# Patient Record
Sex: Male | Born: 1960 | Race: White | Hispanic: No | Marital: Married | Smoking: Current every day smoker
Health system: Southern US, Community
[De-identification: ages and names within clinical notes are randomized; demographics above are authoritative.]

## PROBLEM LIST (undated history)

## (undated) DIAGNOSIS — I739 Peripheral vascular disease, unspecified: Secondary | ICD-10-CM

## (undated) DIAGNOSIS — I2510 Atherosclerotic heart disease of native coronary artery without angina pectoris: Secondary | ICD-10-CM

## (undated) DIAGNOSIS — J449 Chronic obstructive pulmonary disease, unspecified: Secondary | ICD-10-CM

## (undated) DIAGNOSIS — E119 Type 2 diabetes mellitus without complications: Secondary | ICD-10-CM

## (undated) DIAGNOSIS — Z951 Presence of aortocoronary bypass graft: Secondary | ICD-10-CM

## (undated) DIAGNOSIS — J189 Pneumonia, unspecified organism: Secondary | ICD-10-CM

## (undated) DIAGNOSIS — D649 Anemia, unspecified: Secondary | ICD-10-CM

## (undated) DIAGNOSIS — Z789 Other specified health status: Secondary | ICD-10-CM

## (undated) DIAGNOSIS — I219 Acute myocardial infarction, unspecified: Secondary | ICD-10-CM

## (undated) DIAGNOSIS — K259 Gastric ulcer, unspecified as acute or chronic, without hemorrhage or perforation: Secondary | ICD-10-CM

## (undated) DIAGNOSIS — E785 Hyperlipidemia, unspecified: Secondary | ICD-10-CM

## (undated) DIAGNOSIS — M1990 Unspecified osteoarthritis, unspecified site: Secondary | ICD-10-CM

## (undated) DIAGNOSIS — I10 Essential (primary) hypertension: Secondary | ICD-10-CM

## (undated) DIAGNOSIS — I499 Cardiac arrhythmia, unspecified: Secondary | ICD-10-CM

## (undated) DIAGNOSIS — R291 Meningismus: Secondary | ICD-10-CM

## (undated) DIAGNOSIS — N183 Chronic kidney disease, stage 3 unspecified: Secondary | ICD-10-CM

## (undated) DIAGNOSIS — I890 Lymphedema, not elsewhere classified: Secondary | ICD-10-CM

## (undated) DIAGNOSIS — I255 Ischemic cardiomyopathy: Secondary | ICD-10-CM

## (undated) DIAGNOSIS — G709 Myoneural disorder, unspecified: Secondary | ICD-10-CM

## (undated) DIAGNOSIS — N184 Chronic kidney disease, stage 4 (severe): Secondary | ICD-10-CM

## (undated) DIAGNOSIS — G629 Polyneuropathy, unspecified: Secondary | ICD-10-CM

## (undated) DIAGNOSIS — G43909 Migraine, unspecified, not intractable, without status migrainosus: Secondary | ICD-10-CM

## (undated) DIAGNOSIS — G4730 Sleep apnea, unspecified: Secondary | ICD-10-CM

## (undated) DIAGNOSIS — K635 Polyp of colon: Secondary | ICD-10-CM

## (undated) DIAGNOSIS — H3500 Unspecified background retinopathy: Secondary | ICD-10-CM

## (undated) DIAGNOSIS — I5020 Unspecified systolic (congestive) heart failure: Secondary | ICD-10-CM

## (undated) DIAGNOSIS — K219 Gastro-esophageal reflux disease without esophagitis: Secondary | ICD-10-CM

## (undated) DIAGNOSIS — I509 Heart failure, unspecified: Secondary | ICD-10-CM

## (undated) DIAGNOSIS — K859 Acute pancreatitis without necrosis or infection, unspecified: Secondary | ICD-10-CM

## (undated) DIAGNOSIS — N189 Chronic kidney disease, unspecified: Secondary | ICD-10-CM

## (undated) DIAGNOSIS — E109 Type 1 diabetes mellitus without complications: Secondary | ICD-10-CM

## (undated) DIAGNOSIS — E860 Dehydration: Secondary | ICD-10-CM

## (undated) HISTORY — PX: WRIST SURGERY: SHX841

## (undated) HISTORY — DX: Acute pancreatitis without necrosis or infection, unspecified: K85.90

## (undated) HISTORY — PX: BYPASS, CAROTID-SUBCLAVIAN: SHX3361

## (undated) HISTORY — DX: Dehydration: E86.0

## (undated) HISTORY — PX: EYE SURGERY: SHX253

## (undated) HISTORY — DX: Chronic kidney disease, unspecified: N18.9

## (undated) HISTORY — DX: Meningismus: R29.1

## (undated) HISTORY — DX: Migraine, unspecified, not intractable, without status migrainosus: G43.909

## (undated) HISTORY — PX: CARDIAC CATHETERIZATION: SHX172

## (undated) HISTORY — DX: Sleep apnea, unspecified: G47.30

## (undated) HISTORY — PX: BONE GRAFT: SHX377

## (undated) HISTORY — DX: Type 1 diabetes mellitus without complications: E10.9

## (undated) HISTORY — DX: Polyneuropathy, unspecified: G62.9

## (undated) HISTORY — DX: Unspecified systolic (congestive) heart failure: I50.20

## (undated) HISTORY — DX: Type 2 diabetes mellitus without complications: E11.9

## (undated) HISTORY — DX: Essential (primary) hypertension: I10

## (undated) HISTORY — DX: Atherosclerotic heart disease of native coronary artery without angina pectoris: I25.10

## (undated) HISTORY — DX: Cardiac arrhythmia, unspecified: I49.9

## (undated) HISTORY — DX: Hyperlipidemia, unspecified: E78.5

## (undated) HISTORY — DX: Unspecified osteoarthritis, unspecified site: M19.90

## (undated) HISTORY — DX: Gastric ulcer, unspecified as acute or chronic, without hemorrhage or perforation: K25.9

## (undated) HISTORY — DX: Heart failure, unspecified: I50.9

## (undated) HISTORY — DX: Lymphedema, not elsewhere classified: I89.0

## (undated) HISTORY — DX: Peripheral vascular disease, unspecified: I73.9

## (undated) HISTORY — DX: Unspecified background retinopathy: H35.00

## (undated) HISTORY — DX: Chronic kidney disease, stage 3 unspecified: N18.30

## (undated) HISTORY — PX: COLONOSCOPY: SHX174

## (undated) HISTORY — DX: Gastro-esophageal reflux disease without esophagitis: K21.9

## (undated) HISTORY — PX: CATARACT EXTRACTION: SUR2

## (undated) HISTORY — PX: CARDIAC SURGERY: SHX584

## (undated) HISTORY — DX: Ischemic cardiomyopathy: I25.5

## (undated) HISTORY — DX: Polyp of colon: K63.5

## (undated) HISTORY — PX: OTHER SURGICAL HISTORY: SHX169

---
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
## 2009-10-21 DIAGNOSIS — I2510 Atherosclerotic heart disease of native coronary artery without angina pectoris: Secondary | ICD-10-CM | POA: Insufficient documentation

## 2010-11-22 DIAGNOSIS — G4730 Sleep apnea, unspecified: Secondary | ICD-10-CM | POA: Insufficient documentation

## 2011-02-22 DIAGNOSIS — E782 Mixed hyperlipidemia: Secondary | ICD-10-CM | POA: Insufficient documentation

## 2011-02-22 DIAGNOSIS — G43909 Migraine, unspecified, not intractable, without status migrainosus: Secondary | ICD-10-CM | POA: Insufficient documentation

## 2011-02-22 DIAGNOSIS — E1142 Type 2 diabetes mellitus with diabetic polyneuropathy: Secondary | ICD-10-CM | POA: Insufficient documentation

## 2011-02-22 DIAGNOSIS — F418 Other specified anxiety disorders: Secondary | ICD-10-CM | POA: Insufficient documentation

## 2011-02-22 DIAGNOSIS — E1042 Type 1 diabetes mellitus with diabetic polyneuropathy: Secondary | ICD-10-CM | POA: Insufficient documentation

## 2011-02-22 DIAGNOSIS — E7849 Other hyperlipidemia: Secondary | ICD-10-CM | POA: Insufficient documentation

## 2011-05-28 HISTORY — PX: CARDIAC CATHETERIZATION: SHX172

## 2011-10-06 DIAGNOSIS — L0390 Cellulitis, unspecified: Secondary | ICD-10-CM | POA: Insufficient documentation

## 2011-10-14 DIAGNOSIS — M71129 Other infective bursitis, unspecified elbow: Secondary | ICD-10-CM | POA: Insufficient documentation

## 2012-12-09 DIAGNOSIS — E11621 Type 2 diabetes mellitus with foot ulcer: Secondary | ICD-10-CM | POA: Insufficient documentation

## 2012-12-22 ENCOUNTER — Other Ambulatory Visit: Payer: Self-pay

## 2012-12-22 LAB — CBC WITH DIFFERENTIAL/PLATELET
Eosinophil #: 0.8 10*3/uL — ABNORMAL HIGH (ref 0.0–0.7)
Eosinophil %: 9.1 %
HCT: 42.5 % (ref 40.0–52.0)
Lymphocyte %: 32.2 %
Monocyte %: 6.6 %
Neutrophil #: 4.2 10*3/uL (ref 1.4–6.5)
Platelet: 243 10*3/uL (ref 150–440)
RDW: 13.7 % (ref 11.5–14.5)
WBC: 8.4 10*3/uL (ref 3.8–10.6)

## 2012-12-22 LAB — CK: CK, Total: 168 U/L (ref 35–232)

## 2012-12-22 LAB — CREATININE, SERUM
Creatinine: 1.2 mg/dL (ref 0.60–1.30)
EGFR (African American): >60

## 2012-12-22 LAB — BUN: BUN: 20 mg/dL — ABNORMAL HIGH (ref 7–18)

## 2012-12-29 ENCOUNTER — Other Ambulatory Visit: Payer: Self-pay

## 2012-12-29 LAB — CBC WITH DIFFERENTIAL/PLATELET
Basophil %: 1.7 %
Eosinophil #: 0.6 10*3/uL (ref 0.0–0.7)
Eosinophil %: 5.8 %
HGB: 15.1 g/dL (ref 13.0–18.0)
Lymphocyte #: 3.6 10*3/uL (ref 1.0–3.6)
Lymphocyte %: 36.7 %
MCH: 32.1 pg (ref 26.0–34.0)
MCV: 91 fL (ref 80–100)
Monocyte %: 7.6 %
Neutrophil %: 48.2 %
Platelet: 260 10*3/uL (ref 150–440)
RBC: 4.7 10*6/uL (ref 4.40–5.90)
RDW: 13.7 % (ref 11.5–14.5)
WBC: 9.9 10*3/uL (ref 3.8–10.6)

## 2012-12-29 LAB — BUN: BUN: 26 mg/dL — ABNORMAL HIGH (ref 7–18)

## 2012-12-29 LAB — CREATININE, SERUM: Creatinine: 0.97 mg/dL (ref 0.60–1.30)

## 2012-12-29 LAB — CK: CK, Total: 206 U/L (ref 35–232)

## 2016-05-27 HISTORY — PX: CORONARY ARTERY BYPASS GRAFT: SHX141

## 2016-05-27 HISTORY — PX: CARDIAC CATHETERIZATION: SHX172

## 2016-10-28 HISTORY — PX: PERIPHERAL VASCULAR CATHETERIZATION: SHX172C

## 2016-11-15 DIAGNOSIS — I2510 Atherosclerotic heart disease of native coronary artery without angina pectoris: Secondary | ICD-10-CM | POA: Insufficient documentation

## 2017-10-22 ENCOUNTER — Ambulatory Visit (INDEPENDENT_AMBULATORY_CARE_PROVIDER_SITE_OTHER): Payer: Self-pay | Admitting: Cardiology

## 2017-10-24 ENCOUNTER — Other Ambulatory Visit: Payer: Self-pay | Admitting: Cardiology

## 2017-10-24 DIAGNOSIS — I255 Ischemic cardiomyopathy: Secondary | ICD-10-CM

## 2017-11-03 ENCOUNTER — Ambulatory Visit
Admission: RE | Admit: 2017-11-03 | Discharge: 2017-11-03 | Disposition: A | Discharge status: Home / Self Care | Payer: No Typology Code available for payment source | Source: Ambulatory Visit | Attending: Cardiology | Admitting: Cardiology

## 2017-11-03 ENCOUNTER — Other Ambulatory Visit (INDEPENDENT_AMBULATORY_CARE_PROVIDER_SITE_OTHER): Payer: Self-pay

## 2017-11-03 DIAGNOSIS — I255 Ischemic cardiomyopathy: Secondary | ICD-10-CM | POA: Insufficient documentation

## 2017-11-03 DIAGNOSIS — I5189 Other ill-defined heart diseases: Secondary | ICD-10-CM | POA: Insufficient documentation

## 2017-11-03 DIAGNOSIS — I2510 Atherosclerotic heart disease of native coronary artery without angina pectoris: Secondary | ICD-10-CM | POA: Insufficient documentation

## 2017-11-04 ENCOUNTER — Other Ambulatory Visit (INDEPENDENT_AMBULATORY_CARE_PROVIDER_SITE_OTHER): Payer: Self-pay | Admitting: Cardiology

## 2017-11-12 ENCOUNTER — Ambulatory Visit (INDEPENDENT_AMBULATORY_CARE_PROVIDER_SITE_OTHER): Payer: Self-pay | Admitting: Physician Assistant

## 2017-11-12 LAB — VAHRT HISTORIC LVEF: Ejection Fraction: 46 %

## 2017-11-14 ENCOUNTER — Encounter (INDEPENDENT_AMBULATORY_CARE_PROVIDER_SITE_OTHER): Payer: Self-pay | Admitting: Student in an Organized Health Care Education/Training Program

## 2017-11-14 ENCOUNTER — Ambulatory Visit (INDEPENDENT_AMBULATORY_CARE_PROVIDER_SITE_OTHER)
Payer: No Typology Code available for payment source | Admitting: Student in an Organized Health Care Education/Training Program

## 2017-11-14 VITALS — BP 131/84 | HR 83 | Temp 97.6°F | Resp 13 | Ht 72.5 in | Wt 191.2 lb

## 2017-11-14 DIAGNOSIS — I2583 Coronary atherosclerosis due to lipid rich plaque: Secondary | ICD-10-CM

## 2017-11-14 DIAGNOSIS — I2510 Atherosclerotic heart disease of native coronary artery without angina pectoris: Secondary | ICD-10-CM

## 2017-11-14 DIAGNOSIS — E7849 Other hyperlipidemia: Secondary | ICD-10-CM

## 2017-11-14 DIAGNOSIS — Z8719 Personal history of other diseases of the digestive system: Secondary | ICD-10-CM

## 2017-11-14 DIAGNOSIS — E1142 Type 2 diabetes mellitus with diabetic polyneuropathy: Secondary | ICD-10-CM

## 2017-11-14 DIAGNOSIS — Z794 Long term (current) use of insulin: Secondary | ICD-10-CM

## 2017-11-14 DIAGNOSIS — T466X5A Adverse effect of antihyperlipidemic and antiarteriosclerotic drugs, initial encounter: Secondary | ICD-10-CM

## 2017-11-14 DIAGNOSIS — M609 Myositis, unspecified: Secondary | ICD-10-CM

## 2017-11-14 LAB — POCT GLYCOSYLATED HEMOGLOBIN (HGB A1C): POCT Hgb A1C: 12.2 % — AB (ref 3.9–5.9)

## 2017-11-14 MED ORDER — INSULIN DETEMIR 100 UNIT/ML SC SOPN
PEN_INJECTOR | SUBCUTANEOUS | 1 refills | Status: DC
Start: 2017-11-14 — End: 2018-04-28

## 2017-11-14 MED ORDER — INSULIN PEN NEEDLE 31G X 8 MM MISC
3 refills | Status: AC
Start: 2017-11-14 — End: ?

## 2017-11-14 MED ORDER — INSULIN LISPRO 100 UNIT/ML SC SOPN
PEN_INJECTOR | SUBCUTANEOUS | 1 refills | Status: DC
Start: 2017-11-14 — End: 2018-08-03

## 2017-11-14 NOTE — Progress Notes (Signed)
Have you seen any specialists/other providers since your last visit with us?    No    Arm preference verified?   Yes    The patient is due for shingles vaccine

## 2017-11-14 NOTE — Progress Notes (Signed)
Chief Complaint   Patient presents with   . Diabetes     pt here for refill his medicine    . Establish Care     pt here for meet New PCP     57 yo Caucasian M , h/o CAD, poorly controlled DM, OSA, Lower ext neuropathy/pancreatitis who just moved from [place] and is here to establish care     Patient is here to FU on chronic medical problems, including    Diabetes Mellitus Type II, Follow-up:   Poorly controlled    Current symptoms/problems include neuropathy   Known diabetic complications: CAD ,   neuropathy , poor tol to neurontin , Lyrica     Current diabetic medications include   levemir 50 units qhs, 40 units qam, Humalog per SSI 2-20 units   He is not able to inc his levemir due to Hypoglycemic episodes he had in the past : 39 while on LEVEMIr 70 units bid, none on the current regimen   Has not tol oral agents in the past     Eye exam current (within one year): yes  Weight trend: decreasing steadily  Prior visit with dietician: yes -   Current diet: in general, a "healthy" diet to the point where he was 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   Yes   entresto         Hypertension: occasionally compliant    - The patient feels this issue is  controlled.  - The patient has been monitoring blood pressure.  - The patient has  been adhering to the recommended no-added-salt diet.  - The patient has not been compliant to the recommended exercise program.  - The patient is  tolerating the current medication regimen well.  - The patient denies orthostasis.  - The patient denies chest pain.  - The patient denies dyspnea.  - The patient c/o long standing lower ext  Edema.  Seen by is cardiologist and recent labs : CMP/lipid were done,  none available during this visit     Hx HLD  Compliant with cholesterol medication  Myalgias,  Muscle aches , bodyaches on all statins , he refuses to start statin   Now taking Zetia   Compliant with  low saturated and low carbohydrate Diet  No hx abnormal liver test related to statin therapy           Vitals:    11/14/17 1129   BP: 131/84   Pulse: 83   Resp: 13   Temp: 97.6 F (36.4 C)   SpO2: 98%   Weight: 86.7 kg (191 lb 3.2 oz)   Height: 1.842 m (6' 0.5")       Patient Active Problem List   Diagnosis   . Coronary atherosclerosis       Past Medical History:   Diagnosis Date   . Dehydration    . Dupre's syndrome    . Migraine    . Neuropathy    . Pancreatitis    . Sleep apnea        Past Surgical History:   Procedure Laterality Date   . BONE GRAFT     . BYPASS, CAROTID-SUBCLAVIAN     . CARDIAC CATHETERIZATION     . EYE SURGERY     . WRIST SURGERY         Social History     Social History   .  Marital status: Married     Spouse name: N/A   . Number of children: N/A   . Years of education: N/A     Occupational History   . Not on file.     Social History Main Topics   . Smoking status: Former Games developer   . Smokeless tobacco: Never Used      Comment: quit 7 years ago    . Alcohol use 0.6 oz/week     1 Cans of beer per week      Comment: 1 bear q1month    . Drug use: No   . Sexual activity: No     Other Topics Concern   . Not on file     Social History Narrative   . No narrative on file       No family history on file.    Allergies   Allergen Reactions   . Statins      Other reaction(s): Arthralgia (Joint Pain)   . Lyrica [Pregabalin]      POOR TOL    . Neurontin [Gabapentin]      od        Current Outpatient Prescriptions   Medication Sig Dispense Refill   . carvedilol (COREG) 3.125 MG tablet carvedilol 3.125 mg tablet     . ezetimibe (ZETIA) 10 MG tablet ezetimibe 10 mg tablet     . furosemide (LASIX) 20 MG tablet furosemide 20 mg tablet   TAKE 1 TABLET BY MOUTH EVERY DAY     . insulin detemir (LEVEMIR FLEXTOUCH) 100 UNIT/ML injection pen Levemir FlexTouch U-100 Insulin 100 unit/mL (3 mL) subcutaneous pen     . insulin lispro (HUMALOG KWIKPEN) 100 UNIT/ML injection pen Humalog KwikPen (U-100) Insulin 100 unit/mL  subcutaneous     . insulin lispro (HUMALOG) 100 UNIT/ML injection Humalog U-100 Insulin 100 unit/mL subcutaneous solution   INJECT 40 UNITS BEFORE EACH MEAL     . sacubitril-valsartan (ENTRESTO) 24-26 MG Tab per tablet Entresto 24 mg-26 mg tablet       No current facility-administered medications for this visit.        Constitutional: negative for fatigue, fever, chills   Eyes: negative for vision problems   Ears, nose, mouth, throat, and face: negative for nose bleeds, nasal congestion   Respiratory: negative for SOB, cough , wheezing   Cardiovascular: negative for CP, dizziness, palpitations, swelling , orthopnea   Gastrointestinal: negative for diarrhea , nausea, vomiting, constipation   Genitourinary:negative for dysuria, frequency and hematuria   Integument/breast: negative for nodule   Hematologic/lymphatic: negative for enlarged LAD,   Musculoskeletal +myalgia, joint pain and swelling   Neurological: negative for HA, dizziness,+ tingling ,   Behavioral/Psych: negative for anxiety, depression    Other ROS were reviewed and were neg       Vitals:    11/14/17 1129   BP: 131/84   Pulse: 83   Resp: 13   Temp: 97.6 F (36.4 C)   SpO2: 98%       CONSTITUTIONAL Patient is afebrile, Vital signs reviewed. Hypertensive.   HEAD Atraumatic, Normocephallc.   EYES Eyes are normal to inspection, PERRL, No discharge from eyes,   ENT Ears normal to inspection, Nose examination normal, Posterior pharynx normal.   NECK Normal ROM, No jugular venous distention, No meningeal signs.   RESPIRATORY CHEST Chest is nontender, Breath sounds normal.   CARDIOVASCULAR RRR, Heart sounds normal, Normal S1 S2.   ABDOMEN Abdomen is nontender, No pulsatile masses, No other masses,  Bowel sounds normal, No distension, No peritoneal signs.   BACK There is no CVA Tenderness, There is no tenderness to palpation.   UPPER EXTREMITY Inspection normal, No cyanosis.   LOWER EXTREMITY +2 + pitting edema       LYMPHATIC No adenopathy in neck.    PSYCHIATRIC Oriented X 3, Normal affect. Normal insight.      A/P    1. Type 2 diabetes mellitus with diabetic polyneuropathy, with long-term current use of insulin  POCT Glycosylated Hemoglobin (A1C)    insulin detemir (LEVEMIR FLEXTOUCH) 100 UNIT/ML injection pen    insulin lispro (HUMALOG KWIKPEN) 100 UNIT/ML injection pen    Ambulatory referral to Endocrinology    Insulin Pen Needle 31G X 8 MM Misc    CANCELED: Microalbumin, Random Urine   2. Coronary artery disease due to lipid rich plaque     3. Statin-induced myositis     4. Diabetic peripheral neuropathy     5. Other hyperlipidemia     6. History of pancreatitis         See endocrin  See ophto   See cardiology   Get labs to be reviewed     meds d/w Pt         Risk & Benefits of the new medication(s) were explained to the patient (and family) who verbalized understanding & agreed to the treatment plan. Patient (family) encouraged to contact me/clinical staff with any questions/concerns    Patient counseled to watch concentrated sweets and carbohydrates. Patient also counseled to exercise at least three times a week. Patient counseled about routine foot care  Patient counseled to eat a low fat, low cholesterol diet and to exercise at least three times a week for at least 30 minutes.  Patient counseled to eat a low salt diet(DASH diet)  Followup in three months

## 2017-11-16 DIAGNOSIS — E1142 Type 2 diabetes mellitus with diabetic polyneuropathy: Secondary | ICD-10-CM | POA: Insufficient documentation

## 2017-11-16 DIAGNOSIS — Z794 Long term (current) use of insulin: Secondary | ICD-10-CM | POA: Insufficient documentation

## 2017-11-16 DIAGNOSIS — Z8719 Personal history of other diseases of the digestive system: Secondary | ICD-10-CM | POA: Insufficient documentation

## 2017-11-16 DIAGNOSIS — M609 Myositis, unspecified: Secondary | ICD-10-CM | POA: Insufficient documentation

## 2017-11-16 DIAGNOSIS — E7849 Other hyperlipidemia: Secondary | ICD-10-CM | POA: Insufficient documentation

## 2017-11-17 ENCOUNTER — Ambulatory Visit (INDEPENDENT_AMBULATORY_CARE_PROVIDER_SITE_OTHER)
Payer: No Typology Code available for payment source | Admitting: Student in an Organized Health Care Education/Training Program

## 2017-11-17 ENCOUNTER — Encounter (INDEPENDENT_AMBULATORY_CARE_PROVIDER_SITE_OTHER): Payer: Self-pay | Admitting: Student in an Organized Health Care Education/Training Program

## 2017-11-17 ENCOUNTER — Ambulatory Visit
Admission: RE | Admit: 2017-11-17 | Discharge: 2017-11-17 | Disposition: A | Discharge status: Home / Self Care | Payer: No Typology Code available for payment source | Source: Ambulatory Visit | Attending: Student in an Organized Health Care Education/Training Program | Admitting: Student in an Organized Health Care Education/Training Program

## 2017-11-17 ENCOUNTER — Other Ambulatory Visit (INDEPENDENT_AMBULATORY_CARE_PROVIDER_SITE_OTHER): Payer: Self-pay | Admitting: Student in an Organized Health Care Education/Training Program

## 2017-11-17 ENCOUNTER — Telehealth (INDEPENDENT_AMBULATORY_CARE_PROVIDER_SITE_OTHER): Payer: Self-pay | Admitting: Student in an Organized Health Care Education/Training Program

## 2017-11-17 VITALS — BP 111/68 | HR 80 | Temp 98.3°F | Resp 12 | Wt 193.2 lb

## 2017-11-17 DIAGNOSIS — R1032 Left lower quadrant pain: Secondary | ICD-10-CM

## 2017-11-17 DIAGNOSIS — R590 Localized enlarged lymph nodes: Secondary | ICD-10-CM | POA: Insufficient documentation

## 2017-11-17 DIAGNOSIS — S76012A Strain of muscle, fascia and tendon of left hip, initial encounter: Secondary | ICD-10-CM

## 2017-11-17 NOTE — Telephone Encounter (Signed)
Patient requested a same day appointment with Dr. Salina April for symptoms of a hernia; stated he is experiencing severe pain and prefers to see pcp today. Please advise.    Patient can be reached at [phone]

## 2017-11-17 NOTE — Telephone Encounter (Signed)
CALLED him and left VM  Called his wife and explained the neg hernia U/S, may need to proceed with PT and re-assess    PT order is ready for pick up       [employer] Merrily Tegeler

## 2017-11-17 NOTE — Patient Instructions (Signed)
Hernia (Adult)    A hernia can happen when there is a weakness or defect in the wall of the abdomen or groin.Intestines or nearby tissues may move from their usual location and push through the weakness in the wall. This can cause a hernia (bulge) you may see or feel.  Causes and risk factors  A hernia may be present at birth. Or it may be caused by the wear and tear of daily living. Certain factors can make a hernia more likely. These can include:   Heavy lifting   Straining, whether from lifting, movement, or constipation   Chronic cough   Injury to the abdominal wall   Excess weight   Pregnancy   Prior surgery   Older age   Family history of hernia  Symptoms  Symptoms of a hernia may come on suddenly. Or they may appear slowly over time. Some common symptoms include:   Bulge in the groin area, around the navel, or in the scrotum (the bulge may get bigger when you stand and go away when you lie down)   Pain or pressure around the bulge   Pain during activities such as lifting, coughing, or sneezing   A feeling of weakness or pressure in the groin   Pain or swelling in the scrotum  Types of hernias  There are different types of hernia. The type you have depends on its location:   Inguinal. This type is in the groin or scrotum. It is more common in men. But, women can get this hernia, too.   Femoral. This type is in the groin, upper thigh (where the leg bends), or labia. It is more common in women.   Ventral. This type is in the abdominal wall.   Umbilical. This type occurs around the navel (belly button).   Incisional. This type occurs at the site of a previous surgery.  The condition of the hernia can help determine how urgently it needs to be treated.   Reducible. It goes back in by itself, or it can be pushed back in.   Irreducible. It can't be pushed back in.   Incarcerated/strangulated. The intestine is trapped (incarcerated). If this happens, you won't be able to push the bulge  back in. If the incarcerated hernia isn't treated, it may become strangulated. This means the area loses blood supply and the tissue may die.This requires emergency surgery. You need treatment right away.  In most cases, a hernia will not heal on its own.You may need surgery to repair the defect in the abdominal wall or groin. You'll be told more about surgery, if needed.  If your symptoms are not severe, treatment may sometimes be delayed. In such cases, you will need regular follow-up visits with the provider. You'll be asked to keep track of your symptoms and to watch for signs of more serious problems. You may also be given guidelines similar to the home care instructions below.  Home care  To help keep a hernia from getting worse, you may be advised to:   Avoid heavy lifting and straining as directed.   Take steps to prevent constipation, such as eating more fiber and drinking more water. This may help reduce straining that can occur when having a bowel movement. Reducing straining may help keep your symptoms from getting worse.   Maintain a healthy weight or lose excess weight. This can help reduce strain on abdominal muscles and tissues.   Stop smoking. This can help prevent coughing that may   also strain abdominal muscles and tissues.  Follow-up care  Follow up with your healthcare provider, or as directed.If imaging tests were done, they will be reviewed a doctor. You will be told the results and any new findings that may affect your care.  When to seek medical advice  Call your healthcare provider right away if any of these occur:   Hernia hardens, swells, or grows larger   Hernia can no longer be pushed back in   Pain moves to the lower right abdomen (just below the waistline), or spreads to the back  Call 911  Call 911if any of these occur:   Severe pain, redness, or tenderness in the area near the hernia   Pain worsens quickly and doesn't get better   Inability to have a bowel movement or  pass gas   Fever of 100.4F (38C) or higher, or as directed by your healthcare provider  Date Last Reviewed: 07/25/2016   2000-2018 [employer] [address], [place] [address]. All rights reserved. This information is not intended as a substitute for professional medical care. Always follow your healthcare professional's instructions.

## 2017-11-17 NOTE — Telephone Encounter (Signed)
Can we see him today at 11. 15 am ?

## 2017-11-17 NOTE — Progress Notes (Signed)
Chief Complaint   Patient presents with   . Hernia     aroung Groin area          [name] is a 57 y.o. male who presents for evaluation of  Above problem   57 yo , . H/o uncontrolled DM /CAD/OSA who  C/O Abdominal Pain that started few days ago, Located in the L Groin , Sharp pain, intermittent cramps, not associated with Nausea and vomiting.   bo C/O Diarrhea/Constipation,  No Blood Melena in the stool  Not Related to Food, No fever or chills  No recent trip   No urinary Sx   No trauma    Pain might get worse with walking   Pain might have been triggered by lifting after recent move     Past Medical History:   Diagnosis Date   . Dehydration    . Dupre's syndrome    . Migraine    . Neuropathy    . Pancreatitis    . Sleep apnea       Past Surgical History:   Procedure Laterality Date   . BONE GRAFT     . BYPASS, CAROTID-SUBCLAVIAN     . CARDIAC CATHETERIZATION     . EYE SURGERY     . WRIST SURGERY          (Not in a hospital admission)  Current/Home Medications    CARVEDILOL (COREG) 3.125 MG TABLET    carvedilol 3.125 mg tablet    EZETIMIBE (ZETIA) 10 MG TABLET    ezetimibe 10 mg tablet    FUROSEMIDE (LASIX) 20 MG TABLET    furosemide 20 mg tablet   TAKE 1 TABLET BY MOUTH EVERY DAY    INSULIN DETEMIR (LEVEMIR FLEXTOUCH) 100 UNIT/ML INJECTION PEN    Levemir FlexTouch U-100 Insulin 100 unit/mL (3 mL) subcutaneous pen : 50  Units SC bid    INSULIN LISPRO (HUMALOG KWIKPEN) 100 UNIT/ML INJECTION PEN    Humalog KwikPen (U-100) Insulin 100 unit/mL subcutaneous per SSI 1-20 units premeals    INSULIN LISPRO (HUMALOG) 100 UNIT/ML INJECTION    Humalog U-100 Insulin 100 unit/mL subcutaneous solution   INJECT 40 UNITS BEFORE EACH MEAL    INSULIN PEN NEEDLE 31G X 8 MM MISC    Use with insulin pen    SACUBITRIL-VALSARTAN (ENTRESTO) 24-26 MG TAB PER TABLET    Entresto 24 mg-26 mg tablet     Allergies   Allergen Reactions   . Statins      Other reaction(s): Arthralgia (Joint Pain)   . Lyrica [Pregabalin]      POOR TOL    .  Neurontin [Gabapentin]      od       Social History   Substance Use Topics   . Smoking status: Former Games developer   . Smokeless tobacco: Never Used      Comment: quit 7 years ago    . Alcohol use 0.6 oz/week     1 Cans of beer per week      Comment: 1 bear q35month       History reviewed. No pertinent family history.       Review of Systems - General ROS: negative for - chills, fatigue, fever or malaise  Hematological and Lymphatic ROS: negative for - fatigue, jaundice or weight loss  Gastrointestinal ROS: positive for - abdominal pain  Genito-Urinary ROS: no dysuria, trouble voiding, or hematuria  Other ROS were reviewed and were neg  Vitals:    11/17/17 1317   BP: 111/68   Pulse: 80   Resp: 12   Temp: 98.3 F (36.8 C)   SpO2: 98%       Physical Examination: General appearance - alert, well appearing, and in no distress and oriented to person, place, and time  Eyes - pupils equal and reactive, extraocular eye movements intact, no icterus  Mouth - mucous membranes moist, pharynx normal without lesions, tonsils normal and no thrush  Chest - clear to auscultation, no wheezes, rales or rhonchi, symmetric air entry  Heart - normal rate, regular rhythm, normal S1, S2, no murmurs, rubs, clicks or gallops  Abdomen - soft, nontender, nondistended, no masses or organomegaly  No hernia appreciated over the L inguinal ring   Extremities - peripheral pulses normal, no pedal edema, no clubbing or cyanosis      A/P   1. Left inguinal pain  US Pelvis Limited         Could be muscular     U/A : not done, Pt is not able to provide a sample   Body mass index is 25.84 kg/m.  HIgh BMI Follow Up   BMI Follow Up Care Plan Documented   Encouragement to Exercise       Risk & Benefits of the new medication(s) were explained to the patient who verbalized understanding & agreed to the treatment plan    Phylliss Bob MD

## 2017-11-17 NOTE — Progress Notes (Signed)
Have you seen any specialists/other providers since your last visit with us?    No    Arm preference verified?   Yes    The patient is due for eye exam, foot exam, shingles vaccine and [facility], HEP C

## 2017-11-17 NOTE — Telephone Encounter (Signed)
Pt informed he is ok to wait. Can you put him on schedule please around 1.30 pm     Thanks

## 2017-11-17 NOTE — Telephone Encounter (Signed)
WE CAN  Waiting time    [employer] Xane Amsden

## 2017-11-18 ENCOUNTER — Other Ambulatory Visit (INDEPENDENT_AMBULATORY_CARE_PROVIDER_SITE_OTHER): Payer: Self-pay | Admitting: Student in an Organized Health Care Education/Training Program

## 2017-11-18 ENCOUNTER — Other Ambulatory Visit (FREE_STANDING_LABORATORY_FACILITY): Payer: No Typology Code available for payment source

## 2017-11-18 DIAGNOSIS — R1032 Left lower quadrant pain: Secondary | ICD-10-CM

## 2017-11-18 LAB — URINE MICROSCOPIC

## 2017-11-18 LAB — URINALYSIS
Bilirubin, UA: NEGATIVE
Glucose, UA: >=1000 — AB
Ketones UA: NEGATIVE
Leukocyte Esterase, UA: NEGATIVE
Nitrite, UA: NEGATIVE
Protein, UR: 300 — AB
Specific Gravity UA: 1.024 (ref 1.001–1.035)
Urine pH: 6.5 (ref 5.0–8.0)
Urobilinogen, UA: 0.2

## 2017-11-25 ENCOUNTER — Inpatient Hospital Stay
Payer: No Typology Code available for payment source | Attending: Student in an Organized Health Care Education/Training Program | Admitting: Rehabilitative and Restorative Service Providers"

## 2017-11-25 DIAGNOSIS — S76012D Strain of muscle, fascia and tendon of left hip, subsequent encounter: Secondary | ICD-10-CM | POA: Insufficient documentation

## 2017-11-25 NOTE — PT/OT Therapy Note (Addendum)
Name: [name] "[name]" Age: 57 y.o.   Referring Physician: Noel Christmas*   Date of Injury: 11/04/2017  Date Care Plan Established/Reviewed: 11/25/2017  Date Treatment Started: 11/25/2017  Visit Count: 1   Diagnosis:   1. Hip strain, left, subsequent encounter                             ---      ---   Total Time   Timed Minutes  10 minutes   Untimed Minutes  25 minutes   Total Time  35 minutes      SUBJECTIVE:  Occupation: IT   Mechanism of Injury: 3 wks ago L hip pain insidious onset which might have been aggravated by lifting dirt. Pain has significantly improved since then. Only experiences pain with sitting or lying down.    Has peripheral neuropathies (no feeling from knees distally)  Tests: none    Outcome Measure:  OUTCOMES IE    FOTO 48      OBJECTIVE:  Vitals: BP 125/70, HR 85    Observation/Posture/Gait/Integumentary:  Observation of posture: flexed trunk  Gait: WFL  Integumentary: No wound, lesion or rash noted  IE Palpation: Pain to palpation: L hip flexors     Range of Motion: (degrees)    Initial Uninvolved  AROM Hip Initial Involved AROM L    102 Flexion 103     Extension      Abduction     25 Internal Rotation 23    37 External Rotation 40    (blank fields were intentionally left blank)    IE Knee AROM: WFL    Flexibility IE   Hamstrings Restricted Bilateral   Quadriceps Restricted Bilateral     Initial   Uninvolved LE Strength  MMT /5 Initial  Involved    5 Hip Flexion 5    5 Hip Extension 5    5 Hip Abduction 5    - Hip Adduction 5     Hip Internal Rot     5 Hip External Rot 5    5 Quadriceps 5    5 Hamstrings 5    (blank fields were intentionally left blank)    Balance:    Balance IE    R SLS 1 sec.    L SLS 1 sec.        Special Tests/Neurological Screen:     R L   FABER (+) (+)   Scour NT (-)   [name] Test (-) (-)   Obers Test (-) (-)     Sensation to Light touch: Intact and Absent B feet    Treatment Initial Visit:  Evaluation  Therapeutic exercise with instruction in HEP and  provided patient written and illustrated handout Yes  Manual N/A  Modalities: None  For Next Visit Add ice L hip, STM L hip flexors  Per pt, he cannot lie supine for more than 5-10 minutes due to only having 40-50% functioning heart. Recommend reclined position. Uncontrolled diabetes.        Exercise Flow Sheet    Exercise Specifics              UBE   Standing 5'              3 way hip   B x10, UE support (no hip flex)              C leg press  rockerboard   Taps x10, balance x30" each              Balance machine                 Hip flexor stretch                 HS stretch                 Hip adductor stretch                                                                    Home Exercise Program                 (Initials = supervised exercise by clinician)    [email or web address]  Access Code: [email or web address]   URL: [email or web address]   Date: 11/25/2017   Prepared by: Jayme Cloud     Exercises   Supine Hip Adductor Stretch - 3 sets - 30 seconds hold - 1x daily - 7x weekly   Prone Quadriceps Stretch with Strap - 3 sets - 30 seconds hold - 1x daily - 7x weekly   [name] Stretch on Table - 3 sets - 30 seconds hold - 1x daily - 7x weekly   Standing Hamstring Stretch with Step - 3 sets - 30 hold - 1x daily - 7x weekly   Standing Single Leg Stance with Counter Support - 3 sets - 30 seconds hold - 1x daily - 7x weekly          Goals    Goal 1:  Patient will demonstrate independence in prescribed HEP with proper form, sets and reps for safe discharge to an independent program.       Sessions:  16      Goal 2:  Patient will increase FOTO to 65   Sessions:  16      Goal 3:  Patient will increase B SLS to 10 seconds for decreased fall risk.   Sessions:  16      Goal 4:  Pt will decrease pain to 1/10 with sitting or lying down.    Sessions:  [address], PT

## 2017-11-25 NOTE — Progress Notes (Addendum)
Name: Taevin Mcferran Age: 57 y.o.   Referring Physician: Noel Christmas*   Date of Injury: 11/04/2017  Date Care Plan Established/Reviewed: 11/25/2017  Date Treatment Started: 11/25/2017  Visit Count: 1   Diagnosis:   1. Hip strain, left, subsequent encounter        Subjective     History of Present Illness   Functional Limitations (PLOF): Current: pain with sitting or lying down  PLOF: no difficulty/pain with above                      ---      ---   Total Time   Timed Minutes  10 minutes   Untimed Minutes  25 minutes   Total Time  35 minutes        Assessment   Gram is a 57 y.o. male presenting with L hip pain due to hip flexor strain who requires Physical Therapy for the following:  Impairments: decreased HS flexibility, pain, decreased balance, decreased hip adductor flexibility    Pain located: L hip    Clinical presentation: stable   Barriers to therapy: Peripheral neuropathies B, cardiac issues possibly limiting exercise tolerance, uncontrolled diabetes  Prior Level of Function: Current: pain with sitting or lying down  PLOF: no difficulty/pain with above  Prognosis: good  Plan   Visits per week: 2  Number of Sessions: 16  Direct One on One  16109: Therapeutic Exercise: To Develop Strength and Endurance, ROM and Flexibility  [id number]: Neuromuscular Reeducation  97140: Manual Therapy techniques (mobilization, manipulation, manual traction) (STM hip flexors)  97530: Therapeutic Activities: Dynamic activities to improve functional performance  Dry Needling  Supervised Modalities  97010: Thermal modalities: hot/cold packs  60454: Electrical stimulation    Goals    Goal 1:  Patient will demonstrate independence in prescribed HEP with proper form, sets and reps for safe discharge to an independent program.       Sessions:  16      Goal 2:  Patient will increase FOTO to 65   Sessions:  16      Goal 3:  Patient will increase B SLS to 10 seconds for decreased fall risk.   Sessions:  16      Goal 4:  Pt will  decrease pain to 1/10 with sitting or lying down.    Sessions:  [address] PT

## 2017-11-28 ENCOUNTER — Inpatient Hospital Stay: Payer: No Typology Code available for payment source

## 2017-12-01 ENCOUNTER — Inpatient Hospital Stay
Payer: No Typology Code available for payment source | Attending: Student in an Organized Health Care Education/Training Program | Admitting: Rehabilitative and Restorative Service Providers"

## 2017-12-01 DIAGNOSIS — S76012D Strain of muscle, fascia and tendon of left hip, subsequent encounter: Secondary | ICD-10-CM | POA: Insufficient documentation

## 2017-12-01 NOTE — PT/OT Therapy Note (Signed)
Name: [name] "[name]" Age: 57 y.o.   Referring Physician: Noel Christmas*   Date of Injury: 11/04/2017  Date Care Plan Established/Reviewed: 11/25/2017  Date Treatment Started: 11/25/2017  Visit Count: 2   Diagnosis:   1. Hip strain, left, subsequent encounter        Subjective     Pt reports he was able to do HEP a few times and has noticed a decrease in pain.  0/10 hip pain the last few days, even after moving boxes up and down flights of stairs.                 Treatment     Therapeutic Exercises   Justification: To improve: Flexibility/ROM, Stabilization, Strength  Per flowsheet    VCs for proper form with all new exercises    Neuromuscular Re-Education   Justification: For activation and/or inhibition of target muscle, balance for improved stability on level/unlevel surfaces  Balance exercises per flowsheet    Manual Therapy   Justification: To improve joint and soft tissue mobility and reduce trigger points  Reclined: STM L hip flexors    Modalities   Ice Pack 10 min. Location L hip Position reclined  Therapy Rationale: Decrease Pain       ---      ---   Total Time   Timed Minutes  39 minutes   Total Time  39 minutes        OUTCOMES IE    FOTO 48      OBJECTIVE:    Range of Motion: (degrees)    Initial Uninvolved  AROM Hip Initial Involved AROM L    102 Flexion 103     Extension      Abduction     25 Internal Rotation 23    37 External Rotation 40    (blank fields were intentionally left blank)      Initial   Uninvolved LE Strength  MMT /5 Initial  Involved    5 Hip Flexion 5    5 Hip Extension 5    5 Hip Abduction 5    - Hip Adduction 5     Hip Internal Rot     5 Hip External Rot 5    5 Quadriceps 5    5 Hamstrings 5    (blank fields were intentionally left blank)    Balance:    Balance IE    R SLS 1 sec.    L SLS 1 sec.              Exercise Flow Sheet    Exercise Specifics 12/01/17             UBE   Seated UEs only 5' abd bracing 2.5'F2.5'B EM             3 way hip   NMR BTB B x10, UE support  (no hip flex) EM             C leg press   100# x20  EM             rockerboard   NMR Taps x10, balance x30" each EM             Balance machine    -             L Hip flexor stretch   kneeling x1' EM             B HS stretch   Stand  with stool 2x30" EM             Hip adductor stretch   Standing 2x30" -                                                                Home Exercise Program                 (Initials = supervised exercise by clinician)    [email or web address]  Access Code: [email or web address]   URL: [email or web address]   Date: 11/25/2017   Prepared by: Jayme Cloud     Exercises   Supine Hip Adductor Stretch - 3 sets - 30 seconds hold - 1x daily - 7x weekly   Prone Quadriceps Stretch with Strap - 3 sets - 30 seconds hold - 1x daily - 7x weekly   [name] Stretch on Table - 3 sets - 30 seconds hold - 1x daily - 7x weekly   Standing Hamstring Stretch with Step - 3 sets - 30 hold - 1x daily - 7x weekly   Standing Single Leg Stance with Counter Support - 3 sets - 30 seconds hold - 1x daily - 7x weekly   Assessment   Patient had no increase in pain with today's exercises. But stated concern with some of balance exercises due to absent sensation in feet. However he was able to maintain balance with these exercises with UE support.    Plan   Continue per POC  Per pt, he cannot lie supine for more than 5-10 minutes due to only having 40-50% functioning heart. Recommend reclined position. Uncontrolled diabetes. Absent sensation B feet      Goals    Goal 1:  Patient will demonstrate independence in prescribed HEP with proper form, sets and reps for safe discharge to an independent program.       Sessions:  16      Goal 2:  Patient will increase FOTO to 65   Sessions:  16      Goal 3:  Patient will increase B SLS to 10 seconds for decreased fall risk.   Sessions:  16      Goal 4:  Pt will decrease pain to 1/10 with sitting or lying down.    Sessions:  [address] PT

## 2017-12-02 ENCOUNTER — Inpatient Hospital Stay: Payer: No Typology Code available for payment source | Admitting: Rehabilitative and Restorative Service Providers"

## 2017-12-04 ENCOUNTER — Inpatient Hospital Stay
Payer: No Typology Code available for payment source | Attending: Student in an Organized Health Care Education/Training Program

## 2017-12-04 DIAGNOSIS — S76012D Strain of muscle, fascia and tendon of left hip, subsequent encounter: Secondary | ICD-10-CM | POA: Insufficient documentation

## 2017-12-04 NOTE — PT/OT Therapy Note (Signed)
Name: [name] "[name]" Age: 57 y.o.   Referring Physician: Noel Christmas*   Date of Injury: 11/04/2017  Date Care Plan Established/Reviewed: 11/25/2017  Date Treatment Started: 11/25/2017  Visit Count: 3   Diagnosis:   1. Hip strain, left, subsequent encounter        Subjective      Occasional pinching in the front of the hip but overall much improved.                      Treatment     Therapeutic Exercises   Justification: To improve: Flexibility/ROM, Stabilization, Strength  Per flowsheet    VCs for proper form with all new exercises    Neuromuscular Re-Education   Justification: For activation and/or inhibition of target muscle, balance for improved stability on level/unlevel surfaces  Balance exercises per flowsheet    Manual Therapy   Justification: To improve joint and soft tissue mobility and reduce trigger points  Reclined: STM L hip flexors    Modalities   Skin check completed    Ice Pack 10 min. Location L hip Position reclined  Therapy Rationale: Decrease Pain       ---      ---   Total Time   Timed Minutes  39 minutes   Total Time  39 minutes        OUTCOMES IE    FOTO 48      OBJECTIVE:    Range of Motion: (degrees)    Initial Uninvolved  AROM Hip Initial Involved AROM L    102 Flexion 103     Extension      Abduction     25 Internal Rotation 23    37 External Rotation 40    (blank fields were intentionally left blank)      Initial   Uninvolved LE Strength  MMT /5 Initial  Involved    5 Hip Flexion 5    5 Hip Extension 5    5 Hip Abduction 5    - Hip Adduction 5     Hip Internal Rot     5 Hip External Rot 5    5 Quadriceps 5    5 Hamstrings 5    (blank fields were intentionally left blank)    Balance:    Balance IE    R SLS 1 sec.    L SLS 1 sec.              Exercise Flow Sheet    Exercise Specifics 12/01/17 12/04/17            UBE   Seated UEs only 5' abd bracing 2.5'F2.5'B EM KG            3 way hip   NMR BTB B x10, UE support (no hip flex) EM KG            C leg press   100# x20  EM  x30  KG            rockerboard   NMR Taps x10, balance x30" each EM             Balance machine    -             L Hip flexor stretch   kneeling x1' EM KG            B HS stretch   Stand with stool 2x30" EM KG  Hip adductor stretch   Standing 2x30" - KG                                                               Home Exercise Program                 (Initials = supervised exercise by clinician)    [email or web address]  Access Code: [email or web address]   URL: [email or web address]   Date: 11/25/2017   Prepared by: Jayme Cloud     Exercises   Supine Hip Adductor Stretch - 3 sets - 30 seconds hold - 1x daily - 7x weekly   Prone Quadriceps Stretch with Strap - 3 sets - 30 seconds hold - 1x daily - 7x weekly   [name] Stretch on Table - 3 sets - 30 seconds hold - 1x daily - 7x weekly   Standing Hamstring Stretch with Step - 3 sets - 30 hold - 1x daily - 7x weekly   Standing Single Leg Stance with Counter Support - 3 sets - 30 seconds hold - 1x daily - 7x weekly   Assessment   Pt was able to complete all exercises without pain.  Slight tenderness at L hip flexors with STM  Plan   Continue per POC pt maybe ready for discharge over next 2 visits if continues with no pain.  Per pt, he cannot lie supine for more than 5-10 minutes due to only having 40-50% functioning heart. Recommend reclined position. Uncontrolled diabetes. Absent sensation B feet        Goals    Goal 1:  Patient will demonstrate independence in prescribed HEP with proper form, sets and reps for safe discharge to an independent program.       Sessions:  16      Goal 2:  Patient will increase FOTO to 65   Sessions:  16      Goal 3:  Patient will increase B SLS to 10 seconds for decreased fall risk.   Sessions:  16      Goal 4:  Pt will decrease pain to 1/10 with sitting or lying down.    Sessions:  [address], [place]

## 2017-12-09 ENCOUNTER — Ambulatory Visit (INDEPENDENT_AMBULATORY_CARE_PROVIDER_SITE_OTHER): Payer: Self-pay | Admitting: Acute Care

## 2017-12-09 ENCOUNTER — Inpatient Hospital Stay
Payer: No Typology Code available for payment source | Attending: Student in an Organized Health Care Education/Training Program | Admitting: Rehabilitative and Restorative Service Providers"

## 2017-12-09 DIAGNOSIS — S76012D Strain of muscle, fascia and tendon of left hip, subsequent encounter: Secondary | ICD-10-CM | POA: Insufficient documentation

## 2017-12-09 NOTE — Progress Notes (Signed)
Name: Barrington Worley "[name]" Age: 57 y.o.   Referring Physician: Noel Christmas*   Date of Injury: 11/04/2017  Date Care Plan Established/Reviewed: 11/25/2017  Date Treatment Started: 11/25/2017  Visit Count: 4   Diagnosis:   1. Hip strain, left, subsequent encounter      Subjective   Pt reports 0/10 hip pain with ADLs (no pain with stairs, lying down, sitting).    OUTCOMES IE 12/09/17   FOTO 48 88     OBJECTIVE:    Initial Uninvolved  AROM Hip Initial Involved AROM L 12/09/17 L AROM   102 Flexion 103     Extension      Abduction     25 Internal Rotation 23 27   37 External Rotation 40 27   (blank fields were intentionally left blank)      Initial   Uninvolved LE Strength  MMT /5 Initial  Involved    5 Hip Flexion 5    5 Hip Extension 5    5 Hip Abduction 5    - Hip Adduction 5     Hip Internal Rot     5 Hip External Rot 5    5 Quadriceps 5    5 Hamstrings 5    (blank fields were intentionally left blank)    Balance:    Balance IE 12/09/17   R SLS 1 sec. 2   L SLS 1 sec. 3     Assessment   Pt has met all goals except SLS goal, however has peripheral neuropathies which limits improvement. He has no hip pain with ADLs and is appropriate to D/C from PT.     Plan   D/C from PT        Goals    Goal 1:  Patient will demonstrate independence in prescribed HEP with proper form, sets and reps for safe discharge to an independent program.    12/09/17       Sessions:  16   Progression:  met      Goal 2:  Patient will increase FOTO to 65  12/09/17   Sessions:  16   Progression:  met       Goal 3:  Patient will increase B SLS to 10 seconds for decreased fall risk.  12/09/17   Sessions:  16   Progression:  progressing      Goal 4:  Pt will decrease pain to 1/10 with sitting or lying down.   12/09/17   Sessions:  16   Progression:  met                                Ledell Noss, PT

## 2017-12-09 NOTE — PT/OT Therapy Note (Signed)
Name: [name] "[name]" Age: 57 y.o.   Referring Physician: Noel Christmas*   Date of Injury: 11/04/2017  Date Care Plan Established/Reviewed: 11/25/2017  Date Treatment Started: 11/25/2017  Visit Count: 4   Diagnosis:   1. Hip strain, left, subsequent encounter        Subjective     Pt reports 0/10 hip pain with ADLs (no pain with stairs, lying down, sitting).                      Treatment     Therapeutic Exercises   Justification: To improve: Flexibility/ROM, Stabilization, Strength  Per flowsheet    Neuromuscular Re-Education   Justification: For activation and/or inhibition of target muscle, balance for improved stability on level/unlevel surfaces  Balance exercises per flowsheet       ---      ---   Total Time   Timed Minutes  32 minutes   Total Time  32 minutes        OUTCOMES IE 12/09/17   FOTO 48 88     OBJECTIVE:    Range of Motion: (degrees)    Initial Uninvolved  AROM Hip Initial Involved AROM L 12/09/17 L   102 Flexion 103     Extension      Abduction     25 Internal Rotation 23 27   37 External Rotation 40 27   (blank fields were intentionally left blank)      Initial   Uninvolved LE Strength  MMT /5 Initial  Involved    5 Hip Flexion 5    5 Hip Extension 5    5 Hip Abduction 5    - Hip Adduction 5     Hip Internal Rot     5 Hip External Rot 5    5 Quadriceps 5    5 Hamstrings 5    (blank fields were intentionally left blank)    Balance:    Balance IE 12/09/17   R SLS 1 sec. 2   L SLS 1 sec. 3             Exercise Flow Sheet    Exercise Specifics 12/01/17 12/04/17 12/09/17           UBE   Seated UEs only 5' abd bracing 2.5'F2.5'B EM KG EM           3 way hip   NMR BTB B x10, UE support (no hip flex) EM KG EM           C leg press   100# x20  EM x30  KG EM           rockerboard   NMR Taps x10, balance x30" each EM  EM           Balance machine    -  -           L Hip flexor stretch   kneeling x1' EM KG EM           B HS stretch   Stand with stool 2x30" EM KG EM           Hip adductor stretch    Standing 2x30" - KG B EM  Home Exercise Program                 (Initials = supervised exercise by clinician)    [email or web address]  Access Code: [email or web address]   URL: [email or web address]   Date: 11/25/2017   Prepared by: Jayme Cloud     Exercises   Supine Hip Adductor Stretch - 3 sets - 30 seconds hold - 1x daily - 7x weekly   Prone Quadriceps Stretch with Strap - 3 sets - 30 seconds hold - 1x daily - 7x weekly   [name] Stretch on Table - 3 sets - 30 seconds hold - 1x daily - 7x weekly   Standing Hamstring Stretch with Step - 3 sets - 30 hold - 1x daily - 7x weekly   Standing Single Leg Stance with Counter Support - 3 sets - 30 seconds hold - 1x daily - 7x weekly   Assessment   Pt has met all goals except SLS goal, however has peripheral neuropathies which limits improvement. He has no hip pain with ADLs and is appropriate to D/C from PT.   Plan   D/C from PT        Goals    Goal 1:  Patient will demonstrate independence in prescribed HEP with proper form, sets and reps for safe discharge to an independent program.    12/09/17       Sessions:  16   Progression:  met      Goal 2:  Patient will increase FOTO to 65  12/09/17   Sessions:  16   Progression:  met       Goal 3:  Patient will increase B SLS to 10 seconds for decreased fall risk.  12/09/17   Sessions:  16   Progression:  progressing      Goal 4:  Pt will decrease pain to 1/10 with sitting or lying down.   12/09/17   Sessions:  16   Progression:  met                                Ledell Noss, PT

## 2017-12-11 ENCOUNTER — Inpatient Hospital Stay: Payer: No Typology Code available for payment source

## 2017-12-16 ENCOUNTER — Inpatient Hospital Stay: Payer: No Typology Code available for payment source

## 2017-12-18 ENCOUNTER — Inpatient Hospital Stay: Payer: No Typology Code available for payment source

## 2017-12-23 ENCOUNTER — Inpatient Hospital Stay: Payer: No Typology Code available for payment source | Admitting: Rehabilitative and Restorative Service Providers"

## 2017-12-25 ENCOUNTER — Inpatient Hospital Stay: Payer: No Typology Code available for payment source | Admitting: Rehabilitative and Restorative Service Providers"

## 2018-01-19 ENCOUNTER — Observation Stay
Admission: EM | Admit: 2018-01-19 | Discharge: 2018-01-22 | DRG: 603 | Disposition: A | Discharge status: Home / Self Care | Payer: No Typology Code available for payment source | Attending: Internal Medicine | Admitting: Internal Medicine

## 2018-01-19 ENCOUNTER — Emergency Department: Payer: No Typology Code available for payment source

## 2018-01-19 ENCOUNTER — Ambulatory Visit (INDEPENDENT_AMBULATORY_CARE_PROVIDER_SITE_OTHER): Payer: No Typology Code available for payment source | Admitting: Internal Medicine

## 2018-01-19 DIAGNOSIS — T25221A Burn of second degree of right foot, initial encounter: Secondary | ICD-10-CM | POA: Diagnosis present

## 2018-01-19 DIAGNOSIS — Z87891 Personal history of nicotine dependence: Secondary | ICD-10-CM

## 2018-01-19 DIAGNOSIS — L0390 Cellulitis, unspecified: Secondary | ICD-10-CM | POA: Diagnosis present

## 2018-01-19 DIAGNOSIS — E7849 Other hyperlipidemia: Secondary | ICD-10-CM | POA: Diagnosis present

## 2018-01-19 DIAGNOSIS — L03119 Cellulitis of unspecified part of limb: Secondary | ICD-10-CM

## 2018-01-19 DIAGNOSIS — Z794 Long term (current) use of insulin: Secondary | ICD-10-CM

## 2018-01-19 DIAGNOSIS — G4730 Sleep apnea, unspecified: Secondary | ICD-10-CM | POA: Diagnosis present

## 2018-01-19 DIAGNOSIS — E1142 Type 2 diabetes mellitus with diabetic polyneuropathy: Secondary | ICD-10-CM | POA: Diagnosis present

## 2018-01-19 DIAGNOSIS — I110 Hypertensive heart disease with heart failure: Secondary | ICD-10-CM | POA: Diagnosis present

## 2018-01-19 DIAGNOSIS — Z79899 Other long term (current) drug therapy: Secondary | ICD-10-CM

## 2018-01-19 DIAGNOSIS — I5022 Chronic systolic (congestive) heart failure: Secondary | ICD-10-CM | POA: Diagnosis present

## 2018-01-19 DIAGNOSIS — L03115 Cellulitis of right lower limb: Principal | ICD-10-CM | POA: Diagnosis present

## 2018-01-19 DIAGNOSIS — E871 Hypo-osmolality and hyponatremia: Secondary | ICD-10-CM | POA: Diagnosis present

## 2018-01-19 DIAGNOSIS — E1165 Type 2 diabetes mellitus with hyperglycemia: Secondary | ICD-10-CM | POA: Diagnosis present

## 2018-01-19 DIAGNOSIS — N179 Acute kidney failure, unspecified: Secondary | ICD-10-CM | POA: Diagnosis present

## 2018-01-19 HISTORY — DX: Essential (primary) hypertension: I10

## 2018-01-19 HISTORY — DX: Type 2 diabetes mellitus without complications: E11.9

## 2018-01-19 LAB — CBC AND DIFFERENTIAL
Absolute NRBC: 0 10*3/uL (ref 0.00–0.00)
Basophils Absolute Automated: 0.07 10*3/uL (ref 0.00–0.08)
Basophils Automated: 0.7 %
Eosinophils Absolute Automated: 0.43 10*3/uL (ref 0.00–0.44)
Eosinophils Automated: 4.5 %
Hematocrit: 41.3 % (ref 37.6–49.6)
Hgb: 14.4 g/dL (ref 12.5–17.1)
Immature Granulocytes Absolute: 0.03 10*3/uL (ref 0.00–0.07)
Immature Granulocytes: 0.3 %
Lymphocytes Absolute Automated: 2.24 10*3/uL (ref 0.42–3.22)
Lymphocytes Automated: 23.3 %
MCH: 31.3 pg (ref 25.1–33.5)
MCHC: 34.9 g/dL (ref 31.5–35.8)
MCV: 89.8 fL (ref 78.0–96.0)
MPV: 9.4 fL (ref 8.9–12.5)
Monocytes Absolute Automated: 0.76 10*3/uL (ref 0.21–0.85)
Monocytes: 7.9 %
Neutrophils Absolute: 6.09 10*3/uL (ref 1.10–6.33)
Neutrophils: 63.3 %
Nucleated RBC: 0 /100 WBC (ref 0.0–0.0)
Platelets: 276 10*3/uL (ref 142–346)
RBC: 4.6 10*6/uL (ref 4.20–5.90)
RDW: 12 % (ref 11–15)
WBC: 9.62 10*3/uL — ABNORMAL HIGH (ref 3.10–9.50)

## 2018-01-19 LAB — BASIC METABOLIC PANEL
Anion Gap: 10 (ref 5.0–15.0)
BUN: 32 mg/dL — ABNORMAL HIGH (ref 9–28)
CO2: 22 mEq/L (ref 22–29)
Calcium: 8.3 mg/dL — ABNORMAL LOW (ref 8.5–10.5)
Chloride: 98 mEq/L — ABNORMAL LOW (ref 100–111)
Creatinine: 1.6 mg/dL — ABNORMAL HIGH (ref 0.7–1.3)
Glucose: 451 mg/dL — ABNORMAL HIGH (ref 70–100)
Potassium: 4.9 mEq/L (ref 3.5–5.1)
Sodium: 130 mEq/L — ABNORMAL LOW (ref 136–145)

## 2018-01-19 LAB — GLUCOSE WHOLE BLOOD - POCT
Whole Blood Glucose POCT: 160 mg/dL — ABNORMAL HIGH (ref 70–100)
Whole Blood Glucose POCT: 121 mg/dL — ABNORMAL HIGH (ref 70–100)
Whole Blood Glucose POCT: 100 mg/dL (ref 70–100)

## 2018-01-19 LAB — GFR: EGFR: 44.5

## 2018-01-19 LAB — LACTIC ACID, PLASMA: Lactic Acid: 0.9 mmol/L (ref 0.2–2.0)

## 2018-01-19 MED ORDER — CARVEDILOL 3.125 MG PO TABS
3.1250 mg | ORAL_TABLET | Freq: Every day | ORAL | Status: DC
Start: 2018-01-20 — End: 2018-01-19

## 2018-01-19 MED ORDER — GLUCOSE 40 % PO GEL
15.00 g | ORAL | Status: DC | PRN
Start: 2018-01-19 — End: 2018-01-22

## 2018-01-19 MED ORDER — INSULIN LISPRO 100 UNIT/ML SC SOPN
1.00 [IU] | PEN_INJECTOR | Freq: Three times a day (TID) | SUBCUTANEOUS | Status: DC
Start: 2018-01-20 — End: 2018-01-22
  Administered 2018-01-20: 18:00:00 8 [IU] via SUBCUTANEOUS
  Administered 2018-01-20: 21:00:00 4 [IU] via SUBCUTANEOUS
  Administered 2018-01-21: 18:00:00 12 [IU] via SUBCUTANEOUS
  Administered 2018-01-21: 23:00:00 5 [IU] via SUBCUTANEOUS
  Administered 2018-01-21: 12:00:00 10 [IU] via SUBCUTANEOUS
  Administered 2018-01-21: 08:00:00 4 [IU] via SUBCUTANEOUS

## 2018-01-19 MED ORDER — ENOXAPARIN SODIUM 40 MG/0.4ML SC SOLN
40.00 mg | Freq: Every day | SUBCUTANEOUS | Status: DC
Start: 2018-01-19 — End: 2018-01-20
  Administered 2018-01-19 – 2018-01-20 (×2): 40 mg via SUBCUTANEOUS
  Filled 2018-01-19 (×2): qty 0.4

## 2018-01-19 MED ORDER — INSULIN REGULAR HUMAN 100 UNIT/ML IJ SOLN
5.00 [IU] | Freq: Once | INTRAMUSCULAR | Status: AC
Start: 2018-01-19 — End: 2018-01-19
  Administered 2018-01-19: 15:00:00 5 [IU] via INTRAVENOUS
  Filled 2018-01-19: qty 3

## 2018-01-19 MED ORDER — INSULIN DETEMIR 100 UNIT/ML SC SOPN
50.00 [IU] | PEN_INJECTOR | Freq: Two times a day (BID) | SUBCUTANEOUS | Status: DC
Start: 2018-01-19 — End: 2018-01-20
  Administered 2018-01-19 – 2018-01-20 (×2): 50 [IU] via SUBCUTANEOUS

## 2018-01-19 MED ORDER — NALOXONE HCL 0.4 MG/ML IJ SOLN (WRAP)
0.20 mg | INTRAMUSCULAR | Status: DC | PRN
Start: 2018-01-19 — End: 2018-01-22

## 2018-01-19 MED ORDER — ONDANSETRON 4 MG PO TBDP
4.00 mg | ORAL_TABLET | ORAL | Status: DC | PRN
Start: 2018-01-19 — End: 2018-01-19

## 2018-01-19 MED ORDER — ACETAMINOPHEN 650 MG RE SUPP
650.00 mg | RECTAL | Status: DC | PRN
Start: 2018-01-19 — End: 2018-01-22

## 2018-01-19 MED ORDER — SACUBITRIL-VALSARTAN 24-26 MG PO TABS
1.00 | ORAL_TABLET | Freq: Every day | ORAL | Status: DC
Start: 2018-01-20 — End: 2018-01-22
  Administered 2018-01-20 – 2018-01-22 (×3): 1 via ORAL
  Filled 2018-01-19 (×3): qty 1

## 2018-01-19 MED ORDER — ONDANSETRON 4 MG PO TBDP
4.00 mg | ORAL_TABLET | Freq: Four times a day (QID) | ORAL | Status: DC | PRN
Start: 2018-01-19 — End: 2018-01-22

## 2018-01-19 MED ORDER — PATIENT SUPPLIED NON FORMULARY
40.00 [IU] | Freq: Three times a day (TID) | Status: DC
Start: 2018-01-20 — End: 2018-01-19

## 2018-01-19 MED ORDER — ONDANSETRON HCL 4 MG/2ML IJ SOLN
4.00 mg | Freq: Four times a day (QID) | INTRAMUSCULAR | Status: DC | PRN
Start: 2018-01-19 — End: 2018-01-22

## 2018-01-19 MED ORDER — FUROSEMIDE 20 MG PO TABS
20.00 mg | ORAL_TABLET | Freq: Every day | ORAL | Status: DC
Start: 2018-01-20 — End: 2018-01-22
  Administered 2018-01-20 – 2018-01-22 (×3): 20 mg via ORAL
  Filled 2018-01-19 (×3): qty 1

## 2018-01-19 MED ORDER — CEFTRIAXONE SODIUM 1 G IJ SOLR
1.00 g | Freq: Once | INTRAMUSCULAR | Status: AC
Start: 2018-01-19 — End: 2018-01-19
  Administered 2018-01-19: 15:00:00 1 g via INTRAVENOUS
  Filled 2018-01-19: qty 1000

## 2018-01-19 MED ORDER — GLUCAGON 1 MG IJ SOLR (WRAP)
1.00 mg | INTRAMUSCULAR | Status: DC | PRN
Start: 2018-01-19 — End: 2018-01-22

## 2018-01-19 MED ORDER — SODIUM CHLORIDE 0.9 % IV SOLN
2.00 g | Freq: Two times a day (BID) | INTRAVENOUS | Status: DC
Start: 2018-01-19 — End: 2018-01-22
  Administered 2018-01-19 – 2018-01-22 (×6): 2 g via INTRAVENOUS
  Filled 2018-01-19 (×8): qty 2000

## 2018-01-19 MED ORDER — DEXTROSE 50 % IV SOLN
12.50 g | INTRAVENOUS | Status: DC | PRN
Start: 2018-01-19 — End: 2018-01-22

## 2018-01-19 MED ORDER — INSULIN LISPRO 100 UNIT/ML SC SOPN
1.00 [IU] | PEN_INJECTOR | Freq: Four times a day (QID) | SUBCUTANEOUS | Status: DC | PRN
Start: 2018-01-19 — End: 2018-01-22
  Administered 2018-01-19: 19:00:00 5 [IU] via SUBCUTANEOUS

## 2018-01-19 MED ORDER — EZETIMIBE 10 MG PO TABS
10.00 mg | ORAL_TABLET | Freq: Every day | ORAL | Status: DC
Start: 2018-01-19 — End: 2018-01-22
  Administered 2018-01-19 – 2018-01-22 (×4): 10 mg via ORAL
  Filled 2018-01-19 (×4): qty 1

## 2018-01-19 MED ORDER — ACETAMINOPHEN 325 MG PO TABS
650.0000 mg | ORAL_TABLET | Freq: Four times a day (QID) | ORAL | Status: DC | PRN
Start: 2018-01-19 — End: 2018-01-19

## 2018-01-19 MED ORDER — VANCOMYCIN 1000 MG IN 250 ML NS IVPB VIAL-MATE (CNR)
1000.00 mg | Freq: Once | INTRAVENOUS | Status: AC
Start: 2018-01-19 — End: 2018-01-19
  Administered 2018-01-19: 16:00:00 1000 mg via INTRAVENOUS
  Filled 2018-01-19: qty 250

## 2018-01-19 MED ORDER — CARVEDILOL 6.25 MG PO TABS
6.25 mg | ORAL_TABLET | Freq: Two times a day (BID) | ORAL | Status: DC
Start: 2018-01-19 — End: 2018-01-22
  Administered 2018-01-19 – 2018-01-22 (×6): 6.25 mg via ORAL
  Filled 2018-01-19 (×6): qty 1

## 2018-01-19 MED ORDER — ACETAMINOPHEN 325 MG PO TABS
650.00 mg | ORAL_TABLET | ORAL | Status: DC | PRN
Start: 2018-01-19 — End: 2018-01-22
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
## 2018-01-19 NOTE — Plan of Care (Signed)
Problem: Safety  Goal: Patient will be free from injury during hospitalization   01/19/18 1844   Goal/Interventions addressed this shift   Patient will be free from injury during hospitalization  Assess patient's risk for falls and implement fall prevention plan of care per policy;Provide and maintain safe environment;Use appropriate transfer methods;Ensure appropriate safety devices are available at the bedside;Include patient/ family/ care giver in decisions related to safety   Pt oriented to room and how to use call bell. Pt informed to use call bell before getting up out of bed. Pt verbalized understanding.
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
## 2018-01-20 ENCOUNTER — Inpatient Hospital Stay: Payer: No Typology Code available for payment source

## 2018-01-20 LAB — CBC AND DIFFERENTIAL
Absolute NRBC: 0 10*3/uL (ref 0.00–0.00)
Basophils Absolute Automated: 0.09 10*3/uL — ABNORMAL HIGH (ref 0.00–0.08)
Basophils Automated: 1 %
Eosinophils Absolute Automated: 0.37 10*3/uL (ref 0.00–0.44)
Eosinophils Automated: 3.9 %
Hematocrit: 42.5 % (ref 37.6–49.6)
Hgb: 14.6 g/dL (ref 12.5–17.1)
Immature Granulocytes Absolute: 0.02 10*3/uL (ref 0.00–0.07)
Immature Granulocytes: 0.2 %
Lymphocytes Absolute Automated: 1.97 10*3/uL (ref 0.42–3.22)
Lymphocytes Automated: 21 %
MCH: 30.7 pg (ref 25.1–33.5)
MCHC: 34.4 g/dL (ref 31.5–35.8)
MCV: 89.5 fL (ref 78.0–96.0)
MPV: 9.2 fL (ref 8.9–12.5)
Monocytes Absolute Automated: 0.92 10*3/uL — ABNORMAL HIGH (ref 0.21–0.85)
Monocytes: 9.8 %
Neutrophils Absolute: 6 10*3/uL (ref 1.10–6.33)
Neutrophils: 64.1 %
Nucleated RBC: 0 /100 WBC (ref 0.0–0.0)
Platelets: 263 10*3/uL (ref 142–346)
RBC: 4.75 10*6/uL (ref 4.20–5.90)
RDW: 12 % (ref 11–15)
WBC: 9.37 10*3/uL (ref 3.10–9.50)

## 2018-01-20 LAB — BASIC METABOLIC PANEL
Anion Gap: 10 (ref 5.0–15.0)
BUN: 21 mg/dL (ref 9–28)
CO2: 22 mEq/L (ref 22–29)
Calcium: 8.8 mg/dL (ref 8.5–10.5)
Chloride: 105 mEq/L (ref 100–111)
Creatinine: 1 mg/dL (ref 0.7–1.3)
Glucose: 93 mg/dL (ref 70–100)
Potassium: 4.2 mEq/L (ref 3.5–5.1)
Sodium: 137 mEq/L (ref 136–145)

## 2018-01-20 LAB — GLUCOSE WHOLE BLOOD - POCT
Whole Blood Glucose POCT: 139 mg/dL — ABNORMAL HIGH (ref 70–100)
Whole Blood Glucose POCT: 102 mg/dL — ABNORMAL HIGH (ref 70–100)
Whole Blood Glucose POCT: 92 mg/dL (ref 70–100)
Whole Blood Glucose POCT: 121 mg/dL — ABNORMAL HIGH (ref 70–100)
Whole Blood Glucose POCT: 298 mg/dL — ABNORMAL HIGH (ref 70–100)
Whole Blood Glucose POCT: 377 mg/dL — ABNORMAL HIGH (ref 70–100)

## 2018-01-20 LAB — GFR: EGFR: >60

## 2018-01-20 LAB — MRSA CULTURE
Culture MRSA Surveillance: NEGATIVE
Culture MRSA Surveillance: NEGATIVE

## 2018-01-20 MED ORDER — VANCOMYCIN 1000 MG IN 250 ML NS IVPB VIAL-MATE (CNR)
1000.00 mg | Freq: Two times a day (BID) | INTRAVENOUS | Status: DC
Start: 2018-01-20 — End: 2018-01-22
  Administered 2018-01-20 – 2018-01-21 (×3): 1000 mg via INTRAVENOUS
  Filled 2018-01-20 (×6): qty 250

## 2018-01-20 MED ORDER — NICOTINE POLACRILEX 2 MG MT GUM
2.00 mg | CHEWING_GUM | OROMUCOSAL | Status: DC | PRN
Start: 2018-01-20 — End: 2018-01-22
  Administered 2018-01-20 – 2018-01-22 (×3): 2 mg via ORAL
  Filled 2018-01-20: qty 1

## 2018-01-20 MED ORDER — NICOTINE POLACRILEX 2 MG MT GUM
2.00 mg | CHEWING_GUM | Freq: Four times a day (QID) | OROMUCOSAL | Status: DC | PRN
Start: 2018-01-20 — End: 2018-01-20

## 2018-01-20 MED ORDER — GADOBUTROL 1 MMOL/ML IV SOLN
9.00 mL | Freq: Once | INTRAVENOUS | Status: AC | PRN
Start: 2018-01-20 — End: 2018-01-20
  Administered 2018-01-20: 14:00:00 9 mmol via INTRAVENOUS

## 2018-01-20 MED ORDER — INSULIN DETEMIR 100 UNIT/ML SC SOPN
25.00 [IU] | PEN_INJECTOR | Freq: Two times a day (BID) | SUBCUTANEOUS | Status: DC
Start: 2018-01-20 — End: 2018-01-21
  Administered 2018-01-20: 18:00:00 25 [IU] via SUBCUTANEOUS

## 2018-01-20 MED ORDER — NICOTINE POLACRILEX 2 MG MT GUM
2.00 mg | CHEWING_GUM | OROMUCOSAL | Status: DC
Start: 2018-01-20 — End: 2018-01-20
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
## 2018-01-20 NOTE — Plan of Care (Signed)
Problem: Pain  Goal: Pain at adequate level as identified by patient   01/20/18 1408   Goal/Interventions addressed this shift   Pain at adequate level as identified by patient Identify patient comfort function goal;Assess pain on admission, during daily assessment and/or before any "as needed" intervention(s)   Pt denies any pain at this time.     Problem: Compromised Tissue integrity  Goal: Damaged tissue is healing and protected   01/20/18 0018   Goal/Interventions addressed this shift   Damaged tissue is healing and protected  Monitor/assess Braden scale every shift;Avoid shearing injuries;Keep intact skin clean and dry;Monitor patient's hygiene practices   Wound care RN Kim assessed the wound with this RN, dressings to be removed and wound left open to air. Wound has a large blister on the L plantar surface that secretes yellow pus. Plan is that the patient will be seen by plastics in the next day and do an I&D tomorrow. Pt will be NPO at midnight. Pt had MRI today, results pending.     Problem: Diabetes: Glucose Imbalance  Goal: Blood glucose stable at established goal   01/20/18 0023   Goal/Interventions addressed this shift   Blood glucose stable at established goal  Monitor lab values;Assess for hypoglycemia /hyperglycemia;Monitor/assess vital signs;Coordinate medication administration with meals, as indicated;Ensure appropriate diet and assess tolerance;Ensure adequate hydration;Ensure patient/family has adequate teaching materials;Include patient/family in decisions related to nutrition/dietary selections   Pt's BS have been stable, not requiring any insulin coverage. Pt understands sliding scale and the need for not always providing coverage based on pre-meal BS checks. Pt  Has his own insulin that has been approved by pharmacy that is in the med room.     Comments: Pt is alert and oriented x 4, VSS and NAD noted. Pt denied needs at this time. Pt had nicotine gum with him, request made to dr Jacques Navy to  allow he patient to use it while in the hospital. Waiting for orders to come through. All scheduled meds given. Pt was encouraged to call for assistance PRN. Pt verbalized understanding of all information given at this time.  Bed in lowest position, call light within reach.Will continue to monitor for c/o pain and/or changes in status.
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
## 2018-01-20 NOTE — Progress Notes (Signed)
CM met with [name]. Explained role and provided contact info.  Patient is independent in his care needs and ambulation. Resides with supportive wife.  Hx of home ivabx for osteo.   Reviewed POC and DCP.  MRI and Podiatry consult pending.   CM will follow for POC/DCP.     01/20/18 1142   Patient Type   Within 30 Days of Previous Admission? No   Healthcare Decisions   Interviewed: Patient   Orientation/Decision Making Abilities of Patient Alert and Oriented x3, able to make decisions   Prior to admission   Prior level of function Independent with ADLs   Type of Residence Private residence   Living Arrangements Spouse/significant other   Dressing Independent   Grooming Independent   Feeding Independent   Bathing Independent   Toileting Independent   Discharge Planning   Support Systems Spouse/significant other   Patient expects to be discharged to: Home   Anticipated [place] plan discussed with: Same as interviewed   Mode of transportation: Private car (family member)   Consults/Providers   PT Evaluation Needed 1   Correct PCP listed in Epic? Yes
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
## 2018-01-20 NOTE — Plan of Care (Signed)
Problem: Compromised Tissue integrity  Goal: Damaged tissue is healing and protected  Outcome: Progressing   01/20/18 0018   Goal/Interventions addressed this shift   Damaged tissue is healing and protected  Monitor/assess Braden scale every shift;Avoid shearing injuries;Keep intact skin clean and dry;Monitor patient's hygiene practices     Pt VSS. Pt denied pain at this time. Wound on right foot weeping now, wrapped in kerlix. Legs elevated on pillows. Wound nurse consult in AM. Pt educated to call for assistance. Bed alarm on, call bell within reach. Will continue to monitor.    Problem: Diabetes: Glucose Imbalance  Goal: Blood glucose stable at established goal  Outcome: Progressing   01/20/18 0023   Goal/Interventions addressed this shift   Blood glucose stable at established goal  Monitor lab values;Assess for hypoglycemia /hyperglycemia;Monitor/assess vital signs;Coordinate medication administration with meals, as indicated;Ensure appropriate diet and assess tolerance;Ensure adequate hydration;Ensure patient/family has adequate teaching materials;Include patient/family in decisions related to nutrition/dietary selections     Pt is a diabetic. Pt has own insulin pen and refuses to use our insulin. MD and pharmacy aware. When this RN rounded after report, pt verbalized he had used his Levemir 50 units and 5 units of Humalog pen. Pt verbalized his BG was 145. Pt educated that he needs to notify RN before administering. Rn educated on sliding scale parameters and hypoglycemia. Pharmacy rounded on pt and pt verbalized he would notify us. MD aware. BG rechecked at 2200 at it was 100, will recheck at 0300.

## 2018-01-21 LAB — CBC AND DIFFERENTIAL
Absolute NRBC: 0 10*3/uL (ref 0.00–0.00)
Basophils Absolute Automated: 0.11 10*3/uL — ABNORMAL HIGH (ref 0.00–0.08)
Basophils Automated: 1.2 %
Eosinophils Absolute Automated: 0.35 10*3/uL (ref 0.00–0.44)
Eosinophils Automated: 3.9 %
Hematocrit: 41.6 % (ref 37.6–49.6)
Hgb: 14.2 g/dL (ref 12.5–17.1)
Immature Granulocytes Absolute: 0.02 10*3/uL (ref 0.00–0.07)
Immature Granulocytes: 0.2 %
Lymphocytes Absolute Automated: 1.9 10*3/uL (ref 0.42–3.22)
Lymphocytes Automated: 21 %
MCH: 30.8 pg (ref 25.1–33.5)
MCHC: 34.1 g/dL (ref 31.5–35.8)
MCV: 90.2 fL (ref 78.0–96.0)
MPV: 9.3 fL (ref 8.9–12.5)
Monocytes Absolute Automated: 0.81 10*3/uL (ref 0.21–0.85)
Monocytes: 9 %
Neutrophils Absolute: 5.84 10*3/uL (ref 1.10–6.33)
Neutrophils: 64.7 %
Nucleated RBC: 0 /100 WBC (ref 0.0–0.0)
Platelets: 270 10*3/uL (ref 142–346)
RBC: 4.61 10*6/uL (ref 4.20–5.90)
RDW: 12 % (ref 11–15)
WBC: 9.03 10*3/uL (ref 3.10–9.50)

## 2018-01-21 LAB — BASIC METABOLIC PANEL
Anion Gap: 9 (ref 5.0–15.0)
BUN: 16 mg/dL (ref 9–28)
CO2: 24 mEq/L (ref 22–29)
Calcium: 8.6 mg/dL (ref 8.5–10.5)
Chloride: 102 mEq/L (ref 100–111)
Creatinine: 1.1 mg/dL (ref 0.7–1.3)
Glucose: 309 mg/dL — ABNORMAL HIGH (ref 70–100)
Potassium: 4.7 mEq/L (ref 3.5–5.1)
Sodium: 135 mEq/L — ABNORMAL LOW (ref 136–145)

## 2018-01-21 LAB — GLUCOSE WHOLE BLOOD - POCT
Whole Blood Glucose POCT: 280 mg/dL — ABNORMAL HIGH (ref 70–100)
Whole Blood Glucose POCT: 298 mg/dL — ABNORMAL HIGH (ref 70–100)
Whole Blood Glucose POCT: 272 mg/dL — ABNORMAL HIGH (ref 70–100)
Whole Blood Glucose POCT: 262 mg/dL — ABNORMAL HIGH (ref 70–100)
Whole Blood Glucose POCT: 288 mg/dL — ABNORMAL HIGH (ref 70–100)

## 2018-01-21 LAB — VANCOMYCIN, TROUGH: Vancomycin Trough: 10.2 ug/mL (ref 10.0–20.0)

## 2018-01-21 LAB — GFR: EGFR: >60

## 2018-01-21 MED ORDER — INSULIN DETEMIR 100 UNIT/ML SC SOPN
50.00 [IU] | PEN_INJECTOR | Freq: Two times a day (BID) | SUBCUTANEOUS | Status: DC
Start: 2018-01-21 — End: 2018-01-22
  Administered 2018-01-21 – 2018-01-22 (×2): 50 [IU] via SUBCUTANEOUS
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
## 2018-01-21 NOTE — Plan of Care (Signed)
Problem: Compromised Tissue integrity  Goal: Damaged tissue is healing and protected  Outcome: Progressing   01/21/18 1825   Goal/Interventions addressed this shift   Damaged tissue is healing and protected  Keep intact skin clean and dry;Monitor/assess Braden scale every shift;Monitor patient's hygiene practices;Avoid shearing injuries   ID followed up on patient today, discharge tomorrow on oral antibiotics. Pt showed no signs of increased compromise of skin integrity throughout shift.

## 2018-01-21 NOTE — Plan of Care (Signed)
Problem: Safety  Goal: Patient will be free from injury during hospitalization   01/21/18 0353   Goal/Interventions addressed this shift   Patient will be free from injury during hospitalization  Provide and maintain safe environment;Include patient/ family/ care giver in decisions related to safety     Pt. vitals WDL, IV SL site intact, flushed with good blood return. NPO after midnight for possible I&D today - or maybe be changed to drainage at the bedside, due to MRI results per Dr. Theodis Sato. Per aware of POC, compliant with NPO status. Denies any pain - severe neuropathy bilaterally.    Problem: Diabetes: Glucose Imbalance  Goal: Blood glucose stable at established goal  Outcome: Progressing   01/21/18 0353   Goal/Interventions addressed this shift   Blood glucose stable at established goal  Assess for hypoglycemia /hyperglycemia;Include patient/family in decisions related to nutrition/dietary selections;Coordinate medication administration with meals, as indicated;Ensure patient/family has adequate teaching materials     Pt. reported feeling "sweaty" and "jittery" at beginning of shift, requested blood sugars to be checked. Blood sugar level of 377 recorded and covered with Pt's own approved/ordered insulin. Pt. able to self administer insulin, no verification indicated in EPIC, notified CRN to witness. Pt. resting so far with no adverse reaction or any complain. Having own snacks/fruit in the room. To follow-up this morning.
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
## 2018-01-22 LAB — BASIC METABOLIC PANEL
Anion Gap: 9 (ref 5.0–15.0)
BUN: 16 mg/dL (ref 9–28)
CO2: 21 mEq/L — ABNORMAL LOW (ref 22–29)
Calcium: 8.7 mg/dL (ref 8.5–10.5)
Chloride: 104 mEq/L (ref 100–111)
Creatinine: 1.1 mg/dL (ref 0.7–1.3)
Glucose: 256 mg/dL — ABNORMAL HIGH (ref 70–100)
Potassium: 4.8 mEq/L (ref 3.5–5.1)
Sodium: 134 mEq/L — ABNORMAL LOW (ref 136–145)

## 2018-01-22 LAB — CBC AND DIFFERENTIAL
Absolute NRBC: 0 10*3/uL (ref 0.00–0.00)
Basophils Absolute Automated: 0.07 10*3/uL (ref 0.00–0.08)
Basophils Automated: 0.8 %
Eosinophils Absolute Automated: 0.35 10*3/uL (ref 0.00–0.44)
Eosinophils Automated: 3.9 %
Hematocrit: 40.7 % (ref 37.6–49.6)
Hgb: 14.2 g/dL (ref 12.5–17.1)
Immature Granulocytes Absolute: 0.02 10*3/uL (ref 0.00–0.07)
Immature Granulocytes: 0.2 %
Lymphocytes Absolute Automated: 1.77 10*3/uL (ref 0.42–3.22)
Lymphocytes Automated: 19.6 %
MCH: 31.2 pg (ref 25.1–33.5)
MCHC: 34.9 g/dL (ref 31.5–35.8)
MCV: 89.5 fL (ref 78.0–96.0)
MPV: 9.3 fL (ref 8.9–12.5)
Monocytes Absolute Automated: 0.74 10*3/uL (ref 0.21–0.85)
Monocytes: 8.2 %
Neutrophils Absolute: 6.07 10*3/uL (ref 1.10–6.33)
Neutrophils: 67.3 %
Nucleated RBC: 0 /100 WBC (ref 0.0–0.0)
Platelets: 288 10*3/uL (ref 142–346)
RBC: 4.55 10*6/uL (ref 4.20–5.90)
RDW: 12 % (ref 11–15)
WBC: 9.02 10*3/uL (ref 3.10–9.50)

## 2018-01-22 LAB — GFR: EGFR: >60

## 2018-01-22 LAB — GLUCOSE WHOLE BLOOD - POCT
Whole Blood Glucose POCT: 179 mg/dL — ABNORMAL HIGH (ref 70–100)
Whole Blood Glucose POCT: 212 mg/dL — ABNORMAL HIGH (ref 70–100)

## 2018-01-22 MED ORDER — CEFUROXIME AXETIL 500 MG PO TABS
500.00 mg | ORAL_TABLET | Freq: Two times a day (BID) | ORAL | 0 refills | Status: AC
Start: 2018-01-22 — End: 2018-01-26

## 2018-01-22 NOTE — Progress Notes (Signed)
Wound Healing Center Navigator Note         [name] is a 57 y.o. male admitted with cellulitis .    Summary/Assessment:      Wound care managed by inpatient wound care team. Pt identified that they could benefit from services at the [facility]. Called pt explained [facility] services, locations and navigator role.    Appointment made for Wed 01/28/18 at [facility]. In navigator and appointment information and new pt paperwork emailed to pt.                                                              Plan:   Management of wound in [employer]     LOS:  LOS: 3 days   POD: * No surgery found *    PMH:   Past Medical History:   Diagnosis Date   . Dehydration    . Diabetes mellitus    . Dupre's syndrome    . Hypertension    . Migraine    . Neuropathy    . Pancreatitis    . Sleep apnea      PSH:   Past Surgical History:   Procedure Laterality Date   . BONE GRAFT     . BYPASS, CAROTID-SUBCLAVIAN     . CARDIAC CATHETERIZATION     . EYE SURGERY     . WRIST SURGERY                                                                          [name] Ramming RN, BSN  [facility] Navigator   [name]  [phone]
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
## 2018-01-22 NOTE — Plan of Care (Signed)
Provided pt/family with discharge instructions.Script to be refill.Educated pt on self care,RLE dry gauze dressing done-pt aware to follow up at wound clinic,and other  follow ups to be done,and MyChart.Pt/family denied any further questions/concerns.IV D/C'd, catheter intact. Pt left unit in wheelchair with transport. Pt being discharged home.
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
## 2018-01-22 NOTE — Plan of Care (Signed)
Problem: Compromised Tissue integrity  Goal: Damaged tissue is healing and protected  Outcome: Progressing   01/22/18 0941   Goal/Interventions addressed this shift   Damaged tissue is healing and protected  Monitor/assess Braden scale every shift;Provide wound care per wound care algorithm;Reposition patient every 2 hours and as needed unless able to reposition self;Increase activity as tolerated/progressive mobility     Goal: Nutritional status is improving  Outcome: Progressing   01/22/18 0941   Goal/Interventions addressed this shift   Nutritional status is improving Allow adequate time for meals;Include patient/patient care companion in decisions related to nutrition       Problem: Diabetes: Glucose Imbalance  Goal: Blood glucose stable at established goal  Outcome: Progressing   01/22/18 0941   Goal/Interventions addressed this shift   Blood glucose stable at established goal  Monitor intake and output. Notify LIP if urine output is < 30 mL/hour.;Monitor lab values;Follow fluid restrictions/IV/PO parameters;Include patient/family in decisions related to nutrition/dietary selections;Assess for hypoglycemia /hyperglycemia;Monitor/assess vital signs;Ensure adequate hydration;Ensure appropriate diet and assess tolerance     Pt is A*Ox4,denies any pain,and tolerated diet well.Awaiting discharge today.

## 2018-01-22 NOTE — Plan of Care (Signed)
Pt  is able to meet completed goals and outcome and will be discharge.

## 2018-01-23 ENCOUNTER — Ambulatory Visit (INDEPENDENT_AMBULATORY_CARE_PROVIDER_SITE_OTHER)
Payer: No Typology Code available for payment source | Admitting: Student in an Organized Health Care Education/Training Program
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
## 2018-01-28 ENCOUNTER — Ambulatory Visit: Payer: No Typology Code available for payment source | Attending: Podiatric Surgery

## 2018-01-28 DIAGNOSIS — S91301A Unspecified open wound, right foot, initial encounter: Secondary | ICD-10-CM | POA: Insufficient documentation

## 2018-01-28 DIAGNOSIS — E1140 Type 2 diabetes mellitus with diabetic neuropathy, unspecified: Secondary | ICD-10-CM | POA: Insufficient documentation

## 2018-01-28 DIAGNOSIS — T25322A Burn of third degree of left foot, initial encounter: Secondary | ICD-10-CM | POA: Insufficient documentation

## 2018-01-28 DIAGNOSIS — E1165 Type 2 diabetes mellitus with hyperglycemia: Secondary | ICD-10-CM | POA: Insufficient documentation

## 2018-01-28 DIAGNOSIS — G9009 Other idiopathic peripheral autonomic neuropathy: Secondary | ICD-10-CM | POA: Insufficient documentation

## 2018-01-28 MED ORDER — COLLAGENASE 250 UNIT/GM EX OINT
TOPICAL_OINTMENT | Freq: Every day | CUTANEOUS | 9 refills | Status: AC
Start: 2018-01-28 — End: 2018-02-27

## 2018-02-02 ENCOUNTER — Other Ambulatory Visit: Payer: Self-pay | Admitting: Family

## 2018-02-02 MED ORDER — AMPICILLIN 500 MG PO CAPS
500.00 mg | ORAL_CAPSULE | Freq: Four times a day (QID) | ORAL | 0 refills | Status: DC
Start: 2018-02-02 — End: 2018-02-11

## 2018-02-04 ENCOUNTER — Ambulatory Visit: Payer: No Typology Code available for payment source | Attending: Podiatric Surgery

## 2018-02-04 DIAGNOSIS — E1165 Type 2 diabetes mellitus with hyperglycemia: Secondary | ICD-10-CM | POA: Insufficient documentation

## 2018-02-04 DIAGNOSIS — I10 Essential (primary) hypertension: Secondary | ICD-10-CM | POA: Insufficient documentation

## 2018-02-04 DIAGNOSIS — E1140 Type 2 diabetes mellitus with diabetic neuropathy, unspecified: Secondary | ICD-10-CM | POA: Insufficient documentation

## 2018-02-04 DIAGNOSIS — Z87891 Personal history of nicotine dependence: Secondary | ICD-10-CM | POA: Insufficient documentation

## 2018-02-04 DIAGNOSIS — G4733 Obstructive sleep apnea (adult) (pediatric): Secondary | ICD-10-CM | POA: Insufficient documentation

## 2018-02-04 DIAGNOSIS — S91301A Unspecified open wound, right foot, initial encounter: Secondary | ICD-10-CM | POA: Insufficient documentation

## 2018-02-04 DIAGNOSIS — E785 Hyperlipidemia, unspecified: Secondary | ICD-10-CM | POA: Insufficient documentation

## 2018-02-04 DIAGNOSIS — Z95820 Peripheral vascular angioplasty status with implants and grafts: Secondary | ICD-10-CM | POA: Insufficient documentation

## 2018-02-04 DIAGNOSIS — X110XXA Contact with hot water in bath or tub, initial encounter: Secondary | ICD-10-CM | POA: Insufficient documentation

## 2018-02-04 DIAGNOSIS — R291 Meningismus: Secondary | ICD-10-CM | POA: Insufficient documentation

## 2018-02-11 ENCOUNTER — Ambulatory Visit
Payer: No Typology Code available for payment source | Attending: Podiatric Surgery | Admitting: Family Nurse Practitioner

## 2018-02-11 DIAGNOSIS — S91301A Unspecified open wound, right foot, initial encounter: Secondary | ICD-10-CM | POA: Insufficient documentation

## 2018-02-11 MED ORDER — AMOXICILLIN-POT CLAVULANATE 875-125 MG PO TABS
1.00 | ORAL_TABLET | Freq: Two times a day (BID) | ORAL | 0 refills | Status: AC
Start: 2018-02-11 — End: 2018-02-18

## 2018-02-12 ENCOUNTER — Other Ambulatory Visit (INDEPENDENT_AMBULATORY_CARE_PROVIDER_SITE_OTHER): Payer: Self-pay

## 2018-02-12 ENCOUNTER — Encounter (INDEPENDENT_AMBULATORY_CARE_PROVIDER_SITE_OTHER): Payer: Self-pay | Admitting: Student in an Organized Health Care Education/Training Program

## 2018-02-12 ENCOUNTER — Ambulatory Visit (INDEPENDENT_AMBULATORY_CARE_PROVIDER_SITE_OTHER)
Payer: No Typology Code available for payment source | Admitting: Student in an Organized Health Care Education/Training Program

## 2018-02-12 VITALS — BP 155/81 | HR 74 | Temp 97.4°F | Resp 12 | Ht 74.0 in | Wt 200.0 lb

## 2018-02-12 DIAGNOSIS — G629 Polyneuropathy, unspecified: Secondary | ICD-10-CM

## 2018-02-12 DIAGNOSIS — Z794 Long term (current) use of insulin: Secondary | ICD-10-CM

## 2018-02-12 DIAGNOSIS — E1142 Type 2 diabetes mellitus with diabetic polyneuropathy: Secondary | ICD-10-CM

## 2018-02-12 DIAGNOSIS — R2689 Other abnormalities of gait and mobility: Secondary | ICD-10-CM

## 2018-02-12 LAB — POCT GLYCOSYLATED HEMOGLOBIN (HGB A1C): POCT Hgb A1C: 12 % — AB (ref 3.9–5.9)

## 2018-02-12 NOTE — Progress Notes (Signed)
Have you seen any specialists/other providers since your last visit with [place]?    No    Arm preference verified?   Yes    The patient is due for eye exam, foot exam, influenza vaccine, shingles vaccine and [facility], Urine Micro,

## 2018-02-12 NOTE — Patient Instructions (Signed)
[facility]  Address  [address]   [place], [place] [address]  Email  [email or web address]  Phone  [phone]  Alt Phone  [phone]  Fax  [phone]  Business Hours  Monday to Friday: 8:00 AM - 10:00 PM   Saturday and Sunday: 8:00 AM - 8:00 PM
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
## 2018-02-13 ENCOUNTER — Emergency Department
Admission: EM | Admit: 2018-02-13 | Discharge: 2018-02-13 | Disposition: A | Discharge status: Home / Self Care | Payer: No Typology Code available for payment source | Attending: Emergency Medicine | Admitting: Emergency Medicine

## 2018-02-13 ENCOUNTER — Ambulatory Visit (INDEPENDENT_AMBULATORY_CARE_PROVIDER_SITE_OTHER): Payer: Self-pay | Admitting: Acute Care

## 2018-02-13 DIAGNOSIS — S91301A Unspecified open wound, right foot, initial encounter: Secondary | ICD-10-CM | POA: Insufficient documentation

## 2018-02-13 DIAGNOSIS — Z79899 Other long term (current) drug therapy: Secondary | ICD-10-CM | POA: Insufficient documentation

## 2018-02-13 DIAGNOSIS — N179 Acute kidney failure, unspecified: Secondary | ICD-10-CM | POA: Insufficient documentation

## 2018-02-13 DIAGNOSIS — E1165 Type 2 diabetes mellitus with hyperglycemia: Secondary | ICD-10-CM | POA: Insufficient documentation

## 2018-02-13 DIAGNOSIS — Z794 Long term (current) use of insulin: Secondary | ICD-10-CM | POA: Insufficient documentation

## 2018-02-13 DIAGNOSIS — Z7982 Long term (current) use of aspirin: Secondary | ICD-10-CM | POA: Insufficient documentation

## 2018-02-13 DIAGNOSIS — I10 Essential (primary) hypertension: Secondary | ICD-10-CM | POA: Insufficient documentation

## 2018-02-13 DIAGNOSIS — E1140 Type 2 diabetes mellitus with diabetic neuropathy, unspecified: Secondary | ICD-10-CM | POA: Insufficient documentation

## 2018-02-13 DIAGNOSIS — R739 Hyperglycemia, unspecified: Secondary | ICD-10-CM

## 2018-02-13 DIAGNOSIS — E11319 Type 2 diabetes mellitus with unspecified diabetic retinopathy without macular edema: Secondary | ICD-10-CM | POA: Insufficient documentation

## 2018-02-13 DIAGNOSIS — R42 Dizziness and giddiness: Secondary | ICD-10-CM | POA: Insufficient documentation

## 2018-02-13 DIAGNOSIS — R110 Nausea: Secondary | ICD-10-CM

## 2018-02-13 DIAGNOSIS — Z87891 Personal history of nicotine dependence: Secondary | ICD-10-CM | POA: Insufficient documentation

## 2018-02-13 LAB — COMPREHENSIVE METABOLIC PANEL
ALT: 19 U/L (ref 0–55)
AST (SGOT): 19 U/L (ref 5–34)
Albumin/Globulin Ratio: 1 (ref 0.9–2.2)
Albumin: 3.2 g/dL — ABNORMAL LOW (ref 3.5–5.0)
Alkaline Phosphatase: 81 U/L (ref 38–106)
Anion Gap: 11 (ref 5.0–15.0)
BUN: 21 mg/dL (ref 9–28)
Bilirubin, Total: 1 mg/dL (ref 0.2–1.2)
CO2: 22 mEq/L (ref 22–29)
Calcium: 9.1 mg/dL (ref 8.5–10.5)
Chloride: 97 mEq/L — ABNORMAL LOW (ref 100–111)
Creatinine: 1.4 mg/dL — ABNORMAL HIGH (ref 0.7–1.3)
Globulin: 3.2 g/dL (ref 2.0–3.6)
Glucose: 461 mg/dL — ABNORMAL HIGH (ref 70–100)
Potassium: 5.5 mEq/L — ABNORMAL HIGH (ref 3.5–5.1)
Protein, Total: 6.4 g/dL (ref 6.0–8.3)
Sodium: 130 mEq/L — ABNORMAL LOW (ref 136–145)

## 2018-02-13 LAB — CBC AND DIFFERENTIAL
Absolute NRBC: 0 10*3/uL (ref 0.00–0.00)
Basophils Absolute Automated: 0.09 10*3/uL — ABNORMAL HIGH (ref 0.00–0.08)
Basophils Automated: 0.9 %
Eosinophils Absolute Automated: 0.22 10*3/uL (ref 0.00–0.44)
Eosinophils Automated: 2.1 %
Hematocrit: 46.6 % (ref 37.6–49.6)
Hgb: 16.1 g/dL (ref 12.5–17.1)
Immature Granulocytes Absolute: 0.04 10*3/uL (ref 0.00–0.07)
Immature Granulocytes: 0.4 %
Lymphocytes Absolute Automated: 1.71 10*3/uL (ref 0.42–3.22)
Lymphocytes Automated: 16.2 %
MCH: 30.8 pg (ref 25.1–33.5)
MCHC: 34.5 g/dL (ref 31.5–35.8)
MCV: 89.1 fL (ref 78.0–96.0)
MPV: 9.7 fL (ref 8.9–12.5)
Monocytes Absolute Automated: 0.65 10*3/uL (ref 0.21–0.85)
Monocytes: 6.2 %
Neutrophils Absolute: 7.83 10*3/uL — ABNORMAL HIGH (ref 1.10–6.33)
Neutrophils: 74.2 %
Nucleated RBC: 0 /100 WBC (ref 0.0–0.0)
Platelets: 290 10*3/uL (ref 142–346)
RBC: 5.23 10*6/uL (ref 4.20–5.90)
RDW: 13 % (ref 11–15)
WBC: 10.54 10*3/uL — ABNORMAL HIGH (ref 3.10–9.50)

## 2018-02-13 LAB — GFR: EGFR: 51.9

## 2018-02-13 LAB — LIPASE: Lipase: 71 U/L (ref 8–78)

## 2018-02-13 LAB — TROPONIN I: Troponin I: 0.03 ng/mL (ref 0.00–0.09)

## 2018-02-13 MED ORDER — ONDANSETRON 4 MG PO TBDP
4.00 mg | ORAL_TABLET | Freq: Four times a day (QID) | ORAL | 0 refills | Status: AC | PRN
Start: 2018-02-13 — End: ?

## 2018-02-13 MED ORDER — ONDANSETRON HCL 4 MG/2ML IJ SOLN
4.00 mg | Freq: Once | INTRAMUSCULAR | Status: AC
Start: 2018-02-13 — End: 2018-02-13
  Administered 2018-02-13: 14:00:00 4 mg via INTRAVENOUS
  Filled 2018-02-13: qty 2

## 2018-02-13 MED ORDER — COLLAGENASE 250 UNIT/GM EX OINT
TOPICAL_OINTMENT | Freq: Once | CUTANEOUS | Status: AC
Start: 2018-02-13 — End: 2018-02-13
  Filled 2018-02-13: qty 30

## 2018-02-13 MED ORDER — SODIUM CHLORIDE 0.9 % IV BOLUS
1000.00 mL | Freq: Once | INTRAVENOUS | Status: AC
Start: 2018-02-13 — End: 2018-02-13
  Administered 2018-02-13: 14:00:00 1000 mL via INTRAVENOUS

## 2018-02-13 NOTE — ED Notes (Signed)
Cleansed foot with NS, applied Santyl as instructed with dressing
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
## 2018-02-13 NOTE — ED Triage Notes (Signed)
Chest pressure started today at 0400. Pt switched antibiotics. Pt talking in complete sentences. Pt denies pain reports "pressure

## 2018-02-13 NOTE — Discharge Instructions (Signed)
Dear [name]:    I appreciate your choosing the [facility] for your healthcare needs, and hope your visit today was EXCELLENT.    Instructions:  Please follow-up with Dr. Salina April (your primary care physician) as soon as able and Dr. Arlyss Queen (infectious disease) as needed.     Return to the Emergency Department for any worsening symptoms or concerns.    Below is some information that our patients often find helpful.    We wish you good health and please do not hesitate to contact us if we can ever be of any assistance.    Sincerely,  Forest Gleason, MD  [name]    ________________________________________________________________    If you do not continue to improve or your condition worsens, please contact your doctor or return immediately to the Emergency Department.    Thank you for choosing [facility] for your emergency care needs.  We strive to provide EXCELLENT care to you and your family.      DOCTOR REFERRALS  Call [phone] if you need any further referrals and we can help you find a primary care doctor or specialist.  Also, available online at:  [email or web address]    YOUR CONTACT INFORMATION  Before leaving please check with registration to make sure we have an up-to-date contact number.  You can call registration at [phone] to update your information.  For questions about your hospital bill, please call [phone].  For questions about your Emergency Dept Physician bill please call [phone].      FREE HEALTH SERVICES  If you need help with health or social services, please call 2-1-1 for a free referral to resources in your area.  2-1-1 is a free service connecting people with information on health insurance, free clinics, pregnancy, mental health, dental care, food assistance, housing, and substance abuse counseling.  Also, available online at:  http://[email or web address].org    MEDICAL RECORDS  AND TESTS  Certain laboratory test results do not come back the same day, for example urine cultures.   We will contact you if other important findings are noted.  Radiology films are often reviewed again to ensure accuracy.  If there is any discrepancy, we will notify you.      Please call [phone] to pick up a complimentary CD of any radiology studies performed.  If you or your doctor would like to request a copy of your medical records, please call [phone].      ORTHOPEDIC INJURY   Please know that significant injuries can exist even when an initial x-ray is read as normal or negative.  This can occur because some fractures (broken bones) are not initially visible on x-rays.  For this reason, close outpatient follow-up with your primary care doctor or bone specialist (orthopedist) is required.    MEDICATIONS AND FOLLOWUP  Please be aware that some prescription medications can cause drowsiness.  Use caution when driving or operating machinery.    The examination and treatment you have received in our Emergency Department is provided on an emergency basis, and is not intended to be a substitute for your primary care physician.  It is important that your doctor checks you again and that you report any new or remaining problems at that time.      24 HOUR PHARMACIES  [facility] - [address] [place], [place] [address] (1.4 miles, 7 minutes)  [facility] - [address], [place],  [place] [address] (6.5 miles, 13 minutes)  Handout with directions available on request    PATIENT RELATIONS  If you have any concerns, issues, or feedback related to your care, positive or negative, please do not hesitate to contact Patient Relations at [phone]. They are open from 8:30AM-5:00PM Monday through Friday.

## 2018-02-14 LAB — ECG 12-LEAD
Atrial Rate: 71 {beats}/min
P Axis: 81 degrees
P-R Interval: 152 ms
Q-T Interval: 394 ms
QRS Duration: 102 ms
QTC Calculation (Bezet): 428 ms
R Axis: 60 degrees
T Axis: 192 degrees
Ventricular Rate: 71 {beats}/min

## 2018-02-17 ENCOUNTER — Ambulatory Visit (INDEPENDENT_AMBULATORY_CARE_PROVIDER_SITE_OTHER)
Payer: No Typology Code available for payment source | Admitting: Student in an Organized Health Care Education/Training Program

## 2018-02-17 VITALS — BP 131/77 | HR 79 | Temp 97.6°F | Resp 20 | Wt 198.6 lb

## 2018-02-17 DIAGNOSIS — I2510 Atherosclerotic heart disease of native coronary artery without angina pectoris: Secondary | ICD-10-CM

## 2018-02-17 DIAGNOSIS — T3695XA Adverse effect of unspecified systemic antibiotic, initial encounter: Secondary | ICD-10-CM

## 2018-02-17 DIAGNOSIS — E1142 Type 2 diabetes mellitus with diabetic polyneuropathy: Secondary | ICD-10-CM

## 2018-02-17 DIAGNOSIS — R291 Meningismus: Secondary | ICD-10-CM

## 2018-02-17 DIAGNOSIS — E1069 Type 1 diabetes mellitus with other specified complication: Secondary | ICD-10-CM

## 2018-02-17 DIAGNOSIS — I2583 Coronary atherosclerosis due to lipid rich plaque: Secondary | ICD-10-CM

## 2018-02-17 DIAGNOSIS — L97502 Non-pressure chronic ulcer of other part of unspecified foot with fat layer exposed: Secondary | ICD-10-CM

## 2018-02-17 DIAGNOSIS — E7849 Other hyperlipidemia: Secondary | ICD-10-CM
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
## 2018-02-17 NOTE — Progress Notes (Signed)
Have you seen any specialists/other providers since your last visit with [place]?    No    Arm preference verified?   Yes    The patient is due for eye exam, foot exam, influenza vaccine, shingles vaccine and [facility], HEP C

## 2018-02-18 ENCOUNTER — Encounter: Payer: Self-pay | Admitting: Internal Medicine

## 2018-02-18 ENCOUNTER — Ambulatory Visit (INDEPENDENT_AMBULATORY_CARE_PROVIDER_SITE_OTHER): Payer: No Typology Code available for payment source | Admitting: Internal Medicine

## 2018-02-18 ENCOUNTER — Encounter (INDEPENDENT_AMBULATORY_CARE_PROVIDER_SITE_OTHER): Payer: Self-pay | Admitting: Student in an Organized Health Care Education/Training Program

## 2018-02-18 VITALS — BP 134/83 | HR 82 | Temp 98.8°F | Wt 197.2 lb

## 2018-02-18 DIAGNOSIS — G629 Polyneuropathy, unspecified: Secondary | ICD-10-CM

## 2018-02-18 DIAGNOSIS — E1065 Type 1 diabetes mellitus with hyperglycemia: Secondary | ICD-10-CM

## 2018-02-18 DIAGNOSIS — I10 Essential (primary) hypertension: Secondary | ICD-10-CM

## 2018-02-18 MED ORDER — GLUCOSE BLOOD VI STRP
ORAL_STRIP | 5 refills | Status: AC
Start: 2018-02-18 — End: ?
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
## 2018-02-18 NOTE — Patient Instructions (Signed)
Continue Levemir and Humalog for now. I will see you after the Dexcom to adjust your insulins and to get started on pump.  Have labs done.    - Monitor your BP regularly at home. Check it after you have been sitting down for at least 15 mins. No caffeine or tobacco products for at least 1 hr before checking your BP. If it is >130/80 persistently, then please discuss with your primary care doctor.       - Monitor your diet and exercise as tolerated and permitted, regularly    - Monitor your glucose regularly. Check your sugar 4 times each day.  Record your glucose values in a log.  Please bring your glucose readings or meter to your follow up appointments. If you are checking your sugars twice daily you can vary the times: before breakfast and dinner one day, and before lunch and 2 hours after dinner the next day.    - Your ideal Ideal blood sugar levels are 80 to 130 before and 100 to 180 2hr after meals.  - If your glucose levels are not in goal most of the times, then call us so we can adjust your medications.   - Watch for symptoms of low sugar (shakiness, weakness, confusion, lightheadedness, or sweats) If it is low (<70), have a small snack such as 4 oz of orange juice, 1 cup of milk, 4-5 pieces of hard candy or 3-4 glucose tablets.  Recheck sugar in 15 minutes.  If not improved, can repeat as needed.  - If you are having multiple episodes of low sugar, call us so we can adjust your medications.   - If your glucose is >250, make sure you check ketones to ensure you are not in DKA. If urine ketones are moderate to high, go to ER.     - Examine your feet every day. See foot doctor regularly  - You need eye exams at least once a year, more frequently if recommended by your eye doctor  - You need to see a dentist at least every 6 months    - Get labs done as prescribed. I will let you know once I get the results but if you dont hear from me within a week, let me know.     - Follow up in 1 week after the  monitor.

## 2018-02-19 ENCOUNTER — Ambulatory Visit (INDEPENDENT_AMBULATORY_CARE_PROVIDER_SITE_OTHER): Payer: Self-pay | Admitting: Adult Health

## 2018-02-20 ENCOUNTER — Other Ambulatory Visit: Payer: Self-pay | Admitting: Adult Health

## 2018-02-20 ENCOUNTER — Other Ambulatory Visit (FREE_STANDING_LABORATORY_FACILITY): Payer: No Typology Code available for payment source

## 2018-02-20 ENCOUNTER — Encounter (INDEPENDENT_AMBULATORY_CARE_PROVIDER_SITE_OTHER): Payer: Self-pay

## 2018-02-20 DIAGNOSIS — E7800 Pure hypercholesterolemia, unspecified: Secondary | ICD-10-CM

## 2018-02-20 DIAGNOSIS — R0602 Shortness of breath: Secondary | ICD-10-CM

## 2018-02-20 DIAGNOSIS — I2510 Atherosclerotic heart disease of native coronary artery without angina pectoris: Secondary | ICD-10-CM

## 2018-02-20 DIAGNOSIS — E1065 Type 1 diabetes mellitus with hyperglycemia: Secondary | ICD-10-CM

## 2018-02-20 DIAGNOSIS — I255 Ischemic cardiomyopathy: Secondary | ICD-10-CM

## 2018-02-20 LAB — BASIC METABOLIC PANEL
BUN: 27 mg/dL (ref 9.0–28.0)
CO2: 29 mEq/L (ref 21–29)
Calcium: 9.2 mg/dL (ref 8.5–10.5)
Chloride: 100 mEq/L (ref 100–111)
Creatinine: 1.4 mg/dL (ref 0.5–1.5)
Glucose: 261 mg/dL — ABNORMAL HIGH (ref 70–100)
Potassium: 5 mEq/L (ref 3.5–5.1)
Sodium: 137 mEq/L (ref 136–145)

## 2018-02-20 LAB — MICROALBUMIN, RANDOM URINE
Urine Creatinine, Random: 134.1 mg/dL
Urine Microalbumin, Random: >2000 — ABNORMAL HIGH (ref 0.0–30.0)

## 2018-02-20 LAB — GLUCOSE, FASTING: Glucose Fasting: 255 mg/dL — ABNORMAL HIGH (ref 70–110)

## 2018-02-20 LAB — LIPID PANEL
Cholesterol / HDL Ratio: 6
Cholesterol: 318 mg/dL — ABNORMAL HIGH (ref 0–199)
HDL: 53 mg/dL (ref 40–9999)
LDL Calculated: 216 mg/dL — ABNORMAL HIGH (ref 0–99)
Triglycerides: 244 mg/dL — ABNORMAL HIGH (ref 34–149)
VLDL Calculated: 49 mg/dL — ABNORMAL HIGH (ref 10–40)

## 2018-02-20 LAB — GFR: EGFR: 51.9

## 2018-02-20 LAB — HEMOLYSIS INDEX: Hemolysis Index: 7 (ref 0–18)

## 2018-02-20 LAB — IHS D-DIMER: D-Dimer: 0.53 ug/mL FEU (ref 0.00–0.70)

## 2018-02-20 NOTE — Progress Notes (Signed)
Patient walked in to drop off  mobility scooter forms  to be completed and signed by provider.

## 2018-02-21 LAB — C-PEPTIDE: C-Peptide: 1.3 ng/mL (ref 1.1–4.4)

## 2018-02-23 ENCOUNTER — Encounter: Payer: Self-pay | Admitting: Internal Medicine

## 2018-02-24 ENCOUNTER — Other Ambulatory Visit: Payer: Self-pay | Admitting: Adult Health

## 2018-02-24 ENCOUNTER — Telehealth (INDEPENDENT_AMBULATORY_CARE_PROVIDER_SITE_OTHER): Payer: Self-pay | Admitting: Student in an Organized Health Care Education/Training Program

## 2018-02-24 ENCOUNTER — Encounter (INDEPENDENT_AMBULATORY_CARE_PROVIDER_SITE_OTHER): Payer: Self-pay | Admitting: Student in an Organized Health Care Education/Training Program

## 2018-02-24 DIAGNOSIS — R0602 Shortness of breath: Secondary | ICD-10-CM

## 2018-02-24 NOTE — Telephone Encounter (Signed)
I start PA for mobility scooter PA # [id number] it will be take 72 hours to review

## 2018-02-25 ENCOUNTER — Ambulatory Visit: Payer: No Typology Code available for payment source | Attending: Podiatric Surgery

## 2018-02-25 ENCOUNTER — Other Ambulatory Visit: Payer: No Typology Code available for payment source

## 2018-02-25 DIAGNOSIS — E1165 Type 2 diabetes mellitus with hyperglycemia: Secondary | ICD-10-CM | POA: Insufficient documentation

## 2018-02-25 DIAGNOSIS — Z95820 Peripheral vascular angioplasty status with implants and grafts: Secondary | ICD-10-CM | POA: Insufficient documentation

## 2018-02-25 DIAGNOSIS — E1143 Type 2 diabetes mellitus with diabetic autonomic (poly)neuropathy: Secondary | ICD-10-CM | POA: Insufficient documentation

## 2018-02-25 DIAGNOSIS — S91301A Unspecified open wound, right foot, initial encounter: Secondary | ICD-10-CM | POA: Insufficient documentation

## 2018-02-25 DIAGNOSIS — X110XXA Contact with hot water in bath or tub, initial encounter: Secondary | ICD-10-CM | POA: Insufficient documentation

## 2018-02-25 DIAGNOSIS — E1140 Type 2 diabetes mellitus with diabetic neuropathy, unspecified: Secondary | ICD-10-CM | POA: Insufficient documentation

## 2018-02-25 DIAGNOSIS — Z87891 Personal history of nicotine dependence: Secondary | ICD-10-CM | POA: Insufficient documentation

## 2018-02-25 DIAGNOSIS — Z951 Presence of aortocoronary bypass graft: Secondary | ICD-10-CM | POA: Insufficient documentation

## 2018-02-25 DIAGNOSIS — E781 Pure hyperglyceridemia: Secondary | ICD-10-CM | POA: Insufficient documentation

## 2018-02-25 DIAGNOSIS — I10 Essential (primary) hypertension: Secondary | ICD-10-CM | POA: Insufficient documentation

## 2018-02-25 DIAGNOSIS — S91301D Unspecified open wound, right foot, subsequent encounter: Secondary | ICD-10-CM

## 2018-02-25 DIAGNOSIS — Z794 Long term (current) use of insulin: Secondary | ICD-10-CM | POA: Insufficient documentation

## 2018-02-25 DIAGNOSIS — Z7982 Long term (current) use of aspirin: Secondary | ICD-10-CM | POA: Insufficient documentation

## 2018-02-25 NOTE — Telephone Encounter (Signed)
Patient called to inform pcp and nurse that mobility scooter does not have a cpt code and will not be approved by insurance; would like pcp to contact insurance to discuss other options.    Patient would like a call back to follow up:  [phone]    Thank you

## 2018-02-25 NOTE — Telephone Encounter (Signed)
I called and talked insurance company last 2 days they don't do anything without CPT code and I don't know what other option. Is he need to contact with insurance and find out

## 2018-02-25 NOTE — Telephone Encounter (Signed)
Dr. Leonard Schwartz,     What do you think? We don't do DME here, should we refer him to a specialist so they can help him process his request? I will call him but I need to make sure I have all my ducks in a row.

## 2018-02-25 NOTE — Telephone Encounter (Signed)
Sure, I don't have a CPT code , he has neuropathy and poor balance for which he is seeing a neurologist.     Thanks for assisting and informing the pt     Hans Rusher Khatib Carmela Piechowski

## 2018-02-26 ENCOUNTER — Ambulatory Visit: Payer: No Typology Code available for payment source

## 2018-02-26 VITALS — BP 146/75 | Ht 74.0 in | Wt 199.4 lb

## 2018-02-26 DIAGNOSIS — E1065 Type 1 diabetes mellitus with hyperglycemia: Secondary | ICD-10-CM
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
## 2018-02-27 ENCOUNTER — Other Ambulatory Visit (FREE_STANDING_LABORATORY_FACILITY): Payer: No Typology Code available for payment source

## 2018-02-27 DIAGNOSIS — I255 Ischemic cardiomyopathy: Secondary | ICD-10-CM

## 2018-02-27 LAB — BASIC METABOLIC PANEL
BUN: 37 mg/dL — ABNORMAL HIGH (ref 9.0–28.0)
CO2: 28 mEq/L (ref 21–29)
Calcium: 9.6 mg/dL (ref 8.5–10.5)
Chloride: 98 mEq/L — ABNORMAL LOW (ref 100–111)
Creatinine: 1.4 mg/dL (ref 0.5–1.5)
Glucose: 230 mg/dL — ABNORMAL HIGH (ref 70–100)
Potassium: 4.8 mEq/L (ref 3.5–5.1)
Sodium: 137 mEq/L (ref 136–145)

## 2018-02-27 LAB — HEMOLYSIS INDEX: Hemolysis Index: 2 (ref 0–18)

## 2018-02-27 LAB — B-TYPE NATRIURETIC PEPTIDE: B-Natriuretic Peptide: 188 pg/mL — ABNORMAL HIGH (ref 0–100)

## 2018-02-27 LAB — GFR: EGFR: 51.9

## 2018-02-27 NOTE — Telephone Encounter (Signed)
I spoke to the patient, he will be bringing some more additional information for this DME pre-auth.   Catie - he will be here on Monday so maybe you can touch base with him then?   Thank you!

## 2018-03-02 ENCOUNTER — Other Ambulatory Visit (INDEPENDENT_AMBULATORY_CARE_PROVIDER_SITE_OTHER): Payer: Self-pay

## 2018-03-02 ENCOUNTER — Encounter (INDEPENDENT_AMBULATORY_CARE_PROVIDER_SITE_OTHER): Payer: Self-pay | Admitting: Student in an Organized Health Care Education/Training Program

## 2018-03-02 ENCOUNTER — Inpatient Hospital Stay
Admission: RE | Admit: 2018-03-02 | Discharge: 2018-03-02 | Disposition: A | Discharge status: Home / Self Care | Payer: No Typology Code available for payment source | Source: Ambulatory Visit | Attending: Adult Health | Admitting: Adult Health

## 2018-03-02 ENCOUNTER — Ambulatory Visit (INDEPENDENT_AMBULATORY_CARE_PROVIDER_SITE_OTHER)
Payer: No Typology Code available for payment source | Admitting: Student in an Organized Health Care Education/Training Program

## 2018-03-02 ENCOUNTER — Ambulatory Visit (INDEPENDENT_AMBULATORY_CARE_PROVIDER_SITE_OTHER): Payer: Self-pay | Admitting: Cardiovascular Disease

## 2018-03-02 VITALS — BP 125/79 | HR 74 | Temp 98.1°F | Resp 20 | Wt 200.8 lb

## 2018-03-02 DIAGNOSIS — I2583 Coronary atherosclerosis due to lipid rich plaque: Secondary | ICD-10-CM

## 2018-03-02 DIAGNOSIS — E1069 Type 1 diabetes mellitus with other specified complication: Secondary | ICD-10-CM

## 2018-03-02 DIAGNOSIS — L97502 Non-pressure chronic ulcer of other part of unspecified foot with fat layer exposed: Secondary | ICD-10-CM

## 2018-03-02 DIAGNOSIS — R42 Dizziness and giddiness: Secondary | ICD-10-CM

## 2018-03-02 DIAGNOSIS — R0602 Shortness of breath: Secondary | ICD-10-CM | POA: Insufficient documentation

## 2018-03-02 DIAGNOSIS — I255 Ischemic cardiomyopathy: Secondary | ICD-10-CM | POA: Insufficient documentation

## 2018-03-02 DIAGNOSIS — I2510 Atherosclerotic heart disease of native coronary artery without angina pectoris: Secondary | ICD-10-CM | POA: Insufficient documentation

## 2018-03-02 DIAGNOSIS — R413 Other amnesia: Secondary | ICD-10-CM

## 2018-03-02 DIAGNOSIS — E1142 Type 2 diabetes mellitus with diabetic polyneuropathy: Secondary | ICD-10-CM

## 2018-03-02 DIAGNOSIS — E7800 Pure hypercholesterolemia, unspecified: Secondary | ICD-10-CM | POA: Insufficient documentation

## 2018-03-02 DIAGNOSIS — I219 Acute myocardial infarction, unspecified: Secondary | ICD-10-CM | POA: Insufficient documentation

## 2018-03-02 DIAGNOSIS — E782 Mixed hyperlipidemia: Secondary | ICD-10-CM

## 2018-03-02 DIAGNOSIS — M609 Myositis, unspecified: Secondary | ICD-10-CM

## 2018-03-02 DIAGNOSIS — T466X5A Adverse effect of antihyperlipidemic and antiarteriosclerotic drugs, initial encounter: Secondary | ICD-10-CM

## 2018-03-02 DIAGNOSIS — R2689 Other abnormalities of gait and mobility: Secondary | ICD-10-CM

## 2018-03-02 DIAGNOSIS — I119 Hypertensive heart disease without heart failure: Secondary | ICD-10-CM | POA: Insufficient documentation

## 2018-03-02 MED ORDER — TECHNETIUM TC 99M TETROFOSMIN IV KIT
35.00 | PACK | Freq: Once | INTRAVENOUS | Status: AC | PRN
Start: 2018-03-02 — End: 2018-03-02
  Administered 2018-03-02: 11:00:00 35 via INTRAVENOUS
  Filled 2018-03-02: qty 100

## 2018-03-02 MED ORDER — TECHNETIUM TC 99M TETROFOSMIN IV KIT
10.00 | PACK | Freq: Once | INTRAVENOUS | Status: AC | PRN
Start: 2018-03-02 — End: 2018-03-02
  Administered 2018-03-02: 10:00:00 10 via INTRAVENOUS
  Filled 2018-03-02: qty 100

## 2018-03-02 MED ORDER — REGADENOSON 0.4 MG/5ML IV SOLN
0.40 mg | Freq: Once | INTRAVENOUS | Status: AC | PRN
Start: 2018-03-02 — End: 2018-03-02
  Administered 2018-03-02: 11:00:00 0.4 mg via INTRAVENOUS
  Filled 2018-03-02: qty 5

## 2018-03-02 MED ORDER — EVOLOCUMAB 140 MG/ML SC SOAJ
1.00 mL | SUBCUTANEOUS | 3 refills | Status: AC
Start: 2018-03-02 — End: ?

## 2018-03-02 NOTE — Progress Notes (Signed)
Have you seen any specialists/other providers since your last visit with [place]?    No    Arm preference verified?   Yes    The patient is due for eye exam, foot exam, influenza vaccine, shingles vaccine and [facility]

## 2018-03-02 NOTE — Progress Notes (Signed)
Per MD referral, Nurse Navigator met with the patient to introduce role of nurse navigator and to provide information on SPN Nurse Navigator, Heron Sabins who will be following the patient for his multiple needs. Pt verbalized understanding and is looking forward to working with [employer].     Dr. Salina April made aware of NN assignment.     Catie Mirza Fessel BA, BSN, Warehouse manager Care Nurse Navigator  [address]  [place], [place] [address]  [phone]

## 2018-03-03 ENCOUNTER — Other Ambulatory Visit (INDEPENDENT_AMBULATORY_CARE_PROVIDER_SITE_OTHER): Payer: Self-pay

## 2018-03-03 ENCOUNTER — Encounter (INDEPENDENT_AMBULATORY_CARE_PROVIDER_SITE_OTHER): Payer: Self-pay | Admitting: Student in an Organized Health Care Education/Training Program
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
## 2018-03-04 ENCOUNTER — Inpatient Hospital Stay
Payer: No Typology Code available for payment source | Attending: Student in an Organized Health Care Education/Training Program

## 2018-03-04 ENCOUNTER — Ambulatory Visit: Payer: No Typology Code available for payment source | Attending: Podiatric Surgery

## 2018-03-04 DIAGNOSIS — S91301A Unspecified open wound, right foot, initial encounter: Secondary | ICD-10-CM | POA: Insufficient documentation

## 2018-03-04 DIAGNOSIS — M6281 Muscle weakness (generalized): Secondary | ICD-10-CM | POA: Insufficient documentation
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
## 2018-03-04 NOTE — Progress Notes (Signed)
03/03/18 1701   General Assessment   Assessment completed with patient   Cognitive Issues No   Patient lives with spouse   Capacity/Guardianship Issue No   Pain Yes  (neuropathy)   Severity of pain Chronic   Housing single family/private residence   Support system spouse   Current medical services Wound care services   DME Used Other  (cane for balance)   Limited Mobility Yes   Limited Mobility List Walking   Transportation issues No   Medication  No   Financial  No   Abuse or neglect No   Substance Abuse No   Housing concerns No   Visual Impairment Yes   Visual Impairment List Glasses   Hearing Impaired No   Diet - Type ADA;Low Salt   Exercise No   Exercise Barriers Poor Mobility

## 2018-03-05 ENCOUNTER — Ambulatory Visit
Admission: RE | Admit: 2018-03-05 | Discharge: 2018-03-05 | Disposition: A | Discharge status: Home / Self Care | Payer: No Typology Code available for payment source | Source: Ambulatory Visit | Attending: Adult Health | Admitting: Adult Health

## 2018-03-05 ENCOUNTER — Ambulatory Visit (INDEPENDENT_AMBULATORY_CARE_PROVIDER_SITE_OTHER): Payer: No Typology Code available for payment source

## 2018-03-05 ENCOUNTER — Other Ambulatory Visit (INDEPENDENT_AMBULATORY_CARE_PROVIDER_SITE_OTHER): Payer: Self-pay

## 2018-03-05 DIAGNOSIS — I080 Rheumatic disorders of both mitral and aortic valves: Secondary | ICD-10-CM | POA: Insufficient documentation

## 2018-03-05 DIAGNOSIS — I5189 Other ill-defined heart diseases: Secondary | ICD-10-CM | POA: Insufficient documentation

## 2018-03-05 DIAGNOSIS — E1065 Type 1 diabetes mellitus with hyperglycemia: Secondary | ICD-10-CM

## 2018-03-05 DIAGNOSIS — I255 Ischemic cardiomyopathy: Secondary | ICD-10-CM | POA: Insufficient documentation

## 2018-03-05 DIAGNOSIS — R0602 Shortness of breath: Secondary | ICD-10-CM | POA: Insufficient documentation
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
## 2018-03-06 ENCOUNTER — Ambulatory Visit (INDEPENDENT_AMBULATORY_CARE_PROVIDER_SITE_OTHER): Payer: No Typology Code available for payment source | Admitting: Internal Medicine

## 2018-03-06 ENCOUNTER — Encounter: Payer: Self-pay | Admitting: Internal Medicine

## 2018-03-06 ENCOUNTER — Encounter (INDEPENDENT_AMBULATORY_CARE_PROVIDER_SITE_OTHER): Payer: Self-pay | Admitting: Student in an Organized Health Care Education/Training Program

## 2018-03-06 VITALS — BP 114/71 | HR 57 | Temp 96.0°F | Ht 74.0 in | Wt 199.0 lb

## 2018-03-06 DIAGNOSIS — G629 Polyneuropathy, unspecified: Secondary | ICD-10-CM

## 2018-03-06 DIAGNOSIS — E1065 Type 1 diabetes mellitus with hyperglycemia: Secondary | ICD-10-CM

## 2018-03-06 MED ORDER — METFORMIN HCL ER 750 MG PO TB24
750.00 mg | ORAL_TABLET | Freq: Two times a day (BID) | ORAL | 4 refills | Status: DC
Start: 2018-03-06 — End: 2018-04-28

## 2018-03-06 NOTE — Progress Notes (Signed)
[name] is a 57 y.o. male who presents today for a f/u visit for type 1 diabetes.  PCP: Bouhouch, Jacqualyn Posey, MD  Patient is referred by: PCP    Pt is here to have his Dexcom reviewed.    ##DM:  The patient's diagnosis of diabetes mellitus was made when he was 57yrs old, was initially told he was type 1 diabetic, but later was told he was probably type 2 diabetic 6 years back, but has always been managed on insulin.  He was diagnosed at age 38yrs, when he had polyuria, polydipsia, had BG >1000, was in the hospital. Never had DKA but has been having urine ketones on and off over the last 3-4 years per pt.  He states he has never been off of insulin for more than a day since his diagnosis. Family h/o DM2 in his mother.    Patient is currently on:   Levemir 50u bid  Humalog based on portions of carbs and SS (1 unit for every 20 above 120). (1:5 carb ratio)    Medications not tolerated in past or contraindications: Metformin    Medication dosage review with [facility] suggested compliance most of the time.    Pt had h/o pancreatitis 2 yrs back.     Patient returns to discuss the results of his/her recent continuous glucose monitor (CGM) results.  The CGM was performed between 02/27/18 and 03/05/18  A target blood glucose (BG) between 80 and 180 was defined  26% of patient's BG readings were within target  74% of BG readings were above target  0% of BG readings were below target  Avg glucose 209, SD 41  Calibrations 3.1/d    No severe hypoglycemia occurred    CGM glucose pattern showed: Global hyperglycemia and postprandial hyperglycemia        Diet:  Semicompliant with diet, eats 3 meals/d  Prior formal diabetes education/ nutritional counseling has been performed, he thinks he knows very well and does not need to see one.    Exercise:  Limited from neuropathy      Diabetic complications:   Cardiovascular: CAD s/p CABG 2018, PCI 2013  Neuropathy: Yes. Last foot exam was today done here , following with wound  care for rt foot ulcer.  Retinopathy: Yes, h/o laser. Last eye exam was 12/18.   Renal: Yes, h/o proteinuria per pt.  Yes ACE-I/ARB  No Statin, did not tolerate due to jt pains, on Zetia.  BP: at goal      Past Medical History:   Diagnosis Date   . Dehydration    . Diabetes mellitus    . Dupre's syndrome    . Hypertension    . Migraine    . Neuropathy    . Pancreatitis    . Sleep apnea    . Type 1 diabetes mellitus        Past Surgical History:   Procedure Laterality Date   . BONE GRAFT     . BYPASS, CAROTID-SUBCLAVIAN     . CARDIAC CATHETERIZATION     . EYE SURGERY     . WRIST SURGERY       Family History   Problem Relation Age of Onset   . Diabetes Mother    . Heart disease Father        Allergies   Allergen Reactions   . Statins      Other reaction(s): Arthralgia (Joint Pain)   . Lyrica [Pregabalin]      Nerve  pain, out of body experiences     Social History     Socioeconomic History   . Marital status: Married     Spouse name: Not on file   . Number of children: Not on file   . Years of education: Not on file   . Highest education level: Not on file   Occupational History   . Not on file   Social Needs   . Financial resource strain: Not on file   . Food insecurity:     Worry: Not on file     Inability: Not on file   . Transportation needs:     Medical: Not on file     Non-medical: Not on file   Tobacco Use   . Smoking status: Former Smoker     Packs/day: 0.00   . Smokeless tobacco: Never Used   . Tobacco comment: quit 7 years ago    Substance and Sexual Activity   . Alcohol use: Yes     Alcohol/week: 1.0 standard drinks     Types: 1 Cans of beer per week     Comment: 1 pack in 6 months.   . Drug use: No   . Sexual activity: Never   Lifestyle   . Physical activity:     Days per week: Not on file     Minutes per session: Not on file   . Stress: Not on file   Relationships   . Social connections:     Talks on phone: Not on file     Gets together: Not on file     Attends religious service: Not on file     Active  member of club or organization: Not on file     Attends meetings of clubs or organizations: Not on file     Relationship status: Not on file   . Intimate partner violence:     Fear of current or ex partner: Not on file     Emotionally abused: Not on file     Physically abused: Not on file     Forced sexual activity: Not on file   Other Topics Concern   . Not on file   Social History Narrative   . Not on file     Current Outpatient Medications on File Prior to Visit   Medication Sig Dispense Refill   . aspirin 81 MG chewable tablet Chew 81 mg by mouth daily     . carvedilol (COREG) 6.25 MG tablet Take 6.25 mg by mouth every 12 (twelve) hours     . ezetimibe (ZETIA) 10 MG tablet ezetimibe 10 mg tablet     . furosemide (LASIX) 40 MG tablet 40 mg daily        . glucose blood (ONETOUCH VERIO) test strip Check blood sugars 4 xday 120 each 5   . insulin detemir (LEVEMIR FLEXTOUCH) 100 UNIT/ML injection pen Levemir FlexTouch U-100 Insulin 100 unit/mL (3 mL) subcutaneous pen : 50  Units SC bid 30 pen 1   . insulin lispro (HUMALOG KWIKPEN) 100 UNIT/ML injection pen Humalog KwikPen (U-100) Insulin 100 unit/mL subcutaneous per SSI 1-20 units premeals 15 pen 1   . Insulin Pen Needle 31G X 8 MM Misc Use with insulin pen 1000 each 3   . sacubitril-valsartan (ENTRESTO) 24-26 MG Tab per tablet 2 (two) times daily        . Evolocumab 140 MG/ML Solution Auto-injector Inject 1 mL into the skin every 14 (fourteen) days 12  pen 3   . ondansetron (ZOFRAN-ODT) 4 MG disintegrating tablet Take 1 tablet (4 mg total) by mouth every 6 (six) hours as needed for Nausea 12 tablet 0     No current facility-administered medications on file prior to visit.          Review of Systems  Constitutional: Negative for fever.   Respiratory: + for shortness of breath on exertion    Cardiovascular: Negative for chest pain.  Gastrointestinal: Negative for vomiting and abdominal pain.           Objective:     Vitals:    03/06/18 1411   BP: 114/71   BP Site:  Right arm   Patient Position: Sitting   Cuff Size: Medium   Pulse: (!) 57   Temp: (!) 96 F (35.6 C)   TempSrc: Oral   SpO2: 97%   Weight: 90.3 kg (199 lb)   Height: 1.88 m (6\' 2" )      General appearance - alert, well appearing, and in no distress  Mental status - alert, oriented to person, place, and time, normal mood, behavior, speech, dress, motor activity, and thought processes  HEENT- normocephalic, atraumatic, anicteric sclerae, EOMI, mucous membranes moist  Neurological - alert, oriented, normal speech, no focal findings or movement disorder noted  Musculoskeletal - no joint tenderness, deformity or swelling  Extremities - peripheral pulses decreased, no edema  Skin - normal color and turgor, no rashes, see foot exam below      Lab Review       Ref. Range 02/12/2018 15:08 02/13/2018 13:55   Glucose Latest Ref Range: 70 - 100 mg/dL  540 (H)   BUN Latest Ref Range: 9 - 28 mg/dL  21   Creatinine Latest Ref Range: 0.7 - 1.3 mg/dL  1.4 (H)   Sodium Latest Ref Range: 136 - 145 mEq/L  130 (L)   Potassium Latest Ref Range: 3.5 - 5.1 mEq/L  5.5 (H)   Chloride Latest Ref Range: 100 - 111 mEq/L  97 (L)   Carbon Dioxide, Whole Blood Latest Ref Range: 22 - 29 mEq/L  22   Calcium Latest Ref Range: 8.5 - 10.5 mg/dL  9.1   Anion Gap Latest Ref Range: 5.0 - 15.0   11.0   EGFR Unknown  51.9   AST Whole Blood Latest Ref Range: 5 - 34 U/L  19   ALT, Whole Blood Latest Ref Range: 0 - 55 U/L  19   Alkaline Phosphatase Latest Ref Range: 38 - 106 U/L  81   Albumin Latest Ref Range: 3.5 - 5.0 g/dL  3.2 (L)   Protein, Total Latest Ref Range: 6.0 - 8.3 g/dL  6.4   Globulin Latest Ref Range: 2.0 - 3.6 g/dL  3.2   Albumin/Globulin Ratio Latest Ref Range: 0.9 - 2.2   1.0   Bilirubin, Total Latest Ref Range: 0.2 - 1.2 mg/dL  1.0   Lipase Latest Ref Range: 8 - 78 U/L  71   Troponin I Latest Ref Range: 0.00 - 0.09 ng/mL  0.03   Hemoglobin A1C Latest Ref Range: 3.9 - 5.9 % 12 (A)      Lab Results   Component Value Date    WBC 10.54 (H)  02/13/2018    HGB 16.1 02/13/2018    HCT 46.6 02/13/2018    PLT 290 02/13/2018    CHOL 318 (H) 02/20/2018    TRIG 244 (H) 02/20/2018    HDL 53 02/20/2018    LDL  216 (H) 02/20/2018    ALT 19 02/13/2018    AST 19 02/13/2018    NA 137 02/27/2018    K 4.8 02/27/2018    CL 98 (L) 02/27/2018    CREAT 1.4 02/27/2018    BUN 37.0 (H) 02/27/2018    CO2 28 02/27/2018    GLU 230 (H) 02/27/2018    HGBA1C 12 (A) 02/12/2018         Assessment:   [name] is a 57 y.o. -year-old male who presents today for a new visit for type 1 diabetes, under poor control.          Plan:   ##DM:  Neuropathy  CAD s/p CABG  Retinopathy  CKD  Most recent A1c was 12 done in 9/19 which is above goal. Goal for this patient is 7.    CGMS shows global hyperglycemia.    HE is requiring ~150u/d with BGS still in 200s and 300s, pt reports compliance.  Will add Metformin.    Continue Levemir 60 units twice daily.    Take Humalog 1 unit for every 5 grams of carbohydrates (or 10 units for every meal) + additional insulin for correction of 1 unit for every 15 above 120.      Start Metformin XR 750mg  daily for 3 days, then increase to twice daily.      HTN:  BP in clinic was atgoal. Recommend that patient monitors BP at home regularly, and discuss with PCP if running high.          Renaee Munda, MD, [facility]  [address]  [place], [place] [address]  Tel: [phone]  Fax: [phone]

## 2018-03-06 NOTE — Patient Instructions (Addendum)
Continue Levemir 60 units twice daily.    Take Humalog 1 unit for every 5 grams of carbohydrates (or 10 units for every meal) + additional insulin for correction of 1 unit for every 15 above 120.      Start Metformin XR 750mg  daily for 3 days, then increase to twice daily.

## 2018-03-09 ENCOUNTER — Inpatient Hospital Stay
Payer: No Typology Code available for payment source | Attending: Student in an Organized Health Care Education/Training Program

## 2018-03-09 ENCOUNTER — Other Ambulatory Visit: Payer: No Typology Code available for payment source

## 2018-03-09 VITALS — BP 159/81 | HR 75

## 2018-03-09 DIAGNOSIS — M6281 Muscle weakness (generalized): Secondary | ICD-10-CM | POA: Insufficient documentation

## 2018-03-09 LAB — BASIC METABOLIC PANEL
BUN: 25 mg/dL (ref 9.0–28.0)
CO2: 26 mEq/L (ref 21–29)
Calcium: 9.1 mg/dL (ref 8.5–10.5)
Chloride: 100 mEq/L (ref 100–111)
Creatinine: 1.5 mg/dL (ref 0.5–1.5)
Glucose: 322 mg/dL — ABNORMAL HIGH (ref 70–100)
Potassium: 5.5 mEq/L — ABNORMAL HIGH (ref 3.5–5.1)
Sodium: 137 mEq/L (ref 136–145)

## 2018-03-09 LAB — HEMOLYSIS INDEX: Hemolysis Index: 5 (ref 0–18)

## 2018-03-09 LAB — GFR: EGFR: 47.9

## 2018-03-09 NOTE — PT/OT Therapy Note (Signed)
Name: [name] Age: 57 y.o.   Date of Service: 03/09/2018  Referring Physician: Noel Christmas*   Date of Injury: 12/28/2017  Date Care Plan Established/Reviewed: 03/04/2018  Date Treatment Started: 03/04/2018  End of Certification Date: 05/02/2018  Sessions in Plan of Care: 6  Surgery Date: No data was found    Visit Count: 2   Diagnosis:   1. Muscle weakness        Subjective     Pain   Patient reports he had an echo last week and the cardiologist told him his results were stable. However he felt pain in his chest this morning "at the heart beat." Patient reports he feels fine now and he is not dizzy while sitting but he has to take a second each time he stands because he gets dizzy. Pt has another cardiologist appointment later this week. He hasn't been able to start his exercises over this past weekend yet because his wife was not home to guard him.  Pt states he is in the process of getting a mobility chair.     Social Support/Occupation  Lives in: multiple level home  Lives with: spouse  Occupation: IT      Precautions: cardiac  diabetes  Allergies: Statins and Lyrica [pregabalin]           BP: 159/81 Heart Rate: 75    Treatment     Therapeutic Exercises   Justification: To improve ROM, Flexibility and Strength   See flow sheet.   Gait belt used.     Short sitting:  HS and gastroc stretch on R 3x30''.      Verbal and tactile cuing on proper squat mechanics to prevent unnecessary stress to LB and knees.     Neuromuscular Re-Education   Justification:  To improve proprioception and motor control.  In // bars:   Tandem walking fwd/bwd 4x   Tandem balance 3x30'' ea   Alternating step tapping on 6'' step: 1' ea for 3' with 1-2 minute break in between    Verbal and tactile cuing to maintain forward gaze and clear feet from ground by exaggerating hip flexion/ankle DF. Exercises to challenge glute med activation to facilitate hip stability during gait.     Manual Therapy   Justification: To improve soft  tissue and joint mobility.   None    Name: [name]  Referring Physician: Noel Christmas*  Diagnosis:     ICD-10-CM    1. Muscle weakness M62.81                    Exercise Flow Sheet    Exercise Specifics 03/09/18               Hip ER ROM Short sitting/ warm up  2x10 ea  AP               Mini squats In // 3x5 with 1-2' break in b/w sets  AP             Seated calf raises    20x  Pressure on L>R  AP  Home Exercise Program                 (Initials = supervised exercise by clinician)       ---      ---   Total Time   Timed Minutes  60 minutes   Total Time  60 minutes        Assessment   Patient required numerous rest breaks between sets in order to low exercise tolerance and to prevent LE fatigue that would prevent him from getting to his car and completing his ADLs for the rest of the day. Close contact guard required with use of a gait belt due to unexpected episodes of dizziness. Pt educated to clear the ground during ambulation by exaggerating hip flexion/anke DF to prevent falls. Pt required HHA during balance activities and cueing to maintain a forward gaze to prevent over-reliance on vision.     Pre vitals: 159/81, 75 bpm. Post tx vitals: 140/76, 78 bpm.     Justification: to improve LE strength and proprioception to reduce fall risk.   Plan   Assess patients response to PT and HEP.       Goals    Goal 1:  Increase TUG score from 22 sec to < 15 sec, to be able to walk > 7 minutes without fall/LOB.    Sessions:  6      Goal 2:  Increase Quad Strength > 45 psi, to be able to negotiate stairs reciprocally, with bannister assistance.   Sessions:  6      Goal 3:  Increase hip abduction strength > 42 psi to be able to independently transfer in/out of vehicle.    Sessions:  6      Goal 4:  Increase FOTO score form 49 to > 59, to be able to get out of low  chair/vehicle.    Sessions:  6          Goal 5:  Patient will demonstrate independence in prescribed HEP with proper form, sets and reps for safe discharge to an independent program.   Sessions:  6                         Gilmer Mor, PT

## 2018-03-10 LAB — VAHRT HISTORIC LVEF
Ejection Fraction: 50 %
Ejection Fraction: 50 %

## 2018-03-11 ENCOUNTER — Encounter (INDEPENDENT_AMBULATORY_CARE_PROVIDER_SITE_OTHER): Payer: Self-pay | Admitting: Student in an Organized Health Care Education/Training Program

## 2018-03-12 ENCOUNTER — Ambulatory Visit (INDEPENDENT_AMBULATORY_CARE_PROVIDER_SITE_OTHER): Payer: Self-pay | Admitting: Cardiology

## 2018-03-16 ENCOUNTER — Inpatient Hospital Stay
Payer: No Typology Code available for payment source | Attending: Student in an Organized Health Care Education/Training Program

## 2018-03-16 DIAGNOSIS — M6281 Muscle weakness (generalized): Secondary | ICD-10-CM | POA: Insufficient documentation

## 2018-03-16 NOTE — PT/OT Therapy Note (Signed)
Name: [name] Age: 57 y.o.   Date of Service: 03/16/2018  Referring Physician: Noel Christmas*   Date of Injury: 12/28/2017  Date Care Plan Established/Reviewed: 03/04/2018  Date Treatment Started: 03/04/2018  End of Certification Date: 05/02/2018  Sessions in Plan of Care: 6  Surgery Date: No data was found    Visit Count: 3   Diagnosis:   1. Muscle weakness            Subjective     Social Support/Occupation  Lives in: multiple level home  Lives with: spouse  Occupation: IT    Patient reports he went to the cardiologist last week and his heart is functioning at 50% now and he reduced his Lasix dosage to 20mg . His endocrinologist has put him on Metformin now. He arrived with no boot on his R foot and will be seeing wound care Wednesday. He was able to do his exercises over the weekend since his wife was at home.       Precautions: cardiac  diabetes  Allergies: Statins and Lyrica [pregabalin]         BP: 140/73      Treatment     Therapeutic Exercises   Justification: To improve flexibility, strength and ROM.   See flow sheet.     Bike warm up to gain subjective information to assist in developing todays tx session.     L calf stretch in sitting 3x30''     Education to perform mini squats at home to build LE strength.     Neuromuscular Re-Education   Justification: To improve motor control and proprioception.  In // with gait belt:   FT 30'' x 3  EO/EC  Tandem EO 30'' x 3 ea  Foam FT EO 3x30''   Foam Semi tandem EO 3x30'' ea   Fwd/bwd stepping on foam 4 laps  Side stepping on foam 4 laps   Alternating cone tapping 2x10  **1-5 minute breaks between ea exercise**   PT is guarding the patient with gait belt, cuing to maintain fwd visual gaze           Exercise Flow Sheet    Exercise Specifics 03/09/18 03/16/18              Hip ER ROM Short sitting/ warm up  2x10 ea  AP >              Mini squats In // 3x5 with 1-2' break in b/w sets  AP >            Seated calf raises    20x  Pressure on L>R  AP >               Bike Seat 15   4'   AP  Home Exercise Program                 (Initials = supervised exercise by clinician)       ---      ---   Total Time   Timed Minutes  45 minutes   Total Time  45 minutes        Assessment   Pt continues to require close contact guard during balance activities due to unexpected dizziness episodes. Pt demonstrates moderate sway with EO/EC balance exercises. EC added today to mimic shower environment. Pt able to progress to foam pad today, EO only. Difficulty and rapid onset of fatigue with cone tapping and side stepping due weakness in B glute med. Rest breaks required between sets to conserve energy.     Justification: to improve LE proprioception and strength to reduce fall risk.   Plan   Continue per POC. Continue working on/progressing balance exercises with EC.      Goals    Goal 1:  Increase TUG score from 22 sec to < 15 sec, to be able to walk > 7 minutes without fall/LOB.    Sessions:  6      Goal 2:  Increase Quad Strength > 45 psi, to be able to negotiate stairs reciprocally, with bannister assistance.   Sessions:  6      Goal 3:  Increase hip abduction strength > 42 psi to be able to independently transfer in/out of vehicle.    Sessions:  6      Goal 4:  Increase FOTO score form 49 to > 59, to be able to get out of low chair/vehicle.    Sessions:  6          Goal 5:  Patient will demonstrate independence in prescribed HEP with proper form, sets and reps for safe discharge to an independent program.   Sessions:  6                         Gilmer Mor, PT

## 2018-03-17 ENCOUNTER — Telehealth (HOSPITAL_BASED_OUTPATIENT_CLINIC_OR_DEPARTMENT_OTHER): Payer: Self-pay

## 2018-03-17 ENCOUNTER — Encounter (HOSPITAL_BASED_OUTPATIENT_CLINIC_OR_DEPARTMENT_OTHER): Payer: Self-pay | Admitting: Neurology

## 2018-03-17 ENCOUNTER — Encounter (HOSPITAL_BASED_OUTPATIENT_CLINIC_OR_DEPARTMENT_OTHER): Payer: Self-pay

## 2018-03-17 ENCOUNTER — Other Ambulatory Visit (FREE_STANDING_LABORATORY_FACILITY): Payer: No Typology Code available for payment source

## 2018-03-17 ENCOUNTER — Ambulatory Visit (INDEPENDENT_AMBULATORY_CARE_PROVIDER_SITE_OTHER): Payer: No Typology Code available for payment source | Admitting: Neurology

## 2018-03-17 VITALS — BP 138/95 | HR 80 | Resp 18 | Ht 74.0 in | Wt 199.2 lb

## 2018-03-17 DIAGNOSIS — G609 Hereditary and idiopathic neuropathy, unspecified: Secondary | ICD-10-CM

## 2018-03-17 DIAGNOSIS — E1142 Type 2 diabetes mellitus with diabetic polyneuropathy: Secondary | ICD-10-CM

## 2018-03-17 DIAGNOSIS — Z794 Long term (current) use of insulin: Secondary | ICD-10-CM

## 2018-03-17 DIAGNOSIS — R278 Other lack of coordination: Secondary | ICD-10-CM

## 2018-03-17 LAB — FOLATE: Folate: 9.7 ng/mL

## 2018-03-17 LAB — HEMOLYSIS INDEX: Hemolysis Index: 8 (ref 0–18)

## 2018-03-17 LAB — TSH: TSH: 1.5 u[IU]/mL (ref 0.35–4.94)

## 2018-03-17 LAB — VITAMIN B12: Vitamin B-12: 539 pg/mL (ref 211–911)
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
## 2018-03-18 ENCOUNTER — Telehealth (HOSPITAL_BASED_OUTPATIENT_CLINIC_OR_DEPARTMENT_OTHER): Payer: Self-pay

## 2018-03-18 ENCOUNTER — Ambulatory Visit: Payer: No Typology Code available for payment source | Attending: Podiatric Surgery

## 2018-03-18 DIAGNOSIS — S91301A Unspecified open wound, right foot, initial encounter: Secondary | ICD-10-CM | POA: Insufficient documentation

## 2018-03-18 DIAGNOSIS — S91301D Unspecified open wound, right foot, subsequent encounter: Secondary | ICD-10-CM

## 2018-03-19 ENCOUNTER — Telehealth (INDEPENDENT_AMBULATORY_CARE_PROVIDER_SITE_OTHER): Payer: Self-pay | Admitting: Student in an Organized Health Care Education/Training Program

## 2018-03-19 NOTE — Telephone Encounter (Signed)
Left v/m call back

## 2018-03-19 NOTE — Telephone Encounter (Signed)
Patient is returning a call regarding a doctor's note. Please advise,thank you.    Patient can be reached at [phone]

## 2018-03-19 NOTE — Telephone Encounter (Signed)
Patient called because he needs a doctor's note from Dr. Salina April stating that he can go back to work for his job. Please advise.         Patient contact info: [phone]            Thank you!

## 2018-03-20 NOTE — Telephone Encounter (Signed)
I talked with him . He said his cardiology clear him and he can go back to work now start yesterday, I told him we don't have forms to fill out he said he will send forms through my chart during weekend. I told him when we receive forms after Dr. Leonard Schwartz fill out I will fax [employer] and I will let pt know he seem understand.

## 2018-03-20 NOTE — Telephone Encounter (Signed)
x2 attempt  Patient is returning call from [facility]. Please return call at noted phone number below.      Mobile:   Telephone Information:   Mobile [phone]    X   Home: @HOMEPHONE @    Work: @WORKPHONE @

## 2018-03-21 LAB — VITAMIN B1, PLASMA: Vitamin B1 (Thiamine): 11 nmol/L (ref 8–30)

## 2018-03-23 ENCOUNTER — Encounter (INDEPENDENT_AMBULATORY_CARE_PROVIDER_SITE_OTHER): Payer: Self-pay | Admitting: Student in an Organized Health Care Education/Training Program

## 2018-03-23 ENCOUNTER — Telehealth (HOSPITAL_BASED_OUTPATIENT_CLINIC_OR_DEPARTMENT_OTHER): Payer: Self-pay

## 2018-03-23 ENCOUNTER — Telehealth (INDEPENDENT_AMBULATORY_CARE_PROVIDER_SITE_OTHER): Payer: Self-pay | Admitting: Student in an Organized Health Care Education/Training Program

## 2018-03-23 ENCOUNTER — Inpatient Hospital Stay
Payer: No Typology Code available for payment source | Attending: Student in an Organized Health Care Education/Training Program

## 2018-03-23 DIAGNOSIS — M6281 Muscle weakness (generalized): Secondary | ICD-10-CM | POA: Insufficient documentation

## 2018-03-23 NOTE — Telephone Encounter (Signed)
Please review

## 2018-03-23 NOTE — Telephone Encounter (Signed)
Message left for patient letting him know his labs are norma and to f/u with Dr Alphonzo Lemmings as needed.

## 2018-03-23 NOTE — PT/OT Therapy Note (Signed)
Name: [name] Age: 57 y.o.   Date of Service: 03/23/2018  Referring Physician: Noel Christmas*   Date of Injury: 12/28/2017  Date Care Plan Established/Reviewed: 03/04/2018  Date Treatment Started: 03/04/2018  End of Certification Date: 05/02/2018  Sessions in Plan of Care: 6  Surgery Date: No data was found    Visit Count: 4   Diagnosis:   1. Muscle weakness            Subjective     Pain   Current pain rating: 3    Social Support/Occupation  Lives in: multiple level home  Lives with: spouse  Occupation: IT    Pt reports he met with the neurologist last week and neuro said theres nothing that can be done about neuropathy. Denies that the new medication is making him less dizzy. Pt reports the burning in his legs occurs within 20-30 seconds of biking. He states he has about 3 minutes of pitting edema in his R calf that has subsided with elevation and movement over the weekend however its still about 1 mm. He further reports his final wound care visit is approaching and its healing appropriately.       Precautions: cardiac  diabetes  Allergies: Statins and Lyrica [pregabalin]               BP: 150/90 Heart Rate: 80    Treatment     Therapeutic Exercises   Justification: To improve flexibility, strength and ROM.   See flow sheet.     Bike warm up to gain subjective information to assist in developing todays tx session and FOTO.     VCing to brace abdomen during seated marching     Standing gastroc stretch 2x30'' ea at // bars with VC/TCing to maintain knee extension and heel contact with floor    1-2 min break in between sit to stands    Educated patient to add head turns to HEP with guarding from his wife and standing gastroc stretch.     Educated patient that while biking, pedal until the onset of burning and take a break. Asked to take note of any time improvement/symptom onset with this interval method.     Neuromuscular Re-Education   Justification: To improve motor control and proprioception.  In //  with gait belt:   Tandem EO 30'' x ea fwd gaze, up, down, right and left (5'' each direction)     PT is guarding the patient with gait belt, cuing to maintain fwd visual gaze           Exercise Flow Sheet    Exercise Specifics 03/09/18 03/16/18 03/23/18             Hip ER ROM Short sitting/ warm up  2x10 ea  AP > >             Mini squats In // 3x5 with 1-2' break in b/w sets  AP > >           Seated calf raises    20x  Pressure on L>R  AP > >             Bike Seat 15   4'   AP 5'  AP             LAQ Short sitting   10x ea   AP           Marching   Short sitting   20x   AP  HS curls   Standing in // bars   20x   AP              Sit to stands With airex pad on chair    8x (1-2 min break in b/w)  AP             Vectors HHA on right in //   L LE only  3x5'' for 3 sets  AP                                             Home Exercise Program                 (Initials = supervised exercise by clinician)       ---      ---   Total Time   Timed Minutes  50 minutes   Total Time  50 minutes        Assessment   PT added head movements during balance activity to challenge vestibular system. Pt demo moderate sway in frontal plane contacting both parallel bars. Low tolerance for sit to stands today due to L LE fatiguing and subjective reporting of his L LE giving out on him. Educated pt about interval training to reduce burning senstion while biking this week before next session. Pt demo moderate difficulty with vectors and required a firm grasp to maintain upright balance. Pt encouraged to work on energy conservation techniques in order to reduce LE fatigue to avoid falls.       Justification: to improve LE proprioception and strength to reduce fall risk.   Plan   Continue per POC. Continue working on/progressing balance exercises with EC/head.         Updated FOTO:  03/23/18 FOTO score 43 (IE 49)    Goals    Goal 1:  Increase TUG score from 22 sec to < 15 sec, to be able to walk > 7 minutes without fall/LOB.    Sessions:   6      Goal 2:  Increase Quad Strength > 45 psi, to be able to negotiate stairs reciprocally, with bannister assistance.   Sessions:  6      Goal 3:  Increase hip abduction strength > 42 psi to be able to independently transfer in/out of vehicle.    Sessions:  6      Goal 4:  Increase FOTO score form 49 to > 59, to be able to get out of low chair/vehicle.     03/23/18: Pt regressed from 49 to 43.    Sessions:  6   Progression:  regressing          Goal 5:  Patient will demonstrate independence in prescribed HEP with proper form, sets and reps for safe discharge to an independent program.   Sessions:  6   Progression:  met                         Gilmer Mor, PT

## 2018-03-23 NOTE — Telephone Encounter (Signed)
Patient needs a letter that says he is fit for work since last Thursday.     Since [employer] disability form ended on 03-19-2018 his work needs a Physicist, medical from his PCP to ok him      Please give pt a call once letter has been filled out: [phone]

## 2018-03-23 NOTE — Telephone Encounter (Signed)
Pt informed letter ready for pick up, he said she will come and pick up from office

## 2018-03-23 NOTE — Telephone Encounter (Signed)
Can you date please he wants to said he can go start work 10/24

## 2018-03-23 NOTE — Telephone Encounter (Signed)
Written       [name]

## 2018-03-23 NOTE — Telephone Encounter (Signed)
I did       [name] [name]

## 2018-03-23 NOTE — Telephone Encounter (Signed)
-----   Message from Delene Ruffini, MD sent at 03/23/2018  9:41 AM EDT -----  Labs are normal... guy with neuropathy, was looking for causes other than his longstanding not-well-controlled diabetes. He is following up PRN so can we call him just to let him know these were ok? Thanks!

## 2018-03-24 ENCOUNTER — Encounter (INDEPENDENT_AMBULATORY_CARE_PROVIDER_SITE_OTHER): Payer: Self-pay | Admitting: Student in an Organized Health Care Education/Training Program

## 2018-04-01 ENCOUNTER — Inpatient Hospital Stay: Payer: No Typology Code available for payment source

## 2018-04-01 ENCOUNTER — Ambulatory Visit: Payer: No Typology Code available for payment source | Attending: Podiatric Surgery

## 2018-04-01 DIAGNOSIS — Z872 Personal history of diseases of the skin and subcutaneous tissue: Secondary | ICD-10-CM | POA: Insufficient documentation

## 2018-04-01 DIAGNOSIS — Z09 Encounter for follow-up examination after completed treatment for conditions other than malignant neoplasm: Secondary | ICD-10-CM | POA: Insufficient documentation

## 2018-04-01 DIAGNOSIS — S91301D Unspecified open wound, right foot, subsequent encounter: Secondary | ICD-10-CM

## 2018-04-02 ENCOUNTER — Inpatient Hospital Stay
Payer: No Typology Code available for payment source | Attending: Student in an Organized Health Care Education/Training Program

## 2018-04-02 DIAGNOSIS — M6281 Muscle weakness (generalized): Secondary | ICD-10-CM | POA: Insufficient documentation

## 2018-04-02 NOTE — PT/OT Therapy Note (Signed)
Name: [name] Age: 57 y.o.   Date of Service: 04/02/2018  Referring Physician: Noel Christmas*   Date of Injury: 12/28/2017  Date Care Plan Established/Reviewed: 03/04/2018  Date Treatment Started: 03/04/2018  End of Certification Date: 05/02/2018  Sessions in Plan of Care: 6  Surgery Date: No data was found    Visit Count: 5   Diagnosis:   1. Muscle weakness            Subjective     Pain   Current pain rating: 3    Social Support/Occupation  Lives in: multiple level home  Lives with: spouse  Occupation: IT    Pt reports he doesn't feel he is making progress. He sees some change when walking to his car but it depends on the time of day. He will be starting a cholesterol injection soon that he will take 1x/2 weeks. Hes able to ride his bike for 5 minutes before the pain gets really severe, and then he switches to using his UE.       Precautions: cardiac  diabetes  Allergies: Statins and Lyrica [pregabalin]               BP: (!) 161/92 Heart Rate: 78    Treatment     Therapeutic Exercises   Justification: To improve flexibility, strength and ROM.   See flow sheet.     Bike warm up to gain subjective information to assist in developing todays tx session and FOTO.     Seated gastroc stretch 1x30'' each in short sitting due to cramping after exercise    Rest break between step ups     TC/VC for:  - glute med engagement during hip scaption  - to place entire foot on step during step ups   - lower slowly for eccentric quad control     Neuromuscular Re-Education   Justification: To improve motor control and proprioception.  In // with gait belt:   Tandem walking 2 laps   FT reaching cane in various directions   Standing on foam (1 foam under each LE ~ 1 min       PT is guarding the patient with gait belt, cuing to maintain fwd visual gaze           Exercise Flow Sheet    Exercise Specifics 03/09/18 03/16/18 03/23/18 04/02/18            Hip ER ROM Short sitting/ warm up  2x10 ea  AP > > Hip abd sitting   BTB above  knee  20x    BTB Hip ER/IR at ankle   15x ea  AP            Mini squats In // 3x5 with 1-2' break in b/w sets  AP > > Hip scaption standing  10x ea   AP          Seated calf raises    20x  Pressure on L>R  AP > > Seated   30x  Added DF raises  30x   AP             Bike Seat 15   4'   AP 5'  AP 5' AP            LAQ Short sitting   10x ea   AP LAQ with ball squeeze   20X   AP          Marching   Short sitting  20x   AP -          HS curls   Standing in // bars   20x   AP Step ups 8''   Step tapping only  10x ea   AP             Sit to stands With airex pad on chair    8x (1-2 min break in b/w)  AP >            Vectors HHA on right in //   L LE only  3x5'' for 3 sets  AP >                                            Home Exercise Program                 (Initials = supervised exercise by clinician)       ---      ---   Total Time   Timed Minutes  45 minutes   Total Time  45 minutes        Assessment   Due to patients high BP today, most exercises done in sitting to conserve energy. Pt tolerated tx well, with mild dizziness noted upon standing. Pt demo R hip drop with steps ups on the L. Taught pt to perform hip scaption exercises in standing for CKC and OKC glut med activation. Low tolerance for tandem walking, required total contact guard on gait belt and in // bars. Continued burning in LE and fatigue requires frequent rest periods. Requires reminders to place entire foot on step to prevent falls. Pt reports he still does a step too pattern at home for safety. Pt reported muscle contraction in his knees with step ups.       Justification: to improve LE proprioception and strength to reduce fall risk.   Plan   Continue per POC. Continue working on/progressing balance exercises with EC/head.  Continue with step ups due to patients apartment and demand for stairs on a daily basis.          Updated FOTO:  03/23/18 FOTO score 43 (IE 49)    Goals    Goal 1:  Increase TUG score from 22 sec to < 15 sec, to be able to walk >  7 minutes without fall/LOB.    Sessions:  6      Goal 2:  Increase Quad Strength > 45 psi, to be able to negotiate stairs reciprocally, with bannister assistance.   Sessions:  6      Goal 3:  Increase hip abduction strength > 42 psi to be able to independently transfer in/out of vehicle.    Sessions:  6      Goal 4:  Increase FOTO score form 49 to > 59, to be able to get out of low chair/vehicle.     03/23/18: Pt regressed from 49 to 43.    Sessions:  6   Progression:  regressing          Goal 5:  Patient will demonstrate independence in prescribed HEP with proper form, sets and reps for safe discharge to an independent program.   Sessions:  6   Progression:  met  Gilmer Mor, PT

## 2018-04-08 ENCOUNTER — Inpatient Hospital Stay
Payer: No Typology Code available for payment source | Attending: Student in an Organized Health Care Education/Training Program

## 2018-04-08 DIAGNOSIS — M6281 Muscle weakness (generalized): Secondary | ICD-10-CM | POA: Insufficient documentation

## 2018-04-08 NOTE — Progress Notes (Signed)
Name:[name] Age: 57 y.o.   Date of Service: 04/08/2018  Referring Physician: Noel Christmas*   Date of Injury: 12/28/2017  Date Care Plan Established/Reviewed: 04/08/2018  Date Treatment Started: 03/04/2018  End of Certification Date: 06/06/2018  Sessions in Plan of Care: 3  Surgery Date: No data was found      Visit Count: 6   Diagnosis:   1. Muscle weakness        Subjective     History of Present Illness   Functional Limitations (PLOF): Challenge with getting of low chair/vehicle (prior no difficulty)  Ambulate with walking shoe (prior no AD)  Heavy bannister assistance, with intermittent step to mechanics (prior reciprocal with bannister assistance)  Able to walk up to 3 minutes (prior > 10 minutes)      Outcome Measure   Tool Used/Details: FOTO  Score: 43  Predicted Functional Outcome: 49    Pain   Patient reported still challenge with balance, still requires use of SPC. Heavy UE reliance with stair negotiation, currently step to mechanics. Challenge with left leg fatigue and challenge with pushing, to ascend stairs.     Social Support/Occupation  Lives in: multiple level home  Lives with: spouse  Occupation: IT      Precautions: cardiac  diabetes  Allergies: Statins and Lyrica [pregabalin]    Past Medical History:   Diagnosis Date   . Dehydration    . Diabetes mellitus    . Dupre's syndrome    . Hypertension    . Migraine    . Neuropathy    . Pancreatitis    . Sleep apnea    . Type 1 diabetes mellitus                       Strength     04/08/18  Left IE  Left Strength  Hip  PSI IE  Right 04/08/18  Right   63.4 59.4 Hip Flexion 65.6 68.5     Hip Extension     43.2 36.3 Hip Abduction 43.1 40.3     Hip Adduction     27.6 30 Hip IR 32.1 35.6   28.8 30.7 Hip ER 30.1 30.8   38.8 46.1 Quadriceps 41.8 47.7   32.9 33.1 Hamstrings 43.7 33.9   (blank fields were intentionally left blank)          ---      ---   Total Time   Timed Minutes  40 minutes   Total Time  40 minutes        Assessment    Focus on weight acceptance to progress with SLS stabilization and strength, for stair negotiation. Increase fatigue to left noted, after sit/stand    Justification: to improve LE proprioception and strength to reduce fall risk.     Prior Level of Function: Challenge with getting of low chair/vehicle (prior no difficulty)  Ambulate with walking shoe (prior no AD)  Heavy bannister assistance, with intermittent step to mechanics (prior reciprocal with bannister assistance)  Able to walk up to 3 minutes (prior > 10 minutes)    Plan   To continue for 1x/wk for 3 additional sessions, to focus on SLS balance and stabilization, for stair negotiation.      Goals    Goal 1:  Increase TUG score from 22 sec to < 15 sec, to be able to walk > 7 minutes without fall/LOB.    Sessions:  9      Goal  2:  Increase Quad Strength > 45 psi, to be able to negotiate stairs reciprocally, with bannister assistance.    Reassessed 04/08/18   Sessions:  9   Progression:  not met       Goal 3:  Increase hip abduction strength > 42 psi to be able to independently transfer in/out of vehicle.     Reassessed 04/08/18   Sessions:  9      Goal 4:  Increase FOTO score form 49 to > 59, to be able to get out of low chair/vehicle.     03/23/18: Pt regressed from 49 to 43.    Sessions:  9   Progression:  regressing          Goal 5:  Patient will demonstrate independence in prescribed HEP with proper form, sets and reps for safe discharge to an independent program.   Sessions:  6   Progression:  met                         Harrell Gave, PT

## 2018-04-08 NOTE — PT/OT Therapy Note (Signed)
Name: [name] Age: 57 y.o.   Date of Service: 04/08/2018  Referring Physician: Noel Christmas*   Date of Injury: 12/28/2017  Date Care Plan Established/Reviewed: 04/08/2018  Date Treatment Started: 03/04/2018  End of Certification Date: 06/06/2018  Sessions in Plan of Care: 3  Surgery Date: No data was found    Visit Count: 6   Diagnosis:   1. Muscle weakness            Subjective     History of Present Illness   Functional Limitations (PLOF): Challenge with getting of low chair/vehicle (prior no difficulty)  Ambulate with walking shoe (prior no AD)  Heavy bannister assistance, with intermittent step to mechanics (prior reciprocal with bannister assistance)  Able to walk up to 3 minutes (prior > 10 minutes)      Pain   Current pain rating: 3    Social Support/Occupation  Lives in: multiple level home  Lives with: spouse  Occupation: IT    Patient reported still challenge with balance, still requires use of SPC. Heavy UE reliance with stair negotiation, currently step to mechanics. Challenge with left leg fatigue and challenge with pushing, to ascend stairs.       Precautions: cardiac  diabetes  Allergies: Statins and Lyrica [pregabalin]                         Strength     04/08/18  Left IE  Left Strength  Hip  PSI IE  Right 04/08/18  Right   63.4 59.4 Hip Flexion 65.6 68.5     Hip Extension     43.2 36.3 Hip Abduction 43.1 40.3     Hip Adduction     27.6 30 Hip IR 32.1 35.6   28.8 30.7 Hip ER 30.1 30.8   38.8 46.1 Quadriceps 41.8 47.7   32.9 33.1 Hamstrings 43.7 33.9   (blank fields were intentionally left blank)    Treatment     Therapeutic Exercises   Justification: To improve flexibility, strength and ROM.   Recumb for 4' for subjective history gathering and active warm-up.     Strength Reassessment     Seated:    LAQ: 15x Bilat  Hip ER: 2x10 Bilat        Neuromuscular Re-Education   Justification: To improve motor control and proprioception.  Supine:    Bilat weight bearing in  hooklying: 5" hold, 2'  Alt Weight shift and contralateral ankle DF: 1'x3  Bridge with focus on anterior femur push: 3x10    Seated with greater trochanter above knee, promoting approximately 80 deg hip flexion:    Staggered stance sit/stand: 15x Bilat          Exercise Flow Sheet    Exercise Specifics 03/09/18 03/16/18 03/23/18 04/02/18            Hip ER ROM Short sitting/ warm up  2x10 ea  AP > > Hip abd sitting   BTB above knee  20x    BTB Hip ER/IR at ankle   15x ea  AP            Mini squats In // 3x5 with 1-2' break in b/w sets  AP > > Hip scaption standing  10x ea   AP          Seated calf raises    20x  Pressure on L>R  AP > > Seated   30x  Added DF raises  30x  AP             Bike Seat 15   4'   AP 5'  AP 5' AP            LAQ Short sitting   10x ea   AP LAQ with ball squeeze   20X   AP          Marching   Short sitting   20x   AP -          HS curls   Standing in // bars   20x   AP Step ups 8''   Step tapping only  10x ea   AP             Sit to stands With airex pad on chair    8x (1-2 min break in b/w)  AP >            Vectors HHA on right in //   L LE only  3x5'' for 3 sets  AP >                                            Home Exercise Program                 (Initials = supervised exercise by clinician)       ---      ---   Total Time   Timed Minutes  40 minutes   Total Time  40 minutes        Assessment   Focus on weight acceptance to progress with SLS stabilization and strength, for stair negotiation. Increase fatigue to left noted, after sit/stand      Justification: to improve LE proprioception and strength to reduce fall risk.   Prior Level of Function: Challenge with getting of low chair/vehicle (prior no difficulty)  Ambulate with walking shoe (prior no AD)  Heavy bannister assistance, with intermittent step to mechanics (prior reciprocal with bannister assistance)  Able to walk up to 3 minutes (prior > 10 minutes)    Plan   To continue for 1x/wk for 3 additional sessions, to focus on SLS balance  and stabilization, for stair negotiation.          Updated FOTO:  03/23/18 FOTO score 43 (IE 49)    Goals    Goal 1:  Increase TUG score from 22 sec to < 15 sec, to be able to walk > 7 minutes without fall/LOB.    Sessions:  9      Goal 2:  Increase Quad Strength > 45 psi, to be able to negotiate stairs reciprocally, with bannister assistance.    Reassessed 04/08/18   Sessions:  9   Progression:  not met       Goal 3:  Increase hip abduction strength > 42 psi to be able to independently transfer in/out of vehicle.     Reassessed 04/08/18   Sessions:  9      Goal 4:  Increase FOTO score form 49 to > 59, to be able to get out of low chair/vehicle.     03/23/18: Pt regressed from 49 to 43.    Sessions:  9   Progression:  regressing          Goal 5:  Patient will demonstrate independence in prescribed HEP with proper form, sets and reps  for safe discharge to an independent program.   Sessions:  6   Progression:  met                         Harrell Gave, PT

## 2018-04-14 ENCOUNTER — Ambulatory Visit: Payer: No Typology Code available for payment source | Admitting: Internal Medicine

## 2018-04-15 ENCOUNTER — Inpatient Hospital Stay
Payer: No Typology Code available for payment source | Attending: Student in an Organized Health Care Education/Training Program

## 2018-04-15 DIAGNOSIS — M6281 Muscle weakness (generalized): Secondary | ICD-10-CM | POA: Insufficient documentation

## 2018-04-15 NOTE — PT/OT Therapy Note (Signed)
Name: [name] Age: 57 y.o.   Date of Service: 04/15/2018  Referring Physician: Noel Christmas*   Date of Injury: 12/28/2017  Date Care Plan Established/Reviewed: 04/08/2018  Date Treatment Started: 03/04/2018  End of Certification Date: 06/06/2018  Sessions in Plan of Care: 3  Surgery Date: No data was found    Visit Count: 7   Diagnosis:   1. Muscle weakness            Subjective     History of Present Illness   Functional Limitations (PLOF): Challenge with getting of low chair/vehicle (prior no difficulty)  Ambulate with walking shoe (prior no AD)  Heavy bannister assistance, with intermittent step to mechanics (prior reciprocal with bannister assistance)  Able to walk up to 3 minutes (prior > 10 minutes)      Pain   Current pain rating: 3    Social Support/Occupation  Lives in: multiple level home  Lives with: spouse  Occupation: IT    Patient reported increase right LE discomfort, from right posterior hip to ankle. Challenge with right weight bearing.       Precautions: cardiac  diabetes  Allergies: Statins and Lyrica [pregabalin]                         Strength     04/08/18  Left IE  Left Strength  Hip  PSI IE  Right 04/08/18  Right   63.4 59.4 Hip Flexion 65.6 68.5     Hip Extension     43.2 36.3 Hip Abduction 43.1 40.3     Hip Adduction     27.6 30 Hip IR 32.1 35.6   28.8 30.7 Hip ER 30.1 30.8   38.8 46.1 Quadriceps 41.8 47.7   32.9 33.1 Hamstrings 43.7 33.9   (blank fields were intentionally left blank)    Standing ASIS/PSIS Alignment: 04/15/18  Right innominate anterior rotation deviation.     Treatment     Therapeutic Exercises   Justification: To improve flexibility, strength and ROM.   Recumb for 4' for subjective history gathering and active warm-up.     See Flow Sheet        Neuromuscular Re-Education   Justification: To improve motor control and proprioception.  See Flow Sheet    Manual Therapy   Justification: To decrease soft tissue restrictions, to normalize alignment.    Semi-Reclined:  DTM/MFR/TPR: Right QL and L1-5 Iliocostalis  Right LLD          Exercise Flow Sheet    Exercise Specifics 03/09/18 03/16/18 03/23/18 04/02/18 04/15/18           Hip ER ROM Short sitting/ warm up  2x10 ea  AP > > Hip abd sitting   BTB above knee  20x    BTB Hip ER/IR at ankle   15x ea  AP Seated Sartorius  2x10 Bilat  QB             Mini squats In // 3x5 with 1-2' break in b/w sets  AP > > Hip scaption standing  10x ea   AP Stand at [employer] in staggered stance, wt shift to front with, extended forward knee and contral hip flex  2x10  Bilat  QB         Seated calf raises    20x  Pressure on L>R  AP > > Seated   30x  Added DF raises  30x   AP  Alt Stool Scoot  2x Hall  QB           Bike Seat 15   4'   AP 5'  AP 5' AP Side Step  2x Hall  QB           LAQ Short sitting   10x ea   AP LAQ with ball squeeze   20X   AP Semi-Reclined Wt bear and contralat hip flex  2x10  Alt  QB         Marching   Short sitting   20x   AP - Right SLR Flex in semi-reclined: 10x  QB         HS curls   Standing in // bars   20x   AP Step ups 8''   Step tapping only  10x ea   AP SLS with 1 finger at [employer]  5" hold  15x  Bilat  QB            Sit to stands With airex pad on chair    8x (1-2 min break in b/w)  AP >            Vectors HHA on right in //   L LE only  3x5'' for 3 sets  AP >                                            Home Exercise Program                 (Initials = supervised exercise by clinician)       ---      ---   Total Time   Timed Minutes  40 minutes   Total Time  40 minutes        Assessment   Right innominate rotation deviation corrected. Increase burning sensation to bilateral feet, with left LTR attempt, therefore discontinued.       Justification: to improve LE proprioception and strength to reduce fall risk.   Prior Level of Function: Challenge with getting of low chair/vehicle (prior no difficulty)  Ambulate with walking shoe (prior no AD)  Heavy bannister assistance, with intermittent step to mechanics  (prior reciprocal with bannister assistance)  Able to walk up to 3 minutes (prior > 10 minutes)    Plan   Reassess alignment and continue to progress with SLS exercises.          Updated FOTO:  03/23/18 FOTO score 43 (IE 49)    Goals    Goal 1:  Increase TUG score from 22 sec to < 15 sec, to be able to walk > 7 minutes without fall/LOB.    Sessions:  9      Goal 2:  Increase Quad Strength > 45 psi, to be able to negotiate stairs reciprocally, with bannister assistance.    Reassessed 04/08/18   Sessions:  9   Progression:  not met       Goal 3:  Increase hip abduction strength > 42 psi to be able to independently transfer in/out of vehicle.     Reassessed 04/08/18   Sessions:  9      Goal 4:  Increase FOTO score form 49 to > 59, to be able to get out of low chair/vehicle.     03/23/18: Pt regressed from 49 to 43.    Sessions:  9  Progression:  regressing          Goal 5:  Patient will demonstrate independence in prescribed HEP with proper form, sets and reps for safe discharge to an independent program.   Sessions:  6   Progression:  met                         Harrell Gave, PT

## 2018-04-20 ENCOUNTER — Inpatient Hospital Stay
Payer: No Typology Code available for payment source | Attending: Student in an Organized Health Care Education/Training Program

## 2018-04-20 ENCOUNTER — Encounter (INDEPENDENT_AMBULATORY_CARE_PROVIDER_SITE_OTHER): Payer: Self-pay | Admitting: Student in an Organized Health Care Education/Training Program

## 2018-04-20 ENCOUNTER — Ambulatory Visit (INDEPENDENT_AMBULATORY_CARE_PROVIDER_SITE_OTHER)
Payer: No Typology Code available for payment source | Admitting: Student in an Organized Health Care Education/Training Program

## 2018-04-20 VITALS — BP 117/73 | HR 75 | Temp 97.7°F | Resp 13 | Wt 202.2 lb

## 2018-04-20 DIAGNOSIS — M6281 Muscle weakness (generalized): Secondary | ICD-10-CM | POA: Insufficient documentation

## 2018-04-20 DIAGNOSIS — R278 Other lack of coordination: Secondary | ICD-10-CM

## 2018-04-20 DIAGNOSIS — E1142 Type 2 diabetes mellitus with diabetic polyneuropathy: Secondary | ICD-10-CM

## 2018-04-20 NOTE — Progress Notes (Signed)
Have you seen any specialists/other providers since your last visit with [place]?    No    Arm preference verified?   Yes    The patient is due for eye exam, shingles vaccine and HEP C, [facility]

## 2018-04-20 NOTE — PT/OT Therapy Note (Signed)
Name: [name] Age: 57 y.o.   Date of Service: 04/20/2018  Referring Physician: Noel Christmas*   Date of Injury: 12/28/2017  Date Care Plan Established/Reviewed: 04/08/2018  Date Treatment Started: 03/04/2018  End of Certification Date: 06/06/2018  Sessions in Plan of Care: 3  Surgery Date: No data was found    Visit Count: 8   Diagnosis:   1. Muscle weakness            Subjective     History of Present Illness   Functional Limitations (PLOF): Challenge with getting of low chair/vehicle (prior no difficulty)  Ambulate with walking shoe (prior no AD)  Heavy bannister assistance, with intermittent step to mechanics (prior reciprocal with bannister assistance)  Able to walk up to 3 minutes (prior > 10 minutes)      Pain   Current pain rating: 3    Social Support/Occupation  Lives in: multiple level home  Lives with: spouse  Occupation: IT    Patient reports feeling both legs same today, cramping is increased lt>Rt so he schedule this afternoon with doc.       Precautions: cardiac  diabetes  Allergies: Statins and Lyrica [pregabalin]                         Strength     04/08/18  Left IE  Left Strength  Hip  PSI IE  Right 04/08/18  Right   63.4 59.4 Hip Flexion 65.6 68.5     Hip Extension     43.2 36.3 Hip Abduction 43.1 40.3     Hip Adduction     27.6 30 Hip IR 32.1 35.6   28.8 30.7 Hip ER 30.1 30.8   38.8 46.1 Quadriceps 41.8 47.7   32.9 33.1 Hamstrings 43.7 33.9   (blank fields were intentionally left blank)    Standing ASIS/PSIS Alignment: 04/15/18  Right innominate anterior rotation deviation.     Treatment     Therapeutic Exercises   Justification: To improve flexibility, strength and ROM.     See Flow Sheet    TC and VC to correct form of ex        Neuromuscular Re-Education   Justification: To improve motor control and proprioception.  See Flow Sheet    Balance ex as per flow sheet    Manual Therapy   Justification: To decrease soft tissue restrictions, to normalize alignment.   None            Exercise Flow Sheet    Exercise Specifics 03/09/18 03/16/18 03/23/18 04/02/18 04/15/18 04/20/18          Hip ER ROM Short sitting/ warm up  2x10 ea  AP > > Hip abd sitting   BTB above knee  20x    BTB Hip ER/IR at ankle   15x ea  AP Seated Sartorius  2x10 Bilat  QB   SL vectors with UE support  3"/5/3  B  VM          Mini squats In // 3x5 with 1-2' break in b/w sets  AP > > Hip scaption standing  10x ea   AP Stand at [place] in staggered stance, wt shift to front with, extended forward knee and contral hip flex  2x10  Bilat  QB Hip ER in semi recline RTB  x20  VM        Seated calf raises    20x  Pressure on L>R  AP > >  Seated   30x  Added DF raises  30x   AP  Alt Stool Scoot  2x Hall  QB LAQ  5" hold  2x10  VM            Bike Seat 15   4'   AP 5'  AP 5' AP Side Step  2x Hall  QB SS x1 hall  VM ( stopped because of pt's weakness)          LAQ Short sitting   10x ea   AP LAQ with ball squeeze   20X   AP Semi-Reclined Wt bear and contralat hip flex  2x10  Alt  QB Semi-Reclined Wt bear and contralat hip flex  5" hold  2'  VM        Marching   Short sitting   20x   AP - Right SLR Flex in semi-reclined: 10x  QB B SLR in semi recline  x10  VM        HS curls   Standing in // bars   20x   AP Step ups 8''   Step tapping only  10x ea   AP SLS with 1 finger at [employer]  5" hold  15x  Bilat  QB SLS with 1 finger at [employer]  20"x5  Bilat  VM           Sit to stands With airex pad on chair    8x (1-2 min break in b/w)  AP >  Hip IR in sitting   BTB  x20  VM            Vectors HHA on right in //   L LE only  3x5'' for 3 sets  AP >  Sit to stand on hi-low table at   65 cms  2x10  VM                                          Home Exercise Program                 (Initials = supervised exercise by clinician)       ---      ---   Total Time   Timed Minutes  38 minutes   Total Time  38 minutes        Assessment   Pt showing good alignment today. Pt was challenged with balance and strength ex as pt tolerate. Stopped SS because of  pt's not feels leg while he was moving and discomfort with that.   Justification: to improve LE proprioception and strength to reduce fall risk.   Prior Level of Function: Challenge with getting of low chair/vehicle (prior no difficulty)  Ambulate with walking shoe (prior no AD)  Heavy bannister assistance, with intermittent step to mechanics (prior reciprocal with bannister assistance)  Able to walk up to 3 minutes (prior > 10 minutes)    Plan   progress with SLS exercises.          Updated FOTO:  03/23/18 FOTO score 43 (IE 49)    Goals    Goal 1:  Increase TUG score from 22 sec to < 15 sec, to be able to walk > 7 minutes without fall/LOB.    Sessions:  9      Goal 2:  Increase Quad Strength > 45 psi, to be able to negotiate  stairs reciprocally, with bannister assistance.    Reassessed 04/08/18   Sessions:  9   Progression:  not met       Goal 3:  Increase hip abduction strength > 42 psi to be able to independently transfer in/out of vehicle.     Reassessed 04/08/18   Sessions:  9      Goal 4:  Increase FOTO score form 49 to > 59, to be able to get out of low chair/vehicle.     03/23/18: Pt regressed from 49 to 43.    Sessions:  9   Progression:  regressing          Goal 5:  Patient will demonstrate independence in prescribed HEP with proper form, sets and reps for safe discharge to an independent program.   Sessions:  6   Progression:  met                         Lucia Gaskins, PT

## 2018-04-22 NOTE — Progress Notes (Signed)
Chief Complaint   Patient presents with   . Leg Pain     L leg pain getting worse    . Peripheral Neuropathy       57 yo with poorly controlled IDDM/HTN/HLD/CAD /neuropathy, he is essentially here to f/u on ? Neuropathy , he is seeing a neurologist and no treatment was offered , Pt states that his metarsal pain is getting worse and is unable to walk or sleep at night , he tried neurontin (OD'ed on it in the past ), lyrica, TCA, cymbalta w/o help , he is interested in getting nerve block, recent labs from the neurologist was oK , pT would like to get more therapeutic options   - The patient denies orthostasis.  - The patient denies chest pain.  - The patient denies dyspnea.  - The patient denies edema.        Vitals:    04/20/18 1513   BP: 117/73   Pulse: 75   Resp: 13   Temp: 97.7 F (36.5 C)   SpO2: 98%   Weight: 91.7 kg (202 lb 3.2 oz)       Patient Active Problem List   Diagnosis   . Coronary atherosclerosis   . Type 2 diabetes mellitus with diabetic polyneuropathy, with long-term current use of insulin   . Statin-induced myositis   . Diabetic peripheral neuropathy   . Other hyperlipidemia   . History of pancreatitis   . Cellulitis   . Open wound of right foot   . Muscle weakness       Past Medical History:   Diagnosis Date   . Dehydration    . Diabetes mellitus    . Dupre's syndrome    . Hypertension    . Migraine    . Neuropathy    . Pancreatitis    . Sleep apnea    . Type 1 diabetes mellitus        Past Surgical History:   Procedure Laterality Date   . BONE GRAFT     . BYPASS, CAROTID-SUBCLAVIAN     . CARDIAC CATHETERIZATION     . EYE SURGERY     . WRIST SURGERY         Social History     Socioeconomic History   . Marital status: Married     Spouse name: Not on file   . Number of children: Not on file   . Years of education: Not on file   . Highest education level: Not on file   Occupational History   . Not on file   Social Needs   . Financial resource strain: Not on file   . Food insecurity:     Worry: Not  on file     Inability: Not on file   . Transportation needs:     Medical: Not on file     Non-medical: Not on file   Tobacco Use   . Smoking status: Former Smoker     Packs/day: 0.00   . Smokeless tobacco: Never Used   . Tobacco comment: quit 7 years ago    Substance and Sexual Activity   . Alcohol use: Yes     Alcohol/week: 1.0 standard drinks     Types: 1 Cans of beer per week     Comment: 1 pack in 6 months.   . Drug use: No   . Sexual activity: Never   Lifestyle   . Physical activity:     Days per week:  Not on file     Minutes per session: Not on file   . Stress: Not on file   Relationships   . Social connections:     Talks on phone: Not on file     Gets together: Not on file     Attends religious service: Not on file     Active member of club or organization: Not on file     Attends meetings of clubs or organizations: Not on file     Relationship status: Not on file   . Intimate partner violence:     Fear of current or ex partner: Not on file     Emotionally abused: Not on file     Physically abused: Not on file     Forced sexual activity: Not on file   Other Topics Concern   . Not on file   Social History Narrative   . Not on file       Family History   Problem Relation Age of Onset   . Diabetes Mother    . Heart disease Father        Allergies   Allergen Reactions   . Statins      Other reaction(s): Arthralgia (Joint Pain)   . Lyrica [Pregabalin]      Nerve pain, out of body experiences       Current Outpatient Medications   Medication Sig Dispense Refill   . aspirin 81 MG chewable tablet Chew 81 mg by mouth daily     . carvedilol (COREG) 6.25 MG tablet Take 6.25 mg by mouth every 12 (twelve) hours     . Evolocumab 140 MG/ML Solution Auto-injector Inject 1 mL into the skin every 14 (fourteen) days 12 pen 3   . ezetimibe (ZETIA) 10 MG tablet Take 10 mg by mouth daily        . furosemide (LASIX) 20 MG tablet Take 20 mg by mouth        . glucose blood (ONETOUCH VERIO) test strip Check blood sugars 4 xday 120  each 5   . insulin detemir (LEVEMIR FLEXTOUCH) 100 UNIT/ML injection pen Levemir FlexTouch U-100 Insulin 100 unit/mL (3 mL) subcutaneous pen : 50  Units SC bid 30 pen 1   . insulin lispro (HUMALOG KWIKPEN) 100 UNIT/ML injection pen Humalog KwikPen (U-100) Insulin 100 unit/mL subcutaneous per SSI 1-20 units premeals 15 pen 1   . Insulin Pen Needle 31G X 8 MM Misc Use with insulin pen 1000 each 3   . ondansetron (ZOFRAN-ODT) 4 MG disintegrating tablet Take 1 tablet (4 mg total) by mouth every 6 (six) hours as needed for Nausea 12 tablet 0   . sacubitril-valsartan (ENTRESTO) 24-26 MG Tab per tablet 2 (two) times daily        . metFORMIN (GLUCOPHAGE-XR) 750 MG 24 hr tablet Take 1 tablet (750 mg total) by mouth 2 (two) times daily 60 tablet 4     No current facility-administered medications for this visit.        Constitutional: + fatigue, no fever, chills   Eyes: negative for vision problems   Ears, nose, mouth, throat, and face: negative for nose bleeds, nasal congestion   Respiratory: negative for SOB, cough , wheezing   Cardiovascular: negative for CP, dizziness, palpitations, swelling , orthopnea   Gastrointestinal: negative for diarrhea , nausea, vomiting, constipation   Genitourinary:negative for dysuria, frequency and hematuria   Integument/breast: negative for nodule   Hematologic/lymphatic: negative for enlarged LAD,  Musculoskeletal diffuse  myalgia, joint pain and swelling   Neurological: poor balance, ataxia and numbness   Behavioral/Psych: negative for anxiety, depression    Other ROS were reviewed and were neg         Vitals:    04/20/18 1513   BP: 117/73   Pulse: 75   Resp: 13   Temp: 97.7 F (36.5 C)   SpO2: 98%       CONSTITUTIONAL Patient is afebrile, Vital signs reviewed. Hypertensive.   HEAD Atraumatic, Normocephallc.   EYES Eyes are normal to inspection, PERRL, No discharge from eyes,   ENT Ears normal to inspection, Nose examination normal, Posterior pharynx normal.   NECK Normal ROM, No jugular  venous distention, No meningeal signs.   RESPIRATORY CHEST Chest is nontender, Breath sounds normal.   CARDIOVASCULAR RRR, Heart sounds normal, Normal S1 S2.   ABDOMEN Abdomen is nontender, No pulsatile masses, No other masses,   Bowel sounds normal, No distension, No peritoneal signs.   BACK There is no CVA Tenderness, There is no tenderness to palpation.   UPPER EXTREMITY Inspection normal, No cyanosis.   LOWER EXTREMITY Inspection normal, No cyanosis.   NEURO Pt doesn't feel his feet up to midthigh B/l and hands b/l   Sensory ataxia     A/P    1. Diabetic peripheral neuropathy  Ambulatory referral to Neurology   2. Sensory ataxia  Ambulatory referral to Neurology     Pt is not interested in pain management or trying TCA, SSRIS/NSRI, Lyrica     Pt was [facility] to [facility] to get their opinion about nerve blocks per expertise     Control DM, B complex     Frandy Basnett Khatib Neziah Braley

## 2018-04-28 ENCOUNTER — Inpatient Hospital Stay
Payer: No Typology Code available for payment source | Attending: Student in an Organized Health Care Education/Training Program

## 2018-04-28 ENCOUNTER — Ambulatory Visit (INDEPENDENT_AMBULATORY_CARE_PROVIDER_SITE_OTHER): Payer: No Typology Code available for payment source | Admitting: Internal Medicine

## 2018-04-28 ENCOUNTER — Encounter: Payer: Self-pay | Admitting: Internal Medicine

## 2018-04-28 VITALS — BP 149/84 | HR 84 | Wt 204.4 lb

## 2018-04-28 DIAGNOSIS — I10 Essential (primary) hypertension: Secondary | ICD-10-CM | POA: Insufficient documentation

## 2018-04-28 DIAGNOSIS — M6281 Muscle weakness (generalized): Secondary | ICD-10-CM | POA: Insufficient documentation

## 2018-04-28 DIAGNOSIS — E1065 Type 1 diabetes mellitus with hyperglycemia: Secondary | ICD-10-CM

## 2018-04-28 MED ORDER — INSULIN DEGLUDEC 200 UNIT/ML SC SOPN
100.00 [IU] | PEN_INJECTOR | Freq: Every day | SUBCUTANEOUS | 5 refills | Status: DC
Start: 2018-04-28 — End: 2018-06-01

## 2018-04-28 NOTE — PT/OT Therapy Note (Signed)
Name: [name] Age: 57 y.o.   Date of Service: 04/28/2018  Referring Physician: Noel Christmas*   Date of Injury: 12/28/2017  Date Care Plan Established/Reviewed: 04/28/2018  Date Treatment Started: 03/04/2018  End of Certification Date: 06/26/2018  Sessions in Plan of Care: 3  Surgery Date: No data was found    Visit Count: 9   Diagnosis:   1. Muscle weakness            Subjective     History of Present Illness   Functional Limitations (PLOF): Able to walk up to 3 minutes (prior > 10 minutes)      Pain   Current pain rating: 3    Social Support/Occupation  Lives in: multiple level home  Lives with: spouse  Occupation: IT    Patient reported dragging sensation to right, with decrease right ankle DF. Challenge walking > 2 minutes. Able to transfer sit/stand in standard chair. Patient approved for Lasix surgery. Tightness sensation from bilateral thigh to feet.       Precautions: cardiac  diabetes  Allergies: Statins and Lyrica [pregabalin]                         Strength     04/28/18  Left 04/08/18  Left IE  Left Strength  Hip  PSI IE  Right 04/08/18  Right 04/28/18  Right    63.4 59.4 Hip Flexion 65.6 68.5       Hip Extension       43.2 36.3 Hip Abduction 43.1 40.3       Hip Adduction       27.6 30 Hip IR 32.1 35.6     28.8 30.7 Hip ER 30.1 30.8    39 38.8 46.1 Quadriceps 41.8 47.7 31.5   48.7 32.9 33.1 Hamstrings 43.7 33.9 54.7   24   Ankle DF   22.5   (blank fields were intentionally left blank)    Standing ASIS/PSIS Alignment: 04/15/18  Right innominate anterior rotation deviation.     TUG: 04/28/18  22 sec to 12 sec    Treatment     Therapeutic Exercises   Justification: To improve flexibility, strength and ROM.   See Flow Sheet    Strength Reassessment    TC and VC to correct form of ex        Neuromuscular Re-Education   Justification: To improve motor control and proprioception.  See Flow Sheet    TUG reassessment    Balance ex as per flow sheet    Manual Therapy   Justification: To  decrease soft tissue restrictions, to normalize alignment.   None           Exercise Flow Sheet    Exercise Specifics 03/09/18 03/16/18 03/23/18 04/02/18 04/15/18 04/20/18 04/28/18         Hip ER ROM Short sitting/ warm up  2x10 ea  AP > > Hip abd sitting   BTB above knee  20x    BTB Hip ER/IR at ankle   15x ea  AP Seated Sartorius  2x10 Bilat  QB   SL vectors with UE support  3"/5/3  B  VM Staggered Stance sit to stand at hi-lo table with greater troc above knee  2x10  QB         Mini squats In // 3x5 with 1-2' break in b/w sets  AP > > Hip scaption standing  10x ea   AP Stand at  island in staggered stance, wt shift to front with, extended forward knee and contral hip flex  2x10  Bilat  QB Hip ER in semi recline RTB  x20  VM longsit Ankle DF   RTB  2x10  QB       Seated calf raises    20x  Pressure on L>R  AP > > Seated   30x  Added DF raises  30x   AP  Alt Stool Scoot  2x Hall  QB LAQ  5" hold  2x10  VM   Seated ABC/abc  1x ea  QB         Bike Seat 15   4'   AP 5'  AP 5' AP Side Step  2x Hall  QB SS x1 hall  VM ( stopped because of pt's weakness) Seated LAQ with Ankle DF  2x10  QB         LAQ Short sitting   10x ea   AP LAQ with ball squeeze   20X   AP Semi-Reclined Wt bear and contralat hip flex  2x10  Alt  QB Semi-Reclined Wt bear and contralat hip flex  5" hold  2'  VM Stand at [employer] in runner stance SL to front leg and hip flex contral  2x15  Bilat  QB       Marching   Short sitting   20x   AP - Right SLR Flex in semi-reclined: 10x  QB B SLR in semi recline  x10  VM        HS curls   Standing in // bars   20x   AP Step ups 8''   Step tapping only  10x ea   AP SLS with 1 finger at [employer]  5" hold  15x  Bilat  QB SLS with 1 finger at island  20"x5  Bilat  VM 30"  Bilat  QB          Sit to stands With airex pad on chair    8x (1-2 min break in b/w)  AP >  Hip IR in sitting   BTB  x20  VM   BTB  2x10  QB         Vectors HHA on right in //   L LE only  3x5'' for 3 sets  AP >  Sit to stand on hi-low table at   65  cms  2x10  VM -                                         Home Exercise Program                 (Initials = supervised exercise by clinician)       ---      ---   Total Time   Timed Minutes  40 minutes   Total Time  40 minutes        Assessment   Patient has met TUG score, but not function. Regression noted with right quads. Decrease ankle DF strength.     Justification: to improve LE proprioception and strength to reduce fall risk.   Prior Level of Function: Able to walk up to 3 minutes (prior > 10 minutes)    Plan   To continue for 1x/wk for 3 additional sessions, to focus on quad and ankle DF strength for ambulation.  Updated FOTO:  03/23/18 FOTO score 43 (IE 49)    Goals    Goal 1:  Increase TUG score from 22 sec to < 15 sec, to be able to walk > 7 minutes without fall/LOB.     Met TUG, but not function: 04/28/18   Sessions:  12   Progression:  progressing      Goal 2:  Increase Quad Strength > 45 psi, to be able to negotiate stairs reciprocally, with bannister assistance.    Reassessed 04/08/18   Sessions:  12   Progression:  regressing       Goal 3:  Increase hip abduction strength > 42 psi to be able to independently transfer in/out of vehicle.     Reassessed 04/08/18   Sessions:  9   Progression:  met      Goal 4:  Increase FOTO score form 49 to > 59, to be able to get out of low chair/vehicle.     Met function   Sessions:  9   Progression:  met          Goal 5:  Patient will demonstrate independence in prescribed HEP with proper form, sets and reps for safe discharge to an independent program.   Sessions:  12   Progression:  progressing                         Harrell Gave, PT

## 2018-04-28 NOTE — Progress Notes (Signed)
[name] is a 57 y.o. male who presents today for a f/u visit for type 1 diabetes.  PCP: Bouhouch, Jacqualyn Posey, MD  Patient is referred by: PCP      ##DM:  The patient's diagnosis of diabetes mellitus was made when he was 57yrs old, was initially told he was type 1 diabetic, but later was told he was probably type 2 diabetic 6 years back, but has always been managed on insulin.  He was diagnosed at age 55yrs, when he had polyuria, polydipsia, had BG >1000, was in the hospital. Never had DKA but has been having urine ketones on and off over the last 3-4 years per pt.  He states he has never been off of insulin for more than a day since his diagnosis. Family h/o DM2 in his mother.  C peptide was 1.3 in 09/19.    Patient is currently on:   Levemir 50u bid  Humalog based on portions of carbs and SS (1 unit for every 15 above 120). (1:5 carb ratio)    Medications not tolerated in past or contraindications: Metformin    Medication dosage review with Kashawn suggested compliance most of the time.    Pt had h/o pancreatitis 2 yrs back.   Did not tolerate Metformin XR due to diarrhea.    Pt did not want any sensors/ insulin pumps.    No severe hypoglycemia occurred    HE had Dexcom for a week in 10/19, showed Global hyperglycemia and postprandial hyperglycemia        Diet:  Semicompliant with diet, eats 3 meals/d  Prior formal diabetes education/ nutritional counseling has been performed, he thinks he knows very well and does not need to see one.    Exercise:  Limited from neuropathy      Diabetic complications:   Cardiovascular: CAD s/p CABG 2018, PCI 2013  Neuropathy: Yes. Last foot exam was 10/19 done here , following with wound care for rt foot ulcer and has healed well.  Retinopathy: Yes, h/o laser. Last eye exam was 12/18.   Renal: Yes, h/o proteinuria per pt.  Yes ACE-I/ARB  No Statin, did not tolerate due to jt pains, on Zetia, PCSK9i  BP: not at goal    HLP:  On Zetia, took first dose of Evolocumab last  week, prescribed by his cardiologist.    Past Medical History:   Diagnosis Date   . Dehydration    . Diabetes mellitus    . Dupre's syndrome    . Hypertension    . Migraine    . Neuropathy    . Pancreatitis    . Sleep apnea    . Type 1 diabetes mellitus        Past Surgical History:   Procedure Laterality Date   . BONE GRAFT     . BYPASS, CAROTID-SUBCLAVIAN     . CARDIAC CATHETERIZATION     . EYE SURGERY     . WRIST SURGERY       Family History   Problem Relation Age of Onset   . Diabetes Mother    . Heart disease Father        Allergies   Allergen Reactions   . Statins      Other reaction(s): Arthralgia (Joint Pain)   . Lyrica [Pregabalin]      Nerve pain, out of body experiences     Social History     Socioeconomic History   . Marital status: Married  Spouse name: Not on file   . Number of children: Not on file   . Years of education: Not on file   . Highest education level: Not on file   Occupational History   . Not on file   Social Needs   . Financial resource strain: Not on file   . Food insecurity:     Worry: Not on file     Inability: Not on file   . Transportation needs:     Medical: Not on file     Non-medical: Not on file   Tobacco Use   . Smoking status: Former Smoker     Packs/day: 0.00   . Smokeless tobacco: Never Used   . Tobacco comment: quit 7 years ago    Substance and Sexual Activity   . Alcohol use: Yes     Alcohol/week: 1.0 standard drinks     Types: 1 Cans of beer per week     Comment: 1 pack in 6 months.   . Drug use: No   . Sexual activity: Never   Lifestyle   . Physical activity:     Days per week: Not on file     Minutes per session: Not on file   . Stress: Not on file   Relationships   . Social connections:     Talks on phone: Not on file     Gets together: Not on file     Attends religious service: Not on file     Active member of club or organization: Not on file     Attends meetings of clubs or organizations: Not on file     Relationship status: Not on file   . Intimate partner  violence:     Fear of current or ex partner: Not on file     Emotionally abused: Not on file     Physically abused: Not on file     Forced sexual activity: Not on file   Other Topics Concern   . Not on file   Social History Narrative   . Not on file     Current Outpatient Medications on File Prior to Visit   Medication Sig Dispense Refill   . aspirin 81 MG chewable tablet Chew 81 mg by mouth daily     . carvedilol (COREG) 6.25 MG tablet Take 6.25 mg by mouth every 12 (twelve) hours     . Evolocumab 140 MG/ML Solution Auto-injector Inject 1 mL into the skin every 14 (fourteen) days 12 pen 3   . ezetimibe (ZETIA) 10 MG tablet Take 10 mg by mouth daily        . furosemide (LASIX) 20 MG tablet Take 20 mg by mouth        . glucose blood (ONETOUCH VERIO) test strip Check blood sugars 4 xday 120 each 5   . insulin detemir (LEVEMIR FLEXTOUCH) 100 UNIT/ML injection pen Levemir FlexTouch U-100 Insulin 100 unit/mL (3 mL) subcutaneous pen : 50  Units SC bid 30 pen 1   . insulin lispro (HUMALOG KWIKPEN) 100 UNIT/ML injection pen Humalog KwikPen (U-100) Insulin 100 unit/mL subcutaneous per SSI 1-20 units premeals 15 pen 1   . Insulin Pen Needle 31G X 8 MM Misc Use with insulin pen 1000 each 3   . metFORMIN (GLUCOPHAGE-XR) 750 MG 24 hr tablet Take 1 tablet (750 mg total) by mouth 2 (two) times daily 60 tablet 4   . ondansetron (ZOFRAN-ODT) 4 MG disintegrating tablet Take 1 tablet (  4 mg total) by mouth every 6 (six) hours as needed for Nausea 12 tablet 0   . sacubitril-valsartan (ENTRESTO) 24-26 MG Tab per tablet 2 (two) times daily          No current facility-administered medications on file prior to visit.          Review of Systems  Constitutional: Negative for fever.   Respiratory: Negative for shortness of breath  Cardiovascular: Negative for chest pain.  Gastrointestinal: Negative for vomiting and abdominal pain.        Objective:     Vitals:    04/28/18 0900   BP: 149/84   Pulse: 84   SpO2: 96%   Weight: 92.7 kg (204 lb 6.4  oz)      General appearance - alert, well appearing, and in no distress  Mental status - alert, oriented to person, place, and time, normal mood, behavior, speech, dress, motor activity, and thought processes  HEENT- normocephalic, atraumatic, anicteric sclerae, EOMI, mucous membranes moist  Heart regular rate rhythm  Lungs clear b/l  Neurological - alert, oriented, normal speech, no focal findings or movement disorder noted  Musculoskeletal - no joint tenderness, deformity or swelling  Skin - normal color and turgor, no rashes      Lab Review       Ref. Range 02/12/2018 15:08 02/13/2018 13:55   Glucose Latest Ref Range: 70 - 100 mg/dL  161 (H)   BUN Latest Ref Range: 9 - 28 mg/dL  21   Creatinine Latest Ref Range: 0.7 - 1.3 mg/dL  1.4 (H)   Sodium Latest Ref Range: 136 - 145 mEq/L  130 (L)   Potassium Latest Ref Range: 3.5 - 5.1 mEq/L  5.5 (H)   Chloride Latest Ref Range: 100 - 111 mEq/L  97 (L)   Carbon Dioxide, Whole Blood Latest Ref Range: 22 - 29 mEq/L  22   Calcium Latest Ref Range: 8.5 - 10.5 mg/dL  9.1   Anion Gap Latest Ref Range: 5.0 - 15.0   11.0   EGFR Unknown  51.9   AST Whole Blood Latest Ref Range: 5 - 34 U/L  19   ALT, Whole Blood Latest Ref Range: 0 - 55 U/L  19   Alkaline Phosphatase Latest Ref Range: 38 - 106 U/L  81   Albumin Latest Ref Range: 3.5 - 5.0 g/dL  3.2 (L)   Protein, Total Latest Ref Range: 6.0 - 8.3 g/dL  6.4   Globulin Latest Ref Range: 2.0 - 3.6 g/dL  3.2   Albumin/Globulin Ratio Latest Ref Range: 0.9 - 2.2   1.0   Bilirubin, Total Latest Ref Range: 0.2 - 1.2 mg/dL  1.0   Lipase Latest Ref Range: 8 - 78 U/L  71   Troponin I Latest Ref Range: 0.00 - 0.09 ng/mL  0.03   Hemoglobin A1C Latest Ref Range: 3.9 - 5.9 % 12 (A)      Lab Results   Component Value Date    WBC 10.54 (H) 02/13/2018    HGB 16.1 02/13/2018    HCT 46.6 02/13/2018    PLT 290 02/13/2018    CHOL 318 (H) 02/20/2018    TRIG 244 (H) 02/20/2018    HDL 53 02/20/2018    LDL 216 (H) 02/20/2018    ALT 19 02/13/2018    AST 19  02/13/2018    NA 137 03/09/2018    K 5.5 (H) 03/09/2018    CL 100 03/09/2018    CREAT 1.5  03/09/2018    BUN 25.0 03/09/2018    CO2 26 03/09/2018    TSH 1.50 03/17/2018    GLU 322 (H) 03/09/2018    HGBA1C 12 (A) 02/12/2018         Assessment:   [name] is a 57 y.o. -year-old male who presents today for a f/u visit for type 1 diabetes, under poor control.     A1c 10 today       Plan:   ##DM:  Neuropathy  CAD s/p CABG  Retinopathy  CKD   A1c was 10 today which is above goal. Goal for this patient is 7.    CGMS showed global  And post prandial hyperglycemia in 10/19.    HE is requiring ~150u/d with BGS still in 200s and 300s, pt reports compliance. Was started on Metformin XR but could not tolerate.  Not a candidate for GLP!a/ DPP4i/ SGLT2i due to h/o pancreatitis.    Change Levemir to Tresiba 100 units once daily.    Take Humalog 1 unit for every 5 grams of carbohydrates (or 10 units for every meal) + additional insulin for correction of 1 unit for every 15 above 120.      HTN:  BP in clinic was not at goal. Recommend that patient monitors BP at home regularly, and discuss with PCP if running high.          Renaee Munda, MD, [facility]  [address]  [place], [place] [address]  Tel: [phone]  Fax: [phone]

## 2018-04-28 NOTE — Progress Notes (Signed)
Name:[name] Age: 57 y.o.   Date of Service: 04/28/2018  Referring Physician: Noel Christmas*   Date of Injury: 12/28/2017  Date Care Plan Established/Reviewed: 04/28/2018  Date Treatment Started: 03/04/2018  End of Certification Date: 06/26/2018  Sessions in Plan of Care: 3  Surgery Date: No data was found      Visit Count: 9   Diagnosis:   1. Muscle weakness        Subjective     History of Present Illness   Functional Limitations (PLOF): Able to walk up to 3 minutes (prior > 10 minutes)      Pain   Patient reported dragging sensation to right, with decrease right ankle DF. Challenge walking > 2 minutes. Able to transfer sit/stand in standard chair. Patient approved for Lasix surgery. Tightness sensation from bilateral thigh to feet.     Social Support/Occupation  Lives in: multiple level home  Lives with: spouse  Occupation: IT      Precautions: cardiac  diabetes  Allergies: Statins and Lyrica [pregabalin]    Past Medical History:   Diagnosis Date   . Dehydration    . Diabetes mellitus    . Dupre's syndrome    . Hypertension    . Migraine    . Neuropathy    . Pancreatitis    . Sleep apnea    . Type 1 diabetes mellitus                         Strength     04/28/18  Left 04/08/18  Left IE  Left Strength  Hip  PSI IE  Right 04/08/18  Right 04/28/18  Right    63.4 59.4 Hip Flexion 65.6 68.5       Hip Extension       43.2 36.3 Hip Abduction 43.1 40.3       Hip Adduction       27.6 30 Hip IR 32.1 35.6     28.8 30.7 Hip ER 30.1 30.8    39 38.8 46.1 Quadriceps 41.8 47.7 31.5   48.7 32.9 33.1 Hamstrings 43.7 33.9 54.7   24   Ankle DF   22.5   (blank fields were intentionally left blank)    Standing ASIS/PSIS Alignment: 04/15/18  Right innominate anterior rotation deviation.     TUG: 04/28/18  22 sec to 12 sec              ---      ---   Total Time   Timed Minutes  40 minutes   Total Time  40 minutes        Assessment   Patient has met TUG score, but not function. Regression noted with right quads.  Decrease ankle DF strength.     Justification: to improve LE proprioception and strength to reduce fall risk.       Prior Level of Function: Able to walk up to 3 minutes (prior > 10 minutes)    Plan   To continue for 1x/wk for 3 additional sessions, to focus on quad and ankle DF strength for ambulation.       Goals    Goal 1:  Increase TUG score from 22 sec to < 15 sec, to be able to walk > 7 minutes without fall/LOB.     Met TUG, but not function: 04/28/18   Sessions:  12   Progression:  progressing      Goal 2:  Increase  Quad Strength > 45 psi, to be able to negotiate stairs reciprocally, with bannister assistance.    Reassessed 04/08/18   Sessions:  12   Progression:  regressing       Goal 3:  Increase hip abduction strength > 42 psi to be able to independently transfer in/out of vehicle.     Reassessed 04/08/18   Sessions:  9   Progression:  met      Goal 4:  Increase FOTO score form 49 to > 59, to be able to get out of low chair/vehicle.     Met function   Sessions:  9   Progression:  met          Goal 5:  Patient will demonstrate independence in prescribed HEP with proper form, sets and reps for safe discharge to an independent program.   Sessions:  12   Progression:  progressing                         Harrell Gave, PT

## 2018-04-28 NOTE — Patient Instructions (Signed)
Change Levemir to Tresiba 100 units once daily.    Take Humalog 1 unit for every 5 grams of carbohydrates (or 10 units for every meal) + additional insulin for correction of 1 unit for every 15 above 120.

## 2018-04-29 ENCOUNTER — Ambulatory Visit (INDEPENDENT_AMBULATORY_CARE_PROVIDER_SITE_OTHER): Payer: Self-pay | Admitting: Cardiology

## 2018-05-01 ENCOUNTER — Encounter (INDEPENDENT_AMBULATORY_CARE_PROVIDER_SITE_OTHER): Payer: Self-pay

## 2018-05-07 ENCOUNTER — Encounter (INDEPENDENT_AMBULATORY_CARE_PROVIDER_SITE_OTHER): Payer: Self-pay | Admitting: Student in an Organized Health Care Education/Training Program

## 2018-05-07 ENCOUNTER — Inpatient Hospital Stay
Payer: No Typology Code available for payment source | Attending: Student in an Organized Health Care Education/Training Program

## 2018-05-07 DIAGNOSIS — M6281 Muscle weakness (generalized): Secondary | ICD-10-CM | POA: Insufficient documentation

## 2018-05-07 NOTE — PT/OT Therapy Note (Signed)
Name: [name] Age: 57 y.o.   Date of Service: 05/07/2018  Referring Physician: Noel Christmas*   Date of Injury: 12/28/2017  Date Care Plan Established/Reviewed: 04/28/2018  Date Treatment Started: 03/04/2018  End of Certification Date: 06/26/2018  Sessions in Plan of Care: 3  Surgery Date: No data was found    Visit Count: 10   Diagnosis:   1. Muscle weakness            Subjective     History of Present Illness   Functional Limitations (PLOF): Able to walk up to 3 minutes (prior > 10 minutes)      Pain   No new complaints.  No pain. Still decrease balance, but no falls.  Does report limited w/ stairs towards the end of the day.    Social Support/Occupation  Lives in: multiple level home  Lives with: spouse  Occupation: IT      Precautions: cardiac  diabetes  Allergies: Statins and Lyrica [pregabalin]                         Strength     04/28/18  Left 04/08/18  Left IE  Left Strength  Hip  PSI IE  Right 04/08/18  Right 04/28/18  Right    63.4 59.4 Hip Flexion 65.6 68.5       Hip Extension       43.2 36.3 Hip Abduction 43.1 40.3       Hip Adduction       27.6 30 Hip IR 32.1 35.6     28.8 30.7 Hip ER 30.1 30.8    39 38.8 46.1 Quadriceps 41.8 47.7 31.5   48.7 32.9 33.1 Hamstrings 43.7 33.9 54.7   24   Ankle DF   22.5   (blank fields were intentionally left blank)    Standing ASIS/PSIS Alignment: 04/15/18  Right innominate anterior rotation deviation.     TUG: 04/28/18  22 sec to 12 sec    Treatment     Therapeutic Exercises   Justification: To improve flexibility, strength and ROM.   See Flow Sheet    TC and VC to correct form of ex        Neuromuscular Re-Education   Justification: To improve motor control and proprioception.  See Flow Sheet    Balance ex as per flow sheet    Manual Therapy   Justification: To decrease soft tissue restrictions, to normalize alignment.   None           Exercise Flow Sheet    Exercise Specifics 03/09/18 03/16/18 03/23/18 04/02/18 04/15/18 04/20/18 04/28/18  05/07/18        Hip ER ROM Short sitting/ warm up  2x10 ea  AP > > Hip abd sitting   BTB above knee  20x    BTB Hip ER/IR at ankle   15x ea  AP Seated Sartorius  2x10 Bilat  QB   SL vectors with UE support  3"/5/3  B  VM Staggered Stance sit to stand at hi-lo table with greater troc above knee  2x10  QB Between tables    Narrow BOS EO  30"  GB    Comfortable stance EC  30"  GB    Semi tandem stance B 30"  GB    Comfortable stance reaching for cane 4 corners B 30"  Ea  GB          Mini squats In // 3x5 with  1-2' break in b/w sets  AP > > Hip scaption standing  10x ea   AP Stand at [facility] in staggered stance, wt shift to front with, extended forward knee and contral hip flex  2x10  Bilat  QB Hip ER in semi recline RTB  x20  VM longsit Ankle DF   RTB  2x10  QB longsit Ankle DF   RTB  2x10  GB      Seated calf raises    20x  Pressure on L>R  AP > > Seated   30x  Added DF raises  30x   AP  Alt Stool Scoot  2x Hall  QB LAQ  5" hold  2x10  VM   Seated ABC/abc  1x ea  QB High knees stepping over cones   x5 cone  Twice  GB        Bike Seat 15   4'   AP 5'  AP 5' AP Side Step  2x Hall  QB SS x1 hall  VM ( stopped because of pt's weakness) Seated LAQ with Ankle DF  2x10  QB RB  x5 min  GB        LAQ Short sitting   10x ea   AP LAQ with ball squeeze   20X   AP Semi-Reclined Wt bear and contralat hip flex  2x10  Alt  QB Semi-Reclined Wt bear and contralat hip flex  5" hold  2'  VM Stand at [employer] in runner stance SL to front leg and hip flex contral  2x15  Bilat  QB LAQ ecc lowering 4# 2x10  GB      Marching   Short sitting   20x   AP - Right SLR Flex in semi-reclined: 10x  QB B SLR in semi recline  x10  VM  B SLR in semi recline  x10  GB      HS curls   Standing in // bars   20x   AP Step ups 8''   Step tapping only  10x ea   AP SLS with 1 finger at [employer]  5" hold  15x  Bilat  QB SLS with 1 finger at [employer]  20"x5  Bilat  VM 30"  Bilat  QB Prone HS curls  4# 2x10  GB         Sit to stands With airex pad on chair    8x (1-2 min  break in b/w)  AP >  Hip IR in sitting   BTB  x20  VM   BTB  2x10  QB Hip IR in sitting   BTB  x20        Vectors HHA on right in //   L LE only  3x5'' for 3 sets  AP >  Sit to stand on hi-low table at   65 cms  2x10  VM - Sit to stand on hi-low table at   65 cms  2x10                 Negotiated 1 flight of stairs w/ reciprocal gait w/ rails and cane.  Slow cadence and has to look down for foot placement.                       Home Exercise Program                 (Initials = supervised exercise by clinician)       ---      ---  Total Time   Timed Minutes  40 minutes   Total Time  40 minutes        Assessment   Pt appropriately challenged w/ ex.  Still poor balance, specially w/o visual input and required close guarding.    Justification: to improve LE proprioception and strength to reduce fall risk.   Prior Level of Function: Able to walk up to 3 minutes (prior > 10 minutes)    Plan   To continue for 1x/wk for 2 additional sessions, to focus on quad and ankle DF strength for ambulation.          Updated FOTO:  03/23/18 FOTO score 43 (IE 49)    Goals    Goal 1:  Increase TUG score from 22 sec to < 15 sec, to be able to walk > 7 minutes without fall/LOB.     Met TUG, but not function: 04/28/18   Sessions:  12   Progression:  progressing      Goal 2:  Increase Quad Strength > 45 psi, to be able to negotiate stairs reciprocally, with bannister assistance.    Reassessed 04/08/18   Sessions:  12   Progression:  regressing       Goal 3:  Increase hip abduction strength > 42 psi to be able to independently transfer in/out of vehicle.     Reassessed 04/08/18   Sessions:  9   Progression:  met      Goal 4:  Increase FOTO score form 49 to > 59, to be able to get out of low chair/vehicle.     Met function   Sessions:  9   Progression:  met          Goal 5:  Patient will demonstrate independence in prescribed HEP with proper form, sets and reps for safe discharge to an independent program.   Sessions:  12   Progression:   progressing                         Francoise Ceo, PT

## 2018-05-08 NOTE — Progress Notes (Signed)
Phone call, spoke with pt, he wants the referral for him self, as per conversation with pcp     He is ok to see dr Christophe Louis, [facility] to mail it to him on Monday, I will call him to let him know the day is mailed. Thank you

## 2018-05-08 NOTE — Progress Notes (Signed)
Left voice mail to call me to [phone] so I can speak to pt or pt's wife about pcp information provided, to see if they want to go and see dr. Georgina Snell.

## 2018-05-10 ENCOUNTER — Other Ambulatory Visit (INDEPENDENT_AMBULATORY_CARE_PROVIDER_SITE_OTHER): Payer: Self-pay | Admitting: Student in an Organized Health Care Education/Training Program

## 2018-05-10 DIAGNOSIS — E1142 Type 2 diabetes mellitus with diabetic polyneuropathy: Secondary | ICD-10-CM

## 2018-05-10 DIAGNOSIS — R291 Meningismus: Secondary | ICD-10-CM

## 2018-05-11 NOTE — Progress Notes (Signed)
I called pt [phone], left him a vm to let him know today Monday 12.26.19 as promised, I am mailing him the referral for Dr. Lucianne Muss. Any questions, needs to call me.

## 2018-05-13 ENCOUNTER — Inpatient Hospital Stay
Payer: No Typology Code available for payment source | Attending: Student in an Organized Health Care Education/Training Program

## 2018-05-13 DIAGNOSIS — M6281 Muscle weakness (generalized): Secondary | ICD-10-CM | POA: Insufficient documentation

## 2018-05-13 NOTE — PT/OT Therapy Note (Signed)
Name: [name] Age: 57 y.o.   Date of Service: 05/13/2018  Referring Physician: Noel Christmas*   Date of Injury: 12/28/2017  Date Care Plan Established/Reviewed: 04/28/2018  Date Treatment Started: 03/04/2018  End of Certification Date: 06/26/2018  Sessions in Plan of Care: 3  Surgery Date: No data was found    Visit Count: 11   Diagnosis:   1. Muscle weakness            Subjective     History of Present Illness   Functional Limitations (PLOF): Able to walk up to 3 minutes (prior > 10 minutes)      Pain   Pt reports no falls, no more dizziness secondary to new medication. Pt states involuntary movement of his LE has gotten worse, has noticed inward motion of L ankle that feels like cramping. Stairs are getting more difficult and believes its due to neuropathy traveling up his LEs. Seeing a Retail buyer soon for this.     Social Support/Occupation  Lives in: multiple level home  Lives with: spouse  Occupation: IT      Precautions: cardiac  diabetes  Allergies: Statins and Lyrica [pregabalin]                         Strength     04/28/18  Left 04/08/18  Left IE  Left Strength  Hip  PSI IE  Right 04/08/18  Right 04/28/18  Right    63.4 59.4 Hip Flexion 65.6 68.5       Hip Extension       43.2 36.3 Hip Abduction 43.1 40.3       Hip Adduction       27.6 30 Hip IR 32.1 35.6     28.8 30.7 Hip ER 30.1 30.8    39 38.8 46.1 Quadriceps 41.8 47.7 31.5   48.7 32.9 33.1 Hamstrings 43.7 33.9 54.7   24   Ankle DF   22.5   (blank fields were intentionally left blank)    Standing ASIS/PSIS Alignment: 04/15/18  Right innominate anterior rotation deviation.     TUG: 04/28/18  22 sec to 12 sec    Treatment     Therapeutic Exercises   Justification: To improve flexibility, strength and ROM.   See Flow Sheet    TC and VC to correct form of ex        Neuromuscular Re-Education   Justification: To improve motor control and proprioception.  Rhythmic stabilization to L ankle     DF hold B 10''x8    Manual Therapy    Justification: To decrease soft tissue restrictions, to normalize alignment.   None           Exercise Flow Sheet    Exercise Specifics 03/09/18 03/16/18 03/23/18 04/02/18 04/15/18 04/20/18 04/28/18 05/07/18 05/13/18       Hip ER ROM Short sitting/ warm up  2x10 ea  AP > > Hip abd sitting   BTB above knee  20x    BTB Hip ER/IR at ankle   15x ea  AP Seated Sartorius  2x10 Bilat  QB   SL vectors with UE support  3"/5/3  B  VM Staggered Stance sit to stand at hi-lo table with greater troc above knee  2x10  QB Between tables    Narrow BOS EO  30"  GB    Comfortable stance EC  30"  GB    Semi tandem stance B 30"  GB    Comfortable stance reaching for cane 4 corners B 30"  Ea  GB   //    FT EC  1'  AP    Semi tandem B   30''   AP    Step ups 10x ea on airex   AP    Tandem walking 5 laps  AP       Mini squats In // 3x5 with 1-2' break in b/w sets  AP > > Hip scaption standing  10x ea   AP Stand at [facility] in staggered stance, wt shift to front with, extended forward knee and contral hip flex  2x10  Bilat  QB Hip ER in semi recline RTB  x20  VM longsit Ankle DF   RTB  2x10  QB longsit Ankle DF   RTB  2x10  GB longsit Ankle DF   RTB  2x10  AP     Seated calf raises    20x  Pressure on L>R  AP > > Seated   30x  Added DF raises  30x   AP  Alt Stool Scoot  2x Hall  QB LAQ  5" hold  2x10  VM   Seated ABC/abc  1x ea  QB High knees stepping over cones   x5 cone  Twice  GB >       Bike Seat 15   4'   AP 5'  AP 5' AP Side Step  2x Hall  QB SS x1 hall  VM ( stopped because of pt's weakness) Seated LAQ with Ankle DF  2x10  QB RB  x5 min  GB RB  x5 min  AP       LAQ Short sitting   10x ea   AP LAQ with ball squeeze   20X   AP Semi-Reclined Wt bear and contralat hip flex  2x10  Alt  QB Semi-Reclined Wt bear and contralat hip flex  5" hold  2'  VM Stand at [employer] in runner stance SL to front leg and hip flex contral  2x15  Bilat  QB LAQ ecc lowering 4# 2x10  GB LAQ ecc lowering 4# 2x10  AP     Marching   Short sitting   20x   AP - Right  SLR Flex in semi-reclined: 10x  QB B SLR in semi recline  x10  VM  B SLR in semi recline  x10  GB B SLR in semi recline  x10  GB     HS curls   Standing in // bars   20x   AP Step ups 8''   Step tapping only  10x ea   AP SLS with 1 finger at [employer]  5" hold  15x  Bilat  QB SLS with 1 finger at [employer]  20"x5  Bilat  VM 30"  Bilat  QB Prone HS curls  4# 2x10  GB Prone HS curls  4# 2x10  AP        Sit to stands With airex pad on chair    8x (1-2 min break in b/w)  AP >  Hip IR in sitting   BTB  x20  VM   BTB  2x10  QB Hip IR in sitting   BTB  x20 >       Vectors HHA on right in //   L LE only  3x5'' for 3 sets  AP >  Sit to stand on hi-low table at   65 cms  2x10  VM - Sit to stand on hi-low table at   65 cms  2x10 Squat c table touch and go   60 cms  2x10  AP                Negotiated 1 flight of stairs w/ reciprocal gait w/ rails and cane.  Slow cadence and has to look down for foot placement.                       Home Exercise Program                 (Initials = supervised exercise by clinician)       ---      ---   Total Time   Timed Minutes  45 minutes   Total Time  45 minutes        Assessment   Pt most challenged with removing visual stimulus during balance activity and tandem walking, in which he required full support of // and contact guard. No fatigue or difficulty with LE strengthening exercises. Improvements noted in anterior tibialis endurance during isometric holds today with increased repetitions.    Justification: to improve LE proprioception and strength to reduce fall risk.   Prior Level of Function: Able to walk up to 3 minutes (prior > 10 minutes)    Plan   To continue for 1x/wk for 2 additional sessions, to focus on quad and ankle DF strength for ambulation.          Updated FOTO:  03/23/18 FOTO score 43 (IE 49)    Goals    Goal 1:  Increase TUG score from 22 sec to < 15 sec, to be able to walk > 7 minutes without fall/LOB.     Met TUG, but not function: 04/28/18   Sessions:  12   Progression:   progressing      Goal 2:  Increase Quad Strength > 45 psi, to be able to negotiate stairs reciprocally, with bannister assistance.    Reassessed 04/08/18   Sessions:  12   Progression:  regressing       Goal 3:  Increase hip abduction strength > 42 psi to be able to independently transfer in/out of vehicle.     Reassessed 04/08/18   Sessions:  9   Progression:  met      Goal 4:  Increase FOTO score form 49 to > 59, to be able to get out of low chair/vehicle.     Met function   Sessions:  9   Progression:  met          Goal 5:  Patient will demonstrate independence in prescribed HEP with proper form, sets and reps for safe discharge to an independent program.   Sessions:  12   Progression:  progressing                         Gilmer Mor, PT

## 2018-05-22 ENCOUNTER — Inpatient Hospital Stay
Payer: No Typology Code available for payment source | Attending: Student in an Organized Health Care Education/Training Program

## 2018-05-22 DIAGNOSIS — M6281 Muscle weakness (generalized): Secondary | ICD-10-CM | POA: Insufficient documentation

## 2018-05-22 NOTE — PT/OT Therapy Note (Signed)
Name: [name] Age: 57 y.o.   Date of Service: 05/22/2018  Referring Physician: Noel Christmas*   Date of Injury: 12/28/2017  Date Care Plan Established/Reviewed: 04/28/2018  Date Treatment Started: 03/04/2018  End of Certification Date: 06/26/2018  Sessions in Plan of Care: 3  Surgery Date: No data was found    Visit Count: 12   Diagnosis:   1. Muscle weakness            Subjective     History of Present Illness   Functional Limitations (PLOF):       Pain   Patient reported he is able to negotiate stairs reciprocally, in am, but changes to intermittent step to mechanics, by end of day. He stated he is able to carry groceries up stairs, with bannister assistance.     Social Support/Occupation  Lives in: multiple level home  Lives with: spouse  Occupation: IT      Precautions: cardiac  diabetes  Allergies: Statins and Lyrica [pregabalin]                         Strength     05/22/18  Left 04/28/18  Left 04/08/18  Left IE  Left Strength  Hip  PSI IE  Right 04/08/18  Right 04/28/18  Right 05/22/18  Right     63.4 59.4 Hip Flexion 65.6 68.5         Hip Extension         43.2 36.3 Hip Abduction 43.1 40.3         Hip Adduction         27.6 30 Hip IR 32.1 35.6       28.8 30.7 Hip ER 30.1 30.8     34.9 39 38.8 46.1 Quadriceps 41.8 47.7 31.5 38.2   46.7 48.7 32.9 33.1 Hamstrings 43.7 33.9 54.7 58.9    24   Ankle DF   22.5    (blank fields were intentionally left blank)    Standing ASIS/PSIS Alignment: 04/15/18  Right innominate anterior rotation deviation.     TUG: 04/28/18  22 sec to 12 sec    Tandem Balance: 05/22/18  Right in Front: 60 sec  Left in Front: 40 sec    Treatment     Therapeutic Exercises   Justification: To improve flexibility, strength and ROM.   Demo/Reviewed/Given/Performed:      Access Code: [email or web address]   URL: [email or web address]   Date: 05/22/2018   Prepared by: Tasia Catchings     Exercises   Standing Alternating Knee Flexion with Ankle Weights - 10 reps - 3 sets - 1x daily -  7x weekly   Seated Long Arc Quad - 10 reps - 3 sets - 1x daily - 7x weekly   Side Stepping with Counter Support - 10 reps - 10 sets - 1x daily - 7x weekly   Forward Step Up with Counter Support - 10 reps - 2 sets - 1x daily - 7x weekly   Seated Hip External Rotation AROM - 10 reps - 2 sets - 1x daily - 7x weekly   Seated Hip Internal Rotation AROM - 10 reps - 2 sets - 1x daily - 7x weekly      Neuromuscular Re-Education   Justification: To improve motor control and proprioception.    Manual Therapy   Justification: To decrease soft tissue restrictions, to normalize alignment.   None           Exercise Flow  Sheet    Exercise Specifics 03/09/18 03/16/18 03/23/18 04/02/18 04/15/18 04/20/18 04/28/18 05/07/18 05/13/18       Hip ER ROM Short sitting/ warm up  2x10 ea  AP > > Hip abd sitting   BTB above knee  20x    BTB Hip ER/IR at ankle   15x ea  AP Seated Sartorius  2x10 Bilat  QB   SL vectors with UE support  3"/5/3  B  VM Staggered Stance sit to stand at hi-lo table with greater troc above knee  2x10  QB Between tables    Narrow BOS EO  30"  GB    Comfortable stance EC  30"  GB    Semi tandem stance B 30"  GB    Comfortable stance reaching for cane 4 corners B 30"  Ea  GB   //    FT EC  1'  AP    Semi tandem B   30''   AP    Step ups 10x ea on airex   AP    Tandem walking 5 laps  AP       Mini squats In // 3x5 with 1-2' break in b/w sets  AP > > Hip scaption standing  10x ea   AP Stand at [employer] in staggered stance, wt shift to front with, extended forward knee and contral hip flex  2x10  Bilat  QB Hip ER in semi recline RTB  x20  VM longsit Ankle DF   RTB  2x10  QB longsit Ankle DF   RTB  2x10  GB longsit Ankle DF   RTB  2x10  AP     Seated calf raises    20x  Pressure on L>R  AP > > Seated   30x  Added DF raises  30x   AP  Alt Stool Scoot  2x Hall  QB LAQ  5" hold  2x10  VM   Seated ABC/abc  1x ea  QB High knees stepping over cones   x5 cone  Twice  GB >       Bike Seat 15   4'   AP 5'  AP 5' AP Side Step  2x Hall  QB  SS x1 hall  VM ( stopped because of pt's weakness) Seated LAQ with Ankle DF  2x10  QB RB  x5 min  GB RB  x5 min  AP       LAQ Short sitting   10x ea   AP LAQ with ball squeeze   20X   AP Semi-Reclined Wt bear and contralat hip flex  2x10  Alt  QB Semi-Reclined Wt bear and contralat hip flex  5" hold  2'  VM Stand at [employer] in runner stance SL to front leg and hip flex contral  2x15  Bilat  QB LAQ ecc lowering 4# 2x10  GB LAQ ecc lowering 4# 2x10  AP     Marching   Short sitting   20x   AP - Right SLR Flex in semi-reclined: 10x  QB B SLR in semi recline  x10  VM  B SLR in semi recline  x10  GB B SLR in semi recline  x10  GB     HS curls   Standing in // bars   20x   AP Step ups 8''   Step tapping only  10x ea   AP SLS with 1 finger at [employer]  5" hold  15x  Bilat  QB SLS with 1 finger at island  20"x5  Bilat  VM 30"  Bilat  QB Prone HS curls  4# 2x10  GB Prone HS curls  4# 2x10  AP        Sit to stands With airex pad on chair    8x (1-2 min break in b/w)  AP >  Hip IR in sitting   BTB  x20  VM   BTB  2x10  QB Hip IR in sitting   BTB  x20 >       Vectors HHA on right in //   L LE only  3x5'' for 3 sets  AP >  Sit to stand on hi-low table at   65 cms  2x10  VM - Sit to stand on hi-low table at   65 cms  2x10 Squat c table touch and go   60 cms  2x10  AP                Negotiated 1 flight of stairs w/ reciprocal gait w/ rails and cane.  Slow cadence and has to look down for foot placement.                       Home Exercise Program                 (Initials = supervised exercise by clinician)       ---      ---   Total Time   Timed Minutes  40 minutes   Total Time  40 minutes        Assessment   Patient has made progress with LE strength and balance. Improve TUG score. Patent demonstrated correct mechanics with updated HEP. He was advised to continue with HEP and follow up with MD, if symptoms worsen.   Prior Level of Function:     Plan   Discharge secondary to goals met.          Updated FOTO:  03/23/18 FOTO score 43  (IE 49)    Goals    Goal 1:  Increase TUG score from 22 sec to < 15 sec, to be able to walk > 7 minutes without fall/LOB.     Met TUG, but not function: 04/28/18   Sessions:  12   Progression:  met      Goal 2:  Increase Quad Strength > 45 psi, to be able to negotiate stairs reciprocally, with bannister assistance.    Reassessed 05/22/18 (QB)   Sessions:  12   Progression:  met       Goal 3:  Increase hip abduction strength > 42 psi to be able to independently transfer in/out of vehicle.     Reassessed 04/08/18   Sessions:  9   Progression:  met      Goal 4:  Increase FOTO score form 49 to > 59, to be able to get out of low chair/vehicle.     Met function   Sessions:  9   Progression:  met          Goal 5:  Patient will demonstrate independence in prescribed HEP with proper form, sets and reps for safe discharge to an independent program.   Sessions:  12   Progression:  progressing                         Harrell Gave, PT  Access Code: [email or web address]   URL: [email or web address]   Date: 05/22/2018   Prepared by: Tasia Catchings     Exercises   Standing Alternating Knee Flexion with Ankle Weights - 10 reps - 3 sets - 1x daily - 7x weekly   Seated Long Arc Quad - 10 reps - 3 sets - 1x daily - 7x weekly   Side Stepping with Counter Support - 10 reps - 10 sets - 1x daily - 7x weekly   Forward Step Up with Counter Support - 10 reps - 2 sets - 1x daily - 7x weekly   Seated Hip External Rotation AROM - 10 reps - 2 sets - 1x daily - 7x weekly   Seated Hip Internal Rotation AROM - 10 reps - 2 sets - 1x daily - 7x weekly

## 2018-05-22 NOTE — Progress Notes (Signed)
Name:[name] Age: 57 y.o.   Date of Service: 05/22/2018  Referring Physician: Noel Christmas*   Date of Injury: 12/28/2017  Date Care Plan Established/Reviewed: 04/28/2018  Date Treatment Started: 03/04/2018  End of Certification Date: 06/26/2018  Sessions in Plan of Care: 3  Surgery Date: No data was found      Visit Count: 12   Diagnosis:   1. Muscle weakness             Precautions: cardiac  diabetes  Allergies: Statins and Lyrica [pregabalin]    Past Medical History:   Diagnosis Date   . Dehydration    . Diabetes mellitus    . Dupre's syndrome    . Hypertension    . Migraine    . Neuropathy    . Pancreatitis    . Sleep apnea    . Type 1 diabetes mellitus                     Subjective     History of Present Illness   Functional Limitations (PLOF):       Pain   Patient reported he is able to negotiate stairs reciprocally, in am, but changes to intermittent step to mechanics, by end of day. He stated he is able to carry groceries up stairs, with bannister assistance.     Social Support/Occupation  Lives in: multiple level home  Lives with: spouse  Occupation: IT      Precautions: cardiac  diabetes  Allergies: Statins and Lyrica [pregabalin]                         Strength     05/22/18  Left 04/28/18  Left 04/08/18  Left IE  Left Strength  Hip  PSI IE  Right 04/08/18  Right 04/28/18  Right 05/22/18  Right     63.4 59.4 Hip Flexion 65.6 68.5         Hip Extension         43.2 36.3 Hip Abduction 43.1 40.3         Hip Adduction         27.6 30 Hip IR 32.1 35.6       28.8 30.7 Hip ER 30.1 30.8     34.9 39 38.8 46.1 Quadriceps 41.8 47.7 31.5 38.2   46.7 48.7 32.9 33.1 Hamstrings 43.7 33.9 54.7 58.9    24   Ankle DF   22.5    (blank fields were intentionally left blank)    Standing ASIS/PSIS Alignment: 04/15/18  Right innominate anterior rotation deviation.     TUG: 04/28/18  22 sec to 12 sec    Tandem Balance: 05/22/18  Right in Front: 60 sec  Left in Front: 40 sec            ---      ---    Total Time   Timed Minutes  40 minutes   Total Time  40 minutes        Assessment   Patient has made progress with LE strength and balance. Improve TUG score. Patent demonstrated correct mechanics with updated HEP. He was advised to continue with HEP and follow up with MD, if symptoms worsen.   Prior Level of Function:     Plan   Discharge secondary to goals met.      Goals    Goal 1:  Increase TUG score from 22 sec to < 15 sec,  to be able to walk > 7 minutes without fall/LOB.     Met TUG, but not function: 04/28/18   Sessions:  12   Progression:  met      Goal 2:  Increase Quad Strength > 45 psi, to be able to negotiate stairs reciprocally, with bannister assistance.    Reassessed 05/22/18 (QB)   Sessions:  12   Progression:  met       Goal 3:  Increase hip abduction strength > 42 psi to be able to independently transfer in/out of vehicle.     Reassessed 04/08/18   Sessions:  9   Progression:  met      Goal 4:  Increase FOTO score form 49 to > 59, to be able to get out of low chair/vehicle.     Met function   Sessions:  9   Progression:  met          Goal 5:  Patient will demonstrate independence in prescribed HEP with proper form, sets and reps for safe discharge to an independent program.   Sessions:  12   Progression:  progressing                         Harrell Gave, PT

## 2018-05-28 ENCOUNTER — Other Ambulatory Visit (FREE_STANDING_LABORATORY_FACILITY): Payer: No Typology Code available for payment source

## 2018-05-28 DIAGNOSIS — E1065 Type 1 diabetes mellitus with hyperglycemia: Secondary | ICD-10-CM

## 2018-06-01 ENCOUNTER — Encounter: Payer: Self-pay | Admitting: Internal Medicine

## 2018-06-01 ENCOUNTER — Ambulatory Visit (INDEPENDENT_AMBULATORY_CARE_PROVIDER_SITE_OTHER): Payer: No Typology Code available for payment source | Admitting: Internal Medicine

## 2018-06-01 VITALS — BP 118/74 | HR 87 | Temp 98.3°F | Ht 74.0 in | Wt 210.4 lb

## 2018-06-01 DIAGNOSIS — E1065 Type 1 diabetes mellitus with hyperglycemia: Secondary | ICD-10-CM

## 2018-06-01 LAB — POCT HEMOGLOBIN A1C: POCT Hgb A1C: 9.7 % — AB (ref 3.9–5.9)

## 2018-06-01 LAB — GLUTAMIC ACID DECARBOXYLASE-65 ANTIBODY: GAD-65 Antibody: <5 (ref <5)

## 2018-06-01 LAB — POCT GLUCOSE: Whole Blood Glucose POCT: 191 mg/dL — AB (ref 70–100)

## 2018-06-01 MED ORDER — INSULIN DEGLUDEC 200 UNIT/ML SC SOPN
120.00 [IU] | PEN_INJECTOR | Freq: Every day | SUBCUTANEOUS | 5 refills | Status: DC
Start: 2018-06-01 — End: 2018-09-07

## 2018-06-01 NOTE — Progress Notes (Signed)
[name] is a 58 y.o. male who presents today for a f/u visit for type 1 diabetes.  PCP: Bouhouch, Jacqualyn Posey, MD  Patient is referred by: PCP      ##DM:  The patient's diagnosis of diabetes mellitus was made when he was 58yrs old, was initially told he was type 1 diabetic, but later was told he was probably type 2 diabetic 6 years back, but has always been managed on insulin.  He was diagnosed at age 38yrs, when he had polyuria, polydipsia, had BG >1000, was in the hospital. Never had DKA but has been having urine ketones on and off over the last 3-4 years per pt.  He states he has never been off of insulin for more than a day since his diagnosis. Family h/o DM2 in his mother.  C peptide was 1.3 in 09/19.    Patient is currently on:   Tresiba 100u d  Humalog based on portions of carbs and SS (1 unit for every 15 above 120). (1:5 carb ratio)    Medications not tolerated in past or contraindications: Metformin    Medication dosage review with [name] suggested compliance most of the time.    Pt had h/o pancreatitis 2 yrs back.   Did not tolerate Metformin XR due to diarrhea.    Pt did not want any sensors/ insulin pumps in the past, but he would like to get on 670G now.    No severe hypoglycemia occurred    He had Dexcom for a week in 10/19, showed global hyperglycemia and postprandial hyperglycemia.    Checking BGS 4xday  Fasting BG 250-350  Before lunch 130-300  Before dinner 200-300s  HS 150-300  No hypoglycemias <80.    Diet:  Semicompliant with diet, eats 3 meals/d  Prior formal diabetes education/ nutritional counseling has been performed    Exercise:  Limited from neuropathy      Diabetic complications:   Cardiovascular: CAD s/p CABG 2018, PCI 2013  Neuropathy: Yes. Last foot exam was 10/19 done here , following with wound care for rt foot ulcer and has healed well.  Retinopathy: Yes, h/o laser. Last eye exam was 12/18.   Renal: Yes, h/o proteinuria per pt.  Yes ACE-I/ARB  No Statin, did not  tolerate due to jt pains, on Zetia, PCSK9i  BP: at goal    HLP:  On Zetia, on Evolocumab since 12/19 prescribed by his cardiologist.    Past Medical History:   Diagnosis Date    Dehydration     Diabetes mellitus     Dupre's syndrome     Hypertension     Migraine     Neuropathy     Pancreatitis     Sleep apnea     Type 1 diabetes mellitus        Past Surgical History:   Procedure Laterality Date    BONE GRAFT      BYPASS, CAROTID-SUBCLAVIAN      CARDIAC CATHETERIZATION      EYE SURGERY      WRIST SURGERY       Family History   Problem Relation Age of Onset    Diabetes Mother     Heart disease Father        Allergies   Allergen Reactions    Statins      Other reaction(s): Arthralgia (Joint Pain)    Lyrica [Pregabalin]      Nerve pain, out of body experiences     Social History  Socioeconomic History    Marital status: Married     Spouse name: Not on file    Number of children: Not on file    Years of education: Not on file    Highest education level: Not on file   Occupational History    Not on file   Social Needs    Financial resource strain: Not on file    Food insecurity:     Worry: Not on file     Inability: Not on file    Transportation needs:     Medical: Not on file     Non-medical: Not on file   Tobacco Use    Smoking status: Former Smoker     Packs/day: 0.00    Smokeless tobacco: Never Used    Tobacco comment: quit 7 years ago    Substance and Sexual Activity    Alcohol use: Yes     Alcohol/week: 1.0 standard drinks     Types: 1 Cans of beer per week     Comment: 1 pack in 6 months.    Drug use: No    Sexual activity: Never   Lifestyle    Physical activity:     Days per week: Not on file     Minutes per session: Not on file    Stress: Not on file   Relationships    Social connections:     Talks on phone: Not on file     Gets together: Not on file     Attends religious service: Not on file     Active member of club or organization: Not on file     Attends meetings of clubs  or organizations: Not on file     Relationship status: Not on file    Intimate partner violence:     Fear of current or ex partner: Not on file     Emotionally abused: Not on file     Physically abused: Not on file     Forced sexual activity: Not on file   Other Topics Concern    Not on file   Social History Narrative    Not on file     Current Outpatient Medications on File Prior to Visit   Medication Sig Dispense Refill    Evolocumab 140 MG/ML Solution Auto-injector       aspirin 81 MG chewable tablet Chew 81 mg by mouth daily      carvedilol (COREG) 6.25 MG tablet Take 6.25 mg by mouth every 12 (twelve) hours      Evolocumab 140 MG/ML Solution Auto-injector Inject 1 mL into the skin every 14 (fourteen) days 12 pen 3    ezetimibe (ZETIA) 10 MG tablet Take 10 mg by mouth daily         furosemide (LASIX) 20 MG tablet Take 20 mg by mouth         glucose blood (ONETOUCH VERIO) test strip Check blood sugars 4 xday 120 each 5    Insulin Degludec (TRESIBA FLEXTOUCH) 200 UNIT/ML Solution Pen-injector Inject 100 Units into the skin daily 10 pen 5    insulin lispro (HUMALOG KWIKPEN) 100 UNIT/ML injection pen Humalog KwikPen (U-100) Insulin 100 unit/mL subcutaneous per SSI 1-20 units premeals 15 pen 1    Insulin Pen Needle 31G X 8 MM Misc Use with insulin pen 1000 each 3    ondansetron (ZOFRAN-ODT) 4 MG disintegrating tablet Take 1 tablet (4 mg total) by mouth every 6 (six) hours as needed  for Nausea 12 tablet 0    sacubitril-valsartan (ENTRESTO) 24-26 MG Tab per tablet 2 (two) times daily          No current facility-administered medications on file prior to visit.          Review of Systems  Constitutional: Negative for fever.   Respiratory: Negative for shortness of breath  Cardiovascular: Negative for chest pain.  Gastrointestinal: Negative for vomiting and abdominal pain.        Objective:     Vitals:    06/01/18 1427   BP: 118/74   BP Site: Right arm   Patient Position: Sitting   Cuff Size: Medium    Pulse: 87   Temp: 98.3 F (36.8 C)   TempSrc: Oral   SpO2: 97%   Weight: 95.4 kg (210 lb 6.4 oz)   Height: 1.88 m (6\' 2" )      General appearance - alert, well appearing, and in no distress  Mental status - alert, oriented to person, place, and time, normal mood, behavior, speech, dress, motor activity, and thought processes  HEENT- normocephalic, atraumatic, anicteric sclerae, EOMI, mucous membranes moist  Heart regular rate rhythm  Lungs clear b/l  Neurological - alert, oriented, normal speech, no focal findings or movement disorder noted  Musculoskeletal - no joint tenderness, deformity or swelling  Skin - normal color and turgor, no rashes      Lab Review       Ref. Range 02/12/2018 15:08 02/13/2018 13:55   Glucose Latest Ref Range: 70 - 100 mg/dL  161 (H)   BUN Latest Ref Range: 9 - 28 mg/dL  21   Creatinine Latest Ref Range: 0.7 - 1.3 mg/dL  1.4 (H)   Sodium Latest Ref Range: 136 - 145 mEq/L  130 (L)   Potassium Latest Ref Range: 3.5 - 5.1 mEq/L  5.5 (H)   Chloride Latest Ref Range: 100 - 111 mEq/L  97 (L)   Carbon Dioxide, Whole Blood Latest Ref Range: 22 - 29 mEq/L  22   Calcium Latest Ref Range: 8.5 - 10.5 mg/dL  9.1   Anion Gap Latest Ref Range: 5.0 - 15.0   11.0   EGFR Unknown  51.9   AST Whole Blood Latest Ref Range: 5 - 34 U/L  19   ALT, Whole Blood Latest Ref Range: 0 - 55 U/L  19   Alkaline Phosphatase Latest Ref Range: 38 - 106 U/L  81   Albumin Latest Ref Range: 3.5 - 5.0 g/dL  3.2 (L)   Protein, Total Latest Ref Range: 6.0 - 8.3 g/dL  6.4   Globulin Latest Ref Range: 2.0 - 3.6 g/dL  3.2   Albumin/Globulin Ratio Latest Ref Range: 0.9 - 2.2   1.0   Bilirubin, Total Latest Ref Range: 0.2 - 1.2 mg/dL  1.0   Lipase Latest Ref Range: 8 - 78 U/L  71   Troponin I Latest Ref Range: 0.00 - 0.09 ng/mL  0.03   Hemoglobin A1C Latest Ref Range: 3.9 - 5.9 % 12 (A)      Lab Results   Component Value Date    WBC 10.54 (H) 02/13/2018    HGB 16.1 02/13/2018    HCT 46.6 02/13/2018    PLT 290 02/13/2018    CHOL 318  (H) 02/20/2018    TRIG 244 (H) 02/20/2018    HDL 53 02/20/2018    LDL 216 (H) 02/20/2018    ALT 19 02/13/2018    AST 19 02/13/2018  NA 137 03/09/2018    K 5.5 (H) 03/09/2018    CL 100 03/09/2018    CREAT 1.5 03/09/2018    BUN 25.0 03/09/2018    CO2 26 03/09/2018    TSH 1.50 03/17/2018    GLU 322 (H) 03/09/2018    HGBA1C 9.7 (A) 06/01/2018         Assessment:   [name] is a 58 y.o. -year-old male who presents today for a f/u visit for type 1 diabetes, under poor control.     A1c 9.7.       Plan:   ##DM:  Neuropathy  CAD s/p CABG  Retinopathy  CKD   A1c was 9.7 today which is above goal. Goal for this patient is 7.    HE is requiring ~150u/d with BGS still in 200s and 300s, pt reports compliance. Was started on Metformin XR but could not tolerate.  Not a candidate for GLP!a/ DPP4i/ SGLT2i due to h/o pancreatitis.    Increase Tresiba to 120 units once daily.    Take Humalog 1 unit for every 5 grams of carbohydrates (or 10 units for every meal) + additional insulin for correction of 1 unit for every 15 above 120.    Referred to pump program as he would like to have 670G.      HTN:  BP in clinic was at goal. Recommend that patient monitors BP at home regularly, and discuss with PCP if running high.          Renaee Munda, MD, [facility]  [address]  [place], [place] [address]  Tel: [phone]  Fax: [phone]

## 2018-06-01 NOTE — Patient Instructions (Signed)
Increase Tresiba to 120 units once daily.    Take Humalog 1 unit for every 5 grams of carbohydrates (or 10 units for every meal) + additional insulin for correction of 1 unit for every 15 above 120.

## 2018-06-04 ENCOUNTER — Encounter: Payer: Self-pay | Admitting: Internal Medicine

## 2018-06-12 ENCOUNTER — Ambulatory Visit (INDEPENDENT_AMBULATORY_CARE_PROVIDER_SITE_OTHER)
Payer: No Typology Code available for payment source | Admitting: Student in an Organized Health Care Education/Training Program

## 2018-06-12 ENCOUNTER — Encounter (INDEPENDENT_AMBULATORY_CARE_PROVIDER_SITE_OTHER): Payer: Self-pay | Admitting: Student in an Organized Health Care Education/Training Program

## 2018-06-12 VITALS — BP 132/81 | HR 76 | Temp 97.7°F | Resp 13 | Wt 212.8 lb

## 2018-06-12 DIAGNOSIS — N529 Male erectile dysfunction, unspecified: Secondary | ICD-10-CM

## 2018-06-12 DIAGNOSIS — J0100 Acute maxillary sinusitis, unspecified: Secondary | ICD-10-CM

## 2018-06-12 MED ORDER — SILDENAFIL CITRATE 50 MG PO TABS
50.00 mg | ORAL_TABLET | Freq: Every day | ORAL | 3 refills | Status: AC | PRN
Start: 2018-06-12 — End: ?

## 2018-06-12 MED ORDER — AMOXICILLIN-POT CLAVULANATE 875-125 MG PO TABS
1.00 | ORAL_TABLET | Freq: Two times a day (BID) | ORAL | 0 refills | Status: AC
Start: 2018-06-12 — End: 2018-06-22

## 2018-06-12 NOTE — Progress Notes (Signed)
Have you seen any specialists/other providers since your last visit with [place]?    No    Arm preference verified?   Yes    The patient is due for eye exam and [facility], HEP C

## 2018-06-12 NOTE — Progress Notes (Signed)
Chief Complaint   Patient presents with    Leg Pain     pt here for FU after finished PT he said he didnt see any improvement     Sinusitis     x 2 weeks        [name] is a 58 y.o. male who presents for evaluation of  Above problem   Patient Reports to   Symptoms of sore throat   initially   progressively getting worse, followed by nasal congestion and productive cough with yellow phlegm.No  associated sinus pressure , no ear ache or discharge  or tinnitus.  , no voice change.    No fevers or chills, no Travel,  No sick contact,  Took  OTC with mild- moderate relief       He also  has sensory loss throughout most of his body. This is worst in the legs where there has been a clear length-dependent progression but he feels he has a similar symptom throughout most of his body. He has trouble with things like opening packages or buttoning buttons because of sensory loss in his hands. He has some tingling in the 5th finger on the left that can be painful at times.    He also feels like his legs have "stopped working." This occurs after walking for a short distance, after which he has to stop to rest because of weakness and pain. He walks with a cane to help with balance. He tried PT w/o help \    Has tried TCA, cymbalta, lyrica, gabapentin w/o help       Past Medical History:   Diagnosis Date    Dehydration     Diabetes mellitus     Dupre's syndrome     Hypertension     Migraine     Neuropathy     Pancreatitis     Sleep apnea     Type 1 diabetes mellitus       Past Surgical History:   Procedure Laterality Date    BONE GRAFT      BYPASS, CAROTID-SUBCLAVIAN      CARDIAC CATHETERIZATION      EYE SURGERY      WRIST SURGERY        (Not in a hospital admission)    Current/Home Medications    ASPIRIN 81 MG CHEWABLE TABLET    Chew 81 mg by mouth daily    CARVEDILOL (COREG) 6.25 MG TABLET    Take 6.25 mg by mouth every 12 (twelve) hours    EVOLOCUMAB 140 MG/ML SOLUTION AUTO-INJECTOR    Inject 1 mL into  the skin every 14 (fourteen) days    EVOLOCUMAB 140 MG/ML SOLUTION AUTO-INJECTOR        EZETIMIBE (ZETIA) 10 MG TABLET    Take 10 mg by mouth daily       FUROSEMIDE (LASIX) 20 MG TABLET    Take 20 mg by mouth       GLUCOSE BLOOD (ONETOUCH VERIO) TEST STRIP    Check blood sugars 4 xday    INSULIN DEGLUDEC (TRESIBA FLEXTOUCH) 200 UNIT/ML SOLUTION PEN-INJECTOR    Inject 120 Units into the skin daily    INSULIN LISPRO (HUMALOG KWIKPEN) 100 UNIT/ML INJECTION PEN    Humalog KwikPen (U-100) Insulin 100 unit/mL subcutaneous per SSI 1-20 units premeals    INSULIN PEN NEEDLE 31G X 8 MM MISC    Use with insulin pen    ONDANSETRON (ZOFRAN-ODT) 4 MG DISINTEGRATING TABLET  Take 1 tablet (4 mg total) by mouth every 6 (six) hours as needed for Nausea    SACUBITRIL-VALSARTAN (ENTRESTO) 24-26 MG TAB PER TABLET    2 (two) times daily        Allergies   Allergen Reactions    Statins      Other reaction(s): Arthralgia (Joint Pain)    Lyrica [Pregabalin]      Nerve pain, out of body experiences      Social History     Tobacco Use    Smoking status: Former Smoker     Packs/day: 0.00    Smokeless tobacco: Never Used    Tobacco comment: quit 7 years ago    Substance Use Topics    Alcohol use: Yes     Alcohol/week: 1.0 standard drinks     Types: 1 Cans of beer per week     Comment: 1 pack in 6 months.      Family History   Problem Relation Age of Onset    Diabetes Mother     Heart disease Father         Review of Systems - General ROS: negative for - chills, +fatigue, no fever or malaise  Ophthalmic ROS: negative for - excessive tearing or itchy eyes  ENT ROS: positive for - headaches, nasal congestion, sinus pain and sore throat  negative for - tinnitus, vertigo or visual changes  Allergy and Immunology ROS: negative for - itchy/watery eyes, postnasal drip or seasonal allergies  Respiratory ROS: positive for - cough and sputum changes  negative for - shortness of breath or wheezing  Cardiovascular ROS: no chest pain or dyspnea on  exertion  +sensory loss, weakness  Other ROS were reviewed and were neg          reviewed vitals as below  BP 132/81    Pulse 76    Temp 97.7 F (36.5 C)    Resp 13    Wt 96.5 kg (212 lb 12.8 oz)    SpO2 98%    BMI 27.32 kg/m       Physical Examination: General appearance - alert, well appearing, and in no distress  Mental status - alert, oriented to person, place, and time  Eyes - pupils equal and reactive, extraocular eye movements intact  Ears - bilateral TM's and external ear canals normal  Nose - normal and patent, no erythema, discharge or polyps and normal nontender sinuses  Mouth - erythematous pharynx  and tonsils normal  Neck - bilateral symmetric anterior adenopathy  Chest - clear to auscultation, no wheezes, rales or rhonchi, symmetric air entry  Heart - normal rate, regular rhythm, normal S1, S2, no murmurs, rubs, clicks or gallops  EXt : sensory loss over b/llower ext and upper ext       A/P    1. Acute non-recurrent maxillary sinusitis  amoxicillin-clavulanate (AUGMENTIN) 875-125 MG per tablet   2. Erectile dysfunction, unspecified erectile dysfunction type  sildenafil (VIAGRA) 50 MG tablet     See neuro for peripheral neuropathy   Home Care:  Drink plenty of water, hot tea, and other liquids to stay well hydrated. This thins the mucus and promotes sinus drainage.  Apply heat to the painful areas of the face. Use a towel soaked in hot water. Or, stand in the shower and direct the hot spray onto your face. This is a good way to inhale warm water vapor and get heat on your face at the same time. (Cover your mouth  and nose with your hands so you can still breathe as you do this.)  Use a vaporizer with products such as Vicks VapoRub (contains menthol) at night. Suck on peppermint, menthol or eucalyptus hard candies during the day.

## 2018-06-13 ENCOUNTER — Encounter (INDEPENDENT_AMBULATORY_CARE_PROVIDER_SITE_OTHER): Payer: Self-pay | Admitting: Student in an Organized Health Care Education/Training Program

## 2018-06-30 ENCOUNTER — Telehealth: Payer: Self-pay | Admitting: Internal Medicine

## 2018-06-30 ENCOUNTER — Ambulatory Visit (INDEPENDENT_AMBULATORY_CARE_PROVIDER_SITE_OTHER): Payer: No Typology Code available for payment source

## 2018-06-30 DIAGNOSIS — E1065 Type 1 diabetes mellitus with hyperglycemia: Secondary | ICD-10-CM

## 2018-06-30 NOTE — Telephone Encounter (Signed)
-----   Message from Karen Chafe, RD sent at 06/30/2018 11:55 AM EST -----  Regarding: Need additional Orders for services  Dr. Karie Schwalbe, this patient needs to go through another CGM trial before I put him in a saline trial. He also is going to try out the [place] pump, so I need two Orders for him:    First: Insulin Pump Program (this includes all steps in our Pump Start Program, including adjustments during the initial few months, with your blessing)    Second: CGM trial, Dexcom    If you could please go ahead and put in Orders for this, I'll go ahead and schedule him today for the next step.    Let's review the steps in our Pump Program together and also what the Orders should say when you refer someone for this.    Thanks!    Jae Dire

## 2018-06-30 NOTE — Telephone Encounter (Signed)
Done

## 2018-06-30 NOTE — Progress Notes (Signed)
[name] attended an individual Pump Orientation today. He was referred by Renaee Munda, MD for the [facility] Insulin Pump Program. Topics covered included basic of pumping, potential risks and benefits, and the [facility] Pump Program Steps. Demonstrated pumps and infusion sets and provided company information for further consideration.    Plan: He is scheduled for a CGM trial on 07/07/2018. Saline trial on the Medtronic 630G is scheduled 07/24/2018. Request for Orders was sent to his provider.

## 2018-07-04 NOTE — Progress Notes (Signed)
CGM Start Visit    [name] was referred by Dr.Thukuntla, Lind[place] Coke, and is here today for a Continuous Glucose Monitoring (CGM) trial on the [facility] G4 Professional. Others present include patient.     CGM unit Dexcom (Transmitter [id number] and Receiver SN/# [id number]) were used today.    Education outcomes: Fully Achieved    Barriers to learning:None identified    Education and procedures:   Used Quick Start guide to introduce patient/parent to the receiver (home screen information and use of function buttons) and parts of the device worn by the patient (sensor and transmitter)   Had patient/parent use receiver to do the following:   Put in/confirm transmitter ID     Put in/confirm date and time   Set Alert levels for low and high (250 and 80)   Set profiles for Alerts and Alarms   Trainer inserted sensor pod on left side abdomen (patient's body area).    Patient/parent started the sensor session using the receiver.   Trainer explained the following:   Sensor start up period   How and when to calibrate; at least twice daily, 3-4 times preferable   When NOT to calibrate   Ending the sensor session (automatic) and removing sensor/transmitter   Returning equipment and materials   Troubleshooting   Use of directional arrows   BG display on receiver vs real time (finger stick)   Continue usual finger stick BG tests, 4-5 daily plus as needed for symptoms of lows   How to complete pump records during trial period   Avoiding medications with acetaminophen in them   CGM tips sheet (handout)    The receiver was reading the transmitter before patient left the appointment. The trainer provided contact information.      Plan:   Patient will return all equipment and materials by 07/16/2018.    Educator to notify the medical provider after data is downloaded to the patient's record.     (If provider does not have access to Epic, the original reports will be given to the patient to bring to their  provider, or sent to the provider by mail).    Comments: Patient is getting CGM trail as part of going on  insulin pump. He scheduled for download and will return with food records.      Kirt Boys, BSN,CDE  Diabetes Nurse Educator.

## 2018-07-06 ENCOUNTER — Ambulatory Visit (HOSPITAL_BASED_OUTPATIENT_CLINIC_OR_DEPARTMENT_OTHER): Payer: No Typology Code available for payment source

## 2018-07-06 ENCOUNTER — Encounter (HOSPITAL_BASED_OUTPATIENT_CLINIC_OR_DEPARTMENT_OTHER): Payer: Self-pay

## 2018-07-06 VITALS — Wt 212.0 lb

## 2018-07-06 DIAGNOSIS — E1065 Type 1 diabetes mellitus with hyperglycemia: Secondary | ICD-10-CM

## 2018-07-16 ENCOUNTER — Other Ambulatory Visit (FREE_STANDING_LABORATORY_FACILITY): Payer: No Typology Code available for payment source

## 2018-07-16 ENCOUNTER — Encounter: Payer: Self-pay | Admitting: Internal Medicine

## 2018-07-16 ENCOUNTER — Encounter (HOSPITAL_BASED_OUTPATIENT_CLINIC_OR_DEPARTMENT_OTHER): Payer: Self-pay

## 2018-07-16 ENCOUNTER — Emergency Department: Payer: No Typology Code available for payment source

## 2018-07-16 ENCOUNTER — Observation Stay
Admission: EM | Admit: 2018-07-16 | Discharge: 2018-07-16 | Disposition: A | Discharge status: Home / Self Care | Payer: No Typology Code available for payment source | Attending: Emergency Medical Services | Admitting: Emergency Medical Services

## 2018-07-16 ENCOUNTER — Ambulatory Visit (INDEPENDENT_AMBULATORY_CARE_PROVIDER_SITE_OTHER): Payer: No Typology Code available for payment source

## 2018-07-16 VITALS — Wt 212.0 lb

## 2018-07-16 DIAGNOSIS — R079 Chest pain, unspecified: Secondary | ICD-10-CM | POA: Insufficient documentation

## 2018-07-16 DIAGNOSIS — R778 Other specified abnormalities of plasma proteins: Secondary | ICD-10-CM

## 2018-07-16 DIAGNOSIS — Z794 Long term (current) use of insulin: Secondary | ICD-10-CM | POA: Insufficient documentation

## 2018-07-16 DIAGNOSIS — Z8249 Family history of ischemic heart disease and other diseases of the circulatory system: Secondary | ICD-10-CM | POA: Insufficient documentation

## 2018-07-16 DIAGNOSIS — E1051 Type 1 diabetes mellitus with diabetic peripheral angiopathy without gangrene: Secondary | ICD-10-CM | POA: Insufficient documentation

## 2018-07-16 DIAGNOSIS — E1065 Type 1 diabetes mellitus with hyperglycemia: Secondary | ICD-10-CM

## 2018-07-16 DIAGNOSIS — E7800 Pure hypercholesterolemia, unspecified: Secondary | ICD-10-CM

## 2018-07-16 DIAGNOSIS — Z951 Presence of aortocoronary bypass graft: Secondary | ICD-10-CM | POA: Insufficient documentation

## 2018-07-16 DIAGNOSIS — N289 Disorder of kidney and ureter, unspecified: Secondary | ICD-10-CM | POA: Insufficient documentation

## 2018-07-16 DIAGNOSIS — E1040 Type 1 diabetes mellitus with diabetic neuropathy, unspecified: Secondary | ICD-10-CM | POA: Insufficient documentation

## 2018-07-16 DIAGNOSIS — R7989 Other specified abnormal findings of blood chemistry: Secondary | ICD-10-CM

## 2018-07-16 DIAGNOSIS — Z833 Family history of diabetes mellitus: Secondary | ICD-10-CM | POA: Insufficient documentation

## 2018-07-16 DIAGNOSIS — Z7982 Long term (current) use of aspirin: Secondary | ICD-10-CM | POA: Insufficient documentation

## 2018-07-16 DIAGNOSIS — R291 Meningismus: Secondary | ICD-10-CM | POA: Insufficient documentation

## 2018-07-16 DIAGNOSIS — E785 Hyperlipidemia, unspecified: Secondary | ICD-10-CM | POA: Insufficient documentation

## 2018-07-16 DIAGNOSIS — Z87891 Personal history of nicotine dependence: Secondary | ICD-10-CM | POA: Insufficient documentation

## 2018-07-16 DIAGNOSIS — E875 Hyperkalemia: Secondary | ICD-10-CM | POA: Insufficient documentation

## 2018-07-16 DIAGNOSIS — G4730 Sleep apnea, unspecified: Secondary | ICD-10-CM | POA: Insufficient documentation

## 2018-07-16 DIAGNOSIS — R0789 Other chest pain: Principal | ICD-10-CM | POA: Insufficient documentation

## 2018-07-16 DIAGNOSIS — I10 Essential (primary) hypertension: Secondary | ICD-10-CM | POA: Insufficient documentation

## 2018-07-16 LAB — PT AND APTT
PT INR: 0.9 (ref 0.9–1.1)
PT: 12.5 s — ABNORMAL LOW (ref 12.6–15.0)
PTT: 24 s (ref 23–37)

## 2018-07-16 LAB — CBC AND DIFFERENTIAL
Absolute NRBC: 0 10*3/uL (ref 0.00–0.00)
Basophils Absolute Automated: 0.09 10*3/uL — ABNORMAL HIGH (ref 0.00–0.08)
Basophils Automated: 0.9 %
Eosinophils Absolute Automated: 1.57 10*3/uL — ABNORMAL HIGH (ref 0.00–0.44)
Eosinophils Automated: 15.6 %
Hematocrit: 42.6 % (ref 37.6–49.6)
Hgb: 14.8 g/dL (ref 12.5–17.1)
Immature Granulocytes Absolute: 0.02 10*3/uL (ref 0.00–0.07)
Immature Granulocytes: 0.2 %
Lymphocytes Absolute Automated: 2.37 10*3/uL (ref 0.42–3.22)
Lymphocytes Automated: 23.5 %
MCH: 31.2 pg (ref 25.1–33.5)
MCHC: 34.7 g/dL (ref 31.5–35.8)
MCV: 89.7 fL (ref 78.0–96.0)
MPV: 9.4 fL (ref 8.9–12.5)
Monocytes Absolute Automated: 1.04 10*3/uL — ABNORMAL HIGH (ref 0.21–0.85)
Monocytes: 10.3 %
Neutrophils Absolute: 4.98 10*3/uL (ref 1.10–6.33)
Neutrophils: 49.5 %
Nucleated RBC: 0 /100 WBC (ref 0.0–0.0)
Platelets: 199 10*3/uL (ref 142–346)
RBC: 4.75 10*6/uL (ref 4.20–5.90)
RDW: 13 % (ref 11–15)
WBC: 10.07 10*3/uL — ABNORMAL HIGH (ref 3.10–9.50)

## 2018-07-16 LAB — BASIC METABOLIC PANEL
Anion Gap: 12 (ref 5.0–15.0)
BUN: 39 mg/dL — ABNORMAL HIGH (ref 9–28)
CO2: 18 mEq/L — ABNORMAL LOW (ref 22–29)
Calcium: 8.8 mg/dL (ref 8.5–10.5)
Chloride: 107 mEq/L (ref 100–111)
Creatinine: 1.8 mg/dL — ABNORMAL HIGH (ref 0.7–1.3)
Glucose: 137 mg/dL — ABNORMAL HIGH (ref 70–100)
Potassium: 4.8 mEq/L (ref 3.5–5.1)
Sodium: 137 mEq/L (ref 136–145)

## 2018-07-16 LAB — LIPID PANEL
Cholesterol / HDL Ratio: 3.6
Cholesterol: 182 mg/dL (ref 0–199)
HDL: 51 mg/dL (ref 40–9999)
LDL Calculated: 64 mg/dL (ref 0–99)
Triglycerides: 335 mg/dL — ABNORMAL HIGH (ref 34–149)
VLDL Calculated: 67 mg/dL — ABNORMAL HIGH (ref 10–40)

## 2018-07-16 LAB — TROPONIN I
Troponin I: 0.06 ng/mL — ABNORMAL HIGH (ref 0.00–0.05)
Troponin I: 0.05 ng/mL (ref 0.00–0.05)

## 2018-07-16 LAB — B-TYPE NATRIURETIC PEPTIDE: B-Natriuretic Peptide: 257 pg/mL — ABNORMAL HIGH (ref 0–100)

## 2018-07-16 LAB — HEMOLYSIS INDEX: Hemolysis Index: 16 (ref 0–18)

## 2018-07-16 LAB — GFR: EGFR: 38.8

## 2018-07-16 NOTE — ED Triage Notes (Signed)
Pt arrives via EMS for sudden onset left sided chest pain while at work. Pt has history of quadruple bypass in 2018. Pt took one Nitro tablet before EMS arrival. EMS gave 162mg  Aspirin. Pt alert and oriented. Pt notes the chest pain is now at his baseline of 3/10. Denies shortness of breath. 18G IV established by EMS to left wrist. Saline locked.

## 2018-07-16 NOTE — Discharge Instructions (Signed)
Dear Mr. Calvin Jablonowski:    I appreciate your choosing the [facility] for your healthcare needs, and hope your visit today was EXCELLENT.    Instructions:  Please follow-up with Dr. Marcelo Baldy in 1-2 days.    Return to the Emergency Department for any worsening symptoms or concerns.    Below is some information that our patients often find helpful.    We wish you good health and please do not hesitate to contact us if we can ever be of any assistance.    Sincerely,  Harden Mo, MD  [facility]    ________________________________________________________________    If you do not continue to improve or your condition worsens, please contact your doctor or return immediately to the Emergency Department.    Thank you for choosing [facility] for your emergency care needs.  We strive to provide EXCELLENT care to you and your family.      DOCTOR REFERRALS  Call [phone] if you need any further referrals and we can help you find a primary care doctor or specialist.  Also, available online at:  [email or web address]    YOUR CONTACT INFORMATION  Before leaving please check with registration to make sure we have an up-to-date contact number.  You can call registration at [phone] to update your information.  For questions about your hospital bill, please call [phone].  For questions about your Emergency Dept Physician bill please call [phone].      FREE HEALTH SERVICES  If you need help with health or social services, please call 2-1-1 for a free referral to resources in your area.  2-1-1 is a free service connecting people with information on health insurance, free clinics, pregnancy, mental health, dental care, food assistance, housing, and substance abuse counseling.  Also, available online at:  http://[email or web address].org    MEDICAL RECORDS AND TESTS  Certain laboratory test results do not come back the same day, for example  urine cultures.   We will contact you if other important findings are noted.  Radiology films are often reviewed again to ensure accuracy.  If there is any discrepancy, we will notify you.      Please call [phone] to pick up a complimentary CD of any radiology studies performed.  If you or your doctor would like to request a copy of your medical records, please call [phone].      ORTHOPEDIC INJURY   Please know that significant injuries can exist even when an initial x-ray is read as normal or negative.  This can occur because some fractures (broken bones) are not initially visible on x-rays.  For this reason, close outpatient follow-up with your primary care doctor or bone specialist (orthopedist) is required.    MEDICATIONS AND FOLLOWUP  Please be aware that some prescription medications can cause drowsiness.  Use caution when driving or operating machinery.    The examination and treatment you have received in our Emergency Department is provided on an emergency basis, and is not intended to be a substitute for your primary care physician.  It is important that your doctor checks you again and that you report any new or remaining problems at that time.      24 [facility] PHARMACIES  [facility] - [address], [place], [place] [address] (1.4 miles, 7 minutes)  [facility] - [address], [place], [place] [address] (6.5 miles, 13 minutes)  Handout with directions available on request  PATIENT RELATIONS  If you have any concerns, issues, or feedback related to your care, positive or negative, please do not hesitate to contact Patient Relations at [phone]. They are open from 8:30AM-5:00PM Monday through Friday.     Marland Kitchen

## 2018-07-16 NOTE — ED Provider Notes (Signed)
Physician/Midlevel provider first contact with patient: 07/16/18 1516         EMERGENCY DEPARTMENT HISTORY AND PHYSICAL EXAM    Date: 07/16/18  Patient Name: [name],[name]  Attending Physician: Blanche East, MD  Patient DOB:  08/22/1960  MRN:  [id number]  Room:  20/B20        History of Presenting Illness     Chief Complaint:    Chief Complaint   Patient presents with    Chest Pain       Historian: Patient    57 y.o. male with h/o DM type I, CABG, and HTN, presents via EMS with persistent "sharp, burning" L sided CP beginning ~1 hr PTA, which sitting at his desk at work. Pt states his CP improved for about 30 minutes after taking nitro, and then returned. He also checked his blood sugar level and found it to be 116. Associated with diaphoresis and dizziness. Denies SOB or HA. Pt states his most recent stress test occurred within the past year, which was normal. He denies taking any anticoagulants.      PMD: Bouhouch, Jacqualyn Posey, MD   Cardiologist: [facility]      Past Medical History     Past Medical History:   Diagnosis Date    Dehydration     Diabetes mellitus     Dupre's syndrome     Hypertension     Migraine     Neuropathy     Pancreatitis     Sleep apnea     Type 1 diabetes mellitus        Past Surgical History     Past Surgical History:   Procedure Laterality Date    BONE GRAFT      BYPASS, CAROTID-SUBCLAVIAN      CARDIAC CATHETERIZATION      EYE SURGERY      WRIST SURGERY         Family History     Family History   Problem Relation Age of Onset    Diabetes Mother     Heart disease Father        Social History     Social History     Socioeconomic History    Marital status: Married     Spouse name: Not on file    Number of children: Not on file    Years of education: Not on file    Highest education level: Not on file   Occupational History    Not on file   Social Needs    Financial resource strain: Not on file    Food insecurity:     Worry: Not on file     Inability: Not on file     Transportation needs:     Medical: Not on file     Non-medical: Not on file   Tobacco Use    Smoking status: Former Smoker     Packs/day: 0.00    Smokeless tobacco: Never Used    Tobacco comment: quit 7 years ago    Substance and Sexual Activity    Alcohol use: Yes     Alcohol/week: 1.0 standard drinks     Types: 1 Cans of beer per week     Comment: 1 pack in 6 months.    Drug use: No    Sexual activity: Never   Lifestyle    Physical activity:     Days per week: Not on file     Minutes per session: Not on file  Stress: Not on file   Relationships    Social connections:     Talks on phone: Not on file     Gets together: Not on file     Attends religious service: Not on file     Active member of club or organization: Not on file     Attends meetings of clubs or organizations: Not on file     Relationship status: Not on file    Intimate partner violence:     Fear of current or ex partner: Not on file     Emotionally abused: Not on file     Physically abused: Not on file     Forced sexual activity: Not on file   Other Topics Concern    Not on file   Social History Narrative    Not on file       Allergies     Allergies   Allergen Reactions    Statins      Other reaction(s): Arthralgia (Joint Pain)    Lyrica [Pregabalin]      Nerve pain, out of body experiences       Home Medications     Home medications reviewed by ED MD     Previous Medications    ASPIRIN 81 MG CHEWABLE TABLET    Chew 81 mg by mouth daily    CARVEDILOL (COREG) 6.25 MG TABLET    Take 6.25 mg by mouth every 12 (twelve) hours    EVOLOCUMAB 140 MG/ML SOLUTION AUTO-INJECTOR    Inject 1 mL into the skin every 14 (fourteen) days    EVOLOCUMAB 140 MG/ML SOLUTION AUTO-INJECTOR        EZETIMIBE (ZETIA) 10 MG TABLET    Take 10 mg by mouth daily       FUROSEMIDE (LASIX) 20 MG TABLET    Take 20 mg by mouth       GLUCOSE BLOOD (ONETOUCH VERIO) TEST STRIP    Check blood sugars 4 xday    INSULIN DEGLUDEC (TRESIBA FLEXTOUCH) 200 UNIT/ML SOLUTION  PEN-INJECTOR    Inject 120 Units into the skin daily    INSULIN LISPRO (HUMALOG KWIKPEN) 100 UNIT/ML INJECTION PEN    Humalog KwikPen (U-100) Insulin 100 unit/mL subcutaneous per SSI 1-20 units premeals    INSULIN PEN NEEDLE 31G X 8 MM MISC    Use with insulin pen    ONDANSETRON (ZOFRAN-ODT) 4 MG DISINTEGRATING TABLET    Take 1 tablet (4 mg total) by mouth every 6 (six) hours as needed for Nausea    SACUBITRIL-VALSARTAN (ENTRESTO) 24-26 MG TAB PER TABLET    2 (two) times daily       SILDENAFIL (VIAGRA) 50 MG TABLET    Take 1 tablet (50 mg total) by mouth daily as needed for Erectile Dysfunction         Review of Systems     Constitutional: +Diaphoresis. No fever  Eyes: No discharge   ENT: No ST  CV:  +CP  Resp:  No SOB or cough  GI: No abd pain, N, V, D  GU: No dysuria  Skin: No rash  Neuro: +Dizziness. No HA  Psych:  No behavior changes  All other systems reviewed and negative      Physical Exam     BP 139/65    Pulse 70    Temp 98 F (36.7 C) (Oral)    Resp 18    Ht 6\' 1"  (1.854 m)    Wt 99.6 kg  SpO2 93%    BMI 28.97 kg/m     CONSTITUTIONAL Patient is afebrile, Vital signs reviewed.  HEAD Atraumatic, Normocephalic.  EYES No discharge from eyes, Sclera are normal.  NECK   Normal ROM, Cervical spine nontender  RESPIRATORY CHEST Chest is nontender, Breath sounds normal, No respiratory distress.  CARDIOVASCULAR RRR, Heart sounds normal.  ABDOMEN Abdomen is nontender, No peritoneal signs, No distension  BACK   There is no CVA tenderness, There is no tenderness to palpation  UPPER EXTREMITY No cyanosis, No edema  LOWER EXTREMITY No cyanosis, No edema  NEURO GCS is 15, No focal motor deficits, No focal sensory deficits.  SKIN Skin is warm, Skin is dry.  PSYCHIATRIC Normal affect, Normal insight      Monitors, EKG     EKG (interpreted by ED physician): NSR at rate of 69 bpm. Negative STE. Similar to previous EKG.      Orders Placed During This Encounter     Orders Placed This Encounter   Procedures    Chest AP  Portable    Basic Metabolic Panel    CBC and differential    Troponin I    GFR    PT/APTT    B-type Natriuretic Peptide    Cardiac monitoring (ED ONLY)    ECG 12 lead    Saline lock IV         ED Medications Administered     ED Medication Orders (From admission, onward)    None                Data Review     Nursing Records Reviewed and Agree: Yes  Laboratory results reviewed by ED provider: if applicable yes  Radiologic study results reviewed by ED provider:  If applicable yes      I, Blanche East, MD, personally performed the services documented. Skip Estimable is scribing for me on [place]. I reviewed and confirm the accuracy of the information in this medical record.    Tacy Dura, am serving as a Neurosurgeon to document services personally performed by Blanche East, MD, based on the provider's statements to me.     Credentials: Skip Estimable, scribe    Rendering Provider: Blanche East, MD      Diagnostic Study Results     Labs     Results     Procedure Component Value Units Date/Time    B-type Natriuretic Peptide [[id number]]  (Abnormal) Collected:  07/16/18 1506    Specimen:  Blood Updated:  07/16/18 1603     B-Natriuretic Peptide 257 pg/mL     PT/APTT [[id number]]  (Abnormal) Collected:  07/16/18 1506     Updated:  07/16/18 1549     PT 12.5 sec      PT INR 0.9     PTT 24 sec     Troponin I [[id number]]  (Abnormal) Collected:  07/16/18 1506    Specimen:  Blood Updated:  07/16/18 1543     Troponin I 0.06 ng/mL     Basic Metabolic Panel [[id number]]  (Abnormal) Collected:  07/16/18 1506    Specimen:  Blood Updated:  07/16/18 1537     Glucose 137 mg/dL      BUN 39 mg/dL      Creatinine 1.8 mg/dL      Calcium 8.8 mg/dL      Sodium 413 mEq/L      Potassium 4.8 mEq/L      Chloride 107 mEq/L      CO2 18  mEq/L      Anion Gap 12.0    GFR [[id number]] Collected:  07/16/18 1506     Updated:  07/16/18 1537     EGFR 38.8    CBC and differential [[id number]]  (Abnormal) Collected:  07/16/18 1506    Specimen:  Blood Updated:   07/16/18 1515     WBC 10.07 x10 3/uL      Hgb 14.8 g/dL      Hematocrit 78.2 %      Platelets 199 x10 3/uL      RBC 4.75 x10 6/uL      MCV 89.7 fL      MCH 31.2 pg      MCHC 34.7 g/dL      RDW 13 %      MPV 9.4 fL      Neutrophils 49.5 %      Lymphocytes Automated 23.5 %      Monocytes 10.3 %      Eosinophils Automated 15.6 %      Basophils Automated 0.9 %      Immature Granulocyte 0.2 %      Nucleated RBC 0.0 /100 WBC      Neutrophils Absolute 4.98 x10 3/uL      Abs Lymph Automated 2.37 x10 3/uL      Abs Mono Automated 1.04 x10 3/uL      Abs Eos Automated 1.57 x10 3/uL      Absolute Baso Automated 0.09 x10 3/uL      Absolute Immature Granulocyte 0.02 x10 3/uL      Absolute NRBC 0.00 x10 3/uL           Radiologic Studies  Radiology Results (24 Hour)     Procedure Component Value Units Date/Time    Chest AP Portable [[id number]] Collected:  07/16/18 1527    Order Status:  Completed Updated:  07/16/18 1535    Narrative:       EXAMINATION: Chest AP portable.    HISTORY: Chest pain.    COMPARISON: None.    FINDINGS:   Sternotomy wires and mediastinal surgical clips. Mild vascular  congestion. Right basilar interstitial opacity may represent  interstitial edema versus infiltrate. No pleural effusion or  pneumothorax. Normal heart size. Minimal calcification thoracic aorta.  Diffuse osteopenia.      Impression:          Mild vascular congestion. Bibasilar interstitial opacities may represent  edema versus infiltrate.    Adela Glimpse, MD   07/16/2018 3:31 PM      .        Procedures     na    Clinical Notes, Consults & Reevaluations, and MDM     Differential Diagnoses:   3:31 PM Early differential includes, but is not limited to: MI, AAA, pneumonia, pneumothorax, electrolyte abnormality, arrhythmia.      Consults/Reevaluation:   3:50 PM Spoke with Dr. Marcelo Baldy, [facility]. He will see pt. Does not want additional blood thinners at this time.    4:07 PM Spoke with Dr. Seward Speck, hospitalist, who accepts pt for observation  telemetry admission.    4:15 PM Pt seen in ED by Dr. Marcelo Baldy. He said OK to d/c even though troponin is elevated.    7:17 PM Re-eval:   Feeling much better, VS stable,  no acute distress, looks well.     Counseled re dx  Counseled re follow up  Answered all questions  Counseled red flags and signs and sxs to return for.  Comfortable with follow up and discharge plan    Return Precautions  The patient is aware that this evaluation is only a screening for emergent conditions related to his or her symptoms and presentation.   I discussed the need for prompt follow-up.  Patient demonstrates verbal understanding.  Patient advised to return to the ED for any worsening symptoms, uncontrolled pain if applicable, worsening fevers, or any changes in their condition prompting concern and need for repeat evaluation and/or additional management.      MDM:   7:18 PM Hx, PEx, labs, and imaging were done to evaluate pt. Specialty consultation was obtained. Pt was seen in the ER by his cardiologist, who feels that pt is well enough to go home. Repeat troponin is less than the initial troponin. Pt is asymptomatic upon departure and will f/u with cardiology.      Diagnosis and Disposition   Diagnosis/Clinical Impression:  1. Chest pain, unspecified type    2. Elevated troponin        Disposition  ED Disposition     ED Disposition Condition Date/Time Comment    Observation  Thu Jul 16, 2018  4:07 PM Admitting Physician: Imogene Burn [[id number]]   Diagnosis: Chest pain [6045409]   Estimated Length of Stay: < 2 midnights   Tentative Discharge Plan?: Home or Self Care [1]   Patient Class: Observation [104]   Bed request comments: dx: CP and elevated troponin. med tele obs.            Prescriptions    New Prescriptions    No medications on file         Critical Care     na    Signout If Applicable     na       Harden Mo, MD  07/16/18 2123

## 2018-07-16 NOTE — ED Notes (Signed)
Bed: B20  Expected date:   Expected time:   Means of arrival:   Comments:  Medic

## 2018-07-16 NOTE — ED Notes (Signed)
MD aware of vitals at D/C . Pt is to take his nightly BP meds when he gets home

## 2018-07-16 NOTE — Consults (Signed)
[facility] CONSULTATION REPORT      Date Time: 07/16/18 3:59 PM  Patient Name: [name]  Requesting Physician: Harden Mo, MD           History:   [name] is a 58 y.o. male admitted on 07/16/2018, for whom we are asked to provide cardiac consultation, regarding chest pain.    Today, this pt was sitting at his desk when he began to experience sharp left sided chest pain. The pain would last 1-2 seconds, disappear for 3 seconds and than return.  This went on for about an hr.  No clear provacative or palliative features and there were no associated complaints.      Of note is that his prior ischemic symptoms were an exertional "fatigue." He has not had any of that since his CABG    Past Medical History:     Past Medical History:   Diagnosis Date    Dehydration     Diabetes mellitus     Dupre's syndrome     Hypertension     Migraine     Neuropathy     Pancreatitis     Sleep apnea     Type 1 diabetes mellitus        Past Surgical History:     Past Surgical History:   Procedure Laterality Date    BONE GRAFT      BYPASS, CAROTID-SUBCLAVIAN      CARDIAC CATHETERIZATION      EYE SURGERY      WRIST SURGERY         Family History:     Family History   Problem Relation Age of Onset    Diabetes Mother     Heart disease Father        Social History:     Social History     Socioeconomic History    Marital status: Married     Spouse name: Not on file    Number of children: Not on file    Years of education: Not on file    Highest education level: Not on file   Occupational History    Not on file   Social Needs    Financial resource strain: Not on file    Food insecurity:     Worry: Not on file     Inability: Not on file    Transportation needs:     Medical: Not on file     Non-medical: Not on file   Tobacco Use    Smoking status: Former Smoker     Packs/day: 0.00    Smokeless tobacco: Never Used    Tobacco comment: quit 7 years ago    Substance and Sexual Activity     Alcohol use: Yes     Alcohol/week: 1.0 standard drinks     Types: 1 Cans of beer per week     Comment: 1 pack in 6 months.    Drug use: No    Sexual activity: Never   Lifestyle    Physical activity:     Days per week: Not on file     Minutes per session: Not on file    Stress: Not on file   Relationships    Social connections:     Talks on phone: Not on file     Gets together: Not on file     Attends religious service: Not on file     Active member of club or organization: Not on  file     Attends meetings of clubs or organizations: Not on file     Relationship status: Not on file    Intimate partner violence:     Fear of current or ex partner: Not on file     Emotionally abused: Not on file     Physically abused: Not on file     Forced sexual activity: Not on file   Other Topics Concern    Not on file   Social History Narrative    Not on file       Allergies:     Allergies   Allergen Reactions    Statins      Other reaction(s): Arthralgia (Joint Pain)    Lyrica [Pregabalin]      Nerve pain, out of body experiences       Medications:   (Not in a hospital admission)         No current facility-administered medications for this encounter.      Current Outpatient Medications   Medication Sig Dispense Refill    aspirin 81 MG chewable tablet Chew 81 mg by mouth daily      carvedilol (COREG) 6.25 MG tablet Take 6.25 mg by mouth every 12 (twelve) hours      Evolocumab 140 MG/ML Solution Auto-injector Inject 1 mL into the skin every 14 (fourteen) days 12 pen 3    Evolocumab 140 MG/ML Solution Auto-injector       ezetimibe (ZETIA) 10 MG tablet Take 10 mg by mouth daily         furosemide (LASIX) 20 MG tablet Take 20 mg by mouth         glucose blood (ONETOUCH VERIO) test strip Check blood sugars 4 xday 120 each 5    Insulin Degludec (TRESIBA FLEXTOUCH) 200 UNIT/ML Solution Pen-injector Inject 120 Units into the skin daily 6 pen 5    insulin lispro (HUMALOG KWIKPEN) 100 UNIT/ML injection pen Humalog KwikPen  (U-100) Insulin 100 unit/mL subcutaneous per SSI 1-20 units premeals 15 pen 1    Insulin Pen Needle 31G X 8 MM Misc Use with insulin pen 1000 each 3    ondansetron (ZOFRAN-ODT) 4 MG disintegrating tablet Take 1 tablet (4 mg total) by mouth every 6 (six) hours as needed for Nausea 12 tablet 0    sacubitril-valsartan (ENTRESTO) 24-26 MG Tab per tablet 2 (two) times daily         sildenafil (VIAGRA) 50 MG tablet Take 1 tablet (50 mg total) by mouth daily as needed for Erectile Dysfunction 30 tablet 3         Review of Systems:    Comprehensive review of systems including constitutional, eyes, ears, nose, mouth, throat, cardiovascular, GI, GU, musculoskeletal, integumentary, respiratory, neurologic, psychiatric, and endocrine is negative other than what is mentioned already in the history of present illness    Physical Exam:     Vitals:    07/16/18 1457   BP: 139/65   Pulse: 70   Resp: 18   Temp: 98 F (36.7 C)   SpO2: 93%     Temp (24hrs), Avg:98 F (36.7 C), Min:98 F (36.7 C), Max:98 F (36.7 C)      Intake and Output Summary (Last 24 hours) at Date Time      GENERAL: Patient is in no acute distress   HEENT: Conjunctiva pink, oral mucosa moist  NECK: No jugular venous distention or bruits   CARDIAC: Normal apical impulse, regular rate and rhythm, with normal  S1 and S2, no murmurs, rubs, or gallops   CHEST: Clear to auscultation bilaterally, normal respiratory effort  ABDOMEN: Non tender. No obvious hepatosplenomegaly  EXTREMITIES: No clubbing, cyanosis, or edema.  DP/PT pulses 3+ bilaterally  SKIN: No cyanosis  NEUROLOGIC: Alert and oriented to time, place and person, normal mood and affect MUSCULOSKELETAL: Normal muscle strength and tone.      Labs Reviewed:     Recent Labs   Lab 07/16/18  1506   Troponin I 0.06*                     Recent Labs   Lab 07/16/18  1506   PT 12.5*   PT INR 0.9   PTT 24     Recent Labs   Lab 07/16/18  1506   WBC 10.07*   Hgb 14.8   Hematocrit 42.6   Platelets 199     Recent Labs    Lab 07/16/18  1506   Sodium 137   Potassium 4.8   Chloride 107   CO2 18*   BUN 39*   Creatinine 1.8*   EGFR 38.8   Glucose 137*     .  Lab Results   Component Value Date    BNP 188 (H) 02/27/2018     Estimated Creatinine Clearance: 54.9 mL/min (A) (based on SCr of 1.8 mg/dL (H)).  Radiology   Radiological Procedure reviewed.      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    Renal Insufficiency     Diabetes - improved, but not ideally controlled.   Hyperlipidemia.   Peripheral vascular disease.   History of hyperkalemia on higher dose of Entresto.    Recommendations:    Unless his second Tn is significantly abnormal, he could be discharged with plans for outpt  Visit.   Would not change any of his meds for now.            Signed by: Joni Reining, M.D., [facility]      [facility]    NP Spectralink [phone] (8am-5pm)  MD Spectralink [phone] (8am-5pm)  After hours, non urgent consult line [phone]  After Hours, urgent consults [phone]

## 2018-07-16 NOTE — Progress Notes (Signed)
CGM Download Visit:    [name] was referred byDr.Thukuntla, [name]  for Dexcom CGM trial. They are here today to download sensor data.     Period of trial: 07/06/2018 to 07/13/2018    Findings:   Last Hb A1c 9.7%, January,2022 (month/year)   BG control: 34% in target per CGM data.   BG patterns:   Highs: 200-300; possible factors: need more insulin.    Discussion and education review:   Injection site rotation for insulin for effective action of the dose.     Plan:    A Insulin pump assessment  visit is scheduled on 07/24/2018.   The patient is scheduled for an interpretation visit with the provider on 08/03/2018.    The patient's provider has been notified that records were scanned into the patient's chart.      [name], BSN,CDE  Diabetes Nurse Educator.

## 2018-07-17 ENCOUNTER — Telehealth (INDEPENDENT_AMBULATORY_CARE_PROVIDER_SITE_OTHER): Payer: Self-pay

## 2018-07-17 NOTE — Telephone Encounter (Signed)
Hospital D/C Template    Chart Review:     Type of Encounter - Observation  Facility:  [name]  Discharge Date:  07/16/18  Primary Discharge Dx:  Chest pain  Prior ER Visits/Hospitalizations (past year):  ER x 2, hosp admit x 1  Follow Up Appt with PCP/Specialist: needs FUV with Cardiology and PCP scheduled, pt prefers to call next week to schedule  Home Care/Home Health Ordered?  no      Patient Interview:    ? I heard you went to the hospital for chest pain, what happened? "had some sudden, on and off, but repeated episodes of sharp left sided chest pain"  o How long were you in the hospital?  One day  o What type of teaching did you receive regarding your symptoms/diagnosis?  "not sure the cause, had many tests that did not show a new cardiac issue but am to followup as outpt with the cardiologist"    o What symptoms do you have now, if any?  Pt states he feels fine, no further chest pain, no sob, no lightheadedness or dizziness. Pt states he is busy back at work and normal routines  ? Tell me about  your current medications and any changes the hospital made: resumed routine meds, no changes made  o If the new medication is an injection: n/a    ? What appointments have you made for follow-up? Pt states he prefers to  call next week to schedule both a cardiology FUV and also a PCP FUV - needs to coordinate with both work schedule and multiple Endocrine FUV he has coming up in next few weeks- plans to followup with PCP due to some abnormal lab results noted this stay  o What transportation options do you have to make it to your appointments?  Drives self  o What assistance, if any, is available to you at home?  Lives with spouse, is indep in self care and mobility      Thank you for taking my call today! If there is anything that you need, please give your office a call at [place], [phone].       *Instructed patient to call back if symptoms change or worsen and/or go to the emergency room or call  [phone] for emergency symptoms*

## 2018-07-18 LAB — ECG 12-LEAD
Atrial Rate: 69 {beats}/min
P Axis: 55 degrees
P-R Interval: 150 ms
Q-T Interval: 404 ms
QRS Duration: 102 ms
QTC Calculation (Bezet): 432 ms
R Axis: 16 degrees
T Axis: 141 degrees
Ventricular Rate: 69 {beats}/min

## 2018-07-20 ENCOUNTER — Encounter (INDEPENDENT_AMBULATORY_CARE_PROVIDER_SITE_OTHER): Payer: Self-pay | Admitting: Student in an Organized Health Care Education/Training Program

## 2018-07-22 ENCOUNTER — Telehealth (INDEPENDENT_AMBULATORY_CARE_PROVIDER_SITE_OTHER): Payer: Self-pay | Admitting: Student in an Organized Health Care Education/Training Program

## 2018-07-22 NOTE — Telephone Encounter (Signed)
Faxed through Epic receive confirmation went through

## 2018-07-22 NOTE — Telephone Encounter (Signed)
[name] from the [facility] group called requesting last progress notes, labs and imaging reports to be faxed over.    [phone]  Fax# [phone]

## 2018-07-24 ENCOUNTER — Encounter (HOSPITAL_BASED_OUTPATIENT_CLINIC_OR_DEPARTMENT_OTHER): Payer: Self-pay

## 2018-07-24 ENCOUNTER — Ambulatory Visit (INDEPENDENT_AMBULATORY_CARE_PROVIDER_SITE_OTHER): Payer: No Typology Code available for payment source

## 2018-07-24 DIAGNOSIS — E1065 Type 1 diabetes mellitus with hyperglycemia: Secondary | ICD-10-CM

## 2018-07-24 NOTE — Progress Notes (Signed)
[facility]  INSULIN PUMP ASSESSMENT:    Referral: [name] is 58 y.o.male was referred by Renaee Munda, MD, for the [place] Insulin Pump Program. He is present today for a Pump Assessment Visit.    Assessment form completed and scanned into media section of chart: Yes (Y/N)  Five days appropriately completed Pump Records provided: Yes (Y/N); explain: patient was not required to put in some information due to CGM data during period of record keeping.    The patient and family report the following current diabetes care regimen:    Current Insulin Regimen: Tresiba 120 units daily (basal); Humalog (bolus)     Avg TDD: 168 units (about 70% basal)     Insulin:Carb Ratios:      Breakfast 8   Lunch 8   Dinner 8   Other n/a   Correction Factor (Sensitivity) 15     BG Targets:                   AM 15                  PM 15                  Nighttime 15     Insulin Action: 4 hours         The following diabetes education topics were reviewed and discussed:     Overview of Insulin Pump Therapy:  CGM integration; various pumps linked to different CGM's   Dietary skills:   Counting carbs   Meal composition/balance   Use of references and tools (labels, apps, internet, measuring tools, scales)   Eating out or away from home (estimating carbs and portions)   Safety Skills   Hypoglycemia symptoms, treatment   Hyperglycemia, signs/symptoms, treatment  Monitoring:  SMBG frequency  Blood glucose target ranges   Checking ketones    Comments: Patient was in the ED last week due to dizziness and pain in his left side. During that visit it was determined that he has CKD, Stage 3 (GFR 38.8). Lipids were checked and TG were 335 (very high).    Plan: patient will discuss pump options with his provider and call to schedule saline trial when ready. He may want to use the Dexcom CGM for awhile before considering adding a pump.

## 2018-07-28 ENCOUNTER — Ambulatory Visit (INDEPENDENT_AMBULATORY_CARE_PROVIDER_SITE_OTHER): Payer: Self-pay | Admitting: Adult Health

## 2018-07-29 ENCOUNTER — Other Ambulatory Visit (INDEPENDENT_AMBULATORY_CARE_PROVIDER_SITE_OTHER): Payer: Self-pay

## 2018-07-29 NOTE — Progress Notes (Signed)
Called patient's insurance, [employer] provider line at [phone] to inquire about eligibility and coverage for CPAP machine. Provided CPT code [phone] for representative. She stated that it is covered, with 80% after deductible met, with repair once every 3 years. Patient has met his $2,000 deductible for 2020 and has also met his out of pocket maximum for 2020, so it would be covered 100% because of this.    Called patient to follow up, but no answer at home phone, so I left a voice mail message for him to call back. Also called mobile number, but someone answered and stated "No speak English," then hung up.

## 2018-07-30 ENCOUNTER — Encounter (HOSPITAL_BASED_OUTPATIENT_CLINIC_OR_DEPARTMENT_OTHER): Payer: No Typology Code available for payment source

## 2018-07-31 ENCOUNTER — Encounter (INDEPENDENT_AMBULATORY_CARE_PROVIDER_SITE_OTHER): Payer: Self-pay

## 2018-07-31 ENCOUNTER — Ambulatory Visit (INDEPENDENT_AMBULATORY_CARE_PROVIDER_SITE_OTHER)
Payer: No Typology Code available for payment source | Admitting: Student in an Organized Health Care Education/Training Program

## 2018-07-31 VITALS — BP 118/74 | HR 79 | Temp 97.3°F | Resp 12 | Ht 74.0 in | Wt 217.6 lb

## 2018-07-31 DIAGNOSIS — E1142 Type 2 diabetes mellitus with diabetic polyneuropathy: Secondary | ICD-10-CM

## 2018-07-31 DIAGNOSIS — N183 Chronic kidney disease, stage 3 unspecified: Secondary | ICD-10-CM

## 2018-07-31 DIAGNOSIS — R2689 Other abnormalities of gait and mobility: Secondary | ICD-10-CM

## 2018-07-31 DIAGNOSIS — R278 Other lack of coordination: Secondary | ICD-10-CM

## 2018-07-31 NOTE — Progress Notes (Signed)
Chief Complaint   Patient presents with    Letter for School/Work     need letter she can work from work     Diabetes Follow-up    Back Pain     Upper left side he went to ER disgnosis kidney disease here for FU          [name] is a 58 y.o. male Hx CAD seeing cardiology , poorly controlled IDDM , seeing endocrin , severe polyneuropathy, thought to be diabetic, seen by neurology, tried all neuropathic meds with poor tol/response , here to discuss his recent ER visit regarding L sided chest pain that is now resolved, cleared by cardiology, his GFR has been declining , has never seen real , he is in the process of seeing nephrology , Hx HLD, poor tol to statin, now on Repatha , Zetia     He  has sensory loss throughout most of his body. This is worst in the legs where there has been a clear length-dependent progression but he feels he has a similar symptom throughout most of his body. He has trouble with things like opening packages or buttoning buttons because of sensory loss in his hands. He has some tingling in the 5th finger on the left that can be painful at times.    He also feels like his legs have "stopped working." This occurs after walking for a short distance, after which he has to stop to rest because of weakness and pain. He walks with a cane to help with balance. He tried PT w/o help     Has tried TCA, cymbalta, lyrica, gabapentin w/o help     He needs a note to get a scooter , a note to telework             Vitals:    07/31/18 1325   BP: 118/74   Pulse: 79   Resp: 12   Temp: 97.3 F (36.3 C)   SpO2: 98%   Weight: 98.7 kg (217 lb 9.6 oz)   Height: 1.88 m (6\' 2" )       Patient Active Problem List   Diagnosis    Coronary atherosclerosis    Type 2 diabetes mellitus with diabetic polyneuropathy, with long-term current use of insulin    Statin-induced myositis    Diabetic peripheral neuropathy    Other hyperlipidemia    History of pancreatitis    Cellulitis    Open wound of right foot     Essential hypertension    Chest pain       Past Medical History:   Diagnosis Date    CKD (chronic kidney disease)     Dehydration     Diabetes mellitus     Dupre's syndrome     Hypertension     Migraine     Neuropathy     Pancreatitis     Sleep apnea     Type 1 diabetes mellitus        Past Surgical History:   Procedure Laterality Date    BONE GRAFT      BYPASS, CAROTID-SUBCLAVIAN      CARDIAC CATHETERIZATION      EYE SURGERY      WRIST SURGERY         Social History     Socioeconomic History    Marital status: Married     Spouse name: Not on file    Number of children: Not on file    Years of education:  Not on file    Highest education level: Not on file   Occupational History    Not on file   Social Needs    Financial resource strain: Not on file    Food insecurity:     Worry: Not on file     Inability: Not on file    Transportation needs:     Medical: Not on file     Non-medical: Not on file   Tobacco Use    Smoking status: Former Smoker     Packs/day: 0.00    Smokeless tobacco: Never Used    Tobacco comment: quit 7 years ago    Substance and Sexual Activity    Alcohol use: Yes     Alcohol/week: 1.0 standard drinks     Types: 1 Cans of beer per week     Comment: 1 pack in 6 months.    Drug use: No    Sexual activity: Never   Lifestyle    Physical activity:     Days per week: Not on file     Minutes per session: Not on file    Stress: Not on file   Relationships    Social connections:     Talks on phone: Not on file     Gets together: Not on file     Attends religious service: Not on file     Active member of club or organization: Not on file     Attends meetings of clubs or organizations: Not on file     Relationship status: Not on file    Intimate partner violence:     Fear of current or ex partner: Not on file     Emotionally abused: Not on file     Physically abused: Not on file     Forced sexual activity: Not on file   Other Topics Concern    Not on file   Social History  Narrative    Not on file       Family History   Problem Relation Age of Onset    Diabetes Mother     Heart disease Father        Allergies   Allergen Reactions    Statins      Other reaction(s): Arthralgia (Joint Pain)    Lyrica [Pregabalin]      Nerve pain, out of body experiences       Current Outpatient Medications   Medication Sig Dispense Refill    aspirin 81 MG chewable tablet Chew 81 mg by mouth daily      carvedilol (COREG) 6.25 MG tablet Take 6.25 mg by mouth every 12 (twelve) hours      Evolocumab 140 MG/ML Solution Auto-injector Inject 1 mL into the skin every 14 (fourteen) days 12 pen 3    Evolocumab 140 MG/ML Solution Auto-injector       ezetimibe (ZETIA) 10 MG tablet Take 10 mg by mouth daily         furosemide (LASIX) 20 MG tablet Take 20 mg by mouth         Insulin Degludec (TRESIBA FLEXTOUCH) 200 UNIT/ML Solution Pen-injector Inject 120 Units into the skin daily 6 pen 5    insulin lispro (HUMALOG KWIKPEN) 100 UNIT/ML injection pen Humalog KwikPen (U-100) Insulin 100 unit/mL subcutaneous per SSI 1-20 units premeals 15 pen 1    Insulin Pen Needle 31G X 8 MM Misc Use with insulin pen 1000 each 3    sacubitril-valsartan (ENTRESTO) 24-26  MG Tab per tablet 2 (two) times daily         sildenafil (VIAGRA) 50 MG tablet Take 1 tablet (50 mg total) by mouth daily as needed for Erectile Dysfunction 30 tablet 3    glucose blood (ONETOUCH VERIO) test strip Check blood sugars 4 xday 120 each 5    ondansetron (ZOFRAN-ODT) 4 MG disintegrating tablet Take 1 tablet (4 mg total) by mouth every 6 (six) hours as needed for Nausea 12 tablet 0     No current facility-administered medications for this visit.        Constitutional: + fatigue, no fever, chills   Eyes: negative for vision problems   Ears, nose, mouth, throat, and face: negative for nose bleeds, nasal congestion   Respiratory: negative for SOB, cough , wheezing   Cardiovascular: negative for CP, dizziness, palpitations, swelling , orthopnea    Gastrointestinal: negative for diarrhea , nausea, vomiting, constipation   Genitourinary:negative for dysuria, frequency and hematuria   Integument/breast: negative for nodule   Hematologic/lymphatic: negative for enlarged LAD,   Neurological: diffuse sensory loss, poor balance   Behavioral/Psych: negative for anxiety, depression    Other ROS were reviewed and were neg         Vitals:    07/31/18 1325   BP: 118/74   Pulse: 79   Resp: 12   Temp: 97.3 F (36.3 C)   SpO2: 98%       CONSTITUTIONAL Patient is afebrile, Vital signs reviewed.   HEAD Atraumatic, Normocephallc.   EYES Eyes are normal to inspection, PERRL, No discharge from eyes,   ENT Ears normal to inspection, Nose examination normal, Posterior pharynx normal.   NECK Normal ROM, No jugular venous distention, No meningeal signs.   RESPIRATORY CHEST Chest is nontender, Breath sounds normal.   CARDIOVASCULAR RRR, Heart sounds normal, Normal S1 S2.      PSYCHIATRIC Oriented X 3, Normal affect. Normal insight.      A/P    1. CKD (chronic kidney disease) stage 3, GFR 30-59 ml/min  US Renal Kidney Bladder Complete    Scooter (DME)   2. Diabetic peripheral neuropathy  Scooter (DME)   3. Poor balance  Scooter (DME)   4. Sensory ataxia  Scooter (DME)         Medical notes x 2 written       Patient counseled to watch concentrated sweets and carbohydrates. Patient also counseled to exercise at least three times a week. Patient counseled about routine foot care  Patient counseled to eat a low fat, low cholesterol diet and to exercise at least three times a week for at least 30 minutes.  Patient counseled to eat a low salt diet(DASH diet)  Followup with renal, endocrin, neurology , cardiology

## 2018-07-31 NOTE — Progress Notes (Signed)
Have you seen any specialists/other providers since your last visit with [place]?    No    Arm preference verified?   Yes    The patient is due for eye exam, shingles vaccine and HEP C, [facility]

## 2018-08-01 ENCOUNTER — Encounter (INDEPENDENT_AMBULATORY_CARE_PROVIDER_SITE_OTHER): Payer: Self-pay | Admitting: Student in an Organized Health Care Education/Training Program

## 2018-08-02 ENCOUNTER — Encounter (INDEPENDENT_AMBULATORY_CARE_PROVIDER_SITE_OTHER): Payer: Self-pay

## 2018-08-03 ENCOUNTER — Encounter (INDEPENDENT_AMBULATORY_CARE_PROVIDER_SITE_OTHER): Payer: Self-pay

## 2018-08-03 ENCOUNTER — Ambulatory Visit (INDEPENDENT_AMBULATORY_CARE_PROVIDER_SITE_OTHER): Payer: No Typology Code available for payment source | Admitting: Internal Medicine

## 2018-08-03 ENCOUNTER — Encounter: Payer: Self-pay | Admitting: Internal Medicine

## 2018-08-03 VITALS — BP 148/73 | HR 82 | Ht 74.0 in | Wt 212.6 lb

## 2018-08-03 DIAGNOSIS — R635 Abnormal weight gain: Secondary | ICD-10-CM

## 2018-08-03 DIAGNOSIS — E1065 Type 1 diabetes mellitus with hyperglycemia: Secondary | ICD-10-CM

## 2018-08-03 LAB — POCT GLUCOSE: Whole Blood Glucose POCT: 126 mg/dL — AB (ref 70–100)

## 2018-08-03 MED ORDER — FREESTYLE LANCETS MISC
1 refills | Status: AC
Start: 2018-08-03 — End: 2019-08-03

## 2018-08-03 MED ORDER — HUMALOG KWIKPEN 100 UNIT/ML SC SOPN
15.00 [IU] | PEN_INJECTOR | Freq: Three times a day (TID) | SUBCUTANEOUS | 1 refills | Status: AC
Start: 2018-08-03 — End: 2019-08-03

## 2018-08-03 MED ORDER — DEXCOM G6 RECEIVER DEVI
1.00 | Freq: Every day | 0 refills | Status: AC
Start: 2018-08-03 — End: ?

## 2018-08-03 MED ORDER — DEXCOM G6 SENSOR MISC
1.00 | 1 refills | Status: AC
Start: 2018-08-03 — End: ?

## 2018-08-03 MED ORDER — DEXCOM G6 TRANSMITTER MISC
1.00 | Freq: Every day | 1 refills | Status: AC
Start: 2018-08-03 — End: ?

## 2018-08-03 NOTE — Patient Instructions (Signed)
Increase Tresiba to 130 units once daily.    Take Humalog 1 unit for every 5 grams of carbohydrates (or 10 units for every meal) + additional insulin for correction of 1 unit for every 15 above 120.    Have 24 hour collection of urine for cortisol.

## 2018-08-03 NOTE — Progress Notes (Signed)
[name] is a 58 y.o. male who presents today for a f/u visit for type 1 diabetes.  PCP: Bouhouch, Jacqualyn Posey, MD  Patient is referred by: PCP      ##DM:  The patient's diagnosis of diabetes mellitus was made when he was 58yrs old, was initially told he was type 1 diabetic, but later was told he was probably type 2 diabetic 6 years back, but has always been managed on insulin.  He was diagnosed at age 28yrs, when he had polyuria, polydipsia, had BG >1000, was in the hospital. Never had DKA.  He states he has never been off of insulin for more than a day since his diagnosis. Family h/o DM2 in his mother.  C peptide was 1.3 in 09/19.  Negative GAD abs.    Patient is currently on:   Tresiba 120u d  Humalog based on portions of carbs and SS (1 unit for every 15 above 120). (1:6-1:8 carb ratio and states depending on what kinds of carbs)    Medications not tolerated in past or contraindications: Metformin    Medication dosage review with [name] suggested compliance most of the time.    Pt had h/o pancreatitis 2 yrs back.   Did not tolerate Metformin XR due to diarrhea.    Pt did not want any sensors/ insulin pumps in the past, but he would like to get on closed loop now, first would like to get on Dexcom.      He had Dexcom for a week in 2/20.    Patient returns to discuss the results of his/her recent continuous glucose monitor (CGM) results.  The CGM was performed between 07/06/18 and 07/12/18  A target blood glucose (BG) between 80 and 180 was defined  34% of patient's BG readings were within target  66% of BG readings were above target  0% of BG readings were below target  AVG BG 208    No severe hypoglycemia occurred    CGM glucose pattern showed: global hyperglycemia      Checking BGS 4xday    Diet:  Semicompliant with diet, eats 3 meals/d  Prior formal diabetes education/ nutritional counseling has been performed    Exercise:  Limited from neuropathy      Diabetic complications:   Cardiovascular: CAD s/p  CABG 2018, PCI 2013  Neuropathy: Yes. Last foot exam was 10/19 done here , followed with wound care for rt foot ulcer and has healed well.  Retinopathy: Yes, h/o laser. Last eye exam was 12/18, advised f/u  Renal: Yes, + proteinuria 09/19  Yes ACE-I/ARB  No Statin, did not tolerate due to jt pains, on Zetia, PCSK9i  BP: not at goal    HLP:  On Zetia, on Evolocumab since 12/19 prescribed by his cardiologist.    Past Medical History:   Diagnosis Date    CKD (chronic kidney disease)     Dehydration     Diabetes mellitus     Dupre's syndrome     Hypertension     Migraine     Neuropathy     Pancreatitis     Sleep apnea     Type 1 diabetes mellitus        Past Surgical History:   Procedure Laterality Date    BONE GRAFT      BYPASS, CAROTID-SUBCLAVIAN      CARDIAC CATHETERIZATION      EYE SURGERY      WRIST SURGERY       Family  History   Problem Relation Age of Onset    Diabetes Mother     Heart disease Father        Allergies   Allergen Reactions    Statins      Other reaction(s): Arthralgia (Joint Pain)    Lyrica [Pregabalin]      Nerve pain, out of body experiences     Social History     Socioeconomic History    Marital status: Married     Spouse name: Not on file    Number of children: Not on file    Years of education: Not on file    Highest education level: Not on file   Occupational History    Not on file   Social Needs    Financial resource strain: Not on file    Food insecurity:     Worry: Not on file     Inability: Not on file    Transportation needs:     Medical: Not on file     Non-medical: Not on file   Tobacco Use    Smoking status: Former Smoker     Packs/day: 0.00    Smokeless tobacco: Never Used    Tobacco comment: quit 7 years ago    Substance and Sexual Activity    Alcohol use: Yes     Alcohol/week: 1.0 standard drinks     Types: 1 Cans of beer per week     Comment: 1 pack in 6 months.    Drug use: No    Sexual activity: Never   Lifestyle    Physical activity:     Days  per week: Not on file     Minutes per session: Not on file    Stress: Not on file   Relationships    Social connections:     Talks on phone: Not on file     Gets together: Not on file     Attends religious service: Not on file     Active member of club or organization: Not on file     Attends meetings of clubs or organizations: Not on file     Relationship status: Not on file    Intimate partner violence:     Fear of current or ex partner: Not on file     Emotionally abused: Not on file     Physically abused: Not on file     Forced sexual activity: Not on file   Other Topics Concern    Not on file   Social History Narrative    Not on file     Current Outpatient Medications on File Prior to Visit   Medication Sig Dispense Refill    aspirin 81 MG chewable tablet Chew 81 mg by mouth daily      carvedilol (COREG) 6.25 MG tablet Take 6.25 mg by mouth every 12 (twelve) hours      Evolocumab 140 MG/ML Solution Auto-injector Inject 1 mL into the skin every 14 (fourteen) days 12 pen 3    Evolocumab 140 MG/ML Solution Auto-injector       ezetimibe (ZETIA) 10 MG tablet Take 10 mg by mouth daily         furosemide (LASIX) 20 MG tablet Take 20 mg by mouth         glucose blood (ONETOUCH VERIO) test strip Check blood sugars 4 xday 120 each 5    Insulin Degludec (TRESIBA FLEXTOUCH) 200 UNIT/ML Solution Pen-injector Inject 120 Units into the  skin daily 6 pen 5    insulin lispro (HUMALOG KWIKPEN) 100 UNIT/ML injection pen Humalog KwikPen (U-100) Insulin 100 unit/mL subcutaneous per SSI 1-20 units premeals 15 pen 1    Insulin Pen Needle 31G X 8 MM Misc Use with insulin pen 1000 each 3    ondansetron (ZOFRAN-ODT) 4 MG disintegrating tablet Take 1 tablet (4 mg total) by mouth every 6 (six) hours as needed for Nausea 12 tablet 0    sacubitril-valsartan (ENTRESTO) 24-26 MG Tab per tablet 2 (two) times daily         sildenafil (VIAGRA) 50 MG tablet Take 1 tablet (50 mg total) by mouth daily as needed for Erectile  Dysfunction 30 tablet 3     No current facility-administered medications on file prior to visit.          Review of Systems  Constitutional: Negative for fever.   Respiratory: Negative for shortness of breath  Cardiovascular: Negative for chest pain.  Gastrointestinal: Negative for vomiting and abdominal pain.        Objective:     Vitals:    08/03/18 1358   BP: 148/73   BP Site: Right arm   Patient Position: Sitting   Cuff Size: Medium   Pulse: 82   SpO2: 96%   Weight: 96.4 kg (212 lb 9.6 oz)   Height: 1.88 m (6\' 2" )      General appearance - alert, well appearing, and in no distress  Mental status - alert, oriented to person, place, and time, normal mood, behavior, speech, dress, motor activity, and thought processes  HEENT- normocephalic, atraumatic, anicteric sclerae, EOMI, mucous membranes moist  Heart regular rate rhythm  Lungs clear b/l  Neurological - alert, oriented, normal speech, no focal findings or movement disorder noted  Musculoskeletal - no joint tenderness, deformity or swelling  Skin - normal color and turgor, no rashes      Lab Review      Lab Results   Component Value Date    WBC 10.07 (H) 07/16/2018    HGB 14.8 07/16/2018    HCT 42.6 07/16/2018    PLT 199 07/16/2018    CHOL 182 07/16/2018    TRIG 335 (H) 07/16/2018    HDL 51 07/16/2018    LDL 64 07/16/2018    ALT 19 02/13/2018    AST 19 02/13/2018    NA 137 07/16/2018    K 4.8 07/16/2018    CL 107 07/16/2018    CREAT 1.8 (H) 07/16/2018    BUN 39 (H) 07/16/2018    CO2 18 (L) 07/16/2018    TSH 1.50 03/17/2018    INR 0.9 07/16/2018    GLU 137 (H) 07/16/2018    HGBA1C 9.7 (A) 06/01/2018         Assessment:   [name] is a 58 y.o. -year-old male who presents today for a f/u visit for type 1 diabetes, under poor control.     1. Uncontrolled type 1 diabetes mellitus with hyperglycemia  POCT Glucose    Cortisol, Free, 24 Hr Urine.    Continuous Blood Gluc Receiver (DEXCOM G6 RECEIVER) Device    Continuous Blood Gluc Sensor (DEXCOM G6 SENSOR) Misc     Continuous Blood Gluc Transmit (DEXCOM G6 TRANSMITTER) Misc    HUMALOG KWIKPEN 100 UNIT/ML Solution Pen-injector injection pen    Lancets (FREESTYLE) lancets   2. Weight gain  Cortisol, Free, 24 Hr Urine.            Plan:   ##  DM:  Neuropathy  CAD s/p CABG  Retinopathy  CKD   A1c was 9.7 1/20 which is above goal. Goal for this patient is 7.    HE is requiring ~150u/d with BGS still in 200s, pt reports compliance. Was started on Metformin XR but could not tolerate.  Not a candidate for GLP!a/ DPP4i/ SGLT2i due to h/o pancreatitis.    Increase Tresiba to 130 units once daily.    Take Humalog 1 unit for every 5 grams of carbohydrates (or 10 units for every meal) + additional insulin for correction of 1 unit for every 15 above 120.    Weight gain:  Also requiring high doses of insulin.  Check urine cortisol.    HTN:  BP in clinic was not at goal. Recommend that patient monitors BP at home regularly, and discuss with PCP if running high.          Renaee Munda, MD, [facility]  [address]  [place], [place] [address]  Tel: [phone]  Fax: [phone]

## 2018-08-06 ENCOUNTER — Other Ambulatory Visit (FREE_STANDING_LABORATORY_FACILITY): Payer: No Typology Code available for payment source

## 2018-08-06 DIAGNOSIS — I255 Ischemic cardiomyopathy: Secondary | ICD-10-CM

## 2018-08-06 LAB — BASIC METABOLIC PANEL
BUN: 30 mg/dL — ABNORMAL HIGH (ref 9.0–28.0)
CO2: 23 mEq/L (ref 21–29)
Calcium: 8.6 mg/dL (ref 8.5–10.5)
Chloride: 104 mEq/L (ref 100–111)
Creatinine: 1.6 mg/dL — ABNORMAL HIGH (ref 0.5–1.5)
Glucose: 327 mg/dL — ABNORMAL HIGH (ref 70–100)
Potassium: 5 mEq/L (ref 3.5–5.1)
Sodium: 136 mEq/L (ref 136–145)

## 2018-08-06 LAB — HEMOLYSIS INDEX: Hemolysis Index: 8 (ref 0–18)

## 2018-08-06 LAB — GFR: EGFR: 44.4

## 2018-08-06 LAB — B-TYPE NATRIURETIC PEPTIDE: B-Natriuretic Peptide: 170 pg/mL — ABNORMAL HIGH (ref 0–100)

## 2018-08-10 ENCOUNTER — Ambulatory Visit: Payer: No Typology Code available for payment source

## 2018-08-18 ENCOUNTER — Encounter (INDEPENDENT_AMBULATORY_CARE_PROVIDER_SITE_OTHER): Payer: Self-pay

## 2018-08-31 ENCOUNTER — Ambulatory Visit (INDEPENDENT_AMBULATORY_CARE_PROVIDER_SITE_OTHER): Payer: Self-pay | Admitting: Adult Health

## 2018-09-01 ENCOUNTER — Encounter (INDEPENDENT_AMBULATORY_CARE_PROVIDER_SITE_OTHER): Payer: Self-pay

## 2018-09-02 ENCOUNTER — Encounter (INDEPENDENT_AMBULATORY_CARE_PROVIDER_SITE_OTHER): Payer: Self-pay

## 2018-09-03 ENCOUNTER — Encounter (INDEPENDENT_AMBULATORY_CARE_PROVIDER_SITE_OTHER): Payer: Self-pay

## 2018-09-04 ENCOUNTER — Telehealth: Payer: Self-pay | Admitting: Student in an Organized Health Care Education/Training Program

## 2018-09-04 NOTE — Telephone Encounter (Signed)
Please advise 

## 2018-09-04 NOTE — Telephone Encounter (Signed)
Pt called requesting a diagnostic code for the scooter that Dr.Bouhouch prescribed for the pt in order for insurance to approve.    Pt can best be reached at [phone].    Please advise, thank you.

## 2018-09-04 NOTE — Telephone Encounter (Signed)
Diabetic polyneuropathy E11.42  Poor balance R26.89    Thanks for informing     [employer] Shandricka Monroy

## 2018-09-04 NOTE — Telephone Encounter (Signed)
Pt informed

## 2018-09-07 ENCOUNTER — Telehealth: Payer: Self-pay | Admitting: Internal Medicine

## 2018-09-07 DIAGNOSIS — E1065 Type 1 diabetes mellitus with hyperglycemia: Secondary | ICD-10-CM

## 2018-09-07 MED ORDER — INSULIN DEGLUDEC 200 UNIT/ML SC SOPN
130.00 [IU] | PEN_INJECTOR | Freq: Every day | SUBCUTANEOUS | 2 refills | Status: DC
Start: 2018-09-07 — End: 2018-12-27

## 2018-09-07 NOTE — Telephone Encounter (Signed)
-----   Message from Randa Ngo II sent at 09/07/2018 11:11 AM EDT -----  Regarding: Rx refill  Dr. Nicky Pugh,    Patient is requesting a 90 day supply of Tresiba with refills. Please send to [facility] [address] - [place], [place] - [address] on file. Patient last seen on 08/03/18.    Thank you.

## 2018-09-07 NOTE — Telephone Encounter (Signed)
Rx sent 

## 2018-09-16 ENCOUNTER — Encounter (INDEPENDENT_AMBULATORY_CARE_PROVIDER_SITE_OTHER): Payer: Self-pay | Admitting: Student in an Organized Health Care Education/Training Program

## 2018-09-17 ENCOUNTER — Encounter (INDEPENDENT_AMBULATORY_CARE_PROVIDER_SITE_OTHER): Payer: Self-pay

## 2018-10-02 ENCOUNTER — Encounter (INDEPENDENT_AMBULATORY_CARE_PROVIDER_SITE_OTHER): Payer: Self-pay

## 2018-10-05 ENCOUNTER — Telehealth: Payer: Self-pay | Admitting: Student in an Organized Health Care Education/Training Program

## 2018-10-05 ENCOUNTER — Encounter (INDEPENDENT_AMBULATORY_CARE_PROVIDER_SITE_OTHER): Payer: Self-pay

## 2018-10-05 NOTE — Telephone Encounter (Signed)
[employer] (April) is calling to gather certain clinical information regarding the pt. April wants a call back for further details. Please advice, thank you!    April  CLINICAL COORDINATOR FOR [employer]  TEL: [phone] EXT [phone]  FAX: [phone]

## 2018-10-06 NOTE — Telephone Encounter (Signed)
I faxed all clinical requirement 09/17/2018 to other case coordinator. I receive call from different case manager today I faxed to her also her name Marcha Solders faxed April also all faxed went through

## 2018-10-07 ENCOUNTER — Ambulatory Visit (INDEPENDENT_AMBULATORY_CARE_PROVIDER_SITE_OTHER): Payer: Self-pay | Admitting: Cardiology

## 2018-10-08 ENCOUNTER — Encounter (INDEPENDENT_AMBULATORY_CARE_PROVIDER_SITE_OTHER): Payer: Self-pay

## 2018-10-23 ENCOUNTER — Encounter (INDEPENDENT_AMBULATORY_CARE_PROVIDER_SITE_OTHER): Payer: Self-pay

## 2018-10-28 ENCOUNTER — Other Ambulatory Visit (INDEPENDENT_AMBULATORY_CARE_PROVIDER_SITE_OTHER): Payer: Self-pay

## 2018-10-30 ENCOUNTER — Telehealth (INDEPENDENT_AMBULATORY_CARE_PROVIDER_SITE_OTHER): Payer: No Typology Code available for payment source | Admitting: Internal Medicine

## 2018-10-30 DIAGNOSIS — Z794 Long term (current) use of insulin: Secondary | ICD-10-CM

## 2018-10-30 DIAGNOSIS — E1165 Type 2 diabetes mellitus with hyperglycemia: Secondary | ICD-10-CM

## 2018-10-30 NOTE — Progress Notes (Signed)
TELEMEDICINE VISIT              CLINICAL SUMMARY        Verbal Consent      Verbal consent has been obtained from the patient to conduct a telephone visit encounter to minimize exposure to COVID-19: yes      Chief Complaint:      DM2 f/u    Problem List, Medications, and Allergies reviewed: yes      HPI:  HPI  DM2 f/u:  A1c was 9.7 in 1/20.  HE doe snot want to get any labs done at this time due to fear of COVID. He will get labs done when he sees his cardiologist.    BGs fasting usually >200, during the in 100-200 most of th etime.  Denies hypoglycemia s<80.    On Tresiba 130u d, Humalog 1 units for every 5 g of carbs or 10-20 units before meals on avg.    He had urine cortisol ordered LOV due to wt gain and high insulin doses, hadn't done it due to COVID.    Not much exercise.  Semicompliant with diet.      Assessment/Plan:    DM2  Increase Tresiba to 140units daily.  Continue Humalog 1:5 on IC ratio or 10-20 units before meals.  Have labs and urine cortisol.      F/u 3 months    Time spent in medical discussion: 5 to 10 minutes    Renaee Munda, MD

## 2018-11-02 ENCOUNTER — Encounter (INDEPENDENT_AMBULATORY_CARE_PROVIDER_SITE_OTHER): Payer: Self-pay

## 2018-11-03 ENCOUNTER — Encounter (INDEPENDENT_AMBULATORY_CARE_PROVIDER_SITE_OTHER): Payer: Self-pay

## 2018-11-10 ENCOUNTER — Encounter (INDEPENDENT_AMBULATORY_CARE_PROVIDER_SITE_OTHER): Payer: Self-pay

## 2018-12-02 ENCOUNTER — Encounter (INDEPENDENT_AMBULATORY_CARE_PROVIDER_SITE_OTHER): Payer: Self-pay

## 2018-12-03 ENCOUNTER — Encounter (INDEPENDENT_AMBULATORY_CARE_PROVIDER_SITE_OTHER): Payer: Self-pay

## 2018-12-24 ENCOUNTER — Other Ambulatory Visit (INDEPENDENT_AMBULATORY_CARE_PROVIDER_SITE_OTHER): Payer: Self-pay

## 2018-12-24 NOTE — Progress Notes (Signed)
Called patient to follow up since last outreach. Discussed coverage information for CPAP machine as we discussed last outreach. Patient stated he really doesn't need CPAP anymore, doesn't have any apnea during sleep or trouble currently and was told by his physician he doesn't need to use it if he doesn't have symptoms. Reviewed COVID-19 precautions and safety measures with patient. He and wife works from home and follows strict precautions. Patient states his CHF is well managed and he doesn't need any further interventions at this time. Will unenroll patient from care management for now. Encouraged him to reach out to me if he has questions or concerns. Patient verbalized understanding.

## 2018-12-26 ENCOUNTER — Other Ambulatory Visit: Payer: Self-pay | Admitting: Internal Medicine

## 2018-12-26 DIAGNOSIS — E1065 Type 1 diabetes mellitus with hyperglycemia: Secondary | ICD-10-CM

## 2019-01-02 ENCOUNTER — Encounter (INDEPENDENT_AMBULATORY_CARE_PROVIDER_SITE_OTHER): Payer: Self-pay

## 2019-01-03 ENCOUNTER — Encounter (INDEPENDENT_AMBULATORY_CARE_PROVIDER_SITE_OTHER): Payer: Self-pay

## 2019-01-27 ENCOUNTER — Ambulatory Visit
Admission: RE | Admit: 2019-01-27 | Discharge: 2019-01-27 | Disposition: A | Discharge status: Home / Self Care | Payer: No Typology Code available for payment source | Source: Ambulatory Visit | Attending: Cardiology | Admitting: Cardiology

## 2019-01-27 DIAGNOSIS — E7800 Pure hypercholesterolemia, unspecified: Secondary | ICD-10-CM | POA: Insufficient documentation

## 2019-01-27 DIAGNOSIS — I2510 Atherosclerotic heart disease of native coronary artery without angina pectoris: Secondary | ICD-10-CM | POA: Insufficient documentation

## 2019-01-27 LAB — BASIC METABOLIC PANEL
Anion Gap: 9 (ref 5.0–15.0)
BUN: 34 mg/dL — ABNORMAL HIGH (ref 9.0–28.0)
CO2: 21 mEq/L (ref 21–29)
Calcium: 8.6 mg/dL (ref 8.5–10.5)
Chloride: 104 mEq/L (ref 100–111)
Creatinine: 1.8 mg/dL — ABNORMAL HIGH (ref 0.5–1.5)
Glucose: 449 mg/dL — ABNORMAL HIGH (ref 70–100)
Potassium: 5.3 mEq/L — ABNORMAL HIGH (ref 3.5–5.1)
Sodium: 134 mEq/L — ABNORMAL LOW (ref 136–145)

## 2019-01-27 LAB — LIPID PANEL
Cholesterol / HDL Ratio: 3.8
Cholesterol: 183 mg/dL (ref 0–199)
HDL: 48 mg/dL (ref 40–9999)
Triglycerides: 463 mg/dL — ABNORMAL HIGH (ref 34–149)

## 2019-01-27 LAB — HEMOLYSIS INDEX: Hemolysis Index: 10 (ref 0–18)

## 2019-01-27 LAB — GFR: EGFR: 38.7

## 2019-02-02 ENCOUNTER — Encounter (INDEPENDENT_AMBULATORY_CARE_PROVIDER_SITE_OTHER): Payer: Self-pay

## 2019-02-03 ENCOUNTER — Encounter (INDEPENDENT_AMBULATORY_CARE_PROVIDER_SITE_OTHER): Payer: Self-pay

## 2019-02-03 ENCOUNTER — Telehealth: Payer: Self-pay | Admitting: Student in an Organized Health Care Education/Training Program

## 2019-02-03 NOTE — Telephone Encounter (Signed)
Patient needs to be seen for acid reflux and is requesting to be squeezed in for a sooner appointment than available. Please return call at noted phone number below.        Patient declined to schedule next available appointment        Patient Preferred Callback Number:    [phone]

## 2019-02-15 ENCOUNTER — Encounter (INDEPENDENT_AMBULATORY_CARE_PROVIDER_SITE_OTHER): Payer: Self-pay | Admitting: Student in an Organized Health Care Education/Training Program

## 2019-02-15 ENCOUNTER — Ambulatory Visit (INDEPENDENT_AMBULATORY_CARE_PROVIDER_SITE_OTHER)
Payer: No Typology Code available for payment source | Admitting: Student in an Organized Health Care Education/Training Program

## 2019-02-15 VITALS — BP 150/78 | HR 64 | Temp 97.2°F | Resp 12 | Ht 72.5 in | Wt 207.2 lb

## 2019-02-15 DIAGNOSIS — N183 Chronic kidney disease, stage 3 unspecified: Secondary | ICD-10-CM

## 2019-02-15 DIAGNOSIS — Z23 Encounter for immunization: Secondary | ICD-10-CM

## 2019-02-15 DIAGNOSIS — I129 Hypertensive chronic kidney disease with stage 1 through stage 4 chronic kidney disease, or unspecified chronic kidney disease: Secondary | ICD-10-CM

## 2019-02-15 DIAGNOSIS — R1319 Other dysphagia: Secondary | ICD-10-CM

## 2019-02-15 MED ORDER — PANTOPRAZOLE SODIUM 40 MG PO TBEC
40.00 mg | DELAYED_RELEASE_TABLET | Freq: Every day | ORAL | 5 refills | Status: AC
Start: 2019-02-15 — End: 2019-08-14

## 2019-02-15 NOTE — Progress Notes (Signed)
Have you seen any specialists/other providers since your last visit with [place]?    No    Arm preference verified?   Yes    The patient is due for eye exam, shingles vaccine and PCMh, Advanced directive      Patient presented to the office for FLU administration.  Received injection in the Left arm.  No reaction was noted and patient left in good condition.

## 2019-02-15 NOTE — Progress Notes (Signed)
Chief Complaint   Patient presents with    Gastroesophageal Reflux     x 1 month pr dealing acid reflex he said its getting worse and sometime he has to throw up to getting feeling better      59 yo M, Hx Cardiomyopathy ischemic/HTN/HLD/peripheral neuropathy/Uncontrolled DM who is here regarding dysphagia     Dysphagia: Patient presents with dysphagia. The patient describes few in number episode(s) with gradual onset, usually once daily , located in the mid chest, and symptoms lasting 10 -15 min . Patient reports food has become stuck, but feels a lump sensation when swallowing. Associated with the dysphagia, patient also complains of globus sensation and heartburn. Patient denies dysphonia, hemoptysis and aspiration.  Patient denies regurgitation of undigested food.   tried antiacid   Last EGD : never   He also c/o non bloody diarrhea and constipation , he is due for colonoscopy        Hypertension: occasionally compliant    - The patient feels this issue is  Controlled at home , 120s/70s   - The patient has been monitoring blood pressure.  - The patient has  been adhering to the recommended no-added-salt diet.  - The patient has not been compliant to the recommended exercise program.  - The patient is  tolerating the current medication regimen well.  - The patient denies orthostasis.  - The patient denies chest pain.  - The patient denies dyspnea.  - The patient denies edema.    Weight Monitoring 07/31/2018 08/03/2018 02/15/2019   Height 188 cm 188 cm 184.2 cm   Height Method - - -   Weight 98.703 kg 96.435 kg 93.985 kg   Weight Method - - -   BMI (calculated) 28 kg/m2 27.4 kg/m2 27.8 kg/m2   due for colonoscopy   Feels depressed       Vitals:    02/15/19 0910   BP: 150/78   Pulse: 64   Resp: 12   Temp: 97.2 F (36.2 C)   SpO2: 98%   Weight: 94 kg (207 lb 3.2 oz)   Height: 1.842 m (6' 0.5")       Patient Active Problem List   Diagnosis    Coronary atherosclerosis    Type 2 diabetes mellitus with diabetic  polyneuropathy, with long-term current use of insulin    Statin-induced myositis    Diabetic peripheral neuropathy    Other hyperlipidemia    History of pancreatitis    Cellulitis    Open wound of right foot    Essential hypertension    Chest pain       Past Medical History:   Diagnosis Date    CKD (chronic kidney disease)     Dehydration     Diabetes mellitus     Dupre's syndrome     Hypertension     Migraine     Neuropathy     Pancreatitis     Sleep apnea     Type 1 diabetes mellitus        Past Surgical History:   Procedure Laterality Date    BONE GRAFT      BYPASS, CAROTID-SUBCLAVIAN      CARDIAC CATHETERIZATION      EYE SURGERY      WRIST SURGERY         Social History     Socioeconomic History    Marital status: Married     Spouse name: Not on file    Number of children:  Not on file    Years of education: Not on file    Highest education level: Not on file   Occupational History    Not on file   Social Needs    Financial resource strain: Not on file    Food insecurity     Worry: Not on file     Inability: Not on file    Transportation needs     Medical: Not on file     Non-medical: Not on file   Tobacco Use    Smoking status: Former Smoker     Packs/day: 0.00    Smokeless tobacco: Never Used    Tobacco comment: quit 7 years ago    Substance and Sexual Activity    Alcohol use: Yes     Alcohol/week: 1.0 standard drinks     Types: 1 Cans of beer per week     Comment: 1 pack in 6 months.    Drug use: No    Sexual activity: Never   Lifestyle    Physical activity     Days per week: Not on file     Minutes per session: Not on file    Stress: Not on file   Relationships    Social connections     Talks on phone: Not on file     Gets together: Not on file     Attends religious service: Not on file     Active member of club or organization: Not on file     Attends meetings of clubs or organizations: Not on file     Relationship status: Not on file    Intimate partner violence      Fear of current or ex partner: Not on file     Emotionally abused: Not on file     Physically abused: Not on file     Forced sexual activity: Not on file   Other Topics Concern    Not on file   Social History Narrative    Not on file       Family History   Problem Relation Age of Onset    Diabetes Mother     Heart disease Father        Allergies   Allergen Reactions    Statins      Other reaction(s): Arthralgia (Joint Pain)    Lyrica [Pregabalin]      Nerve pain, out of body experiences       Current Outpatient Medications   Medication Sig Dispense Refill    aspirin 81 MG chewable tablet Chew 81 mg by mouth daily      carvedilol (COREG) 12.5 MG tablet Take 12.5 mg by mouth every 12 (twelve) hours         ezetimibe (ZETIA) 10 MG tablet Take 10 mg by mouth daily         furosemide (LASIX) 20 MG tablet Take 20 mg by mouth 2 (two) times daily         glucose blood (ONETOUCH VERIO) test strip Check blood sugars 4 xday 120 each 5    HUMALOG KWIKPEN 100 UNIT/ML Solution Pen-injector injection pen Inject 15-20 Units into the skin 3 (three) times daily before meals 60 mL 1    Insulin Pen Needle 31G X 8 MM Misc Use with insulin pen 1000 each 3    Lancets (FREESTYLE) lancets Use 5xday 450 each 1    sacubitril-valsartan (ENTRESTO) 24-26 MG Tab per tablet 2 (two) times daily  sildenafil (VIAGRA) 50 MG tablet Take 1 tablet (50 mg total) by mouth daily as needed for Erectile Dysfunction 30 tablet 3    Tresiba FlexTouch 200 UNIT/ML Solution Pen-injector INJECT 130 UNITS INTO THE SKIN DAILY (Patient taking differently: 140 Unit   ) 27 pen 1    Continuous Blood Gluc Receiver (DEXCOM G6 RECEIVER) Device 1 each by Device route daily 1 Device 0    Continuous Blood Gluc Sensor (DEXCOM G6 SENSOR) Misc 1 each by Device route every 10 (ten) days 9 each 1    Continuous Blood Gluc Transmit (DEXCOM G6 TRANSMITTER) Misc 1 each by Device route daily 1 each 1    Evolocumab 140 MG/ML Solution Auto-injector Inject 1 mL  into the skin every 14 (fourteen) days 12 pen 3    Evolocumab 140 MG/ML Solution Auto-injector       ondansetron (ZOFRAN-ODT) 4 MG disintegrating tablet Take 1 tablet (4 mg total) by mouth every 6 (six) hours as needed for Nausea 12 tablet 0     No current facility-administered medications for this visit.        Constitutional: + fatigue,no  fever, chills   Eyes: negative for vision problems   Ears, nose, mouth, throat, and face: negative for nose bleeds, nasal congestion   Respiratory: negative for SOB, cough , wheezing   Cardiovascular: negative for CP, dizziness, palpitations, swelling , orthopnea   Gastrointestinal+diarrhea ,  No nausea, vomiting, +constipation   Genitourinary:negative for dysuria, frequency and hematuria   Integument/breast: negative for nodule   Hematologic/lymphatic: negative for enlarged LAD,   Musculoskeletal:negative for myalgia, joint pain and swelling   Neurological: negative for HA, dizziness,+ tingling ,   Behavioral/Psych: negative for anxiety, depression    Other ROS were reviewed and were neg           CONSTITUTIONAL Patient is afebrile, Vital signs reviewed. Hypertensive.   HEAD Atraumatic, Normocephallc.   EYES Eyes are normal to inspection, PERRL, No discharge from eyes,   ENT Ears normal to inspection, Nose examination normal, Posterior pharynx normal.   NECK Normal ROM, No jugular venous distention, No meningeal signs.   RESPIRATORY CHEST Chest is nontender, Breath sounds normal.   CARDIOVASCULAR RRR, Heart sounds normal, Normal S1 S2.   ABDOMEN Abdomen is nontender, No pulsatile masses, No other masses,      SKIN Skin is warm, Skin is dry, Skin is normal color   LYMPHATIC No adenopathy in neck.   PSYCHIATRIC Oriented X 3, Normal affect. Normal insight.      A/P    1. Needs flu shot  Flu Vaccine Recombinant Quadrivalent 18 yrs & up PRESERVATIVE FREE (Flublok)     1. Needs flu shot  Flu Vaccine Recombinant Quadrivalent 18 yrs & up PRESERVATIVE FREE (Flublok)   2. Other  dysphagia  FL Upper GI SBFT SGL Contrast Study    Ambulatory referral to Gastroenterology    pantoprazole (PROTONIX) 40 MG tablet   3. Stage 3 chronic kidney disease  Ambulatory referral to Nephrology     Check Upper GI studies   Start protonix  See GI   DASH  BP log   Risk & Benefits of the new medication(s) were explained to the patient (and family) who verbalized understanding & agreed to the treatment plan. Patient (family) encouraged to contact me/clinical staff with any questions/concerns  Check your blood pressure at home or at the drug store on a regular basis.  Goal is under  Patient counseled to watch concentrated sweets and  carbohydrates. Patient also counseled to exercise at least three times a week. Patient counseled about routine foot care  Patient counseled to eat a low fat, low cholesterol diet and to exercise at least three times a week for at least 30 minutes.  Patient counseled to eat a low salt diet(DASH diet)  Followup in three months

## 2019-02-23 ENCOUNTER — Other Ambulatory Visit: Payer: No Typology Code available for payment source

## 2019-02-23 ENCOUNTER — Other Ambulatory Visit (FREE_STANDING_LABORATORY_FACILITY): Payer: No Typology Code available for payment source

## 2019-02-23 DIAGNOSIS — E1065 Type 1 diabetes mellitus with hyperglycemia: Secondary | ICD-10-CM

## 2019-02-23 DIAGNOSIS — R635 Abnormal weight gain: Secondary | ICD-10-CM

## 2019-03-01 ENCOUNTER — Ambulatory Visit (INDEPENDENT_AMBULATORY_CARE_PROVIDER_SITE_OTHER): Payer: Self-pay | Admitting: Nurse Practitioner

## 2019-03-01 LAB — URINE CORTISOL FREE, 24 HR
Urine Free Cortisol: 28 (ref 3.5–45)
Urine Volume for Cortisol: 2900 mL

## 2019-03-02 ENCOUNTER — Encounter: Payer: Self-pay | Admitting: Internal Medicine

## 2019-03-03 ENCOUNTER — Encounter (INDEPENDENT_AMBULATORY_CARE_PROVIDER_SITE_OTHER): Payer: Self-pay

## 2019-03-04 ENCOUNTER — Encounter (INDEPENDENT_AMBULATORY_CARE_PROVIDER_SITE_OTHER): Payer: Self-pay

## 2019-03-05 ENCOUNTER — Encounter (INDEPENDENT_AMBULATORY_CARE_PROVIDER_SITE_OTHER): Payer: Self-pay

## 2019-03-12 ENCOUNTER — Ambulatory Visit (INDEPENDENT_AMBULATORY_CARE_PROVIDER_SITE_OTHER): Payer: Self-pay

## 2019-03-15 ENCOUNTER — Ambulatory Visit (INDEPENDENT_AMBULATORY_CARE_PROVIDER_SITE_OTHER): Payer: Self-pay | Admitting: Nurse Practitioner

## 2019-03-16 ENCOUNTER — Encounter (INDEPENDENT_AMBULATORY_CARE_PROVIDER_SITE_OTHER): Payer: Self-pay

## 2019-03-17 ENCOUNTER — Encounter (INDEPENDENT_AMBULATORY_CARE_PROVIDER_SITE_OTHER): Payer: Self-pay

## 2019-03-17 ENCOUNTER — Other Ambulatory Visit (FREE_STANDING_LABORATORY_FACILITY): Payer: No Typology Code available for payment source

## 2019-03-17 DIAGNOSIS — I255 Ischemic cardiomyopathy: Secondary | ICD-10-CM

## 2019-03-17 LAB — GFR: EGFR: 41.3

## 2019-03-17 LAB — COMPREHENSIVE METABOLIC PANEL
ALT: 23 U/L (ref 0–55)
AST (SGOT): 23 U/L (ref 5–34)
Albumin/Globulin Ratio: 0.7 — ABNORMAL LOW (ref 0.9–2.2)
Albumin: 2.5 g/dL — ABNORMAL LOW (ref 3.5–5.0)
Alkaline Phosphatase: 97 U/L (ref 38–106)
Anion Gap: 11 (ref 5.0–15.0)
BUN: 29 mg/dL — ABNORMAL HIGH (ref 9.0–28.0)
Bilirubin, Total: 0.3 mg/dL (ref 0.2–1.2)
CO2: 20 mEq/L — ABNORMAL LOW (ref 21–29)
Calcium: 8.3 mg/dL — ABNORMAL LOW (ref 8.5–10.5)
Chloride: 103 mEq/L (ref 100–111)
Creatinine: 1.7 mg/dL — ABNORMAL HIGH (ref 0.5–1.5)
Globulin: 3.5 g/dL (ref 2.0–3.7)
Glucose: 412 mg/dL — ABNORMAL HIGH (ref 70–100)
Potassium: 4.8 mEq/L (ref 3.5–5.1)
Protein, Total: 6 g/dL (ref 6.0–8.3)
Sodium: 134 mEq/L — ABNORMAL LOW (ref 136–145)

## 2019-03-17 LAB — HEMOLYSIS INDEX: Hemolysis Index: 6 (ref 0–18)

## 2019-03-20 LAB — NT-PRO B-TYPE NATRIURETIC PEPTIDE: NT-proBNP: 3029 pg/mL — ABNORMAL HIGH (ref <=76)

## 2019-03-31 ENCOUNTER — Telehealth: Payer: Self-pay | Admitting: Internal Medicine

## 2019-03-31 DIAGNOSIS — E1065 Type 1 diabetes mellitus with hyperglycemia: Secondary | ICD-10-CM

## 2019-03-31 MED ORDER — TRESIBA FLEXTOUCH 200 UNIT/ML SC SOPN
140.00 [IU] | PEN_INJECTOR | Freq: Every day | SUBCUTANEOUS | 1 refills | Status: DC
Start: 2019-03-31 — End: 2020-04-30

## 2019-03-31 NOTE — Telephone Encounter (Signed)
-----   Message from Randa Ngo II sent at 03/31/2019  1:52 PM EST -----  Regarding: Rx refill  Dr. Karie Schwalbe.,    Patient is requesting a refill for Tresiba. [facility] [address] - [place], [place] - [address] on file.     Thank you.

## 2019-03-31 NOTE — Telephone Encounter (Signed)
Rx sent 

## 2019-04-01 ENCOUNTER — Other Ambulatory Visit (FREE_STANDING_LABORATORY_FACILITY): Payer: No Typology Code available for payment source

## 2019-04-01 DIAGNOSIS — E7800 Pure hypercholesterolemia, unspecified: Secondary | ICD-10-CM

## 2019-04-01 DIAGNOSIS — I255 Ischemic cardiomyopathy: Secondary | ICD-10-CM

## 2019-04-01 LAB — BASIC METABOLIC PANEL
Anion Gap: 10 (ref 5.0–15.0)
BUN: 41 mg/dL — ABNORMAL HIGH (ref 9.0–28.0)
CO2: 24 mEq/L (ref 21–29)
Calcium: 8.4 mg/dL — ABNORMAL LOW (ref 8.5–10.5)
Chloride: 95 mEq/L — ABNORMAL LOW (ref 100–111)
Creatinine: 2.3 mg/dL — ABNORMAL HIGH (ref 0.5–1.5)
Glucose: 676 mg/dL (ref 70–100)
Potassium: 5.9 mEq/L — ABNORMAL HIGH (ref 3.5–5.1)
Sodium: 129 mEq/L — ABNORMAL LOW (ref 136–145)

## 2019-04-01 LAB — CBC
Absolute NRBC: 0 10*3/uL (ref 0.00–0.00)
Hematocrit: 40.4 % (ref 37.6–49.6)
Hgb: 14 g/dL (ref 12.5–17.1)
MCH: 30.6 pg (ref 25.1–33.5)
MCHC: 34.7 g/dL (ref 31.5–35.8)
MCV: 88.4 fL (ref 78.0–96.0)
MPV: 9.7 fL (ref 8.9–12.5)
Nucleated RBC: 0 /100 WBC (ref 0.0–0.0)
Platelets: 240 10*3/uL (ref 142–346)
RBC: 4.57 10*6/uL (ref 4.20–5.90)
RDW: 13 % (ref 11–15)
WBC: 7.39 10*3/uL (ref 3.10–9.50)

## 2019-04-01 LAB — GFR: EGFR: 29.2

## 2019-04-01 LAB — HEMOLYSIS INDEX: Hemolysis Index: 5 (ref 0–18)

## 2019-04-02 ENCOUNTER — Emergency Department
Admission: EM | Admit: 2019-04-02 | Discharge: 2019-04-02 | Disposition: A | Discharge status: Home / Self Care | Payer: No Typology Code available for payment source | Attending: Emergency Medical Services | Admitting: Emergency Medical Services

## 2019-04-02 DIAGNOSIS — Z7982 Long term (current) use of aspirin: Secondary | ICD-10-CM | POA: Insufficient documentation

## 2019-04-02 DIAGNOSIS — I129 Hypertensive chronic kidney disease with stage 1 through stage 4 chronic kidney disease, or unspecified chronic kidney disease: Secondary | ICD-10-CM | POA: Insufficient documentation

## 2019-04-02 DIAGNOSIS — E1040 Type 1 diabetes mellitus with diabetic neuropathy, unspecified: Secondary | ICD-10-CM | POA: Insufficient documentation

## 2019-04-02 DIAGNOSIS — E1065 Type 1 diabetes mellitus with hyperglycemia: Secondary | ICD-10-CM | POA: Insufficient documentation

## 2019-04-02 DIAGNOSIS — E1022 Type 1 diabetes mellitus with diabetic chronic kidney disease: Secondary | ICD-10-CM | POA: Insufficient documentation

## 2019-04-02 DIAGNOSIS — Z87891 Personal history of nicotine dependence: Secondary | ICD-10-CM | POA: Insufficient documentation

## 2019-04-02 DIAGNOSIS — N189 Chronic kidney disease, unspecified: Secondary | ICD-10-CM | POA: Insufficient documentation

## 2019-04-02 DIAGNOSIS — Z794 Long term (current) use of insulin: Secondary | ICD-10-CM | POA: Insufficient documentation

## 2019-04-02 LAB — CBC AND DIFFERENTIAL
Absolute NRBC: 0 10*3/uL (ref 0.00–0.00)
Basophils Absolute Automated: 0.09 10*3/uL — ABNORMAL HIGH (ref 0.00–0.08)
Basophils Automated: 1.2 %
Eosinophils Absolute Automated: 0.25 10*3/uL (ref 0.00–0.44)
Eosinophils Automated: 3.3 %
Hematocrit: 42.4 % (ref 37.6–49.6)
Hgb: 14.9 g/dL (ref 12.5–17.1)
Immature Granulocytes Absolute: 0.02 10*3/uL (ref 0.00–0.07)
Immature Granulocytes: 0.3 %
Lymphocytes Absolute Automated: 2.01 10*3/uL (ref 0.42–3.22)
Lymphocytes Automated: 26.7 %
MCH: 31 pg (ref 25.1–33.5)
MCHC: 35.1 g/dL (ref 31.5–35.8)
MCV: 88.1 fL (ref 78.0–96.0)
MPV: 9.6 fL (ref 8.9–12.5)
Monocytes Absolute Automated: 0.52 10*3/uL (ref 0.21–0.85)
Monocytes: 6.9 %
Neutrophils Absolute: 4.63 10*3/uL (ref 1.10–6.33)
Neutrophils: 61.6 %
Nucleated RBC: 0 /100 WBC (ref 0.0–0.0)
Platelets: 253 10*3/uL (ref 142–346)
RBC: 4.81 10*6/uL (ref 4.20–5.90)
RDW: 13 % (ref 11–15)
WBC: 7.52 10*3/uL (ref 3.10–9.50)

## 2019-04-02 LAB — COMPREHENSIVE METABOLIC PANEL
ALT: 26 U/L (ref 0–55)
AST (SGOT): 23 U/L (ref 5–34)
Albumin/Globulin Ratio: 1 (ref 0.9–2.2)
Albumin: 3.1 g/dL — ABNORMAL LOW (ref 3.5–5.0)
Alkaline Phosphatase: 100 U/L (ref 38–106)
Anion Gap: 12 (ref 5.0–15.0)
BUN: 43 mg/dL — ABNORMAL HIGH (ref 9–28)
Bilirubin, Total: 0.6 mg/dL (ref 0.2–1.2)
CO2: 20 mEq/L — ABNORMAL LOW (ref 22–29)
Calcium: 9 mg/dL (ref 8.5–10.5)
Chloride: 98 mEq/L — ABNORMAL LOW (ref 100–111)
Creatinine: 2.2 mg/dL — ABNORMAL HIGH (ref 0.7–1.3)
Globulin: 3 g/dL (ref 2.0–3.6)
Glucose: 568 mg/dL (ref 70–100)
Potassium: 5.1 mEq/L (ref 3.5–5.1)
Protein, Total: 6.1 g/dL (ref 6.0–8.3)
Sodium: 130 mEq/L — ABNORMAL LOW (ref 136–145)

## 2019-04-02 LAB — GFR: EGFR: 30.7

## 2019-04-02 LAB — GLUCOSE WHOLE BLOOD - POCT: Whole Blood Glucose POCT: 289 mg/dL — ABNORMAL HIGH (ref 70–100)

## 2019-04-02 LAB — MAGNESIUM: Magnesium: 1.9 mg/dL (ref 1.6–2.6)

## 2019-04-02 MED ORDER — SODIUM CHLORIDE 0.9 % IV BOLUS
1000.00 mL | Freq: Once | INTRAVENOUS | Status: AC
Start: 2019-04-02 — End: 2019-04-02
  Administered 2019-04-02: 15:00:00 1000 mL via INTRAVENOUS

## 2019-04-02 MED ORDER — INSULIN REGULAR HUMAN 100 UNIT/ML IJ SOLN (IV ONLY)
10.00 [IU] | Freq: Once | Status: AC
Start: 2019-04-02 — End: 2019-04-02
  Administered 2019-04-02: 17:00:00 10 [IU] via INTRAVENOUS
  Filled 2019-04-02: qty 30

## 2019-04-02 NOTE — ED Triage Notes (Signed)
pt sent by [facility] heart for elevated blood sugar, low sodium, and high potassium. pt has stage 3 kidney disease.

## 2019-04-02 NOTE — Discharge Instructions (Signed)
Diabetes Mellitus, Type I    You have been seen for diabetes.    There are only two things to keep your blood sugar levels under control. This is following your diet and insulin injections as prescribed. 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 Use your insulin as your regular doctor directed. Untreated diabetes can lead to heart problems and kidney problems (including kidney failure). It can also lead to stroke and blindness. Therefore, it is VERY IMPORTANT to follow up with your regular doctor.    Check your blood sugar before every meal and before bedtime. Keep a record of your blood sugars. Show it to your doctor at your next visit.    Use the sliding scale with Regular Insulin as your doctor prescribed.   Insulin lowers the blood sugar. If your blood sugar level gets too low, you may feel lightheaded. You may also sweat and even pass out. Always keep a snack food that contains sugar with you. If you begin to have these symptoms, eat the snack food to see if the symptoms get better. NEVER drive a car when feeling this way.    YOU SHOULD SEEK MEDICAL ATTENTION IMMEDIATELY, EITHER HERE OR AT THE NEAREST EMERGENCY DEPARTMENT, IF ANY OF THE FOLLOWING OCCURS:   Abdominal (belly) pain.   More than 3 vomiting (throwing up) episodes.   Blood sugar over 400 more than 3 times.   Fever (temperature higher than 100.53F / 38C) or shaking chills.   Unable to keep medicines down.   Any trouble swallowing or breathing.   Infection or rash on your skin.   Low blood sugar (less than 70) 3 or more times that does not get better immediately after eating.       Thank you for choosing [facility] for your emergency care needs.  We strive to provide EXCELLENT care to you and your family.      If you do not continue to improve or your condition worsens, please contact your doctor or return immediately to the Emergency Department.    ONSITE PHARMACY  Our full service  onsite pharmacy is a 2 minute walk from the ER.  Open Mon to Fri from 8 am to 8 pm, Sat and Sun 9 am to 5 pm. Ask an ED staff member for directions.  We accept all major insurances and prices are competitive with major retailers.  Ask your provider to print your prescriptions down to the pharmacy to speed you on your way home.      DOCTOR REFERRALS  Call [phone] (available 24 hours a day, 7 days a week) if you need any further referrals and we can help you find a primary care doctor or specialist.  Also, available online at:  [email or web address]    YOUR CONTACT INFORMATION  Before leaving please check with registration to make sure we have an up-to-date contact number.  You can call registration at [phone] to update your information.  For questions about your hospital bill, please call [phone].  For questions about your Emergency Dept Physician bill please call [phone].      FREE HEALTH SERVICES  If you need help with health or social services, please call 2-1-1 for a free referral to resources in your area.  2-1-1 is a free service connecting people with information on health insurance, free clinics, pregnancy, mental health, dental care, food assistance, housing, and substance abuse counseling.  Also, available  online at:  http://www.[facility].org    MEDICAL RECORDS AND TESTS  Certain laboratory test results do not come back the same day, for example urine cultures.   We will contact you if other important findings are noted.  Radiology films are often reviewed again to ensure accuracy.  If there is any discrepancy, we will notify you.      Please call [phone] to pick up a complimentary CD of any radiology studies performed.  If you or your doctor would like to request a copy of your medical records, please call [phone].          ORTHOPEDIC INJURY   Please know that significant injuries can exist even when an initial x-ray is read as normal or negative.   This can occur because some fractures (broken bones) are not initially visible on x-rays.  For this reason, close outpatient follow-up with your primary care doctor or bone specialist (orthopedist) is required.    MEDICATIONS AND FOLLOWUP  Please be aware that some prescription medications can cause drowsiness.  Use caution when driving or operating machinery.    The examination and treatment you have received in our Emergency Department is provided on an emergency basis, and is not intended to be a substitute for your primary care physician.  It is important that your doctor checks you again and that you report any new or remaining problems at that time.      24 HOUR PHARMACIES  [facility] [address] [place], [place] [address] (1.4 miles, 7 minutes)  Handout with directions available on request      ASSISTANCE WITH INSURANCE    Affordable Care Act  [facility])  Call to start or finish an application, compare plans, enroll or ask a question.  [phone]  TTY: [phone]  Web:  Healthcare.gov    Help Enrolling in [facility]  [employer] [place]  830-522-3950 (TOLL-FREE)  828-813-8959 (TTY)  Web:  Http://www.coverva.org    Local Help Enrolling in the [facility]  [place] [facility]  380-757-3129 (MAIN)  Email:  health-help@nvfs .org  Web:  [employer]  Address:  [address], [address] [place], [place] [address]

## 2019-04-03 LAB — ECG 12-LEAD
Atrial Rate: 68 {beats}/min
P Axis: 72 degrees
P-R Interval: 162 ms
Q-T Interval: 402 ms
QRS Duration: 98 ms
QTC Calculation (Bezet): 427 ms
R Axis: 26 degrees
T Axis: 171 degrees
Ventricular Rate: 68 {beats}/min

## 2019-04-04 ENCOUNTER — Encounter (INDEPENDENT_AMBULATORY_CARE_PROVIDER_SITE_OTHER): Payer: Self-pay

## 2019-04-04 NOTE — ED Provider Notes (Signed)
[facility] EMERGENCY DEPARTMENT HISTORY AND PHYSICAL EXAM    Patient Name: [name],[name]  Encounter Date:  04/02/2019  Rendering Provider: Pete Glatter, MD  Patient DOB:  [birth year]  MRN:  [id number]    History of Presenting Illness     Historian: Patient    58 y.o. male with h/o CKD, type I diabetes, Dupree's syndrome, hypertension, neuropathy presents to the ED due to concerns of multiple abnormal labs.  Patient notes that he had significant fluid retention of his extremities, 25 pound weight gain.  His cardiologist had increased his Lasix dose for several days, and he has been diuresing and feeling better.  As a follow-up to this, he had labs drawn yesterday, and there is concerns, patient sent for elevated glucose, low sodium and elevated potassium.  Patient denies chest pain, shortness of breath, vomiting diarrhea.    PMD:  Bouhouch, Jacqualyn Posey, MD    Past Medical History     Past Medical History:   Diagnosis Date    CKD (chronic kidney disease)     Dehydration     Diabetes mellitus     Dupre's syndrome     Hypertension     Migraine     Neuropathy     Pancreatitis     Sleep apnea     Type 1 diabetes mellitus        Past Surgical History     Past Surgical History:   Procedure Laterality Date    BONE GRAFT      BYPASS, CAROTID-SUBCLAVIAN      CARDIAC CATHETERIZATION      EYE SURGERY      WRIST SURGERY         Family History     Family History   Problem Relation Age of Onset    Diabetes Mother     Heart disease Father        Social History     Social History     Socioeconomic History    Marital status: Married     Spouse name: Not on file    Number of children: Not on file    Years of education: Not on file    Highest education level: Not on file   Occupational History    Not on file   Social Needs    Financial resource strain: Not on file    Food insecurity     Worry: Not on file     Inability: Not on file    Transportation needs     Medical: Not on file     Non-medical: Not  on file   Tobacco Use    Smoking status: Former Smoker     Packs/day: 0.00    Smokeless tobacco: Never Used    Tobacco comment: quit 7 years ago    Substance and Sexual Activity    Alcohol use: Yes     Alcohol/week: 1.0 standard drinks     Types: 1 Cans of beer per week     Comment: 1 pack in 6 months.    Drug use: No    Sexual activity: Never   Lifestyle    Physical activity     Days per week: Not on file     Minutes per session: Not on file    Stress: Not on file   Relationships    Social connections     Talks on phone: Not on file     Gets together: Not on file  Attends religious service: Not on file     Active member of club or organization: Not on file     Attends meetings of clubs or organizations: Not on file     Relationship status: Not on file    Intimate partner violence     Fear of current or ex partner: Not on file     Emotionally abused: Not on file     Physically abused: Not on file     Forced sexual activity: Not on file   Other Topics Concern    Not on file   Social History Narrative    Not on file       Home Medications     Home medications reviewed by ED MD     Discharge Medication List as of 04/02/2019  5:58 PM      CONTINUE these medications which have NOT CHANGED    Details   aspirin 81 MG chewable tablet Chew 81 mg by mouth daily, Historical Med      carvedilol (COREG) 12.5 MG tablet Take 12.5 mg by mouth every 12 (twelve) hours   , Historical Med      Continuous Blood Gluc Receiver (DEXCOM G6 RECEIVER) Device 1 each by Device route daily, Starting Mon 08/03/2018, Normal      Continuous Blood Gluc Sensor (DEXCOM G6 SENSOR) Misc 1 each by Device route every 10 (ten) days, Starting Mon 08/03/2018, Normal      Continuous Blood Gluc Transmit (DEXCOM G6 TRANSMITTER) Misc 1 each by Device route daily, Starting Mon 08/03/2018, Normal      Evolocumab 140 MG/ML Solution Auto-injector Inject 1 mL into the skin every 14 (fourteen) days, Starting Mon 03/02/2018, Print      ezetimibe (ZETIA) 10 MG  tablet Take 10 mg by mouth daily   , Historical Med      furosemide (LASIX) 20 MG tablet Take 20 mg by mouth 2 (two) times daily   , Historical Med      glucose blood (ONETOUCH VERIO) test strip Check blood sugars 4 xday, Normal      HUMALOG KWIKPEN 100 UNIT/ML Solution Pen-injector injection pen Inject 15-20 Units into the skin 3 (three) times daily before meals, Starting Mon 08/03/2018, Until Tue 08/03/2019, Normal      Insulin Degludec Evaristo Bury FlexTouch) 200 UNIT/ML Solution Pen-injector Inject 140 Unit into the skin daily, Starting Wed 03/31/2019, Normal      Insulin Pen Needle 31G X 8 MM Misc Use with insulin pen, Print      Lancets (FREESTYLE) lancets Use 5xday, Normal      ondansetron (ZOFRAN-ODT) 4 MG disintegrating tablet Take 1 tablet (4 mg total) by mouth every 6 (six) hours as needed for Nausea, Starting Fri 02/13/2018, Print      pantoprazole (PROTONIX) 40 MG tablet Take 1 tablet (40 mg total) by mouth daily, Starting Mon 02/15/2019, Until Sat 08/14/2019, E-Rx      sacubitril-valsartan (ENTRESTO) 24-26 MG Tab per tablet 2 (two) times daily   , Historical Med      sildenafil (VIAGRA) 50 MG tablet Take 1 tablet (50 mg total) by mouth daily as needed for Erectile Dysfunction, Starting Fri 06/12/2018, Normal             Review of Systems       Constitutional:  No fever  Eyes: No discharge   ENT: No ST  CV:  No CP   Resp:  No SOB or cough  GI: No abd pain, N, V,  D  GU: No dysuria  MS:    Skin: No rash  Neuro:  No HA  Psych:  No behavior changes  All other systems reviewed and negative    Physical Exam     BP 150/73    Pulse 72    Temp 98.1 F (36.7 C) (Oral)    Resp 20    Wt 95.6 kg    SpO2 96%    BMI 28.19 kg/m         CONSTITUTIONAL   Patient is afebrile, Vital Signs Reviewed, chronically ill-appearing, older than stated age.  Patient appears comfortable, Alert.  HEAD   Atraumatic, Normocephalic.  EYES   Eyes are normal to inspection, No discharge from eyes, Sclera are normal.  ENT   Mouth normal to inspection.  Moist oral mucosa.  NECK   Normal ROM, No jugular venous distention, Normal inspection.  RESPIRATORY CHEST    Breath sounds normal, No respiratory distress.   CARDIOVASCULAR   RRR, Heart sounds normal, Normal S1 S2.  ABDOMEN   Abdomen is nontender. No distension, No peritoneal signs.  UPPER EXTREMITY   Inspection normal, No cyanosis, No clubbing, No edema.  LOWER EXTREMITY  Inspection normal, No cyanosis, No clubbing, 1-2+ bilateral symmetric lower extremity edema  NEURO   GCS is 15, No focal motor deficits. Speech is normal, appropriate.  SKIN   Skin is warm, Skin is dry, Skin is normal color.  PSYCHIATRIC   Oriented X 3, Normal affect.        ED Medications Administered     ED Medication Orders (From admission, onward)    Start Ordered     Status Ordering Provider    04/02/19 1615 04/02/19 1621  insulin regular (HumuLIN R) 10 Units intravenous injection  Once     Route: Intravenous  Ordered Dose: 10 Units     Last MAR action: Given Amelita Risinger S    04/02/19 1454 04/02/19 1453  sodium chloride 0.9 % bolus 1,000 mL  Once     Route: Intravenous  Ordered Dose: 1,000 mL     Last MAR action: Stopped Keisy Strickler S          Orders Placed During This Encounter     Orders Placed This Encounter   Procedures    CBC and differential    Comprehensive metabolic panel    Magnesium    GFR    Cardiac monitoring (ED Only)    Glucose Whole Blood - POCT    ECG 12 Lead       Diagnostic Study Results     The results of the diagnostic studies below were reviewed by the ED provider:    Labs  Results     Procedure Component Value Units Date/Time    Glucose Whole Blood - POCT [[id number]]  (Abnormal) Collected: 04/02/19 1743     Updated: 04/02/19 1748     Whole Blood Glucose POCT 289 mg/dL     Magnesium [[id number]] Collected: 04/02/19 1517    Specimen: Blood Updated: 04/02/19 1547     Magnesium 1.9 mg/dL     Narrative:      Replace urinary catheter prior to obtaining the urine culture  if it has been in place for greater  than or equal to 14  days:->N/A No Foley  Indications for U/A Reflex to Micro - Reflex to  Culture:->Suprapubic Pain/Tenderness or Dysuria    GFR [[id number]] Collected: 04/02/19 1517     Updated: 04/02/19 1547  EGFR 30.7    Narrative:      Replace urinary catheter prior to obtaining the urine culture  if it has been in place for greater than or equal to 14  days:->N/A No Foley  Indications for U/A Reflex to Micro - Reflex to  Culture:->Suprapubic Pain/Tenderness or Dysuria    Comprehensive metabolic panel [[id number]]  (Abnormal) Collected: 04/02/19 1517    Specimen: Blood Updated: 04/02/19 1547     Glucose 568 mg/dL      BUN 43 mg/dL      Creatinine 2.2 mg/dL      Sodium 696 mEq/L      Potassium 5.1 mEq/L      Chloride 98 mEq/L      CO2 20 mEq/L      Calcium 9.0 mg/dL      Protein, Total 6.1 g/dL      Albumin 3.1 g/dL      AST (SGOT) 23 U/L      ALT 26 U/L      Alkaline Phosphatase 100 U/L      Bilirubin, Total 0.6 mg/dL      Globulin 3.0 g/dL      Albumin/Globulin Ratio 1.0     Anion Gap 12.0    Narrative:      Replace urinary catheter prior to obtaining the urine culture  if it has been in place for greater than or equal to 14  days:->N/A No Foley  Indications for U/A Reflex to Micro - Reflex to  Culture:->Suprapubic Pain/Tenderness or Dysuria    CBC and differential [[id number]]  (Abnormal) Collected: 04/02/19 1517    Specimen: Blood Updated: 04/02/19 1525     WBC 7.52 x10 3/uL      Hgb 14.9 g/dL      Hematocrit 44.0 %      Platelets 253 x10 3/uL      RBC 4.81 x10 6/uL      MCV 88.1 fL      MCH 31.0 pg      MCHC 35.1 g/dL      RDW 13 %      MPV 9.6 fL      Neutrophils 61.6 %      Lymphocytes Automated 26.7 %      Monocytes 6.9 %      Eosinophils Automated 3.3 %      Basophils Automated 1.2 %      Immature Granulocytes 0.3 %      Nucleated RBC 0.0 /100 WBC      Neutrophils Absolute 4.63 x10 3/uL      Lymphocytes Absolute Automated 2.01 x10 3/uL      Monocytes Absolute Automated 0.52 x10 3/uL      Eosinophils  Absolute Automated 0.25 x10 3/uL      Basophils Absolute Automated 0.09 x10 3/uL      Immature Granulocytes Absolute 0.02 x10 3/uL      Absolute NRBC 0.00 x10 3/uL     Narrative:      Replace urinary catheter prior to obtaining the urine culture  if it has been in place for greater than or equal to 14  days:->N/A No Foley  Indications for U/A Reflex to Micro - Reflex to  Culture:->Suprapubic Pain/Tenderness or Dysuria          Radiologic Studies  Radiology Results (24 Hour)     ** No results found for the last 24 hours. **          MD Attestations     I,  Pete Glatter, MD, personally performed the services documented. I reviewed and confirm the accuracy of the information in this medical record.    Rendering Provider: Pete Glatter, MD    Monitors, EKG, Critical Care, and Splints     EKG (interpreted by ED physician): Normal sinus rhythm, rate of 68.  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  No appreciable acute ischemic abnormality, no peaked T waves.      MDM and Clinical Notes       Patient referred to ED due to abnormal labs drawn yesterday.  His labs show markedly elevated glucose levels, 500s.  Sodium 130 and potassium 5.1 are not concerning.  His creatinine is at his baseline.  Patient is comfortable, normal vital signs.  Patient notes that unfortunately episodes of high blood sugars are not uncommon, and he is comfortable addressing that at home.  He did receive IV insulin here which brought his blood sugar down nicely.  Patient is comfortable going home, and will follow up beginning of next week with cardiology.      Diagnosis and Disposition     Clinical Impression  1. Hyperglycemia due to type 1 diabetes mellitus        Disposition  ED Disposition     ED Disposition Condition Date/Time Comment    Discharge  Fri Apr 02, 2019  5:58 PM [name] discharge to home/self care.    Condition at disposition: Stable          Prescriptions       Discharge Medication List as of 04/02/2019  5:58  PM             Pamala Hurry, MD  04/04/19 [phone]

## 2019-04-05 ENCOUNTER — Ambulatory Visit (INDEPENDENT_AMBULATORY_CARE_PROVIDER_SITE_OTHER): Payer: Self-pay | Admitting: Cardiology

## 2019-04-05 ENCOUNTER — Encounter (INDEPENDENT_AMBULATORY_CARE_PROVIDER_SITE_OTHER): Payer: Self-pay

## 2019-04-05 LAB — NT-PRO B-TYPE NATRIURETIC PEPTIDE: NT-proBNP: 872 pg/mL — ABNORMAL HIGH (ref <=76)

## 2019-04-08 ENCOUNTER — Encounter (INDEPENDENT_AMBULATORY_CARE_PROVIDER_SITE_OTHER): Payer: Self-pay

## 2019-04-20 ENCOUNTER — Encounter (INDEPENDENT_AMBULATORY_CARE_PROVIDER_SITE_OTHER): Payer: Self-pay

## 2019-05-04 ENCOUNTER — Encounter (INDEPENDENT_AMBULATORY_CARE_PROVIDER_SITE_OTHER): Payer: Self-pay

## 2019-05-05 ENCOUNTER — Encounter (INDEPENDENT_AMBULATORY_CARE_PROVIDER_SITE_OTHER): Payer: Self-pay

## 2019-05-17 ENCOUNTER — Telehealth: Payer: Self-pay

## 2019-05-17 NOTE — Telephone Encounter (Signed)
CONFIRMED AND SCREENED FOR 05-19-19 OV.

## 2019-05-17 NOTE — Telephone Encounter (Signed)
Called lmom informing patient of appointment. klh 

## 2019-05-19 ENCOUNTER — Ambulatory Visit: Payer: 59 | Admitting: Adult Health

## 2019-05-19 ENCOUNTER — Encounter: Payer: Self-pay | Admitting: Adult Health

## 2019-05-19 ENCOUNTER — Other Ambulatory Visit: Payer: Self-pay

## 2019-05-19 ENCOUNTER — Encounter (INDEPENDENT_AMBULATORY_CARE_PROVIDER_SITE_OTHER): Payer: Self-pay

## 2019-05-19 VITALS — BP 140/90 | HR 67 | Temp 97.2°F | Resp 16 | Ht 73.0 in | Wt 214.0 lb

## 2019-05-19 DIAGNOSIS — E1165 Type 2 diabetes mellitus with hyperglycemia: Secondary | ICD-10-CM | POA: Diagnosis not present

## 2019-05-19 DIAGNOSIS — E1140 Type 2 diabetes mellitus with diabetic neuropathy, unspecified: Secondary | ICD-10-CM

## 2019-05-19 DIAGNOSIS — R6881 Early satiety: Secondary | ICD-10-CM

## 2019-05-19 DIAGNOSIS — I10 Essential (primary) hypertension: Secondary | ICD-10-CM | POA: Diagnosis not present

## 2019-05-19 DIAGNOSIS — Z794 Long term (current) use of insulin: Secondary | ICD-10-CM

## 2019-05-19 DIAGNOSIS — I509 Heart failure, unspecified: Secondary | ICD-10-CM

## 2019-05-19 LAB — POCT GLYCOSYLATED HEMOGLOBIN (HGB A1C): Hemoglobin A1C: 11.1 % — AB (ref 4.0–5.6)
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
## 2019-05-31 ENCOUNTER — Encounter: Payer: Self-pay | Admitting: *Deleted

## 2019-06-01 ENCOUNTER — Other Ambulatory Visit: Payer: Self-pay

## 2019-06-01 ENCOUNTER — Encounter: Payer: Self-pay | Admitting: Cardiovascular Disease

## 2019-06-01 ENCOUNTER — Ambulatory Visit (INDEPENDENT_AMBULATORY_CARE_PROVIDER_SITE_OTHER): Payer: 59 | Admitting: Cardiovascular Disease

## 2019-06-01 VITALS — BP 140/78 | HR 56 | Ht 73.0 in | Wt 217.8 lb

## 2019-06-01 DIAGNOSIS — I255 Ischemic cardiomyopathy: Secondary | ICD-10-CM | POA: Diagnosis not present

## 2019-06-01 DIAGNOSIS — N1832 Chronic kidney disease, stage 3b: Secondary | ICD-10-CM

## 2019-06-01 DIAGNOSIS — I25118 Atherosclerotic heart disease of native coronary artery with other forms of angina pectoris: Secondary | ICD-10-CM

## 2019-06-01 DIAGNOSIS — Z794 Long term (current) use of insulin: Secondary | ICD-10-CM

## 2019-06-01 DIAGNOSIS — E118 Type 2 diabetes mellitus with unspecified complications: Secondary | ICD-10-CM | POA: Diagnosis not present
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
## 2019-06-01 NOTE — Patient Instructions (Signed)

## 2019-06-04 ENCOUNTER — Encounter (INDEPENDENT_AMBULATORY_CARE_PROVIDER_SITE_OTHER): Payer: Self-pay

## 2019-06-05 ENCOUNTER — Encounter (INDEPENDENT_AMBULATORY_CARE_PROVIDER_SITE_OTHER): Payer: Self-pay

## 2019-06-30 ENCOUNTER — Telehealth: Payer: Self-pay

## 2019-06-30 NOTE — Telephone Encounter (Signed)
Confirmed telephone visit on 07/02/2019. klh

## 2019-07-02 ENCOUNTER — Encounter: Payer: Self-pay | Admitting: Adult Health

## 2019-07-02 ENCOUNTER — Ambulatory Visit: Payer: 59 | Admitting: Adult Health

## 2019-07-02 VITALS — BP 180/89 | HR 67 | Resp 16 | Ht 73.0 in | Wt 215.0 lb

## 2019-07-02 DIAGNOSIS — E1140 Type 2 diabetes mellitus with diabetic neuropathy, unspecified: Secondary | ICD-10-CM

## 2019-07-02 DIAGNOSIS — Z794 Long term (current) use of insulin: Secondary | ICD-10-CM

## 2019-07-02 DIAGNOSIS — I10 Essential (primary) hypertension: Secondary | ICD-10-CM

## 2019-07-02 DIAGNOSIS — K219 Gastro-esophageal reflux disease without esophagitis: Secondary | ICD-10-CM | POA: Diagnosis not present

## 2019-07-02 NOTE — Progress Notes (Signed)
[facility] [place], [place] [address]  Internal MEDICINE  Telephone Visit  Patient Name: [name] [name]  [id number]  [id number]  Date of Service: 07/02/2019  I connected with the patient at 1208 by telephone and verified the patients identity using two identifiers.   I discussed the limitations, risks, security and privacy concerns of performing an evaluation and management service by telephone and the availability of in person appointments. I also discussed with the patient that there may be a patient responsible charge related to the service.  The patient expressed understanding and agrees to proceed.    Chief Complaint  Patient presents with  . Telephone Assessment  . Telephone Screen    needs appt for moblity scooter, wants to know when he can get a covid vacc   . Diabetes  . Gastroesophageal Reflux  . Sleep Apnea  . Hypertension  . Hyperlipidemia    HPI  Pt is seen via telephone. He reports today he is in need of a mobility scooter.  His neuropathy is so severe that he is unable to walk even short distance without resting.   His blood pressure is generally controlled, he had a conversation with his boss about quitting his job and so his pressure was elevated. He generally is well controlled. His gerd is controlled at this time.  He is seeing endocrinology for DM at this time.      Current Medication: Outpatient Encounter Medications as of 07/02/2019  Medication Sig  . carvedilol (COREG) 12.5 MG tablet Take 12.5 mg by mouth 2 (two) times daily with a meal.  . Evolocumab (REPATHA SURECLICK) XX123456 MG/ML SOAJ Inject 140 mg into the skin every 14 (fourteen) days.  Marland Kitchen ezetimibe (ZETIA) 10 MG tablet Take 10 mg by mouth daily.  . furosemide (LASIX) 20 MG tablet Take 20 mg by mouth. 2 - 3 times daily for edema  . hydrALAZINE (APRESOLINE) 25 MG tablet Take 25 mg by mouth 3 (three) times daily.  . insulin degludec (TRESIBA FLEXTOUCH) 100 UNIT/ML SOPN FlexTouch  Pen Inject 140 Units into the skin daily.  . insulin lispro (HUMALOG KWIKPEN) 100 UNIT/ML KwikPen Inject into the skin. 0-20 units per sliding scale  . pantoprazole (PROTONIX) 40 MG tablet Take 40 mg by mouth daily.  . sacubitril-valsartan (ENTRESTO) 24-26 MG Take 1 tablet by mouth 2 (two) times daily.   No facility-administered encounter medications on file as of 07/02/2019.    Surgical History: Past Surgical History:  Procedure Laterality Date  . bone graft surgery    . CARDIAC CATHETERIZATION  2013   S/p PCI   . CARDIAC CATHETERIZATION  2018   S/p CABG  . CARDIAC SURGERY    . CORONARY ARTERY BYPASS GRAFT  2018   (LIMA-LAD,VG-RCA,VG-OM1,VG-D1)  . EYE SURGERY    . PERIPHERAL VASCULAR CATHETERIZATION Right 10/28/2016   PTA/DEB Right SFA  . WRIST SURGERY      Medical History: Past Medical History:  Diagnosis Date  . Arrhythmia   . Arthritis   . CAD (coronary artery disease)   . Cardiomyopathy, ischemic   . CHF (congestive heart failure) (HCC)    NYHA II-III  . Colon polyps   . Diabetes ([place])   . Dupre's syndrome   . GERD (gastroesophageal reflux disease)   . Hyperlipidemia   . Hypertension   . Migraine   . Neuropathy   . Peripheral vascular disease ([place])   . Retinopathy   . Sleep apnea   . Stage 3 chronic  kidney disease   . Stomach ulcer     Family History: Family History  Problem Relation Age of Onset  . Diabetes Mother   . Heart disease Father     Social History   Socioeconomic History  . Marital status: Married    Spouse name: Not on file  . Number of children: Not on file  . Years of education: Not on file  . Highest education level: Not on file  Occupational History  . Not on file  Tobacco Use  . Smoking status: Former Smoker    Types: Cigarettes    Quit date: 05/18/2012    Years since quitting: 7.1  . Smokeless tobacco: Never Used  Substance and Sexual Activity  . Alcohol use: Yes    Comment: occasional   . Drug use: Never  . Sexual  activity: Not on file  Other Topics Concern  . Not on file  Social History Narrative  . Not on file   Social Determinants of Health   Financial Resource Strain:   . Difficulty of Paying Living Expenses: Not on file  Food Insecurity:   . Worried About Charity fundraiser in the Last Year: Not on file  . Ran Out of Food in the Last Year: Not on file  Transportation Needs:   . Lack of Transportation (Medical): Not on file  . Lack of Transportation (Non-Medical): Not on file  Physical Activity:   . Days of Exercise per Week: Not on file  . Minutes of Exercise per Session: Not on file  Stress:   . Feeling of Stress : Not on file  Social Connections:   . Frequency of Communication with Friends and Family: Not on file  . Frequency of Social Gatherings with Friends and Family: Not on file  . Attends Religious Services: Not on file  . Active Member of Clubs or Organizations: Not on file  . Attends Archivist Meetings: Not on file  . Marital Status: Not on file  Intimate Partner Violence:   . Fear of Current or Ex-Partner: Not on file  . Emotionally Abused: Not on file  . Physically Abused: Not on file  . Sexually Abused: Not on file      Review of Systems  Constitutional: Negative.  Negative for chills, fatigue and unexpected weight change.  HENT: Negative.  Negative for congestion, rhinorrhea, sneezing and sore throat.   Eyes: Negative for redness.  Respiratory: Negative.  Negative for cough, chest tightness and shortness of breath.   Cardiovascular: Negative.  Negative for chest pain and palpitations.  Gastrointestinal: Negative.  Negative for abdominal pain, constipation, diarrhea, nausea and vomiting.  Endocrine: Negative.   Genitourinary: Negative.  Negative for dysuria and frequency.  Musculoskeletal: Negative.  Negative for arthralgias, back pain, joint swelling and neck pain.  Skin: Negative.  Negative for rash.  Allergic/Immunologic: Negative.    Neurological: Positive for weakness. Negative for tremors and numbness.       Neuropathy of bilateral legs and feet.   Hematological: Negative for adenopathy. Does not bruise/bleed easily.  Psychiatric/Behavioral: Negative.  Negative for behavioral problems, sleep disturbance and suicidal ideas. The patient is not nervous/anxious.     Vital Signs: BP (!) 211/101   Pulse 67   Resp 16   Ht 6\' 1"  (1.854 m)   Wt 215 lb (97.5 kg)   SpO2 98%   BMI 28.37 kg/m    Observation/Objective:  Well sounding, NAD noted.   Assessment/Plan: 1. Type 2 diabetes mellitus  with diabetic neuropathy, with long-term current use of insulin [facility]) Seeing endocrinology at this time.  Continue current medications at this time.   2. Essential hypertension Continue to monitor. Continue current medications.   3. Gastroesophageal reflux disease without esophagitis Stable, continue current plan.  General Counseling: damarcus harriger understanding of the findings of today's phone visit and agrees with plan of treatment. I have discussed any further diagnostic evaluation that may be needed or ordered today. We also reviewed his medications today. he has been encouraged to call the office with any questions or concerns that should arise related to todays visit.    No orders of the defined types were placed in this encounter.   No orders of the defined types were placed in this encounter.   Time spent: [facility] Internal medicine

## 2019-07-05 ENCOUNTER — Encounter (INDEPENDENT_AMBULATORY_CARE_PROVIDER_SITE_OTHER): Payer: Self-pay

## 2019-07-06 ENCOUNTER — Encounter (INDEPENDENT_AMBULATORY_CARE_PROVIDER_SITE_OTHER): Payer: Self-pay

## 2019-07-06 ENCOUNTER — Telehealth: Payer: Self-pay

## 2019-07-06 NOTE — Telephone Encounter (Signed)
DISABILITY PARKING PLACARD SIGNED AND READY AT FRONT DESK FOR PICK UP.

## 2019-07-19 ENCOUNTER — Other Ambulatory Visit: Payer: Self-pay

## 2019-07-19 ENCOUNTER — Encounter: Payer: Self-pay | Admitting: Adult Health

## 2019-07-19 ENCOUNTER — Ambulatory Visit: Payer: 59 | Admitting: Adult Health

## 2019-07-19 VITALS — BP 154/72 | HR 71 | Temp 97.1°F | Resp 16 | Ht 73.0 in | Wt 218.0 lb

## 2019-07-19 DIAGNOSIS — Z794 Long term (current) use of insulin: Secondary | ICD-10-CM

## 2019-07-19 DIAGNOSIS — E1140 Type 2 diabetes mellitus with diabetic neuropathy, unspecified: Secondary | ICD-10-CM | POA: Diagnosis not present

## 2019-07-19 DIAGNOSIS — I10 Essential (primary) hypertension: Secondary | ICD-10-CM

## 2019-07-19 DIAGNOSIS — L8992 Pressure ulcer of unspecified site, stage 2: Secondary | ICD-10-CM | POA: Diagnosis not present
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
## 2019-07-21 ENCOUNTER — Telehealth: Payer: Self-pay | Admitting: Cardiovascular Disease

## 2019-07-21 NOTE — Telephone Encounter (Signed)
Pt c/o Shortness Of Breath: STAT if SOB developed within the last 24 hours or pt is noticeably SOB on the phone  1. Are you currently SOB (can you hear that pt is SOB on the phone)? Yes (no)  2. How long have you been experiencing SOB? Last 2 days it has been getting worse   3. Are you SOB when sitting or when up moving around? Sitting and leaning forward is the only time he can get air - walking and laying down makes him SOB  4. Are you currently experiencing any other symptoms? No, states no discomfort in heart

## 2019-07-21 NOTE — Telephone Encounter (Signed)
Spoke with patient and he reports shortness of breath and lower extremity swelling. He did not have his daily weight and was not aware to monitor that. Reviewed that we should have him come in to be seen so that we can review medications and determine if changes need to be made. He was agreeable to see a APP so confirmed appointment time for Monday and advised that he needs to arrive early due to entrance at the [facility]. He has taken increased doses to help with SOB but given his kidney issues he needs to come in. He verbalized understanding, was agreeable with plan, and had no further questions at this time.

## 2019-07-22 ENCOUNTER — Encounter: Payer: 59 | Attending: Physician Assistant | Admitting: Physician Assistant

## 2019-07-22 ENCOUNTER — Other Ambulatory Visit: Payer: Self-pay

## 2019-07-22 DIAGNOSIS — Z87891 Personal history of nicotine dependence: Secondary | ICD-10-CM | POA: Insufficient documentation

## 2019-07-22 DIAGNOSIS — I2510 Atherosclerotic heart disease of native coronary artery without angina pectoris: Secondary | ICD-10-CM | POA: Insufficient documentation

## 2019-07-22 DIAGNOSIS — I130 Hypertensive heart and chronic kidney disease with heart failure and stage 1 through stage 4 chronic kidney disease, or unspecified chronic kidney disease: Secondary | ICD-10-CM | POA: Insufficient documentation

## 2019-07-22 DIAGNOSIS — L97512 Non-pressure chronic ulcer of other part of right foot with fat layer exposed: Secondary | ICD-10-CM | POA: Insufficient documentation

## 2019-07-22 DIAGNOSIS — N183 Chronic kidney disease, stage 3 unspecified: Secondary | ICD-10-CM | POA: Diagnosis not present

## 2019-07-22 DIAGNOSIS — M1990 Unspecified osteoarthritis, unspecified site: Secondary | ICD-10-CM | POA: Diagnosis not present

## 2019-07-22 DIAGNOSIS — E1122 Type 2 diabetes mellitus with diabetic chronic kidney disease: Secondary | ICD-10-CM | POA: Insufficient documentation

## 2019-07-22 DIAGNOSIS — E11621 Type 2 diabetes mellitus with foot ulcer: Secondary | ICD-10-CM | POA: Diagnosis not present

## 2019-07-22 DIAGNOSIS — G4730 Sleep apnea, unspecified: Secondary | ICD-10-CM | POA: Insufficient documentation

## 2019-07-22 DIAGNOSIS — E1140 Type 2 diabetes mellitus with diabetic neuropathy, unspecified: Secondary | ICD-10-CM | POA: Insufficient documentation

## 2019-07-22 DIAGNOSIS — L84 Corns and callosities: Secondary | ICD-10-CM | POA: Diagnosis not present

## 2019-07-22 DIAGNOSIS — I5042 Chronic combined systolic (congestive) and diastolic (congestive) heart failure: Secondary | ICD-10-CM | POA: Insufficient documentation

## 2019-07-23 NOTE — Progress Notes (Signed)
KARAPET, MARTELLI ([id number]) Visit Report for 07/22/2019 Allergy List Details Patient Name: [name] [name], [name] [name] Date of Service: 07/22/2019 2:15 PM Medical Record Number: [id number] Patient Account Number: [id number] Date of Birth/Sex: [birth year] (59 y.o. M) Treating RN: [name] Primary Care Drewey Begue: [name] Other Clinician: Referring Gagandeep Pettet: Versie Starks, ADAM Treating Lily Kernen/Extender: [name], [name]: 0 Allergies Active Allergies Statins-Hmg-Coa Reductase Inhibitors Allergy Notes Electronic Signature(s) Signed: 07/22/2019 5:02:05 PM By: [name] Entered By: [name] on 07/22/2019 14:29:36 [name] [name] ([id number]) -------------------------------------------------------------------------------- Arrival Information Details Patient Name: [name] [name] Date of Service: 07/22/2019 2:15 PM Medical Record Number: [id number] Patient Account Number: [id number] Date of Birth/Sex: 1960/11/03 (58 y.o. M) Treating RN: [name] Primary Care Maye Parkinson: [name] Other Clinician: Referring Deckard Stuber: Versie Starks, ADAM Treating Vaanya Shambaugh/Extender: [name], [name]: 0 Visit Information Patient Arrived: Cane Arrival Time: 14:01 Accompanied By: wife Transfer Assistance: None Patient Identification Verified: Yes Secondary Verification Process Completed: Yes Electronic Signature(s) Signed: 07/22/2019 4:39:13 PM By: Lorine Bears RCP, RRT, CHT Entered By: Lorine Bears on 07/22/2019 14:01:42 [name] [name] ([id number]) -------------------------------------------------------------------------------- Clinic Level of Care Assessment Details Patient Name: [name] [name] Date of Service: 07/22/2019 2:15 PM Medical Record Number: [id number] Patient Account Number: [id number] Date of Birth/Sex: Apr 17, 1961 (58 y.o. M) Treating RN: [name] Primary Care Junaid Wurzer: [name] Other Clinician: Referring  Gelena Klosinski: Versie Starks, ADAM Treating Fayette Gasner/Extender: [name], [name]: 0 Clinic Level of Care Assessment Items TOOL 1 Quantity Score []  - Use when EandM and Procedure is performed on INITIAL visit 0 ASSESSMENTS - Nursing Assessment / Reassessment X - General Physical Exam (combine w/ comprehensive assessment (listed just below) when performed on new pt. 1 20 evals) X- 1 25 Comprehensive Assessment (HX, ROS, Risk Assessments, Wounds Hx, etc.) ASSESSMENTS - [name] and Skin Assessment / Reassessment []  - Dermatologic / Skin Assessment (not related to [name] area) 0 ASSESSMENTS - Ostomy and/or Continence Assessment and Care []  - Incontinence Assessment and Management 0 []  - 0 Ostomy Care Assessment and Management (repouching, etc.) PROCESS - Coordination of Care X - Simple Patient / Family Education for ongoing care 1 15 []  - 0 Complex (extensive) Patient / Family Education for ongoing care X- 1 10 Staff obtains Programmer, systems, Records, Test Results / Process Orders []  - 0 Staff telephones HHA, Nursing Homes / Clarify orders / etc []  - 0 Routine Transfer to another Facility (non-emergent condition) []  - 0 Routine Hospital Admission (non-emergent condition) X- 1 15 New Admissions / Biomedical engineer / Ordering NPWT, Apligraf, etc. []  - 0 Emergency Hospital Admission (emergent condition) PROCESS - Special Needs []  - Pediatric / Minor Patient Management 0 []  - 0 Isolation Patient Management []  - 0 Hearing / Language / Visual special needs []  - 0 Assessment of Community assistance (transportation, D/C planning, etc.) []  - 0 Additional assistance / Altered mentation []  - 0 Support Surface(s) Assessment (bed, cushion, seat, etc.) INTERVENTIONS - Miscellaneous []  - External ear exam 0 []  - 0 Patient Transfer (multiple staff / Civil Service fast streamer / Similar devices) []  - 0 Simple Staple / Suture removal (25 or less) []  - 0 Complex Staple / Suture removal (26 or  more) []  - 0 Hypo/Hyperglycemic Management (do not check if billed separately) [name], [name] ([id number]) []  - 0 Ankle / Brachial Index (ABI) - do not check if billed separately Has the patient been seen at the hospital within the last three years: Yes Total Score: 85 Level Of Care: New/Established - Level 3 Electronic Signature(s) Signed:  07/23/2019 9:24:26 AM By: [name] Entered By: [name] on 07/22/2019 15:32:35 [name] [name] ([id number]) -------------------------------------------------------------------------------- Encounter Discharge Information Details Patient Name: [name] [name] Date of Service: 07/22/2019 2:15 PM Medical Record Number: [id number] Patient Account Number: [id number] Date of Birth/Sex: 1961-03-01 (58 y.o. M) Treating RN: [name] Primary Care Karlina Suares: [name] Other Clinician: Referring Fabiana Dromgoole: Versie Starks, ADAM Treating Jamilet Ambroise/Extender: [name], [name]: 0 Encounter Discharge Information Items Post Procedure Vitals Discharge Condition: Stable Temperature (F): 98.0 Ambulatory Status: Ambulatory Pulse (bpm): 56 Discharge Destination: Home Respiratory Rate (breaths/min): 16 Transportation: Private Auto Blood Pressure (mmHg): 169/68 Accompanied By: spouse Schedule Follow-up Appointment: Yes Clinical Summary of Care: Electronic Signature(s) Signed: 07/23/2019 9:24:26 AM By: [name] Entered By: [name] on 07/22/2019 15:36:32 [name] [name] ([id number]) -------------------------------------------------------------------------------- Lower Extremity Assessment Details Patient Name: [name] [name] Date of Service: 07/22/2019 2:15 PM Medical Record Number: [id number] Patient Account Number: [id number] Date of Birth/Sex: [birth year] (58 y.o. M) Treating RN: [name] Primary Care Wilho Sharpley: [name] Other Clinician: Referring Samah Lapiana: Versie Starks, ADAM Treating Vrinda Heckstall/Extender: [name],  [name]: 0 Edema Assessment Assessed: [Left: No] [Right: No] Edema: [Left: No] [Right: No] Vascular Assessment Pulses: Dorsalis Pedis Palpable: [Left:Yes] [Right:Yes] Doppler Audible: [Left:Yes] [Right:Yes] Posterior Tibial Palpable: [Left:Yes] [Right:Yes] Doppler Audible: [Left:Yes] [Right:Yes] Popliteal Palpable: [Left:Yes] [Right:Yes] Doppler Audible: [Left:Yes] [Right:Yes] Blood Pressure: Brachial: [Left:160] [Right:160] Dorsalis Pedis: 152 [Left:Dorsalis Pedis: [id number] Ankle: Posterior Tibial: 140 [Left:Posterior Tibial: 138 0.95] [Right:0.90] Electronic Signature(s) Signed: 07/22/2019 5:02:05 PM By: [name] Entered By: [name] on 07/22/2019 14:56:37 [name] [name] ([id number]) -------------------------------------------------------------------------------- Multi [name] Chart Details Patient Name: [name] [name] Date of Service: 07/22/2019 2:15 PM Medical Record Number: [id number] Patient Account Number: [id number] Date of Birth/Sex: 09-20-1960 (58 y.o. 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 Extremity Extremity Comorbid History: Sleep Apnea, Congestive Heart Sleep Apnea, Congestive Heart Sleep Apnea, Congestive Heart Failure, Coronary Artery Disease, Failure, Coronary Artery Disease, Failure, Coronary Artery Disease, Hypertension, Peripheral Venous Hypertension,  Peripheral Venous Hypertension, Peripheral Venous Disease, Type II Diabetes, History of Disease, Type II Diabetes, History of Disease, Type II Diabetes, History of Burn, Osteoarthritis, Neuropathy Burn, Osteoarthritis, Neuropathy Burn, Osteoarthritis, Neuropathy Date Acquired: 07/07/2019 07/18/2019 06/30/2019 Weeks of Treatment: 0 0 0 [name] Status: Open Open Open Pending Amputation on Yes Yes Yes Presentation: Measurements L x W x D (cm) 0.2x0.2x0.3 6.5x2x0.1 2x0.2x0.2 Area (cm) : 0.031 10.21 0.314 Volume (cm) : 0.009 1.021 0.063 Starting Position 1 (o'clock): 1 Ending Position 1 (o'clock): 1 Maximum Distance 1 (cm): 0.3 Undermining: Yes No No Classification: Grade 1 Grade 1 Grade 1 Exudate Amount: Medium Medium Medium Exudate Type: Serous Serous Serous Exudate Color: amber amber amber [name] Margin: Flat and Intact Flat and Intact Flat and Intact Granulation Amount: Medium (34-66%) None Present (0%) Small (1-33%) Granulation Quality: Pink N/A Pink Necrotic Amount: Medium (34-66%) Large (67-100%) Large (67-100%) Necrotic Tissue: Adherent Ama Exposed Structures: Fat Layer (Subcutaneous Tissue) Fat Layer (Subcutaneous Tissue) Fat Layer (Subcutaneous Tissue) Exposed: Yes Exposed: Yes Exposed: Yes Fascia: No Fascia: No Fascia: No Tendon: No Tendon: No  Tendon: No Muscle: No Muscle: No Muscle: No Joint: No Joint: No Joint: No Bone: No Bone: No Bone: No Epithelialization: None Medium (34-66%) None [name], [name] ([id number]) Treatment Notes Electronic Signature(s) Signed: 07/23/2019 9:24:26 AM By: [name] Entered By: [name] on 07/22/2019 15:12:03 [name] [name] ([id number]) -------------------------------------------------------------------------------- [place] Details Patient Name: [name] [name] Date of Service: 07/22/2019 2:15 PM Medical Record Number: [id number] Patient Account Number: [id number] Date of  Birth/Sex: 30-Jun-1960 (58 y.o. M) Treating RN: [name] Primary Care Ritchard Paragas: [name] Other Clinician: Referring Charmayne Odell: Versie Starks, ADAM Treating Vanya Carberry/Extender: [name], [name]: 0 Active Inactive Orientation to the [name] Nursing Diagnoses: Knowledge deficit related to the [name] program Goals: Patient/caregiver will verbalize understanding of the [facility] Date Initiated: 07/22/2019 Target Resolution Date: 08/06/2019 Goal Status: Active Interventions: Provide education on orientation to the [name] center Notes: [name]/Skin Impairment Nursing Diagnoses: Impaired tissue integrity Goals: Ulcer/skin breakdown will have a volume reduction of 30% by week 4 Date Initiated: 07/22/2019 Target Resolution Date: 08/17/2019 Goal Status: Active Interventions: Assess ulceration(s) every visit Notes: Electronic Signature(s) Signed: 07/23/2019 9:24:26 AM By: [name] Entered By: [name] on 07/22/2019 15:11:43 [name] [name] ([id number]) -------------------------------------------------------------------------------- Pain Assessment Details Patient Name: [name] [name] Date of Service: 07/22/2019 2:15 PM Medical Record Number: [id number] Patient Account Number: [id number] Date of Birth/Sex: [birth year] (58 y.o. M) Treating RN: [name] Primary Care Nicollette Wilhelmi: [name] Other Clinician: Referring Naidelyn Parrella: Versie Starks, ADAM Treating Damonie Furney/Extender: [name], [name]: 0 Active Problems Location of Pain Severity and Description of Pain Patient Has Paino Yes Site Locations Rate the pain. Current Pain Level: Insensate Pain Management and Medication Current Pain Management: Electronic Signature(s) Signed: 07/22/2019 4:39:13 PM By: Lorine Bears RCP, RRT, CHT Signed: 07/23/2019 9:24:26 AM By: [name] Entered By: Lorine Bears on 07/22/2019  14:02:39 [name] [name] ([id number]) -------------------------------------------------------------------------------- Patient/Caregiver Education Details Patient Name: [name] [name] Date of Service: 07/22/2019 2:15 PM Medical Record Number: [id number] Patient Account Number: [id number] Date of Birth/Gender: [birth year] (58 y.o. M) Treating RN: [name] Primary Care Physician: [name] Other Clinician: Referring Physician: Versie Starks, ADAM Treating Physician/Extender: Sharalyn Ink in Treatment: 0 Education Assessment Education Provided To: Patient Education Topics Provided [name]/Skin Impairment: Handouts: Caring for Your Ulcer Methods: Demonstration Responses: State content correctly Electronic Signature(s) Signed: 07/23/2019 9:24:26 AM By: [name] Entered By: [name] on 07/22/2019 15:33:04 [name] [name] ([id number]) -------------------------------------------------------------------------------- [name] Assessment Details Patient Name: [name] [name] Date of Service: 07/22/2019 2:15 PM Medical Record Number: [id number] Patient Account Number: [id number] Date of Birth/Sex: 08/11/1960 (58 y.o. M) Treating RN: [name] Primary Care Takeesha Isley: [name] Other Clinician: Referring Shayonna Ocampo: Versie Starks, ADAM Treating Lorilee Cafarella/Extender: [name], [name]: 0 [name] Status [name] Number: 1 Primary Diabetic [name]/Ulcer of the Lower Extremity Etiology: [name] Location: Right Toe Great [name] Open Wounding Event: Gradually Appeared Status: Date Acquired: 07/07/2019 Comorbid Sleep Apnea, Congestive Heart Failure, Coronary Artery Weeks Of Treatment: 0 History: Disease, Hypertension, Peripheral Venous Disease, Type II Clustered [name]: No Diabetes, History of Burn, Osteoarthritis, Neuropathy Pending Amputation On Presentation Photos [name] Measurements Length: (cm) 0.2 % Reduct Width: (cm) 0.2 % Reduct Depth: (cm) 0.3 Epitheli Area: (cm)  0.031 Tunneli Volume: (cm) 0.009 Undermi Start Endin Maxim ion in Area: ion in Volume: alization: None ng: No ning: Yes ing Position (o'clock): 1 g Position (o'clock): 1 um Distance: (cm) 0.3 [name] Description Classification: Grade 1 Foul [name] [name] Margin: Flat and Intact Slough/F Exudate Amount:  Medium Exudate Type: Serous Exudate Color: amber r After Cleansing: No ibrino Yes [name] Bed Granulation Amount: Medium (34-66%) Exposed Structure Granulation Quality: Pink Fascia Exposed: No Necrotic Amount: Medium (34-66%) Fat Layer (Subcutaneous Tissue) Exposed: Yes Necrotic Quality: Adherent Slough Tendon Exposed: No Muscle Exposed: No Joint Exposed: No Bone Exposed: No Treatment Notes [name] #1 (Right [address], MCBRATNEY ([id number]) Notes Silver AG,ABD to foot, foam to toe, comform Electronic Signature(s) Signed: 07/22/2019 5:02:05 PM By: [name] Entered By: [name] on 07/22/2019 14:44:51 [name] [name] ([id number]) -------------------------------------------------------------------------------- [name] Assessment Details Patient Name: [name] [name] Date of Service: 07/22/2019 2:15 PM Medical Record Number: [id number] Patient Account Number: [id number] Date of Birth/Sex: Oct 16, 1960 (58 y.o. M) Treating RN: [name] Primary Care Damichael Hofman: [name] Other Clinician: Referring Vipul Cafarelli: Versie Starks, ADAM Treating Tavonna Worthington/Extender: [name], [name]: 0 [name] Status [name] Number: 2 Primary Diabetic [name]/Ulcer of the Lower Extremity Etiology: [name] Location: Right Foot - Plantar [name] Open Wounding Event: Blister Status: Date Acquired: 07/18/2019 Comorbid Sleep Apnea, Congestive Heart Failure, Coronary Artery Weeks Of Treatment: 0 History: Disease, Hypertension, Peripheral Venous Disease, Type II Clustered [name]: No Diabetes, History of Burn, Osteoarthritis, Neuropathy Pending Amputation On Presentation Photos [name]  Measurements Length: (cm) 6.5 % Red Width: (cm) 2 % Red Depth: (cm) 0.1 Epith Area: (cm) 10.21 Tunn Volume: (cm) 1.021 Unde uction in Area: uction in Volume: elialization: Medium (34-66%) eling: No rmining: No [name] Description Classification: Grade 1 Foul [name] Margin: Flat and Intact Sloug Exudate Amount: Medium Exudate Type: Serous Exudate Color: amber Odor After Cleansing: No h/Fibrino No [name] Bed Granulation Amount: None Present (0%) Exposed Structure Necrotic Amount: Large (67-100%) Fascia Exposed: No Necrotic Quality: Eschar Fat Layer (Subcutaneous Tissue) Exposed: Yes Tendon Exposed: No Muscle Exposed: No Joint Exposed: No Bone Exposed: No Treatment Notes [name] #2 (Right, Plantar Foot) Notes Silver AG,ABD to foot, foam to toe, comform Electronic Signature(s) TRAVEION, ROTUNDO ([id number]) Signed: 07/22/2019 5:02:05 PM By: [name] Entered By: [name] on 07/22/2019 14:46:39 [name] [name] ([id number]) -------------------------------------------------------------------------------- Vitals Details Patient Name: [name] [name] Date of Service: 07/22/2019 2:15 PM Medical Record Number: [id number] Patient Account Number: [id number] Date of Birth/Sex: 03/13/1961 (59 y.o. M) Treating RN: [name] Primary Care Scotlyn Mccranie: [name] Other Clinician: Referring Cassidey Barrales: Versie Starks, ADAM Treating Megham Dwyer/Extender: [name], [name]: 0 Vital Signs Time Taken: 14:00 Temperature (F): 98.0 Height (in): 74 Pulse (bpm): 56 Source: Stated Respiratory Rate (breaths/min): 16 Weight (lbs): 221 Blood Pressure (mmHg): 165/68 Source: Measured Reference Range: 80 - 120 mg / dl Body Mass Index (BMI): 28.4 Electronic Signature(s) Signed: 07/22/2019 4:39:13 PM By: Lorine Bears RCP, RRT, CHT Entered By: Lorine Bears on 07/22/2019 14:03:20

## 2019-07-23 NOTE — Progress Notes (Signed)
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 with Pressure. The procedure was tolerated well. Post Debridement Measurements: 6.5cm length x 2cm width x 0.1cm depth; 1.021cm^3 volume. Character of Wound/Ulcer Post Debridement is stable. Severity of Tissue Post Debridement is: Fat layer exposed. Post procedure Diagnosis Wound #2: Same as Pre-Procedure A Callus Pairing procedure was performed. by [name], Eladia Frame E., PA-C. Post procedure Diagnosis Wound #: Same as Pre-Procedure [name] [name], [name] [name] ([id number]) Notes: Patient had callus. From the heel where there was a questionable crack and open wound there was nothing open post callus paring. Plan Wound Cleansing: Wound #1 Right Toe Great: Clean wound with Normal Saline. - In office Dial antibacterial soap, wash wounds, rinse and pat dry prior to dressing wounds Wound #2 Right,Plantar Foot: Clean wound with Normal Saline. - In office Dial antibacterial soap, wash wounds, rinse and pat dry prior to dressing wounds Primary Wound  Dressing: Wound #1 Right Toe Great: Silver Alginate Wound #2 Right,Plantar Foot: Silver Alginate Secondary Dressing: Wound #1 Right Toe Great: ABD pad - Plantar foot Foam - Great  toe Conform/Kerlix - secured with tape Wound #2 Right,Plantar Foot: ABD pad - Plantar foot Foam - Great toe Conform/Kerlix - secured with tape Dressing Change Frequency: Change dressing every other day. Follow-up Appointments: Wound #1 Right Toe Great: Return Appointment in 1 week. Wound #2 Right,Plantar Foot: Return Appointment in 1 week. 1. I would recommend at this time that we go ahead and initiate treatment with a silver alginate dressing patient is in agreement with the plan. 2. I am going to recommend as well that we use foam over the great toe and subsequently an ABD pad over the central portion of the foot that should help the pad things quite nicely. 3. I am to recommend that he continue to use his PEG assist offloading shoe it actually appears to be quite cushioning I think that will do well for him as well. 4. I am also going to suggest that he try to limit his walking is much as possible apparently he does not do much in the way of walking which is okay in this regard as well. We will see patient back for reevaluation in 1 week here in the clinic. If anything worsens or changes patient will contact our office for additional recommendations. Electronic Signature(s) Signed: 07/22/2019 3:41:19 PM By: Worthy Keeler PA-C Entered By: Worthy Keeler on 07/22/2019 15:41:19 [name] [name] ([id number]) -------------------------------------------------------------------------------- ROS/PFSH Details Patient Name: [name] [name] Date of Service: 07/22/2019 2:15 PM Medical Record Number: [id number] Patient Account Number: [id number] Date of Birth/Sex: 04/24/1961 (58 y.o. M) Treating RN: [name] Primary Care Provider: [name] Other Clinician: Referring Provider: Versie Starks, ADAM Treating Provider/Extender: [name], Tomorrow Dehaas Weeks in Treatment: 0 Information Obtained From Patient Constitutional Symptoms (General Health) Complaints and Symptoms: Negative for: Fatigue;  Fever; Chills; Marked Weight Change Eyes Complaints and Symptoms: Negative for: Dry Eyes; Vision Changes; Glasses / Contacts Ear/Nose/Mouth/Throat Complaints and Symptoms: Negative for: Difficult clearing ears; Sinusitis Hematologic/Lymphatic Complaints and Symptoms: Negative for: Bleeding / Clotting Disorders; Human Immunodeficiency Virus Respiratory Complaints and Symptoms: Negative for: Chronic or frequent coughs; Shortness of Breath Medical History: Positive for: Sleep Apnea Negative for: Aspiration; Asthma; Chronic Obstructive Pulmonary Disease (COPD); Pneumothorax; Tuberculosis Cardiovascular Complaints and Symptoms: Positive for: LE edema Negative for: Chest pain Medical History: Positive for: Congestive Heart Failure; Coronary Artery Disease; Hypertension; Peripheral Venous Disease Negative for: Angina; Arrhythmia; Deep Vein Thrombosis; Hypotension; Myocardial Infarction; Peripheral Arterial Disease; Phlebitis; Vasculitis Past Medical History Notes: CABG 2018 Gastrointestinal Complaints and Symptoms: Negative for: Frequent diarrhea; Nausea; Vomiting Endocrine Complaints and Symptoms: Negative for: Hepatitis; Thyroid disease; Polydypsia (Excessive Thirst) Medical History: Positive for: Type II Diabetes Negative for: Type I Diabetes Treated with: Insulin Blood sugar tested every day: Yes Tested [name] [name], [name] [name] ([id number]) Genitourinary Complaints and Symptoms: Positive for: Kidney failure/ Dialysis - CKD stage 3 Negative for: Incontinence/dribbling Medical History: Past Medical History Notes: CKD stage 3 Immunological Complaints and Symptoms: Negative for: Hives; Itching Integumentary (Skin) Complaints and Symptoms: Positive for: Wounds Negative for: Bleeding or bruising tendency; Breakdown; Swelling Medical History: Positive for: History of Burn Negative for: History of pressure wounds Musculoskeletal Complaints and Symptoms: Negative for: Muscle  Pain; Muscle Weakness Medical History: Positive for: Osteoarthritis Negative for: Gout; Rheumatoid Arthritis; Osteomyelitis Neurologic Complaints and Symptoms: Negative for: Numbness/parasthesias; Focal/Weakness Medical History: Positive for: Neuropathy Negative for: Dementia; Quadriplegia; Paraplegia; Seizure Disorder  Past Medical History Notes: Dupre's contractures Psychiatric Complaints and Symptoms: Negative for: Anxiety; Claustrophobia Oncologic Immunizations Pneumococcal Vaccine: Received Pneumococcal Vaccination: Yes Immunization Notes: up to date Implantable Devices None Family and Social History Cancer: No; Diabetes: Yes - Mother; Heart Disease: Yes - Father; Hereditary Spherocytosis: No; Hypertension: Yes - Father; Kidney Disease: No; Lung Disease: No; Seizures: No; Stroke: No; Thyroid Problems: No; Tuberculosis: No; Former smoker - 7 years; Marital Status - Married; Alcohol Use: Never; Drug Use: No History; Caffeine Use: Daily; Financial Concerns: No; Food, Clothing or Shelter Needs: No; Support System Lacking: No; Transportation Concerns: No [name] [name], [name] [name] ([id number]) Electronic Signature(s) Signed: 07/22/2019 5:02:05 PM By: [name] Signed: 07/22/2019 5:55:32 PM By: Worthy Keeler PA-C Entered By: [name] on 07/22/2019 14:34:41 [name] [name] ([id number]) -------------------------------------------------------------------------------- SuperBill Details Patient Name: [name] [name] Date of Service: 07/22/2019 Medical Record Number: [id number] Patient Account Number: [id number] Date of Birth/Sex: 01-05-1961 (59 y.o. 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 I10 Essential (primary) hypertension I25.10 Atherosclerotic heart disease of native coronary artery without angina pectoris N18.30 Chronic kidney disease, stage 3 unspecified L84 Corns and callosities Facility Procedures CPT4 Code: [id number] Description: 99213 - WOUND CARE VISIT-LEV 3 EST PT Modifier: Quantity: 1 CPT4 Code: [id number] Description: 11042 - DEB SUBQ TISSUE 20 SQ CM/< Modifier: Quantity: 1 CPT4 Code: Description: ICD-10 Diagnosis Description L97.512 Non-pressure chronic ulcer of other part of right foot with fat layer exposed Modifier: Quantity: CPT4 Code: [id number] Description: 11055 - PARE BENIGN LES; SGL Modifier: Quantity: 1 CPT4 Code: Description: ICD-10 Diagnosis Description L97.512 Non-pressure chronic ulcer of other part of right foot with fat layer exposed Modifier: Quantity: Physician Procedures CPT4 [id number] Description: WC PHYS LEVEL 3 o NEW PT Modifier: 25 Quantity: 1 CPT4 Code: Description: ICD-10 Diagnosis Description E11.621 Type 2 diabetes mellitus with foot ulcer L97.512 Non-pressure chronic ulcer of other part of right foot with fat layer exposed I50.42 Chronic combined systolic (congestive) and diastolic (congestive)  heart failure I10 Essential (primary) hypertension Modifier: Quantity: CPT4 [id number] Description: 11042 - WC PHYS SUBQ TISS 20 SQ CM Modifier: Quantity: 1 CPT4 Code: Description: ICD-10 Diagnosis Description L97.512 Non-pressure chronic ulcer of other part of right foot with fat layer exposed Modifier: Quantity: CPT4 [id number] Description: [id number] - WC PHYS PARE BENIGN LES; SGL Modifier: Quantity: 1 CPT4 Code: Description: ICD-10 Diagnosis Description L97.512 Non-pressure chronic ulcer of other part of right foot with fat layer exposed Modifier: Quantity: Electronic Signature(s) Signed:  07/22/2019 3:41:42 PM By: Worthy Keeler PA-C Entered By: Worthy Keeler on 07/22/2019 15:41:42

## 2019-07-23 NOTE — Progress Notes (Signed)
DREY, GOODMAN ([id number]) Visit Report for 07/22/2019 Abuse/Suicide Risk Screen Details Patient Name: [name] [name], [name] [name] Date of Service: 07/22/2019 2:15 PM Medical Record Number: [id number] Patient Account Number: [id number] Date of Birth/Sex: 01-03-1961 (59 y.o. M) Treating RN: [name] Primary Care Omarri Eich: [name] Other Clinician: Referring Krystalyn Kubota: Versie Starks, ADAM Treating Keyari Kleeman/Extender: [name], [name]: 0 Abuse/Suicide Risk Screen Items Answer ABUSE RISK SCREEN: Has anyone close to you tried to hurt or harm you recentlyo No Do you feel uncomfortable with anyone in your familyo No Has anyone forced you do things that you didnot want to doo No Electronic Signature(s) Signed: 07/22/2019 5:02:05 PM By: [name] Entered By: [name] on 07/22/2019 14:34:51 [name] [name] ([id number]) -------------------------------------------------------------------------------- Activities of Daily Living Details Patient Name: [name] [name] Date of Service: 07/22/2019 2:15 PM Medical Record Number: [id number] Patient Account Number: [id number] Date of Birth/Sex: Aug 08, 1960 (58 y.o. M) Treating RN: [name] Primary Care Ahtziri Jeffries: [name] Other Clinician: Referring Korben Carcione: Versie Starks, ADAM Treating Kaius Daino/Extender: [name], [name]: 0 Activities of Daily Living Items Answer Activities of Daily Living (Please select one for each item) Drive Automobile Completely Able Take Medications Completely Able Use Telephone Completely Able Care for Appearance Completely Able Use Toilet Completely Able Bath / Shower Completely Able Dress Self Completely Able Feed Self Completely Able Walk Completely Able Get In / Out Bed Completely Able Housework Completely Able Prepare Meals Completely [place] for Self Completely Able Electronic Signature(s) Signed: 07/22/2019 5:02:05 PM By: [name] Entered By: [name] on 07/22/2019 14:35:10 [name] [name] ([id number]) -------------------------------------------------------------------------------- Education Screening Details Patient Name: [name] [name] Date of Service: 07/22/2019 2:15 PM Medical Record Number: [id number] Patient Account Number: [id number] Date of Birth/Sex: 11/15/1960 (58 y.o. M) Treating RN: [name] Primary Care Tamer Baughman: [name] Other Clinician: Referring Cherrie Franca: Versie Starks, ADAM Treating Dayson Aboud/Extender: [name], [name]: 0 Primary Learner Assessed: Patient Learning Preferences/Education Level/Primary Language Learning Preference: Explanation, Demonstration Highest Education Level: College or Above Preferred Language: English Cognitive Barrier Language Barrier: No Translator Needed: No Memory Deficit: No Emotional Barrier: No Cultural/Religious Beliefs Affecting Medical Care: No Physical Barrier Impaired Vision: No Impaired Hearing: No Decreased Hand dexterity: No Knowledge/Comprehension Knowledge Level: Medium Comprehension Level: Medium Ability to understand written instructions: Medium Ability to understand verbal instructions: Medium Motivation Anxiety Level: Calm Cooperation: Cooperative Education Importance: Acknowledges Need Interest in Health Problems: Asks Questions Perception: Coherent Willingness to Engage in Self-Management Medium Activities: Readiness to Engage in Self-Management Medium Activities: Electronic Signature(s) Signed: 07/22/2019 5:02:05 PM By: [name] Entered By: [name] on 07/22/2019 14:35:32 [name] [name] ([id number]) -------------------------------------------------------------------------------- Fall Risk Assessment Details Patient Name: [name] [name] Date of Service: 07/22/2019 2:15 PM Medical Record Number: [id number] Patient Account Number: [id number] Date of Birth/Sex: Sep 29, 1960 (58 y.o.  M) Treating RN: [name] Primary Care Julicia Krieger: [name] Other Clinician: Referring Irena Gaydos: Versie Starks, ADAM Treating Jameria Bradway/Extender: [name], [name]: 0 Fall Risk Assessment Items Have you had 2 or more falls in the last 12 monthso 0 No Have you had any fall that resulted in injury in the last 12 monthso 0 No FALLS RISK SCREEN History of falling - immediate or within 3 months 0 No Secondary diagnosis (Do you have 2 or more medical diagnoseso) 0 No Ambulatory aid None/bed rest/wheelchair/nurse 0 No Crutches/cane/walker 15 Yes Furniture 0 No Intravenous therapy Access/Saline/Heparin Lock 0 No Gait/Transferring Normal/ bed rest/ wheelchair 0 Yes Weak (short steps with or without shuffle, stooped but able  to lift head while walking, may seek 0 No support from furniture) Impaired (short steps with shuffle, may have difficulty arising from chair, head down, impaired 0 No balance) Mental Status Oriented to own ability 0 Yes Electronic Signature(s) Signed: 07/22/2019 5:02:05 PM By: [name] Entered By: [name] on 07/22/2019 14:35:57 [name] [name] ([id number]) -------------------------------------------------------------------------------- Foot Assessment Details Patient Name: [name] [name] Date of Service: 07/22/2019 2:15 PM Medical Record Number: [id number] Patient Account Number: [id number] Date of Birth/Sex: [birth year] (58 y.o. M) Treating RN: [name] Primary Care Belma Dyches: [name] Other Clinician: Referring Jozeph Persing: Versie Starks, ADAM Treating Primrose Oler/Extender: [name], [name]: 0 Foot Assessment Items Site Locations + = Sensation present, - = Sensation absent, C = Callus, U = Ulcer R = Redness, W = Warmth, M = Maceration, PU = Pre-ulcerative lesion F = Fissure, S = Swelling, D = Dryness Assessment Right: Left: Other Deformity: No No Prior Foot Ulcer: No No Prior Amputation: No No Charcot Joint:  No No Ambulatory Status: Ambulatory With Help Assistance Device: Cane Gait: Steady Electronic Signature(s) Signed: 07/22/2019 5:02:05 PM By: [name] Entered By: [name] on 07/22/2019 14:39:12 [name] [name] ([id number]) -------------------------------------------------------------------------------- Nutrition Risk Screening Details Patient Name: [name] [name] Date of Service: 07/22/2019 2:15 PM Medical Record Number: [id number] Patient Account Number: [id number] Date of Birth/Sex: January 12, 1961 (59 y.o. M) Treating RN: [name] Primary Care Shemika Robbs: [name] Other Clinician: Referring Taiki Buckwalter: Versie Starks, ADAM Treating Ezeriah Luty/Extender: [name], [name]: 0 Height (in): 74 Weight (lbs): 221 Body Mass Index (BMI): 28.4 Nutrition Risk Screening Items Score Screening NUTRITION RISK SCREEN: I have an illness or condition that made me change the kind and/or amount of food I eat 0 No I eat fewer than two meals per day 0 No I eat few fruits and vegetables, or milk products 0 No I have three or more drinks of beer, liquor or wine almost every day 0 No I have tooth or mouth problems that make it hard for me to eat 0 No I don't always have enough money to buy the food I need 0 No I eat alone most of the time 0 No I take three or more different prescribed or over-the-counter drugs a day 1 Yes Without wanting to, I have lost or gained 10 pounds in the last six months 0 No I am not always physically able to shop, cook and/or feed myself 0 No Nutrition Protocols Good Risk Protocol 0 No interventions needed Moderate Risk Protocol High Risk Proctocol Risk Level: Good Risk Score: 1 Electronic Signature(s) Signed: 07/22/2019 5:02:05 PM By: [name] Entered By: [name] on 07/22/2019 14:36:04

## 2019-07-25 NOTE — Progress Notes (Signed)
Office Visit    Patient Name: [name] [name] Date of Encounter: 07/26/2019  Primary Care Provider:  [name], MD Primary Cardiologist:  Ida Rogue, MD Electrophysiologist:  None   Chief Complaint    [name] [name] is a 59 y.o. male with a hx of CAD s/p CABG x4 in 2018, CKDIII, GERD, HTN, DM2, OSA not on CPAP, HLD with statin intolerance presents today for lower extremity edema and shortness of breath.    Past Medical History    Past Medical History:  Diagnosis Date  . Arrhythmia   . Arthritis   . CAD (coronary artery disease)   . Cardiomyopathy, ischemic   . CHF (congestive heart failure) (HCC)    NYHA II-III  . Colon polyps   . Diabetes ([place])   . Dupre's syndrome   . GERD (gastroesophageal reflux disease)   . Hyperlipidemia   . Hypertension   . Migraine   . Neuropathy   . Peripheral vascular disease ([place])   . Retinopathy   . Sleep apnea   . Stage 3 chronic kidney disease   . Stomach ulcer    Past Surgical History:  Procedure Laterality Date  . bone graft surgery    . CARDIAC CATHETERIZATION  2013   S/p PCI   . CARDIAC CATHETERIZATION  2018   S/p CABG  . CARDIAC SURGERY    . CORONARY ARTERY BYPASS GRAFT  2018   (LIMA-LAD,VG-RCA,VG-OM1,VG-D1)  . EYE SURGERY    . PERIPHERAL VASCULAR CATHETERIZATION Right 10/28/2016   PTA/DEB Right SFA  . WRIST SURGERY      Allergies  Allergies  Allergen Reactions  . Statins     Pain  With joint stiffness   . Atorvastatin     Muscle aches  . Crestor [Rosuvastatin]     Muscle Aches  . Lyrica [Pregabalin]     Generalized aches and pains  . Pravastatin     Muscle aches    History of Present Illness    [name] [name] is a 59 y.o. male with a hx of CAD s/p CABG x4 in 2018, CKDIII, GERD, HTN, DM2, OSA not on CPAP, HLD with statin intolerance. He was last seen 06/01/19 by Dr. Rockey Situ.  CABG from [place] in 2018 with LIMA to LAD, SVG to RCA, OM1, D1. Previous EF 35% presumably in 2018 from records. October  2019 EF 50%. Lexiscan Myoview 2019 with small to moderate inferolateral infarction without ischemia. Prior notes indicate EF 40-45% in October 2020.   He was seen recently by wound clinic for stage 2 pressure injury.   Called the office 07/21/19 noting shortness of breath and lower extremity edema.   Has been doing 4 pills (20mg ) per day of lasix for about 2 weeks. Reports his shortness of breath happens several times per day. Some relief with leaning forward. Happens both at rest and with activity. No cough nor wheeze. He is taking Lasix 20mg  every 6 hours. Tells me he has not noted a real change in his swelling with increasing his dose. No noted orthopnea nor PND.   Reports blood pressure have been going up at home. Previously SBP 105-140. Now 140-160s. Reports recent stress with becoming unemployed. We discussed that this could contribute to his elevated blood pressures.   Drinks propel throughout the day. Tells me he previously had issues becoming dehydrated and having to be hospitalized. Drinks 6 bottles of propel per day. We reviewed his October echocardiogram with EF 40-45% and discussed fluid restriction.   EKGs/Labs/Other  Studies Reviewed:   The following studies were reviewed today:  Echo 03/12/19 1. LVEF 40-45% 2. Mid to basal aspects of inferior wall and inferolateral walls are severely hypokinetic 3. Gr1DD 4. No major valve abnormalities 5. compared to last echo from 2019, EF has gone down slightly from 50% to now 40-45%  Aortic valve with moderate aortic valve sclerosis with no stenosis nor regurgitation  EKG:  EKG is ordered today.  The ekg ordered today demonstrates SR 72 bpm with nonspecific ST/T wave change in the inferior leads. No acute ST/T wave changes.   Recent Labs: No results found for requested labs within last 8760 hours.  Recent Lipid Panel No results found for: CHOL, TRIG, HDL, CHOLHDL, VLDL, LDLCALC, LDLDIRECT  Home Medications   Current Meds    Medication Sig  . carvedilol (COREG) 12.5 MG tablet Take 12.5 mg by mouth 2 (two) times daily with a meal.  . Evolocumab (REPATHA SURECLICK) XX123456 MG/ML SOAJ Inject 140 mg into the skin every 14 (fourteen) days.  Marland Kitchen ezetimibe (ZETIA) 10 MG tablet Take 10 mg by mouth daily.  . furosemide (LASIX) 20 MG tablet Take 20 mg by mouth. 2 - 3 times daily for edema  . hydrALAZINE (APRESOLINE) 50 MG tablet Take 1 tablet (50 mg total) by mouth 3 (three) times daily.  . insulin degludec (TRESIBA FLEXTOUCH) 100 UNIT/ML SOPN FlexTouch Pen Inject 140 Units into the skin daily.  . insulin lispro (HUMALOG KWIKPEN) 100 UNIT/ML KwikPen Inject into the skin. 0-20 units per sliding scale  . pantoprazole (PROTONIX) 40 MG tablet Take 40 mg by mouth daily.  . sacubitril-valsartan (ENTRESTO) 24-26 MG Take 1 tablet by mouth 2 (two) times daily.  . [DISCONTINUED] hydrALAZINE (APRESOLINE) 25 MG tablet Take 25 mg by mouth 3 (three) times daily.    Review of Systems      Review of Systems  Constitution: Negative for chills, fever and malaise/fatigue.  Cardiovascular: Positive for dyspnea on exertion and leg swelling. Negative for chest pain, near-syncope, orthopnea, palpitations and syncope.  Respiratory: Positive for shortness of breath. Negative for cough and wheezing.   Gastrointestinal: Negative for nausea and vomiting.  Neurological: Negative for dizziness, light-headedness and weakness.   All other systems reviewed and are otherwise negative except as noted above.  Physical Exam    VS:  BP (!) 170/80 (BP Location: Left Arm, Patient Position: Sitting, Cuff Size: Normal)   Pulse 67   Ht 6\' 2"  (1.88 m)   Wt 224 lb 4 oz (101.7 kg)   SpO2 98%   BMI 28.79 kg/m  , BMI Body mass index is 28.79 kg/m. GEN: Well nourished, well developed, in no acute distress. HEENT: normal. Neck: Supple, no JVD, carotid bruits, or masses. Cardiac: RRR, no murmurs, rubs, or gallops. No clubbing, cyanosis. Bilateral LE with 2+ pitting  edema. Radials/PT 2+ and equal bilaterally.  Respiratory:  Respirations regular and unlabored, clear to auscultation bilaterally. GI: Soft, nontender, nondistended, BS + x 4. MS: No deformity or atrophy. Skin: Warm and dry, no rash. Gauze wrap noted to right foot. Bilateral lower extremity with venous stasis changes. Neuro:  Strength and sensation are intact. Psych: Normal affect.  Assessment & Plan    1. Chronic systolic heart failure - Increased LE edema and SOB over the last 2 weeks. He self increased Lasix from 20mg  2-3x per day to 4x per day. NYHA II. Weight up 7lb from office visit in January. Echo 02/2019 with LVEF 40-45%.   Reports previous intolerance of  increased dose of Entresto secondary to hyperkalemia. Ideally, would begin Spironolactone. BMET today.   BNP today.  Defer changes in diuretic dosing until labs obtained. Consider increased dose to 40mg  BID or transition to Torsemide.   Recommend restrict to 2L fluid (presently drinking more than this), low sodium diet, elevating lower extremities.   Discussed pursed lip breathing for instances of shortness of breath which occur both at rest and with activity.   Daily weight, report weight gain of 2lb overnight or 5lb in 1 week.   2. Lower extremity edema - Likely multifactorial heart failure, RLE wound, venous insufficiency. Encouraged <2L fluid per day (presently drinking 6 large bottles propel daily), low salt diet, elevating lower extremities.   3. HTN - BP not at goal. Increase Hydralazine to 50mg  TID. He will monitor and keep log at home. Goal BP <130/80.   4. CAD s/p CABG - Reports no chest pain. Shortness of breath likely due to HF exacerbation. EKG without acute changes. Continue GDMT  beta blocker, Repatha, Zetia. No statin secondary to intolerance.  5. DM2 - 05/19/19 A1c 11.1. Follows with his PCP. Low carbohydrate diet recommended.   6. CKD III - Careful titration of diuretics and antihypertensives. BMET today.  May require Lasix dose of 40mg  rather than 20mg  multiple times throughout the day. As above, await lab results prior to titration of diuretics for protection of renal function.   7. HLD, LDL goal <70 - Statin intolerant. Continue Repatha and Zetia.   Disposition: Follow up in 2 week(s) with Dr. Rockey Situ or APP via virtual visit.   Loel Dubonnet, NP 07/26/2019, 9:55 AM

## 2019-07-26 ENCOUNTER — Ambulatory Visit (INDEPENDENT_AMBULATORY_CARE_PROVIDER_SITE_OTHER): Payer: 59 | Admitting: Family

## 2019-07-26 ENCOUNTER — Telehealth: Payer: Self-pay | Admitting: Cardiovascular Disease

## 2019-07-26 ENCOUNTER — Telehealth: Payer: Self-pay

## 2019-07-26 ENCOUNTER — Encounter: Payer: Self-pay | Admitting: Family

## 2019-07-26 ENCOUNTER — Other Ambulatory Visit: Payer: Self-pay

## 2019-07-26 VITALS — BP 170/80 | HR 67 | Ht 74.0 in | Wt 224.2 lb

## 2019-07-26 DIAGNOSIS — R0600 Dyspnea, unspecified: Secondary | ICD-10-CM | POA: Diagnosis not present

## 2019-07-26 DIAGNOSIS — E118 Type 2 diabetes mellitus with unspecified complications: Secondary | ICD-10-CM | POA: Diagnosis not present

## 2019-07-26 DIAGNOSIS — I25118 Atherosclerotic heart disease of native coronary artery with other forms of angina pectoris: Secondary | ICD-10-CM

## 2019-07-26 DIAGNOSIS — R600 Localized edema: Secondary | ICD-10-CM

## 2019-07-26 DIAGNOSIS — Z794 Long term (current) use of insulin: Secondary | ICD-10-CM

## 2019-07-26 DIAGNOSIS — R0609 Other forms of dyspnea: Secondary | ICD-10-CM

## 2019-07-26 MED ORDER — HYDRALAZINE HCL 50 MG PO TABS: 50.0000 mg | ORAL_TABLET | Freq: Three times a day (TID) | ORAL | 6 refills | Status: DC

## 2019-07-26 NOTE — Telephone Encounter (Signed)
Confirmed appointment for 07/28/2019 and screened for covid. klh

## 2019-07-26 NOTE — Telephone Encounter (Signed)

## 2019-07-26 NOTE — Patient Instructions (Signed)
Medication Instructions:  1- INCREASE Hydralazine Take 1 tablet (50 mg total) by mouth 3 (three) times daily *If you need a refill on your cardiac medications before your next appointment, please call your pharmacy*   Lab Work: Your physician recommends that you have lab work today(BMET, BNP)  If you have labs (blood work) drawn today and your tests are completely normal, you will receive your results only by: Marland Kitchen MyChart Message (if you have MyChart) OR . A paper copy in the mail If you have any lab test that is abnormal or we need to change your treatment, we will call you to review the results.   Testing/Procedures: None ordered    Follow-Up: At [facility], you and your health needs are our priority.  As part of our continuing mission to provide you with exceptional heart care, we have created designated Provider Care Teams.  These Care Teams include your primary Cardiologist (physician) and Advanced Practice Providers (APPs -  Physician Assistants and Nurse Practitioners) who all work together to provide you with the care you need, when you need it.  We recommend signing up for the patient portal called "MyChart".  Sign up information is provided on this After Visit Summary.  MyChart is used to connect with patients for Virtual Visits (Telemedicine).  Patients are able to view lab/test results, encounter notes, upcoming appointments, etc.  Non-urgent messages can be sent to your provider as well.   To learn more about what you can do with MyChart, go to [email or web address].    Your next appointment:   2 week(s)  The format for your next appointment:   Virtual Visit   Provider:    You may see Dr. Rockey Situ or one of the following Advanced Practice Providers on your designated Care Team:    Murray Hodgkins, NP  Christell Faith, PA-C  Marrianne Mood, PA-C

## 2019-07-27 ENCOUNTER — Telehealth: Payer: Self-pay

## 2019-07-27 DIAGNOSIS — I25118 Atherosclerotic heart disease of native coronary artery with other forms of angina pectoris: Secondary | ICD-10-CM

## 2019-07-27 LAB — BASIC METABOLIC PANEL
BUN/Creatinine Ratio: 20 (ref 10–24)
BUN: 44 mg/dL — ABNORMAL HIGH (ref 8–27)
CO2: 19 mmol/L — ABNORMAL LOW (ref 20–29)
Calcium: 8.7 mg/dL (ref 8.6–10.2)
Chloride: 104 mmol/L (ref 96–106)
Creatinine, Ser: 2.18 mg/dL — ABNORMAL HIGH (ref 0.76–1.27)
GFR calc Af Amer: 37 mL/min/{1.73_m2} — ABNORMAL LOW (ref >59)
GFR calc non Af Amer: 32 mL/min/{1.73_m2} — ABNORMAL LOW (ref >59)
Glucose: 320 mg/dL — ABNORMAL HIGH (ref 65–99)
Potassium: 5.4 mmol/L — ABNORMAL HIGH (ref 3.5–5.2)
Sodium: 135 mmol/L (ref 134–144)

## 2019-07-27 LAB — BRAIN NATRIURETIC PEPTIDE: BNP: 347.5 pg/mL — ABNORMAL HIGH (ref 0.0–100.0)

## 2019-07-27 MED ORDER — FUROSEMIDE 20 MG PO TABS: 40.0000 mg | ORAL_TABLET | Freq: Every day | ORAL | 3 refills | Status: DC

## 2019-07-27 NOTE — Telephone Encounter (Signed)
Thank you for calling! I appreciate it!

## 2019-07-27 NOTE — Telephone Encounter (Signed)
Call to patient to discuss lab results and POC.   Pt verbalized understanding and Rx updated per request.   Lab order placed for repeat [facility] on Friday.   Will reach out to Dr. Trish Mage office for records.   Advised pt to call for any further questions or concerns.    Update: made call to Dr. Trish Mage office. They do not have a BMET on file for patient.

## 2019-07-27 NOTE — Telephone Encounter (Signed)
-----   Message from [name] Dubonnet, NP sent at 07/27/2019  9:30 AM EST ----- Kidney function is poor. Known CKD3 but also likely worse due to his recently self-increased Lasix. No recent labs available for comparison. Potassium mildly elevated. Please request recent labs from PCP for comparison.  Please have him take Lasix 40mg  daily (2 of his 20mg  tablets) in the morning. Limit to 2L fluid per day.   If unable to compare renal function to any recent labs, will plan to recheck BMP Friday. If able to get records, may be able to recheck next week.   Await result of BNP.

## 2019-07-28 ENCOUNTER — Other Ambulatory Visit: Payer: Self-pay

## 2019-07-28 ENCOUNTER — Telehealth: Payer: Self-pay

## 2019-07-28 ENCOUNTER — Encounter: Payer: Self-pay | Admitting: Adult Health

## 2019-07-28 ENCOUNTER — Ambulatory Visit: Payer: 59 | Admitting: Adult Health

## 2019-07-28 VITALS — BP 120/56 | HR 63 | Temp 97.1°F | Resp 16 | Ht 73.0 in | Wt 219.0 lb

## 2019-07-28 DIAGNOSIS — I10 Essential (primary) hypertension: Secondary | ICD-10-CM | POA: Diagnosis not present

## 2019-07-28 DIAGNOSIS — I509 Heart failure, unspecified: Secondary | ICD-10-CM

## 2019-07-28 DIAGNOSIS — K219 Gastro-esophageal reflux disease without esophagitis: Secondary | ICD-10-CM

## 2019-07-28 DIAGNOSIS — R600 Localized edema: Secondary | ICD-10-CM

## 2019-07-28 DIAGNOSIS — R0609 Other forms of dyspnea: Secondary | ICD-10-CM

## 2019-07-28 DIAGNOSIS — E1140 Type 2 diabetes mellitus with diabetic neuropathy, unspecified: Secondary | ICD-10-CM

## 2019-07-28 DIAGNOSIS — G4733 Obstructive sleep apnea (adult) (pediatric): Secondary | ICD-10-CM | POA: Diagnosis not present

## 2019-07-28 DIAGNOSIS — I25118 Atherosclerotic heart disease of native coronary artery with other forms of angina pectoris: Secondary | ICD-10-CM

## 2019-07-28 DIAGNOSIS — L8992 Pressure ulcer of unspecified site, stage 2: Secondary | ICD-10-CM

## 2019-07-28 DIAGNOSIS — Z794 Long term (current) use of insulin: Secondary | ICD-10-CM

## 2019-07-28 NOTE — Telephone Encounter (Signed)
-----   Message from Loel Dubonnet, NP sent at 07/28/2019  1:02 PM EST ----- BNP shows elevated fluid volume. We have adjusted his Lasix to 40mg  in the AM (see previous result note/telephone encounter). Can we add a BNP onto his BMP for Friday, please?

## 2019-07-28 NOTE — Telephone Encounter (Signed)
BNP added on to lab request that was already requested for Friday 3/5.

## 2019-07-28 NOTE — Addendum Note (Signed)
Addended by: Resa Miner I on: 07/28/2019 01:35 PM   Modules accepted: Orders

## 2019-07-28 NOTE — Progress Notes (Signed)
[facility] [place], [place] [address]  Internal MEDICINE  Office Visit Note  Patient Name: [name] [name]  [id number]  [id number]  Date of Service: 07/28/2019  Chief Complaint  Patient presents with  . Follow-up    short of breath walking short distances   . Diabetes  . Gastroesophageal Reflux  . Hyperlipidemia  . Hypertension  . Sleep Apnea    HPI Pt is seen today for follow up on DM, GERD, HLD, HTN and sleep apnea. He saw cardiology recently.  Echo shows EF 45%.  His BNP was 347.  He lasix was doubled by cardiology. He is complaining of increased sob, and we discussed that his echo, and state of his heart at present would cause this.   He reports a history of sleep apnea, he does not wear a cpap machine.  He does not feel like his machine gives him enough air.  He has gained 30 pounds since getting his machine 7-8 years ago.  He would need a new titration study to evaluate for optimal pressure. He will talk to his wife about doing this.  He is seen endocrinology for his DM.  His blood sugars remain elevated.  His diastolic BP is low today in office.  He Denies Chest pain, Shortness of breath, palpitations, headache, or blurred vision. His GERD is well controlled currently.     Current Medication: Outpatient Encounter Medications as of 07/28/2019  Medication Sig  . carvedilol (COREG) 12.5 MG tablet Take 12.5 mg by mouth 2 (two) times daily with a meal.  . Evolocumab (REPATHA SURECLICK) [id number] MG/ML SOAJ Inject 140 mg into the skin every 14 (fourteen) days.  Marland Kitchen ezetimibe (ZETIA) 10 MG tablet Take 10 mg by mouth daily.  . furosemide (LASIX) 20 MG tablet Take 2 tablets (40 mg total) by mouth daily.  . hydrALAZINE (APRESOLINE) 50 MG tablet Take 1 tablet (50 mg total) by mouth 3 (three) times daily.  . insulin degludec (TRESIBA FLEXTOUCH) 100 UNIT/ML SOPN FlexTouch Pen Inject 140 Units into the skin daily.  . insulin lispro (HUMALOG KWIKPEN) 100 UNIT/ML KwikPen  Inject into the skin. 0-20 units per sliding scale  . pantoprazole (PROTONIX) 40 MG tablet Take 40 mg by mouth daily.  . sacubitril-valsartan (ENTRESTO) 24-26 MG Take 1 tablet by mouth 2 (two) times daily.   No facility-administered encounter medications on file as of 07/28/2019.    Surgical History: Past Surgical History:  Procedure Laterality Date  . bone graft surgery    . CARDIAC CATHETERIZATION  2013   S/p PCI   . CARDIAC CATHETERIZATION  2018   S/p CABG  . CARDIAC SURGERY    . CORONARY ARTERY BYPASS GRAFT  2018   (LIMA-LAD,VG-RCA,VG-OM1,VG-D1)  . EYE SURGERY    . PERIPHERAL VASCULAR CATHETERIZATION Right 10/28/2016   PTA/DEB Right SFA  . WRIST SURGERY      Medical History: Past Medical History:  Diagnosis Date  . Arrhythmia   . Arthritis   . CAD (coronary artery disease)   . Cardiomyopathy, ischemic   . CHF (congestive heart failure) (HCC)    NYHA II-III  . Colon polyps   . Diabetes ([place])   . Dupre's syndrome   . GERD (gastroesophageal reflux disease)   . Hyperlipidemia   . Hypertension   . Migraine   . Neuropathy   . Peripheral vascular disease ([place])   . Retinopathy   . Sleep apnea   . Stage 3 chronic kidney disease   . Stomach  ulcer     Family History: Family History  Problem Relation Age of Onset  . Diabetes Mother   . Heart disease Father     Social History   Socioeconomic History  . Marital status: Married    Spouse name: Not on file  . Number of children: Not on file  . Years of education: Not on file  . Highest education level: Not on file  Occupational History  . Not on file  Tobacco Use  . Smoking status: Former Smoker    Types: Cigarettes    Quit date: 05/18/2012    Years since quitting: 7.1  . Smokeless tobacco: Never Used  Substance and Sexual Activity  . Alcohol use: Yes    Comment: occasional   . Drug use: Never  . Sexual activity: Not on file  Other Topics Concern  . Not on file  Social History Narrative  . Not on file    Social Determinants of Health   Financial Resource Strain:   . Difficulty of Paying Living Expenses: Not on file  Food Insecurity:   . Worried About Charity fundraiser in the Last Year: Not on file  . Ran Out of Food in the Last Year: Not on file  Transportation Needs:   . Lack of Transportation (Medical): Not on file  . Lack of Transportation (Non-Medical): Not on file  Physical Activity:   . Days of Exercise per Week: Not on file  . Minutes of Exercise per Session: Not on file  Stress:   . Feeling of Stress : Not on file  Social Connections:   . Frequency of Communication with Friends and Family: Not on file  . Frequency of Social Gatherings with Friends and Family: Not on file  . Attends Religious Services: Not on file  . Active Member of Clubs or Organizations: Not on file  . Attends Archivist Meetings: Not on file  . Marital Status: Not on file  Intimate Partner Violence:   . Fear of Current or Ex-Partner: Not on file  . Emotionally Abused: Not on file  . Physically Abused: Not on file  . Sexually Abused: Not on file      Review of Systems  Constitutional: Negative.  Negative for chills, fatigue and unexpected weight change.  HENT: Negative.  Negative for congestion, rhinorrhea, sneezing and sore throat.   Eyes: Negative for redness.  Respiratory: Negative.  Negative for cough, chest tightness and shortness of breath.   Cardiovascular: Negative.  Negative for chest pain and palpitations.  Gastrointestinal: Negative.  Negative for abdominal pain, constipation, diarrhea, nausea and vomiting.  Endocrine: Negative.   Genitourinary: Negative.  Negative for dysuria and frequency.  Musculoskeletal: Negative.  Negative for arthralgias, back pain, joint swelling and neck pain.  Skin: Negative.  Negative for rash.  Allergic/Immunologic: Negative.   Neurological: Negative.  Negative for tremors and numbness.  Hematological: Negative for adenopathy. Does not  bruise/bleed easily.  Psychiatric/Behavioral: Negative.  Negative for behavioral problems, sleep disturbance and suicidal ideas. The patient is not nervous/anxious.     Vital Signs: BP (!) 120/56   Pulse 63   Temp (!) 97.1 F (36.2 C)   Resp 16   Ht 6\' 1"  (1.854 m)   Wt 219 lb (99.3 kg)   SpO2 96%   BMI 28.89 kg/m    Physical Exam Vitals and nursing note reviewed.  Constitutional:      General: He is not in acute distress.    Appearance: He  is well-developed. He is not diaphoretic.  HENT:     Head: Normocephalic and atraumatic.     Mouth/Throat:     Pharynx: No oropharyngeal exudate.  Eyes:     Pupils: Pupils are equal, round, and reactive to light.  Neck:     Thyroid: No thyromegaly.     Vascular: No JVD.     Trachea: No tracheal deviation.  Cardiovascular:     Rate and Rhythm: Normal rate and regular rhythm.     Heart sounds: Normal heart sounds. No murmur. No friction rub. No gallop.   Pulmonary:     Effort: Pulmonary effort is normal. No respiratory distress.     Breath sounds: Normal breath sounds. No wheezing or rales.  Chest:     Chest wall: No tenderness.  Abdominal:     Palpations: Abdomen is soft.     Tenderness: There is no abdominal tenderness. There is no guarding.  Musculoskeletal:        General: Normal range of motion.     Cervical back: Normal range of motion and neck supple.  Lymphadenopathy:     Cervical: No cervical adenopathy.  Skin:    General: Skin is warm and dry.  Neurological:     Mental Status: He is alert and oriented to person, place, and time.     Cranial Nerves: No cranial nerve deficit.  Psychiatric:        Behavior: Behavior normal.        Thought Content: Thought content normal.        Judgment: Judgment normal.    Assessment/Plan: 1. Essential hypertension Blood pressure today 120/56.  Denies any current issues we will continue to follow.  2. OSA (obstructive sleep apnea) Discussed new titration study as he does not  feel like he receives adequate oxygen from his current machine settings.  He will discuss with wife and let us know.  3. Other congestive heart failure ([place]) Continue present management.  Lasix was doubled.  Continue to monitor  4. Type 2 diabetes mellitus with diabetic neuropathy, with long-term current use of insulin ([place]) Continue to follow endocrinology's recommendations.  5. Gastroesophageal reflux disease without esophagitis Stable, no new or worsening symptoms.  6. Pressure injury, stage 2, with suspected deep tissue injury ([place]) Continue to follow wound care orders.  General Counseling: motty spano understanding of the findings of todays visit and agrees with plan of treatment. I have discussed any further diagnostic evaluation that may be needed or ordered today. We also reviewed his medications today. he has been encouraged to call the office with any questions or concerns that should arise related to todays visit.    No orders of the defined types were placed in this encounter.   No orders of the defined types were placed in this encounter.   Time spent: 25 Minutes   This patient was seen by Orson Gear AGNP-C in Collaboration with Dr [name] as a part of collaborative care agreement     Kendell Bane AGNP-C Internal medicine

## 2019-07-29 ENCOUNTER — Encounter: Payer: 59 | Attending: Physician Assistant | Admitting: Physician Assistant

## 2019-07-29 DIAGNOSIS — E11621 Type 2 diabetes mellitus with foot ulcer: Secondary | ICD-10-CM | POA: Diagnosis not present

## 2019-07-29 DIAGNOSIS — N183 Chronic kidney disease, stage 3 unspecified: Secondary | ICD-10-CM | POA: Insufficient documentation

## 2019-07-29 DIAGNOSIS — I2510 Atherosclerotic heart disease of native coronary artery without angina pectoris: Secondary | ICD-10-CM | POA: Diagnosis not present

## 2019-07-29 DIAGNOSIS — L97512 Non-pressure chronic ulcer of other part of right foot with fat layer exposed: Secondary | ICD-10-CM | POA: Insufficient documentation

## 2019-07-29 DIAGNOSIS — E1122 Type 2 diabetes mellitus with diabetic chronic kidney disease: Secondary | ICD-10-CM | POA: Insufficient documentation

## 2019-07-29 DIAGNOSIS — L84 Corns and callosities: Secondary | ICD-10-CM | POA: Diagnosis not present

## 2019-07-29 DIAGNOSIS — I130 Hypertensive heart and chronic kidney disease with heart failure and stage 1 through stage 4 chronic kidney disease, or unspecified chronic kidney disease: Secondary | ICD-10-CM | POA: Insufficient documentation

## 2019-07-29 DIAGNOSIS — I5042 Chronic combined systolic (congestive) and diastolic (congestive) heart failure: Secondary | ICD-10-CM | POA: Insufficient documentation

## 2019-07-29 DIAGNOSIS — E1140 Type 2 diabetes mellitus with diabetic neuropathy, unspecified: Secondary | ICD-10-CM | POA: Insufficient documentation
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
## 2019-07-30 ENCOUNTER — Other Ambulatory Visit
Admission: RE | Admit: 2019-07-30 | Discharge: 2019-07-30 | Disposition: A | Discharge status: Home / Self Care | Payer: 59 | Source: Ambulatory Visit | Attending: Family | Admitting: Family

## 2019-07-30 ENCOUNTER — Telehealth: Payer: Self-pay | Admitting: Family

## 2019-07-30 DIAGNOSIS — N1832 Chronic kidney disease, stage 3b: Secondary | ICD-10-CM

## 2019-07-30 DIAGNOSIS — R0609 Other forms of dyspnea: Secondary | ICD-10-CM

## 2019-07-30 DIAGNOSIS — R0600 Dyspnea, unspecified: Secondary | ICD-10-CM | POA: Insufficient documentation

## 2019-07-30 DIAGNOSIS — I25118 Atherosclerotic heart disease of native coronary artery with other forms of angina pectoris: Secondary | ICD-10-CM | POA: Diagnosis present

## 2019-07-30 DIAGNOSIS — R600 Localized edema: Secondary | ICD-10-CM | POA: Insufficient documentation

## 2019-07-30 DIAGNOSIS — I255 Ischemic cardiomyopathy: Secondary | ICD-10-CM

## 2019-07-30 LAB — BRAIN NATRIURETIC PEPTIDE: B Natriuretic Peptide: 367 pg/mL — ABNORMAL HIGH (ref 0.0–100.0)

## 2019-07-30 LAB — BASIC METABOLIC PANEL
Anion gap: 7 (ref 5–15)
BUN: 42 mg/dL — ABNORMAL HIGH (ref 6–20)
CO2: 20 mmol/L — ABNORMAL LOW (ref 22–32)
Calcium: 8.3 mg/dL — ABNORMAL LOW (ref 8.9–10.3)
Chloride: 107 mmol/L (ref 98–111)
Creatinine, Ser: 2.41 mg/dL — ABNORMAL HIGH (ref 0.61–1.24)
GFR calc Af Amer: 33 mL/min — ABNORMAL LOW (ref >60)
GFR calc non Af Amer: 28 mL/min — ABNORMAL LOW (ref >60)
Glucose, Bld: 224 mg/dL — ABNORMAL HIGH (ref 70–99)
Potassium: 5.1 mmol/L (ref 3.5–5.1)
Sodium: 134 mmol/L — ABNORMAL LOW (ref 135–145)

## 2019-07-30 MED ORDER — FUROSEMIDE 20 MG PO TABS: 40.0000 mg | ORAL_TABLET | Freq: Two times a day (BID) | ORAL | 3 refills | Status: DC

## 2019-07-30 NOTE — Telephone Encounter (Signed)
Called to review BMP and BNP with patient.   Renal function declined. 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 Potassium improved. Na mildly low. BNP shows continued fluid retention.   Recommended he proceed to ED for IV diuresis and careful monitoring of renal function. He declines. Educated to report to ED if new or worsening shortness of breath, chest pain, or if he has worsening LE edema. He verbalized understanding. Reassurance provided that new safety and cleaning protocols were in place.   As he declines ED evaluation: Stop Entresto, change Lasix to 40mg  BID, and repeat BMP/BNP Monday at [facility]. He verbalized understanding of these instructions. Educated to restrict to less than 2L fluid per day. We discussed the risks of his renal function worsening over the weekend.   He has follow up with Dr. Rockey Situ 08/09/19. May require referral to nephrology in the future if volume management is difficult. Anticipate worsening labs presently are secondary to cardiorenal syndrome.   Loel Dubonnet, NP

## 2019-08-02 ENCOUNTER — Other Ambulatory Visit
Admission: RE | Admit: 2019-08-02 | Discharge: 2019-08-02 | Disposition: A | Discharge status: Home / Self Care | Payer: 59 | Source: Ambulatory Visit | Attending: Family | Admitting: Family

## 2019-08-02 ENCOUNTER — Telehealth: Payer: Self-pay | Admitting: Cardiovascular Disease

## 2019-08-02 DIAGNOSIS — I25118 Atherosclerotic heart disease of native coronary artery with other forms of angina pectoris: Secondary | ICD-10-CM | POA: Insufficient documentation

## 2019-08-02 DIAGNOSIS — R0609 Other forms of dyspnea: Secondary | ICD-10-CM

## 2019-08-02 DIAGNOSIS — R0600 Dyspnea, unspecified: Secondary | ICD-10-CM | POA: Insufficient documentation

## 2019-08-02 LAB — BASIC METABOLIC PANEL
Anion gap: 10 (ref 5–15)
BUN: 48 mg/dL — ABNORMAL HIGH (ref 6–20)
CO2: 21 mmol/L — ABNORMAL LOW (ref 22–32)
Calcium: 8.6 mg/dL — ABNORMAL LOW (ref 8.9–10.3)
Chloride: 108 mmol/L (ref 98–111)
Creatinine, Ser: 2.42 mg/dL — ABNORMAL HIGH (ref 0.61–1.24)
GFR calc Af Amer: 32 mL/min — ABNORMAL LOW (ref >60)
GFR calc non Af Amer: 28 mL/min — ABNORMAL LOW (ref >60)
Glucose, Bld: 128 mg/dL — ABNORMAL HIGH (ref 70–99)
Potassium: 5 mmol/L (ref 3.5–5.1)
Sodium: 139 mmol/L (ref 135–145)

## 2019-08-02 LAB — BRAIN NATRIURETIC PEPTIDE: B Natriuretic Peptide: 373 pg/mL — ABNORMAL HIGH (ref 0.0–100.0)

## 2019-08-02 NOTE — Telephone Encounter (Signed)
Patient waiting at [facility].  Orders not entered
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
## 2019-08-02 NOTE — Telephone Encounter (Signed)
Reviewed labs with patient. Overall stable compared to Friday, but still with decreased renal function.   Scheduled for office visit 08/03/19 with Dr. Rockey Situ at [facility]. Continue Lasix 40mg  daily, continue to hold Entresto.   Reports shortness of breath is better. Reports still has swelling that is worse in right leg and in left foot.   Loel Dubonnet, NP

## 2019-08-02 NOTE — Telephone Encounter (Signed)
Orders entered and he will have them done at the [facility].

## 2019-08-02 NOTE — Telephone Encounter (Signed)
Labs entered for patient to have drawn.

## 2019-08-03 ENCOUNTER — Encounter (INDEPENDENT_AMBULATORY_CARE_PROVIDER_SITE_OTHER): Payer: Self-pay

## 2019-08-03 ENCOUNTER — Ambulatory Visit (INDEPENDENT_AMBULATORY_CARE_PROVIDER_SITE_OTHER): Payer: 59 | Admitting: Cardiovascular Disease

## 2019-08-03 ENCOUNTER — Other Ambulatory Visit: Payer: Self-pay

## 2019-08-03 ENCOUNTER — Encounter: Payer: Self-pay | Admitting: Cardiovascular Disease

## 2019-08-03 VITALS — BP 150/72 | HR 59 | Ht 74.0 in | Wt 222.0 lb

## 2019-08-03 DIAGNOSIS — E118 Type 2 diabetes mellitus with unspecified complications: Secondary | ICD-10-CM

## 2019-08-03 DIAGNOSIS — Z794 Long term (current) use of insulin: Secondary | ICD-10-CM

## 2019-08-03 DIAGNOSIS — I255 Ischemic cardiomyopathy: Secondary | ICD-10-CM | POA: Diagnosis not present

## 2019-08-03 DIAGNOSIS — N1832 Chronic kidney disease, stage 3b: Secondary | ICD-10-CM | POA: Diagnosis not present

## 2019-08-03 DIAGNOSIS — R0600 Dyspnea, unspecified: Secondary | ICD-10-CM

## 2019-08-03 DIAGNOSIS — R600 Localized edema: Secondary | ICD-10-CM

## 2019-08-03 DIAGNOSIS — R0609 Other forms of dyspnea: Secondary | ICD-10-CM

## 2019-08-03 DIAGNOSIS — I25118 Atherosclerotic heart disease of native coronary artery with other forms of angina pectoris: Secondary | ICD-10-CM

## 2019-08-03 MED ORDER — TORSEMIDE 20 MG PO TABS: 20.0000 mg | ORAL_TABLET | Freq: Two times a day (BID) | ORAL | 3 refills | Status: DC

## 2019-08-03 NOTE — Patient Instructions (Addendum)
Medication Instructions:  Your physician has recommended you make the following change in your medication:  1. STOP Furosemide (Lasix) 2. START Torsemide 20 mg twice a day   If you need a refill on your cardiac medications before your next appointment, please call your pharmacy.    Lab work: BMP in 2-3 weeks here in our office   If you have labs (blood work) drawn today and your tests are completely normal, you will receive your results only by: Marland Kitchen MyChart Message (if you have MyChart) OR . A paper copy in the mail If you have any lab test that is abnormal or we need to change your treatment, we will call you to review the results.   Testing/Procedures: We will order an echo for shortness of breath, leg edema Your physician has requested that you have an echocardiogram. Echocardiography is a painless test that uses sound waves to create images of your heart. It provides your doctor with information about the size and shape of your heart and how well your heart's chambers and valves are working. This procedure takes approximately one hour. There are no restrictions for this procedure.  Right LE venous doppler, rule out DVT Your physician has requested that you have a lower or upper extremity venous duplex. This test is an ultrasound of the veins in the legs or arms. It looks at venous blood flow that carries blood from the heart to the legs or arms. Allow one hour for a Lower Venous exam. Allow thirty minutes for an Upper Venous exam. There are no restrictions or special instructions.    Follow-Up: At [facility], you and your health needs are our priority.  As part of our continuing mission to provide you with exceptional heart care, we have created designated Provider Care Teams.  These Care Teams include your primary Cardiologist (physician) and Advanced Practice Providers (APPs -  Physician Assistants and Nurse Practitioners) who all work together to provide you with the care you  need, when you need it.  . You will need a follow up appointment in 1 month   . Providers on your designated Care Team:   . Murray Hodgkins, NP . Christell Faith, PA-C . Marrianne Mood, PA-C  Any Other Special Instructions Will Be Listed Below (If Applicable).  For educational health videos Log in to : [email or web address] Or : [email or web address], password : [email or web address]

## 2019-08-05 ENCOUNTER — Other Ambulatory Visit: Payer: Self-pay

## 2019-08-05 ENCOUNTER — Encounter: Payer: 59 | Admitting: Physician Assistant

## 2019-08-05 DIAGNOSIS — E11621 Type 2 diabetes mellitus with foot ulcer: Secondary | ICD-10-CM | POA: Diagnosis not present

## 2019-08-05 NOTE — Progress Notes (Signed)
[name] [name], [name] [name] ([id number]) Visit Report for 08/05/2019 Arrival Information Details Patient Name: [name] [name], [name] [name] Date of Service: 08/05/2019 9:30 AM Medical Record Number: [id number] Patient Account Number: [id number] Date of Birth/Sex: 02/28/1961 (59 y.o. M) Treating RN: [name] Primary Care Infinity Jeffords: [name] Other Clinician: Referring Byrd Terrero: [name] Treating Mitchael Luckey/Extender: [name], [name]: 2 Visit Information History Since Last Visit Added or deleted any medications: No Patient Arrived: Cane Any new allergies or adverse reactions: No Arrival Time: 09:40 Had a fall or experienced change in No Accompanied By: self activities of daily living that may affect Transfer Assistance: None risk of falls: Patient Identification Verified: Yes Signs or symptoms of abuse/neglect since last visito No Secondary Verification Process Completed: Yes Hospitalized since last visit: No Implantable device outside of the clinic excluding No cellular tissue based products placed in the center since last visit: Has Dressing in Place as Prescribed: Yes Pain Present Now: No Electronic Signature(s) Signed: 08/05/2019 3:53:04 PM By: Lorine Bears RCP, RRT, CHT Entered By: Lorine Bears on 08/05/2019 09:43:53 [name] [name] ([id number]) -------------------------------------------------------------------------------- Clinic Level of Care Assessment Details Patient Name: [name] [name] Date of Service: 08/05/2019 9:30 AM Medical Record Number: [id number] Patient Account Number: [id number] Date of Birth/Sex: 1960-07-31 (58 y.o. M) Treating RN: [name] Primary Care Haadi Santellan: [name] Other Clinician: Referring Dominick Morella: [name] Treating Yasir Kitner/Extender: [name], [name]: 2 Clinic Level of Care Assessment Items TOOL 4 Quantity Score []  - Use when only an EandM is performed on FOLLOW-UP visit  0 ASSESSMENTS - Nursing Assessment / Reassessment X - Reassessment of Co-morbidities (includes updates in patient status) 1 10 X- 1 5 Reassessment of Adherence to Treatment Plan ASSESSMENTS - Wound and Skin Assessment / Reassessment []  - Simple Wound Assessment / Reassessment - one wound 0 X- 2 5 Complex Wound Assessment / Reassessment - multiple wounds []  - 0 Dermatologic / Skin Assessment (not related to wound area) ASSESSMENTS - Focused Assessment []  - Circumferential Edema Measurements - multi extremities 0 []  - 0 Nutritional Assessment / Counseling / Intervention []  - 0 Lower Extremity Assessment (monofilament, tuning fork, pulses) []  - 0 Peripheral Arterial Disease Assessment (using hand held doppler) ASSESSMENTS - Ostomy and/or Continence Assessment and Care []  - Incontinence Assessment and Management 0 []  - 0 Ostomy Care Assessment and Management (repouching, etc.) PROCESS - Coordination of Care X - Simple Patient / Family Education for ongoing care 1 15 []  - 0 Complex (extensive) Patient / Family Education for ongoing care []  - 0 Staff obtains Programmer, systems, Records, Test Results / Process Orders []  - 0 Staff telephones HHA, Nursing Homes / Clarify orders / etc []  - 0 Routine Transfer to another Facility (non-emergent condition) []  - 0 Routine Hospital Admission (non-emergent condition) []  - 0 New Admissions / Biomedical engineer / Ordering NPWT, Apligraf, etc. []  - 0 Emergency Hospital Admission (emergent condition) X- 1 10 Simple Discharge Coordination []  - 0 Complex (extensive) Discharge Coordination PROCESS - Special Needs []  - Pediatric / Minor Patient Management 0 []  - 0 Isolation Patient Management []  - 0 Hearing / Language / Visual special needs []  - 0 Assessment of Community assistance (transportation, D/C planning, etc.) [name] [name], [name] [name] ([id number]) []  - 0 Additional assistance / Altered mentation []  - 0 Support Surface(s) Assessment (bed,  cushion, seat, etc.) INTERVENTIONS - Wound Cleansing / Measurement []  - Simple Wound Cleansing - one wound 0 X- 2 5 Complex Wound Cleansing - multiple wounds X- 1 5 Wound Imaging (photographs -  any number of wounds) []  - 0 Wound Tracing (instead of photographs) []  - 0 Simple Wound Measurement - one wound X- 2 5 Complex Wound Measurement - multiple wounds INTERVENTIONS - Wound Dressings []  - Small Wound Dressing one or multiple wounds 0 X- 2 15 Medium Wound Dressing one or multiple wounds []  - 0 Large Wound Dressing one or multiple wounds []  - 0 Application of Medications - topical []  - 0 Application of Medications - injection INTERVENTIONS - Miscellaneous []  - External ear exam 0 []  - 0 Specimen Collection (cultures, biopsies, blood, body fluids, etc.) []  - 0 Specimen(s) / Culture(s) sent or taken to Lab for analysis []  - 0 Patient Transfer (multiple staff / Civil Service fast streamer / Similar devices) []  - 0 Simple Staple / Suture removal (25 or less) []  - 0 Complex Staple / Suture removal (26 or more) []  - 0 Hypo / Hyperglycemic Management (close monitor of Blood Glucose) []  - 0 Ankle / Brachial Index (ABI) - do not check if billed separately X- 1 5 Vital Signs Has the patient been seen at the hospital within the last three years: Yes Total Score: 110 Level Of Care: New/Established - Level 3 Electronic Signature(s) Signed: 08/05/2019 3:43:50 PM By: [name] Entered By: [name] on 08/05/2019 10:21:14 [name] [name] ([id number]) -------------------------------------------------------------------------------- Encounter Discharge Information Details Patient Name: [name] [name] Date of Service: 08/05/2019 9:30 AM Medical Record Number: [id number] Patient Account Number: [id number] Date of Birth/Sex: [birth year] (59 y.o. M) Treating RN: [name] Primary Care Jakhi Dishman: [name] Other Clinician: Referring Tinzlee Craker: [name] Treating Chapin Arduini/Extender: [name],  [name]: 2 Encounter Discharge Information Items Discharge Condition: Stable Ambulatory Status: Ambulatory Discharge Destination: Home Transportation: Private Auto Accompanied By: self Schedule Follow-up Appointment: Yes Clinical Summary of Care: Electronic Signature(s) Signed: 08/05/2019 3:43:50 PM By: [name] Entered By: [name] on 08/05/2019 10:22:18 [name] [name] ([id number]) -------------------------------------------------------------------------------- Lower Extremity Assessment Details Patient Name: [name] [name] Date of Service: 08/05/2019 9:30 AM Medical Record Number: [id number] Patient Account Number: [id number] Date of Birth/Sex: December 11, 1960 (58 y.o. M) Treating RN: [name] Primary Care Ahrianna Siglin: [name] Other Clinician: Referring Minahil Quinlivan: [name] Treating Toluwani Yadav/Extender: [name], [name]: 2 Edema Assessment Assessed: [Left: No] [Right: No] Edema: [Left: Ye] [Right: s] Vascular Assessment Pulses: Dorsalis Pedis Palpable: [Right:Yes] Electronic Signature(s) Signed: 08/05/2019 4:05:00 PM By: [name] Entered By: [name] on 08/05/2019 09:58:15 [name] [name] ([id number]) -------------------------------------------------------------------------------- Multi Wound Chart Details Patient Name: [name] [name] Date of Service: 08/05/2019 9:30 AM Medical Record Number: [id number] Patient Account Number: [id number] Date of Birth/Sex: March 04, 1961 (59 y.o. M) Treating RN: [name] Primary Care Rondall Radigan: [name] Other Clinician: Referring Kailah Pennel: [name] Treating Raia Amico/Extender: [name], [name]: 2 Vital Signs Height(in): 74 Pulse(bpm): 39 Weight(lbs): 221 Blood Pressure(mmHg): 128/58 Body Mass Index(BMI): 28 Temperature(F): 98.3 Respiratory Rate(breaths/min): 16 Photos: [N/A:N/A] Wound Location: Right Toe Great Right Foot - Plantar N/A Wounding  Event: Gradually Appeared Blister N/A Primary Etiology: Diabetic Wound/Ulcer of the Lower Diabetic Wound/Ulcer of the Lower N/A Extremity Extremity Comorbid History: Sleep Apnea, Congestive Heart Sleep Apnea, Congestive Heart N/A Failure, Coronary Artery Disease, Failure, Coronary Artery Disease, Hypertension, Peripheral Venous Hypertension, Peripheral Venous Disease, Type II Diabetes, History of Disease, Type II Diabetes, History of Burn, Osteoarthritis, Neuropathy Burn, Osteoarthritis, Neuropathy Date Acquired: 07/07/2019 07/18/2019 N/A Weeks of Treatment: 2 2 N/A Wound Status: Open Open N/A Pending Amputation on Yes Yes N/A Presentation: Measurements L x W x D (cm) 0.3x0.2x0.1 1.5x0.7x0.1 N/A  Area (cm) : 0.047 0.825 N/A Volume (cm) : 0.005 0.082 N/A % Reduction in Area: -51.60% 91.90% N/A % Reduction in Volume: 44.40% 92.00% N/A Classification: Grade 1 Grade 1 N/A Exudate Amount: Medium Medium N/A Exudate Type: Serous Serous N/A Exudate Color: amber amber N/A Wound Margin: Flat and Intact Flat and Intact N/A Granulation Amount: Large (67-100%) Large (67-100%) N/A Granulation Quality: Pink Pink N/A Necrotic Amount: Small (1-33%) None Present (0%) N/A Exposed Structures: Fat Layer (Subcutaneous Tissue) Fat Layer (Subcutaneous Tissue) N/A Exposed: Yes Exposed: Yes Fascia: No Fascia: No Tendon: No Tendon: No Muscle: No Muscle: No Joint: No Joint: No Bone: No Bone: No Epithelialization: None Medium (34-66%) N/A Treatment Notes [name] [name], [name] [name] ([id number]) Electronic Signature(s) Signed: 08/05/2019 3:43:50 PM By: [name] Entered By: [name] on 08/05/2019 10:18:36 [name] [name] ([id number]) -------------------------------------------------------------------------------- Multi-Disciplinary Care Plan Details Patient Name: [name] [name] Date of Service: 08/05/2019 9:30 AM Medical Record Number: [id number] Patient Account Number: [id number] Date of Birth/Sex:  1960/06/02 (58 y.o. M) Treating RN: [name] Primary Care Kaeli Nichelson: [name] Other Clinician: Referring Carley Strickling: [name] Treating Missie Gehrig/Extender: [name], [name]: 2 Active Inactive Orientation to the Wound Care Program Nursing Diagnoses: Knowledge deficit related to the wound healing center program Goals: Patient/caregiver will verbalize understanding of the [facility] Program Date Initiated: 07/22/2019 Target Resolution Date: 08/06/2019 Goal Status: Active Interventions: Provide education on orientation to the wound center Notes: Wound/Skin Impairment Nursing Diagnoses: Impaired tissue integrity Goals: Ulcer/skin breakdown will have a volume reduction of 30% by week 4 Date Initiated: 07/22/2019 Target Resolution Date: 08/17/2019 Goal Status: Active Interventions: Assess ulceration(s) every visit Notes: Electronic Signature(s) Signed: 08/05/2019 3:43:50 PM By: [name] Entered By: [name] on 08/05/2019 10:17:46 [name] [name] ([id number]) -------------------------------------------------------------------------------- Pain Assessment Details Patient Name: [name] [name] Date of Service: 08/05/2019 9:30 AM Medical Record Number: [id number] Patient Account Number: [id number] Date of Birth/Sex: 22-Jan-1961 (59 y.o. M) Treating RN: [name] Primary Care Henri Baumler: [name] Other Clinician: Referring Ibn Stief: [name] Treating Kaileena Obi/Extender: [name], [name]: 2 Active Problems Location of Pain Severity and Description of Pain Patient Has Paino No Site Locations Pain Management and Medication Current Pain Management: Electronic Signature(s) Signed: 08/05/2019 4:05:00 PM By: [name] Entered By: [name] on 08/05/2019 09:54:44 [name] [name] ([id number]) -------------------------------------------------------------------------------- Patient/Caregiver Education  Details Patient Name: [name] [name] Date of Service: 08/05/2019 9:30 AM Medical Record Number: [id number] Patient Account Number: [id number] Date of Birth/Gender: 20-Feb-1961 (58 y.o. M) Treating RN: [name] Primary Care Physician: [name] Other Clinician: Referring Physician: [name] Treating Physician/Extender: Sharalyn Ink in Treatment: 2 Education Assessment Education Provided To: Patient Education Topics Provided Wound/Skin Impairment: Handouts: Caring for Your Ulcer Methods: Demonstration, Explain/Verbal Responses: State content correctly Electronic Signature(s) Signed: 08/05/2019 3:43:50 PM By: [name] Entered By: [name] on 08/05/2019 10:21:26 [name] [name] ([id number]) -------------------------------------------------------------------------------- Wound Assessment Details Patient Name: [name] [name] Date of Service: 08/05/2019 9:30 AM Medical Record Number: [id number] Patient Account Number: [id number] Date of Birth/Sex: Sep 27, 1960 (58 y.o. M) Treating RN: [name] Primary Care Kaycen Whitworth: [name] Other Clinician: Referring Caysen Whang: [name] Treating [place] Ament/Extender: [name], [name]: 2 Wound Status Wound Number: 1 Primary Diabetic Wound/Ulcer of the Lower Extremity Etiology: Wound Location: Right Toe Great Wound Open Wounding Event: Gradually Appeared Status: Date Acquired: 07/07/2019 Comorbid Sleep Apnea, Congestive Heart Failure, Coronary Artery Weeks Of Treatment: 2 History: Disease, Hypertension, Peripheral Venous Disease, Type II Clustered Wound: No Diabetes, History of Burn, Osteoarthritis,  Neuropathy Pending Amputation On Presentation Photos Wound Measurements Length: (cm) 0.1 Width: (cm) 0.1 Depth: (cm) 0.1 Area: (cm) 0.008 Volume: (cm) 0.001 % Reduction in Area: 74.2% % Reduction in Volume: 88.9% Epithelialization: None Tunneling: No Undermining: No Wound  Description Classification: Grade 1 Wound Margin: Flat and Intact Exudate Amount: Medium Exudate Type: Serous Exudate Color: amber Foul Odor After Cleansing: No Slough/Fibrino Yes Wound Bed Granulation Amount: Large (67-100%) Exposed Structure Granulation Quality: Pink Fascia Exposed: No Necrotic Amount: Small (1-33%) Fat Layer (Subcutaneous Tissue) Exposed: Yes Necrotic Quality: Adherent Slough Tendon Exposed: No Muscle Exposed: No Joint Exposed: No Bone Exposed: No Treatment Notes Wound #1 (Right Toe Great) Notes Foam, coverlet to great toe, Scell, ABD conform, to plantar foot Electronic Signature(s) [name] [name], [name] [name] ([id number]) Signed: 08/05/2019 3:43:50 PM By: [name] Entered By: [name] on 08/05/2019 10:18:52 [name] [name] ([id number]) -------------------------------------------------------------------------------- Wound Assessment Details Patient Name: [name] [name] Date of Service: 08/05/2019 9:30 AM Medical Record Number: [id number] Patient Account Number: [id number] Date of Birth/Sex: 10/21/1960 (59 y.o. M) Treating RN: [name] Primary Care Omunique Pederson: [name] Other Clinician: Referring Kemet Nijjar: [name] Treating Maitri Schnoebelen/Extender: [name], [name]: 2 Wound Status Wound Number: 2 Primary Diabetic Wound/Ulcer of the Lower Extremity Etiology: Wound Location: Right, Plantar Foot Wound Open Wounding Event: Blister Status: Date Acquired: 07/18/2019 Comorbid Sleep Apnea, Congestive Heart Failure, Coronary Artery Weeks Of Treatment: 2 History: Disease, Hypertension, Peripheral Venous Disease, Type II Clustered Wound: No Diabetes, History of Burn, Osteoarthritis, Neuropathy Pending Amputation On Presentation Photos Wound Measurements Length: (cm) 1.5 Width: (cm) 0.7 Depth: (cm) 0.1 Area: (cm) 0.825 Volume: (cm) 0.082 % Reduction in Area: 91.9% % Reduction in Volume: 92% Epithelialization: Medium  (34-66%) Tunneling: No Undermining: No Wound Description Classification: Grade 1 Wound Margin: Flat and Intact Exudate Amount: Medium Exudate Type: Serous Exudate Color: amber Foul Odor After Cleansing: No Slough/Fibrino No Wound Bed Granulation Amount: Large (67-100%) Exposed Structure Granulation Quality: Pink Fascia Exposed: No Necrotic Amount: None Present (0%) Fat Layer (Subcutaneous Tissue) Exposed: Yes Tendon Exposed: No Muscle Exposed: No Joint Exposed: No Bone Exposed: No Treatment Notes Wound #2 (Right, Plantar Foot) Notes Foam, coverlet to great toe, Scell, ABD conform, to plantar foot Electronic Signature(s) [name] [name], [name] [name] ([id number]) Signed: 08/05/2019 3:43:50 PM By: [name] Entered By: [name] on 08/05/2019 10:18:56 [name] [name] ([id number]) -------------------------------------------------------------------------------- Vitals Details Patient Name: [name] [name] Date of Service: 08/05/2019 9:30 AM Medical Record Number: [id number] Patient Account Number: [id number] Date of Birth/Sex: Nov 10, 1960 (58 y.o. M) Treating RN: [name] Primary Care Taura Lamarre: [name] Other Clinician: Referring Siani Utke: [name] Treating Kennedy Brines/Extender: [name], [name]: 2 Vital Signs Time Taken: 09:44 Temperature (F): 98.3 Height (in): 74 Pulse (bpm): 52 Weight (lbs): 221 Respiratory Rate (breaths/min): 16 Body Mass Index (BMI): 28.4 Blood Pressure (mmHg): 128/58 Reference Range: 80 - 120 mg / dl Electronic Signature(s) Signed: 08/05/2019 3:53:04 PM By: Lorine Bears RCP, RRT, CHT Entered By: Lorine Bears on 08/05/2019 09:47:19
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
## 2019-08-09 ENCOUNTER — Telehealth: Payer: 59 | Admitting: Cardiovascular Disease

## 2019-08-12 ENCOUNTER — Encounter: Payer: 59 | Admitting: Physician Assistant

## 2019-08-12 ENCOUNTER — Other Ambulatory Visit: Payer: Self-pay

## 2019-08-12 DIAGNOSIS — E11621 Type 2 diabetes mellitus with foot ulcer: Secondary | ICD-10-CM | POA: Diagnosis not present
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
## 2019-08-13 NOTE — Progress Notes (Signed)
CAN, LUCCI ([id number]) Visit Report for 08/12/2019 Arrival Information Details Patient Name: [name] [name], [name] [name] Date of Service: 08/12/2019 9:30 AM Medical Record Number: [id number] Patient Account Number: [id number] Date of Birth/Sex: 1961-03-06 (59 y.o. M) Treating RN: Cornell Barman Primary Care Alicia Ackert: [name] Other Clinician: Referring Zakariah Urwin: [name] Treating Finnean Cerami/Extender: [name], [name]: 3 Visit Information History Since Last Visit Added or deleted any medications: No Patient Arrived: Cane Any new allergies or adverse reactions: No Arrival Time: 09:14 Had a fall or experienced change in No Accompanied By: self activities of daily living that may affect Transfer Assistance: None risk of falls: Patient Identification Verified: Yes Signs or symptoms of abuse/neglect since last visito No Secondary Verification Process Completed: Yes Hospitalized since last visit: No Implantable device outside of the clinic excluding No cellular tissue based products placed in the center since last visit: Has Dressing in Place as Prescribed: Yes Pain Present Now: No Electronic Signature(s) Signed: 08/12/2019 3:52:07 PM By: Lorine Bears RCP, RRT, CHT Entered By: Lorine Bears on 08/12/2019 09:20:18 [name] [name] ([id number]) -------------------------------------------------------------------------------- Encounter Discharge Information Details Patient Name: [name] [name] Date of Service: 08/12/2019 9:30 AM Medical Record Number: [id number] Patient Account Number: [id number] Date of Birth/Sex: 01-29-1961 (58 y.o. M) Treating RN: Cornell Barman Primary Care Stanislawa Gaffin: [name] Other Clinician: Referring Najmah Carradine: [name] Treating Raeqwon Lux/Extender: [name], [name]: 3 Encounter Discharge Information Items Post Procedure Vitals Discharge Condition: Stable Temperature (F): 98.2 Ambulatory Status:  Ambulatory Pulse (bpm): 60 Discharge Destination: Home Respiratory Rate (breaths/min): 16 Transportation: Private Auto Blood Pressure (mmHg): 178/68 Accompanied By: self Schedule Follow-up Appointment: Yes Clinical Summary of Care: Electronic Signature(s) Signed: 08/13/2019 5:21:44 PM By: Gretta Cool, BSN, RN, CWS, Kim RN, BSN Entered By: Gretta Cool, BSN, RN, CWS, Kim on 08/12/2019 09:39:59 [name] [name] ([id number]) -------------------------------------------------------------------------------- Lower Extremity Assessment Details Patient Name: [name] [name] Date of Service: 08/12/2019 9:30 AM Medical Record Number: [id number] Patient Account Number: [id number] Date of Birth/Sex: 04-14-1961 (58 y.o. M) Treating RN: [name] Primary Care Antrice Pal: [name] Other Clinician: Referring Damir Leung: [name] Treating Kamari Bilek/Extender: [name], [name]: 3 Edema Assessment Assessed: [Left: No] [Right: No] Edema: [Left: N] [Right: o] Vascular Assessment Pulses: Dorsalis Pedis Palpable: [Right:Yes] Electronic Signature(s) Signed: 08/12/2019 4:33:07 PM By: [name] Entered By: [name] on 08/12/2019 09:25:39 [name] [name] ([id number]) -------------------------------------------------------------------------------- Multi Wound Chart Details Patient Name: [name] [name] Date of Service: 08/12/2019 9:30 AM Medical Record Number: [id number] Patient Account Number: [id number] Date of Birth/Sex: 1960/08/04 (59 y.o. M) Treating RN: Cornell Barman Primary Care Mays Paino: [name] Other Clinician: Referring Kameron Blethen: [name] Treating Eldrige Pitkin/Extender: [name], [name]: 3 Vital Signs Height(in): 74 Pulse(bpm): 60 Weight(lbs): 221 Blood Pressure(mmHg): 178/68 Body Mass Index(BMI): 28 Temperature(F): 98.2 Respiratory Rate(breaths/min): 16 Photos: [N/A:N/A] Wound Location: Right Toe Great Right, Plantar Foot N/A Wounding Event:  Gradually Appeared Blister N/A Primary Etiology: Diabetic Wound/Ulcer of the Lower Diabetic Wound/Ulcer of the Lower N/A Extremity Extremity Comorbid History: Sleep Apnea, Congestive Heart Sleep Apnea, Congestive Heart N/A Failure, Coronary Artery Disease, Failure, Coronary Artery Disease, Hypertension, Peripheral Venous Hypertension, Peripheral Venous Disease, Type II Diabetes, History of Disease, Type II Diabetes, History of Burn, Osteoarthritis, Neuropathy Burn, Osteoarthritis, Neuropathy Date Acquired: 07/07/2019 07/18/2019 N/A Weeks of Treatment: 3 3 N/A Wound Status: Open Healed - Epithelialized N/A Pending Amputation on Yes Yes N/A Presentation: Measurements L x W x D (cm) 0.2x0.2x0.1 0x0x0 N/A Area (cm) : 0.031 0 N/A Volume (cm) : 0.003 0 N/A %  Reduction in Area: 0.00% 100.00% N/A % Reduction in Volume: 66.70% 100.00% N/A Classification: Grade 1 Grade 1 N/A Exudate Amount: Medium None Present N/A Exudate Type: Serous N/A N/A Exudate Color: amber N/A N/A Wound Margin: Flat and Intact Flat and Intact N/A Granulation Amount: Large (67-100%) None Present (0%) N/A Granulation Quality: Pink N/A N/A Necrotic Amount: None Present (0%) None Present (0%) N/A Exposed Structures: Fat Layer (Subcutaneous Tissue) Fascia: No N/A Exposed: Yes Fat Layer (Subcutaneous Tissue) Fascia: No Exposed: No Tendon: No Tendon: No Muscle: No Muscle: No Joint: No Joint: No Bone: No Bone: No Limited to Skin Breakdown Epithelialization: None Large (67-100%) N/A Treatment Notes [name] [name], [name] [name] ([id number]) Electronic Signature(s) Signed: 08/13/2019 5:21:44 PM By: Gretta Cool, BSN, RN, CWS, Kim RN, BSN Entered By: Gretta Cool, BSN, RN, CWS, Kim on 08/12/2019 09:34:04 [name] [name] ([id number]) -------------------------------------------------------------------------------- Multi-Disciplinary Care Plan Details Patient Name: [name] [name] Date of Service: 08/12/2019 9:30 AM Medical Record Number:  [id number] Patient Account Number: [id number] Date of Birth/Sex: 03-13-1961 (58 y.o. M) Treating RN: Cornell Barman Primary Care Ulrich Soules: [name] Other Clinician: Referring Isidor Bromell: [name] Treating Joycelynn Fritsche/Extender: [name], [name]: 3 Active Inactive Orientation to the Wound Care Program Nursing Diagnoses: Knowledge deficit related to the wound healing center program Goals: Patient/caregiver will verbalize understanding of the [facility] Program Date Initiated: 07/22/2019 Target Resolution Date: 08/06/2019 Goal Status: Active Interventions: Provide education on orientation to the wound center Notes: Wound/Skin Impairment Nursing Diagnoses: Impaired tissue integrity Goals: Ulcer/skin breakdown will have a volume reduction of 30% by week 4 Date Initiated: 07/22/2019 Target Resolution Date: 08/17/2019 Goal Status: Active Interventions: Assess ulceration(s) every visit Notes: Electronic Signature(s) Signed: 08/13/2019 5:21:44 PM By: Gretta Cool, BSN, RN, CWS, Kim RN, BSN Entered By: Gretta Cool, BSN, RN, CWS, Kim on 08/12/2019 09:33:55 [name] [name] ([id number]) -------------------------------------------------------------------------------- Pain Assessment Details Patient Name: [name] [name] Date of Service: 08/12/2019 9:30 AM Medical Record Number: [id number] Patient Account Number: [id number] Date of Birth/Sex: [birth year] (58 y.o. M) Treating RN: [name] Primary Care Tyah Acord: [name] Other Clinician: Referring Tejay Hubert: [name] Treating Zena Vitelli/Extender: [name], [name]: 3 Active Problems Location of Pain Severity and Description of Pain Patient Has Paino No Site Locations Pain Management and Medication Current Pain Management: Electronic Signature(s) Signed: 08/12/2019 4:33:07 PM By: [name] Entered By: [name] on 08/12/2019 09:25:32 [name] [name]  ([id number]) -------------------------------------------------------------------------------- Patient/Caregiver Education Details Patient Name: [name] [name] Date of Service: 08/12/2019 9:30 AM Medical Record Number: [id number] Patient Account Number: [id number] Date of Birth/Gender: November 27, 1960 (58 y.o. M) Treating RN: Cornell Barman Primary Care Physician: [name] Other Clinician: Referring Physician: [name] Treating Physician/Extender: Sharalyn Ink in Treatment: 3 Education Assessment Education Provided To: Patient Education Topics Provided Wound Debridement: Handouts: Wound Debridement Methods: Demonstration, Explain/Verbal Responses: State content correctly Wound/Skin Impairment: Handouts: Caring for Your Ulcer Methods: Demonstration, Explain/Verbal Responses: State content correctly Electronic Signature(s) Signed: 08/13/2019 5:21:44 PM By: Gretta Cool, BSN, RN, CWS, Kim RN, BSN Entered By: Gretta Cool, BSN, RN, CWS, Kim on 08/12/2019 09:38:54 [name] [name] ([id number]) -------------------------------------------------------------------------------- Wound Assessment Details Patient Name: [name] [name] Date of Service: 08/12/2019 9:30 AM Medical Record Number: [id number] Patient Account Number: [id number] Date of Birth/Sex: May 29, 1960 (58 y.o. M) Treating RN: Cornell Barman Primary Care Jeren Dufrane: [name] Other Clinician: Referring Ajahni Nay: [name] Treating Asjah Rauda/Extender: [name], [name]: 3 Wound Status Wound Number: 1 Primary Diabetic Wound/Ulcer of the Lower Extremity Etiology: Wound Location: Right Toe Great Wound Open Wounding Event: Gradually Appeared Status: Date  Acquired: 07/07/2019 Comorbid Sleep Apnea, Congestive Heart Failure, Coronary Artery Weeks Of Treatment: 3 History: Disease, Hypertension, Peripheral Venous Disease, Type II Clustered Wound: No Diabetes, History of Burn, Osteoarthritis, Neuropathy Pending Amputation  On Presentation Photos Wound Measurements Length: (cm) 0.2 % Reduct Width: (cm) 0.2 % Reduct Depth: (cm) 0.1 Epitheli Area: (cm) 0.031 Tunneli Volume: (cm) 0.003 Undermi ion in Area: 0% ion in Volume: 66.7% alization: None ng: No ning: No Wound Description Classification: Grade 1 Foul Odo Wound Margin: Flat and Intact Slough/F Exudate Amount: Medium Exudate Type: Serous Exudate Color: amber r After Cleansing: No ibrino Yes Wound Bed Granulation Amount: Large (67-100%) Exposed Structure Granulation Quality: Pink Fascia Exposed: No Necrotic Amount: None Present (0%) Fat Layer (Subcutaneous Tissue) Exposed: Yes Tendon Exposed: No Muscle Exposed: No Joint Exposed: No Bone Exposed: No Treatment Notes Wound #1 (Right Toe Great) Notes Promogram, coverlet to great toe Electronic Signature(s) [name] [name], [name] [name] ([id number]) Signed: 08/13/2019 5:21:44 PM By: Gretta Cool, BSN, RN, CWS, Kim RN, BSN Entered By: Gretta Cool, BSN, RN, CWS, Kim on 08/12/2019 09:33:39 [name] [name] ([id number]) -------------------------------------------------------------------------------- Wound Assessment Details Patient Name: [name] [name] Date of Service: 08/12/2019 9:30 AM Medical Record Number: [id number] Patient Account Number: [id number] Date of Birth/Sex: [birth year] (58 y.o. M) Treating RN: Cornell Barman Primary Care Adylee Leonardo: [name] Other Clinician: Referring Lamara Brecht: [name] Treating Cross Jorge/Extender: [name], [name]: 3 Wound Status Wound Number: 2 Primary Diabetic Wound/Ulcer of the Lower Extremity Etiology: Wound Location: Right, Plantar Foot Wound Healed - Epithelialized Wounding Event: Blister Status: Date Acquired: 07/18/2019 Comorbid Sleep Apnea, Congestive Heart Failure, Coronary Artery Weeks Of Treatment: 3 History: Disease, Hypertension, Peripheral Venous Disease, Type II Clustered Wound: No Diabetes, History of Burn, Osteoarthritis, Neuropathy Pending  Amputation On Presentation Photos Wound Measurements Length: (cm) 0 Width: (cm) 0 Depth: (cm) 0 Area: (cm) 0 Volume: (cm) 0 % Reduction in Area: 100% % Reduction in Volume: 100% Epithelialization: Large (67-100%) Tunneling: No Undermining: No Wound Description Classification: Grade 1 Wound Margin: Flat and Intact Exudate Amount: None Present Foul Odor After Cleansing: No Slough/Fibrino No Wound Bed Granulation Amount: None Present (0%) Exposed Structure Necrotic Amount: None Present (0%) Fascia Exposed: No Fat Layer (Subcutaneous Tissue) Exposed: No Tendon Exposed: No Muscle Exposed: No Joint Exposed: No Bone Exposed: No Limited to Skin Breakdown Electronic Signature(s) Signed: 08/13/2019 5:21:44 PM By: Gretta Cool, BSN, RN, CWS, Kim RN, BSN Entered By: Gretta Cool, BSN, RN, CWS, Kim on 08/12/2019 09:33:40 [name] [name] ([id number]) -------------------------------------------------------------------------------- Vitals Details Patient Name: [name] [name] Date of Service: 08/12/2019 9:30 AM Medical Record Number: [id number] Patient Account Number: [id number] Date of Birth/Sex: 1960/06/02 (58 y.o. M) Treating RN: Cornell Barman Primary Care Caydance Kuehnle: [name] Other Clinician: Referring Darya Bigler: [name] Treating Orean Giarratano/Extender: [name], [name]: 3 Vital Signs Time Taken: 09:20 Temperature (F): 98.2 Height (in): 74 Pulse (bpm): 60 Weight (lbs): 221 Respiratory Rate (breaths/min): 16 Body Mass Index (BMI): 28.4 Blood Pressure (mmHg): 178/68 Reference Range: 80 - 120 mg / dl Electronic Signature(s) Signed: 08/12/2019 3:52:07 PM By: Lorine Bears RCP, RRT, CHT Entered By: Becky Sax, Amado Nash on 08/12/2019 09:23:21

## 2019-08-17 ENCOUNTER — Other Ambulatory Visit
Admission: RE | Admit: 2019-08-17 | Discharge: 2019-08-17 | Disposition: A | Discharge status: Home / Self Care | Payer: 59 | Source: Ambulatory Visit | Attending: Cardiovascular Disease | Admitting: Cardiovascular Disease

## 2019-08-17 ENCOUNTER — Other Ambulatory Visit: Payer: Self-pay

## 2019-08-17 ENCOUNTER — Other Ambulatory Visit: Payer: Managed Care, Other (non HMO)

## 2019-08-17 ENCOUNTER — Ambulatory Visit (INDEPENDENT_AMBULATORY_CARE_PROVIDER_SITE_OTHER): Payer: 59

## 2019-08-17 DIAGNOSIS — I25118 Atherosclerotic heart disease of native coronary artery with other forms of angina pectoris: Secondary | ICD-10-CM | POA: Diagnosis not present

## 2019-08-17 DIAGNOSIS — R0600 Dyspnea, unspecified: Secondary | ICD-10-CM | POA: Insufficient documentation

## 2019-08-17 DIAGNOSIS — I255 Ischemic cardiomyopathy: Secondary | ICD-10-CM

## 2019-08-17 DIAGNOSIS — R600 Localized edema: Secondary | ICD-10-CM

## 2019-08-17 DIAGNOSIS — N1832 Chronic kidney disease, stage 3b: Secondary | ICD-10-CM | POA: Insufficient documentation

## 2019-08-17 DIAGNOSIS — Z794 Long term (current) use of insulin: Secondary | ICD-10-CM | POA: Diagnosis present

## 2019-08-17 DIAGNOSIS — E118 Type 2 diabetes mellitus with unspecified complications: Secondary | ICD-10-CM | POA: Diagnosis present

## 2019-08-17 DIAGNOSIS — N2889 Other specified disorders of kidney and ureter: Secondary | ICD-10-CM

## 2019-08-17 DIAGNOSIS — R0609 Other forms of dyspnea: Secondary | ICD-10-CM

## 2019-08-17 LAB — BASIC METABOLIC PANEL
Anion gap: 6 (ref 5–15)
BUN: 43 mg/dL — ABNORMAL HIGH (ref 6–20)
CO2: 22 mmol/L (ref 22–32)
Calcium: 8.3 mg/dL — ABNORMAL LOW (ref 8.9–10.3)
Chloride: 107 mmol/L (ref 98–111)
Creatinine, Ser: 2.13 mg/dL — ABNORMAL HIGH (ref 0.61–1.24)
GFR calc Af Amer: 38 mL/min — ABNORMAL LOW (ref >60)
GFR calc non Af Amer: 33 mL/min — ABNORMAL LOW (ref >60)
Glucose, Bld: 366 mg/dL — ABNORMAL HIGH (ref 70–99)
Potassium: 4.6 mmol/L (ref 3.5–5.1)
Sodium: 135 mmol/L (ref 135–145)

## 2019-08-19 ENCOUNTER — Encounter: Payer: 59 | Admitting: Physician Assistant

## 2019-08-19 ENCOUNTER — Other Ambulatory Visit: Payer: Self-pay

## 2019-08-19 DIAGNOSIS — E11621 Type 2 diabetes mellitus with foot ulcer: Secondary | ICD-10-CM | POA: Diagnosis not present
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
## 2019-08-19 NOTE — Progress Notes (Signed)
PRIDE, GONZALES ([id number]) Visit Report for 08/19/2019 Arrival Information Details Patient Name: [name] [name], [name] [name] Date of Service: 08/19/2019 9:30 AM Medical Record Number: [id number] Patient Account Number: [id number] Date of Birth/Sex: [birth year] (59 y.o. M) Treating RN: [name] Primary Care Alesandro Stueve: [name] Other Clinician: Referring Gavynn Duvall: [name] Treating Yechiel Erny/Extender: [name], [name]: 4 Visit Information History Since Last Visit Added or deleted any medications: No Patient Arrived: Cane Any new allergies or adverse reactions: No Arrival Time: 09:26 Had a fall or experienced change in No Accompanied By: self activities of daily living that may affect Transfer Assistance: None risk of falls: Patient Identification Verified: Yes Signs or symptoms of abuse/neglect since last visito No Secondary Verification Process Completed: Yes Hospitalized since last visit: No Implantable device outside of the clinic excluding No cellular tissue based products placed in the center since last visit: Has Dressing in Place as Prescribed: Yes Pain Present Now: No Electronic Signature(s) Signed: 08/19/2019 11:26:30 AM By: Lorine Bears RCP, RRT, CHT Entered By: Lorine Bears on 08/19/2019 09:26:46 [name] [name] ([id number]) -------------------------------------------------------------------------------- Encounter Discharge Information Details Patient Name: [name] [name] Date of Service: 08/19/2019 9:30 AM Medical Record Number: [id number] Patient Account Number: [id number] Date of Birth/Sex: 1960-11-12 (58 y.o. M) Treating RN: [name] Primary Care Chelbi Herber: [name] Other Clinician: Referring Zelpha Messing: [name] Treating Shakai Dolley/Extender: [name], [name]: 4 Encounter Discharge Information Items Post Procedure Vitals Discharge Condition: Stable Temperature (F): 98.5 Ambulatory Status:  Ambulatory Pulse (bpm): 67 Discharge Destination: Home Respiratory Rate (breaths/min): 16 Transportation: Private Auto Blood Pressure (mmHg): 170/67 Accompanied By: self Schedule Follow-up Appointment: Yes Clinical Summary of Care: Electronic Signature(s) Signed: 08/19/2019 3:40:54 PM By: [name] Entered By: [name] on 08/19/2019 09:57:01 [name] [name] ([id number]) -------------------------------------------------------------------------------- Lower Extremity Assessment Details Patient Name: [name] [name] Date of Service: 08/19/2019 9:30 AM Medical Record Number: [id number] Patient Account Number: [id number] Date of Birth/Sex: [birth year] (58 y.o. M) Treating RN: [name] Primary Care Mendy Lapinsky: [name] Other Clinician: Referring Zabdiel Dripps: [name] Treating Muneeb Veras/Extender: [name], [name]: 4 Edema Assessment Assessed: [Left: No] [Right: No] Edema: [Left: N] [Right: o] Vascular Assessment Pulses: Dorsalis Pedis Palpable: [Right:Yes] Electronic Signature(s) Signed: 08/19/2019 4:59:02 PM By: [name] Entered By: [name] on 08/19/2019 09:40:36 [name] [name] ([id number]) -------------------------------------------------------------------------------- Multi Wound Chart Details Patient Name: [name] [name] Date of Service: 08/19/2019 9:30 AM Medical Record Number: [id number] Patient Account Number: [id number] Date of Birth/Sex: Sep 17, 1960 (58 y.o. M) Treating RN: [name] Primary Care Isabela Nardelli: [name] Other Clinician: Referring Zayven Powe: [name] Treating Jenaveve Fenstermaker/Extender: [name], [name]: 4 Vital Signs Height(in): 74 Pulse(bpm): 20 Weight(lbs): 221 Blood Pressure(mmHg): 170/67 Body Mass Index(BMI): 28 Temperature(F): 98.5 Respiratory Rate(breaths/min): 18 Photos: [N/A:N/A] Wound Location: Right Toe Great N/A N/A Wounding Event: Gradually Appeared N/A N/A Primary Etiology:  Diabetic Wound/Ulcer of the Lower N/A N/A Extremity Comorbid History: Sleep Apnea, Congestive Heart N/A N/A Failure, Coronary Artery Disease, Hypertension, Peripheral Venous Disease, Type II Diabetes, History of Burn, Osteoarthritis, Neuropathy Date Acquired: 07/07/2019 N/A N/A Weeks of Treatment: 4 N/A N/A Wound Status: Open N/A N/A Pending Amputation on Yes N/A N/A Presentation: Measurements L x W x D (cm) 0.2x0.1x0.1 N/A N/A Area (cm) : 0.016 N/A N/A Volume (cm) : 0.002 N/A N/A % Reduction in Area: 48.40% N/A N/A % Reduction in Volume: 77.80% N/A N/A Classification: Grade 1 N/A N/A Exudate Amount: Medium N/A N/A Exudate Type: Serous N/A N/A Exudate Color: amber N/A N/A Wound Margin:  Flat and Intact N/A N/A Granulation Amount: Large (67-100%) N/A N/A Granulation Quality: Pink N/A N/A Necrotic Amount: None Present (0%) N/A N/A Exposed Structures: Fat Layer (Subcutaneous Tissue) N/A N/A Exposed: Yes Fascia: No Tendon: No Muscle: No Joint: No Bone: No Epithelialization: None N/A N/A Treatment Notes PAX, REASONER ([id number]) Electronic Signature(s) Signed: 08/19/2019 3:40:54 PM By: [name] Entered By: [name] on 08/19/2019 09:54:24 [name] [name] ([id number]) -------------------------------------------------------------------------------- [place] Details Patient Name: [name] [name] Date of Service: 08/19/2019 9:30 AM Medical Record Number: [id number] Patient Account Number: [id number] Date of Birth/Sex: [birth year] (58 y.o. M) Treating RN: [name] Primary Care Gavrielle Streck: [name] Other Clinician: Referring Cleaster Shiffer: [name] Treating Paulette Lynch/Extender: [name], [name]: 4 Active Inactive Orientation to the Wound Care Program Nursing Diagnoses: Knowledge deficit related to the wound healing center program Goals: Patient/caregiver will verbalize understanding of the [facility] Program Date  Initiated: 07/22/2019 Target Resolution Date: 08/06/2019 Goal Status: Active Interventions: Provide education on orientation to the wound center Notes: Wound/Skin Impairment Nursing Diagnoses: Impaired tissue integrity Goals: Ulcer/skin breakdown will have a volume reduction of 30% by week 4 Date Initiated: 07/22/2019 Target Resolution Date: 08/17/2019 Goal Status: Active Interventions: Assess ulceration(s) every visit Notes: Electronic Signature(s) Signed: 08/19/2019 3:40:54 PM By: [name] Entered By: [name] on 08/19/2019 09:54:17 [name] [name] ([id number]) -------------------------------------------------------------------------------- Pain Assessment Details Patient Name: [name] [name] Date of Service: 08/19/2019 9:30 AM Medical Record Number: [id number] Patient Account Number: [id number] Date of Birth/Sex: Apr 16, 1961 (59 y.o. M) Treating RN: [name] Primary Care Kenneth Lax: [name] Other Clinician: Referring Annastacia Duba: [name] Treating Lynea Rollison/Extender: [name], [name]: 4 Active Problems Location of Pain Severity and Description of Pain Patient Has Paino No Site Locations Pain Management and Medication Current Pain Management: Electronic Signature(s) Signed: 08/19/2019 4:59:02 PM By: [name] Entered By: [name] on 08/19/2019 09:36:48 [name] [name] ([id number]) -------------------------------------------------------------------------------- Patient/Caregiver Education Details Patient Name: [name] [name] Date of Service: 08/19/2019 9:30 AM Medical Record Number: [id number] Patient Account Number: [id number] Date of Birth/Gender: Apr 12, 1961 (58 y.o. M) Treating RN: [name] Primary Care Physician: [name] Other Clinician: Referring Physician: [name] Treating Physician/Extender: Sharalyn Ink in Treatment: 4 Education Assessment Education Provided To: Patient Education Topics  Provided Wound/Skin Impairment: Handouts: Caring for Your Ulcer Methods: Demonstration, Explain/Verbal Responses: State content correctly Electronic Signature(s) Signed: 08/19/2019 3:40:54 PM By: [name] Entered By: [name] on 08/19/2019 09:56:26 [name] [name] ([id number]) -------------------------------------------------------------------------------- Wound Assessment Details Patient Name: [name] [name] Date of Service: 08/19/2019 9:30 AM Medical Record Number: [id number] Patient Account Number: [id number] Date of Birth/Sex: 1960/05/30 (58 y.o. M) Treating RN: [name] Primary Care Sathvika Ojo: [name] Other Clinician: Referring Micha Erck: [name] Treating Joseguadalupe Stan/Extender: [name], [name]: 4 Wound Status Wound Number: 1 Primary Diabetic Wound/Ulcer of the Lower Extremity Etiology: Wound Location: Right Toe Great Wound Open Wounding Event: Gradually Appeared Status: Date Acquired: 07/07/2019 Comorbid Sleep Apnea, Congestive Heart Failure, Coronary Artery Weeks Of Treatment: 4 History: Disease, Hypertension, Peripheral Venous Disease, Type II Clustered Wound: No Diabetes, History of Burn, Osteoarthritis, Neuropathy Pending Amputation On Presentation Photos Wound Measurements Length: (cm) 0.2 % Reduct Width: (cm) 0.1 % Reduct Depth: (cm) 0.1 Epitheli Area: (cm) 0.016 Tunneli Volume: (cm) 0.002 Undermi ion in Area: 48.4% ion in Volume: 77.8% alization: None ng: No ning: No Wound Description Classification: Grade 1 Foul Odo Wound Margin: Flat and Intact Slough/F Exudate Amount: Medium Exudate Type: Serous Exudate Color: amber r After Cleansing:  No ibrino Yes Wound Bed Granulation Amount: Large (67-100%) Exposed Structure Granulation Quality: Pink Fascia Exposed: No Necrotic Amount: None Present (0%) Fat Layer (Subcutaneous Tissue) Exposed: Yes Tendon Exposed: No Muscle Exposed: No Joint Exposed: No Bone Exposed:  No Treatment Notes Wound #1 (Right Toe Great) Notes Promogram, coverlet to great toe Electronic Signature(s) KYRIE, FLUDD ([id number]) Signed: 08/19/2019 4:59:02 PM By: [name] Entered By: [name] on 08/19/2019 09:40:10 [name] [name] ([id number]) -------------------------------------------------------------------------------- Vitals Details Patient Name: [name] [name] Date of Service: 08/19/2019 9:30 AM Medical Record Number: [id number] Patient Account Number: [id number] Date of Birth/Sex: Dec 16, 1960 (58 y.o. M) Treating RN: [name] Primary Care Avonda Toso: [name] Other Clinician: Referring Millena Callins: [name] Treating Halah Whiteside/Extender: [name], [name]: 4 Vital Signs Time Taken: 09:25 Temperature (F): 98.5 Height (in): 74 Pulse (bpm): 63 Weight (lbs): 221 Respiratory Rate (breaths/min): 18 Body Mass Index (BMI): 28.4 Blood Pressure (mmHg): 170/67 Reference Range: 80 - 120 mg / dl Electronic Signature(s) Signed: 08/19/2019 11:26:30 AM By: Lorine Bears RCP, RRT, CHT Entered By: Lorine Bears on 08/19/2019 30:09:23

## 2019-08-23 ENCOUNTER — Telehealth: Payer: Self-pay | Admitting: *Deleted

## 2019-08-23 ENCOUNTER — Telehealth: Payer: Self-pay

## 2019-08-23 DIAGNOSIS — N1832 Chronic kidney disease, stage 3b: Secondary | ICD-10-CM

## 2019-08-23 NOTE — Telephone Encounter (Signed)
Results called to pt. Pt verbalized understanding of results and plan of care. He is concerned about his swelling. Right lower leg is more swollen that its ever been. He is wearing compression and elevating legs with no improvement. He does not feel the torsemide or furosemide have worked.  His second concern is the need for oxygen. Say he can't "get air into his lungs." Pulse ox while on the phone was 96-97%/ Advised he would need to reach out to PCP or pulmonologist for this.  In the meantime, routing to Dr Rockey Situ for further advice.

## 2019-08-23 NOTE — Telephone Encounter (Signed)
From history of diabetes he is having renal failure which is making it difficult to get the fluid off Ideally we need to have him see nephrology in case the kidneys get worse suddenly, we need to be prepared We can put in referral if needed He can increase the torsemide up to 40 twice daily BMP check in 2 -3 weeks time

## 2019-08-23 NOTE — Telephone Encounter (Signed)
Confirmed appointment on 08/24/2019 and screened for covid.. [facility] 

## 2019-08-23 NOTE — Telephone Encounter (Signed)
-----   Message from Minna Merritts, MD sent at 08/22/2019 10:51 AM EDT ----- Lab work reviewed  There is a slight improvement in creatinine after transition from Lasix to torsemide 20 twice daily.  Does a torsemide seem to be working for her? Electrolytes are stable Creatinine still well above her prior baseline Is she using compression hose for leg swelling? Recent ultrasound showing no DVT for unilateral swelling would try to use the compressions every day as we may be using excess diuretics for dependent edema, with subsequent dehydration/prerenal state  Also need a big push to get the sugars down, was running 366 She can discuss with Dr. Humphrey Rolls

## 2019-08-24 ENCOUNTER — Encounter: Payer: Self-pay | Admitting: Nurse Practitioner

## 2019-08-24 ENCOUNTER — Ambulatory Visit
Admission: RE | Admit: 2019-08-24 | Discharge: 2019-08-24 | Disposition: A | Discharge status: Home / Self Care | Payer: 59 | Attending: Nurse Practitioner | Admitting: Nurse Practitioner

## 2019-08-24 ENCOUNTER — Ambulatory Visit
Admission: RE | Admit: 2019-08-24 | Discharge: 2019-08-24 | Disposition: A | Discharge status: Home / Self Care | Payer: 59 | Source: Ambulatory Visit | Attending: Nurse Practitioner | Admitting: Nurse Practitioner

## 2019-08-24 ENCOUNTER — Other Ambulatory Visit: Payer: Self-pay

## 2019-08-24 ENCOUNTER — Ambulatory Visit: Payer: 59 | Admitting: Nurse Practitioner

## 2019-08-24 VITALS — BP 149/66 | HR 67 | Temp 97.3°F | Resp 16 | Ht 74.0 in | Wt 227.8 lb

## 2019-08-24 DIAGNOSIS — I10 Essential (primary) hypertension: Secondary | ICD-10-CM

## 2019-08-24 DIAGNOSIS — J449 Chronic obstructive pulmonary disease, unspecified: Secondary | ICD-10-CM

## 2019-08-24 DIAGNOSIS — R0602 Shortness of breath: Secondary | ICD-10-CM

## 2019-08-24 MED ORDER — BREO ELLIPTA 100-25 MCG/INH IN AEPB: 1.0000 | INHALATION_SPRAY | Freq: Every day | RESPIRATORY_TRACT | 3 refills | Status: DC

## 2019-08-24 MED ORDER — TORSEMIDE 20 MG PO TABS: 40.0000 mg | ORAL_TABLET | Freq: Two times a day (BID) | ORAL | 3 refills | Status: DC

## 2019-08-24 MED ORDER — ALBUTEROL SULFATE HFA 108 (90 BASE) MCG/ACT IN AERS: 2.0000 | INHALATION_SPRAY | Freq: Four times a day (QID) | RESPIRATORY_TRACT | 3 refills | Status: DC | PRN

## 2019-08-24 NOTE — Progress Notes (Signed)
Patient will be new patient to pulmonary on 08/31/2019. He sees Dr. Rockey Situ, cardiology. Started him on Breo daily and gave him rescue inhaler 08/24/2019.

## 2019-08-24 NOTE — Progress Notes (Signed)
Please let the patient know that chest x-ray shows nothing acute going on. There are some changes associated with chronic bronchitis. He should use inhalers as prescribed and will see pulmonary on 08/31/2019. Thanks.

## 2019-08-24 NOTE — Progress Notes (Signed)
[facility] [place], [place] [address]  Internal MEDICINE  Office Visit Note  Patient Name: [name] [name]  [id number]  [id number]  Date of Service: 09/05/2019   Pt is here for a sick visit.  Chief Complaint  Patient presents with  . Shortness of Breath    sitting and moving around, during the day comes and go and at nighttime it is bad, starts around 11/11:30, feels like fluids in lungs, past 4 weeks this has gotten worse  . Edema    both legs, left foot      The patient is here for sick visit. He states that he does have diagnosed history of COPD. He is not taking any medications or inhalers at this time. He states that he has never seen a pulmonologist for this. He states that his shortness of breath has persistently gotten worse. Getting short of breath with even small amounts of exertion. He does have history of coronary artery disease. He does have cardiologist who he sees routinely. He does have considerable chest tightness but does not have chest pain or pressure. Denies headaches        Current Medication:  Outpatient Encounter Medications as of 08/24/2019  Medication Sig  . carvedilol (COREG) 12.5 MG tablet Take 12.5 mg by mouth 2 (two) times daily with a meal.  . Evolocumab (REPATHA SURECLICK) 631 MG/ML SOAJ Inject 140 mg into the skin every 14 (fourteen) days.  Marland Kitchen ezetimibe (ZETIA) 10 MG tablet Take 10 mg by mouth daily.  . hydrALAZINE (APRESOLINE) 50 MG tablet Take 1 tablet (50 mg total) by mouth 3 (three) times daily.  . insulin degludec (TRESIBA FLEXTOUCH) 100 UNIT/ML SOPN FlexTouch Pen Inject 140 Units into the skin daily.  . insulin lispro (HUMALOG KWIKPEN) 100 UNIT/ML KwikPen Inject into the skin. 0-20 units per sliding scale  . pantoprazole (PROTONIX) 40 MG tablet Take 40 mg by mouth daily.  Marland Kitchen torsemide (DEMADEX) 20 MG tablet Take 2 tablets (40 mg total) by mouth 2 (two) times daily.  Marland Kitchen albuterol (VENTOLIN HFA) 108 (90 Base) MCG/ACT  inhaler Inhale 2 puffs into the lungs every 6 (six) hours as needed for wheezing or shortness of breath.  . fluticasone furoate-vilanterol (BREO ELLIPTA) 100-25 MCG/INH AEPB Inhale 1 puff into the lungs daily.   No facility-administered encounter medications on file as of 08/24/2019.      Medical History: Past Medical History:  Diagnosis Date  . Arrhythmia   . Arthritis   . CAD (coronary artery disease)   . Cardiomyopathy, ischemic   . CHF (congestive heart failure) (HCC)    NYHA II-III  . Colon polyps   . Diabetes ([place])   . Dupre's syndrome   . GERD (gastroesophageal reflux disease)   . Hyperlipidemia   . Hypertension   . Migraine   . Neuropathy   . Peripheral vascular disease ([place])   . Retinopathy   . Sleep apnea   . Stage 3 chronic kidney disease   . Stomach ulcer     Today's Vitals   08/24/19 1040  BP: (!) 149/66  Pulse: 67  Resp: 16  Temp: (!) 97.3 F (36.3 C)  SpO2: 95%  Weight: 227 lb 12.8 oz (103.3 kg)  Height: 6\' 2"  (1.88 m)   Body mass index is 29.25 kg/m.  Review of Systems  Constitutional: Positive for fatigue. Negative for chills and unexpected weight change.  HENT: Negative for congestion, postnasal drip, rhinorrhea, sneezing and sore throat.   Respiratory: Positive  for cough, chest tightness, shortness of breath and wheezing.        Gradually getting worse   Cardiovascular: Negative for chest pain and palpitations.  Gastrointestinal: Negative for abdominal pain, constipation, diarrhea, nausea and vomiting.  Musculoskeletal: Negative for arthralgias, back pain, joint swelling and neck pain.  Skin: Negative for rash.  Allergic/Immunologic: Positive for environmental allergies.  Neurological: Negative for dizziness, tremors, numbness and headaches.  Hematological: Negative for adenopathy. Does not bruise/bleed easily.  Psychiatric/Behavioral: Negative for behavioral problems, sleep disturbance and suicidal ideas. The patient is nervous/anxious.      Physical Exam Vitals and nursing note reviewed.  Constitutional:      General: He is in acute distress.     Appearance: Normal appearance. He is well-developed. He is not diaphoretic.  HENT:     Head: Normocephalic and atraumatic.     Nose: Nose normal.     Mouth/Throat:     Pharynx: No oropharyngeal exudate.  Eyes:     Pupils: Pupils are equal, round, and reactive to light.  Neck:     Thyroid: No thyromegaly.     Vascular: No JVD.     Trachea: No tracheal deviation.  Cardiovascular:     Rate and Rhythm: Normal rate and regular rhythm.     Heart sounds: Normal heart sounds. No murmur. No friction rub. No gallop.   Pulmonary:     Effort: Pulmonary effort is normal. No respiratory distress.     Breath sounds: Normal breath sounds. No wheezing or rales.     Comments: Patient has tachypnea and visible shortness of breath.  Chest:     Chest wall: No tenderness.  Abdominal:     Palpations: Abdomen is soft.  Musculoskeletal:        General: Normal range of motion.     Cervical back: Normal range of motion and neck supple.  Lymphadenopathy:     Cervical: No cervical adenopathy.  Skin:    General: Skin is warm and dry.  Neurological:     Mental Status: He is alert and oriented to person, place, and time.     Cranial Nerves: No cranial nerve deficit.  Psychiatric:        Behavior: Behavior normal.        Thought Content: Thought content normal.        Judgment: Judgment normal.    Assessment/Plan:  1. Chronic obstructive pulmonary disease, unspecified COPD type ([place]) Start Breo Ellipta - use one inhalation daily. Add rescue inhaler. Use two inhalations up to four to six times daily as needed for wheezing and shortness of breath. Refer to pulmonology for further evaluation.  - Ambulatory referral to Pulmonology - albuterol (VENTOLIN HFA) 108 (90 Base) MCG/ACT inhaler; Inhale 2 puffs into the lungs every 6 (six) hours as needed for wheezing or shortness of breath.  Dispense:  18 g; Refill: 3 - fluticasone furoate-vilanterol (BREO ELLIPTA) 100-25 MCG/INH AEPB; Inhale 1 puff into the lungs daily.  Dispense: 1 each; Refill: 3  2. Shortness of breath Start Breo Ellipta - use one inhalation daily. Add rescue inhaler. Use two inhalations up to four to six times daily as needed for wheezing and shortness of breath. Refer to pulmonology for further evaluation. Will get chest x ray for further evluation.  - DG Chest 2 View; Future - Ambulatory referral to Pulmonology - albuterol (VENTOLIN HFA) 108 (90 Base) MCG/ACT inhaler; Inhale 2 puffs into the lungs every 6 (six) hours as needed for wheezing or shortness of  breath.  Dispense: 18 g; Refill: 3 - fluticasone furoate-vilanterol (BREO ELLIPTA) 100-25 MCG/INH AEPB; Inhale 1 puff into the lungs daily.  Dispense: 1 each; Refill: 3  3. Essential hypertension Stable. Continue bp medication as prescribed   General Counseling: andruw battie understanding of the findings of todays visit and agrees with plan of treatment. I have discussed any further diagnostic evaluation that may be needed or ordered today. We also reviewed his medications today. he has been encouraged to call the office with any questions or concerns that should arise related to todays visit.    Counseling:  This patient was seen by Leretha Pol FNP Collaboration with Dr [name] as a part of collaborative care agreement  Orders Placed This Encounter  Procedures  . DG Chest 2 View  . Ambulatory referral to Pulmonology    Meds ordered this encounter  Medications  . albuterol (VENTOLIN HFA) 108 (90 Base) MCG/ACT inhaler    Sig: Inhale 2 puffs into the lungs every 6 (six) hours as needed for wheezing or shortness of breath.    Dispense:  18 g    Refill:  3    Order Specific Question:   Supervising Provider    Answer:   [name] [[id number]]  . fluticasone furoate-vilanterol (BREO ELLIPTA) 100-25 MCG/INH AEPB    Sig: Inhale 1 puff into the lungs  daily.    Dispense:  1 each    Refill:  3    Order Specific Question:   Supervising Provider    Answer:   [name] [[id number]]    Time spent: 30 Minutes

## 2019-08-24 NOTE — Telephone Encounter (Signed)
Reviewed provider recommendations to start torsemide 20 mg take 2 tablets (40 mg) twice a day and have labs checked in 2 weeks over at the [facility]. He wants to have that done prior to his appointment on 4/12 so that Dr. Rockey Situ will have that information and they can discuss. He was agreeable with referral over to nephrology and he requested that I check to make sure they are in network with his insurance [employer]. Updated prescription with pharmacy and called over to [facility] and they did confirm they are in network with [employer] and provided patient with their number. He verbalized understanding of our conversation, agreement with plan, and had no further questions at this time.

## 2019-08-25 ENCOUNTER — Telehealth: Payer: Self-pay

## 2019-08-25 NOTE — Telephone Encounter (Signed)
Pt notified for chest xray result

## 2019-08-25 NOTE — Telephone Encounter (Signed)
-----   Message from Ronnell Freshwater, NP sent at 08/24/2019  5:14 PM EDT ----- Please let the patient know that chest x-ray shows nothing acute going on. There are some changes associated with chronic bronchitis. He should use inhalers as prescribed and will see pulmonary on 08/31/2019. Thanks.

## 2019-08-26 ENCOUNTER — Encounter: Payer: 59 | Attending: Physician Assistant | Admitting: Physician Assistant

## 2019-08-26 ENCOUNTER — Telehealth: Payer: Self-pay

## 2019-08-26 ENCOUNTER — Ambulatory Visit (INDEPENDENT_AMBULATORY_CARE_PROVIDER_SITE_OTHER): Payer: 59

## 2019-08-26 ENCOUNTER — Other Ambulatory Visit: Payer: Self-pay

## 2019-08-26 DIAGNOSIS — I2510 Atherosclerotic heart disease of native coronary artery without angina pectoris: Secondary | ICD-10-CM | POA: Diagnosis not present

## 2019-08-26 DIAGNOSIS — E1140 Type 2 diabetes mellitus with diabetic neuropathy, unspecified: Secondary | ICD-10-CM | POA: Insufficient documentation

## 2019-08-26 DIAGNOSIS — L97512 Non-pressure chronic ulcer of other part of right foot with fat layer exposed: Secondary | ICD-10-CM | POA: Insufficient documentation

## 2019-08-26 DIAGNOSIS — I130 Hypertensive heart and chronic kidney disease with heart failure and stage 1 through stage 4 chronic kidney disease, or unspecified chronic kidney disease: Secondary | ICD-10-CM | POA: Diagnosis not present

## 2019-08-26 DIAGNOSIS — I5042 Chronic combined systolic (congestive) and diastolic (congestive) heart failure: Secondary | ICD-10-CM | POA: Insufficient documentation

## 2019-08-26 DIAGNOSIS — R600 Localized edema: Secondary | ICD-10-CM | POA: Diagnosis not present

## 2019-08-26 DIAGNOSIS — N183 Chronic kidney disease, stage 3 unspecified: Secondary | ICD-10-CM | POA: Diagnosis not present

## 2019-08-26 DIAGNOSIS — L84 Corns and callosities: Secondary | ICD-10-CM | POA: Diagnosis not present

## 2019-08-26 DIAGNOSIS — E1122 Type 2 diabetes mellitus with diabetic chronic kidney disease: Secondary | ICD-10-CM | POA: Diagnosis not present

## 2019-08-26 DIAGNOSIS — L97811 Non-pressure chronic ulcer of other part of right lower leg limited to breakdown of skin: Secondary | ICD-10-CM | POA: Diagnosis not present

## 2019-08-26 DIAGNOSIS — R0600 Dyspnea, unspecified: Secondary | ICD-10-CM

## 2019-08-26 DIAGNOSIS — E11621 Type 2 diabetes mellitus with foot ulcer: Secondary | ICD-10-CM | POA: Insufficient documentation

## 2019-08-26 DIAGNOSIS — R0609 Other forms of dyspnea: Secondary | ICD-10-CM

## 2019-08-26 NOTE — Telephone Encounter (Signed)
Confirmed appointment on 08/31/2019 and screened for covid. [facility] 
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
## 2019-08-31 ENCOUNTER — Other Ambulatory Visit: Payer: Self-pay

## 2019-08-31 ENCOUNTER — Ambulatory Visit: Payer: 59 | Admitting: Internal Medicine

## 2019-08-31 ENCOUNTER — Encounter: Payer: Self-pay | Admitting: Internal Medicine

## 2019-08-31 VITALS — BP 170/64 | HR 79 | Temp 97.8°F | Resp 16 | Ht 74.0 in | Wt 221.0 lb

## 2019-08-31 DIAGNOSIS — I10 Essential (primary) hypertension: Secondary | ICD-10-CM

## 2019-08-31 DIAGNOSIS — R0609 Other forms of dyspnea: Secondary | ICD-10-CM

## 2019-08-31 DIAGNOSIS — R0600 Dyspnea, unspecified: Secondary | ICD-10-CM

## 2019-08-31 DIAGNOSIS — J449 Chronic obstructive pulmonary disease, unspecified: Secondary | ICD-10-CM

## 2019-08-31 DIAGNOSIS — I509 Heart failure, unspecified: Secondary | ICD-10-CM

## 2019-08-31 DIAGNOSIS — G4733 Obstructive sleep apnea (adult) (pediatric): Secondary | ICD-10-CM | POA: Diagnosis not present

## 2019-08-31 NOTE — Progress Notes (Signed)
[facility] [place], [place] [address]  Pulmonary Sleep Medicine   Office Visit Note  Patient Name: [name] [name] DOB: [birth year] MRN [id number]  Date of Service: 08/31/2019  Complaints/HPI: Pt is here to establish care with pulmonary.  He reports being diagnosed with copd.  He was not on inhalers until recently he was started on Breo and albuterol for rescue.  He reports the breo has changed his life.  He is able to sleep laying nearly flat, which he has not been able to do in over 3 years.  He has not needed the albuterol inhaler more than once. He reports being diagnosed with OSA also.  He has a cpap machine but has not worn it.  He had difficulty tolerating the mask, and after trying multiple masks and eventually gave up.  He reports feeling like he was not getting enough air while wearing it.  Carbon monoxide poisoning at 59 yo,  ROS  General: (-) fever, (-) chills, (-) night sweats, (-) weakness Skin: (-) rashes, (-) itching,. Eyes: (-) visual changes, (-) redness, (-) itching. Nose and Sinuses: (-) nasal stuffiness or itchiness, (-) postnasal drip, (-) nosebleeds, (-) sinus trouble. Mouth and Throat: (-) sore throat, (-) hoarseness. Neck: (-) swollen glands, (-) enlarged thyroid, (-) neck pain. Respiratory: - cough, (-) bloody sputum, - shortness of breath, - wheezing. Cardiovascular: - ankle swelling, (-) chest pain. Lymphatic: (-) lymph node enlargement. Neurologic: (-) numbness, (-) tingling. Psychiatric: (-) anxiety, (-) depression   Current Medication: Outpatient Encounter Medications as of 08/31/2019  Medication Sig  . albuterol (VENTOLIN HFA) 108 (90 Base) MCG/ACT inhaler Inhale 2 puffs into the lungs every 6 (six) hours as needed for wheezing or shortness of breath.  . carvedilol (COREG) 12.5 MG tablet Take 12.5 mg by mouth 2 (two) times daily with a meal.  . Evolocumab (REPATHA SURECLICK) 371 MG/ML SOAJ Inject 140 mg into the skin every 14  (fourteen) days.  Marland Kitchen ezetimibe (ZETIA) 10 MG tablet Take 10 mg by mouth daily.  . fluticasone furoate-vilanterol (BREO ELLIPTA) 100-25 MCG/INH AEPB Inhale 1 puff into the lungs daily.  . hydrALAZINE (APRESOLINE) 50 MG tablet Take 1 tablet (50 mg total) by mouth 3 (three) times daily.  . insulin degludec (TRESIBA FLEXTOUCH) 100 UNIT/ML SOPN FlexTouch Pen Inject 140 Units into the skin daily.  . insulin lispro (HUMALOG KWIKPEN) 100 UNIT/ML KwikPen Inject into the skin. 0-20 units per sliding scale  . pantoprazole (PROTONIX) 40 MG tablet Take 40 mg by mouth daily.  Marland Kitchen torsemide (DEMADEX) 20 MG tablet Take 2 tablets (40 mg total) by mouth 2 (two) times daily.   No facility-administered encounter medications on file as of 08/31/2019.    Surgical History: Past Surgical History:  Procedure Laterality Date  . bone graft surgery    . CARDIAC CATHETERIZATION  2013   S/p PCI   . CARDIAC CATHETERIZATION  2018   S/p CABG  . CARDIAC SURGERY    . CORONARY ARTERY BYPASS GRAFT  2018   (LIMA-LAD,VG-RCA,VG-OM1,VG-D1)  . EYE SURGERY    . PERIPHERAL VASCULAR CATHETERIZATION Right 10/28/2016   PTA/DEB Right SFA  . WRIST SURGERY      Medical History: Past Medical History:  Diagnosis Date  . Arrhythmia   . Arthritis   . CAD (coronary artery disease)   . Cardiomyopathy, ischemic   . CHF (congestive heart failure) (HCC)    NYHA II-III  . Colon polyps   . Diabetes ([place])   . Dupre's  syndrome   . GERD (gastroesophageal reflux disease)   . Hyperlipidemia   . Hypertension   . Migraine   . Neuropathy   . Peripheral vascular disease ([place])   . Retinopathy   . Sleep apnea   . Stage 3 chronic kidney disease   . Stomach ulcer     Family History: Family History  Problem Relation Age of Onset  . Diabetes Mother   . Heart disease Father     Social History: Social History   Socioeconomic History  . Marital status: Married    Spouse name: Not on file  . Number of children: Not on file  . Years  of education: Not on file  . Highest education level: Not on file  Occupational History  . Not on file  Tobacco Use  . Smoking status: Former Smoker    Types: Cigarettes    Quit date: 05/18/2012    Years since quitting: 7.2  . Smokeless tobacco: Never Used  Substance and Sexual Activity  . Alcohol use: Not Currently    Comment: occasional   . Drug use: Never  . Sexual activity: Not on file  Other Topics Concern  . Not on file  Social History Narrative  . Not on file   Social Determinants of Health   Financial Resource Strain:   . Difficulty of Paying Living Expenses:   Food Insecurity:   . Worried About Charity fundraiser in the Last Year:   . Arboriculturist in the Last Year:   Transportation Needs:   . Film/video editor (Medical):   Marland Kitchen Lack of Transportation (Non-Medical):   Physical Activity:   . Days of Exercise per Week:   . Minutes of Exercise per Session:   Stress:   . Feeling of Stress :   Social Connections:   . Frequency of Communication with Friends and Family:   . Frequency of Social Gatherings with Friends and Family:   . Attends Religious Services:   . Active Member of Clubs or Organizations:   . Attends Archivist Meetings:   Marland Kitchen Marital Status:   Intimate Partner Violence:   . Fear of Current or Ex-Partner:   . Emotionally Abused:   Marland Kitchen Physically Abused:   . Sexually Abused:     Vital Signs: Blood pressure (!) 170/64, pulse 79, temperature 97.8 F (36.6 C), resp. rate 16, height 6\' 2"  (1.88 m), weight 221 lb (100.2 kg), SpO2 95 %.  Examination: General Appearance: The patient is well-developed, well-nourished, and in no distress. Skin: Gross inspection of skin unremarkable. Head: normocephalic, no gross deformities. Eyes: no gross deformities noted. ENT: ears appear grossly normal no exudates. Neck: Supple. No thyromegaly. No LAD. Respiratory: clear bilaterally. Cardiovascular: Normal S1 and S2 without murmur or  rub. Extremities: No cyanosis. pulses are equal. Neurologic: Alert and oriented. No involuntary movements.  LABS: Recent Results (from the past 2160 hour(s))  Basic metabolic panel     Status: Abnormal   Collection Time: 07/26/19  9:52 AM  Result Value Ref Range   Glucose 320 (H) 65 - 99 mg/dL   BUN 44 (H) 8 - 27 mg/dL   Creatinine, Ser 2.18 (H) 0.76 - 1.27 mg/dL   GFR calc non Af Amer 32 (L) >59 mL/min/1.73   GFR calc Af Amer 37 (L) >59 mL/min/1.73   BUN/Creatinine Ratio 20 10 - 24   Sodium 135 134 - 144 mmol/L   Potassium 5.4 (H) 3.5 - 5.2 mmol/L  Chloride 104 96 - 106 mmol/L   CO2 19 (L) 20 - 29 mmol/L   Calcium 8.7 8.6 - 10.2 mg/dL  Brain natriuretic peptide     Status: Abnormal   Collection Time: 07/26/19  9:52 AM  Result Value Ref Range   BNP 347.5 (H) 0.0 - 100.0 pg/mL  B Nat Peptide     Status: Abnormal   Collection Time: 07/30/19 10:53 AM  Result Value Ref Range   B Natriuretic Peptide 367.0 (H) 0.0 - 100.0 pg/mL    Comment: Performed at [facility], [address] [place], [place] [address]  Basic metabolic panel     Status: Abnormal   Collection Time: 07/30/19 10:53 AM  Result Value Ref Range   Sodium 134 (L) 135 - 145 mmol/L   Potassium 5.1 3.5 - 5.1 mmol/L   Chloride 107 98 - 111 mmol/L   CO2 20 (L) 22 - 32 mmol/L   Glucose, Bld 224 (H) 70 - 99 mg/dL    Comment: Glucose reference range applies only to samples taken after fasting for at least 8 hours.   BUN 42 (H) 6 - 20 mg/dL   Creatinine, Ser 2.41 (H) 0.61 - 1.24 mg/dL   Calcium 8.3 (L) 8.9 - 10.3 mg/dL   GFR calc non Af Amer 28 (L) >60 mL/min   GFR calc Af Amer 33 (L) >60 mL/min   Anion gap 7 5 - 15    Comment: Performed at [facility], [address] [place], [place] [address]  B Nat Peptide     Status: Abnormal   Collection Time: 08/02/19 11:49 AM  Result Value Ref Range   B Natriuretic Peptide 373.0 (H) 0.0 - 100.0 pg/mL    Comment: Performed at [facility], [address] [place], [place] [address]  Basic metabolic panel     Status: Abnormal   Collection Time: 08/02/19 11:49 AM  Result Value Ref Range   Sodium 139 135 - 145 mmol/L   Potassium 5.0 3.5 - 5.1 mmol/L   Chloride 108 98 - 111 mmol/L   CO2 21 (L) 22 - 32 mmol/L   Glucose, Bld 128 (H) 70 - 99 mg/dL    Comment: Glucose reference range applies only to samples taken after fasting for at least 8 hours.   BUN 48 (H) 6 - 20 mg/dL   Creatinine, Ser 2.42 (H) 0.61 - 1.24 mg/dL   Calcium 8.6 (L) 8.9 - 10.3 mg/dL   GFR calc non Af Amer 28 (L) >60 mL/min   GFR calc Af Amer 32 (L) >60 mL/min   Anion gap 10 5 - 15    Comment: Performed at [facility], [address] [place],  [address]  Basic metabolic panel     Status: Abnormal   Collection Time: 08/17/19 10:26 AM  Result Value Ref Range   Sodium 135 135 - 145 mmol/L   Potassium 4.6 3.5 - 5.1 mmol/L   Chloride 107 98 - 111 mmol/L   CO2 22 22 - 32 mmol/L   Glucose, Bld 366 (H) 70 - 99 mg/dL    Comment: Glucose reference range applies only to samples taken after fasting for at least 8 hours.   BUN 43 (H) 6 - 20 mg/dL   Creatinine, Ser 2.13 (H) 0.61 - 1.24 mg/dL   Calcium 8.3 (L) 8.9 - 10.3 mg/dL   GFR calc non Af Amer 33 (L) >60 mL/min   GFR calc Af Amer 38 (L) >60  mL/min   Anion gap 6 5 - 15    Comment: Performed at [facility], [address] [place], [place] [address]    Radiology: DG Chest 2 View  Result Date: 08/24/2019 CLINICAL DATA:  Shortness of breath EXAM: CHEST - 2 VIEW COMPARISON:  None. FINDINGS: Post CABG changes. The heart size and mediastinal contours are within normal limits. Mild atherosclerotic calcification of the aortic knob. Bilateral interstitial prominence without focal airspace consolidation. No pleural effusion or pneumothorax. The visualized skeletal structures are unremarkable. IMPRESSION: Bilateral interstitial prominence could reflect bronchitic type lung changes. No focal  airspace consolidation. Electronically Signed   By: Davina Poke D.O.   On: 08/24/2019 17:02    No results found.  DG Chest 2 View  Result Date: 08/24/2019 CLINICAL DATA:  Shortness of breath EXAM: CHEST - 2 VIEW COMPARISON:  None. FINDINGS: Post CABG changes. The heart size and mediastinal contours are within normal limits. Mild atherosclerotic calcification of the aortic knob. Bilateral interstitial prominence without focal airspace consolidation. No pleural effusion or pneumothorax. The visualized skeletal structures are unremarkable. IMPRESSION: Bilateral interstitial prominence could reflect bronchitic type lung changes. No focal airspace consolidation. Electronically Signed   By: Davina Poke D.O.   On: 08/24/2019 17:02   ECHOCARDIOGRAM COMPLETE  Result Date: 08/26/2019    ECHOCARDIOGRAM REPORT   Patient Name:   MATTHIAS BOGUS Date of Exam: 08/26/2019 Medical Rec #:  [id number]       Height:       74.0 in Accession #:    [id number]      Weight:       227.8 lb Date of Birth:  1961-05-12      BSA:          2.297 m Patient Age:    45 years        BP:           149/66 mmHg Patient Gender: M               HR:           63 bpm. Exam Location:  [place] Procedure: 2D Echo, Cardiac Doppler and Color Doppler Indications:    R06.02 SOB  History:        Patient has no prior history of Echocardiogram examinations.                 CAD, Prior CABG, COPD, Signs/Symptoms:Shortness of Breath and                 Dyspnea; Risk Factors:Hypertension, Diabetes, Former Smoker and                 Sleep Apnea. Patient has noticed in the last month increase SOB                 while erect. He also has noticed bilateral leg swelling. Patient                 has not been supine in over 2 years, he sleeps semi upright                 position.  Sonographer:    Salvadore Dom RVT, RDCS (AE), RDMS Referring Phys: [id number] [place] Comments: Patient is morbidly obese. Image acquisition challenging due to  COPD, Image acquisition challenging due to patient body habitus and patient unable to lay flat on left side. Test performed in semi upright position. IMPRESSIONS  1. Left ventricular ejection fraction,  by estimation, is 50 to 55%. The left ventricle has low normal function. The left ventricle has no regional wall motion abnormalities. Left ventricular diastolic parameters are consistent with Grade II diastolic dysfunction (pseudonormalization).  2. Right ventricular systolic function is normal. The right ventricular size is normal. There is normal pulmonary artery systolic pressure.  3. The mitral valve is degenerative. Trivial mitral valve regurgitation.  4. The aortic valve is tricuspid. Aortic valve regurgitation is not visualized. Mild aortic valve sclerosis is present, with no evidence of aortic valve stenosis.  5. The inferior vena cava is dilated in size with >50% respiratory variability, suggesting right atrial pressure of 8 mmHg. Comparison(s): Outside echocardiogram EF 40% on 03/12/2019. FINDINGS  Left Ventricle: Left ventricular ejection fraction, by estimation, is 50 to 55%. The left ventricle has low normal function. The left ventricle has no regional wall motion abnormalities. The left ventricular internal cavity size was normal in size. There is no left ventricular hypertrophy. Left ventricular diastolic parameters are consistent with Grade II diastolic dysfunction (pseudonormalization). Right Ventricle: The right ventricular size is normal. Right vetricular wall thickness was not assessed. Right ventricular systolic function is normal. There is normal pulmonary artery systolic pressure. The tricuspid regurgitant velocity is 1.54 m/s, and with an assumed right atrial pressure of 8 mmHg, the estimated right ventricular systolic pressure is 00.9 mmHg. Left Atrium: Left atrial size was normal in size. Right Atrium: Right atrial size was normal in size. Pericardium: There is no evidence of pericardial  effusion. Mitral Valve: The mitral valve is degenerative in appearance. Mild mitral annular calcification. Trivial mitral valve regurgitation. Tricuspid Valve: The tricuspid valve is grossly normal. Tricuspid valve regurgitation is trivial. Aortic Valve: The aortic valve is tricuspid. Aortic valve regurgitation is not visualized. Mild aortic valve sclerosis is present, with no evidence of aortic valve stenosis. Aortic valve mean gradient measures 6.0 mmHg. Aortic valve peak gradient measures 13.1 mmHg. Aortic valve area, by VTI measures 1.86 cm. Pulmonic Valve: The pulmonic valve was normal in structure. Pulmonic valve regurgitation is not visualized. Aorta: The aortic root and ascending aorta are structurally normal, with no evidence of dilitation. Venous: The inferior vena cava is dilated in size with greater than 50% respiratory variability, suggesting right atrial pressure of 8 mmHg. IAS/Shunts: No atrial level shunt detected by color flow Doppler.  LEFT VENTRICLE PLAX 2D LVIDd:         5.10 cm      Diastology LVIDs:         3.05 cm      LV e' lateral:   6.00 cm/s LV PW:         1.50 cm      LV E/e' lateral: 21.2 LV IVS:        0.90 cm      LV e' medial:    3.68 cm/s LVOT diam:     2.20 cm      LV E/e' medial:  34.5 LV SV:         71 LV SV Index:   31 LVOT Area:     3.80 cm  LV Volumes (MOD) LV vol d, MOD A2C: 115.0 ml LV vol d, MOD A4C: 118.0 ml LV vol s, MOD A2C: 82.3 ml LV vol s, MOD A4C: 73.2 ml LV SV MOD A2C:     32.7 ml LV SV MOD A4C:     118.0 ml LV SV MOD BP:      37.4 ml RIGHT VENTRICLE RV S prime:  10.10 cm/s TAPSE (M-mode): 1.7 cm LEFT ATRIUM             Index       RIGHT ATRIUM           Index LA diam:        4.20 cm 1.83 cm/m  RA Area:     16.00 cm LA Vol (A2C):   79.8 ml 34.74 ml/m RA Volume:   42.90 ml  18.68 ml/m LA Vol (A4C):   66.3 ml 28.86 ml/m LA Biplane Vol: 76.7 ml 33.39 ml/m  AORTIC VALVE                    PULMONIC VALVE AV Area (Vmax):    1.64 cm     PV Vmax:       0.69 m/s  AV Area (Vmean):   1.75 cm     PV Peak grad:  1.9 mmHg AV Area (VTI):     1.86 cm AV Vmax:           181.00 cm/s AV Vmean:          113.000 cm/s AV VTI:            0.381 m AV Peak Grad:      13.1 mmHg AV Mean Grad:      6.0 mmHg LVOT Vmax:         78.30 cm/s LVOT Vmean:        52.100 cm/s LVOT VTI:          0.186 m LVOT/AV VTI ratio: 0.49  AORTA Ao Root diam: 3.45 cm Ao Asc diam:  3.40 cm MITRAL VALVE                TRICUSPID VALVE MV Area (PHT): 3.20 cm     TR Peak grad:   9.5 mmHg MV Decel Time: 237 msec     TR Vmax:        154.00 cm/s MR Peak grad: 89.7 mmHg MR Vmax:      473.50 cm/s   SHUNTS MV E velocity: 127.00 cm/s  Systemic VTI:  0.19 m MV A velocity: 63.40 cm/s   Systemic Diam: 2.20 cm MV E/A ratio:  2.00 Kate Sable MD Electronically signed by Kate Sable MD Signature Date/Time: 08/26/2019/4:06:16 PM    Final    VAS [place] LOWER EXTREMITY VENOUS (DVT)  Result Date: 08/17/2019  Lower Venous DVTStudy Other Indications: Swelling left foot > right, swelling right leg > left.                    Symptoms present for several weeks without any change in                    severity. Blister bottom of right foot for several weeks.                    Increased dyspnea for past 2 months. Risk Factors: Patient is somewhat limited in his mobility. Comparison Study: None Performing Technologist: Pilar Jarvis RDMS, RVT, RDCS  Examination Guidelines: A complete evaluation includes B-mode imaging, spectral Doppler, color Doppler, and power Doppler as needed of all accessible portions of each vessel. Bilateral testing is considered an integral part of a complete examination. Limited examinations for reoccurring indications may be performed as noted. The reflux portion of the exam is performed with the patient in reverse Trendelenburg.  +---------+---------------+---------+-----------+----------+--------------+ RIGHT    CompressibilityPhasicitySpontaneityPropertiesThrombus Aging  +---------+---------------+---------+-----------+----------+--------------+ CFV  Full           Yes      Yes                                 +---------+---------------+---------+-----------+----------+--------------+ SFJ      Full           Yes      Yes                                 +---------+---------------+---------+-----------+----------+--------------+ FV Prox  Full           Yes      Yes                                 +---------+---------------+---------+-----------+----------+--------------+ FV Mid   Full           Yes      Yes                                 +---------+---------------+---------+-----------+----------+--------------+ FV DistalFull           Yes      Yes                                 +---------+---------------+---------+-----------+----------+--------------+ PFV      Full           Yes      Yes                                 +---------+---------------+---------+-----------+----------+--------------+ POP      Full           Yes      Yes                                 +---------+---------------+---------+-----------+----------+--------------+ PTV      Full           Yes      Yes                                 +---------+---------------+---------+-----------+----------+--------------+ PERO     Full           Yes      Yes                                 +---------+---------------+---------+-----------+----------+--------------+ Soleal   Full                                                        +---------+---------------+---------+-----------+----------+--------------+ Gastroc  Full                                                        +---------+---------------+---------+-----------+----------+--------------+  GSV      Full           Yes      Yes                                 +---------+---------------+---------+-----------+----------+--------------+ SSV      Full                                                         +---------+---------------+---------+-----------+----------+--------------+   +---------+---------------+---------+-----------+----------+--------------+ LEFT     CompressibilityPhasicitySpontaneityPropertiesThrombus Aging +---------+---------------+---------+-----------+----------+--------------+ CFV      Full           Yes      Yes                                 +---------+---------------+---------+-----------+----------+--------------+ SFJ      Full           Yes      Yes                                 +---------+---------------+---------+-----------+----------+--------------+ FV Prox  Full           Yes      Yes                                 +---------+---------------+---------+-----------+----------+--------------+ FV Mid   Full           Yes      Yes                                 +---------+---------------+---------+-----------+----------+--------------+ FV DistalFull           Yes      Yes                                 +---------+---------------+---------+-----------+----------+--------------+ PFV      Full           Yes      Yes                                 +---------+---------------+---------+-----------+----------+--------------+ POP      Full           Yes      Yes                                 +---------+---------------+---------+-----------+----------+--------------+ PTV      Full           Yes      Yes                                 +---------+---------------+---------+-----------+----------+--------------+ PERO     Full           Yes  Yes                                 +---------+---------------+---------+-----------+----------+--------------+ Soleal   Full                                                        +---------+---------------+---------+-----------+----------+--------------+ Gastroc  Full                                                         +---------+---------------+---------+-----------+----------+--------------+ GSV      Full           Yes      Yes                                 +---------+---------------+---------+-----------+----------+--------------+ SSV      Full                                                        +---------+---------------+---------+-----------+----------+--------------+     Summary: RIGHT: - No evidence of deep vein thrombosis in the lower extremity. No indirect evidence of obstruction proximal to the inguinal ligament.  LEFT: - No evidence of deep vein thrombosis in the lower extremity. No indirect evidence of obstruction proximal to the inguinal ligament.  *See table(s) above for measurements and observations. Electronically signed by Larae Grooms MD on 08/17/2019 at 12:50:50 PM.    Final       Assessment and Plan: There are no problems to display for this patient. 1. Chronic obstructive pulmonary disease, unspecified COPD type ([place]) PFT for baseline. Continue current inhaler regimen.  - Pulmonary Function Test; Future  2. OSA (obstructive sleep apnea) Non compliant with cpap.  He can not tolerate his mask.   3. DOE (dyspnea on exertion) Manageable at this time.  Echo 5 days ago was reviewed.  Normal EF.   4. Essential hypertension Elevated today 170/64.  Encouraged patient to be compliant with medications.   5. Other congestive heart failure ([place]) History off, per patient.  Echo 5 days ago was normal.     General Counseling: I have discussed the findings of the evaluation and examination with Nehemiah Massed.  I have also discussed any further diagnostic evaluation thatmay be needed or ordered today. Eunice verbalizes understanding of the findings of todays visit. We also reviewed his medications today and discussed drug interactions and side effects including but not limited excessive drowsiness and altered mental states. We also discussed that there is always a risk not just to  him but also people around him. he has been encouraged to call the office with any questions or concerns that should arise related to todays visit.  No orders of the defined types were placed in this encounter.    Time spent: 30 This patient was seen by Orson Gear AGNP-C in Collaboration with Dr. Devona Konig as a part of  collaborative care agreement.   I have personally obtained a history, examined the patient, evaluated laboratory and imaging results, formulated the assessment and plan and placed orders.    Allyne Gee, MD [facility] Pulmonary and Critical Care Sleep medicine

## 2019-09-02 ENCOUNTER — Other Ambulatory Visit: Payer: Self-pay

## 2019-09-02 ENCOUNTER — Telehealth: Payer: Self-pay | Admitting: *Deleted

## 2019-09-02 ENCOUNTER — Encounter: Payer: 59 | Admitting: Physician Assistant

## 2019-09-02 DIAGNOSIS — E11621 Type 2 diabetes mellitus with foot ulcer: Secondary | ICD-10-CM | POA: Diagnosis not present

## 2019-09-02 NOTE — Telephone Encounter (Signed)
Needs to get plugged in with nephrology Needs to make sure he does not have nephrotic syndrome/losing protein Renal dysfunction may be the issue

## 2019-09-02 NOTE — Telephone Encounter (Signed)
Valora Corporal, RN  08/31/2019 5:48 PM EDT    Reviewed results and recommendations with patient and he reports that he has been wearing them and has had legs elevated but no difference in his swelling. Do you have any other recommendations?

## 2019-09-02 NOTE — Progress Notes (Addendum)
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 to dressing wounds Wound #4 Right,Proximal,Lateral Lower Leg: Clean wound with Normal Saline. - In office Dial antibacterial soap, wash wounds, rinse and pat dry prior to dressing wounds Primary Wound Dressing: Wound #1 Right Toe Great: Collagen Wound #3 Right,Distal,Lateral Lower Leg: Collagen Wound #4 Right,Proximal,Lateral Lower Leg: Silver Alginate Secondary Dressing: Wound #1 Right Toe Great: [place] - coverlet on toe Wound #3 Right,Distal,Lateral Lower Leg: [place] on toe Wound #4 Right,Proximal,Lateral Lower Leg: [place] on toe Dressing Change Frequency: Wound #1 Right Toe Great: Change dressing every other day. Wound #3 Right,Distal,Lateral Lower Leg: Change dressing every other day. Wound #4 Right,Proximal,Lateral Lower Leg: Change dressing every other day. Follow-up Appointments: Wound #1 Right Toe Great: Return Appointment in 1 week. Wound #3 Right,Distal,Lateral Lower Leg: Return Appointment in 1 week. Wound #4 Right,Proximal,Lateral Lower Leg: Return Appointment in 1  week. Off-Loading: Wound #1 Right Toe Great: Open toe surgical shoe with peg assist. Wound #3 Right,Distal,Lateral Lower Leg: Open toe surgical shoe with peg assist. Wound #4 Right,Proximal,Lateral Lower Leg: Open toe surgical shoe with peg assist. Laboratory ordered were: Biopsy specimen culture - right leg 1. My suggestion at this time is can be that we use collagen to all wound locations. We are going to put a piece alginate over the biopsy site where he was bleeding more at this point in order to help with hemostasis that seems to be doing well. 2. We will continue with the PEG assist offloading shoe for the toe that seems to be doing better as well. 3. Depending on the results of the pathology report we will see where things stand. My hope in doing this is try to figure out exactly why he keeps developing blisters that seem to randomly occur such as on the bottom of his foot that have already healed and these new areas on his leg. We will see patient back for reevaluation in 1 week here in the clinic. If anything worsens or changes patient will contact our office for additional recommendations. [name] [name], [name] [name] ([id number]) Electronic Signature(s) Signed: 09/02/2019 6:09:30 PM By: Worthy Keeler PA-C Previous Signature: 09/02/2019 10:56:20 AM Version By: Worthy Keeler PA-C Previous Signature: 09/02/2019 10:55:22 AM Version By: Worthy Keeler PA-C Entered By: Worthy Keeler on 09/02/2019 18:09:30 [name] [name] ([id number]) -------------------------------------------------------------------------------- SuperBill Details Patient Name: [name] [name] Date of Service: 09/02/2019 Medical Record Number: [id number] Patient Account Number: [id number] Date of Birth/Sex: 06/25/1960 (58 y.o. 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 I10 Essential (primary) hypertension I25.10 Atherosclerotic heart disease of native coronary artery without angina pectoris  N18.30 Chronic kidney disease, stage 3 unspecified L84 Corns and callosities L97.811 Non-pressure chronic ulcer of other part of right lower leg limited to breakdown of skin Facility Procedures CPT4 Code: 75102585 Description: 99214 - WOUND CARE VISIT-LEV 4 EST PT Modifier: Quantity: 1 CPT4 Code: 27782423 Description: 53614 - DEBRIDE WOUND 1ST 20 SQ CM OR < Modifier: Quantity: 1 CPT4 Code: Description: ICD-10 Diagnosis Description L97.512 Non-pressure chronic ulcer of other part of right foot with fat layer exposed Modifier: Quantity: CPT4 Code: 43154008 Description: 11104-Punch biopsy of skin (including simple closure, when performed) single lesion Modifier: Quantity: 1 CPT4 Code: Description: ICD-10 Diagnosis Description L97.811 Non-pressure chronic ulcer of other part of right lower leg limited to breakdown Modifier: of skin Quantity: Physician Procedures CPT4 Code: 6761950 Description: 93267 - WC PHYS DEBR WO ANESTH 20 SQ CM Modifier: Quantity: 1 CPT4 Code: Description: ICD-10 Diagnosis Description L97.512 Non-pressure chronic ulcer of other part of right foot with fat layer exposed Modifier: Quantity: CPT4 Code: 11104 Description: Punch biopsy of skin (including simple closure, when performed) single lesion Modifier: Quantity: 1 CPT4 Code: Description: ICD-10 Diagnosis Description L97.811 Non-pressure chronic ulcer of other part of right lower leg limited to breakdown Modifier: of skin Quantity: Electronic Signature(s) Signed: 09/02/2019 6:09:55 PM By: Worthy Keeler PA-C Entered By:  Worthy Keeler on 09/02/2019 18:09:54

## 2019-09-02 NOTE — Telephone Encounter (Signed)
-----   Message from Minna Merritts, MD sent at 08/29/2019 10:07 PM EDT ----- Echo Normal EF, no valve issues No clear reason to suggest leg swelling Needs compression hose, leg elevation

## 2019-09-03 ENCOUNTER — Encounter (INDEPENDENT_AMBULATORY_CARE_PROVIDER_SITE_OTHER): Payer: Self-pay

## 2019-09-03 ENCOUNTER — Other Ambulatory Visit: Payer: Self-pay | Admitting: *Deleted

## 2019-09-03 ENCOUNTER — Other Ambulatory Visit
Admission: RE | Admit: 2019-09-03 | Discharge: 2019-09-03 | Disposition: A | Discharge status: Home / Self Care | Payer: 59 | Source: Ambulatory Visit | Attending: Cardiovascular Disease | Admitting: Cardiovascular Disease

## 2019-09-03 DIAGNOSIS — I25118 Atherosclerotic heart disease of native coronary artery with other forms of angina pectoris: Secondary | ICD-10-CM | POA: Insufficient documentation

## 2019-09-03 DIAGNOSIS — N1832 Chronic kidney disease, stage 3b: Secondary | ICD-10-CM

## 2019-09-03 LAB — BASIC METABOLIC PANEL
Anion gap: 9 (ref 5–15)
BUN: 50 mg/dL — ABNORMAL HIGH (ref 6–20)
CO2: 22 mmol/L (ref 22–32)
Calcium: 8.8 mg/dL — ABNORMAL LOW (ref 8.9–10.3)
Chloride: 101 mmol/L (ref 98–111)
Creatinine, Ser: 2.42 mg/dL — ABNORMAL HIGH (ref 0.61–1.24)
GFR calc Af Amer: 32 mL/min — ABNORMAL LOW (ref >60)
GFR calc non Af Amer: 28 mL/min — ABNORMAL LOW (ref >60)
Glucose, Bld: 355 mg/dL — ABNORMAL HIGH (ref 70–99)
Potassium: 4.2 mmol/L (ref 3.5–5.1)
Sodium: 132 mmol/L — ABNORMAL LOW (ref 135–145)

## 2019-09-03 NOTE — Telephone Encounter (Signed)
Patient has appointment on 09/06/19 with Dr Rockey Situ. This can be discussed at that time.

## 2019-09-03 NOTE — Progress Notes (Signed)
ESCHOL, AUXIER ([id number]) Visit Report for 09/02/2019 Arrival Information Details Patient Name: [name] [name], [name] [name] Date of Service: 09/02/2019 10:15 AM Medical Record Number: [id number] Patient Account Number: [id number] Date of Birth/Sex: 1960-09-07 (59 y.o. M) Treating RN: Cornell Barman Primary Care Ramond Darnell: [name] Other Clinician: Referring Torion Hulgan: [name] Treating Milea Klink/Extender: [name], [name]: 6 Visit Information History Since Last Visit Has Dressing in Place as Prescribed: Yes Patient Arrived: Cane Pain Present Now: No Arrival Time: 10:13 Accompanied By: self Transfer Assistance: None Patient Identification Verified: Yes Secondary Verification Process Completed: Yes Electronic Signature(s) Signed: 09/03/2019 10:29:18 AM By: Gretta Cool, BSN, RN, CWS, Kim RN, BSN Entered By: Gretta Cool, BSN, RN, CWS, Kim on 09/02/2019 10:13:37 [name] [name] ([id number]) -------------------------------------------------------------------------------- Clinic Level of Care Assessment Details Patient Name: [name] [name] Date of Service: 09/02/2019 10:15 AM Medical Record Number: [id number] Patient Account Number: [id number] Date of Birth/Sex: 1960-08-19 (58 y.o. M) Treating RN: [name] Primary Care Phyllis Abelson: [name] Other Clinician: Referring Ken Bonn: [name] Treating Nain Rudd/Extender: [name], [name]: 6 Clinic Level of Care Assessment Items TOOL 4 Quantity Score []  - Use when only an EandM is performed on FOLLOW-UP visit 0 ASSESSMENTS - Nursing Assessment / Reassessment X - Reassessment of Co-morbidities (includes updates in patient status) 1 10 X- 1 5 Reassessment of Adherence to Treatment Plan ASSESSMENTS - Wound and Skin Assessment / Reassessment []  - Simple Wound Assessment / Reassessment - one wound 0 X- 3 5 Complex Wound Assessment / Reassessment - multiple wounds []  - 0 Dermatologic / Skin Assessment (not related to wound  area) ASSESSMENTS - Focused Assessment []  - Circumferential Edema Measurements - multi extremities 0 []  - 0 Nutritional Assessment / Counseling / Intervention []  - 0 Lower Extremity Assessment (monofilament, tuning fork, pulses) []  - 0 Peripheral Arterial Disease Assessment (using hand held doppler) ASSESSMENTS - Ostomy and/or Continence Assessment and Care []  - Incontinence Assessment and Management 0 []  - 0 Ostomy Care Assessment and Management (repouching, etc.) PROCESS - Coordination of Care X - Simple Patient / Family Education for ongoing care 1 15 []  - 0 Complex (extensive) Patient / Family Education for ongoing care []  - 0 Staff obtains Programmer, systems, Records, Test Results / Process Orders []  - 0 Staff telephones HHA, Nursing Homes / Clarify orders / etc []  - 0 Routine Transfer to another Facility (non-emergent condition) []  - 0 Routine Hospital Admission (non-emergent condition) []  - 0 New Admissions / Biomedical engineer / Ordering NPWT, Apligraf, etc. []  - 0 Emergency Hospital Admission (emergent condition) X- 1 10 Simple Discharge Coordination []  - 0 Complex (extensive) Discharge Coordination PROCESS - Special Needs []  - Pediatric / Minor Patient Management 0 []  - 0 Isolation Patient Management []  - 0 Hearing / Language / Visual special needs []  - 0 Assessment of Community assistance (transportation, D/C planning, etc.) Wohlfarth, Shady ([id number]) []  - 0 Additional assistance / Altered mentation []  - 0 Support Surface(s) Assessment (bed, cushion, seat, etc.) INTERVENTIONS - Wound Cleansing / Measurement []  - Simple Wound Cleansing - one wound 0 X- 3 5 Complex Wound Cleansing - multiple wounds []  - 0 Wound Imaging (photographs - any number of wounds) []  - 0 Wound Tracing (instead of photographs) []  - 0 Simple Wound Measurement - one wound X- 3 5 Complex Wound Measurement - multiple wounds INTERVENTIONS - Wound Dressings []  - Small Wound Dressing  one or multiple wounds 0 X- 3 15 Medium Wound Dressing one or multiple wounds []  - 0 Large Wound Dressing one  or multiple wounds []  - 0 Application of Medications - topical []  - 0 Application of Medications - injection INTERVENTIONS - Miscellaneous []  - External ear exam 0 []  - 0 Specimen Collection (cultures, biopsies, blood, body fluids, etc.) []  - 0 Specimen(s) / Culture(s) sent or taken to Lab for analysis []  - 0 Patient Transfer (multiple staff / Civil Service fast streamer / Similar devices) []  - 0 Simple Staple / Suture removal (25 or less) []  - 0 Complex Staple / Suture removal (26 or more) []  - 0 Hypo / Hyperglycemic Management (close monitor of Blood Glucose) []  - 0 Ankle / Brachial Index (ABI) - do not check if billed separately X- 1 5 Vital Signs Has the patient been seen at the hospital within the last three years: Yes Total Score: 135 Level Of Care: New/Established - Level 4 Electronic Signature(s) Signed: 09/02/2019 1:30:06 PM By: [name] Entered By: [name] on 09/02/2019 10:52:38 [name] [name] ([id number]) -------------------------------------------------------------------------------- Encounter Discharge Information Details Patient Name: [name] [name] Date of Service: 09/02/2019 10:15 AM Medical Record Number: [id number] Patient Account Number: [id number] Date of Birth/Sex: 1961/02/22 (58 y.o. M) Treating RN: [name] Primary Care Graysyn Bache: [name] Other Clinician: Referring Lillyann Ahart: [name] Treating Leeana Creer/Extender: [name], [name]: 6 Encounter Discharge Information Items Post Procedure Vitals Discharge Condition: Stable Temperature (F): 98.7 Ambulatory Status: Ambulatory Pulse (bpm): 60 Discharge Destination: Home Respiratory Rate (breaths/min): 16 Transportation: Private Auto Blood Pressure (mmHg): 112/57 Accompanied By: self Schedule Follow-up Appointment: Yes Clinical Summary of Care: Electronic  Signature(s) Signed: 09/02/2019 1:30:06 PM By: [name] Entered By: [name] on 09/02/2019 10:54:41 [name] [name] ([id number]) -------------------------------------------------------------------------------- Lower Extremity Assessment Details Patient Name: [name] [name] Date of Service: 09/02/2019 10:15 AM Medical Record Number: [id number] Patient Account Number: [id number] Date of Birth/Sex: 07-05-1960 (58 y.o. M) Treating RN: Cornell Barman Primary Care Dariann Huckaba: [name] Other Clinician: Referring Amelita Risinger: [name] Treating Tiauna Whisnant/Extender: [name], [name]: 6 Vascular Assessment Pulses: Dorsalis Pedis Palpable: [Right:Yes] Electronic Signature(s) Signed: 09/03/2019 10:29:18 AM By: Gretta Cool, BSN, RN, CWS, Kim RN, BSN Entered By: Gretta Cool, BSN, RN, CWS, Kim on 09/02/2019 10:28:14 [name] [name] ([id number]) -------------------------------------------------------------------------------- Multi Wound Chart Details Patient Name: [name] [name] Date of Service: 09/02/2019 10:15 AM Medical Record Number: [id number] Patient Account Number: [id number] Date of Birth/Sex: [birth year] (58 y.o. 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 Comorbid History: Sleep Apnea, Congestive Heart Sleep Apnea, Congestive Heart Sleep Apnea, Congestive Heart Failure, Coronary Artery Disease, Failure,  Coronary Artery Disease, Failure, Coronary Artery Disease, Hypertension, Peripheral Venous Hypertension, Peripheral Venous Hypertension, Peripheral Venous Disease, Type II Diabetes, History of Disease, Type II Diabetes, History of Disease, Type II Diabetes, History of Burn, Osteoarthritis, Neuropathy Burn, Osteoarthritis, Neuropathy Burn, Osteoarthritis, Neuropathy Date Acquired: 07/07/2019 08/31/2019 09/01/2019 Weeks of Treatment: 6 0 0 Wound Status: Open Open Open Pending Amputation on Yes No No Presentation: Measurements L x W x D (cm) 0.2x0.2x0.4 1.5x2.8x0.1 1.1x0.8x0.1 Area (cm) : 0.031 3.299 0.691 Volume (cm) : 0.013 0.33 0.069 % Reduction in Area: 0.00% N/A 0.00% % Reduction in Volume: -44.40%  N/A 0.00% Starting Position 1 (o'clock): 1 Ending Position 1 (o'clock): 1 Maximum Distance 1 (cm): 0.2 Undermining: Yes No No Classification: Grade 1 Full Thickness Without Exposed Full Thickness Without Exposed Support Structures Support Structures Exudate Amount: Medium Medium None Present Exudate Type: Sanguinous Serous N/A Exudate Color: red amber N/A Wound Margin: Flat and Intact Flat and Intact Flat and Intact Granulation Amount: None Present (0%) Small (1-33%) None Present (0%) Granulation Quality: N/A Red N/A Necrotic Amount: None Present (0%) Large (67-100%) Large (67-100%) Necrotic Tissue: N/A Adherent Slough Eschar Exposed Structures: Fat Layer (Subcutaneous Tissue) Fat Layer (Subcutaneous Tissue) Fat Layer (Subcutaneous Tissue) Exposed: Yes Exposed: Yes Exposed: Yes Fascia: No Fascia: No Fascia: No Tendon: No Tendon: No Tendon: No Muscle: No Muscle: No Muscle: No Else, Gloria ([id number]) Joint: No Joint: No Joint: No Bone: No Bone: No Bone: No Epithelialization: None None Small (1-33%) Assessment Notes: Cannot visualize wound bed N/A N/A Treatment Notes Electronic Signature(s) Signed: 09/02/2019 1:30:06 PM By: [name] Entered By: [name] on  09/02/2019 10:36:08 [name] [name] ([id number]) -------------------------------------------------------------------------------- [place] Details Patient Name: [name] [name] Date of Service: 09/02/2019 10:15 AM Medical Record Number: [id number] Patient Account Number: [id number] Date of Birth/Sex: 12-Feb-1961 (58 y.o. M) Treating RN: Cornell Barman Primary Care Regnia Mathwig: [name] Other Clinician: Referring Shiasia Porro: [name] Treating Tawania Daponte/Extender: [name], [name]: 6 Active Inactive Orientation to the Wound Care Program Nursing Diagnoses: Knowledge deficit related to the wound healing center program Goals: Patient/caregiver will verbalize understanding of the [place] Program Date Initiated: 07/22/2019 Target Resolution Date: 08/06/2019 Goal Status: Active Interventions: Provide education on orientation to the wound center Notes: Wound/Skin Impairment Nursing Diagnoses: Impaired tissue integrity Goals: Ulcer/skin breakdown will have a volume reduction of 30% by week 4 Date Initiated: 07/22/2019 Target Resolution Date: 08/17/2019 Goal Status: Active Interventions: Assess ulceration(s) every visit Notes: Electronic Signature(s) Signed: 09/02/2019 1:30:06 PM By: [name] Signed: 09/03/2019 10:29:18 AM By: Gretta Cool, BSN, RN, CWS, Kim RN, BSN Entered By: [name] on 09/02/2019 10:35:56 [name] [name] ([id number]) -------------------------------------------------------------------------------- Pain Assessment Details Patient Name: [name] [name] Date of Service: 09/02/2019 10:15 AM Medical Record Number: [id number] Patient Account Number: [id number] Date of Birth/Sex: Oct 02, 1960 (59 y.o. M) Treating RN: Cornell Barman Primary Care Sireen Halk: [name] Other Clinician: Referring Maheen Cwikla: [name] Treating Gracious Renken/Extender: [name], [name]: 6 Active Problems Location of Pain Severity and  Description of Pain Patient Has Paino No Site Locations Pain Management and Medication Current Pain Management: Notes patient states nothing out of the ordinary Electronic Signature(s) Signed: 09/03/2019 10:29:18 AM By: Gretta Cool, BSN, RN, CWS, Kim RN, BSN Entered By: Gretta Cool, BSN, RN, CWS, Kim on 09/02/2019 10:15:02 [name] [name] ([id number]) -------------------------------------------------------------------------------- Patient/Caregiver Education Details Patient Name: [name] [name] Date of Service: 09/02/2019 10:15 AM Medical Record Number: [id number] Patient Account Number: [id number] Date of Birth/Gender: Apr 27, 1961 (58 y.o. M) Treating RN: [name] Primary Care Physician: [name] Other Clinician: Referring Physician: [name] Treating Physician/Extender: Sharalyn Ink in Treatment: 6 Education Assessment Education Provided To: Patient Education Topics Provided Wound/Skin Impairment: Handouts: Caring for Your Ulcer Methods: Demonstration, Explain/Verbal Responses: State content correctly Electronic Signature(s) Signed: 09/02/2019 1:30:06 PM By: [name] Entered By: [name] on 09/02/2019 10:53:00 [name] [name] ([id number]) -------------------------------------------------------------------------------- Wound Assessment Details Patient Name: [name] [name] Date of Service: 09/02/2019 10:15 AM Medical Record Number: [id number] Patient Account Number: [id number] Date of Birth/Sex: [birth year] (58 y.o. M) Treating RN: [name] Primary Care Briget Shaheed: [name] Other Clinician: Referring  Damarius Karnes: [name] Treating Ugo Thoma/Extender: [name], [name]: 6 Wound Status Wound Number: 1 Primary Diabetic Wound/Ulcer of the Lower Extremity Etiology: Wound Location: Right Toe Great Wound Open Wounding Event: Gradually Appeared Status: Date Acquired: 07/07/2019 Comorbid Sleep Apnea, Congestive Heart Failure, Coronary  Artery Weeks Of Treatment: 6 History: Disease, Hypertension, Peripheral Venous Disease, Type II Clustered Wound: No Diabetes, History of Burn, Osteoarthritis, Neuropathy Pending Amputation On Presentation Photos Wound Measurements Length: (cm) 0.1 % Reduct Width: (cm) 0.1 % Reduct Depth: (cm) 0.1 Epitheli Area: (cm) 0.008 Undermi Volume: (cm) 0.001 Star Endin Maxim ion in Area: 74.2% ion in Volume: 88.9% alization: None ning: Yes ting Position (o'clock): 1 g Position (o'clock): 1 um Distance: (cm) 0.2 Wound Description Classification: Grade 1 Foul Odo Wound Margin: Flat and Intact Slough/F Exudate Amount: Medium Exudate Type: Sanguinous Exudate Color: red r After Cleansing: No ibrino Yes Wound Bed Granulation Amount: None Present (0%) Exposed Structure Necrotic Amount: None Present (0%) Fascia Exposed: No Fat Layer (Subcutaneous Tissue) Exposed: Yes Tendon Exposed: No Muscle Exposed: No Joint Exposed: No Bone Exposed: No Assessment Notes Cannot visualize wound bed Treatment Notes SHAQUILE, LUTZE ([id number]) Wound #1 (Right Toe Great) Notes promagram [place], silver ag just for today on biopsy site Electronic Signature(s) Signed: 09/02/2019 1:30:06 PM By: [name] Signed: 09/03/2019 10:31:16 AM By: Worthy Keeler PA-C Entered By: Worthy Keeler on 09/02/2019 10:53:16 [name] [name] ([id number]) -------------------------------------------------------------------------------- Wound Assessment Details Patient Name: [name] [name] Date of Service: 09/02/2019 10:15 AM Medical Record Number: [id number] Patient Account Number: [id number] Date of Birth/Sex: May 31, 1960 (58 y.o. M) Treating RN: Cornell Barman Primary Care Timmothy Baranowski: [name] Other Clinician: Referring Aslan Himes: [name] Treating Alys Dulak/Extender: [name], [name]: 6 Wound Status Wound Number: 3 Primary Venous Leg Ulcer Etiology: Wound Location: Right, Distal, Lateral  Lower Leg Wound Open Wounding Event: Gradually Appeared Status: Date Acquired: 08/31/2019 Comorbid Sleep Apnea, Congestive Heart Failure, Coronary Artery Weeks Of Treatment: 0 History: Disease, Hypertension, Peripheral Venous Disease, Type II Clustered Wound: No Diabetes, History of Burn, Osteoarthritis, Neuropathy Photos Wound Measurements Length: (cm) 1.5 Width: (cm) 2.8 Depth: (cm) 0.1 Area: (cm) 3.299 Volume: (cm) 0.33 % Reduction in Area: % Reduction in Volume: Epithelialization: None Tunneling: No Undermining: No Wound Description Classification: Full Thickness Without Exposed Support Struct Wound Margin: Flat and Intact Exudate Amount: Medium Exudate Type: Serous Exudate Color: amber ures Foul Odor After Cleansing: No Slough/Fibrino Yes Wound Bed Granulation Amount: Small (1-33%) Exposed Structure Granulation Quality: Red Fascia Exposed: No Necrotic Amount: Large (67-100%) Fat Layer (Subcutaneous Tissue) Exposed: Yes Necrotic Quality: Adherent Slough Tendon Exposed: No Muscle Exposed: No Joint Exposed: No Bone Exposed: No Treatment Notes Wound #3 (Right, Distal, Lateral Lower Leg) Notes promagram [place], silver ag just for today on biopsy site Electronic Signature(s) AUGIE, VANE ([id number]) Signed: 09/03/2019 10:29:18 AM By: Gretta Cool, BSN, RN, CWS, Kim RN, BSN Entered By: Gretta Cool, BSN, RN, CWS, Kim on 09/02/2019 10:24:16 [name] [name] ([id number]) -------------------------------------------------------------------------------- Wound Assessment Details Patient Name: [name] [name] Date of Service: 09/02/2019 10:15 AM Medical Record Number: [id number] Patient Account Number: [id number] Date of Birth/Sex: Mar 03, 1961 (58 y.o. M) Treating RN: Cornell Barman Primary Care Mabell Esguerra: [name] Other Clinician: Referring Quantarius Genrich: [name] Treating Justis Closser/Extender: [name], [name]: 6 Wound Status Wound Number: 4 Primary Venous Leg  Ulcer Etiology: Wound Location: Right, Proximal, Lateral Lower Leg Wound Open Wounding Event: Gradually Appeared Status: Date Acquired: 09/01/2019 Comorbid Sleep Apnea, Congestive Heart Failure, Coronary Artery Weeks Of  Treatment: 0 History: Disease, Hypertension, Peripheral Venous Disease, Type II Clustered Wound: No Diabetes, History of Burn, Osteoarthritis, Neuropathy Photos Wound Measurements Length: (cm) 1.1 Width: (cm) 0.8 Depth: (cm) 0.1 Area: (cm) 0.691 Volume: (cm) 0.069 % Reduction in Area: 0% % Reduction in Volume: 0% Epithelialization: Small (1-33%) Tunneling: No Undermining: No Wound Description Classification: Full Thickness Without Exposed Support Structures Wound Margin: Flat and Intact Exudate Amount: None Present Foul Odor After Cleansing: No Slough/Fibrino No Wound Bed Granulation Amount: None Present (0%) Exposed Structure Necrotic Amount: Large (67-100%) Fascia Exposed: No Necrotic Quality: Eschar Fat Layer (Subcutaneous Tissue) Exposed: Yes Tendon Exposed: No Muscle Exposed: No Joint Exposed: No Bone Exposed: No Treatment Notes Wound #4 (Right, Proximal, Lateral Lower Leg) Notes promagram [place], silver ag just for today on biopsy site Electronic Signature(s) Signed: 09/03/2019 10:29:18 AM By: Gretta Cool, BSN, RN, CWS, Kim RN, BSN Entered By: Gretta Cool, BSN, RN, CWS, Kim on 09/02/2019 10:27:43 [name] [name] ([id number]) [name] [name] ([id number]) -------------------------------------------------------------------------------- Vitals Details Patient Name: [name] [name] Date of Service: 09/02/2019 10:15 AM Medical Record Number: [id number] Patient Account Number: [id number] Date of Birth/Sex: 1960-12-26 (58 y.o. M) Treating RN: Cornell Barman Primary Care Adine Heimann: [name] Other Clinician: Referring Ramaya Guile: [name] Treating Zhoey Blackstock/Extender: [name], [name]: 6 Vital Signs Time Taken: 10:13 Temperature (F):  98.7 Height (in): 74 Pulse (bpm): 60 Weight (lbs): 221 Respiratory Rate (breaths/min): 16 Body Mass Index (BMI): 28.4 Blood Pressure (mmHg): 112/57 Reference Range: 80 - 120 mg / dl Electronic Signature(s) Signed: 09/03/2019 10:29:18 AM By: Gretta Cool, BSN, RN, CWS, Kim RN, BSN Entered By: Gretta Cool, BSN, RN, CWS, Kim on 09/02/2019 10:14:09
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
## 2019-09-05 DIAGNOSIS — R0602 Shortness of breath: Secondary | ICD-10-CM | POA: Insufficient documentation

## 2019-09-05 DIAGNOSIS — I10 Essential (primary) hypertension: Secondary | ICD-10-CM | POA: Insufficient documentation

## 2019-09-05 DIAGNOSIS — J449 Chronic obstructive pulmonary disease, unspecified: Secondary | ICD-10-CM | POA: Insufficient documentation

## 2019-09-06 ENCOUNTER — Encounter: Payer: Self-pay | Admitting: Cardiovascular Disease

## 2019-09-06 ENCOUNTER — Ambulatory Visit (INDEPENDENT_AMBULATORY_CARE_PROVIDER_SITE_OTHER): Payer: 59 | Admitting: Cardiovascular Disease

## 2019-09-06 ENCOUNTER — Other Ambulatory Visit: Payer: Self-pay

## 2019-09-06 VITALS — BP 190/100 | HR 67 | Ht 74.0 in | Wt 219.0 lb

## 2019-09-06 DIAGNOSIS — E118 Type 2 diabetes mellitus with unspecified complications: Secondary | ICD-10-CM

## 2019-09-06 DIAGNOSIS — I255 Ischemic cardiomyopathy: Secondary | ICD-10-CM

## 2019-09-06 DIAGNOSIS — I890 Lymphedema, not elsewhere classified: Secondary | ICD-10-CM

## 2019-09-06 DIAGNOSIS — R0600 Dyspnea, unspecified: Secondary | ICD-10-CM

## 2019-09-06 DIAGNOSIS — N1832 Chronic kidney disease, stage 3b: Secondary | ICD-10-CM | POA: Diagnosis not present

## 2019-09-06 DIAGNOSIS — R0609 Other forms of dyspnea: Secondary | ICD-10-CM

## 2019-09-06 DIAGNOSIS — Z794 Long term (current) use of insulin: Secondary | ICD-10-CM

## 2019-09-06 DIAGNOSIS — I25118 Atherosclerotic heart disease of native coronary artery with other forms of angina pectoris: Secondary | ICD-10-CM

## 2019-09-06 LAB — SURGICAL PATHOLOGY

## 2019-09-06 MED ORDER — TORSEMIDE 20 MG PO TABS: 40.0000 mg | ORAL_TABLET | Freq: Two times a day (BID) | ORAL | 3 refills | Status: DC

## 2019-09-06 NOTE — Addendum Note (Signed)
Addended by: Valora Corporal on: 09/06/2019 03:32 PM   Modules accepted: Orders

## 2019-09-06 NOTE — Patient Instructions (Addendum)
Referral to [facility] for possible lymphedema  [facility] [place] [address] [phone]  Referral to nephrology [facility] [place] [address] [phone]  Call with blood pressures  Medication Instructions:  Please take torsemide 40 mg daily With torsemide 40 PRN in the afternoon  If you need a refill on your cardiac medications before your next appointment, please call your pharmacy.    Lab work: CMP 3 weeks in the office (draw) 10/01/2019   If you have labs (blood work) drawn today and your tests are completely normal, you will receive your results only by: Marland Kitchen MyChart Message (if you have MyChart) OR . A paper copy in the mail If you have any lab test that is abnormal or we need to change your treatment, we will call you to review the results.   Testing/Procedures: No new testing needed   Follow-Up: At [facility], you and your health needs are our priority.  As part of our continuing mission to provide you with exceptional heart care, we have created designated Provider Care Teams.  These Care Teams include your primary Cardiologist (physician) and Advanced Practice Providers (APPs -  Physician Assistants and Nurse Practitioners) who all work together to provide you with the care you need, when you need it.  . You will need a follow up appointment in 6 months   . Providers on your designated Care Team:   . Murray Hodgkins, NP . Christell Faith, PA-C . Marrianne Mood, PA-C  Any Other Special Instructions Will Be Listed Below (If Applicable).  For educational health videos Log in to : [email or web address] Or : [email or web address], password : [email or web address]

## 2019-09-06 NOTE — Telephone Encounter (Signed)
Patient seen today in office and plan of care reviewed at that time.

## 2019-09-09 ENCOUNTER — Ambulatory Visit: Payer: 59 | Admitting: Physician Assistant

## 2019-09-09 ENCOUNTER — Encounter: Payer: 59 | Admitting: Physician Assistant

## 2019-09-09 ENCOUNTER — Other Ambulatory Visit: Payer: Self-pay

## 2019-09-09 DIAGNOSIS — E11621 Type 2 diabetes mellitus with foot ulcer: Secondary | ICD-10-CM | POA: Diagnosis not present

## 2019-09-09 NOTE — Progress Notes (Signed)
BRAIN, HONEYCUTT ([id number]) Visit Report for 09/09/2019 Arrival Information Details Patient Name: [name] [name], [name] [name] Date of Service: 09/09/2019 10:45 AM Medical Record Number: [id number] Patient Account Number: [id number] Date of Birth/Sex: [birth year] (59 y.o. M) Treating RN: [name] Primary Care Lashun Ramseyer: [name] Other Clinician: Referring Eesha Schmaltz: [name] Treating Latessa Tillis/Extender: [name], [name]: 7 Visit Information History Since Last Visit Added or deleted any medications: No Patient Arrived: Cane Any new allergies or adverse reactions: No Arrival Time: 10:39 Had a fall or experienced change in No Accompanied By: self activities of daily living that may affect Transfer Assistance: None risk of falls: Patient Identification Verified: Yes Signs or symptoms of abuse/neglect since last visito No Secondary Verification Process Completed: Yes Hospitalized since last visit: No Implantable device outside of the clinic excluding No cellular tissue based products placed in the center since last visit: Has Dressing in Place as Prescribed: Yes Pain Present Now: No Electronic Signature(s) Signed: 09/09/2019 3:40:49 PM By: [name] Entered By: [name] on 09/09/2019 10:40:50 [name] [name] ([id number]) -------------------------------------------------------------------------------- Encounter Discharge Information Details Patient Name: [name] [name] Date of Service: 09/09/2019 10:45 AM Medical Record Number: [id number] Patient Account Number: [id number] Date of Birth/Sex: [birth year] (58 y.o. M) Treating RN: [name] Primary Care Ozie Dimaria: [name] Other Clinician: Referring Keavon Sensing: [name] Treating Deitrich Steve/Extender: [name], [name]: 7 Encounter Discharge Information Items Post Procedure Vitals Discharge Condition: Stable Temperature (F): 98.7 Ambulatory Status: Ambulatory Pulse (bpm): 62 Discharge  Destination: Home Respiratory Rate (breaths/min): 16 Transportation: Private Auto Blood Pressure (mmHg): 161/63 Accompanied By: self Schedule Follow-up Appointment: Yes Clinical Summary of Care: Electronic Signature(s) Signed: 09/09/2019 3:28:23 PM By: [name] Entered By: [name] on 09/09/2019 11:04:51 [name] [name] ([id number]) -------------------------------------------------------------------------------- Lower Extremity Assessment Details Patient Name: [name] [name] Date of Service: 09/09/2019 10:45 AM Medical Record Number: [id number] Patient Account Number: [id number] Date of Birth/Sex: 1961-03-25 (58 y.o. M) Treating RN: [name] Primary Care Jamilett Ferrante: [name] Other Clinician: Referring Layson Bertsch: [name] Treating Syndi Pua/Extender: [name], [name]: 7 Edema Assessment Assessed: [Left: No] [Right: No] Edema: [Left: Ye] [Right: s] Vascular Assessment Pulses: Dorsalis Pedis Palpable: [Right:Yes] Electronic Signature(s) Signed: 09/09/2019 3:40:49 PM By: [name] Entered By: [name] on 09/09/2019 10:50:32 [name] [name] ([id number]) -------------------------------------------------------------------------------- Multi Wound Chart Details Patient Name: [name] [name] Date of Service: 09/09/2019 10:45 AM Medical Record Number: [id number] Patient Account Number: [id number] Date of Birth/Sex: [birth year] (59 y.o. 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 Comorbid History: Sleep Apnea, Congestive Heart Sleep Apnea, Congestive Heart Sleep Apnea, Congestive Heart Failure, Coronary Artery Disease, Failure, Coronary Artery Disease, Failure, Coronary Artery Disease, Hypertension, Peripheral Venous Hypertension, Peripheral Venous Hypertension, Peripheral Venous Disease, Type II Diabetes, History of Disease, Type II Diabetes, History of Disease, Type II Diabetes, History of Burn, Osteoarthritis, Neuropathy Burn, Osteoarthritis, Neuropathy Burn, Osteoarthritis, Neuropathy Date Acquired: 07/07/2019 08/31/2019 09/01/2019 Weeks of Treatment: 7 1 1  Wound Status: Open Open Open Pending Amputation on Yes No No Presentation: Measurements L x W x D (cm) 0.2x0.2x0.2 1.2x2.4x0.1 0.8x0.7x0.1 Area (cm) :  0.031 2.262 0.44 Volume (cm) : 0.006 0.226 0.044 % Reduction in Area: 0.00% 31.40% 36.30% % Reduction in Volume: 33.30% 31.50% 36.20% Classification: Grade 1 Full Thickness Without Exposed Full Thickness Without Exposed Support Structures Support Structures Exudate Amount: Medium Medium None Present Exudate Type: Sanguinous Serous N/A Exudate Color: red amber N/A Wound Margin: Flat and Intact Flat and Intact Flat and Intact Granulation Amount: Medium (34-66%) Small (1-33%) Large (67-100%) Granulation Quality: Pink Red Pink Necrotic Amount: Medium (34-66%) Large (67-100%) Small (1-33%) Exposed Structures: Fat Layer (Subcutaneous Tissue) Fat Layer (Subcutaneous Tissue) Fat Layer (Subcutaneous Tissue) Exposed: Yes Exposed: Yes Exposed: Yes Fascia: No Fascia: No Fascia: No Tendon: No Tendon: No Tendon: No Muscle: No Muscle: No Muscle: No Joint: No Joint: No Joint: No Bone: No Bone: No Bone: No Epithelialization: None None Small (1-33%) Treatment Notes TOD, ABRAHAMSEN ([id number]) Electronic Signature(s) Signed: 09/09/2019  3:28:23 PM By: [name] Entered By: [name] on 09/09/2019 10:58:57 [name] [name] ([id number]) -------------------------------------------------------------------------------- Spencer Details Patient Name: [name] [name] Date of Service: 09/09/2019 10:45 AM Medical Record Number: [id number] Patient Account Number: [id number] Date of Birth/Sex: 1960-08-14 (58 y.o. M) Treating RN: [name] Primary Care Tiffanyann Deroo: [name] Other Clinician: Referring Jamiesha Victoria: [name] Treating Zahrah Sutherlin/Extender: [name], [name]: 7 Active Inactive Orientation to the Wound Care Program Nursing Diagnoses: Knowledge deficit related to the wound healing center program Goals: Patient/caregiver will verbalize understanding of the [facility] Date Initiated: 07/22/2019 Target Resolution Date: 08/06/2019 Goal Status: Active Interventions: Provide education on orientation to the wound center Notes: Wound/Skin Impairment Nursing Diagnoses: Impaired tissue integrity Goals: Ulcer/skin breakdown will have a volume reduction of 30% by week 4 Date Initiated: 07/22/2019 Target Resolution Date: 08/17/2019 Goal Status: Active Interventions: Assess ulceration(s) every visit Notes: Electronic Signature(s) Signed: 09/09/2019 3:28:23 PM By: [name] Entered By: [name] on 09/09/2019 10:58:49 [name] [name] ([id number]) -------------------------------------------------------------------------------- Pain Assessment Details Patient Name: [name] [name] Date of Service: 09/09/2019 10:45 AM Medical Record Number: [id number] Patient Account Number: [id number] Date of Birth/Sex: 06-01-1960 (59 y.o. M) Treating RN: [name] Primary Care Miette Molenda: [name] Other Clinician: Referring Van Ehlert: [name] Treating Quinlee Sciarra/Extender: [name], [name]: 7 Active Problems Location of Pain Severity and Description  of Pain Patient Has Paino No Site Locations Pain Management and Medication Current Pain Management: Electronic Signature(s) Signed: 09/09/2019 3:40:49 PM By: [name] Entered By: [name] on 09/09/2019 10:41:00 [name] [name] ([id number]) -------------------------------------------------------------------------------- Patient/Caregiver Education Details Patient Name: [name] [name] Date of Service: 09/09/2019 10:45 AM Medical Record Number: [id number] Patient Account Number: [id number] Date of Birth/Gender: April 02, 1961 (58 y.o. M) Treating RN: [name] Primary Care Physician: [name] Other Clinician: Referring Physician: [name] Treating Physician/Extender: Sharalyn Ink in Treatment: 7 Education Assessment Education Provided To: Patient Education Topics Provided Wound/Skin Impairment: Handouts: Caring for Your Ulcer Methods: Demonstration, Explain/Verbal Responses: State content correctly Electronic Signature(s) Signed: 09/09/2019 3:28:23 PM By: [name] Entered By: [name] on 09/09/2019 11:04:09 [name] [name] ([id number]) -------------------------------------------------------------------------------- Wound Assessment Details Patient Name: [name] [name] Date of Service: 09/09/2019 10:45 AM Medical Record Number: [id number] Patient Account Number: [id number] Date of Birth/Sex: 1960-08-19 (58 y.o. M) Treating RN: [name] Primary Care Rorie Delmore: [name] Other Clinician: Referring Dailynn Nancarrow: [name] Treating Laverda Stribling/Extender: [name], [name]: 7 Wound Status Wound Number: 1 Primary Diabetic Wound/Ulcer of the Lower Extremity Etiology: Wound Location: Right Toe Great Wound Open Wounding Event: Gradually Appeared Status: Date Acquired: 07/07/2019 Comorbid Sleep Apnea,  Congestive Heart Failure, Coronary Artery Weeks Of Treatment: 7 History: Disease, Hypertension, Peripheral Venous Disease, Type  II Clustered Wound: No Diabetes, History of Burn, Osteoarthritis, Neuropathy Pending Amputation On Presentation Photos Wound Measurements Length: (cm) 0.2 % Reduct Width: (cm) 0.2 % Reduct Depth: (cm) 0.2 Epitheli Area: (cm) 0.031 Tunneli Volume: (cm) 0.006 Undermi ion in Area: 0% ion in Volume: 33.3% alization: None ng: No ning: No Wound Description Classification: Grade 1 Foul Odo Wound Margin: Flat and Intact Slough/F Exudate Amount: Medium Exudate Type: Sanguinous Exudate Color: red r After Cleansing: No ibrino Yes Wound Bed Granulation Amount: Medium (34-66%) Exposed Structure Granulation Quality: Pink Fascia Exposed: No Necrotic Amount: Medium (34-66%) Fat Layer (Subcutaneous Tissue) Exposed: Yes Necrotic Quality: Adherent Slough Tendon Exposed: No Muscle Exposed: No Joint Exposed: No Bone Exposed: No Treatment Notes Wound #1 (Right Toe Great) Notes collagen, BFD Electronic Signature(s) CLEMMIE, MARXEN ([id number]) Signed: 09/09/2019 3:40:49 PM By: [name] Entered By: [name] on 09/09/2019 10:49:17 [name] [name] ([id number]) -------------------------------------------------------------------------------- Wound Assessment Details Patient Name: [name] [name] Date of Service: 09/09/2019 10:45 AM Medical Record Number: [id number] Patient Account Number: [id number] Date of Birth/Sex: Jan 24, 1961 (59 y.o. M) Treating RN: [name] Primary Care Cedric Mcclaine: [name] Other Clinician: Referring Brooke Steinhilber: [name] Treating Desa Rech/Extender: [name], [name]: 7 Wound Status Wound Number: 3 Primary Venous Leg Ulcer Etiology: Wound Location: Right, Distal, Lateral Lower Leg Wound Open Wounding Event: Gradually Appeared Status: Date Acquired: 08/31/2019 Comorbid Sleep Apnea, Congestive Heart Failure, Coronary Artery Weeks Of Treatment: 1 History: Disease, Hypertension, Peripheral Venous Disease, Type II Clustered  Wound: No Diabetes, History of Burn, Osteoarthritis, Neuropathy Photos Wound Measurements Length: (cm) 1.2 Width: (cm) 2.4 Depth: (cm) 0.1 Area: (cm) 2.262 Volume: (cm) 0.226 % Reduction in Area: 31.4% % Reduction in Volume: 31.5% Epithelialization: None Tunneling: No Undermining: No Wound Description Classification: Full Thickness Without Exposed Support Structu Wound Margin: Flat and Intact Exudate Amount: Medium Exudate Type: Serous Exudate Color: amber res Foul Odor After Cleansing: No Slough/Fibrino Yes Wound Bed Granulation Amount: Small (1-33%) Exposed Structure Granulation Quality: Red Fascia Exposed: No Necrotic Amount: Large (67-100%) Fat Layer (Subcutaneous Tissue) Exposed: Yes Necrotic Quality: Adherent Slough Tendon Exposed: No Muscle Exposed: No Joint Exposed: No Bone Exposed: No Treatment Notes Wound #3 (Right, Distal, Lateral Lower Leg) Notes collagen, BFD Electronic Signature(s) BEVAN, DISNEY ([id number]) Signed: 09/09/2019 3:40:49 PM By: [name] Entered By: [name] on 09/09/2019 10:49:53 [name] [name] ([id number]) -------------------------------------------------------------------------------- Wound Assessment Details Patient Name: [name] [name] Date of Service: 09/09/2019 10:45 AM Medical Record Number: [id number] Patient Account Number: [id number] Date of Birth/Sex: 07/31/1960 (59 y.o. M) Treating RN: [name] Primary Care Jerrick Farve: [name] Other Clinician: Referring Modesta Sammons: [name] Treating Martavius Lusty/Extender: [name], [name]: 7 Wound Status Wound Number: 4 Primary Venous Leg Ulcer Etiology: Wound Location: Right, Proximal, Lateral Lower Leg Wound Open Wounding Event: Gradually Appeared Status: Date Acquired: 09/01/2019 Comorbid Sleep Apnea, Congestive Heart Failure, Coronary Artery Weeks Of Treatment: 1 History: Disease, Hypertension, Peripheral Venous Disease, Type II Clustered  Wound: No Diabetes, History of Burn, Osteoarthritis, Neuropathy Photos Wound Measurements Length: (cm) 0.8 Width: (cm) 0.7 Depth: (cm) 0.1 Area: (cm) 0.44 Volume: (cm) 0.044 % Reduction in Area: 36.3% % Reduction in Volume: 36.2% Epithelialization: Small (1-33%) Tunneling: No Undermining: No Wound Description Classification: Full Thickness Without Exposed Support Structures Wound Margin: Flat and Intact Exudate Amount: None Present Foul Odor After Cleansing: No Slough/Fibrino No Wound Bed Granulation Amount: Large (67-100%) Exposed Structure Granulation Quality: Pink  Fascia Exposed: No Necrotic Amount: Small (1-33%) Fat Layer (Subcutaneous Tissue) Exposed: Yes Necrotic Quality: Adherent Slough Tendon Exposed: No Muscle Exposed: No Joint Exposed: No Bone Exposed: No Treatment Notes Wound #4 (Right, Proximal, Lateral Lower Leg) Notes collagen, BFD Electronic Signature(s) Signed: 09/09/2019 3:40:49 PM By: [name] Entered By: [name] on 09/09/2019 10:50:18 [name] [name] ([id number]) [name] [name] ([id number]) -------------------------------------------------------------------------------- Vitals Details Patient Name: [name] [name] Date of Service: 09/09/2019 10:45 AM Medical Record Number: [id number] Patient Account Number: [id number] Date of Birth/Sex: 13-Sep-1960 (58 y.o. M) Treating RN: [name] Primary Care Aaren Atallah: [name] Other Clinician: Referring Hula Tasso: [name] Treating Hyun Marsalis/Extender: [name], [name]: 7 Vital Signs Time Taken: 10:42 Temperature (F): 97.8 Height (in): 74 Pulse (bpm): 62 Weight (lbs): 221 Respiratory Rate (breaths/min): 16 Body Mass Index (BMI): 28.4 Blood Pressure (mmHg): 161/63 Reference Range: 80 - 120 mg / dl Electronic Signature(s) Signed: 09/09/2019 3:40:49 PM By: [name] Entered By: [name] on 09/09/2019 10:42:57

## 2019-09-09 NOTE — Progress Notes (Addendum)
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 There was a Excisional Skin/Subcutaneous Tissue Debridement with a total area of 0.04 sq cm performed by [name], Nicolai Labonte E., PA-C. With the following instrument(s): Curette to remove Viable and Non-Viable tissue/material. Material removed includes Mckinnon, Jrake ([id number]) Callus, Subcutaneous Tissue, and Slough after achieving pain control using Lidocaine. A time out was conducted at 10:58, prior to the start of the procedure. A Minimum amount of bleeding was controlled with Pressure. The procedure was tolerated well. Post Debridement Measurements:  0.2cm length x 0.2cm width x 0.2cm depth; 0.006cm^3 volume. Character of Wound/Ulcer Post Debridement is stable. Severity of Tissue Post Debridement is: Fat layer exposed. Post procedure Diagnosis Wound #1: Same as Pre-Procedure Wound #3 Pre-procedure diagnosis of Wound #3 is a Venous Leg Ulcer located on the Right,Distal,Lateral Lower Leg .Severity of Tissue Pre Debridement is: Fat layer exposed. There was a Excisional Skin/Subcutaneous Tissue Debridement with a total area of 2.88 sq cm performed by [name], Ausha Sieh E., PA-C. With the following instrument(s): Curette to remove Viable and Non-Viable tissue/material. Material removed includes Subcutaneous Tissue and Slough and after achieving pain control using Lidocaine. A time out was conducted at 10:58, prior to the start of the procedure. A Minimum amount of bleeding was controlled with Pressure. The procedure was tolerated well. Post Debridement Measurements: 1.2cm length x 2.4cm width x 0.1cm depth; 0.226cm^3 volume. Character of Wound/Ulcer Post Debridement is stable. Severity of Tissue Post Debridement is: Fat layer exposed. Post procedure Diagnosis Wound #3: Same as Pre-Procedure Plan Wound Cleansing: Wound #1 Right Toe Great: Clean wound with Normal Saline. - In office Dial antibacterial soap, wash wounds, rinse and pat dry prior to dressing wounds Wound #3 Right,Distal,Lateral Lower Leg: Clean wound with Normal Saline. - In office Dial antibacterial soap, wash wounds, rinse and pat dry prior to dressing wounds Wound #4 Right,Proximal,Lateral Lower Leg: Clean wound with Normal Saline. - In office Dial antibacterial soap, wash wounds, rinse and pat dry prior to dressing wounds Primary Wound Dressing: Wound #1 Right Toe Great: Collagen Wound #3 Right,Distal,Lateral Lower Leg: Collagen Wound #4 Right,Proximal,Lateral Lower Leg: Collagen Secondary Dressing: Wound #1 Right Toe Great: [place] on toe Wound #3  Right,Distal,Lateral Lower Leg: Boardered Foam Dressing Wound #4 Right,Proximal,Lateral Lower Leg: Boardered Foam Dressing Dressing Change Frequency: Wound #1 Right Toe Great: Change dressing every other day. Wound #3 Right,Distal,Lateral Lower Leg:  Change dressing every other day. Wound #4 Right,Proximal,Lateral Lower Leg: Change dressing every other day. Follow-up Appointments: Wound #1 Right Toe Great: Return Appointment in 1 week. Wound #3 Right,Distal,Lateral Lower Leg: Return Appointment in 1 week. Wound #4 Right,Proximal,Lateral Lower Leg: Return Appointment in 1 week. Off-Loading: Wound #1 Right Toe Great: Open toe surgical shoe with peg assist. Wound #3 Right,Distal,Lateral Lower Leg: Open toe surgical shoe with peg assist. Wound #4 Right,Proximal,Lateral Lower Leg: Open toe surgical shoe with peg assist. [place], Isidoro ([id number]) 1. My suggestion at this time is can be that we going continue with the wound care measures as before using the collagen at all wound locations and the patient is in agreement with plan. 2. I am also can recommend that we going continue with the postop shoe for offloading I think that is appropriate for the patient in regard to his right great toe. 3. Again with regard to dermatology referral the patient does not want to proceed as such with dermatology evaluation at this time. We will see patient back for reevaluation in 1 week here in the clinic. If anything worsens or changes patient will contact our office for additional recommendations. Electronic Signature(s) Signed: 09/09/2019 5:25:52 PM By: Worthy Keeler PA-C Entered By: Worthy Keeler on 09/09/2019 17:25:52 [name] ([id number]) -------------------------------------------------------------------------------- SuperBill Details Patient Name: [name] Date of Service: 09/09/2019 Medical Record Number: [id number] Patient Account Number: [id number] Date of Birth/Sex:  31-May-1960 (59 y.o. M) Treating RN: [name] Primary Care Provider: [name] Other Clinician: Referring Provider: [name] Treating Provider/Extender: [name], Katherin Ramey Weeks in Treatment: 7 Diagnosis Coding ICD-10 Codes Code Description E11.621 Type 2 diabetes mellitus with foot ulcer L97.512 Non-pressure chronic ulcer of other part of right foot with fat layer exposed I50.42 Chronic combined systolic (congestive) and diastolic (congestive) heart failure I10 Essential (primary) hypertension I25.10 Atherosclerotic heart disease of native coronary artery without angina pectoris N18.30 Chronic kidney disease, stage 3 unspecified L84 Corns and callosities L97.811 Non-pressure chronic ulcer of other part of right lower leg limited to breakdown of skin Facility Procedures CPT4 Code: 16384665 Description: 99357 - DEB SUBQ TISSUE 20 SQ CM/< Modifier: Quantity: 1 CPT4 Code: Description: ICD-10 Diagnosis Description L97.512 Non-pressure chronic ulcer of other part of right foot with fat layer expose L97.811 Non-pressure chronic ulcer of other part of right lower leg limited to break Modifier: d down of skin Quantity: Physician Procedures CPT4 Code: 0177939 Description: 99214 - WC PHYS LEVEL 4 - EST PT Modifier: 25 Quantity: 1 CPT4 Code: Description: ICD-10 Diagnosis Description E11.621 Type 2 diabetes mellitus with foot ulcer L97.512 Non-pressure chronic ulcer of other part of right foot with fat layer exposed I50.42 Chronic combined systolic (congestive) and diastolic (congestive)  heart failu I10 Essential (primary) hypertension Modifier: re Quantity: CPT4 Code: [id number] Description: 11042 - WC PHYS SUBQ TISS 20 SQ CM Modifier: Quantity: 1 CPT4 Code: Description: ICD-10 Diagnosis Description L97.512 Non-pressure chronic ulcer of other part of right foot with fat layer exposed L97.811 Non-pressure chronic ulcer of other part of right lower leg limited to breakd Modifier: own  of skin Quantity: Electronic Signature(s) Signed: 09/09/2019 5:26:13 PM By: Worthy Keeler PA-C Entered By: Worthy Keeler on 09/09/2019 17:26:13

## 2019-09-14 ENCOUNTER — Other Ambulatory Visit: Payer: Self-pay

## 2019-09-14 ENCOUNTER — Encounter (INDEPENDENT_AMBULATORY_CARE_PROVIDER_SITE_OTHER): Payer: Self-pay | Admitting: Vascular Surgery

## 2019-09-14 ENCOUNTER — Ambulatory Visit (INDEPENDENT_AMBULATORY_CARE_PROVIDER_SITE_OTHER): Payer: 59 | Admitting: Vascular Surgery

## 2019-09-14 VITALS — BP 139/74 | HR 62 | Resp 16 | Ht 74.0 in | Wt 215.8 lb

## 2019-09-14 DIAGNOSIS — I10 Essential (primary) hypertension: Secondary | ICD-10-CM

## 2019-09-14 DIAGNOSIS — Z794 Long term (current) use of insulin: Secondary | ICD-10-CM

## 2019-09-14 DIAGNOSIS — E1140 Type 2 diabetes mellitus with diabetic neuropathy, unspecified: Secondary | ICD-10-CM | POA: Diagnosis not present

## 2019-09-14 DIAGNOSIS — E119 Type 2 diabetes mellitus without complications: Secondary | ICD-10-CM | POA: Insufficient documentation

## 2019-09-14 DIAGNOSIS — I890 Lymphedema, not elsewhere classified: Secondary | ICD-10-CM

## 2019-09-14 DIAGNOSIS — M7989 Other specified soft tissue disorders: Secondary | ICD-10-CM | POA: Diagnosis not present

## 2019-09-14 DIAGNOSIS — N183 Chronic kidney disease, stage 3 unspecified: Secondary | ICD-10-CM

## 2019-09-14 NOTE — Assessment & Plan Note (Signed)
Patient presents today with what appears to be stage 2 lymphedema.  We have discussed the pathophysiology and natural history of lymphedema. I have recommended 20-30 mmHg compression stockings, and we have prescribed these today. I have recommended increasing activity and excersize to try to improve the swelling. I have discussed the importance of leg elevation and asked the patient elevate her legs as much as tolerated. I have also ordered a venous duplex to assess for whether or not significant venous reflux may be a contributing factor. The patient will have this done at their convenience. I will see them back following the noninvasive study to discuss the results as well as to assess their response to conservative therapies.

## 2019-09-14 NOTE — Assessment & Plan Note (Signed)
Chronic kidney disease can certainly be contributing to lower extremity swelling.  He is on diuretic therapy.

## 2019-09-14 NOTE — Progress Notes (Signed)
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 only to samples taken after fasting for at least 8 hours.   BUN 50 (H) 6 - 20 mg/dL   Creatinine, Ser 2.42 (H) 0.61 - 1.24 mg/dL   Calcium 8.8 (L) 8.9 - 10.3 mg/dL   GFR calc non Af Amer 28 (L) >60 mL/min   GFR calc Af Amer 32 (L) >60 mL/min   Anion gap 9 5 - 15    Comment: Performed at [facility], [address] [place], [place] [address]    Assessment/Plan:  Diabetes ([place]) blood glucose control important in reducing the progression of atherosclerotic disease. Also, involved in wound healing. On appropriate medications.   Essential hypertension blood pressure control important in reducing the progression of atherosclerotic disease. On appropriate oral medications.   CKD (chronic kidney disease) stage 3, GFR 30-59  ml/min Chronic kidney disease can certainly be contributing to lower extremity swelling.  He is on diuretic therapy.  Swelling of limb I have had a long discussion with the patient regarding swelling and why it  causes symptoms.  Patient will begin wearing graduated compression stockings class 1 (20-30 mmHg) on a daily basis a prescription was given. The patient will  beginning wearing the stockings first thing in the morning and removing them in the evening. The patient is instructed specifically not to sleep in the stockings.   In addition, behavioral modification will be initiated.  This will include frequent elevation, use of over the counter pain medications and exercise such as walking.  I have reviewed systemic causes for chronic edema such as liver, kidney and cardiac etiologies.  The patient denies problems with these organ systems.    Consideration for a lymph pump will also be made based upon the effectiveness of conservative therapy.  This would help to improve the edema control and prevent sequela such as ulcers and infections   Patient should undergo duplex ultrasound of the venous system to ensure that DVT or reflux is not present.  The patient will follow-up with me after the ultrasound.    Lymphedema Patient presents today with what appears to be stage 2 lymphedema.  We have discussed the pathophysiology and natural history of lymphedema. I have recommended 20-30 mmHg compression stockings, and we have prescribed these today. I have recommended increasing activity and excersize to try to improve the swelling. I have discussed the importance of leg elevation and asked the patient elevate her legs as much as tolerated. I have also ordered a venous duplex to assess for  whether or not significant venous reflux may be a contributing factor. The patient will have this done at their convenience. I will see them back following the noninvasive study to discuss the results as well as to  assess their response to conservative therapies.      Leotis Pain 09/14/2019, 11:37 AM   This note was created with Dragon medical transcription system.  Any errors from dictation are unintentional.

## 2019-09-14 NOTE — Assessment & Plan Note (Signed)

## 2019-09-14 NOTE — Assessment & Plan Note (Signed)
blood glucose control important in reducing the progression of atherosclerotic disease. Also, involved in wound healing. On appropriate medications.  

## 2019-09-14 NOTE — Assessment & Plan Note (Signed)
blood pressure control important in reducing the progression of atherosclerotic disease. On appropriate oral medications.  

## 2019-09-14 NOTE — Patient Instructions (Signed)

## 2019-09-16 ENCOUNTER — Encounter: Payer: 59 | Admitting: Internal Medicine

## 2019-09-16 ENCOUNTER — Other Ambulatory Visit: Payer: Self-pay

## 2019-09-16 DIAGNOSIS — E11621 Type 2 diabetes mellitus with foot ulcer: Secondary | ICD-10-CM | POA: Diagnosis not present

## 2019-09-16 NOTE — Progress Notes (Signed)
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 volume. Character of Wound/Ulcer Post Debridement is stable. Severity of Tissue Post Debridement is: Fat layer exposed. Post procedure Diagnosis Wound #3: Same as Pre-Procedure Michon, Hilary ([id number]) Wound #4 Pre-procedure diagnosis of Wound #4 is a Venous Leg Ulcer located on the Right,Proximal,Lateral Lower Leg .Severity of Tissue Pre Debridement is: Fat layer exposed. There was a Excisional Skin/Subcutaneous Tissue Debridement with a total area of 0.2 sq cm performed by Ricard Dillon, MD. With the following instrument(s): Curette to remove Viable and Non-Viable tissue/material. Material removed includes Subcutaneous Tissue after achieving pain control using Lidocaine. A time out was conducted at 14:20, prior to the start of the procedure. A Minimum amount of bleeding was controlled with Pressure. The procedure was  tolerated well. Post Debridement Measurements: 0.4cm length x 0.5cm width x 0.1cm depth; 0.016cm^3 volume. Character of Wound/Ulcer Post Debridement is stable. Severity of Tissue Post Debridement is: Fat layer exposed. Post procedure Diagnosis Wound #4: Same as Pre-Procedure Plan Wound Cleansing: Wound #1 Right Toe Great: Clean wound with Normal Saline. - In office Dial antibacterial soap, wash wounds, rinse and pat dry prior to dressing wounds Wound #3 Right,Distal,Lateral Lower Leg: Clean wound with Normal Saline. - In office Dial antibacterial soap, wash wounds, rinse and pat dry prior to dressing wounds Wound #4 Right,Proximal,Lateral Lower Leg: Clean wound with Normal Saline. - In office Dial antibacterial soap, wash wounds, rinse and pat dry prior to dressing wounds Primary Wound Dressing: Wound #1 Right Toe Great: Collagen - moisten with saline Wound #3 Right,Distal,Lateral Lower Leg: Collagen - moisten with saline Wound #4 Right,Proximal,Lateral Lower Leg: Collagen - moisten with saline Secondary Dressing: Wound #1 Right Toe Great: Conform/Kerl[place] - on toe Foam - on toe [place] - on leg wounds Wound #3 Right,Distal,Lateral Lower Leg: [place] on toe Wound #4 Right,Proximal,Lateral Lower Leg: [place] on toe Dressing Change Frequency: Wound #1 Right Toe Great: Change dressing every other day. Wound #3 Right,Distal,Lateral Lower Leg: Change dressing every other day. Wound #4 Right,Proximal,Lateral Lower Leg: Change dressing every other day. Follow-up Appointments: Wound #1 Right Toe Great: Return Appointment in 1 week. Wound #3 Right,Distal,Lateral Lower Leg: Return Appointment in 1 week. Wound #4 Right,Proximal,Lateral Lower Leg: Return Appointment in 1 week. Off-Loading: Wound #1 Right Toe Great: Open toe surgical shoe with peg assist. Wound #3 Right,Distal,Lateral Lower  Leg: Open toe surgical shoe with peg assist. Wound #4 Right,Proximal,Lateral Lower Leg: Open toe surgical shoe with peg assist. [name] [name], [name] [name] ([id number]) 1. Continue with collagen to all areas 2. The area on the plantar toe actually might be healed by next week 3. The wounds on his legs really did not have anything close to a viable surface I aggressively debrided this today down to something that looks viable and reapplied the collagen. If this is recurrent will need to change the [employer]) Signed: 09/16/2019 4:21:14 PM By: Linton Ham MD Entered By: Linton Ham on 09/16/2019 15:19:14 [name] [name] ([id number]) -------------------------------------------------------------------------------- SuperBill Details Patient Name: [name] [name] Date of Service: 09/16/2019 Medical Record Number: [id number] Patient Account Number: [id number] Date of Birth/Sex: [birth year] (58 y.o. 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 I10 Essential (primary) hypertension I25.10 Atherosclerotic heart disease of native coronary artery without angina pectoris N18.30 Chronic kidney disease, stage 3 unspecified L84 Corns and callosities L97.811 Non-pressure chronic ulcer of other part of right lower leg limited to breakdown of skin Facility Procedures CPT4 Code: 75916384 Description: 66599 - DEB SUBQ TISSUE 20 SQ CM/< Modifier: Quantity: 1 CPT4 Code: Description: ICD-10 Diagnosis Description L97.811 Non-pressure chronic ulcer of other part of right lower leg  limited to break Modifier: down of skin Quantity: Physician Procedures CPT4 Code: 3570177 Description: 11042 - WC PHYS SUBQ TISS 20 SQ CM Modifier: Quantity: 1 CPT4 Code: Description: ICD-10 Diagnosis Description L97.811 Non-pressure chronic ulcer of other part of right lower leg limited to breakd Modifier: own of skin Quantity: Electronic Signature(s) Signed: 09/16/2019 4:21:14 PM By: Linton Ham MD Entered By: Linton Ham on 09/16/2019 15:20:40

## 2019-09-16 NOTE — Progress Notes (Signed)
DRESHON, PROFFIT ([id number]) Visit Report for 09/16/2019 Arrival Information Details Patient Name: TRISTRAM, MILIAN Date of Service: 09/16/2019 1:45 PM Medical Record Number: [id number] Patient Account Number: [id number] Date of Birth/Sex: [birth year] (59 y.o. M) Treating RN: [name] Primary Care Shemika Robbs: [name] Other Clinician: Referring Suha Schoenbeck: [name] Treating Kamilah Correia/Extender: Tito Dine in Treatment: 8 Visit Information History Since Last Visit Added or deleted any medications: No Patient Arrived: Cane Any new allergies or adverse reactions: No Arrival Time: 13:37 Had a fall or experienced change in No Accompanied By: self activities of daily living that may affect Transfer Assistance: None risk of falls: Patient Identification Verified: Yes Signs or symptoms of abuse/neglect since last visito No Secondary Verification Process Completed: Yes Hospitalized since last visit: No Implantable device outside of the clinic excluding No cellular tissue based products placed in the center since last visit: Has Dressing in Place as Prescribed: Yes Pain Present Now: No Electronic Signature(s) Signed: 09/16/2019 4:06:52 PM By: Lorine Bears RCP, RRT, CHT Entered By: Lorine Bears on 09/16/2019 13:41:00 [name] ([id number]) -------------------------------------------------------------------------------- Encounter Discharge Information Details Patient Name: [name] Date of Service: 09/16/2019 1:45 PM Medical Record Number: [id number] Patient Account Number: [id number] Date of Birth/Sex: [birth year] (58 y.o. M) Treating RN: [name] Primary Care Aviel Davalos: [name] Other Clinician: Referring Elexa Kivi: [name] Treating Tyler Cubit/Extender: Tito Dine in Treatment: 8 Encounter Discharge Information Items Post Procedure Vitals Discharge Condition: Stable Temperature (F): 98.4 Ambulatory  Status: Ambulatory Pulse (bpm): 69 Discharge Destination: Home Respiratory Rate (breaths/min): 16 Transportation: Private Auto Blood Pressure (mmHg): 190/80 Accompanied By: self Schedule Follow-up Appointment: Yes Clinical Summary of Care: Electronic Signature(s) Signed: 09/16/2019 3:53:36 PM By: [name] Entered By: [name] on 09/16/2019 14:25:38 [name] ([id number]) -------------------------------------------------------------------------------- Lower Extremity Assessment Details Patient Name: [name] Date of Service: 09/16/2019 1:45 PM Medical Record Number: [id number] Patient Account Number: [id number] Date of Birth/Sex: 05-14-1961 (58 y.o. M) Treating RN: [name] Primary Care Dacota Ruben: [name] Other Clinician: Referring Cleland Simkins: [name] Treating Shuayb Schepers/Extender: Ricard Dillon Weeks in Treatment: 8 Edema Assessment Assessed: [Left: No] [Right: No] Edema: [Left: Ye] [Right: s] Vascular Assessment Pulses: Dorsalis Pedis Palpable: [Right:Yes] Electronic Signature(s) Signed: 09/16/2019 4:06:41 PM By: [name] Entered By: [name] on 09/16/2019 13:49:04 [name] ([id number]) -------------------------------------------------------------------------------- Multi Wound Chart Details Patient Name: [name] Date of Service: 09/16/2019 1:45 PM Medical Record Number: [id number] Patient Account Number: [id number] Date of Birth/Sex: Mar 06, 1961 (58 y.o. M) Treating RN: [name] Primary Care Doyce Stonehouse: [name] Other Clinician: Referring Alford Gamero: [name] Treating Cahterine Heinzel/Extender: Tito Dine in Treatment: 8 Vital Signs Height(in): 74 Pulse(bpm): 77 Weight(lbs): 221 Blood Pressure(mmHg): 190/80 Body Mass Index(BMI): 28 Temperature(F): 98.4 Respiratory Rate(breaths/min): 16 Photos: Wound Location: Right Toe Great Right, Distal, Lateral Lower Leg Right, Proximal, Lateral Lower  Leg Wounding Event: Gradually Appeared Gradually Appeared Gradually Appeared Primary Etiology: Diabetic Wound/Ulcer of the Lower Venous Leg Ulcer Venous Leg Ulcer Extremity Comorbid History: Sleep Apnea, Congestive Heart Sleep Apnea, Congestive Heart Sleep Apnea, Congestive Heart Failure, Coronary Artery Disease, Failure, Coronary Artery Disease, Failure, Coronary Artery Disease, Hypertension, Peripheral Venous Hypertension, Peripheral Venous Hypertension, Peripheral Venous Disease, Type II Diabetes, History of Disease, Type II Diabetes, History of Disease, Type II Diabetes, History of Burn, Osteoarthritis, Neuropathy Burn, Osteoarthritis, Neuropathy Burn, Osteoarthritis, Neuropathy Date Acquired: 07/07/2019 08/31/2019 09/01/2019 Weeks of Treatment: 8 2 2  Wound Status: Open Open Open Pending Amputation on Yes No No Presentation: Measurements L x W x D (cm)  0.2x0.1x0.1 1.1x2.2x0.1 0.4x0.5x0.1 Area (cm) : 0.016 1.901 0.157 Volume (cm) : 0.002 0.19 0.016 % Reduction in Area: 48.40% 42.40% 77.30% % Reduction in Volume: 77.80% 42.40% 76.80% Classification: Grade 1 Full Thickness Without Exposed Full Thickness Without Exposed Support Structures Support Structures Exudate Amount: Medium Medium Medium Exudate Type: Sanguinous Serous Serous Exudate Color: red amber amber Wound Margin: Flat and Intact Flat and Intact Flat and Intact Granulation Amount: Large (67-100%) Small (1-33%) Medium (34-66%) Granulation Quality: Pink Pink Pink Necrotic Amount: None Present (0%) Large (67-100%) Medium (34-66%) Exposed Structures: Fat Layer (Subcutaneous Tissue) Fat Layer (Subcutaneous Tissue) Fat Layer (Subcutaneous Tissue) Exposed: Yes Exposed: Yes Exposed: Yes Fascia: No Fascia: No Fascia: No Tendon: No Tendon: No Tendon: No Muscle: No Muscle: No Muscle: No Joint: No Joint: No Joint: No Bone: No Bone: No Bone: No Epithelialization: None None Small (1-33%) Debridement: N/A Debridement -  Excisional Debridement - Excisional Pre-procedure Verification/Time N/A 14:20 14:20 Out Taken: DAILEN, MCCLISH ([id number]) Pain Control: N/A Lidocaine Lidocaine Tissue Debrided: N/A Subcutaneous Subcutaneous Level: N/A Skin/Subcutaneous Tissue Skin/Subcutaneous Tissue Debridement Area (sq cm): N/A 2.42 0.2 Instrument: N/A Curette Curette Bleeding: N/A Minimum Minimum Hemostasis Achieved: N/A Pressure Pressure Debridement Treatment N/A Procedure was tolerated well Procedure was tolerated well Response: Post Debridement Measurements N/A 1.1x2.2x0.1 0.4x0.5x0.1 L x W x D (cm) Post Debridement Volume: (cm) N/A 0.19 0.016 Procedures Performed: N/A Debridement Debridement Treatment Notes Wound #1 (Right Toe Great) Notes collagen, conform on toe, Collagen BFD on leg wounds Wound #3 (Right, Distal, Lateral Lower Leg) Notes collagen, conform on toe, Collagen BFD on leg wounds Wound #4 (Right, Proximal, Lateral Lower Leg) Notes collagen, conform on toe, Collagen BFD on leg wounds Electronic Signature(s) Signed: 09/16/2019 4:21:14 PM By: Linton Ham MD Entered By: Linton Ham on 09/16/2019 14:51:52 [name] ([id number]) -------------------------------------------------------------------------------- Multi-Disciplinary Care Plan Details Patient Name: [name] Date of Service: 09/16/2019 1:45 PM Medical Record Number: [id number] Patient Account Number: [id number] Date of Birth/Sex: April 17, 1961 (58 y.o. M) Treating RN: [name] Primary Care Maia Handa: [name] Other Clinician: Referring Quan Cybulski: [name] Treating Bain Whichard/Extender: Tito Dine in Treatment: 8 Active Inactive Orientation to the Wound Care Program Nursing Diagnoses: Knowledge deficit related to the wound healing center program Goals: Patient/caregiver will verbalize understanding of the [place] Program Date Initiated: 07/22/2019 Target Resolution Date:  08/06/2019 Goal Status: Active Interventions: Provide education on orientation to the wound center Notes: Wound/Skin Impairment Nursing Diagnoses: Impaired tissue integrity Goals: Ulcer/skin breakdown will have a volume reduction of 30% by week 4 Date Initiated: 07/22/2019 Target Resolution Date: 08/17/2019 Goal Status: Active Interventions: Assess ulceration(s) every visit Notes: Electronic Signature(s) Signed: 09/16/2019 3:53:36 PM By: [name] Entered By: [name] on 09/16/2019 14:17:55 [name] ([id number]) -------------------------------------------------------------------------------- Pain Assessment Details Patient Name: [name] Date of Service: 09/16/2019 1:45 PM Medical Record Number: [id number] Patient Account Number: [id number] Date of Birth/Sex: 18-Nov-1960 (59 y.o. M) Treating RN: [name] Primary Care Artrell Lawless: [name] Other Clinician: Referring Lucienne Sawyers: [name] Treating Reon Hunley/Extender: Tito Dine in Treatment: 8 Active Problems Location of Pain Severity and Description of Pain Patient Has Paino No Site Locations Pain Management and Medication Current Pain Management: Electronic Signature(s) Signed: 09/16/2019 4:06:41 PM By: [name] Entered By: [name] on 09/16/2019 13:46:24 [name] ([id number]) -------------------------------------------------------------------------------- Patient/Caregiver Education Details Patient Name: [name] Date of Service: 09/16/2019 1:45 PM Medical Record Number: [id number] Patient Account Number: [id number] Date of Birth/Gender: 07/13/1960 (58 y.o. M) Treating RN: [name] Primary Care Physician:  [name] Other Clinician: Referring Physician: [name] Treating Physician/Extender: Tito Dine in Treatment: 8 Education Assessment Education Provided To: Patient Education Topics Provided Wound/Skin Impairment: Handouts: Caring  for Your Ulcer Methods: Demonstration, Explain/Verbal Responses: State content correctly Electronic Signature(s) Signed: 09/16/2019 3:53:36 PM By: [name] Entered By: [name] on 09/16/2019 14:24:19 [name] ([id number]) -------------------------------------------------------------------------------- Wound Assessment Details Patient Name: [name] Date of Service: 09/16/2019 1:45 PM Medical Record Number: [id number] Patient Account Number: [id number] Date of Birth/Sex: 09/23/1960 (59 y.o. M) Treating RN: [name] Primary Care Letizia Hook: [name] Other Clinician: Referring Tianah Lonardo: [name] Treating Geza Beranek/Extender: Tito Dine in Treatment: 8 Wound Status Wound Number: 1 Primary Diabetic Wound/Ulcer of the Lower Extremity Etiology: Wound Location: Right Toe Great Wound Open Wounding Event: Gradually Appeared Status: Date Acquired: 07/07/2019 Comorbid Sleep Apnea, Congestive Heart Failure, Coronary Artery Weeks Of Treatment: 8 History: Disease, Hypertension, Peripheral Venous Disease, Type II Clustered Wound: No Diabetes, History of Burn, Osteoarthritis, Neuropathy Pending Amputation On Presentation Photos Wound Measurements Length: (cm) 0.2 % Re Width: (cm) 0.1 % Re Depth: (cm) 0.1 Epit Area: (cm) 0.016 Tun Volume: (cm) 0.002 Und duction in Area: 48.4% duction in Volume: 77.8% helialization: None neling: No ermining: No Wound Description Classification: Grade 1 Foul Wound Margin: Flat and Intact Slou Exudate Amount: Medium Exudate Type: Sanguinous Exudate Color: red Odor After Cleansing: No gh/Fibrino Yes Wound Bed Granulation Amount: Large (67-100%) Exposed Structure Granulation Quality: Pink Fascia Exposed: No Necrotic Amount: None Present (0%) Fat Layer (Subcutaneous Tissue) Exposed: Yes Tendon Exposed: No Muscle Exposed: No Joint Exposed: No Bone Exposed: No Treatment Notes Wound #1 (Right Toe  Great) Notes collagen, conform on toe, Collagen BFD on leg wounds Electronic Signature(s) CHANCELLOR, VANDERLOOP ([id number]) Signed: 09/16/2019 1:51:26 PM By: [name] Entered By: [name] on 09/16/2019 13:51:25 [name] ([id number]) -------------------------------------------------------------------------------- Wound Assessment Details Patient Name: [name] Date of Service: 09/16/2019 1:45 PM Medical Record Number: [id number] Patient Account Number: [id number] Date of Birth/Sex: 31-Oct-1960 (59 y.o. M) Treating RN: [name] Primary Care Trixie Maclaren: [name] Other Clinician: Referring Ashle Stief: [name] Treating Tiera Mensinger/Extender: Tito Dine in Treatment: 8 Wound Status Wound Number: 3 Primary Venous Leg Ulcer Etiology: Wound Location: Right, Distal, Lateral Lower Leg Wound Open Wounding Event: Gradually Appeared Status: Date Acquired: 08/31/2019 Comorbid Sleep Apnea, Congestive Heart Failure, Coronary Artery Weeks Of Treatment: 2 History: Disease, Hypertension, Peripheral Venous Disease, Type II Clustered Wound: No Diabetes, History of Burn, Osteoarthritis, Neuropathy Photos Wound Measurements Length: (cm) 1.1 Width: (cm) 2.2 Depth: (cm) 0.1 Area: (cm) 1.901 Volume: (cm) 0.19 % Reduction in Area: 42.4% % Reduction in Volume: 42.4% Epithelialization: None Tunneling: No Undermining: No Wound Description Classification: Full Thickness Without Exposed Support Structu Wound Margin: Flat and Intact Exudate Amount: Medium Exudate Type: Serous Exudate Color: amber res Foul Odor After Cleansing: No Slough/Fibrino Yes Wound Bed Granulation Amount: Small (1-33%) Exposed Structure Granulation Quality: Pink Fascia Exposed: No Necrotic Amount: Large (67-100%) Fat Layer (Subcutaneous Tissue) Exposed: Yes Necrotic Quality: Adherent Slough Tendon Exposed: No Muscle Exposed: No Joint Exposed: No Bone Exposed: No Treatment  Notes Wound #3 (Right, Distal, Lateral Lower Leg) Notes collagen, conform on toe, Collagen BFD on leg wounds Electronic Signature(s) ABRON, NEDDO ([id number]) Signed: 09/16/2019 1:51:53 PM By: [name] Entered By: [name] on 09/16/2019 13:51:52 [name] ([id number]) -------------------------------------------------------------------------------- Wound Assessment Details Patient Name: [name] Date of Service: 09/16/2019 1:45 PM Medical Record Number: [id number] Patient Account Number: [id number] Date of Birth/Sex: 06-24-1960 (58  y.o. M) Treating RN: [name] Primary Care Vaunda Gutterman: [name] Other Clinician: Referring Daud Cayer: [name] Treating Nechemia Chiappetta/Extender: Tito Dine in Treatment: 8 Wound Status Wound Number: 4 Primary Venous Leg Ulcer Etiology: Wound Location: Right, Proximal, Lateral Lower Leg Wound Open Wounding Event: Gradually Appeared Status: Date Acquired: 09/01/2019 Comorbid Sleep Apnea, Congestive Heart Failure, Coronary Artery Weeks Of Treatment: 2 History: Disease, Hypertension, Peripheral Venous Disease, Type II Clustered Wound: No Diabetes, History of Burn, Osteoarthritis, Neuropathy Photos Wound Measurements Length: (cm) 0.4 Width: (cm) 0.5 Depth: (cm) 0.1 Area: (cm) 0.157 Volume: (cm) 0.016 % Reduction in Area: 77.3% % Reduction in Volume: 76.8% Epithelialization: Small (1-33%) Tunneling: No Undermining: No Wound Description Classification: Full Thickness Without Exposed Support Structu Wound Margin: Flat and Intact Exudate Amount: Medium Exudate Type: Serous Exudate Color: amber res Foul Odor After Cleansing: No Slough/Fibrino Yes Wound Bed Granulation Amount: Medium (34-66%) Exposed Structure Granulation Quality: Pink Fascia Exposed: No Necrotic Amount: Medium (34-66%) Fat Layer (Subcutaneous Tissue) Exposed: Yes Necrotic Quality: Adherent Slough Tendon Exposed: No Muscle Exposed:  No Joint Exposed: No Bone Exposed: No Treatment Notes Wound #4 (Right, Proximal, Lateral Lower Leg) Notes collagen, conform on toe, Collagen BFD on leg wounds Electronic Signature(s) STEVEN, BASSO ([id number]) Signed: 09/16/2019 1:53:06 PM By: [name] Entered By: [name] on 09/16/2019 13:53:06 [name] ([id number]) -------------------------------------------------------------------------------- Vitals Details Patient Name: [name] Date of Service: 09/16/2019 1:45 PM Medical Record Number: [id number] Patient Account Number: [id number] Date of Birth/Sex: 04/24/1961 (59 y.o. M) Treating RN: [name] Primary Care Aaira Oestreicher: [name] Other Clinician: Referring Camyra Vaeth: [name] Treating Johara Lodwick/Extender: Tito Dine in Treatment: 8 Vital Signs Time Taken: 13:40 Temperature (F): 98.4 Height (in): 74 Pulse (bpm): 69 Weight (lbs): 221 Respiratory Rate (breaths/min): 16 Body Mass Index (BMI): 28.4 Blood Pressure (mmHg): 190/80 Reference Range: 80 - 120 mg / dl Electronic Signature(s) Signed: 09/16/2019 4:06:52 PM By: Lorine Bears RCP, RRT, CHT Entered By: Lorine Bears on 09/16/2019 13:45:02

## 2019-09-20 ENCOUNTER — Ambulatory Visit: Payer: Managed Care, Other (non HMO) | Admitting: Internal Medicine

## 2019-09-20 ENCOUNTER — Telehealth: Payer: Self-pay

## 2019-09-20 NOTE — Telephone Encounter (Signed)
CONFIRMED PATIENT PFT APPT

## 2019-09-22 ENCOUNTER — Other Ambulatory Visit (INDEPENDENT_AMBULATORY_CARE_PROVIDER_SITE_OTHER): Payer: Self-pay | Admitting: Student in an Organized Health Care Education/Training Program

## 2019-09-22 ENCOUNTER — Other Ambulatory Visit: Payer: Self-pay

## 2019-09-22 ENCOUNTER — Ambulatory Visit: Payer: 59 | Admitting: Internal Medicine

## 2019-09-22 DIAGNOSIS — R0602 Shortness of breath: Secondary | ICD-10-CM

## 2019-09-22 DIAGNOSIS — J449 Chronic obstructive pulmonary disease, unspecified: Secondary | ICD-10-CM

## 2019-09-22 LAB — PULMONARY FUNCTION TEST

## 2019-09-23 ENCOUNTER — Encounter: Payer: 59 | Admitting: Physician Assistant

## 2019-09-23 ENCOUNTER — Other Ambulatory Visit
Admission: RE | Admit: 2019-09-23 | Discharge: 2019-09-23 | Disposition: A | Discharge status: Home / Self Care | Payer: 59 | Source: Ambulatory Visit | Attending: Physician Assistant | Admitting: Physician Assistant

## 2019-09-23 DIAGNOSIS — L089 Local infection of the skin and subcutaneous tissue, unspecified: Secondary | ICD-10-CM | POA: Diagnosis not present

## 2019-09-23 DIAGNOSIS — E11621 Type 2 diabetes mellitus with foot ulcer: Secondary | ICD-10-CM | POA: Diagnosis not present

## 2019-09-23 NOTE — Progress Notes (Addendum)
QUAYSHAWN, NIN ([id number]) Visit Report for 09/23/2019 Arrival Information Details Patient Name: [name] [name], [name] [name] Date of Service: 09/23/2019 10:15 AM Medical Record Number: [id number] Patient Account Number: [id number] Date of Birth/Sex: January 31, 1961 (59 y.o. M) Treating RN: [name] Primary Care Kaulana Brindle: [name] Other Clinician: Referring Jerrico Covello: [name] Treating Declin Rajan/Extender: [name], [name]: 9 Visit Information History Since Last Visit Added or deleted any medications: No Patient Arrived: Cane Any new allergies or adverse reactions: No Arrival Time: 10:48 Had a fall or experienced change in No Accompanied By: self activities of daily living that may affect Transfer Assistance: None risk of falls: Patient Identification Verified: Yes Signs or symptoms of abuse/neglect since last visito No Secondary Verification Process Completed: Yes Hospitalized since last visit: No Implantable device outside of the clinic excluding No cellular tissue based products placed in the center since last visit: Has Dressing in Place as Prescribed: Yes Pain Present Now: No Electronic Signature(s) Signed: 09/23/2019 4:42:54 PM By: [name] Entered By: [name] on 09/23/2019 10:48:35 [name] [name] ([id number]) -------------------------------------------------------------------------------- Encounter Discharge Information Details Patient Name: [name] [name] Date of Service: 09/23/2019 10:15 AM Medical Record Number: [id number] Patient Account Number: [id number] Date of Birth/Sex: 02/21/1961 (59 y.o. M) Treating RN: [name] Primary Care Tempestt Silba: [name] Other Clinician: Referring Lilliauna Van: [name] Treating Adrien Dietzman/Extender: [name], [name]: 9 Encounter Discharge Information Items Post Procedure Vitals Discharge Condition: Stable Temperature (F): 98.4 Ambulatory Status: Ambulatory Pulse (bpm): 66 Discharge  Destination: Home Respiratory Rate (breaths/min): 16 Transportation: Private Auto Blood Pressure (mmHg): 140/65 Accompanied By: self Schedule Follow-up Appointment: Yes Clinical Summary of Care: Electronic Signature(s) Signed: 09/23/2019 12:47:05 PM By: [name] Entered By: [name] on 09/23/2019 11:18:53 [name] [name] ([id number]) -------------------------------------------------------------------------------- Lower Extremity Assessment Details Patient Name: [name] [name] Date of Service: 09/23/2019 10:15 AM Medical Record Number: [id number] Patient Account Number: [id number] Date of Birth/Sex: Jul 14, 1960 (59 y.o. M) Treating RN: [name] Primary Care English Tomer: [name] Other Clinician: Referring Kensley Valladares: [name] Treating Linet Brash/Extender: [name], [name]: 9 Edema Assessment Assessed: [Left: No] [Right: No] Edema: [Left: N] [Right: o] Vascular Assessment Pulses: Dorsalis Pedis Palpable: [Right:Yes] Electronic Signature(s) Signed: 09/23/2019 4:42:54 PM By: [name] Entered By: [name] on 09/23/2019 10:57:13 [name] [name] ([id number]) -------------------------------------------------------------------------------- Multi Wound Chart Details Patient Name: [name] [name] Date of Service: 09/23/2019 10:15 AM Medical Record Number: [id number] Patient Account Number: [id number] Date of Birth/Sex: Feb 28, 1961 (59 y.o. 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 Comorbid History: Sleep Apnea, Congestive Heart Sleep Apnea, Congestive Heart Sleep Apnea, Congestive Heart Failure, Coronary Artery Disease, Failure, Coronary Artery Disease, Failure, Coronary Artery Disease, Hypertension, Peripheral Venous Hypertension, Peripheral Venous Hypertension, Peripheral Venous Disease, Type II Diabetes, History Disease, Type II Diabetes, History Disease, Type II Diabetes, History of Burn, Osteoarthritis, Neuropathy of Burn, Osteoarthritis, Neuropathy of Burn, Osteoarthritis, Neuropathy Date Acquired: 07/07/2019 08/31/2019 09/01/2019 Weeks of Treatment: 9 3 3  Wound Status: Open Open Open Pending Amputation on Yes No No Presentation: Measurements L x W x D (cm) 0.1x0.1x0.1 1.4x2.1x0.2 0.4x0.4x0.2 Area (cm) :  0.008 2.309 0.126 Volume (cm) : 0.001 0.462 0.025 % Reduction in Area: 74.20% 30.00% 81.80% % Reduction in Volume: 88.90% -40.00% 63.80% Classification: Grade 1 Full Thickness Without Exposed Full Thickness Without Exposed Support Structures Support Structures Exudate Amount: None Present Medium Medium Exudate Type: N/A Serous Serous Exudate Color: N/A amber amber Wound Margin: Flat and Intact Flat and Intact Flat and Intact Granulation Amount: None Present (0%) Small (1-33%) Small (1-33%) Granulation Quality: N/A Pink Pink Necrotic Amount: Large (67-100%) Large (67-100%) Large (67-100%) Necrotic Tissue: Melvern Exposed Structures: Fat Layer (Subcutaneous Tissue) Fat Layer (Subcutaneous Tissue) Fat Layer (Subcutaneous Tissue) Exposed: Yes Exposed: Yes Exposed: Yes Fascia: No Fascia: No Fascia: No Tendon: No Tendon: No Tendon: No Muscle: No Muscle: No Muscle: No Joint: No Joint: No Joint: No Bone: No Bone: No Bone: No Epithelialization: None None Small (1-33%) Treatment Notes HAKEEM, FRAZZINI ([id number]) Electronic Signature(s) Signed: 09/23/2019 12:47:05 PM By: [name] Entered By: [name] on 09/23/2019 11:09:29 [name] [name] ([id number]) -------------------------------------------------------------------------------- [place] Details Patient Name: [name] [name] Date of Service: 09/23/2019 10:15 AM Medical Record Number: [id number] Patient Account Number: [id number] Date of Birth/Sex: 04-09-1961 (59 y.o. M) Treating RN: [name] Primary Care Wardell Pokorski: [name] Other Clinician: Referring Erinn Mendosa: [name] Treating Siearra Amberg/Extender: [name], [name]: 9 Active Inactive Orientation to the Wound Care Program Nursing Diagnoses: Knowledge deficit related to the wound healing center program Goals: Patient/caregiver will verbalize understanding of the [place] Program Date Initiated: 07/22/2019 Target Resolution Date: 08/06/2019 Goal Status: Active Interventions: Provide education on orientation to the wound center Notes: Wound/Skin Impairment Nursing Diagnoses: Impaired tissue integrity Goals: Ulcer/skin breakdown will have a volume reduction of 30% by week 4 Date Initiated: 07/22/2019 Target Resolution Date: 08/17/2019 Goal Status: Active Interventions: Assess ulceration(s) every visit Notes: Electronic Signature(s) Signed: 09/23/2019 12:47:05 PM By: [name] Entered By: [name] on 09/23/2019 11:09:22 [name] [name] ([id number]) -------------------------------------------------------------------------------- Pain Assessment Details Patient Name: [name] [name] Date of Service: 09/23/2019 10:15 AM Medical Record Number: [id number] Patient Account Number: [id number] Date of Birth/Sex: 05/08/1961 (59 y.o. M) Treating RN: [name] Primary Care Donis Kotowski: [name] Other Clinician: Referring Afiya Ferrebee: [name] Treating Chinonso Linker/Extender: [name], [name] Weeks in  Treatment: 9 Active Problems Location of Pain Severity and Description of Pain Patient Has Paino No Site Locations Pain Management and Medication Current Pain Management: Electronic Signature(s) Signed: 09/23/2019 4:42:54 PM By: [name] Entered By: [name] on 09/23/2019 10:49:08 [name] [name] ([id number]) -------------------------------------------------------------------------------- Patient/Caregiver Education Details Patient Name: [name] [name] Date of Service: 09/23/2019 10:15 AM Medical Record Number: [id number] Patient Account Number: [id number] Date of Birth/Gender: [birth year] (58 y.o. M) Treating RN: [name] Primary Care Physician: [name] Other Clinician: Referring Physician: [name] Treating Physician/Extender: Sharalyn Ink in Treatment: 9 Education Assessment Education Provided To: Patient Education Topics Provided Wound/Skin Impairment: Handouts: Caring for Your Ulcer Methods: Demonstration, Explain/Verbal Responses: State content correctly Electronic Signature(s) Signed: 09/23/2019 12:47:05 PM By: [name] Entered By: [name] on 09/23/2019 11:17:54 [name] [name] ([id number]) -------------------------------------------------------------------------------- Wound Assessment Details Patient Name: [name] [name] Date of Service: 09/23/2019 10:15 AM Medical Record Number: [id number] Patient Account Number: [id number] Date of Birth/Sex: 07/16/1960 (58 y.o. M) Treating RN: [name] Primary Care Cherrelle Plante: [name] Other Clinician: Referring Philip Eckersley: [name] Treating Shyquan Stallbaumer/Extender: [name], [name]: 9 Wound Status Wound Number: 1 Primary Diabetic Wound/Ulcer of the Lower Extremity Etiology: Wound Location: Right Toe Great Wound Open Wounding Event: Gradually  Appeared Status: Date Acquired: 07/07/2019 Comorbid Sleep Apnea, Congestive Heart Failure, Coronary Artery Weeks Of  Treatment: 9 History: Disease, Hypertension, Peripheral Venous Disease, Type II Clustered Wound: No Diabetes, History of Burn, Osteoarthritis, Neuropathy Pending Amputation On Presentation Photos Wound Measurements Length: (cm) 0.1 Width: (cm) 0.1 Depth: (cm) 0.1 Area: (cm) 0.008 Volume: (cm) 0.001 % Reduction in Area: 74.2% % Reduction in Volume: 88.9% Epithelialization: None Tunneling: No Undermining: No Wound Description Classification: Grade 1 Wound Margin: Flat and Intact Exudate Amount: None Present Foul Odor After Cleansing: No Slough/Fibrino No Wound Bed Granulation Amount: None Present (0%) Exposed Structure Necrotic Amount: Large (67-100%) Fascia Exposed: No Necrotic Quality: Eschar Fat Layer (Subcutaneous Tissue) Exposed: Yes Tendon Exposed: No Muscle Exposed: No Joint Exposed: No Bone Exposed: No Treatment Notes Wound #1 (Right Toe Great) Notes xerform on toe with coverlet, Scell with telfa on leg Electronic Signature(s) Signed: 09/23/2019 4:42:54 PM By: Roxy Manns, Nehemiah Massed ([id number]) Entered By: [name] on 09/23/2019 10:55:02 [name] [name] ([id number]) -------------------------------------------------------------------------------- Wound Assessment Details Patient Name: [name] [name] Date of Service: 09/23/2019 10:15 AM Medical Record Number: [id number] Patient Account Number: [id number] Date of Birth/Sex: 09/10/1960 (59 y.o. M) Treating RN: [name] Primary Care Cormac Wint: [name] Other Clinician: Referring Hilda Rynders: [name] Treating Karrin Eisenmenger/Extender: [name], [name]: 9 Wound Status Wound Number: 3 Primary Venous Leg Ulcer Etiology: Wound Location: Right, Distal, Lateral Lower Leg Wound Open Wounding Event: Gradually Appeared Status: Date Acquired: 08/31/2019 Comorbid Sleep Apnea, Congestive Heart Failure, Coronary Artery Weeks Of Treatment: 3 History: Disease, Hypertension, Peripheral  Venous Disease, Type II Clustered Wound: No Diabetes, History of Burn, Osteoarthritis, Neuropathy Photos Wound Measurements Length: (cm) 1.4 Width: (cm) 2.1 Depth: (cm) 0.2 Area: (cm) 2.309 Volume: (cm) 0.462 % Reduction in Area: 30% % Reduction in Volume: -40% Epithelialization: None Tunneling: No Undermining: No Wound Description Classification: Full Thickness Without Exposed Support Struct Wound Margin: Flat and Intact Exudate Amount: Medium Exudate Type: Serous Exudate Color: amber ures Foul Odor After Cleansing: No Slough/Fibrino Yes Wound Bed Granulation Amount: Small (1-33%) Exposed Structure Granulation Quality: Pink Fascia Exposed: No Necrotic Amount: Large (67-100%) Fat Layer (Subcutaneous Tissue) Exposed: Yes Necrotic Quality: Adherent Slough Tendon Exposed: No Muscle Exposed: No Joint Exposed: No Bone Exposed: No Treatment Notes Wound #3 (Right, Distal, Lateral Lower Leg) Notes xerform on toe with coverlet, Scell with telfa on leg Electronic Signature(s) KAMEN, HANKEN ([id number]) Signed: 09/23/2019 4:42:54 PM By: [name] Entered By: [name] on 09/23/2019 10:55:35 [name] [name] ([id number]) -------------------------------------------------------------------------------- Wound Assessment Details Patient Name: [name] [name] Date of Service: 09/23/2019 10:15 AM Medical Record Number: [id number] Patient Account Number: [id number] Date of Birth/Sex: 08-May-1961 (59 y.o. M) Treating RN: [name] Primary Care Gratia Disla: [name] Other Clinician: Referring Beautifull Cisar: [name] Treating Reneta Niehaus/Extender: [name], [name]: 9 Wound Status Wound Number: 4 Primary Venous Leg Ulcer Etiology: Wound Location: Right, Proximal, Lateral Lower Leg Wound Open Wounding Event: Gradually Appeared Status: Date Acquired: 09/01/2019 Comorbid Sleep Apnea, Congestive Heart Failure, Coronary Artery Weeks Of Treatment:  3 History: Disease, Hypertension, Peripheral Venous Disease, Type II Clustered Wound: No Diabetes, History of Burn, Osteoarthritis, Neuropathy Photos Wound Measurements Length: (cm) 0.4 Width: (cm) 0.4 Depth: (cm) 0.2 Area: (cm) 0.126 Volume: (cm) 0.025 % Reduction in Area: 81.8% % Reduction in Volume: 63.8% Epithelialization: Small (1-33%) Tunneling: No Undermining: No Wound Description Classification: Full Thickness Without Exposed Support Struct Wound Margin: Flat and Intact Exudate Amount: Medium Exudate Type: Serous Exudate Color: amber ures Foul Odor After  Cleansing: No Slough/Fibrino Yes Wound Bed Granulation Amount: Small (1-33%) Exposed Structure Granulation Quality: Pink Fascia Exposed: No Necrotic Amount: Large (67-100%) Fat Layer (Subcutaneous Tissue) Exposed: Yes Necrotic Quality: Adherent Slough Tendon Exposed: No Muscle Exposed: No Joint Exposed: No Bone Exposed: No Treatment Notes Wound #4 (Right, Proximal, Lateral Lower Leg) Notes xerform on toe with coverlet, Scell with telfa on leg Electronic Signature(s) CHINEDUM, VANHOUTEN ([id number]) Signed: 09/23/2019 4:42:54 PM By: [name] Entered By: [name] on 09/23/2019 10:56:01 [name] [name] ([id number]) -------------------------------------------------------------------------------- Vitals Details Patient Name: [name] [name] Date of Service: 09/23/2019 10:15 AM Medical Record Number: [id number] Patient Account Number: [id number] Date of Birth/Sex: Oct 19, 1960 (58 y.o. M) Treating RN: [name] Primary Care Elynn Patteson: [name] Other Clinician: Referring Janayia Burggraf: [name] Treating Twania Bujak/Extender: [name], [name]: 9 Vital Signs Time Taken: 10:48 Temperature (F): 98.4 Height (in): 74 Pulse (bpm): 66 Weight (lbs): 221 Respiratory Rate (breaths/min): 16 Body Mass Index (BMI): 28.4 Blood Pressure (mmHg): 140/65 Reference Range: 80 - 120 mg /  dl Electronic Signature(s) Signed: 09/23/2019 4:42:54 PM By: [name] Entered By: [name] on 09/23/2019 10:49:01

## 2019-09-23 NOTE — Progress Notes (Addendum)
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 mention that there could be an autoimmune bullous disease that could also be of consideration here but again the biopsy was limited in making this determination. Further dermatology follow-up may be necessary if the patient was interested. The patient however does not want to proceed any further with this at this point. 4/22; this is a patient who has a wound on his right plantar toe this is just about closed. Apparently since he has been here he arrived with blisters on the right lateral lower leg these of opened into the wounds. There was some discussion about whether he needed to be seen by dermatology for evaluation of an autoimmune blistering disease. The patient was not interested in any still not interested. We have been using collagen to all areas. 09/23/2019 upon evaluation today patient appears to be doing well with regard to his toe ulcer this is very close to complete resolution. Unfortunately in regard to his legs appears to show signs of cellulitis at this point. There is no signs of systemic infection which is good news but locally this is definitely infected. He states he noted this 1  Day after he came in last week for his wound care appointment. DANILE, TRIER ([id number]) Objective Constitutional Well-nourished and well-hydrated in no acute distress. Vitals Time Taken: 10:48 AM, Height: 74 in, Weight: 221 lbs, BMI: 28.4, Temperature: 98.4 F, Pulse: 66 bpm, Respiratory Rate: 16 breaths/min, Blood Pressure: 140/65 mmHg. Respiratory normal breathing without difficulty. Psychiatric this patient is able to make decisions and demonstrates good insight into disease process. Alert and Oriented x 3. pleasant and cooperative. General Notes: Upon inspection patient's toe ulcer actually shows just a very small pinpoint opening remaining there is literally almost nothing left open here and that appears to be doing great. I did perform some debridement of the callus around the edges of this wound but there was no sharp debridement otherwise necessary today. With regard to the leg ulcers however I did have to perform debridement of not only some of the eschar but also slough from the surface of the wound down to good subcutaneous tissue and the patient tolerated that today without complication. Post debridement the wound beds appear to be doing better at all locations. There is definite cellulitis noted on the distal lower extremity ulcer which I am good have to address as well. Integumentary (Hair, Skin) Wound #1 status is Open. Original cause of wound was Gradually Appeared. The wound is located on the Right Toe Great. The wound measures 0.1cm length x 0.1cm width x 0.1cm depth; 0.008cm^2 area and 0.001cm^3 volume. There is Fat Layer (Subcutaneous Tissue) Exposed exposed. There is no tunneling or undermining noted. There is a none present amount of drainage noted. The wound margin is flat and intact. There is no granulation within the wound bed. There is a large (67-100%) amount of necrotic tissue within the wound bed including Eschar. Wound #3 status is Open. Original cause of  wound was Gradually Appeared. The wound is located on the Right,Distal,Lateral Lower Leg. The wound measures 1.4cm length x 2.1cm  width x 0.2cm depth; 2.309cm^2 area and 0.462cm^3 volume. There is Fat Layer (Subcutaneous Tissue) Exposed exposed. There is no tunneling or undermining noted. There is a medium amount of serous drainage noted. The wound margin is flat and intact. There is small (1-33%) pink granulation within the wound bed. There is a large (67-100%) amount of necrotic tissue within the wound bed including Adherent Slough. Wound #4 status is Open. Original cause of wound was Gradually Appeared. The wound is located on the Right,Proximal,Lateral Lower Leg. The wound measures 0.4cm length x 0.4cm width x 0.2cm depth; 0.126cm^2 area and 0.025cm^3 volume. There is Fat Layer (Subcutaneous Tissue) Exposed exposed. There is no tunneling or undermining noted. There is a medium amount of serous drainage noted. The wound margin is flat and intact. There is small (1-33%) pink granulation within the wound bed. There is a large (67-100%) amount of necrotic tissue within the wound bed including Adherent Slough. 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 There was a Selective/Open Wound Non-Viable Tissue Debridement with a total area of 0.01 sq cm  performed by [name], Brode Sculley E., PA-C. With the following instrument(s): Curette to remove Viable and Non-Viable tissue/material. Material removed includes Callus after achieving pain control using Lidocaine. A time out was conducted at 11:09, prior to the start of the procedure. A Minimum amount of bleeding was controlled with Pressure. The procedure was tolerated well. Post Debridement Measurements: 0.1cm length x 0.1cm width x 0.1cm depth; 0.001cm^3 volume. KARIS, EMIG ([id number]) Character of Wound/Ulcer Post Debridement is stable. Severity of Tissue Post Debridement is: Fat layer exposed. Post procedure Diagnosis Wound #1: Same as Pre-Procedure Wound #3 Pre-procedure diagnosis of Wound #3 is a Venous Leg Ulcer located on the Right,Distal,Lateral Lower Leg .Severity of Tissue Pre Debridement is: Fat layer exposed. There was a Excisional Skin/Subcutaneous Tissue Debridement with a total area of 2.94 sq cm performed by [name], Rajohn Henery E., PA-C. With the following instrument(s): Curette to remove Viable and Non-Viable tissue/material. Material removed includes Subcutaneous Tissue and Slough and after achieving pain control using Lidocaine. A time out was conducted at 11:09, prior to the start of the procedure. A Minimum amount of bleeding was controlled with Pressure. The procedure was tolerated well. Post Debridement Measurements: 1.4cm length x 2.1cm width x 0.2cm depth; 0.462cm^3 volume. Character of Wound/Ulcer Post Debridement is stable. Severity of Tissue Post Debridement is: Fat layer exposed. Post procedure Diagnosis Wound #3: Same as Pre-Procedure Wound #4 Pre-procedure diagnosis of Wound #4 is a Venous Leg Ulcer located on the Right,Proximal,Lateral Lower Leg .Severity of Tissue Pre Debridement is: Fat layer exposed. There was a Excisional Skin/Subcutaneous Tissue Debridement with a total area of 0.16 sq cm performed by [name], Charlise Giovanetti E., PA-C. With the following instrument(s):  Curette to remove Viable and Non-Viable tissue/material. Material removed includes Subcutaneous Tissue and Slough and after achieving pain control using Lidocaine. A time out was conducted at 11:09, prior to the start of  the procedure. A Minimum amount of bleeding was controlled with Pressure. The procedure was tolerated well. Post Debridement Measurements: 0.4cm length x 0.4cm width x 0.2cm depth; 0.025cm^3 volume. Character of Wound/Ulcer Post Debridement is stable. Severity of Tissue Post Debridement is: Fat layer exposed. Post procedure Diagnosis Wound #4: Same as Pre-Procedure Plan Wound Cleansing: Wound #1 Right Toe Great: Clean wound with Normal Saline. - In office Dial antibacterial soap, wash wounds, rinse and pat dry prior to dressing wounds Wound #3 Right,Distal,Lateral Lower Leg: Clean wound with Normal Saline. - In office Dial antibacterial soap, wash wounds, rinse and pat dry prior to dressing wounds Wound #4 Right,Proximal,Lateral Lower Leg: Clean wound with Normal Saline. - In office Dial antibacterial soap, wash wounds, rinse and pat dry prior to dressing wounds Primary Wound Dressing: Wound #1 Right Toe Great: Xeroform Wound #3 Right,Distal,Lateral Lower Leg: Silver Alginate Wound #4 Right,Proximal,Lateral Lower Leg: Silver Alginate Secondary Dressing: Wound #1 Right Toe Great: Other - coverlet Wound #3 Right,Distal,Lateral Lower Leg: [place] Wound #4 Right,Proximal,Lateral Lower Leg: [place] Dressing Change Frequency: Wound #1 Right Toe Great: Change dressing every other day. Wound #3 Right,Distal,Lateral Lower Leg: Change dressing every other day. Wound #4 Right,Proximal,Lateral Lower Leg: Change dressing every other day. Follow-up Appointments: Wound #1 Right Toe Great: Return Appointment in 1 week. Wound #3 Right,Distal,Lateral Lower Leg: Return Appointment in 1 week. Wound #4 Right,Proximal,Lateral Lower Leg: Return Appointment in 1  week. Off-Loading: Wound #1 Right Toe Great: Open toe surgical shoe with peg assist. Wound #3 Right,Distal,Lateral Lower Leg: Open toe surgical shoe with peg assist. Wound #4 Right,Proximal,Lateral Lower Leg: Open toe surgical shoe with peg assist. Laboratory ordered were: MARTELL, MCFADYEN ([id number]) Wound culture routine - Right leg The following medication(s) was prescribed: Levaquin oral 500 mg tablet 1 1 tablet oral taken 1 time per day for 14 days starting 09/23/2019 gentamicin topical 0.1 % cream cream topical applied every other day with dressing changes as directed in the clinic starting 09/23/2019 1. At this time did recommend that we initiate a change in the dressing orders. I am going to recommend Xeroform gauze dressing for the toe ulcer as we just need to keep this somewhat moist and allow this to hopefully completely epithelialized. The patient is in agreement with that plan. 2. With regard to the leg ulcers we can use gentamicin cream followed by a silver alginate dressing I think that is good to do well for him to be honest. 3. I do recommend that he continue to change the dressings every other day in order to keep things clean he can wash the leg with Dial antibacterial soap. 4. With regard to the leg ulcer I did perform a culture today that we will send for evaluation. In the meantime I am going to place him on gentamicin cream for the wounds and then subsequently oral Levaquin as well to see if we can get this under control. It appeared to have a green drainage tent to it which has me concerned about the possibility of Pseudomonas. We will see patient back for reevaluation in 1 week here in the clinic. If anything worsens or changes patient will contact our office for additional recommendations. Electronic Signature(s) Signed: 09/23/2019 11:21:11 AM By: Worthy Keeler PA-C Previous Signature: 09/23/2019 11:18:03 AM Version By: Worthy Keeler PA-C Entered By: Worthy Keeler on 09/23/2019 11:21:11 [name] [name] ([id number]) -------------------------------------------------------------------------------- SuperBill Details Patient Name: [name] [name] Date of Service: 09/23/2019 Medical Record Number: [id number] Patient Account Number:  [id number] Date of Birth/Sex: May 19, 1961 (58 y.o. M) Treating RN: [name] Primary Care Provider: [name] Other Clinician: Referring Provider: [name] Treating Provider/Extender: [name], Mikeyla Music Weeks in Treatment: 9 Diagnosis Coding ICD-10 Codes Code Description E11.621 Type 2 diabetes mellitus with foot ulcer L97.512 Non-pressure chronic ulcer of other part of right foot with fat layer exposed I50.42 Chronic combined systolic (congestive) and diastolic (congestive) heart failure I10 Essential (primary) hypertension I25.10 Atherosclerotic heart disease of native coronary artery without angina pectoris N18.30 Chronic kidney disease, stage 3 unspecified L84 Corns and callosities L97.811 Non-pressure chronic ulcer of other part of right lower leg limited to breakdown of skin Facility Procedures CPT4 Code: 58309407 Description: 11042 - DEB SUBQ TISSUE 20 SQ CM/< Modifier: Quantity: 1 CPT4 Code: Description: ICD-10 Diagnosis Description L97.811 Non-pressure chronic ulcer of other part of right lower leg limited to brea Modifier: kdown of skin Quantity: CPT4 Code: 68088110 Description: 31594 - DEBRIDE WOUND 1ST 20 SQ CM OR < Modifier: Quantity: 1 CPT4 Code: Description: ICD-10 Diagnosis Description L97.512 Non-pressure chronic ulcer of other part of right foot with fat layer expos Modifier: ed Quantity: Physician Procedures CPT4 Code: 5859292 Description: 99214 - WC PHYS LEVEL 4 - EST PT Modifier: 25 Quantity: 1 CPT4 Code: Description: ICD-10 Diagnosis Description E11.621 Type 2 diabetes mellitus with foot ulcer L97.512 Non-pressure chronic ulcer of other part of right foot with fat layer expos  I50.42 Chronic combined systolic (congestive) and diastolic (congestive) heart  fai I10 Essential (primary) hypertension Modifier: ed lure Quantity: CPT4 Code: 4462863 Description: 11042 - WC PHYS SUBQ TISS 20 SQ CM Modifier: Quantity: 1 CPT4 Code: Description: ICD-10 Diagnosis Description L97.811 Non-pressure chronic ulcer of other part of right lower leg limited to brea Modifier: kdown of skin Quantity: CPT4 Code: 8177116 Description: 57903 - WC PHYS DEBR WO ANESTH 20 SQ CM Modifier: Quantity: 1 CPT4 Code: Description: ICD-10 Diagnosis Description L97.512 Non-pressure chronic ulcer of other part of right foot with fat layer expos Modifier: ed Quantity: Electronic Signature(s) Signed: 09/23/2019 11:21:49 AM By: Worthy Keeler PA-C Entered By: Worthy Keeler on 09/23/2019 11:21:48

## 2019-09-24 ENCOUNTER — Ambulatory Visit (INDEPENDENT_AMBULATORY_CARE_PROVIDER_SITE_OTHER): Payer: Managed Care, Other (non HMO) | Admitting: Nurse Practitioner

## 2019-09-24 ENCOUNTER — Encounter (INDEPENDENT_AMBULATORY_CARE_PROVIDER_SITE_OTHER): Payer: Managed Care, Other (non HMO)

## 2019-09-25 LAB — AEROBIC CULTURE W GRAM STAIN (SUPERFICIAL SPECIMEN): Gram Stain: NONE SEEN

## 2019-09-27 ENCOUNTER — Ambulatory Visit (INDEPENDENT_AMBULATORY_CARE_PROVIDER_SITE_OTHER): Payer: Managed Care, Other (non HMO) | Admitting: Nurse Practitioner

## 2019-09-27 ENCOUNTER — Ambulatory Visit (INDEPENDENT_AMBULATORY_CARE_PROVIDER_SITE_OTHER): Payer: 59

## 2019-09-27 ENCOUNTER — Other Ambulatory Visit: Payer: Self-pay

## 2019-09-27 ENCOUNTER — Encounter (INDEPENDENT_AMBULATORY_CARE_PROVIDER_SITE_OTHER): Payer: Self-pay | Admitting: Nurse Practitioner

## 2019-09-27 VITALS — BP 168/75 | HR 68 | Resp 16 | Wt 218.0 lb

## 2019-09-27 DIAGNOSIS — I890 Lymphedema, not elsewhere classified: Secondary | ICD-10-CM | POA: Diagnosis not present

## 2019-09-27 DIAGNOSIS — I739 Peripheral vascular disease, unspecified: Secondary | ICD-10-CM

## 2019-09-27 DIAGNOSIS — I10 Essential (primary) hypertension: Secondary | ICD-10-CM

## 2019-09-27 DIAGNOSIS — M7989 Other specified soft tissue disorders: Secondary | ICD-10-CM | POA: Diagnosis not present

## 2019-09-27 NOTE — Progress Notes (Signed)
Subjective:    Patient ID: [name] [name], male    DOB: 1960/06/03, 59 y.o.   MRN: [id number] Chief Complaint  Patient presents with  . Follow-up    ultrasound follow up    The patient returns to the office for followup evaluation regarding leg swelling.  The swelling has persisted and the pain associated with swelling continues.  The patient has developed a small blood blister near the ankle of his right lower extremity.  This is being currently treated by the wound center.  Since the previous visit the patient has been wearing graduated compression stockings and has noted little if any improvement in the lymphedema. The patient has been using compression routinely morning until night.  The patient also states elevation during the day and exercise is being done too.  Despite these conservative therapy efforts the patient continues to have notable lymphedema.  Recent noninvasive studies with his cardiologist revealed a 55% ejection fraction.  However, the patient does complain of lower extremity weakness.  He states that after undergoing physical activity he has the sudden need to just stop as if he cannot go on.  He denies any rest pain like symptoms.  Today the patient underwent noninvasive studies to evaluate for his lower extremity swelling.  Today no evidence of DVT was detected bilaterally.  No evidence of superficial venous reflux was seen in the bilateral saphenous veins, great or short.  No evidence of deep venous insufficiency was seen bilaterally.   Review of Systems  Cardiovascular: Positive for leg swelling.  Skin: Positive for wound.  Neurological: Positive for weakness.  All other systems reviewed and are negative.      Objective:   Physical Exam Vitals reviewed.  Constitutional:      Appearance: Normal appearance.  Cardiovascular:     Rate and Rhythm: Normal rate.     Pulses:          Dorsalis pedis pulses are 1+ on the left side.       Posterior tibial pulses  are 1+ on the left side.  Musculoskeletal:     Right lower leg: 2+ Pitting Edema present.     Left lower leg: 1+ Edema present.  Skin:    Findings: Wound present.     Comments: Dermal thickening noted bilaterally  Neurological:     Mental Status: He is alert and oriented to person, place, and time.  Psychiatric:        Mood and Affect: Mood normal.        Behavior: Behavior normal.        Thought Content: Thought content normal.        Judgment: Judgment normal.     BP (!) 168/75 (BP Location: Left Arm)   Pulse 68   Resp 16   Wt 218 lb (98.9 kg)   BMI 27.99 kg/m   Past Medical History:  Diagnosis Date  . Arrhythmia   . Arthritis   . CAD (coronary artery disease)   . Cardiomyopathy, ischemic   . CHF (congestive heart failure) (HCC)    NYHA II-III  . Colon polyps   . Diabetes ([place])   . Dupre's syndrome   . GERD (gastroesophageal reflux disease)   . Hyperlipidemia   . Hypertension   . Migraine   . Neuropathy   . Peripheral vascular disease ([place])   . Retinopathy   . Sleep apnea   . Stage 3 chronic kidney disease   . Stomach ulcer     Social  History   Socioeconomic History  . Marital status: Married    Spouse name: Not on file  . Number of children: Not on file  . Years of education: Not on file  . Highest education level: Not on file  Occupational History  . Not on file  Tobacco Use  . Smoking status: Former Smoker    Types: Cigarettes    Quit date: 05/18/2012    Years since quitting: 7.3  . Smokeless tobacco: Never Used  Substance and Sexual Activity  . Alcohol use: Not Currently    Comment: occasional   . Drug use: Never  . Sexual activity: Not on file  Other Topics Concern  . Not on file  Social History Narrative  . Not on file   Social Determinants of Health   Financial Resource Strain:   . Difficulty of Paying Living Expenses:   Food Insecurity:   . Worried About Charity fundraiser in the Last Year:   . Arboriculturist in the Last Year:    Transportation Needs:   . Film/video editor (Medical):   Marland Kitchen Lack of Transportation (Non-Medical):   Physical Activity:   . Days of Exercise per Week:   . Minutes of Exercise per Session:   Stress:   . Feeling of Stress :   Social Connections:   . Frequency of Communication with Friends and Family:   . Frequency of Social Gatherings with Friends and Family:   . Attends Religious Services:   . Active Member of Clubs or Organizations:   . Attends Archivist Meetings:   Marland Kitchen Marital Status:   Intimate Partner Violence:   . Fear of Current or Ex-Partner:   . Emotionally Abused:   Marland Kitchen Physically Abused:   . Sexually Abused:     Past Surgical History:  Procedure Laterality Date  . bone graft surgery    . CARDIAC CATHETERIZATION  2013   S/p PCI   . CARDIAC CATHETERIZATION  2018   S/p CABG  . CARDIAC SURGERY    . CORONARY ARTERY BYPASS GRAFT  2018   (LIMA-LAD,VG-RCA,VG-OM1,VG-D1)  . EYE SURGERY    . PERIPHERAL VASCULAR CATHETERIZATION Right 10/28/2016   PTA/DEB Right SFA  . WRIST SURGERY      Family History  Problem Relation Age of Onset  . Diabetes Mother   . Heart disease Father     Allergies  Allergen Reactions  . Other Other (See Comments)    Pain  With joint stiffness    . Statins     Pain  With joint stiffness   . Atorvastatin Other (See Comments)    Muscle aches Muscle aches  . Pravastatin Other (See Comments)    Muscle aches Muscle aches  . Pregabalin Other (See Comments)    Generalized aches and pains Generalized aches and pains  . Rosuvastatin Other (See Comments)    Muscle Aches Other reaction(s): JOINT PAIN Other reaction(s): JOINT PAIN Muscle Aches        Assessment & Plan:   1. Lymphedema Recommend:  No surgery or intervention at this point in time.    I have reviewed my previous discussion with the patient regarding swelling and why it causes symptoms.  Patient will continue wearing graduated compression stockings class 1  (20-30 mmHg) on a daily basis. The patient will  beginning wearing the stockings first thing in the morning and removing them in the evening. The patient is instructed specifically not to sleep in the stockings.  In addition, behavioral modification including several periods of elevation of the lower extremities during the day will be continued.  This was reviewed with the patient during the initial visit.  The patient will also continue routine exercise, especially walking on a daily basis as was discussed during the initial visit.    Despite conservative treatments of more than 4 weeks, including graduated compression therapy class 1 and behavioral modification including exercise and elevation the patient  has not obtained adequate control of the lymphedema.  The patient still has stage 3 lymphedema and therefore, I believe that a lymph pump should be added to improve the control of the patient's lymphedema.  Additionally, a lymph pump is warranted because it will reduce the risk of cellulitis and ulceration in the future.  Patient should follow-up in six months    2. PAD (peripheral artery disease) ([place]) Recent visit with the patient's cardiologist notes that he had previous studies done at an outside cardiologist office that indicated that he had some right iliac artery disease.  The patient does complain of weakness in his bilateral lower extremities in addition to some slow healing wounds.  The patient also has several atherosclerotic risk factors.  We will have the patient return to the office at his convenience for noninvasive studies. - VAS US AORTA/IVC/ILIACS; Future - VAS [place] ABI WITH/WO TBI; Future  3. Essential hypertension Continue antihypertensive medications as already ordered, these medications have been reviewed and there are no changes at this time.    Current Outpatient Medications on File Prior to Visit  Medication Sig Dispense Refill  . albuterol (VENTOLIN HFA) 108 (90  Base) MCG/ACT inhaler Inhale 2 puffs into the lungs every 6 (six) hours as needed for wheezing or shortness of breath. 18 g 3  . carvedilol (COREG) 12.5 MG tablet Take 12.5 mg by mouth 2 (two) times daily with a meal.    . Evolocumab (REPATHA SURECLICK) 371 MG/ML SOAJ Inject 140 mg into the skin every 14 (fourteen) days.    Marland Kitchen ezetimibe (ZETIA) 10 MG tablet Take 10 mg by mouth daily.    . fluticasone furoate-vilanterol (BREO ELLIPTA) 100-25 MCG/INH AEPB Inhale 1 puff into the lungs daily. 1 each 3  . hydrALAZINE (APRESOLINE) 50 MG tablet Take 1 tablet (50 mg total) by mouth 3 (three) times daily. 90 tablet 6  . insulin degludec (TRESIBA FLEXTOUCH) 100 UNIT/ML SOPN FlexTouch Pen Inject 140 Units into the skin daily.    . insulin lispro (HUMALOG KWIKPEN) 100 UNIT/ML KwikPen Inject into the skin. 0-20 units per sliding scale    . pantoprazole (PROTONIX) 40 MG tablet Take 40 mg by mouth daily.    Marland Kitchen torsemide (DEMADEX) 20 MG tablet Take 2 tablets (40 mg total) by mouth 2 (two) times daily. Take 2 tablets (40 mg) daily with 2 tablets (40 mg) in the afternoon as needed 360 tablet 3   No current facility-administered medications on file prior to visit.    There are no Patient Instructions on file for this visit. No follow-ups on file.   Kris Hartmann, NP

## 2019-09-28 ENCOUNTER — Telehealth: Payer: Self-pay

## 2019-09-28 NOTE — Telephone Encounter (Signed)
error 

## 2019-09-28 NOTE — Telephone Encounter (Signed)
Called lmom informing patient of appointment on

## 2019-09-30 ENCOUNTER — Ambulatory Visit: Payer: 59 | Admitting: Internal Medicine

## 2019-09-30 ENCOUNTER — Other Ambulatory Visit: Payer: Self-pay

## 2019-09-30 ENCOUNTER — Encounter: Payer: 59 | Admitting: Physician Assistant

## 2019-09-30 ENCOUNTER — Encounter: Payer: Self-pay | Admitting: Internal Medicine

## 2019-09-30 VITALS — BP 198/92 | HR 73 | Temp 97.2°F | Resp 16 | Ht 74.0 in | Wt 215.0 lb

## 2019-09-30 DIAGNOSIS — E1140 Type 2 diabetes mellitus with diabetic neuropathy, unspecified: Secondary | ICD-10-CM

## 2019-09-30 DIAGNOSIS — I509 Heart failure, unspecified: Secondary | ICD-10-CM | POA: Diagnosis not present

## 2019-09-30 DIAGNOSIS — R0602 Shortness of breath: Secondary | ICD-10-CM | POA: Diagnosis not present

## 2019-09-30 DIAGNOSIS — J986 Disorders of diaphragm: Secondary | ICD-10-CM

## 2019-09-30 DIAGNOSIS — G4733 Obstructive sleep apnea (adult) (pediatric): Secondary | ICD-10-CM | POA: Diagnosis not present

## 2019-09-30 DIAGNOSIS — Z794 Long term (current) use of insulin: Secondary | ICD-10-CM

## 2019-09-30 NOTE — Progress Notes (Signed)
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 monitor 3. Elevated Hemidiaphragm sniff test was ordered to evaluate the right hemidiaphragm it looks somewhat elevated and could be actually even paralyzed which could explain why he gets more short of breath when he lays down. 4. 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 OSA follow-up sleep study has been ordered he has not been using his CPAP as prescribed and I think if he gets back on his CPAP this too will help his breathing especially at nighttime 6. CKD III monitoring labs we will continue with supportive care  General Counseling: I have discussed the findings of the evaluation and examination with Nehemiah Massed.  I have also discussed any further diagnostic evaluation thatmay be needed or ordered today. Samy verbalizes understanding of the findings of todays visit. We also reviewed his medications today and discussed drug interactions and side effects including but not limited excessive drowsiness and altered mental states. We also discussed that there is always a risk not just to him but also people around him. he has been encouraged to call the office with any questions or concerns that should arise  related to todays visit.  Orders Placed This Encounter  Procedures  . PSG Sleep Study    Standing Status:   Future    Standing Expiration Date:   09/29/2020    Order Specific Question:   Where should this test be performed:    Answer:   [facility]     Time spent: 21  I have personally obtained a history, examined the patient, evaluated laboratory and imaging results, formulated the assessment and plan and placed orders.    Allyne Gee, MD [facility] Pulmonary and Critical Care Sleep medicine

## 2019-09-30 NOTE — Patient Instructions (Signed)
Sleep Study    The sleep study consists of a recording of your brain waves (EEG). Breathing, heart rate and rhythm (ECG), oxygen level, eye movement, and leg movement.  The technician will glue or or paste several electrodes to your scalp, face, chest and legs.  You will have belts around your chest and abdomen to record breathing and a finger clasp to check blood oxygen levels.  A tube at your mouth and nose will detect airflow.  There are no needle sticks or painful procedures of any sort.  You will have your own room, and we will make every effort to attend to your comfort and privacy.  Please prepare for your study by the following steps:   Please avoid coffee, tea, soda, chocolate and other caffeine foods or beverages after 12:00 noon on the day of your sleep study.   You must arrive with clean (no oils), conditioners or make up, and please make sure that you wash your hair to ensure that your hair and scalp are clean, dry and free of any hair extensions on the day of your study.  This will help to get a good reading of study.  Please try not to nap on the day of your study.  Please bring a list of all your medications.  Bring any medications that you might need during the time you are within the laboratory, including insulin, sleeping pills, pain medication and anxiety medications.  Bring snacks, water or juice  Please bring clothes to sleep in and your normal overnight bag.  Please leave valuable at home, as we will not be responsible for any lost items.  If you have any further questions, please feel free to call our office. Thank you  **Please call our office 48 in advance to cancel or reschedule to avoid a $100.00 early cancel or no show fee**

## 2019-10-01 ENCOUNTER — Other Ambulatory Visit (INDEPENDENT_AMBULATORY_CARE_PROVIDER_SITE_OTHER): Payer: Managed Care, Other (non HMO)

## 2019-10-01 ENCOUNTER — Encounter: Payer: 59 | Attending: Physician Assistant | Admitting: Physician Assistant

## 2019-10-01 DIAGNOSIS — L97512 Non-pressure chronic ulcer of other part of right foot with fat layer exposed: Secondary | ICD-10-CM | POA: Diagnosis not present

## 2019-10-01 DIAGNOSIS — N1832 Chronic kidney disease, stage 3b: Secondary | ICD-10-CM

## 2019-10-01 DIAGNOSIS — I5042 Chronic combined systolic (congestive) and diastolic (congestive) heart failure: Secondary | ICD-10-CM | POA: Insufficient documentation

## 2019-10-01 DIAGNOSIS — M1990 Unspecified osteoarthritis, unspecified site: Secondary | ICD-10-CM | POA: Diagnosis not present

## 2019-10-01 DIAGNOSIS — N183 Chronic kidney disease, stage 3 unspecified: Secondary | ICD-10-CM | POA: Insufficient documentation

## 2019-10-01 DIAGNOSIS — G4730 Sleep apnea, unspecified: Secondary | ICD-10-CM | POA: Diagnosis not present

## 2019-10-01 DIAGNOSIS — R0609 Other forms of dyspnea: Secondary | ICD-10-CM

## 2019-10-01 DIAGNOSIS — I25118 Atherosclerotic heart disease of native coronary artery with other forms of angina pectoris: Secondary | ICD-10-CM | POA: Diagnosis not present

## 2019-10-01 DIAGNOSIS — E11621 Type 2 diabetes mellitus with foot ulcer: Secondary | ICD-10-CM | POA: Insufficient documentation

## 2019-10-01 DIAGNOSIS — I130 Hypertensive heart and chronic kidney disease with heart failure and stage 1 through stage 4 chronic kidney disease, or unspecified chronic kidney disease: Secondary | ICD-10-CM | POA: Insufficient documentation

## 2019-10-01 DIAGNOSIS — E1140 Type 2 diabetes mellitus with diabetic neuropathy, unspecified: Secondary | ICD-10-CM | POA: Diagnosis not present

## 2019-10-01 DIAGNOSIS — I2510 Atherosclerotic heart disease of native coronary artery without angina pectoris: Secondary | ICD-10-CM | POA: Insufficient documentation

## 2019-10-01 DIAGNOSIS — L97811 Non-pressure chronic ulcer of other part of right lower leg limited to breakdown of skin: Secondary | ICD-10-CM | POA: Diagnosis not present

## 2019-10-01 DIAGNOSIS — I255 Ischemic cardiomyopathy: Secondary | ICD-10-CM

## 2019-10-01 DIAGNOSIS — L84 Corns and callosities: Secondary | ICD-10-CM | POA: Insufficient documentation

## 2019-10-01 DIAGNOSIS — E1122 Type 2 diabetes mellitus with diabetic chronic kidney disease: Secondary | ICD-10-CM | POA: Diagnosis not present

## 2019-10-01 DIAGNOSIS — Z794 Long term (current) use of insulin: Secondary | ICD-10-CM

## 2019-10-01 NOTE — Progress Notes (Addendum)
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 Levaquin does not help with. With that being said the gentamicin seems to be doing excellent for him at this point I do not believe any other oral medication is necessary. Integumentary (Hair, Skin) Wound #1 status is Healed - Epithelialized. Original cause of wound was Gradually Appeared. The wound is located on the Right Toe Great. The wound measures 0cm length x 0cm width x 0cm depth; 0cm^2 area and 0cm^3 volume. There is Fat Layer (Subcutaneous Tissue) Exposed exposed. There is no tunneling or undermining noted. There is a none present amount of drainage noted. The wound margin is flat and intact. There is no granulation within the wound bed. There is a large (67-100%) amount of necrotic tissue within the wound bed including Eschar. Wound #3 status is Open. Original cause of wound was Gradually Appeared. The wound is located on the Right,Distal,Lateral Lower Leg. The wound measures 1.4cm length x 1.8cm width x 0.2cm depth; 1.979cm^2 area and 0.396cm^3 volume. There is Fat Layer (Subcutaneous Tissue) Exposed exposed. There is a medium amount of serous drainage noted. The wound margin is flat and intact. There is small (1-33%) pink granulation within the wound bed. There is a large  (67-100%) amount of necrotic tissue within the wound bed including Adherent Slough. Wound #4 status is Open. Original cause of wound was Gradually Appeared. The wound is located on the Right,Proximal,Lateral Lower Leg. The wound measures 0.2cm length x 0.2cm width x 0.1cm depth; 0.031cm^2 area and 0.003cm^3 volume. There is Fat Layer (Subcutaneous Tissue) Exposed exposed. There is a medium amount of serous drainage noted. The wound margin is flat and intact. There is small (1-33%) pink granulation within the wound bed. There is a large (67-100%) amount of necrotic tissue within the wound bed including Adherent Slough. 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 There was a Excisional Skin/Subcutaneous Tissue Debridement with a total area of 2.52 sq cm performed by [name], Anavi Branscum E., PA-C. With the following instrument(s): Curette to remove Viable and Non-Viable tissue/material. Material removed includes Subcutaneous Tissue and Slough and after achieving pain control using Lidocaine 4% Topical Solution. No specimens were taken. A time out was conducted at 08:46, prior to the start of the procedure. A Minimum amount of bleeding was controlled with Pressure. The procedure was tolerated  well with a pain level of 0 throughout and a pain level of 0 following the  procedure. Post Debridement Measurements: 1.4cm length x 1.8cm width x 0.3cm depth; 0.594cm^3 volume. [name] [name], [name] [name] ([id number]) Character of Wound/Ulcer Post Debridement is improved. Severity of Tissue Post Debridement is: Fat layer exposed. Post procedure Diagnosis Wound #3: Same as Pre-Procedure Wound #4 Pre-procedure diagnosis of Wound #4 is a Venous Leg Ulcer located on the Right,Proximal,Lateral Lower Leg .Severity of Tissue Pre Debridement is: Fat layer exposed. There was a Excisional Skin/Subcutaneous Tissue Debridement with a total area of 0.04 sq cm performed by [name], Mearl Harewood E., PA-C. With the following instrument(s): Curette to remove Viable and Non-Viable tissue/material. Material removed includes Subcutaneous Tissue and Slough and after achieving pain control using Lidocaine 4% Topical Solution. No specimens were taken. A time out was conducted at 08:44, prior to the start of the procedure. A Minimum amount of bleeding was controlled with Pressure. The procedure was tolerated well with a pain level of 0 throughout and a pain level of 0 following the procedure. Post Debridement Measurements: 0.2cm length x 0.2cm width x 0.2cm depth; 0.006cm^3 volume. Character of Wound/Ulcer Post Debridement is improved. Severity of Tissue Post Debridement is: Fat layer exposed. Post procedure Diagnosis Wound #4: Same as Pre-Procedure Plan Wound Cleansing: Wound #3 Right,Distal,Lateral Lower Leg: Clean wound with Normal Saline. - In office Dial antibacterial soap, wash wounds, rinse and pat dry prior to dressing wounds Wound #4 Right,Proximal,Lateral Lower Leg: Clean wound with Normal Saline. - In office Dial antibacterial soap, wash wounds, rinse and pat dry prior to dressing wounds Primary Wound Dressing: Wound #3 Right,Distal,Lateral Lower Leg: Gentamicin Sulfate Cream Silver Alginate Wound #4 Right,Proximal,Lateral Lower Leg: Gentamicin Sulfate Cream Silver Alginate Secondary  Dressing: Wound #3 Right,Distal,Lateral Lower Leg: [place] Wound #4 Right,Proximal,Lateral Lower Leg: [place] Dressing Change Frequency: Wound #3 Right,Distal,Lateral Lower Leg: Change dressing every day. Wound #4 Right,Proximal,Lateral Lower Leg: Change dressing every day. Follow-up Appointments: Wound #3 Right,Distal,Lateral Lower Leg: Return Appointment in 1 week. Wound #4 Right,Proximal,Lateral Lower Leg: Return Appointment in 1 week. Off-Loading: Wound #3 Right,Distal,Lateral Lower Leg: Open toe surgical shoe with peg assist. Wound #4 Right,Proximal,Lateral Lower Leg: Open toe surgical shoe with peg assist. 1. I would recommend currently that we continue with gentamicin followed by the silver alginate dressing to the patient's wound that seems doing very well for him. 2. Months ago I recommend that he continue to change the dressings every other day which I think is appropriate at this point. 3. I would recommend he continue with appropriate offloading in regard to his toe even though this is healed. We will see patient back for reevaluation in 1 week here in the clinic. If anything worsens or changes patient will contact our office for additional recommendations. Electronic Signature(s) Signed: 10/01/2019 8:54:44 AM By: Worthy Keeler PA-C Entered By: Worthy Keeler on 10/01/2019 08:54:44 [name] [name] ([id number]) -------------------------------------------------------------------------------- SuperBill Details Patient Name: [name] [name] Date of Service: 10/01/2019 Medical Record Number: [id number] Patient Account Number: [id number] Date of Birth/Sex: 1961-05-04 (58 y.o. M) Treating RN: [name] Primary Care Provider: [name] Other Clinician: Referring Provider: [name] Treating Provider/Extender: [name], Serapio Edelson Weeks in Treatment: 10 Diagnosis Coding ICD-10 Codes Code Description E11.621 Type 2 diabetes mellitus with foot ulcer L97.512  Non-pressure chronic ulcer of other part of right foot with fat layer exposed I50.42 Chronic combined systolic (congestive) and diastolic (congestive) heart failure I10 Essential (primary) hypertension I25.10 Atherosclerotic heart disease of native  coronary artery without angina pectoris N18.30 Chronic kidney disease, stage 3 unspecified L84 Corns and callosities L97.811 Non-pressure chronic ulcer of other part of right lower leg limited to breakdown of skin Facility Procedures CPT4 Code: [id number] Description: 47841 - DEB SUBQ TISSUE 20 SQ CM/< Modifier: Quantity: 1 CPT4 Code: Description: ICD-10 Diagnosis Description L97.811 Non-pressure chronic ulcer of other part of right lower leg limited to bre Modifier: akdown of skin Quantity: Physician Procedures CPT4 Code: [id number] Description: 11042 - WC PHYS SUBQ TISS 20 SQ CM Modifier: Quantity: 1 CPT4 Code: Description: ICD-10 Diagnosis Description L97.811 Non-pressure chronic ulcer of other part of right lower leg limited to bre Modifier: akdown of skin Quantity: Electronic Signature(s) Signed: 10/01/2019 8:55:00 AM By: Worthy Keeler PA-C Entered By: Worthy Keeler on 10/01/2019 08:54:59

## 2019-10-01 NOTE — Progress Notes (Signed)
[name] [name], [name] [name] ([id number]) Visit Report for 10/01/2019 Arrival Information Details Patient Name: [name] [name], [name] [name] Date of Service: 10/01/2019 8:30 AM Medical Record Number: [id number] Patient Account Number: [id number] Date of Birth/Sex: 1960/11/25 (59 y.o. M) Treating RN: [name] [name] Primary Care [name] [name]: [name] [name] Other Clinician: Referring [name] [name]: [name] [name] Treating [name] [name]/Extender: [name] [name], [name] [name]: 10 Visit Information History Since Last Visit Added or deleted any medications: No Patient Arrived: Ambulatory Any new allergies or adverse reactions: No Arrival Time: 08:29 Had a fall or experienced change in No Accompanied By: self activities of daily living that may affect Transfer Assistance: None risk of falls: Patient Identification Verified: Yes Signs or symptoms of abuse/neglect since last visito No Hospitalized since last visit: No Has Dressing in Place as Prescribed: Yes Pain Present Now: No Electronic Signature(s) Signed: 10/01/2019 10:52:34 AM By: [name] [name] Entered By: [name] [name] on 10/01/2019 08:29:22 [name] [name] ([id number]) -------------------------------------------------------------------------------- Encounter Discharge Information Details Patient Name: [name] [name] Date of Service: 10/01/2019 8:30 AM Medical Record Number: [id number] Patient Account Number: [id number] Date of Birth/Sex: 1961/05/06 (58 y.o. M) Treating RN: [name] [name] Primary Care [name] [name]: [name] [name] Other Clinician: Referring [name] [name]: [name] [name] Treating [name] [name]/Extender: [name] [name], [name] [name]: 10 Encounter Discharge Information Items Post Procedure Vitals Discharge Condition: Stable Temperature (F): 98.2 Ambulatory Status: Ambulatory Pulse (bpm): 72 Discharge Destination: Home Respiratory Rate (breaths/min): 16 Transportation: Private Auto Blood Pressure (mmHg): 171/84 Accompanied By: self Schedule Follow-up Appointment:  Yes Clinical Summary of Care: Electronic Signature(s) Signed: 10/01/2019 3:24:15 PM By: [name] [name] Entered By: [name] [name] on 10/01/2019 08:51:27 [name] [name] ([id number]) -------------------------------------------------------------------------------- Lower Extremity Assessment Details Patient Name: [name] [name] Date of Service: 10/01/2019 8:30 AM Medical Record Number: [id number] Patient Account Number: [id number] Date of Birth/Sex: November 06, 1960 (58 y.o. M) Treating RN: [name] [name] Primary Care [name] [name]: [name] [name] Other Clinician: Referring Kili Gracy: [name] [name] Treating [name] [name]/Extender: [name] [name], [name] [name]: 10 Edema Assessment Assessed: [Left: No] [Right: No] Edema: [Left: N] [Right: o] Vascular Assessment Pulses: Dorsalis Pedis Palpable: [Right:Yes] Electronic Signature(s) Signed: 10/01/2019 10:52:34 AM By: [name] [name] Entered By: [name] [name] on 10/01/2019 08:35:49 [name] [name] ([id number]) -------------------------------------------------------------------------------- Multi Wound Chart Details Patient Name: [name] [name] Date of Service: 10/01/2019 8:30 AM Medical Record Number: [id number] Patient Account Number: [id number] Date of Birth/Sex: 03-25-1961 (59 y.o. 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 Comorbid History: Sleep Apnea, Congestive Heart  Sleep Apnea, Congestive Heart Sleep Apnea, Congestive Heart Failure, Coronary Artery Disease, Failure, Coronary Artery Disease, Failure, Coronary Artery Disease, Hypertension, Peripheral Venous Hypertension, Peripheral Venous Hypertension, Peripheral Venous Disease, Type II Diabetes, History Disease, Type II Diabetes, History Disease, Type II Diabetes, History of Burn, Osteoarthritis, Neuropathy of Burn, Osteoarthritis, Neuropathy of Burn, Osteoarthritis, Neuropathy Date Acquired: 07/07/2019 08/31/2019 09/01/2019 Weeks of [name]: 10 4 4  Wound Status: Healed - Epithelialized Open Open Pending Amputation on Yes No No Presentation: Measurements L x W x D (cm) 0x0x0 1.4x1.8x0.2 0.2x0.2x0.1 Area (cm) : 0 1.979 0.031 Volume (cm) : 0 0.396 0.003 % Reduction in Area: 100.00% 40.00% 95.50% % Reduction in Volume: 100.00% -  20.00% 95.70% Classification: Grade 1 Full Thickness Without Exposed Full Thickness Without Exposed Support Structures Support Structures Exudate Amount: None Present Medium Medium Exudate Type: N/A Serous Serous Exudate Color: N/A amber amber Wound Margin: Flat and Intact Flat and Intact Flat and Intact Granulation Amount: None Present (0%) Small (1-33%) Small (1-33%) Granulation Quality: N/A Pink Pink Necrotic Amount: Large (67-100%) Large (67-100%) Large (67-100%) Necrotic Tissue: Eschar Adherent [place] Exposed Structures: Fat Layer (Subcutaneous Tissue) Fat Layer (Subcutaneous Tissue) Fat Layer (Subcutaneous Tissue) Exposed: Yes Exposed: Yes Exposed: Yes Fascia: No Fascia: No Fascia: No Tendon: No Tendon: No Tendon: No Muscle: No Muscle: No Muscle: No Joint: No Joint: No Joint: No Bone: No Bone: No Bone: No Epithelialization: None None Small (1-33%) Debridement: N/A Debridement - Excisional Debridement - Excisional Pre-procedure Verification/Time N/A 08:46 08:44 Out Taken: Pain Control: N/A Lidocaine 4% Topical Solution Lidocaine 4% Topical  Solution Tissue Debrided: N/A Subcutaneous, Slough Subcutaneous, Slough Level: N/A Skin/Subcutaneous Tissue Skin/Subcutaneous Tissue [name], [name] ([id number]) Debridement Area (sq cm): N/A 2.52 0.04 Instrument: N/A Curette Curette Bleeding: N/A Minimum Minimum Hemostasis Achieved: N/A Pressure Pressure Procedural Pain: N/A 0 0 Post Procedural Pain: N/A 0 0 Debridement [name] N/A Procedure was tolerated well Procedure was tolerated well Response: Post Debridement N/A 1.4x1.8x0.3 0.2x0.2x0.2 Measurements L x W x D (cm) Post Debridement Volume: N/A 0.594 0.006 (cm) Procedures Performed: N/A Debridement Debridement [name] Notes Electronic Signature(s) Signed: 10/01/2019 3:24:15 PM By: [name] [name] Entered By: [name] [name] on 10/01/2019 08:49:32 [name] [name] ([id number]) -------------------------------------------------------------------------------- Prospect Details Patient Name: [name] [name] Date of Service: 10/01/2019 8:30 AM Medical Record Number: [id number] Patient Account Number: [id number] Date of Birth/Sex: 12-12-1960 (58 y.o. M) Treating RN: [name] [name] Primary Care Marykatherine Sherwood: [name] [name] Other Clinician: Referring Zainab Crumrine: [name] [name] Treating Johnjoseph Rolfe/Extender: [name] [name], [name] [name]: 10 Active Inactive Orientation to the Wound Care Program Nursing Diagnoses: Knowledge deficit related to the wound healing center program Goals: Patient/caregiver will verbalize understanding of the [place] Program Date Initiated: 07/22/2019 Target Resolution Date: 08/06/2019 Goal Status: Active Interventions: Provide education on orientation to the wound center Notes: Wound/Skin Impairment Nursing Diagnoses: Impaired tissue integrity Goals: Ulcer/skin breakdown will have a volume reduction of 30% by week 4 Date Initiated: 07/22/2019 Target Resolution Date: 08/17/2019 Goal Status: Active Interventions: Assess  ulceration(s) every visit Notes: Electronic Signature(s) Signed: 10/01/2019 3:24:15 PM By: [name] [name] Entered By: [name] [name] on 10/01/2019 08:44:59 [name] [name] ([id number]) -------------------------------------------------------------------------------- Pain Assessment Details Patient Name: [name] [name] Date of Service: 10/01/2019 8:30 AM Medical Record Number: [id number] Patient Account Number: [id number] Date of Birth/Sex: 1961/02/12 (59 y.o. M) Treating RN: [name] [name] Primary Care Briseida Gittings: [name] [name] Other Clinician: Referring Sajan Cheatwood: [name] [name] Treating Chiara Coltrin/Extender: [name] [name], [name] [name]: 10 Active Problems Location of Pain Severity and Description of Pain Patient Has Paino No Site Locations Pain Management and Medication Current Pain Management: Electronic Signature(s) Signed: 10/01/2019 10:52:34 AM By: [name] [name] Entered By: [name] [name] on 10/01/2019 08:30:19 [name] [name] ([id number]) -------------------------------------------------------------------------------- Patient/Caregiver Education Details Patient Name: [name] [name] Date of Service: 10/01/2019 8:30 AM Medical Record Number: [id number] Patient Account Number: [id number] Date of Birth/Gender: Sep 11, 1960 (58 y.o. M) Treating RN: [name] [name] Primary Care Physician: [name] [name] Other Clinician: Referring Physician: Clayborn [name] Treating Physician/Extender: Sharalyn Ink in [name]: 10 Education Assessment Education Provided To: Patient Education Topics Provided Wound/Skin Impairment: Handouts: Other: wound care as ordered Methods: Explain/Verbal Responses: State content correctly Electronic Signature(s) Signed: 10/01/2019 3:24:15 PM By: [name] [name] Entered  By: [name] [name] on 10/01/2019 08:49:05 [name] [name] ([id number]) -------------------------------------------------------------------------------- Wound Assessment Details Patient Name:  [name] [name], [name] [name] Date of Service: 10/01/2019 8:30 AM Medical Record Number: [id number] Patient Account Number: [id number] Date of Birth/Sex: Sep 19, 1960 (58 y.o. M) Treating RN: [name] [name] Primary Care Mickey Esguerra: [name] [name] Other Clinician: Referring Azusena Erlandson: [name] [name] Treating Ruthie Berch/Extender: [name] [name], [name] [name]: 10 Wound Status Wound Number: 1 Primary Diabetic Wound/Ulcer of the Lower Extremity Etiology: Wound Location: Right Toe Great Wound Healed - Epithelialized Wounding Event: Gradually Appeared Status: Date Acquired: 07/07/2019 Comorbid Sleep Apnea, Congestive Heart Failure, Coronary Artery Weeks Of [name]: 10 History: Disease, Hypertension, Peripheral Venous Disease, Type II Clustered Wound: No Diabetes, History of Burn, Osteoarthritis, Neuropathy Pending Amputation On Presentation Photos Wound Measurements Length: (cm) 0 Width: (cm) 0 Depth: (cm) 0 Area: (cm) Volume: (cm) % Reduction in Area: 100% % Reduction in Volume: 100% Epithelialization: None 0 Tunneling: No 0 Undermining: No Wound Description Classification: Grade 1 Wound Margin: Flat and Intact Exudate Amount: None Present Foul Odor After Cleansing: No Slough/Fibrino No Wound Bed Granulation Amount: None Present (0%) Exposed Structure Necrotic Amount: Large (67-100%) Fascia Exposed: No Necrotic Quality: Eschar Fat Layer (Subcutaneous Tissue) Exposed: Yes Tendon Exposed: No Muscle Exposed: No Joint Exposed: No Bone Exposed: No Electronic Signature(s) Signed: 10/01/2019 3:24:15 PM By: [name] [name] Entered By: [name] [name] on 10/01/2019 08:49:22 [name] [name] ([id number]) -------------------------------------------------------------------------------- Wound Assessment Details Patient Name: [name] [name] Date of Service: 10/01/2019 8:30 AM Medical Record Number: [id number] Patient Account Number: [id number] Date of Birth/Sex: 29-Jun-1960 (58 y.o. M) Treating RN:  [name] [name] Primary Care Tamkia Temples: [name] [name] Other Clinician: Referring Samyra Limb: [name] [name] Treating Kyree Adriano/Extender: [name] [name], [name] [name]: 10 Wound Status Wound Number: 3 Primary Venous Leg Ulcer Etiology: Wound Location: Right, Distal, Lateral Lower Leg Wound Open Wounding Event: Gradually Appeared Status: Date Acquired: 08/31/2019 Comorbid Sleep Apnea, Congestive Heart Failure, Coronary Artery Weeks Of [name]: 4 History: Disease, Hypertension, Peripheral Venous Disease, Type II Clustered Wound: No Diabetes, History of Burn, Osteoarthritis, Neuropathy Photos Wound Measurements Length: (cm) 1.4 Width: (cm) 1.8 Depth: (cm) 0.2 Area: (cm) 1.979 Volume: (cm) 0.396 % Reduction in Area: 40% % Reduction in Volume: -20% Epithelialization: None Wound Description Classification: Full Thickness Without Exposed Support Structu Wound Margin: Flat and Intact Exudate Amount: Medium Exudate Type: Serous Exudate Color: amber res Foul Odor After Cleansing: No Slough/Fibrino Yes Wound Bed Granulation Amount: Small (1-33%) Exposed Structure Granulation Quality: Pink Fascia Exposed: No Necrotic Amount: Large (67-100%) Fat Layer (Subcutaneous Tissue) Exposed: Yes Necrotic Quality: Adherent Slough Tendon Exposed: No Muscle Exposed: No Joint Exposed: No Bone Exposed: No [name] Notes Wound #3 (Right, Distal, Lateral Lower Leg) Notes gentamycin and silvercel with BFD Electronic Signature(s) KOHAN, AZIZI ([id number]) Signed: 10/01/2019 10:52:34 AM By: [name] [name] Entered By: [name] [name] on 10/01/2019 08:34:33 [name] [name] ([id number]) -------------------------------------------------------------------------------- Wound Assessment Details Patient Name: [name] [name] Date of Service: 10/01/2019 8:30 AM Medical Record Number: [id number] Patient Account Number: [id number] Date of Birth/Sex: March 19, 1961 (59 y.o. M) Treating RN: [name] [name] Primary  Care Anfernee Peschke: [name] [name] Other Clinician: Referring Elbert Spickler: [name] [name] Treating Rigo Letts/Extender: [name] [name], [name] [name]: 10 Wound Status Wound Number: 4 Primary Venous Leg Ulcer Etiology: Wound Location: Right, Proximal, Lateral Lower Leg Wound Open Wounding Event: Gradually Appeared Status: Date Acquired: 09/01/2019 Comorbid Sleep Apnea, Congestive Heart Failure, Coronary Artery Weeks Of [name]: 4 History: Disease, Hypertension, Peripheral Venous Disease, Type II Clustered Wound: No Diabetes, History of Burn, Osteoarthritis, Neuropathy Photos Wound Measurements Length: (cm)  0.2 Width: (cm) 0.2 Depth: (cm) 0.1 Area: (cm) 0.031 Volume: (cm) 0.003 % Reduction in Area: 95.5% % Reduction in Volume: 95.7% Epithelialization: Small (1-33%) Wound Description Classification: Full Thickness Without Exposed Support Structu Wound Margin: Flat and Intact Exudate Amount: Medium Exudate Type: Serous Exudate Color: amber res Foul Odor After Cleansing: No Slough/Fibrino Yes Wound Bed Granulation Amount: Small (1-33%) Exposed Structure Granulation Quality: Pink Fascia Exposed: No Necrotic Amount: Large (67-100%) Fat Layer (Subcutaneous Tissue) Exposed: Yes Necrotic Quality: Adherent Slough Tendon Exposed: No Muscle Exposed: No Joint Exposed: No Bone Exposed: No [name] Notes Wound #4 (Right, Proximal, Lateral Lower Leg) Notes gentamycin and silvercel with BFD Electronic Signature(s) ANDREUS, CURE ([id number]) Signed: 10/01/2019 10:52:34 AM By: [name] [name] Entered By: [name] [name] on 10/01/2019 08:35:12 [name] [name] ([id number]) -------------------------------------------------------------------------------- Vitals Details Patient Name: [name] [name] Date of Service: 10/01/2019 8:30 AM Medical Record Number: [id number] Patient Account Number: [id number] Date of Birth/Sex: Dec 28, 1960 (59 y.o. M) Treating RN: [name] [name] Primary Care Kahliya Fraleigh:  [name] [name] Other Clinician: Referring Harshita Bernales: [name] [name] Treating Shonique Pelphrey/Extender: [name] [name], [name] [name]: 10 Vital Signs Time Taken: 08:29 Temperature (F): 98.7 Height (in): 74 Pulse (bpm): 72 Weight (lbs): 221 Respiratory Rate (breaths/min): 16 Body Mass Index (BMI): 28.4 Blood Pressure (mmHg): 171/84 Reference Range: 80 - 120 mg / dl Electronic Signature(s) Signed: 10/01/2019 10:52:34 AM By: [name] [name] Entered By: [name] [name] on 10/01/2019 08:29:51

## 2019-10-02 ENCOUNTER — Telehealth: Payer: Self-pay | Admitting: Internal Medicine

## 2019-10-02 LAB — COMPREHENSIVE METABOLIC PANEL
ALT: 25 IU/L (ref 0–44)
AST: 24 IU/L (ref 0–40)
Albumin/Globulin Ratio: 1.4 (ref 1.2–2.2)
Albumin: 3.8 g/dL (ref 3.8–4.9)
Alkaline Phosphatase: 145 IU/L — ABNORMAL HIGH (ref 39–117)
BUN/Creatinine Ratio: 20 (ref 10–24)
BUN: 44 mg/dL — ABNORMAL HIGH (ref 8–27)
Bilirubin Total: 0.3 mg/dL (ref 0.0–1.2)
CO2: 18 mmol/L — ABNORMAL LOW (ref 20–29)
Calcium: 9.4 mg/dL (ref 8.6–10.2)
Chloride: 95 mmol/L — ABNORMAL LOW (ref 96–106)
Creatinine, Ser: 2.17 mg/dL — ABNORMAL HIGH (ref 0.76–1.27)
GFR calc Af Amer: 37 mL/min/{1.73_m2} — ABNORMAL LOW (ref >59)
GFR calc non Af Amer: 32 mL/min/{1.73_m2} — ABNORMAL LOW (ref >59)
Globulin, Total: 2.7 g/dL (ref 1.5–4.5)
Glucose: 590 mg/dL (ref 65–99)
Potassium: 4.7 mmol/L (ref 3.5–5.2)
Sodium: 129 mmol/L — ABNORMAL LOW (ref 134–144)
Total Protein: 6.5 g/dL (ref 6.0–8.5)

## 2019-10-02 NOTE — Telephone Encounter (Signed)
Cardiology [place] Note  Received call from [place]. Blood glucose 590. Patient contacted by telephone and results were discussed. I informed him that his blood tests are very concerning, particularly his acidemia (bicarb 18) with severe hyperglycemia that suggests he may be trending toward DKA.   The patient states he did not take his insulin yesterday and frequently does not take his insulin. He states "very high blood sugars are typical for me." I explained to him again how concerning his current blood testing results are and explained that this could lead to a potentially fatal condition. I recommended that the patient come to the ED at this time, however he states he does not want to do this. He will take his insulin at home as scheduled and recheck his blood sugar in a few hours. If it is still severely elevated he will consider coming to the ED.   Patient's cardiologist cc'd on this message  Marcie Mowers, MD Cardiology Fellow, PGY-7

## 2019-10-02 NOTE — Telephone Encounter (Signed)
Long history of poorly controlled diabetes Can we call him beginning of the week and see if he would be open to referral to endocrinology High sugars can cause worsening cardiac disease, blockages in his legs Can lead to dehydration, kidney disease and put him in the hospital High sugar comes out in the urine making him dehydrated forcing him to urinate excessively

## 2019-10-03 NOTE — Procedures (Signed)
[place], [id number]  DATE OF SERVICE: September 22, 2019  Complete Pulmonary Function Testing Interpretation:  FINDINGS:  The forced vital capacity is reduced FEV1 is normal.  FEV1 FVC ratio is mildly decreased.  Postbronchodilator there is no significant change in FEV1 clinical improvement may occur in the absence of spirometric improvement.  Total lung capacity is mildly decreased.  Residual volume is decreased.  Residual volume total lung capacity ratio is decreased.  FRC is decreased.  DLCO is moderately decreased.  IMPRESSION:  This pulmonary function study is consistent with mild restrictive lung disease.  DLCO is moderately decreased.  Clinical correlation recommended  Allyne Gee, MD [facility] Pulmonary Critical Care Medicine Sleep Medicine

## 2019-10-04 NOTE — Telephone Encounter (Signed)
Spoke with patient and reviewed provider recommendations. He is well aware of his diabetic history and knows what to do for these elevated numbers. He has endocrinologist and has seen them recently. Patient states that he just moved to this area and all providers are new. So he is aware of his elevated numbers and they are working on them. Let him know that I would make provider aware and he verbalized understanding with no further questions at this time.

## 2019-10-04 NOTE — Telephone Encounter (Signed)
I spoke with the pt and he is willing to come to our office to discuss further management ( insulin pump)

## 2019-10-04 NOTE — Telephone Encounter (Signed)
Left voicemail message to call back  

## 2019-10-04 NOTE — Telephone Encounter (Signed)
Left voicemail message to call back regarding labs and recommendations.

## 2019-10-04 NOTE — Telephone Encounter (Signed)
Please check.

## 2019-10-06 ENCOUNTER — Encounter (INDEPENDENT_AMBULATORY_CARE_PROVIDER_SITE_OTHER): Payer: Self-pay | Admitting: Nurse Practitioner

## 2019-10-06 ENCOUNTER — Ambulatory Visit (INDEPENDENT_AMBULATORY_CARE_PROVIDER_SITE_OTHER): Payer: Managed Care, Other (non HMO) | Admitting: Nurse Practitioner

## 2019-10-06 ENCOUNTER — Ambulatory Visit (INDEPENDENT_AMBULATORY_CARE_PROVIDER_SITE_OTHER): Payer: 59

## 2019-10-06 ENCOUNTER — Other Ambulatory Visit: Payer: Self-pay

## 2019-10-06 VITALS — BP 148/73 | HR 66 | Resp 16 | Wt 218.0 lb

## 2019-10-06 DIAGNOSIS — I10 Essential (primary) hypertension: Secondary | ICD-10-CM

## 2019-10-06 DIAGNOSIS — I739 Peripheral vascular disease, unspecified: Secondary | ICD-10-CM | POA: Diagnosis not present

## 2019-10-06 DIAGNOSIS — I890 Lymphedema, not elsewhere classified: Secondary | ICD-10-CM

## 2019-10-06 DIAGNOSIS — E782 Mixed hyperlipidemia: Secondary | ICD-10-CM

## 2019-10-06 NOTE — Progress Notes (Signed)
Subjective:    Patient ID: [name] [name], male    DOB: 1961-01-18, 59 y.o.   MRN: [id number] Chief Complaint  Patient presents with  . Follow-up    ultrasound follow up    The patient returns today to follow-up with lower extremity weakness and some claudication-like symptoms.  He denies any rest pain like symptoms.  The patient does have history of coronary artery disease following a recent CABG.  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  The other velocities were mild in the right external iliac vein.  The left iliac also had mild stenosis.  Patient continues with the wound center for treatment.  The patient is also continue to wear his graduated compression stockings routinely for morning until night.   The patient returns to the office for followup evaluation regarding leg swelling.  The swelling has persisted and the pain associated with swelling continues.  The patient has developed a small blood blister near the ankle of his right lower extremity.  This is being currently treated by the wound center.  The patient also states elevation during the day and exercise is being done too.  Despite these conservative therapy efforts the patient continues to have notable lymphedema.  Today the patient's noninvasive studies show elevated velocities at the proximal common iliac arteries greater than 400.  However visualization of these arteries was limited due to air bowel gas and patient discomfort.  The largest aortic measurement was 2.6 cm.  The patient underwent bilateral ABIs which shows an ABI 0.96 on the right and 0.93 on the left.  The patient has biphasic tibial artery waveforms with good toe waveforms bilaterally.   Review of Systems  Cardiovascular: Positive for leg swelling.  Skin: Positive for wound.  Neurological: Positive for weakness.  All  other systems reviewed and are negative.      Objective:   Physical Exam  BP (!) 148/73 (BP Location: Left Arm)   Pulse 66   Resp 16   Wt 218 lb (98.9 kg)   BMI 27.99 kg/m   Past Medical History:  Diagnosis Date  . Arrhythmia   . Arthritis   . CAD (coronary artery disease)   . Cardiomyopathy, ischemic   . CHF (congestive heart failure) (HCC)    NYHA II-III  . Colon polyps   . Diabetes ([place])   . Dupre's syndrome   . GERD (gastroesophageal reflux disease)   . Hyperlipidemia   . Hypertension   . Migraine   . Neuropathy   . Peripheral vascular disease ([place])   . Retinopathy   . Sleep apnea   . Stage 3 chronic kidney disease   . Stomach ulcer     Social History   Socioeconomic History  . Marital status: Married    Spouse name: Not on file  . Number of children: Not on file  . Years of education: Not on file  . Highest education level: Not on file  Occupational History  . Not on file  Tobacco Use  . Smoking status: Former Smoker    Types: Cigarettes    Quit date: 05/18/2012    Years since quitting: 7.3  . Smokeless tobacco: Never Used  Substance and Sexual Activity  . Alcohol use: Yes    Comment: very rarely  . Drug use: Never  . Sexual activity: Not on file  Other Topics  Concern  . Not on file  Social History Narrative  . Not on file   Social Determinants of Health   Financial Resource Strain:   . Difficulty of Paying Living Expenses:   Food Insecurity:   . Worried About Charity fundraiser in the Last Year:   . Arboriculturist in the Last Year:   Transportation Needs:   . Film/video editor (Medical):   Marland Kitchen Lack of Transportation (Non-Medical):   Physical Activity:   . Days of Exercise per Week:   . Minutes of Exercise per Session:   Stress:   . Feeling of Stress :   Social Connections:   . Frequency of Communication with Friends and Family:   . Frequency of Social Gatherings with Friends and Family:   . Attends Religious Services:   .  Active Member of Clubs or Organizations:   . Attends Archivist Meetings:   Marland Kitchen Marital Status:   Intimate Partner Violence:   . Fear of Current or Ex-Partner:   . Emotionally Abused:   Marland Kitchen Physically Abused:   . Sexually Abused:     Past Surgical History:  Procedure Laterality Date  . bone graft surgery    . CARDIAC CATHETERIZATION  2013   S/p PCI   . CARDIAC CATHETERIZATION  2018   S/p CABG  . CARDIAC SURGERY    . CORONARY ARTERY BYPASS GRAFT  2018   (LIMA-LAD,VG-RCA,VG-OM1,VG-D1)  . EYE SURGERY    . PERIPHERAL VASCULAR CATHETERIZATION Right 10/28/2016   PTA/DEB Right SFA  . WRIST SURGERY      Family History  Problem Relation Age of Onset  . Diabetes Mother   . Heart disease Father     Allergies  Allergen Reactions  . Other Other (See Comments)    Pain  With joint stiffness    . Statins     Pain  With joint stiffness   . Atorvastatin Other (See Comments)    Muscle aches Muscle aches  . Pravastatin Other (See Comments)    Muscle aches Muscle aches  . Pregabalin Other (See Comments)    Generalized aches and pains Generalized aches and pains  . Rosuvastatin Other (See Comments)    Muscle Aches Other reaction(s): JOINT PAIN Other reaction(s): JOINT PAIN Muscle Aches        Assessment & Plan:   1. Lymphedema Recommend:  No surgery or intervention at this point in time.    I have reviewed my previous discussion with the patient regarding swelling and why it causes symptoms.  Patient will continue wearing graduated compression stockings class 1 (20-30 mmHg) on a daily basis. The patient will  beginning wearing the stockings first thing in the morning and removing them in the evening. The patient is instructed specifically not to sleep in the stockings.    In addition, behavioral modification including several periods of elevation of the lower extremities during the day will be continued.  This was reviewed with the patient during the initial  visit.  The patient will also continue routine exercise, especially walking on a daily basis as was discussed during the initial visit.    Despite conservative treatments of at least 4 weeks including graduated compression therapy class 1 and behavioral modification including exercise and elevation the patient  has not obtained adequate control of the lymphedema.  The patient still has stage 3 lymphedema and therefore, I believe that a lymph pump should be added to improve the control of the patient's  lymphedema.  Additionally, a lymph pump is warranted because it will reduce the risk of cellulitis and ulceration in the future.  Patient should follow-up in six months    2. PAD (peripheral artery disease) ([place]) Based upon the patient's noninvasive studies today as well as those in the past, it is likely that some of the patient's lower extremity pain and discomfort is related to some iliac level disease.  After discussion as to the options between conservative monitoring, CT scan or angiogram the patient opts to go with conservative monitoring at this time.  Patient is advised that if something should change he is welcome to contact her office for sooner follow-up visit otherwise we will have the patient in 6 months for noninvasive studies.  3. Hyperlipidemia, mixed Continue statin as ordered and reviewed, no changes at this time   4. Essential hypertension Continue antihypertensive medications as already ordered, these medications have been reviewed and there are no changes at this time.    Current Outpatient Medications on File Prior to Visit  Medication Sig Dispense Refill  . albuterol (VENTOLIN HFA) 108 (90 Base) MCG/ACT inhaler Inhale 2 puffs into the lungs every 6 (six) hours as needed for wheezing or shortness of breath. 18 g 3  . carvedilol (COREG) 12.5 MG tablet Take 12.5 mg by mouth 2 (two) times daily with a meal.    . Evolocumab (REPATHA SURECLICK) 979 MG/ML SOAJ Inject 140 mg  into the skin every 14 (fourteen) days.    Marland Kitchen ezetimibe (ZETIA) 10 MG tablet Take 10 mg by mouth daily.    . fluticasone furoate-vilanterol (BREO ELLIPTA) 100-25 MCG/INH AEPB Inhale 1 puff into the lungs daily. 1 each 3  . hydrALAZINE (APRESOLINE) 50 MG tablet Take 1 tablet (50 mg total) by mouth 3 (three) times daily. 90 tablet 6  . insulin degludec (TRESIBA FLEXTOUCH) 100 UNIT/ML SOPN FlexTouch Pen Inject 140 Units into the skin daily.    . insulin lispro (HUMALOG KWIKPEN) 100 UNIT/ML KwikPen Inject into the skin. 0-20 units per sliding scale    . pantoprazole (PROTONIX) 40 MG tablet Take 40 mg by mouth daily.    Marland Kitchen torsemide (DEMADEX) 20 MG tablet Take 2 tablets (40 mg total) by mouth 2 (two) times daily. Take 2 tablets (40 mg) daily with 2 tablets (40 mg) in the afternoon as needed 360 tablet 3   No current facility-administered medications on file prior to visit.    There are no Patient Instructions on file for this visit. No follow-ups on file.   Kris Hartmann, NP

## 2019-10-07 ENCOUNTER — Other Ambulatory Visit: Payer: Self-pay | Admitting: Cardiovascular Disease

## 2019-10-07 ENCOUNTER — Encounter: Payer: 59 | Admitting: Internal Medicine

## 2019-10-07 DIAGNOSIS — E11621 Type 2 diabetes mellitus with foot ulcer: Secondary | ICD-10-CM | POA: Diagnosis not present

## 2019-10-07 MED ORDER — REPATHA SURECLICK 140 MG/ML ~~LOC~~ SOAJ: 140.0000 mg | SUBCUTANEOUS | 1 refills | Status: DC

## 2019-10-07 NOTE — Telephone Encounter (Signed)
Please advise if ok to refill Historical Provider. 

## 2019-10-07 NOTE — Addendum Note (Signed)
Addended by: Anselm Pancoast on: 10/07/2019 04:22 PM   Modules accepted: Orders

## 2019-10-07 NOTE — Telephone Encounter (Signed)
Yes we can refill this.

## 2019-10-07 NOTE — Telephone Encounter (Signed)
Patient calling in to state this Rx for repatha needs to be sent to [facility] on [address] in [place] due to insurance issues

## 2019-10-07 NOTE — Telephone Encounter (Signed)
*  STAT* If patient is at the pharmacy, call can be transferred to refill team.   1. Which medications need to be refilled? (please list name of each medication and dose if known) Repatha 140 mg   2. Which pharmacy/location (including street and city if local pharmacy) is medication to be sent to? [facility] in [place] on [address]  3. Do they need a 30 day or 90 day supply? 1 month

## 2019-10-08 ENCOUNTER — Telehealth: Payer: Self-pay

## 2019-10-08 DIAGNOSIS — I25118 Atherosclerotic heart disease of native coronary artery with other forms of angina pectoris: Secondary | ICD-10-CM

## 2019-10-08 NOTE — Progress Notes (Signed)
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 tissue within the wound bed including Adherent Slough. 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 There was a Excisional Skin/Subcutaneous Tissue Debridement with a total area of 2.28 sq cm performed by Ricard Dillon, MD. With the following instrument(s): Curette to remove Viable and Non-Viable tissue/material. Material removed includes Subcutaneous  Tissue and Slough and after achieving pain control using Lidocaine. A time out was conducted at 10:20, prior to the start of the procedure. A Minimum amount of bleeding was controlled with Pressure. The procedure was tolerated well. Post Debridement Measurements: 1.2cm length x 1.9cm width x 0.2cm depth; 0.358cm^3 volume. Character of Wound/Ulcer Post Debridement is stable. Severity of Tissue Post Debridement is: Fat layer exposed. Post procedure Diagnosis Wound #3: Same as Pre-Procedure Wound #4 Pre-procedure diagnosis of Wound #4 is a Venous Leg Ulcer located on the Right,Proximal,Lateral Lower Leg .Severity of Tissue Pre Debridement is: Fat layer exposed. There was a Excisional Skin/Subcutaneous Tissue Debridement with a total area of 0.04 sq cm performed by Ricard Dillon, MD. With the following instrument(s): Curette to remove Viable and Non-Viable tissue/material. Material removed includes Subcutaneous Tissue and Slough and after achieving pain control using Lidocaine. A time out was conducted at 10:20, prior to the start of the procedure. A Minimum amount of bleeding was controlled with Pressure. The procedure was tolerated well. Post Debridement Measurements: 0.2cm length x 0.2cm width x 0.1cm depth; 0.003cm^3 volume. Character of Wound/Ulcer Post Debridement is stable. Severity of Tissue Post Debridement is: Fat layer exposed. [name] [name], [name] [name] ([id number]) Post procedure Diagnosis Wound #4: Same as Pre-Procedure Plan Wound Cleansing: Wound #3 Right,Distal,Lateral Lower Leg: Clean wound with Normal Saline. - In office Dial antibacterial soap, wash wounds, rinse and pat dry prior to dressing wounds Wound #4 Right,Proximal,Lateral Lower Leg: Clean wound with Normal Saline. -  In office Dial antibacterial soap, wash wounds, rinse and pat dry prior to dressing wounds Primary Wound Dressing: Wound #3 Right,Distal,Lateral Lower Leg: Iodoflex Wound #4 Right,Proximal,Lateral Lower  Leg: Iodoflex Secondary Dressing: Wound #3 Right,Distal,Lateral Lower Leg: [place] #4 Right,Proximal,Lateral Lower Leg: [place] Dressing Change Frequency: Wound #3 Right,Distal,Lateral Lower Leg: Change dressing every other day. Wound #4 Right,Proximal,Lateral Lower Leg: Change dressing every other day. Follow-up Appointments: Wound #3 Right,Distal,Lateral Lower Leg: Return Appointment in 1 week. Wound #4 Right,Proximal,Lateral Lower Leg: Return Appointment in 1 week. Off-Loading: Wound #3 Right,Distal,Lateral Lower Leg: Open toe surgical shoe with peg assist. Wound #4 Right,Proximal,Lateral Lower Leg: Open toe surgical shoe with peg assist. 1. Change the primary dressing to Iodoflex to see if we can get some surface debridement 2. He is going to change the dressing every second day 3. 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 Electronic Signature(s) Signed: 10/07/2019 5:28:47 PM By: Linton Ham MD Entered By: Linton Ham on 10/07/2019 10:36:39 [name] [name] ([id number]) -------------------------------------------------------------------------------- SuperBill Details Patient Name: [name] [name] Date of Service: 10/07/2019 Medical Record Number: [id number] Patient Account Number: [id number] Date of Birth/Sex: [birth year] (58 y.o. M) Treating RN: [name] Primary Care Provider: [name] Other Clinician: Referring Provider: [name] Treating Provider/Extender: Tito Dine in Treatment: 11 Diagnosis Coding ICD-10 Codes Code Description E11.621 Type 2 diabetes mellitus with foot ulcer L97.512 Non-pressure chronic ulcer of other part of right foot with fat layer exposed I50.42 Chronic combined systolic (congestive) and diastolic (congestive) heart failure I10 Essential (primary) hypertension I25.10 Atherosclerotic heart disease of native coronary artery without angina pectoris N18.30 Chronic kidney disease,  stage 3 unspecified L84 Corns and callosities L97.811 Non-pressure chronic ulcer of other part of right lower leg limited to breakdown of skin Facility Procedures CPT4 Code: 22633354 Description: 56256 - DEB SUBQ TISSUE 20 SQ CM/< Modifier: Quantity: 1 CPT4 Code: Description: ICD-10 Diagnosis Description L97.811 Non-pressure chronic ulcer of other part of right lower leg limited to bre Modifier: akdown of skin Quantity: Physician Procedures CPT4 Code: 3893734 Description: 11042 - WC PHYS SUBQ TISS 20 SQ CM Modifier: Quantity: 1 CPT4 Code: Description: ICD-10 Diagnosis Description L97.811 Non-pressure chronic ulcer of other part of right lower leg limited to bre Modifier: akdown of skin Quantity: Electronic Signature(s) Signed: 10/07/2019 5:28:47 PM By: Linton Ham MD Entered By: Linton Ham on 10/07/2019 10:37:13

## 2019-10-08 NOTE — Telephone Encounter (Signed)
Wait for Determination Please wait for [employer] NCPDP 2017 to return a determination.  However, I do not think this is going to be approved due to the PA stating that the preferred drug is Praluent.

## 2019-10-08 NOTE — Progress Notes (Signed)
[name], [name] ([id number]) Visit Report for 10/07/2019 Arrival Information Details Patient Name: [name] [name] Date of Service: 10/07/2019 9:30 AM Medical Record Number: [id number] Patient Account Number: [id number] Date of Birth/Sex: 11-30-1960 (59 y.o. M) Treating RN: [name] Primary Care Kirstina Leinweber: [name] Other Clinician: Referring Amberlynn Tempesta: [name] Treating Jaydan Meidinger/Extender: Tito Dine in Treatment: 11 Visit Information History Since Last Visit Added or deleted any medications: No Patient Arrived: Cane Any new allergies or adverse reactions: No Arrival Time: 09:46 Had a fall or experienced change in No Accompanied By: self activities of daily living that may affect Transfer Assistance: None risk of falls: Patient Identification Verified: Yes Signs or symptoms of abuse/neglect since last visito No Secondary Verification Process Completed: Yes Hospitalized since last visit: No Implantable device outside of the clinic excluding No cellular tissue based products placed in the center since last visit: Has Dressing in Place as Prescribed: Yes Pain Present Now: No Electronic Signature(s) Signed: 10/07/2019 5:03:46 PM By: [name] Entered By: [name] on 10/07/2019 09:46:51 [name] [name] ([id number]) -------------------------------------------------------------------------------- Encounter Discharge Information Details Patient Name: [name] [name] Date of Service: 10/07/2019 9:30 AM Medical Record Number: [id number] Patient Account Number: [id number] Date of Birth/Sex: Nov 20, 1960 (58 y.o. M) Treating RN: [name] Primary Care Niquita Digioia: [name] Other Clinician: Referring Thora Scherman: [name] Treating [place] Vi/Extender: Tito Dine in Treatment: 11 Encounter Discharge Information Items Post Procedure Vitals Discharge Condition: Stable Temperature (F): 98.7 Ambulatory Status: Ambulatory Pulse (bpm):  62 Discharge Destination: Home Respiratory Rate (breaths/min): 16 Transportation: Private Auto Blood Pressure (mmHg): 135/67 Accompanied By: self Schedule Follow-up Appointment: Yes Clinical Summary of Care: Electronic Signature(s) Signed: 10/07/2019 11:19:24 AM By: [name] Entered By: [name] on 10/07/2019 10:27:08 [name] [name] ([id number]) -------------------------------------------------------------------------------- Lower Extremity Assessment Details Patient Name: [name] [name] Date of Service: 10/07/2019 9:30 AM Medical Record Number: [id number] Patient Account Number: [id number] Date of Birth/Sex: 20-Aug-1960 (58 y.o. M) Treating RN: [name] Primary Care Kimm Sider: [name] Other Clinician: Referring Laquinn Shippy: [name] Treating Leticia Mcdiarmid/Extender: Ricard Dillon Weeks in Treatment: 11 Edema Assessment Assessed: [Left: No] [Right: No] Edema: [Left: Ye] [Right: s] Vascular Assessment Pulses: Dorsalis Pedis Palpable: [Right:Yes] Electronic Signature(s) Signed: 10/07/2019 5:03:46 PM By: [name] Entered By: [name] on 10/07/2019 09:52:52 [name] [name] ([id number]) -------------------------------------------------------------------------------- Multi Wound Chart Details Patient Name: [name] [name] Date of Service: 10/07/2019 9:30 AM Medical Record Number: [id number] Patient Account Number: [id number] Date of Birth/Sex: 1960-12-02 (58 y.o. M) Treating RN: [name] Primary Care Dericka Ostenson: [name] Other Clinician: Referring Kambry Takacs: [name] Treating Krystena Reitter/Extender: Tito Dine in Treatment: 11 Vital Signs Height(in): 74 Pulse(bpm): 77 Weight(lbs): 221 Blood Pressure(mmHg): 135/67 Body Mass Index(BMI): 28 Temperature(F): 98.7 Respiratory Rate(breaths/min): 16 Photos: [N/A:N/A] Wound Location: Right, Distal, Lateral Lower Leg Right, Proximal, Lateral Lower Leg N/A Wounding Event: Gradually  Appeared Gradually Appeared N/A Primary Etiology: Venous Leg Ulcer Venous Leg Ulcer N/A Comorbid History: Sleep Apnea, Congestive Heart Sleep Apnea, Congestive Heart N/A Failure, Coronary Artery Disease, Failure, Coronary Artery Disease, Hypertension, Peripheral Venous Hypertension, Peripheral Venous Disease, Type II Diabetes, History Disease, Type II Diabetes, History of Burn, Osteoarthritis, Neuropathy of Burn, Osteoarthritis, Neuropathy Date Acquired: 08/31/2019 09/01/2019 N/A Weeks of Treatment: 5 5 N/A Wound Status: Open Open N/A Measurements L x W x D (cm) 1.2x1.9x0.2 0.2x0.2x0.1 N/A Area (cm) : 1.791 0.031 N/A Volume (cm) : 0.358 0.003 N/A % Reduction in Area: 45.70% 95.50% N/A % Reduction in Volume: -8.50% 95.70% N/A Classification: Full Thickness Without Exposed Full Thickness Without Exposed  N/A Support Structures Support Structures Exudate Amount: Medium Medium N/A Exudate Type: Serous Serous N/A Exudate Color: amber amber N/A Wound Margin: Flat and Intact Flat and Intact N/A Granulation Amount: Medium (34-66%) Small (1-33%) N/A Granulation Quality: Pink Pink N/A Necrotic Amount: Medium (34-66%) Large (67-100%) N/A Exposed Structures: Fat Layer (Subcutaneous Tissue) Fat Layer (Subcutaneous Tissue) N/A Exposed: Yes Exposed: Yes Fascia: No Fascia: No Tendon: No Tendon: No Muscle: No Muscle: No Joint: No Joint: No Bone: No Bone: No Epithelialization: None Small (1-33%) N/A Debridement: Debridement - Excisional Debridement - Excisional N/A Pre-procedure Verification/Time 10:20 10:20 N/A Out Taken: Pain Control: Lidocaine Lidocaine N/A Tissue Debrided: Subcutaneous, Slough Subcutaneous, Slough N/A Level: Skin/Subcutaneous Tissue Skin/Subcutaneous Tissue N/A Debridement Area (sq cm): 2.28 0.04 N/A Instrument: Curette Curette N/A Bleeding: Minimum Minimum N/A Hemostasis Achieved: Pressure Pressure N/A Jurewicz, Geovany ([id number]) Debridement Treatment Procedure was  tolerated well Procedure was tolerated well N/A Response: Post Debridement 1.2x1.9x0.2 0.2x0.2x0.1 N/A Measurements L x W x D (cm) Post Debridement Volume: 0.358 0.003 N/A (cm) Procedures Performed: Debridement Debridement N/A Treatment Notes Wound #3 (Right, Distal, Lateral Lower Leg) Notes iodoflex, BFD Wound #4 (Right, Proximal, Lateral Lower Leg) Notes iodoflex, BFD Electronic Signature(s) Signed: 10/07/2019 5:28:47 PM By: Linton Ham MD Entered By: Linton Ham on 10/07/2019 10:28:47 [name] [name] ([id number]) -------------------------------------------------------------------------------- Multi-Disciplinary Care Plan Details Patient Name: [name] [name] Date of Service: 10/07/2019 9:30 AM Medical Record Number: [id number] Patient Account Number: [id number] Date of Birth/Sex: [birth year] (59 y.o. M) Treating RN: [name] Primary Care Timica Marcom: [name] Other Clinician: Referring Sima Lindenberger: [name] Treating Rishik Tubby/Extender: Tito Dine in Treatment: 11 Active Inactive Orientation to the Wound Care Program Nursing Diagnoses: Knowledge deficit related to the wound healing center program Goals: Patient/caregiver will verbalize understanding of the [place] Program Date Initiated: 07/22/2019 Target Resolution Date: 08/06/2019 Goal Status: Active Interventions: Provide education on orientation to the wound center Notes: Wound/Skin Impairment Nursing Diagnoses: Impaired tissue integrity Goals: Ulcer/skin breakdown will have a volume reduction of 30% by week 4 Date Initiated: 07/22/2019 Target Resolution Date: 08/17/2019 Goal Status: Active Interventions: Assess ulceration(s) every visit Notes: Electronic Signature(s) Signed: 10/07/2019 11:19:24 AM By: [name] Entered By: [name] on 10/07/2019 10:23:25 [name] [name] ([id number]) -------------------------------------------------------------------------------- Pain  Assessment Details Patient Name: [name] [name] Date of Service: 10/07/2019 9:30 AM Medical Record Number: [id number] Patient Account Number: [id number] Date of Birth/Sex: December 20, 1960 (59 y.o. M) Treating RN: [name] Primary Care Aujanae Mccullum: [name] Other Clinician: Referring Cristina Ceniceros: [name] Treating Eban Weick/Extender: Tito Dine in Treatment: 11 Active Problems Location of Pain Severity and Description of Pain Patient Has Paino No Site Locations Pain Management and Medication Current Pain Management: Electronic Signature(s) Signed: 10/07/2019 5:03:46 PM By: [name] Entered By: [name] on 10/07/2019 09:46:59 [name] [name] ([id number]) -------------------------------------------------------------------------------- Patient/Caregiver Education Details Patient Name: [name] [name] Date of Service: 10/07/2019 9:30 AM Medical Record Number: [id number] Patient Account Number: [id number] Date of Birth/Gender: 1960/06/14 (58 y.o. M) Treating RN: [name] Primary Care Physician: [name] Other Clinician: Referring Physician: [name] Treating Physician/Extender: Tito Dine in Treatment: 11 Education Assessment Education Provided To: Patient Education Topics Provided Wound/Skin Impairment: Handouts: Caring for Your Ulcer Methods: Demonstration, Explain/Verbal Responses: State content correctly Electronic Signature(s) Signed: 10/07/2019 11:19:24 AM By: [name] Entered By: [name] on 10/07/2019 10:26:30 [name] [name] ([id number]) -------------------------------------------------------------------------------- Wound Assessment Details Patient Name: [name] [name] Date of Service: 10/07/2019 9:30 AM Medical Record Number: [id number] Patient Account Number: [id number] Date of Birth/Sex: 05/10/1961 (58  y.o. M) Treating RN: [name] Primary Care Niki Cosman: [name] Other Clinician: Referring  Tenia Goh: [name] Treating Etheleen Valtierra/Extender: Tito Dine in Treatment: 11 Wound Status Wound Number: 3 Primary Venous Leg Ulcer Etiology: Wound Location: Right, Distal, Lateral Lower Leg Wound Open Wounding Event: Gradually Appeared Status: Date Acquired: 08/31/2019 Comorbid Sleep Apnea, Congestive Heart Failure, Coronary Artery Weeks Of Treatment: 5 History: Disease, Hypertension, Peripheral Venous Disease, Type II Clustered Wound: No Diabetes, History of Burn, Osteoarthritis, Neuropathy Photos Wound Measurements Length: (cm) 1.2 Width: (cm) 1.9 Depth: (cm) 0.2 Area: (cm) 1.791 Volume: (cm) 0.358 % Reduction in Area: 45.7% % Reduction in Volume: -8.5% Epithelialization: None Tunneling: No Undermining: No Wound Description Classification: Full Thickness Without Exposed Support Structu Wound Margin: Flat and Intact Exudate Amount: Medium Exudate Type: Serous Exudate Color: amber res Foul Odor After Cleansing: No Slough/Fibrino Yes Wound Bed Granulation Amount: Medium (34-66%) Exposed Structure Granulation Quality: Pink Fascia Exposed: No Necrotic Amount: Medium (34-66%) Fat Layer (Subcutaneous Tissue) Exposed: Yes Necrotic Quality: Adherent Slough Tendon Exposed: No Muscle Exposed: No Joint Exposed: No Bone Exposed: No Treatment Notes Wound #3 (Right, Distal, Lateral Lower Leg) Notes iodoflex, BFD Electronic Signature(s) PHINEHAS, GROUNDS ([id number]) Signed: 10/07/2019 5:03:46 PM By: [name] Entered By: [name] on 10/07/2019 09:52:05 [name] [name] ([id number]) -------------------------------------------------------------------------------- Wound Assessment Details Patient Name: [name] [name] Date of Service: 10/07/2019 9:30 AM Medical Record Number: [id number] Patient Account Number: [id number] Date of Birth/Sex: 1960/11/06 (59 y.o. M) Treating RN: [name] Primary Care Jamesyn [place]: [name] Other  Clinician: Referring Monae Topping: [name] Treating Nohelia Valenza/Extender: Tito Dine in Treatment: 11 Wound Status Wound Number: 4 Primary Venous Leg Ulcer Etiology: Wound Location: Right, Proximal, Lateral Lower Leg Wound Open Wounding Event: Gradually Appeared Status: Date Acquired: 09/01/2019 Comorbid Sleep Apnea, Congestive Heart Failure, Coronary Artery Weeks Of Treatment: 5 History: Disease, Hypertension, Peripheral Venous Disease, Type II Clustered Wound: No Diabetes, History of Burn, Osteoarthritis, Neuropathy Photos Wound Measurements Length: (cm) 0.2 Width: (cm) 0.2 Depth: (cm) 0.1 Area: (cm) 0.031 Volume: (cm) 0.003 % Reduction in Area: 95.5% % Reduction in Volume: 95.7% Epithelialization: Small (1-33%) Tunneling: No Undermining: No Wound Description Classification: Full Thickness Without Exposed Support Structu Wound Margin: Flat and Intact Exudate Amount: Medium Exudate Type: Serous Exudate Color: amber res Foul Odor After Cleansing: No Slough/Fibrino Yes Wound Bed Granulation Amount: Small (1-33%) Exposed Structure Granulation Quality: Pink Fascia Exposed: No Necrotic Amount: Large (67-100%) Fat Layer (Subcutaneous Tissue) Exposed: Yes Necrotic Quality: Adherent Slough Tendon Exposed: No Muscle Exposed: No Joint Exposed: No Bone Exposed: No Treatment Notes Wound #4 (Right, Proximal, Lateral Lower Leg) Notes iodoflex, BFD Electronic Signature(s) ELHADJI, PECORE ([id number]) Signed: 10/07/2019 5:03:46 PM By: [name] Entered By: [name] on 10/07/2019 09:52:41 [name] [name] ([id number]) -------------------------------------------------------------------------------- Vitals Details Patient Name: [name] [name] Date of Service: 10/07/2019 9:30 AM Medical Record Number: [id number] Patient Account Number: [id number] Date of Birth/Sex: July 27, 1960 (59 y.o. M) Treating RN: [name] Primary Care Janis Cuffe: [name]  Other Clinician: Referring [name] [name]: [name] Treating Jaeshawn Silvio/Extender: Tito Dine in Treatment: 11 Vital Signs Time Taken: 09:48 Temperature (F): 98.7 Height (in): 74 Pulse (bpm): 62 Weight (lbs): 221 Respiratory Rate (breaths/min): 16 Body Mass Index (BMI): 28.4 Blood Pressure (mmHg): 135/67 Reference Range: 80 - 120 mg / dl Electronic Signature(s) Signed: 10/07/2019 5:03:46 PM By: [name] Entered By: [name] on 10/07/2019 09:48:27

## 2019-10-08 NOTE — Telephone Encounter (Signed)
[name] ([facility]) Rx #: [id number] Repatha SureClick 140MG /ML auto-injectors  Wait for Questions [employer] NCPDP 2017 typically responds with questions in less than 15 minutes, but may take up to 24 hours.

## 2019-10-11 NOTE — Telephone Encounter (Signed)
Incoming fax from [facility].   Repatha was denied. Praluent is preferred.   Denial Given to [facility], RN.

## 2019-10-12 ENCOUNTER — Encounter (INDEPENDENT_AMBULATORY_CARE_PROVIDER_SITE_OTHER): Payer: Self-pay

## 2019-10-12 NOTE — Telephone Encounter (Signed)
Current plan approved criteria does not allow coverage of Repatha unless the patient has had a bad side effect with Praluent. Patient has not tried/failed this drug therefore patient's insurance plan is requiring Repatha to be changed to Praluent which is the plan's approved PCSK9 inhibitor.

## 2019-10-13 ENCOUNTER — Other Ambulatory Visit: Payer: Self-pay | Admitting: Internal Medicine

## 2019-10-13 DIAGNOSIS — J986 Disorders of diaphragm: Secondary | ICD-10-CM

## 2019-10-13 MED ORDER — PRALUENT 75 MG/ML ~~LOC~~ SOAJ: 75.0000 mg | SUBCUTANEOUS | 11 refills | Status: DC

## 2019-10-13 NOTE — Telephone Encounter (Signed)
Would start on Praluent 75 first Every 2 weeks with lipid panel in 3 months time

## 2019-10-13 NOTE — Telephone Encounter (Signed)
Spoke with patient and reviewed that Repatha was not approved and that Praluent 75 mg injection every 2 weeks with repeat labs in 3 months. He verbalized understanding and was agreeable with this plan. Advised that I would have scheduling call to set up appointment for 3 months Lipid panel after he starts the [place].

## 2019-10-13 NOTE — Addendum Note (Signed)
Addended by: Valora Corporal on: 10/13/2019 04:52 PM   Modules accepted: Orders

## 2019-10-14 ENCOUNTER — Encounter: Payer: 59 | Admitting: Internal Medicine

## 2019-10-14 ENCOUNTER — Encounter (INDEPENDENT_AMBULATORY_CARE_PROVIDER_SITE_OTHER): Payer: Self-pay

## 2019-10-14 ENCOUNTER — Other Ambulatory Visit: Payer: Self-pay

## 2019-10-14 DIAGNOSIS — E11621 Type 2 diabetes mellitus with foot ulcer: Secondary | ICD-10-CM | POA: Diagnosis not present

## 2019-10-15 NOTE — Progress Notes (Signed)
[name] [name], [name] [name] ([id number]) Visit Report for 10/14/2019 Arrival Information Details Patient Name: [name] [name], [name] [name] Date of Service: 10/14/2019 9:30 AM Medical Record Number: [id number] Patient Account Number: [id number] Date of Birth/Sex: May 09, 1961 (59 y.o. M) Treating RN: [name] Primary Care Argil Mahl: [name] Other Clinician: Referring Jacari Kirsten: [name] Treating Zienna Ahlin/Extender: Tito Dine in Treatment: 12 Visit Information History Since Last Visit Added or deleted any medications: No Patient Arrived: Cane Any new allergies or adverse reactions: No Arrival Time: 09:59 Had a fall or experienced change in No Accompanied By: self activities of daily living that may affect Transfer Assistance: None risk of falls: Patient Identification Verified: Yes Signs or symptoms of abuse/neglect since last visito No Secondary Verification Process Completed: Yes Hospitalized since last visit: No Implantable device outside of the clinic excluding No cellular tissue based products placed in the center since last visit: Has Dressing in Place as Prescribed: Yes Pain Present Now: No Electronic Signature(s) Signed: 10/14/2019 5:37:23 PM By: [name] Entered By: [name] on 10/14/2019 10:00:10 [name] [name] ([id number]) -------------------------------------------------------------------------------- Encounter Discharge Information Details Patient Name: [name] [name] Date of Service: 10/14/2019 9:30 AM Medical Record Number: [id number] Patient Account Number: [id number] Date of Birth/Sex: 1960-10-12 (58 y.o. M) Treating RN: [name] Primary Care Deara Bober: [name] Other Clinician: Referring Trystin Hargrove: [name] Treating Shrika Milos/Extender: Tito Dine in Treatment: 12 Encounter Discharge Information Items Post Procedure Vitals Discharge Condition: Stable Temperature (F): 98.1 Ambulatory Status: Ambulatory Pulse (bpm):  63 Discharge Destination: Home Respiratory Rate (breaths/min): 16 Transportation: Private Auto Blood Pressure (mmHg): 144/64 Accompanied By: self Schedule Follow-up Appointment: Yes Clinical Summary of Care: Electronic Signature(s) Signed: 10/14/2019 5:18:09 PM By: [name] Entered By: [name] on 10/14/2019 10:48:07 [name] [name] ([id number]) -------------------------------------------------------------------------------- Lower Extremity Assessment Details Patient Name: [name] [name] Date of Service: 10/14/2019 9:30 AM Medical Record Number: [id number] Patient Account Number: [id number] Date of Birth/Sex: [birth year] (58 y.o. M) Treating RN: [name] Primary Care Kymora Sciara: [name] Other Clinician: Referring Jaqlyn Gruenhagen: [name] Treating Anah Billard/Extender: Ricard Dillon Weeks in Treatment: 12 Edema Assessment Assessed: [Left: No] [Right: No] Edema: [Left: Ye] [Right: s] Vascular Assessment Pulses: Dorsalis Pedis Palpable: [Right:Yes] Electronic Signature(s) Signed: 10/14/2019 5:37:23 PM By: [name] Entered By: [name] on 10/14/2019 10:10:02 [name] [name] ([id number]) -------------------------------------------------------------------------------- Multi Wound Chart Details Patient Name: [name] [name] Date of Service: 10/14/2019 9:30 AM Medical Record Number: [id number] Patient Account Number: [id number] Date of Birth/Sex: 28-Feb-1961 (58 y.o. M) Treating RN: [name] Primary Care Evanee Lubrano: [name] Other Clinician: Referring Sakshi Sermons: [name] Treating Laurey Salser/Extender: Tito Dine in Treatment: 12 Vital Signs Height(in): 74 Pulse(bpm): 42 Weight(lbs): 221 Blood Pressure(mmHg): 144/64 Body Mass Index(BMI): 28 Temperature(F): 98.1 Respiratory Rate(breaths/min): 16 Photos: [N/A:N/A] Wound Location: Right, Distal, Lateral Lower Leg Right, Proximal, Lateral Lower Leg N/A Wounding Event: Gradually  Appeared Gradually Appeared N/A Primary Etiology: Venous Leg Ulcer Venous Leg Ulcer N/A Comorbid History: Sleep Apnea, Congestive Heart Sleep Apnea, Congestive Heart N/A Failure, Coronary Artery Disease, Failure, Coronary Artery Disease, Hypertension, Peripheral Venous Hypertension, Peripheral Venous Disease, Type II Diabetes, History Disease, Type II Diabetes, History of Burn, Osteoarthritis, Neuropathy of Burn, Osteoarthritis, Neuropathy Date Acquired: 08/31/2019 09/01/2019 N/A Weeks of Treatment: 6 6 N/A Wound Status: Open Open N/A Measurements L x W x D (cm) 1.2x1.7x0.4 0.2x0.2x0.1 N/A Area (cm) : 1.602 0.031 N/A Volume (cm) : 0.641 0.003 N/A % Reduction in Area: 51.40% 95.50% N/A % Reduction in Volume: -94.20% 95.70% N/A Classification: Full Thickness Without Exposed Full Thickness Without Exposed  N/A Support Structures Support Structures Exudate Amount: Medium Medium N/A Exudate Type: Serous Serous N/A Exudate Color: amber amber N/A Wound Margin: Flat and Intact Flat and Intact N/A Granulation Amount: Medium (34-66%) Large (67-100%) N/A Granulation Quality: Pink Pink N/A Necrotic Amount: Medium (34-66%) None Present (0%) N/A Exposed Structures: Fat Layer (Subcutaneous Tissue) Fat Layer (Subcutaneous Tissue) N/A Exposed: Yes Exposed: Yes Fascia: No Fascia: No Tendon: No Tendon: No Muscle: No Muscle: No Joint: No Joint: No Bone: No Bone: No Epithelialization: None Medium (34-66%) N/A Debridement: Debridement - Excisional N/A N/A Pre-procedure Verification/Time 10:43 N/A N/A Out Taken: Tissue Debrided: Subcutaneous, Slough N/A N/A Level: Skin/Subcutaneous Tissue N/A N/A Debridement Area (sq cm): 2.04 N/A N/A Instrument: Curette N/A N/A Bleeding: Minimum N/A N/A Hemostasis Achieved: Pressure N/A N/A Procedure was tolerated well N/A N/A ADIS, STURGILL ([id number]) Debridement Treatment Response: Post Debridement 1.2x1.7x0.4 N/A N/A Measurements L x W x D (cm) Post  Debridement Volume: 0.641 N/A N/A (cm) Procedures Performed: Debridement N/A N/A Treatment Notes Wound #3 (Right, Distal, Lateral Lower Leg) Notes iodoflex, BFD Wound #4 (Right, Proximal, Lateral Lower Leg) Notes iodoflex, BFD Electronic Signature(s) Signed: 10/14/2019 5:46:06 PM By: Linton Ham MD Entered By: Linton Ham on 10/14/2019 10:47:50 [name] [name] ([id number]) -------------------------------------------------------------------------------- Multi-Disciplinary Care Plan Details Patient Name: [name] [name] Date of Service: 10/14/2019 9:30 AM Medical Record Number: [id number] Patient Account Number: [id number] Date of Birth/Sex: April 07, 1961 (58 y.o. M) Treating RN: [name] Primary Care Mykal Kirchman: [name] Other Clinician: Referring Jamall Strohmeier: [name] Treating Mico Spark/Extender: Tito Dine in Treatment: 12 Active Inactive Orientation to the Wound Care Program Nursing Diagnoses: Knowledge deficit related to the wound healing center program Goals: Patient/caregiver will verbalize understanding of the [place] Program Date Initiated: 07/22/2019 Target Resolution Date: 08/06/2019 Goal Status: Active Interventions: Provide education on orientation to the wound center Notes: Wound/Skin Impairment Nursing Diagnoses: Impaired tissue integrity Goals: Ulcer/skin breakdown will have a volume reduction of 30% by week 4 Date Initiated: 07/22/2019 Target Resolution Date: 08/17/2019 Goal Status: Active Interventions: Assess ulceration(s) every visit Notes: Electronic Signature(s) Signed: 10/14/2019 5:18:09 PM By: [name] Entered By: [name] on 10/14/2019 10:43:02 [name] [name] ([id number]) -------------------------------------------------------------------------------- Pain Assessment Details Patient Name: [name] [name] Date of Service: 10/14/2019 9:30 AM Medical Record Number: [id number] Patient Account Number:  [id number] Date of Birth/Sex: March 05, 1961 (59 y.o. M) Treating RN: [name] Primary Care Brysten Reister: [name] Other Clinician: Referring Dashae Wilcher: [name] Treating Jaylea Plourde/Extender: Tito Dine in Treatment: 12 Active Problems Location of Pain Severity and Description of Pain Patient Has Paino No Site Locations Pain Management and Medication Current Pain Management: Electronic Signature(s) Signed: 10/14/2019 5:37:23 PM By: [name] Entered By: [name] on 10/14/2019 10:03:49 [name] [name] ([id number]) -------------------------------------------------------------------------------- Patient/Caregiver Education Details Patient Name: [name] [name] Date of Service: 10/14/2019 9:30 AM Medical Record Number: [id number] Patient Account Number: [id number] Date of Birth/Gender: [birth year] (58 y.o. M) Treating RN: [name] Primary Care Physician: [name] Other Clinician: Referring Physician: [name] Treating Physician/Extender: Tito Dine in Treatment: 12 Education Assessment Education Provided To: Patient Education Topics Provided Wound/Skin Impairment: Handouts: Caring for Your Ulcer Methods: Demonstration, Explain/Verbal Responses: State content correctly Electronic Signature(s) Signed: 10/14/2019 5:18:09 PM By: [name] Entered By: [name] on 10/14/2019 10:45:14 [name] [name] ([id number]) -------------------------------------------------------------------------------- Wound Assessment Details Patient Name: [name] [name] Date of Service: 10/14/2019 9:30 AM Medical Record Number: [id number] Patient Account Number: [id number] Date of Birth/Sex: 11-20-1960 (58 y.o. M) Treating RN: [name] Primary Care Annajulia Lewing: [name]  Other Clinician: Referring Olisa Quesnel: [name] Treating Mort Smelser/Extender: Tito Dine in Treatment: 12 Wound Status Wound Number: 3 Primary Venous Leg  Ulcer Etiology: Wound Location: Right, Distal, Lateral Lower Leg Wound Open Wounding Event: Gradually Appeared Status: Date Acquired: 08/31/2019 Comorbid Sleep Apnea, Congestive Heart Failure, Coronary Artery Weeks Of Treatment: 6 History: Disease, Hypertension, Peripheral Venous Disease, Type II Clustered Wound: No Diabetes, History of Burn, Osteoarthritis, Neuropathy Photos Wound Measurements Length: (cm) 1.2 Width: (cm) 1.7 Depth: (cm) 0.4 Area: (cm) 1.602 Volume: (cm) 0.641 % Reduction in Area: 51.4% % Reduction in Volume: -94.2% Epithelialization: None Tunneling: No Undermining: No Wound Description Classification: Full Thickness Without Exposed Support Structu Wound Margin: Flat and Intact Exudate Amount: Medium Exudate Type: Serous Exudate Color: amber res Foul Odor After Cleansing: No Slough/Fibrino Yes Wound Bed Granulation Amount: Medium (34-66%) Exposed Structure Granulation Quality: Pink Fascia Exposed: No Necrotic Amount: Medium (34-66%) Fat Layer (Subcutaneous Tissue) Exposed: Yes Necrotic Quality: Adherent Slough Tendon Exposed: No Muscle Exposed: No Joint Exposed: No Bone Exposed: No Treatment Notes Wound #3 (Right, Distal, Lateral Lower Leg) Notes iodoflex, BFD Electronic Signature(s) CHRISTOHPER, DUBE ([id number]) Signed: 10/14/2019 5:37:23 PM By: [name] Entered By: [name] on 10/14/2019 10:08:27 [name] [name] ([id number]) -------------------------------------------------------------------------------- Wound Assessment Details Patient Name: [name] [name] Date of Service: 10/14/2019 9:30 AM Medical Record Number: [id number] Patient Account Number: [id number] Date of Birth/Sex: 02-20-1961 (59 y.o. M) Treating RN: [name] Primary Care Rendon Howell: [name] Other Clinician: Referring Sharrod Achille: [name] Treating Almus Woodham/Extender: Tito Dine in Treatment: 12 Wound Status Wound Number: 4 Primary Venous  Leg Ulcer Etiology: Wound Location: Right, Proximal, Lateral Lower Leg Wound Open Wounding Event: Gradually Appeared Status: Date Acquired: 09/01/2019 Comorbid Sleep Apnea, Congestive Heart Failure, Coronary Artery Weeks Of Treatment: 6 History: Disease, Hypertension, Peripheral Venous Disease, Type II Clustered Wound: No Diabetes, History of Burn, Osteoarthritis, Neuropathy Photos Wound Measurements Length: (cm) 0.2 Width: (cm) 0.2 Depth: (cm) 0.1 Area: (cm) 0.031 Volume: (cm) 0.003 % Reduction in Area: 95.5% % Reduction in Volume: 95.7% Epithelialization: Medium (34-66%) Tunneling: No Undermining: No Wound Description Classification: Full Thickness Without Exposed Support Structu Wound Margin: Flat and Intact Exudate Amount: Medium Exudate Type: Serous Exudate Color: amber res Foul Odor After Cleansing: No Slough/Fibrino Yes Wound Bed Granulation Amount: Large (67-100%) Exposed Structure Granulation Quality: Pink Fascia Exposed: No Necrotic Amount: None Present (0%) Fat Layer (Subcutaneous Tissue) Exposed: Yes Tendon Exposed: No Muscle Exposed: No Joint Exposed: No Bone Exposed: No Treatment Notes Wound #4 (Right, Proximal, Lateral Lower Leg) Notes iodoflex, BFD Electronic Signature(s) ALBINO, BUFFORD ([id number]) Signed: 10/14/2019 5:37:23 PM By: [name] Entered By: [name] on 10/14/2019 10:09:34 [name] [name] ([id number]) -------------------------------------------------------------------------------- Vitals Details Patient Name: [name] [name] Date of Service: 10/14/2019 9:30 AM Medical Record Number: [id number] Patient Account Number: [id number] Date of Birth/Sex: [birth year] (59 y.o. M) Treating RN: [name] Primary Care Terelle Dobler: [name] Other Clinician: Referring Shantea Poulton: [name] Treating Kaelum Kissick/Extender: Tito Dine in Treatment: 12 Vital Signs Time Taken: 10:01 Temperature (F): 98.1 Height (in):  74 Pulse (bpm): 63 Weight (lbs): 221 Respiratory Rate (breaths/min): 16 Body Mass Index (BMI): 28.4 Blood Pressure (mmHg): 144/64 Reference Range: 80 - 120 mg / dl Electronic Signature(s) Signed: 10/14/2019 5:37:23 PM By: [name] Entered By: [name] on 10/14/2019 10:03:42

## 2019-10-15 NOTE — Progress Notes (Signed)
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 Right,Proximal,Lateral Lower Leg: Clean wound with Normal Saline. - In office Dial antibacterial soap, wash wounds, rinse and pat dry prior to dressing wounds Primary Wound Dressing: Wound #3 Right,Distal,Lateral Lower Leg: Iodoflex Wound #4 Right,Proximal,Lateral Lower Leg: Iodoflex Secondary Dressing: Wound #3 Right,Distal,Lateral Lower Leg: [place] Wound #4 Right,Proximal,Lateral Lower Leg: [place] Dressing Change Frequency: Wound #3 Right,Distal,Lateral Lower Leg: Change dressing every other day. Wound #4 Right,Proximal,Lateral Lower Leg: Change dressing every other day. Follow-up Appointments: Wound #3 Right,Distal,Lateral Lower Leg: Return Appointment in 1 week. Wound #4 Right,Proximal,Lateral Lower Leg: Return Appointment in 1 week. Off-Loading: Wound #3 Right,Distal,Lateral Lower Leg: Open toe surgical shoe with peg assist. Wound #4 Right,Proximal,Lateral Lower Leg: Open toe surgical shoe with peg assist. 1. I am going to continue with the Iodoflex 2. [place] through his Probation officer) Signed: 10/14/2019 5:46:06 PM By: Linton Ham MD Entered By: Linton Ham on 10/14/2019 10:55:30 [name] [name] ([id number]) -------------------------------------------------------------------------------- SuperBill Details Patient Name: [name] [name] Date of Service: 10/14/2019 Medical Record Number: [id number] Patient Account Number: [id number] Date of Birth/Sex: 1960-06-23 (58 y.o. 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.30 Chronic kidney disease, stage 3 unspecified L84 Corns and callosities L97.811 Non-pressure chronic ulcer of other part of right lower leg limited to breakdown of skin Facility Procedures CPT4 Code: [id number] Description: 11042 - DEB SUBQ TISSUE 20 SQ CM/< Modifier: Quantity: 1 CPT4 Code: Description: ICD-10 Diagnosis Description L97.512 Non-pressure chronic ulcer of other part of right foot with fat layer ex Modifier: posed Quantity: Physician Procedures CPT4 Code: [id number] Description: 11042 - WC PHYS SUBQ TISS 20 SQ CM Modifier: Quantity: 1 CPT4 Code: Description: ICD-10 Diagnosis Description  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

## 2019-10-18 ENCOUNTER — Ambulatory Visit
Admission: RE | Admit: 2019-10-18 | Discharge: 2019-10-18 | Disposition: A | Discharge status: Home / Self Care | Payer: 59 | Source: Ambulatory Visit | Attending: Internal Medicine | Admitting: Internal Medicine

## 2019-10-18 ENCOUNTER — Other Ambulatory Visit: Payer: Self-pay

## 2019-10-18 DIAGNOSIS — J986 Disorders of diaphragm: Secondary | ICD-10-CM | POA: Diagnosis present

## 2019-10-21 ENCOUNTER — Encounter: Payer: 59 | Admitting: Physician Assistant

## 2019-10-21 ENCOUNTER — Other Ambulatory Visit: Payer: Self-pay

## 2019-10-21 DIAGNOSIS — E11621 Type 2 diabetes mellitus with foot ulcer: Secondary | ICD-10-CM | POA: Diagnosis not present

## 2019-10-21 NOTE — Progress Notes (Addendum)
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 patient was not interested in any still not interested. We have been using collagen to all areas. 09/23/2019 upon evaluation today patient appears to be doing well with regard to his toe ulcer this is very close to complete resolution. Unfortunately in regard to his legs appears to show signs of cellulitis at this point. There is no signs of systemic infection which is good news but locally this is definitely infected. He states he noted this 1 Day after he came in last week for his wound care appointment. 10/01/2019 upon evaluation today patient appears to be doing excellent in regard to his wounds. In fact the toe ulcer is completely healed and the leg ulcers appear to both be doing better measuring smaller this is excellent news. Fortunately there is no signs of active infection at this time. 5/13; the patient has 2 small wounds on the right lateral lower leg. The more proximal one is very small but still has some depth. The distal is about the same in terms of size from last time. Still visible debris under illumination 5/20; 2 wounds on the right lateral lower leg. The more proximal one is a very tiny. The distal 1 still has depth and debris on the surface. [name] [name], [name] [name] ([id number]) debrided this last week and that the same today. We have been using Iodoflex to see if  we can get some debridement. 10/21/2019 upon evaluation today patient appears to be doing fairly well with regard to the overall appearance of his wound at this time. The main issue that I see is he does seem to have some issues right now with significant edema of the right lower extremity which I think is limiting his ability to be able to heal effectively. I think a compression wrap may be beneficial. We did get him lymphedema pumps unfortunately it seems that they may be causing some shortness of breath issues for him. That is something that may need to be addressed by his cardiologist but right now I would like to actually try a wrap first to see how he does with that and then depending on the results we may have him try the lymphedema pumps again. Nonetheless if he continues to have shortness of breath with utilization of lymphedema pumps he will not be able to continue this. Objective Constitutional Well-nourished and well-hydrated in no acute distress. Vitals Time Taken: 10:56 AM, Height: 74 in, Weight: 221 lbs, BMI: 28.4, Temperature: 98.3 F, Pulse: 64 bpm, Respiratory Rate: 16 breaths/min, Blood Pressure: 158/63 mmHg. Respiratory normal breathing without difficulty. Psychiatric this patient is able to make decisions and demonstrates good insight into disease process. Alert and Oriented x 3. pleasant and cooperative. General Notes: Upon inspection patient's wounds appear to be nice and clean there is  no needing my opinion for additional Iodoflex at this point I think that the biggest issue currently is probably can be keeping the edema down in order to allow this to heal. I think collagen may be a good option for the patient at this point. Integumentary (Hair, Skin) Wound #3 status is Open. Original cause of wound was Gradually Appeared. The wound is located on the Right,Distal,Lateral Lower Leg. The wound measures 1.1cm length x 1.5cm width x 0.3cm depth; 1.296cm^2 area and 0.389cm^3  volume. There is Fat Layer (Subcutaneous Tissue) Exposed exposed. There is no tunneling or undermining noted. There is a medium amount of serous drainage noted. The wound margin is flat and intact. There is medium (34-66%) pink granulation within the wound bed. There is a medium (34-66%) amount of necrotic tissue within the wound bed including Adherent Slough. General Notes: maceration around edges Wound #4 status is Open. Original cause of wound was Gradually Appeared. The wound is located on the Right,Proximal,Lateral Lower Leg. The wound measures 0.2cm length x 0.2cm width x 0.2cm depth; 0.031cm^2 area and 0.006cm^3 volume. There is Fat Layer (Subcutaneous Tissue) Exposed exposed. There is a medium amount of serous drainage noted. The wound margin is flat and intact. There is no granulation within the wound bed. There is a large (67-100%) amount of necrotic tissue within the wound bed including Eschar. 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 There was a Excisional Skin/Subcutaneous Tissue Debridement with a total area of 1.65 sq cm performed by [name], [name] E., PA-C. With the following instrument(s): Curette to remove Viable and Non-Viable tissue/material. Material removed includes Subcutaneous Tissue and Slough and after  achieving pain control using Lidocaine. No specimens were taken. A time out was conducted at 11:13, prior to the start of the procedure. A Minimum amount of bleeding was controlled with Pressure. The procedure was tolerated well. Post Debridement Measurements: 1.1cm length x 1.5cm width x 0.3cm depth; 0.389cm^3 volume. Character of Wound/Ulcer Post Debridement is stable. Severity of Tissue Post Debridement is: Fat layer exposed. Post procedure Diagnosis Wound #3: Same as Pre-Procedure Pre-procedure diagnosis of Wound #3 is a Venous Leg Ulcer located on the Right,Distal,Lateral Lower Leg . There was a Three Layer Compression Therapy Procedure with a pre-treatment ABI of 0.9 by Cornell Barman, RN. Post procedure Diagnosis Wound #3: Same as Pre-Procedure Wound #4 Pre-procedure diagnosis of Wound #4 is a Venous Leg Ulcer located on the Right,Proximal,Lateral Lower Leg .Severity of Tissue Pre Debridement is: Fat layer exposed. There was a Excisional Skin/Subcutaneous Tissue Debridement with a total area of 0.04 sq cm performed by [name], [name] E., PA-C. With the following instrument(s): Curette to remove Viable and Non-Viable tissue/material. Material removed includes Subcutaneous Tissue and Slough and after achieving pain control using Lidocaine. No specimens were taken. A time out was conducted at 11:13, prior to the start of the procedure. A Minimum amount of bleeding was controlled with Pressure. The procedure was tolerated well.  Post Debridement Measurements: 0.2cm length x 0.2cm width x 0.2cm depth; 0.006cm^3 volume. Character of Wound/Ulcer Post Debridement is stable. Severity of Tissue Post Debridement is: Fat layer exposed. Post procedure Diagnosis Wound #4: Same as Pre-Procedure Plan Wound Cleansing: Wound #3 Right,Distal,Lateral Lower Leg: Clean wound with Normal Saline. May shower with protection. Wound #4 Right,Proximal,Lateral Lower Leg: Clean wound with Normal Saline. May shower with  protection. Anesthetic (add to Medication List): Wound #3 Right,Distal,Lateral Lower Leg: Topical Lidocaine 4% cream applied to wound bed prior to debridement (In Clinic Only). Wound #4 Right,Proximal,Lateral Lower Leg: Topical Lidocaine 4% cream applied to wound bed prior to debridement (In Clinic Only). Skin Barriers/Peri-Wound Care: Wound #3 Right,Distal,Lateral Lower Leg: Triamcinolone Acetonide Ointment (TCA) Wound #4 Right,Proximal,Lateral Lower Leg: Triamcinolone Acetonide Ointment (TCA) Primary Wound Dressing: Wound #3 Right,Distal,Lateral Lower Leg: Silver Collagen Wound #4 Right,Proximal,Lateral Lower Leg: Silver Collagen Secondary Dressing: Wound #3 Right,Distal,Lateral Lower Leg: ABD pad Wound #4 Right,Proximal,Lateral Lower Leg: ABD pad Dressing Change Frequency: Wound #3 Right,Distal,Lateral Lower Leg: Change dressing every week Wound #4 Right,Proximal,Lateral Lower Leg: Change dressing every week Follow-up Appointments: Wound #3 Right,Distal,Lateral Lower Leg: Return Appointment in 1 week. Nurse Visit as needed Wound #4 Right,Proximal,Lateral Lower Leg: Return Appointment in 1 week. Nurse Visit as needed Edema Control: Wound #3 Right,Distal,Lateral Lower Leg: 3 Layer Compression System - Right Lower Extremity Wound #4 Right,Proximal,Lateral Lower Leg: 3 Layer Compression System - Right Lower Extremity [name] [name], [name] [name] ([id number]) 1. I would recommend currently that we going to switch to silver collagen at this point to help with the patient's healing as far as new epithelial and granulation tissue growth is concerned. 2. Also can recommend that we initiate a 3 layer compression wrap to see how he does. Obviously if he has any trouble with shortness of breath or otherwise he is to let us know as soon as possible take the wrap off as needed. 3. We will use of TCA to the leg due to his skin condition he is obviously prone to irritation. We will see patient back  for reevaluation in 1 week here in the clinic. If anything worsens or changes patient will contact our office for additional recommendations. Electronic Signature(s) Signed: 10/21/2019 12:38:00 PM By: Worthy Keeler PA-C Entered By: Worthy Keeler on 10/21/2019 12:38:00 [name] [name] ([id number]) -------------------------------------------------------------------------------- SuperBill Details Patient Name: [name] [name] Date of Service: 10/21/2019 Medical Record Number: [id number] Patient Account Number: [id number] Date of Birth/Sex: 12-09-1960 (58 y.o. M) Treating RN: [name] Primary Care Melvia Matousek: [name] Other Clinician: Referring Zailyn Thoennes: [name] Treating Sharisse Rantz/Extender: [name], [name]: 13 Diagnosis Coding ICD-10 Codes Code Description E11.621 Type 2 diabetes mellitus with foot ulcer L97.512 Non-pressure chronic ulcer of other part of right foot with fat layer exposed I50.42 Chronic combined systolic (congestive) and diastolic (congestive) heart failure I10 Essential (primary) hypertension I25.10 Atherosclerotic heart disease of native coronary artery without angina pectoris N18.30 Chronic kidney disease, stage 3 unspecified L84 Corns and callosities L97.811 Non-pressure chronic ulcer of other part of right lower leg limited to breakdown of skin Facility Procedures CPT4 Code: [id number] Description: 11042 - DEB SUBQ TISSUE 20 SQ CM/< Modifier: Quantity: 1 CPT4 Code: Description: ICD-10 Diagnosis Description L97.811 Non-pressure chronic ulcer of other part of right lower leg limited to bre Modifier: akdown of skin Quantity: Physician Procedures CPT4 Code: [id number] Description: 11042 - WC PHYS SUBQ TISS 20 SQ CM Modifier: Quantity: 1 CPT4 Code: Description: ICD-10 Diagnosis Description L97.811 Non-pressure chronic ulcer of other part of  right lower leg limited to bre Modifier: akdown of skin Quantity: Electronic  Signature(s) Signed: 10/21/2019 12:38:26 PM By: Worthy Keeler PA-C Entered By: Worthy Keeler on 10/21/2019 12:38:26

## 2019-10-21 NOTE — Progress Notes (Addendum)
CAYDENCE, ENCK ([id number]) Visit Report for 10/21/2019 Arrival Information Details Patient Name: [name] [name], [name] [name] Date of Service: 10/21/2019 11:00 AM Medical Record Number: [id number] Patient Account Number: [id number] Date of Birth/Sex: [birth year] (59 y.o. M) Treating RN: Cornell Barman Primary Care Aalyssa Elderkin: [name] Other Clinician: Referring Kourtni Stineman: [name] Treating Avy Barlett/Extender: [name], [name]: 42 Visit Information History Since Last Visit Added or deleted any medications: No Patient Arrived: Ambulatory Any new allergies or adverse reactions: No Arrival Time: 10:56 Had a fall or experienced change in No Accompanied By: self activities of daily living that may affect Transfer Assistance: None risk of falls: Patient Identification Verified: Yes Signs or symptoms of abuse/neglect since last visito No Secondary Verification Process Completed: Yes Hospitalized since last visit: No Implantable device outside of the clinic excluding No cellular tissue based products placed in the center since last visit: Has Dressing in Place as Prescribed: Yes Pain Present Now: No Electronic Signature(s) Signed: 10/22/2019 2:01:12 PM By: Gretta Cool, BSN, RN, CWS, Kim RN, BSN Entered By: Gretta Cool, BSN, RN, CWS, Kim on 10/21/2019 10:56:55 [name] [name] ([id number]) -------------------------------------------------------------------------------- Compression Therapy Details Patient Name: [name] [name] Date of Service: 10/21/2019 11:00 AM Medical Record Number: [id number] Patient Account Number: [id number] Date of Birth/Sex: 1960-09-13 (59 y.o. M) Treating RN: Cornell Barman Primary Care Kajuan Guyton: [name] Other Clinician: Referring Jamekia Gannett: [name] Treating Liahna Brickner/Extender: [name], [name]: 13 Compression Therapy Performed for Wound Assessment: Wound #3 Right,Distal,Lateral Lower Leg Performed By: Clinician Cornell Barman, RN Compression Type:  Three Layer Pre Treatment ABI: 0.9 Post Procedure Diagnosis Same as Pre-procedure Electronic Signature(s) Signed: 10/22/2019 2:01:12 PM By: Gretta Cool, BSN, RN, CWS, Kim RN, BSN Entered By: Gretta Cool, BSN, RN, CWS, Kim on 10/21/2019 11:15:15 [name] [name] ([id number]) -------------------------------------------------------------------------------- Encounter Discharge Information Details Patient Name: [name] [name] Date of Service: 10/21/2019 11:00 AM Medical Record Number: [id number] Patient Account Number: [id number] Date of Birth/Sex: Nov 12, 1960 (59 y.o. M) Treating RN: Cornell Barman Primary Care Aizza Santiago: [name] Other Clinician: Referring Brigham Cobbins: [name] Treating Antowan Samford/Extender: [name], [name]: 80 Encounter Discharge Information Items Post Procedure Vitals Discharge Condition: Stable Temperature (F): 98.3 Ambulatory Status: Ambulatory Pulse (bpm): 78 Discharge Destination: Home Respiratory Rate (breaths/min): 16 Transportation: Private Auto Unable to obtain vitals Reason: . Accompanied By: self Schedule Follow-up Appointment: Yes Clinical Summary of Care: Electronic Signature(s) Signed: 10/22/2019 2:01:12 PM By: Gretta Cool, BSN, RN, CWS, Kim RN, BSN Entered By: Gretta Cool, BSN, RN, CWS, Kim on 10/21/2019 11:30:59 [name] [name] ([id number]) -------------------------------------------------------------------------------- Lower Extremity Assessment Details Patient Name: [name] [name] Date of Service: 10/21/2019 11:00 AM Medical Record Number: [id number] Patient Account Number: [id number] Date of Birth/Sex: [birth year] (59 y.o. M) Treating RN: Cornell Barman Primary Care Yashas Camilli: [name] Other Clinician: Referring Roosvelt Churchwell: [name] Treating Zayyan Mullen/Extender: [name], [name]: 13 Edema Assessment Assessed: [Left: No] [Right: Yes] Edema: [Left: N] [Right: o] Vascular Assessment Pulses: Dorsalis Pedis Palpable:  [Right:Yes] Posterior Tibial Palpable: [Right:Yes] Electronic Signature(s) Signed: 10/22/2019 2:01:12 PM By: Gretta Cool, BSN, RN, CWS, Kim RN, BSN Entered By: Gretta Cool, BSN, RN, CWS, Kim on 10/21/2019 11:04:50 [name] [name] ([id number]) -------------------------------------------------------------------------------- Multi Wound Chart Details Patient Name: [name] [name] Date of Service: 10/21/2019 11:00 AM Medical Record Number: [id number] Patient Account Number: [id number] Date of Birth/Sex: 15-Jan-1961 (59 y.o. M) Treating RN: Cornell Barman Primary Care Lennard Capek: [name] Other Clinician: Referring Gaudencio Chesnut: [name] Treating Lagina Reader/Extender: [name], [name]: 13 Vital Signs Height(in): 74 Pulse(bpm): 64 Weight(lbs): 221 Blood Pressure(mmHg): 158/63 Body Mass Index(BMI):  28 Temperature(F): 98.3 Respiratory Rate(breaths/min): 16 Photos: [4:No Photos] [N/A:N/A] Wound Location: Right, Distal, Lateral Lower Leg Right, Proximal, Lateral Lower Leg N/A Wounding Event: Gradually Appeared Gradually Appeared N/A Primary Etiology: Venous Leg Ulcer Venous Leg Ulcer N/A Comorbid History: Sleep Apnea, Congestive Heart N/A N/A Failure, Coronary Artery Disease, Hypertension, Peripheral Venous Disease, Type II Diabetes, History of Burn, Osteoarthritis, Neuropathy Date Acquired: 08/31/2019 09/01/2019 N/A Weeks of Treatment: 7 7 N/A Wound Status: Open Healed - Epithelialized N/A Measurements L x W x D (cm) 1.1x1.5x0.3 0x0x0 N/A Area (cm) : 1.296 0 N/A Volume (cm) : 0.389 0 N/A % Reduction in Area: 60.70% 100.00% N/A % Reduction in Volume: -17.90% 100.00% N/A Classification: Full Thickness Without Exposed Full Thickness Without Exposed N/A Support Structures Support Structures Exudate Amount: Medium N/A N/A Exudate Type: Serous N/A N/A Exudate Color: amber N/A N/A Wound Margin: Flat and Intact N/A N/A Granulation Amount: Medium (34-66%) N/A N/A Granulation Quality: Pink  N/A N/A Necrotic Amount: Medium (34-66%) N/A N/A Exposed Structures: Fat Layer (Subcutaneous Tissue) N/A N/A Exposed: Yes Fascia: No Tendon: No Muscle: No Joint: No Bone: No Epithelialization: None N/A N/A Assessment Notes: maceration around edges N/A N/A Treatment Notes Electronic Signature(s) Signed: 10/22/2019 2:01:12 PM By: Gretta Cool, BSN, RN, CWS, Kim RN, BSN Entered By: Gretta Cool, BSN, RN, CWS, Kim on 10/21/2019 11:08:38 [name] [name] ([id number]) [name] [name], [name] [name] ([id number]) -------------------------------------------------------------------------------- Multi-Disciplinary Care Plan Details Patient Name: [name] [name] Date of Service: 10/21/2019 11:00 AM Medical Record Number: [id number] Patient Account Number: [id number] Date of Birth/Sex: 1960/08/24 (58 y.o. M) Treating RN: Cornell Barman Primary Care Geneal Huebert: [name] Other Clinician: Referring Antionetta Ator: [name] Treating Therman Hughlett/Extender: [name], [name]: 81 Active Inactive Orientation to the Wound Care Program Nursing Diagnoses: Knowledge deficit related to the wound healing center program Goals: Patient/caregiver will verbalize understanding of the [place] Program Date Initiated: 07/22/2019 Target Resolution Date: 08/06/2019 Goal Status: Active Interventions: Provide education on orientation to the wound center Notes: Wound/Skin Impairment Nursing Diagnoses: Impaired tissue integrity Goals: Ulcer/skin breakdown will have a volume reduction of 30% by week 4 Date Initiated: 07/22/2019 Target Resolution Date: 08/17/2019 Goal Status: Active Interventions: Assess ulceration(s) every visit Notes: Electronic Signature(s) Signed: 10/22/2019 2:01:12 PM By: Gretta Cool, BSN, RN, CWS, Kim RN, BSN Entered By: Gretta Cool, BSN, RN, CWS, Kim on 10/21/2019 11:08:09 [name] [name] ([id number]) -------------------------------------------------------------------------------- Pain Assessment  Details Patient Name: [name] [name] Date of Service: 10/21/2019 11:00 AM Medical Record Number: [id number] Patient Account Number: [id number] Date of Birth/Sex: June 03, 1960 (58 y.o. M) Treating RN: Cornell Barman Primary Care Jahmal Dunavant: [name] Other Clinician: Referring Kalkidan Caudell: [name] Treating Carena Stream/Extender: [name], [name]: 13 Active Problems Location of Pain Severity and Description of Pain Patient Has Paino No Site Locations Pain Management and Medication Current Pain Management: Notes Patient denies pain at this time. Electronic Signature(s) Signed: 10/22/2019 2:01:12 PM By: Gretta Cool, BSN, RN, CWS, Kim RN, BSN Entered By: Gretta Cool, BSN, RN, CWS, Kim on 10/21/2019 10:57:37 [name] [name] ([id number]) -------------------------------------------------------------------------------- Patient/Caregiver Education Details Patient Name: [name] [name] Date of Service: 10/21/2019 11:00 AM Medical Record Number: [id number] Patient Account Number: [id number] Date of Birth/Gender: [birth year] (58 y.o. M) Treating RN: Cornell Barman Primary Care Physician: [name] Other Clinician: Referring Physician: [name] Treating Physician/Extender: Sharalyn Ink in Treatment: 13 Education Assessment Education Provided To: Patient Education Topics Provided Venous: Handouts: Controlling Swelling with Multilayered Compression Wraps Methods: Demonstration, Explain/Verbal Responses: State content correctly Electronic Signature(s) Signed: 10/22/2019 2:01:12 PM By: Gretta Cool, BSN, RN, CWS,  Maudie Mercury RN, BSN Entered By: Gretta Cool, BSN, RN, CWS, Kim on 10/21/2019 11:28:55 [name] [name] ([id number]) -------------------------------------------------------------------------------- Wound Assessment Details Patient Name: [name] [name] Date of Service: 10/21/2019 11:00 AM Medical Record Number: [id number] Patient Account Number: [id number] Date of Birth/Sex: 1960-07-16 (58 y.o.  M) Treating RN: Cornell Barman Primary Care Jerre Diguglielmo: [name] Other Clinician: Referring Skye Plamondon: [name] Treating Toy Eisemann/Extender: [name], [name]: 13 Wound Status Wound Number: 3 Primary Venous Leg Ulcer Etiology: Wound Location: Right, Distal, Lateral Lower Leg Wound Open Wounding Event: Gradually Appeared Status: Date Acquired: 08/31/2019 Comorbid Sleep Apnea, Congestive Heart Failure, Coronary Artery Weeks Of Treatment: 7 History: Disease, Hypertension, Peripheral Venous Disease, Type II Clustered Wound: No Diabetes, History of Burn, Osteoarthritis, Neuropathy Photos Wound Measurements Length: (cm) 1.1 Width: (cm) 1.5 Depth: (cm) 0.3 Area: (cm) 1.296 Volume: (cm) 0.389 % Reduction in Area: 60.7% % Reduction in Volume: -17.9% Epithelialization: None Tunneling: No Undermining: No Wound Description Classification: Full Thickness Without Exposed Support Structu Wound Margin: Flat and Intact Exudate Amount: Medium Exudate Type: Serous Exudate Color: amber res Foul Odor After Cleansing: No Slough/Fibrino Yes Wound Bed Granulation Amount: Medium (34-66%) Exposed Structure Granulation Quality: Pink Fascia Exposed: No Necrotic Amount: Medium (34-66%) Fat Layer (Subcutaneous Tissue) Exposed: Yes Necrotic Quality: Adherent Slough Tendon Exposed: No Muscle Exposed: No Joint Exposed: No Bone Exposed: No Assessment Notes maceration around edges Treatment Notes Wound #3 (Right, Distal, Lateral Lower Leg) Notes [name] [name], [name] [name] ([id number]) Prisma Ag, TCA, ABD, 3layer right, unna to Engineer, production) Signed: 10/22/2019 2:01:12 PM By: Gretta Cool, BSN, RN, CWS, Kim RN, BSN Entered By: Gretta Cool, BSN, RN, CWS, Kim on 10/21/2019 11:04:33 [name] [name] ([id number]) -------------------------------------------------------------------------------- Wound Assessment Details Patient Name: [name] [name] Date of Service: 10/21/2019 11:00  AM Medical Record Number: [id number] Patient Account Number: [id number] Date of Birth/Sex: [birth year] (58 y.o. M) Treating RN: Cornell Barman Primary Care Ellise Kovack: [name] Other Clinician: Referring Lynzie Cliburn: [name] Treating Lilyannah Zuelke/Extender: [name], [name]: 13 Wound Status Wound Number: 4 Primary Venous Leg Ulcer Etiology: Wound Location: Right, Proximal, Lateral Lower Leg Wound Open Wounding Event: Gradually Appeared Status: Date Acquired: 09/01/2019 Comorbid Sleep Apnea, Congestive Heart Failure, Coronary Artery Weeks Of Treatment: 7 History: Disease, Hypertension, Peripheral Venous Disease, Type II Clustered Wound: No Diabetes, History of Burn, Osteoarthritis, Neuropathy Wound Measurements Length: (cm) 0.2 Width: (cm) 0.2 Depth: (cm) 0.2 Area: (cm) 0.031 Volume: (cm) 0.006 % Reduction in Area: 95.5% % Reduction in Volume: 91.3% Epithelialization: Medium (34-66%) Wound Description Classification: Full Thickness Without Exposed Support Structu Wound Margin: Flat and Intact Exudate Amount: Medium Exudate Type: Serous Exudate Color: amber res Foul Odor After Cleansing: No Slough/Fibrino Yes Wound Bed Granulation Amount: None Present (0%) Exposed Structure Necrotic Amount: Large (67-100%) Fascia Exposed: No Necrotic Quality: Eschar Fat Layer (Subcutaneous Tissue) Exposed: Yes Tendon Exposed: No Muscle Exposed: No Joint Exposed: No Bone Exposed: No Treatment Notes Wound #4 (Right, Proximal, Lateral Lower Leg) Notes Prisma Ag, TCA, ABD, 3layer right, unna to Engineer, production) Signed: 10/22/2019 2:01:12 PM By: Gretta Cool, BSN, RN, CWS, Kim RN, BSN Entered By: Gretta Cool, BSN, RN, CWS, Kim on 10/21/2019 11:09:58 [name] [name] ([id number]) -------------------------------------------------------------------------------- Vitals Details Patient Name: [name] [name] Date of Service: 10/21/2019 11:00 AM Medical Record Number:  [id number] Patient Account Number: [id number] Date of Birth/Sex: 1961/03/31 (58 y.o. M) Treating RN: Cornell Barman Primary Care Jeevan Kalla: [name] Other Clinician: Referring Kerstin Crusoe: [name] Treating Emerlyn Mehlhoff/Extender: [name], [name]: 13 Vital Signs Time Taken: 10:56 Temperature (F): 98.3  Height (in): 74 Pulse (bpm): 64 Weight (lbs): 221 Respiratory Rate (breaths/min): 16 Body Mass Index (BMI): 28.4 Blood Pressure (mmHg): 158/63 Reference Range: 80 - 120 mg / dl Electronic Signature(s) Signed: 10/22/2019 2:01:12 PM By: Gretta Cool, BSN, RN, CWS, Kim RN, BSN Entered By: Gretta Cool, BSN, RN, CWS, Kim on 10/21/2019 10:57:12

## 2019-10-27 LAB — SPECIMEN STATUS REPORT

## 2019-10-27 LAB — BASIC METABOLIC PANEL
African American eGFR: 83 mL/min/{1.73_m2} (ref >59)
BUN / Creatinine Ratio: 19 (ref 9–20)
BUN: 21 mg/dL (ref 6–24)
CO2: 21 mmol/L (ref 20–29)
Calcium: 9.5 mg/dL (ref 8.7–10.2)
Chloride: 98 mmol/L (ref 96–106)
Creatinine: 1.12 mg/dL (ref 0.76–1.27)
Glucose: 240 mg/dL — ABNORMAL HIGH (ref 65–99)
Potassium: 4.9 mmol/L (ref 3.5–5.2)
Sodium: 134 mmol/L (ref 134–144)
non-African American eGFR: 72 mL/min/{1.73_m2} (ref >59)

## 2019-10-28 ENCOUNTER — Ambulatory Visit: Payer: Managed Care, Other (non HMO) | Admitting: Adult Health

## 2019-10-28 ENCOUNTER — Encounter: Payer: Self-pay | Admitting: Adult Health

## 2019-10-28 ENCOUNTER — Other Ambulatory Visit: Payer: Self-pay

## 2019-10-28 VITALS — BP 148/63 | HR 61 | Temp 97.2°F | Resp 16 | Ht 74.0 in | Wt 237.2 lb

## 2019-10-28 DIAGNOSIS — L8992 Pressure ulcer of unspecified site, stage 2: Secondary | ICD-10-CM

## 2019-10-28 DIAGNOSIS — R0602 Shortness of breath: Secondary | ICD-10-CM | POA: Diagnosis not present

## 2019-10-28 DIAGNOSIS — I10 Essential (primary) hypertension: Secondary | ICD-10-CM | POA: Diagnosis not present

## 2019-10-28 DIAGNOSIS — Z794 Long term (current) use of insulin: Secondary | ICD-10-CM

## 2019-10-28 DIAGNOSIS — E1140 Type 2 diabetes mellitus with diabetic neuropathy, unspecified: Secondary | ICD-10-CM | POA: Diagnosis not present

## 2019-10-28 NOTE — Progress Notes (Signed)
[facility] [place], [place] [address]  Internal MEDICINE  Office Visit Note  Patient Name: [name] [name]  [id number]  [id number]  Date of Service: 11/01/2019  Chief Complaint  Patient presents with  . Acute Visit    leg swelling on both legs, gasping for breath, inhalers not working, weight gain, bp goes up and down,O2 levels drops     HPI Pt is here for a sick visit. He is seen today for multiple medical problems.  He does not seem to be getting better.  He has multiple complaints today.  His biggest complaint is that his legs are continuing to swell. He also mentions that he periodically gasps for air.  He does not feel like his inhalers are working.  He has gained weight.  He is reporting that his oxygen saturation drops at home, and his blood pressure is up and down.  Denies Chest pain, Shortness of breath, palpitations, headache, or blurred vision.   His blood sugars remain elevated, he sees endocrinology.     Current Medication:  Outpatient Encounter Medications as of 10/28/2019  Medication Sig  . albuterol (VENTOLIN HFA) 108 (90 Base) MCG/ACT inhaler Inhale 2 puffs into the lungs every 6 (six) hours as needed for wheezing or shortness of breath.  . Alirocumab (PRALUENT) 75 MG/ML SOAJ Inject 75 mg into the skin every 14 (fourteen) days.  . carvedilol (COREG) 12.5 MG tablet Take 12.5 mg by mouth 2 (two) times daily with a meal.  . ezetimibe (ZETIA) 10 MG tablet Take 10 mg by mouth daily.  . fluticasone furoate-vilanterol (BREO ELLIPTA) 100-25 MCG/INH AEPB Inhale 1 puff into the lungs daily.  . hydrALAZINE (APRESOLINE) 50 MG tablet Take 1 tablet (50 mg total) by mouth 3 (three) times daily.  . insulin degludec (TRESIBA FLEXTOUCH) 100 UNIT/ML SOPN FlexTouch Pen Inject 140 Units into the skin daily.  . insulin lispro (HUMALOG KWIKPEN) 100 UNIT/ML KwikPen Inject into the skin. 0-20 units per sliding scale  . pantoprazole (PROTONIX) 40 MG tablet Take 40  mg by mouth daily.  Marland Kitchen torsemide (DEMADEX) 20 MG tablet Take 2 tablets (40 mg total) by mouth 2 (two) times daily. Take 2 tablets (40 mg) daily with 2 tablets (40 mg) in the afternoon as needed   No facility-administered encounter medications on file as of 10/28/2019.      Medical History: Past Medical History:  Diagnosis Date  . Arrhythmia   . Arthritis   . CAD (coronary artery disease)   . Cardiomyopathy, ischemic   . CHF (congestive heart failure) (HCC)    NYHA II-III  . Colon polyps   . Diabetes ([place])   . Dupre's syndrome   . GERD (gastroesophageal reflux disease)   . Hyperlipidemia   . Hypertension   . Migraine   . Neuropathy   . Peripheral vascular disease ([place])   . Retinopathy   . Sleep apnea   . Stage 3 chronic kidney disease   . Stomach ulcer      Vital Signs: BP (!) 148/63   Pulse 61   Temp (!) 97.2 F (36.2 C)   Resp 16   Ht 6\' 2"  (1.88 m)   Wt 237 lb 3.2 oz (107.6 kg)   SpO2 95%   BMI 30.45 kg/m    Review of Systems  Constitutional: Negative.  Negative for chills, fatigue and unexpected weight change.  HENT: Negative.  Negative for congestion, rhinorrhea, sneezing and sore throat.   Eyes: Negative for redness.  Respiratory: Negative.  Negative for cough, chest tightness and shortness of breath.   Cardiovascular: Positive for leg swelling. Negative for chest pain and palpitations.  Gastrointestinal: Negative.  Negative for abdominal pain, constipation, diarrhea, nausea and vomiting.  Endocrine: Negative.   Genitourinary: Negative.  Negative for dysuria and frequency.  Musculoskeletal: Negative.  Negative for arthralgias, back pain, joint swelling and neck pain.  Skin: Negative.  Negative for rash.  Allergic/Immunologic: Negative.   Neurological: Negative.  Negative for tremors and numbness.  Hematological: Negative for adenopathy. Does not bruise/bleed easily.  Psychiatric/Behavioral: Negative.  Negative for behavioral problems, sleep disturbance  and suicidal ideas. The patient is not nervous/anxious.     Physical Exam Vitals and nursing note reviewed.  Constitutional:      General: He is not in acute distress.    Appearance: He is well-developed. He is not diaphoretic.  HENT:     Head: Normocephalic and atraumatic.     Mouth/Throat:     Pharynx: No oropharyngeal exudate.  Eyes:     Pupils: Pupils are equal, round, and reactive to light.  Neck:     Thyroid: No thyromegaly.     Vascular: No JVD.     Trachea: No tracheal deviation.  Cardiovascular:     Rate and Rhythm: Normal rate and regular rhythm.     Heart sounds: Normal heart sounds. No murmur. No friction rub. No gallop.   Pulmonary:     Effort: Pulmonary effort is normal. No respiratory distress.     Breath sounds: Normal breath sounds. No wheezing or rales.  Chest:     Chest wall: No tenderness.  Abdominal:     Palpations: Abdomen is soft.     Tenderness: There is no abdominal tenderness. There is no guarding.  Musculoskeletal:        General: Normal range of motion.     Cervical back: Normal range of motion and neck supple.  Lymphadenopathy:     Cervical: No cervical adenopathy.  Skin:    General: Skin is warm and dry.  Neurological:     Mental Status: He is alert and oriented to person, place, and time.     Cranial Nerves: No cranial nerve deficit.  Psychiatric:        Behavior: Behavior normal.        Thought Content: Thought content normal.        Judgment: Judgment normal.    Assessment/Plan: 1. Essential hypertension Improving, but remains slightly elevated.   2. Shortness of breath Will get Chest Ct for eval.  - CT CHEST WO CONTRAST; Future  3. Pressure injury, stage 2, with suspected deep tissue injury ([place]) Continue to follow wound center guidance.   4. Type 2 diabetes mellitus with diabetic neuropathy, with long-term current use of insulin ([place]) Remains uncontrolled, continue to follow up with Endocrinology.   General Counseling:  [name] [name] understanding of the findings of todays visit and agrees with plan of treatment. I have discussed any further diagnostic evaluation that may be needed or ordered today. We also reviewed his medications today. he has been encouraged to call the office with any questions or concerns that should arise related to todays visit.   Orders Placed This Encounter  Procedures  . CT CHEST WO CONTRAST    No orders of the defined types were placed in this encounter.   Time spent: 30 Minutes  This patient was seen by Orson Gear AGNP-C in Collaboration with Dr [name] as a part  of collaborative care agreement.  Kendell Bane AGNP-C Internal Medicine

## 2019-10-29 ENCOUNTER — Encounter: Payer: Managed Care, Other (non HMO) | Attending: Physician Assistant | Admitting: Physician Assistant

## 2019-10-29 ENCOUNTER — Telehealth: Payer: Self-pay

## 2019-10-29 DIAGNOSIS — N183 Chronic kidney disease, stage 3 unspecified: Secondary | ICD-10-CM | POA: Insufficient documentation

## 2019-10-29 DIAGNOSIS — E1140 Type 2 diabetes mellitus with diabetic neuropathy, unspecified: Secondary | ICD-10-CM | POA: Insufficient documentation

## 2019-10-29 DIAGNOSIS — G4730 Sleep apnea, unspecified: Secondary | ICD-10-CM | POA: Insufficient documentation

## 2019-10-29 DIAGNOSIS — L97811 Non-pressure chronic ulcer of other part of right lower leg limited to breakdown of skin: Secondary | ICD-10-CM | POA: Diagnosis not present

## 2019-10-29 DIAGNOSIS — L84 Corns and callosities: Secondary | ICD-10-CM | POA: Insufficient documentation

## 2019-10-29 DIAGNOSIS — M1990 Unspecified osteoarthritis, unspecified site: Secondary | ICD-10-CM | POA: Insufficient documentation

## 2019-10-29 DIAGNOSIS — L97512 Non-pressure chronic ulcer of other part of right foot with fat layer exposed: Secondary | ICD-10-CM | POA: Diagnosis not present

## 2019-10-29 DIAGNOSIS — E1122 Type 2 diabetes mellitus with diabetic chronic kidney disease: Secondary | ICD-10-CM | POA: Diagnosis not present

## 2019-10-29 DIAGNOSIS — I5042 Chronic combined systolic (congestive) and diastolic (congestive) heart failure: Secondary | ICD-10-CM | POA: Insufficient documentation

## 2019-10-29 DIAGNOSIS — I130 Hypertensive heart and chronic kidney disease with heart failure and stage 1 through stage 4 chronic kidney disease, or unspecified chronic kidney disease: Secondary | ICD-10-CM | POA: Diagnosis not present

## 2019-10-29 DIAGNOSIS — I2510 Atherosclerotic heart disease of native coronary artery without angina pectoris: Secondary | ICD-10-CM | POA: Insufficient documentation

## 2019-10-29 DIAGNOSIS — E11622 Type 2 diabetes mellitus with other skin ulcer: Secondary | ICD-10-CM | POA: Insufficient documentation

## 2019-10-29 DIAGNOSIS — E11621 Type 2 diabetes mellitus with foot ulcer: Secondary | ICD-10-CM | POA: Insufficient documentation

## 2019-10-29 DIAGNOSIS — L97519 Non-pressure chronic ulcer of other part of right foot with unspecified severity: Secondary | ICD-10-CM | POA: Diagnosis present

## 2019-10-29 NOTE — Telephone Encounter (Signed)
Confirmed and screened for 11-01-19 ov. 

## 2019-10-29 NOTE — Progress Notes (Addendum)
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 out what exactly is going on with his physicians to get something under control. He sees his kidney doctor on Monday and his primary care provider next week as well. I think he may also need to see his cardiologist. Objective Constitutional Well-nourished and well-hydrated in no acute distress. Vitals Time Taken: 12:50 PM, Height: 74 in, Weight: 221 lbs, BMI: 28.4, Temperature: 97.9 F, Pulse: 62 bpm, Respiratory Rate: 16 breaths/min, Blood Pressure: 173/70 mmHg. Respiratory normal breathing without difficulty. Psychiatric this patient is able to make decisions and demonstrates good insight into disease process. Alert and Oriented x 3. pleasant and cooperative. General Notes: Patient's wounds do not appear to be infected and overall very pleased in that regard. With that being said he unfortunately is not showing signs of significant improvement but nonetheless I think is also not showing any signs of significant worsening. I would recommend currently that we go ahead and just continue with the [facility] which I think has done fairly well for him at this point. Integumentary (Hair, Skin) Wound #3 status is Open. Original cause of wound was Gradually Appeared. The wound is located on the Right,Distal,Lateral Lower Leg.  The wound measures 1cm length x 1.5cm width x 0.6cm depth; 1.178cm^2 area and 0.707cm^3 volume. There is Fat Layer (Subcutaneous Tissue) Exposed exposed. There is no tunneling or undermining noted. There is a medium amount of serous drainage noted. The wound margin is flat and intact. There is medium (34-66%) pink granulation within the wound bed. There is a medium (34-66%) amount of necrotic tissue within the wound bed including Adherent Slough. Wound #4  status is Open. Original cause of wound was Gradually Appeared. The wound is located on the Right,Proximal,Lateral Lower Leg. The wound measures 0.2cm length x 0.3cm width x 0.4cm depth; 0.047cm^2 area and 0.019cm^3 volume. There is Fat Layer (Subcutaneous Tissue) Exposed exposed. There is a medium amount of serous drainage noted. The wound margin is flat and intact. There is no granulation within the wound bed. There is no necrotic tissue within the wound bed. 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 May shower with protection. Wound #4 Right,Proximal,Lateral Lower Leg: Clean wound with Normal Saline. May shower with protection. Anesthetic (add to Medication List): Wound #3 Right,Distal,Lateral Lower Leg: Topical Lidocaine 4% cream applied to wound bed prior to debridement (In Clinic Only). Wound #4 Right,Proximal,Lateral Lower Leg: Topical Lidocaine 4% cream  applied to wound bed prior to debridement (In Clinic Only). Primary Wound Dressing: Wound #3 Right,Distal,Lateral Lower Leg: Silver Collagen Wound #4 Right,Proximal,Lateral Lower Leg: Silver Collagen Secondary Dressing: Wound #3 Right,Distal,Lateral Lower Leg: Boardered Foam Dressing Wound #4 Right,Proximal,Lateral Lower Leg: Boardered Foam Dressing Dressing Change Frequency: Wound #3 Right,Distal,Lateral Lower Leg: Change dressing every other day. Wound #4 Right,Proximal,Lateral Lower Leg: Change dressing every other day. Follow-up Appointments: Wound #3 Right,Distal,Lateral Lower Leg: Return Appointment in 1 week. Wound #4 Right,Proximal,Lateral Lower Leg: Return Appointment in 1 week. Edema Control: Wound #3 Right,Distal,Lateral Lower Leg: Elevate legs to the level of the heart and pump ankles as often as possible Wound #4 Right,Proximal,Lateral Lower Leg: Elevate legs to the level of the heart and pump ankles as often as possible 1. We will continue with the [facility] dressing and the patient is in agreement with that plan. 2. We will also continue with the elevation as much as possible to try to keep his edema under control. Again compression wraps were not can to help him at this time due to the fact that he has issues with breathing when he has the wrap on and also lymphedema pumps or not to be appropriate either for the same reason. 3. I did encourage him to follow-up with his kidney doctor next week as well as his primary care and cardiologist I think that they need to get to the bottom of exactly what is going on for him so that he can hopefully see signs of improvement and move forward both in regard to his wound healing and as well as his overall health and wellbeing. We will see patient back for reevaluation in 1 week here in the clinic. If anything worsens or changes patient will contact our office for additional recommendations. Electronic Signature(s) Signed:  10/29/2019 4:46:36 PM By: Worthy Keeler PA-C Entered By: Worthy Keeler on 10/29/2019 16:46:36 [name] [name] ([id number]) -------------------------------------------------------------------------------- SuperBill Details Patient Name: [name] [name] Date of Service: 10/29/2019 Medical Record Number: [id number] Patient Account Number: [id number] Date of Birth/Sex: 15-Nov-1960 (58 y.o. M) Treating RN: [name] Primary Care Provider: [name] Other Clinician: Referring Provider: [name] Treating Provider/Extender: [name], Lindwood Mogel Weeks in Treatment: 14 Diagnosis Coding ICD-10 Codes Code Description [phone]  Type 2 diabetes mellitus with foot ulcer L97.512 Non-pressure chronic ulcer of other part of right foot with fat layer exposed I50.42 Chronic combined systolic (congestive) and diastolic (congestive) heart failure I10 Essential (primary) hypertension I25.10 Atherosclerotic heart disease of native coronary artery without angina pectoris N18.30 Chronic kidney disease, stage 3 unspecified L84 Corns and callosities L97.811 Non-pressure chronic ulcer of other part of right lower leg limited to breakdown of skin Facility Procedures CPT4 Code: [id number] Description: 99213 - WOUND CARE VISIT-LEV 3 EST PT Modifier: Quantity: 1 Physician Procedures CPT4 Code: [id number] Description: 99213 - WC PHYS LEVEL 3 - EST PT Modifier: Quantity: 1 CPT4 Code: Description: ICD-10 Diagnosis Description E11.621 Type 2 diabetes mellitus with foot ulcer L97.512 Non-pressure chronic ulcer of other part of right foot with fat layer ex I50.42 Chronic combined systolic (congestive) and diastolic (congestive) heart I10  Essential (primary) hypertension Modifier: posed failure Quantity: Electronic Signature(s) Signed: 10/29/2019 4:46:54 PM By: Worthy Keeler PA-C Entered By: Worthy Keeler on 10/29/2019 16:46:53

## 2019-11-01 ENCOUNTER — Other Ambulatory Visit: Payer: Self-pay | Admitting: Internal Medicine

## 2019-11-01 ENCOUNTER — Encounter: Payer: Self-pay | Admitting: Internal Medicine

## 2019-11-01 ENCOUNTER — Other Ambulatory Visit: Payer: Self-pay | Admitting: Nephrology

## 2019-11-01 ENCOUNTER — Ambulatory Visit: Payer: Managed Care, Other (non HMO) | Admitting: Internal Medicine

## 2019-11-01 ENCOUNTER — Other Ambulatory Visit: Payer: Self-pay

## 2019-11-01 DIAGNOSIS — I5032 Chronic diastolic (congestive) heart failure: Secondary | ICD-10-CM | POA: Diagnosis not present

## 2019-11-01 DIAGNOSIS — N1832 Chronic kidney disease, stage 3b: Secondary | ICD-10-CM | POA: Diagnosis not present

## 2019-11-01 DIAGNOSIS — E1140 Type 2 diabetes mellitus with diabetic neuropathy, unspecified: Secondary | ICD-10-CM

## 2019-11-01 DIAGNOSIS — Z794 Long term (current) use of insulin: Secondary | ICD-10-CM | POA: Diagnosis not present

## 2019-11-01 DIAGNOSIS — G4733 Obstructive sleep apnea (adult) (pediatric): Secondary | ICD-10-CM

## 2019-11-01 DIAGNOSIS — Z789 Other specified health status: Secondary | ICD-10-CM

## 2019-11-01 LAB — POCT GLYCOSYLATED HEMOGLOBIN (HGB A1C): Hemoglobin A1C: 12.2 % — AB (ref 4.0–5.6)

## 2019-11-01 LAB — POCT CBG (FASTING - GLUCOSE)-MANUAL ENTRY: Glucose Fasting, POC: 225 mg/dL — AB (ref 70–99)

## 2019-11-01 NOTE — Progress Notes (Signed)
[facility] [place], [place] [address]  Internal MEDICINE  Office Visit Note  Patient Name: [name] [name]  [id number]  [id number]  Date of Service: 11/06/2019  Chief Complaint  Patient presents with  . Follow-up    Leg swelling   . Hyperlipidemia  . Hypertension  . Diabetes  . Congestive Heart Failure  . Gastroesophageal Reflux  . Quality Metric Gaps    foot exam and annual exam    HPI  Pt is here with multiple medical problems. C/O elevated blood sugars, thinks he has a rare diabetic problem and is unable to control it, he has seen multiple endocrinologist. Pt also has CKD as well. Blilateral lower ex edema as well. Was given by cardiology and nephrology but is not taking the prescribed dose. Pt has Cad/ s/p CABG and then angioplasty. Also has OSA and is scheduled to have CPAP titration study  Current Medication: Outpatient Encounter Medications as of 11/01/2019  Medication Sig  . albuterol (VENTOLIN HFA) 108 (90 Base) MCG/ACT inhaler Inhale 2 puffs into the lungs every 6 (six) hours as needed for wheezing or shortness of breath.  . Alirocumab (PRALUENT) 75 MG/ML SOAJ Inject 75 mg into the skin every 14 (fourteen) days.  . carvedilol (COREG) 12.5 MG tablet Take 12.5 mg by mouth 2 (two) times daily with a meal.  . ezetimibe (ZETIA) 10 MG tablet Take 10 mg by mouth daily.  . fluticasone furoate-vilanterol (BREO ELLIPTA) 100-25 MCG/INH AEPB Inhale 1 puff into the lungs daily.  . hydrALAZINE (APRESOLINE) 50 MG tablet Take 1 tablet (50 mg total) by mouth 3 (three) times daily.  . insulin degludec (TRESIBA FLEXTOUCH) 100 UNIT/ML SOPN FlexTouch Pen Inject 140 Units into the skin daily.  . insulin lispro (HUMALOG KWIKPEN) 100 UNIT/ML KwikPen Inject into the skin. 0-20 units per sliding scale  . pantoprazole (PROTONIX) 40 MG tablet Take 40 mg by mouth daily.  Marland Kitchen torsemide (DEMADEX) 20 MG tablet Take 2 tablets (40 mg total) by mouth 2 (two) times daily. Take 2  tablets (40 mg) daily with 2 tablets (40 mg) in the afternoon as needed   No facility-administered encounter medications on file as of 11/01/2019.    Surgical History: Past Surgical History:  Procedure Laterality Date  . bone graft surgery    . CARDIAC CATHETERIZATION  2013   S/p PCI   . CARDIAC CATHETERIZATION  2018   S/p CABG  . CARDIAC SURGERY    . CORONARY ARTERY BYPASS GRAFT  2018   (LIMA-LAD,VG-RCA,VG-OM1,VG-D1)  . EYE SURGERY    . PERIPHERAL VASCULAR CATHETERIZATION Right 10/28/2016   PTA/DEB Right SFA  . WRIST SURGERY      Medical History: Past Medical History:  Diagnosis Date  . Arrhythmia   . Arthritis   . CAD (coronary artery disease)   . Cardiomyopathy, ischemic   . CHF (congestive heart failure) (HCC)    NYHA II-III  . Colon polyps   . Diabetes ([place])   . Dupre's syndrome   . GERD (gastroesophageal reflux disease)   . Hyperlipidemia   . Hypertension   . Migraine   . Neuropathy   . Peripheral vascular disease ([place])   . Retinopathy   . Sleep apnea   . Stage 3 chronic kidney disease   . Stomach ulcer     Family History: Family History  Problem Relation Age of Onset  . Diabetes Mother   . Heart disease Father     Social History   Socioeconomic History  .  Marital status: Married    Spouse name: Not on file  . Number of children: Not on file  . Years of education: Not on file  . Highest education level: Not on file  Occupational History  . Not on file  Tobacco Use  . Smoking status: Former Smoker    Types: Cigarettes    Quit date: 05/18/2012    Years since quitting: 7.4  . Smokeless tobacco: Never Used  Vaping Use  . Vaping Use: Never used  Substance and Sexual Activity  . Alcohol use: Yes    Comment: very rarely  . Drug use: Never  . Sexual activity: Not on file  Other Topics Concern  . Not on file  Social History Narrative  . Not on file   Social Determinants of Health   Financial Resource Strain:   . Difficulty of Paying  Living Expenses:   Food Insecurity:   . Worried About Charity fundraiser in the Last Year:   . Arboriculturist in the Last Year:   Transportation Needs:   . Film/video editor (Medical):   Marland Kitchen Lack of Transportation (Non-Medical):   Physical Activity:   . Days of Exercise per Week:   . Minutes of Exercise per Session:   Stress:   . Feeling of Stress :   Social Connections:   . Frequency of Communication with Friends and Family:   . Frequency of Social Gatherings with Friends and Family:   . Attends Religious Services:   . Active Member of Clubs or Organizations:   . Attends Archivist Meetings:   Marland Kitchen Marital Status:   Intimate Partner Violence:   . Fear of Current or Ex-Partner:   . Emotionally Abused:   Marland Kitchen Physically Abused:   . Sexually Abused:       Review of Systems  Constitutional: Negative for chills, fatigue and unexpected weight change.  HENT: Negative for congestion, rhinorrhea, sneezing and sore throat.   Eyes: Negative for redness.  Respiratory: Negative for cough, chest tightness and shortness of breath.   Cardiovascular: Positive for leg swelling. Negative for chest pain and palpitations.  Gastrointestinal: Negative for abdominal pain, constipation, diarrhea, nausea and vomiting.  Endocrine:       Fluctuating blood sugars   Genitourinary: Negative for dysuria and frequency.  Musculoskeletal: Negative for arthralgias, back pain, joint swelling and neck pain.  Skin: Negative for rash.  Neurological: Negative.  Negative for tremors and numbness.  Hematological: Negative for adenopathy. Does not bruise/bleed easily.  Psychiatric/Behavioral: Negative for behavioral problems (Depression), sleep disturbance and suicidal ideas. The patient is not nervous/anxious.     Vital Signs: BP (!) 131/55   Pulse 62   Temp (!) 97.4 F (36.3 C)   Resp 16   Ht 6\' 2"  (1.88 m)   Wt 229 lb (103.9 kg)   SpO2 99%   BMI 29.40 kg/m    Physical Exam Constitutional:       General: He is not in acute distress.    Appearance: He is well-developed. He is not diaphoretic.  HENT:     Head: Normocephalic and atraumatic.     Mouth/Throat:     Pharynx: No oropharyngeal exudate.  Eyes:     Pupils: Pupils are equal, round, and reactive to light.  Neck:     Thyroid: No thyromegaly.     Vascular: No JVD.     Trachea: No tracheal deviation.  Cardiovascular:     Rate and Rhythm: Normal rate  and regular rhythm.     Heart sounds: Normal heart sounds. No murmur heard.  No friction rub. No gallop.   Pulmonary:     Effort: Pulmonary effort is normal. No respiratory distress.     Breath sounds: No wheezing or rales.  Chest:     Chest wall: No tenderness.  Abdominal:     General: Bowel sounds are normal.     Palpations: Abdomen is soft.  Musculoskeletal:        General: Swelling present. Normal range of motion.     Cervical back: Normal range of motion and neck supple.     Right lower leg: Edema present.     Left lower leg: Edema present.  Lymphadenopathy:     Cervical: No cervical adenopathy.  Skin:    General: Skin is warm and dry.  Neurological:     Mental Status: He is alert and oriented to person, place, and time.     Cranial Nerves: No cranial nerve deficit.  Psychiatric:        Behavior: Behavior normal.        Thought Content: Thought content normal.        Judgment: Judgment normal.    Assessment/Plan: 1. Type 2 diabetes mellitus with diabetic neuropathy, with long-term current use of insulin (HCC) - Today hg a1c is 12.2. pt continues to use and change dose by himself. Sees endocrinology.I am not sure if he was investigated for insulin ab or other causes, might get benefit from insulin pump - POCT CBG (Fasting - Glucose) - POCT HgB A1C  2. Stage 3b chronic kidney disease - Per Nephrology, pt is not on ACE inh or ARB at this time due to side effects  3. OSA (obstructive sleep apnea) - He scheduled to have CPAP titration study  4. Chronic  diastolic heart failure (HCC) - Per cardio, pt is suppse to be on Demadex 40 mg bid but takes only once a day, spoke with Dr Candiss Norse, he did add metolazone. Recent echo did show garde 2 diastolic dysfunction    5. Statin intolerance - pt is unable to take Statin but is on Praluent   General Counseling: doctor sheahan understanding of the findings of todays visit and agrees with plan of treatment. I have discussed any further diagnostic evaluation that may be needed or ordered today. We also reviewed his medications today. he has been encouraged to call the office with any questions or concerns that should arise related to todays visit. Cardiac risk factor modification:  1. Control blood pressure. 2. Exercise as prescribed. 3. Follow low sodium, low fat diet. and low fat and low cholestrol diet. 4. Take ASA 81mg  once a day. 5. Restricted calories diet to lose weight. Orders Placed This Encounter  Procedures  . POCT CBG (Fasting - Glucose)  . POCT HgB A1C    Total time spent: 35 Minutes Time spent includes review of chart, medications, test results, and follow up plan with the patient.    Dr [name] Internal medicine

## 2019-11-03 ENCOUNTER — Ambulatory Visit (INDEPENDENT_AMBULATORY_CARE_PROVIDER_SITE_OTHER): Payer: Managed Care, Other (non HMO) | Admitting: Internal Medicine

## 2019-11-03 ENCOUNTER — Encounter: Payer: Managed Care, Other (non HMO) | Admitting: Internal Medicine

## 2019-11-03 DIAGNOSIS — G4733 Obstructive sleep apnea (adult) (pediatric): Secondary | ICD-10-CM | POA: Diagnosis not present

## 2019-11-03 NOTE — Progress Notes (Signed)
[name], [name] ([id number]) Visit Report for 10/29/2019 Arrival Information Details Patient Name: [name] [name] Date of Service: 10/29/2019 1:00 PM Medical Record Number: [id number] Patient Account Number: [id number] Date of Birth/Sex: [birth year] (59 y.o. M) Treating RN: [name] Primary Care Lysa Livengood: [name] Other Clinician: Referring Charese Abundis: [name] Treating Demorris Choyce/Extender: [name], [name]: 14 Visit Information History Since Last Visit Added or deleted any medications: No Patient Arrived: Cane Any new allergies or adverse reactions: No Arrival Time: 12:51 Had a fall or experienced change in No Accompanied By: self activities of daily living that may affect Transfer Assistance: None risk of falls: Patient Identification Verified: Yes Signs or symptoms of abuse/neglect since last visito No Secondary Verification Process Completed: Yes Hospitalized since last visit: No Implantable device outside of the clinic excluding No cellular tissue based products placed in the center since last visit: Has Dressing in Place as Prescribed: Yes Pain Present Now: No Electronic Signature(s) Signed: 10/29/2019 4:07:07 PM By: Lorine Bears RCP, RRT, CHT Entered By: Lorine Bears on 10/29/2019 12:53:43 [name] [name] ([id number]) -------------------------------------------------------------------------------- Clinic Level of Care Assessment Details Patient Name: [name] [name] Date of Service: 10/29/2019 1:00 PM Medical Record Number: [id number] Patient Account Number: [id number] Date of Birth/Sex: [birth year] (58 y.o. M) Treating RN: [name] Primary Care Anice Wilshire: [name] Other Clinician: Referring Audrionna Lampton: [name] Treating Janzen Sacks/Extender: [name], [name]: 14 Clinic Level of Care Assessment Items TOOL 4 Quantity Score []  - Use when only an EandM is performed on FOLLOW-UP visit  0 ASSESSMENTS - Nursing Assessment / Reassessment X - Reassessment of Co-morbidities (includes updates in patient status) 1 10 X- 1 5 Reassessment of Adherence to Treatment Plan ASSESSMENTS - Wound and Skin Assessment / Reassessment []  - Simple Wound Assessment / Reassessment - one wound 0 X- 2 5 Complex Wound Assessment / Reassessment - multiple wounds []  - 0 Dermatologic / Skin Assessment (not related to wound area) ASSESSMENTS - Focused Assessment []  - Circumferential Edema Measurements - multi extremities 0 []  - 0 Nutritional Assessment / Counseling / Intervention X- 1 5 Lower Extremity Assessment (monofilament, tuning fork, pulses) []  - 0 Peripheral Arterial Disease Assessment (using hand held doppler) ASSESSMENTS - Ostomy and/or Continence Assessment and Care []  - Incontinence Assessment and Management 0 []  - 0 Ostomy Care Assessment and Management (repouching, etc.) PROCESS - Coordination of Care X - Simple Patient / Family Education for ongoing care 1 15 []  - 0 Complex (extensive) Patient / Family Education for ongoing care X- 1 10 Staff obtains Programmer, systems, Records, Test Results / Process Orders []  - 0 Staff telephones HHA, Nursing Homes / Clarify orders / etc []  - 0 Routine Transfer to another Facility (non-emergent condition) []  - 0 Routine Hospital Admission (non-emergent condition) []  - 0 New Admissions / Biomedical engineer / Ordering NPWT, Apligraf, etc. []  - 0 Emergency Hospital Admission (emergent condition) X- 1 10 Simple Discharge Coordination []  - 0 Complex (extensive) Discharge Coordination PROCESS - Special Needs []  - Pediatric / Minor Patient Management 0 []  - 0 Isolation Patient Management []  - 0 Hearing / Language / Visual special needs []  - 0 Assessment of Community assistance (transportation, D/C planning, etc.) []  - 0 Additional assistance / Altered mentation []  - 0 Support Surface(s) Assessment (bed, cushion, seat,  etc.) INTERVENTIONS - Wound Cleansing / Measurement [name] [name], [name] [name] ([id number]) []  - 0 Simple Wound Cleansing - one wound X- 2 5 Complex Wound Cleansing - multiple wounds X- 1 5 Wound Imaging (photographs -  any number of wounds) []  - 0 Wound Tracing (instead of photographs) []  - 0 Simple Wound Measurement - one wound X- 2 5 Complex Wound Measurement - multiple wounds INTERVENTIONS - Wound Dressings X - Small Wound Dressing one or multiple wounds 1 10 []  - 0 Medium Wound Dressing one or multiple wounds []  - 0 Large Wound Dressing one or multiple wounds []  - 0 Application of Medications - topical []  - 0 Application of Medications - injection INTERVENTIONS - Miscellaneous []  - External ear exam 0 []  - 0 Specimen Collection (cultures, biopsies, blood, body fluids, etc.) []  - 0 Specimen(s) / Culture(s) sent or taken to Lab for analysis []  - 0 Patient Transfer (multiple staff / Civil Service fast streamer / Similar devices) []  - 0 Simple Staple / Suture removal (25 or less) []  - 0 Complex Staple / Suture removal (26 or more) []  - 0 Hypo / Hyperglycemic Management (close monitor of Blood Glucose) []  - 0 Ankle / Brachial Index (ABI) - do not check if billed separately X- 1 5 Vital Signs Has the patient been seen at the hospital within the last three years: Yes Total Score: 105 Level Of Care: New/Established - Level 3 Electronic Signature(s) Signed: 10/29/2019 4:32:38 PM By: [name] Entered By: [name] on 10/29/2019 13:28:05 [name] [name] ([id number]) -------------------------------------------------------------------------------- Encounter Discharge Information Details Patient Name: [name] [name] Date of Service: 10/29/2019 1:00 PM Medical Record Number: [id number] Patient Account Number: [id number] Date of Birth/Sex: 15-Mar-1961 (58 y.o. M) Treating RN: [name] Primary Care Thailan Sava: [name] Other Clinician: Referring Kendra Woolford: [name] Treating  Teresa Nicodemus/Extender: [name], [name]: 14 Encounter Discharge Information Items Discharge Condition: Stable Ambulatory Status: Cane Discharge Destination: Home Transportation: Private Auto Accompanied By: self Schedule Follow-up Appointment: Yes Clinical Summary of Care: Electronic Signature(s) Signed: 10/29/2019 4:32:38 PM By: [name] Entered By: [name] on 10/29/2019 13:29:02 [name] [name] ([id number]) -------------------------------------------------------------------------------- Lower Extremity Assessment Details Patient Name: [name] [name] Date of Service: 10/29/2019 1:00 PM Medical Record Number: [id number] Patient Account Number: [id number] Date of Birth/Sex: 03/10/1961 (58 y.o. M) Treating RN: Cornell Barman Primary Care Rashauna Tep: [name] Other Clinician: Referring Kristy Catoe: [name] Treating Anayely Constantine/Extender: [name], [name]: 14 Edema Assessment Assessed: [Left: No] [Right: No] Edema: [Left: Ye] [Right: s] Calf Left: Right: Point of Measurement: 34 cm From Medial Instep cm 40 cm Ankle Left: Right: Point of Measurement: 11 cm From Medial Instep cm 26 cm Vascular Assessment Pulses: Dorsalis Pedis Palpable: [Right:Yes] Electronic Signature(s) Signed: 11/03/2019 7:21:37 AM By: Gretta Cool, BSN, RN, CWS, Kim RN, BSN Entered By: Gretta Cool, BSN, RN, CWS, Kim on 10/29/2019 13:05:10 [name] [name] ([id number]) -------------------------------------------------------------------------------- Multi Wound Chart Details Patient Name: [name] [name] Date of Service: 10/29/2019 1:00 PM Medical Record Number: [id number] Patient Account Number: [id number] Date of Birth/Sex: 07/23/1960 (58 y.o. M) Treating RN: [name] Primary Care Isla Sabree: [name] Other Clinician: Referring Chasin Findling: [name] Treating Royden Bulman/Extender: [name], [name]: 14 Vital Signs Height(in): 74 Pulse(bpm): 63 Weight(lbs):  221 Blood Pressure(mmHg): 173/70 Body Mass Index(BMI): 28 Temperature(F): 97.9 Respiratory Rate(breaths/min): 16 Photos: [N/A:N/A] Wound Location: Right, Distal, Lateral Lower Leg Right, Proximal, Lateral Lower Leg N/A Wounding Event: Gradually Appeared Gradually Appeared N/A Primary Etiology: Venous Leg Ulcer Venous Leg Ulcer N/A Comorbid History: Sleep Apnea, Congestive Heart Sleep Apnea, Congestive Heart N/A Failure, Coronary Artery Disease, Failure, Coronary Artery Disease, Hypertension, Peripheral Venous Hypertension, Peripheral Venous Disease, Type II Diabetes, History Disease, Type II Diabetes, History of Burn, Osteoarthritis, Neuropathy of Burn,  Osteoarthritis, Neuropathy Date Acquired: 08/31/2019 09/01/2019 N/A Weeks of Treatment: 8 8 N/A Wound Status: Open Open N/A Measurements L x W x D (cm) 1x1.5x0.6 0.2x0.3x0.4 N/A Area (cm) : 1.178 0.047 N/A Volume (cm) : 0.707 0.019 N/A % Reduction in Area: 64.30% 93.20% N/A % Reduction in Volume: -114.20% 72.50% N/A Classification: Full Thickness Without Exposed Full Thickness Without Exposed N/A Support Structures Support Structures Exudate Amount: Medium Medium N/A Exudate Type: Serous Serous N/A Exudate Color: amber amber N/A Wound Margin: Flat and Intact Flat and Intact N/A Granulation Amount: Medium (34-66%) None Present (0%) N/A Granulation Quality: Pink N/A N/A Necrotic Amount: Medium (34-66%) None Present (0%) N/A Exposed Structures: Fat Layer (Subcutaneous Tissue) Fat Layer (Subcutaneous Tissue) N/A Exposed: Yes Exposed: Yes Fascia: No Fascia: No Tendon: No Tendon: No Muscle: No Muscle: No Joint: No Joint: No Bone: No Bone: No Epithelialization: None Medium (34-66%) N/A Treatment Notes Electronic Signature(s) Signed: 10/29/2019 4:32:38 PM By: [name] Entered By: [name] on 10/29/2019 13:24:37 [name] [name] ([id number] [name]  ([id number]) -------------------------------------------------------------------------------- Multi-Disciplinary Care Plan Details Patient Name: [name] [name] Date of Service: 10/29/2019 1:00 PM Medical Record Number: [id number] Patient Account Number: [id number] Date of Birth/Sex: 12-23-1960 (59 y.o. M) Treating RN: [name] Primary Care Jazae Gandolfi: [name] Other Clinician: Referring Vonette Grosso: [name] Treating Ulisses Vondrak/Extender: [name], [name]: 60 Active Inactive Orientation to the Wound Care Program Nursing Diagnoses: Knowledge deficit related to the wound healing center program Goals: Patient/caregiver will verbalize understanding of the [place] Program Date Initiated: 07/22/2019 Target Resolution Date: 08/06/2019 Goal Status: Active Interventions: Provide education on orientation to the wound center Notes: Wound/Skin Impairment Nursing Diagnoses: Impaired tissue integrity Goals: Ulcer/skin breakdown will have a volume reduction of 30% by week 4 Date Initiated: 07/22/2019 Target Resolution Date: 08/17/2019 Goal Status: Active Interventions: Assess ulceration(s) every visit Notes: Electronic Signature(s) Signed: 10/29/2019 4:32:38 PM By: [name] Entered By: [name] on 10/29/2019 13:24:28 [name] [name] ([id number]) -------------------------------------------------------------------------------- Pain Assessment Details Patient Name: [name] [name] Date of Service: 10/29/2019 1:00 PM Medical Record Number: [id number] Patient Account Number: [id number] Date of Birth/Sex: 1960/06/21 (59 y.o. M) Treating RN: Cornell Barman Primary Care Rishi Vicario: [name] Other Clinician: Referring Sayan Aldava: [name] Treating Tivon Lemoine/Extender: [name], [name]: 14 Active Problems Location of Pain Severity and Description of Pain Patient Has Paino No Site Locations Pain Management and Medication Current Pain  Management: Notes Patient denies pain at this time. Electronic Signature(s) Signed: 11/03/2019 7:21:37 AM By: Gretta Cool, BSN, RN, CWS, Kim RN, BSN Entered By: Gretta Cool, BSN, RN, CWS, Kim on 10/29/2019 12:56:10 [name] [name] ([id number]) -------------------------------------------------------------------------------- Patient/Caregiver Education Details Patient Name: [name] [name] Date of Service: 10/29/2019 1:00 PM Medical Record Number: [id number] Patient Account Number: [id number] Date of Birth/Gender: 1960/08/25 (58 y.o. M) Treating RN: [name] Primary Care Physician: [name] Other Clinician: Referring Physician: [name] Treating Physician/Extender: Sharalyn Ink in Treatment: 14 Education Assessment Education Provided To: Patient Education Topics Provided Wound/Skin Impairment: Handouts: Other: wound care as ordered Methods: Demonstration, Explain/Verbal Responses: State content correctly Electronic Signature(s) Signed: 10/29/2019 4:32:38 PM By: [name] Entered By: [name] on 10/29/2019 13:28:23 [name] [name] ([id number]) -------------------------------------------------------------------------------- Wound Assessment Details Patient Name: [name] [name] Date of Service: 10/29/2019 1:00 PM Medical Record Number: [id number] Patient Account Number: [id number] Date of Birth/Sex: 26-Apr-1961 (58 y.o. M) Treating RN: Cornell Barman Primary Care Flay Ghosh: [name] Other Clinician: Referring Jailynn Lavalais: [name] Treating Kaili Castille/Extender: [name], [name]: 14 Wound Status Wound  Number: 3 Primary Venous Leg Ulcer Etiology: Wound Location: Right, Distal, Lateral Lower Leg Wound Open Wounding Event: Gradually Appeared Status: Date Acquired: 08/31/2019 Comorbid Sleep Apnea, Congestive Heart Failure, Coronary Artery Weeks Of Treatment: 8 History: Disease, Hypertension, Peripheral Venous Disease, Type II Clustered Wound:  No Diabetes, History of Burn, Osteoarthritis, Neuropathy Photos Wound Measurements Length: (cm) 1 Width: (cm) 1.5 Depth: (cm) 0.6 Area: (cm) 1.178 Volume: (cm) 0.707 % Reduction in Area: 64.3% % Reduction in Volume: -114.2% Epithelialization: None Tunneling: No Undermining: No Wound Description Classification: Full Thickness Without Exposed Support Structu Wound Margin: Flat and Intact Exudate Amount: Medium Exudate Type: Serous Exudate Color: amber res Foul Odor After Cleansing: No Slough/Fibrino Yes Wound Bed Granulation Amount: Medium (34-66%) Exposed Structure Granulation Quality: Pink Fascia Exposed: No Necrotic Amount: Medium (34-66%) Fat Layer (Subcutaneous Tissue) Exposed: Yes Necrotic Quality: Adherent Slough Tendon Exposed: No Muscle Exposed: No Joint Exposed: No Bone Exposed: No Treatment Notes Wound #3 (Right, Distal, Lateral Lower Leg) Notes prisma, BFD Electronic Signature(s) [name] [name], [name] [name] ([id number]) Signed: 11/03/2019 7:21:37 AM By: Gretta Cool, BSN, RN, CWS, Kim RN, BSN Entered By: Gretta Cool, BSN, RN, CWS, Kim on 10/29/2019 13:02:21 [name] [name] ([id number]) -------------------------------------------------------------------------------- Wound Assessment Details Patient Name: [name] [name] Date of Service: 10/29/2019 1:00 PM Medical Record Number: [id number] Patient Account Number: [id number] Date of Birth/Sex: 08-05-1960 (58 y.o. M) Treating RN: Cornell Barman Primary Care Ariauna Farabee: [name] Other Clinician: Referring Harlo Jaso: [name] Treating Xavius Spadafore/Extender: [name], [name]: 14 Wound Status Wound Number: 4 Primary Venous Leg Ulcer Etiology: Wound Location: Right, Proximal, Lateral Lower Leg Wound Open Wounding Event: Gradually Appeared Status: Date Acquired: 09/01/2019 Comorbid Sleep Apnea, Congestive Heart Failure, Coronary Artery Weeks Of Treatment: 8 History: Disease, Hypertension, Peripheral Venous Disease, Type  II Clustered Wound: No Diabetes, History of Burn, Osteoarthritis, Neuropathy Photos Wound Measurements Length: (cm) 0.2 Width: (cm) 0.3 Depth: (cm) 0.4 Area: (cm) 0.047 Volume: (cm) 0.019 % Reduction in Area: 93.2% % Reduction in Volume: 72.5% Epithelialization: Medium (34-66%) Wound Description Classification: Full Thickness Without Exposed Support Structu Wound Margin: Flat and Intact Exudate Amount: Medium Exudate Type: Serous Exudate Color: amber res Foul Odor After Cleansing: No Slough/Fibrino Yes Wound Bed Granulation Amount: None Present (0%) Exposed Structure Necrotic Amount: None Present (0%) Fascia Exposed: No Fat Layer (Subcutaneous Tissue) Exposed: Yes Tendon Exposed: No Muscle Exposed: No Joint Exposed: No Bone Exposed: No Treatment Notes Wound #4 (Right, Proximal, Lateral Lower Leg) Notes prisma, BFD Electronic Signature(s) [name] [name], [name] [name] ([id number]) Signed: 11/03/2019 7:21:37 AM By: Gretta Cool, BSN, RN, CWS, Kim RN, BSN Entered By: Gretta Cool, BSN, RN, CWS, Kim on 10/29/2019 13:02:49 [name] [name] ([id number]) -------------------------------------------------------------------------------- Vitals Details Patient Name: [name] [name] Date of Service: 10/29/2019 1:00 PM Medical Record Number: [id number] Patient Account Number: [id number] Date of Birth/Sex: 04-02-1961 (59 y.o. M) Treating RN: [name] Primary Care Garan Frappier: [name] Other Clinician: Referring Mercedez Boule: [name] Treating Arieana Somoza/Extender: [name], [name]: 14 Vital Signs Time Taken: 12:50 Temperature (F): 97.9 Height (in): 74 Pulse (bpm): 62 Weight (lbs): 221 Respiratory Rate (breaths/min): 16 Body Mass Index (BMI): 28.4 Blood Pressure (mmHg): 173/70 Reference Range: 80 - 120 mg / dl Electronic Signature(s) Signed: 10/29/2019 4:07:07 PM By: Lorine Bears RCP, RRT, CHT Entered By: Lorine Bears on 10/29/2019 12:54:56

## 2019-11-04 ENCOUNTER — Encounter: Payer: Managed Care, Other (non HMO) | Admitting: Physician Assistant

## 2019-11-04 ENCOUNTER — Telehealth: Payer: Self-pay | Admitting: Cardiovascular Disease

## 2019-11-04 ENCOUNTER — Other Ambulatory Visit: Payer: Self-pay

## 2019-11-04 DIAGNOSIS — E11621 Type 2 diabetes mellitus with foot ulcer: Secondary | ICD-10-CM | POA: Diagnosis not present

## 2019-11-04 NOTE — Telephone Encounter (Signed)
Attempted to complete PA through [email or web address] however the form could not be completed because eligibility could not be determined. Spoke with Sammie Bench at [employer] who states she can not complete the PA over the phone with me however she is able to fax it to our office but this may take up to 24 hours before we receive it. BIN [id number] PCN [id number] Rx [id number] ID [id number] Called patient to verify insurance and was given a different ID for Christella Scheuermann which is [id number]. If we do not receive the form, the rep recommends calling [phone].

## 2019-11-04 NOTE — Telephone Encounter (Signed)
PA for Praluent inititated KEY: [id number] Waiting for determination.

## 2019-11-04 NOTE — Progress Notes (Addendum)
KADIEN, LINEMAN ([id number]) Visit Report for 11/04/2019 Arrival Information Details Patient Name: [name] [name], [name] [name] Date of Service: 11/04/2019 9:15 AM Medical Record Number: [id number] Patient Account Number: [id number] Date of Birth/Sex: 10/30/1960 (59 y.o. M) Treating RN: Cornell Barman Primary Care Daijha Leggio: [name] Other Clinician: Referring Laquilla Dault: [name] Treating Jarquis Walker/Extender: [name], [name]: 15 Visit Information History Since Last Visit Added or deleted any medications: No Patient Arrived: Cane Has Dressing in Place as Prescribed: Yes Arrival Time: 09:26 Pain Present Now: No Accompanied By: self Transfer Assistance: None Patient Identification Verified: Yes Secondary Verification Process Completed: Yes Electronic Signature(s) Signed: 11/05/2019 12:58:54 PM By: Gretta Cool, BSN, RN, CWS, Kim RN, BSN Entered By: Gretta Cool, BSN, RN, CWS, Kim on 11/04/2019 09:27:25 [name] [name] ([id number]) -------------------------------------------------------------------------------- Clinic Level of Care Assessment Details Patient Name: [name] [name] Date of Service: 11/04/2019 9:15 AM Medical Record Number: [id number] Patient Account Number: [id number] Date of Birth/Sex: 10-12-1960 (58 y.o. M) Treating RN: [name] Primary Care Elizabeht Suto: [name] Other Clinician: Referring Keelyn Fjelstad: [name] Treating Aneta Hendershott/Extender: [name], [name]: 15 Clinic Level of Care Assessment Items TOOL 4 Quantity Score []  - Use when only an EandM is performed on FOLLOW-UP visit 0 ASSESSMENTS - Nursing Assessment / Reassessment X - Reassessment of Co-morbidities (includes updates in patient status) 1 10 X- 1 5 Reassessment of Adherence to Treatment Plan ASSESSMENTS - Wound and Skin Assessment / Reassessment []  - Simple Wound Assessment / Reassessment - one wound 0 X- 2 5 Complex Wound Assessment / Reassessment - multiple wounds []  - 0 Dermatologic /  Skin Assessment (not related to wound area) ASSESSMENTS - Focused Assessment []  - Circumferential Edema Measurements - multi extremities 0 []  - 0 Nutritional Assessment / Counseling / Intervention []  - 0 Lower Extremity Assessment (monofilament, tuning fork, pulses) []  - 0 Peripheral Arterial Disease Assessment (using hand held doppler) ASSESSMENTS - Ostomy and/or Continence Assessment and Care []  - Incontinence Assessment and Management 0 []  - 0 Ostomy Care Assessment and Management (repouching, etc.) PROCESS - Coordination of Care X - Simple Patient / Family Education for ongoing care 1 15 []  - 0 Complex (extensive) Patient / Family Education for ongoing care []  - 0 Staff obtains Programmer, systems, Records, Test Results / Process Orders []  - 0 Staff telephones HHA, Nursing Homes / Clarify orders / etc []  - 0 Routine Transfer to another Facility (non-emergent condition) []  - 0 Routine Hospital Admission (non-emergent condition) []  - 0 New Admissions / Biomedical engineer / Ordering NPWT, Apligraf, etc. []  - 0 Emergency Hospital Admission (emergent condition) X- 1 10 Simple Discharge Coordination []  - 0 Complex (extensive) Discharge Coordination PROCESS - Special Needs []  - Pediatric / Minor Patient Management 0 []  - 0 Isolation Patient Management []  - 0 Hearing / Language / Visual special needs []  - 0 Assessment of Community assistance (transportation, D/C planning, etc.) []  - 0 Additional assistance / Altered mentation []  - 0 Support Surface(s) Assessment (bed, cushion, seat, etc.) INTERVENTIONS - Wound Cleansing / Measurement Kozar, Selwyn ([id number]) []  - 0 Simple Wound Cleansing - one wound X- 2 5 Complex Wound Cleansing - multiple wounds X- 1 5 Wound Imaging (photographs - any number of wounds) []  - 0 Wound Tracing (instead of photographs) []  - 0 Simple Wound Measurement - one wound X- 2 5 Complex Wound Measurement - multiple wounds INTERVENTIONS -  Wound Dressings []  - Small Wound Dressing one or multiple wounds 0 X- 2 15 Medium Wound Dressing one or multiple wounds []  -  0 Large Wound Dressing one or multiple wounds []  - 0 Application of Medications - topical []  - 0 Application of Medications - injection INTERVENTIONS - Miscellaneous []  - External ear exam 0 []  - 0 Specimen Collection (cultures, biopsies, blood, body fluids, etc.) []  - 0 Specimen(s) / Culture(s) sent or taken to Lab for analysis []  - 0 Patient Transfer (multiple staff / Civil Service fast streamer / Similar devices) []  - 0 Simple Staple / Suture removal (25 or less) []  - 0 Complex Staple / Suture removal (26 or more) []  - 0 Hypo / Hyperglycemic Management (close monitor of Blood Glucose) []  - 0 Ankle / Brachial Index (ABI) - do not check if billed separately X- 1 5 Vital Signs Has the patient been seen at the hospital within the last three years: Yes Total Score: 110 Level Of Care: New/Established - Level 3 Electronic Signature(s) Signed: 11/04/2019 11:13:05 AM By: [name] Entered By: [name] on 11/04/2019 09:43:31 [name] [name] ([id number]) -------------------------------------------------------------------------------- Encounter Discharge Information Details Patient Name: [name] [name] Date of Service: 11/04/2019 9:15 AM Medical Record Number: [id number] Patient Account Number: [id number] Date of Birth/Sex: [birth year] (58 y.o. M) Treating RN: [name] Primary Care Gertrude Tarbet: [name] Other Clinician: Referring Jaryn Hocutt: [name] Treating Deana Krock/Extender: [name], [name]: 15 Encounter Discharge Information Items Discharge Condition: Stable Ambulatory Status: Ambulatory Discharge Destination: Home Transportation: Private Auto Accompanied By: self Schedule Follow-up Appointment: Yes Clinical Summary of Care: Electronic Signature(s) Signed: 11/04/2019 11:13:05 AM By: [name] Entered By: [name] on  11/04/2019 09:44:40 [name] [name] ([id number]) -------------------------------------------------------------------------------- Lower Extremity Assessment Details Patient Name: [name] [name] Date of Service: 11/04/2019 9:15 AM Medical Record Number: [id number] Patient Account Number: [id number] Date of Birth/Sex: [birth year] (58 y.o. M) Treating RN: Cornell Barman Primary Care Shelie Lansing: [name] Other Clinician: Referring Jesse Hirst: [name] Treating Kianni Lheureux/Extender: [name], [name]: 15 Edema Assessment Assessed: [Left: No] [Right: No] [Left: Edema] [Right: :] Calf Left: Right: Point of Measurement: 34 cm From Medial Instep cm 38.5 cm Ankle Left: Right: Point of Measurement: 11 cm From Medial Instep cm 26 cm Vascular Assessment Pulses: Dorsalis Pedis Palpable: [Right:Yes] Electronic Signature(s) Signed: 11/05/2019 12:58:54 PM By: Gretta Cool, BSN, RN, CWS, Kim RN, BSN Entered By: Gretta Cool, BSN, RN, CWS, Kim on 11/04/2019 09:35:56 [name] [name] ([id number]) -------------------------------------------------------------------------------- Multi Wound Chart Details Patient Name: [name] [name] Date of Service: 11/04/2019 9:15 AM Medical Record Number: [id number] Patient Account Number: [id number] Date of Birth/Sex: [birth year] (58 y.o. M) Treating RN: [name] Primary Care Deitra Craine: [name] Other Clinician: Referring Aryelle Figg: [name] Treating Nickayla Mcinnis/Extender: [name], [name]: 15 Vital Signs Height(in): 74 Pulse(bpm): 10 Weight(lbs): 221 Blood Pressure(mmHg): 142/62 Body Mass Index(BMI): 28 Temperature(F): 98.2 Respiratory Rate(breaths/min): 16 Photos: [N/A:N/A] Wound Location: Right, Distal, Lateral Lower Leg Right, Proximal, Lateral Lower Leg N/A Wounding Event: Gradually Appeared Gradually Appeared N/A Primary Etiology: Venous Leg Ulcer Venous Leg Ulcer N/A Comorbid History: Sleep Apnea, Congestive Heart Sleep  Apnea, Congestive Heart N/A Failure, Coronary Artery Disease, Failure, Coronary Artery Disease, Hypertension, Peripheral Venous Hypertension, Peripheral Venous Disease, Type II Diabetes, History Disease, Type II Diabetes, History of Burn, Osteoarthritis, Neuropathy of Burn, Osteoarthritis, Neuropathy Date Acquired: 08/31/2019 09/01/2019 N/A Weeks of Treatment: 9 9 N/A Wound Status: Open Open N/A Measurements L x W x D (cm) 1.2x1.5x0.4 0.2x0.2x0.1 N/A Area (cm) : 1.414 0.031 N/A Volume (cm) : 0.565 0.003 N/A % Reduction in Area: 57.10% 95.50% N/A % Reduction in Volume: -71.20% 95.70% N/A Classification: Full Thickness Without  Exposed Full Thickness Without Exposed N/A Support Structures Support Structures Exudate Amount: Medium None Present N/A Exudate Type: Serous N/A N/A Exudate Color: amber N/A N/A Wound Margin: Flat and Intact Flat and Intact N/A Granulation Amount: Small (1-33%) None Present (0%) N/A Granulation Quality: Pink N/A N/A Necrotic Amount: Large (67-100%) Large (67-100%) N/A Exposed Structures: Fat Layer (Subcutaneous Tissue) Fat Layer (Subcutaneous Tissue) N/A Exposed: Yes Exposed: Yes Fascia: No Fascia: No Tendon: No Tendon: No Muscle: No Muscle: No Joint: No Joint: No Bone: No Bone: No Epithelialization: None None N/A Treatment Notes Electronic Signature(s) Signed: 11/04/2019 11:13:05 AM By: [name] Entered By: [name] on 11/04/2019 09:41:35 [name] [name] ([id number]) [name] [name] ([id number]) -------------------------------------------------------------------------------- [place] Details Patient Name: [name] [name] Date of Service: 11/04/2019 9:15 AM Medical Record Number: [id number] Patient Account Number: [id number] Date of Birth/Sex: Feb 14, 1961 (59 y.o. M) Treating RN: [name] Primary Care Fleda Pagel: [name] Other Clinician: Referring Lizanne Erker: [name] Treating Juan Kissoon/Extender: [name],  [name]: 15 Active Inactive Orientation to the Wound Care Program Nursing Diagnoses: Knowledge deficit related to the wound healing center program Goals: Patient/caregiver will verbalize understanding of the [place] Program Date Initiated: 07/22/2019 Target Resolution Date: 08/06/2019 Goal Status: Active Interventions: Provide education on orientation to the wound center Notes: Wound/Skin Impairment Nursing Diagnoses: Impaired tissue integrity Goals: Ulcer/skin breakdown will have a volume reduction of 30% by week 4 Date Initiated: 07/22/2019 Target Resolution Date: 08/17/2019 Goal Status: Active Interventions: Assess ulceration(s) every visit Notes: Electronic Signature(s) Signed: 11/04/2019 11:13:05 AM By: [name] Entered By: [name] on 11/04/2019 09:41:27 [name] [name] ([id number]) -------------------------------------------------------------------------------- Pain Assessment Details Patient Name: [name] [name] Date of Service: 11/04/2019 9:15 AM Medical Record Number: [id number] Patient Account Number: [id number] Date of Birth/Sex: [birth year] (59 y.o. M) Treating RN: Cornell Barman Primary Care Mariana Wiederholt: [name] Other Clinician: Referring Nasiyah Laverdiere: [name] Treating Wildon Cuevas/Extender: [name], [name]: 15 Active Problems Location of Pain Severity and Description of Pain Patient Has Paino No Site Locations Pain Management and Medication Current Pain Management: Electronic Signature(s) Signed: 11/05/2019 12:58:54 PM By: Gretta Cool, BSN, RN, CWS, Kim RN, BSN Entered By: Gretta Cool, BSN, RN, CWS, Kim on 11/04/2019 09:28:11 [name] [name] ([id number]) -------------------------------------------------------------------------------- Patient/Caregiver Education Details Patient Name: [name] [name] Date of Service: 11/04/2019 9:15 AM Medical Record Number: [id number] Patient Account Number: [id number] Date of  Birth/Gender: 05-Apr-1961 (58 y.o. M) Treating RN: [name] Primary Care Physician: [name] Other Clinician: Referring Physician: [name] Treating Physician/Extender: Sharalyn Ink in Treatment: 15 Education Assessment Education Provided To: Patient Education Topics Provided Wound/Skin Impairment: Handouts: Caring for Your Ulcer Methods: Demonstration, Explain/Verbal Responses: State content correctly Electronic Signature(s) Signed: 11/04/2019 11:13:05 AM By: [name] Entered By: [name] on 11/04/2019 09:44:07 [name] [name] ([id number]) -------------------------------------------------------------------------------- Wound Assessment Details Patient Name: [name] [name] Date of Service: 11/04/2019 9:15 AM Medical Record Number: [id number] Patient Account Number: [id number] Date of Birth/Sex: [birth year] (58 y.o. M) Treating RN: Cornell Barman Primary Care Marlean Mortell: [name] Other Clinician: Referring Maysa Lynn: [name] Treating Benay Pomeroy/Extender: [name], [name]: 15 Wound Status Wound Number: 3 Primary Venous Leg Ulcer Etiology: Wound Location: Right, Distal, Lateral Lower Leg Wound Open Wounding Event: Gradually Appeared Status: Date Acquired: 08/31/2019 Comorbid Sleep Apnea, Congestive Heart Failure, Coronary Artery Weeks Of Treatment: 9 History: Disease, Hypertension, Peripheral Venous Disease, Type II Clustered Wound: No Diabetes, History of Burn, Osteoarthritis, Neuropathy Photos Wound Measurements Length: (cm) 1.2 Width: (cm) 1.5 Depth: (cm) 0.4 Area: (cm)  1.414 Volume: (cm) 0.565 % Reduction in Area: 57.1% % Reduction in Volume: -71.2% Epithelialization: None Tunneling: No Undermining: No Wound Description Classification: Full Thickness Without Exposed Support Structu Wound Margin: Flat and Intact Exudate Amount: Medium Exudate Type: Serous Exudate Color: amber res Foul Odor After Cleansing:  No Slough/Fibrino Yes Wound Bed Granulation Amount: Small (1-33%) Exposed Structure Granulation Quality: Pink Fascia Exposed: No Necrotic Amount: Large (67-100%) Fat Layer (Subcutaneous Tissue) Exposed: Yes Necrotic Quality: Adherent Slough Tendon Exposed: No Muscle Exposed: No Joint Exposed: No Bone Exposed: No Treatment Notes Wound #3 (Right, Distal, Lateral Lower Leg) Notes prisma, BFD Electronic Signature(s) DAQUANE, AGUILAR ([id number]) Signed: 11/05/2019 12:58:54 PM By: Gretta Cool, BSN, RN, CWS, Kim RN, BSN Entered By: Gretta Cool, BSN, RN, CWS, Kim on 11/04/2019 09:32:43 [name] [name] ([id number]) -------------------------------------------------------------------------------- Wound Assessment Details Patient Name: [name] [name] Date of Service: 11/04/2019 9:15 AM Medical Record Number: [id number] Patient Account Number: [id number] Date of Birth/Sex: 01-09-1961 (58 y.o. M) Treating RN: Cornell Barman Primary Care Stefania Goulart: [name] Other Clinician: Referring Marvelle Span: [name] Treating Tammye Kahler/Extender: [name], [name]: 15 Wound Status Wound Number: 4 Primary Venous Leg Ulcer Etiology: Wound Location: Right, Proximal, Lateral Lower Leg Wound Open Wounding Event: Gradually Appeared Status: Date Acquired: 09/01/2019 Comorbid Sleep Apnea, Congestive Heart Failure, Coronary Artery Weeks Of Treatment: 9 History: Disease, Hypertension, Peripheral Venous Disease, Type II Clustered Wound: No Diabetes, History of Burn, Osteoarthritis, Neuropathy Photos Wound Measurements Length: (cm) 0.2 Width: (cm) 0.2 Depth: (cm) 0.1 Area: (cm) 0.031 Volume: (cm) 0.003 % Reduction in Area: 95.5% % Reduction in Volume: 95.7% Epithelialization: None Tunneling: No Undermining: No Wound Description Classification: Full Thickness Without Exposed Support Structure Wound Margin: Flat and Intact Exudate Amount: None Present s Foul Odor After Cleansing:  No Slough/Fibrino Yes Wound Bed Granulation Amount: None Present (0%) Exposed Structure Necrotic Amount: Large (67-100%) Fascia Exposed: No Necrotic Quality: Adherent Slough Fat Layer (Subcutaneous Tissue) Exposed: Yes Tendon Exposed: No Muscle Exposed: No Joint Exposed: No Bone Exposed: No Treatment Notes Wound #4 (Right, Proximal, Lateral Lower Leg) Notes prisma, BFD Electronic Signature(s) Signed: 11/05/2019 12:58:54 PM By: Gretta Cool, BSN, RN, CWS, Kim RN, BSN Entered By: Gretta Cool, BSN, RN, CWS, Kim on 11/04/2019 09:33:22 [name] [name] ([id number]) [name] [name] ([id number]) -------------------------------------------------------------------------------- Vitals Details Patient Name: [name] [name] Date of Service: 11/04/2019 9:15 AM Medical Record Number: [id number] Patient Account Number: [id number] Date of Birth/Sex: [birth year] (58 y.o. M) Treating RN: Cornell Barman Primary Care Aimi Essner: [name] Other Clinician: Referring Aster Screws: [name] Treating Jaylan Hinojosa/Extender: [name], [name]: 15 Vital Signs Time Taken: 09:27 Temperature (F): 98.2 Height (in): 74 Pulse (bpm): 67 Weight (lbs): 221 Respiratory Rate (breaths/min): 16 Body Mass Index (BMI): 28.4 Blood Pressure (mmHg): 142/62 Reference Range: 80 - 120 mg / dl Electronic Signature(s) Signed: 11/05/2019 12:58:54 PM By: Gretta Cool, BSN, RN, CWS, Kim RN, BSN Entered By: Gretta Cool, BSN, RN, CWS, Kim on 11/04/2019 09:27:52

## 2019-11-04 NOTE — Progress Notes (Addendum)
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 He seems to have some kind of volume or fluid overload and potentially this could be affecting his lungs and breathing as well especially when we squeeze extra fluid out of the leg into his system. Nonetheless I think that he does not need to use a wrap nor the lymphedema pumps anymore at least until he can figure out what exactly is going on with his physicians to get something under control. He sees his kidney doctor on Monday and his primary care provider next week as well. I think he may also need to see his cardiologist. 11/04/2019 upon evaluation today patient appears to be doing slightly better in regard to his wounds. Fortunately there is no signs of active infection at this time. Due to his breathing issues he did have a sleep study last night he is very tired this morning. Otherwise he tells me his breathing has been slightly better since we discontinued using the pumps and the compression wraps. Objective Constitutional Well-nourished and well-hydrated in no acute distress. Vitals Time Taken: 9:27 AM, Height: 74 in, Weight: 221 lbs, BMI: 28.4, Temperature: 98.2 F, Pulse: 67 bpm, Respiratory Rate: 16 breaths/min, Blood Pressure: 142/62 mmHg. Respiratory normal breathing without  difficulty. Psychiatric this patient is able to make decisions and demonstrates good insight into disease process. Alert and Oriented x 3. pleasant and cooperative. General Notes: Upon inspection patient's wound bed actually showed signs of good granulation at this time there was also some good epithelial tissue he still has a lot of swelling although is not quite as bad as last week. Nonetheless I think this is still a significant issue in his lower  extremities preventing appropriate rapid healing. Integumentary (Hair, Skin) Wound #3 status is Open. Original cause of wound was Gradually Appeared. The wound is located on the Right,Distal,Lateral Lower Leg. The wound measures 1.2cm length x 1.5cm width x 0.4cm depth; 1.414cm^2 area and 0.565cm^3 volume. There is Fat Layer (Subcutaneous Tissue) Exposed exposed. There is no tunneling or undermining noted. There is a medium amount of serous drainage noted. The wound margin is flat and intact. There is small (1-33%) pink granulation within the wound bed. There is a large (67-100%) amount of necrotic tissue within the wound bed including Adherent Slough. Wound #4 status is Open. Original cause of wound was Gradually Appeared. The wound is located on the Right,Proximal,Lateral Lower Leg. The wound measures 0.2cm length x 0.2cm width x 0.1cm depth; 0.031cm^2 area and 0.003cm^3 volume. There is Fat Layer (Subcutaneous Tissue) Exposed exposed. There is no tunneling or undermining noted. There is a none present amount of drainage noted. The wound margin is flat and intact. There is no granulation within the wound bed. There is a large (67-100%) amount of necrotic tissue within the wound bed including Adherent Slough. 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 May shower with protection. Wound #4 Right,Proximal,Lateral Lower Leg: Clean wound with Normal Saline. May shower with protection. Anesthetic (add to Medication List): Wound #3 Right,Distal,Lateral Lower Leg: Topical Lidocaine 4% cream applied to wound bed prior to debridement (In Clinic Only). Wound #4 Right,Proximal,Lateral Lower Leg: Topical Lidocaine 4% cream applied to wound bed prior to debridement (In Clinic Only). Primary Wound Dressing: Wound #3 Right,Distal,Lateral Lower Leg: Silver Collagen Wound #4 Right,Proximal,Lateral Lower Leg: Silver Collagen Secondary Dressing: Wound #3 Right,Distal,Lateral Lower Leg: Boardered Foam Dressing Wound #4 Right,Proximal,Lateral Lower Leg: Boardered Foam Dressing Dressing Change Frequency: Wound #3 Right,Distal,Lateral Lower Leg: Change dressing every other day. Wound #4 Right,Proximal,Lateral Lower Leg: Change dressing every other day. Follow-up Appointments: Wound #3 Right,Distal,Lateral Lower Leg: Return Appointment in 1 week. Wound #4 Right,Proximal,Lateral Lower Leg: Return Appointment in 1 week. Edema Control: Wound #3 Right,Distal,Lateral Lower Leg: Elevate legs to the level of the heart and pump ankles as often as possible Wound #4 Right,Proximal,Lateral Lower Leg: Elevate legs to the level of the heart and pump ankles as often as possible 1. I would recommend currently that we go ahead and continue with the wound care measures as before. My suggestion at this time is good to be that we continue with the silver collagen. 2.  I would also recommend that we continue to monitor for any signs of infection I do not see anything right now. We will keep a close eye on this however. 3. I am going to suggest the patient continue to try to elevate his legs is much as possible this is really the only thing he can do to help with edema control right now. We will see patient back for reevaluation in 1 week here in the clinic. If anything worsens or changes patient will contact our office for additional recommendations. Electronic Signature(s) Signed: 11/04/2019 9:57:10 AM By: Joaquim Lai  III, Nija Koopman PA-C Entered By: Worthy Keeler on 11/04/2019 09:57:10 [name] [name] ([id number]) -------------------------------------------------------------------------------- SuperBill Details Patient Name: [name] [name] Date of Service: 11/04/2019 Medical Record Number: [id number] Patient Account Number: [id number] Date of Birth/Sex: 12-Dec-1960 (58 y.o. M) Treating RN: [name] Primary Care Provider: [name] Other Clinician: Referring Provider: [name] Treating Provider/Extender: [name], Luverta Korte Weeks in Treatment: 15 Diagnosis Coding ICD-10 Codes Code Description E11.621 Type 2 diabetes mellitus with foot ulcer L97.512 Non-pressure chronic ulcer of other part of right foot with fat layer exposed I50.42 Chronic combined systolic (congestive) and diastolic (congestive) heart failure I10 Essential (primary) hypertension I25.10 Atherosclerotic heart disease of native coronary artery without angina pectoris N18.30 Chronic kidney disease, stage 3 unspecified L84 Corns and callosities L97.811 Non-pressure chronic ulcer of other part of right lower leg limited to breakdown of skin Facility Procedures CPT4 Code: [id number] Description: 99213 - WOUND CARE VISIT-LEV 3 EST PT Modifier: Quantity: 1 Physician Procedures CPT4 Code: [id number] Description: 99213 - WC PHYS LEVEL 3 - EST PT Modifier: Quantity: 1 CPT4 Code: Description:  ICD-10 Diagnosis Description E11.621 Type 2 diabetes mellitus with foot ulcer L97.512 Non-pressure chronic ulcer of other part of right foot with fat layer ex I50.42 Chronic combined systolic (congestive) and diastolic (congestive) heart I10  Essential (primary) hypertension Modifier: posed failure Quantity: Electronic Signature(s) Signed: 11/04/2019 9:57:24 AM By: Worthy Keeler PA-C Entered By: Worthy Keeler on 11/04/2019 09:57:24

## 2019-11-04 NOTE — Telephone Encounter (Signed)
*  STAT* If patient is at the pharmacy, call can be transferred to refill team.   1. Which medications need to be refilled? (please list name of each medication and dose if known) Praulent 75 mg every 2 weeks  2. Which pharmacy/location (including street and city if local pharmacy) is medication to be sent to? [facility] in [place]  3. Do they need a 30 day or 90 day supply? 30 monthly  Pharmacy made pt aware this needs a prior [place]

## 2019-11-05 ENCOUNTER — Ambulatory Visit
Admission: RE | Admit: 2019-11-05 | Discharge: 2019-11-05 | Disposition: A | Discharge status: Home / Self Care | Payer: Managed Care, Other (non HMO) | Source: Ambulatory Visit | Attending: Adult Health | Admitting: Adult Health

## 2019-11-05 DIAGNOSIS — R0602 Shortness of breath: Secondary | ICD-10-CM | POA: Insufficient documentation

## 2019-11-08 ENCOUNTER — Ambulatory Visit: Payer: Managed Care, Other (non HMO) | Admitting: Internal Medicine

## 2019-11-08 ENCOUNTER — Encounter: Payer: Self-pay | Admitting: Internal Medicine

## 2019-11-08 ENCOUNTER — Ambulatory Visit
Admission: RE | Admit: 2019-11-08 | Discharge: 2019-11-08 | Disposition: A | Discharge status: Home / Self Care | Payer: Managed Care, Other (non HMO) | Source: Ambulatory Visit | Attending: Nephrology | Admitting: Nephrology

## 2019-11-08 ENCOUNTER — Other Ambulatory Visit: Payer: Self-pay

## 2019-11-08 VITALS — BP 147/68 | HR 70 | Temp 97.8°F | Resp 16 | Ht 74.0 in | Wt 224.0 lb

## 2019-11-08 DIAGNOSIS — I5032 Chronic diastolic (congestive) heart failure: Secondary | ICD-10-CM

## 2019-11-08 DIAGNOSIS — N1832 Chronic kidney disease, stage 3b: Secondary | ICD-10-CM | POA: Insufficient documentation

## 2019-11-08 DIAGNOSIS — G4733 Obstructive sleep apnea (adult) (pediatric): Secondary | ICD-10-CM

## 2019-11-08 DIAGNOSIS — I10 Essential (primary) hypertension: Secondary | ICD-10-CM

## 2019-11-08 NOTE — Progress Notes (Signed)
[facility] [place], [place] [address]  Pulmonary Sleep Medicine   Office Visit Note  Patient Name: [name] [name] DOB: Jun 13, 1960 MRN [id number]  Date of Service: 11/08/2019  Complaints/HPI: Pt is here for follow up on CPAP titration study. His titration study showed an overall decreased AHI with cpap, however the study did not go beyond a pressure of 10.  He continues to have a significant ahi at 33 with this.  He will need an auto-titrating machine at this time with close follow up to eval effectiveness.     ROS  General: (-) fever, (-) chills, (-) night sweats, (-) weakness Skin: (-) rashes, (-) itching,. Eyes: (-) visual changes, (-) redness, (-) itching. Nose and Sinuses: (-) nasal stuffiness or itchiness, (-) postnasal drip, (-) nosebleeds, (-) sinus trouble. Mouth and Throat: (-) sore throat, (-) hoarseness. Neck: (-) swollen glands, (-) enlarged thyroid, (-) neck pain. Respiratory: - cough, (-) bloody sputum, - shortness of breath, - wheezing. Cardiovascular: - ankle swelling, (-) chest pain. Lymphatic: (-) lymph node enlargement. Neurologic: (-) numbness, (-) tingling. Psychiatric: (-) anxiety, (-) depression   Current Medication: Outpatient Encounter Medications as of 11/08/2019  Medication Sig  . albuterol (VENTOLIN HFA) 108 (90 Base) MCG/ACT inhaler Inhale 2 puffs into the lungs every 6 (six) hours as needed for wheezing or shortness of breath.  . Alirocumab (PRALUENT) 75 MG/ML SOAJ Inject 75 mg into the skin every 14 (fourteen) days.  . carvedilol (COREG) 12.5 MG tablet Take 12.5 mg by mouth 2 (two) times daily with a meal.  . ezetimibe (ZETIA) 10 MG tablet Take 10 mg by mouth daily.  . fluticasone furoate-vilanterol (BREO ELLIPTA) 100-25 MCG/INH AEPB Inhale 1 puff into the lungs daily.  . hydrALAZINE (APRESOLINE) 50 MG tablet Take 1 tablet (50 mg total) by mouth 3 (three) times daily.  . insulin degludec (TRESIBA FLEXTOUCH) 100 UNIT/ML  SOPN FlexTouch Pen Inject 140 Units into the skin daily.  . insulin lispro (HUMALOG KWIKPEN) 100 UNIT/ML KwikPen Inject into the skin. 0-20 units per sliding scale  . metolazone (ZAROXOLYN) 5 MG tablet Take 5 mg by mouth 2 (two) times a week.  . pantoprazole (PROTONIX) 40 MG tablet Take 40 mg by mouth daily.  Marland Kitchen torsemide (DEMADEX) 20 MG tablet Take 2 tablets (40 mg total) by mouth 2 (two) times daily. Take 2 tablets (40 mg) daily with 2 tablets (40 mg) in the afternoon as needed   No facility-administered encounter medications on file as of 11/08/2019.    Surgical History: Past Surgical History:  Procedure Laterality Date  . bone graft surgery    . CARDIAC CATHETERIZATION  2013   S/p PCI   . CARDIAC CATHETERIZATION  2018   S/p CABG  . CARDIAC SURGERY    . CORONARY ARTERY BYPASS GRAFT  2018   (LIMA-LAD,VG-RCA,VG-OM1,VG-D1)  . EYE SURGERY    . PERIPHERAL VASCULAR CATHETERIZATION Right 10/28/2016   PTA/DEB Right SFA  . WRIST SURGERY      Medical History: Past Medical History:  Diagnosis Date  . Arrhythmia   . Arthritis   . CAD (coronary artery disease)   . Cardiomyopathy, ischemic   . CHF (congestive heart failure) (HCC)    NYHA II-III  . Colon polyps   . Diabetes ([place])   . Dupre's syndrome   . GERD (gastroesophageal reflux disease)   . Hyperlipidemia   . Hypertension   . Migraine   . Neuropathy   . Peripheral vascular disease ([place])   .  Retinopathy   . Sleep apnea   . Stage 3 chronic kidney disease   . Stomach ulcer     Family History: Family History  Problem Relation Age of Onset  . Diabetes Mother   . Heart disease Father     Social History: Social History   Socioeconomic History  . Marital status: Married    Spouse name: Not on file  . Number of children: Not on file  . Years of education: Not on file  . Highest education level: Not on file  Occupational History  . Not on file  Tobacco Use  . Smoking status: Former Smoker    Types: Cigarettes     Quit date: 05/18/2012    Years since quitting: 7.4  . Smokeless tobacco: Never Used  Vaping Use  . Vaping Use: Never used  Substance and Sexual Activity  . Alcohol use: Yes    Comment: very rarely  . Drug use: Never  . Sexual activity: Not on file  Other Topics Concern  . Not on file  Social History Narrative  . Not on file   Social Determinants of Health   Financial Resource Strain:   . Difficulty of Paying Living Expenses:   Food Insecurity:   . Worried About Charity fundraiser in the Last Year:   . Arboriculturist in the Last Year:   Transportation Needs:   . Film/video editor (Medical):   Marland Kitchen Lack of Transportation (Non-Medical):   Physical Activity:   . Days of Exercise per Week:   . Minutes of Exercise per Session:   Stress:   . Feeling of Stress :   Social Connections:   . Frequency of Communication with Friends and Family:   . Frequency of Social Gatherings with Friends and Family:   . Attends Religious Services:   . Active Member of Clubs or Organizations:   . Attends Archivist Meetings:   Marland Kitchen Marital Status:   Intimate Partner Violence:   . Fear of Current or Ex-Partner:   . Emotionally Abused:   Marland Kitchen Physically Abused:   . Sexually Abused:     Vital Signs: Blood pressure (!) 147/68, pulse 70, temperature 97.8 F (36.6 C), resp. rate 16, height 6' 2" (1.88 m), weight 224 lb (101.6 kg), SpO2 95 %.  Examination: General Appearance: The patient is well-developed, well-nourished, and in no distress. Skin: Gross inspection of skin unremarkable. Head: normocephalic, no gross deformities. Eyes: no gross deformities noted. ENT: ears appear grossly normal no exudates. Neck: Supple. No thyromegaly. No LAD. Respiratory: clear bilaterally. Cardiovascular: Normal S1 and S2 without murmur or rub. Extremities: No cyanosis. pulses are equal. Neurologic: Alert and oriented. No involuntary movements.  LABS: Recent Results (from the past 2160 hour(s))   Basic metabolic panel     Status: Abnormal   Collection Time: 08/17/19 10:26 AM  Result Value Ref Range   Sodium 135 135 - 145 mmol/L   Potassium 4.6 3.5 - 5.1 mmol/L   Chloride 107 98 - 111 mmol/L   CO2 22 22 - 32 mmol/L   Glucose, Bld 366 (H) 70 - 99 mg/dL    Comment: Glucose reference range applies only to samples taken after fasting for at least 8 hours.   BUN 43 (H) 6 - 20 mg/dL   Creatinine, Ser 2.13 (H) 0.61 - 1.24 mg/dL   Calcium 8.3 (L) 8.9 - 10.3 mg/dL   GFR calc non Af Amer 33 (L) >60 mL/min   GFR  calc Af Amer 38 (L) >60 mL/min   Anion gap 6 5 - 15    Comment: Performed at [facility], [address] [place], [place] [address]  Surgical pathology     Status: None   Collection Time: 09/02/19 12:00 AM  Result Value Ref Range   SURGICAL PATHOLOGY      Surgical Pathology CASE: [id number] PATIENT: [name] Surgical Pathology Report     Specimen Submitted: A. Skin, right leg; biopsy  Clinical History: Unknown blister.  Patient has blisters on foot and leg that randomly form and take long periods to heal.     DIAGNOSIS: A. SKIN, RIGHT LEG; PUNCH BIOPSY: - SUBEPIDERMAL CLEFT OVERLYING DERMAL SCAR-LIKE FIBROSIS, WITH MINIMAL INFLAMMATION, SEE COMMENT.  Comment: The epidermis is intact, without acanthosis, necrosis, or acantholysis. There is dense dermal fibrosis with a few foci of sparse perivascular lymphocytes. I do not identify any neutrophilic infiltrate, eosinophilic infiltrate, or vasculitis.  Correlation with the history and clinical appearance is needed. The histology suggests a frictional blister involving a scar. Dermatologic consultation may be helpful to determine if there is the possibility of an autoimmune vesicobullous disease, for which direct immunofluorescence could be considered .  GROSS DESCRIPTION: A. Labeled: Right leg Received: Formalin Tissue fragment(s): 1 Size: 0.5 cm in diameter excised to a depth of 0.3  cm Description: Received is a slightly disrupted skin punch biopsy.  The outermost layer of the skin is received disrupted and loosely attached. The surgical resection margin is inked blue.  Submitted intact. Entirely submitted in cassette 1.     Final Diagnosis performed by Bryan Lemma, MD.   Electronically signed 09/06/2019 5:36:32PM The electronic signature indicates that the named Attending Pathologist has evaluated the specimen Technical component performed at [facility], [address], [place], [place] [address] Lab: [phone] Dir: Rush Farmer, MD, MMM  Professional component performed at [facility], [place], [place], [place] [address] Lab: [phone] Dir: Dellia Nims. Rubinas, MD   Basic metabolic panel     Status: Abnormal   Collection Time: 09/03/19 10:57 AM  Result Value Ref Range   Sodium 132 (L) 135 - 145 mmol/L   Potassium 4.2 3.5 - 5.1 mmol/L   Chloride 101 98 - 111 mmol/L   CO2 22 22 - 32 mmol/L   Glucose, Bld 355 (H) 70 - 99 mg/dL    Comment: Glucose reference range applies only to samples taken after fasting for at least 8 hours.   BUN 50 (H) 6 - 20 mg/dL   Creatinine, Ser 2.42 (H) 0.61 - 1.24 mg/dL   Calcium 8.8 (L) 8.9 - 10.3 mg/dL   GFR calc non Af Amer 28 (L) >60 mL/min   GFR calc Af Amer 32 (L) >60 mL/min   Anion gap 9 5 - 15    Comment: Performed at [facility], [address], [place], [place] [address]  Pulmonary Function Test     Status: None   Collection Time: 09/22/19 11:30 AM  Result Value Ref Range   FEV1     FVC     FEV1/FVC     TLC     DLCO    Aerobic Culture (superficial specimen)     Status: None   Collection Time: 09/23/19  5:21 PM   Specimen: Leg  Result Value Ref Range   Specimen Description      LEG Performed at [facility], [address], [place], [place] [address]    Special Requests  NONE Performed at [facility], [place], [place]  [address]    Gram Stain      NO WBC SEEN MODERATE GRAM POSITIVE COCCI Performed at [facility], [place] [address], [place], [place] [address]    Culture FEW ENTEROCOCCUS FAECALIS    Report Status 09/25/2019 FINAL    Organism ID, Bacteria ENTEROCOCCUS FAECALIS       Susceptibility   Enterococcus faecalis - MIC*    AMPICILLIN <=2 SENSITIVE Sensitive     VANCOMYCIN 2 SENSITIVE Sensitive     GENTAMICIN SYNERGY SENSITIVE Sensitive     * FEW ENTEROCOCCUS FAECALIS  Comp Met (CMET)     Status: Abnormal   Collection Time: 10/01/19  9:30 AM  Result Value Ref Range   Glucose 590 (HH) 65 - 99 mg/dL    Comment: **Verified by repeat analysis**   BUN 44 (H) 8 - 27 mg/dL   Creatinine, Ser 2.17 (H) 0.76 - 1.27 mg/dL   GFR calc non Af Amer 32 (L) >59 mL/min/1.73   GFR calc Af Amer 37 (L) >59 mL/min/1.73    Comment: **[facility] currently reports eGFR in compliance with the current**   recommendations of the [employer]. [facility] will   update reporting as new guidelines are published from the NKF-ASN   Task force.    BUN/Creatinine Ratio 20 10 - 24   Sodium 129 (L) 134 - 144 mmol/L   Potassium 4.7 3.5 - 5.2 mmol/L   Chloride 95 (L) 96 - 106 mmol/L   CO2 18 (L) 20 - 29 mmol/L   Calcium 9.4 8.6 - 10.2 mg/dL   Total Protein 6.5 6.0 - 8.5 g/dL   Albumin 3.8 3.8 - 4.9 g/dL   Globulin, Total 2.7 1.5 - 4.5 g/dL   Albumin/Globulin Ratio 1.4 1.2 - 2.2   Bilirubin Total 0.3 0.0 - 1.2 mg/dL   Alkaline Phosphatase 145 (H) 39 - 117 IU/L   AST 24 0 - 40 IU/L   ALT 25 0 - 44 IU/L  POCT CBG (Fasting - Glucose)     Status: Abnormal   Collection Time: 11/01/19  8:56 AM  Result Value Ref Range   Glucose Fasting, POC 225 (A) 70 - 99 mg/dL    Comment: not fasting   POCT HgB A1C     Status: Abnormal   Collection Time: 11/01/19  9:25 AM  Result Value Ref Range   Hemoglobin A1C 12.2 (A) 4.0 - 5.6 %   HbA1c POC (<> result, manual entry)     HbA1c, POC (prediabetic range)     HbA1c, POC  (controlled diabetic range)      Radiology: CT CHEST WO CONTRAST  Result Date: 11/06/2019 CLINICAL DATA:  Shortness of breath. Shortness of breath for 2 years, worse over the past 6 months. EXAM: CT CHEST WITHOUT CONTRAST TECHNIQUE: Multidetector CT imaging of the chest was performed following the standard protocol without IV contrast. COMPARISON:  Radiograph 08/24/2019 FINDINGS: Cardiovascular: Post CABG with atherosclerosis of the thoracic aorta. No aortic aneurysm. Mild cardiomegaly with calcification of native coronary arteries. No pericardial effusion. Mediastinum/Nodes: Shotty mediastinal adenopathy including a lower paratracheal node measuring 11 mm, series 2 image 61. There multiple prominent mildly enlarged upper mediastinal and prevascular nodes. Limited assessment for hilar adenopathy in the absence of IV contrast. Thyroid gland is heterogeneous with multiple small nodules. Largest nodule in the left lobe measures approximately 16 mm. One of the nodules on the right is calcified. Esophagus slightly patulous  without wall thickening. Lungs/Pleura: There are small bilateral pleural effusions. Mild apical predominant emphysema. Linear bandlike opacity at the right lung apex suggestive of scarring. There is mild diffuse smooth septal thickening suggesting mild pulmonary edema. No pulmonary mass or suspicious nodule. Trachea and mainstem bronchi are patent. Upper Abdomen: No acute or unexpected findings. Musculoskeletal: Median sternotomy. There are no acute or suspicious osseous abnormalities. IMPRESSION: 1. Small bilateral pleural effusions and mild smooth septal thickening suggesting mild pulmonary edema. Findings suggest mild fluid overload. 2. Shotty mediastinal adenopathy is likely reactive. 3. Mild emphysema. Linear bandlike opacity at the right lung apex suggestive of scarring. 4. Post CABG with mild cardiomegaly. 5. Multinodular thyroid gland, largest nodule in the left lobe measures 17 mm.  Recommend thyroid US (ref: J Am Coll Radiol. 2015 Feb;12(2): 143-50). Aortic Atherosclerosis (ICD10-I70.0) and Emphysema (ICD10-J43.9). Electronically Signed   By: Keith Rake M.D.   On: 11/06/2019 22:14    CT CHEST WO CONTRAST  Result Date: 11/06/2019 CLINICAL DATA:  Shortness of breath. Shortness of breath for 2 years, worse over the past 6 months. EXAM: CT CHEST WITHOUT CONTRAST TECHNIQUE: Multidetector CT imaging of the chest was performed following the standard protocol without IV contrast. COMPARISON:  Radiograph 08/24/2019 FINDINGS: Cardiovascular: Post CABG with atherosclerosis of the thoracic aorta. No aortic aneurysm. Mild cardiomegaly with calcification of native coronary arteries. No pericardial effusion. Mediastinum/Nodes: Shotty mediastinal adenopathy including a lower paratracheal node measuring 11 mm, series 2 image 61. There multiple prominent mildly enlarged upper mediastinal and prevascular nodes. Limited assessment for hilar adenopathy in the absence of IV contrast. Thyroid gland is heterogeneous with multiple small nodules. Largest nodule in the left lobe measures approximately 16 mm. One of the nodules on the right is calcified. Esophagus slightly patulous without wall thickening. Lungs/Pleura: There are small bilateral pleural effusions. Mild apical predominant emphysema. Linear bandlike opacity at the right lung apex suggestive of scarring. There is mild diffuse smooth septal thickening suggesting mild pulmonary edema. No pulmonary mass or suspicious nodule. Trachea and mainstem bronchi are patent. Upper Abdomen: No acute or unexpected findings. Musculoskeletal: Median sternotomy. There are no acute or suspicious osseous abnormalities. IMPRESSION: 1. Small bilateral pleural effusions and mild smooth septal thickening suggesting mild pulmonary edema. Findings suggest mild fluid overload. 2. Shotty mediastinal adenopathy is likely reactive. 3. Mild emphysema. Linear bandlike opacity  at the right lung apex suggestive of scarring. 4. Post CABG with mild cardiomegaly. 5. Multinodular thyroid gland, largest nodule in the left lobe measures 17 mm. Recommend thyroid US (ref: J Am Coll Radiol. 2015 Feb;12(2): 143-50). Aortic Atherosclerosis (ICD10-I70.0) and Emphysema (ICD10-J43.9). Electronically Signed   By: Keith Rake M.D.   On: 11/06/2019 22:14    CT CHEST WO CONTRAST  Result Date: 11/06/2019 CLINICAL DATA:  Shortness of breath. Shortness of breath for 2 years, worse over the past 6 months. EXAM: CT CHEST WITHOUT CONTRAST TECHNIQUE: Multidetector CT imaging of the chest was performed following the standard protocol without IV contrast. COMPARISON:  Radiograph 08/24/2019 FINDINGS: Cardiovascular: Post CABG with atherosclerosis of the thoracic aorta. No aortic aneurysm. Mild cardiomegaly with calcification of native coronary arteries. No pericardial effusion. Mediastinum/Nodes: Shotty mediastinal adenopathy including a lower paratracheal node measuring 11 mm, series 2 image 61. There multiple prominent mildly enlarged upper mediastinal and prevascular nodes. Limited assessment for hilar adenopathy in the absence of IV contrast. Thyroid gland is heterogeneous with multiple small nodules. Largest nodule in the left lobe measures approximately 16 mm. One of the nodules on the  right is calcified. Esophagus slightly patulous without wall thickening. Lungs/Pleura: There are small bilateral pleural effusions. Mild apical predominant emphysema. Linear bandlike opacity at the right lung apex suggestive of scarring. There is mild diffuse smooth septal thickening suggesting mild pulmonary edema. No pulmonary mass or suspicious nodule. Trachea and mainstem bronchi are patent. Upper Abdomen: No acute or unexpected findings. Musculoskeletal: Median sternotomy. There are no acute or suspicious osseous abnormalities. IMPRESSION: 1. Small bilateral pleural effusions and mild smooth septal thickening  suggesting mild pulmonary edema. Findings suggest mild fluid overload. 2. Shotty mediastinal adenopathy is likely reactive. 3. Mild emphysema. Linear bandlike opacity at the right lung apex suggestive of scarring. 4. Post CABG with mild cardiomegaly. 5. Multinodular thyroid gland, largest nodule in the left lobe measures 17 mm. Recommend thyroid US (ref: J Am Coll Radiol. 2015 Feb;12(2): 143-50). Aortic Atherosclerosis (ICD10-I70.0) and Emphysema (ICD10-J43.9). Electronically Signed   By: Keith Rake M.D.   On: 11/06/2019 22:14   DG Sniff Test  Result Date: 10/18/2019 CLINICAL DATA:  Elevated right hemidiaphragm with increasing shortness of breath and peripheral swelling EXAM: CHEST FLUOROSCOPY TECHNIQUE: Real-time fluoroscopic evaluation of the chest was performed. FLUOROSCOPY TIME:  Fluoroscopy Time:  24 seconds Radiation Exposure Index (if provided by the fluoroscopic device): 4.5 mGy Number of Acquired Spot Images: Multiple cine fluoroscopic runs COMPARISON:  08/24/2019 FINDINGS: Initial evaluation again demonstrates elevation of the right hemidiaphragm compared to the left. Full inspiration demonstrates normal diaphragmatic motion bilaterally. Sniff test shows no paradoxical motion. IMPRESSION: Elevation of the right hemidiaphragm without evidence of paradoxical motion. No other focal abnormality is noted. Electronically Signed   By: Inez Catalina M.D.   On: 10/18/2019 09:16      Assessment and Plan: Patient Active Problem List   Diagnosis Date Noted  . PAD (peripheral artery disease) ([place]) 09/27/2019  . Diabetes ([place]) 09/14/2019  . CKD (chronic kidney disease) stage 3, GFR 30-59 ml/min 09/14/2019  . Swelling of limb 09/14/2019  . Lymphedema 09/14/2019  . Chronic obstructive pulmonary disease ([place]) 09/05/2019  . Shortness of breath 09/05/2019  . Essential hypertension 09/05/2019  . Coronary atherosclerosis 11/15/2016  . Heel ulcer due to DM ([place]) 12/09/2012  . Septic olecranon bursitis  10/14/2011  . Cellulitis 10/06/2011  . Depression with anxiety 02/22/2011  . Diabetic peripheral neuropathy associated with type 1 diabetes mellitus ([place]) 02/22/2011  . Migraines 02/22/2011  . Hyperlipidemia, mixed 02/22/2011  . Sleep apnea 11/22/2010  . 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 OSA (obstructive sleep apnea) Auto-titrating machine ordered for patient.  - For home use only DME continuous positive airway pressure (CPAP)  2. Chronic diastolic heart failure [facility]) Continue cardiology recommendations. Follow up as discussed.   3. Essential hypertension Continue present management.   General Counseling: I have discussed the findings of the evaluation and examination with Nehemiah Massed.  I have also discussed any further diagnostic evaluation thatmay be needed or ordered today. Arvis verbalizes understanding of the findings of todays visit. We also reviewed his medications today and discussed drug interactions and side effects including but not limited excessive drowsiness and altered mental states. We also discussed that there is always a risk not just to him but also people around him. he has been encouraged to call the office with any questions or concerns that should arise related to todays visit.  No orders of the defined types were placed in this encounter.    Time spent: 25  I  have personally obtained a history, examined the patient, evaluated laboratory and imaging results, formulated the assessment and plan and placed orders.    Saadat A Khan, MD FCCP Pulmonary and Critical Care Sleep medicine 

## 2019-11-08 NOTE — Telephone Encounter (Signed)
[employer] Prior Authorization has been approved per fax from [facility] from 11/06/2019 through 11/05/2020. Pharmacy informed.

## 2019-11-11 ENCOUNTER — Encounter: Payer: Managed Care, Other (non HMO) | Admitting: Physician Assistant

## 2019-11-11 ENCOUNTER — Telehealth: Payer: Self-pay

## 2019-11-11 ENCOUNTER — Other Ambulatory Visit: Payer: Self-pay

## 2019-11-11 DIAGNOSIS — E11621 Type 2 diabetes mellitus with foot ulcer: Secondary | ICD-10-CM | POA: Diagnosis not present

## 2019-11-11 NOTE — Progress Notes (Addendum)
[name] [name], [name] [name] ([id number]) Visit Report for 11/11/2019 Arrival Information Details Patient Name: [name] [name], [name] [name] Date of Service: 11/11/2019 11:00 AM Medical Record Number: [id number] Patient Account Number: [id number] Date of Birth/Sex: [birth year] (59 y.o. M) Treating RN: [name] Primary Care Ewing Fandino: [name] Other Clinician: Referring Habiba Treloar: [name] Treating Tabor Denham/Extender: [name], [name]: 16 Visit Information History Since Last Visit Added or deleted any medications: No Patient Arrived: Cane Any new allergies or adverse reactions: No Arrival Time: 11:03 Had a fall or experienced change in No Accompanied By: self activities of daily living that may affect Transfer Assistance: None risk of falls: Patient Identification Verified: Yes Signs or symptoms of abuse/neglect since last visito No Secondary Verification Process Completed: Yes Hospitalized since last visit: No Implantable device outside of the clinic excluding No cellular tissue based products placed in the center since last visit: Has Dressing in Place as Prescribed: Yes Pain Present Now: No Electronic Signature(s) Signed: 11/11/2019 3:30:19 PM By: Lorine Bears RCP, RRT, CHT Entered By: Lorine Bears on 11/11/2019 11:04:18 [name] [name] ([id number]) -------------------------------------------------------------------------------- Clinic Level of Care Assessment Details Patient Name: [name] [name] Date of Service: 11/11/2019 11:00 AM Medical Record Number: [id number] Patient Account Number: [id number] Date of Birth/Sex: November 12, 1960 (58 y.o. M) Treating RN: [name] Primary Care Tykee Heideman: [name] Other Clinician: Referring Cecily Lawhorne: [name] Treating Tanairy Payeur/Extender: [name], [name]: 16 Clinic Level of Care Assessment Items TOOL 4 Quantity Score []  - Use when only an EandM is performed on FOLLOW-UP visit  0 ASSESSMENTS - Nursing Assessment / Reassessment X - Reassessment of Co-morbidities (includes updates in patient status) 1 10 X- 1 5 Reassessment of Adherence to Treatment Plan ASSESSMENTS - Wound and Skin Assessment / Reassessment []  - Simple Wound Assessment / Reassessment - one wound 0 X- 2 5 Complex Wound Assessment / Reassessment - multiple wounds []  - 0 Dermatologic / Skin Assessment (not related to wound area) ASSESSMENTS - Focused Assessment []  - Circumferential Edema Measurements - multi extremities 0 []  - 0 Nutritional Assessment / Counseling / Intervention []  - 0 Lower Extremity Assessment (monofilament, tuning fork, pulses) []  - 0 Peripheral Arterial Disease Assessment (using hand held doppler) ASSESSMENTS - Ostomy and/or Continence Assessment and Care []  - Incontinence Assessment and Management 0 []  - 0 Ostomy Care Assessment and Management (repouching, etc.) PROCESS - Coordination of Care X - Simple Patient / Family Education for ongoing care 1 15 []  - 0 Complex (extensive) Patient / Family Education for ongoing care []  - 0 Staff obtains Programmer, systems, Records, Test Results / Process Orders []  - 0 Staff telephones HHA, Nursing Homes / Clarify orders / etc []  - 0 Routine Transfer to another Facility (non-emergent condition) []  - 0 Routine Hospital Admission (non-emergent condition) []  - 0 New Admissions / Biomedical engineer / Ordering NPWT, Apligraf, etc. []  - 0 Emergency Hospital Admission (emergent condition) X- 1 10 Simple Discharge Coordination []  - 0 Complex (extensive) Discharge Coordination PROCESS - Special Needs []  - Pediatric / Minor Patient Management 0 []  - 0 Isolation Patient Management []  - 0 Hearing / Language / Visual special needs []  - 0 Assessment of Community assistance (transportation, D/C planning, etc.) []  - 0 Additional assistance / Altered mentation []  - 0 Support Surface(s) Assessment (bed, cushion, seat,  etc.) INTERVENTIONS - Wound Cleansing / Measurement [name] [name], [name] [name] ([id number]) []  - 0 Simple Wound Cleansing - one wound X- 2 5 Complex Wound Cleansing - multiple wounds X- 1 5 Wound Imaging (photographs -  any number of wounds) []  - 0 Wound Tracing (instead of photographs) []  - 0 Simple Wound Measurement - one wound X- 2 5 Complex Wound Measurement - multiple wounds INTERVENTIONS - Wound Dressings []  - Small Wound Dressing one or multiple wounds 0 X- 1 15 Medium Wound Dressing one or multiple wounds []  - 0 Large Wound Dressing one or multiple wounds []  - 0 Application of Medications - topical []  - 0 Application of Medications - injection INTERVENTIONS - Miscellaneous []  - External ear exam 0 []  - 0 Specimen Collection (cultures, biopsies, blood, body fluids, etc.) []  - 0 Specimen(s) / Culture(s) sent or taken to Lab for analysis []  - 0 Patient Transfer (multiple staff / Civil Service fast streamer / Similar devices) []  - 0 Simple Staple / Suture removal (25 or less) []  - 0 Complex Staple / Suture removal (26 or more) []  - 0 Hypo / Hyperglycemic Management (close monitor of Blood Glucose) []  - 0 Ankle / Brachial Index (ABI) - do not check if billed separately X- 1 5 Vital Signs Has the patient been seen at the hospital within the last three years: Yes Total Score: 95 Level Of Care: New/Established - Level 3 Electronic Signature(s) Signed: 11/11/2019 1:58:43 PM By: [name] Entered By: [name] on 11/11/2019 11:48:48 [name] [name] ([id number]) -------------------------------------------------------------------------------- Encounter Discharge Information Details Patient Name: [name] [name] Date of Service: 11/11/2019 11:00 AM Medical Record Number: [id number] Patient Account Number: [id number] Date of Birth/Sex: 12-24-1960 (59 y.o. M) Treating RN: [name] Primary Care Jeannine Pennisi: [name] Other Clinician: Referring Timoth Schara: [name] Treating  Shourya Macpherson/Extender: [name], [name]: 16 Encounter Discharge Information Items Discharge Condition: Stable Ambulatory Status: Ambulatory Discharge Destination: Home Transportation: Private Auto Accompanied By: self Schedule Follow-up Appointment: Yes Clinical Summary of Care: Electronic Signature(s) Signed: 11/11/2019 1:58:43 PM By: [name] Entered By: [name] on 11/11/2019 11:49:33 [name] [name] ([id number]) -------------------------------------------------------------------------------- Lower Extremity Assessment Details Patient Name: [name] [name] Date of Service: 11/11/2019 11:00 AM Medical Record Number: [id number] Patient Account Number: [id number] Date of Birth/Sex: 24-Mar-1961 (58 y.o. M) Treating RN: Cornell Barman Primary Care Rexanne Inocencio: [name] Other Clinician: Referring Kimla Furth: [name] Treating Somya Jauregui/Extender: [name], [name]: 16 Edema Assessment Assessed: [Left: No] [Right: No] [Left: Edema] [Right: :] Calf Left: Right: Point of Measurement: 34 cm From Medial Instep cm 37.6 cm Ankle Left: Right: Point of Measurement: 11 cm From Medial Instep cm 26 cm Vascular Assessment Pulses: Dorsalis Pedis Palpable: [Right:Yes] Electronic Signature(s) Signed: 11/12/2019 11:02:18 AM By: Gretta Cool, BSN, RN, CWS, Kim RN, BSN Entered By: Gretta Cool, BSN, RN, CWS, Kim on 11/11/2019 11:31:18 [name] [name] ([id number]) -------------------------------------------------------------------------------- Multi Wound Chart Details Patient Name: [name] [name] Date of Service: 11/11/2019 11:00 AM Medical Record Number: [id number] Patient Account Number: [id number] Date of Birth/Sex: [birth year] (58 y.o. M) Treating RN: [name] Primary Care Ivionna Verley: [name] Other Clinician: Referring Jacara Benito: [name] Treating Mechelle Pates/Extender: [name], [name]: 16 Vital Signs Height(in): 74 Pulse(bpm):  35 Weight(lbs): 221 Blood Pressure(mmHg): 138/63 Body Mass Index(BMI): 28 Temperature(F): 98.1 Respiratory Rate(breaths/min): 18 Photos: [N/A:N/A] Wound Location: Right, Distal, Lateral Lower Leg Right, Proximal, Lateral Lower Leg N/A Wounding Event: Gradually Appeared Gradually Appeared N/A Primary Etiology: Venous Leg Ulcer Venous Leg Ulcer N/A Comorbid History: Sleep Apnea, Congestive Heart Sleep Apnea, Congestive Heart N/A Failure, Coronary Artery Disease, Failure, Coronary Artery Disease, Hypertension, Peripheral Venous Hypertension, Peripheral Venous Disease, Type II Diabetes, History Disease, Type II Diabetes, History of Burn, Osteoarthritis, Neuropathy of Burn, Osteoarthritis, Neuropathy  Date Acquired: 08/31/2019 09/01/2019 N/A Weeks of Treatment: 10 10 N/A Wound Status: Open Healed - Epithelialized N/A Measurements L x W x D (cm) 0.8x0.9x0.6 0x0x0 N/A Area (cm) : 0.565 0 N/A Volume (cm) : 0.339 0 N/A % Reduction in Area: 82.90% 100.00% N/A % Reduction in Volume: -2.70% 100.00% N/A Classification: Full Thickness Without Exposed Full Thickness Without Exposed N/A Support Structures Support Structures Exudate Amount: Medium None Present N/A Exudate Type: Serous N/A N/A Exudate Color: amber N/A N/A Wound Margin: Flat and Intact Flat and Intact N/A Granulation Amount: Small (1-33%) None Present (0%) N/A Granulation Quality: Pink N/A N/A Necrotic Amount: Large (67-100%) None Present (0%) N/A Exposed Structures: Fat Layer (Subcutaneous Tissue) Fat Layer (Subcutaneous Tissue) N/A Exposed: Yes Exposed: Yes Fascia: No Fascia: No Tendon: No Tendon: No Muscle: No Muscle: No Joint: No Joint: No Bone: No Bone: No Epithelialization: Small (1-33%) Large (67-100%) N/A Treatment Notes Electronic Signature(s) Signed: 11/11/2019 1:58:43 PM By: [name] Entered By: [name] on 11/11/2019 11:47:04 [name] [name] ([id number]) [name] [name]  ([id number]) -------------------------------------------------------------------------------- Multi-Disciplinary Care Plan Details Patient Name: [name] [name] Date of Service: 11/11/2019 11:00 AM Medical Record Number: [id number] Patient Account Number: [id number] Date of Birth/Sex: August 09, 1960 (59 y.o. M) Treating RN: [name] Primary Care Tyeson Tanimoto: [name] Other Clinician: Referring Linzi Ohlinger: [name] Treating Nyelli Samara/Extender: [name], [name]: 27 Active Inactive Orientation to the Wound Care Program Nursing Diagnoses: Knowledge deficit related to the wound healing center program Goals: Patient/caregiver will verbalize understanding of the [place] Program Date Initiated: 07/22/2019 Target Resolution Date: 08/06/2019 Goal Status: Active Interventions: Provide education on orientation to the wound center Notes: Wound/Skin Impairment Nursing Diagnoses: Impaired tissue integrity Goals: Ulcer/skin breakdown will have a volume reduction of 30% by week 4 Date Initiated: 07/22/2019 Target Resolution Date: 08/17/2019 Goal Status: Active Interventions: Assess ulceration(s) every visit Notes: Electronic Signature(s) Signed: 11/11/2019 1:58:43 PM By: [name] Entered By: [name] on 11/11/2019 11:46:56 [name] [name] ([id number]) -------------------------------------------------------------------------------- Pain Assessment Details Patient Name: [name] [name] Date of Service: 11/11/2019 11:00 AM Medical Record Number: [id number] Patient Account Number: [id number] Date of Birth/Sex: 1961-05-02 (59 y.o. M) Treating RN: Cornell Barman Primary Care Aissatou Fronczak: [name] Other Clinician: Referring Tryson Lumley: [name] Treating Cherrell Maybee/Extender: [name], [name]: 16 Active Problems Location of Pain Severity and Description of Pain Patient Has Paino No Site Locations Pain Management and Medication Current Pain  Management: Notes patient states he has no feeling in leg. Electronic Signature(s) Signed: 11/12/2019 11:02:18 AM By: Gretta Cool, BSN, RN, CWS, Kim RN, BSN Entered By: Gretta Cool, BSN, RN, CWS, Kim on 11/11/2019 11:26:48 [name] [name] ([id number]) -------------------------------------------------------------------------------- Patient/Caregiver Education Details Patient Name: [name] [name] Date of Service: 11/11/2019 11:00 AM Medical Record Number: [id number] Patient Account Number: [id number] Date of Birth/Gender: 08-May-1961 (58 y.o. M) Treating RN: [name] Primary Care Physician: [name] Other Clinician: Referring Physician: [name] Treating Physician/Extender: Sharalyn Ink in Treatment: 16 Education Assessment Education Provided To: Patient Education Topics Provided Wound/Skin Impairment: Handouts: Caring for Your Ulcer Methods: Demonstration, Explain/Verbal Responses: State content correctly Electronic Signature(s) Signed: 11/11/2019 1:58:43 PM By: [name] Entered By: [name] on 11/11/2019 11:48:58 [name] [name] ([id number]) -------------------------------------------------------------------------------- Wound Assessment Details Patient Name: [name] [name] Date of Service: 11/11/2019 11:00 AM Medical Record Number: [id number] Patient Account Number: [id number] Date of Birth/Sex: Oct 16, 1960 (58 y.o. M) Treating RN: Cornell Barman Primary Care Jeven Topper: [name] Other Clinician: Referring Shemaiah Round: [name] Treating Ailah Barna/Extender: [name], [name]: 28  Wound Status Wound Number: 3 Primary Venous Leg Ulcer Etiology: Wound Location: Right, Distal, Lateral Lower Leg Wound Open Wounding Event: Gradually Appeared Status: Date Acquired: 08/31/2019 Comorbid Sleep Apnea, Congestive Heart Failure, Coronary Artery Weeks Of Treatment: 10 History: Disease, Hypertension, Peripheral Venous Disease, Type II Clustered Wound:  No Diabetes, History of Burn, Osteoarthritis, Neuropathy Photos Wound Measurements Length: (cm) 0.8 Width: (cm) 0.9 Depth: (cm) 0.6 Area: (cm) 0.565 Volume: (cm) 0.339 % Reduction in Area: 82.9% % Reduction in Volume: -2.7% Epithelialization: Small (1-33%) Tunneling: No Undermining: No Wound Description Classification: Full Thickness Without Exposed Support Structu Wound Margin: Flat and Intact Exudate Amount: Medium Exudate Type: Serous Exudate Color: amber res Foul Odor After Cleansing: No Slough/Fibrino Yes Wound Bed Granulation Amount: Small (1-33%) Exposed Structure Granulation Quality: Pink Fascia Exposed: No Necrotic Amount: Large (67-100%) Fat Layer (Subcutaneous Tissue) Exposed: Yes Necrotic Quality: Adherent Slough Tendon Exposed: No Muscle Exposed: No Joint Exposed: No Bone Exposed: No Treatment Notes Wound #3 (Right, Distal, Lateral Lower Leg) Notes prisma, BFD Electronic Signature(s) SABURO, [name] [name] ([id number]) Signed: 11/12/2019 11:02:18 AM By: Gretta Cool, BSN, RN, CWS, Kim RN, BSN Entered By: Gretta Cool, BSN, RN, CWS, Kim on 11/11/2019 11:29:33 [name] [name] ([id number]) -------------------------------------------------------------------------------- Wound Assessment Details Patient Name: [name] [name] Date of Service: 11/11/2019 11:00 AM Medical Record Number: [id number] Patient Account Number: [id number] Date of Birth/Sex: [birth year] (58 y.o. M) Treating RN: Cornell Barman Primary Care Euphemia Lingerfelt: [name] Other Clinician: Referring Filicia Scogin: [name] Treating Dave Mannes/Extender: [name], [name]: 16 Wound Status Wound Number: 4 Primary Venous Leg Ulcer Etiology: Wound Location: Right, Proximal, Lateral Lower Leg Wound Healed - Epithelialized Wounding Event: Gradually Appeared Status: Date Acquired: 09/01/2019 Comorbid Sleep Apnea, Congestive Heart Failure, Coronary Artery Weeks Of Treatment: 10 History: Disease, Hypertension,  Peripheral Venous Disease, Type II Clustered Wound: No Diabetes, History of Burn, Osteoarthritis, Neuropathy Photos Wound Measurements Length: (cm) 0 Width: (cm) 0 Depth: (cm) 0 Area: (cm) 0 Volume: (cm) 0 % Reduction in Area: 100% % Reduction in Volume: 100% Epithelialization: Large (67-100%) Wound Description Classification: Full Thickness Without Exposed Support Structure Wound Margin: Flat and Intact Exudate Amount: None Present s Foul Odor After Cleansing: No Slough/Fibrino Yes Wound Bed Granulation Amount: None Present (0%) Exposed Structure Necrotic Amount: None Present (0%) Fascia Exposed: No Fat Layer (Subcutaneous Tissue) Exposed: Yes Tendon Exposed: No Muscle Exposed: No Joint Exposed: No Bone Exposed: No Electronic Signature(s) Signed: 11/12/2019 11:02:18 AM By: Gretta Cool, BSN, RN, CWS, Kim RN, BSN Entered By: Gretta Cool, BSN, RN, CWS, Kim on 11/11/2019 11:30:06 [name] [name] ([id number]) -------------------------------------------------------------------------------- Vitals Details Patient Name: [name] [name] Date of Service: 11/11/2019 11:00 AM Medical Record Number: [id number] Patient Account Number: [id number] Date of Birth/Sex: June 05, 1960 (59 y.o. M) Treating RN: [name] Primary Care Tylena Prisk: [name] Other Clinician: Referring Jamaree Hosier: [name] Treating Lauralye Kinn/Extender: [name], [name]: 16 Vital Signs Time Taken: 11:05 Temperature (F): 98.1 Height (in): 74 Pulse (bpm): 65 Weight (lbs): 221 Respiratory Rate (breaths/min): 18 Body Mass Index (BMI): 28.4 Blood Pressure (mmHg): 138/63 Reference Range: 80 - 120 mg / dl Electronic Signature(s) Signed: 11/11/2019 3:30:19 PM By: Lorine Bears RCP, RRT, CHT Entered By: Becky Sax, Amado Nash on 11/11/2019 11:08:02

## 2019-11-11 NOTE — Progress Notes (Addendum)
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 something under control. He sees his kidney doctor on Monday and his primary care provider next week as well. I think he may also need to see his cardiologist. 11/04/2019 upon evaluation today patient appears to be doing slightly better in regard to his wounds. Fortunately there is no signs of active infection at this time. Due to his breathing issues he did have a sleep study last night he is very tired this morning. Otherwise he tells me his breathing has been slightly better since we discontinued using the pumps and the compression wraps. 11/11/2019 upon evaluation today patient appears to be doing well in fact one of his wounds has healed the other is in the process of healing at this time. There does not appear to be any evidence of active infection which is great news and overall I am extremely pleased with where things stand. No fevers, chills, nausea, vomiting, or diarrhea. Objective Constitutional Well-nourished and well-hydrated in no acute distress. Vitals Time Taken: 11:05 AM, Height: 74 in, Weight: 221 lbs, BMI: 28.4, Temperature: 98.1 F, Pulse: 65 bpm, Respiratory Rate: 18 breaths/min, Blood Pressure: 138/63 mmHg. Respiratory normal breathing without  difficulty. Psychiatric this patient is able to make decisions and demonstrates good insight into disease process. Alert and Oriented x 3. pleasant and cooperative. General Notes: Upon inspection patient's wound bed actually showed signs of good granulation at this time there does not appear to be any evidence of  infection and overall I am extremely pleased with the fact that when the wound is healed and the other is measuring significantly smaller I do believe the collagen has been of benefit for him. Integumentary (Hair, Skin) Wound #3 status is Open. Original cause of wound was Gradually Appeared. The wound is located on the Right,Distal,Lateral Lower Leg. The wound measures 0.8cm length x 0.9cm width x 0.6cm depth; 0.565cm^2 area and 0.339cm^3 volume. There is Fat Layer (Subcutaneous Tissue) Exposed exposed. There is no tunneling or undermining noted. There is a medium amount of serous drainage noted. The wound margin is flat and intact. There is small (1-33%) pink granulation within the wound bed. There is a large (67-100%) amount of necrotic tissue within the wound bed including Adherent Slough. Wound #4 status is Healed - Epithelialized. Original cause of wound was Gradually Appeared. The wound is located on the Right,Proximal,Lateral Lower Leg. The wound measures 0cm length x 0cm width x 0cm depth; 0cm^2 area and 0cm^3 volume. There is Fat Layer (Subcutaneous Tissue) Exposed exposed. There is a none present amount of drainage noted. The wound margin is flat and intact. There is no granulation within the wound bed. There is no necrotic tissue within the wound bed. 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 May shower with protection. Anesthetic (add to Medication List): Wound #3 Right,Distal,Lateral Lower Leg: Topical Lidocaine 4% cream applied to wound bed prior to debridement (In Clinic Only). Primary Wound Dressing: Wound #3 Right,Distal,Lateral Lower Leg: Silver Collagen Secondary Dressing: Wound #3 Right,Distal,Lateral Lower Leg: Boardered Foam Dressing Dressing Change Frequency: Wound #3 Right,Distal,Lateral Lower Leg: Change dressing every other day. Follow-up Appointments: Wound #3 Right,Distal,Lateral Lower Leg: Return Appointment in 2 weeks. Edema Control: Wound #3 Right,Distal,Lateral Lower Leg: Elevate legs to the level of the heart and pump ankles as often as possible 1. I would recommend currently that we going to continue with the wound care measures as before specifically regard to the silver collagen he seems to be doing well with this. 2. I would recommend as well that the patient continue to try to elevate his legs much as possible especially in light of the fact that were not really able to compress him due to his breathing difficulty when we attempt to do this. 3. I am also going to suggest at this point that we continue to monitor for any signs of infection right now I do not see anything and I think things are doing quite nicely. We will see patient back for reevaluation in 1 week here in the clinic. If anything worsens or changes patient will contact our office for additional recommendations. Electronic Signature(s) Signed: 11/11/2019 2:57:36 PM By: Worthy Keeler PA-C Entered By: Worthy Keeler on 11/11/2019 14:57:36 [name]  ([id number]) -------------------------------------------------------------------------------- SuperBill Details Patient Name: [name] Date of Service: 11/11/2019 Medical Record Number: [id number] Patient Account Number: [id number] Date of Birth/Sex: 25-Dec-1960 (58 y.o. M) Treating RN: [name] Primary Care Provider: [name] Other Clinician: Referring Provider: [name] Treating Provider/Extender: [name], Laconya Clere Weeks  in Treatment: 16 Diagnosis Coding ICD-10 Codes Code Description E11.621 Type 2 diabetes mellitus with foot ulcer L97.512 Non-pressure chronic ulcer of other part of right foot with fat layer exposed I50.42 Chronic combined systolic (congestive) and diastolic (congestive) heart failure I10 Essential (primary) hypertension I25.10 Atherosclerotic heart disease of native coronary artery without angina pectoris N18.30 Chronic kidney disease, stage 3 unspecified L84 Corns and callosities L97.811 Non-pressure chronic ulcer of other part of right lower leg limited to breakdown of skin Facility Procedures CPT4 Code: [id number] Description: 99213 - WOUND CARE VISIT-LEV 3 EST PT Modifier: Quantity: 1 Physician Procedures CPT4 Code: [id number] Description: 99213 - WC PHYS LEVEL 3 - EST PT Modifier: Quantity: 1 CPT4 Code: Description: ICD-10 Diagnosis Description E11.621 Type 2 diabetes mellitus with foot ulcer L97.512 Non-pressure chronic ulcer of other part of right foot with fat layer ex I50.42 Chronic combined systolic (congestive) and diastolic (congestive) heart I10  Essential (primary) hypertension Modifier: posed failure Quantity: Electronic Signature(s) Signed: 11/11/2019 2:58:08 PM By: Worthy Keeler PA-C Entered By: Worthy Keeler on 11/11/2019 14:58:07

## 2019-11-11 NOTE — Telephone Encounter (Signed)
Left message and asked pt to call back regarding older sleep studies, I need a copy of original PSG that was done at another facility before we can get his cpap machine set up approved. Beth

## 2019-11-16 NOTE — Telephone Encounter (Signed)
Reviewing patient calls and saw this. We are unable to schedule lab work out so far due to not having the schedule available. [facility]?

## 2019-11-22 ENCOUNTER — Other Ambulatory Visit: Payer: Self-pay

## 2019-11-22 DIAGNOSIS — J449 Chronic obstructive pulmonary disease, unspecified: Secondary | ICD-10-CM

## 2019-11-22 DIAGNOSIS — R0602 Shortness of breath: Secondary | ICD-10-CM

## 2019-11-22 MED ORDER — BREO ELLIPTA 100-25 MCG/INH IN AEPB: 1.0000 | INHALATION_SPRAY | Freq: Every day | RESPIRATORY_TRACT | 3 refills | Status: DC

## 2019-11-25 ENCOUNTER — Other Ambulatory Visit: Payer: Self-pay

## 2019-11-25 ENCOUNTER — Telehealth: Payer: Self-pay

## 2019-11-25 ENCOUNTER — Encounter: Payer: Managed Care, Other (non HMO) | Attending: Physician Assistant | Admitting: Physician Assistant

## 2019-11-25 DIAGNOSIS — I890 Lymphedema, not elsewhere classified: Secondary | ICD-10-CM | POA: Diagnosis not present

## 2019-11-25 DIAGNOSIS — I2510 Atherosclerotic heart disease of native coronary artery without angina pectoris: Secondary | ICD-10-CM | POA: Diagnosis not present

## 2019-11-25 DIAGNOSIS — L97512 Non-pressure chronic ulcer of other part of right foot with fat layer exposed: Secondary | ICD-10-CM | POA: Diagnosis not present

## 2019-11-25 DIAGNOSIS — E1151 Type 2 diabetes mellitus with diabetic peripheral angiopathy without gangrene: Secondary | ICD-10-CM | POA: Diagnosis not present

## 2019-11-25 DIAGNOSIS — E1122 Type 2 diabetes mellitus with diabetic chronic kidney disease: Secondary | ICD-10-CM | POA: Insufficient documentation

## 2019-11-25 DIAGNOSIS — E1140 Type 2 diabetes mellitus with diabetic neuropathy, unspecified: Secondary | ICD-10-CM | POA: Insufficient documentation

## 2019-11-25 DIAGNOSIS — L97811 Non-pressure chronic ulcer of other part of right lower leg limited to breakdown of skin: Secondary | ICD-10-CM | POA: Diagnosis not present

## 2019-11-25 DIAGNOSIS — N183 Chronic kidney disease, stage 3 unspecified: Secondary | ICD-10-CM | POA: Insufficient documentation

## 2019-11-25 DIAGNOSIS — I5042 Chronic combined systolic (congestive) and diastolic (congestive) heart failure: Secondary | ICD-10-CM | POA: Insufficient documentation

## 2019-11-25 DIAGNOSIS — L84 Corns and callosities: Secondary | ICD-10-CM | POA: Diagnosis not present

## 2019-11-25 DIAGNOSIS — M1990 Unspecified osteoarthritis, unspecified site: Secondary | ICD-10-CM | POA: Diagnosis not present

## 2019-11-25 DIAGNOSIS — I130 Hypertensive heart and chronic kidney disease with heart failure and stage 1 through stage 4 chronic kidney disease, or unspecified chronic kidney disease: Secondary | ICD-10-CM | POA: Diagnosis not present

## 2019-11-25 DIAGNOSIS — E11621 Type 2 diabetes mellitus with foot ulcer: Secondary | ICD-10-CM | POA: Diagnosis present

## 2019-11-25 NOTE — Telephone Encounter (Signed)
Confirmed appointment on 11/30/2019 and screened for covid. [facility]

## 2019-11-25 NOTE — Progress Notes (Addendum)
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 potentially this could be affecting his lungs and breathing as well especially when we squeeze extra fluid out of the leg into his system. Nonetheless I think that he does not need to use a wrap nor the lymphedema pumps anymore at least until he can figure out what exactly is going on with his physicians to get something under control. He sees his kidney doctor on Monday and his primary care provider next week as well. I think he may also need to see his cardiologist. 11/04/2019 upon evaluation today patient appears to be doing slightly better in regard to his wounds. Fortunately there is no signs of active infection at this time. Due to his breathing issues he did have a sleep study last night he is very tired this morning. Otherwise he tells me his breathing has been slightly better since we discontinued using the pumps and the compression wraps. 11/11/2019 upon evaluation today patient appears to be doing well in fact one of his wounds has healed the other is in the process of healing at this time. There does not appear to be any evidence of active infection which is great news and overall I am extremely pleased with where things stand. No fevers, chills, nausea, vomiting, or diarrhea. 11/25/2019 upon evaluation today patient appears to be doing much better in regard to his leg ulcer. He has been tolerating the  dressing changes without complication. Fortunately there is no signs of active infection no fevers, chills, nausea, vomiting, or diarrhea. Objective Constitutional Well-nourished and well-hydrated in no acute distress. Vitals Time Taken: 10:19 AM, Height: 74 in, Weight: 221 lbs, BMI: 28.4, Temperature: 99.0  F, Pulse: 77 bpm, Respiratory Rate: 18 breaths/min, Blood Pressure: 171/69 mmHg. Respiratory normal breathing without difficulty. Psychiatric this patient is able to make decisions and demonstrates good insight into disease process. Alert and Oriented x 3. pleasant and cooperative. General Notes: Upon inspection patient's wound bed actually showed signs of good granulation and overall very pleased with where things stand. There is no evidence of active infection at this time. Integumentary (Hair, Skin) Wound #3 status is Open. Original cause of wound was Gradually Appeared. The wound is located on the Right,Distal,Lateral Lower Leg. The wound measures 0.3cm length x 0.3cm width x 0.2cm depth; 0.071cm^2 area and 0.014cm^3 volume. There is Fat Layer (Subcutaneous Tissue) Exposed exposed. There is no tunneling or undermining noted. There is a medium amount of serous drainage noted. The wound margin is flat and intact. There is large (67-100%) pink granulation within the wound bed. There is a small (1-33%) amount of necrotic tissue within the wound bed including Adherent Slough. 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 May shower with protection. Anesthetic (add to Medication List): Wound #3 Right,Distal,Lateral Lower Leg: Topical Lidocaine 4% cream applied to wound bed prior to debridement (In Clinic Only). Primary Wound Dressing: Wound #3 Right,Distal,Lateral Lower Leg: Silver Collagen Secondary Dressing: Wound #3 Right,Distal,Lateral Lower Leg: Boardered Foam Dressing Dressing Change Frequency: Wound #3 Right,Distal,Lateral Lower Leg: Change dressing every other day. Follow-up Appointments: Wound #3 Right,Distal,Lateral Lower Leg: Return Appointment in 3 weeks. Edema Control: Wound #3 Right,Distal,Lateral Lower Leg: Elevate legs to the level of the heart and pump ankles as often as possible 1. I would recommend currently that we go ahead and continue with the wound care measures as before. This is good include utilization of the silver collagen dressing which does seem to be beneficial for him. 2. We will continue as well with the bordered foam dressing to cover which seems to be doing well. 3. Patient seems to be doing excellent in regard to swelling this is dramatically improved as well which is great news. We will see patient back for reevaluation in 3 weeks here in the clinic. If anything worsens or changes patient will contact our office for additional recommendations. Electronic Signature(s) Signed: 11/25/2019 3:57:33 PM By: [name] Keeler PA-C Entered By: [name] [name] on 11/25/2019 15:57:33 [name] [name] ([id number]) -------------------------------------------------------------------------------- SuperBill Details Patient Name: [name] [name] Date of Service: 11/25/2019 Medical Record Number: [id number] Patient Account Number: [id number] Date of Birth/Sex: [birth year] (58 y.o. M) Treating RN: [name] [name] Primary Care Provider: Clayborn [name] Other Clinician: Referring  Provider: Clayborn [name] Treating Provider/Extender: [name] [name], Rosita Guzzetta Weeks in Treatment: 18 Diagnosis Coding ICD-10 Codes Code Description E11.621 Type 2 diabetes mellitus with foot ulcer L97.512 Non-pressure chronic ulcer of other part of right foot with fat layer exposed I50.42 Chronic combined systolic (congestive) and diastolic (congestive) heart failure I10 Essential (primary) hypertension I25.10 Atherosclerotic heart disease of native coronary artery without angina pectoris N18.30 Chronic kidney disease, stage 3 unspecified L84 Corns and callosities L97.811 Non-pressure chronic ulcer of other part of right lower  leg limited to breakdown of skin Facility Procedures CPT4 Code: 42595638 Description: [place] VISIT-LEV 3 EST PT Modifier: Quantity: 1 Physician Procedures CPT4 Code: 7564332 Description: 95188 - WC PHYS LEVEL 3 - EST PT Modifier: Quantity: 1 CPT4 Code: Description: ICD-10 Diagnosis Description E11.621 Type 2 diabetes mellitus with foot ulcer L97.512 Non-pressure chronic ulcer of other part of right foot with fat layer ex I50.42 Chronic combined systolic (congestive) and diastolic (congestive) heart I10  Essential (primary) hypertension Modifier: posed failure Quantity: Electronic Signature(s) Signed: 11/25/2019 3:57:47 PM By: [name] Keeler PA-C Entered By: [name] [name] on 11/25/2019 15:57:46

## 2019-11-25 NOTE — Progress Notes (Signed)
[name] [name], [name] [name] ([id number]) Visit Report for 11/25/2019 Arrival Information Details Patient Name: [name] [name], [name] [name] Date of Service: 11/25/2019 10:45 AM Medical Record Number: [id number] Patient Account Number: [id number] Date of Birth/Sex: August 24, 1960 (59 y.o. M) Treating RN: [name] Primary Care Vayda Dungee: [name] Other Clinician: Referring Elvan Ebron: [name] Treating Ethelyn Cerniglia/Extender: [name], [name]: 18 Visit Information History Since Last Visit Added or deleted any medications: No Patient Arrived: Cane Any new allergies or adverse reactions: No Arrival Time: 10:16 Had a fall or experienced change in No Accompanied By: self activities of daily living that may affect Transfer Assistance: None risk of falls: Patient Identification Verified: Yes Signs or symptoms of abuse/neglect since last visito No Secondary Verification Process Completed: Yes Hospitalized since last visit: No Implantable device outside of the clinic excluding No cellular tissue based products placed in the center since last visit: Has Dressing in Place as Prescribed: Yes Pain Present Now: Yes Electronic Signature(s) Signed: 11/25/2019 12:42:51 PM By: Sandre Kitty Entered By: Sandre Kitty on 11/25/2019 10:16:43 [name] [name] ([id number]) -------------------------------------------------------------------------------- Clinic Level of Care Assessment Details Patient Name: [name] [name] Date of Service: 11/25/2019 10:45 AM Medical Record Number: [id number] Patient Account Number: [id number] Date of Birth/Sex: July 21, 1960 (59 y.o. M) Treating RN: [name] Primary Care Gleb Mcguire: [name] Other Clinician: Referring Akeel Reffner: [name] Treating Pellegrino Kennard/Extender: [name], [name]: 18 Clinic Level of Care Assessment Items TOOL 4 Quantity Score []  - Use when only an EandM is performed on FOLLOW-UP visit 0 ASSESSMENTS - Nursing Assessment /  Reassessment X - Reassessment of Co-morbidities (includes updates in patient status) 1 10 X- 1 5 Reassessment of Adherence to Treatment Plan ASSESSMENTS - Wound and Skin Assessment / Reassessment X - Simple Wound Assessment / Reassessment - one wound 1 5 []  - 0 Complex Wound Assessment / Reassessment - multiple wounds []  - 0 Dermatologic / Skin Assessment (not related to wound area) ASSESSMENTS - Focused Assessment []  - Circumferential Edema Measurements - multi extremities 0 []  - 0 Nutritional Assessment / Counseling / Intervention X- 1 5 Lower Extremity Assessment (monofilament, tuning fork, pulses) []  - 0 Peripheral Arterial Disease Assessment (using hand held doppler) ASSESSMENTS - Ostomy and/or Continence Assessment and Care []  - Incontinence Assessment and Management 0 []  - 0 Ostomy Care Assessment and Management (repouching, etc.) PROCESS - Coordination of Care X - Simple Patient / Family Education for ongoing care 1 15 []  - 0 Complex (extensive) Patient / Family Education for ongoing care []  - 0 Staff obtains Programmer, systems, Records, Test Results / Process Orders []  - 0 Staff telephones HHA, Nursing Homes / Clarify orders / etc []  - 0 Routine Transfer to another Facility (non-emergent condition) []  - 0 Routine Hospital Admission (non-emergent condition) []  - 0 New Admissions / Biomedical engineer / Ordering NPWT, Apligraf, etc. []  - 0 Emergency Hospital Admission (emergent condition) X- 1 10 Simple Discharge Coordination []  - 0 Complex (extensive) Discharge Coordination PROCESS - Special Needs []  - Pediatric / Minor Patient Management 0 []  - 0 Isolation Patient Management []  - 0 Hearing / Language / Visual special needs []  - 0 Assessment of Community assistance (transportation, D/C planning, etc.) []  - 0 Additional assistance / Altered mentation []  - 0 Support Surface(s) Assessment (bed, cushion, seat, etc.) INTERVENTIONS - Wound Cleansing /  Measurement [name] [name], [name] [name] ([id number]) X- 1 5 Simple Wound Cleansing - one wound []  - 0 Complex Wound Cleansing - multiple wounds X- 1 5 Wound Imaging (photographs - any number of wounds) []  -  0 Wound Tracing (instead of photographs) X- 1 5 Simple Wound Measurement - one wound []  - 0 Complex Wound Measurement - multiple wounds INTERVENTIONS - Wound Dressings []  - Small Wound Dressing one or multiple wounds 0 X- 1 15 Medium Wound Dressing one or multiple wounds []  - 0 Large Wound Dressing one or multiple wounds []  - 0 Application of Medications - topical []  - 0 Application of Medications - injection INTERVENTIONS - Miscellaneous []  - External ear exam 0 []  - 0 Specimen Collection (cultures, biopsies, blood, body fluids, etc.) []  - 0 Specimen(s) / Culture(s) sent or taken to Lab for analysis []  - 0 Patient Transfer (multiple staff / Civil Service fast streamer / Similar devices) []  - 0 Simple Staple / Suture removal (25 or less) []  - 0 Complex Staple / Suture removal (26 or more) []  - 0 Hypo / Hyperglycemic Management (close monitor of Blood Glucose) []  - 0 Ankle / Brachial Index (ABI) - do not check if billed separately X- 1 5 Vital Signs Has the patient been seen at the hospital within the last three years: Yes Total Score: 85 Level Of Care: New/Established - Level 3 Electronic Signature(s) Signed: 11/25/2019 3:55:13 PM By: [name] Entered By: [name] on 11/25/2019 10:34:41 [name] [name] ([id number]) -------------------------------------------------------------------------------- Encounter Discharge Information Details Patient Name: [name] [name] Date of Service: 11/25/2019 10:45 AM Medical Record Number: [id number] Patient Account Number: [id number] Date of Birth/Sex: 04-21-1961 (59 y.o. M) Treating RN: [name] Primary Care Zerek Litsey: [name] Other Clinician: Referring Virgal Warmuth: [name] Treating Yuliet Needs/Extender: [name], [name]:  67 Encounter Discharge Information Items Discharge Condition: Stable Ambulatory Status: Ambulatory Discharge Destination: Home Transportation: Private Auto Accompanied By: self Schedule Follow-up Appointment: Yes Clinical Summary of Care: Electronic Signature(s) Signed: 11/25/2019 3:55:13 PM By: [name] Entered By: [name] on 11/25/2019 10:35:23 [name] [name] ([id number]) -------------------------------------------------------------------------------- Lower Extremity Assessment Details Patient Name: [name] [name] Date of Service: 11/25/2019 10:45 AM Medical Record Number: [id number] Patient Account Number: [id number] Date of Birth/Sex: Oct 15, 1960 (58 y.o. M) Treating RN: [name] Primary Care Syble Picco: [name] Other Clinician: Referring Lum Stillinger: [name] Treating Rmani Kellogg/Extender: [name], [name]: 18 Edema Assessment Assessed: [Left: No] [Right: No] Edema: [Left: N] [Right: o] Vascular Assessment Pulses: Dorsalis Pedis Palpable: [Right:Yes] Electronic Signature(s) Signed: 11/25/2019 3:55:13 PM By: [name] Entered By: [name] on 11/25/2019 10:33:36 [name] [name] ([id number]) -------------------------------------------------------------------------------- Multi Wound Chart Details Patient Name: [name] [name] Date of Service: 11/25/2019 10:45 AM Medical Record Number: [id number] Patient Account Number: [id number] Date of Birth/Sex: 06/17/1960 (59 y.o. M) Treating RN: [name] Primary Care Ambra Haverstick: [name] Other Clinician: Referring Ashly Yepez: [name] Treating Abrahan Fulmore/Extender: [name], [name]: 18 Vital Signs Height(in): 74 Pulse(bpm): 62 Weight(lbs): 221 Blood Pressure(mmHg): 171/69 Body Mass Index(BMI): 28 Temperature(F): 99.0 Respiratory Rate(breaths/min): 18 Photos: [N/A:N/A] Wound Location: Right, Distal, Lateral Lower Leg N/A N/A Wounding Event: Gradually Appeared N/A  N/A Primary Etiology: Venous Leg Ulcer N/A N/A Comorbid History: Sleep Apnea, Congestive Heart N/A N/A Failure, Coronary Artery Disease, Hypertension, Peripheral Venous Disease, Type II Diabetes, History of Burn, Osteoarthritis, Neuropathy Date Acquired: 08/31/2019 N/A N/A Weeks of Treatment: 12 N/A N/A Wound Status: Open N/A N/A Measurements L x W x D (cm) 0.3x0.3x0.2 N/A N/A Area (cm) : 0.071 N/A N/A Volume (cm) : 0.014 N/A N/A % Reduction in Area: 97.80% N/A N/A % Reduction in Volume: 95.80% N/A N/A Classification: Full Thickness Without Exposed N/A N/A Support Structures Exudate Amount: Medium N/A N/A Exudate  Type: Serous N/A N/A Exudate Color: amber N/A N/A Wound Margin: Flat and Intact N/A N/A Granulation Amount: Large (67-100%) N/A N/A Granulation Quality: Pink N/A N/A Necrotic Amount: Small (1-33%) N/A N/A Exposed Structures: Fat Layer (Subcutaneous Tissue) N/A N/A Exposed: Yes Fascia: No Tendon: No Muscle: No Joint: No Bone: No Epithelialization: Medium (34-66%) N/A N/A Treatment Notes Electronic Signature(s) Signed: 11/25/2019 3:55:13 PM By: [name] Entered By: [name] on 11/25/2019 10:33:47 [name] [name] ([id number]) [name] [name] ([id number]) -------------------------------------------------------------------------------- Multi-Disciplinary Care Plan Details Patient Name: [name] [name] Date of Service: 11/25/2019 10:45 AM Medical Record Number: [id number] Patient Account Number: [id number] Date of Birth/Sex: 08/10/1960 (59 y.o. M) Treating RN: [name] Primary Care Giannamarie Paulus: [name] Other Clinician: Referring Tytan Sandate: [name] Treating Baylei Siebels/Extender: [name], [name]: 58 Active Inactive Orientation to the Wound Care Program Nursing Diagnoses: Knowledge deficit related to the wound healing center program Goals: Patient/caregiver will verbalize understanding of the [facility] Program Date  Initiated: 07/22/2019 Target Resolution Date: 08/06/2019 Goal Status: Active Interventions: Provide education on orientation to the wound center Notes: Wound/Skin Impairment Nursing Diagnoses: Impaired tissue integrity Goals: Ulcer/skin breakdown will have a volume reduction of 30% by week 4 Date Initiated: 07/22/2019 Target Resolution Date: 08/17/2019 Goal Status: Active Interventions: Assess ulceration(s) every visit Notes: Electronic Signature(s) Signed: 11/25/2019 3:55:13 PM By: [name] Entered By: [name] on 11/25/2019 10:33:41 [name] [name] ([id number]) -------------------------------------------------------------------------------- Pain Assessment Details Patient Name: [name] [name] Date of Service: 11/25/2019 10:45 AM Medical Record Number: [id number] Patient Account Number: [id number] Date of Birth/Sex: June 01, 1960 (59 y.o. M) Treating RN: [name] Primary Care Miyeko Mahlum: [name] Other Clinician: Referring Jaylynn Siefert: [name] Treating Agostino Gorin/Extender: [name], [name]: 46 Active Problems Location of Pain Severity and Description of Pain Patient Has Paino Yes Site Locations Rate the pain. Current Pain Level: 6 Pain Management and Medication Current Pain Management: Electronic Signature(s) Signed: 11/25/2019 12:42:51 PM By: Sandre Kitty Signed: 11/25/2019 3:55:13 PM By: [name] Entered By: Sandre Kitty on 11/25/2019 10:18:55 [name] [name] ([id number]) -------------------------------------------------------------------------------- Patient/Caregiver Education Details Patient Name: [name] [name] Date of Service: 11/25/2019 10:45 AM Medical Record Number: [id number] Patient Account Number: [id number] Date of Birth/Gender: 1960/08/13 (58 y.o. M) Treating RN: [name] Primary Care Physician: [name] Other Clinician: Referring Physician: [name] Treating Physician/Extender: Sharalyn Ink in  Treatment: 45 Education Assessment Education Provided To: Patient Education Topics Provided Wound/Skin Impairment: Handouts: Caring for Your Ulcer Methods: Demonstration, Explain/Verbal Responses: State content correctly Electronic Signature(s) Signed: 11/25/2019 3:55:13 PM By: [name] Entered By: [name] on 11/25/2019 10:34:53 [name] [name] ([id number]) -------------------------------------------------------------------------------- Wound Assessment Details Patient Name: [name] [name] Date of Service: 11/25/2019 10:45 AM Medical Record Number: [id number] Patient Account Number: [id number] Date of Birth/Sex: June 04, 1960 (58 y.o. M) Treating RN: [name] Primary Care Moni Rothrock: [name] Other Clinician: Referring Sadeen Wiegel: [name] Treating Hanaa Payes/Extender: [name], [name]: 18 Wound Status Wound Number: 3 Primary Venous Leg Ulcer Etiology: Wound Location: Right, Distal, Lateral Lower Leg Wound Open Wounding Event: Gradually Appeared Status: Date Acquired: 08/31/2019 Comorbid Sleep Apnea, Congestive Heart Failure, Coronary Artery Weeks Of Treatment: 12 History: Disease, Hypertension, Peripheral Venous Disease, Type II Clustered Wound: No Diabetes, History of Burn, Osteoarthritis, Neuropathy Photos Wound Measurements Length: (cm) 0.3 Width: (cm) 0.3 Depth: (cm) 0.2 Area: (cm) 0.071 Volume: (cm) 0.014 % Reduction in Area: 97.8% % Reduction in Volume: 95.8% Epithelialization: Medium (34-66%) Tunneling: No Undermining: No Wound Description Classification: Full Thickness Without Exposed Support Structu  Wound Margin: Flat and Intact Exudate Amount: Medium Exudate Type: Serous Exudate Color: amber res Foul Odor After Cleansing: No Slough/Fibrino Yes Wound Bed Granulation Amount: Large (67-100%) Exposed Structure Granulation Quality: Pink Fascia Exposed: No Necrotic Amount: Small (1-33%) Fat Layer (Subcutaneous Tissue)  Exposed: Yes Necrotic Quality: Adherent Slough Tendon Exposed: No Muscle Exposed: No Joint Exposed: No Bone Exposed: No Treatment Notes Wound #3 (Right, Distal, Lateral Lower Leg) Notes prisma, BFD Electronic Signature(s) [name] [name], [name] [name] ([id number]) Signed: 11/25/2019 3:55:13 PM By: [name] Entered By: [name] on 11/25/2019 10:33:27 [name] [name] ([id number]) -------------------------------------------------------------------------------- Vitals Details Patient Name: [name] [name] Date of Service: 11/25/2019 10:45 AM Medical Record Number: [id number] Patient Account Number: [id number] Date of Birth/Sex: Oct 25, 1960 (59 y.o. M) Treating RN: [name] Primary Care Lorielle Boehning: [name] Other Clinician: Referring Siedah Sedor: [name] Treating Polette Nofsinger/Extender: [name], [name]: 18 Vital Signs Time Taken: 10:19 Temperature (F): 99.0 Height (in): 74 Pulse (bpm): 77 Weight (lbs): 221 Respiratory Rate (breaths/min): 18 Body Mass Index (BMI): 28.4 Blood Pressure (mmHg): 171/69 Reference Range: 80 - 120 mg / dl Electronic Signature(s) Signed: 11/25/2019 12:42:51 PM By: Sandre Kitty Entered By: Sandre Kitty on 11/25/2019 10:19:26

## 2019-11-30 ENCOUNTER — Ambulatory Visit (INDEPENDENT_AMBULATORY_CARE_PROVIDER_SITE_OTHER): Payer: Managed Care, Other (non HMO) | Admitting: Adult Health

## 2019-11-30 ENCOUNTER — Ambulatory Visit: Payer: Managed Care, Other (non HMO) | Admitting: Adult Health

## 2019-11-30 ENCOUNTER — Other Ambulatory Visit: Payer: Self-pay

## 2019-11-30 ENCOUNTER — Encounter: Payer: Self-pay | Admitting: Adult Health

## 2019-11-30 VITALS — BP 98/56 | HR 61 | Temp 97.5°F | Resp 16 | Ht 74.0 in | Wt 213.0 lb

## 2019-11-30 DIAGNOSIS — I700 Atherosclerosis of aorta: Secondary | ICD-10-CM

## 2019-11-30 DIAGNOSIS — Z0001 Encounter for general adult medical examination with abnormal findings: Secondary | ICD-10-CM

## 2019-11-30 DIAGNOSIS — E042 Nontoxic multinodular goiter: Secondary | ICD-10-CM | POA: Diagnosis not present

## 2019-11-30 DIAGNOSIS — Z79899 Other long term (current) drug therapy: Secondary | ICD-10-CM

## 2019-11-30 DIAGNOSIS — Z125 Encounter for screening for malignant neoplasm of prostate: Secondary | ICD-10-CM | POA: Diagnosis not present

## 2019-11-30 DIAGNOSIS — I10 Essential (primary) hypertension: Secondary | ICD-10-CM

## 2019-11-30 NOTE — Progress Notes (Signed)
[facility] [place], [place] [address]  Internal MEDICINE  Office Visit Note  Patient Name: [name] [name]  [id number]  [id number]  Date of Service: 12/16/2019  Chief Complaint  Patient presents with  . Annual Exam    talk about disability   . Diabetes  . Gastroesophageal Reflux  . Hyperlipidemia  . Hypertension    HPI Pt is here for follow up on DM, GERD, HLD and HTN.  He reports he is at his baseline at this time.  He continues to have multiple medical issues.  His blood sugar remains elevated and averages around 320 mg/dl. He denies any current wounds on his feet, but does have a few places on his ankles/egs that are followed by wound clinic. His blood pressure is not high at this time.  His current bp is 98/56.  He sees Endocrinology for his DM.      Current Medication: Outpatient Encounter Medications as of 11/30/2019  Medication Sig  . Alirocumab (PRALUENT) 75 MG/ML SOAJ Inject 75 mg into the skin every 14 (fourteen) days.  . carvedilol (COREG) 12.5 MG tablet Take 12.5 mg by mouth 2 (two) times daily with a meal.  . ezetimibe (ZETIA) 10 MG tablet Take 10 mg by mouth daily.  . hydrALAZINE (APRESOLINE) 50 MG tablet Take 1 tablet (50 mg total) by mouth 3 (three) times daily.  . insulin degludec (TRESIBA FLEXTOUCH) 100 UNIT/ML SOPN FlexTouch Pen Inject 140 Units into the skin daily.  . insulin lispro (HUMALOG KWIKPEN) 100 UNIT/ML KwikPen Inject into the skin. 0-20 units per sliding scale  . metolazone (ZAROXOLYN) 5 MG tablet Take 5 mg by mouth 2 (two) times a week.  . pantoprazole (PROTONIX) 40 MG tablet Take 40 mg by mouth daily.  . [DISCONTINUED] albuterol (VENTOLIN HFA) 108 (90 Base) MCG/ACT inhaler Inhale 2 puffs into the lungs every 6 (six) hours as needed for wheezing or shortness of breath. (Patient not taking: Reported on 12/02/2019)  . [DISCONTINUED] fluticasone furoate-vilanterol (BREO ELLIPTA) 100-25 MCG/INH AEPB Inhale 1 puff into the lungs  daily. (Patient not taking: Reported on 12/02/2019)  . [DISCONTINUED] torsemide (DEMADEX) 20 MG tablet Take 2 tablets (40 mg total) by mouth 2 (two) times daily. Take 2 tablets (40 mg) daily with 2 tablets (40 mg) in the afternoon as needed (Patient not taking: Reported on 12/02/2019)   No facility-administered encounter medications on file as of 11/30/2019.    Surgical History: Past Surgical History:  Procedure Laterality Date  . bone graft surgery    . CARDIAC CATHETERIZATION  2013   S/p PCI   . CARDIAC CATHETERIZATION  2018   S/p CABG  . CARDIAC SURGERY    . CORONARY ARTERY BYPASS GRAFT  2018   (LIMA-LAD,VG-RCA,VG-OM1,VG-D1)  . EYE SURGERY    . PERIPHERAL VASCULAR CATHETERIZATION Right 10/28/2016   PTA/DEB Right SFA  . WRIST SURGERY      Medical History: Past Medical History:  Diagnosis Date  . Arrhythmia   . Arthritis   . CAD (coronary artery disease)    a. 12/2016 s/p CABG x 4 (LIMA->LAD, VG->RCA, VG->OM1, VG->D1); b. 02/2018 MV: EF 35%, small-med inferolaterlal infarct. No ischemia.  . Colon polyps   . Diabetes ([place])   . Dupre's syndrome   . GERD (gastroesophageal reflux disease)   . HFimpEF (heart failure with improved ejection fraction) ([place])    a. 2018 EF 35%; b. 02/2018 EF 50%; c. 02/2019 EF 40-45%; d. 08/2019 Echo: EF 50-55%, no rwma, Gr2 DD.  Nl RV size/fxn. Triv MR. Mild Ao sclerosis.  . Hyperlipidemia   . Hypertension   . Ischemic cardiomyopathy    a. 02/2018 MV: EF 35%; b. 08/2019 Echo: EF 50-55%.  . Lymphedema   . Migraine   . Neuropathy   . PAD (peripheral artery disease) ([place])    a. 04/2017 Aortoiliac duplex: R iliac dzs.  . Peripheral vascular disease ([place])   . Retinopathy   . Sleep apnea   . Stage 3 chronic kidney disease   . Stomach ulcer     Family History: Family History  Problem Relation Age of Onset  . Diabetes Mother   . Heart disease Father     Social History   Socioeconomic History  . Marital status: Married    Spouse name: Not on file   . Number of children: Not on file  . Years of education: Not on file  . Highest education level: Not on file  Occupational History  . Not on file  Tobacco Use  . Smoking status: Former Smoker    Types: Cigarettes    Quit date: 05/18/2012    Years since quitting: 7.5  . Smokeless tobacco: Never Used  Vaping Use  . Vaping Use: Never used  Substance and Sexual Activity  . Alcohol use: Yes    Comment: very rarely  . Drug use: Never  . Sexual activity: Not on file  Other Topics Concern  . Not on file  Social History Narrative  . Not on file   Social Determinants of Health   Financial Resource Strain:   . Difficulty of Paying Living Expenses:   Food Insecurity:   . Worried About Charity fundraiser in the Last Year:   . Arboriculturist in the Last Year:   Transportation Needs:   . Film/video editor (Medical):   Marland Kitchen Lack of Transportation (Non-Medical):   Physical Activity:   . Days of Exercise per Week:   . Minutes of Exercise per Session:   Stress:   . Feeling of Stress :   Social Connections:   . Frequency of Communication with Friends and Family:   . Frequency of Social Gatherings with Friends and Family:   . Attends Religious Services:   . Active Member of Clubs or Organizations:   . Attends Archivist Meetings:   Marland Kitchen Marital Status:   Intimate Partner Violence:   . Fear of Current or Ex-Partner:   . Emotionally Abused:   Marland Kitchen Physically Abused:   . Sexually Abused:       Review of Systems  Constitutional: Negative.  Negative for chills, fatigue and unexpected weight change.  HENT: Negative.  Negative for congestion, rhinorrhea, sneezing and sore throat.   Eyes: Negative for redness.  Respiratory: Negative.  Negative for cough, chest tightness and shortness of breath.   Cardiovascular: Negative.  Negative for chest pain and palpitations.  Gastrointestinal: Negative.  Negative for abdominal pain, constipation, diarrhea, nausea and vomiting.   Endocrine: Negative.   Genitourinary: Negative.  Negative for dysuria and frequency.  Musculoskeletal: Negative.  Negative for arthralgias, back pain, joint swelling and neck pain.  Skin: Negative.  Negative for rash.  Allergic/Immunologic: Negative.   Neurological: Negative.  Negative for tremors and numbness.  Hematological: Negative for adenopathy. Does not bruise/bleed easily.  Psychiatric/Behavioral: Negative.  Negative for behavioral problems, sleep disturbance and suicidal ideas. The patient is not nervous/anxious.     Vital Signs: BP (!) 98/56   Pulse 61  Temp (!) 97.5 F (36.4 C)   Resp 16   Ht 6\' 2"  (1.88 m)   Wt 213 lb (96.6 kg)   SpO2 97%   BMI 27.35 kg/m    Physical Exam Vitals and nursing note reviewed.  Constitutional:      General: He is not in acute distress.    Appearance: He is well-developed. He is not diaphoretic.  HENT:     Head: Normocephalic and atraumatic.     Mouth/Throat:     Pharynx: No oropharyngeal exudate.  Eyes:     Pupils: Pupils are equal, round, and reactive to light.  Neck:     Thyroid: No thyromegaly.     Vascular: No JVD.     Trachea: No tracheal deviation.  Cardiovascular:     Rate and Rhythm: Normal rate and regular rhythm.     Heart sounds: Normal heart sounds. No murmur heard.  No friction rub. No gallop.   Pulmonary:     Effort: Pulmonary effort is normal. No respiratory distress.     Breath sounds: Normal breath sounds. No wheezing or rales.  Chest:     Chest wall: No tenderness.  Abdominal:     Palpations: Abdomen is soft.     Tenderness: There is no abdominal tenderness. There is no guarding.  Musculoskeletal:        General: Normal range of motion.     Cervical back: Normal range of motion and neck supple.  Lymphadenopathy:     Cervical: No cervical adenopathy.  Skin:    General: Skin is warm and dry.  Neurological:     Mental Status: He is alert and oriented to person, place, and time.     Cranial Nerves: No  cranial nerve deficit.  Psychiatric:        Behavior: Behavior normal.        Thought Content: Thought content normal.        Judgment: Judgment normal.    Assessment/Plan: 1. Encounter for general adult medical examination with abnormal findings Up to date on phm  - CBC with Differential/Platelet - Lipid Panel With LDL/HDL Ratio - TSH - T4, free - Comprehensive metabolic panel  2. Aortic atherosclerosis ([place]) On CT 11/05/2019, continue zetia.   3. Multiple thyroid nodules - US THYROID; Future  4. Screening for prostate cancer - PSA  5. Long-term use of high-risk medication - CBC with Differential/Platelet - Lipid Panel With LDL/HDL Ratio - TSH - T4, free - Comprehensive metabolic panel  6. Essential hypertension No current issues, continue to monitor.   General Counseling: duey liller understanding of the findings of todays visit and agrees with plan of treatment. I have discussed any further diagnostic evaluation that may be needed or ordered today. We also reviewed his medications today. he has been encouraged to call the office with any questions or concerns that should arise related to todays visit.    Orders Placed This Encounter  Procedures  . US THYROID  . CBC with Differential/Platelet  . Lipid Panel With LDL/HDL Ratio  . TSH  . T4, free  . Comprehensive metabolic panel  . PSA    No orders of the defined types were placed in this encounter.   Time spent: 30 Minutes   This patient was seen by Orson Gear AGNP-C in Collaboration with Dr [name] as a part of collaborative care agreement     Kendell Bane AGNP-C Internal medicine

## 2019-12-02 ENCOUNTER — Encounter: Payer: Self-pay | Admitting: Nurse Practitioner

## 2019-12-02 ENCOUNTER — Ambulatory Visit: Payer: Managed Care, Other (non HMO) | Admitting: Nurse Practitioner

## 2019-12-02 ENCOUNTER — Other Ambulatory Visit: Payer: Self-pay

## 2019-12-02 VITALS — BP 106/64 | HR 74 | Ht 74.0 in | Wt 210.0 lb

## 2019-12-02 DIAGNOSIS — G609 Hereditary and idiopathic neuropathy, unspecified: Secondary | ICD-10-CM

## 2019-12-02 DIAGNOSIS — I5020 Unspecified systolic (congestive) heart failure: Secondary | ICD-10-CM

## 2019-12-02 DIAGNOSIS — I255 Ischemic cardiomyopathy: Secondary | ICD-10-CM

## 2019-12-02 DIAGNOSIS — N1832 Chronic kidney disease, stage 3b: Secondary | ICD-10-CM

## 2019-12-02 DIAGNOSIS — E785 Hyperlipidemia, unspecified: Secondary | ICD-10-CM | POA: Diagnosis not present

## 2019-12-02 DIAGNOSIS — I10 Essential (primary) hypertension: Secondary | ICD-10-CM

## 2019-12-02 DIAGNOSIS — I2510 Atherosclerotic heart disease of native coronary artery without angina pectoris: Secondary | ICD-10-CM

## 2019-12-02 NOTE — Patient Instructions (Addendum)
Medication Instructions:  Your physician recommends that you continue on your current medications as directed. Please refer to the Current Medication list given to you today.  *If you need a refill on your cardiac medications before your next appointment, please call your pharmacy*   Lab Work: NONE If you have labs (blood work) drawn today and your tests are completely normal, you will receive your results only by: Marland Kitchen MyChart Message (if you have MyChart) OR . A paper copy in the mail If you have any lab test that is abnormal or we need to change your treatment, we will call you to review the results.   Testing/Procedures: NONE   Follow-Up: At [facility], you and your health needs are our priority.  As part of our continuing mission to provide you with exceptional heart care, we have created designated Provider Care Teams.  These Care Teams include your primary Cardiologist (physician) and Advanced Practice Providers (APPs -  Physician Assistants and Nurse Practitioners) who all work together to provide you with the care you need, when you need it.  We recommend signing up for the patient portal called "MyChart".  Sign up information is provided on this After Visit Summary.  MyChart is used to connect with patients for Virtual Visits (Telemedicine).  Patients are able to view lab/test results, encounter notes, upcoming appointments, etc.  Non-urgent messages can be sent to your provider as well.   To learn more about what you can do with MyChart, go to [email or web address].    Your next appointment:   4-6 month(s)  The format for your next appointment:   In Person  Provider:   Ida Rogue, MD

## 2019-12-02 NOTE — Progress Notes (Signed)
Office Visit    Patient Name: [name] [name] Date of Encounter: 12/02/2019  Primary Care Provider:  [name], MD Primary Cardiologist:  Ida Rogue, MD  Chief Complaint    59 year old male with a prior history of CAD status post coronary bypass grafting in 2018, heart failure with improved EF (EF 50-55% April 2021), ischemic cardiomyopathy, hypertension, hyperlipidemia (statin intolerant), peripheral vascular disease, diabetes, GERD, and stage III chronic kidney disease, who presents for follow-up related to lower ext edema.  Past Medical History    Past Medical History:  Diagnosis Date  . Arrhythmia   . Arthritis   . CAD (coronary artery disease)    a. 12/2016 s/p CABG x 4 (LIMA->LAD, VG->RCA, VG->OM1, VG->D1); b. 02/2018 MV: EF 35%, small-med inferolaterlal infarct. No ischemia.  . Colon polyps   . Diabetes ([place])   . Dupre's syndrome   . GERD (gastroesophageal reflux disease)   . HFimpEF (heart failure with improved ejection fraction) ([place])    a. 2018 EF 35%; b. 02/2018 EF 50%; c. 02/2019 EF 40-45%; d. 08/2019 Echo: EF 50-55%, no rwma, Gr2 DD. Nl RV size/fxn. Triv MR. Mild Ao sclerosis.  . Hyperlipidemia   . Hypertension   . Ischemic cardiomyopathy    a. 02/2018 MV: EF 35%; b. 08/2019 Echo: EF 50-55%.  . Lymphedema   . Migraine   . Neuropathy   . PAD (peripheral artery disease) ([place])    a. 04/2017 Aortoiliac duplex: R iliac dzs.  . Peripheral vascular disease ([place])   . Retinopathy   . Sleep apnea   . Stage 3 chronic kidney disease   . Stomach ulcer    Past Surgical History:  Procedure Laterality Date  . bone graft surgery    . CARDIAC CATHETERIZATION  2013   S/p PCI   . CARDIAC CATHETERIZATION  2018   S/p CABG  . CARDIAC SURGERY    . CORONARY ARTERY BYPASS GRAFT  2018   (LIMA-LAD,VG-RCA,VG-OM1,VG-D1)  . EYE SURGERY    . PERIPHERAL VASCULAR CATHETERIZATION Right 10/28/2016   PTA/DEB Right SFA  . WRIST SURGERY      Allergies  Allergies  Allergen  Reactions  . Other Other (See Comments)    Pain  With joint stiffness    . Statins     Pain  With joint stiffness   . Atorvastatin Other (See Comments)    Muscle aches Muscle aches  . Entresto [Sacubitril-Valsartan]     Hyperkalemia   . Pravastatin Other (See Comments)    Muscle aches Muscle aches  . Pregabalin Other (See Comments)    Generalized aches and pains Generalized aches and pains  . Rosuvastatin Other (See Comments)    Muscle Aches Other reaction(s): JOINT PAIN Other reaction(s): JOINT PAIN Muscle Aches     History of Present Illness    59 y/o ? w/ the above complex PMH including coronary artery disease status post coronary bypass grafting x 4 in 2018, heart failure with improved EF, ischemic cardiomyopathy, hypertension, hyperlipidemia with statin intolerance, peripheral vascular disease, diabetes, GERD, and stage III chronic kidney disease.  As noted, he underwent CABG x4 in August 2018.  Most recent stress test was in October 2019 which showed a small to medium inferolateral infarct without ischemia.  Though his EF was as low as 35% previously, echo in April of this year showed an EF of 50-55% without regional wall motion abnormalities.  He was last seen in cardiology clinic in April of this year with noted significant  lower extremity swelling.  He was subsequently referred to nephrology and has been placed on metolazone therapy twice a week.  This has made a dramatic difference in his lower extremity edema, which has essentially resolved.  He did see vascular surgery with recommendation for compression stockings however, he has not been using these.  His weight is down to 210 pounds which is the lowest has been in some time.  He has follow-up labs scheduled for this afternoon.  He does not experience chest pain, dyspnea, or claudication symptoms but has significant lower extremity peripheral neuropathy with constant pain which limits his activity significantly.  He has  previously tried medications for neuropathic pain like gabapentin and Lyrica but simply did not tolerate.  He denies palpitations, PND, orthopnea, dizziness, syncope, or early satiety.  Home Medications    Prior to Admission medications   Medication Sig Start Date End Date Taking? Authorizing Provider  albuterol (VENTOLIN HFA) 108 (90 Base) MCG/ACT inhaler Inhale 2 puffs into the lungs every 6 (six) hours as needed for wheezing or shortness of breath. 08/24/19   Ronnell Freshwater, NP  Alirocumab (PRALUENT) 75 MG/ML SOAJ Inject 75 mg into the skin every 14 (fourteen) days. 10/13/19   Minna Merritts, MD  carvedilol (COREG) 12.5 MG tablet Take 12.5 mg by mouth 2 (two) times daily with a meal.    [provider]  ezetimibe (ZETIA) 10 MG tablet Take 10 mg by mouth daily.    [provider]  fluticasone furoate-vilanterol (BREO ELLIPTA) 100-25 MCG/INH AEPB Inhale 1 puff into the lungs daily. 11/22/19   Ronnell Freshwater, NP  hydrALAZINE (APRESOLINE) 50 MG tablet Take 1 tablet (50 mg total) by mouth 3 (three) times daily. 07/26/19   Loel Dubonnet, NP  insulin degludec (TRESIBA FLEXTOUCH) 100 UNIT/ML SOPN FlexTouch Pen Inject 140 Units into the skin daily.    [provider]  insulin lispro (HUMALOG KWIKPEN) 100 UNIT/ML KwikPen Inject into the skin. 0-20 units per sliding scale    [provider]  metolazone (ZAROXOLYN) 5 MG tablet Take 5 mg by mouth 2 (two) times a week.    [provider]  pantoprazole (PROTONIX) 40 MG tablet Take 40 mg by mouth daily.    [provider]  torsemide (DEMADEX) 20 MG tablet Take 2 tablets (40 mg total) by mouth 2 (two) times daily. Take 2 tablets (40 mg) daily with 2 tablets (40 mg) in the afternoon as needed 09/06/19 12/05/19  Minna Merritts, MD  Aspirin 81 mg daily  Review of Systems    Significant improvement in lower extremity swelling.  He denies chest pain, palpitations, dyspnea, PND, orthopnea, dizziness,  syncope, or early satiety.  His chronic bilateral lower extremity neuropathic pain and numbness.  All other systems reviewed and are otherwise negative except as noted above.  Physical Exam    VS:  BP 106/64   Pulse 74   Ht 6\' 2"  (1.88 m)   Wt 210 lb (95.3 kg)   SpO2 98%   BMI 26.96 kg/m  , BMI Body mass index is 26.96 kg/m. GEN: Well nourished, well developed, in no acute distress. HEENT: normal. Neck: Supple, no JVD, carotid bruits, or masses. Cardiac: RRR, 2 of 6 stock ejection murmur at the right upper sternal border, no rubs, or gallops. No clubbing, cyanosis, trace bilateral ankle edema.  Radials/PT 1+ and equal bilaterally.  Respiratory:  Respirations regular and unlabored, clear to auscultation bilaterally. GI: Soft, nontender, nondistended, BS + x  4. MS: no deformity or atrophy. Skin: warm and dry, no rash. Neuro:  Strength and sensation are intact. Psych: Normal affect.  Accessory Clinical Findings    ECG personally reviewed by me today -regular sinus rhythm, 66, leftward axis, left atrial enlargement, inferior infarct, LVH with lateral repolarization abnormalities- no acute changes.  Lab Results  Component Value Date   WBC 9.7 12/02/2019   HGB 14.4 12/02/2019   HCT 44.1 12/02/2019   MCV 91 12/02/2019   PLT 298 12/02/2019   Lab Results  Component Value Date   CREATININE WILL FOLLOW 12/02/2019   BUN WILL FOLLOW 12/02/2019   NA WILL FOLLOW 12/02/2019   K WILL FOLLOW 12/02/2019   CL WILL FOLLOW 12/02/2019   CO2 WILL FOLLOW 12/02/2019   Lab Results  Component Value Date   ALT WILL FOLLOW 12/02/2019   AST WILL FOLLOW 12/02/2019   ALKPHOS WILL FOLLOW 12/02/2019   BILITOT WILL FOLLOW 12/02/2019   Lab Results  Component Value Date   CHOL WILL FOLLOW 12/02/2019   HDL WILL FOLLOW 12/02/2019   LDLCALC WILL FOLLOW 12/02/2019   TRIG WILL FOLLOW 12/02/2019    Lab Results  Component Value Date   HGBA1C 12.2 (A) 11/01/2019    Assessment & Plan    1.   Coronary artery disease: Status post CABG x4 in 2018.  He has been doing well without chest pain.  He remains on aspirin, beta-blocker, and Praluent therapy.  No statin in the setting of prior intolerances.  2.  Heart failure with improved ejection fraction/ischemic cardiomyopathy: Most recent echo with an EF of 50-55%.  At his last visit, he had significant lower extremity swelling and since being placed on metolazone therapy, he has had significant improvement and near complete resolution of lower extremity edema.  He is euvolemic on examination today.  He remains on beta-blocker, hydralazine, and torsemide therapy as well.  No ACE/ARB/Entresto/spironolactone in the setting of improved LV function and also prior history of hyperkalemia on Entresto/CKD 3.  3.  Essential hypertension: Stable on beta-blocker, hydralazine, and diuretic therapy.  4.  Hyperlipidemia: He is statin intolerant but on Praluent.  He is due to have lipids checked today.  5.  Peripheral neuropathy: Patient with significant bilateral lower extremity neuropathic pain and numbness.  He has been managed by primary care and unfortunately has not tolerated gabapentin/Lyrica previously.  His neuropathy significantly limits his activity level and has made it very difficult for him to work.  He plans to seek disability.  6.  Peripheral arterial disease/lymphedema: Mild right iliac disease noted on previous evaluation in 2018.  He denies claudication.  He has been evaluated by vascular surgery with recommendation for compression stockings however, he does not tolerate these and edema has resolved with addition of metolazone.  He remains on aspirin and Praluent.  7.  Type 2 diabetes mellitus: A1c was 12.2 in June.  He says he has follow-up with endocrine plan.  8.  CKD III:  Now followed by nephrology.  For labs today.  9.  Disposition: Follow-up in 4 to 6 months or sooner if necessary. Murray Hodgkins, NP 12/02/2019, 6:11 PM

## 2019-12-02 NOTE — Telephone Encounter (Signed)
Patient coming in for appointment in November and we can recheck his labs at that time.

## 2019-12-03 ENCOUNTER — Telehealth: Payer: Self-pay

## 2019-12-03 LAB — CBC WITH DIFFERENTIAL/PLATELET
Basophils Absolute: 0.1 10*3/uL (ref 0.0–0.2)
Basos: 1 %
EOS (ABSOLUTE): 0.2 10*3/uL (ref 0.0–0.4)
Eos: 3 %
Hematocrit: 44.1 % (ref 37.5–51.0)
Hemoglobin: 14.4 g/dL (ref 13.0–17.7)
Immature Grans (Abs): 0.1 10*3/uL (ref 0.0–0.1)
Immature Granulocytes: 1 %
Lymphocytes Absolute: 2 10*3/uL (ref 0.7–3.1)
Lymphs: 21 %
MCH: 29.6 pg (ref 26.6–33.0)
MCHC: 32.7 g/dL (ref 31.5–35.7)
MCV: 91 fL (ref 79–97)
Monocytes Absolute: 0.7 10*3/uL (ref 0.1–0.9)
Monocytes: 7 %
Neutrophils Absolute: 6.6 10*3/uL (ref 1.4–7.0)
Neutrophils: 67 %
Platelets: 298 10*3/uL (ref 150–450)
RBC: 4.86 x10E6/uL (ref 4.14–5.80)
RDW: 13.3 % (ref 11.6–15.4)
WBC: 9.7 10*3/uL (ref 3.4–10.8)

## 2019-12-03 LAB — COMPREHENSIVE METABOLIC PANEL
ALT: 28 IU/L (ref 0–44)
AST: 22 IU/L (ref 0–40)
Albumin/Globulin Ratio: 1.2 (ref 1.2–2.2)
Albumin: 3.7 g/dL — ABNORMAL LOW (ref 3.8–4.9)
Alkaline Phosphatase: 131 IU/L — ABNORMAL HIGH (ref 48–121)
BUN/Creatinine Ratio: 21 (ref 10–24)
BUN: 57 mg/dL — ABNORMAL HIGH (ref 8–27)
Bilirubin Total: 0.3 mg/dL (ref 0.0–1.2)
CO2: 25 mmol/L (ref 20–29)
Calcium: 9.4 mg/dL (ref 8.6–10.2)
Chloride: 88 mmol/L — ABNORMAL LOW (ref 96–106)
Creatinine, Ser: 2.77 mg/dL — ABNORMAL HIGH (ref 0.76–1.27)
GFR calc Af Amer: 27 mL/min/{1.73_m2} — ABNORMAL LOW (ref >59)
GFR calc non Af Amer: 24 mL/min/{1.73_m2} — ABNORMAL LOW (ref >59)
Globulin, Total: 3.1 g/dL (ref 1.5–4.5)
Glucose: 552 mg/dL (ref 65–99)
Potassium: 4 mmol/L (ref 3.5–5.2)
Sodium: 131 mmol/L — ABNORMAL LOW (ref 134–144)
Total Protein: 6.8 g/dL (ref 6.0–8.5)

## 2019-12-03 LAB — LIPID PANEL WITH LDL/HDL RATIO
Cholesterol, Total: 210 mg/dL — ABNORMAL HIGH (ref 100–199)
HDL: 39 mg/dL — ABNORMAL LOW (ref >39)
LDL Chol Calc (NIH): 78 mg/dL (ref 0–99)
LDL/HDL Ratio: 2 ratio (ref 0.0–3.6)
Triglycerides: 582 mg/dL (ref 0–149)
VLDL Cholesterol Cal: 93 mg/dL — ABNORMAL HIGH (ref 5–40)

## 2019-12-03 LAB — PSA: Prostate Specific Ag, Serum: 0.9 ng/mL (ref 0.0–4.0)

## 2019-12-03 LAB — TSH: TSH: 1.73 u[IU]/mL (ref 0.450–4.500)

## 2019-12-03 LAB — T4, FREE: Free T4: 1.3 ng/dL (ref 0.82–1.77)

## 2019-12-03 NOTE — Telephone Encounter (Signed)
Spoke with pt he was fasting when he did labs as per adam advised that he need call cardiology for triglycerides is 582 and also called endocrinology for his glucose  552 advised need to call them ASAP

## 2019-12-03 NOTE — Telephone Encounter (Signed)
-----   Message from Kendell Bane, NP sent at 12/03/2019  2:17 PM EDT ----- Please ask him if he was fasting for his labs.  His Triglycerides are high, and his sugar.  He needs to call his endocrinologist if his blood sugars are high.

## 2019-12-05 NOTE — Procedures (Signed)
[facility] [address] [place], [place] [address]  Patient Name: [name] [name] DOB: 1960/07/27   SLEEP STUDY INTERPRETATION  DATE OF SERVICE: November 03, 2019   SLEEP STUDY HISTORY: This patient is referred to the sleep lab for a baseline Polysomnography. Pertinent history includes a history of diagnosis of excessive daytime somnolence and snoring.  PROCEDURE: This overnight polysomnogram was performed using the Alice 5 acquisition system using the standard diagnostic protocol as outlined by the AASM. This includes 6 channels of EEG, 2 channelscannels of EOG, chin EMG, bilateral anterior tibialis EMG, nasal/oral thermister, PTAF, chest and abdominal wall movements, ECG and pulse oximetry. Apneas and Hypopneas were scored per AASM definition.  SLEEP ARCHITECHTURE: This is a baseline polysomnograph  study. The total recording time was 301.0 minutes and the patients total sleep time is noted to be 156.0 minutes. Sleep onset latency was 39.7 minutes and is prolonged.  Stage R sleep onset latency was 116.5 minutes. Sleep maintenance efficiency was 82.1% and is decreased.  Sleep staging expressed as a percentage of total sleep time demonstrated 37.8% N1, 36.9% N2 and 13.1% N3  sleep. Stage R represents 12.2% of total sleep time. This is decreased.  There were a total of 95 arousals  for an overall arousal index of 36.5 per hour of sleep. PLMS arousal were noted. Arousals without respiratory events are  noted. This can contribute to sleep architechture disruption.  RESPIRATORY MONITORING:   Patient exhibits significant evidence of sleep disorderd breathing characterized by 111 central apneas, 0 obstructive apneas and 15 mixed apneas. There were 7 obstructive hypopneas and 0 RERAs. Most of the apneas/hypopneas were of central variety. The total apnea hypopnea index (apneas and hypopneas per hour of sleep) is 51.2 respiratory events per hour and is severe.  Respiratory monitoring  demonstrated very mild snoring through the night. There are a total of 41 snoring episodes representing 2.5% of sleep.   Baseline oxygen saturation during wakefulness was 96% and during NREM sleep averaged 94% through the night. Arterial saturation during REM sleep was 88% through the night. There was significant  oxygen desaturation with the respiratory events. Arterial oxygen desaturation occurred of at least 4% was noted with a low saturation of 79%. The study was performed off oxygen.  CARDIAC MONITORING:   Average heart rate is 58 during sleep with a high of 69 beats per minute. Malignant arrhythmias were not noted.  CPAP titration: Patient was started on CPAP as per sleep lab protocol.  Patient was started on a pressure of 5 CWP and increased up to a pressure of 9 CWP.  Patient's respiratory events were monitored and it appears that the best AHI which was achieved was on 9CWP of CPAP.  There was no titration done beyond this.  It should be noted that the patient had increase in central apneas as the pressures were increased which could represent treatment emergent central sleep apnea.  Also desaturation did occur down to 79%.  The titration is basically incomplete and should have been continued beyond the pressure of 9 cm water pressure  IMPRESSIONS:  --This overnight polysomnogram demonstrates severe obstructive and central sleep apnea with an overall AHI 51.2 per hour. --The overall AHI was no worse  during Stage R. --There were associated was arterial oxygen desaturations noted with a low saturation of 79% --There was no significant PLMS noted in this study. --There is very mild snoring noted throughout the study.    RECOMMENDATIONS:  --CPAP titration study is adequate to  control this patient's sleep apnea based on the pressures given.  The patient should have been titrated beyond the pressure of 9 CWP.  It should be noted also that the patient did have emergent central sleep apnea so  therefore would recommend an auto titrating device or a follow-up CPAP or BiPAP titration on the clinical situation.. --Nasal decongestants and antihistamines may be of help for increased upper airways resistance when present. --Weight loss through dietary and lifestyle modification is recommended in the presence of obesity. --A search for and treatment of any underlying cardiopulmonary disease is      recommended in the presence of oxygen desaturations. --Alternative treatment options if the patient is not willing to use CPAP include oral   appliances as well as surgical intervention which may help in the appropriate patient. --Clinical correlation is recommended. Please feel free to call the office for any further  questions or assistance in the care of this patient.     Allyne Gee, MD [facility] Pulmonary Critical Care Medicine Sleep medicine

## 2019-12-16 ENCOUNTER — Other Ambulatory Visit: Payer: Self-pay

## 2019-12-16 ENCOUNTER — Encounter: Payer: Managed Care, Other (non HMO) | Admitting: Physician Assistant

## 2019-12-16 DIAGNOSIS — E11621 Type 2 diabetes mellitus with foot ulcer: Secondary | ICD-10-CM | POA: Diagnosis not present

## 2019-12-16 NOTE — Progress Notes (Addendum)
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 anymore at least until he can figure out what exactly is going on with his physicians to get something under control. He sees his kidney doctor on Monday and his primary care provider next week as well. I think he may also need to see his cardiologist. 11/04/2019 upon evaluation today patient appears to be doing slightly better in regard to his wounds. Fortunately there is no signs of active infection at this time. Due to his breathing issues he did have a sleep study last night he is very tired this morning. Otherwise he tells me his breathing has been slightly better since we discontinued using the pumps and the compression wraps. 11/11/2019 upon evaluation today patient appears to be doing well in fact one of his wounds has healed the other is in the process of healing at this time. There does not appear to be any evidence of active infection which is great news and overall I am extremely pleased with where things stand. No fevers, chills, nausea, vomiting, or diarrhea. 11/25/2019 upon evaluation today patient appears to be doing much better in regard to his leg ulcer. He has been tolerating the dressing changes without complication. Fortunately there is no signs of active infection no fevers, chills, nausea,  vomiting, or diarrhea. 12/16/2019 upon evaluation today patient appears to actually be doing quite well with regard to his wounds.  In fact the 2 wounds that we were dealing with on the leg that were remaining have completely healed. As far as his toe ulcer that is also healed. He does have a couple areas that is bumped on the anterior shin that he tells me are actually doing okay and do not need to be established as chronic wounds I agree the seem to be more superficial at this point. He does however have chronic skin changes over the area of his shin right greater than left that are can be making him prone to issues ongoing. Objective Constitutional Well-nourished and well-hydrated in no acute distress. Vitals Time Taken: 10:46 AM, Height: 74 in, Weight: 221 lbs, BMI: 28.4, Temperature: 97.7 F, Pulse: 70 bpm, Respiratory Rate: 18 breaths/min, Blood Pressure: 142/73 mmHg. Respiratory normal breathing without difficulty. Psychiatric this patient is able to make decisions and demonstrates good insight into disease process. Alert and Oriented x 3. pleasant and cooperative. General Notes: Upon inspection patient's wound bed actually showed signs of good epithelization at this time. Fortunately there is no evidence of active infection which is great news. Overall very pleased with how things have progressed. Integumentary (Hair, Skin) Wound #3 status is Healed - Epithelialized. Original cause of wound was Gradually Appeared. The wound is located on the Right,Distal,Lateral Lower Leg. The wound measures 0cm length x 0cm width x 0cm depth; 0cm^2 area and 0cm^3 volume. There is Fat Layer (Subcutaneous Tissue) Exposed exposed. There is a none present amount of drainage noted. The wound margin is flat and intact. There is no granulation within the wound bed. There is a large (67-100%) amount of necrotic tissue within the wound bed including Eschar. Assessment Active Problems ICD-10 Type 2 diabetes  mellitus with foot ulcer Non-pressure chronic ulcer of other part of right foot with fat layer exposed Morejon, [name] ([id number]) Chronic combined systolic (congestive) and diastolic (congestive) heart failure Essential (primary) hypertension Atherosclerotic heart disease of native coronary artery without angina pectoris Chronic kidney disease, stage 3 unspecified Corns and callosities Non-pressure chronic ulcer of other part of right lower leg limited to breakdown of skin Plan Discharge From [facility] Services: Discharge from [place] complete 1. I would recommend that we discontinue wound care services at this point as the patient appears to be completely healed in regard to the wounds we were following and is doing quite well. 2. I am also can recommend at this time that we go ahead and have the patient use some lotion over his legs in order to try to keep the dry skin down and hopefully allow this to not continue to give him significant complications and troubles. We will see him back for follow-up visit as needed. Electronic Signature(s) Signed: 12/16/2019 11:37:10 AM By: Worthy Keeler PA-C Entered By: Worthy Keeler on 12/16/2019 11:37:09 [name] [name] ([id number]) -------------------------------------------------------------------------------- SuperBill Details Patient Name: [name] [name] Date of Service: 12/16/2019 Medical Record Number: [id number] Patient Account Number: [id number] Date of Birth/Sex: [birth year] (58 y.o. M) Treating RN: [name] Primary Care Provider: [name] Other Clinician: Referring Provider: [name] Treating Provider/Extender: [name], Veria Stradley Weeks in Treatment: 21 Diagnosis Coding ICD-10 Codes Code Description E11.621 Type 2 diabetes mellitus with foot ulcer L97.512 Non-pressure chronic ulcer of other part of right foot with fat layer exposed I50.42 Chronic combined systolic (congestive) and diastolic (congestive)  heart failure I10 Essential (primary) hypertension I25.10 Atherosclerotic heart disease of native coronary artery without angina pectoris N18.30 Chronic kidney disease, stage 3 unspecified L84 Corns  and callosities L97.811 Non-pressure chronic ulcer of other part of right lower leg limited to breakdown of skin Facility Procedures CPT4 Code: 24825003 Description: 70488 - WOUND CARE VISIT-LEV 3 EST PT Modifier: Quantity: 1 Physician Procedures CPT4 Code: 8916945 Description: 03888 - WC PHYS LEVEL 3 - EST PT Modifier: Quantity: 1 CPT4 Code: Description: ICD-10 Diagnosis Description E11.621 Type 2 diabetes mellitus with foot ulcer L97.512 Non-pressure chronic ulcer of other part of right foot with fat layer ex I50.42 Chronic combined systolic (congestive) and diastolic (congestive) heart I10  Essential (primary) hypertension Modifier: posed failure Quantity: Electronic Signature(s) Signed: 12/16/2019 11:37:23 AM By: Worthy Keeler PA-C Entered By: Worthy Keeler on 12/16/2019 11:37:23

## 2019-12-17 NOTE — Progress Notes (Signed)
RISHARD, DELANGE ([id number]) Visit Report for 12/16/2019 Arrival Information Details Patient Name: [name] [name], [name] [name] Date of Service: 12/16/2019 10:30 AM Medical Record Number: [id number] Patient Account Number: [id number] Date of Birth/Sex: 09-04-1960 (59 y.o. M) Treating RN: Cornell Barman Primary Care Messiah Rovira: [name] Other Clinician: Referring Trayven Lumadue: [name] Treating Rasheena Talmadge/Extender: [name], [name]: 21 Visit Information History Since Last Visit Added or deleted any medications: No Patient Arrived: Ambulatory Has Dressing in Place as Prescribed: Yes Arrival Time: 10:45 Pain Present Now: Yes Accompanied By: self Transfer Assistance: None Patient Identification Verified: Yes Secondary Verification Process Completed: Yes Electronic Signature(s) Signed: 12/16/2019 6:25:29 PM By: Gretta Cool, BSN, RN, CWS, Kim RN, BSN Entered By: Gretta Cool, BSN, RN, CWS, Kim on 12/16/2019 10:46:16 [name] [name] ([id number]) -------------------------------------------------------------------------------- Clinic Level of Care Assessment Details Patient Name: [name] [name] Date of Service: 12/16/2019 10:30 AM Medical Record Number: [id number] Patient Account Number: [id number] Date of Birth/Sex: February 04, 1961 (58 y.o. M) Treating RN: [name] Primary Care Imonie Tuch: [name] Other Clinician: Referring Shakir Petrosino: [name] Treating Trinten Boudoin/Extender: [name], [name]: 21 Clinic Level of Care Assessment Items TOOL 4 Quantity Score []  - Use when only an EandM is performed on FOLLOW-UP visit 0 ASSESSMENTS - Nursing Assessment / Reassessment X - Reassessment of Co-morbidities (includes updates in patient status) 1 10 X- 1 5 Reassessment of Adherence to Treatment Plan ASSESSMENTS - Wound and Skin Assessment / Reassessment X - Simple Wound Assessment / Reassessment - one wound 1 5 []  - 0 Complex Wound Assessment / Reassessment - multiple wounds []  -  0 Dermatologic / Skin Assessment (not related to wound area) ASSESSMENTS - Focused Assessment X - Circumferential Edema Measurements - multi extremities 1 5 []  - 0 Nutritional Assessment / Counseling / Intervention X- 1 5 Lower Extremity Assessment (monofilament, tuning fork, pulses) []  - 0 Peripheral Arterial Disease Assessment (using hand held doppler) ASSESSMENTS - Ostomy and/or Continence Assessment and Care []  - Incontinence Assessment and Management 0 []  - 0 Ostomy Care Assessment and Management (repouching, etc.) PROCESS - Coordination of Care X - Simple Patient / Family Education for ongoing care 1 15 []  - 0 Complex (extensive) Patient / Family Education for ongoing care X- 1 10 Staff obtains Programmer, systems, Records, Test Results / Process Orders []  - 0 Staff telephones HHA, Nursing Homes / Clarify orders / etc []  - 0 Routine Transfer to another Facility (non-emergent condition) []  - 0 Routine Hospital Admission (non-emergent condition) []  - 0 New Admissions / Biomedical engineer / Ordering NPWT, Apligraf, etc. []  - 0 Emergency Hospital Admission (emergent condition) X- 1 10 Simple Discharge Coordination []  - 0 Complex (extensive) Discharge Coordination PROCESS - Special Needs []  - Pediatric / Minor Patient Management 0 []  - 0 Isolation Patient Management []  - 0 Hearing / Language / Visual special needs []  - 0 Assessment of Community assistance (transportation, D/C planning, etc.) []  - 0 Additional assistance / Altered mentation []  - 0 Support Surface(s) Assessment (bed, cushion, seat, etc.) INTERVENTIONS - Wound Cleansing / Measurement [name] [name], [name] [name] ([id number]) X- 1 5 Simple Wound Cleansing - one wound []  - 0 Complex Wound Cleansing - multiple wounds X- 1 5 Wound Imaging (photographs - any number of wounds) []  - 0 Wound Tracing (instead of photographs) X- 1 5 Simple Wound Measurement - one wound []  - 0 Complex Wound Measurement - multiple  wounds INTERVENTIONS - Wound Dressings []  - Small Wound Dressing one or multiple wounds 0 []  - 0 Medium Wound Dressing one or multiple  wounds []  - 0 Large Wound Dressing one or multiple wounds []  - 0 Application of Medications - topical []  - 0 Application of Medications - injection INTERVENTIONS - Miscellaneous []  - External ear exam 0 []  - 0 Specimen Collection (cultures, biopsies, blood, body fluids, etc.) []  - 0 Specimen(s) / Culture(s) sent or taken to Lab for analysis []  - 0 Patient Transfer (multiple staff / Civil Service fast streamer / Similar devices) []  - 0 Simple Staple / Suture removal (25 or less) []  - 0 Complex Staple / Suture removal (26 or more) []  - 0 Hypo / Hyperglycemic Management (close monitor of Blood Glucose) []  - 0 Ankle / Brachial Index (ABI) - do not check if billed separately X- 1 5 Vital Signs Has the patient been seen at the hospital within the last three years: Yes Total Score: 85 Level Of Care: New/Established - Level 3 Electronic Signature(s) Signed: 12/16/2019 4:00:14 PM By: [name] Entered By: [name] on 12/16/2019 10:58:29 [name] [name] ([id number]) -------------------------------------------------------------------------------- Encounter Discharge Information Details Patient Name: [name] [name] Date of Service: 12/16/2019 10:30 AM Medical Record Number: [id number] Patient Account Number: [id number] Date of Birth/Sex: 14-Feb-1961 (58 y.o. M) Treating RN: [name] Primary Care Ahmira Boisselle: [name] Other Clinician: Referring Tifanie Gardiner: [name] Treating Alison Breeding/Extender: [name], [name]: 21 Encounter Discharge Information Items Discharge Condition: Stable Ambulatory Status: Ambulatory Discharge Destination: Home Transportation: Private Auto Accompanied By: self Schedule Follow-up Appointment: Yes Clinical Summary of Care: Electronic Signature(s) Signed: 12/16/2019 4:00:14 PM By: [name] Entered By:  [name] on 12/16/2019 10:59:06 [name] [name] ([id number]) -------------------------------------------------------------------------------- Lower Extremity Assessment Details Patient Name: [name] [name] Date of Service: 12/16/2019 10:30 AM Medical Record Number: [id number] Patient Account Number: [id number] Date of Birth/Sex: Sep 02, 1960 (58 y.o. M) Treating RN: Cornell Barman Primary Care Shemica Meath: [name] Other Clinician: Referring Deyra Perdomo: [name] Treating Dequon Schnebly/Extender: [name], [name]: 21 Edema Assessment Assessed: [Left: No] [Right: Yes] [Left: Edema] [Right: :] Vascular Assessment Pulses: Dorsalis Pedis Palpable: [Right:Yes] Electronic Signature(s) Signed: 12/16/2019 6:25:29 PM By: Gretta Cool, BSN, RN, CWS, Kim RN, BSN Entered By: Gretta Cool, BSN, RN, CWS, Kim on 12/16/2019 10:51:22 [name] [name] ([id number]) -------------------------------------------------------------------------------- Multi Wound Chart Details Patient Name: [name] [name] Date of Service: 12/16/2019 10:30 AM Medical Record Number: [id number] Patient Account Number: [id number] Date of Birth/Sex: 02-04-1961 (58 y.o. M) Treating RN: [name] Primary Care Marcine Gadway: [name] Other Clinician: Referring Solstice Lastinger: [name] Treating Quindarius Cabello/Extender: [name], [name]: 21 Vital Signs Height(in): 74 Pulse(bpm): 70 Weight(lbs): 221 Blood Pressure(mmHg): 142/73 Body Mass Index(BMI): 28 Temperature(F): 97.7 Respiratory Rate(breaths/min): 18 Photos: [N/A:N/A] Wound Location: Right, Distal, Lateral Lower Leg N/A N/A Wounding Event: Gradually Appeared N/A N/A Primary Etiology: Venous Leg Ulcer N/A N/A Comorbid History: Sleep Apnea, Congestive Heart N/A N/A Failure, Coronary Artery Disease, Hypertension, Peripheral Venous Disease, Type II Diabetes, History of Burn, Osteoarthritis, Neuropathy Date Acquired: 08/31/2019 N/A N/A Weeks of Treatment: 15 N/A  N/A Wound Status: Healed - Epithelialized N/A N/A Measurements L x W x D (cm) 0x0x0 N/A N/A Area (cm) : 0 N/A N/A Volume (cm) : 0 N/A N/A % Reduction in Area: 100.00% N/A N/A % Reduction in Volume: 100.00% N/A N/A Classification: Full Thickness Without Exposed N/A N/A Support Structures Exudate Amount: None Present N/A N/A Wound Margin: Flat and Intact N/A N/A Granulation Amount: None Present (0%) N/A N/A Necrotic Amount: Large (67-100%) N/A N/A Necrotic Tissue: Eschar N/A N/A Exposed Structures: Fat Layer (Subcutaneous Tissue) N/A N/A Exposed: Yes Fascia: No Tendon:  No Muscle: No Joint: No Bone: No Epithelialization: Large (67-100%) N/A N/A Treatment Notes Electronic Signature(s) Signed: 12/16/2019 4:00:14 PM By: [name] Entered By: [name] on 12/16/2019 10:57:06 [name] [name] ([id number]) -------------------------------------------------------------------------------- [place] Details Patient Name: [name] [name] Date of Service: 12/16/2019 10:30 AM Medical Record Number: [id number] Patient Account Number: [id number] Date of Birth/Sex: 04-05-1961 (58 y.o. M) Treating RN: [name] Primary Care Bulmaro Feagans: [name] Other Clinician: Referring Ace Bergfeld: [name] Treating Gay Moncivais/Extender: [name], [name]: 21 Active Inactive Electronic Signature(s) Signed: 12/16/2019 4:00:14 PM By: [name] Entered By: [name] on 12/16/2019 10:56:56 [name] [name] ([id number]) -------------------------------------------------------------------------------- Pain Assessment Details Patient Name: [name] [name] Date of Service: 12/16/2019 10:30 AM Medical Record Number: [id number] Patient Account Number: [id number] Date of Birth/Sex: 1961/03/14 (58 y.o. M) Treating RN: Cornell Barman Primary Care Kathren Scearce: [name] Other Clinician: Referring Blondie Riggsbee: [name] Treating Navina Wohlers/Extender: [name], [name] Weeks in  Treatment: 21 Active Problems Location of Pain Severity and Description of Pain Patient Has Paino No Site Locations Pain Management and Medication Current Pain Management: Electronic Signature(s) Signed: 12/16/2019 6:25:29 PM By: Gretta Cool, BSN, RN, CWS, Kim RN, BSN Entered By: Gretta Cool, BSN, RN, CWS, Kim on 12/16/2019 10:47:31 [name] [name] ([id number]) -------------------------------------------------------------------------------- Patient/Caregiver Education Details Patient Name: [name] [name] Date of Service: 12/16/2019 10:30 AM Medical Record Number: [id number] Patient Account Number: [id number] Date of Birth/Gender: [birth year] (58 y.o. M) Treating RN: [name] Primary Care Physician: [name] Other Clinician: Referring Physician: [name] Treating Physician/Extender: Sharalyn Ink in Treatment: 21 Education Assessment Education Provided To: Patient Education Topics Provided Wound/Skin Impairment: Handouts: Caring for Your Ulcer Methods: Demonstration, Explain/Verbal Responses: State content correctly Electronic Signature(s) Signed: 12/16/2019 4:00:14 PM By: [name] Entered By: [name] on 12/16/2019 10:58:44 [name] [name] ([id number]) -------------------------------------------------------------------------------- Wound Assessment Details Patient Name: [name] [name] Date of Service: 12/16/2019 10:30 AM Medical Record Number: [id number] Patient Account Number: [id number] Date of Birth/Sex: 04-17-1961 (58 y.o. M) Treating RN: [name] Primary Care Ellawyn Wogan: [name] Other Clinician: Referring Djibril Glogowski: [name] Treating Znya Albino/Extender: [name], [name]: 21 Wound Status Wound Number: 3 Primary Venous Leg Ulcer Etiology: Wound Location: Right, Distal, Lateral Lower Leg Wound Healed - Epithelialized Wounding Event: Gradually Appeared Status: Date Acquired: 08/31/2019 Comorbid Sleep Apnea, Congestive Heart  Failure, Coronary Artery Weeks Of Treatment: 15 History: Disease, Hypertension, Peripheral Venous Disease, Type II Clustered Wound: No Diabetes, History of Burn, Osteoarthritis, Neuropathy Photos Wound Measurements Length: (cm) 0 Width: (cm) 0 Depth: (cm) 0 Area: (cm) Volume: (cm) % Reduction in Area: 100% % Reduction in Volume: 100% Epithelialization: Large (67-100%) 0 0 Wound Description Classification: Full Thickness Without Exposed Support Structures Wound Margin: Flat and Intact Exudate Amount: None Present Foul Odor After Cleansing: No Slough/Fibrino No Wound Bed Granulation Amount: None Present (0%) Exposed Structure Necrotic Amount: Large (67-100%) Fascia Exposed: No Necrotic Quality: Eschar Fat Layer (Subcutaneous Tissue) Exposed: Yes Tendon Exposed: No Muscle Exposed: No Joint Exposed: No Bone Exposed: No Electronic Signature(s) Signed: 12/16/2019 4:00:14 PM By: [name] Entered By: [name] on 12/16/2019 10:56:43 [name] [name] ([id number]) -------------------------------------------------------------------------------- Vitals Details Patient Name: [name] [name] Date of Service: 12/16/2019 10:30 AM Medical Record Number: [id number] Patient Account Number: [id number] Date of Birth/Sex: 1960/08/21 (60 y.o. M) Treating RN: Cornell Barman Primary Care Yamil Oelke: [name] Other Clinician: Referring Kinser Fellman: [name] Treating Joycelynn Fritsche/Extender: [name], [name]: 21 Vital Signs Time Taken: 10:46 Temperature (F): 97.7 Height (in): 74 Pulse (bpm): 70 Weight (lbs): 221  Respiratory Rate (breaths/min): 18 Body Mass Index (BMI): 28.4 Blood Pressure (mmHg): 142/73 Reference Range: 80 - 120 mg / dl Electronic Signature(s) Signed: 12/16/2019 6:25:29 PM By: Gretta Cool, BSN, RN, CWS, Kim RN, BSN Entered By: Gretta Cool, BSN, RN, CWS, Kim on 12/16/2019 10:46:48

## 2019-12-24 ENCOUNTER — Telehealth: Payer: Self-pay

## 2019-12-24 ENCOUNTER — Other Ambulatory Visit: Payer: Self-pay

## 2019-12-24 ENCOUNTER — Ambulatory Visit: Payer: Managed Care, Other (non HMO)

## 2019-12-24 DIAGNOSIS — E042 Nontoxic multinodular goiter: Secondary | ICD-10-CM

## 2019-12-24 NOTE — Telephone Encounter (Signed)
Confirmed appointment on 12/28/2019. klh

## 2019-12-28 ENCOUNTER — Other Ambulatory Visit (INDEPENDENT_AMBULATORY_CARE_PROVIDER_SITE_OTHER): Payer: Self-pay | Admitting: Student in an Organized Health Care Education/Training Program

## 2019-12-28 ENCOUNTER — Ambulatory Visit: Payer: Managed Care, Other (non HMO) | Admitting: Adult Health

## 2019-12-28 ENCOUNTER — Other Ambulatory Visit: Payer: Self-pay

## 2019-12-28 ENCOUNTER — Encounter: Payer: Self-pay | Admitting: Adult Health

## 2019-12-28 VITALS — BP 141/80 | HR 63 | Temp 97.6°F | Resp 16 | Ht 74.0 in | Wt 208.0 lb

## 2019-12-28 DIAGNOSIS — Z789 Other specified health status: Secondary | ICD-10-CM | POA: Diagnosis not present

## 2019-12-28 DIAGNOSIS — R6881 Early satiety: Secondary | ICD-10-CM | POA: Diagnosis not present

## 2019-12-28 DIAGNOSIS — K219 Gastro-esophageal reflux disease without esophagitis: Secondary | ICD-10-CM | POA: Diagnosis not present

## 2019-12-28 DIAGNOSIS — E1140 Type 2 diabetes mellitus with diabetic neuropathy, unspecified: Secondary | ICD-10-CM

## 2019-12-28 DIAGNOSIS — Z794 Long term (current) use of insulin: Secondary | ICD-10-CM

## 2019-12-28 MED ORDER — PANTOPRAZOLE SODIUM 40 MG PO TBEC: 40.0000 mg | DELAYED_RELEASE_TABLET | Freq: Every day | ORAL | 0 refills | Status: DC

## 2019-12-28 NOTE — Progress Notes (Signed)
[facility] [place], [place] [address]  Internal MEDICINE  Office Visit Note  Patient Name: [name] [name]  [id number]  [id number]  Date of Service: 12/28/2019  Chief Complaint  Patient presents with  . Follow-up    review labs ,difficulty swallowing food, heart burn  . Shortness of Breath  . Diabetes  . Gastroesophageal Reflux  . Hyperlipidemia  . Hypertension  . Hand Pain  . Dizziness    when standing    HPI  Severe acid reflux, difficulty swallowing, feeling full quicly. Also has upper gastric pain.  dupent contractures.  Hand pain, (fire), contracted at rest, affecting fine motor. 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     Current Medication: Outpatient Encounter Medications as of 12/28/2019  Medication Sig  . Alirocumab (PRALUENT) 75 MG/ML SOAJ Inject 75 mg into the skin every 14 (fourteen) days.  . carvedilol (COREG) 12.5 MG tablet Take 12.5 mg by mouth 2 (two) times daily with a meal.  . ezetimibe (ZETIA) 10 MG tablet Take 10 mg by mouth daily.  . hydrALAZINE (APRESOLINE) 50 MG tablet Take 1 tablet (50 mg total) by mouth 3 (three) times daily.  . insulin degludec (TRESIBA FLEXTOUCH) 100 UNIT/ML SOPN FlexTouch Pen Inject 140 Units into the skin daily.  . insulin lispro (HUMALOG KWIKPEN) 100 UNIT/ML KwikPen Inject into the skin. 0-20 units per sliding scale  . metolazone (ZAROXOLYN) 5 MG tablet Take 5 mg by mouth 2 (two) times a week.  . pantoprazole (PROTONIX) 40 MG tablet Take 40 mg by mouth daily.  Marland Kitchen torsemide (DEMADEX) 20 MG tablet Take 2 tablets (40 mg) by mouth daily in the morning   No facility-administered encounter medications on file as of 12/28/2019.    Surgical History: Past Surgical History:  Procedure Laterality Date  . bone graft surgery    . CARDIAC CATHETERIZATION  2013   S/p PCI   . CARDIAC CATHETERIZATION  2018   S/p CABG  . CARDIAC SURGERY    . CORONARY ARTERY BYPASS GRAFT  2018   (LIMA-LAD,VG-RCA,VG-OM1,VG-D1)  .  EYE SURGERY    . PERIPHERAL VASCULAR CATHETERIZATION Right 10/28/2016   PTA/DEB Right SFA  . WRIST SURGERY      Medical History: Past Medical History:  Diagnosis Date  . Arrhythmia   . Arthritis   . CAD (coronary artery disease)    a. 12/2016 s/p CABG x 4 (LIMA->LAD, VG->RCA, VG->OM1, VG->D1); b. 02/2018 MV: EF 35%, small-med inferolaterlal infarct. No ischemia.  . Colon polyps   . Diabetes ([place])   . Dupre's syndrome   . GERD (gastroesophageal reflux disease)   . HFimpEF (heart failure with improved ejection fraction) ([place])    a. 2018 EF 35%; b. 02/2018 EF 50%; c. 02/2019 EF 40-45%; d. 08/2019 Echo: EF 50-55%, no rwma, Gr2 DD. Nl RV size/fxn. Triv MR. Mild Ao sclerosis.  . Hyperlipidemia   . Hypertension   . Ischemic cardiomyopathy    a. 02/2018 MV: EF 35%; b. 08/2019 Echo: EF 50-55%.  . Lymphedema   . Migraine   . Neuropathy   . PAD (peripheral artery disease) ([place])    a. 04/2017 Aortoiliac duplex: R iliac dzs.  . Peripheral vascular disease ([place])   . Retinopathy   . Sleep apnea   . Stage 3 chronic kidney disease   . Stomach ulcer     Family History: Family History  Problem Relation Age of Onset  . Diabetes Mother   . Heart disease Father  Social History   Socioeconomic History  . Marital status: Married    Spouse name: Not on file  . Number of children: Not on file  . Years of education: Not on file  . Highest education level: Not on file  Occupational History  . Not on file  Tobacco Use  . Smoking status: Former Smoker    Types: Cigarettes    Quit date: 05/18/2012    Years since quitting: 7.6  . Smokeless tobacco: Never Used  Vaping Use  . Vaping Use: Never used  Substance and Sexual Activity  . Alcohol use: Yes    Comment: very rarely  . Drug use: Never  . Sexual activity: Not on file  Other Topics Concern  . Not on file  Social History Narrative  . Not on file   Social Determinants of Health   Financial Resource Strain:   . Difficulty of  Paying Living Expenses:   Food Insecurity:   . Worried About Charity fundraiser in the Last Year:   . Arboriculturist in the Last Year:   Transportation Needs:   . Film/video editor (Medical):   Marland Kitchen Lack of Transportation (Non-Medical):   Physical Activity:   . Days of Exercise per Week:   . Minutes of Exercise per Session:   Stress:   . Feeling of Stress :   Social Connections:   . Frequency of Communication with Friends and Family:   . Frequency of Social Gatherings with Friends and Family:   . Attends Religious Services:   . Active Member of Clubs or Organizations:   . Attends Archivist Meetings:   Marland Kitchen Marital Status:   Intimate Partner Violence:   . Fear of Current or Ex-Partner:   . Emotionally Abused:   Marland Kitchen Physically Abused:   . Sexually Abused:       Review of Systems  Constitutional: Negative.  Negative for chills, fatigue and unexpected weight change.  HENT: Negative.  Negative for congestion, rhinorrhea, sneezing and sore throat.   Eyes: Negative for redness.  Respiratory: Negative.  Negative for cough, chest tightness and shortness of breath.   Cardiovascular: Negative.  Negative for chest pain and palpitations.  Gastrointestinal: Negative.  Negative for abdominal pain, constipation, diarrhea, nausea and vomiting.  Endocrine: Negative.   Genitourinary: Negative.  Negative for dysuria and frequency.  Musculoskeletal: Negative.  Negative for arthralgias, back pain, joint swelling and neck pain.  Skin: Negative.  Negative for rash.  Allergic/Immunologic: Negative.   Neurological: Negative.  Negative for tremors and numbness.  Hematological: Negative for adenopathy. Does not bruise/bleed easily.  Psychiatric/Behavioral: Negative.  Negative for behavioral problems, sleep disturbance and suicidal ideas. The patient is not nervous/anxious.     Vital Signs: BP (!) 141/80   Pulse 63   Temp 97.6 F (36.4 C)   Resp 16   Ht 6\' 2"  (1.88 m)   Wt 208 lb  (94.3 kg)   SpO2 98%   BMI 26.71 kg/m    Physical Exam Vitals and nursing note reviewed.  Constitutional:      General: He is not in acute distress.    Appearance: He is well-developed. He is not diaphoretic.  HENT:     Head: Normocephalic and atraumatic.     Mouth/Throat:     Pharynx: No oropharyngeal exudate.  Eyes:     Pupils: Pupils are equal, round, and reactive to light.  Neck:     Thyroid: No thyromegaly.  Vascular: No JVD.     Trachea: No tracheal deviation.  Cardiovascular:     Rate and Rhythm: Normal rate and regular rhythm.     Heart sounds: Normal heart sounds. No murmur heard.  No friction rub. No gallop.   Pulmonary:     Effort: Pulmonary effort is normal. No respiratory distress.     Breath sounds: Normal breath sounds. No wheezing or rales.  Chest:     Chest wall: No tenderness.  Abdominal:     Palpations: Abdomen is soft.     Tenderness: There is no abdominal tenderness. There is no guarding.  Musculoskeletal:        General: Normal range of motion.     Cervical back: Normal range of motion and neck supple.  Lymphadenopathy:     Cervical: No cervical adenopathy.  Skin:    General: Skin is warm and dry.  Neurological:     Mental Status: He is alert and oriented to person, place, and time.     Cranial Nerves: No cranial nerve deficit.  Psychiatric:        Behavior: Behavior normal.        Thought Content: Thought content normal.        Judgment: Judgment normal.    Assessment/Plan: 1. Early satiety Get upper GI and follow up accordingly - DG UGI W SINGLE CM (SOL OR THIN BA); Future  2. Type 2 diabetes mellitus with diabetic neuropathy, with long-term current use of insulin (HCC) Continue to follow with endocrine as before.   3. Statin intolerance Not currently taking statins.   4. Gastroesophageal reflux disease without esophagitis Refilled protonix. - pantoprazole (PROTONIX) 40 MG tablet; Take 1 tablet (40 mg total) by mouth daily.   Dispense: 30 tablet; Refill: 0  General Counseling: fox salminen understanding of the findings of todays visit and agrees with plan of treatment. I have discussed any further diagnostic evaluation that may be needed or ordered today. We also reviewed his medications today. he has been encouraged to call the office with any questions or concerns that should arise related to todays visit.    Orders Placed This Encounter  Procedures  . DG UGI W SINGLE CM (SOL OR THIN BA)    No orders of the defined types were placed in this encounter.   Time spent: 30 Minutes   This patient was seen by Orson Gear AGNP-C in Collaboration with Dr [name] as a part of collaborative care agreement     Kendell Bane AGNP-C Internal medicine

## 2020-01-02 ENCOUNTER — Other Ambulatory Visit (INDEPENDENT_AMBULATORY_CARE_PROVIDER_SITE_OTHER): Payer: Self-pay | Admitting: Cardiology

## 2020-01-02 ENCOUNTER — Other Ambulatory Visit: Payer: Self-pay | Admitting: Adult Health

## 2020-01-02 DIAGNOSIS — E7849 Other hyperlipidemia: Secondary | ICD-10-CM

## 2020-01-04 ENCOUNTER — Ambulatory Visit
Admission: RE | Admit: 2020-01-04 | Discharge: 2020-01-04 | Disposition: A | Discharge status: Home / Self Care | Payer: Managed Care, Other (non HMO) | Source: Ambulatory Visit | Attending: Adult Health | Admitting: Adult Health

## 2020-01-04 ENCOUNTER — Other Ambulatory Visit: Payer: Self-pay

## 2020-01-04 DIAGNOSIS — R6881 Early satiety: Secondary | ICD-10-CM | POA: Insufficient documentation

## 2020-01-18 ENCOUNTER — Encounter: Payer: Self-pay | Admitting: Hospice and Palliative Medicine

## 2020-01-18 ENCOUNTER — Ambulatory Visit (INDEPENDENT_AMBULATORY_CARE_PROVIDER_SITE_OTHER): Payer: Managed Care, Other (non HMO) | Admitting: Hospice and Palliative Medicine

## 2020-01-18 ENCOUNTER — Other Ambulatory Visit: Payer: Self-pay

## 2020-01-18 DIAGNOSIS — K219 Gastro-esophageal reflux disease without esophagitis: Secondary | ICD-10-CM | POA: Diagnosis not present

## 2020-01-18 DIAGNOSIS — L989 Disorder of the skin and subcutaneous tissue, unspecified: Secondary | ICD-10-CM

## 2020-01-18 DIAGNOSIS — R29898 Other symptoms and signs involving the musculoskeletal system: Secondary | ICD-10-CM

## 2020-01-18 NOTE — Progress Notes (Signed)
[facility] [place], [place] [address]  Internal MEDICINE  Office Visit Note  Patient Name: [name] [name]  [id number]  [id number]  Date of Service: 01/18/2020  Chief Complaint  Patient presents with  . Follow-up    Upper GI results  . spot on right lower arm    noticed a few months ago    HPI Patient is here today for routine follow-up. Has multiple complaints today to discuss, has history with non-adherence to medical therapies. Recent UGI study for swallowing issues as well as gastric reflux symptoms. UGI revealed normal swallowing of barium, no aspiration, no evidence of mass or stricture in esophagus, no dysmotility and mild reflux. Stomach and duodenum unremarkable. No delay in gastric emptying. He continues to have issues with food getting stuck causing him to vomit. He is unable to lie down completely without burning sensation causing cough and shortness of breath. Started on Protonix, has not noticed any improvement in his symptoms. Further symptoms today consist of being unable to ambulate long distances due to "his legs stop working." He is unable to stand for long periods of time without becoming unsteady. Has abnormal lesion on right forearm that he says is new and over the last month has grown and changed color. No pain or itching associated with lesion. BP and HR stable today  Current Medication: Outpatient Encounter Medications as of 01/18/2020  Medication Sig  . Alirocumab (PRALUENT) 75 MG/ML SOAJ Inject 75 mg into the skin every 14 (fourteen) days.  . carvedilol (COREG) 12.5 MG tablet Take 12.5 mg by mouth 2 (two) times daily with a meal.  . ezetimibe (ZETIA) 10 MG tablet TAKE 1 TABLET BY MOUTH EVERY DAY  . hydrALAZINE (APRESOLINE) 50 MG tablet Take 1 tablet (50 mg total) by mouth 3 (three) times daily.  . insulin degludec (TRESIBA FLEXTOUCH) 100 UNIT/ML SOPN FlexTouch Pen Inject 140 Units into the skin daily.  . insulin lispro (HUMALOG  KWIKPEN) 100 UNIT/ML KwikPen Inject into the skin. 0-20 units per sliding scale  . metolazone (ZAROXOLYN) 5 MG tablet Take 5 mg by mouth 2 (two) times a week. As needed for leg swelling  . pantoprazole (PROTONIX) 40 MG tablet Take 1 tablet (40 mg total) by mouth daily.  Marland Kitchen torsemide (DEMADEX) 20 MG tablet Take 2 tablets (40 mg) by mouth daily in the morning   No facility-administered encounter medications on file as of 01/18/2020.    Surgical History: Past Surgical History:  Procedure Laterality Date  . bone graft surgery    . CARDIAC CATHETERIZATION  2013   S/p PCI   . CARDIAC CATHETERIZATION  2018   S/p CABG  . CARDIAC SURGERY    . CORONARY ARTERY BYPASS GRAFT  2018   (LIMA-LAD,VG-RCA,VG-OM1,VG-D1)  . EYE SURGERY    . PERIPHERAL VASCULAR CATHETERIZATION Right 10/28/2016   PTA/DEB Right SFA  . WRIST SURGERY      Medical History: Past Medical History:  Diagnosis Date  . Arrhythmia   . Arthritis   . CAD (coronary artery disease)    a. 12/2016 s/p CABG x 4 (LIMA->LAD, VG->RCA, VG->OM1, VG->D1); b. 02/2018 MV: EF 35%, small-med inferolaterlal infarct. No ischemia.  . Colon polyps   . Diabetes ([place])   . Dupre's syndrome   . GERD (gastroesophageal reflux disease)   . HFimpEF (heart failure with improved ejection fraction) ([place])    a. 2018 EF 35%; b. 02/2018 EF 50%; c. 02/2019 EF 40-45%; d. 08/2019 Echo: EF 50-55%, no rwma, Gr2  DD. Nl RV size/fxn. Triv MR. Mild Ao sclerosis.  . Hyperlipidemia   . Hypertension   . Ischemic cardiomyopathy    a. 02/2018 MV: EF 35%; b. 08/2019 Echo: EF 50-55%.  . Lymphedema   . Migraine   . Neuropathy   . PAD (peripheral artery disease) ([place])    a. 04/2017 Aortoiliac duplex: R iliac dzs.  . Peripheral vascular disease ([place])   . Retinopathy   . Sleep apnea   . Stage 3 chronic kidney disease   . Stomach ulcer     Family History: Family History  Problem Relation Age of Onset  . Diabetes Mother   . Heart disease Father     Social History    Socioeconomic History  . Marital status: Married    Spouse name: Not on file  . Number of children: Not on file  . Years of education: Not on file  . Highest education level: Not on file  Occupational History  . Not on file  Tobacco Use  . Smoking status: Former Smoker    Types: Cigarettes    Quit date: 05/18/2012    Years since quitting: 7.6  . Smokeless tobacco: Never Used  Vaping Use  . Vaping Use: Never used  Substance and Sexual Activity  . Alcohol use: Yes    Comment: very rarely  . Drug use: Never  . Sexual activity: Not on file  Other Topics Concern  . Not on file  Social History Narrative  . Not on file   Social Determinants of Health   Financial Resource Strain:   . Difficulty of Paying Living Expenses: Not on file  Food Insecurity:   . Worried About Charity fundraiser in the Last Year: Not on file  . Ran Out of Food in the Last Year: Not on file  Transportation Needs:   . Lack of Transportation (Medical): Not on file  . Lack of Transportation (Non-Medical): Not on file  Physical Activity:   . Days of Exercise per Week: Not on file  . Minutes of Exercise per Session: Not on file  Stress:   . Feeling of Stress : Not on file  Social Connections:   . Frequency of Communication with Friends and Family: Not on file  . Frequency of Social Gatherings with Friends and Family: Not on file  . Attends Religious Services: Not on file  . Active Member of Clubs or Organizations: Not on file  . Attends Archivist Meetings: Not on file  . Marital Status: Not on file  Intimate Partner Violence:   . Fear of Current or Ex-Partner: Not on file  . Emotionally Abused: Not on file  . Physically Abused: Not on file  . Sexually Abused: Not on file   Review of Systems  Constitutional: Positive for fatigue. Negative for activity change, chills, diaphoresis and fever.  HENT: Positive for trouble swallowing and voice change. Negative for congestion, sinus pressure,  sinus pain and sore throat.   Eyes: Negative for photophobia and visual disturbance.  Respiratory: Positive for cough and choking. Negative for chest tightness, shortness of breath and wheezing.   Cardiovascular: Negative for chest pain, palpitations and leg swelling.  Gastrointestinal: Positive for vomiting. Negative for abdominal distention, constipation, diarrhea, nausea and rectal pain.  Genitourinary: Negative for dysuria and flank pain.  Musculoskeletal: Positive for arthralgias and gait problem. Negative for joint swelling, myalgias and neck pain.       Bilateral legs "stop working"  Skin: Negative for color  change and rash.       New lesion, grown in size and color change  Allergic/Immunologic: Negative for environmental allergies and food allergies.  Neurological: Positive for weakness. Negative for dizziness, light-headedness, numbness and headaches.  Hematological: Does not bruise/bleed easily.  Psychiatric/Behavioral: Negative for agitation, confusion, dysphoric mood and sleep disturbance. The patient is not nervous/anxious and is not hyperactive.    Vital Signs: BP 132/78   Pulse 77   Temp (!) 97 F (36.1 C)   Resp 16   Ht 6' 2.02" (1.88 m)   Wt 209 lb 3.2 oz (94.9 kg)   SpO2 96%   BMI 26.85 kg/m   Physical Exam Constitutional:      Appearance: He is normal weight. He is ill-appearing.  HENT:     Mouth/Throat:     Mouth: Mucous membranes are moist.     Pharynx: Oropharynx is clear.  Cardiovascular:     Rate and Rhythm: Normal rate and regular rhythm.     Pulses: Normal pulses.     Heart sounds: Normal heart sounds.  Pulmonary:     Effort: Pulmonary effort is normal.     Breath sounds: Normal breath sounds.  Abdominal:     General: Abdomen is flat.     Palpations: Abdomen is soft.  Musculoskeletal:        General: Normal range of motion.     Cervical back: Normal range of motion.     Comments: Requires cane for ambulation assistance  Skin:    Findings:  Lesion present.     Comments: Left forearm nevus, flat with normal boarders and normal size and coloring  Neurological:     General: No focal deficit present.     Mental Status: He is alert and oriented to person, place, and time. Mental status is at baseline.  Psychiatric:        Mood and Affect: Mood normal.        Behavior: Behavior normal.        Thought Content: Thought content normal.    Assessment/Plan: 1. Gastroesophageal reflux disease without esophagitis Recent UGI normal, GERD not being well managed with pantoprazole. Continues to have episodes of food being stuck causing him to choke and vomit. Intense burning sensation after eating, prevents him from being able to lie down. - Ambulatory referral to Gastroenterology  2. Weakness of both lower extremities Episodes of legs unable to ambulate due to weakness. Refused ABI today to assess for potential DVT. Will require further follow-up for evaluation and management. May need therapy for neuropathy in the presence of uncontrolled Type 2 diabetes.  3. Skin lesion of right arm Requests to be seen by Dermatology for lesion on left forearm. Advised to wear SPF daily due to his fair skin tone increasing his risk of melanoma. - Ambulatory referral to Dermatology  General Counseling: aasim restivo understanding of the findings of todays visit and agrees with plan of treatment. I have discussed any further diagnostic evaluation that may be needed or ordered today. We also reviewed his medications today. he has been encouraged to call the office with any questions or concerns that should arise related to todays visit.    Orders Placed This Encounter  Procedures  . Ambulatory referral to Gastroenterology  . Ambulatory referral to Dermatology   Time spent: 35 Minutes Time spent includes review of chart, medications, test results and follow-up plan with the patient.  This patient was seen by Theodoro Grist AGNP-C in Collaboration  with Dr  [name] as a part of collaborative care agreement     [employer]. Tameika Heckmann AGNP-C Internal medicine

## 2020-01-19 ENCOUNTER — Other Ambulatory Visit: Payer: Self-pay | Admitting: Internal Medicine

## 2020-01-19 MED ORDER — METOCLOPRAMIDE HCL 5 MG PO TABS
ORAL_TABLET | ORAL | 2 refills | Status: DC
Start: 2020-01-19 — End: 2022-01-09

## 2020-02-02 ENCOUNTER — Telehealth (INDEPENDENT_AMBULATORY_CARE_PROVIDER_SITE_OTHER): Payer: Self-pay

## 2020-02-02 NOTE — Telephone Encounter (Signed)
Following up on pending prior authorizations. Called pt. Pt states his insurance changed and has been taking Praluent. Pt added that he has moved to [facility] and no longer a [facility] patient. [id number] prior authorization no longer needed form [employer]. No further action taken.

## 2020-02-11 ENCOUNTER — Other Ambulatory Visit (INDEPENDENT_AMBULATORY_CARE_PROVIDER_SITE_OTHER): Payer: Self-pay | Admitting: Adult Health

## 2020-02-17 ENCOUNTER — Telehealth: Payer: Self-pay

## 2020-02-17 NOTE — Telephone Encounter (Signed)
Patient has been advised to locate his original PSG very 1st study he had done before getting established here, I was unable to locate it and he will not be able to get the cpap machine until we have copies of his PSG.  Patient also inquiring about dme scooter, he will need a 30 day appointment face to face note ordering the scooter. beth

## 2020-02-18 ENCOUNTER — Encounter: Payer: Self-pay | Admitting: Adult Health

## 2020-02-18 ENCOUNTER — Ambulatory Visit: Payer: Managed Care, Other (non HMO) | Admitting: Adult Health

## 2020-02-18 ENCOUNTER — Other Ambulatory Visit: Payer: Self-pay

## 2020-02-18 VITALS — BP 110/55 | HR 76 | Temp 98.0°F | Resp 16 | Ht 74.0 in | Wt 208.6 lb

## 2020-02-18 DIAGNOSIS — Z794 Long term (current) use of insulin: Secondary | ICD-10-CM | POA: Diagnosis not present

## 2020-02-18 DIAGNOSIS — E1140 Type 2 diabetes mellitus with diabetic neuropathy, unspecified: Secondary | ICD-10-CM

## 2020-02-18 DIAGNOSIS — M720 Palmar fascial fibromatosis [Dupuytren]: Secondary | ICD-10-CM | POA: Diagnosis not present

## 2020-02-18 DIAGNOSIS — G629 Polyneuropathy, unspecified: Secondary | ICD-10-CM | POA: Diagnosis not present

## 2020-02-18 DIAGNOSIS — K219 Gastro-esophageal reflux disease without esophagitis: Secondary | ICD-10-CM

## 2020-02-18 LAB — POCT GLYCOSYLATED HEMOGLOBIN (HGB A1C): Hemoglobin A1C: 14 % — AB (ref 4.0–5.6)

## 2020-02-18 NOTE — Progress Notes (Signed)
[facility] [place], [place] [address]  Internal MEDICINE  Office Visit Note  Patient Name: [name] [name]  [id number]  [id number]  Date of Service: 02/18/2020  Chief Complaint  Patient presents with  . Follow-up    pain in hands and legs increasing, difficult to ignore   . Diabetes  . Hyperlipidemia  . Hypertension  . Quality Metric Gaps    HepC, TDAP    HPI  Pt is here for follow up on his decreased ability to ambulate as well as his hand pain. He reports a history of Dupuytrens Contracture, at this time he continues to not want intervention.   His HTN is well managed, and his DM is managed by endocrine.  His blood pressure appears stable at this time.     Current Medication: Outpatient Encounter Medications as of 02/18/2020  Medication Sig  . Alirocumab (PRALUENT) 75 MG/ML SOAJ Inject 75 mg into the skin every 14 (fourteen) days.  . carvedilol (COREG) 12.5 MG tablet Take 12.5 mg by mouth 2 (two) times daily with a meal.  . ezetimibe (ZETIA) 10 MG tablet TAKE 1 TABLET BY MOUTH EVERY DAY  . fluticasone furoate-vilanterol (BREO ELLIPTA) 100-25 MCG/INH AEPB Inhale 1 puff into the lungs daily.  . hydrALAZINE (APRESOLINE) 50 MG tablet Take 1 tablet (50 mg total) by mouth 3 (three) times daily.  . insulin degludec (TRESIBA FLEXTOUCH) 100 UNIT/ML SOPN FlexTouch Pen Inject 140 Units into the skin daily.  . insulin lispro (HUMALOG KWIKPEN) 100 UNIT/ML KwikPen Inject into the skin. 0-20 units per sliding scale  . metoCLOPramide (REGLAN) 5 MG tablet One tab before each meal and one at bed time for dyspepsia prn  . metolazone (ZAROXOLYN) 5 MG tablet Take 5 mg by mouth 2 (two) times a week. As needed for leg swelling  . pantoprazole (PROTONIX) 40 MG tablet Take 1 tablet (40 mg total) by mouth daily.  Marland Kitchen torsemide (DEMADEX) 20 MG tablet Take 2 tablets (40 mg) by mouth daily in the morning   No facility-administered encounter medications on file as of 02/18/2020.     Surgical History: Past Surgical History:  Procedure Laterality Date  . bone graft surgery    . CARDIAC CATHETERIZATION  2013   S/p PCI   . CARDIAC CATHETERIZATION  2018   S/p CABG  . CARDIAC SURGERY    . CORONARY ARTERY BYPASS GRAFT  2018   (LIMA-LAD,VG-RCA,VG-OM1,VG-D1)  . EYE SURGERY    . PERIPHERAL VASCULAR CATHETERIZATION Right 10/28/2016   PTA/DEB Right SFA  . WRIST SURGERY      Medical History: Past Medical History:  Diagnosis Date  . Arrhythmia   . Arthritis   . CAD (coronary artery disease)    a. 12/2016 s/p CABG x 4 (LIMA->LAD, VG->RCA, VG->OM1, VG->D1); b. 02/2018 MV: EF 35%, small-med inferolaterlal infarct. No ischemia.  . Colon polyps   . Diabetes ([place])   . Dupre's syndrome   . GERD (gastroesophageal reflux disease)   . HFimpEF (heart failure with improved ejection fraction) ([place])    a. 2018 EF 35%; b. 02/2018 EF 50%; c. 02/2019 EF 40-45%; d. 08/2019 Echo: EF 50-55%, no rwma, Gr2 DD. Nl RV size/fxn. Triv MR. Mild Ao sclerosis.  . Hyperlipidemia   . Hypertension   . Ischemic cardiomyopathy    a. 02/2018 MV: EF 35%; b. 08/2019 Echo: EF 50-55%.  . Lymphedema   . Migraine   . Neuropathy   . PAD (peripheral artery disease) ([place])    a.  04/2017 Aortoiliac duplex: R iliac dzs.  . Peripheral vascular disease ([place])   . Retinopathy   . Sleep apnea   . Stage 3 chronic kidney disease   . Stomach ulcer     Family History: Family History  Problem Relation Age of Onset  . Diabetes Mother   . Heart disease Father     Social History   Socioeconomic History  . Marital status: Married    Spouse name: Not on file  . Number of children: Not on file  . Years of education: Not on file  . Highest education level: Not on file  Occupational History  . Not on file  Tobacco Use  . Smoking status: Former Smoker    Types: Cigarettes    Quit date: 05/18/2012    Years since quitting: 7.7  . Smokeless tobacco: Never Used  Vaping Use  . Vaping Use: Never used   Substance and Sexual Activity  . Alcohol use: Yes    Comment: very rarely  . Drug use: Never  . Sexual activity: Not on file  Other Topics Concern  . Not on file  Social History Narrative  . Not on file   Social Determinants of Health   Financial Resource Strain:   . Difficulty of Paying Living Expenses: Not on file  Food Insecurity:   . Worried About Charity fundraiser in the Last Year: Not on file  . Ran Out of Food in the Last Year: Not on file  Transportation Needs:   . Lack of Transportation (Medical): Not on file  . Lack of Transportation (Non-Medical): Not on file  Physical Activity:   . Days of Exercise per Week: Not on file  . Minutes of Exercise per Session: Not on file  Stress:   . Feeling of Stress : Not on file  Social Connections:   . Frequency of Communication with Friends and Family: Not on file  . Frequency of Social Gatherings with Friends and Family: Not on file  . Attends Religious Services: Not on file  . Active Member of Clubs or Organizations: Not on file  . Attends Archivist Meetings: Not on file  . Marital Status: Not on file  Intimate Partner Violence:   . Fear of Current or Ex-Partner: Not on file  . Emotionally Abused: Not on file  . Physically Abused: Not on file  . Sexually Abused: Not on file      Review of Systems  Constitutional: Negative.  Negative for chills, fatigue and unexpected weight change.  HENT: Negative.  Negative for congestion, rhinorrhea, sneezing and sore throat.   Eyes: Negative for redness.  Respiratory: Negative.  Negative for cough, chest tightness and shortness of breath.   Cardiovascular: Negative.  Negative for chest pain and palpitations.  Gastrointestinal: Negative.  Negative for abdominal pain, constipation, diarrhea, nausea and vomiting.  Endocrine: Negative.   Genitourinary: Negative.  Negative for dysuria and frequency.  Musculoskeletal: Negative.  Negative for arthralgias, back pain, joint  swelling and neck pain.  Skin: Negative.  Negative for rash.  Allergic/Immunologic: Negative.   Neurological: Negative.  Negative for tremors and numbness.  Hematological: Negative for adenopathy. Does not bruise/bleed easily.  Psychiatric/Behavioral: Negative.  Negative for behavioral problems, sleep disturbance and suicidal ideas. The patient is not nervous/anxious.     Vital Signs: BP (!) 110/55   Pulse 76   Temp 98 F (36.7 C)   Resp 16   Ht 6\' 2"  (1.88 m)   Wt 208  lb 9.6 oz (94.6 kg)   SpO2 93%   BMI 26.78 kg/m    Physical Exam Vitals and nursing note reviewed.  Constitutional:      General: He is not in acute distress.    Appearance: He is well-developed. He is not diaphoretic.  HENT:     Head: Normocephalic and atraumatic.     Mouth/Throat:     Pharynx: No oropharyngeal exudate.  Eyes:     Pupils: Pupils are equal, round, and reactive to light.  Neck:     Thyroid: No thyromegaly.     Vascular: No JVD.     Trachea: No tracheal deviation.  Cardiovascular:     Rate and Rhythm: Normal rate and regular rhythm.     Heart sounds: Normal heart sounds. No murmur heard.  No friction rub. No gallop.   Pulmonary:     Effort: Pulmonary effort is normal. No respiratory distress.     Breath sounds: Normal breath sounds. No wheezing or rales.  Chest:     Chest wall: No tenderness.  Abdominal:     Palpations: Abdomen is soft.     Tenderness: There is no abdominal tenderness. There is no guarding.  Musculoskeletal:        General: Normal range of motion.     Cervical back: Normal range of motion and neck supple.  Lymphadenopathy:     Cervical: No cervical adenopathy.  Skin:    General: Skin is warm and dry.  Neurological:     Mental Status: He is alert and oriented to person, place, and time.     Cranial Nerves: No cranial nerve deficit.  Psychiatric:        Behavior: Behavior normal.        Thought Content: Thought content normal.        Judgment: Judgment normal.     Assessment/Plan: 1. Type 2 diabetes mellitus with diabetic neuropathy, with long-term current use of insulin (HCC) A1C is 14.  Once again discussed importance of controlling blood glucose.  Encouraged him to follow up with Endocrinology immediatly  - POCT HgB A1C  2. Gastroesophageal reflux disease without esophagitis Well controlled, continue current RX.   3. Neuropathy Increased symptoms, discussed that controlling blood sugar would likely help with these symptoms.   4. Dupuytren's contracture of both hands Patient does not wish to intervene or see specialist at this time.   General Counseling: dyson sevey understanding of the findings of todays visit and agrees with plan of treatment. I have discussed any further diagnostic evaluation that may be needed or ordered today. We also reviewed his medications today. he has been encouraged to call the office with any questions or concerns that should arise related to todays visit.    Orders Placed This Encounter  Procedures  . POCT HgB A1C    No orders of the defined types were placed in this encounter.   Time spent: 30 Minutes   This patient was seen by Orson Gear AGNP-C in Collaboration with Dr [name] as a part of collaborative care agreement     Kendell Bane AGNP-C Internal medicine

## 2020-02-23 ENCOUNTER — Other Ambulatory Visit (INDEPENDENT_AMBULATORY_CARE_PROVIDER_SITE_OTHER): Payer: Self-pay | Admitting: Adult Health

## 2020-02-29 ENCOUNTER — Encounter: Payer: Self-pay | Admitting: Internal Medicine

## 2020-03-07 ENCOUNTER — Ambulatory Visit: Payer: Managed Care, Other (non HMO) | Admitting: Internal Medicine

## 2020-03-07 ENCOUNTER — Other Ambulatory Visit: Payer: Self-pay

## 2020-03-07 ENCOUNTER — Ambulatory Visit: Payer: 59 | Admitting: Cardiovascular Disease

## 2020-03-07 ENCOUNTER — Encounter: Payer: Self-pay | Admitting: Internal Medicine

## 2020-03-07 VITALS — BP 94/62 | HR 67 | Temp 97.7°F | Resp 16 | Ht 74.0 in | Wt 195.2 lb

## 2020-03-07 DIAGNOSIS — M542 Cervicalgia: Secondary | ICD-10-CM | POA: Diagnosis not present

## 2020-03-07 DIAGNOSIS — Z23 Encounter for immunization: Secondary | ICD-10-CM | POA: Diagnosis not present

## 2020-03-07 MED ORDER — CYCLOBENZAPRINE HCL 10 MG PO TABS: ORAL_TABLET | ORAL | 0 refills | Status: DC

## 2020-03-07 MED ORDER — DICLOFENAC SODIUM 1 % EX GEL: 2.0000 g | Freq: Two times a day (BID) | CUTANEOUS | 1 refills | Status: DC

## 2020-03-07 NOTE — Progress Notes (Signed)
[facility] [place], [place] [address]  Internal MEDICINE  Office Visit Note  Patient Name: [name] [name]  [id number]  [id number]  Date of Service: 03/08/2020  Chief Complaint  Patient presents with  . Acute Visit    flu shot, neck pain for 2 weeks, bones in nect are making odd noise/cracking, pt wants precription for scooter  . Diabetes  . Hyperlipidemia  . Hypertension  . policy update form    received    HPI  Pt is here with c/o neck pain and crackling sound from hs neck, he denies any trauma, twists or turns  Denies any numbness, has never suffered from same condition before   Current Medication: Outpatient Encounter Medications as of 03/07/2020  Medication Sig  . Alirocumab (PRALUENT) 75 MG/ML SOAJ Inject 75 mg into the skin every 14 (fourteen) days.  . carvedilol (COREG) 12.5 MG tablet Take 12.5 mg by mouth 2 (two) times daily with a meal.  . ezetimibe (ZETIA) 10 MG tablet TAKE 1 TABLET BY MOUTH EVERY DAY  . fluticasone furoate-vilanterol (BREO ELLIPTA) 100-25 MCG/INH AEPB Inhale 1 puff into the lungs daily.  . insulin degludec (TRESIBA FLEXTOUCH) 100 UNIT/ML SOPN FlexTouch Pen Inject 140 Units into the skin daily.  . insulin lispro (HUMALOG KWIKPEN) 100 UNIT/ML KwikPen Inject into the skin. 0-20 units per sliding scale  . metoCLOPramide (REGLAN) 5 MG tablet One tab before each meal and one at bed time for dyspepsia prn  . metolazone (ZAROXOLYN) 5 MG tablet Take 5 mg by mouth 2 (two) times a week. As needed for leg swelling  . pantoprazole (PROTONIX) 40 MG tablet Take 1 tablet (40 mg total) by mouth daily.  Marland Kitchen torsemide (DEMADEX) 20 MG tablet Take 2 tablets (40 mg) by mouth daily in the morning  . [DISCONTINUED] hydrALAZINE (APRESOLINE) 50 MG tablet Take 1 tablet (50 mg total) by mouth 3 (three) times daily.  . cyclobenzaprine (FLEXERIL) 10 MG tablet Take one tab at night for neck spasm prn  . diclofenac Sodium (VOLTAREN) 1 % GEL Apply 2 g  topically in the morning and at bedtime. To the neck muscle   No facility-administered encounter medications on file as of 03/07/2020.    Surgical History: Past Surgical History:  Procedure Laterality Date  . bone graft surgery    . CARDIAC CATHETERIZATION  2013   S/p PCI   . CARDIAC CATHETERIZATION  2018   S/p CABG  . CARDIAC SURGERY    . CORONARY ARTERY BYPASS GRAFT  2018   (LIMA-LAD,VG-RCA,VG-OM1,VG-D1)  . EYE SURGERY    . PERIPHERAL VASCULAR CATHETERIZATION Right 10/28/2016   PTA/DEB Right SFA  . WRIST SURGERY      Medical History: Past Medical History:  Diagnosis Date  . Arrhythmia   . Arthritis   . CAD (coronary artery disease)    a. 12/2016 s/p CABG x 4 (LIMA->LAD, VG->RCA, VG->OM1, VG->D1); b. 02/2018 MV: EF 35%, small-med inferolaterlal infarct. No ischemia.  . Colon polyps   . Diabetes ([place])   . Dupre's syndrome   . GERD (gastroesophageal reflux disease)   . HFimpEF (heart failure with improved ejection fraction) ([place])    a. 2018 EF 35%; b. 02/2018 EF 50%; c. 02/2019 EF 40-45%; d. 08/2019 Echo: EF 50-55%, no rwma, Gr2 DD. Nl RV size/fxn. Triv MR. Mild Ao sclerosis.  . Hyperlipidemia   . Hypertension   . Ischemic cardiomyopathy    a. 02/2018 MV: EF 35%; b. 08/2019 Echo: EF 50-55%.  . Lymphedema   .  Migraine   . Neuropathy   . PAD (peripheral artery disease) ([place])    a. 04/2017 Aortoiliac duplex: R iliac dzs.  . Peripheral vascular disease ([place])   . Retinopathy   . Sleep apnea   . Stage 3 chronic kidney disease ([place])   . Stomach ulcer     Family History: Family History  Problem Relation Age of Onset  . Diabetes Mother   . Heart disease Father     Social History   Socioeconomic History  . Marital status: Married    Spouse name: Not on file  . Number of children: Not on file  . Years of education: Not on file  . Highest education level: Not on file  Occupational History  . Not on file  Tobacco Use  . Smoking status: Former Smoker    Types:  Cigarettes    Quit date: 05/18/2012    Years since quitting: 7.8  . Smokeless tobacco: Never Used  Vaping Use  . Vaping Use: Never used  Substance and Sexual Activity  . Alcohol use: Yes    Comment: very rarely  . Drug use: Never  . Sexual activity: Not on file  Other Topics Concern  . Not on file  Social History Narrative  . Not on file   Social Determinants of Health   Financial Resource Strain:   . Difficulty of Paying Living Expenses: Not on file  Food Insecurity:   . Worried About Charity fundraiser in the Last Year: Not on file  . Ran Out of Food in the Last Year: Not on file  Transportation Needs:   . Lack of Transportation (Medical): Not on file  . Lack of Transportation (Non-Medical): Not on file  Physical Activity:   . Days of Exercise per Week: Not on file  . Minutes of Exercise per Session: Not on file  Stress:   . Feeling of Stress : Not on file  Social Connections:   . Frequency of Communication with Friends and Family: Not on file  . Frequency of Social Gatherings with Friends and Family: Not on file  . Attends Religious Services: Not on file  . Active Member of Clubs or Organizations: Not on file  . Attends Archivist Meetings: Not on file  . Marital Status: Not on file  Intimate Partner Violence:   . Fear of Current or Ex-Partner: Not on file  . Emotionally Abused: Not on file  . Physically Abused: Not on file  . Sexually Abused: Not on file      Review of Systems  Constitutional: Negative.   Respiratory: Negative.   Cardiovascular: Negative.   Musculoskeletal: Positive for neck pain and neck stiffness.  Hematological: Negative.     Vital Signs: BP 94/62   Pulse 67   Temp 97.7 F (36.5 C)   Resp 16   Ht 6\' 2"  (1.88 m)   Wt 195 lb 3.2 oz (88.5 kg)   SpO2 97%   BMI 25.06 kg/m    Physical Exam Constitutional:      Appearance: Normal appearance.  HENT:     Head: Normocephalic and atraumatic.  Cardiovascular:     Rate  and Rhythm: Normal rate.     Heart sounds: Normal heart sounds.  Musculoskeletal:     Cervical back: Rigidity and tenderness present.  Lymphadenopathy:     Cervical: No cervical adenopathy.  Neurological:     Mental Status: He is alert.    Assessment/Plan: 1. Cervicalgia Unable to prescribe  oral NSAIDS due to renal disease, pt is also on diuretics, apply topical cream  - diclofenac Sodium (VOLTAREN) 1 % GEL; Apply 2 g topically in the morning and at bedtime. To the neck muscle  Dispense: 150 g; Refill: 1 - cyclobenzaprine (FLEXERIL) 10 MG tablet; Take one tab at night for neck spasm prn  Dispense: 15 tablet; Refill: 0  2. Flu vaccine need - Flu Vaccine MDCK QUAD PF  General Counseling: dejuan elman understanding of the findings of todays visit and agrees with plan of treatment. I have discussed any further diagnostic evaluation that may be needed or ordered today. We also reviewed his medications today. he has been encouraged to call the office with any questions or concerns that should arise related to todays visit.  Orders Placed This Encounter  Procedures  . Flu Vaccine MDCK QUAD PF    Meds ordered this encounter  Medications  . diclofenac Sodium (VOLTAREN) 1 % GEL    Sig: Apply 2 g topically in the morning and at bedtime. To the neck muscle    Dispense:  150 g    Refill:  1  . cyclobenzaprine (FLEXERIL) 10 MG tablet    Sig: Take one tab at night for neck spasm prn    Dispense:  15 tablet    Refill:  0    Total time spent: 25 Minutes Time spent includes review of chart, medications, test results, and follow up plan with the patient.      Dr [name] Internal medicine

## 2020-03-13 ENCOUNTER — Encounter: Payer: Self-pay | Admitting: Emergency Medicine

## 2020-03-13 ENCOUNTER — Other Ambulatory Visit: Payer: Self-pay

## 2020-03-13 ENCOUNTER — Telehealth: Payer: Self-pay

## 2020-03-13 ENCOUNTER — Emergency Department
Admission: EM | Admit: 2020-03-13 | Discharge: 2020-03-13 | Disposition: A | Discharge status: Home / Self Care | Payer: Managed Care, Other (non HMO) | Attending: Emergency Medicine | Admitting: Emergency Medicine

## 2020-03-13 DIAGNOSIS — E1022 Type 1 diabetes mellitus with diabetic chronic kidney disease: Secondary | ICD-10-CM | POA: Diagnosis not present

## 2020-03-13 DIAGNOSIS — E860 Dehydration: Secondary | ICD-10-CM | POA: Diagnosis not present

## 2020-03-13 DIAGNOSIS — R55 Syncope and collapse: Secondary | ICD-10-CM | POA: Diagnosis present

## 2020-03-13 DIAGNOSIS — Z87891 Personal history of nicotine dependence: Secondary | ICD-10-CM | POA: Insufficient documentation

## 2020-03-13 DIAGNOSIS — Z794 Long term (current) use of insulin: Secondary | ICD-10-CM | POA: Diagnosis not present

## 2020-03-13 DIAGNOSIS — N183 Chronic kidney disease, stage 3 unspecified: Secondary | ICD-10-CM | POA: Insufficient documentation

## 2020-03-13 DIAGNOSIS — R739 Hyperglycemia, unspecified: Secondary | ICD-10-CM

## 2020-03-13 DIAGNOSIS — I2510 Atherosclerotic heart disease of native coronary artery without angina pectoris: Secondary | ICD-10-CM | POA: Diagnosis not present

## 2020-03-13 DIAGNOSIS — Z79899 Other long term (current) drug therapy: Secondary | ICD-10-CM | POA: Insufficient documentation

## 2020-03-13 DIAGNOSIS — E876 Hypokalemia: Secondary | ICD-10-CM

## 2020-03-13 DIAGNOSIS — E1040 Type 1 diabetes mellitus with diabetic neuropathy, unspecified: Secondary | ICD-10-CM | POA: Insufficient documentation

## 2020-03-13 DIAGNOSIS — E1065 Type 1 diabetes mellitus with hyperglycemia: Secondary | ICD-10-CM | POA: Insufficient documentation

## 2020-03-13 DIAGNOSIS — I129 Hypertensive chronic kidney disease with stage 1 through stage 4 chronic kidney disease, or unspecified chronic kidney disease: Secondary | ICD-10-CM | POA: Diagnosis not present

## 2020-03-13 DIAGNOSIS — Z951 Presence of aortocoronary bypass graft: Secondary | ICD-10-CM | POA: Diagnosis not present

## 2020-03-13 LAB — BASIC METABOLIC PANEL
Anion gap: 16 — ABNORMAL HIGH (ref 5–15)
Anion gap: 12 (ref 5–15)
BUN: 69 mg/dL — ABNORMAL HIGH (ref 6–20)
BUN: 64 mg/dL — ABNORMAL HIGH (ref 6–20)
CO2: 28 mmol/L (ref 22–32)
CO2: 30 mmol/L (ref 22–32)
Calcium: 9 mg/dL (ref 8.9–10.3)
Calcium: 8.3 mg/dL — ABNORMAL LOW (ref 8.9–10.3)
Chloride: 83 mmol/L — ABNORMAL LOW (ref 98–111)
Chloride: 90 mmol/L — ABNORMAL LOW (ref 98–111)
Creatinine, Ser: 3.13 mg/dL — ABNORMAL HIGH (ref 0.61–1.24)
Creatinine, Ser: 2.75 mg/dL — ABNORMAL HIGH (ref 0.61–1.24)
GFR, Estimated: 20 mL/min — ABNORMAL LOW (ref >60)
GFR, Estimated: 24 mL/min — ABNORMAL LOW (ref >60)
Glucose, Bld: 561 mg/dL (ref 70–99)
Glucose, Bld: 374 mg/dL — ABNORMAL HIGH (ref 70–99)
Potassium: 3.1 mmol/L — ABNORMAL LOW (ref 3.5–5.1)
Potassium: 2.6 mmol/L — CL (ref 3.5–5.1)
Sodium: 127 mmol/L — ABNORMAL LOW (ref 135–145)
Sodium: 132 mmol/L — ABNORMAL LOW (ref 135–145)

## 2020-03-13 LAB — GLUCOSE, CAPILLARY: Glucose-Capillary: 331 mg/dL — ABNORMAL HIGH (ref 70–99)

## 2020-03-13 LAB — BLOOD GAS, VENOUS
Acid-Base Excess: 9.9 mmol/L — ABNORMAL HIGH (ref 0.0–2.0)
Bicarbonate: 35 mmol/L — ABNORMAL HIGH (ref 20.0–28.0)
O2 Saturation: 80.4 %
Patient temperature: 37
pCO2, Ven: 47 mmHg (ref 44.0–60.0)
pH, Ven: 7.48 — ABNORMAL HIGH (ref 7.250–7.430)
pO2, Ven: 41 mmHg (ref 32.0–45.0)

## 2020-03-13 LAB — CBC
HCT: 41.4 % (ref 39.0–52.0)
Hemoglobin: 14.9 g/dL (ref 13.0–17.0)
MCH: 30.8 pg (ref 26.0–34.0)
MCHC: 36 g/dL (ref 30.0–36.0)
MCV: 85.5 fL (ref 80.0–100.0)
Platelets: 334 10*3/uL (ref 150–400)
RBC: 4.84 MIL/uL (ref 4.22–5.81)
RDW: 12.8 % (ref 11.5–15.5)
WBC: 10 10*3/uL (ref 4.0–10.5)
nRBC: 0 % (ref 0.0–0.2)

## 2020-03-13 MED ORDER — INSULIN ASPART 100 UNIT/ML ~~LOC~~ SOLN
10.0000 [IU] | Freq: Once | SUBCUTANEOUS | Status: AC
  Administered 2020-03-13: 10 [IU] via INTRAVENOUS
  Filled 2020-03-13: qty 1

## 2020-03-13 MED ORDER — SODIUM CHLORIDE 0.9 % IV BOLUS
1000.0000 mL | Freq: Once | INTRAVENOUS | Status: AC
  Administered 2020-03-13: 1000 mL via INTRAVENOUS

## 2020-03-13 MED ORDER — POTASSIUM CHLORIDE CRYS ER 20 MEQ PO TBCR
40.0000 meq | EXTENDED_RELEASE_TABLET | Freq: Once | ORAL | Status: AC
  Administered 2020-03-13: 40 meq via ORAL
  Filled 2020-03-13: qty 2

## 2020-03-13 MED ORDER — POTASSIUM CHLORIDE 10 MEQ/100ML IV SOLN
10.0000 meq | Freq: Once | INTRAVENOUS | Status: AC
  Administered 2020-03-13: 10 meq via INTRAVENOUS
  Filled 2020-03-13: qty 100

## 2020-03-13 NOTE — ED Triage Notes (Signed)
States passed out twice yesterday.  C/O dizziness for a while.  Also c/o ringing in ears  Has history of tinnitus, but it had resolved 'for a while' and returned yesterday.

## 2020-03-13 NOTE — Discharge Instructions (Signed)
Please continue your normal medications at home and check your blood sugar levels.  Follow-up with your PCP within the next week to be seen in their clinic.  Return to the ED with any worsening symptoms.

## 2020-03-13 NOTE — ED Provider Notes (Signed)
[facility] Emergency Department Provider Note  Time seen: 2:14 PM  I have reviewed the triage vital signs and the nursing notes.   HISTORY  Chief Complaint Loss of Consciousness   HPI [name] [name] is a 59 y.o. male with a past medical history of arthritis, CAD, diabetes, hypertension, hyperlipidemia, CKD presents to the emergency department after 2 syncopal episodes yesterday.  According to the patient yesterday he passed out twice.  States he had not been feeling well all day, states his blood sugars have been running very high at home.  States he has uncontrolled diabetes at baseline has been taking his insulin as prescribed.  Patient states he has been feeling dehydrated despite drinking plenty of fluids or attempting to.   Patient denies any chest pain or trouble breathing.  No abdominal pain.  States diarrhea but that is typical for him per patient.  Past Medical History:  Diagnosis Date  . Arrhythmia   . Arthritis   . CAD (coronary artery disease)    a. 12/2016 s/p CABG x 4 (LIMA->LAD, VG->RCA, VG->OM1, VG->D1); b. 02/2018 MV: EF 35%, small-med inferolaterlal infarct. No ischemia.  . Colon polyps   . Diabetes ([place])   . Dupre's syndrome   . GERD (gastroesophageal reflux disease)   . HFimpEF (heart failure with improved ejection fraction) ([place])    a. 2018 EF 35%; b. 02/2018 EF 50%; c. 02/2019 EF 40-45%; d. 08/2019 Echo: EF 50-55%, no rwma, Gr2 DD. Nl RV size/fxn. Triv MR. Mild Ao sclerosis.  . Hyperlipidemia   . Hypertension   . Ischemic cardiomyopathy    a. 02/2018 MV: EF 35%; b. 08/2019 Echo: EF 50-55%.  . Lymphedema   . Migraine   . Neuropathy   . PAD (peripheral artery disease) ([place])    a. 04/2017 Aortoiliac duplex: R iliac dzs.  . Peripheral vascular disease ([place])   . Retinopathy   . Sleep apnea   . Stage 3 chronic kidney disease ([place])   . Stomach ulcer     Patient Active Problem List   Diagnosis Date Noted  . PAD (peripheral artery  disease) ([place]) 09/27/2019  . Diabetes ([place]) 09/14/2019  . CKD (chronic kidney disease) stage 3, GFR 30-59 ml/min (HCC) 09/14/2019  . Swelling of limb 09/14/2019  . Lymphedema 09/14/2019  . Chronic obstructive pulmonary disease ([place]) 09/05/2019  . Shortness of breath 09/05/2019  . Essential hypertension 09/05/2019  . Coronary atherosclerosis 11/15/2016  . Heel ulcer due to DM ([place]) 12/09/2012  . Septic olecranon bursitis 10/14/2011  . Cellulitis 10/06/2011  . Depression with anxiety 02/22/2011  . Diabetic peripheral neuropathy associated with type 1 diabetes mellitus ([place]) 02/22/2011  . Migraines 02/22/2011  . Hyperlipidemia, mixed 02/22/2011  . Sleep apnea 11/22/2010  . Coronary artery disease with history of myocardial infarction without history of CABG 10/21/2009    Past Surgical History:  Procedure Laterality Date  . bone graft surgery    . CARDIAC CATHETERIZATION  2013   S/p PCI   . CARDIAC CATHETERIZATION  2018   S/p CABG  . CARDIAC SURGERY    . CORONARY ARTERY BYPASS GRAFT  2018   (LIMA-LAD,VG-RCA,VG-OM1,VG-D1)  . EYE SURGERY    . PERIPHERAL VASCULAR CATHETERIZATION Right 10/28/2016   PTA/DEB Right SFA  . WRIST SURGERY      Prior to Admission medications   Medication Sig Start Date End Date Taking? Authorizing Provider  Alirocumab (PRALUENT) 75 MG/ML SOAJ Inject 75 mg into the skin every 14 (fourteen) days. 10/13/19  Minna Merritts, MD  carvedilol (COREG) 12.5 MG tablet Take 12.5 mg by mouth 2 (two) times daily with a meal.    [provider]  cyclobenzaprine (FLEXERIL) 10 MG tablet Take one tab at night for neck spasm prn 03/07/20   [name], MD  diclofenac Sodium (VOLTAREN) 1 % GEL Apply 2 g topically in the morning and at bedtime. To the neck muscle 03/07/20   [name], MD  ezetimibe (ZETIA) 10 MG tablet TAKE 1 TABLET BY MOUTH EVERY DAY 01/03/20   [name], MD  fluticasone furoate-vilanterol (BREO ELLIPTA) 100-25 MCG/INH AEPB Inhale 1 puff  into the lungs daily.    [provider]  insulin degludec (TRESIBA FLEXTOUCH) 100 UNIT/ML SOPN FlexTouch Pen Inject 140 Units into the skin daily.    [provider]  insulin lispro (HUMALOG KWIKPEN) 100 UNIT/ML KwikPen Inject into the skin. 0-20 units per sliding scale    [provider]  metoCLOPramide (REGLAN) 5 MG tablet One tab before each meal and one at bed time for dyspepsia prn 01/19/20   [name], MD  metolazone (ZAROXOLYN) 5 MG tablet Take 5 mg by mouth 2 (two) times a week. As needed for leg swelling    [provider]  pantoprazole (PROTONIX) 40 MG tablet Take 1 tablet (40 mg total) by mouth daily. 12/28/19   Kendell Bane, NP  torsemide (DEMADEX) 20 MG tablet Take 2 tablets (40 mg) by mouth daily in the morning    [provider]    Allergies  Allergen Reactions  . Other Other (See Comments)    Pain  With joint stiffness    . Statins     Pain  With joint stiffness   . Atorvastatin Other (See Comments)    Muscle aches Muscle aches  . Entresto [Sacubitril-Valsartan]     Hyperkalemia   . Pravastatin Other (See Comments)    Muscle aches Muscle aches  . Pregabalin Other (See Comments)    Generalized aches and pains Generalized aches and pains  . Rosuvastatin Other (See Comments)    Muscle Aches Other reaction(s): JOINT PAIN Other reaction(s): JOINT PAIN Muscle Aches     Family History  Problem Relation Age of Onset  . Diabetes Mother   . Heart disease Father     Social History Social History   Tobacco Use  . Smoking status: Former Smoker    Types: Cigarettes    Quit date: 05/18/2012    Years since quitting: 7.8  . Smokeless tobacco: Never Used  Vaping Use  . Vaping Use: Never used  Substance Use Topics  . Alcohol use: Yes    Comment: very rarely  . Drug use: Never    Review of Systems Constitutional: Negative for fever.  Syncope x2 yesterday. Cardiovascular: Negative for chest pain. Respiratory:  Negative for shortness of breath. Gastrointestinal: Negative for abdominal pain.  Positive for intermittent diarrhea which is chronic. Genitourinary: Negative for urinary compaints Musculoskeletal: Negative for musculoskeletal complaints Neurological: Negative for headache All other ROS negative  ____________________________________________   PHYSICAL EXAM:  VITAL SIGNS: ED Triage Vitals  Enc Vitals Group     BP 03/13/20 1237 137/76     Pulse Rate 03/13/20 1237 76     Resp 03/13/20 1237 16     Temp 03/13/20 1237 98.5 F (36.9 C)     Temp Source 03/13/20 1237 Oral     SpO2 03/13/20 1237 100 %     Weight 03/13/20  1235 195 lb 1.7 oz (88.5 kg)     Height 03/13/20 1235 6\' 2"  (8.10 m)     Head Circumference --      Peak Flow --      Pain Score 03/13/20 1235 0     Pain Loc --      Pain Edu? --      Excl. in [place]? --    Constitutional: Alert and oriented. Well appearing and in no distress. Eyes: Normal exam ENT      Head: Normocephalic and atraumatic.      Mouth/Throat: Dry appearing mucous membranes. Cardiovascular: Normal rate, regular rhythm.  Respiratory: Normal respiratory effort without tachypnea nor retractions. Breath sounds are clear Gastrointestinal: Soft and nontender. No distention.  Musculoskeletal: Nontender with normal range of motion in all extremities. Neurologic:  Normal speech and language. No gross focal neurologic deficits  Skin:  Skin is warm, dry and intact.  Psychiatric: Mood and affect are normal.  ____________________________________________    EKG  EKG viewed and interpreted by myself shows a normal sinus rhythm at 80 bpm with a narrow QRS, normal axis, largely normal intervals with nonspecific ST changes.  ____________________________________________   INITIAL IMPRESSION / ASSESSMENT AND PLAN / ED COURSE  Pertinent labs & imaging results that were available during my care of the patient were reviewed by me and considered in my medical decision  making (see chart for details).   Patient presents to the emergency department for 2 syncopal episodes yesterday and a feeling of generalized fatigue/weakness.  Here the patient appears dry/dehydrated.  Patient's lab work shows significant dehydration as well with a anion gap of 16 and a blood glucose of 561.  Creatinine is slightly elevated compared to baseline as well.  Patient denies any nausea or vomiting.  Denies abdominal pain, benign abdominal exam.  We will check a VBG to ensure the patient is not in DKA.  I suspect hyperglycemia leading to dehydration/hypovolemia as a cause to his syncopal episodes.  We will check a troponin as a precaution.  We will IV hydrate and treat with IV insulin and reassess.  Patient agreeable to plan of care.  We will replete potassium as well.  We will recheck labs after fluids insulin and potassium.  Patient care signed out to oncoming provider.  Leray Garverick was evaluated in Emergency Department on 03/13/2020 for the symptoms described in the history of present illness. He was evaluated in the context of the global COVID-19 pandemic, which necessitated consideration that the patient might be at risk for infection with the SARS-CoV-2 virus that causes COVID-19. Institutional protocols and algorithms that pertain to the evaluation of patients at risk for COVID-19 are in a state of rapid change based on information released by regulatory bodies including the CDC and federal and state organizations. These policies and algorithms were followed during the patient's care in the ED.  ____________________________________________   FINAL CLINICAL IMPRESSION(S) / ED DIAGNOSES  Hyperglycemia Dehydration   Harvest Dark, MD 03/13/20 1526

## 2020-03-13 NOTE — Telephone Encounter (Signed)
Pt called that he passed out twice yesterday and he is so dizzy and cannot walk and his dizziness getting worse and he has ear ringing and when he passed out he hit his head  On the floor but does not have concussion as per dr Humphrey Rolls pt advised by shanell that need to go to ED

## 2020-03-13 NOTE — ED Notes (Signed)
Date and time results received: 03/13/20 5:12 PM  (use smartphrase ".now" to insert current time)  Test: K+ Critical Value: 2.6  Name of Provider Notified: Dr. Tamala Julian   Orders Received? Or Actions Taken?: No new orders at this time

## 2020-03-14 ENCOUNTER — Other Ambulatory Visit: Payer: Self-pay

## 2020-03-15 ENCOUNTER — Other Ambulatory Visit: Payer: Self-pay

## 2020-03-15 ENCOUNTER — Ambulatory Visit: Payer: Managed Care, Other (non HMO) | Admitting: Gastroenterology

## 2020-03-15 ENCOUNTER — Encounter: Payer: Self-pay | Admitting: Gastroenterology

## 2020-03-15 VITALS — BP 154/73 | HR 74 | Ht 74.0 in | Wt 208.0 lb

## 2020-03-15 DIAGNOSIS — R1319 Other dysphagia: Secondary | ICD-10-CM

## 2020-03-15 DIAGNOSIS — K219 Gastro-esophageal reflux disease without esophagitis: Secondary | ICD-10-CM
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
## 2020-03-20 ENCOUNTER — Ambulatory Visit: Payer: Managed Care, Other (non HMO) | Admitting: Nurse Practitioner

## 2020-03-20 ENCOUNTER — Encounter: Payer: Self-pay | Admitting: Nurse Practitioner

## 2020-03-20 ENCOUNTER — Other Ambulatory Visit: Payer: Self-pay

## 2020-03-20 VITALS — BP 138/86 | HR 79 | Temp 97.4°F | Resp 16 | Ht 74.0 in | Wt 208.0 lb

## 2020-03-20 DIAGNOSIS — Z794 Long term (current) use of insulin: Secondary | ICD-10-CM

## 2020-03-20 DIAGNOSIS — M542 Cervicalgia: Secondary | ICD-10-CM

## 2020-03-20 DIAGNOSIS — E1140 Type 2 diabetes mellitus with diabetic neuropathy, unspecified: Secondary | ICD-10-CM

## 2020-03-20 DIAGNOSIS — R55 Syncope and collapse: Secondary | ICD-10-CM | POA: Diagnosis not present

## 2020-03-20 NOTE — Progress Notes (Signed)
[facility] [place], [place] [address]  Internal MEDICINE  Office Visit Note  Patient Name: [name] [name]  [id number]  [id number]  Date of Service: 04/09/2020  Chief Complaint  Patient presents with  . Follow-up  . Diabetes  . Gastroesophageal Reflux  . Hyperlipidemia  . Hypertension  . Quality Metric Gaps    tetnaus,hep C  . controlled substance form    reviewed with PT    The patient is here for follow up. He was recently seen in ER. Was having dizziness which ultimately led to syncopal episode. Labs work in the ER indicated significant hyperglycemia and hypokalemia. He does have Stage IV chronic kidney disease. He was diagnosed with dehydration and given two liters of IV fluids and discharged home. When arriving in ER, his blood sugar was 561 and potassium was 3.1. with fluids, blood sugar came down into 300s and potassium continued to decrease to 2.6. he is not willing to take potassium supplement due to severe kidney disease. He has history of blood sugars being very high and very low. He currently does not see an endocrinologist. He is concerned about balance problems. He states that they have not resolved despite resolution of dehydration and improved blood sugars.       Current Medication: Outpatient Encounter Medications as of 03/20/2020  Medication Sig  . albuterol (ACCUNEB) 0.63 MG/3ML nebulizer solution Inhale into the lungs.  . Alirocumab (PRALUENT) 75 MG/ML SOAJ Inject 75 mg into the skin every 14 (fourteen) days.  . carvedilol (COREG) 12.5 MG tablet Take 12.5 mg by mouth 2 (two) times daily with a meal.  . CLINPRO 5000 1.1 % PSTE Place onto teeth at bedtime.  . cyclobenzaprine (FLEXERIL) 10 MG tablet Take one tab at night for neck spasm prn  . diclofenac Sodium (VOLTAREN) 1 % GEL Apply 2 g topically in the morning and at bedtime. To the neck muscle  . ezetimibe (ZETIA) 10 MG tablet TAKE 1 TABLET BY MOUTH EVERY DAY  . fluticasone  furoate-vilanterol (BREO ELLIPTA) 100-25 MCG/INH AEPB Inhale 1 puff into the lungs daily.  . insulin lispro (HUMALOG KWIKPEN) 100 UNIT/ML KwikPen Inject into the skin. 0-20 units per sliding scale  . metoCLOPramide (REGLAN) 5 MG tablet One tab before each meal and one at bed time for dyspepsia prn  . metolazone (ZAROXOLYN) 5 MG tablet Take 5 mg by mouth 2 (two) times a week. As needed for leg swelling  . torsemide (DEMADEX) 20 MG tablet Take 2 tablets (40 mg) by mouth daily in the morning  . [DISCONTINUED] insulin degludec (TRESIBA FLEXTOUCH) 100 UNIT/ML SOPN FlexTouch Pen Inject 140 Units into the skin daily.  . [DISCONTINUED] pantoprazole (PROTONIX) 40 MG tablet Take 1 tablet (40 mg total) by mouth daily.   No facility-administered encounter medications on file as of 03/20/2020.    Surgical History: Past Surgical History:  Procedure Laterality Date  . bone graft surgery    . CARDIAC CATHETERIZATION  2013   S/p PCI   . CARDIAC CATHETERIZATION  2018   S/p CABG  . CARDIAC SURGERY    . CORONARY ARTERY BYPASS GRAFT  2018   (LIMA-LAD,VG-RCA,VG-OM1,VG-D1)  . ESOPHAGOGASTRODUODENOSCOPY (EGD) WITH PROPOFOL N/A 04/04/2020   Procedure: ESOPHAGOGASTRODUODENOSCOPY (EGD) WITH PROPOFOL;  Surgeon: Lucilla Lame, MD;  Location[facility] ENDOSCOPY;  Service: Endoscopy;  Laterality: N/A;  . EYE SURGERY    . PERIPHERAL VASCULAR CATHETERIZATION Right 10/28/2016   PTA/DEB Right SFA  . [place]  History: Past Medical History:  Diagnosis Date  . Arrhythmia   . Arthritis   . CAD (coronary artery disease)    a. 12/2016 s/p CABG x 4 (LIMA->LAD, VG->RCA, VG->OM1, VG->D1); b. 02/2018 MV: EF 35%, small-med inferolaterlal infarct. No ischemia.  . Colon polyps   . Diabetes ([place])   . Dupre's syndrome   . GERD (gastroesophageal reflux disease)   . HFimpEF (heart failure with improved ejection fraction) ([place])    a. 2018 EF 35%; b. 02/2018 EF 50%; c. 02/2019 EF 40-45%; d. 08/2019 Echo: EF 50-55%, no  rwma, Gr2 DD. Nl RV size/fxn. Triv MR. Mild Ao sclerosis.  . Hyperlipidemia   . Hypertension   . Ischemic cardiomyopathy    a. 02/2018 MV: EF 35%; b. 08/2019 Echo: EF 50-55%.  . Lymphedema   . Migraine   . Neuropathy   . PAD (peripheral artery disease) ([place])    a. 04/2017 Aortoiliac duplex: R iliac dzs.  . Peripheral vascular disease ([place])   . Retinopathy   . Sleep apnea   . Stage 3 chronic kidney disease ([place])   . Stomach ulcer     Family History: Family History  Problem Relation Age of Onset  . Diabetes Mother   . Heart disease Father     Social History   Socioeconomic History  . Marital status: Married    Spouse name: Not on file  . Number of children: Not on file  . Years of education: Not on file  . Highest education level: Not on file  Occupational History  . Not on file  Tobacco Use  . Smoking status: Former Smoker    Types: Cigarettes    Quit date: 05/18/2012    Years since quitting: 7.8  . Smokeless tobacco: Never Used  Vaping Use  . Vaping Use: Never used  Substance and Sexual Activity  . Alcohol use: Yes    Comment: very rarely  . Drug use: Never  . Sexual activity: Not on file  Other Topics Concern  . Not on file  Social History Narrative  . Not on file   Social Determinants of Health   Financial Resource Strain:   . Difficulty of Paying Living Expenses: Not on file  Food Insecurity:   . Worried About Charity fundraiser in the Last Year: Not on file  . Ran Out of Food in the Last Year: Not on file  Transportation Needs:   . Lack of Transportation (Medical): Not on file  . Lack of Transportation (Non-Medical): Not on file  Physical Activity:   . Days of Exercise per Week: Not on file  . Minutes of Exercise per Session: Not on file  Stress:   . Feeling of Stress : Not on file  Social Connections:   . Frequency of Communication with Friends and Family: Not on file  . Frequency of Social Gatherings with Friends and Family: Not on file  .  Attends Religious Services: Not on file  . Active Member of Clubs or Organizations: Not on file  . Attends Archivist Meetings: Not on file  . Marital Status: Not on file  Intimate Partner Violence:   . Fear of Current or Ex-Partner: Not on file  . Emotionally Abused: Not on file  . Physically Abused: Not on file  . Sexually Abused: Not on file      Review of Systems  Constitutional: Positive for activity change and fatigue. Negative for chills and unexpected weight change.  HENT: Negative for  congestion, postnasal drip, rhinorrhea, sneezing and sore throat.   Respiratory: Negative for cough, chest tightness, shortness of breath and wheezing.   Cardiovascular: Negative for chest pain and palpitations.  Gastrointestinal: Positive for nausea. Negative for abdominal pain, constipation, diarrhea and vomiting.  Endocrine: Negative for cold intolerance, heat intolerance, polydipsia and polyuria.       Big fluctuations in blood sugars. States they have never been well controlled.   Musculoskeletal: Positive for arthralgias, back pain, gait problem, myalgias and neck pain. Negative for joint swelling.  Skin: Negative for rash.  Allergic/Immunologic: Negative for environmental allergies.  Neurological: Positive for dizziness, weakness and headaches. Negative for tremors and numbness.  Hematological: Negative for adenopathy. Does not bruise/bleed easily.  Psychiatric/Behavioral: Negative for behavioral problems (Depression), sleep disturbance and suicidal ideas. The patient is not nervous/anxious.     Today's Vitals   03/20/20 1352  BP: 138/86  Pulse: 79  Resp: 16  Temp: (!) 97.4 F (36.3 C)  SpO2: 99%  Weight: 208 lb (94.3 kg)  Height: 6\' 2"  (1.88 m)   Body mass index is 26.71 kg/m.  Physical Exam Vitals and nursing note reviewed.  Constitutional:      General: He is not in acute distress.    Appearance: Normal appearance. He is well-developed. He is not diaphoretic.   HENT:     Head: Normocephalic and atraumatic.     Nose: Nose normal.     Mouth/Throat:     Pharynx: No oropharyngeal exudate.  Eyes:     Pupils: Pupils are equal, round, and reactive to light.  Neck:     Thyroid: No thyromegaly.     Vascular: No carotid bruit or JVD.     Trachea: No tracheal deviation.  Cardiovascular:     Rate and Rhythm: Normal rate and regular rhythm.     Heart sounds: Normal heart sounds. No murmur heard.  No friction rub. No gallop.   Pulmonary:     Effort: Pulmonary effort is normal. No respiratory distress.     Breath sounds: Normal breath sounds. No wheezing or rales.  Chest:     Chest wall: No tenderness.  Abdominal:     Palpations: Abdomen is soft.  Musculoskeletal:        General: Normal range of motion.     Cervical back: Normal range of motion and neck supple. Pain with movement and spinous process tenderness present.  Lymphadenopathy:     Cervical: No cervical adenopathy.  Skin:    General: Skin is warm and dry.     Capillary Refill: Capillary refill takes less than 2 seconds.  Neurological:     General: No focal deficit present.     Mental Status: He is alert and oriented to person, place, and time.     Cranial Nerves: No cranial nerve deficit.     Gait: Gait abnormal.     Comments: Abnormal gait. Using a cane to help with ambulation.   Psychiatric:        Mood and Affect: Mood normal.        Behavior: Behavior normal.        Thought Content: Thought content normal.        Judgment: Judgment normal.    Assessment/Plan: 1. Type 2 diabetes mellitus with diabetic neuropathy, with long-term current use of insulin ([place]) Most recent HgbA1c done 01/2020 and was 14.0. patient still having large fluctuations in blood sugars. Refer to endocrinology for further evaluation and treatment.  - Ambulatory referral to Endocrinology  2. Cervicalgia Will get x-ray of cervical spine.  - DG Cervical Spine Complete; Future  3. Syncope and  collapse Unclear etiology. Will get CT of the head fur further evaluation.  - CT Head Wo Contrast; Future  General Counseling: ridgely anastacio understanding of the findings of todays visit and agrees with plan of treatment. I have discussed any further diagnostic evaluation that may be needed or ordered today. We also reviewed his medications today. he has been encouraged to call the office with any questions or concerns that should arise related to todays visit.  This patient was seen by Leretha Pol FNP Collaboration with Dr [name] as a part of collaborative care agreement  Orders Placed This Encounter  Procedures  . CT Head Wo Contrast  . DG Cervical Spine Complete  . Ambulatory referral to Endocrinology   Total time spent: 35 Minutes   Time spent includes review of chart, medications, test results, and follow up plan with the patient.      Dr [name] Internal medicine

## 2020-03-22 ENCOUNTER — Ambulatory Visit: Payer: Managed Care, Other (non HMO)

## 2020-03-27 ENCOUNTER — Ambulatory Visit
Admission: RE | Admit: 2020-03-27 | Discharge: 2020-03-27 | Disposition: A | Discharge status: Home / Self Care | Payer: Managed Care, Other (non HMO) | Source: Ambulatory Visit | Attending: Nurse Practitioner | Admitting: Nurse Practitioner

## 2020-03-27 ENCOUNTER — Ambulatory Visit
Admission: RE | Admit: 2020-03-27 | Discharge: 2020-03-27 | Disposition: A | Discharge status: Home / Self Care | Payer: Managed Care, Other (non HMO) | Attending: Nurse Practitioner | Admitting: Nurse Practitioner

## 2020-03-27 ENCOUNTER — Other Ambulatory Visit: Payer: Self-pay

## 2020-03-27 DIAGNOSIS — M542 Cervicalgia: Secondary | ICD-10-CM

## 2020-03-29 ENCOUNTER — Other Ambulatory Visit: Payer: Self-pay

## 2020-03-29 ENCOUNTER — Ambulatory Visit: Payer: Managed Care, Other (non HMO)

## 2020-03-29 DIAGNOSIS — G4733 Obstructive sleep apnea (adult) (pediatric): Secondary | ICD-10-CM | POA: Diagnosis not present

## 2020-03-30 NOTE — Progress Notes (Signed)
New CPAP  Setup  CPAP 5-15 with a Simplus full face med

## 2020-03-31 ENCOUNTER — Other Ambulatory Visit: Payer: Self-pay

## 2020-03-31 ENCOUNTER — Other Ambulatory Visit
Admission: RE | Admit: 2020-03-31 | Discharge: 2020-03-31 | Disposition: A | Discharge status: Home / Self Care | Payer: Managed Care, Other (non HMO) | Source: Ambulatory Visit | Attending: Gastroenterology | Admitting: Gastroenterology

## 2020-03-31 ENCOUNTER — Other Ambulatory Visit: Payer: Self-pay | Admitting: Internal Medicine

## 2020-03-31 DIAGNOSIS — K219 Gastro-esophageal reflux disease without esophagitis: Secondary | ICD-10-CM

## 2020-03-31 DIAGNOSIS — Z01812 Encounter for preprocedural laboratory examination: Secondary | ICD-10-CM | POA: Diagnosis present

## 2020-03-31 DIAGNOSIS — Z20822 Contact with and (suspected) exposure to covid-19: Secondary | ICD-10-CM | POA: Insufficient documentation

## 2020-03-31 LAB — SARS CORONAVIRUS 2 (TAT 6-24 HRS): SARS Coronavirus 2: NEGATIVE

## 2020-04-03 ENCOUNTER — Encounter: Payer: Self-pay | Admitting: Gastroenterology

## 2020-04-03 ENCOUNTER — Telehealth: Payer: Self-pay

## 2020-04-03 ENCOUNTER — Other Ambulatory Visit: Payer: Self-pay

## 2020-04-03 MED ORDER — TRESIBA FLEXTOUCH 100 UNIT/ML ~~LOC~~ SOPN: 140.0000 [IU] | PEN_INJECTOR | Freq: Every day | SUBCUTANEOUS | 2 refills | Status: DC

## 2020-04-04 ENCOUNTER — Ambulatory Visit: Payer: Managed Care, Other (non HMO) | Admitting: Anesthesiology

## 2020-04-04 ENCOUNTER — Ambulatory Visit
Admission: RE | Admit: 2020-04-04 | Discharge: 2020-04-04 | Disposition: A | Discharge status: Home / Self Care | Payer: Managed Care, Other (non HMO) | Attending: Gastroenterology | Admitting: Gastroenterology

## 2020-04-04 ENCOUNTER — Encounter: Payer: Self-pay | Admitting: Gastroenterology

## 2020-04-04 ENCOUNTER — Encounter
Admission: RE | Disposition: A | Discharge status: Home / Self Care | Payer: Self-pay | Source: Home / Self Care | Attending: Gastroenterology

## 2020-04-04 DIAGNOSIS — E785 Hyperlipidemia, unspecified: Secondary | ICD-10-CM | POA: Diagnosis not present

## 2020-04-04 DIAGNOSIS — E1142 Type 2 diabetes mellitus with diabetic polyneuropathy: Secondary | ICD-10-CM | POA: Insufficient documentation

## 2020-04-04 DIAGNOSIS — Z794 Long term (current) use of insulin: Secondary | ICD-10-CM | POA: Diagnosis not present

## 2020-04-04 DIAGNOSIS — N183 Chronic kidney disease, stage 3 unspecified: Secondary | ICD-10-CM | POA: Diagnosis not present

## 2020-04-04 DIAGNOSIS — Z8249 Family history of ischemic heart disease and other diseases of the circulatory system: Secondary | ICD-10-CM | POA: Diagnosis not present

## 2020-04-04 DIAGNOSIS — Z86010 Personal history of colonic polyps: Secondary | ICD-10-CM | POA: Diagnosis not present

## 2020-04-04 DIAGNOSIS — Z79899 Other long term (current) drug therapy: Secondary | ICD-10-CM | POA: Insufficient documentation

## 2020-04-04 DIAGNOSIS — I130 Hypertensive heart and chronic kidney disease with heart failure and stage 1 through stage 4 chronic kidney disease, or unspecified chronic kidney disease: Secondary | ICD-10-CM | POA: Insufficient documentation

## 2020-04-04 DIAGNOSIS — Z951 Presence of aortocoronary bypass graft: Secondary | ICD-10-CM | POA: Insufficient documentation

## 2020-04-04 DIAGNOSIS — E1151 Type 2 diabetes mellitus with diabetic peripheral angiopathy without gangrene: Secondary | ICD-10-CM | POA: Insufficient documentation

## 2020-04-04 DIAGNOSIS — Z888 Allergy status to other drugs, medicaments and biological substances status: Secondary | ICD-10-CM | POA: Insufficient documentation

## 2020-04-04 DIAGNOSIS — E1122 Type 2 diabetes mellitus with diabetic chronic kidney disease: Secondary | ICD-10-CM | POA: Diagnosis not present

## 2020-04-04 DIAGNOSIS — R12 Heartburn: Secondary | ICD-10-CM | POA: Diagnosis present

## 2020-04-04 DIAGNOSIS — E11319 Type 2 diabetes mellitus with unspecified diabetic retinopathy without macular edema: Secondary | ICD-10-CM | POA: Diagnosis not present

## 2020-04-04 DIAGNOSIS — Z833 Family history of diabetes mellitus: Secondary | ICD-10-CM | POA: Diagnosis not present

## 2020-04-04 DIAGNOSIS — G4730 Sleep apnea, unspecified: Secondary | ICD-10-CM | POA: Insufficient documentation

## 2020-04-04 DIAGNOSIS — Z7951 Long term (current) use of inhaled steroids: Secondary | ICD-10-CM | POA: Insufficient documentation

## 2020-04-04 DIAGNOSIS — I509 Heart failure, unspecified: Secondary | ICD-10-CM | POA: Diagnosis not present

## 2020-04-04 DIAGNOSIS — Z87891 Personal history of nicotine dependence: Secondary | ICD-10-CM | POA: Diagnosis not present

## 2020-04-04 DIAGNOSIS — I255 Ischemic cardiomyopathy: Secondary | ICD-10-CM | POA: Insufficient documentation

## 2020-04-04 DIAGNOSIS — I2510 Atherosclerotic heart disease of native coronary artery without angina pectoris: Secondary | ICD-10-CM | POA: Diagnosis not present

## 2020-04-04 DIAGNOSIS — Z791 Long term (current) use of non-steroidal anti-inflammatories (NSAID): Secondary | ICD-10-CM | POA: Diagnosis not present

## 2020-04-04 DIAGNOSIS — R1319 Other dysphagia: Secondary | ICD-10-CM

## 2020-04-04 DIAGNOSIS — K2100 Gastro-esophageal reflux disease with esophagitis, without bleeding: Secondary | ICD-10-CM | POA: Diagnosis not present

## 2020-04-04 DIAGNOSIS — K22710 Barrett's esophagus with low grade dysplasia: Secondary | ICD-10-CM | POA: Insufficient documentation

## 2020-04-04 HISTORY — PX: ESOPHAGOGASTRODUODENOSCOPY (EGD) WITH PROPOFOL: SHX5813

## 2020-04-04 LAB — GLUCOSE, CAPILLARY
Glucose-Capillary: 416 mg/dL — ABNORMAL HIGH (ref 70–99)
Glucose-Capillary: 392 mg/dL — ABNORMAL HIGH (ref 70–99)

## 2020-04-04 SURGERY — ESOPHAGOGASTRODUODENOSCOPY (EGD) WITH PROPOFOL
Anesthesia: General

## 2020-04-04 MED ORDER — LIDOCAINE HCL (CARDIAC) PF 100 MG/5ML IV SOSY
PREFILLED_SYRINGE | INTRAVENOUS | Status: DC | PRN
  Administered 2020-04-04: 30 mg via INTRAVENOUS

## 2020-04-04 MED ORDER — PROPOFOL 500 MG/50ML IV EMUL
INTRAVENOUS | Status: DC | PRN
  Administered 2020-04-04: 85 ug/kg/min via INTRAVENOUS

## 2020-04-04 MED ORDER — SODIUM CHLORIDE 0.9 % IV SOLN: INTRAVENOUS | Status: DC

## 2020-04-04 MED ORDER — PROPOFOL 500 MG/50ML IV EMUL
INTRAVENOUS | Status: AC
  Filled 2020-04-04: qty 50

## 2020-04-04 MED ORDER — LIDOCAINE HCL (PF) 2 % IJ SOLN
INTRAMUSCULAR | Status: AC
  Filled 2020-04-04: qty 5

## 2020-04-04 MED ORDER — INSULIN ASPART 100 UNIT/ML ~~LOC~~ SOLN
SUBCUTANEOUS | Status: AC
  Administered 2020-04-04: 10 [IU]
  Filled 2020-04-04: qty 1

## 2020-04-04 MED ORDER — PROPOFOL 10 MG/ML IV BOLUS
INTRAVENOUS | Status: DC | PRN
  Administered 2020-04-04 (×3): 25 mg via INTRAVENOUS

## 2020-04-04 NOTE — Anesthesia Preprocedure Evaluation (Signed)
Anesthesia Evaluation  Patient identified by MRN, date of birth, ID band Patient awake    Reviewed: Allergy & Precautions, NPO status , Patient's Chart, lab work & pertinent test results  Airway Mallampati: II       Dental  (+) Partial Upper,    Pulmonary sleep apnea and Continuous Positive Airway Pressure Ventilation , COPD, former smoker,    Pulmonary exam normal breath sounds clear to auscultation       Cardiovascular hypertension, + CAD and + Peripheral Vascular Disease  Normal cardiovascular exam Rhythm:Regular Rate:Normal     Neuro/Psych  Headaches, PSYCHIATRIC DISORDERS Anxiety Depression  Neuromuscular disease    GI/Hepatic Neg liver ROS, PUD, GERD  ,  Endo/Other  negative endocrine ROSdiabetes  Renal/GU Renal disease  negative genitourinary   Musculoskeletal  (+) Arthritis ,   Abdominal   Peds negative pediatric ROS (+)  Hematology negative hematology ROS (+)   Anesthesia Other Findings   Reproductive/Obstetrics negative OB ROS                             Anesthesia Physical Anesthesia Plan  ASA: III  Anesthesia Plan: General   Post-op Pain Management:    Induction: Intravenous  PONV Risk Score and Plan: 2 and Propofol infusion  Airway Management Planned: Nasal Cannula  Additional Equipment: None  Intra-op Plan:   Post-operative Plan:   Informed Consent: I have reviewed the patients History and Physical, chart, labs and discussed the procedure including the risks, benefits and alternatives for the proposed anesthesia with the patient or authorized representative who has indicated his/her understanding and acceptance.       Plan Discussed with: CRNA, Anesthesiologist and Surgeon  Anesthesia Plan Comments:         Anesthesia Quick Evaluation

## 2020-04-04 NOTE — Anesthesia Postprocedure Evaluation (Signed)
Anesthesia Post Note  Patient: [name] [name]  Procedure(s) Performed: ESOPHAGOGASTRODUODENOSCOPY (EGD) WITH PROPOFOL (N/A )  Patient location during evaluation: Endoscopy Anesthesia Type: General Level of consciousness: awake and awake and alert Pain management: pain level controlled Vital Signs Assessment: post-procedure vital signs reviewed and stable Respiratory status: spontaneous breathing and nonlabored ventilation Cardiovascular status: blood pressure returned to baseline Postop Assessment: no headache and no apparent nausea or vomiting Anesthetic complications: no   No complications documented.   Last Vitals:  Vitals:   04/04/20 1011  BP: (!) 171/92  Pulse: 81  Resp: 18  Temp: (!) 36.3 C  SpO2: 99%    Last Pain:  Vitals:   04/04/20 1011  TempSrc: Tympanic  PainSc: 4                  Neva Seat

## 2020-04-04 NOTE — Op Note (Signed)
[facility] Patient Name: [name] [name] Procedure Date: 04/04/2020 10:30 AM MRN: [id number] Account #: [id number] Date of Birth: [birth year] Admit Type: Outpatient Age: 59 Room: [facility] ENDO ROOM 4 Gender: Male Note Status: Finalized Procedure:             Upper GI endoscopy Indications:           Heartburn Providers:             Lucilla Lame MD, MD Referring MD:          [name], MD (Referring MD) Medicines:             Propofol per Anesthesia Complications:         No immediate complications. Procedure:             Pre-Anesthesia Assessment:                        - Prior to the procedure, a History and Physical was                         performed, and patient medications and allergies were                         reviewed. The patient's tolerance of previous                         anesthesia was also reviewed. The risks and benefits                         of the procedure and the sedation options and risks                         were discussed with the patient. All questions were                         answered, and informed consent was obtained. Prior                         Anticoagulants: The patient has taken no previous                         anticoagulant or antiplatelet agents. ASA Grade                         Assessment: II - A patient with mild systemic disease.                         After reviewing the risks and benefits, the patient                         was deemed in satisfactory condition to undergo the                         procedure.                        After obtaining informed consent, the endoscope was  passed under direct vision. Throughout the procedure,                         the patient's blood pressure, pulse, and oxygen                         saturations were monitored continuously. The Endoscope                         was introduced through the mouth, and advanced to the                          second part of duodenum. The upper GI endoscopy was                         accomplished without difficulty. The patient tolerated                         the procedure well. Findings:      LA Grade A (one or more mucosal breaks less than 5 mm, not extending       between tops of 2 mucosal folds) esophagitis with no bleeding was found       at the gastroesophageal junction. Biopsies were taken with a cold       forceps for histology.      The stomach was normal.      The examined duodenum was normal.      A TTS dilator was passed through the scope. Dilation with a 15-16.5-18       mm balloon dilator was performed to 18 mm in the lower third of the       esophagus. Impression:            - LA Grade A reflux esophagitis with no bleeding.                         Biopsied.                        - Normal stomach.                        - Normal examined duodenum.                        - Dilation performed in the lower third of the                         esophagus. Recommendation:        - Discharge patient to home.                        - Resume previous diet.                        - Continue present medications.                        - Await pathology results. Procedure Code(s):     --- Professional ---  [phone], Esophagogastroduodenoscopy, flexible,                         transoral; with transendoscopic balloon dilation of                         esophagus (less than 30 mm diameter)                        43239, 59, Esophagogastroduodenoscopy, flexible,                         transoral; with biopsy, single or multiple Diagnosis Code(s):     --- Professional ---                        R12, Heartburn                        K21.00, Gastro-esophageal reflux disease with                         esophagitis, without bleeding CPT copyright 2019 American Medical Association. All rights reserved. The codes documented in this report are  preliminary and upon coder review may  be revised to meet current compliance requirements. Lucilla Lame MD, MD 04/04/2020 10:47:03 AM This report has been signed electronically. Number of Addenda: 0 Note Initiated On: 04/04/2020 10:30 AM Estimated Blood Loss:  Estimated blood loss: none.      [facility]

## 2020-04-04 NOTE — Transfer of Care (Signed)
Immediate Anesthesia Transfer of Care Note  Patient: [name] [name]  Procedure(s) Performed: ESOPHAGOGASTRODUODENOSCOPY (EGD) WITH PROPOFOL (N/A )  Patient Location: PACU and Endoscopy Unit  Anesthesia Type:General  Level of Consciousness: drowsy  Airway & Oxygen Therapy: Patient Spontanous Breathing  Post-op Assessment: Report given to RN  Post vital signs: stable  Last Vitals:  Vitals Value Taken Time  BP 159/79 04/04/20 1050  Temp    Pulse 69 04/04/20 1051  Resp 16 04/04/20 1051  SpO2 96 % 04/04/20 1051  Vitals shown include unvalidated device data.  Last Pain:  Vitals:   04/04/20 1011  TempSrc: Tympanic  PainSc: 4          Complications: No complications documented.

## 2020-04-04 NOTE — Progress Notes (Signed)
Patient has frequent vertigo and neuropathy

## 2020-04-04 NOTE — Interval H&P Note (Signed)
Lucilla Lame, MD [address] [place], [place] [address] Phone:[phone] Fax : [phone]  Primary Care Physician:  [name], MD Primary Gastroenterologist:  Dr. Allen Norris  Pre-Procedure History & Physical: HPI:  [name] [name] is a 59 y.o. male is here for an endoscopy.   Past Medical History:  Diagnosis Date   Arrhythmia    Arthritis    CAD (coronary artery disease)    a. 12/2016 s/p CABG x 4 (LIMA->LAD, VG->RCA, VG->OM1, VG->D1); b. 02/2018 MV: EF 35%, small-med inferolaterlal infarct. No ischemia.   Colon polyps    Diabetes ([place])    Dupre's syndrome    GERD (gastroesophageal reflux disease)    HFimpEF (heart failure with improved ejection fraction) ([place])    a. 2018 EF 35%; b. 02/2018 EF 50%; c. 02/2019 EF 40-45%; d. 08/2019 Echo: EF 50-55%, no rwma, Gr2 DD. Nl RV size/fxn. Triv MR. Mild Ao sclerosis.   Hyperlipidemia    Hypertension    Ischemic cardiomyopathy    a. 02/2018 MV: EF 35%; b. 08/2019 Echo: EF 50-55%.   Lymphedema    Migraine    Neuropathy    PAD (peripheral artery disease) ([place])    a. 04/2017 Aortoiliac duplex: R iliac dzs.   Peripheral vascular disease ([place])    Retinopathy    Sleep apnea    Stage 3 chronic kidney disease ([place])    Stomach ulcer     Past Surgical History:  Procedure Laterality Date   bone graft surgery     CARDIAC CATHETERIZATION  2013   S/p PCI    CARDIAC CATHETERIZATION  2018   S/p CABG   CARDIAC SURGERY     CORONARY ARTERY BYPASS GRAFT  2018   (LIMA-LAD,VG-RCA,VG-OM1,VG-D1)   EYE SURGERY     PERIPHERAL VASCULAR CATHETERIZATION Right 10/28/2016   PTA/DEB Right SFA   WRIST SURGERY      Prior to Admission medications   Medication Sig Start Date End Date Taking? Authorizing Provider  carvedilol (COREG) 12.5 MG tablet Take 12.5 mg by mouth 2 (two) times daily with a meal.   Yes [provider]  ezetimibe (ZETIA) 10 MG tablet TAKE 1 TABLET BY MOUTH EVERY DAY 01/03/20  Yes [name], MD  insulin  degludec (TRESIBA FLEXTOUCH) 100 UNIT/ML FlexTouch Pen Inject 140 Units into the skin daily. 04/03/20  Yes Boscia, Heather E, NP  insulin lispro (HUMALOG KWIKPEN) 100 UNIT/ML KwikPen Inject into the skin. 0-20 units per sliding scale   Yes [provider]  metolazone (ZAROXOLYN) 5 MG tablet Take 5 mg by mouth 2 (two) times a week. As needed for leg swelling   Yes [provider]  torsemide (DEMADEX) 20 MG tablet Take 2 tablets (40 mg) by mouth daily in the morning   Yes [provider]  albuterol (ACCUNEB) 0.63 MG/3ML nebulizer solution Inhale into the lungs.    [provider]  Alirocumab (PRALUENT) 75 MG/ML SOAJ Inject 75 mg into the skin every 14 (fourteen) days. 10/13/19   Minna Merritts, MD  CLINPRO 5000 1.1 % PSTE Place onto teeth at bedtime. 01/11/20   [provider]  cyclobenzaprine (FLEXERIL) 10 MG tablet Take one tab at night for neck spasm prn 03/07/20   [name], MD  diclofenac Sodium (VOLTAREN) 1 % GEL Apply 2 g topically in the morning and at bedtime. To the neck muscle 03/07/20   [name], MD  fluticasone furoate-vilanterol (BREO ELLIPTA) 100-25 MCG/INH AEPB Inhale 1 puff into  the lungs daily.    [provider]  metoCLOPramide (REGLAN) 5 MG tablet One tab before each meal and one at bed time for dyspepsia prn 01/19/20   [name], MD  pantoprazole (PROTONIX) 40 MG tablet TAKE 1 TABLET BY MOUTH EVERY DAY 03/31/20   [name], MD    Allergies as of 03/15/2020 - Review Complete 03/15/2020  Allergen Reaction Noted   Other Other (See Comments) 05/19/2019   Statins  05/19/2019   Atorvastatin Other (See Comments) 05/31/2019   Entresto [sacubitril-valsartan]  12/02/2019   Pravastatin Other (See Comments) 05/31/2019   Pregabalin Other (See Comments) 05/31/2019   Rosuvastatin Other (See Comments) 12/09/2012    Family History  Problem Relation Age of Onset   Diabetes Mother    Heart disease Father     Social  History   Socioeconomic History   Marital status: Married    Spouse name: Not on file   Number of children: Not on file   Years of education: Not on file   Highest education level: Not on file  Occupational History   Not on file  Tobacco Use   Smoking status: Former Smoker    Types: Cigarettes    Quit date: 05/18/2012    Years since quitting: 7.8   Smokeless tobacco: Never Used  Vaping Use   Vaping Use: Never used  Substance and Sexual Activity   Alcohol use: Yes    Comment: very rarely   Drug use: Never   Sexual activity: Not on file  Other Topics Concern   Not on file  Social History Narrative   Not on file   Social Determinants of Health   Financial Resource Strain:    Difficulty of Paying Living Expenses: Not on file  Food Insecurity:    Worried About Charity fundraiser in the Last Year: Not on file   [employer] of Food in the Last Year: Not on file  Transportation Needs:    Lack of Transportation (Medical): Not on file   Lack of Transportation (Non-Medical): Not on file  Physical Activity:    Days of Exercise per Week: Not on file   Minutes of Exercise per Session: Not on file  Stress:    Feeling of Stress : Not on file  Social Connections:    Frequency of Communication with Friends and Family: Not on file   Frequency of Social Gatherings with Friends and Family: Not on file   Attends Religious Services: Not on file   Active Member of Clubs or Organizations: Not on file   Attends Archivist Meetings: Not on file   Marital Status: Not on file  Intimate Partner Violence:    Fear of Current or Ex-Partner: Not on file   Emotionally Abused: Not on file   Physically Abused: Not on file   Sexually Abused: Not on file    Review of Systems: See HPI, otherwise negative ROS  Physical Exam: There were no vitals taken for this visit. General:   Alert,  pleasant and cooperative in NAD Head:  Normocephalic and atraumatic. Neck:  Supple; no masses or  thyromegaly. Lungs:  Clear throughout to auscultation.    Heart:  Regular rate and rhythm. Abdomen:  Soft, nontender and nondistended. Normal bowel sounds, without guarding, and without rebound.   Neurologic:  Alert and  oriented x4;  grossly normal neurologically.  Impression/Plan: [name] [name] is here for an endoscopy to be performed for dysphagia  Risks, benefits, limitations, and  alternatives regarding  endoscopy have been reviewed with the patient.  Questions have been answered.  All parties agreeable.   Lucilla Lame, MD  04/04/2020, 10:23 AM

## 2020-04-05 ENCOUNTER — Encounter: Payer: Self-pay | Admitting: Gastroenterology

## 2020-04-05 LAB — SURGICAL PATHOLOGY

## 2020-04-06 ENCOUNTER — Telehealth: Payer: Self-pay

## 2020-04-07 ENCOUNTER — Encounter (INDEPENDENT_AMBULATORY_CARE_PROVIDER_SITE_OTHER): Payer: Self-pay | Admitting: Vascular Surgery

## 2020-04-07 ENCOUNTER — Telehealth: Payer: Self-pay

## 2020-04-07 ENCOUNTER — Ambulatory Visit (INDEPENDENT_AMBULATORY_CARE_PROVIDER_SITE_OTHER): Payer: Managed Care, Other (non HMO) | Admitting: Vascular Surgery

## 2020-04-07 ENCOUNTER — Ambulatory Visit (INDEPENDENT_AMBULATORY_CARE_PROVIDER_SITE_OTHER): Payer: Managed Care, Other (non HMO)

## 2020-04-07 ENCOUNTER — Other Ambulatory Visit: Payer: Self-pay

## 2020-04-07 VITALS — BP 135/75 | HR 63 | Resp 16 | Wt 207.4 lb

## 2020-04-07 DIAGNOSIS — I890 Lymphedema, not elsewhere classified: Secondary | ICD-10-CM | POA: Diagnosis not present

## 2020-04-07 DIAGNOSIS — E1140 Type 2 diabetes mellitus with diabetic neuropathy, unspecified: Secondary | ICD-10-CM

## 2020-04-07 DIAGNOSIS — N1832 Chronic kidney disease, stage 3b: Secondary | ICD-10-CM

## 2020-04-07 DIAGNOSIS — M7989 Other specified soft tissue disorders: Secondary | ICD-10-CM

## 2020-04-07 DIAGNOSIS — Z794 Long term (current) use of insulin: Secondary | ICD-10-CM

## 2020-04-07 DIAGNOSIS — I739 Peripheral vascular disease, unspecified: Secondary | ICD-10-CM

## 2020-04-07 DIAGNOSIS — I10 Essential (primary) hypertension: Secondary | ICD-10-CM

## 2020-04-07 NOTE — Progress Notes (Signed)
MRN : [id number]  [name] [name] is a 59 y.o. ([birth year]) male who presents with chief complaint of  Chief Complaint  Patient presents with  . Follow-up    18month ultrasound follow up  .  History of Present Illness: Patient returns today in follow up of his leg swelling and peripheral arterial disease.  He has had some medication changes and his leg swelling has almost completely resolved.  He is trying to elevate his legs more.  His legs are currently doing okay.  He has a lot of small superficial scabs but he has no feeling on his legs and he hits them frequently.  Noninvasive studies were performed today showing diffuse atherosclerosis in the lower extremities without focal stenosis at this time.  Current Outpatient Medications  Medication Sig Dispense Refill  . albuterol (ACCUNEB) 0.63 MG/3ML nebulizer solution Inhale into the lungs.    . Alirocumab (PRALUENT) 75 MG/ML SOAJ Inject 75 mg into the skin every 14 (fourteen) days. 2 pen 11  . carvedilol (COREG) 12.5 MG tablet Take 12.5 mg by mouth 2 (two) times daily with a meal.    . CLINPRO 5000 1.1 % PSTE Place onto teeth at bedtime.    . cyclobenzaprine (FLEXERIL) 10 MG tablet Take one tab at night for neck spasm prn 15 tablet 0  . diclofenac Sodium (VOLTAREN) 1 % GEL Apply 2 g topically in the morning and at bedtime. To the neck muscle 150 g 1  . ezetimibe (ZETIA) 10 MG tablet TAKE 1 TABLET BY MOUTH EVERY DAY 90 tablet 1  . fluticasone furoate-vilanterol (BREO ELLIPTA) 100-25 MCG/INH AEPB Inhale 1 puff into the lungs daily.    . insulin degludec (TRESIBA FLEXTOUCH) 100 UNIT/ML FlexTouch Pen Inject 140 Units into the skin daily. 100 mL 2  . insulin lispro (HUMALOG KWIKPEN) 100 UNIT/ML KwikPen Inject into the skin. 0-20 units per sliding scale    . metoCLOPramide (REGLAN) 5 MG tablet One tab before each meal and one at bed time for dyspepsia prn 120 tablet 2  . metolazone (ZAROXOLYN) 5 MG tablet Take 5 mg by mouth 2 (two) times a  week. As needed for leg swelling    . pantoprazole (PROTONIX) 40 MG tablet TAKE 1 TABLET BY MOUTH EVERY DAY 30 tablet 0  . torsemide (DEMADEX) 20 MG tablet Take 2 tablets (40 mg) by mouth daily in the morning     No current facility-administered medications for this visit.    Past Medical History:  Diagnosis Date  . Arrhythmia   . Arthritis   . CAD (coronary artery disease)    a. 12/2016 s/p CABG x 4 (LIMA->LAD, VG->RCA, VG->OM1, VG->D1); b. 02/2018 MV: EF 35%, small-med inferolaterlal infarct. No ischemia.  . Colon polyps   . Diabetes ([place])   . Dupre's syndrome   . GERD (gastroesophageal reflux disease)   . HFimpEF (heart failure with improved ejection fraction) ([place])    a. 2018 EF 35%; b. 02/2018 EF 50%; c. 02/2019 EF 40-45%; d. 08/2019 Echo: EF 50-55%, no rwma, Gr2 DD. Nl RV size/fxn. Triv MR. Mild Ao sclerosis.  . Hyperlipidemia   . Hypertension   . Ischemic cardiomyopathy    a. 02/2018 MV: EF 35%; b. 08/2019 Echo: EF 50-55%.  . Lymphedema   . Migraine   . Neuropathy   . PAD (peripheral artery disease) ([place])    a. 04/2017 Aortoiliac duplex: R iliac dzs.  . Peripheral vascular disease ([place])   . Retinopathy   . Sleep apnea   .  Stage 3 chronic kidney disease ([place])   . Stomach ulcer     Past Surgical History:  Procedure Laterality Date  . bone graft surgery    . CARDIAC CATHETERIZATION  2013   S/p PCI   . CARDIAC CATHETERIZATION  2018   S/p CABG  . CARDIAC SURGERY    . CORONARY ARTERY BYPASS GRAFT  2018   (LIMA-LAD,VG-RCA,VG-OM1,VG-D1)  . ESOPHAGOGASTRODUODENOSCOPY (EGD) WITH PROPOFOL N/A 04/04/2020   Procedure: ESOPHAGOGASTRODUODENOSCOPY (EGD) WITH PROPOFOL;  Surgeon: Lucilla Lame, MD;  Location: [facility] ENDOSCOPY;  Service: Endoscopy;  Laterality: N/A;  . EYE SURGERY    . PERIPHERAL VASCULAR CATHETERIZATION Right 10/28/2016   PTA/DEB Right SFA  . WRIST SURGERY       Social History   Tobacco Use  . Smoking status: Former Smoker    Types: Cigarettes    Quit date:  05/18/2012    Years since quitting: 7.8  . Smokeless tobacco: Never Used  Vaping Use  . Vaping Use: Never used  Substance Use Topics  . Alcohol use: Yes    Comment: very rarely  . Drug use: Never      Family History  Problem Relation Age of Onset  . Diabetes Mother   . Heart disease Father   no bleeding or clotting disorders  Allergies  Allergen Reactions  . Other Other (See Comments)    Pain  With joint stiffness    . Statins     Pain  With joint stiffness   . Atorvastatin Other (See Comments)    Muscle aches Muscle aches  . Entresto [Sacubitril-Valsartan]     Hyperkalemia   . Pravastatin Other (See Comments)    Muscle aches Muscle aches  . Pregabalin Other (See Comments)    Generalized aches and pains Generalized aches and pains  . Rosuvastatin Other (See Comments)    Muscle Aches Other reaction(s): JOINT PAIN Other reaction(s): JOINT PAIN Muscle Aches    REVIEW OF SYSTEMS (Negative unless checked)  Constitutional: [] ?Weight loss  [] ?Fever  [] ?Chills Cardiac: [] ?Chest pain   [] ?Chest pressure   [x] ?Palpitations   [] ?Shortness of breath when laying flat   [] ?Shortness of breath at rest   [x] ?Shortness of breath with exertion. Vascular:  [] ?Pain in legs with walking   [] ?Pain in legs at rest   [] ?Pain in legs when laying flat   [] ?Claudication   [] ?Pain in feet when walking  [] ?Pain in feet at rest  [] ?Pain in feet when laying flat   [] ?History of DVT   [] ?Phlebitis   [x] ?Swelling in legs   [] ?Varicose veins   [] ?Non-healing ulcers Pulmonary:   [] ?Uses home oxygen   [] ?Productive cough   [] ?Hemoptysis   [] ?Wheeze  [] ?COPD   [] ?Asthma Neurologic:  [] ?Dizziness  [] ?Blackouts   [] ?Seizures   [] ?History of stroke   [] ?History of TIA  [] ?Aphasia   [] ?Temporary blindness   [] ?Dysphagia   [] ?Weakness or numbness in arms   [x] ?Weakness or numbness in legs Musculoskeletal:  [] ?Arthritis   [] ?Joint swelling   [] ?Joint pain   [] ?Low back pain Hematologic:  [] ?Easy bruising   [] ?Easy bleeding   [] ?Hypercoagulable state   [] ?Anemic  [] ?Hepatitis Gastrointestinal:  [] ?Blood in stool   [] ?Vomiting blood  [] ?Gastroesophageal reflux/heartburn   [] ?Abdominal pain Genitourinary:  [x] ?Chronic kidney disease   [] ?Difficult urination  [] ?Frequent urination  [] ?Burning with urination   [] ?Hematuria Skin:  [] ?Rashes   [] ?Ulcers   [] ?Wounds Psychological:  [] ?History of anxiety   [] ? History of major depression.   Physical Examination  BP 135/75 (BP Location: Right Arm)   Pulse 63   Resp 16   Wt 207 lb 6.4 oz (94.1 kg)   BMI 26.63 kg/m  Gen:  WD/WN, NAD Head: [place]/AT, No temporalis wasting. Ear/Nose/Throat: Hearing grossly intact, nares w/o erythema or drainage Eyes: Conjunctiva clear. Sclera non-icteric Neck: Supple.  Trachea midline Pulmonary:  Good air movement, no use of accessory muscles.  Cardiac: RRR, no JVD Vascular:  Vessel Right Left  Radial Palpable Palpable                          PT 1+ Palpable 1+ Palpable  DP 1+ Palpable 1+ Palpable    Musculoskeletal: M/S 5/5 throughout.  No deformity or atrophy. No edema. Neurologic: Sensation grossly intact in extremities.  Symmetrical.  Speech is fluent.  Psychiatric: Judgment intact, Mood & affect appropriate for pt's clinical situation. Dermatologic: No rashes or ulcers noted.  No cellulitis or open wounds.       Labs Recent Results (from the past 2160 hour(s))  POCT HgB A1C     Status: Abnormal   Collection Time: 02/18/20 10:51 AM  Result Value Ref Range   Hemoglobin A1C 14.0 (A) 4.0 - 5.6 %   HbA1c POC (<> result, manual entry)     HbA1c, POC (prediabetic range)     HbA1c, POC (controlled diabetic range)    Basic metabolic panel     Status: Abnormal   Collection Time: 03/13/20 12:42 PM  Result Value Ref Range   Sodium 127 (L) 135 - 145 mmol/L   Potassium 3.1 (L) 3.5 - 5.1 mmol/L   Chloride 83 (L) 98 - 111 mmol/L   CO2 28 22 - 32 mmol/L   Glucose, Bld 561 (HH) 70 - 99 mg/dL    Comment:  CRITICAL RESULT CALLED TO, READ BACK BY AND VERIFIED WITH  TIFFANY JOHNSON AT 1326 03/13/20 SDR Glucose reference range applies only to samples taken after fasting for at least 8 hours.    BUN 69 (H) 6 - 20 mg/dL   Creatinine, Ser 3.13 (H) 0.61 - 1.24 mg/dL   Calcium 9.0 8.9 - 10.3 mg/dL   GFR, Estimated 20 (L) >60 mL/min   Anion gap 16 (H) 5 - 15    Comment: Performed at [facility], [address] [place], [place] [address]  CBC     Status: None   Collection Time: 03/13/20 12:42 PM  Result Value Ref Range   WBC 10.0 4.0 - 10.5 K/uL   RBC 4.84 4.22 - 5.81 MIL/uL   Hemoglobin 14.9 13.0 - 17.0 g/dL   HCT 41.4 39 - 52 %   MCV 85.5 80.0 - 100.0 fL   MCH 30.8 26.0 - 34.0 pg   MCHC 36.0 30.0 - 36.0 g/dL   RDW 12.8 11.5 - 15.5 %   Platelets 334 150 - 400 K/uL   nRBC 0.0 0.0 - 0.2 %    Comment: Performed at [facility], [address] [place], [place] [address]  Blood gas, venous     Status: Abnormal   Collection Time: 03/13/20  2:15 PM  Result Value Ref Range   pH, Ven 7.48 (H) 7.25 - 7.43   pCO2, Ven 47 44 - 60 mmHg   pO2, Ven 41.0 32 - 45 mmHg   Bicarbonate 35.0 (H) 20.0 - 28.0 mmol/L   Acid-Base Excess 9.9 (H) 0.0 - 2.0 mmol/L   O2 Saturation 80.4 %   Patient temperature  37.0    Collection site VEIN    Sample type VEIN     Comment: Performed at [facility], [address] [place], [place] [address]  Basic metabolic panel     Status: Abnormal   Collection Time: 03/13/20  3:30 PM  Result Value Ref Range   Sodium 132 (L) 135 - 145 mmol/L   Potassium 2.6 (LL) 3.5 - 5.1 mmol/L    Comment: CRITICAL RESULT CALLED TO, READ BACK BY AND VERIFIED WITH KELLY WILLIAMS @1713  ON 03/13/20 SKL    Chloride 90 (L) 98 - 111 mmol/L   CO2 30 22 - 32 mmol/L   Glucose, Bld 374 (H) 70 - 99 mg/dL    Comment: Glucose reference range applies only to samples taken after fasting for at least 8 hours.   BUN 64 (H) 6 - 20 mg/dL   Creatinine, Ser 2.75 (H) 0.61 - 1.24  mg/dL   Calcium 8.3 (L) 8.9 - 10.3 mg/dL   GFR, Estimated 24 (L) >60 mL/min   Anion gap 12 5 - 15    Comment: Performed at [facility], [address] [place], [place] [address]  Glucose, capillary     Status: Abnormal   Collection Time: 03/13/20  4:40 PM  Result Value Ref Range   Glucose-Capillary 331 (H) 70 - 99 mg/dL    Comment: Glucose reference range applies only to samples taken after fasting for at least 8 hours.  SARS CORONAVIRUS 2 (TAT 6-24 HRS) Nasopharyngeal Nasopharyngeal Swab     Status: None   Collection Time: 03/31/20 12:42 PM   Specimen: Nasopharyngeal Swab  Result Value Ref Range   SARS Coronavirus 2 NEGATIVE NEGATIVE    Comment: (NOTE) SARS-CoV-2 target nucleic acids are NOT DETECTED.  The SARS-CoV-2 RNA is generally detectable in upper and lower respiratory specimens during the acute phase of infection. Negative results do not preclude SARS-CoV-2 infection, do not rule out co-infections with other pathogens, and should not be used as the sole basis for treatment or other patient management decisions. Negative results must be combined with clinical observations, patient history, and epidemiological information. The expected result is Negative.  Fact Sheet for Patients: SugarRoll.be  Fact Sheet for Healthcare Providers: https://www.woods-mathews.com/  This test is not yet approved or cleared by the [place] FDA and  has been authorized for detection and/or diagnosis of SARS-CoV-2 by FDA under an Emergency Use Authorization (EUA). This EUA will remain  in effect (meaning this test can be used) for the duration of the COVID-19 declaration under Se ction 564(b)(1) of the Act, 21 U.S.C. section 360bbb-3(b)(1), unless the authorization is terminated or revoked sooner.  Performed at [facility], [place] [address], [place], [place] [address]   Glucose, capillary     Status: Abnormal   Collection Time:  04/04/20 10:22 AM  Result Value Ref Range   Glucose-Capillary 416 (H) 70 - 99 mg/dL    Comment: Glucose reference range applies only to samples taken after fasting for at least 8 hours.  Surgical pathology     Status: None   Collection Time: 04/04/20 10:44 AM  Result Value Ref Range   SURGICAL PATHOLOGY      SURGICAL PATHOLOGY CASE: [id number] PATIENT: [name] Surgical Pathology Report     Specimen Submitted: A. GEJ; cbx  Clinical History: GERD K21.9      DIAGNOSIS: A. GEJ; COLD BIOPSY: - BARRETT'S ESOPHAGUS WITH LOW-GRADE DYSPLASIA. - NEGATIVE FOR MALIGNANCY.   GROSS DESCRIPTION: A. Labeled: cbx  GEJ Received: Formalin Tissue fragment(s): 2 Size: Range from 0.3-0.4 cm Description: Tan soft tissue fragments Entirely submitted in 1 cassette.     Final Diagnosis performed by Betsy Pries, MD.   Electronically signed 04/05/2020 11:41:26AM The electronic signature indicates that the named Attending Pathologist has evaluated the specimen Technical component performed at [facility], [address] [place], [place] [address] Lab: [phone] Dir: Rush Farmer, MD, MMM  Professional component performed at [facility], [facility], [address], [place], [place] [address] Lab: [phone] Dir: Dellia Nims. Rubinas, MD   Glucose, capillary     Status: Abnormal   Collection Time: 04/04/20 11:18 AM  Result Value Ref Range   Glucose-Capillary 392 (H) 70 - 99 mg/dL    Comment: Glucose reference range applies only to samples taken after fasting for at least 8 hours.    Radiology DG Cervical Spine Complete  Result Date: 03/27/2020 CLINICAL DATA:  Cervical spine crepitus EXAM: CERVICAL SPINE - COMPLETE 4+ VIEW COMPARISON:  None FINDINGS: There is straightening of the cervical spine. No acute fracture. Minimal retrolisthesis of C4 upon C5 and C5 upon C6 is likely degenerative in nature. Vertebral body height has been preserved. There is severe  intervertebral disc space narrowing and endplate remodeling at V3-7 and C5-6 in keeping with changes of severe degenerative disc disease. The spinal canal is widely patent. The prevertebral soft tissues are unremarkable. Oblique views fail to profile the right neural foramina. The left neural foramen are not optimally profiled, however, there is at least mild left neural foraminal narrowing at C4-5 and C5-6 secondary to uncovertebral arthrosis. Lateral masses of C1 are well aligned with the body of C2. IMPRESSION: Advanced degenerative disc disease C4-5 and C5-6 with at least mild left neural foraminal narrowing at this level. Electronically Signed   By: Fidela Salisbury MD   On: 03/27/2020 23:56    Assessment/Plan Diabetes ([place]) blood glucose control important in reducing the progression of atherosclerotic disease. Also, involved in wound healing. On appropriate medications.   Essential hypertension blood pressure control important in reducing the progression of atherosclerotic disease. On appropriate oral medications.   CKD (chronic kidney disease) stage 3, GFR 30-59 ml/min Chronic kidney disease can certainly be contributing to lower extremity swelling.  He is on diuretic therapy.  Swelling of limb Markedly improved.  PAD (peripheral artery disease) (HCC) Diffuse atherosclerosis in the lower extremities without focal severe stenosis. No role for intervention. Recheck in one year.    Leotis Pain, MD  04/07/2020 12:06 PM    This note was created with Dragon medical transcription system.  Any errors from dictation are purely unintentional

## 2020-04-07 NOTE — Telephone Encounter (Signed)
Pt called [name] [name] yesterday and was extremely rude with her because she expressed to him that she needed to speak with [place] about getting him in to see [place]. Pt called this morning and was rude and yelling at me about how he couldn't get his machine to work right and if we couldn't tell him how to fix it today he was sending it back because he has had the machine for a week and cant get the mask to work. I put him on to [facility] Monday morning. He agreed and hung up on me.

## 2020-04-07 NOTE — Assessment & Plan Note (Signed)
Diffuse atherosclerosis in the lower extremities without focal severe stenosis. No role for intervention. Recheck in one year.

## 2020-04-07 NOTE — Assessment & Plan Note (Signed)
Markedly improved 

## 2020-04-09 DIAGNOSIS — M542 Cervicalgia: Secondary | ICD-10-CM | POA: Insufficient documentation

## 2020-04-09 DIAGNOSIS — R55 Syncope and collapse: Secondary | ICD-10-CM | POA: Insufficient documentation

## 2020-04-10 ENCOUNTER — Encounter: Payer: Self-pay | Admitting: Internal Medicine

## 2020-04-10 ENCOUNTER — Other Ambulatory Visit: Payer: Self-pay

## 2020-04-10 ENCOUNTER — Ambulatory Visit: Payer: Managed Care, Other (non HMO) | Admitting: Internal Medicine

## 2020-04-10 VITALS — BP 138/84 | HR 90 | Temp 97.8°F | Resp 16 | Ht 74.0 in | Wt 210.0 lb

## 2020-04-10 DIAGNOSIS — G4733 Obstructive sleep apnea (adult) (pediatric): Secondary | ICD-10-CM

## 2020-04-10 DIAGNOSIS — N1832 Chronic kidney disease, stage 3b: Secondary | ICD-10-CM

## 2020-04-10 DIAGNOSIS — K219 Gastro-esophageal reflux disease without esophagitis: Secondary | ICD-10-CM

## 2020-04-10 DIAGNOSIS — I509 Heart failure, unspecified: Secondary | ICD-10-CM | POA: Diagnosis not present

## 2020-04-10 NOTE — Patient Instructions (Signed)

## 2020-04-10 NOTE — Telephone Encounter (Signed)
Patient has appointment today

## 2020-04-10 NOTE — Progress Notes (Signed)
[facility] [place], [place] [address]  Pulmonary Sleep Medicine   Office Visit Note  Patient Name: [name] [name] DOB: December 05, 1960 MRN [id number]  Date of Service: 04/10/2020  Complaints/HPI: OSA feels as though there is not enough air. Patient also had some issues with cardiac problems. He cannot sleep flat. He usually will sleep in a recliner. He needs this assessed and he may need to have the pressures adjusted.  EKG has been having a lot of chronic trouble with the CPAP device.  I reviewed his home sleep study he has severe obstructive sleep apnea and his recommended pressure was 9.  The patient however feels that it is not fair.  I would like for him to possibly have a retitration done if we cannot get his pressure adjusted empirically.  If he is not able to tolerate the APAP pressures then I would consider treating with BiPAP.  However prior to doing any of this I have ordered an echocardiogram to be done because he does have a history of congestive heart failure and I would like to know what his EF is prior to making any further adjustments  ROS  General: (-) fever, (-) chills, (-) night sweats, (-) weakness Skin: (-) rashes, (-) itching,. Eyes: (-) visual changes, (-) redness, (-) itching. Nose and Sinuses: (-) nasal stuffiness or itchiness, (-) postnasal drip, (-) nosebleeds, (-) sinus trouble. Mouth and Throat: (-) sore throat, (-) hoarseness. Neck: (-) swollen glands, (-) enlarged thyroid, (-) neck pain. Respiratory: - cough, (-) bloody sputum, + shortness of breath, - wheezing. Cardiovascular: - ankle swelling, (-) chest pain. Lymphatic: (-) lymph node enlargement. Neurologic: (-) numbness, (-) tingling. Psychiatric: (-) anxiety, (-) depression   Current Medication: Outpatient Encounter Medications as of 04/10/2020  Medication Sig  . albuterol (ACCUNEB) 0.63 MG/3ML nebulizer solution Inhale into the lungs.  . Alirocumab (PRALUENT) 75 MG/ML  SOAJ Inject 75 mg into the skin every 14 (fourteen) days.  . carvedilol (COREG) 12.5 MG tablet Take 12.5 mg by mouth 2 (two) times daily with a meal.  . CLINPRO 5000 1.1 % PSTE Place onto teeth at bedtime.  . cyclobenzaprine (FLEXERIL) 10 MG tablet Take one tab at night for neck spasm prn  . diclofenac Sodium (VOLTAREN) 1 % GEL Apply 2 g topically in the morning and at bedtime. To the neck muscle  . ezetimibe (ZETIA) 10 MG tablet TAKE 1 TABLET BY MOUTH EVERY DAY  . fluticasone furoate-vilanterol (BREO ELLIPTA) 100-25 MCG/INH AEPB Inhale 1 puff into the lungs daily.  . insulin degludec (TRESIBA FLEXTOUCH) 100 UNIT/ML FlexTouch Pen Inject 140 Units into the skin daily.  . insulin lispro (HUMALOG KWIKPEN) 100 UNIT/ML KwikPen Inject into the skin. 0-20 units per sliding scale  . metoCLOPramide (REGLAN) 5 MG tablet One tab before each meal and one at bed time for dyspepsia prn  . metolazone (ZAROXOLYN) 5 MG tablet Take 5 mg by mouth 2 (two) times a week. As needed for leg swelling  . pantoprazole (PROTONIX) 40 MG tablet TAKE 1 TABLET BY MOUTH EVERY DAY  . torsemide (DEMADEX) 20 MG tablet Take 2 tablets (40 mg) by mouth daily in the morning   No facility-administered encounter medications on file as of 04/10/2020.    Surgical History: Past Surgical History:  Procedure Laterality Date  . bone graft surgery    . CARDIAC CATHETERIZATION  2013   S/p PCI   . CARDIAC CATHETERIZATION  2018   S/p CABG  . CARDIAC SURGERY    .  CORONARY ARTERY BYPASS GRAFT  2018   (LIMA-LAD,VG-RCA,VG-OM1,VG-D1)  . ESOPHAGOGASTRODUODENOSCOPY (EGD) WITH PROPOFOL N/A 04/04/2020   Procedure: ESOPHAGOGASTRODUODENOSCOPY (EGD) WITH PROPOFOL;  Surgeon: Lucilla Lame, MD;  Location: [facility] ENDOSCOPY;  Service: Endoscopy;  Laterality: N/A;  . EYE SURGERY    . PERIPHERAL VASCULAR CATHETERIZATION Right 10/28/2016   PTA/DEB Right SFA  . WRIST SURGERY      Medical History: Past Medical History:  Diagnosis Date  . Arrhythmia   .  Arthritis   . CAD (coronary artery disease)    a. 12/2016 s/p CABG x 4 (LIMA->LAD, VG->RCA, VG->OM1, VG->D1); b. 02/2018 MV: EF 35%, small-med inferolaterlal infarct. No ischemia.  . Colon polyps   . Diabetes ([place])   . Dupre's syndrome   . GERD (gastroesophageal reflux disease)   . HFimpEF (heart failure with improved ejection fraction) ([place])    a. 2018 EF 35%; b. 02/2018 EF 50%; c. 02/2019 EF 40-45%; d. 08/2019 Echo: EF 50-55%, no rwma, Gr2 DD. Nl RV size/fxn. Triv MR. Mild Ao sclerosis.  . Hyperlipidemia   . Hypertension   . Ischemic cardiomyopathy    a. 02/2018 MV: EF 35%; b. 08/2019 Echo: EF 50-55%.  . Lymphedema   . Migraine   . Neuropathy   . PAD (peripheral artery disease) ([place])    a. 04/2017 Aortoiliac duplex: R iliac dzs.  . Peripheral vascular disease ([place])   . Retinopathy   . Sleep apnea   . Stage 3 chronic kidney disease ([place])   . Stomach ulcer     Family History: Family History  Problem Relation Age of Onset  . Diabetes Mother   . Heart disease Father     Social History: Social History   Socioeconomic History  . Marital status: Married    Spouse name: Not on file  . Number of children: Not on file  . Years of education: Not on file  . Highest education level: Not on file  Occupational History  . Not on file  Tobacco Use  . Smoking status: Former Smoker    Types: Cigarettes    Quit date: 05/18/2012    Years since quitting: 7.9  . Smokeless tobacco: Never Used  Vaping Use  . Vaping Use: Never used  Substance and Sexual Activity  . Alcohol use: Yes    Comment: very rarely  . Drug use: Never  . Sexual activity: Not on file  Other Topics Concern  . Not on file  Social History Narrative  . Not on file   Social Determinants of Health   Financial Resource Strain:   . Difficulty of Paying Living Expenses: Not on file  Food Insecurity:   . Worried About Charity fundraiser in the Last Year: Not on file  . Ran Out of Food in the Last Year: Not on file   Transportation Needs:   . Lack of Transportation (Medical): Not on file  . Lack of Transportation (Non-Medical): Not on file  Physical Activity:   . Days of Exercise per Week: Not on file  . Minutes of Exercise per Session: Not on file  Stress:   . Feeling of Stress : Not on file  Social Connections:   . Frequency of Communication with Friends and Family: Not on file  . Frequency of Social Gatherings with Friends and Family: Not on file  . Attends Religious Services: Not on file  . Active Member of Clubs or Organizations: Not on file  . Attends Archivist Meetings: Not on file  .  Marital Status: Not on file  Intimate Partner Violence:   . Fear of Current or Ex-Partner: Not on file  . Emotionally Abused: Not on file  . Physically Abused: Not on file  . Sexually Abused: Not on file    Vital Signs: Blood pressure 138/84, pulse 90, temperature 97.8 F (36.6 C), resp. rate 16, height 6\' 2"  (1.88 m), weight 210 lb (95.3 kg), SpO2 98 %.  Examination: General Appearance: The patient is well-developed, well-nourished, and in no distress. Skin: Gross inspection of skin unremarkable. Head: normocephalic, no gross deformities. Eyes: no gross deformities noted. ENT: ears appear grossly normal no exudates. Neck: Supple. No thyromegaly. No LAD. Respiratory: no rhonchi noted. Cardiovascular: Normal S1 and S2 without murmur or rub. Extremities: No cyanosis. pulses are equal. Neurologic: Alert and oriented. No involuntary movements.  LABS: Recent Results (from the past 2160 hour(s))  POCT HgB A1C     Status: Abnormal   Collection Time: 02/18/20 10:51 AM  Result Value Ref Range   Hemoglobin A1C 14.0 (A) 4.0 - 5.6 %   HbA1c POC (<> result, manual entry)     HbA1c, POC (prediabetic range)     HbA1c, POC (controlled diabetic range)    Basic metabolic panel     Status: Abnormal   Collection Time: 03/13/20 12:42 PM  Result Value Ref Range   Sodium 127 (L) 135 - 145 mmol/L    Potassium 3.1 (L) 3.5 - 5.1 mmol/L   Chloride 83 (L) 98 - 111 mmol/L   CO2 28 22 - 32 mmol/L   Glucose, Bld 561 (HH) 70 - 99 mg/dL    Comment: CRITICAL RESULT CALLED TO, READ BACK BY AND VERIFIED WITH  TIFFANY JOHNSON AT 1326 03/13/20 SDR Glucose reference range applies only to samples taken after fasting for at least 8 hours.    BUN 69 (H) 6 - 20 mg/dL   Creatinine, Ser 3.13 (H) 0.61 - 1.24 mg/dL   Calcium 9.0 8.9 - 10.3 mg/dL   GFR, Estimated 20 (L) >60 mL/min   Anion gap 16 (H) 5 - 15    Comment: Performed at [facility], [address] [place], [place] [address]  CBC     Status: None   Collection Time: 03/13/20 12:42 PM  Result Value Ref Range   WBC 10.0 4.0 - 10.5 K/uL   RBC 4.84 4.22 - 5.81 MIL/uL   Hemoglobin 14.9 13.0 - 17.0 g/dL   HCT 41.4 39 - 52 %   MCV 85.5 80.0 - 100.0 fL   MCH 30.8 26.0 - 34.0 pg   MCHC 36.0 30.0 - 36.0 g/dL   RDW 12.8 11.5 - 15.5 %   Platelets 334 150 - 400 K/uL   nRBC 0.0 0.0 - 0.2 %    Comment: Performed at [facility], [address] [place], [place] [address]  Blood gas, venous     Status: Abnormal   Collection Time: 03/13/20  2:15 PM  Result Value Ref Range   pH, Ven 7.48 (H) 7.25 - 7.43   pCO2, Ven 47 44 - 60 mmHg   pO2, Ven 41.0 32 - 45 mmHg   Bicarbonate 35.0 (H) 20.0 - 28.0 mmol/L   Acid-Base Excess 9.9 (H) 0.0 - 2.0 mmol/L   O2 Saturation 80.4 %   Patient temperature 37.0    Collection site VEIN    Sample type VEIN     Comment: Performed at [facility], [address], [place], [place] [address]  Basic  metabolic panel     Status: Abnormal   Collection Time: 03/13/20  3:30 PM  Result Value Ref Range   Sodium 132 (L) 135 - 145 mmol/L   Potassium 2.6 (LL) 3.5 - 5.1 mmol/L    Comment: CRITICAL RESULT CALLED TO, READ BACK BY AND VERIFIED WITH KELLY WILLIAMS @1713  ON 03/13/20 SKL    Chloride 90 (L) 98 - 111 mmol/L   CO2 30 22 - 32 mmol/L   Glucose, Bld 374 (H) 70 - 99 mg/dL    Comment: Glucose  reference range applies only to samples taken after fasting for at least 8 hours.   BUN 64 (H) 6 - 20 mg/dL   Creatinine, Ser 2.75 (H) 0.61 - 1.24 mg/dL   Calcium 8.3 (L) 8.9 - 10.3 mg/dL   GFR, Estimated 24 (L) >60 mL/min   Anion gap 12 5 - 15    Comment: Performed at [facility], [address] [place], [place] [address]  Glucose, capillary     Status: Abnormal   Collection Time: 03/13/20  4:40 PM  Result Value Ref Range   Glucose-Capillary 331 (H) 70 - 99 mg/dL    Comment: Glucose reference range applies only to samples taken after fasting for at least 8 hours.  SARS CORONAVIRUS 2 (TAT 6-24 HRS) Nasopharyngeal Nasopharyngeal Swab     Status: None   Collection Time: 03/31/20 12:42 PM   Specimen: Nasopharyngeal Swab  Result Value Ref Range   SARS Coronavirus 2 NEGATIVE NEGATIVE    Comment: (NOTE) SARS-CoV-2 target nucleic acids are NOT DETECTED.  The SARS-CoV-2 RNA is generally detectable in upper and lower respiratory specimens during the acute phase of infection. Negative results do not preclude SARS-CoV-2 infection, do not rule out co-infections with other pathogens, and should not be used as the sole basis for treatment or other patient management decisions. Negative results must be combined with clinical observations, patient history, and epidemiological information. The expected result is Negative.  Fact Sheet for Patients: SugarRoll.be  Fact Sheet for Healthcare Providers: https://www.woods-mathews.com/  This test is not yet approved or cleared by the [place] FDA and  has been authorized for detection and/or diagnosis of SARS-CoV-2 by FDA under an Emergency Use Authorization (EUA). This EUA will remain  in effect (meaning this test can be used) for the duration of the COVID-19 declaration under Se ction 564(b)(1) of the Act, 21 U.S.C. section 360bbb-3(b)(1), unless the authorization is terminated or revoked  sooner.  Performed at [facility], [place] [address], [place], [place] [address]   Glucose, capillary     Status: Abnormal   Collection Time: 04/04/20 10:22 AM  Result Value Ref Range   Glucose-Capillary 416 (H) 70 - 99 mg/dL    Comment: Glucose reference range applies only to samples taken after fasting for at least 8 hours.  Surgical pathology     Status: None   Collection Time: 04/04/20 10:44 AM  Result Value Ref Range   SURGICAL PATHOLOGY      SURGICAL PATHOLOGY CASE: [id number] PATIENT: [name] Surgical Pathology Report     Specimen Submitted: A. GEJ; cbx  Clinical History: GERD K21.9      DIAGNOSIS: A. GEJ; COLD BIOPSY: - BARRETT'S ESOPHAGUS WITH LOW-GRADE DYSPLASIA. - NEGATIVE FOR MALIGNANCY.   GROSS DESCRIPTION: A. Labeled: cbx GEJ Received: Formalin Tissue fragment(s): 2 Size: Range from 0.3-0.4 cm Description: Tan soft tissue fragments Entirely submitted in 1 cassette.     Final Diagnosis performed by Betsy Pries, MD.  Electronically signed 04/05/2020 11:41:26AM The electronic signature indicates that the named Attending Pathologist has evaluated the specimen Technical component performed at [facility], [address], [place], [place] [address] Lab: [phone] Dir: Rush Farmer, MD, MMM  Professional component performed at [facility], [facility], [place], [place], [place] [address] Lab: [phone] Dir: Dellia Nims. Rubinas, MD   Glucose, capillary     Status: Abnormal   Collection Time: 04/04/20 11:18 AM  Result Value Ref Range   Glucose-Capillary 392 (H) 70 - 99 mg/dL    Comment: Glucose reference range applies only to samples taken after fasting for at least 8 hours.    Radiology: No results found.  No results found.  DG Cervical Spine Complete  Result Date: 03/27/2020 CLINICAL DATA:  Cervical spine crepitus EXAM: CERVICAL SPINE - COMPLETE 4+ VIEW COMPARISON:  None FINDINGS: There is  straightening of the cervical spine. No acute fracture. Minimal retrolisthesis of C4 upon C5 and C5 upon C6 is likely degenerative in nature. Vertebral body height has been preserved. There is severe intervertebral disc space narrowing and endplate remodeling at U3-1 and C5-6 in keeping with changes of severe degenerative disc disease. The spinal canal is widely patent. The prevertebral soft tissues are unremarkable. Oblique views fail to profile the right neural foramina. The left neural foramen are not optimally profiled, however, there is at least mild left neural foraminal narrowing at C4-5 and C5-6 secondary to uncovertebral arthrosis. Lateral masses of C1 are well aligned with the body of C2. IMPRESSION: Advanced degenerative disc disease C4-5 and C5-6 with at least mild left neural foraminal narrowing at this level. Electronically Signed   By: Fidela Salisbury MD   On: 03/27/2020 23:56      Assessment and Plan: Patient Active Problem List   Diagnosis Date Noted  . Cervicalgia 04/09/2020  . Syncope and collapse 04/09/2020  . Esophageal dysphagia   . Gastroesophageal reflux disease with esophagitis without hemorrhage   . PAD (peripheral artery disease) ([place]) 09/27/2019  . Diabetes ([place]) 09/14/2019  . CKD (chronic kidney disease) stage 3, GFR 30-59 ml/min (H[facility]) 09/14/2019  . Swelling of limb 09/14/2019  . Lymphedema 09/14/2019  . Chronic obstructive pulmonary disease ([place]) 09/05/2019  . Shortness of breath 09/05/2019  . Essential hypertension 09/05/2019  . Coronary atherosclerosis 11/15/2016  . Heel ulcer due to DM ([place]) 12/09/2012  . Septic olecranon bursitis 10/14/2011  . Cellulitis 10/06/2011  . Depression with anxiety 02/22/2011  . Diabetic peripheral neuropathy associated with type 1 diabetes mellitus ([place]) 02/22/2011  . Migraines 02/22/2011  . Hyperlipidemia, mixed 02/22/2011  . Sleep apnea 11/22/2010  . Coronary artery disease with history of myocardial infarction without  history of CABG 10/21/2009    1. OSA he has been having a lot of difficulty tolerating the CPAP at a pressure of 9.  He may need to go on to BiPAP or AutoPap however prior to doing that I recommended that we get an echocardiogram because of his history of heart failure.  Once this is done then will make a decision regarding which direction to go 2. CHF patient has history of heart failure with an echo in the past showing preserved ejection fraction I will order a follow-up echocardiogram to reassess the ejection fraction. 3. CKD stage 3Patient sees Dr Candiss Norse for his renal issues 4. GERD controlled will monitor  General Counseling: I have discussed the findings of the evaluation and examination with Nehemiah Massed.  I have also discussed any further diagnostic evaluation thatmay be  needed or ordered today. Elpidio verbalizes understanding of the findings of todays visit. We also reviewed his medications today and discussed drug interactions and side effects including but not limited excessive drowsiness and altered mental states. We also discussed that there is always a risk not just to him but also people around him. he has been encouraged to call the office with any questions or concerns that should arise related to todays visit.  Orders Placed This Encounter  Procedures  . ECHOCARDIOGRAM COMPLETE    Standing Status:   Future    Standing Expiration Date:   04/10/2021    Order Specific Question:   Where should this test be performed    Answer:   External    Order Specific Question:   Perflutren DEFINITY (image enhancing agent) should be administered unless hypersensitivity or allergy exist    Answer:   Administer Perflutren    Order Specific Question:   Reason for exam-Echo    Answer:   Dyspnea  786.09 / R06.00     Time spent: 54min  I have personally obtained a history, examined the patient, evaluated laboratory and imaging results, formulated the assessment and plan and placed orders.     Allyne Gee, MD [facility] Pulmonary and Critical Care Sleep medicine

## 2020-04-11 ENCOUNTER — Telehealth: Payer: Self-pay

## 2020-04-11 NOTE — Telephone Encounter (Signed)
Pt called today and advised he will be bringing CPAP back to [facility] tmrw, and is refusing to use it.

## 2020-04-13 ENCOUNTER — Ambulatory Visit: Payer: Managed Care, Other (non HMO) | Admitting: Gastroenterology

## 2020-04-17 ENCOUNTER — Other Ambulatory Visit: Payer: Self-pay

## 2020-04-17 ENCOUNTER — Telehealth: Payer: Self-pay

## 2020-04-17 ENCOUNTER — Ambulatory Visit: Payer: Managed Care, Other (non HMO) | Admitting: Nurse Practitioner

## 2020-04-17 ENCOUNTER — Encounter: Payer: Self-pay | Admitting: Nurse Practitioner

## 2020-04-17 VITALS — BP 148/86 | HR 81 | Temp 97.8°F | Resp 16 | Ht 74.0 in | Wt 203.0 lb

## 2020-04-17 DIAGNOSIS — J449 Chronic obstructive pulmonary disease, unspecified: Secondary | ICD-10-CM | POA: Diagnosis not present

## 2020-04-17 DIAGNOSIS — M5000 Cervical disc disorder with myelopathy, unspecified cervical region: Secondary | ICD-10-CM | POA: Diagnosis not present

## 2020-04-17 DIAGNOSIS — Z794 Long term (current) use of insulin: Secondary | ICD-10-CM

## 2020-04-17 DIAGNOSIS — E1140 Type 2 diabetes mellitus with diabetic neuropathy, unspecified: Secondary | ICD-10-CM | POA: Diagnosis not present

## 2020-04-17 DIAGNOSIS — M542 Cervicalgia: Secondary | ICD-10-CM | POA: Diagnosis not present

## 2020-04-17 MED ORDER — BD PEN NEEDLE SHORT U/F 31G X 8 MM MISC: 1 refills | Status: DC

## 2020-04-17 NOTE — Telephone Encounter (Signed)
-----   Message from Lucilla Lame, MD sent at 04/06/2020  1:09 PM EST ----- Please have the patient come in for a follow up.

## 2020-04-17 NOTE — Progress Notes (Signed)
[facility] [place], [place] [address]  Internal MEDICINE  Office Visit Note  Patient Name: [name] [name]  [id number]  [id number]  Date of Service: 04/26/2020  Chief Complaint  Patient presents with  . Follow-up    pain management  . Diabetes  . Gastroesophageal Reflux  . Hyperlipidemia  . Hypertension  . Quality Metric Gaps    hep C  . controlled substance form    recieved    The patient is here for follow up visit. He has been having balance issus which resulted in ER visit. A CT scan was ordered for further evaluation. This was denies. He did have an x-ray of cervical spine. X-ray of cervical spine showing Advanced degenerative disc disease C4-5 and C5-6 with at least mild left neural foraminal narrowing at this level. Having radiculopathy in hands and feet. He does have burning and squeezing sensation in the fingers, hands, wrists and arms. He has similar type pain In the legs and feet.       Current Medication: Outpatient Encounter Medications as of 04/17/2020  Medication Sig  . albuterol (ACCUNEB) 0.63 MG/3ML nebulizer solution Inhale into the lungs.  . Alirocumab (PRALUENT) 75 MG/ML SOAJ Inject 75 mg into the skin every 14 (fourteen) days.  . carvedilol (COREG) 12.5 MG tablet Take 12.5 mg by mouth 2 (two) times daily with a meal.  . CLINPRO 5000 1.1 % PSTE Place onto teeth at bedtime.  . cyclobenzaprine (FLEXERIL) 10 MG tablet Take one tab at night for neck spasm prn  . diclofenac Sodium (VOLTAREN) 1 % GEL Apply 2 g topically in the morning and at bedtime. To the neck muscle  . ezetimibe (ZETIA) 10 MG tablet TAKE 1 TABLET BY MOUTH EVERY DAY  . fluticasone furoate-vilanterol (BREO ELLIPTA) 100-25 MCG/INH AEPB Inhale 1 puff into the lungs daily.  . insulin degludec (TRESIBA FLEXTOUCH) 100 UNIT/ML FlexTouch Pen Inject 140 Units into the skin daily.  . insulin lispro (HUMALOG KWIKPEN) 100 UNIT/ML KwikPen Inject into the skin. 0-20 units per  sliding scale  . metoCLOPramide (REGLAN) 5 MG tablet One tab before each meal and one at bed time for dyspepsia prn  . metolazone (ZAROXOLYN) 5 MG tablet Take 5 mg by mouth 2 (two) times a week. As needed for leg swelling  . torsemide (DEMADEX) 20 MG tablet Take 2 tablets (40 mg) by mouth daily in the morning  . [DISCONTINUED] pantoprazole (PROTONIX) 40 MG tablet TAKE 1 TABLET BY MOUTH EVERY DAY  . Insulin Pen Needle (B-D ULTRAFINE III SHORT PEN) 31G X 8 MM MISC To use with pen basal and mealtime coverage insulin pens 5 times daily. DX: E11.65   No facility-administered encounter medications on file as of 04/17/2020.    Surgical History: Past Surgical History:  Procedure Laterality Date  . bone graft surgery    . CARDIAC CATHETERIZATION  2013   S/p PCI   . CARDIAC CATHETERIZATION  2018   S/p CABG  . CARDIAC SURGERY    . CORONARY ARTERY BYPASS GRAFT  2018   (LIMA-LAD,VG-RCA,VG-OM1,VG-D1)  . ESOPHAGOGASTRODUODENOSCOPY (EGD) WITH PROPOFOL N/A 04/04/2020   Procedure: ESOPHAGOGASTRODUODENOSCOPY (EGD) WITH PROPOFOL;  Surgeon: Lucilla Lame, MD;  Location: [facility] ENDOSCOPY;  Service: Endoscopy;  Laterality: N/A;  . EYE SURGERY    . PERIPHERAL VASCULAR CATHETERIZATION Right 10/28/2016   PTA/DEB Right SFA  . WRIST SURGERY      Medical History: Past Medical History:  Diagnosis Date  . Arrhythmia   . Arthritis   .  CAD (coronary artery disease)    a. 12/2016 s/p CABG x 4 (LIMA->LAD, VG->RCA, VG->OM1, VG->D1); b. 02/2018 MV: EF 35%, small-med inferolaterlal infarct. No ischemia.  . Colon polyps   . Diabetes ([place])   . Dupre's syndrome   . GERD (gastroesophageal reflux disease)   . HFimpEF (heart failure with improved ejection fraction) ([place])    a. 2018 EF 35%; b. 02/2018 EF 50%; c. 02/2019 EF 40-45%; d. 08/2019 Echo: EF 50-55%, no rwma, Gr2 DD. Nl RV size/fxn. Triv MR. Mild Ao sclerosis.  . Hyperlipidemia   . Hypertension   . Ischemic cardiomyopathy    a. 02/2018 MV: EF 35%; b. 08/2019 Echo:  EF 50-55%.  . Lymphedema   . Migraine   . Neuropathy   . PAD (peripheral artery disease) ([place])    a. 04/2017 Aortoiliac duplex: R iliac dzs.  . Peripheral vascular disease ([place])   . Retinopathy   . Sleep apnea   . Stage 3 chronic kidney disease ([place])   . Stomach ulcer     Family History: Family History  Problem Relation Age of Onset  . Diabetes Mother   . Heart disease Father     Social History   Socioeconomic History  . Marital status: Married    Spouse name: Not on file  . Number of children: Not on file  . Years of education: Not on file  . Highest education level: Not on file  Occupational History  . Not on file  Tobacco Use  . Smoking status: Former Smoker    Types: Cigarettes    Quit date: 05/18/2012    Years since quitting: 7.9  . Smokeless tobacco: Never Used  Vaping Use  . Vaping Use: Never used  Substance and Sexual Activity  . Alcohol use: Yes    Comment: very rarely  . Drug use: Never  . Sexual activity: Not on file  Other Topics Concern  . Not on file  Social History Narrative  . Not on file   Social Determinants of Health   Financial Resource Strain:   . Difficulty of Paying Living Expenses: Not on file  Food Insecurity:   . Worried About Charity fundraiser in the Last Year: Not on file  . Ran Out of Food in the Last Year: Not on file  Transportation Needs:   . Lack of Transportation (Medical): Not on file  . Lack of Transportation (Non-Medical): Not on file  Physical Activity:   . Days of Exercise per Week: Not on file  . Minutes of Exercise per Session: Not on file  Stress:   . Feeling of Stress : Not on file  Social Connections:   . Frequency of Communication with Friends and Family: Not on file  . Frequency of Social Gatherings with Friends and Family: Not on file  . Attends Religious Services: Not on file  . Active Member of Clubs or Organizations: Not on file  . Attends Archivist Meetings: Not on file  . Marital  Status: Not on file  Intimate Partner Violence:   . Fear of Current or Ex-Partner: Not on file  . Emotionally Abused: Not on file  . Physically Abused: Not on file  . Sexually Abused: Not on file      Review of Systems  Constitutional: Positive for activity change and fatigue. Negative for chills and unexpected weight change.  HENT: Negative for congestion, postnasal drip, rhinorrhea, sneezing and sore throat.   Respiratory: Positive for shortness of breath. Negative  for cough, chest tightness and wheezing.   Cardiovascular: Negative for chest pain and palpitations.  Gastrointestinal: Positive for nausea. Negative for abdominal pain, constipation, diarrhea and vomiting.  Endocrine: Negative for cold intolerance, heat intolerance, polydipsia and polyuria.       Big fluctuations in blood sugars. States they have never been well controlled.   Musculoskeletal: Positive for arthralgias, back pain, gait problem, myalgias and neck pain. Negative for joint swelling.  Skin: Negative for rash.  Allergic/Immunologic: Negative for environmental allergies.  Neurological: Positive for dizziness, weakness and headaches. Negative for tremors and numbness.  Hematological: Negative for adenopathy. Does not bruise/bleed easily.  Psychiatric/Behavioral: Negative for behavioral problems (Depression), sleep disturbance and suicidal ideas. The patient is not nervous/anxious.     Today's Vitals   04/17/20 1543  BP: (!) 148/86  Pulse: 81  Resp: 16  Temp: 97.8 F (36.6 C)  SpO2: 97%  Weight: 203 lb (92.1 kg)  Height: 6\' 2"  (1.88 m)   Body mass index is 26.06 kg/m.  Physical Exam Vitals and nursing note reviewed.  Constitutional:      General: He is not in acute distress.    Appearance: Normal appearance. He is well-developed. He is not diaphoretic.  HENT:     Head: Normocephalic and atraumatic.     Nose: Nose normal.     Mouth/Throat:     Pharynx: No oropharyngeal exudate.  Eyes:     Pupils:  Pupils are equal, round, and reactive to light.  Neck:     Thyroid: No thyromegaly.     Vascular: No carotid bruit or JVD.     Trachea: No tracheal deviation.  Cardiovascular:     Rate and Rhythm: Normal rate and regular rhythm.     Heart sounds: Normal heart sounds. No murmur heard.  No friction rub. No gallop.   Pulmonary:     Effort: Pulmonary effort is normal. No respiratory distress.     Breath sounds: Normal breath sounds. No wheezing or rales.  Chest:     Chest wall: No tenderness.  Abdominal:     Palpations: Abdomen is soft.  Musculoskeletal:        General: Normal range of motion.     Cervical back: Normal range of motion and neck supple. Pain with movement and spinous process tenderness present.  Lymphadenopathy:     Cervical: No cervical adenopathy.  Skin:    General: Skin is warm and dry.     Capillary Refill: Capillary refill takes less than 2 seconds.  Neurological:     General: No focal deficit present.     Mental Status: He is alert and oriented to person, place, and time.     Cranial Nerves: No cranial nerve deficit.     Gait: Gait abnormal.     Comments: Abnormal gait. Using a cane to help with ambulation.   Psychiatric:        Mood and Affect: Mood normal.        Behavior: Behavior normal.        Thought Content: Thought content normal.        Judgment: Judgment normal.    Assessment/Plan:  1. Cervicalgia Reviewed x-ray of cervical spine with the patient. He has advanced degenerative disc disease C4-5 and C5-6 with at least mild left neural foraminal narrowing at this level. Refer to both neurosurgery and pain management for further evaluation and treatment . - Ambulatory referral to Neurosurgery - Ambulatory referral to Pain Clinic  2. Cervical disc disease  with myelopathy Reviewed x-ray of cervical spine with the patient. He has advanced degenerative disc disease C4-5 and C5-6 with at least mild left neural foraminal narrowing at this level. Refer to  both neurosurgery and pain management for further evaluation and treatment . - Ambulatory referral to Neurosurgery - Ambulatory referral to Pain Clinic  3. Type 2 diabetes mellitus with diabetic neuropathy, with long-term current use of insulin (HCC) Continue insulin and diabetic medications as prescribed - Insulin Pen Needle (B-D ULTRAFINE III SHORT PEN) 31G X 8 MM MISC; To use with pen basal and mealtime coverage insulin pens 5 times daily. DX: E11.65  Dispense: 500 each; Refill: 1  4. Chronic obstructive pulmonary disease, unspecified COPD type ([place]) continue inhalers and respiratory medications as prescribed.   General Counseling: mickle campton understanding of the findings of todays visit and agrees with plan of treatment. I have discussed any further diagnostic evaluation that may be needed or ordered today. We also reviewed his medications today. he has been encouraged to call the office with any questions or concerns that should arise related to todays visit.  This patient was seen by Leretha Pol FNP Collaboration with Dr [name] as a part of collaborative care agreement  Orders Placed This Encounter  Procedures  . Ambulatory referral to Neurosurgery  . Ambulatory referral to Pain Clinic    Meds ordered this encounter  Medications  . Insulin Pen Needle (B-D ULTRAFINE III SHORT PEN) 31G X 8 MM MISC    Sig: To use with pen basal and mealtime coverage insulin pens 5 times daily. DX: E11.65    Dispense:  500 each    Refill:  1    Order Specific Question:   Supervising Provider    Answer:   [name] [[id number]]    Total time spent: 30 Minutes   Time spent includes review of chart, medications, test results, and follow up plan with the patient.      Dr [name] Internal medicine

## 2020-04-17 NOTE — Telephone Encounter (Signed)
Pt has OV 04/17/20

## 2020-04-17 NOTE — Telephone Encounter (Signed)
Left vm for pt to return my call to schedule a follow up appt to review EGD results.

## 2020-04-18 NOTE — Telephone Encounter (Signed)
Pt scheduled for a follow up appt on Dec 7th.

## 2020-04-23 ENCOUNTER — Other Ambulatory Visit: Payer: Self-pay | Admitting: Internal Medicine

## 2020-04-23 DIAGNOSIS — K219 Gastro-esophageal reflux disease without esophagitis: Secondary | ICD-10-CM
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
## 2020-04-25 ENCOUNTER — Other Ambulatory Visit: Payer: Self-pay | Admitting: Internal Medicine

## 2020-04-25 ENCOUNTER — Other Ambulatory Visit: Payer: Self-pay

## 2020-04-25 ENCOUNTER — Encounter: Payer: Self-pay | Admitting: Cardiovascular Disease

## 2020-04-25 ENCOUNTER — Ambulatory Visit: Payer: Managed Care, Other (non HMO) | Admitting: Cardiovascular Disease

## 2020-04-25 VITALS — BP 150/80 | HR 89 | Ht 73.0 in | Wt 203.0 lb

## 2020-04-25 DIAGNOSIS — I10 Essential (primary) hypertension: Secondary | ICD-10-CM

## 2020-04-25 DIAGNOSIS — E118 Type 2 diabetes mellitus with unspecified complications: Secondary | ICD-10-CM

## 2020-04-25 DIAGNOSIS — T466X5A Adverse effect of antihyperlipidemic and antiarteriosclerotic drugs, initial encounter: Secondary | ICD-10-CM

## 2020-04-25 DIAGNOSIS — I5020 Unspecified systolic (congestive) heart failure: Secondary | ICD-10-CM | POA: Diagnosis not present

## 2020-04-25 DIAGNOSIS — I255 Ischemic cardiomyopathy: Secondary | ICD-10-CM

## 2020-04-25 DIAGNOSIS — I25118 Atherosclerotic heart disease of native coronary artery with other forms of angina pectoris: Secondary | ICD-10-CM | POA: Diagnosis not present

## 2020-04-25 DIAGNOSIS — E785 Hyperlipidemia, unspecified: Secondary | ICD-10-CM

## 2020-04-25 DIAGNOSIS — K219 Gastro-esophageal reflux disease without esophagitis: Secondary | ICD-10-CM

## 2020-04-25 DIAGNOSIS — N1832 Chronic kidney disease, stage 3b: Secondary | ICD-10-CM

## 2020-04-25 DIAGNOSIS — Z794 Long term (current) use of insulin: Secondary | ICD-10-CM

## 2020-04-25 DIAGNOSIS — M791 Myalgia, unspecified site: Secondary | ICD-10-CM

## 2020-04-25 NOTE — Patient Instructions (Addendum)
Medication Instructions:  Take metolazone sparingly as needed for swelling.   If you need a refill on your cardiac medications before your next appointment, please call your pharmacy.    Lab work: No new labs needed   If you have labs (blood work) drawn today and your tests are completely normal, you will receive your results only by: Marland Kitchen MyChart Message (if you have MyChart) OR . A paper copy in the mail If you have any lab test that is abnormal or we need to change your treatment, we will call you to review the results.   Testing/Procedures: No new testing needed   Follow-Up: At [facility], you and your health needs are our priority.  As part of our continuing mission to provide you with exceptional heart care, we have created designated Provider Care Teams.  These Care Teams include your primary Cardiologist (physician) and Advanced Practice Providers (APPs -  Physician Assistants and Nurse Practitioners) who all work together to provide you with the care you need, when you need it.  . You will need a follow up appointment in 6 months, app ok  . Providers on your designated Care Team:   . Murray Hodgkins, NP . Christell Faith, PA-C . Marrianne Mood, PA-C  Any Other Special Instructions Will Be Listed Below (If Applicable).  COVID-19 Vaccine Information can be found at: ShippingScam.co.uk For questions related to vaccine distribution or appointments, please email [email or web address] or call [phone].

## 2020-04-26 ENCOUNTER — Ambulatory Visit: Payer: Managed Care, Other (non HMO)

## 2020-04-26 DIAGNOSIS — M5000 Cervical disc disorder with myelopathy, unspecified cervical region: Secondary | ICD-10-CM | POA: Insufficient documentation

## 2020-04-29 ENCOUNTER — Other Ambulatory Visit: Payer: Self-pay | Admitting: Internal Medicine

## 2020-04-29 DIAGNOSIS — E1065 Type 1 diabetes mellitus with hyperglycemia: Secondary | ICD-10-CM

## 2020-05-01 ENCOUNTER — Other Ambulatory Visit: Payer: Self-pay | Admitting: Nurse Practitioner

## 2020-05-01 ENCOUNTER — Telehealth: Payer: Self-pay

## 2020-05-01 DIAGNOSIS — E1140 Type 2 diabetes mellitus with diabetic neuropathy, unspecified: Secondary | ICD-10-CM

## 2020-05-01 MED ORDER — TRESIBA FLEXTOUCH 200 UNIT/ML ~~LOC~~ SOPN: 140.0000 [IU] | PEN_INJECTOR | Freq: Every day | SUBCUTANEOUS | 5 refills | Status: DC

## 2020-05-01 NOTE — Telephone Encounter (Signed)
I changed the concentration to the 200units/ml concentration. I have in his med list that he is taking 140units daily. The dose does not change if he has the 100units/ml. It will just go faster. New prescription was sent to his pharmacy. He needs to be seeing endocrinology. Also, if he got from endocrinology, why is he upset that we prescribed the wrong concentration?

## 2020-05-01 NOTE — Telephone Encounter (Signed)
Can you please call him?

## 2020-05-02 ENCOUNTER — Telehealth: Payer: Self-pay

## 2020-05-02 ENCOUNTER — Encounter: Payer: Self-pay | Admitting: Gastroenterology

## 2020-05-02 ENCOUNTER — Ambulatory Visit (INDEPENDENT_AMBULATORY_CARE_PROVIDER_SITE_OTHER): Payer: Managed Care, Other (non HMO) | Admitting: Gastroenterology

## 2020-05-02 ENCOUNTER — Other Ambulatory Visit: Payer: Self-pay

## 2020-05-02 VITALS — BP 136/77 | HR 88 | Ht 73.0 in | Wt 207.6 lb

## 2020-05-02 DIAGNOSIS — K2100 Gastro-esophageal reflux disease with esophagitis, without bleeding: Secondary | ICD-10-CM | POA: Diagnosis not present

## 2020-05-02 DIAGNOSIS — K219 Gastro-esophageal reflux disease without esophagitis: Secondary | ICD-10-CM

## 2020-05-02 MED ORDER — PANTOPRAZOLE SODIUM 40 MG PO TBEC: 40.0000 mg | DELAYED_RELEASE_TABLET | Freq: Two times a day (BID) | ORAL | 1 refills | Status: DC

## 2020-05-02 MED ORDER — FAMOTIDINE 20 MG PO TABS: 20.0000 mg | ORAL_TABLET | Freq: Two times a day (BID) | ORAL | 0 refills | Status: DC

## 2020-05-02 NOTE — Telephone Encounter (Signed)
Called and spoke to pt and informed him that he can still use the insulin but he decided to only use the 200 units/ml.  I also informed him that he needs to be seeing endo and he needs to make an appointment with them to follow him with his diabetes.

## 2020-05-02 NOTE — Telephone Encounter (Signed)
Dr. Allen Norris states pantoprazole 40mg  twice a day was supposed to be called in for the patient not Pepcid. Sent Pantoprazole to the pharmacy and called the pharmacy and cancel the pepcid

## 2020-05-02 NOTE — Progress Notes (Signed)
Primary Care Physician: [name], MD  Primary Gastroenterologist:  Dr. Lucilla Lame  Chief Complaint  Patient presents with  . Follow up EGD results    HPI: [name] [name] is a 59 y.o. male here for follow-up after having an upper endoscopy.  The patient had a nodule at the distal esophagus right at the GE junction that showed:  DIAGNOSIS:  A. GEJ; COLD BIOPSY:  - BARRETT'S ESOPHAGUS WITH LOW-GRADE DYSPLASIA.  - NEGATIVE FOR MALIGNANCY.   The patient reports that he has been doing well without any complaints at the present time.  The patient is on Protonix 1 mg a day.  He denies any abdominal pain nausea vomiting fevers chills black stools or bloody stools.  He also denies any dysphagia  Past Medical History:  Diagnosis Date  . Arrhythmia   . Arthritis   . CAD (coronary artery disease)    a. 12/2016 s/p CABG x 4 (LIMA->LAD, VG->RCA, VG->OM1, VG->D1); b. 02/2018 MV: EF 35%, small-med inferolaterlal infarct. No ischemia.  . Colon polyps   . Diabetes ([place])   . Dupre's syndrome   . GERD (gastroesophageal reflux disease)   . HFimpEF (heart failure with improved ejection fraction) ([place])    a. 2018 EF 35%; b. 02/2018 EF 50%; c. 02/2019 EF 40-45%; d. 08/2019 Echo: EF 50-55%, no rwma, Gr2 DD. Nl RV size/fxn. Triv MR. Mild Ao sclerosis.  . Hyperlipidemia   . Hypertension   . Ischemic cardiomyopathy    a. 02/2018 MV: EF 35%; b. 08/2019 Echo: EF 50-55%.  . Lymphedema   . Migraine   . Neuropathy   . PAD (peripheral artery disease) ([place])    a. 04/2017 Aortoiliac duplex: R iliac dzs.  . Peripheral vascular disease ([place])   . Retinopathy   . Sleep apnea   . Stage 3 chronic kidney disease ([place])   . Stomach ulcer     Current Outpatient Medications  Medication Sig Dispense Refill  . albuterol (ACCUNEB) 0.63 MG/3ML nebulizer solution Inhale into the lungs.    . Alirocumab (PRALUENT) 75 MG/ML SOAJ Inject 75 mg into the skin every 14 (fourteen) days. 2 pen 11  . carvedilol (COREG)  12.5 MG tablet Take 12.5 mg by mouth 2 (two) times daily with a meal.    . CLINPRO 5000 1.1 % PSTE Place onto teeth at bedtime.    . cyclobenzaprine (FLEXERIL) 10 MG tablet Take one tab at night for neck spasm prn 15 tablet 0  . diclofenac Sodium (VOLTAREN) 1 % GEL Apply 2 g topically in the morning and at bedtime. To the neck muscle 150 g 1  . ezetimibe (ZETIA) 10 MG tablet TAKE 1 TABLET BY MOUTH EVERY DAY 90 tablet 1  . fluticasone furoate-vilanterol (BREO ELLIPTA) 100-25 MCG/INH AEPB Inhale 1 puff into the lungs daily.    . insulin degludec (TRESIBA FLEXTOUCH) 200 UNIT/ML FlexTouch Pen Inject 140 Units into the skin daily. 21 mL 5  . insulin lispro (HUMALOG KWIKPEN) 100 UNIT/ML KwikPen Inject into the skin. 0-20 units per sliding scale    . Insulin Pen Needle (B-D ULTRAFINE III SHORT PEN) 31G X 8 MM MISC To use with pen basal and mealtime coverage insulin pens 5 times daily. DX: E11.65 500 each 1  . metoCLOPramide (REGLAN) 5 MG tablet One tab before each meal and one at bed time for dyspepsia prn 120 tablet 2  . metolazone (ZAROXOLYN) 5 MG tablet Take 5 mg by mouth 2 (two) times a week. As needed  for leg swelling    . pantoprazole (PROTONIX) 40 MG tablet TAKE 1 TABLET BY MOUTH EVERY DAY 90 tablet 1  . torsemide (DEMADEX) 20 MG tablet Take 2 tablets (40 mg) by mouth daily in the morning    . famotidine (PEPCID) 20 MG tablet Take 1 tablet (20 mg total) by mouth 2 (two) times daily. 60 tablet 0   No current facility-administered medications for this visit.    Allergies as of 05/02/2020 - Review Complete 05/02/2020  Allergen Reaction Noted  . Other Other (See Comments) 05/19/2019  . Statins  05/19/2019  . Atorvastatin Other (See Comments) 05/31/2019  . Entresto [sacubitril-valsartan]  12/02/2019  . Pravastatin Other (See Comments) 05/31/2019  . Pregabalin Other (See Comments) 05/31/2019  . Rosuvastatin Other (See Comments) 12/09/2012    ROS:  General: Negative for anorexia, weight loss,  fever, chills, fatigue, weakness. ENT: Negative for hoarseness, difficulty swallowing , nasal congestion. CV: Negative for chest pain, angina, palpitations, dyspnea on exertion, peripheral edema.  Respiratory: Negative for dyspnea at rest, dyspnea on exertion, cough, sputum, wheezing.  GI: See history of present illness. GU:  Negative for dysuria, hematuria, urinary incontinence, urinary frequency, nocturnal urination.  Endo: Negative for unusual weight change.    Physical Examination:   BP 136/77   Pulse 88   Ht 6\' 1"  (1.854 m)   Wt 207 lb 9.6 oz (94.2 kg)   BMI 27.39 kg/m   General: Well-nourished, well-developed in no acute distress.  Eyes: No icterus. Conjunctivae pink. Neuro: Alert and oriented x 3.  Grossly intact. Skin: Warm and dry, no jaundice.   Psych: Alert and cooperative, normal mood and affect.  Labs:    Imaging Studies: VAS [place] LOWER EXTREMITY ARTERIAL DUPLEX  Result Date: 04/11/2020 LOWER EXTREMITY ARTERIAL DUPLEX STUDY  Current ABI: 10/06/2019 r=.96; l = .93 Performing Technologist: Concha [place] RVT  Examination Guidelines: A complete evaluation includes B-mode imaging, spectral Doppler, color Doppler, and power Doppler as needed of all accessible portions of each vessel. Bilateral testing is considered an integral part of a complete examination. Limited examinations for reoccurring indications may be performed as noted.  +----------+--------+-----+--------+--------+--------+ RIGHT     PSV cm/sRatioStenosisWaveformComments +----------+--------+-----+--------+--------+--------+ CFA Mid   71                   biphasic         +----------+--------+-----+--------+--------+--------+ DFA       47                   biphasic         +----------+--------+-----+--------+--------+--------+ SFA Prox  64                   biphasic         +----------+--------+-----+--------+--------+--------+ SFA Mid   66                   biphasic          +----------+--------+-----+--------+--------+--------+ SFA Distal102                                   +----------+--------+-----+--------+--------+--------+ POP Distal75                   biphasic         +----------+--------+-----+--------+--------+--------+ ATA Distal39                   biphasic         +----------+--------+-----+--------+--------+--------+  PTA Distal62                   biphasic         +----------+--------+-----+--------+--------+--------+  +----------+--------+-----+--------+----------+--------+ LEFT      PSV cm/sRatioStenosisWaveform  Comments +----------+--------+-----+--------+----------+--------+ CFA Mid   61                   biphasic           +----------+--------+-----+--------+----------+--------+ DFA       62                   monophasic         +----------+--------+-----+--------+----------+--------+ SFA Prox  65                   biphasic           +----------+--------+-----+--------+----------+--------+ SFA Mid   75                   biphasic           +----------+--------+-----+--------+----------+--------+ SFA Distal48                   biphasic           +----------+--------+-----+--------+----------+--------+ POP Distal72                   biphasic           +----------+--------+-----+--------+----------+--------+ ATA Distal43                   biphasic           +----------+--------+-----+--------+----------+--------+ PTA Distal46                   biphasic           +----------+--------+-----+--------+----------+--------+  Summary: Right: Mild atherosclerosis throughout. Patent vessels throughout. Left: Mild atherosclerosis throughout. Patent vessels throughout.  See table(s) above for measurements and observations. Electronically signed by Leotis Pain MD on 04/11/2020 at 8:26:41 AM.    Final     Assessment and Plan:   [name] [name] is a 59 y.o. y/o male who comes in with a  finding at the GE junction consistent with low-grade dysplasia.  This is quite commonly over diagnosed at the GE junction and the patient will be treated with Protonix 40 mg twice daily with a repeat EGD in 3 to 4 weeks.  At that time biopsies of the esophagus will be repeated at the site of the nodule to see if the dysplasia was still there.  If it is then he will need resection of this nodule.  The patient has been explained the plan and agrees with it.     Lucilla Lame, MD. [facility]    Note: This dictation was prepared with Dragon dictation along with smaller phrase technology. Any transcriptional errors that result from this process are unintentional.

## 2020-05-05 ENCOUNTER — Other Ambulatory Visit: Payer: Managed Care, Other (non HMO)

## 2020-05-16 ENCOUNTER — Encounter: Payer: Self-pay | Admitting: Internal Medicine

## 2020-05-16 ENCOUNTER — Ambulatory Visit: Payer: Managed Care, Other (non HMO) | Admitting: Internal Medicine

## 2020-05-16 ENCOUNTER — Other Ambulatory Visit: Payer: Self-pay

## 2020-05-16 VITALS — BP 142/88 | HR 96 | Temp 97.1°F | Resp 16 | Ht 73.0 in | Wt 197.0 lb

## 2020-05-16 DIAGNOSIS — N1832 Chronic kidney disease, stage 3b: Secondary | ICD-10-CM

## 2020-05-16 DIAGNOSIS — I509 Heart failure, unspecified: Secondary | ICD-10-CM | POA: Diagnosis not present

## 2020-05-16 DIAGNOSIS — G4733 Obstructive sleep apnea (adult) (pediatric): Secondary | ICD-10-CM | POA: Diagnosis not present

## 2020-05-16 NOTE — Progress Notes (Signed)
[facility] [place], [place] [address]  Pulmonary Sleep Medicine   Office Visit Note  Patient Name: [name] [name] DOB: 1961-05-10 MRN [id number]  Date of Service: 05/26/2020  Complaints/HPI: Patient is here for routine pulmonary follow-up He is still not wearing his CPAP due to discomfort, felt as though he was not getting enough air and described it as suffocating Plan is to update echocardiogram, scheduled for this tomorrow, will follow-up with results after testing Breathing remains stable   ROS  General: (-) fever, (-) chills, (-) night sweats, (-) weakness Skin: (-) rashes, (-) itching,. Eyes: (-) visual changes, (-) redness, (-) itching. Nose and Sinuses: (-) nasal stuffiness or itchiness, (-) postnasal drip, (-) nosebleeds, (-) sinus trouble. Mouth and Throat: (-) sore throat, (-) hoarseness. Neck: (-) swollen glands, (-) enlarged thyroid, (-) neck pain. Respiratory: - cough, (-) bloody sputum, - shortness of breath, - wheezing. Cardiovascular: - ankle swelling, (-) chest pain. Lymphatic: (-) lymph node enlargement. Neurologic: (-) numbness, (-) tingling. Psychiatric: (-) anxiety, (-) depression   Current Medication: Outpatient Encounter Medications as of 05/16/2020  Medication Sig  . albuterol (ACCUNEB) 0.63 MG/3ML nebulizer solution Inhale into the lungs.  . Alirocumab (PRALUENT) 75 MG/ML SOAJ Inject 75 mg into the skin every 14 (fourteen) days.  . carvedilol (COREG) 12.5 MG tablet Take 12.5 mg by mouth 2 (two) times daily with a meal.  . diclofenac Sodium (VOLTAREN) 1 % GEL Apply 2 g topically in the morning and at bedtime. To the neck muscle  . ezetimibe (ZETIA) 10 MG tablet TAKE 1 TABLET BY MOUTH EVERY DAY  . fluticasone furoate-vilanterol (BREO ELLIPTA) 100-25 MCG/INH AEPB Inhale 1 puff into the lungs daily.  . insulin degludec (TRESIBA FLEXTOUCH) 200 UNIT/ML FlexTouch Pen Inject 140 Units into the skin daily.  . insulin lispro  (HUMALOG) 100 UNIT/ML KwikPen Inject into the skin. 0-20 units per sliding scale  . Insulin Pen Needle (B-D ULTRAFINE III SHORT PEN) 31G X 8 MM MISC To use with pen basal and mealtime coverage insulin pens 5 times daily. DX: E11.65  . metoCLOPramide (REGLAN) 5 MG tablet One tab before each meal and one at bed time for dyspepsia prn  . metolazone (ZAROXOLYN) 5 MG tablet Take 5 mg by mouth 2 (two) times a week. As needed for leg swelling  . pantoprazole (PROTONIX) 40 MG tablet Take 1 tablet (40 mg total) by mouth 2 (two) times daily before a meal.  . torsemide (DEMADEX) 20 MG tablet Take 2 tablets (40 mg) by mouth daily in the morning  . [DISCONTINUED] CLINPRO 5000 1.1 % PSTE Place onto teeth at bedtime. (Patient not taking: Reported on 05/24/2020)  . [DISCONTINUED] cyclobenzaprine (FLEXERIL) 10 MG tablet Take one tab at night for neck spasm prn (Patient not taking: Reported on 05/24/2020)   No facility-administered encounter medications on file as of 05/16/2020.    Surgical History: Past Surgical History:  Procedure Laterality Date  . bone graft surgery    . CARDIAC CATHETERIZATION  2013   S/p PCI   . CARDIAC CATHETERIZATION  2018   S/p CABG  . CARDIAC SURGERY    . CORONARY ARTERY BYPASS GRAFT  2018   (LIMA-LAD,VG-RCA,VG-OM1,VG-D1)  . ESOPHAGOGASTRODUODENOSCOPY (EGD) WITH PROPOFOL N/A 04/04/2020   Procedure: ESOPHAGOGASTRODUODENOSCOPY (EGD) WITH PROPOFOL;  Surgeon: Lucilla Lame, MD;  Location: [facility] ENDOSCOPY;  Service: Endoscopy;  Laterality: N/A;  . EYE SURGERY    . PERIPHERAL VASCULAR CATHETERIZATION Right 10/28/2016   PTA/DEB Right SFA  . WRIST  SURGERY      Medical History: Past Medical History:  Diagnosis Date  . Arrhythmia   . Arthritis   . CAD (coronary artery disease)    a. 12/2016 s/p CABG x 4 (LIMA->LAD, VG->RCA, VG->OM1, VG->D1); b. 02/2018 MV: EF 35%, small-med inferolaterlal infarct. No ischemia.  . Colon polyps   . Diabetes ([place])   . Dupre's syndrome   . GERD  (gastroesophageal reflux disease)   . HFimpEF (heart failure with improved ejection fraction) ([place])    a. 2018 EF 35%; b. 02/2018 EF 50%; c. 02/2019 EF 40-45%; d. 08/2019 Echo: EF 50-55%, no rwma, Gr2 DD. Nl RV size/fxn. Triv MR. Mild Ao sclerosis.  . Hyperlipidemia   . Hypertension   . Ischemic cardiomyopathy    a. 02/2018 MV: EF 35%; b. 08/2019 Echo: EF 50-55%.  . Lymphedema   . Migraine   . Neuropathy   . PAD (peripheral artery disease) ([place])    a. 04/2017 Aortoiliac duplex: R iliac dzs.  . Peripheral vascular disease ([place])   . Retinopathy   . Sleep apnea   . Stage 3 chronic kidney disease ([place])   . Stomach ulcer     Family History: Family History  Problem Relation Age of Onset  . Diabetes Mother   . Heart disease Father     Social History: Social History   Socioeconomic History  . Marital status: Married    Spouse name: Not on file  . Number of children: Not on file  . Years of education: Not on file  . Highest education level: Not on file  Occupational History  . Not on file  Tobacco Use  . Smoking status: Former Smoker    Types: Cigarettes    Quit date: 05/18/2012    Years since quitting: 8.0  . Smokeless tobacco: Never Used  Vaping Use  . Vaping Use: Never used  Substance and Sexual Activity  . Alcohol use: Yes    Comment: very rarely  . Drug use: Never  . Sexual activity: Not on file  Other Topics Concern  . Not on file  Social History Narrative  . Not on file   Social Determinants of Health   Financial Resource Strain: Not on file  Food Insecurity: Not on file  Transportation Needs: Not on file  Physical Activity: Not on file  Stress: Not on file  Social Connections: Not on file  Intimate Partner Violence: Not on file    Vital Signs: Blood pressure (!) 142/88, pulse 96, temperature (!) 97.1 F (36.2 C), resp. rate 16, height _0  (1.854 m), weight 197 lb (89.4 kg), SpO2 97 %.  Examination: General Appearance: The patient is  well-developed, well-nourished, and in no distress. Skin: Gross inspection of skin unremarkable. Head: normocephalic, no gross deformities. Eyes: no gross deformities noted. ENT: ears appear grossly normal no exudates. Neck: Supple. No thyromegaly. No LAD. Respiratory: Clear throughout, no wheezing, rales or rhonchi noted. Cardiovascular: Normal S1 and S2 without murmur or rub. Extremities: No cyanosis. pulses are equal. Neurologic: Alert and oriented. No involuntary movements.  LABS: Recent Results (from the past 2160 hour(s))  Basic metabolic panel     Status: Abnormal   Collection Time: 03/13/20 12:42 PM  Result Value Ref Range   Sodium 127 (L) 135 - 145 mmol/L   Potassium 3.1 (L) 3.5 - 5.1 mmol/L   Chloride 83 (L) 98 - 111 mmol/L   CO2 28 22 - 32 mmol/L   Glucose, Bld 561 (HH) 70 - 99  mg/dL    Comment: CRITICAL RESULT CALLED TO, READ BACK BY AND VERIFIED WITH  TIFFANY JOHNSON AT 1326 03/13/20 SDR Glucose reference range applies only to samples taken after fasting for at least 8 hours.    BUN 69 (H) 6 - 20 mg/dL   Creatinine, Ser 3.13 (H) 0.61 - 1.24 mg/dL   Calcium 9.0 8.9 - 10.3 mg/dL   GFR, Estimated 20 (L) >60 mL/min   Anion gap 16 (H) 5 - 15    Comment: Performed at [facility], [address] [place], [place] [address]  CBC     Status: None   Collection Time: 03/13/20 12:42 PM  Result Value Ref Range   WBC 10.0 4.0 - 10.5 K/uL   RBC 4.84 4.22 - 5.81 MIL/uL   Hemoglobin 14.9 13.0 - 17.0 g/dL   HCT 41.4 39.0 - 52.0 %   MCV 85.5 80.0 - 100.0 fL   MCH 30.8 26.0 - 34.0 pg   MCHC 36.0 30.0 - 36.0 g/dL   RDW 12.8 11.5 - 15.5 %   Platelets 334 150 - 400 K/uL   nRBC 0.0 0.0 - 0.2 %    Comment: Performed at [facility], [address] [place], [place] [address]  Blood gas, venous     Status: Abnormal   Collection Time: 03/13/20  2:15 PM  Result Value Ref Range   pH, Ven 7.48 (H) 7.250 - 7.430   pCO2, Ven 47 44.0 - 60.0 mmHg   pO2, Ven 41.0 32.0  - 45.0 mmHg   Bicarbonate 35.0 (H) 20.0 - 28.0 mmol/L   Acid-Base Excess 9.9 (H) 0.0 - 2.0 mmol/L   O2 Saturation 80.4 %   Patient temperature 37.0    Collection site VEIN    Sample type VEIN     Comment: Performed at [facility], [address] [place], [place] [address]  Basic metabolic panel     Status: Abnormal   Collection Time: 03/13/20  3:30 PM  Result Value Ref Range   Sodium 132 (L) 135 - 145 mmol/L   Potassium 2.6 (LL) 3.5 - 5.1 mmol/L    Comment: CRITICAL RESULT CALLED TO, READ BACK BY AND VERIFIED WITH KELLY WILLIAMS _0  ON 03/13/20 SKL    Chloride 90 (L) 98 - 111 mmol/L   CO2 30 22 - 32 mmol/L   Glucose, Bld 374 (H) 70 - 99 mg/dL    Comment: Glucose reference range applies only to samples taken after fasting for at least 8 hours.   BUN 64 (H) 6 - 20 mg/dL   Creatinine, Ser 2.75 (H) 0.61 - 1.24 mg/dL   Calcium 8.3 (L) 8.9 - 10.3 mg/dL   GFR, Estimated 24 (L) >60 mL/min   Anion gap 12 5 - 15    Comment: Performed at [facility], [address] [place], [place] [address]  Glucose, capillary     Status: Abnormal   Collection Time: 03/13/20  4:40 PM  Result Value Ref Range   Glucose-Capillary 331 (H) 70 - 99 mg/dL    Comment: Glucose reference range applies only to samples taken after fasting for at least 8 hours.  SARS CORONAVIRUS 2 (TAT 6-24 HRS) Nasopharyngeal Nasopharyngeal Swab     Status: None   Collection Time: 03/31/20 12:42 PM   Specimen: Nasopharyngeal Swab  Result Value Ref Range   SARS Coronavirus 2 NEGATIVE NEGATIVE    Comment: (NOTE) SARS-CoV-2 target nucleic acids are NOT DETECTED.  The SARS-CoV-2 RNA is generally  detectable in upper and lower respiratory specimens during the acute phase of infection. Negative results do not preclude SARS-CoV-2 infection, do not rule out co-infections with other pathogens, and should not be used as the sole basis for treatment or other patient management decisions. Negative results must be  combined with clinical observations, patient history, and epidemiological information. The expected result is Negative.  Fact Sheet for Patients: SugarRoll.be  Fact Sheet for Healthcare Providers: https://www.woods-mathews.com/  This test is not yet approved or cleared by the [place] FDA and  has been authorized for detection and/or diagnosis of SARS-CoV-2 by FDA under an Emergency Use Authorization (EUA). This EUA will remain  in effect (meaning this test can be used) for the duration of the COVID-19 declaration under Se ction 564(b)(1) of the Act, 21 U.S.C. section 360bbb-3(b)(1), unless the authorization is terminated or revoked sooner.  Performed at [facility], [place] [address], [place], [place] [address]   Glucose, capillary     Status: Abnormal   Collection Time: 04/04/20 10:22 AM  Result Value Ref Range   Glucose-Capillary 416 (H) 70 - 99 mg/dL    Comment: Glucose reference range applies only to samples taken after fasting for at least 8 hours.  Surgical pathology     Status: None   Collection Time: 04/04/20 10:44 AM  Result Value Ref Range   SURGICAL PATHOLOGY      SURGICAL PATHOLOGY CASE: [id number] PATIENT: [name] Surgical Pathology Report     Specimen Submitted: A. GEJ; cbx  Clinical History: GERD K21.9      DIAGNOSIS: A. GEJ; COLD BIOPSY: - BARRETT'S ESOPHAGUS WITH LOW-GRADE DYSPLASIA. - NEGATIVE FOR MALIGNANCY.   GROSS DESCRIPTION: A. Labeled: cbx GEJ Received: Formalin Tissue fragment(s): 2 Size: Range from 0.3-0.4 cm Description: Tan soft tissue fragments Entirely submitted in 1 cassette.     Final Diagnosis performed by Betsy Pries, MD.   Electronically signed 04/05/2020 11:41:26AM The electronic signature indicates that the named Attending Pathologist has evaluated the specimen Technical component performed at [facility], [address], [place], [place] [address] Lab:  [phone] Dir: Rush Farmer, MD, MMM  Professional component performed at [facility], [facility], [place], [place], [place] [address] Lab: [phone] Dir: Dellia Nims. Rubinas, MD   Glucose, capillary     Status: Abnormal   Collection Time: 04/04/20 11:18 AM  Result Value Ref Range   Glucose-Capillary 392 (H) 70 - 99 mg/dL    Comment: Glucose reference range applies only to samples taken after fasting for at least 8 hours.  ECHOCARDIOGRAM COMPLETE     Status: None   Collection Time: 05/17/20 10:00 AM  Result Value Ref Range   AR max vel 1.98 cm2   AV Peak grad 11.8 mmHg   Ao pk vel 1.72 m/s   S' Lateral 4.40 cm   Area-P 1/2 4.31 cm2   AV Area VTI 1.86 cm2   AV Mean grad 7.0 mmHg   Single Plane A4C EF 30.3 %   Single Plane A2C EF 32.6 %   Calc EF 32.5 %   AV Area mean vel 1.87 cm2  Comp. Metabolic Panel (12)     Status: Abnormal   Collection Time: 05/24/20  4:05 PM  Result Value Ref Range   Glucose 171 (H) 65 - 99 mg/dL   BUN 41 (H) 8 - 27 mg/dL   Creatinine, Ser 2.53 (H) 0.76 - 1.27 mg/dL   GFR calc non Af Amer 26 (L) >59 mL/min/1.73  GFR calc Af Amer 30 (L) >59 mL/min/1.73    Comment: **In accordance with recommendations from the NKF-ASN Task force,**   [facility] is in the process of updating its eGFR calculation to the   2021 CKD-EPI creatinine equation that estimates kidney function   without a race variable.    BUN/Creatinine Ratio 16 10 - 24   Sodium 139 134 - 144 mmol/L   Potassium 4.5 3.5 - 5.2 mmol/L   Chloride 100 96 - 106 mmol/L   Calcium 9.3 8.6 - 10.2 mg/dL   Total Protein 6.9 6.0 - 8.5 g/dL   Albumin 3.7 (L) 3.8 - 4.8 g/dL   Globulin, Total 3.2 1.5 - 4.5 g/dL   Albumin/Globulin Ratio 1.2 1.2 - 2.2   Bilirubin Total 0.3 0.0 - 1.2 mg/dL   Alkaline Phosphatase 105 44 - 121 IU/L    Comment:               **Please note reference interval change**   AST 19 0 - 40 IU/L  Magnesium     Status: None   Collection Time: 05/24/20  4:05 PM   Result Value Ref Range   Magnesium 2.1 1.6 - 2.3 mg/dL  Vitamin B12     Status: None   Collection Time: 05/24/20  4:05 PM  Result Value Ref Range   Vitamin B-12 749 232 - 1,245 pg/mL  Sedimentation rate     Status: Abnormal   Collection Time: 05/24/20  4:05 PM  Result Value Ref Range   Sed Rate 72 (H) 0 - 30 mm/hr  25-Hydroxy vitamin D Lcms D2+D3     Status: None (Preliminary result)   Collection Time: 05/24/20  4:05 PM  Result Value Ref Range   25-Hydroxy, Vitamin D WILL FOLLOW    25-Hydroxy, Vitamin D-2 WILL FOLLOW    25-Hydroxy, Vitamin D-3 WILL FOLLOW   C-reactive protein     Status: None   Collection Time: 05/24/20  4:05 PM  Result Value Ref Range   CRP 7 0 - 10 mg/L    Radiology: No results found.  No results found.  ECHOCARDIOGRAM COMPLETE  Result Date: 05/17/2020    ECHOCARDIOGRAM REPORT   Patient Name:   BANJAMIN STOVALL Date of Exam: 05/17/2020 Medical Rec #:  [id number]       Height:       73.0 in Accession #:    [id number]      Weight:       197.0 lb Date of Birth:  [birth year]      BSA:          2.138 m Patient Age:    59 years        BP:           136/77 mmHg Patient Gender: M               HR:           76 bpm. Exam Location:  [place] Procedure: 2D Echo, 3D Echo, Cardiac Doppler, Color Doppler and Strain Analysis Indications:    R06.02 SOB; R55 Syncope; R60.0 Lower extremity edema  History:        Patient has prior history of Echocardiogram examinations, most                 recent 08/26/2019. CHF, CAD, Prior CABG, PAD and COPD,                 Signs/Symptoms:Syncope, Shortness of Breath and Edema; Risk  Factors:Diabetes, Sleep Apnea, Former Smoker and Hypertension.  Sonographer:    Pilar Jarvis RDMS, RVT, RDCS Referring Phys: [id number] SAADAT A KHAN IMPRESSIONS  1. Left ventricular ejection fraction, by estimation, is 30 to 35%. The left ventricle has moderate to severely decreased function. The left ventricle demonstrates global hypokinesis. There is mild  left ventricular hypertrophy. Left ventricular diastolic parameters are consistent with Grade II diastolic dysfunction (pseudonormalization). The average left ventricular global longitudinal strain is -9.2 %. The global longitudinal strain is abnormal.  2. Right ventricular systolic function is low normal. The right ventricular size is normal.  3. Left atrial size was mildly dilated.  4. The mitral valve is degenerative. Mild mitral valve regurgitation.  5. The aortic valve is tricuspid. Aortic valve regurgitation is not visualized. Mild to moderate aortic valve sclerosis/calcification is present, without any evidence of aortic stenosis.  6. The inferior vena cava is dilated in size with >50% respiratory variability, suggesting right atrial pressure of 8 mmHg. FINDINGS  Left Ventricle: 3D LVEF performed but not reported due to suboptimal tracking. Left ventricular ejection fraction, by estimation, is 30 to 35%. The left ventricle has moderate to severely decreased function. The left ventricle demonstrates global hypokinesis. The average left ventricular global longitudinal strain is -9.2 %. The global longitudinal strain is abnormal. The left ventricular internal cavity size was normal in size. There is mild left ventricular hypertrophy. Left ventricular diastolic parameters are consistent with Grade II diastolic dysfunction (pseudonormalization). Right Ventricle: The right ventricular size is normal. No increase in right ventricular wall thickness. Right ventricular systolic function is low normal. Left Atrium: Left atrial size was mildly dilated. Right Atrium: Right atrial size was normal in size. Pericardium: There is no evidence of pericardial effusion. Mitral Valve: The mitral valve is degenerative in appearance. There is mild thickening of the mitral valve leaflet(s). Mild mitral annular calcification. Mild mitral valve regurgitation. Tricuspid Valve: The tricuspid valve is normal in structure. Tricuspid valve  regurgitation is not demonstrated. Aortic Valve: The aortic valve is tricuspid. Aortic valve regurgitation is not visualized. Mild to moderate aortic valve sclerosis/calcification is present, without any evidence of aortic stenosis. Aortic valve mean gradient measures 7.0 mmHg. Aortic valve peak gradient measures 11.8 mmHg. Aortic valve area, by VTI measures 1.86 cm. Pulmonic Valve: The pulmonic valve was normal in structure. Pulmonic valve regurgitation is not visualized. Aorta: The aortic root is normal in size and structure. Venous: The inferior vena cava is dilated in size with greater than 50% respiratory variability, suggesting right atrial pressure of 8 mmHg. IAS/Shunts: No atrial level shunt detected by color flow Doppler.  LEFT VENTRICLE PLAX 2D LVIDd:         5.50 cm      Diastology LVIDs:         4.40 cm      LV e' medial:    3.26 cm/s LV PW:         1.40 cm      LV E/e' medial:  32.5 LV IVS:        1.30 cm      LV e' lateral:   8.59 cm/s LVOT diam:     2.30 cm      LV E/e' lateral: 12.3 LV SV:         63 LV SV Index:   30           2D Longitudinal Strain LVOT Area:     4.15 cm     2D Strain GLS Avg:     -  9.2 %  LV Volumes (MOD) LV vol d, MOD A2C: 135.0 ml LV vol d, MOD A4C: 143.0 ml LV vol s, MOD A2C: 91.0 ml LV vol s, MOD A4C: 99.6 ml LV SV MOD A2C:     44.0 ml LV SV MOD A4C:     143.0 ml LV SV MOD BP:      45.9 ml RIGHT VENTRICLE            IVC RV Basal diam:  3.80 cm    IVC diam: 2.20 cm RV S prime:     7.94 cm/s TAPSE (M-mode): 1.7 cm LEFT ATRIUM             Index       RIGHT ATRIUM           Index LA diam:        4.40 cm 2.06 cm/m  RA Area:     14.50 cm LA Vol (A2C):   61.7 ml 28.86 ml/m RA Volume:   34.80 ml  16.28 ml/m LA Vol (A4C):   69.4 ml 32.46 ml/m LA Biplane Vol: 65.9 ml 30.83 ml/m  AORTIC VALVE                    PULMONIC VALVE AV Area (Vmax):    1.98 cm     PV Vmax:       0.89 m/s AV Area (Vmean):   1.87 cm     PV Peak grad:  3.2 mmHg AV Area (VTI):     1.86 cm AV Vmax:            171.50 cm/s AV Vmean:          123.500 cm/s AV VTI:            0.339 m AV Peak Grad:      11.8 mmHg AV Mean Grad:      7.0 mmHg LVOT Vmax:         81.60 cm/s LVOT Vmean:        55.500 cm/s LVOT VTI:          0.152 m LVOT/AV VTI ratio: 0.45  AORTA Ao Root diam: 3.50 cm Ao Asc diam:  3.40 cm Ao Arch diam: 3.5 cm MITRAL VALVE MV Area (PHT): 4.31 cm     SHUNTS MV Decel Time: 176 msec     Systemic VTI:  0.15 m MV E velocity: 106.00 cm/s  Systemic Diam: 2.30 cm MV A velocity: 94.30 cm/s MV E/A ratio:  1.12 Kate Sable MD Electronically signed by Kate Sable MD Signature Date/Time: 05/17/2020/3:58:47 PM    Final       Assessment and Plan: Patient Active Problem List   Diagnosis Date Noted  . Chronic pain syndrome 05/24/2020  . Pharmacologic therapy 05/24/2020  . Disorder of skeletal system 05/24/2020  . Problems influencing health status 05/24/2020  . DDD (degenerative disc disease), cervical 05/24/2020  . 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 Cervical Grade 1 Retrolisthesis of C4/C5 and C5/C6 05/24/2020  . Chronic hand pain (Bilateral) 05/24/2020  . Chronic leg and foot pain (Bilateral) 05/24/2020  . Chronic lower extremity numbness from knee down (Bilateral) 05/24/2020  . Chronic greater occipital neuralgia (Bilateral) 05/24/2020  . Cervical facet syndrome (Left) 05/24/2020  . Cervical disc disease with myelopathy 04/26/2020  . Cervicalgia 04/09/2020  . Syncope and collapse 04/09/2020  . Esophageal dysphagia   . Gastroesophageal reflux disease with esophagitis without hemorrhage   . PAD (  peripheral artery disease) ([place]) 09/27/2019  . Diabetes ([place]) 09/14/2019  . CKD (chronic kidney disease) stage 3, GFR 30-59 ml/min (HCC) 09/14/2019  . Swelling of limb 09/14/2019  . Lymphedema 09/14/2019  . Chronic obstructive pulmonary disease ([place]) 09/05/2019  . Shortness of breath 09/05/2019  . Essential hypertension 09/05/2019  . Coronary atherosclerosis  11/15/2016  . Heel ulcer due to DM ([place]) 12/09/2012  . Septic olecranon bursitis 10/14/2011  . Cellulitis 10/06/2011  . Depression with anxiety 02/22/2011  . Diabetic peripheral neuropathy associated with type 1 diabetes mellitus ([place]) 02/22/2011  . Migraines 02/22/2011  . Hyperlipidemia, mixed 02/22/2011  . Sleep apnea 11/22/2010  . 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 OSA: He has been having a lot of difficulty tolerating the CPAP at a pressure of 9.  He may need to go on to BiPAP or AutoPap however prior to doing that I recommended that we get an echocardiogram because of his history of heart failure.  Once this is done then will make a decision regarding which direction to go 2. CHF patient has history of heart failure with an echo in the past showing preserved ejection fraction I will order a follow-up echocardiogram to reassess the ejection fraction. 3. CKD stage 3-Patient sees Dr Candiss Norse for his renal issues   General Counseling: I have discussed the findings of the evaluation and examination with Nehemiah Massed.  I have also discussed any further diagnostic evaluation thatmay be needed or ordered today. Orren verbalizes understanding of the findings of todays visit. We also reviewed his medications today and discussed drug interactions and side effects including but not limited excessive drowsiness and altered mental states. We also discussed that there is always a risk not just to him but also people around him. he has been encouraged to call the office with any questions or concerns that should arise related to todays visit.   Time spent: 20  I have personally obtained a history, examined the patient, evaluated laboratory and imaging results, formulated the assessment and plan and placed orders. This patient was seen by Casey Burkitt AGNP-C in Collaboration with Dr. Devona Konig as a part of collaborative care agreement.     Allyne Gee, MD [facility] Pulmonary and Critical Care Sleep medicine

## 2020-05-17 ENCOUNTER — Ambulatory Visit (INDEPENDENT_AMBULATORY_CARE_PROVIDER_SITE_OTHER): Payer: Managed Care, Other (non HMO)

## 2020-05-17 ENCOUNTER — Telehealth: Payer: Self-pay

## 2020-05-17 ENCOUNTER — Other Ambulatory Visit: Payer: Self-pay | Admitting: Nurse Practitioner

## 2020-05-17 DIAGNOSIS — I509 Heart failure, unspecified: Secondary | ICD-10-CM

## 2020-05-17 DIAGNOSIS — M5412 Radiculopathy, cervical region: Secondary | ICD-10-CM

## 2020-05-17 DIAGNOSIS — M542 Cervicalgia: Secondary | ICD-10-CM

## 2020-05-17 LAB — ECHOCARDIOGRAM COMPLETE
AR max vel: 1.98 cm2
AV Area VTI: 1.86 cm2
AV Area mean vel: 1.87 cm2
AV Mean grad: 7 mmHg
AV Peak grad: 11.8 mmHg
Ao pk vel: 1.72 m/s
Area-P 1/2: 4.31 cm2
Calc EF: 32.5 %
S' Lateral: 4.4 cm
Single Plane A2C EF: 32.6 %
Single Plane A4C EF: 30.3 %

## 2020-05-17 NOTE — Telephone Encounter (Signed)
Completed medical record request and mailed requesting records to [id number] [place] [address].

## 2020-05-23 NOTE — Progress Notes (Signed)
Patient: [name] [name]  Service Category: E/M  Provider: Gaspar Cola, MD  DOB: December 18, 1960  DOS: 05/24/2020  Referring Provider: Ronnell Freshwater, NP  MRN: [id number]  Setting: Ambulatory outpatient  PCP: [name], MD  Type: New Patient  Specialty: Interventional Pain Management    Location: Office  Delivery: Face-to-face     Primary Reason(s) for Visit: Encounter for initial evaluation of one or more chronic problems (new to examiner) potentially causing chronic pain, and posing a threat to normal musculoskeletal function. (Level of risk: High) CC: Neck Pain and Peripheral Neuropathy  HPI  [name] [name] is a 59 y.o. year old, male patient, who comes for the first time to our practice referred by Ronnell Freshwater, NP for our initial evaluation of his chronic pain. He has Chronic obstructive pulmonary disease ([place]); Shortness of breath; Essential hypertension; Diabetes ([place]); CKD (chronic kidney disease) stage 3, GFR 30-59 ml/min ([place]); Swelling of limb; Lymphedema; Cellulitis; Coronary artery disease with history of myocardial infarction without history of CABG; Coronary atherosclerosis; Depression with anxiety; Diabetic peripheral neuropathy associated with type 1 diabetes mellitus ([place]); Heel ulcer due to DM ([place]); Migraines; Hyperlipidemia, mixed; Septic olecranon bursitis; Sleep apnea; PAD (peripheral artery disease) ([place]); Esophageal dysphagia; Gastroesophageal reflux disease with esophagitis without hemorrhage; Cervicalgia; Syncope and collapse; Cervical disc disease with myelopathy; Chronic pain syndrome; Pharmacologic therapy; Disorder of skeletal system; Problems influencing health status; DDD (degenerative disc disease), cervical; 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 Today he comes in for evaluation of his Neck Pain and Peripheral Neuropathy  Pain Assessment: Location:   Neck Radiating: left shoulder Onset: 1 to 4 weeks ago Duration: Chronic pain Quality: Crushing,Sharp,Shooting,Stabbing Severity: 0-No pain (if not moving, can go to10 quickly upon moving)/10 (subjective, self-reported pain score)  Effect on ADL:   Timing: Intermittent Modifying factors: heat, ice, medication, topicals, positioning BP: (!) 170/100  HR: 77  Onset and Duration: Gradual and Present longer than 3 months Cause of pain: Unknown Severity: NAS-11 at its worse: 10/10, NAS-11 at its best: 4/10, NAS-11 now: 4/10 and NAS-11 on the average: 5/10 Timing: Not influenced by the time of the day Aggravating Factors: Motion Alleviating Factors: Cold packs, Hot packs, Medications and Resting Associated Problems: Pain that wakes patient up and Pain that does not allow patient to sleep Quality of Pain: Intermittent, Sharp, Shooting and Stabbing Previous Examinations or Tests: X-rays Previous Treatments: Narcotic medications  According to the patient the primary area of pain is that of his feet and hands with the feet being worse than the hands.  He describes the pain to be bilateral and he also describes having no feeling from his knees down.  He indicates that this is secondary to a diabetic peripheral neuropathy.  The patient's next area of pain is that of the neck on the left side.  He describes the pain to have been there for the past 4 weeks.  She describes this pain to also be saw then.  He denies any shoulder pain or upper extremity pain except for what he calls the peripheral neuropathy.  He also indicates not having any specific fingers were he has more pain or numbness than others.  He describes all of them to be equally affected.  In addition to this  the patient does admit to having headaches that  usually involve the very top of his head and the typical distribution of the greater occipital nerve, bilaterally.  However, he denies any pain in the back of the head.  The patient has a history of having passed out and fallen but he describes that this pain that he has in the neck he had a before that fall.  He was unable to explain to me why he passed out and he refers that he has not been told as to why that happened.  Interestingly, the patient describes that he has had this pains for more than 10 years except for the one in the neck.  He describes taking Flexeril and hydrocodone as needed.  All other medical conditions include insulin-dependent diabetes mellitus, coronary artery disease, peripheral vascular disease, chronic kidney disease, and COPD.  Physical exam demonstrated an individual that was brought into the room on a wheelchair with evidence of bilateral hand muscle atrophy.  Range of motion of the cervical spine was relatively within normal limits except that rotating the head towards the right side reproduce the pain on the left side of the neck.  Rotating towards the left also reproduces the pain in the left side of the neck.  Hyperextension, rotation, and lateral bending maneuver did reproduce the patient's pain on the left side but not on the right.  Cervical spine compression test was negative.  Prior lab work on 03/13/2020 demonstrated the patient to be having electrolyte problems with a hypokalemia of 2.6, which could have contributed to the patient "passing out".  The patient is scheduled to undergo a cervical MRI tomorrow.  I believe this would be very helpful in getting some light as to what may be causing this problem.  Today I took the time to provide the patient with information regarding my pain practice. The patient was informed that my practice is divided into two sections: an interventional pain management section, as well as a completely separate and distinct medication  management section. I explained that I have procedure days for my interventional therapies, and evaluation days for follow-ups and medication management. Because of the amount of documentation required during both, they are kept separated. This means that there is the possibility that he may be scheduled for a procedure on one day, and medication management the next. I have also informed him that because of staffing and facility limitations, I no longer take patients for medication management only. To illustrate the reasons for this, I gave the patient the example of surgeons, and how inappropriate it would be to refer a patient to his/her care, just to write for the post-surgical antibiotics on a surgery done by a different surgeon.   Because interventional pain management is my board-certified specialty, the patient was informed that joining my practice means that they are open to any and all interventional therapies. I made it clear that this does not mean that they will be forced to have any procedures done. What this means is that I believe interventional therapies to be essential part of the diagnosis and proper management of chronic pain conditions. Therefore, patients not interested in these interventional alternatives will be better served under the care of a different practitioner.  The patient was also made aware of my Comprehensive Pain Management Safety Guidelines where by joining my practice, they limit all of their nerve blocks and joint injections to those done by our practice, for as long as we are retained to  manage their care.   Historic Controlled Substance Pharmacotherapy Review  PMP and historical list of controlled substances: "No results available" according to PMP. Current opioid analgesics:  None MME/day: 0 mg/day  Historical Monitoring: The patient  reports no history of drug use. List of all UDS Test(s): No results found for: MDMA, COCAINSCRNUR, [place], [place],  CANNABQUANT, THCU, [place] List of other Serum/Urine Drug Screening Test(s):  No results found for: AMPHSCRSER, BARBSCRSER, BENZOSCRSER, COCAINSCRSER, COCAINSCRNUR, PCPSCRSER, PCPQUANT, THCSCRSER, THCU, CANNABQUANT, OPIATESCRSER, OXYSCRSER, PROPOXSCRSER, ETH Historical Background Evaluation: [place] PMP: PDMP reviewed during this encounter. Online review of the past 50-monthperiod conducted.             PMP NARX Score Report:  Narcotic: 000 Sedative: 000 Stimulant: 000 [place] Department of public safety, offender search: (Editor, commissioningInformation) Non-contributory Risk Assessment Profile: Aberrant behavior: None observed or detected today Risk factors for fatal opioid overdose: None identified today PMP NARX Overdose Risk Score: 000 Fatal overdose hazard ratio (HR): Calculation deferred Non-fatal overdose hazard ratio (HR): Calculation deferred Risk of opioid abuse or dependence: 0.7-3.0% with doses ? 36 MME/day and 6.1-26% with doses ? 120 MME/day. Substance use disorder (SUD) risk level: See below Personal History of Substance Abuse (SUD-Substance use disorder):  Alcohol: Negative  Illegal Drugs: Negative  Rx Drugs: Negative  ORT Risk Level calculation: Low Risk  Opioid Risk Tool - 05/24/20 1115      Family History of Substance Abuse   Alcohol Negative    Illegal Drugs Negative    Rx Drugs Negative      Personal History of Substance Abuse   Alcohol Negative    Illegal Drugs Negative    Rx Drugs Negative      Age   Age between 16-45years  No      History of Preadolescent Sexual Abuse   History of Preadolescent Sexual Abuse Negative or Male      Psychological Disease   Psychological Disease Negative      Total Score   Opioid Risk Tool Scoring 0    Opioid Risk Interpretation Low Risk          ORT Scoring interpretation table:  Score <3 = Low Risk for SUD  Score between 4-7 = Moderate Risk for SUD  Score >8 = High Risk for Opioid Abuse   PHQ-2 Depression Scale:  Total score:  0  PHQ-2 Scoring interpretation table: (Score and probability of major depressive disorder)  Score 0 = No depression  Score 1 = 15.4% Probability  Score 2 = 21.1% Probability  Score 3 = 38.4% Probability  Score 4 = 45.5% Probability  Score 5 = 56.4% Probability  Score 6 = 78.6% Probability   PHQ-9 Depression Scale:  Total score: 0  PHQ-9 Scoring interpretation table:  Score 0-4 = No depression  Score 5-9 = Mild depression  Score 10-14 = Moderate depression  Score 15-19 = Moderately severe depression  Score 20-27 = Severe depression (2.4 times higher risk of SUD and 2.89 times higher risk of overuse)   Pharmacologic Plan: As per protocol, I have not taken over any controlled substance management, pending the results of ordered tests and/or consults.            Initial impression: Pending review of available data and ordered tests.  Meds   Current Outpatient Medications:  .  albuterol (ACCUNEB) 0.63 MG/3ML nebulizer solution, Inhale into the lungs., Disp: , Rfl:  .  Alirocumab (PRALUENT) 75 MG/ML SOAJ, Inject 75 mg into the skin  every 14 (fourteen) days., Disp: 2 pen, Rfl: 11 .  aspirin EC 81 MG tablet, Take 81 mg by mouth daily. Swallow whole., Disp: , Rfl:  .  carvedilol (COREG) 12.5 MG tablet, Take 12.5 mg by mouth 2 (two) times daily with a meal., Disp: , Rfl:  .  cholecalciferol (VITAMIN D3) 25 MCG (1000 UNIT) tablet, Take 2,000 Units by mouth daily., Disp: , Rfl:  .  diclofenac Sodium (VOLTAREN) 1 % GEL, Apply 2 g topically in the morning and at bedtime. To the neck muscle, Disp: 150 g, Rfl: 1 .  ezetimibe (ZETIA) 10 MG tablet, TAKE 1 TABLET BY MOUTH EVERY DAY, Disp: 90 tablet, Rfl: 1 .  fluticasone furoate-vilanterol (BREO ELLIPTA) 100-25 MCG/INH AEPB, Inhale 1 puff into the lungs daily., Disp: , Rfl:  .  HYDROcodone-acetaminophen (NORCO) 7.5-325 MG tablet, Take 1 tablet by mouth as needed for moderate pain., Disp: , Rfl:  .  insulin degludec (TRESIBA FLEXTOUCH) 200 UNIT/ML  FlexTouch Pen, Inject 140 Units into the skin daily., Disp: 21 mL, Rfl: 5 .  insulin lispro (HUMALOG) 100 UNIT/ML KwikPen, Inject into the skin. 0-20 units per sliding scale, Disp: , Rfl:  .  Insulin Pen Needle (B-D ULTRAFINE III SHORT PEN) 31G X 8 MM MISC, To use with pen basal and mealtime coverage insulin pens 5 times daily. DX: E11.65, Disp: 500 each, Rfl: 1 .  metoCLOPramide (REGLAN) 5 MG tablet, One tab before each meal and one at bed time for dyspepsia prn, Disp: 120 tablet, Rfl: 2 .  metolazone (ZAROXOLYN) 5 MG tablet, Take 5 mg by mouth 2 (two) times a week. As needed for leg swelling, Disp: , Rfl:  .  pantoprazole (PROTONIX) 40 MG tablet, Take 1 tablet (40 mg total) by mouth 2 (two) times daily before a meal., Disp: 180 tablet, Rfl: 1 .  torsemide (DEMADEX) 20 MG tablet, Take 2 tablets (40 mg) by mouth daily in the morning, Disp: , Rfl:   Imaging Review  Cervical Imaging: Cervical DG complete: Results for orders placed during the hospital encounter of 03/27/20 DG Cervical Spine Complete  Narrative CLINICAL DATA:  Cervical spine crepitus  EXAM: CERVICAL SPINE - COMPLETE 4+ VIEW  COMPARISON:  None  FINDINGS: There is straightening of the cervical spine. No acute fracture. Minimal retrolisthesis of C4 upon C5 and C5 upon C6 is likely degenerative in nature. Vertebral body height has been preserved. There is severe intervertebral disc space narrowing and endplate remodeling at B7-6 and C5-6 in keeping with changes of severe degenerative disc disease. The spinal canal is widely patent. The prevertebral soft tissues are unremarkable. Oblique views fail to profile the right neural foramina. The left neural foramen are not optimally profiled, however, there is at least mild left neural foraminal narrowing at C4-5 and C5-6 secondary to uncovertebral arthrosis. Lateral masses of C1 are well aligned with the body of C2.  IMPRESSION: Advanced degenerative disc disease C4-5 and C5-6  with at least mild left neural foraminal narrowing at this level.   Electronically Signed By: Fidela Salisbury MD On: 03/27/2020 23:56  Complexity Note: Imaging results reviewed. Results shared with [name] [name], using Layman's terms.                        ROS  Cardiovascular: Heart trouble, High blood pressure and Heart surgery Pulmonary or Respiratory: Lung problems and Temporary stoppage of breathing during sleep Neurological: Abnormal skin sensations (Peripheral Neuropathy) Psychological-Psychiatric: Depressed Gastrointestinal: Vomiting blood (Ulcers)  and Reflux or heatburn Genitourinary: Kidney disease Hematological: No reported hematological signs or symptoms such as prolonged bleeding, low or poor functioning platelets, bruising or bleeding easily, hereditary bleeding problems, low energy levels due to low hemoglobin or being anemic Endocrine: High blood sugar requiring insulin (IDDM) Rheumatologic: Joint aches and or swelling due to excess weight (Osteoarthritis) Musculoskeletal: Negative for myasthenia gravis, muscular dystrophy, multiple sclerosis or malignant hyperthermia Work History: Retired  Allergies  [name] [name] is allergic to other, statins, atorvastatin, entresto [sacubitril-valsartan], pravastatin, pregabalin, and rosuvastatin.  Laboratory Chemistry Profile   Renal Lab Results  Component Value Date   BUN 64 (H) 03/13/2020   CREATININE 2.75 (H) 03/13/2020   BCR 21 12/02/2019   GFRAA 27 (L) 12/02/2019   GFRNONAA 24 (L) 03/13/2020     Electrolytes Lab Results  Component Value Date   NA 132 (L) 03/13/2020   K 2.6 (LL) 03/13/2020   CL 90 (L) 03/13/2020   CALCIUM 8.3 (L) 03/13/2020     Hepatic Lab Results  Component Value Date   AST 22 12/02/2019   ALT 28 12/02/2019   ALBUMIN 3.7 (L) 12/02/2019   ALKPHOS 131 (H) 12/02/2019     ID Lab Results  Component Value Date   SARSCOV2NAA NEGATIVE 03/31/2020     Bone No results found for: VD25OH, [id number],  WL7989QJ1, HE1740CX4, 25OHVITD1, 25OHVITD2, 25OHVITD3, TESTOFREE, TESTOSTERONE   Endocrine Lab Results  Component Value Date   GLUCOSE 374 (H) 03/13/2020   HGBA1C 14.0 (A) 02/18/2020   TSH 1.730 12/02/2019   FREET4 1.30 12/02/2019     Neuropathy Lab Results  Component Value Date   HGBA1C 14.0 (A) 02/18/2020     CNS No results found for: COLORCSF, APPEARCSF, RBCCOUNTCSF, WBCCSF, POLYSCSF, LYMPHSCSF, EOSCSF, PROTEINCSF, GLUCCSF, JCVIRUS, CSFOLI, IGGCSF, LABACHR, ACETBL, LABACHR, ACETBL   Inflammation (CRP: Acute  ESR: Chronic) No results found for: CRP, ESRSEDRATE, LATICACIDVEN   Rheumatology No results found for: RF, ANA, LABURIC, URICUR, LYMEIGGIGMAB, LYMEABIGMQN, HLAB27   Coagulation Lab Results  Component Value Date   PLT 334 03/13/2020     Cardiovascular Lab Results  Component Value Date   BNP 373.0 (H) 08/02/2019   CKTOTAL 206 12/29/2012   HGB 14.9 03/13/2020   HCT 41.4 03/13/2020     Screening Lab Results  Component Value Date   SARSCOV2NAA NEGATIVE 03/31/2020     Cancer No results found for: CEA, CA125, LABCA2   Allergens No results found for: ALMOND, APPLE, ASPARAGUS, AVOCADO, BANANA, BARLEY, BASIL, BAYLEAF, GREENBEAN, LIMABEAN, WHITEBEAN, BEEFIGE, REDBEET, BLUEBERRY, BROCCOLI, CABBAGE, MELON, CARROT, CASEIN, CASHEWNUT, CAULIFLOWER, CELERY     Note: Lab results reviewed.  PFSH  Drug: [name] [name]  reports no history of drug use. Alcohol:  reports current alcohol use. Tobacco:  reports that he quit smoking about 8 years ago. His smoking use included cigarettes. He has never used smokeless tobacco. Medical:  has a past medical history of Arrhythmia, Arthritis, CAD (coronary artery disease), Colon polyps, Diabetes ([place]), Dupre's syndrome, GERD (gastroesophageal reflux disease), HFimpEF (heart failure with improved ejection fraction) ([place]), Hyperlipidemia, Hypertension, Ischemic cardiomyopathy, Lymphedema, Migraine, Neuropathy, PAD (peripheral artery disease)  ([place]), Peripheral vascular disease ([place]), Retinopathy, Sleep apnea, Stage 3 chronic kidney disease ([place]), and Stomach ulcer. Family: family history includes Diabetes in his mother; Heart disease in his father.  Past Surgical History:  Procedure Laterality Date  . bone graft surgery    . CARDIAC CATHETERIZATION  2013   S/p PCI   . CARDIAC CATHETERIZATION  2018   S/p CABG  .  CARDIAC SURGERY    . CORONARY ARTERY BYPASS GRAFT  2018   (LIMA-LAD,VG-RCA,VG-OM1,VG-D1)  . ESOPHAGOGASTRODUODENOSCOPY (EGD) WITH PROPOFOL N/A 04/04/2020   Procedure: ESOPHAGOGASTRODUODENOSCOPY (EGD) WITH PROPOFOL;  Surgeon: Lucilla Lame, MD;  [facility] ENDOSCOPY;  Service: Endoscopy;  Laterality: N/A;  . EYE SURGERY    . PERIPHERAL VASCULAR CATHETERIZATION Right 10/28/2016   PTA/DEB Right SFA  . WRIST SURGERY     Active Ambulatory Problems    Diagnosis Date Noted  . Chronic obstructive pulmonary disease ([place]) 09/05/2019  . Shortness of breath 09/05/2019  . Essential hypertension 09/05/2019  . Diabetes ([place]) 09/14/2019  . CKD (chronic kidney disease) stage 3, GFR 30-59 ml/min ([facility]) 09/14/2019  . Swelling of limb 09/14/2019  . Lymphedema 09/14/2019  . Cellulitis 10/06/2011  . Coronary artery disease with history of myocardial infarction without history of CABG 10/21/2009  . Coronary atherosclerosis 11/15/2016  . Depression with anxiety 02/22/2011  . Diabetic peripheral neuropathy associated with type 1 diabetes mellitus ([place]) 02/22/2011  . Heel ulcer due to DM ([place]) 12/09/2012  . Migraines 02/22/2011  . Hyperlipidemia, mixed 02/22/2011  . Septic olecranon bursitis 10/14/2011  . Sleep apnea 11/22/2010  . PAD (peripheral artery disease) ([place]) 09/27/2019  . Esophageal dysphagia   . Gastroesophageal reflux disease with esophagitis without hemorrhage   . Cervicalgia 04/09/2020  . Syncope and collapse 04/09/2020  . Cervical disc disease with myelopathy 04/26/2020  . Chronic pain syndrome 05/24/2020  .  Pharmacologic therapy 05/24/2020  . Disorder of skeletal system 05/24/2020  . Problems influencing health status 05/24/2020  . DDD (degenerative disc disease), cervical 05/24/2020  . 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 Cervical Grade 1 Retrolisthesis of C4/C5 and C5/C6 05/24/2020  . Chronic hand pain (Bilateral) 05/24/2020  . Chronic leg and foot pain (Bilateral) 05/24/2020  . Chronic lower extremity numbness from knee down (Bilateral) 05/24/2020  . Chronic greater occipital neuralgia (Bilateral) 05/24/2020  . Cervical facet syndrome (Left) 05/24/2020   Resolved Ambulatory Problems    Diagnosis Date Noted  . No Resolved Ambulatory Problems   Past Medical History:  Diagnosis Date  . Arrhythmia   . Arthritis   . CAD (coronary artery disease)   . Colon polyps   . Dupre's syndrome   . GERD (gastroesophageal reflux disease)   . HFimpEF (heart failure with improved ejection fraction) ([place])   . Hyperlipidemia   . Hypertension   . Ischemic cardiomyopathy   . Migraine   . Neuropathy   . Peripheral vascular disease ([place])   . Retinopathy   . Stage 3 chronic kidney disease ([place])   . Stomach ulcer    Constitutional Exam  General appearance: Well nourished, well developed, and well hydrated. In no apparent acute distress Vitals:   05/24/20 1103  BP: (!) 170/100  Pulse: 77  Resp: 18  Temp: (!) 97 F (36.1 C)  TempSrc: Temporal  SpO2: 98%  Weight: 205 lb (93 kg)  Height: 6' 2"  (1.88 m)   BMI Assessment: Estimated body mass index is 26.32 kg/m as calculated from the following:   Height as of this encounter: 6' 2"  (1.88 m).   Weight as of this encounter: 205 lb (93 kg).  BMI interpretation table: BMI level Category Range association with higher incidence of chronic pain  <18 kg/m2 Underweight   18.5-24.9 kg/m2 Ideal body weight   25-29.9 kg/m2 Overweight Increased incidence by 20%  30-34.9 kg/m2 Obese (Class I) Increased incidence by 68%  35-39.9  kg/m2 Severe obesity (Class II)  Increased incidence by 136%  >40 kg/m2 Extreme obesity (Class III) Increased incidence by 254%   Patient's current BMI Ideal Body weight  Body mass index is 26.32 kg/m. Ideal body weight: 82.2 kg (181 lb 3.5 oz) Adjusted ideal body weight: 86.5 kg (190 lb 11.7 oz)   BMI Readings from Last 4 Encounters:  05/24/20 26.32 kg/m  05/16/20 25.99 kg/m  05/02/20 27.39 kg/m  04/25/20 26.78 kg/m   Wt Readings from Last 4 Encounters:  05/24/20 205 lb (93 kg)  05/16/20 197 lb (89.4 kg)  05/02/20 207 lb 9.6 oz (94.2 kg)  04/25/20 203 lb (92.1 kg)    Psych/Mental status: Alert, oriented x 3 (person, place, & time)       Eyes: PERLA Respiratory: No evidence of acute respiratory distress  Assessment  Primary Diagnosis & Pertinent Problem List: The primary encounter diagnosis was Chronic pain syndrome. 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  Visit Diagnosis (New problems to examiner): 1. Chronic pain syndrome   2. Cervicalgia   3. DDD (degenerative disc disease), cervical   4. Cervical facet syndrome (Left)   5. 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 Cervical Grade 1 Retrolisthesis of C4/C5 and C5/C6   7. Chronic hand pain (Bilateral)   8. Chronic greater occipital neuralgia (Bilateral)   9. Chronic lower extremity numbness from knee down (Bilateral)   10. Chronic leg and foot pain (Bilateral)   11. Diabetic  peripheral neuropathy associated with type 1 diabetes mellitus ([place])   12. Pharmacologic therapy   13. Disorder of skeletal system   14. Problems influencing health status    Plan of Care (Initial workup plan)  Note: [name] [name] was reminded that as per protocol, today's visit has been an evaluation only. We have not taken over the patient's controlled substance management.  Problem-specific plan: No problem-specific Assessment & Plan notes found for this encounter.   Lab Orders     Compliance Drug Analysis, Ur     Comp. Metabolic Panel (12)     Magnesium     Vitamin B12     Sedimentation rate     25-Hydroxy vitamin D Lcms D2+D3     C-reactive protein Imaging Orders  No imaging studies ordered today   Referral Orders  No referral(s) requested today    Procedure Orders     Cervical Epidural Injection Pharmacotherapy (current): Medications ordered:  No orders of the defined types were placed in this encounter.  Medications administered during this visit: [name] Bellow "John" had no medications administered during this visit.   Pharmacological management options:  Opioid Analgesics: The patient was informed that there is no guarantee that he would be a candidate for opioid analgesics. The decision will be made following CDC guidelines. This decision will be based on the results of diagnostic studies, as well as [name] [name]'s risk profile.   Membrane stabilizer: To be determined at a later time  Muscle relaxant: To be determined at a later time  NSAID: To be determined at a later time  Other analgesic(s): To be determined at a later time  Interventional management options: [name] [name] was informed that there is no guarantee that he would be a candidate for interventional therapies. The decision will be based on the results of diagnostic studies, as well as [name] [name]'s risk profile.  Procedure(s) under consideration:  Diagnostic left C7-T1 cervical ESI #1  Diagnostic left  cervical facet block #1    Provider-requested follow-up: Return for Procedure (w/ sedation): (L) CESI #1, (s/p Tests).  Future Appointments  Date Time Provider Finger  05/25/2020 11:00 AM [facility]  05/31/2020 10:30 AM Ralene Bathe, MD [facility] None  07/18/2020  9:15 AM Luiz Ochoa, NP [facility] None  10/24/2020 11:20 AM Minna Merritts, MD [facility]  12/07/2020  9:30 AM Luiz Ochoa, NP [facility] None  04/10/2021 10:30 AM [facility] 1 [facility] None  04/10/2021 11:45 AM Dew, Erskine Squibb, MD [facility] None    Note by: Gaspar Cola, MD Date: 05/24/2020; Time: 12:28 PM

## 2020-05-24 ENCOUNTER — Encounter: Payer: Self-pay | Admitting: Pain Medicine

## 2020-05-24 ENCOUNTER — Other Ambulatory Visit: Payer: Self-pay

## 2020-05-24 ENCOUNTER — Ambulatory Visit: Payer: Managed Care, Other (non HMO) | Attending: Pain Medicine | Admitting: Pain Medicine

## 2020-05-24 VITALS — BP 170/100 | HR 77 | Temp 97.0°F | Resp 18 | Ht 74.0 in | Wt 205.0 lb

## 2020-05-24 DIAGNOSIS — M79671 Pain in right foot: Secondary | ICD-10-CM | POA: Diagnosis present

## 2020-05-24 DIAGNOSIS — Z789 Other specified health status: Secondary | ICD-10-CM | POA: Diagnosis present

## 2020-05-24 DIAGNOSIS — Z79899 Other long term (current) drug therapy: Secondary | ICD-10-CM | POA: Diagnosis present

## 2020-05-24 DIAGNOSIS — G894 Chronic pain syndrome: Secondary | ICD-10-CM | POA: Diagnosis present

## 2020-05-24 DIAGNOSIS — M79642 Pain in left hand: Secondary | ICD-10-CM | POA: Insufficient documentation

## 2020-05-24 DIAGNOSIS — R200 Anesthesia of skin: Secondary | ICD-10-CM | POA: Diagnosis present

## 2020-05-24 DIAGNOSIS — M79672 Pain in left foot: Secondary | ICD-10-CM | POA: Insufficient documentation

## 2020-05-24 DIAGNOSIS — M79605 Pain in left leg: Secondary | ICD-10-CM | POA: Insufficient documentation

## 2020-05-24 DIAGNOSIS — M5030 Other cervical disc degeneration, unspecified cervical region: Secondary | ICD-10-CM | POA: Insufficient documentation

## 2020-05-24 DIAGNOSIS — M542 Cervicalgia: Secondary | ICD-10-CM | POA: Insufficient documentation

## 2020-05-24 DIAGNOSIS — M899 Disorder of bone, unspecified: Secondary | ICD-10-CM | POA: Insufficient documentation

## 2020-05-24 DIAGNOSIS — E1042 Type 1 diabetes mellitus with diabetic polyneuropathy: Secondary | ICD-10-CM | POA: Diagnosis present

## 2020-05-24 DIAGNOSIS — M4310 Spondylolisthesis, site unspecified: Secondary | ICD-10-CM | POA: Insufficient documentation

## 2020-05-24 DIAGNOSIS — M79604 Pain in right leg: Secondary | ICD-10-CM | POA: Diagnosis present

## 2020-05-24 DIAGNOSIS — M4802 Spinal stenosis, cervical region: Secondary | ICD-10-CM | POA: Diagnosis present

## 2020-05-24 DIAGNOSIS — M5481 Occipital neuralgia: Secondary | ICD-10-CM | POA: Diagnosis present

## 2020-05-24 DIAGNOSIS — M79641 Pain in right hand: Secondary | ICD-10-CM | POA: Diagnosis present

## 2020-05-24 DIAGNOSIS — M47812 Spondylosis without myelopathy or radiculopathy, cervical region: Secondary | ICD-10-CM | POA: Insufficient documentation

## 2020-05-24 NOTE — Progress Notes (Signed)
Safety precautions to be maintained throughout the outpatient stay will include: orient to surroundings, keep bed in low position, maintain call bell within reach at all times, provide assistance with transfer out of bed and ambulation.  

## 2020-05-24 NOTE — Patient Instructions (Addendum)
____________________________________________________________________________________________  Preparing for Procedure with Sedation  Procedure appointments are limited to planned procedures:  No Prescription Refills.  No disability issues will be discussed.  No medication changes will be discussed.  Instructions:  Oral Intake: Do not eat or drink anything for at least 8 hours prior to your procedure. (Exception: Blood Pressure Medication. See below.)  Transportation: Unless otherwise stated by your physician, you may drive yourself after the procedure.  Blood Pressure Medicine: Do not forget to take your blood pressure medicine with a sip of water the morning of the procedure. If your Diastolic (lower reading)is above 100 mmHg, elective cases will be cancelled/rescheduled.  Blood thinners: These will need to be stopped for procedures. Notify our staff if you are taking any blood thinners. Depending on which one you take, there will be specific instructions on how and when to stop it.  Diabetics on insulin: Notify the staff so that you can be scheduled 1st case in the morning. If your diabetes requires high dose insulin, take only  of your normal insulin dose the morning of the procedure and notify the staff that you have done so.  Preventing infections: Shower with an antibacterial soap the morning of your procedure.  Build-up your immune system: Take 1000 mg of Vitamin C with every meal (3 times a day) the day prior to your procedure.  Antibiotics: Inform the staff if you have a condition or reason that requires you to take antibiotics before dental procedures.  Pregnancy: If you are pregnant, call and cancel the procedure.  Sickness: If you have a cold, fever, or any active infections, call and cancel the procedure.  Arrival: You must be in the facility at least 30 minutes prior to your scheduled procedure.  Children: Do not bring children with you.  Dress appropriately:  Bring dark clothing that you would not mind if they get stained.  Valuables: Do not bring any jewelry or valuables.  Reasons to call and reschedule or cancel your procedure: (Following these recommendations will minimize the risk of a serious complication.)  Surgeries: Avoid having procedures within 2 weeks of any surgery. (Avoid for 2 weeks before or after any surgery).  Flu Shots: Avoid having procedures within 2 weeks of a flu shots or . (Avoid for 2 weeks before or after immunizations).  Barium: Avoid having a procedure within 7-10 days after having had a radiological study involving the use of radiological contrast. (Myelograms, Barium swallow or enema study).  Heart attacks: Avoid any elective procedures or surgeries for the initial 6 months after a "Myocardial Infarction" (Heart Attack).  Blood thinners: It is imperative that you stop these medications before procedures. Let us know if you if you take any blood thinner.   Infection: Avoid procedures during or within two weeks of an infection (including chest colds or gastrointestinal problems). Symptoms associated with infections include: Localized redness, fever, chills, night sweats or profuse sweating, burning sensation when voiding, cough, congestion, stuffiness, runny nose, sore throat, diarrhea, nausea, vomiting, cold or Flu symptoms, recent or current infections. It is specially important if the infection is over the area that we intend to treat.  Heart and lung problems: Symptoms that may suggest an active cardiopulmonary problem include: cough, chest pain, breathing difficulties or shortness of breath, dizziness, ankle swelling, uncontrolled high or unusually low blood pressure, and/or palpitations. If you are experiencing any of these symptoms, cancel your procedure and contact your primary care physician for an evaluation.  Remember:  Regular Business hours are:  Monday to Thursday 8:00 AM to 4:00 PM  Provider's  Schedule: Milinda Pointer, MD:  Procedure days: Tuesday and Thursday 7:30 AM to 4:00 PM  Gillis Santa, MD:  Procedure days: Monday and Wednesday 7:30 AM to 4:00 PM ____________________________________________________________________________________________   ____________________________________________________________________________________________  General Risks and Possible Complications  Patient Responsibilities: It is important that you read this as it is part of your informed consent. It is our duty to inform you of the risks and possible complications associated with treatments offered to you. It is your responsibility as a patient to read this and to ask questions about anything that is not clear or that you believe was not covered in this document.  Patients Rights: You have the right to refuse treatment. You also have the right to change your mind, even after initially having agreed to have the treatment done. However, under this last option, if you wait until the last second to change your mind, you may be charged for the materials used up to that point.  Introduction: Medicine is not an Chief Strategy Officer. Everything in Medicine, including the lack of treatment(s), carries the potential for danger, harm, or loss (which is by definition: Risk). In Medicine, a complication is a secondary problem, condition, or disease that can aggravate an already existing one. All treatments carry the risk of possible complications. The fact that a side effects or complications occurs, does not imply that the treatment was conducted incorrectly. It must be clearly understood that these can happen even when everything is done following the highest safety standards.  No treatment: You can choose not to proceed with the proposed treatment alternative. The PRO(s) would include: avoiding the risk of complications associated with the therapy. The CON(s) would include: not getting any of the treatment  benefits. These benefits fall under one of three categories: diagnostic; therapeutic; and/or palliative. Diagnostic benefits include: getting information which can ultimately lead to improvement of the disease or symptom(s). Therapeutic benefits are those associated with the successful treatment of the disease. Finally, palliative benefits are those related to the decrease of the primary symptoms, without necessarily curing the condition (example: decreasing the pain from a flare-up of a chronic condition, such as incurable terminal cancer).  General Risks and Complications: These are associated to most interventional treatments. They can occur alone, or in combination. They fall under one of the following six (6) categories: no benefit or worsening of symptoms; bleeding; infection; nerve damage; allergic reactions; and/or death. 1. No benefits or worsening of symptoms: In Medicine there are no guarantees, only probabilities. No healthcare provider can ever guarantee that a medical treatment will work, they can only state the probability that it may. Furthermore, there is always the possibility that the condition may worsen, either directly, or indirectly, as a consequence of the treatment. 2. Bleeding: This is more common if the patient is taking a blood thinner, either prescription or over the counter (example: Goody Powders, Fish oil, Aspirin, Garlic, etc.), or if suffering a condition associated with impaired coagulation (example: Hemophilia, cirrhosis of the liver, low platelet counts, etc.). However, even if you do not have one on these, it can still happen. If you have any of these conditions, or take one of these drugs, make sure to notify your treating physician. 3. Infection: This is more common in patients with a compromised immune system, either due to disease (example: diabetes, cancer, human immunodeficiency virus [HIV], etc.), or due to medications or treatments (example: therapies used to treat  cancer and  rheumatological diseases). However, even if you do not have one on these, it can still happen. If you have any of these conditions, or take one of these drugs, make sure to notify your treating physician. 4. Nerve Damage: This is more common when the treatment is an invasive one, but it can also happen with the use of medications, such as those used in the treatment of cancer. The damage can occur to small secondary nerves, or to large primary ones, such as those in the spinal cord and brain. This damage may be temporary or permanent and it may lead to impairments that can range from temporary numbness to permanent paralysis and/or brain death. 5. Allergic Reactions: Any time a substance or material comes in contact with our body, there is the possibility of an allergic reaction. These can range from a mild skin rash (contact dermatitis) to a severe systemic reaction (anaphylactic reaction), which can result in death. 6. Death: In general, any medical intervention can result in death, most of the time due to an unforeseen complication. ____________________________________________________________________________________________   Epidural Steroid Injection  An epidural steroid injection is a shot of steroid medicine and numbing medicine that is given into the space between the spinal cord and the bones of the back (epidural space). The shot helps relieve pain caused by an irritated or swollen nerve root. The amount of pain relief you get from the injection depends on what is causing the nerve to be swollen and irritated, and how long your pain lasts. You are more likely to benefit from this injection if your pain is strong and comes on suddenly rather than if you have had long-term (chronic) pain. Tell a health care provider about:  Any allergies you have.  All medicines you are taking, including vitamins, herbs, eye drops, creams, and over-the-counter medicines.  Any problems you or  family members have had with anesthetic medicines.  Any blood disorders you have.  Any surgeries you have had.  Any medical conditions you have.  Whether you are pregnant or may be pregnant. What are the risks? Generally, this is a safe procedure. However, problems may occur, including:  Headache.  Bleeding.  Infection.  Allergic reaction to medicines.  Nerve damage. What happens before the procedure? Staying hydrated Follow instructions from your health care provider about hydration, which may include:  Up to 2 hours before the procedure - you may continue to drink clear liquids, such as water, clear fruit juice, black coffee, and plain tea. Eating and drinking restrictions Follow instructions from your health care provider about eating and drinking, which may include:  8 hours before the procedure - stop eating heavy meals or foods, such as meat, fried foods, or fatty foods.  6 hours before the procedure - stop eating light meals or foods, such as toast or cereal.  6 hours before the procedure - stop drinking milk or drinks that contain milk.  2 hours before the procedure - stop drinking clear liquids. Medicines  You may be given medicines to lower anxiety.  Ask your health care provider about: ? Changing or stopping your regular medicines. This is especially important if you are taking diabetes medicines or blood thinners. ? Taking medicines such as aspirin and ibuprofen. These medicines can thin your blood. Do not take these medicines unless your health care provider tells you to take them. ? Taking over-the-counter medicines, vitamins, herbs, and supplements.  Ask your health care provider what steps will be taken to prevent infection. General  instructions  Plan to have someone take you home from the hospital or clinic.  If you will be going home right after the procedure, plan to have someone with you for 24 hours. What happens during the procedure?  An IV  will be inserted into one of your veins.  You will be given one or more of the following: ? A medicine to help you relax (sedative). ? A medicine to numb the area (local anesthetic).  You will be asked to lie on your abdomen or sit.  The injection site will be cleaned.  A needle will be inserted through your skin into the epidural space. This may cause you some discomfort. An X-ray machine will be used to guide the needle as close as possible to the affected nerve.  A steroid medicine and a local anesthetic will be injected into the epidural space.  The needle and IV will be removed.  A bandage (dressing) will be put over the injection site. The procedure may vary among health care providers and hospitals. What can I expect after the procedure? Follow these instructions at home: Injection site care  You may remove the bandage (dressing) after 24 hours.  Check your injection site every day for signs of infection. Check for: ? Redness, swelling, or pain. ? Fluid or blood. ? Warmth. ? Pus or a bad smell. Managing pain, stiffness, and swelling  For 24 hours after the procedure: ? Avoid using heat on the injection site. ? Do not take baths, swim, or use a hot tub until your health care provider approves. Ask your health care provider if you may take a shower. You may only be allowed to take sponge baths.  If directed, put ice on the injection site. To do this: ? Put ice in a plastic bag. ? Place a towel between your skin and the bag. ? Leave the ice on for 20 minutes, 2-3 times a day.  Activity  Do not drive for 24 hours if you were given a sedative during your procedure.  Return to your normal activities as told by your health care provider. Ask your health care provider what activities are safe for you. General instructions  Your blood pressure, heart rate, breathing rate, and blood oxygen level will be monitored until you leave the hospital or clinic.  Your arm or leg  may feel weak or numb for a few hours.  The injection site may feel sore.  Take over-the-counter and prescription medicines only as told by your health care provider.  Drink enough fluid to keep your urine pale yellow.  Keep all follow-up visits as told by your health care provider. This is important. Contact a health care provider if:  You have any of these signs of infection: ? Redness, swelling, or pain around your injection site. ? Fluid or blood coming from your injection site. ? Warmth coming from your injection site. ? Pus or a bad smell coming from your injection site. ? A fever.  You continue to have pain and soreness around the injection site, even after taking over-the-counter pain medicine.  You have severe, sudden, or lasting nausea or vomiting. Get help right away if:  You have severe pain at the injection site that is not relieved by medicines.  You develop a severe headache or a stiff neck.  You become sensitive to light.  You have any new numbness or weakness in your legs or arms.  You lose control of your bladder or bowel movements.  You have trouble breathing. Summary  An epidural steroid injection is a shot of steroid medicine and numbing medicine that is given into the epidural space.  The shot helps relieve pain caused by an irritated or swollen nerve root.  You are more likely to benefit from this injection if your pain is strong and comes on suddenly rather than if you have had chronic pain. This information is not intended to replace advice given to you by your health care provider. Make sure you discuss any questions you have with your health care provider. Document Revised: 11/23/2018 Document Reviewed: 11/23/2018 Elsevier Patient Education  [place].

## 2020-05-25 ENCOUNTER — Ambulatory Visit
Admission: RE | Admit: 2020-05-25 | Discharge: 2020-05-25 | Disposition: A | Discharge status: Home / Self Care | Payer: Managed Care, Other (non HMO) | Source: Ambulatory Visit | Attending: Nurse Practitioner | Admitting: Nurse Practitioner

## 2020-05-25 DIAGNOSIS — M5412 Radiculopathy, cervical region: Secondary | ICD-10-CM

## 2020-05-25 DIAGNOSIS — M542 Cervicalgia: Secondary | ICD-10-CM

## 2020-05-26 ENCOUNTER — Encounter: Payer: Self-pay | Admitting: Internal Medicine

## 2020-05-31 ENCOUNTER — Encounter: Payer: Self-pay | Admitting: Dermatology

## 2020-05-31 ENCOUNTER — Ambulatory Visit: Payer: Managed Care, Other (non HMO) | Admitting: Dermatology

## 2020-05-31 ENCOUNTER — Other Ambulatory Visit: Payer: Self-pay

## 2020-05-31 DIAGNOSIS — L820 Inflamed seborrheic keratosis: Secondary | ICD-10-CM | POA: Diagnosis not present

## 2020-05-31 DIAGNOSIS — L853 Xerosis cutis: Secondary | ICD-10-CM

## 2020-05-31 DIAGNOSIS — L821 Other seborrheic keratosis: Secondary | ICD-10-CM

## 2020-05-31 DIAGNOSIS — L578 Other skin changes due to chronic exposure to nonionizing radiation: Secondary | ICD-10-CM | POA: Diagnosis not present

## 2020-05-31 LAB — MAGNESIUM: Magnesium: 2.1 mg/dL (ref 1.6–2.3)

## 2020-05-31 LAB — COMP. METABOLIC PANEL (12)
AST: 19 IU/L (ref 0–40)
Albumin/Globulin Ratio: 1.2 (ref 1.2–2.2)
Albumin: 3.7 g/dL — ABNORMAL LOW (ref 3.8–4.8)
Alkaline Phosphatase: 105 IU/L (ref 44–121)
BUN/Creatinine Ratio: 16 (ref 10–24)
BUN: 41 mg/dL — ABNORMAL HIGH (ref 8–27)
Bilirubin Total: 0.3 mg/dL (ref 0.0–1.2)
Calcium: 9.3 mg/dL (ref 8.6–10.2)
Chloride: 100 mmol/L (ref 96–106)
Creatinine, Ser: 2.53 mg/dL — ABNORMAL HIGH (ref 0.76–1.27)
GFR calc Af Amer: 30 mL/min/{1.73_m2} — ABNORMAL LOW (ref >59)
GFR calc non Af Amer: 26 mL/min/{1.73_m2} — ABNORMAL LOW (ref >59)
Globulin, Total: 3.2 g/dL (ref 1.5–4.5)
Glucose: 171 mg/dL — ABNORMAL HIGH (ref 65–99)
Potassium: 4.5 mmol/L (ref 3.5–5.2)
Sodium: 139 mmol/L (ref 134–144)
Total Protein: 6.9 g/dL (ref 6.0–8.5)

## 2020-05-31 LAB — 25-HYDROXY VITAMIN D LCMS D2+D3
25-Hydroxy, Vitamin D-2: <1 ng/mL
25-Hydroxy, Vitamin D-3: 12 ng/mL
25-Hydroxy, Vitamin D: 12 ng/mL — ABNORMAL LOW

## 2020-05-31 LAB — C-REACTIVE PROTEIN: CRP: 7 mg/L (ref 0–10)

## 2020-05-31 LAB — VITAMIN B12: Vitamin B-12: 749 pg/mL (ref 232–1245)

## 2020-05-31 LAB — SEDIMENTATION RATE: Sed Rate: 72 mm/hr — ABNORMAL HIGH (ref 0–30)

## 2020-05-31 NOTE — Progress Notes (Signed)
   New Patient Visit  Subjective  [name] [name] is a 60 y.o. male who presents for the following: Other (Spot of right forearm that itches sometimes).  He has other areas to be evaluated today.  Referred by Dr. [name]  The following portions of the chart were reviewed this encounter and updated as appropriate:   Tobacco  Allergies  Meds  Problems  Med Hx  Surg Hx  Fam Hx     Review of Systems:  No other skin or systemic complaints except as noted in HPI or Assessment and Plan.  Objective  Well appearing patient in no apparent distress; mood and affect are within normal limits.  A focused examination was performed including arms. Relevant physical exam findings are noted in the Assessment and Plan.  Objective  R forearm x 1: Erythematous keratotic or waxy stuck-on papule or plaque.   Objective  Extremities: Dry skin.   Assessment & Plan    Actinic Damage - chronic, secondary to cumulative UV radiation exposure/sun exposure over time - diffuse scaly erythematous macules with underlying dyspigmentation - Recommend daily broad spectrum sunscreen SPF 30+ to sun-exposed areas, reapply every 2 hours as needed.  - Call for new or changing lesions.  Seborrheic Keratoses - Stuck-on, waxy, tan-brown papules and plaques  - Discussed benign etiology and prognosis. - Observe - Call for any changes  Inflamed seborrheic keratosis R forearm x 1  Destruction of lesion - R forearm x 1 Complexity: simple   Destruction method: cryotherapy   Informed consent: discussed and consent obtained   Timeout:  patient name, date of birth, surgical site, and procedure verified Lesion destroyed using liquid nitrogen: Yes   Region frozen until ice ball extended beyond lesion: Yes   Outcome: patient tolerated procedure well with no complications   Post-procedure details: wound care instructions given    Xerosis cutis Extremities  Recommend CeraVe cream and/or Amlactin Rapid Relief  daily.   Return if symptoms worsen or fail to improve.  I, Ashok Cordia, CMA, am acting as scribe for Sarina Ser, MD .  Documentation: I have reviewed the above documentation for accuracy and completeness, and I agree with the above.  Sarina Ser, MD

## 2020-06-01 ENCOUNTER — Encounter: Payer: Self-pay | Admitting: Pain Medicine

## 2020-06-01 ENCOUNTER — Other Ambulatory Visit: Payer: Self-pay

## 2020-06-01 ENCOUNTER — Ambulatory Visit
Admission: RE | Admit: 2020-06-01 | Discharge: 2020-06-01 | Disposition: A | Discharge status: Home / Self Care | Payer: Managed Care, Other (non HMO) | Source: Ambulatory Visit | Attending: Pain Medicine | Admitting: Pain Medicine

## 2020-06-01 ENCOUNTER — Ambulatory Visit (HOSPITAL_BASED_OUTPATIENT_CLINIC_OR_DEPARTMENT_OTHER): Payer: Managed Care, Other (non HMO) | Admitting: Pain Medicine

## 2020-06-01 VITALS — BP 162/93 | HR 76 | Temp 97.3°F | Resp 16 | Ht 73.0 in | Wt 202.0 lb

## 2020-06-01 DIAGNOSIS — M47812 Spondylosis without myelopathy or radiculopathy, cervical region: Secondary | ICD-10-CM | POA: Diagnosis present

## 2020-06-01 DIAGNOSIS — N184 Chronic kidney disease, stage 4 (severe): Secondary | ICD-10-CM

## 2020-06-01 DIAGNOSIS — R7989 Other specified abnormal findings of blood chemistry: Secondary | ICD-10-CM

## 2020-06-01 DIAGNOSIS — E559 Vitamin D deficiency, unspecified: Secondary | ICD-10-CM | POA: Insufficient documentation

## 2020-06-01 DIAGNOSIS — E118 Type 2 diabetes mellitus with unspecified complications: Secondary | ICD-10-CM | POA: Insufficient documentation

## 2020-06-01 DIAGNOSIS — R700 Elevated erythrocyte sedimentation rate: Secondary | ICD-10-CM | POA: Diagnosis present

## 2020-06-01 DIAGNOSIS — M542 Cervicalgia: Secondary | ICD-10-CM

## 2020-06-01 DIAGNOSIS — M4802 Spinal stenosis, cervical region: Secondary | ICD-10-CM

## 2020-06-01 DIAGNOSIS — E1165 Type 2 diabetes mellitus with hyperglycemia: Secondary | ICD-10-CM | POA: Insufficient documentation

## 2020-06-01 DIAGNOSIS — R7309 Other abnormal glucose: Secondary | ICD-10-CM

## 2020-06-01 DIAGNOSIS — E8809 Other disorders of plasma-protein metabolism, not elsewhere classified: Secondary | ICD-10-CM | POA: Insufficient documentation

## 2020-06-01 DIAGNOSIS — IMO0002 Reserved for concepts with insufficient information to code with codable children: Secondary | ICD-10-CM

## 2020-06-01 DIAGNOSIS — M5030 Other cervical disc degeneration, unspecified cervical region: Secondary | ICD-10-CM | POA: Diagnosis present

## 2020-06-01 DIAGNOSIS — M4310 Spondylolisthesis, site unspecified: Secondary | ICD-10-CM | POA: Insufficient documentation

## 2020-06-01 DIAGNOSIS — Z794 Long term (current) use of insulin: Secondary | ICD-10-CM

## 2020-06-01 DIAGNOSIS — E1122 Type 2 diabetes mellitus with diabetic chronic kidney disease: Secondary | ICD-10-CM | POA: Insufficient documentation

## 2020-06-01 MED ORDER — ERGOCALCIFEROL 1.25 MG (50000 UT) PO CAPS: 50000.0000 [IU] | ORAL_CAPSULE | ORAL | 0 refills | Status: AC

## 2020-06-01 MED ORDER — VITAMIN D3 125 MCG (5000 UT) PO CAPS: 1.0000 | ORAL_CAPSULE | Freq: Every day | ORAL | 2 refills | Status: AC

## 2020-06-01 MED ORDER — FENTANYL CITRATE (PF) 100 MCG/2ML IJ SOLN: 25.0000 ug | INTRAMUSCULAR | Status: DC | PRN

## 2020-06-01 MED ORDER — IOHEXOL 180 MG/ML  SOLN: 10.0000 mL | Freq: Once | INTRAMUSCULAR | Status: DC

## 2020-06-01 MED ORDER — ROPIVACAINE HCL 2 MG/ML IJ SOLN
1.0000 mL | Freq: Once | INTRAMUSCULAR | Status: AC
  Administered 2020-06-01: 1 mL via EPIDURAL
  Filled 2020-06-01: qty 10

## 2020-06-01 MED ORDER — SODIUM CHLORIDE 0.9% FLUSH
1.0000 mL | Freq: Once | INTRAVENOUS | Status: AC
  Administered 2020-06-01: 1 mL

## 2020-06-01 MED ORDER — DEXAMETHASONE SODIUM PHOSPHATE 10 MG/ML IJ SOLN
10.0000 mg | Freq: Once | INTRAMUSCULAR | Status: AC
  Administered 2020-06-01: 10 mg
  Filled 2020-06-01: qty 1

## 2020-06-01 MED ORDER — LIDOCAINE HCL 2 % IJ SOLN
20.0000 mL | Freq: Once | INTRAMUSCULAR | Status: AC
  Administered 2020-06-01: 200 mg

## 2020-06-01 MED ORDER — MIDAZOLAM HCL 5 MG/5ML IJ SOLN: 1.0000 mg | INTRAMUSCULAR | Status: DC | PRN

## 2020-06-01 NOTE — Patient Instructions (Addendum)
____________________________________________________________________________________________  Post-Procedure Discharge Instructions  Instructions:  Apply ice:   Purpose: This will minimize any swelling and discomfort after procedure.   When: Day of procedure, as soon as you get home.  How: Fill a plastic sandwich bag with crushed ice. Cover it with a small towel and apply to injection site.  How long: (15 min on, 15 min off) Apply for 15 minutes then remove x 15 minutes.  Repeat sequence on day of procedure, until you go to bed.  Apply heat:   Purpose: To treat any soreness and discomfort from the procedure.  When: Starting the next day after the procedure.  How: Apply heat to procedure site starting the day following the procedure.  How long: May continue to repeat daily, until discomfort goes away.  Food intake: Start with clear liquids (like water) and advance to regular food, as tolerated.   Physical activities: Keep activities to a minimum for the first 8 hours after the procedure. After that, then as tolerated.  Driving: If you have received any sedation, be responsible and do not drive. You are not allowed to drive for 24 hours after having sedation.  Blood thinner: (Applies only to those taking blood thinners) You may restart your blood thinner 6 hours after your procedure.  Insulin: (Applies only to Diabetic patients taking insulin) As soon as you can eat, you may resume your normal dosing schedule.  Infection prevention: Keep procedure site clean and dry. Shower daily and clean area with soap and water.  Post-procedure Pain Diary: Extremely important that this be done correctly and accurately. Recorded information will be used to determine the next step in treatment. For the purpose of accuracy, follow these rules:  Evaluate only the area treated. Do not report or include pain from an untreated area. For the purpose of this evaluation, ignore all other areas of pain,  except for the treated area.  After your procedure, avoid taking a long nap and attempting to complete the pain diary after you wake up. Instead, set your alarm clock to go off every hour, on the hour, for the initial 8 hours after the procedure. Document the duration of the numbing medicine, and the relief you are getting from it.  Do not go to sleep and attempt to complete it later. It will not be accurate. If you received sedation, it is likely that you were given a medication that may cause amnesia. Because of this, completing the diary at a later time may cause the information to be inaccurate. This information is needed to plan your care.  Follow-up appointment: Keep your post-procedure follow-up evaluation appointment after the procedure (usually 2 weeks for most procedures, 6 weeks for radiofrequencies). DO NOT FORGET to bring you pain diary with you.   Expect: (What should I expect to see with my procedure?)  From numbing medicine (AKA: Local Anesthetics): Numbness or decrease in pain. You may also experience some weakness, which if present, could last for the duration of the local anesthetic.  Onset: Full effect within 15 minutes of injected.  Duration: It will depend on the type of local anesthetic used. On the average, 1 to 8 hours.   From steroids (Applies only if steroids were used): Decrease in swelling or inflammation. Once inflammation is improved, relief of the pain will follow.  Onset of benefits: Depends on the amount of swelling present. The more swelling, the longer it will take for the benefits to be seen. In some cases, up to 10 days.    Duration: Steroids will stay in the system x 2 weeks. Duration of benefits will depend on multiple posibilities including persistent irritating factors.  Side-effects: If present, they may typically last 2 weeks (the duration of the steroids).  Frequent: Cramps (if they occur, drink Gatorade and take over-the-counter Magnesium 450-500 mg  once to twice a day); water retention with temporary weight gain; increases in blood sugar; decreased immune system response; increased appetite.  Occasional: Facial flushing (red, warm cheeks); mood swings; menstrual changes.  Uncommon: Long-term decrease or suppression of natural hormones; bone thinning. (These are more common with higher doses or more frequent use. This is why we prefer that our patients avoid having any injection therapies in other practices.)   Very Rare: Severe mood changes; psychosis; aseptic necrosis.  From procedure: Some discomfort is to be expected once the numbing medicine wears off. This should be minimal if ice and heat are applied as instructed.  Call if: (When should I call?)  You experience numbness and weakness that gets worse with time, as opposed to wearing off.  New onset bowel or bladder incontinence. (Applies only to procedures done in the spine)  Emergency Numbers:  Durning business hours (Monday - Thursday, 8:00 AM - 4:00 PM) (Friday, 9:00 AM - 12:00 Noon): [phone]  After hours: [phone]  NOTE: If you are having a problem and are unable connect with, or to talk to a provider, then go to your nearest urgent care or emergency department. If the problem is serious and urgent, please call 911. ____________________________________________________________________________________________   Pain Management Discharge Instructions  General Discharge Instructions :  If you need to reach your doctor call: Monday-Friday 8:00 am - 4:00 pm at [phone] or toll free [phone].  After clinic hours [phone] to have operator reach doctor.  Bring all of your medication bottles to all your appointments in the pain clinic.  To cancel or reschedule your appointment with Pain Management please remember to call 24 hours in advance to avoid a fee.  Refer to the educational materials which you have been given on: General Risks, I had my  Procedure. Discharge Instructions, Post Sedation.  Post Procedure Instructions:  The drugs you were given will stay in your system until tomorrow, so for the next 24 hours you should not drive, make any legal decisions or drink any alcoholic beverages.  You may eat anything you prefer, but it is better to start with liquids then soups and crackers, and gradually work up to solid foods.  Please notify your doctor immediately if you have any unusual bleeding, trouble breathing or pain that is not related to your normal pain.  Depending on the type of procedure that was done, some parts of your body may feel week and/or numb.  This usually clears up by tonight or the next day.  Walk with the use of an assistive device or accompanied by an adult for the 24 hours.  You may use ice on the affected area for the first 24 hours.  Put ice in a Ziploc bag and cover with a towel and place against area 15 minutes on 15 minutes off.  You may switch to heat after 24 hours.Epidural Steroid Injection Patient Information  Description: The epidural space surrounds the nerves as they exit the spinal cord.  In some patients, the nerves can be compressed and inflamed by a bulging disc or a tight spinal canal (spinal stenosis).  By injecting steroids into the epidural space, we can bring irritated nerves   into direct contact with a potentially helpful medication.  These steroids act directly on the irritated nerves and can reduce swelling and inflammation which often leads to decreased pain.  Epidural steroids may be injected anywhere along the spine and from the neck to the low back depending upon the location of your pain.   After numbing the skin with local anesthetic (like Novocaine), a small needle is passed into the epidural space slowly.  You may experience a sensation of pressure while this is being done.  The entire block usually last less than 10 minutes.  Conditions which may be treated by epidural  steroids:   Low back and leg pain  Neck and arm pain  Spinal stenosis  Post-laminectomy syndrome  Herpes zoster (shingles) pain  Pain from compression fractures  Preparation for the injection:  1. Do not eat any solid food or dairy products within 8 hours of your appointment.  2. You may drink clear liquids up to 3 hours before appointment.  Clear liquids include water, black coffee, juice or soda.  No milk or cream please. 3. You may take your regular medication, including pain medications, with a sip of water before your appointment  Diabetics should hold regular insulin (if taken separately) and take 1/2 normal NPH dos the morning of the procedure.  Carry some sugar containing items with you to your appointment. 4. A driver must accompany you and be prepared to drive you home after your procedure.  5. Bring all your current medications with your. 6. An IV may be inserted and sedation may be given at the discretion of the physician.   7. A blood pressure cuff, EKG and other monitors will often be applied during the procedure.  Some patients may need to have extra oxygen administered for a short period. 8. You will be asked to provide medical information, including your allergies, prior to the procedure.  We must know immediately if you are taking blood thinners (like Coumadin/Warfarin)  Or if you are allergic to IV iodine contrast (dye). We must know if you could possible be pregnant.  Possible side-effects:  Bleeding from needle site  Infection (rare, may require surgery)  Nerve injury (rare)  Numbness & tingling (temporary)  Difficulty urinating (rare, temporary)  Spinal headache ( a headache worse with upright posture)  Light -headedness (temporary)  Pain at injection site (several days)  Decreased blood pressure (temporary)  Weakness in arm/leg (temporary)  Pressure sensation in back/neck (temporary)  Call if you experience:  Fever/chills associated with  headache or increased back/neck pain.  Headache worsened by an upright position.  New onset weakness or numbness of an extremity below the injection site  Hives or difficulty breathing (go to the emergency room)  Inflammation or drainage at the infection site  Severe back/neck pain  Any new symptoms which are concerning to you  Please note:  Although the local anesthetic injected can often make your back or neck feel good for several hours after the injection, the pain will likely return.  It takes 3-7 days for steroids to work in the epidural space.  You may not notice any pain relief for at least that one week.  If effective, we will often do a series of three injections spaced 3-6 weeks apart to maximally decrease your pain.  After the initial series, we generally will wait several months before considering a repeat injection of the same type.  If you have any questions, please call [phone] [facility] 

## 2020-06-01 NOTE — Progress Notes (Signed)
PROVIDER NOTE: Information contained herein reflects review and annotations entered in association with encounter. Interpretation of such information and data should be left to medically-trained personnel. Information provided to patient can be located elsewhere in the medical record under "Patient Instructions". Document created using STT-dictation technology, any transcriptional errors that may result from process are unintentional.    Patient: [name] [name]  Service Category: Procedure  Provider: Gaspar Cola, MD  DOB: 02/08/1961  DOS: 06/01/2020  Location: [facility]  MRN: [id number]  Setting: Ambulatory - outpatient  Referring Provider: Milinda Pointer, MD  Type: Established Patient  Specialty: Interventional Pain Management  PCP: [name], MD   Primary Reason for Visit: Interventional Pain Management Treatment. CC: Neck Pain (Worse on the left )  Procedure:          Anesthesia, Analgesia, Anxiolysis:  Type: Diagnostic, Inter-Laminar, Cervical Epidural Steroid Injection  #1  Region: Posterior Cervico-thoracic Region Level: C7-T1 Laterality: Left-Sided Paramedial  Type: Local Anesthesia Indication(s): Analgesia         Route: Infiltration ([place]/IM) IV Access: Declined Sedation: None since a driver was not available  Local Anesthetic: Lidocaine 1-2%  Position: Prone with head of the table was raised to facilitate breathing.   Indications: 1. Cervicalgia   2. DDD (degenerative disc disease), cervical   3. 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 Cervical Grade 1 Retrolisthesis of C4/C5 and C5/C6   5. Cervical facet syndrome (Left)    Pain Score: Pre-procedure: 4 /10 Post-procedure: 4 /10   On the patient's initial evaluation the primary area of pain was that of his feet and hands with the feet being worse than the hands.  He describes the pain to be bilateral and he also describes having no feeling from his knees down.  He indicates that  this is secondary to a diabetic peripheral neuropathy.  The patient's next area of pain is that of the neck on the left side.  He describes the pain to have been there for the past 4 weeks prior to that evaluation.  He described this pain as being out sudden onset.  He denied shoulder pain or upper extremity pain except for what he calls the peripheral neuropathy.  He also indicated not having any specific finger pain or numbness.  He describes all of them to be equally affected.  In addition to this the patient did admit to having headaches that usually involved the very top of his head and the typical distribution of the greater occipital nerve, bilaterally.  However, he denied any pain in the back of the head.  The patient has a history of having passed out and fallen but he describes that this pain that he has in the neck he had a before that fall.  He was unable to explain to me why he passed out and he refers that he has not been told as to why that happened.  Interestingly, the patient describes that he has had this pains for more than 10 years except for the one in the neck.  He described taking Flexeril and hydrocodone as needed. Other medical conditions included IDDM, CAD, PVD, CKD, and COPD.  Physical exam demonstrated an individual that was brought into the room on a wheelchair with evidence of bilateral hand muscle atrophy.  Range of motion of the cervical spine was relatively within normal limits except that rotating the head towards the right side reproduce the pain on the left side of the neck.  Rotating towards the left also reproduces the pain in the left side of the neck.  Hyperextension, rotation, and lateral bending maneuver did reproduce the patient's pain on the left side but not on the right.  Cervical spine compression test was negative.  Prior labwork done on 03/13/2020 demonstrated the patient to be having electrolyte problems with a hypokalemia of 2.6, which could have  contributed to the patient "passing out".  The patient is scheduled to undergo a cervical MRI tomorrow.  I believe this would be very helpful in getting some light as to what may be causing this problem.  Controlled Substance Pharmacotherapy Assessment REMS (Risk Evaluation and Mitigation Strategy)  Analgesic: None MME/day: 0 mg/day  Risk Assessment Profile: Aberrant behavior: See initial evaluations. None observed or detected today Comorbid factors increasing risk of overdose: See initial evaluation. No additional risks detected today Opioid risk tool (ORT):  Opioid Risk  05/24/2020  Alcohol 0  Illegal Drugs 0  Rx Drugs 0  Alcohol 0  Illegal Drugs 0  Rx Drugs 0  Age between 16-45 years  0  History of Preadolescent Sexual Abuse 0  Psychological Disease 0  Opioid Risk Tool Scoring 0  Opioid Risk Interpretation Low Risk    ORT Scoring interpretation table:  Score <3 = Low Risk for SUD  Score between 4-7 = Moderate Risk for SUD  Score >8 = High Risk for Opioid Abuse   Risk of substance use disorder (SUD): Low  Laboratory Chemistry Profile   Renal Lab Results  Component Value Date   BUN 41 (H) 05/24/2020   CREATININE 2.53 (H) 05/24/2020   BCR 16 05/24/2020   GFRAA 30 (L) 05/24/2020   GFRNONAA 26 (L) 05/24/2020     Electrolytes Lab Results  Component Value Date   NA 139 05/24/2020   K 4.5 05/24/2020   CL 100 05/24/2020   CALCIUM 9.3 05/24/2020   MG 2.1 05/24/2020     Hepatic Lab Results  Component Value Date   AST 19 05/24/2020   ALT 28 12/02/2019   ALBUMIN 3.7 (L) 05/24/2020   ALKPHOS 105 05/24/2020     ID Lab Results  Component Value Date   SARSCOV2NAA NEGATIVE 03/31/2020     Bone Lab Results  Component Value Date   25OHVITD1 12 (L) 05/24/2020   25OHVITD2 <1.0 05/24/2020   25OHVITD3 12 05/24/2020     Endocrine Lab Results  Component Value Date   GLUCOSE 171 (H) 05/24/2020   HGBA1C 14.0 (A) 02/18/2020   TSH 1.730 12/02/2019   FREET4 1.30  12/02/2019     Neuropathy Lab Results  Component Value Date   VITAMINB12 749 05/24/2020   HGBA1C 14.0 (A) 02/18/2020     CNS No results found.   Inflammation (CRP: Acute  ESR: Chronic) Lab Results  Component Value Date   CRP 7 05/24/2020   ESRSEDRATE 72 (H) 05/24/2020     Rheumatology No results found.   Coagulation Lab Results  Component Value Date   PLT 334 03/13/2020     Cardiovascular Lab Results  Component Value Date   BNP 373.0 (H) 08/02/2019   CKTOTAL 206 12/29/2012   HGB 14.9 03/13/2020   HCT 41.4 03/13/2020     Screening Lab Results  Component Value Date   SARSCOV2NAA NEGATIVE 03/31/2020     Cancer No results found.   Allergens No results found.     Note: Lab results reviewed.  Recent Diagnostic Imaging Review  Cervical Imaging: Cervical DG complete: Results for orders placed  during the hospital encounter of 03/27/20 DG Cervical Spine Complete  Narrative CLINICAL DATA:  Cervical spine crepitus  EXAM: CERVICAL SPINE - COMPLETE 4+ VIEW  COMPARISON:  None  FINDINGS: There is straightening of the cervical spine. No acute fracture. Minimal retrolisthesis of C4 upon C5 and C5 upon C6 is likely degenerative in nature. Vertebral body height has been preserved. There is severe intervertebral disc space narrowing and endplate remodeling at F0-2 and C5-6 in keeping with changes of severe degenerative disc disease. The spinal canal is widely patent. The prevertebral soft tissues are unremarkable. Oblique views fail to profile the right neural foramina. The left neural foramen are not optimally profiled, however, there is at least mild left neural foraminal narrowing at C4-5 and C5-6 secondary to uncovertebral arthrosis. Lateral masses of C1 are well aligned with the body of C2.  IMPRESSION: Advanced degenerative disc disease C4-5 and C5-6 with at least mild left neural foraminal narrowing at this level.   Electronically Signed By: Fidela Salisbury MD On: 03/27/2020 23:56  Complexity Note: Imaging results reviewed. Results shared with Mr. Yanes, using Layman's terms.  Meds   Current Outpatient Medications:  .  albuterol (ACCUNEB) 0.63 MG/3ML nebulizer solution, Inhale into the lungs., Disp: , Rfl:  .  Alirocumab (PRALUENT) 75 MG/ML SOAJ, Inject 75 mg into the skin every 14 (fourteen) days., Disp: 2 pen, Rfl: 11 .  aspirin EC 81 MG tablet, Take 81 mg by mouth daily. Swallow whole., Disp: , Rfl:  .  carvedilol (COREG) 12.5 MG tablet, Take 12.5 mg by mouth 2 (two) times daily with a meal., Disp: , Rfl:  .  cholecalciferol (VITAMIN D3) 25 MCG (1000 UNIT) tablet, Take 2,000 Units by mouth daily., Disp: , Rfl:  .  diclofenac Sodium (VOLTAREN) 1 % GEL, Apply 2 g topically in the morning and at bedtime. To the neck muscle, Disp: 150 g, Rfl: 1 .  ezetimibe (ZETIA) 10 MG tablet, TAKE 1 TABLET BY MOUTH EVERY DAY, Disp: 90 tablet, Rfl: 1 .  fluticasone furoate-vilanterol (BREO ELLIPTA) 100-25 MCG/INH AEPB, Inhale 1 puff into the lungs daily., Disp: , Rfl:  .  HYDROcodone-acetaminophen (NORCO) 7.5-325 MG tablet, Take 1 tablet by mouth as needed for moderate pain., Disp: , Rfl:  .  insulin degludec (TRESIBA FLEXTOUCH) 200 UNIT/ML FlexTouch Pen, Inject 140 Units into the skin daily., Disp: 21 mL, Rfl: 5 .  insulin lispro (HUMALOG) 100 UNIT/ML KwikPen, Inject into the skin. 0-20 units per sliding scale, Disp: , Rfl:  .  Insulin Pen Needle (B-D ULTRAFINE III SHORT PEN) 31G X 8 MM MISC, To use with pen basal and mealtime coverage insulin pens 5 times daily. DX: E11.65, Disp: 500 each, Rfl: 1 .  metoCLOPramide (REGLAN) 5 MG tablet, One tab before each meal and one at bed time for dyspepsia prn, Disp: 120 tablet, Rfl: 2 .  metolazone (ZAROXOLYN) 5 MG tablet, Take 5 mg by mouth 2 (two) times a week. As needed for leg swelling, Disp: , Rfl:  .  pantoprazole (PROTONIX) 40 MG tablet, Take 1 tablet (40 mg total) by mouth 2 (two) times daily before a  meal., Disp: 180 tablet, Rfl: 1 .  torsemide (DEMADEX) 20 MG tablet, Take 2 tablets (40 mg) by mouth daily in the morning, Disp: , Rfl:   ROS  Constitutional: Denies any fever or chills Gastrointestinal: No reported hemesis, hematochezia, vomiting, or acute GI distress Musculoskeletal: Denies any acute onset joint swelling, redness, loss of ROM, or weakness Neurological: No  reported episodes of acute onset apraxia, aphasia, dysarthria, agnosia, amnesia, paralysis, loss of coordination, or loss of consciousness  Allergies  Mr. Bartek is allergic to other, statins, atorvastatin, entresto [sacubitril-valsartan], pravastatin, pregabalin, and rosuvastatin.  PFSH  Drug: Mr. Newsham  reports no history of drug use. Alcohol:  reports current alcohol use. Tobacco:  reports that he quit smoking about 8 years ago. His smoking use included cigarettes. He has never used smokeless tobacco. Medical:  has a past medical history of Arrhythmia, Arthritis, CAD (coronary artery disease), Colon polyps, Diabetes ([place]), Dupre's syndrome, GERD (gastroesophageal reflux disease), HFimpEF (heart failure with improved ejection fraction) ([place]), Hyperlipidemia, Hypertension, Ischemic cardiomyopathy, Lymphedema, Migraine, Neuropathy, PAD (peripheral artery disease) ([place]), Peripheral vascular disease ([place]), Retinopathy, Sleep apnea, Stage 3 chronic kidney disease ([place]), and Stomach ulcer. Surgical: Mr. Schellenberg  has a past surgical history that includes Cardiac surgery; bone graft surgery; Eye surgery; Wrist surgery; Coronary artery bypass graft (2018); Cardiac catheterization (Right, 10/28/2016); Cardiac catheterization (2013); Cardiac catheterization (2018); and Esophagogastroduodenoscopy (egd) with propofol (N/A, 04/04/2020). Family: family history includes Diabetes in his mother; Heart disease in his father.  Constitutional Exam  General appearance: Well nourished, well developed, and well hydrated. In no apparent acute  distress There were no vitals filed for this visit. BMI Assessment: Estimated body mass index is 26.32 kg/m as calculated from the following:   Height as of 05/24/20: 6' 2" (1.88 m).   Weight as of 05/24/20: 205 lb (93 kg).  BMI interpretation table: BMI level Category Range association with higher incidence of chronic pain  <18 kg/m2 Underweight   18.5-24.9 kg/m2 Ideal body weight   25-29.9 kg/m2 Overweight Increased incidence by 20%  30-34.9 kg/m2 Obese (Class I) Increased incidence by 68%  35-39.9 kg/m2 Severe obesity (Class II) Increased incidence by 136%  >40 kg/m2 Extreme obesity (Class III) Increased incidence by 254%   Patient's current BMI Ideal Body weight  There is no height or weight on file to calculate BMI. Ideal body weight: 82.2 kg (181 lb 3.5 oz) Adjusted ideal body weight: 86.5 kg (190 lb 11.7 oz)   BMI Readings from Last 4 Encounters:  05/24/20 26.32 kg/m  05/16/20 25.99 kg/m  05/02/20 27.39 kg/m  04/25/20 26.78 kg/m   Wt Readings from Last 4 Encounters:  05/24/20 205 lb (93 kg)  05/16/20 197 lb (89.4 kg)  05/02/20 207 lb 9.6 oz (94.2 kg)  04/25/20 203 lb (92.1 kg)    Psych/Mental status: Alert, oriented x 3 (person, place, & time)       Eyes: PERLA Respiratory: No evidence of acute respiratory distress    Pre-op H&P Assessment:  Mr. Faro is a 60 y.o. (year old), male patient, seen today for interventional treatment. He  has a past surgical history that includes Cardiac surgery; bone graft surgery; Eye surgery; Wrist surgery; Coronary artery bypass graft (2018); Cardiac catheterization (Right, 10/28/2016); Cardiac catheterization (2013); Cardiac catheterization (2018); and Esophagogastroduodenoscopy (egd) with propofol (N/A, 04/04/2020). Mr. Gaunt has a current medication list which includes the following prescription(s): albuterol, praluent, aspirin ec, carvedilol, vitamin d3, cholecalciferol, diclofenac sodium, ergocalciferol, ezetimibe, breo  ellipta, hydrocodone-acetaminophen, tresiba flextouch, insulin lispro, b-d ultrafine iii short pen, metoclopramide, metolazone, pantoprazole, and torsemide. His primarily concern today is the Neck Pain (Worse on the left )  Initial Vital Signs:  Pulse/HCG Rate: 76ECG Heart Rate: 87 Temp: (!) 97.3 F (36.3 C) Resp: 16 BP: (!) 154/82 SpO2: 100 %  BMI: Estimated body mass index is 26.65 kg/m as calculated from the following:  Height as of this encounter: 6' 1" (1.854 m).   Weight as of this encounter: 202 lb (91.6 kg).  Risk Assessment: Allergies: Reviewed. He is allergic to contrast media [iodinated diagnostic agents], other, statins, atorvastatin, entresto [sacubitril-valsartan], pravastatin, pregabalin, and rosuvastatin.  Allergy Precautions: None required Coagulopathies: Reviewed. None identified.  Blood-thinner therapy: None at this time Active Infection(s): Reviewed. None identified. Mr. Laduca is afebrile  Site Confirmation: Mr. Hommes was asked to confirm the procedure and laterality before marking the site Procedure checklist: Completed Consent: Before the procedure and under the influence of no sedative(s), amnesic(s), or anxiolytics, the patient was informed of the treatment options, risks and possible complications. To fulfill our ethical and legal obligations, as recommended by the American Medical Association's Code of Ethics, I have informed the patient of my clinical impression; the nature and purpose of the treatment or procedure; the risks, benefits, and possible complications of the intervention; the alternatives, including doing nothing; the risk(s) and benefit(s) of the alternative treatment(s) or procedure(s); and the risk(s) and benefit(s) of doing nothing. The patient was provided information about the general risks and possible complications associated with the procedure. These may include, but are not limited to: failure to achieve desired goals, infection, bleeding,  organ or nerve damage, allergic reactions, paralysis, and death. In addition, the patient was informed of those risks and complications associated to Spine-related procedures, such as failure to decrease pain; infection (i.e.: Meningitis, epidural or intraspinal abscess); bleeding (i.e.: epidural hematoma, subarachnoid hemorrhage, or any other type of intraspinal or peri-dural bleeding); organ or nerve damage (i.e.: Any type of peripheral nerve, nerve root, or spinal cord injury) with subsequent damage to sensory, motor, and/or autonomic systems, resulting in permanent pain, numbness, and/or weakness of one or several areas of the body; allergic reactions; (i.e.: anaphylactic reaction); and/or death. Furthermore, the patient was informed of those risks and complications associated with the medications. These include, but are not limited to: allergic reactions (i.e.: anaphylactic or anaphylactoid reaction(s)); adrenal axis suppression; blood sugar elevation that in diabetics may result in ketoacidosis or comma; water retention that in patients with history of congestive heart failure may result in shortness of breath, pulmonary edema, and decompensation with resultant heart failure; weight gain; swelling or edema; medication-induced neural toxicity; particulate matter embolism and blood vessel occlusion with resultant organ, and/or nervous system infarction; and/or aseptic necrosis of one or more joints. Finally, the patient was informed that Medicine is not an exact science; therefore, there is also the possibility of unforeseen or unpredictable risks and/or possible complications that may result in a catastrophic outcome. The patient indicated having understood very clearly. We have given the patient no guarantees and we have made no promises. Enough time was given to the patient to ask questions, all of which were answered to the patient's satisfaction. Mr. Kakar has indicated that he wanted to continue with  the procedure. Attestation: I, the ordering provider, attest that I have discussed with the patient the benefits, risks, side-effects, alternatives, likelihood of achieving goals, and potential problems during recovery for the procedure that I have provided informed consent. Date  Time: 06/01/2020  9:47 AM  Pre-Procedure Preparation:  Monitoring: As per clinic protocol. Respiration, ETCO2, SpO2, BP, heart rate and rhythm monitor placed and checked for adequate function Safety Precautions: Patient was assessed for positional comfort and pressure points before starting the procedure. Time-out: I initiated and conducted the "Time-out" before starting the procedure, as per protocol. The patient was asked to participate by confirming the  accuracy of the "Time Out" information. Verification of the correct person, site, and procedure were performed and confirmed by me, the nursing staff, and the patient. "Time-out" conducted as per Joint Commission's Universal Protocol (UP.01.01.01). Time: 1022  Description of Procedure:          Target Area: For Epidural Steroid injections the target is the interlaminar space, initially targeting the lower border of the superior vertebral body lamina. Approach: Paramedial approach. Area Prepped: Entire PosteriorCervical Region DuraPrep (Iodine Povacrylex [0.7% available iodine] and Isopropyl Alcohol, 74% w/w) Safety Precautions: Aspiration looking for blood return was conducted prior to all injections. At no point did we inject any substances, as a needle was being advanced. No attempts were made at seeking any paresthesias. Safe injection practices and needle disposal techniques used. Medications properly checked for expiration dates. SDV (single dose vial) medications used. Description of the Procedure: Protocol guidelines were followed. The procedure needle was introduced through the skin, ipsilateral to the reported pain, and advanced to the target area. Bone was  contacted and the needle walked caudad, until the lamina was cleared. The epidural space was identified using "loss-of-resistance technique" with 2-3 ml of PF-NaCl (0.9% NSS), in a 5cc LOR glass syringe. Vitals:   06/01/20 0943 06/01/20 1022 06/01/20 1027 06/01/20 1028  BP: (!) 154/82 (!) 149/83 (!) 160/80 (!) 162/93  Pulse: 76     Resp: _0 Temp: (!) 97.3 F (36.3 C)     TempSrc: Temporal     SpO2: 100% 97% 96% 96%  Weight: 202 lb (91.6 kg)     Height: 6' 1" (1.854 m)       Start Time: 1022 hrs. End Time: 1028 hrs. Materials:  Needle(s) Type: Epidural needle Gauge: 17G Length: 3.5-in Medication(s): Please see orders for medications and dosing details.  Imaging Guidance (Spinal):          Type of Imaging Technique: Fluoroscopy Guidance (Spinal) Indication(s): Assistance in needle guidance and placement for procedures requiring needle placement in or near specific anatomical locations not easily accessible without such assistance. Exposure Time: Please see nurses notes. Contrast: None used. Fluoroscopic Guidance: I was personally present during the use of fluoroscopy. "Tunnel Vision Technique" used to obtain the best possible view of the target area. Parallax error corrected before commencing the procedure. "Direction-depth-direction" technique used to introduce the needle under continuous pulsed fluoroscopy. Once target was reached, antero-posterior, oblique, and lateral fluoroscopic projection used confirm needle placement in all planes. Images permanently stored in EMR. Interpretation: No contrast injected. I personally interpreted the imaging intraoperatively. Adequate needle placement confirmed in multiple planes. Permanent images saved into the patient's record.  Antibiotic Prophylaxis:   Anti-infectives (From admission, onward)   None     Indication(s): None identified  Post-operative Assessment:  Post-procedure Vital Signs:  Pulse/HCG Rate: 7688 Temp: (!) 97.3  F (36.3 C) Resp: 16 BP: (!) 162/93 SpO2: 96 %  EBL: None  Complications: No immediate post-treatment complications observed by team, or reported by patient.  Note: The patient tolerated the entire procedure well. A repeat set of vitals were taken after the procedure and the patient was kept under observation following institutional policy, for this type of procedure. Post-procedural neurological assessment was performed, showing return to baseline, prior to discharge. The patient was provided with post-procedure discharge instructions, including a section on how to identify potential problems. Should any problems arise concerning this procedure, the patient was given instructions to immediately contact us, at any time, without hesitation. In  any case, we plan to contact the patient by telephone for a follow-up status report regarding this interventional procedure.  Comments:  No additional relevant information.  Assessment & Plan  Primary Diagnosis & Pertinent Problem List: The primary encounter diagnosis was Cervicalgia. 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  Visit Diagnosis: 1. Cervicalgia   2. DDD (degenerative disc disease), cervical   3. 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 Cervical Grade 1 Retrolisthesis of C4/C5 and C5/C6   5. Cervical facet syndrome (Left)   6. 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 Serum albumin decreased   8. Vitamin D deficiency   9. Elevated hemoglobin A1c   10. Elevated sed rate   11. Elevated brain natriuretic  peptide (BNP) level    Problems updated and reviewed during this visit: Problem  Diabetes Mellitus With Stage 4 Chronic Kidney Disease Gfr 15-29 ([facility])  Serum Albumin Decreased  Vitamin D Deficiency  Elevated Hemoglobin A1c  Elevated Sed Rate  Elevated Brain Natriuretic Peptide (Bnp) Level  Pharmacologic Therapy   Plan of Care  Orders:  Orders Placed This Encounter  Procedures  . Cervical Epidural Injection    Procedure: Cervical Epidural Steroid Injection/Block Purpose: Diagnostic Indication(s): Radiculitis and cervicalgia associater with cervical degenerative disc disease.    Scheduling Instructions:     Level(s): C7-T1     Laterality: Left-sided     Sedation: Patient's choice.     Timeframe: Today    Order Specific Question:   Where will this procedure be performed?    Answer:   [facility]    Comments:   by Dr. Dossie Arbour  . [facility] C-ARM 1-60 MIN NO REPORT    Intraoperative interpretation by procedural physician at [place] [place].    Standing Status:   Standing    Number of Occurrences:   1    Order Specific Question:   Reason for exam:    Answer:   Assistance in needle guidance and placement for procedures requiring needle placement in or near specific anatomical locations not easily accessible without such assistance.  . Informed Consent Details: Physician/Practitioner Attestation; Transcribe to consent form and obtain patient signature    Nursing Order: Transcribe to consent form and obtain patient signature. Note: Always confirm laterality of pain with Mr. Mierzejewski, before procedure.    Order Specific Question:   Physician/Practitioner attestation of informed consent for procedure/surgical case    Answer:   I, the physician/practitioner, attest that I have discussed with the patient the benefits, risks, side effects, alternatives, likelihood of achieving goals and potential problems during recovery for the procedure that I have provided informed  consent.    Order Specific Question:   Procedure    Answer:   Cervical Epidural Steroid Injection (CESI) under fluoroscopic guidance    Order Specific Question:   Physician/Practitioner performing the procedure    Answer:   Francisco A. 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 Provide equipment / supplies at bedside    "Epidural Tray" (Disposable  single use) Catheter: NOT required    Standing Status:   Standing    Number of Occurrences:   1    Order Specific Question:   Specify    Answer:   Epidural Tray   Chronic Opioid Analgesic:  None MME/day: 0 mg/day   Medications ordered for procedure: Meds ordered this encounter  Medications  . ergocalciferol (VITAMIN D2) 1.25 MG (50000 UT) capsule    Sig: Take 1 capsule (50,000 Units total) by mouth 2 (two) times a week. X 6 weeks.    Dispense:  12 capsule    Refill:  0    Fill one day early if pharmacy is closed on scheduled refill date. May substitute for generic, or similar, if available.  . Cholecalciferol (VITAMIN D3) 125 MCG (5000 UT) CAPS    Sig: Take 1 capsule (5,000 Units total) by mouth daily with breakfast. Take along with calcium and magnesium.    Dispense:  30 capsule    Refill:  2    Fill 1 day early if pharmacy is closed on scheduled refill date. Generic permitted. Do not send renewal requests.  . DISCONTD: iohexol (OMNIPAQUE) 180 MG/ML injection 10 mL    Must be Myelogram-compatible. If not available, you may substitute with a water-soluble, non-ionic, hypoallergenic, myelogram-compatible radiological contrast medium.  Marland Kitchen lidocaine (XYLOCAINE) 2 % (with pres) injection 400 mg  . DISCONTD: midazolam (VERSED) 5 MG/5ML injection 1-2 mg    Make  sure Flumazenil is available in the pyxis when using this medication. If oversedation occurs, administer 0.2 mg IV over 15 sec. If after 45 sec no response, administer 0.2 mg again over 1 min; may repeat at 1 min intervals; not to exceed 4 doses (1 mg)  . DISCONTD: fentaNYL (SUBLIMAZE) injection 25-50 mcg    Make sure Narcan is available in the pyxis when using this medication. In the event of respiratory depression (RR< 8/min): Titrate NARCAN (naloxone) in increments of 0.1 to 0.2 mg IV at 2-3 minute intervals, until desired degree of reversal.  . sodium chloride flush (NS) 0.9 % injection 1 mL  . ropivacaine (PF) 2 mg/mL (0.2%) (NAROPIN) injection 1 mL  . dexamethasone (DECADRON) injection 10 mg   Medications administered: We administered lidocaine, sodium chloride flush, ropivacaine (PF) 2 mg/mL (0.2%), and dexamethasone.  See the medical record for exact dosing, route, and time of administration.  Follow-up plan:   Return in about 2 weeks (around 06/15/2020) for (F2F), (PP) Follow-up.      Interventional Therapies  Risk  Complexity Considerations:   WNL   Planned  Pending:   Pending further evaluation   Under consideration:   Diagnostic left C7-T1 cervical ESI #2  Diagnostic left cervical facet block #1    Completed:   None at this time   Therapeutic  Palliative (PRN) options:   None established    Recent Visits Date Type Provider Dept  05/24/20 Office Visit Milinda Pointer, MD [facility]  Showing recent visits within past 90 days and meeting all other requirements Today's Visits Date Type Provider Dept  06/01/20 Procedure visit Milinda Pointer, MD [facility]  Showing today's visits and meeting all other requirements Future Appointments Date Type Provider Dept  06/19/20 Appointment Milinda Pointer, MD [facility]  Showing future appointments within next 90 days and meeting all other requirements  Disposition: Discharge home  Discharge (Date  Time): 06/01/2020; 1040 hrs.   Primary Care Physician: [name], MD Location: [facility] Note by: Gaspar Cola, MD Date: 06/01/2020; Time: 12:55 PM  Disclaimer:  Medicine is not an Chief Strategy Officer. The only guarantee in medicine is that nothing is guaranteed. It is important to note that the decision to proceed with this intervention was based on the information collected from the patient. The Data and conclusions were drawn from the patient's questionnaire, the interview, and the physical examination. Because the information was provided in large part by the patient, it cannot be guaranteed that it has not been purposely or unconsciously manipulated. Every effort has been made to obtain as much relevant data as possible for this evaluation. It is important to note that the conclusions that lead to this procedure are derived in large part from the available data. Always take into account that the treatment will also be dependent on availability of resources and existing treatment guidelines, considered by other Pain Management Practitioners as being common knowledge and practice, at the time of the intervention. For Medico-Legal purposes, it is also important to point out that variation in procedural techniques and pharmacological choices are the acceptable norm. The indications, contraindications, technique, and results of the above procedure should only be interpreted and judged by a Board-Certified Interventional Pain Specialist with extensive familiarity and expertise in the same exact procedure and technique.

## 2020-06-02 ENCOUNTER — Other Ambulatory Visit
Admission: RE | Admit: 2020-06-02 | Discharge: 2020-06-02 | Disposition: A | Discharge status: Home / Self Care | Payer: Managed Care, Other (non HMO) | Source: Ambulatory Visit | Attending: Gastroenterology | Admitting: Gastroenterology

## 2020-06-02 ENCOUNTER — Telehealth: Payer: Self-pay | Admitting: *Deleted

## 2020-06-02 DIAGNOSIS — Z01812 Encounter for preprocedural laboratory examination: Secondary | ICD-10-CM | POA: Diagnosis not present

## 2020-06-02 DIAGNOSIS — Z20822 Contact with and (suspected) exposure to covid-19: Secondary | ICD-10-CM | POA: Diagnosis not present

## 2020-06-02 LAB — SARS CORONAVIRUS 2 (TAT 6-24 HRS): SARS Coronavirus 2: NEGATIVE

## 2020-06-02 NOTE — Telephone Encounter (Signed)
No problems post procedure. 

## 2020-06-06 ENCOUNTER — Ambulatory Visit: Payer: Managed Care, Other (non HMO) | Admitting: Anesthesiology

## 2020-06-06 ENCOUNTER — Encounter
Admission: RE | Disposition: A | Discharge status: Home / Self Care | Payer: Self-pay | Source: Home / Self Care | Attending: Gastroenterology

## 2020-06-06 ENCOUNTER — Ambulatory Visit
Admission: RE | Admit: 2020-06-06 | Discharge: 2020-06-06 | Disposition: A | Discharge status: Home / Self Care | Payer: Managed Care, Other (non HMO) | Attending: Gastroenterology | Admitting: Gastroenterology

## 2020-06-06 ENCOUNTER — Encounter: Payer: Self-pay | Admitting: Gastroenterology

## 2020-06-06 DIAGNOSIS — K2270 Barrett's esophagus without dysplasia: Secondary | ICD-10-CM | POA: Diagnosis not present

## 2020-06-06 DIAGNOSIS — K317 Polyp of stomach and duodenum: Secondary | ICD-10-CM | POA: Diagnosis not present

## 2020-06-06 DIAGNOSIS — Z79899 Other long term (current) drug therapy: Secondary | ICD-10-CM | POA: Insufficient documentation

## 2020-06-06 DIAGNOSIS — Z794 Long term (current) use of insulin: Secondary | ICD-10-CM | POA: Insufficient documentation

## 2020-06-06 DIAGNOSIS — Z888 Allergy status to other drugs, medicaments and biological substances status: Secondary | ICD-10-CM | POA: Diagnosis not present

## 2020-06-06 DIAGNOSIS — Z87891 Personal history of nicotine dependence: Secondary | ICD-10-CM | POA: Insufficient documentation

## 2020-06-06 DIAGNOSIS — K22710 Barrett's esophagus with low grade dysplasia: Secondary | ICD-10-CM | POA: Diagnosis not present

## 2020-06-06 DIAGNOSIS — Z7982 Long term (current) use of aspirin: Secondary | ICD-10-CM | POA: Diagnosis not present

## 2020-06-06 DIAGNOSIS — Z7951 Long term (current) use of inhaled steroids: Secondary | ICD-10-CM | POA: Insufficient documentation

## 2020-06-06 DIAGNOSIS — K219 Gastro-esophageal reflux disease without esophagitis: Secondary | ICD-10-CM

## 2020-06-06 HISTORY — PX: ESOPHAGOGASTRODUODENOSCOPY (EGD) WITH PROPOFOL: SHX5813

## 2020-06-06 LAB — BASIC METABOLIC PANEL
Anion gap: 11 (ref 5–15)
BUN: 48 mg/dL — ABNORMAL HIGH (ref 8–23)
CO2: 25 mmol/L (ref 22–32)
Calcium: 8.8 mg/dL — ABNORMAL LOW (ref 8.9–10.3)
Chloride: 94 mmol/L — ABNORMAL LOW (ref 98–111)
Creatinine, Ser: 2.4 mg/dL — ABNORMAL HIGH (ref 0.61–1.24)
GFR, Estimated: 30 mL/min — ABNORMAL LOW (ref >60)
Glucose, Bld: 586 mg/dL (ref 70–99)
Potassium: 4.1 mmol/L (ref 3.5–5.1)
Sodium: 130 mmol/L — ABNORMAL LOW (ref 135–145)

## 2020-06-06 LAB — GLUCOSE, CAPILLARY
Glucose-Capillary: 549 mg/dL (ref 70–99)
Glucose-Capillary: 476 mg/dL — ABNORMAL HIGH (ref 70–99)

## 2020-06-06 SURGERY — ESOPHAGOGASTRODUODENOSCOPY (EGD) WITH PROPOFOL
Anesthesia: General

## 2020-06-06 MED ORDER — LIDOCAINE HCL (PF) 2 % IJ SOLN
INTRAMUSCULAR | Status: AC
  Filled 2020-06-06: qty 5

## 2020-06-06 MED ORDER — PROPOFOL 500 MG/50ML IV EMUL
INTRAVENOUS | Status: DC | PRN
  Administered 2020-06-06: 150 ug/kg/min via INTRAVENOUS

## 2020-06-06 MED ORDER — INSULIN ASPART 100 UNIT/ML ~~LOC~~ SOLN
12.0000 [IU] | SUBCUTANEOUS | Status: AC
  Administered 2020-06-06: 12 [IU] via SUBCUTANEOUS

## 2020-06-06 MED ORDER — PROPOFOL 10 MG/ML IV BOLUS
INTRAVENOUS | Status: AC
  Filled 2020-06-06: qty 20

## 2020-06-06 MED ORDER — ONDANSETRON HCL 4 MG/2ML IJ SOLN
INTRAMUSCULAR | Status: AC
  Filled 2020-06-06: qty 2

## 2020-06-06 MED ORDER — SODIUM CHLORIDE 0.9 % IV SOLN: INTRAVENOUS | Status: DC

## 2020-06-06 MED ORDER — ONDANSETRON HCL 4 MG/2ML IJ SOLN
INTRAMUSCULAR | Status: DC | PRN
  Administered 2020-06-06: 4 mg via INTRAVENOUS

## 2020-06-06 MED ORDER — INSULIN ASPART 100 UNIT/ML ~~LOC~~ SOLN
SUBCUTANEOUS | Status: AC
  Filled 2020-06-06: qty 1

## 2020-06-06 MED ORDER — LIDOCAINE HCL (CARDIAC) PF 100 MG/5ML IV SOSY
PREFILLED_SYRINGE | INTRAVENOUS | Status: DC | PRN
  Administered 2020-06-06: 60 mg via INTRAVENOUS

## 2020-06-06 NOTE — OR Nursing (Signed)
2nd page out to DR Ketchum. CALLED FLOW MANAGER OFFICE WHO STATED SHE PAGED MD 91 MINUTES AGO. CRITICAL BLOOD SUGAR [phone] . FLO OFFICE INSTRUCTED TO RE-PAGE PRIME DOC AGAIN TO ENDOSCOPY [facility] NOW. DR Ronelle Nigh INFORMED

## 2020-06-06 NOTE — Transfer of Care (Signed)
Immediate Anesthesia Transfer of Care Note  Patient: [name] [name]  Procedure(s) Performed: ESOPHAGOGASTRODUODENOSCOPY (EGD) WITH PROPOFOL (N/A )  Patient Location: PACU  Anesthesia Type:MAC and General  Level of Consciousness: awake, alert  and oriented  Airway & Oxygen Therapy: Patient Spontanous Breathing  Post-op Assessment: Report given to RN and Post -op Vital signs reviewed and stable  Post vital signs: stable  Last Vitals:  Vitals Value Taken Time  BP    Temp    Pulse    Resp    SpO2      Last Pain:  Vitals:   06/06/20 0848  TempSrc: Temporal  PainSc: 5       Patients Stated Pain Goal: 1 (96/22/29 7989)  Complications: No complications documented.

## 2020-06-06 NOTE — Op Note (Signed)
[facility] Patient Name: [name] [name] Procedure Date: 06/05/2020 11:38 AM MRN: [id number] Account #: [id number] Date of Birth: Jul 30, 1960 Admit Type: Outpatient Age: 60 Room: [facility] ENDO ROOM 4 Gender: Male Note Status: Finalized Procedure:             Upper GI endoscopy Indications:           Barrett's low grade dysplasia Providers:             Lucilla Lame MD, MD Medicines:             Propofol per Anesthesia Complications:         No immediate complications. Procedure:             Pre-Anesthesia Assessment:                        - Prior to the procedure, a History and Physical was                         performed, and patient medications and allergies were                         reviewed. The patient's tolerance of previous                         anesthesia was also reviewed. The risks and benefits                         of the procedure and the sedation options and risks                         were discussed with the patient. All questions were                         answered, and informed consent was obtained. Prior                         Anticoagulants: The patient has taken no previous                         anticoagulant or antiplatelet agents. ASA Grade                         Assessment: II - A patient with mild systemic disease.                         After reviewing the risks and benefits, the patient                         was deemed in satisfactory condition to undergo the                         procedure.                        After obtaining informed consent, the endoscope was                         passed under direct vision. Throughout the procedure,  the patient's blood pressure, pulse, and oxygen                         saturations were monitored continuously. The Endoscope                         was introduced through the mouth, and advanced to the                         second part of  duodenum. The upper GI endoscopy was                         accomplished without difficulty. The patient tolerated                         the procedure well. Findings:      A single polyp with no bleeding was found at the gastroesophageal       junction. The polyp was removed with a hot snare. Resection and       retrieval were complete.      The stomach was normal.      The examined duodenum was normal. Impression:            - Gastroesophageal junction polyp(s) were found.                         Resected and retrieved.                        - Normal stomach.                        - Normal examined duodenum. Recommendation:        - Discharge patient to home.                        - Resume previous diet.                        - Continue present medications.                        - Await pathology results. Procedure Code(s):     --- Professional ---                        [phone], Esophagogastroduodenoscopy, flexible,                         transoral; with removal of tumor(s), polyp(s), or                         other lesion(s) by snare technique Diagnosis Code(s):     --- Professional ---                        K22.710, Barrett's esophagus with low grade dysplasia                        K31.7, Polyp of stomach and duodenum CPT copyright 2019 American Medical Association. All rights reserved. The codes documented in this report are preliminary and upon coder review may  be revised to meet current compliance requirements. Lucilla Lame MD, MD 06/06/2020 12:04:01 PM This report has been signed electronically. Number of Addenda: 0 Note Initiated On: 06/05/2020 11:38 AM Estimated Blood Loss:  Estimated blood loss: none.      [facility]

## 2020-06-06 NOTE — H&P (Signed)
Lucilla Lame, MD [address] [place], [place] [address] Phone:[phone] Fax : [phone]  Primary Care Physician:  [name], MD Primary Gastroenterologist:  Dr. Allen Norris  Pre-Procedure History & Physical: HPI:  [name] [name] is a 60 y.o. male is here for an endoscopy.   Past Medical History:  Diagnosis Date  . Arrhythmia   . Arthritis   . CAD (coronary artery disease)    a. 12/2016 s/p CABG x 4 (LIMA->LAD, VG->RCA, VG->OM1, VG->D1); b. 02/2018 MV: EF 35%, small-med inferolaterlal infarct. No ischemia.  . Colon polyps   . Diabetes ([place])   . Dupre's syndrome   . GERD (gastroesophageal reflux disease)   . HFimpEF (heart failure with improved ejection fraction) ([place])    a. 2018 EF 35%; b. 02/2018 EF 50%; c. 02/2019 EF 40-45%; d. 08/2019 Echo: EF 50-55%, no rwma, Gr2 DD. Nl RV size/fxn. Triv MR. Mild Ao sclerosis.  . Hyperlipidemia   . Hypertension   . Ischemic cardiomyopathy    a. 02/2018 MV: EF 35%; b. 08/2019 Echo: EF 50-55%.  . Lymphedema   . Migraine   . Neuropathy   . PAD (peripheral artery disease) ([place])    a. 04/2017 Aortoiliac duplex: R iliac dzs.  . Peripheral vascular disease ([place])   . Retinopathy   . Sleep apnea   . Stage 3 chronic kidney disease ([place])   . Stomach ulcer     Past Surgical History:  Procedure Laterality Date  . bone graft surgery    . CARDIAC CATHETERIZATION  2013   S/p PCI   . CARDIAC CATHETERIZATION  2018   S/p CABG  . CARDIAC SURGERY    . CORONARY ARTERY BYPASS GRAFT  2018   (LIMA-LAD,VG-RCA,VG-OM1,VG-D1)  . ESOPHAGOGASTRODUODENOSCOPY (EGD) WITH PROPOFOL N/A 04/04/2020   Procedure: ESOPHAGOGASTRODUODENOSCOPY (EGD) WITH PROPOFOL;  Surgeon: Lucilla Lame, MD;  Location: [facility] ENDOSCOPY;  Service: Endoscopy;  Laterality: N/A;  . EYE SURGERY    . PERIPHERAL VASCULAR CATHETERIZATION Right 10/28/2016   PTA/DEB Right SFA  . WRIST SURGERY      Prior to Admission medications   Medication Sig Start Date End Date Taking?  Authorizing Provider  Alirocumab (PRALUENT) 75 MG/ML SOAJ Inject 75 mg into the skin every 14 (fourteen) days. 10/13/19  Yes Minna Merritts, MD  aspirin EC 81 MG tablet Take 81 mg by mouth daily. Swallow whole.   Yes [provider]  carvedilol (COREG) 12.5 MG tablet Take 12.5 mg by mouth 2 (two) times daily with a meal.   Yes [provider]  Cholecalciferol (VITAMIN D3) 125 MCG (5000 UT) CAPS Take 1 capsule (5,000 Units total) by mouth daily with breakfast. Take along with calcium and magnesium. 06/01/20 08/30/20 Yes Milinda Pointer, MD  cholecalciferol (VITAMIN D3) 25 MCG (1000 UNIT) tablet Take 2,000 Units by mouth daily.   Yes [provider]  diclofenac Sodium (VOLTAREN) 1 % GEL Apply 2 g topically in the morning and at bedtime. To the neck muscle 03/07/20  Yes [name], MD  ergocalciferol (VITAMIN D2) 1.25 MG (50000 UT) capsule Take 1 capsule (50,000 Units total) by mouth 2 (two) times a week. X 6 weeks. 06/01/20 07/13/20 Yes Milinda Pointer, MD  ezetimibe (ZETIA) 10 MG tablet TAKE 1 TABLET BY MOUTH EVERY DAY 01/03/20  Yes [name], MD  fluticasone furoate-vilanterol (BREO ELLIPTA) 100-25 MCG/INH AEPB Inhale 1 puff into the lungs daily.   Yes [provider]  HYDROcodone-acetaminophen (NORCO) 7.5-325 MG tablet Take 1 tablet  by mouth as needed for moderate pain.   Yes [provider]  insulin degludec (TRESIBA FLEXTOUCH) 200 UNIT/ML FlexTouch Pen Inject 140 Units into the skin daily. 05/01/20  Yes Boscia, Heather E, NP  insulin lispro (HUMALOG) 100 UNIT/ML KwikPen Inject into the skin. 0-20 units per sliding scale   Yes [provider]  Insulin Pen Needle (B-D ULTRAFINE III SHORT PEN) 31G X 8 MM MISC To use with pen basal and mealtime coverage insulin pens 5 times daily. DX: E11.65 04/17/20  Yes Ronnell Freshwater, NP  metoCLOPramide (REGLAN) 5 MG tablet One tab before each meal and one at bed time for dyspepsia prn 01/19/20  Yes [name], MD  metolazone (ZAROXOLYN) 5 MG tablet Take 5 mg by mouth 2 (two) times a week. As needed for leg swelling   Yes [provider]  pantoprazole (PROTONIX) 40 MG tablet Take 1 tablet (40 mg total) by mouth 2 (two) times daily before a meal. 05/02/20 07/31/20 Yes Lazar Tierce, MD  torsemide (DEMADEX) 20 MG tablet Take 2 tablets (40 mg) by mouth daily in the morning   Yes [provider]  albuterol (ACCUNEB) 0.63 MG/3ML nebulizer solution Inhale into the lungs.    [provider]    Allergies as of 05/02/2020 - Review Complete 05/02/2020  Allergen Reaction Noted  . Other Other (See Comments) 05/19/2019  . Statins  05/19/2019  . Atorvastatin Other (See Comments) 05/31/2019  . Entresto [sacubitril-valsartan]  12/02/2019  . Pravastatin Other (See Comments) 05/31/2019  . Pregabalin Other (See Comments) 05/31/2019  . Rosuvastatin Other (See Comments) 12/09/2012    Family History  Problem Relation Age of Onset  . Diabetes Mother   . Heart disease Father     Social History   Socioeconomic History  . Marital status: Married    Spouse name: Not on file  . Number of children: Not on file  . Years of education: Not on file  . Highest education level: Not on file  Occupational History  . Not on file  Tobacco Use  . Smoking status: Former Smoker    Types: Cigarettes    Quit date: 05/18/2012    Years since quitting: 8.0  . Smokeless tobacco: Never Used  Vaping Use  . Vaping Use: Never used  Substance and Sexual Activity  . Alcohol use: Yes    Comment: very rarely  . Drug use: Never  . Sexual activity: Not on file  Other Topics Concern  . Not on file  Social History Narrative  . Not on file   Social Determinants of Health   Financial Resource Strain: Not on file  Food Insecurity: Not on file  Transportation Needs: Not on file  Physical Activity: Not on file  Stress: Not on file  Social Connections: Not on file  Intimate Partner Violence: Not  on file    Review of Systems: See HPI, otherwise negative ROS  Physical Exam: BP 124/79   Pulse 66   Temp (!) 96.6 F (35.9 C) (Temporal)   Resp 18   Ht 6\' 2"  (1.88 m)   Wt 90.9 kg   SpO2 100%   BMI 25.73 kg/m  General:   Alert,  pleasant and cooperative in NAD Head:  Normocephalic and atraumatic. Neck:  Supple; no masses or thyromegaly. Lungs:  Clear throughout to auscultation.    Heart:  Regular rate and rhythm. Abdomen:  Soft, nontender and nondistended. Normal bowel sounds, without guarding, and without rebound.   Neurologic:  Alert and  oriented x4;  grossly normal neurologically.  Impression/Plan: [name] [name] is here for an endoscopy to be performed for possible barrett's with low grade dysplasia  Risks, benefits, limitations, and alternatives regarding  endoscopy have been reviewed with the patient.  Questions have been answered.  All parties agreeable.   Lucilla Lame, MD  06/06/2020, 8:54 AM

## 2020-06-06 NOTE — OR Nursing (Signed)
1125 FSBS  476 .Marland Kitchen DR [facility] NOTIFIED . PTM REPORTS FEELING BETTER

## 2020-06-06 NOTE — Anesthesia Postprocedure Evaluation (Signed)
Anesthesia Post Note  Patient: [name] [name]  Procedure(s) Performed: ESOPHAGOGASTRODUODENOSCOPY (EGD) WITH PROPOFOL (N/A )  Anesthesia Type: General Level of consciousness: awake Pain management: pain level controlled Vital Signs Assessment: post-procedure vital signs reviewed and stable Respiratory status: spontaneous breathing Cardiovascular status: blood pressure returned to baseline Postop Assessment: no headache Anesthetic complications: no   No complications documented.   Last Vitals:  Vitals:   06/06/20 0848 06/06/20 1206  BP: 124/79 125/70  Pulse: 66 (!) 58  Resp: 18 14  Temp: (!) 35.9 C   SpO2: 100% 98%    Last Pain:  Vitals:   06/06/20 1206  TempSrc:   PainSc: 0-No pain                 Dierdre Forth Teige Rountree

## 2020-06-06 NOTE — Anesthesia Preprocedure Evaluation (Signed)
Anesthesia Evaluation  Patient identified by MRN, date of birth, ID band Patient awake    Reviewed: Allergy & Precautions, NPO status , Patient's Chart, lab work & pertinent test results  History of Anesthesia Complications Negative for: history of anesthetic complications  Airway Mallampati: III       Dental  (+) Missing, Chipped   Pulmonary sleep apnea (not using CPAP) , COPD,  COPD inhaler, Not current smoker, former smoker,           Cardiovascular hypertension, Pt. on medications and Pt. on home beta blockers + Past MI and + CABG  (-) CHF (-) dysrhythmias (-) Valvular Problems/Murmurs     Neuro/Psych neg Seizures Anxiety Depression    GI/Hepatic Neg liver ROS, PUD, GERD  Medicated,  Endo/Other  diabetes, Type 2, Insulin Dependent  Renal/GU Renal InsufficiencyRenal disease     Musculoskeletal   Abdominal   Peds  Hematology   Anesthesia Other Findings   Reproductive/Obstetrics                             Anesthesia Physical Anesthesia Plan  ASA: III  Anesthesia Plan: General   Post-op Pain Management:    Induction: Intravenous  PONV Risk Score and Plan: 2 and Propofol infusion and TIVA  Airway Management Planned: Nasal Cannula  Additional Equipment:   Intra-op Plan:   Post-operative Plan:   Informed Consent: I have reviewed the patients History and Physical, chart, labs and discussed the procedure including the risks, benefits and alternatives for the proposed anesthesia with the patient or authorized representative who has indicated his/her understanding and acceptance.       Plan Discussed with:   Anesthesia Plan Comments:         Anesthesia Quick Evaluation

## 2020-06-07 ENCOUNTER — Encounter: Payer: Self-pay | Admitting: Gastroenterology

## 2020-06-07 LAB — SURGICAL PATHOLOGY

## 2020-06-09 ENCOUNTER — Telehealth: Payer: Self-pay

## 2020-06-09 NOTE — Telephone Encounter (Signed)
Pt notified of EGD results.  

## 2020-06-09 NOTE — Telephone Encounter (Signed)
-----   Message from Lucilla Lame, MD sent at 06/08/2020 11:38 AM EST ----- Let the patient know that the lesion removed from his esophagus did not show any cancer and he should have this repeated in 1 years time

## 2020-06-14 ENCOUNTER — Telehealth (INDEPENDENT_AMBULATORY_CARE_PROVIDER_SITE_OTHER): Payer: No Typology Code available for payment source | Admitting: Internal Medicine

## 2020-06-18 NOTE — Progress Notes (Signed)
PROVIDER NOTE: Information contained herein reflects review and annotations entered in association with encounter. Interpretation of such information and data should be left to medically-trained personnel. Information provided to patient can be located elsewhere in the medical record under "Patient Instructions". Document created using STT-dictation technology, any transcriptional errors that may result from process are unintentional.    Patient: [name] [name]  Service Category: E/M  Provider: Gaspar Cola, MD  DOB: Jan 23, 1961  DOS: 06/19/2020  Specialty: Interventional Pain Management  MRN: [id number]  Setting: Ambulatory outpatient  PCP: [name], MD  Type: Established Patient    Referring Provider: [name], MD  Location: Office  Delivery: Face-to-face     HPI  [name] [name], a 60 y.o. year old male, is here today because of his Chronic pain syndrome [G89.4]. [name] [name] primary complain today is Neck Pain Last encounter: My last encounter with him was on 06/01/2020. 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 Pain Assessment: Severity of Chronic pain is reported as a 0-No pain/10. Location: Neck Lower/Denies. Onset: More than a month ago. Quality:  .  Timing: Intermittent. Modifying factor(s): procedures. Vitals:  height is 6' 2"  (1.88 m) and weight is 203 lb (92.1 kg). His temperature is 97.4 F (36.3 C) (abnormal). His blood pressure is 149/71 (abnormal) and his pulse is 77. His oxygen saturation is 99%.   Reason for encounter: post-procedure assessment.  The patient returns today after his very first interventional therapy done on 06/01/2020 which consisted of a left cervical epidural steroid injection.  Post-Procedure Evaluation  Procedure (06/01/2020): Diagnostic/therapeutic left C7-T1 cervical ESI #1 under fluoroscopic guidance, no sedation. Pre-procedure pain level: 4/10 Post-procedure: 4/10 No initial benefit, possibly due to rapid discharge after no sedation procedure, without enough time to allow full onset of block.  Sedation: None.  Effectiveness during initial hour after procedure(Ultra-Short Term Relief): 100 %.  Local anesthetic used: Long-acting (4-6 hours) Effectiveness: Defined as any analgesic benefit obtained secondary to the administration of local anesthetics. This carries significant diagnostic value as to the etiological location, or anatomical origin, of the pain. Duration of benefit is expected to coincide with the duration of the local anesthetic used.  Effectiveness during initial 4-6 hours after procedure(Short-Term Relief): 100 %.  Long-term benefit: Defined as any relief past the pharmacologic duration of the local anesthetics.  Effectiveness past the initial 6 hours after procedure(Long-Term Relief): 100 %.  Current benefits: Defined as benefit that persist at this time.   Analgesia:  100% better.  The patient indicates currently having absolutely no pain. Function: [name] [name] reports improvement in function ROM: [name] [name] reports improvement in ROM  Pharmacotherapy Assessment   Analgesic: None MME/day: 0 mg/day   Monitoring: [place] PMP: PDMP reviewed during this encounter.       Pharmacotherapy: No  side-effects or adverse reactions reported. Compliance: No problems identified. Effectiveness: Clinically acceptable.  Chauncey Fischer, RN  06/19/2020 10:26 AM  Sign when Signing Visit Safety precautions to be maintained throughout the outpatient stay will include: orient to surroundings, keep bed in low position, maintain call bell within reach at all times, provide assistance with transfer out of bed and ambulation.     UDS: No results found for: SUMMARY   ROS  Constitutional: Denies any fever or chills Gastrointestinal: No reported hemesis, hematochezia, vomiting, or acute GI distress Musculoskeletal: Denies any acute onset joint swelling, redness, loss of ROM, or weakness Neurological: No reported episodes of acute onset apraxia, aphasia, dysarthria, agnosia, amnesia, paralysis, loss of coordination, or loss of consciousness  Medication Review  Alirocumab, HYDROcodone-acetaminophen, Insulin Pen Needle, Vitamin D3, albuterol, aspirin EC, carvedilol, cholecalciferol, diclofenac Sodium, ergocalciferol, ezetimibe, fluticasone furoate-vilanterol, insulin degludec, insulin lispro, metoCLOPramide, metolazone, pantoprazole, and torsemide  History Review  Allergy: [name] [name] is allergic to contrast media [iodinated diagnostic agents], other, statins, atorvastatin, entresto [sacubitril-valsartan], pravastatin, pregabalin, and rosuvastatin. Drug: [name] [name]  reports no history of drug use. Alcohol:  reports current alcohol use. Tobacco:  reports that he quit smoking about 8 years ago. His smoking use included cigarettes. He has never used smokeless tobacco. Social: [name] [name]  reports that he quit smoking about 8 years ago. His smoking use included cigarettes. He has never used smokeless tobacco. He reports current alcohol use. He reports that he does not use drugs. Medical:  has a past medical history of Arrhythmia, Arthritis, CAD (coronary artery disease), Colon polyps, Diabetes ([place]), Dupre's  syndrome, GERD (gastroesophageal reflux disease), HFimpEF (heart failure with improved ejection fraction) ([place]), Hyperlipidemia, Hypertension, Ischemic cardiomyopathy, Lymphedema, Migraine, Neuropathy, PAD (peripheral artery disease) ([place]), Peripheral vascular disease ([place]), Retinopathy, Sleep apnea, Stage 3 chronic kidney disease ([name]), and Stomach ulcer. Surgical: [name] [name]  has a past surgical history that includes Cardiac surgery; bone graft surgery; Eye surgery; Wrist surgery; Coronary artery bypass graft (2018); Cardiac catheterization (Right, 10/28/2016); Cardiac catheterization (2013); Cardiac catheterization (2018); Esophagogastroduodenoscopy (egd) with propofol (N/A, 04/04/2020); and Esophagogastroduodenoscopy (egd) with propofol (N/A, 06/06/2020). Family: family history includes Diabetes in his mother; Heart disease in his father.  Laboratory Chemistry Profile   Renal Lab Results  Component Value Date   BUN 48 (H) 06/06/2020   CREATININE 2.40 (H) 06/06/2020   BCR 16 05/24/2020   GFRAA 30 (L) 05/24/2020   GFRNONAA 30 (L) 06/06/2020     Hepatic Lab Results  Component Value Date   AST 19 05/24/2020   ALT 28 12/02/2019   ALBUMIN 3.7 (L) 05/24/2020   ALKPHOS 105 05/24/2020     Electrolytes Lab Results  Component Value Date   NA 130 (L) 06/06/2020   K 4.1 06/06/2020   CL 94 (L) 06/06/2020   CALCIUM 8.8 (L) 06/06/2020   MG 2.1 05/24/2020     Bone Lab Results  Component Value Date   25OHVITD1 12 (L) 05/24/2020   25OHVITD2 <1.0 05/24/2020   25OHVITD3 12 05/24/2020     Inflammation (CRP: Acute Phase) (ESR: Chronic Phase) Lab Results  Component Value Date   CRP 7 05/24/2020   ESRSEDRATE 72 (H) 05/24/2020       Note: Above Lab results reviewed.  Recent Imaging Review  DG PAIN CLINIC C-ARM 1-60 MIN NO REPORT Fluoro was used, but no Radiologist interpretation will be provided.  Please refer to "NOTES" tab for provider progress note. Note: Reviewed        Physical  Exam  General appearance: Well nourished, well developed, and well hydrated. In no apparent acute distress Mental status: Alert, oriented x 3 (  person, place, & time)       Respiratory: No evidence of acute respiratory distress Eyes: PERLA Vitals: BP (!) 149/71   Pulse 77   Temp (!) 97.4 F (36.3 C)   Ht 6' 2"  (1.88 m)   Wt 203 lb (92.1 kg)   SpO2 99%   BMI 26.06 kg/m  BMI: Estimated body mass index is 26.06 kg/m as calculated from the following:   Height as of this encounter: 6' 2"  (1.88 m).   Weight as of this encounter: 203 lb (92.1 kg). Ideal: Ideal body weight: 82.2 kg (181 lb 3.5 oz) Adjusted ideal body weight: 86.2 kg (189 lb 14.9 oz)  Assessment   Status Diagnosis  Controlled Resolved Stable 1. Chronic pain syndrome   2. Cervicalgia   3. DDD (degenerative disc disease), cervical   4. 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 Cervical Grade 1 Retrolisthesis of C4/C5 and C5/C6   6. Cervical facet syndrome (Left)   7. Elevated sed rate   8. Vitamin D deficiency      Updated Problems: Problem  Open Wound of Right Foot  Statin-Induced Myositis  Type 2 Diabetes Mellitus With Diabetic Polyneuropathy, With Long-Term Current Use of Insulin (Hcc)  Barrett's Esophagus With Low Grade Dysplasia  History of Pancreatitis  Chest Pain    Plan of Care  Problem-specific:  No problem-specific Assessment & Plan notes found for this encounter.  [name] [name] has a current medication list which includes the following long-term medication(s): carvedilol, ezetimibe, insulin lispro, metoclopramide, metolazone, and pantoprazole.  Pharmacotherapy (Medications Ordered): No orders of the defined types were placed in this encounter.  Orders:  Orders Placed This Encounter  Procedures  . Cervical Epidural Injection    Level(s): C7-T1 Laterality: Left-sided Purpose: Diagnostic/Therapeutic Indication(s): Radiculitis and cervicalgia associater with cervical  degenerative disc disease.    Standing Status:   Standing    Number of Occurrences:   1    Standing Expiration Date:   12/17/2020    Scheduling Instructions:     Procedure: Cervical Epidural Steroid Injection/Block     Sedation: Patient's choice.     Timeframe: PRN    Order Specific Question:   Where will this procedure be performed?    Answer:   [facility]    Comments:   by Dr. Dossie Arbour   Follow-up plan:   Return if symptoms worsen or fail to improve, for PRN Procedure: (L) CESI #2.      Interventional Therapies  Risk  Complexity Considerations:   WNL   Planned  Pending:   Pending further evaluation   Under consideration:   Diagnostic left C7-T1 cervical ESI #2  Diagnostic left cervical facet block #1    Completed:   None at this time   Therapeutic  Palliative (PRN) options:   None established    Recent Visits Date Type Provider Dept  06/01/20 Procedure visit Milinda Pointer, MD [facility]  05/24/20 Office Visit Milinda Pointer, MD [facility]  Showing recent visits within past 90 days and meeting all other requirements Today's Visits Date Type Provider Dept  06/19/20 Office Visit Milinda Pointer, MD [facility]  Showing today's visits and meeting all other requirements Future Appointments No visits were found meeting these conditions. Showing future appointments within next 90 days and meeting all other requirements  I discussed the assessment and treatment plan with the patient. The patient was provided an opportunity to ask questions and all were answered. The patient  agreed with the plan and demonstrated an understanding of the instructions.  Patient advised to call back or seek an in-person evaluation if the symptoms or condition worsens.  Duration of encounter: 20 minutes.  Note by: Gaspar Cola, MD Date: 06/19/2020; Time: 10:46 AM

## 2020-06-19 ENCOUNTER — Encounter: Payer: Self-pay | Admitting: Pain Medicine

## 2020-06-19 ENCOUNTER — Ambulatory Visit: Payer: Managed Care, Other (non HMO) | Attending: Pain Medicine | Admitting: Pain Medicine

## 2020-06-19 ENCOUNTER — Other Ambulatory Visit: Payer: Self-pay

## 2020-06-19 VITALS — BP 149/71 | HR 77 | Temp 97.4°F | Ht 74.0 in | Wt 203.0 lb

## 2020-06-19 DIAGNOSIS — M542 Cervicalgia: Secondary | ICD-10-CM

## 2020-06-19 DIAGNOSIS — E559 Vitamin D deficiency, unspecified: Secondary | ICD-10-CM | POA: Diagnosis present

## 2020-06-19 DIAGNOSIS — M5030 Other cervical disc degeneration, unspecified cervical region: Secondary | ICD-10-CM | POA: Diagnosis present

## 2020-06-19 DIAGNOSIS — M4310 Spondylolisthesis, site unspecified: Secondary | ICD-10-CM | POA: Diagnosis present

## 2020-06-19 DIAGNOSIS — G894 Chronic pain syndrome: Secondary | ICD-10-CM | POA: Diagnosis present

## 2020-06-19 DIAGNOSIS — M4802 Spinal stenosis, cervical region: Secondary | ICD-10-CM | POA: Diagnosis not present

## 2020-06-19 DIAGNOSIS — R700 Elevated erythrocyte sedimentation rate: Secondary | ICD-10-CM | POA: Diagnosis present

## 2020-06-19 DIAGNOSIS — M47812 Spondylosis without myelopathy or radiculopathy, cervical region: Secondary | ICD-10-CM | POA: Diagnosis present

## 2020-06-19 NOTE — Patient Instructions (Signed)
____________________________________________________________________________________________  Preparing for Procedure with Sedation  Procedure appointments are limited to planned procedures: . No Prescription Refills. . No disability issues will be discussed. . No medication changes will be discussed.  Instructions: . Oral Intake: Do not eat or drink anything for at least 8 hours prior to your procedure. (Exception: Blood Pressure Medication. See below.) . Transportation: Unless otherwise stated by your physician, you may drive yourself after the procedure. . Blood Pressure Medicine: Do not forget to take your blood pressure medicine with a sip of water the morning of the procedure. If your Diastolic (lower reading)is above 100 mmHg, elective cases will be cancelled/rescheduled. . Blood thinners: These will need to be stopped for procedures. Notify our staff if you are taking any blood thinners. Depending on which one you take, there will be specific instructions on how and when to stop it. . Diabetics on insulin: Notify the staff so that you can be scheduled 1st case in the morning. If your diabetes requires high dose insulin, take only  of your normal insulin dose the morning of the procedure and notify the staff that you have done so. . Preventing infections: Shower with an antibacterial soap the morning of your procedure. . Build-up your immune system: Take 1000 mg of Vitamin C with every meal (3 times a day) the day prior to your procedure. . Antibiotics: Inform the staff if you have a condition or reason that requires you to take antibiotics before dental procedures. . Pregnancy: If you are pregnant, call and cancel the procedure. . Sickness: If you have a cold, fever, or any active infections, call and cancel the procedure. . Arrival: You must be in the facility at least 30 minutes prior to your scheduled procedure. . Children: Do not bring children with you. . Dress appropriately:  Bring dark clothing that you would not mind if they get stained. . Valuables: Do not bring any jewelry or valuables.  Reasons to call and reschedule or cancel your procedure: (Following these recommendations will minimize the risk of a serious complication.) . Surgeries: Avoid having procedures within 2 weeks of any surgery. (Avoid for 2 weeks before or after any surgery). . Flu Shots: Avoid having procedures within 2 weeks of a flu shots or . (Avoid for 2 weeks before or after immunizations). . Barium: Avoid having a procedure within 7-10 days after having had a radiological study involving the use of radiological contrast. (Myelograms, Barium swallow or enema study). . Heart attacks: Avoid any elective procedures or surgeries for the initial 6 months after a "Myocardial Infarction" (Heart Attack). . Blood thinners: It is imperative that you stop these medications before procedures. Let us know if you if you take any blood thinner.  . Infection: Avoid procedures during or within two weeks of an infection (including chest colds or gastrointestinal problems). Symptoms associated with infections include: Localized redness, fever, chills, night sweats or profuse sweating, burning sensation when voiding, cough, congestion, stuffiness, runny nose, sore throat, diarrhea, nausea, vomiting, cold or Flu symptoms, recent or current infections. It is specially important if the infection is over the area that we intend to treat. . Heart and lung problems: Symptoms that may suggest an active cardiopulmonary problem include: cough, chest pain, breathing difficulties or shortness of breath, dizziness, ankle swelling, uncontrolled high or unusually low blood pressure, and/or palpitations. If you are experiencing any of these symptoms, cancel your procedure and contact your primary care physician for an evaluation.  Remember:  Regular Business hours are:    Monday to Thursday 8:00 AM to 4:00 PM  Provider's  Schedule: Kelcee Bjorn, MD:  Procedure days: Tuesday and Thursday 7:30 AM to 4:00 PM  Bilal Lateef, MD:  Procedure days: Monday and Wednesday 7:30 AM to 4:00 PM ____________________________________________________________________________________________   ____________________________________________________________________________________________  General Risks and Possible Complications  Patient Responsibilities: It is important that you read this as it is part of your informed consent. It is our duty to inform you of the risks and possible complications associated with treatments offered to you. It is your responsibility as a patient to read this and to ask questions about anything that is not clear or that you believe was not covered in this document.  Patient's Rights: You have the right to refuse treatment. You also have the right to change your mind, even after initially having agreed to have the treatment done. However, under this last option, if you wait until the last second to change your mind, you may be charged for the materials used up to that point.  Introduction: Medicine is not an exact science. Everything in Medicine, including the lack of treatment(s), carries the potential for danger, harm, or loss (which is by definition: Risk). In Medicine, a complication is a secondary problem, condition, or disease that can aggravate an already existing one. All treatments carry the risk of possible complications. The fact that a side effects or complications occurs, does not imply that the treatment was conducted incorrectly. It must be clearly understood that these can happen even when everything is done following the highest safety standards.  No treatment: You can choose not to proceed with the proposed treatment alternative. The "PRO(s)" would include: avoiding the risk of complications associated with the therapy. The "CON(s)" would include: not getting any of the treatment  benefits. These benefits fall under one of three categories: diagnostic; therapeutic; and/or palliative. Diagnostic benefits include: getting information which can ultimately lead to improvement of the disease or symptom(s). Therapeutic benefits are those associated with the successful treatment of the disease. Finally, palliative benefits are those related to the decrease of the primary symptoms, without necessarily curing the condition (example: decreasing the pain from a flare-up of a chronic condition, such as incurable terminal cancer).  General Risks and Complications: These are associated to most interventional treatments. They can occur alone, or in combination. They fall under one of the following six (6) categories: no benefit or worsening of symptoms; bleeding; infection; nerve damage; allergic reactions; and/or death. 1. No benefits or worsening of symptoms: In Medicine there are no guarantees, only probabilities. No healthcare provider can ever guarantee that a medical treatment will work, they can only state the probability that it may. Furthermore, there is always the possibility that the condition may worsen, either directly, or indirectly, as a consequence of the treatment. 2. Bleeding: This is more common if the patient is taking a blood thinner, either prescription or over the counter (example: Goody Powders, Fish oil, Aspirin, Garlic, etc.), or if suffering a condition associated with impaired coagulation (example: Hemophilia, cirrhosis of the liver, low platelet counts, etc.). However, even if you do not have one on these, it can still happen. If you have any of these conditions, or take one of these drugs, make sure to notify your treating physician. 3. Infection: This is more common in patients with a compromised immune system, either due to disease (example: diabetes, cancer, human immunodeficiency virus [HIV], etc.), or due to medications or treatments (example: therapies used to treat  cancer and   rheumatological diseases). However, even if you do not have one on these, it can still happen. If you have any of these conditions, or take one of these drugs, make sure to notify your treating physician. 4. Nerve Damage: This is more common when the treatment is an invasive one, but it can also happen with the use of medications, such as those used in the treatment of cancer. The damage can occur to small secondary nerves, or to large primary ones, such as those in the spinal cord and brain. This damage may be temporary or permanent and it may lead to impairments that can range from temporary numbness to permanent paralysis and/or brain death. 5. Allergic Reactions: Any time a substance or material comes in contact with our body, there is the possibility of an allergic reaction. These can range from a mild skin rash (contact dermatitis) to a severe systemic reaction (anaphylactic reaction), which can result in death. 6. Death: In general, any medical intervention can result in death, most of the time due to an unforeseen complication. ____________________________________________________________________________________________   

## 2020-06-19 NOTE — Progress Notes (Signed)
Safety precautions to be maintained throughout the outpatient stay will include: orient to surroundings, keep bed in low position, maintain call bell within reach at all times, provide assistance with transfer out of bed and ambulation.  

## 2020-07-18 ENCOUNTER — Ambulatory Visit: Payer: Managed Care, Other (non HMO) | Admitting: Internal Medicine

## 2020-07-20 ENCOUNTER — Other Ambulatory Visit: Payer: Self-pay

## 2020-07-20 ENCOUNTER — Ambulatory Visit: Payer: Managed Care, Other (non HMO) | Admitting: Physician Assistant

## 2020-07-20 ENCOUNTER — Encounter: Payer: Self-pay | Admitting: Physician Assistant

## 2020-07-20 DIAGNOSIS — E1122 Type 2 diabetes mellitus with diabetic chronic kidney disease: Secondary | ICD-10-CM

## 2020-07-20 DIAGNOSIS — I10 Essential (primary) hypertension: Secondary | ICD-10-CM | POA: Diagnosis not present

## 2020-07-20 DIAGNOSIS — Z794 Long term (current) use of insulin: Secondary | ICD-10-CM | POA: Diagnosis not present

## 2020-07-20 DIAGNOSIS — J449 Chronic obstructive pulmonary disease, unspecified: Secondary | ICD-10-CM

## 2020-07-20 DIAGNOSIS — E1140 Type 2 diabetes mellitus with diabetic neuropathy, unspecified: Secondary | ICD-10-CM | POA: Diagnosis not present

## 2020-07-20 DIAGNOSIS — I509 Heart failure, unspecified: Secondary | ICD-10-CM

## 2020-07-20 DIAGNOSIS — G894 Chronic pain syndrome: Secondary | ICD-10-CM

## 2020-07-20 DIAGNOSIS — N1832 Chronic kidney disease, stage 3b: Secondary | ICD-10-CM

## 2020-07-20 DIAGNOSIS — G4733 Obstructive sleep apnea (adult) (pediatric): Secondary | ICD-10-CM

## 2020-07-20 LAB — GLUCOSE, POCT (MANUAL RESULT ENTRY): POC Glucose: 196 mg/dl — AB (ref 70–99)

## 2020-07-20 MED ORDER — ENALAPRIL MALEATE 5 MG PO TABS: 5.0000 mg | ORAL_TABLET | Freq: Every day | ORAL | 2 refills | Status: DC

## 2020-07-20 NOTE — Progress Notes (Signed)
[facility] [place], [place] [address]  Internal MEDICINE  Office Visit Note  Patient Name: [name] [name]  [id number]  LC:[id number]  Date of Service: 07/21/2020  Chief Complaint  Patient presents with  . Diabetes    3 month f-up    HPI Pt is here for f/u for his DM. -He is currently taking tresiba 140 once per day and humalog 0-20 sliding scale. His BG at home varying greatly and are consistently elevated between 300-500. He has seen endocrinology in the past but states they never called for f/u, then he was supposed to go to [facility] and was cancelled for snow without any call for f/u. He is interested in going to alt endocrinology office. -BP at home: 105-145/68, been higher lately and was 201/84 at another office yesterdat. Currently on coreg and torsemide and metolazone for leg swelling as needed. -He is not using CPAP currently bc it doesn't work for him, he cant tolerate it and seemed to make things worse. He states they are looking into bipap as an alternative.   Current Medication: Outpatient Encounter Medications as of 07/20/2020  Medication Sig  . albuterol (ACCUNEB) 0.63 MG/3ML nebulizer solution Inhale into the lungs.  . Alirocumab (PRALUENT) 75 MG/ML SOAJ Inject 75 mg into the skin every 14 (fourteen) days.  Marland Kitchen aspirin EC 81 MG tablet Take 81 mg by mouth daily. Swallow whole.  . carvedilol (COREG) 12.5 MG tablet Take 12.5 mg by mouth 2 (two) times daily with a meal.  . Cholecalciferol (VITAMIN D3) 125 MCG (5000 UT) CAPS Take 1 capsule (5,000 Units total) by mouth daily with breakfast. Take along with calcium and magnesium.  . cholecalciferol (VITAMIN D3) 25 MCG (1000 UNIT) tablet Take 2,000 Units by mouth daily.  . diclofenac Sodium (VOLTAREN) 1 % GEL Apply 2 g topically in the morning and at bedtime. To the neck muscle  . enalapril (VASOTEC) 5 MG tablet Take 1 tablet (5 mg total) by mouth daily.  Marland Kitchen ezetimibe (ZETIA) 10 MG tablet TAKE 1 TABLET BY  MOUTH EVERY DAY  . fluticasone furoate-vilanterol (BREO ELLIPTA) 100-25 MCG/INH AEPB Inhale 1 puff into the lungs daily.  Marland Kitchen HYDROcodone-acetaminophen (NORCO) 7.5-325 MG tablet Take 1 tablet by mouth as needed for moderate pain.  Marland Kitchen insulin degludec (TRESIBA FLEXTOUCH) 200 UNIT/ML FlexTouch Pen Inject 140 Units into the skin daily.  . insulin lispro (HUMALOG) 100 UNIT/ML KwikPen Inject into the skin. 0-20 units per sliding scale  . Insulin Pen Needle (B-D ULTRAFINE III SHORT PEN) 31G X 8 MM MISC To use with pen basal and mealtime coverage insulin pens 5 times daily. DX: E11.65  . metoCLOPramide (REGLAN) 5 MG tablet One tab before each meal and one at bed time for dyspepsia prn  . metolazone (ZAROXOLYN) 5 MG tablet Take 5 mg by mouth 2 (two) times a week. As needed for leg swelling  . pantoprazole (PROTONIX) 40 MG tablet Take 1 tablet (40 mg total) by mouth 2 (two) times daily before a meal.  . torsemide (DEMADEX) 20 MG tablet Take 2 tablets (40 mg) by mouth daily in the morning   No facility-administered encounter medications on file as of 07/20/2020.    Surgical History: Past Surgical History:  Procedure Laterality Date  . bone graft surgery    . CARDIAC CATHETERIZATION  2013   S/p PCI   . CARDIAC CATHETERIZATION  2018   S/p CABG  . CARDIAC SURGERY    . CORONARY ARTERY BYPASS GRAFT  2018   (  LIMA-LAD,VG-RCA,VG-OM1,VG-D1)  . ESOPHAGOGASTRODUODENOSCOPY (EGD) WITH PROPOFOL N/A 04/04/2020   Procedure: ESOPHAGOGASTRODUODENOSCOPY (EGD) WITH PROPOFOL;  Surgeon: Lucilla Lame, MD;  Location: [facility] ENDOSCOPY;  Service: Endoscopy;  Laterality: N/A;  . ESOPHAGOGASTRODUODENOSCOPY (EGD) WITH PROPOFOL N/A 06/06/2020   Procedure: ESOPHAGOGASTRODUODENOSCOPY (EGD) WITH PROPOFOL;  Surgeon: Lucilla Lame, MD;  Location: [facility] ENDOSCOPY;  Service: Endoscopy;  Laterality: N/A;  . EYE SURGERY    . PERIPHERAL VASCULAR CATHETERIZATION Right 10/28/2016   PTA/DEB Right SFA  . WRIST SURGERY      Medical  History: Past Medical History:  Diagnosis Date  . Arrhythmia   . Arthritis   . CAD (coronary artery disease)    a. 12/2016 s/p CABG x 4 (LIMA->LAD, VG->RCA, VG->OM1, VG->D1); b. 02/2018 MV: EF 35%, small-med inferolaterlal infarct. No ischemia.  . Colon polyps   . Diabetes ([place])   . Dupre's syndrome   . GERD (gastroesophageal reflux disease)   . HFimpEF (heart failure with improved ejection fraction) ([place])    a. 2018 EF 35%; b. 02/2018 EF 50%; c. 02/2019 EF 40-45%; d. 08/2019 Echo: EF 50-55%, no rwma, Gr2 DD. Nl RV size/fxn. Triv MR. Mild Ao sclerosis.  . Hyperlipidemia   . Hypertension   . Ischemic cardiomyopathy    a. 02/2018 MV: EF 35%; b. 08/2019 Echo: EF 50-55%.  . Lymphedema   . Migraine   . Neuropathy   . PAD (peripheral artery disease) ([place])    a. 04/2017 Aortoiliac duplex: R iliac dzs.  . Peripheral vascular disease ([place])   . Retinopathy   . Sleep apnea   . Stage 3 chronic kidney disease ([place])   . Stomach ulcer     Family History: Family History  Problem Relation Age of Onset  . Diabetes Mother   . Heart disease Father     Social History   Socioeconomic History  . Marital status: Married    Spouse name: Not on file  . Number of children: Not on file  . Years of education: Not on file  . Highest education level: Not on file  Occupational History  . Not on file  Tobacco Use  . Smoking status: Former Smoker    Types: Cigarettes    Quit date: 05/18/2012    Years since quitting: 8.1  . Smokeless tobacco: Never Used  Vaping Use  . Vaping Use: Never used  Substance and Sexual Activity  . Alcohol use: Yes    Comment: very rarely  . Drug use: Never  . Sexual activity: Not on file  Other Topics Concern  . Not on file  Social History Narrative  . Not on file   Social Determinants of Health   Financial Resource Strain: Not on file  Food Insecurity: Not on file  Transportation Needs: Not on file  Physical Activity: Not on file  Stress: Not on file   Social Connections: Not on file  Intimate Partner Violence: Not on file      Review of Systems  Constitutional: Positive for fatigue. Negative for chills and unexpected weight change.  HENT: Negative for congestion, postnasal drip, rhinorrhea, sneezing and sore throat.   Eyes: Negative for redness.  Respiratory: Positive for apnea. Negative for cough, chest tightness and shortness of breath.   Cardiovascular: Negative for chest pain and palpitations.  Gastrointestinal: Negative for abdominal pain, constipation, diarrhea, nausea and vomiting.  Genitourinary: Negative for dysuria and frequency.  Musculoskeletal: Positive for arthralgias, back pain, gait problem, myalgias and neck pain. Negative for joint swelling.  Skin: Negative for rash.  Neurological: Positive for weakness, numbness and headaches. Negative for tremors.  Hematological: Negative for adenopathy. Does not bruise/bleed easily.  Psychiatric/Behavioral: Negative for behavioral problems (Depression) and suicidal ideas. The patient is not nervous/anxious.    Depression screen [facility] 2/9 07/20/2020 05/24/2020 03/20/2020 03/07/2020 02/18/2020  Decreased Interest 0 0 3 0 1  Down, Depressed, Hopeless 0 0 1 0 1  PHQ - 2 Score 0 0 4 0 2  Altered sleeping - - 1 - 3  Tired, decreased energy - - 1 - 3  Change in appetite - - 0 - 0  Feeling bad or failure about yourself  - - 0 - 0  Trouble concentrating - - 0 - 1  Moving slowly or fidgety/restless - - 0 - 0  Suicidal thoughts - - 0 - 0  PHQ-9 Score - - 6 - 9    Vital Signs: BP (!) 160/84   Pulse 80   Temp 97.9 F (36.6 C)   Resp 16   Ht '6\' 2"'$  (1.88 m)   Wt 214 lb 6.4 oz (97.3 kg)   SpO2 99%   BMI 27.53 kg/m    Physical Exam Constitutional:      General: He is not in acute distress.    Appearance: He is well-developed. He is not diaphoretic.  HENT:     Head: Normocephalic and atraumatic.     Mouth/Throat:     Pharynx: No oropharyngeal exudate.  Eyes:     Pupils:  Pupils are equal, round, and reactive to light.  Neck:     Thyroid: No thyromegaly.     Vascular: No JVD.     Trachea: No tracheal deviation.  Cardiovascular:     Rate and Rhythm: Normal rate and regular rhythm.     Heart sounds: Normal heart sounds. No murmur heard. No friction rub. No gallop.   Pulmonary:     Effort: Pulmonary effort is normal. No respiratory distress.     Breath sounds: No wheezing or rales.  Chest:     Chest wall: No tenderness.  Abdominal:     General: Bowel sounds are normal.     Palpations: Abdomen is soft.  Musculoskeletal:        General: Normal range of motion.     Cervical back: Normal range of motion and neck supple.     Comments: Chronic pain with movement  Lymphadenopathy:     Cervical: No cervical adenopathy.  Skin:    General: Skin is warm and dry.  Neurological:     Mental Status: He is alert and oriented to person, place, and time.     Cranial Nerves: No cranial nerve deficit.     Motor: Weakness present.     Gait: Gait abnormal.     Comments: Walks with a cane  Psychiatric:        Behavior: Behavior normal.        Thought Content: Thought content normal.        Judgment: Judgment normal.        Assessment/Plan: 1. Type 2 diabetes mellitus with diabetic neuropathy, with long-term current use of insulin (HCC) - POCT Glucose (CBG) is 196, A1c could not be done due to reading being too high. BG has never been well controlled despite extensive therapy. He will be referred to endocrinology for insulin pump and further management. - Ambulatory referral to Endocrinology 2. Essential hypertension BP elevated in office today, and per pt was very elevated at another office yesterday. Will add on  enalapril to give better BP coverage and due to DM. He will continue Coreg, torsemide, and metolazone as needed. - enalapril (VASOTEC) 5 MG tablet; Take 1 tablet (5 mg total) by mouth daily.  Dispense: 30 tablet; Refill: 2  3. Stage 3b chronic kidney  disease ([place]) Followed by nephrology.  4. Chronic congestive heart failure, unspecified heart failure type ([place]) Followed by cardiology. Recent echo from 05/17/20 showed EF of 30-35% with LVH and left atrial enlargement. Mild MR. IVC dilated.  5. Chronic obstructive pulmonary disease, unspecified COPD type ([place]) Continue inhalers as prescribed.  6. OSA (obstructive sleep apnea) Not on CPAP, looking into Bipap.  7. Chronic pain syndrome Followed by pain management.   General Counseling: nameer hagle understanding of the findings of todays visit and agrees with plan of treatment. I have discussed any further diagnostic evaluation that may be needed or ordered today. We also reviewed his medications today. he has been encouraged to call the office with any questions or concerns that should arise related to todays visit.    Orders Placed This Encounter  Procedures  . Ambulatory referral to Endocrinology  . POCT Glucose (CBG)    Meds ordered this encounter  Medications  . enalapril (VASOTEC) 5 MG tablet    Sig: Take 1 tablet (5 mg total) by mouth daily.    Dispense:  30 tablet    Refill:  2    This patient was seen by Drema Dallas, PA-C in collaboration with Dr. [name] as a part of collaborative care agreement.   Total time spent:30 Minutes Time spent includes review of chart, medications, test results, and follow up plan with the patient.      Dr [name] Internal medicine

## 2020-07-31 ENCOUNTER — Telehealth: Payer: Self-pay

## 2020-07-31 NOTE — Telephone Encounter (Signed)
Faxed office note to dr Ronnald Collum

## 2020-08-14 ENCOUNTER — Ambulatory Visit: Payer: Managed Care, Other (non HMO) | Admitting: Physician Assistant

## 2020-08-24 ENCOUNTER — Other Ambulatory Visit: Payer: Self-pay | Admitting: Cardiovascular Disease

## 2020-08-29 ENCOUNTER — Emergency Department
Admission: EM | Admit: 2020-08-29 | Discharge: 2020-08-29 | Disposition: A | Discharge status: Home / Self Care | Payer: Managed Care, Other (non HMO) | Attending: Emergency Medicine | Admitting: Emergency Medicine

## 2020-08-29 ENCOUNTER — Other Ambulatory Visit: Payer: Self-pay

## 2020-08-29 ENCOUNTER — Ambulatory Visit: Payer: Managed Care, Other (non HMO) | Admitting: *Deleted

## 2020-08-29 DIAGNOSIS — J449 Chronic obstructive pulmonary disease, unspecified: Secondary | ICD-10-CM | POA: Insufficient documentation

## 2020-08-29 DIAGNOSIS — E1122 Type 2 diabetes mellitus with diabetic chronic kidney disease: Secondary | ICD-10-CM | POA: Diagnosis not present

## 2020-08-29 DIAGNOSIS — N184 Chronic kidney disease, stage 4 (severe): Secondary | ICD-10-CM | POA: Diagnosis not present

## 2020-08-29 DIAGNOSIS — Z7951 Long term (current) use of inhaled steroids: Secondary | ICD-10-CM | POA: Insufficient documentation

## 2020-08-29 DIAGNOSIS — Z794 Long term (current) use of insulin: Secondary | ICD-10-CM | POA: Diagnosis not present

## 2020-08-29 DIAGNOSIS — N390 Urinary tract infection, site not specified: Secondary | ICD-10-CM | POA: Insufficient documentation

## 2020-08-29 DIAGNOSIS — I130 Hypertensive heart and chronic kidney disease with heart failure and stage 1 through stage 4 chronic kidney disease, or unspecified chronic kidney disease: Secondary | ICD-10-CM | POA: Insufficient documentation

## 2020-08-29 DIAGNOSIS — I2510 Atherosclerotic heart disease of native coronary artery without angina pectoris: Secondary | ICD-10-CM | POA: Diagnosis not present

## 2020-08-29 DIAGNOSIS — R531 Weakness: Secondary | ICD-10-CM | POA: Insufficient documentation

## 2020-08-29 DIAGNOSIS — Z87891 Personal history of nicotine dependence: Secondary | ICD-10-CM | POA: Insufficient documentation

## 2020-08-29 DIAGNOSIS — Z7982 Long term (current) use of aspirin: Secondary | ICD-10-CM | POA: Insufficient documentation

## 2020-08-29 DIAGNOSIS — Z951 Presence of aortocoronary bypass graft: Secondary | ICD-10-CM | POA: Insufficient documentation

## 2020-08-29 DIAGNOSIS — E1143 Type 2 diabetes mellitus with diabetic autonomic (poly)neuropathy: Secondary | ICD-10-CM | POA: Insufficient documentation

## 2020-08-29 DIAGNOSIS — R5382 Chronic fatigue, unspecified: Secondary | ICD-10-CM | POA: Diagnosis not present

## 2020-08-29 DIAGNOSIS — Z79899 Other long term (current) drug therapy: Secondary | ICD-10-CM | POA: Diagnosis not present

## 2020-08-29 DIAGNOSIS — E11319 Type 2 diabetes mellitus with unspecified diabetic retinopathy without macular edema: Secondary | ICD-10-CM | POA: Diagnosis not present

## 2020-08-29 DIAGNOSIS — E860 Dehydration: Secondary | ICD-10-CM | POA: Diagnosis present

## 2020-08-29 DIAGNOSIS — I509 Heart failure, unspecified: Secondary | ICD-10-CM | POA: Insufficient documentation

## 2020-08-29 LAB — CBC
HCT: 37.1 % — ABNORMAL LOW (ref 39.0–52.0)
Hemoglobin: 13.1 g/dL (ref 13.0–17.0)
MCH: 31.7 pg (ref 26.0–34.0)
MCHC: 35.3 g/dL (ref 30.0–36.0)
MCV: 89.8 fL (ref 80.0–100.0)
Platelets: 245 10*3/uL (ref 150–400)
RBC: 4.13 MIL/uL — ABNORMAL LOW (ref 4.22–5.81)
RDW: 14.5 % (ref 11.5–15.5)
WBC: 18.3 10*3/uL — ABNORMAL HIGH (ref 4.0–10.5)
nRBC: 0 % (ref 0.0–0.2)

## 2020-08-29 LAB — URINALYSIS, COMPLETE (UACMP) WITH MICROSCOPIC
Bilirubin Urine: NEGATIVE
Glucose, UA: >=500 mg/dL — AB
Ketones, ur: NEGATIVE mg/dL
Nitrite: NEGATIVE
Protein, ur: >=300 mg/dL — AB
Specific Gravity, Urine: 1.018 (ref 1.005–1.030)
WBC, UA: >50 WBC/hpf — ABNORMAL HIGH (ref 0–5)
pH: 6 (ref 5.0–8.0)

## 2020-08-29 LAB — BASIC METABOLIC PANEL
Anion gap: 11 (ref 5–15)
BUN: 42 mg/dL — ABNORMAL HIGH (ref 8–23)
CO2: 23 mmol/L (ref 22–32)
Calcium: 8.6 mg/dL — ABNORMAL LOW (ref 8.9–10.3)
Chloride: 97 mmol/L — ABNORMAL LOW (ref 98–111)
Creatinine, Ser: 2.5 mg/dL — ABNORMAL HIGH (ref 0.61–1.24)
GFR, Estimated: 29 mL/min — ABNORMAL LOW (ref >60)
Glucose, Bld: 287 mg/dL — ABNORMAL HIGH (ref 70–99)
Potassium: 4.4 mmol/L (ref 3.5–5.1)
Sodium: 131 mmol/L — ABNORMAL LOW (ref 135–145)

## 2020-08-29 MED ORDER — HYDROMORPHONE HCL 1 MG/ML IJ SOLN
0.5000 mg | Freq: Once | INTRAMUSCULAR | Status: AC
  Administered 2020-08-29: 0.5 mg via INTRAVENOUS
  Filled 2020-08-29: qty 1

## 2020-08-29 MED ORDER — OXYCODONE-ACETAMINOPHEN 5-325 MG PO TABS: 1.0000 | ORAL_TABLET | Freq: Three times a day (TID) | ORAL | 0 refills | Status: DC | PRN

## 2020-08-29 MED ORDER — SODIUM CHLORIDE 0.9 % IV SOLN
1.0000 g | Freq: Once | INTRAVENOUS | Status: AC
  Administered 2020-08-29: 1 g via INTRAVENOUS
  Filled 2020-08-29: qty 10

## 2020-08-29 MED ORDER — CEPHALEXIN 500 MG PO CAPS: 500.0000 mg | ORAL_CAPSULE | Freq: Two times a day (BID) | ORAL | 0 refills | Status: AC

## 2020-08-29 MED ORDER — SODIUM CHLORIDE 0.9 % IV SOLN: 1000.0000 mL | Freq: Once | INTRAVENOUS | Status: DC

## 2020-08-29 MED ORDER — ONDANSETRON HCL 4 MG/2ML IJ SOLN
4.0000 mg | Freq: Once | INTRAMUSCULAR | Status: AC
  Administered 2020-08-29: 4 mg via INTRAVENOUS
  Filled 2020-08-29: qty 2

## 2020-08-29 MED ORDER — SODIUM CHLORIDE 0.9 % IV SOLN
1000.0000 mL | Freq: Once | INTRAVENOUS | Status: AC
  Administered 2020-08-29: 1000 mL via INTRAVENOUS

## 2020-08-29 MED ORDER — MORPHINE SULFATE (PF) 2 MG/ML IV SOLN
2.0000 mg | Freq: Once | INTRAVENOUS | Status: AC
  Administered 2020-08-29: 2 mg via INTRAVENOUS
  Filled 2020-08-29: qty 1

## 2020-08-29 NOTE — ED Provider Notes (Signed)
[facility] Emergency Department Provider Note   ____________________________________________    I have reviewed the triage vital signs and the nursing notes.   HISTORY  Chief Complaint Dehydration     HPI [name] [name] is a 60 y.o. male who presents with complaints of dehydration.  Patient reports he has developed nausea and fatigue and weakness.  He reports this is fairly common for him and he often has this which requires IV fluids.  He does have poorly controlled diabetes as well.  He also reports he is having dysuria and that his neuropathy appears to be flaring up.  He has tried Lyrica and gabapentin unsuccessfully.  Denies fevers or chills.  No abdominal pain.  No chest pain.  Past Medical History:  Diagnosis Date  . Arrhythmia   . Arthritis   . CAD (coronary artery disease)    a. 12/2016 s/p CABG x 4 (LIMA->LAD, VG->RCA, VG->OM1, VG->D1); b. 02/2018 MV: EF 35%, small-med inferolaterlal infarct. No ischemia.  . Colon polyps   . Diabetes ([place])   . Dupre's syndrome   . GERD (gastroesophageal reflux disease)   . HFimpEF (heart failure with improved ejection fraction) ([place])    a. 2018 EF 35%; b. 02/2018 EF 50%; c. 02/2019 EF 40-45%; d. 08/2019 Echo: EF 50-55%, no rwma, Gr2 DD. Nl RV size/fxn. Triv MR. Mild Ao sclerosis.  . Hyperlipidemia   . Hypertension   . Ischemic cardiomyopathy    a. 02/2018 MV: EF 35%; b. 08/2019 Echo: EF 50-55%.  . Lymphedema   . Migraine   . Neuropathy   . PAD (peripheral artery disease) ([place])    a. 04/2017 Aortoiliac duplex: R iliac dzs.  . Peripheral vascular disease ([place])   . Retinopathy   . Sleep apnea   . Stage 3 chronic kidney disease ([place])   . Stomach ulcer     Patient Active Problem List   Diagnosis Date Noted  . Barrett's esophagus with low grade dysplasia   . Diabetes mellitus with stage 4 chronic kidney disease GFR 15-29 ([place]) 06/01/2020  . Serum albumin decreased 06/01/2020  . Vitamin D deficiency  06/01/2020  . Elevated hemoglobin A1c 06/01/2020  . Elevated sed rate 06/01/2020  . Elevated brain natriuretic peptide (BNP) level 06/01/2020  . Insulin dependent type 2 diabetes mellitus, uncontrolled ([place]) 06/01/2020  . Diabetes mellitus with complication, with long-term current use of insulin ([place]) 06/01/2020  . Chronic pain syndrome 05/24/2020  . Pharmacologic therapy 05/24/2020  . Disorder of skeletal system 05/24/2020  . Problems influencing health status 05/24/2020  . DDD (degenerative disc disease), cervical 05/24/2020  . 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 Cervical Grade 1 Retrolisthesis of C4/C5 and C5/C6 05/24/2020  . Chronic hand pain (Bilateral) 05/24/2020  . Chronic leg and foot pain (Bilateral) 05/24/2020  . Chronic lower extremity numbness from knee down (Bilateral) 05/24/2020  . Chronic greater occipital neuralgia (Bilateral) 05/24/2020  . Cervical facet syndrome (Left) 05/24/2020  . Cervical disc disease with myelopathy 04/26/2020  . Cervicalgia 04/09/2020  . Syncope and collapse 04/09/2020  . Esophageal dysphagia   . Gastroesophageal reflux disease with esophagitis without hemorrhage   . PAD (peripheral artery disease) ([place]) 09/27/2019  . Diabetes ([place]) 09/14/2019  . Swelling of limb 09/14/2019  . Lymphedema 09/14/2019  . Chronic obstructive pulmonary disease ([place]) 09/05/2019  . Shortness of breath 09/05/2019  . Chest pain 07/16/2018  . Essential hypertension 04/28/2018  . Open wound of right foot 01/28/2018  . History  of pancreatitis 11/16/2017  . Statin-induced myositis 11/16/2017  . Type 2 diabetes mellitus with diabetic polyneuropathy, with long-term current use of insulin ([place]) 11/16/2017  . Coronary atherosclerosis 11/15/2016  . Heel ulcer due to DM ([place]) 12/09/2012  . Septic olecranon bursitis 10/14/2011  . Cellulitis 10/06/2011  . Depression with anxiety 02/22/2011  . Diabetic peripheral neuropathy ([place]) 02/22/2011  .  Migraines 02/22/2011  . Other hyperlipidemia 02/22/2011  . Sleep apnea 11/22/2010  . Coronary artery disease with history of myocardial infarction without history of CABG 10/21/2009    Past Surgical History:  Procedure Laterality Date  . bone graft surgery    . CARDIAC CATHETERIZATION  2013   S/p PCI   . CARDIAC CATHETERIZATION  2018   S/p CABG  . CARDIAC SURGERY    . CORONARY ARTERY BYPASS GRAFT  2018   (LIMA-LAD,VG-RCA,VG-OM1,VG-D1)  . ESOPHAGOGASTRODUODENOSCOPY (EGD) WITH PROPOFOL N/A 04/04/2020   Procedure: ESOPHAGOGASTRODUODENOSCOPY (EGD) WITH PROPOFOL;  Surgeon: Lucilla Lame, MD;  [facility] ENDOSCOPY;  Service: Endoscopy;  Laterality: N/A;  . ESOPHAGOGASTRODUODENOSCOPY (EGD) WITH PROPOFOL N/A 06/06/2020   Procedure: ESOPHAGOGASTRODUODENOSCOPY (EGD) WITH PROPOFOL;  Surgeon: Lucilla Lame, MD;  Location: [facility] ENDOSCOPY;  Service: Endoscopy;  Laterality: N/A;  . EYE SURGERY    . PERIPHERAL VASCULAR CATHETERIZATION Right 10/28/2016   PTA/DEB Right SFA  . WRIST SURGERY      Prior to Admission medications   Medication Sig Start Date End Date Taking? Authorizing Provider  cephALEXin (KEFLEX) 500 MG capsule Take 1 capsule (500 mg total) by mouth 2 (two) times daily for 10 days. 08/29/20 09/08/20 Yes Lavonia Drafts, MD  oxyCODONE-acetaminophen (PERCOCET) 5-325 MG tablet Take 1-2 tablets by mouth every 8 (eight) hours as needed for severe pain. 08/29/20 08/29/21 Yes Lavonia Drafts, MD  albuterol (ACCUNEB) 0.63 MG/3ML nebulizer solution Inhale into the lungs.    [provider]  Alirocumab (PRALUENT) 75 MG/ML SOAJ Inject 75 mg into the skin every 14 (fourteen) days. 10/13/19   Minna Merritts, MD  aspirin EC 81 MG tablet Take 81 mg by mouth daily. Swallow whole.    [provider]  carvedilol (COREG) 12.5 MG tablet Take 12.5 mg by mouth 2 (two) times daily with a meal.    [provider]  Cholecalciferol (VITAMIN D3) 125 MCG (5000 UT) CAPS Take 1 capsule (5,000  Units total) by mouth daily with breakfast. Take along with calcium and magnesium. 06/01/20 08/30/20  Milinda Pointer, MD  cholecalciferol (VITAMIN D3) 25 MCG (1000 UNIT) tablet Take 2,000 Units by mouth daily.    [provider]  diclofenac Sodium (VOLTAREN) 1 % GEL Apply 2 g topically in the morning and at bedtime. To the neck muscle 03/07/20   [name], MD  enalapril (VASOTEC) 5 MG tablet Take 1 tablet (5 mg total) by mouth daily. 07/20/20   McDonough, Si Gaul, PA-C  ezetimibe (ZETIA) 10 MG tablet TAKE 1 TABLET BY MOUTH EVERY DAY 01/03/20   [name], MD  fluticasone furoate-vilanterol (BREO ELLIPTA) 100-25 MCG/INH AEPB Inhale 1 puff into the lungs daily.    [provider]  HYDROcodone-acetaminophen (NORCO) 7.5-325 MG tablet Take 1 tablet by mouth as needed for moderate pain.    [provider]  insulin degludec (TRESIBA FLEXTOUCH) 200 UNIT/ML FlexTouch Pen Inject 140 Units into the skin daily. 05/01/20   Ronnell Freshwater, NP  insulin lispro (HUMALOG) 100 UNIT/ML KwikPen Inject into the skin. 0-20 units per sliding scale    [provider]  Insulin  Pen Needle (B-D ULTRAFINE III SHORT PEN) 31G X 8 MM MISC To use with pen basal and mealtime coverage insulin pens 5 times daily. DX: E11.65 04/17/20   Ronnell Freshwater, NP  metoCLOPramide (REGLAN) 5 MG tablet One tab before each meal and one at bed time for dyspepsia prn 01/19/20   [name], MD  metolazone (ZAROXOLYN) 5 MG tablet Take 5 mg by mouth 2 (two) times a week. As needed for leg swelling    [provider]  pantoprazole (PROTONIX) 40 MG tablet Take 1 tablet (40 mg total) by mouth 2 (two) times daily before a meal. 05/02/20 07/31/20  Lucilla Lame, MD  torsemide (DEMADEX) 20 MG tablet TAKE 1 TABLET BY MOUTH TWICE DAILY 08/24/20   Minna Merritts, MD     Allergies Contrast media [iodinated diagnostic agents], Other, Statins, Atorvastatin, Entresto [sacubitril-valsartan], Pravastatin,  Pregabalin, and Rosuvastatin  Family History  Problem Relation Age of Onset  . Diabetes Mother   . Heart disease Father     Social History Social History   Tobacco Use  . Smoking status: Former Smoker    Types: Cigarettes    Quit date: 05/18/2012    Years since quitting: 8.2  . Smokeless tobacco: Never Used  Vaping Use  . Vaping Use: Never used  Substance Use Topics  . Alcohol use: Yes    Comment: very rarely  . Drug use: Never    Review of Systems  Constitutional: No fever/chills Eyes: No visual changes.  ENT: Feels dehydrated Cardiovascular: Denies chest pain. Respiratory: Denies shortness of breath. Gastrointestinal: As above.   Genitourinary: As above Musculoskeletal: Negative for back pain. Skin: Negative for rash. Neurological: Neuropathy, no weakness   ____________________________________________   PHYSICAL EXAM:  VITAL SIGNS: ED Triage Vitals  Enc Vitals Group     BP 08/29/20 1105 (!) 163/77     Pulse Rate 08/29/20 1105 95     Resp 08/29/20 1105 18     Temp 08/29/20 1105 99.2 F (37.3 C)     Temp Source 08/29/20 1105 Oral     SpO2 08/29/20 1105 98 %     Weight 08/29/20 1106 93 kg (205 lb)     Height 08/29/20 1106 1.88 m ('6\' 2"'$ )     Head Circumference --      Peak Flow --      Pain Score 08/29/20 1105 9     Pain Loc --      Pain Edu? --      Excl. in [place]? --     Constitutional: Alert and oriented.   Nose: No congestion/rhinnorhea. Mouth/Throat: Mucous membranes are moist.    Cardiovascular: Normal rate, regular rhythm. Grossly normal heart sounds.  Good peripheral circulation. Respiratory: Normal respiratory effort.  No retractions. Lungs CTAB. Gastrointestinal: Soft and nontender. No distention.  No CVA tenderness. Musculoskeletal: No lower extremity tenderness nor edema.  Warm and well perfused Neurologic:  Normal speech and language. No gross focal neurologic deficits are appreciated.  Skin:  Skin is warm, dry and intact. No rash  noted. Psychiatric: Mood and affect are normal. Speech and behavior are normal.  ____________________________________________   LABS (all labs ordered are listed, but only abnormal results are displayed)  Labs Reviewed  BASIC METABOLIC PANEL - Abnormal; Notable for the following components:      Result Value   Sodium 131 (*)    Chloride 97 (*)    Glucose, Bld 287 (*)    BUN 42 (*)  Creatinine, Ser 2.50 (*)    Calcium 8.6 (*)    GFR, Estimated 29 (*)    All other components within normal limits  CBC - Abnormal; Notable for the following components:   WBC 18.3 (*)    RBC 4.13 (*)    HCT 37.1 (*)    All other components within normal limits  URINALYSIS, COMPLETE (UACMP) WITH MICROSCOPIC - Abnormal; Notable for the following components:   Color, Urine YELLOW (*)    APPearance CLOUDY (*)    Glucose, UA >=500 (*)    Hgb urine dipstick MODERATE (*)    Protein, ur >=300 (*)    Leukocytes,Ua TRACE (*)    WBC, UA >50 (*)    Bacteria, UA MANY (*)    All other components within normal limits  URINE CULTURE   ____________________________________________  EKG  None ____________________________________________  RADIOLOGY  None ____________________________________________   PROCEDURES  Procedure(s) performed: No  Procedures   Critical Care performed: No ____________________________________________   INITIAL IMPRESSION / ASSESSMENT AND PLAN / ED COURSE  Pertinent labs & imaging results that were available during my care of the patient were reviewed by me and considered in my medical decision making (see chart for details).  Patient here with complaints of dehydration.  He does report nausea and vomiting.  BUN and creatinine are in line with prior levels, he does have a history of CHF and does take Lasix, will give 1 L of IV fluid given nausea vomiting and dehydration.  IV Zofran given as well.  Patient requesting something for pain,  Trial IV morphine with little  improvement, half milligram of IV Dilaudid did help his pain.  Patient felt much better after fluids and medication, appropriate for discharge with outpatient follow-up.    ____________________________________________   FINAL CLINICAL IMPRESSION(S) / ED DIAGNOSES  Final diagnoses:  Dehydration  Lower urinary tract infection        Note:  This document was prepared using Dragon voice recognition software and may include unintentional dictation errors.   Lavonia Drafts, MD 08/29/20 [phone]

## 2020-08-29 NOTE — ED Notes (Signed)
ED Provider at bedside. 

## 2020-08-29 NOTE — ED Notes (Signed)
Patient discharged to home per MD order. Patient in stable condition, and deemed medically cleared by ED provider for discharge. Discharge instructions reviewed with patient/family using "Teach Back"; verbalized understanding of medication education and administration, and information about follow-up care. Denies further concerns. ° °

## 2020-08-29 NOTE — ED Notes (Signed)
Wife at bedside.

## 2020-08-29 NOTE — ED Notes (Signed)
Report to [facility], [place]

## 2020-08-29 NOTE — ED Triage Notes (Signed)
BIB [facility] from home due to possible dehydration and neuropathy. 240CBG. Alert and oriented X4. VSS. Reports ongoing neuropathy pain in all extremities. Weakness and dizziness on standing.
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
## 2020-08-31 ENCOUNTER — Emergency Department: Payer: Managed Care, Other (non HMO)

## 2020-08-31 ENCOUNTER — Other Ambulatory Visit: Payer: Self-pay

## 2020-08-31 ENCOUNTER — Observation Stay
Admission: EM | Admit: 2020-08-31 | Discharge: 2020-09-01 | Disposition: A | Discharge status: Home / Self Care | Payer: Managed Care, Other (non HMO) | Attending: Internal Medicine | Admitting: Internal Medicine

## 2020-08-31 DIAGNOSIS — Z79899 Other long term (current) drug therapy: Secondary | ICD-10-CM | POA: Insufficient documentation

## 2020-08-31 DIAGNOSIS — K22710 Barrett's esophagus with low grade dysplasia: Secondary | ICD-10-CM | POA: Diagnosis present

## 2020-08-31 DIAGNOSIS — Z794 Long term (current) use of insulin: Secondary | ICD-10-CM

## 2020-08-31 DIAGNOSIS — N390 Urinary tract infection, site not specified: Principal | ICD-10-CM | POA: Diagnosis present

## 2020-08-31 DIAGNOSIS — G894 Chronic pain syndrome: Secondary | ICD-10-CM | POA: Diagnosis present

## 2020-08-31 DIAGNOSIS — E876 Hypokalemia: Secondary | ICD-10-CM | POA: Insufficient documentation

## 2020-08-31 DIAGNOSIS — J449 Chronic obstructive pulmonary disease, unspecified: Secondary | ICD-10-CM | POA: Diagnosis not present

## 2020-08-31 DIAGNOSIS — M79672 Pain in left foot: Secondary | ICD-10-CM | POA: Diagnosis present

## 2020-08-31 DIAGNOSIS — E1142 Type 2 diabetes mellitus with diabetic polyneuropathy: Secondary | ICD-10-CM | POA: Diagnosis present

## 2020-08-31 DIAGNOSIS — I739 Peripheral vascular disease, unspecified: Secondary | ICD-10-CM | POA: Diagnosis present

## 2020-08-31 DIAGNOSIS — R509 Fever, unspecified: Secondary | ICD-10-CM

## 2020-08-31 DIAGNOSIS — Z951 Presence of aortocoronary bypass graft: Secondary | ICD-10-CM | POA: Insufficient documentation

## 2020-08-31 DIAGNOSIS — I5030 Unspecified diastolic (congestive) heart failure: Secondary | ICD-10-CM | POA: Diagnosis not present

## 2020-08-31 DIAGNOSIS — M79604 Pain in right leg: Secondary | ICD-10-CM | POA: Diagnosis present

## 2020-08-31 DIAGNOSIS — E1122 Type 2 diabetes mellitus with diabetic chronic kidney disease: Secondary | ICD-10-CM | POA: Diagnosis present

## 2020-08-31 DIAGNOSIS — I2510 Atherosclerotic heart disease of native coronary artery without angina pectoris: Secondary | ICD-10-CM | POA: Diagnosis not present

## 2020-08-31 DIAGNOSIS — R5381 Other malaise: Secondary | ICD-10-CM | POA: Diagnosis present

## 2020-08-31 DIAGNOSIS — E119 Type 2 diabetes mellitus without complications: Secondary | ICD-10-CM

## 2020-08-31 DIAGNOSIS — E7849 Other hyperlipidemia: Secondary | ICD-10-CM | POA: Diagnosis present

## 2020-08-31 DIAGNOSIS — E860 Dehydration: Secondary | ICD-10-CM | POA: Insufficient documentation

## 2020-08-31 DIAGNOSIS — F418 Other specified anxiety disorders: Secondary | ICD-10-CM | POA: Diagnosis present

## 2020-08-31 DIAGNOSIS — Z20822 Contact with and (suspected) exposure to covid-19: Secondary | ICD-10-CM | POA: Diagnosis not present

## 2020-08-31 DIAGNOSIS — Z87891 Personal history of nicotine dependence: Secondary | ICD-10-CM | POA: Diagnosis not present

## 2020-08-31 DIAGNOSIS — R112 Nausea with vomiting, unspecified: Secondary | ICD-10-CM

## 2020-08-31 DIAGNOSIS — I10 Essential (primary) hypertension: Secondary | ICD-10-CM | POA: Diagnosis present

## 2020-08-31 DIAGNOSIS — Z7982 Long term (current) use of aspirin: Secondary | ICD-10-CM | POA: Insufficient documentation

## 2020-08-31 DIAGNOSIS — M5000 Cervical disc disorder with myelopathy, unspecified cervical region: Secondary | ICD-10-CM | POA: Diagnosis present

## 2020-08-31 DIAGNOSIS — N184 Chronic kidney disease, stage 4 (severe): Secondary | ICD-10-CM | POA: Diagnosis not present

## 2020-08-31 DIAGNOSIS — E1140 Type 2 diabetes mellitus with diabetic neuropathy, unspecified: Secondary | ICD-10-CM

## 2020-08-31 DIAGNOSIS — E118 Type 2 diabetes mellitus with unspecified complications: Secondary | ICD-10-CM

## 2020-08-31 DIAGNOSIS — I252 Old myocardial infarction: Secondary | ICD-10-CM

## 2020-08-31 DIAGNOSIS — G4730 Sleep apnea, unspecified: Secondary | ICD-10-CM | POA: Diagnosis present

## 2020-08-31 LAB — URINALYSIS, COMPLETE (UACMP) WITH MICROSCOPIC
Bilirubin Urine: NEGATIVE
Glucose, UA: >=500 mg/dL — AB
Ketones, ur: 5 mg/dL — AB
Leukocytes,Ua: NEGATIVE
Nitrite: NEGATIVE
Protein, ur: >=300 mg/dL — AB
Specific Gravity, Urine: 1.012 (ref 1.005–1.030)
pH: 7 (ref 5.0–8.0)

## 2020-08-31 LAB — CBC WITH DIFFERENTIAL/PLATELET
Abs Immature Granulocytes: 0.05 10*3/uL (ref 0.00–0.07)
Basophils Absolute: 0.1 10*3/uL (ref 0.0–0.1)
Basophils Relative: 1 %
Eosinophils Absolute: 0.1 10*3/uL (ref 0.0–0.5)
Eosinophils Relative: 1 %
HCT: 36.7 % — ABNORMAL LOW (ref 39.0–52.0)
Hemoglobin: 12.5 g/dL — ABNORMAL LOW (ref 13.0–17.0)
Immature Granulocytes: 1 %
Lymphocytes Relative: 15 %
Lymphs Abs: 1.1 10*3/uL (ref 0.7–4.0)
MCH: 30.9 pg (ref 26.0–34.0)
MCHC: 34.1 g/dL (ref 30.0–36.0)
MCV: 90.8 fL (ref 80.0–100.0)
Monocytes Absolute: 0.6 10*3/uL (ref 0.1–1.0)
Monocytes Relative: 9 %
Neutro Abs: 5.2 10*3/uL (ref 1.7–7.7)
Neutrophils Relative %: 73 %
Platelets: 225 10*3/uL (ref 150–400)
RBC: 4.04 MIL/uL — ABNORMAL LOW (ref 4.22–5.81)
RDW: 14.4 % (ref 11.5–15.5)
WBC: 7.1 10*3/uL (ref 4.0–10.5)
nRBC: 0 % (ref 0.0–0.2)

## 2020-08-31 LAB — RESP PANEL BY RT-PCR (FLU A&B, COVID) ARPGX2
Influenza A by PCR: NEGATIVE
Influenza B by PCR: NEGATIVE
SARS Coronavirus 2 by RT PCR: NEGATIVE

## 2020-08-31 LAB — COMPREHENSIVE METABOLIC PANEL
ALT: 34 U/L (ref 0–44)
AST: 39 U/L (ref 15–41)
Albumin: 3.1 g/dL — ABNORMAL LOW (ref 3.5–5.0)
Alkaline Phosphatase: 87 U/L (ref 38–126)
Anion gap: 12 (ref 5–15)
BUN: 39 mg/dL — ABNORMAL HIGH (ref 8–23)
CO2: 25 mmol/L (ref 22–32)
Calcium: 8.7 mg/dL — ABNORMAL LOW (ref 8.9–10.3)
Chloride: 97 mmol/L — ABNORMAL LOW (ref 98–111)
Creatinine, Ser: 2.82 mg/dL — ABNORMAL HIGH (ref 0.61–1.24)
GFR, Estimated: 25 mL/min — ABNORMAL LOW (ref >60)
Glucose, Bld: 241 mg/dL — ABNORMAL HIGH (ref 70–99)
Potassium: 3.6 mmol/L (ref 3.5–5.1)
Sodium: 134 mmol/L — ABNORMAL LOW (ref 135–145)
Total Bilirubin: 0.5 mg/dL (ref 0.3–1.2)
Total Protein: 7.2 g/dL (ref 6.5–8.1)

## 2020-08-31 LAB — LACTIC ACID, PLASMA
Lactic Acid, Venous: 0.9 mmol/L (ref 0.5–1.9)
Lactic Acid, Venous: 1.4 mmol/L (ref 0.5–1.9)

## 2020-08-31 LAB — GLUCOSE, CAPILLARY: Glucose-Capillary: 267 mg/dL — ABNORMAL HIGH (ref 70–99)

## 2020-08-31 MED ORDER — ACETAMINOPHEN 325 MG PO TABS
650.0000 mg | ORAL_TABLET | Freq: Four times a day (QID) | ORAL | Status: DC | PRN
  Administered 2020-09-01: 650 mg via ORAL
  Filled 2020-08-31: qty 2

## 2020-08-31 MED ORDER — SODIUM CHLORIDE 0.9% FLUSH
3.0000 mL | Freq: Two times a day (BID) | INTRAVENOUS | Status: DC
  Administered 2020-09-01: 3 mL via INTRAVENOUS

## 2020-08-31 MED ORDER — PANTOPRAZOLE SODIUM 40 MG PO TBEC
40.0000 mg | DELAYED_RELEASE_TABLET | Freq: Two times a day (BID) | ORAL | Status: DC
  Administered 2020-09-01: 40 mg via ORAL
  Filled 2020-08-31: qty 1

## 2020-08-31 MED ORDER — ASPIRIN EC 81 MG PO TBEC
81.0000 mg | DELAYED_RELEASE_TABLET | Freq: Every day | ORAL | Status: DC
  Administered 2020-09-01: 81 mg via ORAL
  Filled 2020-08-31: qty 1

## 2020-08-31 MED ORDER — ENOXAPARIN SODIUM 40 MG/0.4ML ~~LOC~~ SOLN
40.0000 mg | SUBCUTANEOUS | Status: DC
  Administered 2020-08-31: 40 mg via SUBCUTANEOUS
  Filled 2020-08-31: qty 0.4

## 2020-08-31 MED ORDER — INSULIN GLARGINE 100 UNIT/ML ~~LOC~~ SOLN
50.0000 [IU] | Freq: Two times a day (BID) | SUBCUTANEOUS | Status: DC
  Administered 2020-08-31 – 2020-09-01 (×2): 50 [IU] via SUBCUTANEOUS
  Filled 2020-08-31 (×4): qty 0.5

## 2020-08-31 MED ORDER — LACTATED RINGERS IV BOLUS
1000.0000 mL | Freq: Once | INTRAVENOUS | Status: AC
  Administered 2020-08-31: 1000 mL via INTRAVENOUS

## 2020-08-31 MED ORDER — ALBUTEROL SULFATE (2.5 MG/3ML) 0.083% IN NEBU: 0.6300 mg | INHALATION_SOLUTION | Freq: Three times a day (TID) | RESPIRATORY_TRACT | Status: DC | PRN

## 2020-08-31 MED ORDER — SODIUM CHLORIDE 0.9 % IV SOLN
2.0000 g | Freq: Two times a day (BID) | INTRAVENOUS | Status: DC
  Administered 2020-09-01: 2 g via INTRAVENOUS
  Filled 2020-08-31 (×2): qty 2

## 2020-08-31 MED ORDER — ACETAMINOPHEN 650 MG RE SUPP: 650.0000 mg | Freq: Four times a day (QID) | RECTAL | Status: DC | PRN

## 2020-08-31 MED ORDER — FLUTICASONE FUROATE-VILANTEROL 100-25 MCG/INH IN AEPB
1.0000 | INHALATION_SPRAY | Freq: Every day | RESPIRATORY_TRACT | Status: DC
  Administered 2020-09-01: 1 via RESPIRATORY_TRACT
  Filled 2020-08-31: qty 28

## 2020-08-31 MED ORDER — INSULIN ASPART 100 UNIT/ML ~~LOC~~ SOLN
0.0000 [IU] | Freq: Three times a day (TID) | SUBCUTANEOUS | Status: DC
  Administered 2020-09-01: 3 [IU] via SUBCUTANEOUS
  Administered 2020-09-01: 8 [IU] via SUBCUTANEOUS
  Filled 2020-08-31 (×3): qty 1

## 2020-08-31 MED ORDER — EZETIMIBE 10 MG PO TABS
10.0000 mg | ORAL_TABLET | Freq: Every day | ORAL | Status: DC
  Administered 2020-09-01: 10 mg via ORAL
  Filled 2020-08-31: qty 1

## 2020-08-31 MED ORDER — CARVEDILOL 6.25 MG PO TABS
12.5000 mg | ORAL_TABLET | Freq: Two times a day (BID) | ORAL | Status: DC
  Administered 2020-09-01: 12.5 mg via ORAL
  Filled 2020-08-31: qty 2

## 2020-08-31 MED ORDER — HYDROCODONE-ACETAMINOPHEN 7.5-325 MG PO TABS: 1.0000 | ORAL_TABLET | Freq: Two times a day (BID) | ORAL | Status: DC | PRN

## 2020-08-31 MED ORDER — SODIUM CHLORIDE 0.9 % IV SOLN
2.0000 g | Freq: Once | INTRAVENOUS | Status: AC
  Administered 2020-08-31: 2 g via INTRAVENOUS
  Filled 2020-08-31: qty 2

## 2020-08-31 MED ORDER — POLYETHYLENE GLYCOL 3350 17 G PO PACK: 17.0000 g | PACK | Freq: Every day | ORAL | Status: DC | PRN

## 2020-08-31 NOTE — H&P (Addendum)
History and Physical   [name] [name] OVZ:[id number] DOB: 10-27-1960 DOA: 08/31/2020  PCP: [name], MD   Patient coming from: Home  Chief Complaint: Fatigue, nausea, vomiting, malaise, UTI  HPI: [name] [name] is a 60 y.o. male with medical history significant of GERD/Barrett's esophagus, cervical disc and spine disease, chronic pain, neuropathy, hyperlipidemia, CAD, PAD, depression, anxiety, diabetes, hypertension, migraines, CKD 4, sleep apnea, lymphedema who presents with continued fatigue and nausea with recent UTI diagnosis.  Patient was seen 2 days ago for similar symptoms of weakness and malaise that have been ongoing for about a week at that time.  This was something that reportedly occurs commonly for him.  He had reported fevers and chills at home prior to this subjectively but did not have any elevated temperatures.  He was evaluated in the ED given IV fluids for dehydration and also noted to have UTI.  He was given a dose of ceftriaxone and discharged on Keflex.  He was also given IV fluids.  His urine culture from that visit shows E. coli but sensitivities have not yet resulted.  Patient has continued to feel poorly for the past couple days and is concerned about dehydration once again and had presented for additional IV fluids.  Also reports headache and some dizziness in addition to his nausea vomiting fatigue.  He was noted to have fever in the ED with concern for failure of initial antibiotic therapy.  He does report some improvement in his urinary symptoms since beginning treatment.  He denies chest pain, shortness of breath, abdominal pain, constipation, diarrhea.  He states that he has recurrent episodes of fatigue malaise due to dehydration secondary to his difficult to control diabetes leading to glucose induced osmotic diuresis.  He states he frequently needs to get IV fluids for this.  He also states that he ends up getting frequent UTIs due to having high concentrations  for glucose in his urine.  ED Course: Vital signs in the ED significant for temperature to 101.5 and blood pressure in the 412I 786V systolic.  Lab work-up showed CMP with sodium 134 which corrects considering glucose of 241, chloride 97, BUN 39 and creatinine of 2.8 which is near his baseline considering his CKD 4.  Calcium 8.7 which corrects for albumin 3.1.  CBC no longer showing any signs of leukocytosis but does show hemoglobin of 12.5 near his baseline around 13.  Lactic acid normal on first check with repeat pending.  Respiratory panel for flu and COVID pending.  Urinalysis urine culture and blood culture also pending.  Patient was given a dose of cefepime as well as 3 L in the ED after initial concern was for sepsis.  Review of Systems: As per HPI otherwise all other systems reviewed and are negative.  Past Medical History:  Diagnosis Date  . Arrhythmia   . Arthritis   . CAD (coronary artery disease)    a. 12/2016 s/p CABG x 4 (LIMA->LAD, VG->RCA, VG->OM1, VG->D1); b. 02/2018 MV: EF 35%, small-med inferolaterlal infarct. No ischemia.  . Colon polyps   . Diabetes ([place])   . Dupre's syndrome   . GERD (gastroesophageal reflux disease)   . HFimpEF (heart failure with improved ejection fraction) ([place])    a. 2018 EF 35%; b. 02/2018 EF 50%; c. 02/2019 EF 40-45%; d. 08/2019 Echo: EF 50-55%, no rwma, Gr2 DD. Nl RV size/fxn. Triv MR. Mild Ao sclerosis.  . Hyperlipidemia   . Hypertension   . Ischemic cardiomyopathy    a.  02/2018 MV: EF 35%; b. 08/2019 Echo: EF 50-55%.  . Lymphedema   . Migraine   . Neuropathy   . PAD (peripheral artery disease) ([place])    a. 04/2017 Aortoiliac duplex: R iliac dzs.  . Peripheral vascular disease ([place])   . Retinopathy   . Sleep apnea   . Stage 3 chronic kidney disease ([place])   . Stomach ulcer     Past Surgical History:  Procedure Laterality Date  . bone graft surgery    . CARDIAC CATHETERIZATION  2013   S/p PCI   . CARDIAC CATHETERIZATION  2018   S/p CABG   . CARDIAC SURGERY    . CORONARY ARTERY BYPASS GRAFT  2018   (LIMA-LAD,VG-RCA,VG-OM1,VG-D1)  . ESOPHAGOGASTRODUODENOSCOPY (EGD) WITH PROPOFOL N/A 04/04/2020   Procedure: ESOPHAGOGASTRODUODENOSCOPY (EGD) WITH PROPOFOL;  Surgeon: Lucilla Lame, MD;  Location: [facility] ENDOSCOPY;  Service: Endoscopy;  Laterality: N/A;  . ESOPHAGOGASTRODUODENOSCOPY (EGD) WITH PROPOFOL N/A 06/06/2020   Procedure: ESOPHAGOGASTRODUODENOSCOPY (EGD) WITH PROPOFOL;  Surgeon: Lucilla Lame, MD;  Location: [facility] ENDOSCOPY;  Service: Endoscopy;  Laterality: N/A;  . EYE SURGERY    . PERIPHERAL VASCULAR CATHETERIZATION Right 10/28/2016   PTA/DEB Right SFA  . WRIST SURGERY      Social History  reports that he quit smoking about 8 years ago. His smoking use included cigarettes. He has never used smokeless tobacco. He reports current alcohol use. He reports that he does not use drugs.  Allergies  Allergen Reactions  . Contrast Media [Iodinated Diagnostic Agents]     Kidney disease   . Other Other (See Comments)    Pain  With joint stiffness    . Statins     Pain  With joint stiffness   . Atorvastatin Other (See Comments)    Muscle aches Muscle aches  . Entresto [Sacubitril-Valsartan]     Hyperkalemia   . Pravastatin Other (See Comments)    Muscle aches Muscle aches  . Pregabalin Other (See Comments)    Generalized aches and pains Generalized aches and pains  . Rosuvastatin Other (See Comments)    Muscle Aches Other reaction(s): JOINT PAIN Other reaction(s): JOINT PAIN Muscle Aches     Family History  Problem Relation Age of Onset  . Diabetes Mother   . Heart disease Father   Reviewed on admission  Prior to Admission medications   Medication Sig Start Date End Date Taking? Authorizing Provider  albuterol (ACCUNEB) 0.63 MG/3ML nebulizer solution Inhale into the lungs.    [provider]  Alirocumab (PRALUENT) 75 MG/ML SOAJ Inject 75 mg into the skin every 14 (fourteen) days. 10/13/19   Minna Merritts, MD  aspirin EC 81 MG tablet Take 81 mg by mouth daily. Swallow whole.    [provider]  carvedilol (COREG) 12.5 MG tablet Take 12.5 mg by mouth 2 (two) times daily with a meal.    [provider]  cephALEXin (KEFLEX) 500 MG capsule Take 1 capsule (500 mg total) by mouth 2 (two) times daily for 10 days. 08/29/20 09/08/20  Lavonia Drafts, MD  cholecalciferol (VITAMIN D3) 25 MCG (1000 UNIT) tablet Take 2,000 Units by mouth daily.    [provider]  diclofenac Sodium (VOLTAREN) 1 % GEL Apply 2 g topically in the morning and at bedtime. To the neck muscle 03/07/20   [name], MD  enalapril (VASOTEC) 5 MG tablet Take 1 tablet (5 mg total) by mouth daily. 07/20/20   McDonough, Si Gaul, PA-C  ezetimibe (ZETIA) 10 MG  tablet TAKE 1 TABLET BY MOUTH EVERY DAY 01/03/20   [name], MD  fluticasone furoate-vilanterol (BREO ELLIPTA) 100-25 MCG/INH AEPB Inhale 1 puff into the lungs daily.    [provider]  HYDROcodone-acetaminophen (NORCO) 7.5-325 MG tablet Take 1 tablet by mouth as needed for moderate pain.    [provider]  insulin degludec (TRESIBA FLEXTOUCH) 200 UNIT/ML FlexTouch Pen Inject 140 Units into the skin daily. 05/01/20   Ronnell Freshwater, NP  insulin lispro (HUMALOG) 100 UNIT/ML KwikPen Inject into the skin. 0-20 units per sliding scale    [provider]  Insulin Pen Needle (B-D ULTRAFINE III SHORT PEN) 31G X 8 MM MISC To use with pen basal and mealtime coverage insulin pens 5 times daily. DX: E11.65 04/17/20   Ronnell Freshwater, NP  metoCLOPramide (REGLAN) 5 MG tablet One tab before each meal and one at bed time for dyspepsia prn 01/19/20   [name], MD  metolazone (ZAROXOLYN) 5 MG tablet Take 5 mg by mouth 2 (two) times a week. As needed for leg swelling    [provider]  oxyCODONE-acetaminophen (PERCOCET) 5-325 MG tablet Take 1-2 tablets by mouth every 8 (eight) hours as needed for severe pain. 08/29/20 08/29/21   Lavonia Drafts, MD  pantoprazole (PROTONIX) 40 MG tablet Take 1 tablet (40 mg total) by mouth 2 (two) times daily before a meal. 05/02/20 07/31/20  Lucilla Lame, MD  torsemide (DEMADEX) 20 MG tablet TAKE 1 TABLET BY MOUTH TWICE DAILY 08/24/20   Minna Merritts, MD    Physical Exam: Vitals:   08/31/20 1813 08/31/20 1814  BP:  (!) 181/81  Pulse:  91  Resp:  18  Temp:  (!) 101.5 F (38.6 C)  TempSrc:  Oral  SpO2:  98%  Weight: 95.3 kg   Height: _0  (1.88 m)    Physical Exam Constitutional:      General: He is not in acute distress.    Appearance: Normal appearance.  HENT:     Head: Normocephalic and atraumatic.     Mouth/Throat:     Mouth: Mucous membranes are moist.     Pharynx: Oropharynx is clear.  Eyes:     Extraocular Movements: Extraocular movements intact.     Pupils: Pupils are equal, round, and reactive to light.  Cardiovascular:     Rate and Rhythm: Normal rate and regular rhythm.     Pulses: Normal pulses.     Heart sounds: Normal heart sounds.  Pulmonary:     Effort: Pulmonary effort is normal. No respiratory distress.     Breath sounds: Normal breath sounds.  Abdominal:     General: Bowel sounds are normal. There is no distension.     Palpations: Abdomen is soft.     Tenderness: There is no abdominal tenderness.  Musculoskeletal:        General: No swelling or deformity.  Skin:    General: Skin is warm and dry.     Comments: Skin changes consistent with peripheral arterial disease of bilateral lower extremities.  Neurological:     General: No focal deficit present.     Mental Status: Mental status is at baseline.     Comments: Bilateral lower lower extremity numbness, chronic.    Labs on Admission: I have personally reviewed following labs and imaging studies  CBC: Recent Labs  Lab 08/29/20 1110 08/31/20 1830  WBC 18.3* 7.1  NEUTROABS  --  5.2  HGB 13.1 12.5*  HCT 37.1*  36.7*  MCV 89.8 90.8  PLT 245 267    Basic Metabolic Panel: Recent  Labs  Lab 08/29/20 1110 08/31/20 1830  NA 131* 134*  K 4.4 3.6  CL 97* 97*  CO2 23 25  GLUCOSE 287* 241*  BUN 42* 39*  CREATININE 2.50* 2.82*  CALCIUM 8.6* 8.7*    GFR: Estimated Creatinine Clearance: 32 mL/min (A) (by C-G formula based on SCr of 2.82 mg/dL (H)).  Liver Function Tests: Recent Labs  Lab 08/31/20 1830  AST 39  ALT 34  ALKPHOS 87  BILITOT 0.5  PROT 7.2  ALBUMIN 3.1*    Urine analysis:    Component Value Date/Time   COLORURINE YELLOW (A) 08/29/2020 1230   APPEARANCEUR CLOUDY (A) 08/29/2020 1230   LABSPEC 1.018 08/29/2020 1230   PHURINE 6.0 08/29/2020 1230   GLUCOSEU >=500 (A) 08/29/2020 1230   HGBUR MODERATE (A) 08/29/2020 1230   BILIRUBINUR NEGATIVE 08/29/2020 1230   [place] 08/29/2020 1230   PROTEINUR >=300 (A) 08/29/2020 1230   NITRITE NEGATIVE 08/29/2020 1230   LEUKOCYTESUR TRACE (A) 08/29/2020 1230    Radiological Exams on Admission: DG Chest 2 View  Result Date: 08/31/2020 CLINICAL DATA:  Weakness EXAM: CHEST - 2 VIEW COMPARISON:  08/24/2019 FINDINGS: Prior median sternotomy. Heart and mediastinal contours are within normal limits. No focal opacities or effusions. No acute bony abnormality. IMPRESSION: No active cardiopulmonary disease. Electronically Signed   By: Rolm Baptise M.D.   On: 08/31/2020 18:53   EKG: Independently reviewed.  Normal sinus rhythm at 89 bpm.  Evidence of LVH.  Similar to previous.  Assessment/Plan Principal Problem:   Complicated UTI (urinary tract infection) Active Problems:   Chronic obstructive pulmonary disease (HCC)   Essential hypertension   Diabetes ([place])   Coronary artery disease with history of myocardial infarction without history of CABG   Depression with anxiety   Diabetic peripheral neuropathy ([place])   Other hyperlipidemia   Sleep apnea   PAD (peripheral artery disease) (HCC)   Cervical disc disease with myelopathy   Chronic pain syndrome   Chronic leg and foot pain (Bilateral)    Diabetes mellitus with stage 4 chronic kidney disease GFR 15-29 ([place])   Barrett's esophagus with low grade dysplasia  Complicated UTI > Male patient with UTI previously seen 2 days ago given to ceftriaxone and prescribed Keflex.  Continued to feel poorly at home and spiked a fever here. > Concern for failure of initial outpatient.  Does not meet criteria for sepsis as currently has only fever and source.  Did receive 3 L of IV fluids in the ED. > We will monitor culture results and response to therapy.  If his cultures are sensitive there is a possibility of concurrent viral illness given that he has had some improvement in his leukocytosis and urinary symptoms. - Continue cefepime - Follow-up sensitivities to cultures from 2 days ago, and today - Trend fever curve and white count - Follow-up repeat lactic acid  Diabetes > Poorly controlled per patient's own report he has had it for a long time and has had multiple medication/insulin adjustments with inability to get good control of his diabetes. > On Tresiba 140 units daily at home and sliding scale - We will give Lantus 50 units twice daily - SSI   CKD 4 ?  Dehydration > Recurrent episodes of dehydration secondary to poor controlled diabetes with glucose induced osmotic diuresis in addition to being on a daily diuretic. > Creatinine of 2.8 with EGFR  of 25 in the ED which is near his baseline. > Received 3 L IV fluids in the ED - Avoid nephrotoxic agents - Trend renal function electrolytes - Hold torsemide  HFimpEF > History of decreased EF around the time of his heart attack in 2018 however this is improved to 50-55% > Is on daily torsemide due to his edema but he states he has been told it is unclear as to the etiology of this given his CKD, heart failure and history of lymphedema - Hold torsemide for now  GERD/Barrett's esophagus - Continue home Protonix  Cervical disc and spine disease Chronic pain Neuropathy - Continue  home PRN hydrocodone  Hyperlipidemia CAD MI and CABG x4 in 2018 PAD HTN - Continue home enalapril and Coreg - Continue home Zetia.  Also takes Praluent (PSK9).  COPD - Continue home Breo and as needed albuterol  Sleep apnea - Not currently on any CPAP for this.   DVT prophylaxis: Lovenox  Code Status:   Full Family Communication:  None on admission Disposition Plan:   Patient is from:  Home  Anticipated DC to:  Home  Anticipated DC date:  1 to 2 days  Anticipated DC barriers: None  Consults called:  None  Admission status:  Observation, telemetry   Severity of Illness: The appropriate patient status for this patient is OBSERVATION. Observation status is judged to be reasonable and necessary in order to provide the required intensity of service to ensure the patient's safety. The patient's presenting symptoms, physical exam findings, and initial radiographic and laboratory data in the context of their medical condition is felt to place them at decreased risk for further clinical deterioration. Furthermore, it is anticipated that the patient will be medically stable for discharge from the hospital within 2 midnights of admission. The following factors support the patient status of observation.   " The patient's presenting symptoms include fatigue, nausea, vomiting, headache, dizziness. " The physical exam findings include lower extremity skin changes consistent with PAD, bilateral lower extremity numbness. " The initial radiographic and laboratory data are creatinine 2.8, hemoglobin 12.5, glucose 241.  Marcelyn Bruins MD [facility]  How to contact the [facility] Attending or Consulting provider [place] or covering provider during after hours [place], for this patient?   1. Check the care team in [facility] and look for a) attending/consulting [facility] provider listed and b) the [facility] team listed 2. Log into [email or web address] and use [place]'s universal password to access. If you do not have  the password, please contact the hospital operator. 3. Locate the [facility] provider you are looking for under [facility] and page to a number that you can be directly reached. 4. If you still have difficulty reaching the provider, please page the [facility] (Director on Call) for the Hospitalists listed on amion for assistance.  08/31/2020, 8:20 PM

## 2020-08-31 NOTE — Consult Note (Signed)
CODE SEPSIS - PHARMACY COMMUNICATION  **Broad Spectrum Antibiotics should be administered within 1 hour of Sepsis diagnosis**  Time Code Sepsis Called/Page Received: 1846  Antibiotics Ordered: cefepime  Time of 1st antibiotic administration: 1960  Additional action taken by pharmacy: Called nurse  If necessary, Name of Provider/Nurse Contacted: Mickel Baas [phone] waiting for IV team   Benn Moulder, PharmD Pharmacy Resident  08/31/2020 6:47 PM

## 2020-08-31 NOTE — ED Triage Notes (Signed)
Pt states he is here for dehydration- pt was seen here for same 2 days ago and was given 1L fluid- pt states he is having pain in his extremities, intense HA, dizziness- pt pt states he has had n/v/d

## 2020-08-31 NOTE — ED Notes (Signed)
Sandwich tray and diet shasta provided

## 2020-08-31 NOTE — Progress Notes (Signed)
PHARMACIST - PHYSICIAN COMMUNICATION  CONCERNING:  Enoxaparin (Lovenox) for DVT Prophylaxis    RECOMMENDATION: Patient was prescribed enoxaprin '30mg'$  q24 hours for VTE prophylaxis.   Filed Weights   08/31/20 1813  Weight: 95.3 kg (210 lb)    Body mass index is 26.96 kg/m.  Estimated Creatinine Clearance: 32 mL/min (A) (by C-G formula based on SCr of 2.82 mg/dL (H)).  Patient is candidate for enoxaparin '40mg'$  every 24 hours based on CrCl >49m/min  DESCRIPTION: [facility]  Patient is now receiving enoxaparin 40 mg every 24 hours   MBenn Moulder PharmD Pharmacy Resident  08/31/2020 8:12 PM

## 2020-08-31 NOTE — ED Notes (Signed)
1 set cultures obtained per Charna Archer, MD

## 2020-08-31 NOTE — ED Provider Notes (Signed)
[facility] Emergency Department Provider Note   ____________________________________________   Event Date/Time   First MD Initiated Contact with Patient 08/31/20 1833     (approximate)  I have reviewed the triage vital signs and the nursing notes.   HISTORY  Chief Complaint Dehydration    HPI Quartez Bradeen is a 60 y.o. male with past medical history of hypertension, hyperlipidemia, diabetes, CKD, CAD, COPD, CHF, and peripheral arterial disease who presents to the ED complaining of malaise.  Patient reports that he has been feeling increasingly weak and malaised for about the past 7 days.  He reports subjective fevers and chills at home, but has not checked his temperature.  He has felt nauseous with a couple episodes of vomiting, but denies abdominal pain or diarrhea.  He denies any cough, chest pain, shortness of breath, dysuria, hematuria, or flank pain.  He was seen in the ED 2 days ago and diagnosed with UTI, given Rocephin and started on Keflex.  He states he has been taking the antibiotic but continues to feel bad and is requesting more IV fluids.        Past Medical History:  Diagnosis Date  . Arrhythmia   . Arthritis   . CAD (coronary artery disease)    a. 12/2016 s/p CABG x 4 (LIMA->LAD, VG->RCA, VG->OM1, VG->D1); b. 02/2018 MV: EF 35%, small-med inferolaterlal infarct. No ischemia.  . Colon polyps   . Diabetes ([place])   . Dupre's syndrome   . GERD (gastroesophageal reflux disease)   . HFimpEF (heart failure with improved ejection fraction) ([place])    a. 2018 EF 35%; b. 02/2018 EF 50%; c. 02/2019 EF 40-45%; d. 08/2019 Echo: EF 50-55%, no rwma, Gr2 DD. Nl RV size/fxn. Triv MR. Mild Ao sclerosis.  . Hyperlipidemia   . Hypertension   . Ischemic cardiomyopathy    a. 02/2018 MV: EF 35%; b. 08/2019 Echo: EF 50-55%.  . Lymphedema   . Migraine   . Neuropathy   . PAD (peripheral artery disease) ([place])    a. 04/2017 Aortoiliac duplex: R iliac dzs.  .  Peripheral vascular disease ([place])   . Retinopathy   . Sleep apnea   . Stage 3 chronic kidney disease ([place])   . Stomach ulcer     Patient Active Problem List   Diagnosis Date Noted  . Barrett's esophagus with low grade dysplasia   . Diabetes mellitus with stage 4 chronic kidney disease GFR 15-29 ([place]) 06/01/2020  . Serum albumin decreased 06/01/2020  . Vitamin D deficiency 06/01/2020  . Elevated hemoglobin A1c 06/01/2020  . Elevated sed rate 06/01/2020  . Elevated brain natriuretic peptide (BNP) level 06/01/2020  . Insulin dependent type 2 diabetes mellitus, uncontrolled ([place]) 06/01/2020  . Diabetes mellitus with complication, with long-term current use of insulin ([place]) 06/01/2020  . Chronic pain syndrome 05/24/2020  . Pharmacologic therapy 05/24/2020  . Disorder of skeletal system 05/24/2020  . Problems influencing health status 05/24/2020  . DDD (degenerative disc disease), cervical 05/24/2020  . 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 Cervical Grade 1 Retrolisthesis of C4/C5 and C5/C6 05/24/2020  . Chronic hand pain (Bilateral) 05/24/2020  . Chronic leg and foot pain (Bilateral) 05/24/2020  . Chronic lower extremity numbness from knee down (Bilateral) 05/24/2020  . Chronic greater occipital neuralgia (Bilateral) 05/24/2020  . Cervical facet syndrome (Left) 05/24/2020  . Cervical disc disease with myelopathy 04/26/2020  . Cervicalgia 04/09/2020  . Syncope and collapse 04/09/2020  . Esophageal dysphagia   .  Gastroesophageal reflux disease with esophagitis without hemorrhage   . PAD (peripheral artery disease) ([place]) 09/27/2019  . Diabetes ([place]) 09/14/2019  . Swelling of limb 09/14/2019  . Lymphedema 09/14/2019  . Chronic obstructive pulmonary disease ([place]) 09/05/2019  . Shortness of breath 09/05/2019  . Chest pain 07/16/2018  . Essential hypertension 04/28/2018  . Open wound of right foot 01/28/2018  . History of pancreatitis 11/16/2017  .  Statin-induced myositis 11/16/2017  . Type 2 diabetes mellitus with diabetic polyneuropathy, with long-term current use of insulin ([place]) 11/16/2017  . Coronary atherosclerosis 11/15/2016  . Heel ulcer due to DM ([place]) 12/09/2012  . Septic olecranon bursitis 10/14/2011  . Cellulitis 10/06/2011  . Depression with anxiety 02/22/2011  . Diabetic peripheral neuropathy ([place]) 02/22/2011  . Migraines 02/22/2011  . Other hyperlipidemia 02/22/2011  . Sleep apnea 11/22/2010  . Coronary artery disease with history of myocardial infarction without history of CABG 10/21/2009    Past Surgical History:  Procedure Laterality Date  . bone graft surgery    . CARDIAC CATHETERIZATION  2013   S/p PCI   . CARDIAC CATHETERIZATION  2018   S/p CABG  . CARDIAC SURGERY    . CORONARY ARTERY BYPASS GRAFT  2018   (LIMA-LAD,VG-RCA,VG-OM1,VG-D1)  . ESOPHAGOGASTRODUODENOSCOPY (EGD) WITH PROPOFOL N/A 04/04/2020   Procedure: ESOPHAGOGASTRODUODENOSCOPY (EGD) WITH PROPOFOL;  Surgeon: Lucilla Lame, MD;  Location: [facility] ENDOSCOPY;  Service: Endoscopy;  Laterality: N/A;  . ESOPHAGOGASTRODUODENOSCOPY (EGD) WITH PROPOFOL N/A 06/06/2020   Procedure: ESOPHAGOGASTRODUODENOSCOPY (EGD) WITH PROPOFOL;  Surgeon: Lucilla Lame, MD;  Location: [facility] ENDOSCOPY;  Service: Endoscopy;  Laterality: N/A;  . EYE SURGERY    . PERIPHERAL VASCULAR CATHETERIZATION Right 10/28/2016   PTA/DEB Right SFA  . WRIST SURGERY      Prior to Admission medications   Medication Sig Start Date End Date Taking? Authorizing Provider  albuterol (ACCUNEB) 0.63 MG/3ML nebulizer solution Inhale into the lungs.    [provider]  Alirocumab (PRALUENT) 75 MG/ML SOAJ Inject 75 mg into the skin every 14 (fourteen) days. 10/13/19   Minna Merritts, MD  aspirin EC 81 MG tablet Take 81 mg by mouth daily. Swallow whole.    [provider]  carvedilol (COREG) 12.5 MG tablet Take 12.5 mg by mouth 2 (two) times daily with a meal.    [provider]  cephALEXin (KEFLEX) 500 MG capsule Take 1 capsule (500 mg total) by mouth 2 (two) times daily for 10 days. 08/29/20 09/08/20  Lavonia Drafts, MD  cholecalciferol (VITAMIN D3) 25 MCG (1000 UNIT) tablet Take 2,000 Units by mouth daily.    [provider]  diclofenac Sodium (VOLTAREN) 1 % GEL Apply 2 g topically in the morning and at bedtime. To the neck muscle 03/07/20   [name], MD  enalapril (VASOTEC) 5 MG tablet Take 1 tablet (5 mg total) by mouth daily. 07/20/20   McDonough, Si Gaul, PA-C  ezetimibe (ZETIA) 10 MG tablet TAKE 1 TABLET BY MOUTH EVERY DAY 01/03/20   [name], MD  fluticasone furoate-vilanterol (BREO ELLIPTA) 100-25 MCG/INH AEPB Inhale 1 puff into the lungs daily.    [provider]  HYDROcodone-acetaminophen (NORCO) 7.5-325 MG tablet Take 1 tablet by mouth as needed for moderate pain.    [provider]  insulin degludec (TRESIBA FLEXTOUCH) 200 UNIT/ML FlexTouch Pen Inject 140 Units into the skin daily. 05/01/20   Ronnell Freshwater, NP  insulin lispro (HUMALOG) 100 UNIT/ML KwikPen Inject into the skin. 0-20 units per sliding scale  [provider]  Insulin Pen Needle (B-D ULTRAFINE III SHORT PEN) 31G X 8 MM MISC To use with pen basal and mealtime coverage insulin pens 5 times daily. DX: E11.65 04/17/20   Ronnell Freshwater, NP  metoCLOPramide (REGLAN) 5 MG tablet One tab before each meal and one at bed time for dyspepsia prn 01/19/20   [name], MD  metolazone (ZAROXOLYN) 5 MG tablet Take 5 mg by mouth 2 (two) times a week. As needed for leg swelling    [provider]  oxyCODONE-acetaminophen (PERCOCET) 5-325 MG tablet Take 1-2 tablets by mouth every 8 (eight) hours as needed for severe pain. 08/29/20 08/29/21  Lavonia Drafts, MD  pantoprazole (PROTONIX) 40 MG tablet Take 1 tablet (40 mg total) by mouth 2 (two) times daily before a meal. 05/02/20 07/31/20  Lucilla Lame, MD  torsemide (DEMADEX) 20 MG tablet TAKE 1 TABLET BY MOUTH  TWICE DAILY 08/24/20   Minna Merritts, MD    Allergies Contrast media [iodinated diagnostic agents], Other, Statins, Atorvastatin, Entresto [sacubitril-valsartan], Pravastatin, Pregabalin, and Rosuvastatin  Family History  Problem Relation Age of Onset  . Diabetes Mother   . Heart disease Father     Social History Social History   Tobacco Use  . Smoking status: Former Smoker    Types: Cigarettes    Quit date: 05/18/2012    Years since quitting: 8.2  . Smokeless tobacco: Never Used  Vaping Use  . Vaping Use: Never used  Substance Use Topics  . Alcohol use: Yes    Comment: very rarely  . Drug use: Never    Review of Systems  Constitutional: Positive for subjective fever/chills.  Positive for generalized weakness and malaise. Eyes: No visual changes. ENT: No sore throat. Cardiovascular: Denies chest pain. Respiratory: Denies shortness of breath. Gastrointestinal: No abdominal pain.  Positive for nausea and vomiting.  No diarrhea.  No constipation. Genitourinary: Negative for dysuria. Musculoskeletal: Negative for back pain. Skin: Negative for rash. Neurological: Negative for headaches, focal weakness or numbness.  ____________________________________________   PHYSICAL EXAM:  VITAL SIGNS: ED Triage Vitals  Enc Vitals Group     BP 08/31/20 1814 (!) 181/81     Pulse Rate 08/31/20 1814 91     Resp 08/31/20 1814 18     Temp 08/31/20 1814 (!) 101.5 F (38.6 C)     Temp Source 08/31/20 1814 Oral     SpO2 08/31/20 1814 98 %     Weight 08/31/20 1813 210 lb (95.3 kg)     Height 08/31/20 1813 '6\' 2"'$  (1.88 m)     Head Circumference --      Peak Flow --      Pain Score 08/31/20 1826 8     Pain Loc --      Pain Edu? --      Excl. in [place]? --     Constitutional: Alert and oriented. Eyes: Conjunctivae are normal. Head: Atraumatic. Nose: No congestion/rhinnorhea. Mouth/Throat: Mucous membranes are moist. Neck: Normal ROM Cardiovascular: Normal rate, regular  rhythm. Grossly normal heart sounds. Respiratory: Normal respiratory effort.  No retractions. Lungs CTAB. Gastrointestinal: Soft and nontender.  No CVA tenderness bilaterally.  No distention. Genitourinary: deferred Musculoskeletal: No lower extremity tenderness nor edema. Neurologic:  Normal speech and language. No gross focal neurologic deficits are appreciated. Skin:  Skin is warm, dry and intact. No rash noted. Psychiatric: Mood and affect are normal. Speech and behavior are normal.  ____________________________________________   LABS (all labs ordered are listed,  but only abnormal results are displayed)  Labs Reviewed  COMPREHENSIVE METABOLIC PANEL - Abnormal; Notable for the following components:      Result Value   Sodium 134 (*)    Chloride 97 (*)    Glucose, Bld 241 (*)    BUN 39 (*)    Creatinine, Ser 2.82 (*)    Calcium 8.7 (*)    Albumin 3.1 (*)    GFR, Estimated 25 (*)    All other components within normal limits  CBC WITH DIFFERENTIAL/PLATELET - Abnormal; Notable for the following components:   RBC 4.04 (*)    Hemoglobin 12.5 (*)    HCT 36.7 (*)    All other components within normal limits  CULTURE, BLOOD (ROUTINE X 2)  CULTURE, BLOOD (ROUTINE X 2)  RESP PANEL BY RT-PCR (FLU A&B, COVID) [id number]  URINE CULTURE  LACTIC ACID, PLASMA  LACTIC ACID, PLASMA  URINALYSIS, COMPLETE (UACMP) WITH MICROSCOPIC   ____________________________________________  EKG  ED ECG REPORT I, Blake Divine, the attending physician, personally viewed and interpreted this ECG.   Date: 08/31/2020  EKG Time: 18:20  Rate: 89  Rhythm: normal sinus rhythm  Axis: Normal  Intervals:none  ST&T Change: None   PROCEDURES  Procedure(s) performed (including Critical Care):  Procedures   ____________________________________________   INITIAL IMPRESSION / ASSESSMENT AND PLAN / ED COURSE       60 year old male with past medical history of hypertension, hyperlipidemia, diabetes,  CKD, CAD, PAD, CHF, and COPD who presents to the ED with ongoing subjective fevers and chills, generalized weakness, nausea, and vomiting after being diagnosed with UTI and dehydration 2 days ago.  Presentation is concerning for sepsis given patient noted to have a fever in triage.  He states he has been compliant with antibiotics and culture growing E. coli thus far with susceptibilities not yet available.  We will broaden antibiotics to cefepime and hydrate with 30 cc/kg of IV fluids.  Anticipate admission for further treatment of sepsis secondary to UTI.  Lab work is reassuring, shows mild AKI from 2 days prior.  Given patient continues to feel poorly despite antibiotics, case discussed with hospitalist for admission.      ____________________________________________   FINAL CLINICAL IMPRESSION(S) / ED DIAGNOSES  Final diagnoses:  Fever, unspecified fever cause  Urinary tract infection without hematuria, site unspecified  Non-intractable vomiting with nausea, unspecified vomiting type     ED Discharge Orders    None       Note:  This document was prepared using Dragon voice recognition software and may include unintentional dictation errors.   Blake Divine, MD 08/31/20 2002

## 2020-08-31 NOTE — Consult Note (Signed)
PHARMACY -  BRIEF ANTIBIOTIC NOTE   Pharmacy has received consult(s) for cefepime from an ED provider.  The patient's profile has been reviewed for ht/wt/allergies/indication/available labs.    One time order(s) placed for cefepime 2g IV x 1 dose per MD   Further antibiotics/pharmacy consults should be ordered by admitting physician if indicated.                       Thank you,  Benn Moulder, PharmD Pharmacy Resident  08/31/2020 6:46 PM

## 2020-09-01 DIAGNOSIS — N390 Urinary tract infection, site not specified: Secondary | ICD-10-CM | POA: Diagnosis not present

## 2020-09-01 LAB — COMPREHENSIVE METABOLIC PANEL
ALT: 26 U/L (ref 0–44)
AST: 30 U/L (ref 15–41)
Albumin: 2.5 g/dL — ABNORMAL LOW (ref 3.5–5.0)
Alkaline Phosphatase: 61 U/L (ref 38–126)
Anion gap: 10 (ref 5–15)
BUN: 35 mg/dL — ABNORMAL HIGH (ref 8–23)
CO2: 24 mmol/L (ref 22–32)
Calcium: 8.2 mg/dL — ABNORMAL LOW (ref 8.9–10.3)
Chloride: 101 mmol/L (ref 98–111)
Creatinine, Ser: 2.63 mg/dL — ABNORMAL HIGH (ref 0.61–1.24)
GFR, Estimated: 27 mL/min — ABNORMAL LOW (ref >60)
Glucose, Bld: 204 mg/dL — ABNORMAL HIGH (ref 70–99)
Potassium: 3.3 mmol/L — ABNORMAL LOW (ref 3.5–5.1)
Sodium: 135 mmol/L (ref 135–145)
Total Bilirubin: 0.7 mg/dL (ref 0.3–1.2)
Total Protein: 6 g/dL — ABNORMAL LOW (ref 6.5–8.1)

## 2020-09-01 LAB — CBC
HCT: 30.7 % — ABNORMAL LOW (ref 39.0–52.0)
Hemoglobin: 10.7 g/dL — ABNORMAL LOW (ref 13.0–17.0)
MCH: 31.6 pg (ref 26.0–34.0)
MCHC: 34.9 g/dL (ref 30.0–36.0)
MCV: 90.6 fL (ref 80.0–100.0)
Platelets: 198 10*3/uL (ref 150–400)
RBC: 3.39 MIL/uL — ABNORMAL LOW (ref 4.22–5.81)
RDW: 14.4 % (ref 11.5–15.5)
WBC: 8.5 10*3/uL (ref 4.0–10.5)
nRBC: 0 % (ref 0.0–0.2)

## 2020-09-01 LAB — PROTIME-INR
INR: 1.2 (ref 0.8–1.2)
Prothrombin Time: 14.3 seconds (ref 11.4–15.2)

## 2020-09-01 LAB — URINE CULTURE: Culture: >=100000 — AB

## 2020-09-01 LAB — GLUCOSE, CAPILLARY
Glucose-Capillary: 265 mg/dL — ABNORMAL HIGH (ref 70–99)
Glucose-Capillary: 161 mg/dL — ABNORMAL HIGH (ref 70–99)
Glucose-Capillary: 262 mg/dL — ABNORMAL HIGH (ref 70–99)

## 2020-09-01 LAB — HIV ANTIBODY (ROUTINE TESTING W REFLEX): HIV Screen 4th Generation wRfx: NONREACTIVE

## 2020-09-01 MED ORDER — SODIUM CHLORIDE 0.9 % IV SOLN
2.0000 g | INTRAVENOUS | Status: DC
  Filled 2020-09-01: qty 20

## 2020-09-01 MED ORDER — TORSEMIDE 20 MG PO TABS: 20.0000 mg | ORAL_TABLET | Freq: Every day | ORAL | 0 refills | Status: DC | PRN

## 2020-09-01 MED ORDER — POTASSIUM CHLORIDE CRYS ER 20 MEQ PO TBCR
40.0000 meq | EXTENDED_RELEASE_TABLET | Freq: Once | ORAL | Status: AC
  Administered 2020-09-01: 40 meq via ORAL
  Filled 2020-09-01: qty 2

## 2020-09-01 NOTE — Discharge Summary (Signed)
Physician Discharge Summary  [name] [name] [id number] DOB: Sep 09, 1960 DOA: 08/31/2020  PCP: [name] Guise, MD  Admit date: 08/31/2020 Discharge date: 09/01/2020  Admitted From: home Discharge disposition: home   Recommendations for Outpatient Follow-Up:   1. Outpatient PT 2. Needs better control of his DM 3. Changed demadex to PRN- while blood sugars uncontrolled 4. Patient to finish abx course 5. If has another temp will need renal u/s and or CT scan of kidneys 6. Also: per pharmacy: praluent had ~5% incidence of UTI in studies     Discharge Diagnosis:   Principal Problem:   Complicated UTI (urinary tract infection) Active Problems:   Chronic obstructive pulmonary disease (HCC)   Essential hypertension   Diabetes ([place])   Coronary artery disease with history of myocardial infarction without history of CABG   Depression with anxiety   Diabetic peripheral neuropathy ([place])   Other hyperlipidemia   Sleep apnea   PAD (peripheral artery disease) (HCC)   Cervical disc disease with myelopathy   Chronic pain syndrome   Chronic leg and foot pain ([place])   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  Diet recommendation: Low sodium, heart healthy.  Carbohydrate-modified  Wound care: None.  Code status: Full.   History of Present Illness:   [name] [name] is a 60 y.o. male with medical history significant of GERD/Barrett's esophagus, cervical disc and spine disease, chronic pain, neuropathy, hyperlipidemia, CAD, PAD, depression, anxiety, diabetes, hypertension, migraines, CKD 4, sleep apnea, lymphedema who presents with continued fatigue and nausea with recent UTI diagnosis.             Patient was seen 2 days ago for similar symptoms of weakness and malaise that have been ongoing for about a week at that time.  This was something that reportedly occurs  commonly for him.  He had reported fevers and chills at home prior to this subjectively but did not have any elevated temperatures.  He was evaluated in the ED given IV fluids for dehydration and also noted to have UTI.  He was given a dose of ceftriaxone and discharged on Keflex.  He was also given IV fluids.  His urine culture from that visit shows E. coli but sensitivities have not yet resulted.             Patient has continued to feel poorly for the past couple days and is concerned about dehydration once again and had presented for additional IV fluids.  Also reports headache and some dizziness in addition to his nausea vomiting fatigue.  He was noted to have fever in the ED with concern for failure of initial antibiotic therapy.  He does report some improvement in his urinary symptoms since beginning treatment.             He denies chest pain, shortness of breath, abdominal pain, constipation, diarrhea.  He states that he has recurrent episodes of fatigue malaise due to dehydration secondary to his difficult to control diabetes leading to glucose induced osmotic diuresis.  He states he frequently needs to get IV fluids for this.  He also states that he ends up getting frequent UTIs due to having high concentrations for glucose in his urine.    Hospital Course by Problem:   UTI -pan sensitive -continue keflex now that he is not vomiting -checked PVR: pending -if continues to have issues  with fever would need imaging of kidneys  N/V- multifactorial -continue reglan and better control of DM  Uncontrolled DM- patient to bring log of blood sugars as well as diary of food intake to next visit  Dehydration -changed demadex to PRN  Hypokalemia -repleted   Medical Consultants:      Discharge Exam:   Vitals:   09/01/20 0803 09/01/20 1142  BP: 139/70 (!) 144/71  Pulse: 76 73  Resp: 18 18  Temp: 98.6 F (37 C) 99.1 F (37.3 C)  SpO2:  97%   Vitals:   09/01/20 0622 09/01/20  0700 09/01/20 0803 09/01/20 1142  BP: (!) 150/81  139/70 (!) 144/71  Pulse: 81  76 73  Resp: '20  18 18  '$ Temp: 98.4 F (36.9 C)  98.6 F (37 C) 99.1 F (37.3 C)  TempSrc: Oral  Oral Oral  SpO2: 97%   97%  Weight:  98.4 kg    Height:        General exam: Appears calm and comfortable.    The results of significant diagnostics from this hospitalization (including imaging, microbiology, ancillary and laboratory) are listed below for reference.     Procedures and Diagnostic Studies:   DG Chest 2 View  Result Date: 08/31/2020 CLINICAL DATA:  Weakness EXAM: CHEST - 2 VIEW COMPARISON:  08/24/2019 FINDINGS: Prior median sternotomy. Heart and mediastinal contours are within normal limits. No focal opacities or effusions. No acute bony abnormality. IMPRESSION: No active cardiopulmonary disease. Electronically Signed   By: Rolm Baptise M.D.   On: 08/31/2020 18:53     Labs:   Basic Metabolic Panel: Recent Labs  Lab 08/29/20 1110 08/31/20 1830 09/01/20 0517  NA 131* 134* 135  K 4.4 3.6 3.3*  CL 97* 97* 101  CO2 '23 25 24  '$ GLUCOSE 287* 241* 204*  BUN 42* 39* 35*  CREATININE 2.50* 2.82* 2.63*  CALCIUM 8.6* 8.7* 8.2*   GFR Estimated Creatinine Clearance: 34.3 mL/min (A) (by C-G formula based on SCr of 2.63 mg/dL (H)). Liver Function Tests: Recent Labs  Lab 08/31/20 1830 09/01/20 0517  AST 39 30  ALT 34 26  ALKPHOS 87 61  BILITOT 0.5 0.7  PROT 7.2 6.0*  ALBUMIN 3.1* 2.5*   No results for input(s): LIPASE, AMYLASE in the last 168 hours. No results for input(s): AMMONIA in the last 168 hours. Coagulation profile Recent Labs  Lab 09/01/20 0517  INR 1.2    CBC: Recent Labs  Lab 08/29/20 1110 08/31/20 1830 09/01/20 0517  WBC 18.3* 7.1 8.5  NEUTROABS  --  5.2  --   HGB 13.1 12.5* 10.7*  HCT 37.1* 36.7* 30.7*  MCV 89.8 90.8 90.6  PLT 245 225 198   Cardiac Enzymes: No results for input(s): CKTOTAL, CKMB, CKMBINDEX, TROPONINI in the last 168 hours. BNP: Invalid  input(s): POCBNP CBG: Recent Labs  Lab 08/31/20 2204 09/01/20 0130 09/01/20 0737 09/01/20 1142  GLUCAP 267* 265* 161* 262*   D-Dimer No results for input(s): DDIMER in the last 72 hours. Hgb A1c No results for input(s): HGBA1C in the last 72 hours. Lipid Profile No results for input(s): CHOL, HDL, LDLCALC, TRIG, CHOLHDL, LDLDIRECT in the last 72 hours. Thyroid function studies No results for input(s): TSH, T4TOTAL, T3FREE, THYROIDAB in the last 72 hours.  Invalid input(s): FREET3 Anemia work up No results for input(s): VITAMINB12, FOLATE, FERRITIN, TIBC, IRON, RETICCTPCT in the last 72 hours. Microbiology Recent Results (from the past 240 hour(s))  Urine culture  Status: Abnormal   Collection Time: 08/29/20 12:30 PM   Specimen: Urine, Random  Result Value Ref Range Status   Specimen Description   Final    URINE, RANDOM Performed at [facility], [address] [place], [place] [address]    Special Requests   Final    NONE Performed at [facility], [address] [place], [place] [address]    Culture >=100,000 COLONIES/mL ESCHERICHIA COLI (A)  Final   Report Status 09/01/2020 FINAL  Final   Organism ID, Bacteria ESCHERICHIA COLI (A)  Final      Susceptibility   Escherichia coli - MIC*    AMPICILLIN 4 SENSITIVE Sensitive     CEFAZOLIN <=4 SENSITIVE Sensitive     CEFEPIME <=0.12 SENSITIVE Sensitive     CEFTRIAXONE <=0.25 SENSITIVE Sensitive     CIPROFLOXACIN <=0.25 SENSITIVE Sensitive     GENTAMICIN <=1 SENSITIVE Sensitive     IMIPENEM <=0.25 SENSITIVE Sensitive     NITROFURANTOIN <=16 SENSITIVE Sensitive     TRIMETH/SULFA <=20 SENSITIVE Sensitive     AMPICILLIN/SULBACTAM <=2 SENSITIVE Sensitive     PIP/TAZO <=4 SENSITIVE Sensitive     * >=100,000 COLONIES/mL ESCHERICHIA COLI  Culture, blood (routine x 2)     Status: None (Preliminary result)   Collection Time: 08/31/20  6:30 PM   Specimen: BLOOD  Result Value Ref Range Status   Specimen  Description BLOOD RIGHT ANTECUBITAL  Final   Special Requests   Final    BOTTLES DRAWN AEROBIC AND ANAEROBIC Blood Culture results may not be optimal due to an excessive volume of blood received in culture bottles   Culture   Final    NO GROWTH < 12 HOURS Performed at [facility], [address] [place], [place] [address]    Report Status PENDING  Incomplete  Culture, blood (routine x 2)     Status: None (Preliminary result)   Collection Time: 08/31/20  7:42 PM   Specimen: BLOOD  Result Value Ref Range Status   Specimen Description BLOOD BLOOD LEFT FOREARM  Final   Special Requests   Final    BOTTLES DRAWN AEROBIC AND ANAEROBIC Blood Culture adequate volume   Culture   Final    NO GROWTH < 12 HOURS Performed at [facility], [address] [place], [place] [address]    Report Status PENDING  Incomplete  Resp Panel by RT-PCR (Flu A&B, Covid) Nasopharyngeal Swab     Status: None   Collection Time: 08/31/20  7:42 PM   Specimen: Nasopharyngeal Swab; Nasopharyngeal(NP) swabs in vial transport medium  Result Value Ref Range Status   SARS Coronavirus 2 by RT PCR NEGATIVE NEGATIVE Final    Comment: (NOTE) SARS-CoV-2 target nucleic acids are NOT DETECTED.  The SARS-CoV-2 RNA is generally detectable in upper respiratory specimens during the acute phase of infection. The lowest concentration of SARS-CoV-2 viral copies this assay can detect is 138 copies/mL. A negative result does not preclude SARS-Cov-2 infection and should not be used as the sole basis for treatment or other patient management decisions. A negative result may occur with  improper specimen collection/handling, submission of specimen other than nasopharyngeal swab, presence of viral mutation(s) within the areas targeted by this assay, and inadequate number of viral copies(<138 copies/mL). A negative result must be combined with clinical observations, patient history, and epidemiological information.  The expected result is Negative.  Fact Sheet for Patients:  EntrepreneurPulse.com.au  Fact Sheet for Healthcare Providers:  IncredibleEmployment.be  This  test is no t yet approved or cleared by the [place] and  has been authorized for detection and/or diagnosis of SARS-CoV-2 by FDA under an Emergency Use Authorization (EUA). This EUA will remain  in effect (meaning this test can be used) for the duration of the COVID-19 declaration under Section 564(b)(1) of the Act, 21 U.S.C.section 360bbb-3(b)(1), unless the authorization is terminated  or revoked sooner.       Influenza A by PCR NEGATIVE NEGATIVE Final   Influenza B by PCR NEGATIVE NEGATIVE Final    Comment: (NOTE) The Xpert Xpress SARS-CoV-2/FLU/RSV plus assay is intended as an aid in the diagnosis of influenza from Nasopharyngeal swab specimens and should not be used as a sole basis for treatment. Nasal washings and aspirates are unacceptable for Xpert Xpress SARS-CoV-2/FLU/RSV testing.  Fact Sheet for Patients: EntrepreneurPulse.com.au  Fact Sheet for Healthcare Providers: IncredibleEmployment.be  This test is not yet approved or cleared by the [place] FDA and has been authorized for detection and/or diagnosis of SARS-CoV-2 by FDA under an Emergency Use Authorization (EUA). This EUA will remain in effect (meaning this test can be used) for the duration of the COVID-19 declaration under Section 564(b)(1) of the Act, 21 U.S.C. section 360bbb-3(b)(1), unless the authorization is terminated or revoked.  Performed at [facility], [address], [place], [place] [address]      Discharge Instructions:   Discharge Instructions    Diet - low sodium heart healthy   Complete by: As directed    Diet Carb Modified   Complete by: As directed    Discharge instructions   Complete by: As directed    Your urine culture was  sensitive to keflex so continue that until finished I am concerned that your uncontrolled diabetes if causing a lot of your issues with N/V and dehydration.  I have changed your diuretics to PRN for now until your blood sugars are better controlled. -bring log of blood sugars along with food diary to your PCP So medications can be adjusted   Increase activity slowly   Complete by: As directed      Allergies as of 09/01/2020      Reactions   Contrast Media [iodinated Diagnostic Agents]    Kidney disease    Other Other (See Comments)   Pain  With joint stiffness    Statins    Pain  With joint stiffness    Atorvastatin Other (See Comments)   Muscle aches Muscle aches   Entresto [sacubitril-valsartan]    Hyperkalemia   Pravastatin Other (See Comments)   Muscle aches Muscle aches   Pregabalin Other (See Comments)   Generalized aches and pains Generalized aches and pains   Rosuvastatin Other (See Comments)   Muscle Aches Other reaction(s): JOINT PAIN Other reaction(s): JOINT PAIN Muscle Aches      Medication List    STOP taking these medications   HYDROcodone-acetaminophen 7.5-325 MG tablet Commonly known as: NORCO   metolazone 5 MG tablet Commonly known as: ZAROXOLYN     TAKE these medications   albuterol 0.63 MG/3ML nebulizer solution Commonly known as: ACCUNEB Inhale into the lungs.   aspirin EC 81 MG tablet Take 81 mg by mouth daily. Swallow whole.   B-D ULTRAFINE III SHORT PEN 31G X 8 MM Misc Generic drug: Insulin Pen Needle To use with pen basal and mealtime coverage insulin pens 5 times daily. DX: E11.65   Breo Ellipta 100-25 MCG/INH Aepb Generic drug: fluticasone furoate-vilanterol Inhale 1 puff  into the lungs daily.   carvedilol 12.5 MG tablet Commonly known as: COREG Take 12.5 mg by mouth 2 (two) times daily with a meal.   cephALEXin 500 MG capsule Commonly known as: KEFLEX Take 1 capsule (500 mg total) by mouth 2 (two) times daily for 10 days.    cholecalciferol 25 MCG (1000 UNIT) tablet Commonly known as: VITAMIN D3 Take 2,000 Units by mouth daily.   diclofenac Sodium 1 % Gel Commonly known as: Voltaren Apply 2 g topically in the morning and at bedtime. To the neck muscle   enalapril 5 MG tablet Commonly known as: Vasotec Take 1 tablet (5 mg total) by mouth daily.   ezetimibe 10 MG tablet Commonly known as: ZETIA TAKE 1 TABLET BY MOUTH EVERY DAY   insulin lispro 100 UNIT/ML KwikPen Commonly known as: HUMALOG Inject into the skin. 0-20 units per sliding scale   metoCLOPramide 5 MG tablet Commonly known as: Reglan One tab before each meal and one at bed time for dyspepsia prn   oxyCODONE-acetaminophen 5-325 MG tablet Commonly known as: Percocet Take 1-2 tablets by mouth every 8 (eight) hours as needed for severe pain.   pantoprazole 40 MG tablet Commonly known as: PROTONIX Take 1 tablet (40 mg total) by mouth 2 (two) times daily before a meal.   Praluent 75 MG/ML Soaj Generic drug: Alirocumab Inject 75 mg into the skin every 14 (fourteen) days.   torsemide 20 MG tablet Commonly known as: DEMADEX Take 1 tablet (20 mg total) by mouth daily as needed (leg swelling). What changed:   when to take this  reasons to take this   Tresiba FlexTouch 200 UNIT/ML FlexTouch Pen Generic drug: insulin degludec Inject 140 Units into the skin daily.         Time coordinating discharge: 25 min  Signed:  Geradine Girt DO  [facility] 09/01/2020, 1:47 PM

## 2020-09-01 NOTE — Progress Notes (Addendum)
Inpatient Diabetes Program Recommendations  AACE/ADA: New Consensus Statement on Inpatient Glycemic Control (2015)  Target Ranges:  Prepandial:   less than 140 mg/dL      Peak postprandial:   less than 180 mg/dL (1-2 hours)      Critically ill patients:  140 - 180 mg/dL   Results for HAIRL, HAINSWORTH (MRN [id number]) as of 09/01/2020 13:37  Ref. Range 08/31/2020 22:04 09/01/2020 01:30 09/01/2020 07:37 09/01/2020 11:42  Glucose-Capillary Latest Ref Range: 70 - 99 mg/dL 267 (H)     50 units LANTUS '@11'$ :04pm 265 (H) 161 (H)  3 units NOVOLOG  50 units LANTUS '@8'$ :45am  262 (H)  8 units NOVOLOG     Home DM Meds: Tresiba 140 units Daily       Humalog 0-20 units TID per SSI  Current Orders: Lantus 50 units BID      Novolog Moderate Correction Scale/ SSI (0-15 units) TID AC   MD- If for some reason pt not discharged home later today, please start Novolog Meal Coverage--  Novolog 4 units TID with meals Hold if pt eats <50% of meal, Hold if pt NPO     Most recent A1c was 14% back in Sept 2021--per PCP notes, was referred to ENDO in Feb of this year--Has appt at [facility] for diabetes counseling on 4/12   Note last visit with PCP was 07/20/2020  Spoke with pt by phone this afternoon--Verified home Insulin regimen with pt and inquired about ENDO referral.  Pt told me he had an appt with the ENDO today, however, he could not go since he is in the hospital.  Plans to call ENDO to re-schedule.  Pt told me he has Dexcom sensor (CGM) at home and plans to get refills for the sensor thru his PCP office.  Needs to start checking CBGs at home frequently (I think pt stated about 2 weeks) before he can meet with the new ENDO team (per pt, the ENDO team won't see him until he has CBG data).  Pt stated he will not start checking CBGs frequently until he can get his Sensor refills from his PCP.  I did review with him that we are giving him Lantus (as sub for [place]) and Novolog (as sub for  Humalog)--also reviewed current doses.  Did not have any questions for me at this time.    --Will follow patient during hospitalization--  Wyn Quaker RN, MSN, CDE Diabetes Coordinator Inpatient Glycemic Control Team Team Pager: [phone] (8a-5p)

## 2020-09-01 NOTE — Progress Notes (Signed)
Reviewed discharge papers with patient.  IV sites remain benign after pulling

## 2020-09-01 NOTE — TOC Transition Note (Signed)
Transition of Care [facility]) - CM/SW Discharge Note   Patient Details  Name: [name] [name] MRN: [id number] Date of Birth: Jan 21, 1961  [facility] CM/SW Contact:  Candie Chroman, LCSW Phone Number: 09/01/2020, 2:09 PM   Clinical Narrative: CSW met with patient. No supports at bedside. CSW introduced role and explained that PT recommendations would be discussed. Patient willing to consider outpatient PT and gave CSW permission to send referral. First preference is [facility]. Attending is currently at [facility] and unable to sign paper order form so she said she placed an order online. PT also recommended a rolling walker but patient declined. He has a rollator he prefers to use because he can sit when he gets tired. No further concerns. Patient has orders to discharge home today and says he will have a ride home. CSW signing off.    Final next level of care: Home w Home Health Services Barriers to Discharge: No Barriers Identified   Patient Goals and CMS Choice        Discharge Placement                    Patient and family notified of of transfer: 09/01/20  Discharge Plan and Services                                     Social Determinants of Health (SDOH) Interventions     Readmission Risk Interventions No flowsheet data found.

## 2020-09-01 NOTE — Progress Notes (Signed)
Await culture sensitivities, PT eval and hope for d/c later today as PT is doing well. Eulogio Bear DO

## 2020-09-01 NOTE — Progress Notes (Signed)
During bed side shift change report patient was resting in the bed. His bed alarm was not on so I told him since he was a high fall risk I was going to put the alarm on. It had been on during the night. He got upset and said he did not want the alarm that it felt like he was in prison with the alarm on. He wanted to get up and get in the chair. Patient was assisted to get up in the chair. He stated he did not want any alarms on. Patient is very unsteady on his feet with his neuropathy and has had multiple falls in the last 6 months. I educated the patient on the necessity of the alarm to keep him safe and allow [place] to help him get up. Patient still refused any alarms and remained in the chair. I told him I would document his refusal of the alarms and his acknowledgment of the need and he understood.

## 2020-09-01 NOTE — Plan of Care (Signed)
  Problem: Education: Goal: Knowledge of General Education information will improve Description: Including pain rating scale, medication(s)/side effects and non-pharmacologic comfort measures 09/01/2020 1424 by Vivien Rota, RN Outcome: Adequate for Discharge 09/01/2020 0709 by Vivien Rota, RN Outcome: Progressing   Problem: Health Behavior/Discharge Planning: Goal: Ability to manage health-related needs will improve 09/01/2020 1424 by Vivien Rota, RN Outcome: Adequate for Discharge 09/01/2020 0709 by Vivien Rota, RN Outcome: Progressing   Problem: Clinical Measurements: Goal: Ability to maintain clinical measurements within normal limits will improve 09/01/2020 1424 by Vivien Rota, RN Outcome: Adequate for Discharge 09/01/2020 0709 by Vivien Rota, RN Outcome: Progressing Goal: Will remain free from infection 09/01/2020 1424 by Vivien Rota, RN Outcome: Adequate for Discharge 09/01/2020 0709 by Vivien Rota, RN Outcome: Progressing Goal: Diagnostic test results will improve 09/01/2020 1424 by Vivien Rota, RN Outcome: Adequate for Discharge 09/01/2020 0709 by Vivien Rota, RN Outcome: Progressing Goal: Respiratory complications will improve 09/01/2020 1424 by Vivien Rota, RN Outcome: Adequate for Discharge 09/01/2020 0709 by Vivien Rota, RN Outcome: Progressing Goal: Cardiovascular complication will be avoided 09/01/2020 1424 by Vivien Rota, RN Outcome: Adequate for Discharge 09/01/2020 0709 by Vivien Rota, RN Outcome: Progressing   Problem: Activity: Goal: Risk for activity intolerance will decrease 09/01/2020 1424 by Vivien Rota, RN Outcome: Adequate for Discharge 09/01/2020 0709 by Vivien Rota, RN Outcome: Progressing   Problem: Nutrition: Goal: Adequate nutrition will be maintained 09/01/2020 1424 by Vivien Rota, RN Outcome: Adequate for  Discharge 09/01/2020 0709 by Vivien Rota, RN Outcome: Progressing   Problem: Coping: Goal: Level of anxiety will decrease 09/01/2020 1424 by Vivien Rota, RN Outcome: Adequate for Discharge 09/01/2020 0709 by Vivien Rota, RN Outcome: Progressing   Problem: Elimination: Goal: Will not experience complications related to bowel motility 09/01/2020 1424 by Vivien Rota, RN Outcome: Adequate for Discharge 09/01/2020 0709 by Vivien Rota, RN Outcome: Progressing Goal: Will not experience complications related to urinary retention 09/01/2020 1424 by Vivien Rota, RN Outcome: Adequate for Discharge 09/01/2020 0709 by Vivien Rota, RN Outcome: Progressing   Problem: Pain Managment: Goal: General experience of comfort will improve 09/01/2020 1424 by Vivien Rota, RN Outcome: Adequate for Discharge 09/01/2020 0709 by Vivien Rota, RN Outcome: Progressing   Problem: Safety: Goal: Ability to remain free from injury will improve 09/01/2020 1424 by Vivien Rota, RN Outcome: Adequate for Discharge 09/01/2020 0709 by Vivien Rota, RN Outcome: Progressing   Problem: Skin Integrity: Goal: Risk for impaired skin integrity will decrease 09/01/2020 1424 by Vivien Rota, RN Outcome: Adequate for Discharge 09/01/2020 0709 by Vivien Rota, RN Outcome: Progressing

## 2020-09-01 NOTE — Evaluation (Signed)
Physical Therapy Evaluation Patient Details Name: [name] [name] MRN: [id number] DOB: [birth year] Today's Date: 09/01/2020   History of Present Illness  Pt is a 60 y.o. male with medical history significant of GERD/Barrett's esophagus, cervical disc and spine disease, chronic pain, neuropathy, hyperlipidemia, CAD, PAD, depression, anxiety, diabetes, hypertension, migraines, CKD 4, sleep apnea, lymphedema who presents with continued fatigue and nausea with recent UTI diagnosis.  Pt diagnosed with complicated UTI.    Clinical Impression  Pt was pleasant and motivated to participate during the session.  Pt reported feeling close to his baseline physically but does endorse that his baseline includes progressive weakening and history of falls.  Pt presented with fair control and stability with transfers with cues for hand placement and was able to amb a max of 25' with a RW before fatiguing and requesting to return to sitting.  Pt presented with min instability during amb but was able to self-correct.  Pt was steady ascending and descending stairs with one rail with min verbal and visual cues for general sequencing.  Pt is at risk for further functional decline and falls and will benefit from OPPT services upon discharge to safely address deficits listed in patient problem list for decreased caregiver assistance and eventual return to PLOF.      Follow Up Recommendations Outpatient PT;Supervision for mobility/OOB    Equipment Recommendations  None recommended by PT (Pt declined RW)    Recommendations for Other Services       Precautions / Restrictions Precautions Precautions: Fall Restrictions Weight Bearing Restrictions: No      Mobility  Bed Mobility               General bed mobility comments: NT, pt in recliner    Transfers Overall transfer level: Needs assistance Equipment used: Rolling walker (2 wheeled) Transfers: Sit to/from Stand Sit to Stand: Supervision          General transfer comment: Min verbal cues for hand placement  Ambulation/Gait Ambulation/Gait assistance: Min guard Gait Distance (Feet): 25 Feet Assistive device: Rolling walker (2 wheeled) Gait Pattern/deviations: Step-through pattern;Trunk flexed Gait velocity: decreased   General Gait Details: Min verbal cues for amb closer to the RW with upright posture  Stairs Stairs: Yes Stairs assistance: Min guard Stair Management: Step to pattern;Forwards;One rail Left Number of Stairs: 4 General stair comments: Min verbal cues for sequencing with pt steady with fair eccentric and concentric control  Wheelchair Mobility    Modified Rankin (Stroke Patients Only)       Balance Overall balance assessment: Needs assistance;History of Falls   Sitting balance-Leahy Scale: Normal     Standing balance support: Bilateral upper extremity supported;During functional activity Standing balance-Leahy Scale: Fair Standing balance comment: min to mod lean on the RW for support                             Pertinent Vitals/Pain Pain Assessment: 0-10 Pain Score: 6  Pain Location: general body pain, chronic from neuropathy Pain Descriptors / Indicators: Sore Pain Intervention(s): Monitored during session;Premedicated before session    Home Living Family/patient expects to be discharged to:: Private residence Living Arrangements: Spouse/significant other Available Help at Discharge: Family;Available 24 hours/day Type of Home: House Home Access: Stairs to enter Entrance Stairs-Rails: Left Entrance Stairs-Number of Steps: 3 Home Layout: One level Home Equipment: Walker - 4 wheels;Cane - single point;Electric scooter;Shower seat      Prior Function Level of Independence: Independent with  assistive device(s)         Comments: Ind amb household distances with furniture cruising, [facility] for limited distances outside of the house, rollator for moderate distances, and electric  scooter for longer distances; 3 falls in the last 6 months from tripping     Hand Dominance        Extremity/Trunk Assessment   Upper Extremity Assessment Upper Extremity Assessment: Generalized weakness    Lower Extremity Assessment Lower Extremity Assessment: Generalized weakness       Communication   Communication: No difficulties  Cognition Arousal/Alertness: Awake/alert Behavior During Therapy: WFL for tasks assessed/performed Overall Cognitive Status: Within Functional Limits for tasks assessed                                        General Comments      Exercises     Assessment/Plan    PT Assessment Patient needs continued PT services  PT Problem List Decreased strength;Decreased activity tolerance;Decreased balance;Decreased mobility;Decreased knowledge of use of DME       PT Treatment Interventions DME instruction;Gait training;Stair training;Functional mobility training;Therapeutic activities;Therapeutic exercise;Balance training;Patient/family education    PT Goals (Current goals can be found in the Care Plan section)  Acute Rehab PT Goals Patient Stated Goal: To return home PT Goal Formulation: With patient Time For Goal Achievement: 09/14/20 Potential to Achieve Goals: Good    Frequency Min 2X/week   Barriers to discharge        Co-evaluation               AM-PAC PT "6 Clicks" Mobility  Outcome Measure Help needed turning from your back to your side while in a flat bed without using bedrails?: None Help needed moving from lying on your back to sitting on the side of a flat bed without using bedrails?: None Help needed moving to and from a bed to a chair (including a wheelchair)?: A Little Help needed standing up from a chair using your arms (e.g., wheelchair or bedside chair)?: A Little Help needed to walk in hospital room?: A Little Help needed climbing 3-5 steps with a railing? : A Little 6 Click Score: 20    End  of Session Equipment Utilized During Treatment: Gait belt Activity Tolerance: Patient tolerated treatment well Patient left: in chair;with call bell/phone within reach;with family/visitor present;Other (comment) (No chair alarm per nursing) Nurse Communication: Mobility status PT Visit Diagnosis: History of falling (Z91.81);Unsteadiness on feet (R26.81);Muscle weakness (generalized) (M62.81);Difficulty in walking, not elsewhere classified (R26.2)    Time: [id number] PT Time Calculation (min) (ACUTE ONLY): 35 min   Charges:   PT Evaluation $PT Eval Moderate Complexity: 1 Mod          D. Scott Alonda Weaber PT, DPT 09/01/20, 10:43 AM

## 2020-09-01 NOTE — Plan of Care (Signed)

## 2020-09-02 ENCOUNTER — Other Ambulatory Visit: Payer: Self-pay | Admitting: Pain Medicine

## 2020-09-02 DIAGNOSIS — E559 Vitamin D deficiency, unspecified: Secondary | ICD-10-CM

## 2020-09-03 LAB — URINE CULTURE: Culture: NO GROWTH

## 2020-09-05 ENCOUNTER — Encounter: Payer: Managed Care, Other (non HMO) | Attending: Endocrinology | Admitting: *Deleted

## 2020-09-05 ENCOUNTER — Other Ambulatory Visit: Payer: Self-pay

## 2020-09-05 ENCOUNTER — Encounter: Payer: Self-pay | Admitting: *Deleted

## 2020-09-05 VITALS — BP 152/76 | Ht 74.0 in | Wt 217.5 lb

## 2020-09-05 DIAGNOSIS — E119 Type 2 diabetes mellitus without complications: Secondary | ICD-10-CM | POA: Insufficient documentation

## 2020-09-05 DIAGNOSIS — E1165 Type 2 diabetes mellitus with hyperglycemia: Secondary | ICD-10-CM

## 2020-09-05 DIAGNOSIS — Z794 Long term (current) use of insulin: Secondary | ICD-10-CM

## 2020-09-05 LAB — CULTURE, BLOOD (ROUTINE X 2)
Culture: NO GROWTH
Culture: NO GROWTH
Special Requests: ADEQUATE

## 2020-09-05 NOTE — Progress Notes (Signed)
Diabetes Self-Management Education  Visit Type: First/Initial  Appt. Start Time: 1030 Appt. End Time: 1150  09/05/2020  [name], identified by name and date of birth, is a 60 y.o. male with a diagnosis of Diabetes: Type 2.   ASSESSMENT  Blood pressure (!) 152/76, height '6\' 2"'$  (1.88 m), weight 217 lb 8 oz (98.7 kg). Body mass index is 27.93 kg/m.   Diabetes Self-Management Education - 09/05/20 1355      Visit Information   Visit Type First/Initial      Initial Visit   Diabetes Type Type 2    Are you currently following a meal plan? Yes    What type of meal plan do you follow? "low salt, low potassium, diabetic"    Are you taking your medications as prescribed? Yes    Date Diagnosed 30+ years ago      Health Coping   How would you rate your overall health? Very Poor      Psychosocial Assessment   Patient Belief/Attitude about Diabetes Other (comment)   "it is what it is"   Self-care barriers Unsteady gait/risk for falls    Self-management support Doctor's office;Family    Patient Concerns Nutrition/Meal planning;Glycemic Control;Medication    Special Needs None    Preferred Learning Style Auditory;Visual;Hands on    [place] in progress    How often do you need to have someone help you when you read instructions, pamphlets, or other written materials from your doctor or pharmacy? 1 - Never    What is the last grade level you completed in school? college      Pre-Education Assessment   Patient understands the diabetes disease and treatment process. Demonstrates understanding / competency    Patient understands incorporating nutritional management into lifestyle. Needs Review    Patient undertands incorporating physical activity into lifestyle. Needs Review    Patient understands using medications safely. Demonstrates understanding / competency    Patient understands monitoring blood glucose, interpreting and using results Needs Review     Patient understands prevention, detection, and treatment of acute complications. Demonstrates understanding / competency    Patient understands prevention, detection, and treatment of chronic complications. Demonstrates understanding / competency    Patient understands how to develop strategies to address psychosocial issues. Needs Review    Patient understands how to develop strategies to promote health/change behavior. Demonstrates understanding / competency      Complications   Last HgB A1C per patient/outside source 14 %   07/2020   How often do you check your blood sugar? 1-2 times/day    Fasting Blood glucose range (mg/dL) 70-129;130-179;180-200;>200   pt reports FBG's vary from 100-400 mg/dL   Number of hypoglycemic episodes per month Pt reports he hasn't had a low blood sugar in years. He is able to correctly tell how to treat.   Have you had a dilated eye exam in the past 12 months? Yes    Have you had a dental exam in the past 12 months? No    Are you checking your feet? Yes    How many days per week are you checking your feet? 7      Dietary Intake   Breakfast cereal and milk; meat sandwich; toast; yogurt and bananas    Snack (morning) fruit cup; or fruit (apple, mandarin)graham crackers; applesauce; yogurt; boiled egg    Lunch left overs, meat sandich, fruit    Snack (afternoon) same as morning    Dinner beef, chicken, pork,  fish; potatoes, peas, beans, corn, green beans, occasional rice, pasta, greens, lettuce, carrots, cuccumbers, squash    Snack (evening) same as morning- usually fruit    Beverage(s) usnweetened tea or with sweetener, coffee, Sports drinks with Zero sugar, mineral water, diet soda 1-2 x week      Exercise   Exercise Type ADL's      Patient Education   Previous Diabetes Education Yes (please comment)   "multiple times"   Disease state  Explored patient's options for treatment of their diabetes    Nutrition management  Food label reading, portion sizes and  measuring food.;Carbohydrate counting;Reviewed blood glucose goals for pre and post meals and how to evaluate the patients' food intake on their blood glucose level.;Meal timing in regards to the patients' current diabetes medication.   Pt doesn't use ICR and reports he knows how much insulin based on the type and amount of food he eats. He also reports problems with esophagus and isn't always able to eat a full meal.   Physical activity and exercise  Role of exercise on diabetes management, blood pressure control and cardiac health.    Medications Taught/reviewed insulin injection, site rotation, insulin storage and needle disposal.;Reviewed patients medication for diabetes, action, purpose, timing of dose and side effects.   Pt takes Humalog after a meal due to espohageal issues and not sure how much he can eat at a time.   Monitoring Identified appropriate SMBG and/or A1C goals.;Other (comment)   Pt brought in Dexcom CGM and requested assistance in placing. He has worn this in the past. Unfortunately his sensor and transmitter had expired. He did download the [employer].   Acute complications Taught treatment of hypoglycemia - the 15 rule.    Chronic complications Nephropathy, what it is, prevention of, the use of ACE, ARB's and early detection of through urine microalbumia.    Psychosocial adjustment Identified and addressed patients feelings and concerns about diabetes      Individualized Goals (developed by patient)   Reducing Risk Other (comment)   improve blood sugars, decrease medications     Outcomes   Expected Outcomes Demonstrated interest in learning. Expect positive outcomes    Future DMSE --   He will try to set up new CGM at home and was instructed to call back and schedule an appointment if he has any issues.   Program Status Not Completed           Individualized Plan for Diabetes Self-Management Training:   Learning Objective:  Patient will have a greater  understanding of diabetes self-management. Patient education plan is to attend individual and/or group sessions per assessed needs and concerns.   Plan:   Patient Instructions  Check blood sugars 4 x day before each meal and before bed every day Bring blood sugar records MD appointment  Eat 3 meals day, 2-3  snacks a day Space meals 4-6 hours apart Include 1 serving of protein with meals and when eating fruit as a snack  Carry fast acting glucose and a snack at all times Rotate injection sites  Call back for an appointment if you need help with CGM  Expected Outcomes:  Demonstrated interest in learning. Expect positive outcomes  Education material provided:  General Meal Planning Guidelines  If problems or questions, patient to contact team via:  Johny Drilling, RN, CCM, [place] [phone]  Future DSME appointment:  PRN (He will try to set up new CGM at home and was instructed to  call back and schedule an appointment if he has any issues.)

## 2020-09-05 NOTE — Patient Instructions (Signed)
Check blood sugars 4 x day before each meal and before bed every day Bring blood sugar records MD appointment  Eat 3 meals day, 2-3  snacks a day Space meals 4-6 hours apart Include 1 serving of protein with meals and when eating fruit as a snack  Carry fast acting glucose and a snack at all times Rotate injection sites  Call back for an appointment if you need help with CGM

## 2020-10-04 ENCOUNTER — Other Ambulatory Visit: Payer: Self-pay | Admitting: Cardiovascular Disease

## 2020-10-10 ENCOUNTER — Other Ambulatory Visit: Payer: Self-pay

## 2020-10-10 NOTE — Telephone Encounter (Signed)
Per fax received from [place], prior authorization is required for Praluent '75mg'$ /mL.  Prior Auth initiated through [email or web address] Awaiting response. KEY: [id number]  Per [email or web address]: Your demographic data has been sent to [facility] successfully!  [employer] typically takes 5-10 minutes to respond, but it may take a little longer in some cases. You will be notified by email when available. You can also check for an update later by opening this request from your dashboard. Please do not fax or call [employer] to resubmit this request. If you need assistance, please chat with CoverMyMeds or call us at [phone].  If it has been longer than 24 hours, please reach out to [place].

## 2020-10-11 ENCOUNTER — Ambulatory Visit: Payer: Managed Care, Other (non HMO) | Admitting: Podiatry

## 2020-10-11 ENCOUNTER — Telehealth: Payer: Self-pay | Admitting: *Deleted

## 2020-10-11 ENCOUNTER — Encounter: Payer: Self-pay | Admitting: Podiatry

## 2020-10-11 ENCOUNTER — Other Ambulatory Visit: Payer: Self-pay

## 2020-10-11 DIAGNOSIS — E1142 Type 2 diabetes mellitus with diabetic polyneuropathy: Secondary | ICD-10-CM | POA: Diagnosis not present

## 2020-10-11 DIAGNOSIS — Z794 Long term (current) use of insulin: Secondary | ICD-10-CM

## 2020-10-11 NOTE — Telephone Encounter (Signed)
Pt requiring PA for Praluent 75 mg/ml inj. PA has been submitted via covermymeds. Awaiting approval.  Your information has been submitted to [place]. To check for an updated outcome later, reopen this PA request from your dashboard.  If [employer] has not responded to your request within 24 hours, contact [place] at [phone]. If you think there may be a problem with your PA request, use our live chat feature at the bottom right.

## 2020-10-11 NOTE — Progress Notes (Signed)
Subjective:  Patient ID: [name] [name], male    DOB: [birth year],  MRN: [id number] HPI Chief Complaint  Patient presents with  . Diabetes    Diabetic Neuropathy - patient has numbness from the knee down in both legs and feet, has tried multiple medications including gabapentin, lyrica, cymbalta, venlafaxine-experienced side effects with all, wondering if there is any other options - last a1c was 14.0 and never being lower than 9.6 in 30 years of having diabetes - "type 2 but treating as type 1"  . New Patient (Initial Visit)    60 y.o. male presents with the above complaint.   ROS: Denies fever chills nausea vomiting muscle aches.  Back pain chest pain shortness of breath.  Presents today walking with walker very unsteady gait  Past Medical History:  Diagnosis Date  . Arrhythmia   . Arthritis   . CAD (coronary artery disease)    a. 12/2016 s/p CABG x 4 (LIMA->LAD, VG->RCA, VG->OM1, VG->D1); b. 02/2018 MV: EF 35%, small-med inferolaterlal infarct. No ischemia.  . Colon polyps   . Diabetes ([place])   . Dupre's syndrome   . GERD (gastroesophageal reflux disease)   . HFimpEF (heart failure with improved ejection fraction) ([place])    a. 2018 EF 35%; b. 02/2018 EF 50%; c. 02/2019 EF 40-45%; d. 08/2019 Echo: EF 50-55%, no rwma, Gr2 DD. Nl RV size/fxn. Triv MR. Mild Ao sclerosis.  . Hyperlipidemia   . Hypertension   . Ischemic cardiomyopathy    a. 02/2018 MV: EF 35%; b. 08/2019 Echo: EF 50-55%.  . Lymphedema   . Migraine   . Neuropathy   . PAD (peripheral artery disease) ([place])    a. 04/2017 Aortoiliac duplex: R iliac dzs.  . Peripheral vascular disease ([place])   . Retinopathy   . Sleep apnea   . Stage 3 chronic kidney disease ([place])   . Stomach ulcer    Past Surgical History:  Procedure Laterality Date  . bone graft surgery    . CARDIAC CATHETERIZATION  2013   S/p PCI   . CARDIAC CATHETERIZATION  2018   S/p CABG  . CARDIAC SURGERY    . CORONARY ARTERY BYPASS GRAFT  2018    (LIMA-LAD,VG-RCA,VG-OM1,VG-D1)  . ESOPHAGOGASTRODUODENOSCOPY (EGD) WITH PROPOFOL N/A 04/04/2020   Procedure: ESOPHAGOGASTRODUODENOSCOPY (EGD) WITH PROPOFOL;  Surgeon: Lucilla Lame, MD;  Location[facility] ENDOSCOPY;  Service: Endoscopy;  Laterality: N/A;  . ESOPHAGOGASTRODUODENOSCOPY (EGD) WITH PROPOFOL N/A 06/06/2020   Procedure: ESOPHAGOGASTRODUODENOSCOPY (EGD) WITH PROPOFOL;  Surgeon: Lucilla Lame, MD;  Location: [facility] ENDOSCOPY;  Service: Endoscopy;  Laterality: N/A;  . EYE SURGERY    . PERIPHERAL VASCULAR CATHETERIZATION Right 10/28/2016   PTA/DEB Right SFA  . WRIST SURGERY      Current Outpatient Medications:  .  albuterol (ACCUNEB) 0.63 MG/3ML nebulizer solution, Inhale into the lungs., Disp: , Rfl:  .  aspirin EC 81 MG tablet, Take 81 mg by mouth daily. Swallow whole., Disp: , Rfl:  .  brimonidine (ALPHAGAN) 0.2 % ophthalmic solution, Place 1 drop into the left eye 3 (three) times daily., Disp: , Rfl:  .  carvedilol (COREG) 12.5 MG tablet, Take 12.5 mg by mouth 2 (two) times daily with a meal., Disp: , Rfl:  .  cholecalciferol (VITAMIN D3) 25 MCG (1000 UNIT) tablet, Take 2,000 Units by mouth daily., Disp: , Rfl:  .  diclofenac Sodium (VOLTAREN) 1 % GEL, Apply 2 g topically in the morning and at bedtime. To the neck muscle, Disp: 150 g, Rfl: 1 .  ezetimibe (ZETIA) 10 MG tablet, TAKE 1 TABLET BY MOUTH EVERY DAY, Disp: 90 tablet, Rfl: 1 .  fluticasone furoate-vilanterol (BREO ELLIPTA) 100-25 MCG/INH AEPB, Inhale 1 puff into the lungs daily., Disp: , Rfl:  .  insulin degludec (TRESIBA FLEXTOUCH) 200 UNIT/ML FlexTouch Pen, Inject 140 Units into the skin daily., Disp: 21 mL, Rfl: 5 .  insulin lispro (HUMALOG) 100 UNIT/ML KwikPen, Inject into the skin. 0-20 units per sliding scale, Disp: , Rfl:  .  Insulin Pen Needle (B-D ULTRAFINE III SHORT PEN) 31G X 8 MM MISC, To use with pen basal and mealtime coverage insulin pens 5 times daily. DX: E11.65, Disp: 500 each, Rfl: 1 .  ketorolac (ACULAR) 0.5 %  ophthalmic solution, SMARTSIG:1 Drop(s) Left Eye, Disp: , Rfl:  .  metoCLOPramide (REGLAN) 5 MG tablet, One tab before each meal and one at bed time for dyspepsia prn, Disp: 120 tablet, Rfl: 2 .  ofloxacin (OCUFLOX) 0.3 % ophthalmic solution, 1 drop 4 (four) times daily., Disp: , Rfl:  .  oxyCODONE-acetaminophen (PERCOCET) 5-325 MG tablet, Take 1-2 tablets by mouth every 8 (eight) hours as needed for severe pain., Disp: 20 tablet, Rfl: 0 .  pantoprazole (PROTONIX) 40 MG tablet, Take 1 tablet (40 mg total) by mouth 2 (two) times daily before a meal., Disp: 180 tablet, Rfl: 1 .  PRALUENT 75 MG/ML SOAJ, INJECT '75MG'$  INTO THE SKIN EVERY 14 DAYS., Disp: 2 mL, Rfl: 0 .  torsemide (DEMADEX) 20 MG tablet, Take 1 tablet (20 mg total) by mouth daily as needed (leg swelling)., Disp: 180 tablet, Rfl: 0  Allergies  Allergen Reactions  . Contrast Media [Iodinated Diagnostic Agents]     Kidney disease   . Other Other (See Comments)    Pain  With joint stiffness    . Statins     Pain  With joint stiffness   . Atorvastatin Other (See Comments)    Muscle aches Muscle aches  . Entresto [Sacubitril-Valsartan]     Hyperkalemia   . Pravastatin Other (See Comments)    Muscle aches Muscle aches  . Pregabalin Other (See Comments)    Generalized aches and pains Generalized aches and pains  . Rosuvastatin Other (See Comments)    Muscle Aches Other reaction(s): JOINT PAIN Other reaction(s): JOINT PAIN Muscle Aches    Review of Systems Objective:  There were no vitals filed for this visit.  General: Well developed, nourished, in no acute distress, alert and oriented x3   Dermatological: Skin is warm, dry and supple bilateral. Nails x 10 are well maintained; he most likely does have a nail dystrophy to a few of his nails but does not appear to be fungal.  Remaining integument appears unremarkable at this time. There are no open sores, no preulcerative lesions, no rash or signs of infection present.  Does  not appear to be well-hydrated.  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  Vascular: Dorsalis Pedis artery and Posterior Tibial artery pedal pulses are 1/4 right and 2/4 left  with immedate capillary fill time. Pedal hair growth present. No varicosities and no lower extremity edema present bilateral.   Neruologic: Grossly absent via light touch bilateral. Vibratory absent via tuning fork bilateral. Protective threshold with Semmes Wienstein monofilament i absent to all pedal sites bilateral. Patellar and Achilles deep tendon reflexes 2+ bilateral. No Babinski or clonus noted bilateral.   Musculoskeletal: No gross boney pedal deformities bilateral. No pain, crepitus, or limitation noted with foot and ankle range of motion  bilateral. Muscular strength 4/5 in all groups tested bilateral.  Gait: Unassisted, Nonantalgic.    Radiographs:  None taken  Assessment & Plan:   Assessment: Severe loss of sensory as well as motor function bilateral lower extremity secondary to long-term poorly controlled insulin dependent diabetes mellitus.  He also has it in his hands as well.  Plan: At this point recommend that he follow-up with Dr. Davy Pique for possible spine stem to help reduce his diabetic neuropathic pain.  This would greatly benefit his ability to walk and perform daily activities.     Gavrielle Streck T. [place], [place]

## 2020-10-18 ENCOUNTER — Other Ambulatory Visit: Payer: Self-pay | Admitting: Podiatry

## 2020-10-18 DIAGNOSIS — Z794 Long term (current) use of insulin: Secondary | ICD-10-CM

## 2020-10-18 DIAGNOSIS — E1142 Type 2 diabetes mellitus with diabetic polyneuropathy: Secondary | ICD-10-CM

## 2020-10-23 NOTE — Progress Notes (Addendum)
Cardiology Office Note  Date:  10/24/2020   ID:  Richad Deharo, DOB 1960/11/04, MRN [id number]  PCP:  [name], MD   Chief Complaint  Patient presents with  . 6 month follow up     6 month follow up. Patient c/o shortness of breath & LE edema. Medications reviewed by the patient verbally.     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), HTN,  HLD,  Stage III chronic kidney disease  GERD Former smoker quit 2013 Sleep apnea, unable to tolerate mask Statin intolerance Chronic renal insuff Who presents for routine follow-up of his coronary artery disease, history of bypass  Seen in clinic 03/2020, leg edema Was given metolazone therapy twice a week Symptoms resolved Had dramatic climbing his renal function, hypokalemia most recent renal function shows numbers are stable with potassium in normal range Now takes metolazone approximately once a week  Echo 04/2020 EF 30 to 35%  08/29/2020 in the ER n/vomiting CR 2.5, BUN 97 WBC 18, UTI Given fluids, zosyn, keflex  In the ER 08/31/2020 H/s dizzy, leg pain BP elevated Fever 101.5 CR 2.8 Cefepime, IVF ecoli UTI Told to take torsemide PRN Fluid came on quick at home  Back on torsemide 20 mg twice a day Still with leg swelling, thinks he might need an extra here and there  SBP at home 105 -140s  avg 120s  Discussed medications for cardiomyopathy On farxiga/jardiance gets "pancreatitis" Stopped hydralazine on his own, "intolerance" Might be open to try long-acting isosorbide, may have been on it before  Discussed low ejection fraction on echocardiogram 30 to 35%,, December 2021 We were not made aware as it was read by outside reader and was not addressed with him  Reports that he needs to set up with new endocrinologist Diabetes numbers continue to run poorly controlled Prior A1c 14  Echocardiogram  08/2019  normal LV function, no evidence of valve disease or elevated right heart pressures  On Repatha 75 and Zetia  EKG personally reviewed by myself on todays visit Shows normal sinus rhythm rate 69 bpm T wave abnormality V6 1 and aVL  Outside notes from [place] [facility]  initial ejection fraction 35% presumably in 2018 Ejection fraction 50% in October 2019 Notes indicating ejection fraction 40 to 45% October 2020 [place] Myoview 2019 showing small to moderate inferolateral infarction without ischemia  Notes indicate previously unable to increase Entresto secondary to high potassium level  CABG details from [place] LIMA to the LAD Vein graft to the RCA Vein graft OM1 Vein graft to D1  Statin myalgias, rheumatoid issue   PMH:   has a past medical history of Arrhythmia, Arthritis, CAD (coronary artery disease), Colon polyps, Diabetes ([place]), Dupre's syndrome, GERD (gastroesophageal reflux disease), HFimpEF (heart failure with improved ejection fraction) ([place]), Hyperlipidemia, Hypertension, Ischemic cardiomyopathy, Lymphedema, Migraine, Neuropathy, PAD (peripheral artery disease) ([place]), Peripheral vascular disease ([place]), Retinopathy, Sleep apnea, Stage 3 chronic kidney disease ([place]), and Stomach ulcer.  PSH:    Past Surgical History:  Procedure Laterality Date  . bone graft surgery    . CARDIAC CATHETERIZATION  2013   S/p PCI   . CARDIAC CATHETERIZATION  2018   S/p CABG  . CARDIAC SURGERY    . CORONARY ARTERY BYPASS GRAFT  2018   (LIMA-LAD,VG-RCA,VG-OM1,VG-D1)  . ESOPHAGOGASTRODUODENOSCOPY (EGD) WITH PROPOFOL N/A 04/04/2020   Procedure: ESOPHAGOGASTRODUODENOSCOPY (EGD) WITH PROPOFOL;  Surgeon: Lucilla Lame, MD;  Location: [facility] ENDOSCOPY;  Service: Endoscopy;  Laterality: N/A;  . ESOPHAGOGASTRODUODENOSCOPY (EGD) WITH PROPOFOL N/A 06/06/2020   Procedure: ESOPHAGOGASTRODUODENOSCOPY (EGD) WITH PROPOFOL;  Surgeon: Lucilla Lame, MD;  Location: [facility] ENDOSCOPY;  Service: Endoscopy;   Laterality: N/A;  . EYE SURGERY    . PERIPHERAL VASCULAR CATHETERIZATION Right 10/28/2016   PTA/DEB Right SFA  . WRIST SURGERY      Current Outpatient Medications  Medication Sig Dispense Refill  . albuterol (ACCUNEB) 0.63 MG/3ML nebulizer solution Inhale into the lungs.    Marland Kitchen aspirin EC 81 MG tablet Take 81 mg by mouth daily. Swallow whole.    . brimonidine (ALPHAGAN) 0.2 % ophthalmic solution Place 1 drop into the left eye 3 (three) times daily.    . carvedilol (COREG) 12.5 MG tablet Take 12.5 mg by mouth 2 (two) times daily with a meal.    . cholecalciferol (VITAMIN D3) 25 MCG (1000 UNIT) tablet Take 2,000 Units by mouth daily.    Marland Kitchen ezetimibe (ZETIA) 10 MG tablet TAKE 1 TABLET BY MOUTH EVERY DAY 90 tablet 1  . insulin degludec (TRESIBA FLEXTOUCH) 200 UNIT/ML FlexTouch Pen Inject 140 Units into the skin daily. 21 mL 5  . insulin lispro (HUMALOG) 100 UNIT/ML KwikPen Inject into the skin. 0-20 units per sliding scale    . Insulin Pen Needle (B-D ULTRAFINE III SHORT PEN) 31G X 8 MM MISC To use with pen basal and mealtime coverage insulin pens 5 times daily. DX: E11.65 500 each 1  . ketorolac (ACULAR) 0.5 % ophthalmic solution SMARTSIG:1 Drop(s) Left Eye    . metoCLOPramide (REGLAN) 5 MG tablet One tab before each meal and one at bed time for dyspepsia prn 120 tablet 2  . ofloxacin (OCUFLOX) 0.3 % ophthalmic solution 1 drop 4 (four) times daily.    Marland Kitchen oxyCODONE-acetaminophen (PERCOCET) 5-325 MG tablet Take 1-2 tablets by mouth every 8 (eight) hours as needed for severe pain. 20 tablet 0  . pantoprazole (PROTONIX) 40 MG tablet Take 1 tablet (40 mg total) by mouth 2 (two) times daily before a meal. 180 tablet 1  . PRALUENT 75 MG/ML SOAJ INJECT '75MG'$  INTO THE SKIN EVERY 14 DAYS. 2 mL 0  . torsemide (DEMADEX) 20 MG tablet Take 1 tablet (20 mg total) by mouth daily as needed (leg swelling). 180 tablet 0  . fluticasone furoate-vilanterol (BREO ELLIPTA) 100-25 MCG/INH AEPB Inhale 1 puff into the lungs  daily. (Patient not taking: Reported on 10/24/2020)     No current facility-administered medications for this visit.     Allergies:   Contrast media [iodinated diagnostic agents], Other, Statins, Atorvastatin, Entresto [sacubitril-valsartan], Pravastatin, Pregabalin, and Rosuvastatin   Social History:  The patient  reports that he quit smoking about 8 years ago. His smoking use included cigarettes. He has a 78.00 pack-year smoking history. He has never used smokeless tobacco. He reports current alcohol use. He reports that he does not use drugs.   Family History:   family history includes Diabetes in his mother; Heart disease in his father.    Review of Systems: Review of Systems  Constitutional: Negative.   HENT: Negative.   Respiratory: Negative.   Cardiovascular: Negative.   Gastrointestinal: Negative.   Musculoskeletal: Negative.        Leg weakness, sores on legs  Neurological: Negative.   Psychiatric/Behavioral: Negative.   All other systems reviewed and are negative.   PHYSICAL EXAM: VS:  BP (!) 154/82 (BP Location: Left Arm, Patient Position: Sitting, Cuff  Size: Normal)   Pulse 69   Ht '6\' 2"'$  (1.88 m)   Wt 215 lb (97.5 kg)   BMI 27.60 kg/m  , BMI Body mass index is 27.6 kg/m. Constitutional:  oriented to person, place, and time. No distress.  HENT:  Head: Grossly normal Eyes:  no discharge. No scleral icterus.  Neck: No JVD, no carotid bruits  Cardiovascular: Regular rate and rhythm, no murmurs appreciated Pulmonary/Chest: Clear to auscultation bilaterally, no wheezes or rails Abdominal: Soft.  no distension.  no tenderness.  Musculoskeletal: Normal range of motion Neurological:  normal muscle tone. Coordination normal. No atrophy Skin: Skin warm and dry Psychiatric: normal affect, pleasant  Recent Labs: 12/02/2019: TSH 1.730 05/24/2020: Magnesium 2.1 09/01/2020: ALT 26; BUN 35; Creatinine, Ser 2.63; Hemoglobin 10.7; Platelets 198; Potassium 3.3; Sodium 135     Lipid Panel Lab Results  Component Value Date   CHOL 210 (H) 12/02/2019   HDL 39 (L) 12/02/2019   LDLCALC 78 12/02/2019   TRIG 582 (HH) 12/02/2019    Wt Readings from Last 3 Encounters:  10/24/20 215 lb (97.5 kg)  09/05/20 217 lb 8 oz (98.7 kg)  09/01/20 217 lb (98.4 kg)     ASSESSMENT AND PLAN:  Problem List Items Addressed This Visit      Cardiology Problems   Essential hypertension    Other Visit Diagnoses    Coronary artery disease of native artery of native heart with stable angina pectoris ([place])    -  Primary   Ischemic cardiomyopathy       HFrEF (heart failure with reduced ejection fraction) (HCC)       Type 2 diabetes mellitus with complication, with long-term current use of insulin (HCC)       Hyperlipidemia LDL goal <70       Chronic renal impairment, stage 3b (HCC)       Myalgia due to statin         CAD with stable angina Cardiomyopathy ejection fraction read by outside reader 35%, Drop in ejection fraction from prior studies We have ordered stress test He is not very active, limited by neuropathy Abnormal EKG, unable to treadmill  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 Hemoglobin A1c greater than 14 Recommend he set up with endocrinology Long history of poorly controlled diabetes Kidney complications, followed by nephrology  Also with neuropathy Some medication noncompliance  Leg weakness long history of poorly controlled diabetes PAD, right iliac disease on study December 2018 with outside cardiology  Leg swelling Stay on torsemide 20 twice daily with extra torsemide as needed -He will need to do a better job controlling his diabetes, runs the risk of prerenal state with high sugars  Chronic kidney disease Secondary to long history of poorly controlled diabetes Edema on today's visit, torsemide 20 twice daily Ideally needs nephrology follow-up  Hyperlipidemia Continue Zetia Praluent Praluent up to 150 every 2  weeks  Very complicated gentleman with multiple medical issues and diabetes complications  Total encounter time more than 35 minutes  Greater than 50% was spent in counseling and coordination of care with the patient    Signed, Esmond Plants, M.D., Ph.D. [place], [place]

## 2020-10-24 ENCOUNTER — Ambulatory Visit: Payer: Managed Care, Other (non HMO) | Admitting: Cardiovascular Disease

## 2020-10-24 ENCOUNTER — Other Ambulatory Visit: Payer: Self-pay

## 2020-10-24 ENCOUNTER — Encounter: Payer: Self-pay | Admitting: Cardiovascular Disease

## 2020-10-24 VITALS — BP 154/82 | HR 69 | Ht 74.0 in | Wt 215.0 lb

## 2020-10-24 DIAGNOSIS — I10 Essential (primary) hypertension: Secondary | ICD-10-CM

## 2020-10-24 DIAGNOSIS — E118 Type 2 diabetes mellitus with unspecified complications: Secondary | ICD-10-CM

## 2020-10-24 DIAGNOSIS — I255 Ischemic cardiomyopathy: Secondary | ICD-10-CM | POA: Diagnosis not present

## 2020-10-24 DIAGNOSIS — N1832 Chronic kidney disease, stage 3b: Secondary | ICD-10-CM

## 2020-10-24 DIAGNOSIS — T466X5D Adverse effect of antihyperlipidemic and antiarteriosclerotic drugs, subsequent encounter: Secondary | ICD-10-CM

## 2020-10-24 DIAGNOSIS — E785 Hyperlipidemia, unspecified: Secondary | ICD-10-CM

## 2020-10-24 DIAGNOSIS — I5020 Unspecified systolic (congestive) heart failure: Secondary | ICD-10-CM | POA: Diagnosis not present

## 2020-10-24 DIAGNOSIS — I25118 Atherosclerotic heart disease of native coronary artery with other forms of angina pectoris: Secondary | ICD-10-CM

## 2020-10-24 DIAGNOSIS — I208 Other forms of angina pectoris: Secondary | ICD-10-CM

## 2020-10-24 DIAGNOSIS — M791 Myalgia, unspecified site: Secondary | ICD-10-CM

## 2020-10-24 DIAGNOSIS — Z794 Long term (current) use of insulin: Secondary | ICD-10-CM

## 2020-10-24 DIAGNOSIS — T466X5A Adverse effect of antihyperlipidemic and antiarteriosclerotic drugs, initial encounter: Secondary | ICD-10-CM

## 2020-10-24 MED ORDER — PRALUENT 150 MG/ML ~~LOC~~ SOAJ: 150.0000 mg | SUBCUTANEOUS | 48 refills | Status: DC

## 2020-10-24 MED ORDER — TORSEMIDE 20 MG PO TABS: 20.0000 mg | ORAL_TABLET | Freq: Two times a day (BID) | ORAL | 3 refills | Status: DC

## 2020-10-24 MED ORDER — PRALUENT 75 MG/ML ~~LOC~~ SOAJ: SUBCUTANEOUS | 11 refills | Status: DC

## 2020-10-24 MED ORDER — ISOSORBIDE MONONITRATE ER 30 MG PO TB24: 30.0000 mg | ORAL_TABLET | Freq: Every day | ORAL | 3 refills | Status: DC

## 2020-10-24 NOTE — Patient Instructions (Addendum)
Medication Instructions:  START  imdur/isosorbide 30 mg daily    Torsemide 20 mg twice a day  Take extra 20 mg as needed for swelling/edma    Praluent '150mg'$  Injection  Every 2 weeks   For chloesterol  If you need a refill on your cardiac medications before your next appointment, please call your pharmacy.    Lab work: No new labs needed  Testing/Procedures: Stress test/lexiscan myoview  (CAD/angina, cardiomyopathy)   We will call with results, they will upload to your MyChart  account, but you will receive a phone call explaining results.   [place]  Your caregiver has ordered a Stress Test with nuclear imaging. The purpose of this test is to evaluate the blood supply to your heart muscle. This procedure is referred to as a "Non-Invasive Stress Test." This is because other than having an IV started in your vein, nothing is inserted or "invades" your body. Cardiac stress tests are done to find areas of poor blood flow to the heart by determining the extent of coronary artery disease (CAD). Some patients exercise on a treadmill, which naturally increases the blood flow to your heart, while others who are  unable to walk on a treadmill due to physical limitations have a pharmacologic/chemical stress agent called Lexiscan . This medicine will mimic walking on a treadmill by temporarily increasing your coronary blood flow.   Please note: these test may take anywhere between 2-4 hours to complete  PLEASE REPORT TO [place] TO GO  Instructions regarding medication:   _X___ : Hold diabetes medication morning of procedure  Insulin  Take only half dose at night  _X___:  Hold betablocker(s) night before procedure and morning of procedure  Carvedilol  ____:  Hold other medications as follows:  Torsemide  Do not take morning of procedure  PLEASE NOTIFY THE OFFICE AT LEAST 24 HOURS IN ADVANCE IF  YOU ARE UNABLE TO KEEP YOUR APPOINTMENT.  [phone] AND  PLEASE NOTIFY NUCLEAR MEDICINE AT [facility] AT LEAST 24 HOURS IN ADVANCE IF YOU ARE UNABLE TO KEEP YOUR APPOINTMENT. [phone]  How to prepare for your Myoview test:  1. Do not eat or drink after midnight 2. No caffeine for 24 hours prior to test 3. No smoking 24 hours prior to test. 4. Your medication may be taken with water.  If your doctor stopped a medication because of this test, do not take that medication. 5. Wear comfortable pants. Please wear a short sleeve shirt. 6. No perfume, cologne or lotion. 7. Wear comfortable walking shoes.    Follow-Up:.  . You will need a follow up appointment in 6 months  . Providers on your designated Care Team:   . Murray Hodgkins, NP . Christell Faith, PA-C . Marrianne Mood, PA-C   COVID-19 Vaccine Information can be found at: [email or web address].uk For questions related to vaccine distribution or appointments, please email [email or web address] or call [phone].

## 2020-11-02 ENCOUNTER — Ambulatory Visit: Payer: Managed Care, Other (non HMO) | Admitting: Nurse Practitioner

## 2020-11-06 ENCOUNTER — Other Ambulatory Visit: Payer: Self-pay

## 2020-11-06 ENCOUNTER — Ambulatory Visit: Payer: Managed Care, Other (non HMO) | Admitting: Nurse Practitioner

## 2020-11-06 ENCOUNTER — Encounter: Payer: Self-pay | Admitting: Nurse Practitioner

## 2020-11-10 ENCOUNTER — Encounter: Payer: Self-pay | Admitting: Dietician

## 2020-11-10 ENCOUNTER — Encounter: Payer: Managed Care, Other (non HMO) | Attending: Endocrinology | Admitting: Dietician

## 2020-11-10 ENCOUNTER — Other Ambulatory Visit: Payer: Self-pay

## 2020-11-10 DIAGNOSIS — Z794 Long term (current) use of insulin: Secondary | ICD-10-CM | POA: Insufficient documentation

## 2020-11-10 DIAGNOSIS — E1142 Type 2 diabetes mellitus with diabetic polyneuropathy: Secondary | ICD-10-CM

## 2020-11-10 NOTE — Patient Instructions (Signed)
Pump options:   Omnipod DASH--- Eventually changes to Omnipod 5   Tubeless   Only holds 200 units of insulin  T-Slim (Tandem)   IQ technology   Holds 300 units of insulin  Medtronic   Automated adjustments   Holds 300 units of insulin  Omnipod only pump approved for type 2 diabetes Other considerations:  Insurance coverage  Could you use concentrated insulin

## 2020-11-10 NOTE — Progress Notes (Signed)
Diabetes Self-Management Education  Visit Type: Follow-up  Appt. Start Time: 1310 Appt. End Time: 1350  11/10/2020  [name], identified by name and date of birth, is a 60 y.o. male with a diagnosis of Diabetes:  .   ASSESSMENT Patient is here today with his wife.  He last saw the RN educator 09/05/2020 for diabetes nutrition education.  Type 2 diabetes (treated as type 1 diabetes for years). Patient would like to learn more about insulin pumps. He is currently working to obtain a new endocrinologist. He is trying to get a Dexcom as it is difficult to check his blood sugars due to severe neuropathy.    History includes Type 2 diabetes, GERD, HLD, HTN, CKD, CAD- CABG, OSA - unable to use C-pap, severe neuropathy requiring an electronic chair. Medications include:  Tresiba 140 units p pm, Humalog sliding scale based on blood glucose and carbohydrate intake A1C 14% 01/2020 and >14% per patient 2 months ago, eGFR 27 (09/01/2020).  Patient lives with his wife.  He is on disability and used to work in [employer], technical sales. He is originally from [place].    Height 6' 2"  (1.88 m), weight 215 lb (97.5 kg). Body mass index is 27.6 kg/m.   Diabetes Self-Management Education - 11/10/20 1420       Visit Information   Visit Type Follow-up      Psychosocial Assessment   Learning Readiness Ready      Pre-Education Assessment   Patient understands the diabetes disease and treatment process. Demonstrates understanding / competency    Patient understands incorporating nutritional management into lifestyle. Demonstrates understanding / competency    Patient undertands incorporating physical activity into lifestyle. Needs Review    Patient understands using medications safely. Needs Review    Patient understands monitoring blood glucose, interpreting and using results Needs Review    Patient understands prevention, detection, and treatment of acute complications. Needs Review    Patient understands  prevention, detection, and treatment of chronic complications. Needs Review    Patient understands how to develop strategies to address psychosocial issues. Needs Review    Patient understands how to develop strategies to promote health/change behavior. Needs Review      Complications   Last HgB A1C per patient/outside source 14 %      Patient Education   Previous Diabetes Education Yes (please comment)   4/22   Medications Reviewed patients medication for diabetes, action, purpose, timing of dose and side effects.;Other (comment)   discussed different insulin pump options     Individualized Goals (developed by patient)   Medications take my medication as prescribed    Monitoring  test my blood glucose as discussed      Post-Education Assessment   Patient understands the diabetes disease and treatment process. Demonstrates understanding / competency    Patient understands incorporating nutritional management into lifestyle. Demonstrates understanding / competency    Patient undertands incorporating physical activity into lifestyle. Demonstrates understanding / competency    Patient understands using medications safely. Demonstrates understanding / competency    Patient understands monitoring blood glucose, interpreting and using results Demonstrates understanding / competency    Patient understands prevention, detection, and treatment of acute complications. Demonstrates understanding / competency    Patient understands prevention, detection, and treatment of chronic complications. Demonstrates understanding / competency    Patient understands how to develop strategies to address psychosocial issues. Demonstrates understanding / competency    Patient understands how to develop strategies to promote health/change behavior. Demonstrates  understanding / competency      Outcomes   Expected Outcomes Demonstrated interest in learning. Expect positive outcomes    Future DMSE PRN    Program  Status Completed           Individualized Plan for Diabetes Self-Management Training:   Learning Objective:  Patient will have a greater understanding of diabetes self-management. Patient education plan is to attend individual and/or group sessions per assessed needs and concerns.   Plan:   Patient Instructions  Pump options:   Omnipod DASH--- Eventually changes to Omnipod 5   Tubeless   Only holds 200 units of insulin  T-Slim (Tandem)   IQ technology   Holds 300 units of insulin  Medtronic   Automated adjustments   Holds 300 units of insulin  Omnipod only pump approved for type 2 diabetes Other considerations:  Insurance coverage  Could you use concentrated insulin     Patient to discuss with new endocrinology, determine insurance coverage and obtain orders to proceed further.  Expected Outcomes:  Demonstrated interest in learning. Expect positive outcomes  Education material provided: brochures for T-slim and Medtronic insulin pump  If problems or questions, patient to contact team via:  Phone  Future DSME appointment: PRN

## 2020-11-13 ENCOUNTER — Other Ambulatory Visit: Payer: Self-pay

## 2020-11-13 DIAGNOSIS — Z794 Long term (current) use of insulin: Secondary | ICD-10-CM

## 2020-11-13 MED ORDER — INSULIN LISPRO (1 UNIT DIAL) 100 UNIT/ML (KWIKPEN)
20.0000 [IU] | PEN_INJECTOR | Freq: Every day | SUBCUTANEOUS | 3 refills | Status: DC
Start: 2020-11-13 — End: 2021-02-15

## 2020-11-13 MED ORDER — TRESIBA FLEXTOUCH 200 UNIT/ML ~~LOC~~ SOPN
140.0000 [IU] | PEN_INJECTOR | Freq: Every day | SUBCUTANEOUS | 5 refills | Status: DC
Start: 2020-11-13 — End: 2021-02-15

## 2020-11-14 ENCOUNTER — Other Ambulatory Visit: Payer: Self-pay

## 2020-11-14 MED ORDER — NOVOLOG FLEXPEN 100 UNIT/ML ~~LOC~~ SOPN: PEN_INJECTOR | SUBCUTANEOUS | 11 refills | Status: DC

## 2020-11-14 NOTE — Telephone Encounter (Signed)
Pharmacy called and advised that the Humalog wasn't covered on pt's insurance and insurance prefers the novolog.  Per [facility] it was ok to changed.  Spoke to [place] at [employer] and gave him the ok to change with same directions.

## 2020-11-15 ENCOUNTER — Telehealth: Payer: Self-pay

## 2020-11-15 NOTE — Telephone Encounter (Signed)
Pt returned call, he spoke to insurance comp. And to the pharmacy. He provided the pharmacy with updated insurance info and was told the Humalog was covered. Pt said to disregard the PA.

## 2020-11-15 NOTE — Telephone Encounter (Signed)
Spoke to pt regarding PA for Novalog and similar meds, per [employer] is not covering meds, I asked pt to call insurance and to get a list of preferred/covered meds and to confirm the copay for himself, and he can call us back with the list to get med approved by the provider.

## 2020-11-30 ENCOUNTER — Ambulatory Visit: Payer: Managed Care, Other (non HMO) | Admitting: Internal Medicine

## 2020-11-30 ENCOUNTER — Encounter: Payer: Self-pay | Admitting: Internal Medicine

## 2020-11-30 ENCOUNTER — Other Ambulatory Visit: Payer: Self-pay

## 2020-11-30 VITALS — BP 167/91 | HR 69 | Temp 98.2°F | Resp 16 | Ht 74.0 in | Wt 215.0 lb

## 2020-11-30 DIAGNOSIS — E1140 Type 2 diabetes mellitus with diabetic neuropathy, unspecified: Secondary | ICD-10-CM | POA: Diagnosis not present

## 2020-11-30 DIAGNOSIS — I509 Heart failure, unspecified: Secondary | ICD-10-CM

## 2020-11-30 DIAGNOSIS — B351 Tinea unguium: Secondary | ICD-10-CM | POA: Diagnosis not present

## 2020-11-30 DIAGNOSIS — L308 Other specified dermatitis: Secondary | ICD-10-CM | POA: Diagnosis not present

## 2020-11-30 DIAGNOSIS — Z794 Long term (current) use of insulin: Secondary | ICD-10-CM

## 2020-11-30 DIAGNOSIS — I739 Peripheral vascular disease, unspecified: Secondary | ICD-10-CM

## 2020-11-30 DIAGNOSIS — I10 Essential (primary) hypertension: Secondary | ICD-10-CM

## 2020-11-30 MED ORDER — BETAMETHASONE DIPROPIONATE AUG 0.05 % EX OINT: TOPICAL_OINTMENT | Freq: Two times a day (BID) | CUTANEOUS | 0 refills | Status: DC

## 2020-11-30 MED ORDER — AMLODIPINE BESYLATE 2.5 MG PO TABS
ORAL_TABLET | ORAL | 3 refills | Status: DC
Start: 2020-11-30 — End: 2020-12-07

## 2020-11-30 NOTE — Progress Notes (Signed)
[facility] [place], [place] [address]  Internal MEDICINE  Office Visit Note  Patient Name: [name] [name]  [id number]  [id number]  Date of Service: 12/06/2020  Chief Complaint  Patient presents with   Follow-up    Spot on back, bil legs, script for wheelchair, med refill, med review   Diabetes   Gastroesophageal Reflux    HPI Patient is here for acute visit. 1.  He has itchy spot on his back and would like to be checked no history of full insect bite no pain or swelling. 2.  Patient has been having problem walking due to his neuropathy and has been having frequent falls he would like to have prescription for manual wheelchair. 3.  History of diabetes followed by neurology not under optimal control at all. 4.  His blood pressure is also elevated today 5.  Like to see podiatrist for diabetic foot exam and ingrown toenails   Current Medication: Outpatient Encounter Medications as of 11/30/2020  Medication Sig   Alirocumab (PRALUENT) 150 MG/ML SOAJ Inject 150 mg into the skin every 14 (fourteen) days.   amLODipine (NORVASC) 2.5 MG tablet Take one tab at night for BP   aspirin EC 81 MG tablet Take 81 mg by mouth daily. Swallow whole.   augmented betamethasone dipropionate (DIPROLENE) 0.05 % ointment Apply topically 2 (two) times daily.   brimonidine (ALPHAGAN) 0.2 % ophthalmic solution Place 1 drop into the left eye 3 (three) times daily.   carvedilol (COREG) 12.5 MG tablet Take 12.5 mg by mouth 2 (two) times daily with a meal.   cholecalciferol (VITAMIN D3) 25 MCG (1000 UNIT) tablet Take 2,000 Units by mouth daily.   ezetimibe (ZETIA) 10 MG tablet TAKE 1 TABLET BY MOUTH EVERY DAY   insulin aspart (NOVOLOG FLEXPEN) 100 UNIT/ML FlexPen Inject 0 to 20 units under the skin daily per sliding scale   insulin degludec (TRESIBA FLEXTOUCH) 200 UNIT/ML FlexTouch Pen Inject 140 Units into the skin daily.   insulin lispro (HUMALOG) 100 UNIT/ML KwikPen Inject 20 Units  into the skin daily. 0-20 units per sliding scale   Insulin Pen Needle (B-D ULTRAFINE III SHORT PEN) 31G X 8 MM MISC To use with pen basal and mealtime coverage insulin pens 5 times daily. DX: E11.65   isosorbide mononitrate (IMDUR) 30 MG 24 hr tablet Take 1 tablet (30 mg total) by mouth daily.   ketorolac (ACULAR) 0.5 % ophthalmic solution SMARTSIG:1 Drop(s) Left Eye   metoCLOPramide (REGLAN) 5 MG tablet One tab before each meal and one at bed time for dyspepsia prn   metolazone (ZAROXOLYN) 5 MG tablet Take 5 mg by mouth as needed.   ofloxacin (OCUFLOX) 0.3 % ophthalmic solution 1 drop 4 (four) times daily.   pantoprazole (PROTONIX) 40 MG tablet Take 1 tablet (40 mg total) by mouth 2 (two) times daily before a meal.   torsemide (DEMADEX) 20 MG tablet Take 1 tablet (20 mg total) by mouth 2 (two) times daily. Take extra 20 mg as needed for swelling/edema   [DISCONTINUED] albuterol (ACCUNEB) 0.63 MG/3ML nebulizer solution Inhale into the lungs. (Patient not taking: Reported on 11/30/2020)   [DISCONTINUED] fluticasone furoate-vilanterol (BREO ELLIPTA) 100-25 MCG/INH AEPB Inhale 1 puff into the lungs daily. (Patient not taking: Reported on 11/30/2020)   [DISCONTINUED] oxyCODONE-acetaminophen (PERCOCET) 5-325 MG tablet Take 1-2 tablets by mouth every 8 (eight) hours as needed for severe pain. (Patient not taking: No sig reported)   No facility-administered encounter medications on file as of  11/30/2020.    Surgical History: Past Surgical History:  Procedure Laterality Date   bone graft surgery     CARDIAC CATHETERIZATION  2013   S/p PCI    CARDIAC CATHETERIZATION  2018   S/p CABG   CARDIAC SURGERY     CORONARY ARTERY BYPASS GRAFT  2018   (LIMA-LAD,VG-RCA,VG-OM1,VG-D1)   ESOPHAGOGASTRODUODENOSCOPY (EGD) WITH PROPOFOL N/A 04/04/2020   Procedure: ESOPHAGOGASTRODUODENOSCOPY (EGD) WITH PROPOFOL;  Surgeon: Lucilla Lame, MD;  Location: [facility] ENDOSCOPY;  Service: Endoscopy;  Laterality: N/A;    ESOPHAGOGASTRODUODENOSCOPY (EGD) WITH PROPOFOL N/A 06/06/2020   Procedure: ESOPHAGOGASTRODUODENOSCOPY (EGD) WITH PROPOFOL;  Surgeon: Lucilla Lame, MD;  Location: [facility] ENDOSCOPY;  Service: Endoscopy;  Laterality: N/A;   EYE SURGERY     PERIPHERAL VASCULAR CATHETERIZATION Right 10/28/2016   PTA/DEB Right SFA   WRIST SURGERY      Medical History: Past Medical History:  Diagnosis Date   Arrhythmia    Arthritis    CAD (coronary artery disease)    a. 12/2016 s/p CABG x 4 (LIMA->LAD, VG->RCA, VG->OM1, VG->D1); b. 02/2018 MV: EF 35%, small-med inferolaterlal infarct. No ischemia.   Colon polyps    Diabetes ([place])    Dupre's syndrome    GERD (gastroesophageal reflux disease)    HFimpEF (heart failure with improved ejection fraction) ([place])    a. 2018 EF 35%; b. 02/2018 EF 50%; c. 02/2019 EF 40-45%; d. 08/2019 Echo: EF 50-55%, no rwma, Gr2 DD. Nl RV size/fxn. Triv MR. Mild Ao sclerosis.   Hyperlipidemia    Hypertension    Ischemic cardiomyopathy    a. 02/2018 MV: EF 35%; b. 08/2019 Echo: EF 50-55%.   Lymphedema    Migraine    Neuropathy    PAD (peripheral artery disease) ([place])    a. 04/2017 Aortoiliac duplex: R iliac dzs.   Peripheral vascular disease (HCC)    Retinopathy    Sleep apnea    Stage 3 chronic kidney disease (HCC)    Stomach ulcer     Family History: Family History  Problem Relation Age of Onset   Diabetes Mother    Heart disease Father     Social History   Socioeconomic History   Marital status: Married    Spouse name: Not on file   Number of children: Not on file   Years of education: Not on file   Highest education level: Not on file  Occupational History   Not on file  Tobacco Use   Smoking status: Former    Packs/day: 2.00    Years: 39.00    Pack years: 78.00    Types: Cigarettes    Quit date: 05/18/2012    Years since quitting: 8.5   Smokeless tobacco: Never  Vaping Use   Vaping Use: Never used  Substance and Sexual Activity   Alcohol use: Yes     Comment: very rarely - wine   Drug use: Never   Sexual activity: Not on file  Other Topics Concern   Not on file  Social History Narrative   Not on file   Social Determinants of Health   Financial Resource Strain: Not on file  Food Insecurity: Not on file  Transportation Needs: Not on file  Physical Activity: Not on file  Stress: Not on file  Social Connections: Not on file  Intimate Partner Violence: Not on file      Review of Systems  Constitutional:  Negative for fatigue and fever.  HENT:  Negative for congestion, mouth sores and postnasal drip.  Respiratory:  Negative for cough.   Cardiovascular:  Negative for chest pain.  Genitourinary:  Negative for flank pain.  Musculoskeletal:  Positive for gait problem.  Skin:  Positive for rash.       Ingrown /fungal   Neurological:  Positive for weakness and numbness.  Psychiatric/Behavioral: Negative.     Vital Signs: BP (!) 167/91   Pulse 69   Temp 98.2 F (36.8 C)   Resp 16   Ht '6\' 2"'$  (1.88 m)   Wt 215 lb (97.5 kg)   SpO2 98%   BMI 27.60 kg/m    Physical Exam Constitutional:      Appearance: Normal appearance.  HENT:     Head: Normocephalic and atraumatic.     Nose: Nose normal.     Mouth/Throat:     Mouth: Mucous membranes are moist.     Pharynx: No posterior oropharyngeal erythema.  Eyes:     Extraocular Movements: Extraocular movements intact.     Pupils: Pupils are equal, round, and reactive to light.  Cardiovascular:     Pulses: Normal pulses.     Heart sounds: Normal heart sounds.  Pulmonary:     Effort: Pulmonary effort is normal.     Breath sounds: Normal breath sounds.  Skin:    Findings: Erythema and rash present.  Neurological:     Mental Status: He is alert.     Sensory: Sensory deficit present.     Motor: Weakness present.     Coordination: Coordination abnormal.     Gait: Gait abnormal.  Psychiatric:        Mood and Affect: Mood normal.        Behavior: Behavior normal.        Assessment/Plan: 1. Fungal nail infection Will be referred to dermatology for further care  - Ambulatory referral to Dermatology  2. Other eczema Add topical steroids, add topical moisturizer like Cerve  - augmented betamethasone dipropionate (DIPROLENE) 0.05 % ointment; Apply topically 2 (two) times daily.  Dispense: 30 g; Refill: 0 - Ambulatory referral to Dermatology  3. Type 2 diabetes mellitus with diabetic neuropathy, with long-term current use of insulin ([place]) Followed by Endocrinology  - DME Wheelchair manual  4. Chronic congestive heart failure, unspecified heart failure type ([place]) Followed by cardiology  - DME Wheelchair manual  5. PVD (peripheral vascular disease) ([place]) Pt follows up with vascular  - DME Wheelchair manual  6. Essential hypertension BP is elevated today, will add Norvasc 2.'5mg'$  to current medications  - amLODipine (NORVASC) 2.5 MG tablet; Take one tab at night for BP  Dispense: 90 tablet; Refill: 3   General Counseling: ilya unangst understanding of the findings of todays visit and agrees with plan of treatment. I have discussed any further diagnostic evaluation that may be needed or ordered today. We also reviewed his medications today. he has been encouraged to call the office with any questions or concerns that should arise related to todays visit.    Orders Placed This Encounter  Procedures   DME Wheelchair manual   Ambulatory referral to Dermatology    Meds ordered this encounter  Medications   augmented betamethasone dipropionate (DIPROLENE) 0.05 % ointment    Sig: Apply topically 2 (two) times daily.    Dispense:  30 g    Refill:  0   amLODipine (NORVASC) 2.5 MG tablet    Sig: Take one tab at night for BP    Dispense:  90 tablet    Refill:  3  Total time spent:30 Minutes Time spent includes review of chart, medications, test results, and follow up plan with the patient.   [place] Controlled Substance Database was  reviewed by me.   Dr [name] Internal medicine

## 2020-12-06 ENCOUNTER — Telehealth: Payer: Self-pay

## 2020-12-06 NOTE — Telephone Encounter (Signed)
Left vm to screen for 12/07/20 appointment-Toni

## 2020-12-07 ENCOUNTER — Encounter: Payer: Self-pay | Admitting: Physician Assistant

## 2020-12-07 ENCOUNTER — Other Ambulatory Visit: Payer: Self-pay

## 2020-12-07 ENCOUNTER — Ambulatory Visit (INDEPENDENT_AMBULATORY_CARE_PROVIDER_SITE_OTHER): Payer: Managed Care, Other (non HMO) | Admitting: Physician Assistant

## 2020-12-07 DIAGNOSIS — N1832 Chronic kidney disease, stage 3b: Secondary | ICD-10-CM

## 2020-12-07 DIAGNOSIS — Z0001 Encounter for general adult medical examination with abnormal findings: Secondary | ICD-10-CM

## 2020-12-07 DIAGNOSIS — I10 Essential (primary) hypertension: Secondary | ICD-10-CM

## 2020-12-07 DIAGNOSIS — Z794 Long term (current) use of insulin: Secondary | ICD-10-CM

## 2020-12-07 DIAGNOSIS — E1140 Type 2 diabetes mellitus with diabetic neuropathy, unspecified: Secondary | ICD-10-CM | POA: Diagnosis not present

## 2020-12-07 DIAGNOSIS — R5383 Other fatigue: Secondary | ICD-10-CM

## 2020-12-07 DIAGNOSIS — I509 Heart failure, unspecified: Secondary | ICD-10-CM

## 2020-12-07 DIAGNOSIS — R300 Dysuria: Secondary | ICD-10-CM

## 2020-12-07 DIAGNOSIS — G894 Chronic pain syndrome: Secondary | ICD-10-CM

## 2020-12-07 LAB — POCT GLYCOSYLATED HEMOGLOBIN (HGB A1C): Hemoglobin A1C: 13.2 % — AB (ref 4.0–5.6)

## 2020-12-07 MED ORDER — AMLODIPINE BESYLATE 5 MG PO TABS: 5.0000 mg | ORAL_TABLET | Freq: Every day | ORAL | 2 refills | Status: DC

## 2020-12-07 NOTE — Progress Notes (Signed)
[facility] [place], [place] [address]  Internal MEDICINE  Office Visit Note  Patient Name: [name] [name]  [id number]  [id number]  Date of Service: 12/11/2020  Chief Complaint  Patient presents with   Annual Exam    Discuss BP and meds   Diabetes   Gastroesophageal Reflux   Sleep Apnea   Hyperlipidemia   Hypertension     HPI Pt is here for routine health maintenance examination -BP at home: 160s/80-90; double to 59m amlodipine now -Went to Dr. MFrancoise Schaumannand states he will not go back there. Wants to go to different endocrinologist -Got referral to [facility] 4 weeks ago for endocrinology; appt in [place] on sept 1 st with endocrinologist and is the back up to [facility] -Humalog sliding scale; tresiba 140units around 6pm. Fasting: has been variable, 94-300. Takes his humalog right after eating because cant eat if he takes it right after. Sliding scale 0-20; takes 1 unit for every 20points above 120. Pt states he does not want to make any adjustments to his medications today and would like to wait until he sees new endocrinologist. -Sleeps propped up, cant do CPAP because it did not help and made it so he couldn't breathe. -Followed by nephrology as well as cardiology who has ordered stress testing -Had colonoscopy 2015 -Due for routine labs  Current Medication: Outpatient Encounter Medications as of 12/07/2020  Medication Sig   Alirocumab (PRALUENT) 150 MG/ML SOAJ Inject 150 mg into the skin every 14 (fourteen) days.   amLODipine (NORVASC) 5 MG tablet Take 1 tablet (5 mg total) by mouth daily.   aspirin EC 81 MG tablet Take 81 mg by mouth daily. Swallow whole.   augmented betamethasone dipropionate (DIPROLENE) 0.05 % ointment Apply topically 2 (two) times daily.   brimonidine (ALPHAGAN) 0.2 % ophthalmic solution Place 1 drop into the left eye 3 (three) times daily.   carvedilol (COREG) 12.5 MG tablet Take 12.5 mg by mouth 2 (two) times daily with a  meal.   cholecalciferol (VITAMIN D3) 25 MCG (1000 UNIT) tablet Take 2,000 Units by mouth daily.   ezetimibe (ZETIA) 10 MG tablet TAKE 1 TABLET BY MOUTH EVERY DAY   insulin degludec (TRESIBA FLEXTOUCH) 200 UNIT/ML FlexTouch Pen Inject 140 Units into the skin daily.   insulin lispro (HUMALOG) 100 UNIT/ML KwikPen Inject 20 Units into the skin daily. 0-20 units per sliding scale   Insulin Pen Needle (B-D ULTRAFINE III SHORT PEN) 31G X 8 MM MISC To use with pen basal and mealtime coverage insulin pens 5 times daily. DX: E11.65   isosorbide mononitrate (IMDUR) 30 MG 24 hr tablet Take 1 tablet (30 mg total) by mouth daily.   ketorolac (ACULAR) 0.5 % ophthalmic solution SMARTSIG:1 Drop(s) Left Eye   metoCLOPramide (REGLAN) 5 MG tablet One tab before each meal and one at bed time for dyspepsia prn   metolazone (ZAROXOLYN) 5 MG tablet Take 5 mg by mouth as needed.   ofloxacin (OCUFLOX) 0.3 % ophthalmic solution 1 drop 4 (four) times daily.   torsemide (DEMADEX) 20 MG tablet Take 1 tablet (20 mg total) by mouth 2 (two) times daily. Take extra 20 mg as needed for swelling/edema   [DISCONTINUED] amLODipine (NORVASC) 2.5 MG tablet Take one tab at night for BP   [DISCONTINUED] PNEUMOCOCCAL 13-VAL CONJ VACC IM Inject into the muscle.   [DISCONTINUED] insulin aspart (NOVOLOG FLEXPEN) 100 UNIT/ML FlexPen Inject 0 to 20 units under the skin daily per sliding scale (Patient not taking: Reported  on 12/07/2020)   [DISCONTINUED] pantoprazole (PROTONIX) 40 MG tablet Take 1 tablet (40 mg total) by mouth 2 (two) times daily before a meal.   No facility-administered encounter medications on file as of 12/07/2020.    Surgical History: Past Surgical History:  Procedure Laterality Date   bone graft surgery     CARDIAC CATHETERIZATION  2013   S/p PCI    CARDIAC CATHETERIZATION  2018   S/p CABG   CARDIAC SURGERY     CORONARY ARTERY BYPASS GRAFT  2018   (LIMA-LAD,VG-RCA,VG-OM1,VG-D1)   ESOPHAGOGASTRODUODENOSCOPY (EGD)  WITH PROPOFOL N/A 04/04/2020   Procedure: ESOPHAGOGASTRODUODENOSCOPY (EGD) WITH PROPOFOL;  Surgeon: Lucilla Lame, MD;  Location: [facility] ENDOSCOPY;  Service: Endoscopy;  Laterality: N/A;   ESOPHAGOGASTRODUODENOSCOPY (EGD) WITH PROPOFOL N/A 06/06/2020   Procedure: ESOPHAGOGASTRODUODENOSCOPY (EGD) WITH PROPOFOL;  Surgeon: Lucilla Lame, MD;  Location: [facility] ENDOSCOPY;  Service: Endoscopy;  Laterality: N/A;   EYE SURGERY     PERIPHERAL VASCULAR CATHETERIZATION Right 10/28/2016   PTA/DEB Right SFA   WRIST SURGERY      Medical History: Past Medical History:  Diagnosis Date   Arrhythmia    Arthritis    CAD (coronary artery disease)    a. 12/2016 s/p CABG x 4 (LIMA->LAD, VG->RCA, VG->OM1, VG->D1); b. 02/2018 MV: EF 35%, small-med inferolaterlal infarct. No ischemia.   Colon polyps    Diabetes ([place])    Dupre's syndrome    GERD (gastroesophageal reflux disease)    HFimpEF (heart failure with improved ejection fraction) ([place])    a. 2018 EF 35%; b. 02/2018 EF 50%; c. 02/2019 EF 40-45%; d. 08/2019 Echo: EF 50-55%, no rwma, Gr2 DD. Nl RV size/fxn. Triv MR. Mild Ao sclerosis.   Hyperlipidemia    Hypertension    Ischemic cardiomyopathy    a. 02/2018 MV: EF 35%; b. 08/2019 Echo: EF 50-55%.   Lymphedema    Migraine    Neuropathy    PAD (peripheral artery disease) ([place])    a. 04/2017 Aortoiliac duplex: R iliac dzs.   Peripheral vascular disease ([place])    Retinopathy    Sleep apnea    Stage 3 chronic kidney disease (HCC)    Stomach ulcer     Family History: Family History  Problem Relation Age of Onset   Diabetes Mother    Heart disease Father       Review of Systems  Constitutional:  Positive for fatigue. Negative for chills and unexpected weight change.  HENT:  Negative for congestion, postnasal drip, rhinorrhea, sneezing and sore throat.   Eyes:  Negative for redness.  Respiratory:  Positive for apnea. Negative for cough, chest tightness and shortness of breath.   Cardiovascular:  Negative  for chest pain and palpitations.  Gastrointestinal:  Negative for abdominal pain, constipation, diarrhea, nausea and vomiting.  Genitourinary:  Negative for dysuria and frequency.  Musculoskeletal:  Positive for arthralgias, back pain, gait problem, myalgias and neck pain. Negative for joint swelling.  Skin:  Negative for rash.  Neurological:  Positive for weakness, numbness and headaches. Negative for tremors.  Hematological:  Negative for adenopathy. Does not bruise/bleed easily.  Psychiatric/Behavioral:  Negative for behavioral problems (Depression) and suicidal ideas. The patient is not nervous/anxious.     Vital Signs: BP (!) 160/78   Pulse 62   Temp (!) 97 F (36.1 C)   Resp 16   Ht _0  (1.88 m)   Wt 215 lb 9.6 oz (97.8 kg)   SpO2 97%   BMI 27.68 kg/m  Physical Exam Constitutional:      General: He is not in acute distress.    Appearance: He is well-developed. He is not diaphoretic.  HENT:     Head: Normocephalic and atraumatic.     Mouth/Throat:     Pharynx: No oropharyngeal exudate.  Eyes:     Pupils: Pupils are equal, round, and reactive to light.  Neck:     Thyroid: No thyromegaly.     Vascular: No JVD.     Trachea: No tracheal deviation.  Cardiovascular:     Rate and Rhythm: Normal rate and regular rhythm.     Heart sounds: Normal heart sounds. No murmur heard.   No friction rub. No gallop.  Pulmonary:     Effort: Pulmonary effort is normal. No respiratory distress.     Breath sounds: No wheezing or rales.  Chest:     Chest wall: No tenderness.  Abdominal:     General: Bowel sounds are normal.     Palpations: Abdomen is soft.  Musculoskeletal:        General: Normal range of motion.     Cervical back: Normal range of motion and neck supple.     Comments: Chronic pain with movement  Lymphadenopathy:     Cervical: No cervical adenopathy.  Skin:    General: Skin is warm and dry.  Neurological:     Mental Status: He is alert and oriented to person,  place, and time.     Cranial Nerves: No cranial nerve deficit.     Sensory: Sensory deficit present.     Motor: Weakness present.     Gait: Gait abnormal.     Comments: In wheelchair in office today  Psychiatric:        Behavior: Behavior normal.        Thought Content: Thought content normal.        Judgment: Judgment normal.     LABS: Recent Results (from the past 2160 hour(s))  POCT HgB A1C     Status: Abnormal   Collection Time: 12/07/20  9:59 AM  Result Value Ref Range   Hemoglobin A1C 13.2 (A) 4.0 - 5.6 %   HbA1c POC (<> result, manual entry)     HbA1c, POC (prediabetic range)     HbA1c, POC (controlled diabetic range)    CULTURE, URINE COMPREHENSIVE     Status: None (Preliminary result)   Collection Time: 12/07/20 11:16 AM   Specimen: Urine   UC  Result Value Ref Range   Urine Culture, Comprehensive Preliminary report    Organism ID, Bacteria Comment     Comment: Growth insufficient.  Culture reincubated.  Microalbumin, urine     Status: None   Collection Time: 12/07/20 11:17 AM  Result Value Ref Range   Microalbumin, Urine 2,627.2 Not Estab. ug/mL    Comment: Results confirmed on dilution.   Comprehensive metabolic panel     Status: Abnormal   Collection Time: 12/07/20 11:17 AM  Result Value Ref Range   Glucose 250 (H) 65 - 99 mg/dL   BUN 37 (H) 8 - 27 mg/dL   Creatinine, Ser 2.88 (H) 0.76 - 1.27 mg/dL   eGFR 24 (L) >59 mL/min/1.73   BUN/Creatinine Ratio 13 10 - 24   Sodium 139 134 - 144 mmol/L   Potassium 4.9 3.5 - 5.2 mmol/L   Chloride 102 96 - 106 mmol/L   CO2 19 (L) 20 - 29 mmol/L   Calcium 8.7 8.6 - 10.2 mg/dL   Total  Protein 6.3 6.0 - 8.5 g/dL   Albumin 3.8 3.8 - 4.8 g/dL   Globulin, Total 2.5 1.5 - 4.5 g/dL   Albumin/Globulin Ratio 1.5 1.2 - 2.2   Bilirubin Total 0.4 0.0 - 1.2 mg/dL   Alkaline Phosphatase 83 44 - 121 IU/L   AST 26 0 - 40 IU/L   ALT 31 0 - 44 IU/L  Lipid Panel With LDL/HDL Ratio     Status: Abnormal   Collection Time: 12/07/20  11:17 AM  Result Value Ref Range   Cholesterol, Total 201 (H) 100 - 199 mg/dL   Triglycerides 381 (H) 0 - 149 mg/dL   HDL 37 (L) >39 mg/dL   VLDL Cholesterol Cal 64 (H) 5 - 40 mg/dL   LDL Chol Calc (NIH) 100 (H) 0 - 99 mg/dL   LDL/HDL Ratio 2.7 0.0 - 3.6 ratio    Comment:                                     LDL/HDL Ratio                                             Men  Women                               1/2 Avg.Risk  1.0    1.5                                   Avg.Risk  3.6    3.2                                2X Avg.Risk  6.2    5.0                                3X Avg.Risk  8.0    6.1   CBC w/Diff/Platelet     Status: Abnormal   Collection Time: 12/07/20 11:17 AM  Result Value Ref Range   WBC 8.3 3.4 - 10.8 x10E3/uL   RBC 4.08 (L) 4.14 - 5.80 x10E6/uL   Hemoglobin 12.7 (L) 13.0 - 17.7 g/dL   Hematocrit 37.0 (L) 37.5 - 51.0 %   MCV 91 79 - 97 fL   MCH 31.1 26.6 - 33.0 pg   MCHC 34.3 31.5 - 35.7 g/dL   RDW 13.8 11.6 - 15.4 %   Platelets 270 150 - 450 x10E3/uL   Neutrophils 59 Not Estab. %   Lymphs 28 Not Estab. %   Monocytes 7 Not Estab. %   Eos 4 Not Estab. %   Basos 1 Not Estab. %   Neutrophils Absolute 5.0 1.4 - 7.0 x10E3/uL   Lymphocytes Absolute 2.3 0.7 - 3.1 x10E3/uL   Monocytes Absolute 0.6 0.1 - 0.9 x10E3/uL   EOS (ABSOLUTE) 0.3 0.0 - 0.4 x10E3/uL   Basophils Absolute 0.1 0.0 - 0.2 x10E3/uL   Immature Granulocytes 1 Not Estab. %   Immature Grans (Abs) 0.0 0.0 - 0.1 x10E3/uL  TSH + free T4     Status: None  Collection Time: 12/07/20 11:17 AM  Result Value Ref Range   TSH 2.060 0.450 - 4.500 uIU/mL   Free T4 1.04 0.82 - 1.77 ng/dL        Assessment/Plan: 1. Encounter for general adult medical examination with abnormal findings CPE performed, due for routine fasting labs, colonoscopy due in 2025  2. Type 2 diabetes mellitus with diabetic neuropathy, with long-term current use of insulin (HCC) - POCT HgB A1C is 13.2 which is slightly improved from last  visit, was referred to endocrinology however pt refused to f/u with previous provider and requests new referral. Pt also declines adjusting insulin in office today until he can est with new endocrinologist. Per pt he does not tolerate oral medications. Pt educated to monitor BG closely and to monitor diet. - Microalbumin, urine - Ambulatory referral to Endocrinology  3. Essential hypertension Elevated in office, will increase to 78m norvasc and continue other medications as prescribed. Pt states he has follow up with cardiology and nephrology who also monitor BP and adjust meds as indicated. Monitor closely. - amLODipine (NORVASC) 5 MG tablet; Take 1 tablet (5 mg total) by mouth daily.  Dispense: 30 tablet; Refill: 2  4. Chronic congestive heart failure, unspecified heart failure type ([place] Followed by cardiology with plan for stress test  5. Chronic pain syndrome Followed by pain management  6. Stage 3b CKD Followed by nephrology  7. Other fatigue - Microalbumin, urine - Comprehensive metabolic panel - Lipid Panel With LDL/HDL Ratio - CBC w/Diff/Platelet - TSH + free T4  8. Dysuria - Microalbumin, urine - CULTURE, URINE COMPREHENSIVE   General Counseling: Jammon muscatellounderstanding of the findings of todays visit and agrees with plan of treatment. I have discussed any further diagnostic evaluation that may be needed or ordered today. We also reviewed his medications today. he has been encouraged to call the office with any questions or concerns that should arise related to todays visit.    Counseling:    Orders Placed This Encounter  Procedures   CULTURE, URINE COMPREHENSIVE   Microalbumin, urine   Comprehensive metabolic panel   Lipid Panel With LDL/HDL Ratio   CBC w/Diff/Platelet   TSH + free T4   Ambulatory referral to Endocrinology   POCT HgB A1C    Meds ordered this encounter  Medications   amLODipine (NORVASC) 5 MG tablet    Sig: Take 1 tablet (5 mg  total) by mouth daily.    Dispense:  30 tablet    Refill:  2    This patient was seen by LDrema Dallas PA-C in collaboration with Dr. FClayborn Bignessas a part of collaborative care agreement.  Total time spent:40 Minutes  Time spent includes review of chart, medications, test results, and follow up plan with the patient.     FLavera Guise MD  Internal Medicine

## 2020-12-08 ENCOUNTER — Other Ambulatory Visit: Payer: Self-pay

## 2020-12-08 LAB — CBC WITH DIFFERENTIAL/PLATELET
Basophils Absolute: 0.1 10*3/uL (ref 0.0–0.2)
Basos: 1 %
EOS (ABSOLUTE): 0.3 10*3/uL (ref 0.0–0.4)
Eos: 4 %
Hematocrit: 37 % — ABNORMAL LOW (ref 37.5–51.0)
Hemoglobin: 12.7 g/dL — ABNORMAL LOW (ref 13.0–17.7)
Immature Grans (Abs): 0 10*3/uL (ref 0.0–0.1)
Immature Granulocytes: 1 %
Lymphocytes Absolute: 2.3 10*3/uL (ref 0.7–3.1)
Lymphs: 28 %
MCH: 31.1 pg (ref 26.6–33.0)
MCHC: 34.3 g/dL (ref 31.5–35.7)
MCV: 91 fL (ref 79–97)
Monocytes Absolute: 0.6 10*3/uL (ref 0.1–0.9)
Monocytes: 7 %
Neutrophils Absolute: 5 10*3/uL (ref 1.4–7.0)
Neutrophils: 59 %
Platelets: 270 10*3/uL (ref 150–450)
RBC: 4.08 x10E6/uL — ABNORMAL LOW (ref 4.14–5.80)
RDW: 13.8 % (ref 11.6–15.4)
WBC: 8.3 10*3/uL (ref 3.4–10.8)

## 2020-12-08 LAB — COMPREHENSIVE METABOLIC PANEL
ALT: 31 IU/L (ref 0–44)
AST: 26 IU/L (ref 0–40)
Albumin/Globulin Ratio: 1.5 (ref 1.2–2.2)
Albumin: 3.8 g/dL (ref 3.8–4.8)
Alkaline Phosphatase: 83 IU/L (ref 44–121)
BUN/Creatinine Ratio: 13 (ref 10–24)
BUN: 37 mg/dL — ABNORMAL HIGH (ref 8–27)
Bilirubin Total: 0.4 mg/dL (ref 0.0–1.2)
CO2: 19 mmol/L — ABNORMAL LOW (ref 20–29)
Calcium: 8.7 mg/dL (ref 8.6–10.2)
Chloride: 102 mmol/L (ref 96–106)
Creatinine, Ser: 2.88 mg/dL — ABNORMAL HIGH (ref 0.76–1.27)
Globulin, Total: 2.5 g/dL (ref 1.5–4.5)
Glucose: 250 mg/dL — ABNORMAL HIGH (ref 65–99)
Potassium: 4.9 mmol/L (ref 3.5–5.2)
Sodium: 139 mmol/L (ref 134–144)
Total Protein: 6.3 g/dL (ref 6.0–8.5)
eGFR: 24 mL/min/{1.73_m2} — ABNORMAL LOW (ref >59)

## 2020-12-08 LAB — LIPID PANEL WITH LDL/HDL RATIO
Cholesterol, Total: 201 mg/dL — ABNORMAL HIGH (ref 100–199)
HDL: 37 mg/dL — ABNORMAL LOW (ref >39)
LDL Chol Calc (NIH): 100 mg/dL — ABNORMAL HIGH (ref 0–99)
LDL/HDL Ratio: 2.7 ratio (ref 0.0–3.6)
Triglycerides: 381 mg/dL — ABNORMAL HIGH (ref 0–149)
VLDL Cholesterol Cal: 64 mg/dL — ABNORMAL HIGH (ref 5–40)

## 2020-12-08 LAB — TSH+FREE T4
Free T4: 1.04 ng/dL (ref 0.82–1.77)
TSH: 2.06 u[IU]/mL (ref 0.450–4.500)

## 2020-12-08 LAB — MICROALBUMIN, URINE: Microalbumin, Urine: 2627.2 ug/mL

## 2020-12-08 MED ORDER — PANTOPRAZOLE SODIUM 40 MG PO TBEC: 40.0000 mg | DELAYED_RELEASE_TABLET | Freq: Two times a day (BID) | ORAL | 1 refills | Status: DC

## 2020-12-11 ENCOUNTER — Telehealth: Payer: Self-pay

## 2020-12-11 NOTE — Telephone Encounter (Signed)
Spoke with pt, informed him his Hemoglobin is improving, elevated cholesterol and TG--improved from last yr but to continue to work on diet, known CKD with slight worsening ofCr--pt should follow up with his nephrologist. Pt says he has an appt scheduled with them

## 2020-12-13 ENCOUNTER — Telehealth: Payer: Self-pay

## 2020-12-13 LAB — CULTURE, URINE COMPREHENSIVE

## 2020-12-13 NOTE — Telephone Encounter (Signed)
Referral faxed to [facility] per patient request. [name] [name]

## 2020-12-14 ENCOUNTER — Telehealth: Payer: Self-pay

## 2020-12-14 NOTE — Telephone Encounter (Signed)
Pt informed that referral to endo was completed on 12/13/20

## 2020-12-18 ENCOUNTER — Telehealth: Payer: Self-pay

## 2020-12-18 NOTE — Telephone Encounter (Signed)
Fax received from [employer]. Prior Auth required for Praluent '150mg'$ /mL. Pharmacy initiated PA on [email or web address]  KEY: [id number]   RESPONSE: Your demographic data has been sent to [place] successfully!  [employer] typically takes 5-10 minutes to respond, but it may take a little longer in some cases. You will be notified by email when available. You can also check for an update later by opening this request from your dashboard. Please do not fax or call [employer] to resubmit this request. If you need assistance, please chat with CoverMyMeds or call us at [phone].  If it has been longer than 24 hours, please reach out to [place].

## 2020-12-19 NOTE — Telephone Encounter (Signed)
Per [facility] [employer], PA request fro Praluent has been approved.

## 2021-01-02 ENCOUNTER — Telehealth: Payer: Self-pay

## 2021-01-02 NOTE — Telephone Encounter (Signed)
Completed medical records for [place] [employer] and Financial controller Mailed to [facility] [place]  [address] [place], [place] [address]

## 2021-01-16 ENCOUNTER — Other Ambulatory Visit: Payer: Self-pay

## 2021-01-16 ENCOUNTER — Ambulatory Visit (INDEPENDENT_AMBULATORY_CARE_PROVIDER_SITE_OTHER): Payer: Managed Care, Other (non HMO) | Admitting: Nurse Practitioner

## 2021-01-16 ENCOUNTER — Encounter: Payer: Self-pay | Admitting: Nurse Practitioner

## 2021-01-16 VITALS — BP 140/74 | HR 75 | Temp 97.2°F | Resp 16 | Ht 74.0 in | Wt 210.0 lb

## 2021-01-16 DIAGNOSIS — Z794 Long term (current) use of insulin: Secondary | ICD-10-CM | POA: Diagnosis not present

## 2021-01-16 DIAGNOSIS — E1140 Type 2 diabetes mellitus with diabetic neuropathy, unspecified: Secondary | ICD-10-CM | POA: Diagnosis not present

## 2021-01-16 DIAGNOSIS — I700 Atherosclerosis of aorta: Secondary | ICD-10-CM | POA: Diagnosis not present

## 2021-01-16 DIAGNOSIS — M5000 Cervical disc disorder with myelopathy, unspecified cervical region: Secondary | ICD-10-CM | POA: Diagnosis not present

## 2021-01-16 MED ORDER — EZETIMIBE 10 MG PO TABS: 10.0000 mg | ORAL_TABLET | Freq: Every day | ORAL | 1 refills | Status: DC

## 2021-01-16 NOTE — Progress Notes (Signed)
[facility] [place], [place] [address]  Internal MEDICINE  Office Visit Note  Patient Name: [name] [name]  [id number]  [id number]  Date of Service: 01/16/2021  Chief Complaint  Patient presents with   Follow-up    Right shoulder muscle pulled over the weekend, discuss blood sugar   Diabetes   Gastroesophageal Reflux   Sleep Apnea   Hyperlipidemia   Hypertension    HPI [name] [name] presents for a follow up visit for sleep apnea, sore right shoulder and diabetes. He is scheduled to see an endocrinologist soon. He has a history of his glucose levels and A1C being very resistive. A1C in July was 13.2.. Has tried cpap but feels like his is suffocating and not getting enough air. Cannot lay flat when he is sleeping, he sleeps at a 30 degree angle.  Is seeing a neurosurgeon at [facility] about his back problems and problems with walking. They are willing to do surgery if he can get his A1C under 8.    Current Medication: Outpatient Encounter Medications as of 01/16/2021  Medication Sig   Alirocumab (PRALUENT) 150 MG/ML SOAJ Inject 150 mg into the skin every 14 (fourteen) days.   amLODipine (NORVASC) 5 MG tablet Take 1 tablet (5 mg total) by mouth daily.   aspirin EC 81 MG tablet Take 81 mg by mouth daily. Swallow whole.   augmented betamethasone dipropionate (DIPROLENE) 0.05 % ointment Apply topically 2 (two) times daily.   carvedilol (COREG) 12.5 MG tablet Take 12.5 mg by mouth 2 (two) times daily with a meal.   cholecalciferol (VITAMIN D3) 25 MCG (1000 UNIT) tablet Take 2,000 Units by mouth daily.   insulin degludec (TRESIBA FLEXTOUCH) 200 UNIT/ML FlexTouch Pen Inject 140 Units into the skin daily.   insulin lispro (HUMALOG) 100 UNIT/ML KwikPen Inject 20 Units into the skin daily. 0-20 units per sliding scale   Insulin Pen Needle (B-D ULTRAFINE III SHORT PEN) 31G X 8 MM MISC To use with pen basal and mealtime coverage insulin pens 5 times daily. DX: E11.65   isosorbide  mononitrate (IMDUR) 30 MG 24 hr tablet Take 1 tablet (30 mg total) by mouth daily.   metoCLOPramide (REGLAN) 5 MG tablet One tab before each meal and one at bed time for dyspepsia prn   metolazone (ZAROXOLYN) 5 MG tablet Take 5 mg by mouth as needed.   nortriptyline (PAMELOR) 10 MG capsule Take 30 mg by mouth at bedtime.   pantoprazole (PROTONIX) 40 MG tablet Take 1 tablet (40 mg total) by mouth 2 (two) times daily before a meal.   torsemide (DEMADEX) 20 MG tablet Take 1 tablet (20 mg total) by mouth 2 (two) times daily. Take extra 20 mg as needed for swelling/edema   [DISCONTINUED] ezetimibe (ZETIA) 10 MG tablet TAKE 1 TABLET BY MOUTH EVERY DAY   [DISCONTINUED] nortriptyline (PAMELOR) 10 MG capsule Take 30 mg by mouth.   ezetimibe (ZETIA) 10 MG tablet Take 1 tablet (10 mg total) by mouth daily.   [DISCONTINUED] brimonidine (ALPHAGAN) 0.2 % ophthalmic solution Place 1 drop into the left eye 3 (three) times daily. (Patient not taking: Reported on 01/16/2021)   [DISCONTINUED] ketorolac (ACULAR) 0.5 % ophthalmic solution SMARTSIG:1 Drop(s) Left Eye (Patient not taking: Reported on 01/16/2021)   [DISCONTINUED] ofloxacin (OCUFLOX) 0.3 % ophthalmic solution 1 drop 4 (four) times daily. (Patient not taking: Reported on 01/16/2021)   No facility-administered encounter medications on file as of 01/16/2021.    Surgical History: Past Surgical History:  Procedure  Laterality Date   bone graft surgery     CARDIAC CATHETERIZATION  2013   S/p PCI    CARDIAC CATHETERIZATION  2018   S/p CABG   CARDIAC SURGERY     cataract surgery Bilateral    CORONARY ARTERY BYPASS GRAFT  2018   (LIMA-LAD,VG-RCA,VG-OM1,VG-D1)   ESOPHAGOGASTRODUODENOSCOPY (EGD) WITH PROPOFOL N/A 04/04/2020   Procedure: ESOPHAGOGASTRODUODENOSCOPY (EGD) WITH PROPOFOL;  Surgeon: Lucilla Lame, MD;  Location: [facility] ENDOSCOPY;  Service: Endoscopy;  Laterality: N/A;   ESOPHAGOGASTRODUODENOSCOPY (EGD) WITH PROPOFOL N/A 06/06/2020   Procedure:  ESOPHAGOGASTRODUODENOSCOPY (EGD) WITH PROPOFOL;  Surgeon: Lucilla Lame, MD;  Location: [facility] ENDOSCOPY;  Service: Endoscopy;  Laterality: N/A;   EYE SURGERY     PERIPHERAL VASCULAR CATHETERIZATION Right 10/28/2016   PTA/DEB Right SFA   WRIST SURGERY      Medical History: Past Medical History:  Diagnosis Date   Arrhythmia    Arthritis    CAD (coronary artery disease)    a. 12/2016 s/p CABG x 4 (LIMA->LAD, VG->RCA, VG->OM1, VG->D1); b. 02/2018 MV: EF 35%, small-med inferolaterlal infarct. No ischemia.   Colon polyps    Diabetes ([place])    Dupre's syndrome    GERD (gastroesophageal reflux disease)    HFimpEF (heart failure with improved ejection fraction) ([place])    a. 2018 EF 35%; b. 02/2018 EF 50%; c. 02/2019 EF 40-45%; d. 08/2019 Echo: EF 50-55%, no rwma, Gr2 DD. Nl RV size/fxn. Triv MR. Mild Ao sclerosis.   Hyperlipidemia    Hypertension    Ischemic cardiomyopathy    a. 02/2018 MV: EF 35%; b. 08/2019 Echo: EF 50-55%.   Lymphedema    Migraine    Neuropathy    PAD (peripheral artery disease) ([place])    a. 04/2017 Aortoiliac duplex: R iliac dzs.   Peripheral vascular disease (HCC)    Retinopathy    Sleep apnea    Stage 3 chronic kidney disease (HCC)    Stomach ulcer     Family History: Family History  Problem Relation Age of Onset   Diabetes Mother    Heart disease Father     Social History   Socioeconomic History   Marital status: Married    Spouse name: Not on file   Number of children: Not on file   Years of education: Not on file   Highest education level: Not on file  Occupational History   Not on file  Tobacco Use   Smoking status: Former    Packs/day: 2.00    Years: 39.00    Pack years: 78.00    Types: Cigarettes    Quit date: 05/18/2012    Years since quitting: 8.7   Smokeless tobacco: Never  Vaping Use   Vaping Use: Never used  Substance and Sexual Activity   Alcohol use: Yes    Comment: very rarely - wine   Drug use: Never   Sexual activity: Not on  file  Other Topics Concern   Not on file  Social History Narrative   Not on file   Social Determinants of Health   Financial Resource Strain: Not on file  Food Insecurity: Not on file  Transportation Needs: Not on file  Physical Activity: Not on file  Stress: Not on file  Social Connections: Not on file  Intimate Partner Violence: Not on file      Review of Systems  Constitutional:  Negative for chills, fatigue and unexpected weight change.  HENT:  Negative for congestion, rhinorrhea, sneezing and sore throat.   Eyes:  Negative for redness.  Respiratory:  Negative for cough, chest tightness and shortness of breath.   Cardiovascular:  Negative for chest pain and palpitations.  Gastrointestinal:  Negative for abdominal pain, constipation, diarrhea, nausea and vomiting.  Genitourinary:  Negative for dysuria and frequency.  Musculoskeletal:  Negative for arthralgias, back pain, joint swelling and neck pain.  Skin:  Negative for rash.  Neurological: Negative.  Negative for tremors and numbness.  Hematological:  Negative for adenopathy. Does not bruise/bleed easily.  Psychiatric/Behavioral:  Negative for behavioral problems (Depression), sleep disturbance and suicidal ideas. The patient is not nervous/anxious.    Vital Signs: BP 140/74   Pulse 75   Temp (!) 97.2 F (36.2 C)   Resp 16   Ht '6\' 2"'$  (1.88 m)   Wt 210 lb (95.3 kg)   SpO2 99%   BMI 26.96 kg/m    Physical Exam Vitals reviewed.  Constitutional:      General: He is not in acute distress.    Appearance: Normal appearance. He is not ill-appearing.  HENT:     Head: Normocephalic and atraumatic.  Eyes:     Extraocular Movements: Extraocular movements intact.     Pupils: Pupils are equal, round, and reactive to light.  Cardiovascular:     Rate and Rhythm: Normal rate and regular rhythm.  Pulmonary:     Effort: Pulmonary effort is normal. No respiratory distress.  Neurological:     Mental Status: He is alert  and oriented to person, place, and time.     Assessment/Plan: 1. Type 2 diabetes mellitus with diabetic neuropathy, with long-term current use of insulin ([place]) Scheduled to see a new endocrinoligist sooon.   2. Aortic atherosclerosis (HCC) Does not tolerate statins, refilled ezetimibe  - ezetimibe (ZETIA) 10 MG tablet; Take 1 tablet (10 mg total) by mouth daily.  Dispense: 90 tablet; Refill: 1  3. Cervical disc disease with myelopathy Needs A1C under 8 to have surgery.    General Counseling: amarante bukoski understanding of the findings of todays visit and agrees with plan of treatment. I have discussed any further diagnostic evaluation that may be needed or ordered today. We also reviewed his medications today. he has been encouraged to call the office with any questions or concerns that should arise related to todays visit.    No orders of the defined types were placed in this encounter.   Meds ordered this encounter  Medications   ezetimibe (ZETIA) 10 MG tablet    Sig: Take 1 tablet (10 mg total) by mouth daily.    Dispense:  90 tablet    Refill:  1    Return in about 3 months (around 04/18/2021) for F/U, med refill, Recheck A1C, Vincent Streater PCP.   Total time spent:30 Minutes Time spent includes review of chart, medications, test results, and follow up plan with the patient.   [place] Controlled Substance Database was reviewed by me.  This patient was seen by Jonetta Osgood, FNP-C in collaboration with Dr. [name] as a part of collaborative care agreement.   Jasmene Goswami R. Valetta Fuller, MSN, FNP-C Internal medicine

## 2021-01-23 ENCOUNTER — Telehealth: Payer: Self-pay

## 2021-01-23 NOTE — Telephone Encounter (Signed)
Completed medical records request and mailed requesting records to [place] [address] [place].

## 2021-01-30 ENCOUNTER — Encounter: Payer: Self-pay | Admitting: Internal Medicine

## 2021-01-30 ENCOUNTER — Telehealth: Payer: Managed Care, Other (non HMO) | Admitting: Internal Medicine

## 2021-01-30 VITALS — BP 151/88 | HR 79 | Temp 99.0°F | Resp 16 | Ht 74.0 in | Wt 208.8 lb

## 2021-01-30 DIAGNOSIS — U071 COVID-19: Secondary | ICD-10-CM | POA: Diagnosis not present

## 2021-01-30 NOTE — Progress Notes (Signed)
[facility] [place], [place] [address]  Internal MEDICINE  Telephone Visit  Patient Name: [name] [name]  [id number]  [id number]  Date of Service: 01/30/2021  I connected with the patient at 115 pm  by telephone and verified the patients identity using two identifiers.   I discussed the limitations, risks, security and privacy concerns of performing an evaluation and management service by telephone and the availability of in person appointments. I also discussed with the patient that there may be a patient responsible charge related to the service.  The patient expressed understanding and agrees to proceed.    Chief Complaint  Patient presents with   Acute Visit    Throat hurts, feels hot but doesn't have a fever, over all doesn't feel good, have not received covid results yet    Telephone Assessment    video   Telephone Screen    [phone]     HPI Patient is connected for acute and sick visit. His mother-in-law who lives with him and tested positive for COVID yesterday she is 46 and has been sick patient started having symptoms yesterday he is a diabetic and has multiple other medical problems including chronic kidney disease. He denies any chest congestion or shortness of breath at the moment No other acute symptoms  Current Medication: Outpatient Encounter Medications as of 01/30/2021  Medication Sig   Alirocumab (PRALUENT) 150 MG/ML SOAJ Inject 150 mg into the skin every 14 (fourteen) days.   amLODipine (NORVASC) 5 MG tablet Take 1 tablet (5 mg total) by mouth daily.   aspirin EC 81 MG tablet Take 81 mg by mouth daily. Swallow whole.   augmented betamethasone dipropionate (DIPROLENE) 0.05 % ointment Apply topically 2 (two) times daily.   carvedilol (COREG) 12.5 MG tablet Take 12.5 mg by mouth 2 (two) times daily with a meal.   cholecalciferol (VITAMIN D3) 25 MCG (1000 UNIT) tablet Take 2,000 Units by mouth daily.   ezetimibe (ZETIA) 10 MG tablet  Take 1 tablet (10 mg total) by mouth daily.   insulin degludec (TRESIBA FLEXTOUCH) 200 UNIT/ML FlexTouch Pen Inject 140 Units into the skin daily.   insulin lispro (HUMALOG) 100 UNIT/ML KwikPen Inject 20 Units into the skin daily. 0-20 units per sliding scale   Insulin Pen Needle (B-D ULTRAFINE III SHORT PEN) 31G X 8 MM MISC To use with pen basal and mealtime coverage insulin pens 5 times daily. DX: E11.65   isosorbide mononitrate (IMDUR) 30 MG 24 hr tablet Take 1 tablet (30 mg total) by mouth daily.   metoCLOPramide (REGLAN) 5 MG tablet One tab before each meal and one at bed time for dyspepsia prn   metolazone (ZAROXOLYN) 5 MG tablet Take 5 mg by mouth as needed.   nortriptyline (PAMELOR) 10 MG capsule Take 30 mg by mouth at bedtime.   pantoprazole (PROTONIX) 40 MG tablet Take 1 tablet (40 mg total) by mouth 2 (two) times daily before a meal.   torsemide (DEMADEX) 20 MG tablet Take 1 tablet (20 mg total) by mouth 2 (two) times daily. Take extra 20 mg as needed for swelling/edema   No facility-administered encounter medications on file as of 01/30/2021.    Surgical History: Past Surgical History:  Procedure Laterality Date   bone graft surgery     CARDIAC CATHETERIZATION  2013   S/p PCI    CARDIAC CATHETERIZATION  2018   S/p CABG   CARDIAC SURGERY     cataract surgery Bilateral    CORONARY  ARTERY BYPASS GRAFT  2018   (LIMA-LAD,VG-RCA,VG-OM1,VG-D1)   ESOPHAGOGASTRODUODENOSCOPY (EGD) WITH PROPOFOL N/A 04/04/2020   Procedure: ESOPHAGOGASTRODUODENOSCOPY (EGD) WITH PROPOFOL;  Surgeon: Lucilla Lame, MD;  Location: [facility] ENDOSCOPY;  Service: Endoscopy;  Laterality: N/A;   ESOPHAGOGASTRODUODENOSCOPY (EGD) WITH PROPOFOL N/A 06/06/2020   Procedure: ESOPHAGOGASTRODUODENOSCOPY (EGD) WITH PROPOFOL;  Surgeon: Lucilla Lame, MD;  Location: [facility] ENDOSCOPY;  Service: Endoscopy;  Laterality: N/A;   EYE SURGERY     PERIPHERAL VASCULAR CATHETERIZATION Right 10/28/2016   PTA/DEB Right SFA   WRIST SURGERY       Medical History: Past Medical History:  Diagnosis Date   Arrhythmia    Arthritis    CAD (coronary artery disease)    a. 12/2016 s/p CABG x 4 (LIMA->LAD, VG->RCA, VG->OM1, VG->D1); b. 02/2018 MV: EF 35%, small-med inferolaterlal infarct. No ischemia.   Colon polyps    Diabetes ([place])    Dupre's syndrome    GERD (gastroesophageal reflux disease)    HFimpEF (heart failure with improved ejection fraction) ([place])    a. 2018 EF 35%; b. 02/2018 EF 50%; c. 02/2019 EF 40-45%; d. 08/2019 Echo: EF 50-55%, no rwma, Gr2 DD. Nl RV size/fxn. Triv MR. Mild Ao sclerosis.   Hyperlipidemia    Hypertension    Ischemic cardiomyopathy    a. 02/2018 MV: EF 35%; b. 08/2019 Echo: EF 50-55%.   Lymphedema    Migraine    Neuropathy    PAD (peripheral artery disease) ([place])    a. 04/2017 Aortoiliac duplex: R iliac dzs.   Peripheral vascular disease (HCC)    Retinopathy    Sleep apnea    Stage 3 chronic kidney disease (HCC)    Stomach ulcer     Family History: Family History  Problem Relation Age of Onset   Diabetes Mother    Heart disease Father     Social History   Socioeconomic History   Marital status: Married    Spouse name: Not on file   Number of children: Not on file   Years of education: Not on file   Highest education level: Not on file  Occupational History   Not on file  Tobacco Use   Smoking status: Former    Packs/day: 2.00    Years: 39.00    Pack years: 78.00    Types: Cigarettes    Quit date: 05/18/2012    Years since quitting: 8.7   Smokeless tobacco: Never  Vaping Use   Vaping Use: Never used  Substance and Sexual Activity   Alcohol use: Yes    Comment: very rarely - wine   Drug use: Never   Sexual activity: Not on file  Other Topics Concern   Not on file  Social History Narrative   Not on file   Social Determinants of Health   Financial Resource Strain: Not on file  Food Insecurity: Not on file  Transportation Needs: Not on file  Physical Activity: Not on  file  Stress: Not on file  Social Connections: Not on file  Intimate Partner Violence: Not on file      Review of Systems  Constitutional:  Negative for fatigue and fever.  HENT:  Negative for congestion, mouth sores and postnasal drip.   Respiratory:  Negative for cough.   Cardiovascular:  Negative for chest pain.  Genitourinary:  Negative for flank pain.  Psychiatric/Behavioral: Negative.     Vital Signs: BP (!) 151/88   Pulse 79   Temp 99 F (37.2 C)   Resp 16  Ht '6\' 2"'$  (1.88 m)   Wt 208 lb 12.8 oz (94.7 kg)   SpO2 96%   BMI 26.81 kg/m    Observation/Objective: NAD, looks comfortable     Assessment/Plan: 1. COVID Patient is not having any acute symptoms or shortness of breath no cough fever or chills.  We will monitor his symptoms at home Patient is having [place] vaccination with 1 booster  General Counseling: hobson shoenfelt understanding of the findings of today's phone visit and agrees with plan of treatment. I have discussed any further diagnostic evaluation that may be needed or ordered today. We also reviewed his medications today. he has been encouraged to call the office with any questions or concerns that should arise related to todays visit.    No orders of the defined types were placed in this encounter.   No orders of the defined types were placed in this encounter.   Time spent:10 Minutes    Dr [name] Internal medicine

## 2021-02-01 ENCOUNTER — Encounter: Payer: Self-pay | Admitting: Internal Medicine

## 2021-02-01 ENCOUNTER — Telehealth: Payer: Self-pay

## 2021-02-01 NOTE — Telephone Encounter (Signed)
Completed medical records for [facility]  Payment request of $13.50 and records faxed to [phone]

## 2021-02-02 ENCOUNTER — Telehealth: Payer: Self-pay

## 2021-02-02 NOTE — Telephone Encounter (Signed)
Completed medical records for [facility] records management, mailed completed records to [address] [place] [address], faxed corrected payment request of $51.00 to [phone] request # [phone] with note informing them records will be mailed 204 pages

## 2021-02-05 ENCOUNTER — Other Ambulatory Visit: Payer: Self-pay | Admitting: Internal Medicine

## 2021-02-05 MED ORDER — ACETAMINOPHEN-CODEINE #3 300-30 MG PO TABS: ORAL_TABLET | ORAL | 0 refills | Status: DC

## 2021-02-07 ENCOUNTER — Telehealth: Payer: Self-pay

## 2021-02-07 NOTE — Telephone Encounter (Signed)
Pt's wife sent a mychart message asking for a different cough suppressant.  I sent you the message to review.  Pt's wife called at 11:44 am and asked about the message and I informed her that I sent it to you for review and we will get back to her.  Also wife mentioned that pt is at the dehydration stage and is having nausea, not eating or drinking and feels he hasn't went to bathroom in a long time, I advised wife that if patient is at that point she should take him to the hospital to get evaluated and possibly get fluids if he is dehydrated,

## 2021-02-08 ENCOUNTER — Emergency Department: Payer: Managed Care, Other (non HMO)

## 2021-02-08 ENCOUNTER — Other Ambulatory Visit: Payer: Self-pay

## 2021-02-08 ENCOUNTER — Inpatient Hospital Stay
Admission: EM | Admit: 2021-02-08 | Discharge: 2021-02-11 | DRG: 177 | Disposition: A | Discharge status: Home / Self Care | Payer: Managed Care, Other (non HMO) | Attending: Internal Medicine | Admitting: Internal Medicine

## 2021-02-08 DIAGNOSIS — Z8711 Personal history of peptic ulcer disease: Secondary | ICD-10-CM

## 2021-02-08 DIAGNOSIS — Z888 Allergy status to other drugs, medicaments and biological substances status: Secondary | ICD-10-CM

## 2021-02-08 DIAGNOSIS — I130 Hypertensive heart and chronic kidney disease with heart failure and stage 1 through stage 4 chronic kidney disease, or unspecified chronic kidney disease: Secondary | ICD-10-CM | POA: Diagnosis present

## 2021-02-08 DIAGNOSIS — E1122 Type 2 diabetes mellitus with diabetic chronic kidney disease: Secondary | ICD-10-CM | POA: Diagnosis present

## 2021-02-08 DIAGNOSIS — E871 Hypo-osmolality and hyponatremia: Secondary | ICD-10-CM | POA: Diagnosis present

## 2021-02-08 DIAGNOSIS — E860 Dehydration: Secondary | ICD-10-CM | POA: Diagnosis present

## 2021-02-08 DIAGNOSIS — E861 Hypovolemia: Secondary | ICD-10-CM | POA: Diagnosis present

## 2021-02-08 DIAGNOSIS — F32A Depression, unspecified: Secondary | ICD-10-CM | POA: Diagnosis present

## 2021-02-08 DIAGNOSIS — E1142 Type 2 diabetes mellitus with diabetic polyneuropathy: Secondary | ICD-10-CM | POA: Diagnosis present

## 2021-02-08 DIAGNOSIS — I5022 Chronic systolic (congestive) heart failure: Secondary | ICD-10-CM | POA: Diagnosis not present

## 2021-02-08 DIAGNOSIS — R778 Other specified abnormalities of plasma proteins: Secondary | ICD-10-CM

## 2021-02-08 DIAGNOSIS — Z87891 Personal history of nicotine dependence: Secondary | ICD-10-CM

## 2021-02-08 DIAGNOSIS — I214 Non-ST elevation (NSTEMI) myocardial infarction: Secondary | ICD-10-CM

## 2021-02-08 DIAGNOSIS — J449 Chronic obstructive pulmonary disease, unspecified: Secondary | ICD-10-CM | POA: Diagnosis present

## 2021-02-08 DIAGNOSIS — Z8616 Personal history of COVID-19: Secondary | ICD-10-CM

## 2021-02-08 DIAGNOSIS — Z8249 Family history of ischemic heart disease and other diseases of the circulatory system: Secondary | ICD-10-CM

## 2021-02-08 DIAGNOSIS — Z993 Dependence on wheelchair: Secondary | ICD-10-CM

## 2021-02-08 DIAGNOSIS — R062 Wheezing: Secondary | ICD-10-CM | POA: Diagnosis not present

## 2021-02-08 DIAGNOSIS — Z9114 Patient's other noncompliance with medication regimen: Secondary | ICD-10-CM

## 2021-02-08 DIAGNOSIS — E1151 Type 2 diabetes mellitus with diabetic peripheral angiopathy without gangrene: Secondary | ICD-10-CM | POA: Diagnosis present

## 2021-02-08 DIAGNOSIS — N179 Acute kidney failure, unspecified: Secondary | ICD-10-CM

## 2021-02-08 DIAGNOSIS — N189 Chronic kidney disease, unspecified: Secondary | ICD-10-CM | POA: Diagnosis not present

## 2021-02-08 DIAGNOSIS — E876 Hypokalemia: Secondary | ICD-10-CM

## 2021-02-08 DIAGNOSIS — M1990 Unspecified osteoarthritis, unspecified site: Secondary | ICD-10-CM | POA: Diagnosis present

## 2021-02-08 DIAGNOSIS — D649 Anemia, unspecified: Secondary | ICD-10-CM | POA: Diagnosis present

## 2021-02-08 DIAGNOSIS — J9601 Acute respiratory failure with hypoxia: Secondary | ICD-10-CM | POA: Diagnosis present

## 2021-02-08 DIAGNOSIS — Z9861 Coronary angioplasty status: Secondary | ICD-10-CM

## 2021-02-08 DIAGNOSIS — R7989 Other specified abnormal findings of blood chemistry: Secondary | ICD-10-CM | POA: Diagnosis not present

## 2021-02-08 DIAGNOSIS — N184 Chronic kidney disease, stage 4 (severe): Secondary | ICD-10-CM | POA: Diagnosis present

## 2021-02-08 DIAGNOSIS — I5042 Chronic combined systolic (congestive) and diastolic (congestive) heart failure: Secondary | ICD-10-CM | POA: Diagnosis present

## 2021-02-08 DIAGNOSIS — I248 Other forms of acute ischemic heart disease: Secondary | ICD-10-CM | POA: Diagnosis present

## 2021-02-08 DIAGNOSIS — G894 Chronic pain syndrome: Secondary | ICD-10-CM | POA: Diagnosis present

## 2021-02-08 DIAGNOSIS — U071 COVID-19: Principal | ICD-10-CM

## 2021-02-08 DIAGNOSIS — F419 Anxiety disorder, unspecified: Secondary | ICD-10-CM | POA: Diagnosis present

## 2021-02-08 DIAGNOSIS — R0602 Shortness of breath: Secondary | ICD-10-CM | POA: Diagnosis present

## 2021-02-08 DIAGNOSIS — E785 Hyperlipidemia, unspecified: Secondary | ICD-10-CM | POA: Diagnosis present

## 2021-02-08 DIAGNOSIS — R739 Hyperglycemia, unspecified: Secondary | ICD-10-CM

## 2021-02-08 DIAGNOSIS — Z79899 Other long term (current) drug therapy: Secondary | ICD-10-CM

## 2021-02-08 DIAGNOSIS — I255 Ischemic cardiomyopathy: Secondary | ICD-10-CM | POA: Diagnosis present

## 2021-02-08 DIAGNOSIS — Z833 Family history of diabetes mellitus: Secondary | ICD-10-CM

## 2021-02-08 DIAGNOSIS — Z794 Long term (current) use of insulin: Secondary | ICD-10-CM

## 2021-02-08 DIAGNOSIS — Z7982 Long term (current) use of aspirin: Secondary | ICD-10-CM

## 2021-02-08 DIAGNOSIS — Z951 Presence of aortocoronary bypass graft: Secondary | ICD-10-CM

## 2021-02-08 DIAGNOSIS — E1165 Type 2 diabetes mellitus with hyperglycemia: Secondary | ICD-10-CM | POA: Diagnosis not present

## 2021-02-08 DIAGNOSIS — T380X5A Adverse effect of glucocorticoids and synthetic analogues, initial encounter: Secondary | ICD-10-CM | POA: Diagnosis not present

## 2021-02-08 DIAGNOSIS — Z91041 Radiographic dye allergy status: Secondary | ICD-10-CM

## 2021-02-08 DIAGNOSIS — K219 Gastro-esophageal reflux disease without esophagitis: Secondary | ICD-10-CM | POA: Diagnosis present

## 2021-02-08 DIAGNOSIS — I10 Essential (primary) hypertension: Secondary | ICD-10-CM | POA: Diagnosis not present

## 2021-02-08 DIAGNOSIS — I2510 Atherosclerotic heart disease of native coronary artery without angina pectoris: Secondary | ICD-10-CM | POA: Diagnosis present

## 2021-02-08 DIAGNOSIS — G4733 Obstructive sleep apnea (adult) (pediatric): Secondary | ICD-10-CM | POA: Diagnosis present

## 2021-02-08 HISTORY — DX: Personal history of COVID-19: Z86.16

## 2021-02-08 HISTORY — DX: Chronic kidney disease, stage 4 (severe): N18.4

## 2021-02-08 LAB — CBC
HCT: 37.7 % — ABNORMAL LOW (ref 39.0–52.0)
Hemoglobin: 13.7 g/dL (ref 13.0–17.0)
MCH: 31.2 pg (ref 26.0–34.0)
MCHC: 36.3 g/dL — ABNORMAL HIGH (ref 30.0–36.0)
MCV: 85.9 fL (ref 80.0–100.0)
Platelets: 339 10*3/uL (ref 150–400)
RBC: 4.39 MIL/uL (ref 4.22–5.81)
RDW: 13.5 % (ref 11.5–15.5)
WBC: 12.3 10*3/uL — ABNORMAL HIGH (ref 4.0–10.5)
nRBC: 0 % (ref 0.0–0.2)

## 2021-02-08 LAB — BASIC METABOLIC PANEL
Anion gap: 19 — ABNORMAL HIGH (ref 5–15)
BUN: 59 mg/dL — ABNORMAL HIGH (ref 8–23)
CO2: 24 mmol/L (ref 22–32)
Calcium: 8.8 mg/dL — ABNORMAL LOW (ref 8.9–10.3)
Chloride: 84 mmol/L — ABNORMAL LOW (ref 98–111)
Creatinine, Ser: 3.98 mg/dL — ABNORMAL HIGH (ref 0.61–1.24)
GFR, Estimated: 16 mL/min — ABNORMAL LOW (ref >60)
Glucose, Bld: 419 mg/dL — ABNORMAL HIGH (ref 70–99)
Potassium: 3.1 mmol/L — ABNORMAL LOW (ref 3.5–5.1)
Sodium: 127 mmol/L — ABNORMAL LOW (ref 135–145)

## 2021-02-08 LAB — APTT: aPTT: 37 seconds — ABNORMAL HIGH (ref 24–36)

## 2021-02-08 LAB — RESP PANEL BY RT-PCR (FLU A&B, COVID) ARPGX2
Influenza A by PCR: NEGATIVE
Influenza B by PCR: NEGATIVE
SARS Coronavirus 2 by RT PCR: POSITIVE — AB

## 2021-02-08 LAB — GLUCOSE, CAPILLARY: Glucose-Capillary: 445 mg/dL — ABNORMAL HIGH (ref 70–99)

## 2021-02-08 LAB — TROPONIN I (HIGH SENSITIVITY)
Troponin I (High Sensitivity): 320 ng/L (ref <18)
Troponin I (High Sensitivity): 259 ng/L (ref <18)
Troponin I (High Sensitivity): 334 ng/L (ref <18)

## 2021-02-08 LAB — PROTIME-INR
INR: 1.4 — ABNORMAL HIGH (ref 0.8–1.2)
Prothrombin Time: 16.9 seconds — ABNORMAL HIGH (ref 11.4–15.2)

## 2021-02-08 LAB — D-DIMER, QUANTITATIVE: D-Dimer, Quant: 2.35 ug/mL-FEU — ABNORMAL HIGH (ref 0.00–0.50)

## 2021-02-08 MED ORDER — ONDANSETRON HCL 4 MG/2ML IJ SOLN
4.0000 mg | Freq: Four times a day (QID) | INTRAMUSCULAR | Status: DC | PRN
  Administered 2021-02-08: 4 mg via INTRAVENOUS
  Filled 2021-02-08: qty 2

## 2021-02-08 MED ORDER — VITAMIN D 25 MCG (1000 UNIT) PO TABS
2000.0000 [IU] | ORAL_TABLET | Freq: Every day | ORAL | Status: DC
  Administered 2021-02-09 – 2021-02-11 (×3): 2000 [IU] via ORAL
  Filled 2021-02-08 (×3): qty 2

## 2021-02-08 MED ORDER — IPRATROPIUM-ALBUTEROL 20-100 MCG/ACT IN AERS
1.0000 | INHALATION_SPRAY | Freq: Four times a day (QID) | RESPIRATORY_TRACT | Status: DC
  Administered 2021-02-08 – 2021-02-11 (×8): 1 via RESPIRATORY_TRACT
  Filled 2021-02-08: qty 4

## 2021-02-08 MED ORDER — INSULIN ASPART 100 UNIT/ML IJ SOLN: 0.0000 [IU] | Freq: Three times a day (TID) | INTRAMUSCULAR | Status: DC

## 2021-02-08 MED ORDER — GUAIFENESIN-DM 100-10 MG/5ML PO SYRP
5.0000 mL | ORAL_SOLUTION | ORAL | Status: DC | PRN
  Administered 2021-02-08 – 2021-02-10 (×2): 5 mL via ORAL
  Filled 2021-02-08 (×2): qty 5

## 2021-02-08 MED ORDER — GUAIFENESIN ER 600 MG PO TB12
1200.0000 mg | ORAL_TABLET | Freq: Two times a day (BID) | ORAL | Status: AC
  Administered 2021-02-08 – 2021-02-11 (×6): 1200 mg via ORAL
  Filled 2021-02-08 (×7): qty 2

## 2021-02-08 MED ORDER — INSULIN ASPART 100 UNIT/ML IJ SOLN: 0.0000 [IU] | Freq: Every day | INTRAMUSCULAR | Status: DC

## 2021-02-08 MED ORDER — ACETAMINOPHEN 325 MG PO TABS: 650.0000 mg | ORAL_TABLET | Freq: Four times a day (QID) | ORAL | Status: DC | PRN

## 2021-02-08 MED ORDER — ASPIRIN EC 81 MG PO TBEC
81.0000 mg | DELAYED_RELEASE_TABLET | Freq: Every day | ORAL | Status: DC
  Administered 2021-02-09 – 2021-02-11 (×3): 81 mg via ORAL
  Filled 2021-02-08 (×3): qty 1

## 2021-02-08 MED ORDER — ADULT MULTIVITAMIN W/MINERALS CH
1.0000 | ORAL_TABLET | Freq: Every day | ORAL | Status: DC
  Administered 2021-02-09 – 2021-02-11 (×3): 1 via ORAL
  Filled 2021-02-08 (×3): qty 1

## 2021-02-08 MED ORDER — SODIUM CHLORIDE 0.9 % IV BOLUS
1000.0000 mL | Freq: Once | INTRAVENOUS | Status: AC
  Administered 2021-02-08: 1000 mL via INTRAVENOUS

## 2021-02-08 MED ORDER — THIAMINE HCL 100 MG PO TABS
100.0000 mg | ORAL_TABLET | Freq: Every day | ORAL | Status: DC
  Administered 2021-02-09 – 2021-02-11 (×3): 100 mg via ORAL
  Filled 2021-02-08 (×3): qty 1

## 2021-02-08 MED ORDER — ZINC SULFATE 220 (50 ZN) MG PO CAPS
220.0000 mg | ORAL_CAPSULE | Freq: Every day | ORAL | Status: DC
  Administered 2021-02-08 – 2021-02-11 (×4): 220 mg via ORAL
  Filled 2021-02-08 (×4): qty 1

## 2021-02-08 MED ORDER — HYDROCOD POLST-CPM POLST ER 10-8 MG/5ML PO SUER: 5.0000 mL | Freq: Two times a day (BID) | ORAL | Status: DC | PRN

## 2021-02-08 MED ORDER — PREDNISONE 50 MG PO TABS: 50.0000 mg | ORAL_TABLET | Freq: Every day | ORAL | Status: DC

## 2021-02-08 MED ORDER — HEPARIN (PORCINE) 25000 UT/250ML-% IV SOLN
2100.0000 [IU]/h | INTRAVENOUS | Status: DC
  Administered 2021-02-08: 1500 [IU]/h via INTRAVENOUS
  Administered 2021-02-09: 1800 [IU]/h via INTRAVENOUS
  Filled 2021-02-08 (×2): qty 250

## 2021-02-08 MED ORDER — IPRATROPIUM-ALBUTEROL 0.5-2.5 (3) MG/3ML IN SOLN
3.0000 mL | Freq: Four times a day (QID) | RESPIRATORY_TRACT | Status: DC
  Administered 2021-02-08: 3 mL via RESPIRATORY_TRACT
  Filled 2021-02-08: qty 3

## 2021-02-08 MED ORDER — METHYLPREDNISOLONE SODIUM SUCC 125 MG IJ SOLR
93.0000 mg | INTRAMUSCULAR | Status: DC
  Administered 2021-02-08: 93 mg via INTRAVENOUS
  Filled 2021-02-08: qty 2

## 2021-02-08 MED ORDER — POLYETHYLENE GLYCOL 3350 17 G PO PACK: 17.0000 g | PACK | Freq: Every day | ORAL | Status: DC | PRN

## 2021-02-08 MED ORDER — CARVEDILOL 6.25 MG PO TABS
6.2500 mg | ORAL_TABLET | Freq: Two times a day (BID) | ORAL | Status: DC
  Administered 2021-02-08 – 2021-02-11 (×6): 6.25 mg via ORAL
  Filled 2021-02-08 (×6): qty 1

## 2021-02-08 MED ORDER — POTASSIUM CHLORIDE IN NACL 40-0.9 MEQ/L-% IV SOLN
INTRAVENOUS | Status: DC
  Filled 2021-02-08 (×2): qty 1000

## 2021-02-08 MED ORDER — PANTOPRAZOLE SODIUM 40 MG PO TBEC
40.0000 mg | DELAYED_RELEASE_TABLET | Freq: Two times a day (BID) | ORAL | Status: DC
  Administered 2021-02-08 – 2021-02-11 (×6): 40 mg via ORAL
  Filled 2021-02-08 (×6): qty 1

## 2021-02-08 MED ORDER — ASCORBIC ACID 500 MG PO TABS
500.0000 mg | ORAL_TABLET | Freq: Every day | ORAL | Status: DC
  Administered 2021-02-08 – 2021-02-11 (×4): 500 mg via ORAL
  Filled 2021-02-08 (×4): qty 1

## 2021-02-08 MED ORDER — NORTRIPTYLINE HCL 10 MG PO CAPS
30.0000 mg | ORAL_CAPSULE | Freq: Every day | ORAL | Status: DC
  Administered 2021-02-08 – 2021-02-10 (×3): 30 mg via ORAL
  Filled 2021-02-08 (×4): qty 3

## 2021-02-08 MED ORDER — EZETIMIBE 10 MG PO TABS
10.0000 mg | ORAL_TABLET | Freq: Every day | ORAL | Status: DC
Start: 2021-02-08 — End: 2021-02-11
  Administered 2021-02-08 – 2021-02-11 (×4): 10 mg via ORAL
  Filled 2021-02-08 (×4): qty 1

## 2021-02-08 MED ORDER — HEPARIN BOLUS VIA INFUSION
4000.0000 [IU] | Freq: Once | INTRAVENOUS | Status: DC
  Filled 2021-02-08: qty 4000

## 2021-02-08 MED ORDER — MELATONIN 5 MG PO TABS: 2.5000 mg | ORAL_TABLET | Freq: Every evening | ORAL | Status: DC | PRN

## 2021-02-08 MED ORDER — ASPIRIN 81 MG PO CHEW
324.0000 mg | CHEWABLE_TABLET | Freq: Once | ORAL | Status: AC
  Administered 2021-02-08: 324 mg via ORAL
  Filled 2021-02-08: qty 4

## 2021-02-08 MED ORDER — FOLIC ACID 1 MG PO TABS
1.0000 mg | ORAL_TABLET | Freq: Every day | ORAL | Status: DC
  Administered 2021-02-09 – 2021-02-11 (×3): 1 mg via ORAL
  Filled 2021-02-08 (×3): qty 1

## 2021-02-08 MED ORDER — AMLODIPINE BESYLATE 5 MG PO TABS
5.0000 mg | ORAL_TABLET | Freq: Every day | ORAL | Status: DC
  Administered 2021-02-09 – 2021-02-10 (×3): 5 mg via ORAL
  Filled 2021-02-08 (×3): qty 1

## 2021-02-08 MED ORDER — ISOSORBIDE MONONITRATE ER 30 MG PO TB24
30.0000 mg | ORAL_TABLET | Freq: Every day | ORAL | Status: DC
  Administered 2021-02-09 – 2021-02-11 (×4): 30 mg via ORAL
  Filled 2021-02-08 (×4): qty 1

## 2021-02-08 MED ORDER — HYDRALAZINE HCL 20 MG/ML IJ SOLN
5.0000 mg | Freq: Four times a day (QID) | INTRAMUSCULAR | Status: DC | PRN
  Administered 2021-02-10 – 2021-02-11 (×2): 5 mg via INTRAVENOUS
  Filled 2021-02-08 (×2): qty 1

## 2021-02-08 MED ORDER — INSULIN ASPART 100 UNIT/ML IJ SOLN
0.0000 [IU] | Freq: Three times a day (TID) | INTRAMUSCULAR | Status: DC
  Administered 2021-02-09: 20 [IU] via SUBCUTANEOUS
  Filled 2021-02-08: qty 1

## 2021-02-08 MED ORDER — INSULIN ASPART 100 UNIT/ML IJ SOLN
20.0000 [IU] | Freq: Once | INTRAMUSCULAR | Status: AC
  Administered 2021-02-08: 20 [IU] via SUBCUTANEOUS
  Filled 2021-02-08: qty 1

## 2021-02-08 MED ORDER — HEPARIN BOLUS VIA INFUSION
5000.0000 [IU] | Freq: Once | INTRAVENOUS | Status: AC
  Administered 2021-02-08: 5000 [IU] via INTRAVENOUS
  Filled 2021-02-08: qty 5000

## 2021-02-08 NOTE — ED Triage Notes (Signed)
Pt [facility] ED [facility] c/o being COVID positive since last Tuesday.  Pt has increase of weakness and SHOB.  88% RA, on 2L he is at 93%.  Pt is hyperglycemia at 501.  20gL wrist, 100 NaCl given.   138/76

## 2021-02-08 NOTE — Consult Note (Addendum)
ANTICOAGULATION CONSULT NOTE - Consult  Pharmacy Consult for Heparin gtt Indication: pulmonary embolus  Allergies  Allergen Reactions   Contrast Media [Iodinated Diagnostic Agents]     Kidney disease    Other Other (See Comments)    Pain  With joint stiffness     Statins     Pain  With joint stiffness    Atorvastatin Other (See Comments)    Muscle aches Muscle aches   Entresto [Sacubitril-Valsartan]     Hyperkalemia    Pravastatin Other (See Comments)    Muscle aches Muscle aches   Pregabalin Other (See Comments)    Generalized aches and pains Generalized aches and pains   Rosuvastatin Other (See Comments)    Muscle Aches Other reaction(s): JOINT PAIN Other reaction(s): JOINT PAIN Muscle Aches     Patient Measurements: Height: '6\' 2"'$  (188 cm) Weight: 94.3 kg (208 lb) IBW/kg (Calculated) : 82.2 Heparin Dosing Weight: 94.3kg  Vital Signs: Temp: 99.2 F (37.3 C) (09/15 1446) Temp Source: Oral (09/15 1446) BP: 148/87 (09/15 1446) Pulse Rate: 91 (09/15 1610)  Labs: Recent Labs    02/08/21 1455  HGB 13.7  HCT 37.7*  PLT 339  CREATININE 3.98*  TROPONINIHS 320*    Estimated Creatinine Clearance: 22.7 mL/min (A) (by C-G formula based on SCr of 3.98 mg/dL (H)).   Medications: No AC/APT pertinent med allergies PTA: Not on AC. 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 Pharmacy consulted for the mgmt of heparin gtt d/t c/f PE.  Date Time aPTT/HL Rate/Comment       Baseline Labs: aPTT - ordered INR - ordered Hgb - 13.7 Plts - 37.7  Goal of Therapy:  Heparin level 0.3-0.7 units/ml Monitor platelets by anticoagulation protocol: Yes   Plan:  C/f PE, heparin prophylactic. Troponins elevated, initial CXR  negative, will f/u VQ scan, d-dimer, and LE [place] to r/o DVT. Give 5000 units bolus x1  Start heparin infusion at 1500 units/hr Check anti-Xa level in 8 hours and daily while on heparin Continue to monitor H&H and platelets  Brayton Layman, BCCP 02/08/2021,4:34 PM

## 2021-02-08 NOTE — ED Triage Notes (Signed)
Pt arrives via [facility] with CC of CP (reproducible with cough) and shortness of breath. Pt had positive COVID test on 9/5. Triage SpO2 on RA 91%. Pt placed on 2L [place] and SpO2 increased to 94%.

## 2021-02-08 NOTE — H&P (Signed)
History and Physical  Sharone Agne [id number] DOB: [birth year] DOA: 02/08/2021  Referring physician: Dr. Jari Pigg, [place]. PCP: [name], MD  Outpatient Specialists: Pulmonary, endocrinology, nephrology. Patient coming from: Home.  Chief Complaint: Persistent cough, shortness of breath.  HPI: [name] [name] is a 60 y.o. 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  Associated with nausea and poor oral intake.  Due to concern for dehydration his wife called his pulmonologist who recommended that he comes to the ED for further evaluation.  Upon presentation to the ED, he is ill-appearing.  Work-up is concerning for possible pulmonary embolism, hypoxic, elevated troponin, with no radiographic evidence of acute cardiopulmonary process on chest x-ray.  He was started on heparin drip in the ED by EDP.  TRH asked to admit for further work-up and management of present condition.  ED Course: Temperature 99.2.  BP 148/87, pulse 92, respiration rate 23, with saturation 91% on room air.  Lab studies remarkable for WBC 12.3.  Serum sodium 127, potassium 3.1, serum bicarb 24, glucose 419, BUN 59, creatinine 3.98 from 2.88.  Troponin 320.  Review of Systems: Review of systems as noted in the HPI. All other systems reviewed and are negative.   Past Medical History:  Diagnosis Date   Arrhythmia    Arthritis    CAD (coronary artery disease)    a. 12/2016 s/p CABG x 4 (LIMA->LAD, VG->RCA, VG->OM1, VG->D1); b. 02/2018 MV: EF 35%, small-med inferolaterlal infarct. No ischemia.   Colon polyps    Diabetes ([place])    Dupre's syndrome    GERD (gastroesophageal reflux disease)    HFimpEF (heart failure with improved ejection fraction) ([place])    a. 2018 EF 35%; b. 02/2018 EF 50%; c. 02/2019 EF 40-45%; d.  08/2019 Echo: EF 50-55%, no rwma, Gr2 DD. Nl RV size/fxn. Triv MR. Mild Ao sclerosis.   Hyperlipidemia    Hypertension    Ischemic cardiomyopathy    a. 02/2018 MV: EF 35%; b. 08/2019 Echo: EF 50-55%.   Lymphedema    Migraine    Neuropathy    PAD (peripheral artery disease) ([place])    a. 04/2017 Aortoiliac duplex: R iliac dzs.   Peripheral vascular disease ([place])    Retinopathy    Sleep apnea    Stage 3 chronic kidney disease ([place])    Stomach ulcer    Past Surgical History:  Procedure Laterality Date   bone graft surgery     CARDIAC CATHETERIZATION  2013   S/p PCI    CARDIAC CATHETERIZATION  2018   S/p CABG   CARDIAC SURGERY     cataract surgery Bilateral    CORONARY ARTERY BYPASS GRAFT  2018   (LIMA-LAD,VG-RCA,VG-OM1,VG-D1)   ESOPHAGOGASTRODUODENOSCOPY (EGD) WITH PROPOFOL N/A 04/04/2020   Procedure: ESOPHAGOGASTRODUODENOSCOPY (EGD) WITH PROPOFOL;  Surgeon: Lucilla Lame, MD;  Location: [facility] ENDOSCOPY;  Service: Endoscopy;  Laterality: N/A;   ESOPHAGOGASTRODUODENOSCOPY (EGD) WITH PROPOFOL N/A 06/06/2020   Procedure: ESOPHAGOGASTRODUODENOSCOPY (EGD) WITH PROPOFOL;  Surgeon: Lucilla Lame, MD;  Location: [facility] ENDOSCOPY;  Service: Endoscopy;  Laterality: N/A;   EYE SURGERY     PERIPHERAL VASCULAR CATHETERIZATION Right 10/28/2016   PTA/DEB Right SFA   WRIST SURGERY      Social History:  reports that he quit smoking about 8 years ago. His smoking use included cigarettes. He has a 78.00 pack-year smoking history. He  has never used smokeless tobacco. He reports current alcohol use. He reports that he does not use drugs.   Allergies  Allergen Reactions   Contrast Media [Iodinated Diagnostic Agents]     Kidney disease    Other Other (See Comments)    Pain  With joint stiffness     Statins     Pain  With joint stiffness    Atorvastatin Other (See Comments)    Muscle aches Muscle aches   Entresto [Sacubitril-Valsartan]     Hyperkalemia    Pravastatin Other (See Comments)     Muscle aches Muscle aches   Pregabalin Other (See Comments)    Generalized aches and pains Generalized aches and pains   Rosuvastatin Other (See Comments)    Muscle Aches Other reaction(s): JOINT PAIN Other reaction(s): JOINT PAIN Muscle Aches     Family History  Problem Relation Age of Onset   Diabetes Mother    Heart disease Father       Prior to Admission medications   Medication Sig Start Date End Date Taking? Authorizing Provider  acetaminophen-codeine (TYLENOL #3) 300-30 MG tablet TAKE ONE TAB PO TID FOR COUGH 02/05/21   [name], MD  Alirocumab (PRALUENT) 150 MG/ML SOAJ Inject 150 mg into the skin every 14 (fourteen) days. 10/24/20   Minna Merritts, MD  amLODipine (NORVASC) 5 MG tablet Take 1 tablet (5 mg total) by mouth daily. 12/07/20   McDonough, Si Gaul, PA-C  aspirin EC 81 MG tablet Take 81 mg by mouth daily. Swallow whole.    [provider]  augmented betamethasone dipropionate (DIPROLENE) 0.05 % ointment Apply topically 2 (two) times daily. 11/30/20   [name], MD  carvedilol (COREG) 12.5 MG tablet Take 12.5 mg by mouth 2 (two) times daily with a meal.    [provider]  cholecalciferol (VITAMIN D3) 25 MCG (1000 UNIT) tablet Take 2,000 Units by mouth daily.    [provider]  ezetimibe (ZETIA) 10 MG tablet Take 1 tablet (10 mg total) by mouth daily. 01/16/21   Jonetta Osgood, NP  insulin degludec (TRESIBA FLEXTOUCH) 200 UNIT/ML FlexTouch Pen Inject 140 Units into the skin daily. 11/13/20   [name], MD  insulin lispro (HUMALOG) 100 UNIT/ML KwikPen Inject 20 Units into the skin daily. 0-20 units per sliding scale 11/13/20   [name], MD  Insulin Pen Needle (B-D ULTRAFINE III SHORT PEN) 31G X 8 MM MISC To use with pen basal and mealtime coverage insulin pens 5 times daily. DX: E11.65 04/17/20   Ronnell Freshwater, NP  isosorbide mononitrate (IMDUR) 30 MG 24 hr tablet Take 1 tablet (30 mg total) by mouth daily. 10/24/20    Minna Merritts, MD  metoCLOPramide (REGLAN) 5 MG tablet One tab before each meal and one at bed time for dyspepsia prn 01/19/20   [name], MD  metolazone (ZAROXOLYN) 5 MG tablet Take 5 mg by mouth as needed.    [provider]  nortriptyline (PAMELOR) 10 MG capsule Take 30 mg by mouth at bedtime.    [provider]  pantoprazole (PROTONIX) 40 MG tablet Take 1 tablet (40 mg total) by mouth 2 (two) times daily before a meal. 12/08/20 03/08/21  McDonough, Si Gaul, PA-C  torsemide (DEMADEX) 20 MG tablet Take 1 tablet (20 mg total) by mouth 2 (two) times daily. Take extra 20 mg as needed for swelling/edema 10/24/20 01/22/21  Minna Merritts, MD    Physical Exam: BP Marland Kitchen)  148/87 (BP Location: Left Arm)   Pulse 91   Temp 99.2 F (37.3 C) (Oral)   Resp (!) 31   Ht '6\' 2"'$  (1.88 m)   Wt 94.3 kg   SpO2 97%   BMI 26.71 kg/m   General: 60 y.o. year-old male well developed well nourished in no acute distress.  Alert and oriented x3. Cardiovascular: Regular rate and rhythm with no rubs or gallops.  No thyromegaly or JVD noted.  No lower extremity edema. 2/4 pulses in all 4 extremities. Respiratory: Diffuse wheezing bilaterally.  Mild rales at bases. Good inspiratory effort. Abdomen: Soft nontender nondistended with normal bowel sounds x4 quadrants. Muskuloskeletal: No cyanosis, clubbing or edema noted bilaterally Neuro: CN II-XII intact, strength, sensation, reflexes Skin: No ulcerative lesions noted or rashes Psychiatry: Judgement and insight appear normal. Mood is appropriate for condition and setting          Labs on Admission:  Basic Metabolic Panel: Recent Labs  Lab 02/08/21 1455  NA 127*  K 3.1*  CL 84*  CO2 24  GLUCOSE 419*  BUN 59*  CREATININE 3.98*  CALCIUM 8.8*   Liver Function Tests: No results for input(s): AST, ALT, ALKPHOS, BILITOT, PROT, ALBUMIN in the last 168 hours. No results for input(s): LIPASE, AMYLASE in the last 168 hours. No results for  input(s): AMMONIA in the last 168 hours. CBC: Recent Labs  Lab 02/08/21 1455  WBC 12.3*  HGB 13.7  HCT 37.7*  MCV 85.9  PLT 339   Cardiac Enzymes: No results for input(s): CKTOTAL, CKMB, CKMBINDEX, TROPONINI in the last 168 hours.  BNP (last 3 results) No results for input(s): BNP in the last 8760 hours.  ProBNP (last 3 results) No results for input(s): PROBNP in the last 8760 hours.  CBG: No results for input(s): GLUCAP in the last 168 hours.  Radiological Exams on Admission: DG Chest 2 View  Result Date: 02/08/2021 CLINICAL DATA:  Chest pain EXAM: CHEST - 2 VIEW COMPARISON:  Chest radiograph 08/31/2020 FINDINGS: Median sternotomy wires are stable. The cardiomediastinal silhouette is stable. There is unchanged asymmetric elevation of the right hemidiaphragm. Linear opacities in the left base likely reflect atelectasis and/or scar. There is no focal consolidation. There is no pulmonary edema. There is no pleural effusion or pneumothorax. There is no acute osseous abnormality. IMPRESSION: No radiographic evidence of acute cardiopulmonary process. Electronically Signed   By: Valetta Mole M.D.   On: 02/08/2021 16:00    EKG: I independently viewed the EKG done and my findings are as followed: Sinus rhythm rate of 91.  Nonspecific ST-T changes.  QTc 467.  Assessment/Plan Present on Admission:  Acute respiratory failure with hypoxia (HCC)  Active Problems:   Acute respiratory failure with hypoxia (HCC)  Acute hypoxic respiratory failure unclear etiology however suspect related to COVID-19 viral infection. Not on oxygen supplementation at baseline Currently requiring 2 L to maintain saturation greater than 92%  Start IV Solu-Medrol and bronchodilators, incentive spirometer. Antitussives as needed. VQ scan ordered by EDP to rule out pulmonary embolism. Positive troponin greater than 300, started on heparin drip by EDP, continue until ACS and pulmonary embolisms are ruled out.    Closely monitor  Elevated troponin, suspect demand ischemia in the setting of hypoxia. Initial troponin 320 Cycle troponin x2 Started on heparin drip by EDP, continue. Obtain 2D echo  COVID-19 viral infection, initial diagnosis on 01/30/2021. Follows with pulmonary outpatient No evidence of lobular infiltrates on chest x-ray. Check inflammatory markers Incentive spirometer, mobilize  as tolerated Multivitamins, vitamin C and zinc. Maintain O2 saturation greater than 92%.  Type 2 diabetes with hyperglycemia Hemoglobin A1c 13.2 in July 2022. Start insulin sliding scale.  Leukocytosis, rule out active infective process. Procalcitonin pending Presented with WBC 12.3 Repeat CBC in the morning Monitor fever curve and WBC.  Hypovolemic hyponatremia secondary to dehydration and poor oral intake Presented with serum sodium of 127 He received normal saline 1 L bolus in the ED, continue, judiciously and encourage oral intake. Normal saline KCl 40 mEq 50 cc/h x 1 day. Hold off home torsemide for now  Chronic combined diastolic and systolic CHF Last 2D echo done on 05/17/2020 showed LVEF 30 to 35% Left ventricle demonstrated global hypokinesis.  Grade 2 diastolic dysfunction. Hypovolemic on exam Closely monitor volume status while on IV fluid Start strict I's and O's and daily weights  Hypokalemia Serum potassium 3.1 Repleted intravenously Obtain magnesium level in the morning. Replete as indicated.  Chronic anxiety/depression Resume home nortriptyline  Hyperlipidemia Resume home Zetia  Hypertension Resume home oral antihypertensive Hold off home torsemide.   DVT prophylaxis: Subcu Lovenox daily.  Code Status: Full code  Family Communication: None at bedside.  Disposition Plan: Admitted to progressive cardiac unit  Consults called: None  Admission status: Inpatient status.  Patient will require at least 2 midnights for further evaluation and treatment of present  condition.   Status is: Inpatient    Dispo: The patient is from: Home.              Anticipated d/c is to: Home.              Patient currently not medically stable   Difficult to place patient, not applicable.       Kayleen Memos MD [facility] Pager [phone]  If 7PM-7AM, please contact night-coverage [email or web address] Password [email or web address]  02/08/2021, 4:35 PM

## 2021-02-08 NOTE — ED Provider Notes (Signed)
[facility] Emergency Department Provider Note  ____________________________________________   Event Date/Time   First MD Initiated Contact with Patient 02/08/21 1600     (approximate)  I have reviewed the triage vital signs and the nursing notes.   HISTORY  Chief Complaint Chest Pain    HPI [name] [name] is a 60 y.o. male who has a history of coronary disease and was COVID-positive last Tuesday who comes in with increased weakness and shortness of breath.  Patient was 88% on room air and placed on 2 L.  Patient states that he is not been feeling well since last Monday or Tuesday.  He has not been on any treatments due to his CKD.  He states that he came in today for worsening coughing which is causing some chest pain and shortness of breath.  States has not been able to eat as well just due to the coughing.  Symptoms have been constant, nothing makes them better or worse.  He reports a little bit of swelling in his legs that is chronic in nature.          Past Medical History:  Diagnosis Date   Arrhythmia    Arthritis    CAD (coronary artery disease)    a. 12/2016 s/p CABG x 4 (LIMA->LAD, VG->RCA, VG->OM1, VG->D1); b. 02/2018 MV: EF 35%, small-med inferolaterlal infarct. No ischemia.   Colon polyps    Diabetes ([place])    Dupre's syndrome    GERD (gastroesophageal reflux disease)    HFimpEF (heart failure with improved ejection fraction) ([place])    a. 2018 EF 35%; b. 02/2018 EF 50%; c. 02/2019 EF 40-45%; d. 08/2019 Echo: EF 50-55%, no rwma, Gr2 DD. Nl RV size/fxn. Triv MR. Mild Ao sclerosis.   Hyperlipidemia    Hypertension    Ischemic cardiomyopathy    a. 02/2018 MV: EF 35%; b. 08/2019 Echo: EF 50-55%.   Lymphedema    Migraine    Neuropathy    PAD (peripheral artery disease) ([place])    a. 04/2017 Aortoiliac duplex: R iliac dzs.   Peripheral vascular disease ([place])    Retinopathy    Sleep apnea    Stage 3 chronic kidney disease ([place])    Stomach  ulcer     Patient Active Problem List   Diagnosis Date Noted   Complicated UTI (urinary tract infection) 08/31/2020   Barrett's esophagus with low grade dysplasia    Diabetes mellitus with stage 4 chronic kidney disease GFR 15-29 ([place]) 06/01/2020   Serum albumin decreased 06/01/2020   Vitamin D deficiency 06/01/2020   Elevated hemoglobin A1c 06/01/2020   Elevated sed rate 06/01/2020   Elevated brain natriuretic peptide (BNP) level 06/01/2020   Insulin dependent type 2 diabetes mellitus, uncontrolled ([place]) 06/01/2020   Diabetes mellitus with complication, with long-term current use of insulin ([place]) 06/01/2020   Chronic pain syndrome 05/24/2020   Pharmacologic therapy 05/24/2020   Disorder of skeletal system 05/24/2020   Problems influencing health status 05/24/2020   DDD (degenerative disc disease), cervical 05/24/2020   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, C5-6) (Left) 05/24/2020   Cervical Grade 1 Retrolisthesis of C4/C5 and C5/C6 05/24/2020   Chronic hand pain (Bilateral) 05/24/2020   Chronic leg and foot pain (Bilateral) 05/24/2020   Chronic lower extremity numbness from knee down (Bilateral) 05/24/2020   Chronic greater occipital neuralgia (Bilateral) 05/24/2020   Cervical facet syndrome (Left) 05/24/2020   Cervical disc disease with myelopathy 04/26/2020   Cervicalgia 04/09/2020   Syncope and collapse 04/09/2020  Esophageal dysphagia    Gastroesophageal reflux disease with esophagitis without hemorrhage    PAD (peripheral artery disease) ([place]) 09/27/2019   Diabetes ([place]) 09/14/2019   Swelling of limb 09/14/2019   Lymphedema 09/14/2019   Chronic obstructive pulmonary disease ([place]) 09/05/2019   Shortness of breath 09/05/2019   Chest pain 07/16/2018   Essential hypertension 04/28/2018   Open wound of right foot 01/28/2018   History of pancreatitis 11/16/2017   Statin-induced myositis 11/16/2017   Type 2 diabetes mellitus with diabetic polyneuropathy, with long-term current  use of insulin ([place]) 11/16/2017   Coronary atherosclerosis 11/15/2016   Heel ulcer due to DM ([place]) 12/09/2012   Septic olecranon bursitis 10/14/2011   Cellulitis 10/06/2011   Depression with anxiety 02/22/2011   Diabetic peripheral neuropathy ([place]) 02/22/2011   Migraines 02/22/2011   Other hyperlipidemia 02/22/2011   Sleep apnea 11/22/2010   Coronary artery disease with history of myocardial infarction without history of CABG 10/21/2009    Past Surgical History:  Procedure Laterality Date   bone graft surgery     CARDIAC CATHETERIZATION  2013   S/p PCI    CARDIAC CATHETERIZATION  2018   S/p CABG   CARDIAC SURGERY     cataract surgery Bilateral    CORONARY ARTERY BYPASS GRAFT  2018   (LIMA-LAD,VG-RCA,VG-OM1,VG-D1)   ESOPHAGOGASTRODUODENOSCOPY (EGD) WITH PROPOFOL N/A 04/04/2020   Procedure: ESOPHAGOGASTRODUODENOSCOPY (EGD) WITH PROPOFOL;  Surgeon: Lucilla Lame, MD;  Location: [facility] ENDOSCOPY;  Service: Endoscopy;  Laterality: N/A;   ESOPHAGOGASTRODUODENOSCOPY (EGD) WITH PROPOFOL N/A 06/06/2020   Procedure: ESOPHAGOGASTRODUODENOSCOPY (EGD) WITH PROPOFOL;  Surgeon: Lucilla Lame, MD;  Location: [facility] ENDOSCOPY;  Service: Endoscopy;  Laterality: N/A;   EYE SURGERY     PERIPHERAL VASCULAR CATHETERIZATION Right 10/28/2016   PTA/DEB Right SFA   WRIST SURGERY      Prior to Admission medications   Medication Sig Start Date End Date Taking? Authorizing Provider  acetaminophen-codeine (TYLENOL #3) 300-30 MG tablet TAKE ONE TAB PO TID FOR COUGH 02/05/21   [name], MD  Alirocumab (PRALUENT) 150 MG/ML SOAJ Inject 150 mg into the skin every 14 (fourteen) days. 10/24/20   Minna Merritts, MD  amLODipine (NORVASC) 5 MG tablet Take 1 tablet (5 mg total) by mouth daily. 12/07/20   McDonough, Si Gaul, PA-C  aspirin EC 81 MG tablet Take 81 mg by mouth daily. Swallow whole.    [provider]  augmented betamethasone dipropionate (DIPROLENE) 0.05 % ointment Apply topically 2 (two) times  daily. 11/30/20   [name], MD  carvedilol (COREG) 12.5 MG tablet Take 12.5 mg by mouth 2 (two) times daily with a meal.    [provider]  cholecalciferol (VITAMIN D3) 25 MCG (1000 UNIT) tablet Take 2,000 Units by mouth daily.    [provider]  ezetimibe (ZETIA) 10 MG tablet Take 1 tablet (10 mg total) by mouth daily. 01/16/21   Jonetta Osgood, NP  insulin degludec (TRESIBA FLEXTOUCH) 200 UNIT/ML FlexTouch Pen Inject 140 Units into the skin daily. 11/13/20   [name], MD  insulin lispro (HUMALOG) 100 UNIT/ML KwikPen Inject 20 Units into the skin daily. 0-20 units per sliding scale 11/13/20   [name], MD  Insulin Pen Needle (B-D ULTRAFINE III SHORT PEN) 31G X 8 MM MISC To use with pen basal and mealtime coverage insulin pens 5 times daily. DX: E11.65 04/17/20   Ronnell Freshwater, NP  isosorbide mononitrate (IMDUR) 30 MG 24 hr tablet Take 1 tablet (30 mg total)  by mouth daily. 10/24/20   Minna Merritts, MD  metoCLOPramide (REGLAN) 5 MG tablet One tab before each meal and one at bed time for dyspepsia prn 01/19/20   [name], MD  metolazone (ZAROXOLYN) 5 MG tablet Take 5 mg by mouth as needed.    [provider]  nortriptyline (PAMELOR) 10 MG capsule Take 30 mg by mouth at bedtime.    [provider]  pantoprazole (PROTONIX) 40 MG tablet Take 1 tablet (40 mg total) by mouth 2 (two) times daily before a meal. 12/08/20 03/08/21  McDonough, Si Gaul, PA-C  torsemide (DEMADEX) 20 MG tablet Take 1 tablet (20 mg total) by mouth 2 (two) times daily. Take extra 20 mg as needed for swelling/edema 10/24/20 01/22/21  Minna Merritts, MD    Allergies Contrast media [iodinated diagnostic agents], Other, Statins, Atorvastatin, Entresto [sacubitril-valsartan], Pravastatin, Pregabalin, and Rosuvastatin  Family History  Problem Relation Age of Onset   Diabetes Mother    Heart disease Father     Social History Social History   Tobacco Use   Smoking  status: Former    Packs/day: 2.00    Years: 39.00    Pack years: 78.00    Types: Cigarettes    Quit date: 05/18/2012    Years since quitting: 8.7   Smokeless tobacco: Never  Vaping Use   Vaping Use: Never used  Substance Use Topics   Alcohol use: Yes    Comment: very rarely - wine   Drug use: Never      Review of Systems Constitutional: No fever/chills Eyes: No visual changes. ENT: No sore throat. Cardiovascular: Positive chest pain Respiratory: Shortness of breath, cough Gastrointestinal: No abdominal pain.  Decreased appetite Genitourinary: Negative for dysuria. Musculoskeletal: Negative for back pain. Skin: Negative for rash. Neurological: Negative for headaches, focal weakness or numbness. All other ROS negative ____________________________________________   PHYSICAL EXAM:  VITAL SIGNS: ED Triage Vitals  Enc Vitals Group     BP 02/08/21 1446 (!) 148/87     Pulse Rate 02/08/21 1446 92     Resp 02/08/21 1446 (!) 23     Temp 02/08/21 1446 99.2 F (37.3 C)     Temp Source 02/08/21 1446 Oral     SpO2 02/08/21 1446 91 %     Weight 02/08/21 1449 208 lb (94.3 kg)     Height 02/08/21 1449 '6\' 2"'$  (1.88 m)     Head Circumference --      Peak Flow --      Pain Score 02/08/21 1447 3     Pain Loc --      Pain Edu? --      Excl. in [place]? --     Constitutional: Alert and oriented. Well appearing and in no acute distress. Eyes: Conjunctivae are normal. EOMI. Head: Atraumatic. Nose: No congestion/rhinnorhea. Mouth/Throat: Mucous membranes are moist.   Neck: No stridor. Trachea Midline. FROM Cardiovascular: Normal rate, regular rhythm. Grossly normal heart sounds.  Good peripheral circulation. Respiratory: Normal respiratory effort.  No retractions. Lungs CTAB.  On 2 L of oxygen.  Frequent coughing Gastrointestinal: Soft and nontender. No distention. No abdominal bruits.  Musculoskeletal: No lower extremity tenderness nor edema.  No joint effusions. Neurologic:  Normal  speech and language. No gross focal neurologic deficits are appreciated.  Skin:  Skin is warm, dry and intact. No rash noted. Psychiatric: Mood and affect are normal. Speech and behavior are normal. GU: Deferred   ____________________________________________   LABS (all labs ordered  are listed, but only abnormal results are displayed)  Labs Reviewed  BASIC METABOLIC PANEL - Abnormal; Notable for the following components:      Result Value   Sodium 127 (*)    Potassium 3.1 (*)    Chloride 84 (*)    Glucose, Bld 419 (*)    BUN 59 (*)    Creatinine, Ser 3.98 (*)    Calcium 8.8 (*)    GFR, Estimated 16 (*)    Anion gap 19 (*)    All other components within normal limits  CBC - Abnormal; Notable for the following components:   WBC 12.3 (*)    HCT 37.7 (*)    MCHC 36.3 (*)    All other components within normal limits  TROPONIN I (HIGH SENSITIVITY) - Abnormal; Notable for the following components:   Troponin I (High Sensitivity) 320 (*)    All other components within normal limits   ____________________________________________   ED ECG REPORT I, Vanessa [place], the attending physician, personally viewed and interpreted this ECG.  Normal sinus rate of 91, no ST elevation, no T wave inversions except for V5, little bit of QRS widening ____________________________________________  RADIOLOGY [name], personally viewed and evaluated these images (plain radiographs) as part of my medical decision making, as well as reviewing the written report by the radiologist.  ED MD interpretation: No pneumonia  Official radiology report(s): DG Chest 2 View  Result Date: 02/08/2021 CLINICAL DATA:  Chest pain EXAM: CHEST - 2 VIEW COMPARISON:  Chest radiograph 08/31/2020 FINDINGS: Median sternotomy wires are stable. The cardiomediastinal silhouette is stable. There is unchanged asymmetric elevation of the right hemidiaphragm. Linear opacities in the left base likely reflect atelectasis  and/or scar. There is no focal consolidation. There is no pulmonary edema. There is no pleural effusion or pneumothorax. There is no acute osseous abnormality. IMPRESSION: No radiographic evidence of acute cardiopulmonary process. Electronically Signed   By: Valetta Mole M.D.   On: 02/08/2021 16:00    ____________________________________________   PROCEDURES  Procedure(s) performed (including Critical Care):  .1-3 Lead EKG Interpretation Performed by: Vanessa [place], MD Authorized by: Vanessa [place], MD     Interpretation: normal     ECG rate:  90s   ECG rate assessment: normal     Rhythm: sinus rhythm     Ectopy: none     Conduction: normal   .Critical Care Performed by: Vanessa [place]anch[place], MD Authorized by: Vanessa [place], MD   Critical care provider statement:    Critical care time (minutes):  35   Critical care was necessary to treat or prevent imminent or life-threatening deterioration of the following conditions:  Respiratory failure   Critical care was time spent personally by me on the following activities:  Discussions with consultants, evaluation of patient's response to treatment, examination of patient, ordering and performing treatments and interventions, ordering and review of laboratory studies, ordering and review of radiographic studies, pulse oximetry, re-evaluation of patient's condition, obtaining history from patient or surrogate and review of old charts   ____________________________________________   INITIAL IMPRESSION / ASSESSMENT AND PLAN / ED COURSE   Juliann Keeley was evaluated in Emergency Department on 02/08/2021 for the symptoms described in the history of present illness. He was evaluated in the context of the global COVID-19 pandemic, which necessitated consideration that the patient might be at risk for infection with the SARS-CoV-2 virus that causes COVID-19. Institutional protocols and algorithms that pertain to the evaluation  of patients at risk for  COVID-19 are in a state of rapid change based on information released by regulatory bodies including the CDC and federal and state organizations. These policies and algorithms were followed during the patient's care in the ED.    Most Likely DDx:  -Patient is about day 9-10 of COVID with hypoxic respiratory failure now on 2 L of oxygen.  Lung sounds clear.  DDx that was also considered d/t potential to cause harm, but was found less likely based on history and physical (as detailed above): -PNA (no fevers, cough but CXR to evaluate) -PNX (reassured with equal b/l breath sounds, CXR to evaluate) -Symptomatic anemia (will get H&H) -Pulmonary embolism as no sob at rest, not pleuritic in nature, no hypoxia -Aortic Dissection as no tearing pain and no radiation to the mid back, pulses equal -Pericarditis no rub on exam, EKG changes or hx to suggest dx -Tamponade (no notable SOB, tachycardic, hypotensive) -Esophageal rupture (no h/o diffuse vomitting/no crepitus)   Kidney function is up to 3.98 so unable to do CTA.  We will prophylactically start heparin due to the elevated troponin with concern for potential PE given chest x-ray was negative.  Also elevated troponin could be NSTEMI.  EKG without evidence of STEMI.  We will give a dose of aspirin.  Given unable to do CTA will add on VQ scan, D-dimer, ultrasounds of legs for the hospital team.  Prior to starting heparin patient denies any falls, hitting head, issues with bleeding.  Will discuss to the hospital team for admission.  We will give 1 L of fluid due to his hyponatremia, dehydration from his acute on chronic CKD and his hyperglycemia.   ____________________________________________   FINAL CLINICAL IMPRESSION(S) / ED DIAGNOSES   Final diagnoses:  NSTEMI (non-ST elevated myocardial infarction) ([place])  AKI (acute kidney injury) ([place])  Hyperglycemia  COVID-19  Acute respiratory failure with hypoxia ([place])     MEDICATIONS GIVEN  DURING THIS VISIT:  Medications  sodium chloride 0.9 % bolus 1,000 mL (has no administration in time range)     ED Discharge Orders     None        Note:  This document was prepared using Dragon voice recognition software and may include unintentional dictation errors.    Vanessa [place], MD 02/08/21 [phone]

## 2021-02-09 ENCOUNTER — Encounter: Payer: Self-pay | Admitting: Internal Medicine

## 2021-02-09 ENCOUNTER — Inpatient Hospital Stay (HOSPITAL_COMMUNITY)
Admit: 2021-02-09 | Discharge: 2021-02-09 | Disposition: A | Discharge status: Home / Self Care | Payer: Managed Care, Other (non HMO) | Attending: Internal Medicine | Admitting: Internal Medicine

## 2021-02-09 ENCOUNTER — Inpatient Hospital Stay: Payer: Managed Care, Other (non HMO)

## 2021-02-09 DIAGNOSIS — I5022 Chronic systolic (congestive) heart failure: Secondary | ICD-10-CM | POA: Diagnosis not present

## 2021-02-09 DIAGNOSIS — R7989 Other specified abnormal findings of blood chemistry: Secondary | ICD-10-CM | POA: Diagnosis not present

## 2021-02-09 DIAGNOSIS — R0602 Shortness of breath: Secondary | ICD-10-CM

## 2021-02-09 DIAGNOSIS — N179 Acute kidney failure, unspecified: Secondary | ICD-10-CM | POA: Diagnosis not present

## 2021-02-09 DIAGNOSIS — E1165 Type 2 diabetes mellitus with hyperglycemia: Secondary | ICD-10-CM

## 2021-02-09 DIAGNOSIS — N189 Chronic kidney disease, unspecified: Secondary | ICD-10-CM

## 2021-02-09 DIAGNOSIS — R778 Other specified abnormalities of plasma proteins: Secondary | ICD-10-CM

## 2021-02-09 DIAGNOSIS — U071 COVID-19: Secondary | ICD-10-CM | POA: Diagnosis not present

## 2021-02-09 DIAGNOSIS — Z794 Long term (current) use of insulin: Secondary | ICD-10-CM

## 2021-02-09 DIAGNOSIS — I10 Essential (primary) hypertension: Secondary | ICD-10-CM

## 2021-02-09 DIAGNOSIS — J9601 Acute respiratory failure with hypoxia: Secondary | ICD-10-CM | POA: Diagnosis not present

## 2021-02-09 DIAGNOSIS — E871 Hypo-osmolality and hyponatremia: Secondary | ICD-10-CM

## 2021-02-09 LAB — CBC
HCT: 33.1 % — ABNORMAL LOW (ref 39.0–52.0)
Hemoglobin: 12.1 g/dL — ABNORMAL LOW (ref 13.0–17.0)
MCH: 31.5 pg (ref 26.0–34.0)
MCHC: 36.6 g/dL — ABNORMAL HIGH (ref 30.0–36.0)
MCV: 86.2 fL (ref 80.0–100.0)
Platelets: 325 10*3/uL (ref 150–400)
RBC: 3.84 MIL/uL — ABNORMAL LOW (ref 4.22–5.81)
RDW: 13.5 % (ref 11.5–15.5)
WBC: 9.5 10*3/uL (ref 4.0–10.5)
nRBC: 0 % (ref 0.0–0.2)

## 2021-02-09 LAB — ECHOCARDIOGRAM COMPLETE
AR max vel: 2.26 cm2
AV Area VTI: 2.72 cm2
AV Area mean vel: 1.82 cm2
AV Mean grad: 2 mmHg
AV Peak grad: 3.7 mmHg
Ao pk vel: 0.96 m/s
Area-P 1/2: 4.36 cm2
Height: 74 in
S' Lateral: 3.1 cm
Weight: 3336.88 oz

## 2021-02-09 LAB — PHOSPHORUS: Phosphorus: 4.5 mg/dL (ref 2.5–4.6)

## 2021-02-09 LAB — GLUCOSE, CAPILLARY
Glucose-Capillary: 518 mg/dL (ref 70–99)
Glucose-Capillary: 569 mg/dL (ref 70–99)
Glucose-Capillary: 463 mg/dL — ABNORMAL HIGH (ref 70–99)
Glucose-Capillary: 306 mg/dL — ABNORMAL HIGH (ref 70–99)
Glucose-Capillary: 244 mg/dL — ABNORMAL HIGH (ref 70–99)

## 2021-02-09 LAB — COMPREHENSIVE METABOLIC PANEL
ALT: 22 U/L (ref 0–44)
AST: 24 U/L (ref 15–41)
Albumin: 2.8 g/dL — ABNORMAL LOW (ref 3.5–5.0)
Alkaline Phosphatase: 59 U/L (ref 38–126)
Anion gap: 15 (ref 5–15)
BUN: 60 mg/dL — ABNORMAL HIGH (ref 8–23)
CO2: 24 mmol/L (ref 22–32)
Calcium: 8.5 mg/dL — ABNORMAL LOW (ref 8.9–10.3)
Chloride: 88 mmol/L — ABNORMAL LOW (ref 98–111)
Creatinine, Ser: 3.44 mg/dL — ABNORMAL HIGH (ref 0.61–1.24)
GFR, Estimated: 19 mL/min — ABNORMAL LOW (ref >60)
Glucose, Bld: 449 mg/dL — ABNORMAL HIGH (ref 70–99)
Potassium: 3 mmol/L — ABNORMAL LOW (ref 3.5–5.1)
Sodium: 127 mmol/L — ABNORMAL LOW (ref 135–145)
Total Bilirubin: 1.2 mg/dL (ref 0.3–1.2)
Total Protein: 7.4 g/dL (ref 6.5–8.1)

## 2021-02-09 LAB — HEPARIN LEVEL (UNFRACTIONATED)
Heparin Unfractionated: <0.1 IU/mL — ABNORMAL LOW (ref 0.30–0.70)
Heparin Unfractionated: 0.14 IU/mL — ABNORMAL LOW (ref 0.30–0.70)

## 2021-02-09 LAB — MAGNESIUM: Magnesium: 2.1 mg/dL (ref 1.7–2.4)

## 2021-02-09 LAB — PROCALCITONIN: Procalcitonin: 1.22 ng/mL

## 2021-02-09 LAB — BRAIN NATRIURETIC PEPTIDE: B Natriuretic Peptide: 470.8 pg/mL — ABNORMAL HIGH (ref 0.0–100.0)

## 2021-02-09 MED ORDER — INSULIN ASPART 100 UNIT/ML IJ SOLN
10.0000 [IU] | Freq: Three times a day (TID) | INTRAMUSCULAR | Status: DC
  Administered 2021-02-09: 10 [IU] via SUBCUTANEOUS

## 2021-02-09 MED ORDER — HEPARIN BOLUS VIA INFUSION
2800.0000 [IU] | Freq: Once | INTRAVENOUS | Status: AC
  Administered 2021-02-09: 2800 [IU] via INTRAVENOUS
  Filled 2021-02-09: qty 2800

## 2021-02-09 MED ORDER — TECHNETIUM TO 99M ALBUMIN AGGREGATED
4.0000 | Freq: Once | INTRAVENOUS | Status: AC
  Administered 2021-02-09: 4.37 via INTRAVENOUS

## 2021-02-09 MED ORDER — INSULIN LISPRO 100 UNIT/ML IJ SOLN
10.0000 [IU] | Freq: Three times a day (TID) | INTRAMUSCULAR | Status: DC
  Administered 2021-02-09 – 2021-02-10 (×4): 10 [IU] via SUBCUTANEOUS
  Filled 2021-02-09: qty 10

## 2021-02-09 MED ORDER — HEPARIN SODIUM (PORCINE) 5000 UNIT/ML IJ SOLN
5000.0000 [IU] | Freq: Three times a day (TID) | INTRAMUSCULAR | Status: DC
  Administered 2021-02-09 – 2021-02-11 (×5): 5000 [IU] via SUBCUTANEOUS
  Filled 2021-02-09 (×5): qty 1

## 2021-02-09 MED ORDER — POTASSIUM CHLORIDE CRYS ER 20 MEQ PO TBCR
20.0000 meq | EXTENDED_RELEASE_TABLET | Freq: Two times a day (BID) | ORAL | Status: DC
  Administered 2021-02-09 – 2021-02-11 (×5): 20 meq via ORAL
  Filled 2021-02-09: qty 1
  Filled 2021-02-09: qty 2
  Filled 2021-02-09 (×3): qty 1

## 2021-02-09 MED ORDER — INSULIN GLARGINE-YFGN 100 UNIT/ML ~~LOC~~ SOLN
110.0000 [IU] | Freq: Every day | SUBCUTANEOUS | Status: DC
  Filled 2021-02-09: qty 1.1

## 2021-02-09 MED ORDER — INSULIN DEGLUDEC 200 UNIT/ML ~~LOC~~ SOPN: 140.0000 mL | PEN_INJECTOR | Freq: Every day | SUBCUTANEOUS | Status: DC

## 2021-02-09 MED ORDER — INSULIN DEGLUDEC 200 UNIT/ML ~~LOC~~ SOPN
0.5500 mL | PEN_INJECTOR | Freq: Every day | SUBCUTANEOUS | Status: DC
  Administered 2021-02-09: 1.4 mL via SUBCUTANEOUS
  Filled 2021-02-09: qty 3

## 2021-02-09 MED ORDER — INSULIN LISPRO 100 UNIT/ML IJ SOLN
0.0000 [IU] | Freq: Three times a day (TID) | INTRAMUSCULAR | Status: DC
  Administered 2021-02-09: 20 [IU] via SUBCUTANEOUS
  Administered 2021-02-09: 15 [IU] via SUBCUTANEOUS
  Administered 2021-02-10 (×2): 20 [IU] via SUBCUTANEOUS
  Administered 2021-02-10: 11 [IU] via SUBCUTANEOUS
  Filled 2021-02-09: qty 10

## 2021-02-09 MED ORDER — INSULIN LISPRO 100 UNIT/ML IJ SOLN
0.0000 [IU] | Freq: Every day | INTRAMUSCULAR | Status: DC
  Administered 2021-02-09: 21 [IU] via SUBCUTANEOUS
  Filled 2021-02-09: qty 10

## 2021-02-09 MED ORDER — INSULIN GLARGINE-YFGN 100 UNIT/ML ~~LOC~~ SOLN
110.0000 [IU] | Freq: Every day | SUBCUTANEOUS | Status: DC
  Administered 2021-02-09: 110 [IU] via SUBCUTANEOUS
  Filled 2021-02-09: qty 1.1

## 2021-02-09 MED ORDER — INSULIN ASPART 100 UNIT/ML IJ SOLN
25.0000 [IU] | Freq: Once | INTRAMUSCULAR | Status: AC
  Administered 2021-02-09: 25 [IU] via SUBCUTANEOUS
  Filled 2021-02-09: qty 1

## 2021-02-09 MED ORDER — METHYLPREDNISOLONE SODIUM SUCC 40 MG IJ SOLR
40.0000 mg | Freq: Two times a day (BID) | INTRAMUSCULAR | Status: DC
  Administered 2021-02-09 – 2021-02-11 (×5): 40 mg via INTRAVENOUS
  Filled 2021-02-09 (×5): qty 1

## 2021-02-09 MED ORDER — SODIUM CHLORIDE 0.9 % IV SOLN
200.0000 mg | Freq: Once | INTRAVENOUS | Status: AC
  Administered 2021-02-09: 200 mg via INTRAVENOUS
  Filled 2021-02-09: qty 200

## 2021-02-09 MED ORDER — SODIUM CHLORIDE 0.9 % IV SOLN
100.0000 mg | Freq: Every day | INTRAVENOUS | Status: DC
  Administered 2021-02-10 – 2021-02-11 (×2): 100 mg via INTRAVENOUS
  Filled 2021-02-09 (×2): qty 100

## 2021-02-09 NOTE — Plan of Care (Signed)

## 2021-02-09 NOTE — Progress Notes (Signed)
Patient has severe neuropathy from waist down and has to use a wheelchair as a result.  He is able to stand.

## 2021-02-09 NOTE — Consult Note (Addendum)
Cardiology Consult    Patient ID: [name] [name] MRN: [id number], DOB/AGE: 62-Apr-1962   Admit date: 02/08/2021 Date of Consult: 02/09/2021  Primary Physician: [name], MD Primary Cardiologist: Ida Rogue, MD Requesting Provider: R. Wieting, MD Reason for consult: Elevated troponin  Patient Profile    [name] [name] is a 60 y.o. male with a history of CAD s/p CABG x 4 in 2018, ICM/HFrEF (EF 30-35% 04/2020), HTN, HL (statin intolerant), PAD, DMII, GERD, OSA, and CKD IV, who is being seen today for the evaluation of demand ischemia in the setting of COVID-19 infection at the request of Dr. Leslye Peer.  Past Medical History   Past Medical History:  Diagnosis Date   Arthritis    CAD (coronary artery disease)    a. 12/2016 s/p CABG x 4 (LIMA->LAD, VG->RCA, VG->OM1, VG->D1); b. 02/2018 MV: EF 35%, small-med inferolaterlal infarct. No ischemia.   CKD (chronic kidney disease), stage IV (HCC)    Colon polyps    Diabetes ([place])    Dupre's syndrome    GERD (gastroesophageal reflux disease)    HFrEF (heart failure reduced ejection fraction) ([place])    a. 2018 EF 35%; b. 02/2018 EF 50%; c. 02/2019 EF 40-45%; d. 08/2019 Echo: EF 50-55%, no rwma, GrII DD; e. 04/2020 Echo: EF 30-35%, mod-sev glob HK. Mild LVH. GrII DD. Low-nl RV fxn. Mildly dil LA. Mild MR. Mild-mod Ao sclerosis w/o stenosis.   Hyperlipidemia    Hypertension    Ischemic cardiomyopathy    a. 02/2018 MV: EF 35%; b. 08/2019 Echo: EF 50-55%; c. 04/2020 Echo: EF 30-35%.   Lymphedema    Migraine    Neuropathy    PAD (peripheral artery disease) ([place])    a. 04/2017 Aortoiliac duplex: R iliac dzs; b. 03/2020 LE Duplex: mild bilat atherosclerosis throughout. Patent vessels.   Retinopathy    Sleep apnea    Stomach ulcer     Past Surgical History:  Procedure Laterality Date   bone graft surgery     CARDIAC CATHETERIZATION  2013   S/p PCI    CARDIAC CATHETERIZATION  2018   S/p CABG   CARDIAC SURGERY     cataract surgery  Bilateral    CORONARY ARTERY BYPASS GRAFT  2018   (LIMA-LAD,VG-RCA,VG-OM1,VG-D1)   ESOPHAGOGASTRODUODENOSCOPY (EGD) WITH PROPOFOL N/A 04/04/2020   Procedure: ESOPHAGOGASTRODUODENOSCOPY (EGD) WITH PROPOFOL;  Surgeon: Lucilla Lame, MD;  Location: [facility] END[facility];  Service: Endoscopy;  Laterality: N/A;   ESOPHAGOGASTRODUODENOSCOPY (EGD) WITH PROPOFOL N/A 06/06/2020   Procedure: ESOPHAGOGASTRODUODENOSCOPY (EGD) WITH PROPOFOL;  Surgeon: Lucilla Lame, MD;  Location: [facility] ENDOSCOPY;  Service: Endoscopy;  Laterality: N/A;   EYE SURGERY     PERIPHERAL VASCULAR CATHETERIZATION Right 10/28/2016   PTA/DEB Right SFA   WRIST SURGERY      Allergies  Allergies  Allergen Reactions   Contrast Media [Iodinated Diagnostic Agents]     Kidney disease    Other Other (See Comments)    Pain  With joint stiffness     Statins     Pain  With joint stiffness    Atorvastatin Other (See Comments)    Muscle aches Muscle aches   Entresto [Sacubitril-Valsartan]     Hyperkalemia    Pravastatin Other (See Comments)    Muscle aches Muscle aches   Pregabalin Other (See Comments)    Generalized aches and pains Generalized aches and pains   Rosuvastatin Other (See Comments)    Muscle Aches Other reaction(s): JOINT PAIN Other reaction(s): JOINT PAIN Muscle Aches  History of Present Illness    60 y/o ? w/ the above complex PMH including CAD s/p 4 vessel CABG in 2018, HFrEF, ICM, HTN, HL w/ statin intolerance, PAD, DMII, GERD, OSA, and CKD IV.  As noted, he underwent CABG x 4 in 2018.  Most recent stress test was in 02/2018 in [place], and showed a small to medium inferolateral infarct w/o ischemia.  EF has wavered over the years w/ an EF of 35% in 2018 that improved to 50-55% by echo in 08/2019.  In the setting of a suffocating feeling when using CPAP in 04/2020, his pulmonologist repeated an echo w/ finding of recurrent LV dysfxn, and an EF of 30-35%.  In this setting, he has had chronic lower ext edema w/  greater diuretic needs including torsemide '20mg'$  BID.  At clinic f/u in 09/2020, a lexiscan myoview was ordered, given recurrent drop in EF, however the pt did not follow through w/ the study.  GDMT has been limited by a variety of intolerances (farxiga stopped 2/2 pancreatitis; entresto stopped 2/2 hyperK) and progressive CKD.  He reports 3 days ago developing cough, shortness of breath, weakness Reports sugars running high, 500 or more Reports they are typically labile 100 up to 600 Details that several family members with COVID, likely got infection from them in the past week In the emergency room saturations 88% on room air up to 93% on 2 L Troponin checks running around 300 EKG nonacute  Inpatient Medications     amLODipine  5 mg Oral Daily   vitamin C  500 mg Oral Daily   aspirin EC  81 mg Oral Daily   carvedilol  6.25 mg Oral BID WC   cholecalciferol  2,000 Units Oral Daily   ezetimibe  10 mg Oral Daily   folic acid  1 mg Oral Daily   guaiFENesin  1,200 mg Oral BID   insulin aspart  0-20 Units Subcutaneous TID WC   insulin aspart  0-5 Units Subcutaneous QHS   insulin aspart  10 Units Subcutaneous TID WC   insulin glargine-yfgn  110 Units Subcutaneous Daily   Ipratropium-Albuterol  1 puff Inhalation Q6H   isosorbide mononitrate  30 mg Oral Daily   methylPREDNISolone (SOLU-MEDROL) injection  40 mg Intravenous Q12H   multivitamin with minerals  1 tablet Oral Daily   nortriptyline  30 mg Oral QHS   pantoprazole  40 mg Oral BID AC   potassium chloride  20 mEq Oral BID   thiamine  100 mg Oral Daily   zinc sulfate  220 mg Oral Daily    Family History    Family History  Problem Relation Age of Onset   Diabetes Mother    Heart disease Father    He indicated that his mother is deceased. He indicated that his father is deceased.  Social History    Social History   Socioeconomic History   Marital status: Married    Spouse name: Not on file   Number of children: Not on file    Years of education: Not on file   Highest education level: Not on file  Occupational History   Not on file  Tobacco Use   Smoking status: Former    Packs/day: 2.00    Years: 39.00    Pack years: 78.00    Types: Cigarettes    Quit date: 05/18/2012    Years since quitting: 8.7   Smokeless tobacco: Never  Vaping Use   Vaping Use: Never used  Substance and Sexual Activity   Alcohol use: Yes    Comment: very rarely - wine   Drug use: Never   Sexual activity: Not on file  Other Topics Concern   Not on file  Social History Narrative   Not on file   Social Determinants of Health   Financial Resource Strain: Not on file  Food Insecurity: Not on file  Transportation Needs: Not on file  Physical Activity: Not on file  Stress: Not on file  Social Connections: Not on file  Intimate Partner Violence: Not on file     Review of Systems     Review of Systems  Constitutional: Negative.   HENT: Negative.    Respiratory:  Positive for cough and shortness of breath.   Cardiovascular: Negative.   Gastrointestinal: Negative.   Musculoskeletal: Negative.   Neurological: Negative.   Psychiatric/Behavioral: Negative.    All other systems reviewed and are negative.   Physical Exam    Blood pressure (!) 164/76, pulse 84, temperature 97.9 F (36.6 C), resp. rate 18, height '6\' 2"'$  (1.88 m), weight 94.6 kg, SpO2 94 %.  General: Pleasant, NAD Psych: Normal affect. Neuro: Alert and oriented X 3. Moves all extremities spontaneously. HEENT: Normal  Neck: Supple without bruits or JVD. Lungs:  Resp regular and unlabored, CTA. Heart: RRR no s3, s4, or murmurs. Abdomen: Soft, non-tender, non-distended, BS + x 4.  Extremities: No clubbing, cyanosis or edema. DP/PT2+, Radials 2+ and equal bilaterally.  Labs    Cardiac Enzymes Recent Labs  Lab 02/08/21 1455 02/08/21 1633 02/08/21 1724  TROPONINIHS 320* 334* 259*      Lab Results  Component Value Date   WBC 9.5 02/09/2021   HGB  12.1 (L) 02/09/2021   HCT 33.1 (L) 02/09/2021   MCV 86.2 02/09/2021   PLT 325 02/09/2021    Recent Labs  Lab 02/09/21 0129  NA 127*  K 3.0*  CL 88*  CO2 24  BUN 60*  CREATININE 3.44*  CALCIUM 8.5*  PROT 7.4  BILITOT 1.2  ALKPHOS 59  ALT 22  AST 24  GLUCOSE 449*   Lab Results  Component Value Date   CHOL 201 (H) 12/07/2020   HDL 37 (L) 12/07/2020   LDLCALC 100 (H) 12/07/2020   TRIG 381 (H) 12/07/2020   Lab Results  Component Value Date   DDIMER 2.35 (H) 02/08/2021     Radiology Studies    DG Chest 2 View  Result Date: 02/08/2021 CLINICAL DATA:  Chest pain EXAM: CHEST - 2 VIEW COMPARISON:  Chest radiograph 08/31/2020 FINDINGS: Median sternotomy wires are stable. The cardiomediastinal silhouette is stable. There is unchanged asymmetric elevation of the right hemidiaphragm. Linear opacities in the left base likely reflect atelectasis and/or scar. There is no focal consolidation. There is no pulmonary edema. There is no pleural effusion or pneumothorax. There is no acute osseous abnormality. IMPRESSION: No radiographic evidence of acute cardiopulmonary process. Electronically Signed   By: Valetta Mole M.D.   On: 02/08/2021 16:00   US Venous Img Lower Bilateral  Result Date: 02/08/2021 CLINICAL DATA:  Bilateral lower extremity swelling EXAM: BILATERAL LOWER EXTREMITY VENOUS DOPPLER ULTRASOUND TECHNIQUE: Gray-scale sonography with graded compression, as well as color Doppler and duplex ultrasound were performed to evaluate the lower extremity deep venous systems from the level of the common femoral vein and including the common femoral, femoral, profunda femoral, popliteal and calf veins including the posterior tibial, peroneal and gastrocnemius veins when visible. The superficial great saphenous vein  was also interrogated. Spectral Doppler was utilized to evaluate flow at rest and with distal augmentation maneuvers in the common femoral, femoral and popliteal veins. COMPARISON:   None. FINDINGS: RIGHT LOWER EXTREMITY Common Femoral Vein: No evidence of thrombus. Normal compressibility, respiratory phasicity and response to augmentation. Saphenofemoral Junction: No evidence of thrombus. Normal compressibility and flow on color Doppler imaging. Profunda Femoral Vein: No evidence of thrombus. Normal compressibility and flow on color Doppler imaging. Femoral Vein: No evidence of thrombus. Normal compressibility, respiratory phasicity and response to augmentation. Popliteal Vein: No evidence of thrombus. Normal compressibility, respiratory phasicity and response to augmentation. Calf Veins: No evidence of thrombus. Normal compressibility and flow on color Doppler imaging. Superficial Great Saphenous Vein: No evidence of thrombus. Normal compressibility. Venous Reflux:  None. Other Findings:  None. LEFT LOWER EXTREMITY Common Femoral Vein: No evidence of thrombus. Normal compressibility, respiratory phasicity and response to augmentation. Saphenofemoral Junction: No evidence of thrombus. Normal compressibility and flow on color Doppler imaging. Profunda Femoral Vein: No evidence of thrombus. Normal compressibility and flow on color Doppler imaging. Femoral Vein: No evidence of thrombus. Normal compressibility, respiratory phasicity and response to augmentation. Popliteal Vein: No evidence of thrombus. Normal compressibility, respiratory phasicity and response to augmentation. Calf Veins: No evidence of thrombus. Normal compressibility and flow on color Doppler imaging. Superficial Great Saphenous Vein: No evidence of thrombus. Normal compressibility. Venous Reflux:  None. Other Findings:  None. IMPRESSION: No evidence of deep venous thrombosis in either lower extremity. Electronically Signed   By: Jacqulynn Cadet M.D.   On: 02/08/2021 17:36    ECG & Cardiac Imaging    RSR, 91, inferior infarct, LVH - personally reviewed.  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), HTN,  HLD,  Stage III chronic kidney disease  GERD Former smoker quit 2013 Sleep apnea, unable to tolerate mask Statin intolerance Chronic renal insuff Who presents with hypoxia, cough, weakness, shortness of breath, COVID-positive, elevated troponin  1:  Elevated troponin/demand ischemia Known coronary artery disease, history of bypass in the setting of COVID, respiratory distress/hypoxia, hypertension (pressures up to 99991111 systolic), poorly controlled diabetes -Nonacute EKG, enzymes trending down, he is asymptomatic -No plan for ischemic work-up at this time -We will continue outpatient medications including aspirin, carvedilol, Zetia, Imdur, PCSK9 inhibitor  Ischemic cardiomyopathy Last ejection fraction 30 to 35%, has been labile over the past several years Repeat echocardiogram pending Typically takes metolazone twice a week Given worsening renal function, no significant edema, recommended he take metolazone more sparingly, no more than once a week for ankle swelling  Acute on chronic renal failure Secondary to long history of poorly controlled diabetes Reports taking torsemide 20 twice daily Recommend he cut back on his metolazone from twice a week down to less than once a week if at all -No edema on exam, appears prerenal/euvolemic  Medication noncompliance/intolerance Reports history of intolerance to SGLT2 inhibitors, hydralazine, statins Not a good candidate for ACE, ARB, Entresto secondary to renal failure  Hyperlipidemia On Repatha, Zetia  Poorly controlled diabetes with complications Long history of poor control, medication intolerance Has neuropathy contributing to leg weakness  Addendum: Echocardiogram with ejection fraction 50 to 55%, septal wall hypokinesis consistent with postoperative state No other acute findings noted   Total encounter time more than  110 minutes  Greater than 50% was spent in counseling and coordination of care  with the patient  [facility] will sign off.   Medication Recommendations: No further changes Other recommendations (labs, testing, etc): No further testing Follow up as an outpatient: Patient follow-up with [facility]   Signed, Esmond Plants, MD, Ph.D [facility]   For questions or updates, please contact   Please consult [email or web address] for contact info under Cardiology/STEMI.

## 2021-02-09 NOTE — Consult Note (Signed)
ANTICOAGULATION CONSULT NOTE - Consult  Pharmacy Consult for Heparin gtt Indication: pulmonary embolus  Allergies  Allergen Reactions   Contrast Media [Iodinated Diagnostic Agents]     Kidney disease    Other Other (See Comments)    Pain  With joint stiffness     Statins     Pain  With joint stiffness    Atorvastatin Other (See Comments)    Muscle aches Muscle aches   Entresto [Sacubitril-Valsartan]     Hyperkalemia    Pravastatin Other (See Comments)    Muscle aches Muscle aches   Pregabalin Other (See Comments)    Generalized aches and pains Generalized aches and pains   Rosuvastatin Other (See Comments)    Muscle Aches Other reaction(s): JOINT PAIN Other reaction(s): JOINT PAIN Muscle Aches     Patient Measurements: Height: '6\' 2"'$  (188 cm) Weight: 94.3 kg (208 lb) IBW/kg (Calculated) : 82.2 Heparin Dosing Weight: 94.3kg  Vital Signs: Temp: 98.2 F (36.8 C) (09/16 0011) Temp Source: Oral (09/16 0011) BP: 177/75 (09/16 0011) Pulse Rate: 79 (09/16 0011)  Labs: Recent Labs    02/08/21 1455 02/08/21 1633 02/08/21 1724 02/09/21 0129  HGB 13.7  --   --  12.1*  HCT 37.7*  --   --  33.1*  PLT 339  --   --  325  APTT  --   --  37*  --   LABPROT  --   --  16.9*  --   INR  --   --  1.4*  --   HEPARINUNFRC  --   --   --  <0.10*  CREATININE 3.98*  --   --  3.44*  TROPONINIHS 320* 334* 259*  --      Estimated Creatinine Clearance: 26.2 mL/min (A) (by C-G formula based on SCr of 3.44 mg/dL (H)).   Medications: No AC/APT pertinent med allergies PTA: Not on AC. 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 Pharmacy consulted for the mgmt of heparin gtt d/t c/f PE.  Date Time aPTT/HL Rate/Comment 0916 0129 HL <  0.10 Subtherapeutic 1500>1800 units/hr      Baseline Labs: aPTT - 37 INR - 1.4 Hgb - 13.7 Plts - 37.7  Goal of Therapy:  Heparin level 0.3-0.7 units/ml Monitor platelets by anticoagulation protocol: Yes   Plan:  C/f PE, heparin prophylactic. Troponins elevated, initial CXR negative, will f/u VQ scan, d-dimer, and LE [place] to r/o DVT. Heparin level subtherapeutic < 0.10 (goal 0.3-0.7)94 Give 2800 units bolus x1  Increase heparin infusion to 1800 units/hr Check anti-Xa level  8 hour following rate change and daily while on heparin Continue to monitor H&H and platelets  Dorothe Pea, PharmD, BCPS Clinical Pharmacist   02/09/2021,2:46 AM

## 2021-02-09 NOTE — Progress Notes (Signed)
Patient ID: [name] [name], male   DOB: 17-Oct-1960, 60 y.o.   MRN: [id number] [facility] PROGRESS NOTE  Jonis Deane [id number] DOB: 06-Mar-1961 DOA: 02/08/2021 PCP: [name], MD  HPI/Subjective: Patient this morning was upset that they did not give him insulin last night and his sugars are very high.  Also received high doses of steroids secondary to COVID-19 infection.  Complains of shortness of breath but a little bit better than yesterday.  No complaints of chest pain.  Objective: Vitals:   02/09/21 0726 02/09/21 1156  BP: (!) 164/76 (!) 152/72  Pulse: 84 86  Resp: 18 17  Temp: 97.9 F (36.6 C) 98.2 F (36.8 C)  SpO2: 94% 98%    Intake/Output Summary (Last 24 hours) at 02/09/2021 1310 Last data filed at 02/09/2021 0500 Gross per 24 hour  Intake 1125.52 ml  Output 1200 ml  Net -74.48 ml   Filed Weights   02/08/21 1449 02/09/21 0500  Weight: 94.3 kg 94.6 kg    ROS: Review of Systems  Respiratory:  Positive for cough and shortness of breath.   Cardiovascular:  Negative for chest pain.  Gastrointestinal:  Negative for abdominal pain, nausea and vomiting.  Exam: Physical Exam HENT:     Head: Normocephalic.     Mouth/Throat:     Pharynx: No oropharyngeal exudate.  Eyes:     General: Lids are normal.     Conjunctiva/sclera: Conjunctivae normal.  Cardiovascular:     Rate and Rhythm: Normal rate and regular rhythm.     Heart sounds: Normal heart sounds, S1 normal and S2 normal.  Pulmonary:     Breath sounds: Examination of the right-middle field reveals wheezing. Examination of the left-middle field reveals wheezing. Examination of the right-lower field reveals decreased breath sounds and rhonchi. Examination of the left-lower field reveals decreased breath sounds and rhonchi. Decreased breath sounds, wheezing and rhonchi present. No rales.  Abdominal:     Palpations: Abdomen is soft.     Tenderness: There is no abdominal tenderness.  Skin:     General: Skin is warm.     Findings: No rash.  Neurological:     Mental Status: He is alert and oriented to person, place, and time.      Scheduled Meds:  amLODipine  5 mg Oral Daily   vitamin C  500 mg Oral Daily   aspirin EC  81 mg Oral Daily   carvedilol  6.25 mg Oral BID WC   cholecalciferol  2,000 Units Oral Daily   ezetimibe  10 mg Oral Daily   folic acid  1 mg Oral Daily   guaiFENesin  1,200 mg Oral BID   insulin degludec  0.55 mL Subcutaneous Q2200   insulin lispro  0-20 Units Subcutaneous TID WC   insulin lispro  0-5 Units Subcutaneous QHS   insulin lispro  10 Units Subcutaneous TID WC   Ipratropium-Albuterol  1 puff Inhalation Q6H   isosorbide mononitrate  30 mg Oral Daily   methylPREDNISolone (SOLU-MEDROL) injection  40 mg Intravenous Q12H   multivitamin with minerals  1 tablet Oral Daily   nortriptyline  30 mg Oral QHS   pantoprazole  40 mg Oral BID AC   potassium chloride  20 mEq Oral BID   technetium albumin aggregated  4 millicurie Intravenous Once   thiamine  100 mg Oral Daily   zinc sulfate  220 mg Oral Daily   Continuous Infusions:  heparin 2,100 Units/hr (02/09/21 1245)   [START ON 02/10/2021] remdesivir  100 mg in NS 100 mL      Assessment/Plan:  Acute hypoxic respiratory failure.  ER physician documented a pulse ox of 88% on room air.  Currently on 2 L of oxygen. COVID-19 infection.  Continue steroids but lower the dose secondary to elevated sugars and start remdesivir. Elevated D-dimer VQ scan ordered but still pending.  Currently on heparin drip.  Hopefully can discontinue heparin if VQ scan is negative Elevated troponin likely demand ischemia secondary to acute hypoxic respiratory failure.  Cardiology consultation echocardiogram ordered Type 2 diabetes mellitus uncontrolled.  Patient did not receive insulin last night so sugars very high this morning.  Patient would like to go back on his Tresiba insulin and short acting insulin that he uses at home.   Decrease steroid dose down to 40 mg twice a day Acute kidney injury on chronic kidney disease stage IV.  Creatinine 3.98 on presentation down to 3.44.  With history of cardiomyopathy we will hold IV fluids now. Hyponatremia.  Sodium [id number] Chronic systolic congestive heart failure with cardiomyopathy.  Hold IV fluids at this point.  Continue Coreg. Essential hypertension on Coreg and Norvasc        Code Status:     Code Status Orders  (From admission, onward)           Start     Ordered   02/08/21 1638  Full code  Continuous        02/08/21 1637           Code Status History     Date Active Date Inactive Code Status Order ID Comments User Context   08/31/2020 2007 09/01/2020 2102 Full Code [id number]  Marcelyn Bruins, MD ED      Advance Directive Documentation    [place] Most Recent Value  Type of Advance Directive Living will  Pre-existing out of facility DNR order (yellow form or pink MOST form) --  "MOST" Form in Place? --      Family Communication: Updated wife on the phone Disposition Plan: Status is: Inpatient  Dispo:  Patient From: Home  Planned Disposition: Home  Medically stable for discharge: No   Consultants: Cardiology  Time spent: 29 minutes  [place]

## 2021-02-09 NOTE — Consult Note (Signed)
ANTICOAGULATION CONSULT NOTE - Consult  Pharmacy Consult for Heparin gtt Indication: pulmonary embolus  Allergies  Allergen Reactions   Contrast Media [Iodinated Diagnostic Agents]     Kidney disease    Other Other (See Comments)    Pain  With joint stiffness     Statins     Pain  With joint stiffness    Atorvastatin Other (See Comments)    Muscle aches Muscle aches   Entresto [Sacubitril-Valsartan]     Hyperkalemia    Pravastatin Other (See Comments)    Muscle aches Muscle aches   Pregabalin Other (See Comments)    Generalized aches and pains Generalized aches and pains   Rosuvastatin Other (See Comments)    Muscle Aches Other reaction(s): JOINT PAIN Other reaction(s): JOINT PAIN Muscle Aches     Patient Measurements: Height: '6\' 2"'$  (188 cm) Weight: 94.6 kg (208 lb 8.9 oz) IBW/kg (Calculated) : 82.2 Heparin Dosing Weight: 94.3kg  Vital Signs: Temp: 98.2 F (36.8 C) (09/16 1156) Temp Source: Oral (09/16 0527) BP: 152/72 (09/16 1156) Pulse Rate: 86 (09/16 1156)  Labs: Recent Labs    02/08/21 1455 02/08/21 1633 02/08/21 1724 02/09/21 0129 02/09/21 1120  HGB 13.7  --   --  12.1*  --   HCT 37.7*  --   --  33.1*  --   PLT 339  --   --  325  --   APTT  --   --  37*  --   --   LABPROT  --   --  16.9*  --   --   INR  --   --  1.4*  --   --   HEPARINUNFRC  --   --   --  <0.10* 0.14*  CREATININE 3.98*  --   --  3.44*  --   TROPONINIHS 320* 334* 259*  --   --      Estimated Creatinine Clearance: 26.2 mL/min (A) (by C-G formula based on SCr of 3.44 mg/dL (H)).   Medications: No AC/APT pertinent med allergies PTA: Not on AC. 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 Pharmacy consulted for the mgmt of heparin gtt  d/t c/f PE.  Date Time aPTT/HL Rate/Comment 0916 0129 HL < 0.10 Subtherapeutic 1500>1800 units/hr      Baseline Labs: aPTT - 37 INR - 1.4 Hgb - 13.7 Plts - 37.7   9/16'@0129'$ : HL<0.10  Goal of Therapy:  Heparin level 0.3-0.7 units/ml Monitor platelets by anticoagulation protocol: Yes   Plan:  C/f PE, heparin prophylactic. Troponins elevated, initial CXR negative, will f/u VQ scan, d-dimer, and LE [place] to r/o DVT. 9/16'@1120'$ : HL 0.14, subtherapeutic  Give 2800 units bolus x1  Increase heparin infusion to 2100 units/hr Check anti-Xa level  8 hour following rate change and daily while on heparin Continue to monitor H&H and platelets  Pearla Dubonnet, PharmD Clinical Pharmacist 02/09/2021 2:18 PM

## 2021-02-09 NOTE — Progress Notes (Signed)
*  PRELIMINARY RESULTS* Echocardiogram 2D Echocardiogram has been performed.  [name] [name] 02/09/2021, 10:01 AM

## 2021-02-09 NOTE — Consult Note (Signed)
Remdesivir - Pharmacy Brief Note   O:  ALT: 9/16: 22 CXR: 9/15: No radiographic evidence of acute cardiopulmonary process SpO2: 94% on 2L [place]   A/P:  Remdesivir 200 mg IVPB once followed by 100 mg IVPB daily x 4 days.    Narda Rutherford, PharmD Pharmacy Resident  02/09/2021 8:23 AM

## 2021-02-09 NOTE — Progress Notes (Signed)
Inpatient Diabetes Program Recommendations  AACE/ADA: New Consensus Statement on Inpatient Glycemic Control (2015)  Target Ranges:  Prepandial:   less than 140 mg/dL      Peak postprandial:   less than 180 mg/dL (1-2 hours)      Critically ill patients:  140 - 180 mg/dL   Lab Results  Component Value Date   GLUCAP 463 (H) 02/09/2021   HGBA1C 13.2 (A) 12/07/2020    Review of Glycemic Control  Diabetes history: DM 2 Outpatient Diabetes medications: Tresiba 140 units Daily, Humalog ssi Current orders for Inpatient glycemic control:  Semglee 110 units Daily Novolog 0-20 units + hs Novolog 10 units tid meal coverage  A1c 13.2% on 7/14  -  Consider Semglee 70 units bid  Spoke with pt at bedside regarding A1c and glucose control at home. Pt has had some appointments he was not able to make with Endocrinology but finally has one on the 29 th of this month. Pt reports wanting to be on insulin pump therapy in the near future to help with glycemic control.  Pt very knowledgeable of lifestyle changes and reports eating healthy at home. Pt is limited with physical activity due to neuropathy and limited hand usage for gripping.   Pt is not able to be on a lot of newer DM medications due to contraindications.  Recommended for pt to follow up with Endocrinologist for initiation of pump therapy to help with glucose control.   Thanks,  Tama Headings RN, MSN, BC-ADM Inpatient Diabetes Coordinator Team Pager [phone] (8a-5p)

## 2021-02-10 DIAGNOSIS — J9601 Acute respiratory failure with hypoxia: Secondary | ICD-10-CM | POA: Diagnosis not present

## 2021-02-10 DIAGNOSIS — R7989 Other specified abnormal findings of blood chemistry: Secondary | ICD-10-CM | POA: Diagnosis not present

## 2021-02-10 DIAGNOSIS — U071 COVID-19: Secondary | ICD-10-CM | POA: Diagnosis not present

## 2021-02-10 DIAGNOSIS — R062 Wheezing: Secondary | ICD-10-CM

## 2021-02-10 LAB — COMPREHENSIVE METABOLIC PANEL
ALT: 25 U/L (ref 0–44)
AST: 40 U/L (ref 15–41)
Albumin: 3 g/dL — ABNORMAL LOW (ref 3.5–5.0)
Alkaline Phosphatase: 68 U/L (ref 38–126)
Anion gap: 13 (ref 5–15)
BUN: 71 mg/dL — ABNORMAL HIGH (ref 8–23)
CO2: 26 mmol/L (ref 22–32)
Calcium: 8.8 mg/dL — ABNORMAL LOW (ref 8.9–10.3)
Chloride: 94 mmol/L — ABNORMAL LOW (ref 98–111)
Creatinine, Ser: 3.04 mg/dL — ABNORMAL HIGH (ref 0.61–1.24)
GFR, Estimated: 23 mL/min — ABNORMAL LOW (ref >60)
Glucose, Bld: 275 mg/dL — ABNORMAL HIGH (ref 70–99)
Potassium: 3.3 mmol/L — ABNORMAL LOW (ref 3.5–5.1)
Sodium: 133 mmol/L — ABNORMAL LOW (ref 135–145)
Total Bilirubin: 0.7 mg/dL (ref 0.3–1.2)
Total Protein: 7.3 g/dL (ref 6.5–8.1)

## 2021-02-10 LAB — GLUCOSE, CAPILLARY
Glucose-Capillary: 268 mg/dL — ABNORMAL HIGH (ref 70–99)
Glucose-Capillary: 273 mg/dL — ABNORMAL HIGH (ref 70–99)
Glucose-Capillary: 362 mg/dL — ABNORMAL HIGH (ref 70–99)
Glucose-Capillary: 357 mg/dL — ABNORMAL HIGH (ref 70–99)
Glucose-Capillary: 322 mg/dL — ABNORMAL HIGH (ref 70–99)

## 2021-02-10 LAB — CBC
HCT: 35.4 % — ABNORMAL LOW (ref 39.0–52.0)
Hemoglobin: 12.9 g/dL — ABNORMAL LOW (ref 13.0–17.0)
MCH: 31.5 pg (ref 26.0–34.0)
MCHC: 36.4 g/dL — ABNORMAL HIGH (ref 30.0–36.0)
MCV: 86.3 fL (ref 80.0–100.0)
Platelets: 377 10*3/uL (ref 150–400)
RBC: 4.1 MIL/uL — ABNORMAL LOW (ref 4.22–5.81)
RDW: 13.6 % (ref 11.5–15.5)
WBC: 9.9 10*3/uL (ref 4.0–10.5)
nRBC: 0 % (ref 0.0–0.2)

## 2021-02-10 MED ORDER — LOPERAMIDE HCL 2 MG PO CAPS
4.0000 mg | ORAL_CAPSULE | Freq: Three times a day (TID) | ORAL | Status: DC | PRN
  Administered 2021-02-10: 4 mg via ORAL
  Filled 2021-02-10: qty 2

## 2021-02-10 MED ORDER — INSULIN DEGLUDEC 200 UNIT/ML ~~LOC~~ SOPN: 0.5500 mL | PEN_INJECTOR | Freq: Every day | SUBCUTANEOUS | Status: DC

## 2021-02-10 MED ORDER — INSULIN ASPART 100 UNIT/ML IJ SOLN
0.0000 [IU] | Freq: Every day | INTRAMUSCULAR | Status: DC
  Filled 2021-02-10: qty 1

## 2021-02-10 MED ORDER — INSULIN GLARGINE-YFGN 100 UNIT/ML ~~LOC~~ SOLN
110.0000 [IU] | Freq: Every day | SUBCUTANEOUS | Status: DC
  Administered 2021-02-10: 110 [IU] via SUBCUTANEOUS
  Filled 2021-02-10 (×2): qty 1.1

## 2021-02-10 MED ORDER — INSULIN ASPART 100 UNIT/ML IJ SOLN: 0.0000 [IU] | Freq: Three times a day (TID) | INTRAMUSCULAR | Status: DC

## 2021-02-10 MED ORDER — INSULIN GLARGINE-YFGN 100 UNIT/ML ~~LOC~~ SOLN
50.0000 [IU] | Freq: Every day | SUBCUTANEOUS | Status: DC
  Filled 2021-02-10: qty 0.5

## 2021-02-10 MED ORDER — INSULIN ASPART 100 UNIT/ML IJ SOLN: 10.0000 [IU] | Freq: Three times a day (TID) | INTRAMUSCULAR | Status: DC

## 2021-02-10 MED ORDER — BENZONATATE 100 MG PO CAPS
100.0000 mg | ORAL_CAPSULE | Freq: Three times a day (TID) | ORAL | Status: DC
  Administered 2021-02-10 – 2021-02-11 (×4): 100 mg via ORAL
  Filled 2021-02-10 (×4): qty 1

## 2021-02-10 NOTE — Plan of Care (Signed)

## 2021-02-10 NOTE — Progress Notes (Signed)
Patient ID: [name] [name], male   DOB: May 20, 1961, 60 y.o.   MRN: [id number] [facility] PROGRESS NOTE  Zandyr Meader [id number] DOB: 09/22/1960 DOA: 02/08/2021 PCP: [name], MD  HPI/Subjective: Patient feeling better with regards to his breathing.  Still has a little bit of wheezing.  Some cough.  Now off oxygen.  Patient states that he is mostly wheelchair-bound and only walks when he is holding onto something.  Patient admitted with COVID-19 infection and acute hypoxic respiratory failure.  Objective: Vitals:   02/10/21 0924 02/10/21 1200  BP: (!) 148/94 (!) 155/82  Pulse: 81 87  Resp: 19 17  Temp: 98 F (36.7 C) 97.9 F (36.6 C)  SpO2: 96% 96%    Intake/Output Summary (Last 24 hours) at 02/10/2021 1218 Last data filed at 02/10/2021 0440 Gross per 24 hour  Intake --  Output 940 ml  Net -940 ml   Filed Weights   02/08/21 1449 02/09/21 0500 02/10/21 0633  Weight: 94.3 kg 94.6 kg 93 kg    ROS: Review of Systems  Respiratory:  Positive for cough, shortness of breath and wheezing.   Cardiovascular:  Negative for chest pain.  Gastrointestinal:  Negative for abdominal pain, nausea and vomiting.  Exam: Physical Exam HENT:     Head: Normocephalic.     Mouth/Throat:     Pharynx: No oropharyngeal exudate.  Eyes:     General: Lids are normal.     Conjunctiva/sclera: Conjunctivae normal.  Cardiovascular:     Rate and Rhythm: Normal rate and regular rhythm.     Heart sounds: Normal heart sounds, S1 normal and S2 normal.  Pulmonary:     Breath sounds: Examination of the right-middle field reveals wheezing. Examination of the left-middle field reveals wheezing. Examination of the right-lower field reveals decreased breath sounds and rhonchi. Examination of the left-lower field reveals decreased breath sounds and rhonchi. Decreased breath sounds, wheezing and rhonchi present. No rales.  Abdominal:     Palpations: Abdomen is soft.     Tenderness: There is no  abdominal tenderness.  Musculoskeletal:     Right ankle: No swelling.     Left ankle: No swelling.  Skin:    General: Skin is warm.     Comments: Some scabs seen on lower extremity.  No signs of infection  Neurological:     Mental Status: He is alert and oriented to person, place, and time.      Scheduled Meds:  amLODipine  5 mg Oral Daily   vitamin C  500 mg Oral Daily   aspirin EC  81 mg Oral Daily   benzonatate  100 mg Oral TID   carvedilol  6.25 mg Oral BID WC   cholecalciferol  2,000 Units Oral Daily   ezetimibe  10 mg Oral Daily   folic acid  1 mg Oral Daily   guaiFENesin  1,200 mg Oral BID   heparin injection (subcutaneous)  5,000 Units Subcutaneous Q8H   insulin glargine-yfgn  110 Units Subcutaneous Daily   insulin lispro  0-20 Units Subcutaneous TID WC   insulin lispro  0-5 Units Subcutaneous QHS   insulin lispro  10 Units Subcutaneous TID WC   Ipratropium-Albuterol  1 puff Inhalation Q6H   isosorbide mononitrate  30 mg Oral Daily   methylPREDNISolone (SOLU-MEDROL) injection  40 mg Intravenous Q12H   multivitamin with minerals  1 tablet Oral Daily   nortriptyline  30 mg Oral QHS   pantoprazole  40 mg Oral BID AC  potassium chloride  20 mEq Oral BID   thiamine  100 mg Oral Daily   zinc sulfate  220 mg Oral Daily   Continuous Infusions:  remdesivir 100 mg in NS 100 mL 100 mg (02/10/21 0926)    Assessment/Plan:  COVID-19 infection with wheezing.  Continue Solu-Medrol and remdesivir at this point. Acute hypoxic respiratory failure.  This is resolved and patient is off oxygen. Elevated D-dimer.  VQ scan negative.  Ultrasound lower extremities negative. Elevated troponin likely demand ischemia from COVID-19 infection and acute hypoxic respiratory failure.  Discontinued heparin drip yesterday. Type 2 diabetes mellitus with hyperglycemia.  Sugars high secondary to steroids.  Patient back on his [place] Aas insulin 140 units in the evening.  Patient on short acting  insulin plus sliding scale Acute kidney injury on chronic kidney disease stage IV.  Creatinine 3.98 on presentation.  Down to 3.04 today Hyponatremia improving.  Sodium 133. Hypokalemia.  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  Hold IV fluids at this point.  Continue Coreg.  Holding medications that can affect kidney function. Essential hypertension on Coreg and Norvasc.        Code Status:     Code Status Orders  (From admission, onward)           Start     Ordered   02/08/21 1638  Full code  Continuous        02/08/21 1637           Code Status History     Date Active Date Inactive Code Status Order ID Comments User Context   08/31/2020 2007 09/01/2020 2102 Full Code [id number]  Marcelyn Bruins, MD ED      Advance Directive Documentation    [place] Most Recent Value  Type of Advance Directive Living will  Pre-existing out of facility DNR order (yellow form or pink MOST form) --  "MOST" Form in Place? --      Family Communication: Left message for patient's wife Disposition Plan: Status is: Inpatient  Dispo:  Patient From: Home  Planned Disposition: Home  Medically stable for discharge: No   Consultants: Cardiology  Time spent: 28 minutes  [place]

## 2021-02-10 NOTE — Progress Notes (Signed)
Patient very angry at beginning of shift that he did not receive his evening insulin yet.  He states that he usually gives himself tresiba at 1800.  He told nurse that insulin dose was nearly 2 hours late.  Nurse told patient that insulin had been ordered for later at 2200.  Patient stated this was unacceptable and that doctors were endangering his life by not controlling his blood sugar levels.  Patient had humolog insulin pen at bedside and nurse had brought tresiba insulin pen to bedside for patient's night time dose.  Nurse made patient aware that tresiba dose ordered was 55 units and humolog dose was 7 units per sliding scale.  Nurse went to get rest of meds and then returned to room to administer meds and observe patient self administer insulin.  On return to room, patient stated he had already given himself tresiba and humolog injections.  He stated he gave himself 140 units of tresiba and 21 units of humolog per his home regimen. Nurse told patient that his meds should be taken as ordered by hospitalist.  Patient stated he wanted to talk to the hospitalist tomorrow about his poor care at [facility] and stated he would leave AMA if he did not receive the answers he wanted.  Patient's actions and behaviors were reported to the hospitalist on duty.

## 2021-02-10 NOTE — Progress Notes (Signed)
Patient used bedside commode last night.  Patient said he was able to "walk" to the bathroom holding onto various things such as the bed, bedside commode and doorway to bathroom.  Patient was told this was very dangerous and this could result in a severe fall. Nurse said that he should have staff member at bedside when he gets up. Patient stated he did not want or need help getting to the bathroom.

## 2021-02-11 DIAGNOSIS — J9601 Acute respiratory failure with hypoxia: Secondary | ICD-10-CM | POA: Diagnosis not present

## 2021-02-11 DIAGNOSIS — E876 Hypokalemia: Secondary | ICD-10-CM

## 2021-02-11 DIAGNOSIS — R7989 Other specified abnormal findings of blood chemistry: Secondary | ICD-10-CM | POA: Diagnosis not present

## 2021-02-11 DIAGNOSIS — U071 COVID-19: Secondary | ICD-10-CM | POA: Diagnosis not present

## 2021-02-11 DIAGNOSIS — R778 Other specified abnormalities of plasma proteins: Secondary | ICD-10-CM | POA: Diagnosis not present

## 2021-02-11 LAB — CBC
HCT: 33.9 % — ABNORMAL LOW (ref 39.0–52.0)
Hemoglobin: 12.6 g/dL — ABNORMAL LOW (ref 13.0–17.0)
MCH: 32.6 pg (ref 26.0–34.0)
MCHC: 37.2 g/dL — ABNORMAL HIGH (ref 30.0–36.0)
MCV: 87.6 fL (ref 80.0–100.0)
Platelets: 365 10*3/uL (ref 150–400)
RBC: 3.87 MIL/uL — ABNORMAL LOW (ref 4.22–5.81)
RDW: 13.8 % (ref 11.5–15.5)
WBC: 12.1 10*3/uL — ABNORMAL HIGH (ref 4.0–10.5)
nRBC: 0 % (ref 0.0–0.2)

## 2021-02-11 LAB — BASIC METABOLIC PANEL
Anion gap: 13 (ref 5–15)
BUN: 74 mg/dL — ABNORMAL HIGH (ref 8–23)
CO2: 22 mmol/L (ref 22–32)
Calcium: 8.5 mg/dL — ABNORMAL LOW (ref 8.9–10.3)
Chloride: 99 mmol/L (ref 98–111)
Creatinine, Ser: 2.77 mg/dL — ABNORMAL HIGH (ref 0.61–1.24)
GFR, Estimated: 25 mL/min — ABNORMAL LOW (ref >60)
Glucose, Bld: 348 mg/dL — ABNORMAL HIGH (ref 70–99)
Potassium: 3 mmol/L — ABNORMAL LOW (ref 3.5–5.1)
Sodium: 134 mmol/L — ABNORMAL LOW (ref 135–145)

## 2021-02-11 LAB — GLUCOSE, CAPILLARY: Glucose-Capillary: 334 mg/dL — ABNORMAL HIGH (ref 70–99)

## 2021-02-11 MED ORDER — POTASSIUM CHLORIDE CRYS ER 20 MEQ PO TBCR: 20.0000 meq | EXTENDED_RELEASE_TABLET | Freq: Every day | ORAL | 0 refills | Status: DC

## 2021-02-11 MED ORDER — BENZONATATE 100 MG PO CAPS: 100.0000 mg | ORAL_CAPSULE | Freq: Three times a day (TID) | ORAL | 0 refills | Status: DC

## 2021-02-11 MED ORDER — ZINC SULFATE 220 (50 ZN) MG PO CAPS: 220.0000 mg | ORAL_CAPSULE | Freq: Every day | ORAL | 0 refills | Status: DC

## 2021-02-11 MED ORDER — ASCORBIC ACID 500 MG PO TABS: 500.0000 mg | ORAL_TABLET | Freq: Every day | ORAL | 0 refills | Status: DC

## 2021-02-11 MED ORDER — AMLODIPINE BESYLATE 10 MG PO TABS
10.0000 mg | ORAL_TABLET | Freq: Every day | ORAL | Status: DC
  Administered 2021-02-11: 10 mg via ORAL
  Filled 2021-02-11: qty 1

## 2021-02-11 MED ORDER — HYDROCOD POLST-CPM POLST ER 10-8 MG/5ML PO SUER: 5.0000 mL | Freq: Two times a day (BID) | ORAL | 0 refills | Status: DC | PRN

## 2021-02-11 MED ORDER — AMLODIPINE BESYLATE 10 MG PO TABS: 10.0000 mg | ORAL_TABLET | Freq: Every day | ORAL | 0 refills | Status: DC

## 2021-02-11 MED ORDER — INSULIN GLARGINE-YFGN 100 UNIT/ML ~~LOC~~ SOLN
110.0000 [IU] | Freq: Every day | SUBCUTANEOUS | Status: DC
  Filled 2021-02-11: qty 1.1

## 2021-02-11 MED ORDER — PREDNISONE 10 MG PO TABS: ORAL_TABLET | ORAL | 0 refills | Status: DC

## 2021-02-11 MED ORDER — THIAMINE HCL 100 MG PO TABS: 100.0000 mg | ORAL_TABLET | Freq: Every day | ORAL | 0 refills | Status: DC

## 2021-02-11 NOTE — Discharge Instructions (Signed)
Can restart torsemide '20mg'$  po daily in three days Rec check BMP in follow up appointment

## 2021-02-11 NOTE — TOC Transition Note (Signed)
Transition of Care [facility]) - CM/SW Discharge Note   Patient Details  Name: [name] [name] MRN: [id number] Date of Birth: 1960-09-19  [facility] CM/SW Contact:  Candie Chroman, LCSW Phone Number: 02/11/2021, 8:55 AM   Clinical Narrative:  Readmission prevention screen complete. Patient has orders to discharge home today. Patient on COVID isolation precautions. CSW called in the room, introduced role, and explained that discharge planning would be discussed. PCP is [name], MD. Wife transports to appointments. Pharmacy is [facility] in [place]. Patient reports history of issues getting insulin but stated he is switching endocrinologists so it should be worked out soon. No home health prior to admission. Patient has a cane, walker, rollator, wheelchair, an scooter at home. He uses the wheelchair inside and uses the scooter while outside the home. No further concerns. CSW signing off.  Final next level of care: Home/Self Care Barriers to Discharge: No Barriers Identified   Patient Goals and CMS Choice        Discharge Placement                Patient to be transferred to facility by: Wife will take him home.   Patient and family notified of of transfer: 02/11/21  Discharge Plan and Services     Post Acute Care Choice: NA                               Social Determinants of Health (SDOH) Interventions     Readmission Risk Interventions Readmission Risk Prevention Plan 02/11/2021  Transportation Screening Complete  PCP or Specialist Appt within 3-5 Days Complete  Social Work Consult for [place] Planning/Counseling [place] Not Applicable  Medication Review Press photographer) Complete  Some recent data might be hidden

## 2021-02-11 NOTE — Discharge Summary (Signed)
[facility] NAME: [name] [name]    MR#:  [id number]  DATE OF BIRTH:  1960-11-19  DATE OF ADMISSION:  02/08/2021 ADMITTING PHYSICIAN: Kayleen Memos, DO  DATE OF DISCHARGE: 02/11/2021 12:31 PM  PRIMARY CARE PHYSICIAN: [name], MD    ADMISSION DIAGNOSIS:  Hyperglycemia [R73.9] NSTEMI (non-ST elevated myocardial infarction) ([place]) [I21.4] Acute respiratory failure with hypoxia ([place]) [J96.01] AKI (acute kidney injury) ([place]) [N17.9] COVID-19 [U07.1]  DISCHARGE DIAGNOSIS:  Active Problems:   Acute respiratory failure with hypoxia (HCC)   COVID-19 virus infection   Elevated d-dimer   Elevated troponin   Acute kidney injury superimposed on CKD (HCC)   Hyponatremia   Chronic systolic CHF (congestive heart failure) ([place])   Wheeze   SECONDARY DIAGNOSIS:   Past Medical History:  Diagnosis Date  . Arthritis   . CAD (coronary artery disease)    a. 12/2016 s/p CABG x 4 (LIMA->LAD, VG->RCA, VG->OM1, VG->D1); b. 02/2018 MV: EF 35%, small-med inferolaterlal infarct. No ischemia.  . CKD (chronic kidney disease), stage IV ([place])   . Colon polyps   . Diabetes ([place])   . Dupre's syndrome   . GERD (gastroesophageal reflux disease)   . HFrEF (heart failure reduced ejection fraction) ([place])    a. 2018 EF 35%; b. 02/2018 EF 50%; c. 02/2019 EF 40-45%; d. 08/2019 Echo: EF 50-55%, no rwma, GrII DD; e. 04/2020 Echo: EF 30-35%, mod-sev glob HK. Mild LVH. GrII DD. Low-nl RV fxn. Mildly dil LA. Mild MR. Mild-mod Ao sclerosis w/o stenosis.  . Hyperlipidemia   . Hypertension   . Ischemic cardiomyopathy    a. 02/2018 MV: EF 35%; b. 08/2019 Echo: EF 50-55%; c. 04/2020 Echo: EF 30-35%.  . Lymphedema   . Migraine   . Neuropathy   . PAD (peripheral artery disease) ([place])    a. 04/2017 Aortoiliac duplex: R iliac dzs; b. 03/2020 LE Duplex: mild bilat atherosclerosis throughout. Patent vessels.  . Retinopathy   . Sleep apnea   . Stomach ulcer      HOSPITAL COURSE:   COVID-19 infection with wheezing and acute hypoxic respiratory failure.  Was given 3 days of remdesivir and Solu-Medrol.  The patient will be given a prednisone taper. Acute hypoxic respiratory failure.  This has resolved and the patient is off oxygen. Elevated D-dimer.  VQ scan negative.  Ultrasound the lower extremities negative. Elevated troponin likely secondary to demand ischemia from COVID-19 infection acute hypoxic respiratory failure.  Patient was initially started on heparin drip which was discontinued after VQ scan negative. Type 2 diabetes mellitus with hyperglycemia.  Sugars were very high upon coming in because they did not give the nighttime insulin.  His sugars over 500.  We restarted his insulin and short acting insulin.  Sugars remained in the 200-300 range.  Should improve once prednisone is tapered to off.  Hemoglobin A1c a few months ago 13.2.  The patient takes 140 mg of Tresiba insulin daily plus short acting insulin prior to meals. Acute kidney injury on chronic kidney disease stage IV.  Creatinine 3.98 on presentation and down to 2.77 which is close to his baseline.  We held his torsemide and metolazone here in the hospital.  Can likely go back on low-dose torsemide and 3 days.  Continue to hold metolazone.  Recommend checking a BMP and follow-up appointment. Hyponatremia on presentation.  Sodium 127.  Up to 134 upon discharge. Hypokalemia.  Continue potassium supplementation on a daily basis Chronic  systolic congestive heart failure with cardiomyopathy.  Initially the patient was given IV fluids.  I discontinued the IV fluids soon after coming in and let the creatinine improve on its own.  No signs of heart failure currently.  Continue Coreg. Essential hypertension on Coreg and Norvasc.  I did go up on his Norvasc to 10 mg daily. Weakness.  Patient refused physical therapy evaluation.  He gets around with wheelchair or holding onto things.  DISCHARGE  CONDITIONS:   Satisfactory  CONSULTS OBTAINED:    None  DRUG ALLERGIES:   Allergies  Allergen Reactions  . Contrast Media [Iodinated Diagnostic Agents]     Kidney disease   . Other Other (See Comments)    Pain  With joint stiffness    . Statins     Pain  With joint stiffness   . Atorvastatin Other (See Comments)    Muscle aches Muscle aches  . Entresto [Sacubitril-Valsartan]     Hyperkalemia   . Pravastatin Other (See Comments)    Muscle aches Muscle aches  . Pregabalin Other (See Comments)    Generalized aches and pains Generalized aches and pains  . Rosuvastatin Other (See Comments)    Muscle Aches Other reaction(s): JOINT PAIN Other reaction(s): JOINT PAIN Muscle Aches     DISCHARGE MEDICATIONS:   Allergies as of 02/11/2021       Reactions   Contrast Media [iodinated Diagnostic Agents]    Kidney disease    Other Other (See Comments)   Pain  With joint stiffness    Statins    Pain  With joint stiffness    Atorvastatin Other (See Comments)   Muscle aches Muscle aches   Entresto [sacubitril-valsartan]    Hyperkalemia   Pravastatin Other (See Comments)   Muscle aches Muscle aches   Pregabalin Other (See Comments)   Generalized aches and pains Generalized aches and pains   Rosuvastatin Other (See Comments)   Muscle Aches Other reaction(s): JOINT PAIN Other reaction(s): JOINT PAIN Muscle Aches        Medication List     STOP taking these medications    metolazone 5 MG tablet Commonly known as: ZAROXOLYN   torsemide 20 MG tablet Commonly known as: DEMADEX       TAKE these medications    acetaminophen-codeine 300-30 MG tablet Commonly known as: TYLENOL #3 TAKE ONE TAB PO TID FOR COUGH   amLODipine 10 MG tablet Commonly known as: NORVASC Take 1 tablet (10 mg total) by mouth daily. Start taking on: February 12, 2021 What changed:  medication strength how much to take Notes to patient: Last dose 9/18 AM   ascorbic acid 500 MG  tablet Commonly known as: VITAMIN C Take 1 tablet (500 mg total) by mouth daily. Start taking on: February 12, 2021 Notes to patient: Last dose 9/18 AM   aspirin EC 81 MG tablet Take 81 mg by mouth daily. Swallow whole. Notes to patient: Last dose 9/18 AM   augmented betamethasone dipropionate 0.05 % ointment Commonly known as: Diprolene Apply topically 2 (two) times daily. Notes to patient: You were not given this medication during your visit. Resume home regimen.   B-D ULTRAFINE III SHORT PEN 31G X 8 MM Misc Generic drug: Insulin Pen Needle To use with pen basal and mealtime coverage insulin pens 5 times daily. DX: E11.65   benzonatate 100 MG capsule Commonly known as: TESSALON Take 1 capsule (100 mg total) by mouth 3 (three) times daily. Notes to patient: Last dose 9/18  AM   carvedilol 12.5 MG tablet Commonly known as: COREG Take 12.5 mg by mouth 2 (two) times daily with a meal. Notes to patient: Last dose 9/18 AM   chlorpheniramine-HYDROcodone 10-8 MG/5ML Suer Commonly known as: TUSSIONEX Take 5 mLs by mouth every 12 (twelve) hours as needed for cough.   cholecalciferol 25 MCG (1000 UNIT) tablet Commonly known as: VITAMIN D3 Take 2,000 Units by mouth daily. Notes to patient: Last dose 9/18 AM   ezetimibe 10 MG tablet Commonly known as: ZETIA Take 1 tablet (10 mg total) by mouth daily. Notes to patient: Last dose 9/18 AM   insulin lispro 100 UNIT/ML KwikPen Commonly known as: HUMALOG Inject 20 Units into the skin daily. 0-20 units per sliding scale   isosorbide mononitrate 30 MG 24 hr tablet Commonly known as: IMDUR Take 1 tablet (30 mg total) by mouth daily. Notes to patient: Last dose 9/18 AM   metoCLOPramide 5 MG tablet Commonly known as: Reglan One tab before each meal and one at bed time for dyspepsia prn   nortriptyline 10 MG capsule Commonly known as: PAMELOR Take 30 mg by mouth at bedtime. Notes to patient: Last dose 9/17 Bedtime   pantoprazole  40 MG tablet Commonly known as: PROTONIX Take 1 tablet (40 mg total) by mouth 2 (two) times daily before a meal. Notes to patient: Last dose 9/18 AM   potassium chloride SA 20 MEQ tablet Commonly known as: KLOR-CON Take 1 tablet (20 mEq total) by mouth daily. Notes to patient: Last dose 9/18 AM   Praluent 150 MG/ML Soaj Generic drug: Alirocumab Inject 150 mg into the skin every 14 (fourteen) days. Notes to patient: Resume home regimen   predniSONE 10 MG tablet Commonly known as: DELTASONE 4 tabs po day1; 3 tabs po day 2; 2 tabs po day3; 1 tab po day4; 1/2 tab po day5,6 Notes to patient: May start today   thiamine 100 MG tablet Take 1 tablet (100 mg total) by mouth daily. Notes to patient: Last dose 9/18 AM   Tyler Aas FlexTouch 200 UNIT/ML FlexTouch Pen Generic drug: insulin degludec Inject 140 Units into the skin daily. What changed: Another medication with the same name was removed. Continue taking this medication, and follow the directions you see here.   zinc sulfate 220 (50 Zn) MG capsule Take 1 capsule (220 mg total) by mouth daily. Notes to patient: Last dose 9/18 AM       Patient has an albuterol inhaler at home.  DISCHARGE INSTRUCTIONS:   Follow-up PMD 5 days  If you experience worsening of your admission symptoms, develop shortness of breath, life threatening emergency, suicidal or homicidal thoughts you must seek medical attention immediately by calling 911 or calling your MD immediately  if symptoms less severe.  You Must read complete instructions/literature along with all the possible adverse reactions/side effects for all the Medicines you take and that have been prescribed to you. Take any new Medicines after you have completely understood and accept all the possible adverse reactions/side effects.   Please note  You were cared for by a hospitalist during your hospital stay. If you have any questions about your discharge medications or the care you  received while you were in the hospital after you are discharged, you can call the unit and asked to speak with the hospitalist on call if the hospitalist that took care of you is not available. Once you are discharged, your primary care physician will handle any further medical issues. Please  note that NO REFILLS for any discharge medications will be authorized once you are discharged, as it is imperative that you return to your primary care physician (or establish a relationship with a primary care physician if you do not have one) for your aftercare needs so that they can reassess your need for medications and monitor your lab values.    Today   CHIEF COMPLAINT:   Chief Complaint  Patient presents with  . Chest Pain    HISTORY OF PRESENT ILLNESS:  Nevan Tewalt  is a 60 y.o. male came in with shortness of breath and found to have COVID-19 infection   VITAL SIGNS:  Blood pressure (!) 168/77, pulse 92, temperature 98 F (36.7 C), temperature source Oral, resp. rate 15, height '6\' 2"'$  (1.88 m), weight 93 kg, SpO2 97 %.   PHYSICAL EXAMINATION:  GENERAL:  60 y.o.-year-old patient lying in the bed with no acute distress.  EYES: Pupils equal, round, reactive to light and accommodation. No scleral icterus. HEENT: Head atraumatic, normocephalic. Oropharynx and nasopharynx clear.  LUNGS: Decreased breath sounds bilateral bases, no wheezing, rales,rhonchi or crepitation. No use of accessory muscles of respiration.  CARDIOVASCULAR: S1, S2 normal. No murmurs, rubs, or gallops.  ABDOMEN: Soft, non-tender, non-distended.  EXTREMITIES: No pedal edema.  NEUROLOGIC: Cranial nerves II through XII are intact. Muscle strength 5/5 in all extremities. Sensation intact. Gait not checked.  PSYCHIATRIC: The patient is alert and oriented x 3.  SKIN: Chronic bilateral lower extremity skin discoloration over her shins  DATA REVIEW:   CBC Recent Labs  Lab 02/11/21 0631  WBC 12.1*  HGB 12.6*  HCT 33.9*   PLT 365    Chemistries  Recent Labs  Lab 02/09/21 0129 02/10/21 0435 02/11/21 0631  NA 127* 133* 134*  K 3.0* 3.3* 3.0*  CL 88* 94* 99  CO2 '24 26 22  '$ GLUCOSE 449* 275* 348*  BUN 60* 71* 74*  CREATININE 3.44* 3.04* 2.77*  CALCIUM 8.5* 8.8* 8.5*  MG 2.1  --   --   AST 24 40  --   ALT 22 25  --   ALKPHOS 59 68  --   BILITOT 1.2 0.7  --      Microbiology Results  Results for orders placed or performed during the hospital encounter of 02/08/21  Resp Panel by RT-PCR (Flu A&B, Covid) Nasopharyngeal Swab     Status: Abnormal   Collection Time: 02/08/21  4:34 PM   Specimen: Nasopharyngeal Swab; Nasopharyngeal(NP) swabs in vial transport medium  Result Value Ref Range Status   SARS Coronavirus 2 by RT PCR POSITIVE (A) NEGATIVE Final    Comment: RESULT CALLED TO, READ BACK BY AND VERIFIED WITH: JESS ANDERSON '@1750'$  02/08/21 MJU (NOTE) SARS-CoV-2 target nucleic acids are DETECTED.  The SARS-CoV-2 RNA is generally detectable in upper respiratory specimens during the acute phase of infection. Positive results are indicative of the presence of the identified virus, but do not rule out bacterial infection or co-infection with other pathogens not detected by the test. Clinical correlation with patient history and other diagnostic information is necessary to determine patient infection status. The expected result is Negative.  Fact Sheet for Patients: EntrepreneurPulse.com.au  Fact Sheet for Healthcare Providers: IncredibleEmployment.be  This test is not yet approved or cleared by the [place] FDA and  has been authorized for detection and/or diagnosis of SARS-CoV-2 by FDA under an Emergency Use Authorization (EUA).  This EUA will remain in effect (meaning this test can be u sed)  for the duration of  the COVID-19 declaration under Section 564(b)(1) of the Act, 21 U.S.C. section 360bbb-3(b)(1), unless the authorization is terminated or  revoked sooner.     Influenza A by PCR NEGATIVE NEGATIVE Final   Influenza B by PCR NEGATIVE NEGATIVE Final    Comment: (NOTE) The Xpert Xpress SARS-CoV-2/FLU/RSV plus assay is intended as an aid in the diagnosis of influenza from Nasopharyngeal swab specimens and should not be used as a sole basis for treatment. Nasal washings and aspirates are unacceptable for Xpert Xpress SARS-CoV-2/FLU/RSV testing.  Fact Sheet for Patients: EntrepreneurPulse.com.au  Fact Sheet for Healthcare Providers: IncredibleEmployment.be  This test is not yet approved or cleared by the [place] FDA and has been authorized for detection and/or diagnosis of SARS-CoV-2 by FDA under an Emergency Use Authorization (EUA). This EUA will remain in effect (meaning this test can be used) for the duration of the COVID-19 declaration under Section 564(b)(1) of the Act, 21 U.S.C. section 360bbb-3(b)(1), unless the authorization is terminated or revoked.  Performed at [facility], [address], [place], [place] [address]      Management plans discussed with the patient, family and they are in agreement.  CODE STATUS:     Code Status Orders  (From admission, onward)           Start     Ordered   02/08/21 1638  Full code  Continuous        02/08/21 1637           Code Status History     Date Active Date Inactive Code Status Order ID Comments User Context   08/31/2020 2007 09/01/2020 2102 Full Code [id number]  Marcelyn Bruins, MD ED      Advance Directive Documentation    [place] Most Recent Value  Type of Advance Directive Living will  Pre-existing out of facility DNR order (yellow form or pink MOST form) --  "MOST" Form in Place? --       TOTAL TIME TAKING CARE OF THIS PATIENT: 32 minutes.    Loletha Grayer M.D on 02/11/2021 at 2:51 PM  [facility]  CC: Primary care physician; [name], MD

## 2021-02-12 ENCOUNTER — Other Ambulatory Visit: Payer: Self-pay

## 2021-02-12 MED ORDER — CARVEDILOL 12.5 MG PO TABS: 12.5000 mg | ORAL_TABLET | Freq: Two times a day (BID) | ORAL | 2 refills | Status: DC

## 2021-02-12 NOTE — Telephone Encounter (Signed)
Please advise if ok to refill Historical medication. 

## 2021-02-12 NOTE — Telephone Encounter (Signed)
*  STAT* If patient is at the pharmacy, call can be transferred to refill team.   1. Which medications need to be refilled? (please list name of each medication and dose if known) Carvedilol 2. Which pharmacy/location (including street and city if local pharmacy) is medication to be sent to? [facility]  3. Do they need a 30 day or 90 day supply? [place]

## 2021-02-15 ENCOUNTER — Telehealth (INDEPENDENT_AMBULATORY_CARE_PROVIDER_SITE_OTHER): Payer: Managed Care, Other (non HMO) | Admitting: Nurse Practitioner

## 2021-02-15 ENCOUNTER — Other Ambulatory Visit: Payer: Self-pay

## 2021-02-15 VITALS — BP 172/101 | HR 91 | Temp 98.8°F | Resp 16 | Ht 74.0 in | Wt 205.0 lb

## 2021-02-15 DIAGNOSIS — Z09 Encounter for follow-up examination after completed treatment for conditions other than malignant neoplasm: Secondary | ICD-10-CM

## 2021-02-15 DIAGNOSIS — I5022 Chronic systolic (congestive) heart failure: Secondary | ICD-10-CM

## 2021-02-15 DIAGNOSIS — N1832 Chronic kidney disease, stage 3b: Secondary | ICD-10-CM | POA: Diagnosis not present

## 2021-02-15 DIAGNOSIS — E876 Hypokalemia: Secondary | ICD-10-CM | POA: Diagnosis not present

## 2021-02-15 DIAGNOSIS — J9601 Acute respiratory failure with hypoxia: Secondary | ICD-10-CM

## 2021-02-15 DIAGNOSIS — I10 Essential (primary) hypertension: Secondary | ICD-10-CM | POA: Diagnosis not present

## 2021-02-15 DIAGNOSIS — E871 Hypo-osmolality and hyponatremia: Secondary | ICD-10-CM

## 2021-02-15 DIAGNOSIS — E1140 Type 2 diabetes mellitus with diabetic neuropathy, unspecified: Secondary | ICD-10-CM

## 2021-02-15 DIAGNOSIS — Z794 Long term (current) use of insulin: Secondary | ICD-10-CM

## 2021-02-15 MED ORDER — INSULIN LISPRO (1 UNIT DIAL) 100 UNIT/ML (KWIKPEN): PEN_INJECTOR | SUBCUTANEOUS | 3 refills | Status: DC

## 2021-02-15 MED ORDER — TRESIBA FLEXTOUCH 200 UNIT/ML ~~LOC~~ SOPN: 140.0000 [IU] | PEN_INJECTOR | Freq: Every day | SUBCUTANEOUS | 5 refills | Status: DC

## 2021-02-15 NOTE — Progress Notes (Signed)
[facility] [place] [address] [facility] Discharge Acute Issues Care Follow Up                                                                        Patient Demographics  [name] [name], is a 60 y.o. male  DOB 05-07-1961  MRN [id number].  Primary MD  [name], MD  I connected with  [name] [name] on 02/15/21 by a video enabled telemedicine application and verified that I am speaking with the correct person using two identifiers.   I discussed the limitations of evaluation and management by telemedicine. The patient expressed understanding and agreed to proceed.  Reason for TCC follow Up - Acute respiratory failure with hypoxia, chronic systolic CHF, AKI, [id number] infection   Past Medical History:  Diagnosis Date   Arthritis    CAD (coronary artery disease)    a. 12/2016 s/p CABG x 4 (LIMA->LAD, VG->RCA, VG->OM1, VG->D1); b. 02/2018 MV: EF 35%, small-med inferolaterlal infarct. No ischemia.   CKD (chronic kidney disease), stage IV (HCC)    Colon polyps    Diabetes ([place])    Dupre's syndrome    GERD (gastroesophageal reflux disease)    HFrEF (heart failure reduced ejection fraction) ([place])    a. 2018 EF 35%; b. 02/2018 EF 50%; c. 02/2019 EF 40-45%; d. 08/2019 Echo: EF 50-55%, no rwma, GrII DD; e. 04/2020 Echo: EF 30-35%, mod-sev glob HK. Mild LVH. GrII DD. Low-nl RV fxn. Mildly dil LA. Mild MR. Mild-mod Ao sclerosis w/o stenosis.   Hyperlipidemia    Hypertension    Ischemic cardiomyopathy    a. 02/2018 MV: EF 35%; b. 08/2019 Echo: EF 50-55%; c. 04/2020 Echo: EF 30-35%.   Lymphedema    Migraine    Neuropathy    PAD (peripheral artery disease) ([place])    a. 04/2017 Aortoiliac duplex: R iliac dzs; b. 03/2020 LE Duplex: mild bilat atherosclerosis throughout. Patent vessels.   Retinopathy    Sleep apnea    Stomach ulcer     Past Surgical  History:  Procedure Laterality Date   bone graft surgery     CARDIAC CATHETERIZATION  2013   S/p PCI    CARDIAC CATHETERIZATION  2018   S/p CABG   CARDIAC SURGERY     cataract surgery Bilateral    CORONARY ARTERY BYPASS GRAFT  2018   (LIMA-LAD,VG-RCA,VG-OM1,VG-D1)   ESOPHAGOGASTRODUODENOSCOPY (EGD) WITH PROPOFOL N/A 04/04/2020   Procedure: ESOPHAGOGASTRODUODENOSCOPY (EGD) WITH PROPOFOL;  Surgeon: Lucilla Lame, MD;  Location: [facility] ENDOSCOPY;  Service: Endoscopy;  Laterality: N/A;   ESOPHAGOGASTRODUODENOSCOPY (EGD) WITH PROPOFOL N/A 06/06/2020   Procedure: ESOPHAGOGASTRODUODENOSCOPY (EGD) WITH PROPOFOL;  Surgeon: Lucilla Lame, MD;  Location: [facility] ENDOSCOPY;  Service: Endoscopy;  Laterality: N/A;   EYE SURGERY     PERIPHERAL VASCULAR CATHETERIZATION Right 10/28/2016   PTA/DEB Right SFA   WRIST SURGERY         Recent HPI and Hospital Course  COVID-19 infection with wheezing and acute hypoxic  respiratory failure.  Was given 3 days of remdesivir and Solu-Medrol.  The patient will be given a prednisone taper. Acute hypoxic respiratory failure.  This has resolved and the patient is off oxygen. Elevated D-dimer.  VQ scan negative.  Ultrasound the lower extremities negative. Elevated troponin likely secondary to demand ischemia from COVID-19 infection acute hypoxic respiratory failure.  Patient was initially started on heparin drip which was discontinued after VQ scan negative. Type 2 diabetes mellitus with hyperglycemia.  Sugars were very high upon coming in because they did not give the nighttime insulin.  His sugars over 500.  We restarted his insulin and short acting insulin.  Sugars remained in the 200-300 range.  Should improve once prednisone is tapered to off.  Hemoglobin A1c a few months ago 13.2.  The patient takes 140 mg of Tresiba insulin daily plus short acting insulin prior to meals. Acute kidney injury on chronic kidney disease stage IV.  Creatinine 3.98 on presentation and down  to 2.77 which is close to his baseline.  We held his torsemide and metolazone here in the hospital.  Can likely go back on low-dose torsemide and 3 days.  Continue to hold metolazone.  Recommend checking a BMP and follow-up appointment. Hyponatremia on presentation.  Sodium 127.  Up to 134 upon discharge. Hypokalemia.  Continue potassium supplementation on a daily basis Chronic systolic congestive heart failure with cardiomyopathy.  Initially the patient was given IV fluids.  I discontinued the IV fluids soon after coming in and let the creatinine improve on its own.  No signs of heart failure currently.  Continue Coreg. Essential hypertension on Coreg and Norvasc.  I did go up on his Norvasc to 10 mg daily. Weakness.  Patient refused physical therapy evaluation.  He gets around with wheelchair or holding onto things.  [facility] Acute Care Issue to be followed in the Clinic   Acute respiratory failure with hypoxia ([place])   COVID-19 virus infection   Elevated d-dimer   Elevated troponin   Acute kidney injury superimposed on CKD (HCC)   Hyponatremia   Chronic systolic CHF (congestive heart failure) ([place])   Wheeze   Subjective:   [name] [name] today has, No headache, No chest pain, No abdominal pain - No Nausea, No new weakness tingling or numbness. He has a lingering cough and intermittent SOB due to recent COVID infection. Patient is aware that the cough can linger for 3 or more weeks and there is a risk of continuing to have symptoms or chronic problems post-covid for an extended period of time. He reports that he is feeling much better now than he was when he was in the hospital. He was supposed to meet with a new endocrinologist but got COVID infection and had to cancel. He hopes to reschedule with this specialist when he is feeling up to going. His office visit today was changed from in-person to virtual due to the patient not feeling well enough to come to the office in person.    Assessment & Plan    1. Type 2 diabetes mellitus with diabetic neuropathy, with long-term current use of insulin (HCC) Diabetes is difficult to control and poorly controlled. He will need to reschedule with new endocrinologist. Refills provided as ordered.  - insulin degludec (TRESIBA FLEXTOUCH) 200 UNIT/ML FlexTouch Pen; Inject 140 Units into the skin daily.  Dispense: 21 mL; Refill: 5 - insulin lispro (HUMALOG) 100 UNIT/ML KwikPen; 20-40 units per sliding scale  Dispense: 15 mL; Refill: 3  2. Stage 3b chronic kidney disease ([place]) He had an AKI superimposed on current stage 3b CKD. Kidney function is decreased but stable at this time. Will monitor periodically.   3. Essential hypertension Blood pressure is significantly elevated per patient report. Unable to recheck blood pressure manually due to this being a virtual visit. Patient encouraged to continued his blood pressure medications as prescribed  4. Hypokalemia Low potassium level was noted on labs during hospital stay, BMP ordered to assess for resolution.  - Basic Metabolic Panel (BMET)  5. Hyponatremia Sodium level was low during hospital stay, metabolic panel ordered to reevaluate this level and assess for resolution.  - Basic Metabolic Panel (BMET)  6. Acute respiratory failure with hypoxia (HCC) Stable at this time, will follow up with patient in office as soon as he is feeling well enough to come into the office.   7. Chronic systolic CHF (congestive heart failure) (HCC) Stable, patient and wife are aware of what to look out for as warning signs that he is having increased fluid retention, when to call the cardiologist or PCP as well as when to go to the hospital.   8. Encounter for examination following treatment at hospital Transition of care management after discharge from [facility] for acute respiratory failure with hypoxia. He is adjusting well to being home.will recheck metabolic panel to reevaluate abnormals. Refills  ordered as appropriate.      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      Objective:   Vitals:   02/15/21 1338  BP: (!) 172/101  Pulse: 91  Resp: 16  Temp: 98.8 F (37.1 C)  SpO2: 91%  Weight: 205 lb (93 kg)  Height: '6\' 2"'$  (1.88 m)    Wt Readings from Last 3 Encounters:  02/15/21 205 lb (93 kg)  02/10/21 205 lb 0.4 oz (93 kg)  01/30/21 208 lb 12.8 oz (94.7 kg)    Allergies as of 02/15/2021       Reactions   Contrast Media [iodinated Diagnostic Agents]    Kidney disease    Other Other (See Comments)   Pain  With joint stiffness    Statins    Pain  With joint stiffness    Atorvastatin Other (See Comments)   Muscle aches Muscle aches   Entresto [sacubitril-valsartan]    Hyperkalemia   Pravastatin Other (See Comments)   Muscle aches Muscle aches   Pregabalin Other (See Comments)   Generalized aches and pains Generalized aches and pains   Rosuvastatin Other (See Comments)   Muscle Aches Other reaction(s): JOINT PAIN Other reaction(s): JOINT PAIN Muscle Aches        Medication List        Accurate as of February 15, 2021  1:55 PM. If you have any questions, ask your nurse or doctor.          STOP taking these medications    thiamine 100 MG tablet Stopped by: Jonetta Osgood, NP   zinc sulfate 220 (50 Zn) MG capsule Stopped by: Jonetta Osgood, NP       TAKE these medications    acetaminophen-codeine 300-30 MG tablet Commonly known as: TYLENOL #3 TAKE ONE TAB PO TID FOR COUGH   amLODipine 10 MG tablet Commonly known as: NORVASC Take 1 tablet (10 mg total) by mouth daily.   ascorbic acid 500 MG tablet Commonly known as: VITAMIN C Take  1 tablet (500 mg total) by mouth daily.   aspirin EC 81 MG tablet Take 81 mg by mouth daily. Swallow whole.   augmented  betamethasone dipropionate 0.05 % ointment Commonly known as: Diprolene Apply topically 2 (two) times daily.   B-D ULTRAFINE III SHORT PEN 31G X 8 MM Misc Generic drug: Insulin Pen Needle To use with pen basal and mealtime coverage insulin pens 5 times daily. DX: E11.65   benzonatate 100 MG capsule Commonly known as: TESSALON Take 1 capsule (100 mg total) by mouth 3 (three) times daily.   carvedilol 12.5 MG tablet Commonly known as: COREG Take 1 tablet (12.5 mg total) by mouth 2 (two) times daily with a meal.   chlorpheniramine-HYDROcodone 10-8 MG/5ML Suer Commonly known as: TUSSIONEX Take 5 mLs by mouth every 12 (twelve) hours as needed for cough.   cholecalciferol 25 MCG (1000 UNIT) tablet Commonly known as: VITAMIN D3 Take 2,000 Units by mouth daily.   ezetimibe 10 MG tablet Commonly known as: ZETIA Take 1 tablet (10 mg total) by mouth daily.   furosemide 20 MG tablet Commonly known as: LASIX Take 20 mg by mouth 2 (two) times daily.   insulin lispro 100 UNIT/ML KwikPen Commonly known as: HUMALOG Inject 20 Units into the skin daily. 0-20 units per sliding scale   isosorbide mononitrate 30 MG 24 hr tablet Commonly known as: IMDUR Take 1 tablet (30 mg total) by mouth daily.   metoCLOPramide 5 MG tablet Commonly known as: Reglan One tab before each meal and one at bed time for dyspepsia prn   metolazone 5 MG tablet Commonly known as: ZAROXOLYN Take 5 mg by mouth. Take twice a week when needed with lasix.  Told from hospital to not take   nortriptyline 10 MG capsule Commonly known as: PAMELOR Take 30 mg by mouth at bedtime.   pantoprazole 40 MG tablet Commonly known as: PROTONIX Take 1 tablet (40 mg total) by mouth 2 (two) times daily before a meal.   potassium chloride SA 20 MEQ tablet Commonly known as: KLOR-CON Take 1 tablet (20 mEq total) by mouth daily.   Praluent 150 MG/ML Soaj Generic drug: Alirocumab Inject 150 mg into the skin every 14 (fourteen)  days.   predniSONE 10 MG tablet Commonly known as: DELTASONE 4 tabs po day1; 3 tabs po day 2; 2 tabs po day3; 1 tab po day4; 1/2 tab po day5,6   Tresiba FlexTouch 200 UNIT/ML FlexTouch Pen Generic drug: insulin degludec Inject 140 Units into the skin daily.         Physical exam: Unable to perform physical exam due to this being a virtual visit. Although, by observation patient is alert and oriented. He engages in conversation appropriately and does not appear to be in any acute distress over video call.    Data Review   Micro Results Recent Results (from the past 240 hour(s))  Resp Panel by RT-PCR (Flu A&B, Covid) Nasopharyngeal Swab     Status: Abnormal   Collection Time: 02/08/21  4:34 PM   Specimen: Nasopharyngeal Swab; Nasopharyngeal(NP) swabs in vial transport medium  Result Value Ref Range Status   SARS Coronavirus 2 by RT PCR POSITIVE (A) NEGATIVE Final    Comment: RESULT CALLED TO, READ BACK BY AND VERIFIED WITH: JESS ANDERSON '@1750'$  02/08/21 MJU (NOTE) SARS-CoV-2 target nucleic acids are DETECTED.  The SARS-CoV-2 RNA is generally detectable in upper respiratory specimens during the acute phase of infection. Positive results are indicative of the presence of the  identified virus, but do not rule out bacterial infection or co-infection with other pathogens not detected by the test. Clinical correlation with patient history and other diagnostic information is necessary to determine patient infection status. The expected result is Negative.  Fact Sheet for Patients: EntrepreneurPulse.com.au  Fact Sheet for Healthcare Providers: IncredibleEmployment.be  This test is not yet approved or cleared by the [place] FDA and  has been authorized for detection and/or diagnosis of SARS-CoV-2 by FDA under an Emergency Use Authorization (EUA).  This EUA will remain in effect (meaning this test can be u sed) for the duration of  the  COVID-19 declaration under Section 564(b)(1) of the Act, 21 U.S.C. section 360bbb-3(b)(1), unless the authorization is terminated or revoked sooner.     Influenza A by PCR NEGATIVE NEGATIVE Final   Influenza B by PCR NEGATIVE NEGATIVE Final    Comment: (NOTE) The Xpert Xpress SARS-CoV-2/FLU/RSV plus assay is intended as an aid in the diagnosis of influenza from Nasopharyngeal swab specimens and should not be used as a sole basis for treatment. Nasal washings and aspirates are unacceptable for Xpert Xpress SARS-CoV-2/FLU/RSV testing.  Fact Sheet for Patients: EntrepreneurPulse.com.au  Fact Sheet for Healthcare Providers: IncredibleEmployment.be  This test is not yet approved or cleared by the [place] FDA and has been authorized for detection and/or diagnosis of SARS-CoV-2 by FDA under an Emergency Use Authorization (EUA). This EUA will remain in effect (meaning this test can be used) for the duration of the COVID-19 declaration under Section 564(b)(1) of the Act, 21 U.S.C. section 360bbb-3(b)(1), unless the authorization is terminated or revoked.  Performed at [facility], [address] [place], [place] [address]      CBC Recent Labs  Lab 02/08/21 1455 02/09/21 0129 02/10/21 0435 02/11/21 0631  WBC 12.3* 9.5 9.9 12.1*  HGB 13.7 12.1* 12.9* 12.6*  HCT 37.7* 33.1* 35.4* 33.9*  PLT 339 325 377 365  MCV 85.9 86.2 86.3 87.6  MCH 31.2 31.5 31.5 32.6  MCHC 36.3* 36.6* 36.4* 37.2*  RDW 13.5 13.5 13.6 13.8    Chemistries  Recent Labs  Lab 02/08/21 1455 02/09/21 0129 02/10/21 0435 02/11/21 0631  NA 127* 127* 133* 134*  K 3.1* 3.0* 3.3* 3.0*  CL 84* 88* 94* 99  CO2 '24 24 26 22  '$ GLUCOSE 419* 449* 275* 348*  BUN 59* 60* 71* 74*  CREATININE 3.98* 3.44* 3.04* 2.77*  CALCIUM 8.8* 8.5* 8.8* 8.5*  MG  --  2.1  --   --   AST  --  24 40  --   ALT  --  22 25  --   ALKPHOS  --  59 68  --   BILITOT  --  1.2 0.7  --     ------------------------------------------------------------------------------------------------------------------ estimated creatinine clearance is 32.6 mL/min (A) (by C-G formula based on SCr of 2.77 mg/dL (H)). ------------------------------------------------------------------------------------------------------------------ No results for input(s): HGBA1C in the last 72 hours. ------------------------------------------------------------------------------------------------------------------ No results for input(s): CHOL, HDL, LDLCALC, TRIG, CHOLHDL, LDLDIRECT in the last 72 hours. ------------------------------------------------------------------------------------------------------------------ No results for input(s): TSH, T4TOTAL, T3FREE, THYROIDAB in the last 72 hours.  Invalid input(s): FREET3 ------------------------------------------------------------------------------------------------------------------ No results for input(s): VITAMINB12, FOLATE, FERRITIN, TIBC, IRON, RETICCTPCT in the last 72 hours.  Coagulation profile Recent Labs  Lab 02/08/21 1724  INR 1.4*    No results for input(s): DDIMER in the last 72 hours.  Cardiac Enzymes No results for input(s): CKMB, TROPONINI, MYOGLOBIN in the last 168 hours.  Invalid input(s): CK ------------------------------------------------------------------------------------------------------------------ Invalid input(s):  POCBNP   Time Spent in minutes  45 Time spent with patient included reviewing progress notes, labs, imaging studies, and discussing plan for follow up.   No follow-ups on file.  This patient was seen by Jonetta Osgood, FNP-C in collaboration with Dr. [name] as a part of collaborative care agreement.   Jonetta Osgood MSN, FNP-C on 02/15/2021 at 1:55 PM   **Disclaimer: This note may have been dictated with voice recognition software. Similar sounding words can inadvertently be transcribed and this note  may contain transcription errors which may not have been corrected upon publication of note.**

## 2021-02-16 ENCOUNTER — Ambulatory Visit: Payer: Managed Care, Other (non HMO) | Admitting: Family

## 2021-02-19 ENCOUNTER — Encounter: Payer: Self-pay | Admitting: Nurse Practitioner

## 2021-02-19 ENCOUNTER — Ambulatory Visit: Payer: Managed Care, Other (non HMO) | Admitting: Cardiovascular Disease

## 2021-02-19 ENCOUNTER — Other Ambulatory Visit: Payer: Self-pay

## 2021-02-19 ENCOUNTER — Encounter: Payer: Self-pay | Admitting: Cardiovascular Disease

## 2021-02-19 VITALS — BP 144/72 | HR 76 | Ht 74.0 in | Wt 218.4 lb

## 2021-02-19 DIAGNOSIS — I5022 Chronic systolic (congestive) heart failure: Secondary | ICD-10-CM

## 2021-02-19 DIAGNOSIS — E7849 Other hyperlipidemia: Secondary | ICD-10-CM | POA: Diagnosis not present

## 2021-02-19 DIAGNOSIS — I10 Essential (primary) hypertension: Secondary | ICD-10-CM

## 2021-02-19 DIAGNOSIS — I25118 Atherosclerotic heart disease of native coronary artery with other forms of angina pectoris: Secondary | ICD-10-CM

## 2021-02-19 DIAGNOSIS — I700 Atherosclerosis of aorta: Secondary | ICD-10-CM

## 2021-02-19 MED ORDER — POTASSIUM CHLORIDE CRYS ER 20 MEQ PO TBCR: EXTENDED_RELEASE_TABLET | ORAL | 3 refills | Status: DC

## 2021-02-19 MED ORDER — CARVEDILOL 12.5 MG PO TABS: 12.5000 mg | ORAL_TABLET | Freq: Two times a day (BID) | ORAL | 3 refills | Status: DC

## 2021-02-19 MED ORDER — ISOSORBIDE MONONITRATE ER 30 MG PO TB24: 30.0000 mg | ORAL_TABLET | Freq: Every day | ORAL | 3 refills | Status: DC

## 2021-02-19 MED ORDER — PRALUENT 150 MG/ML ~~LOC~~ SOAJ
1.0000 | SUBCUTANEOUS | 11 refills | Status: DC
Start: 2021-02-19 — End: 2021-11-09

## 2021-02-19 MED ORDER — AMLODIPINE BESYLATE 10 MG PO TABS: 10.0000 mg | ORAL_TABLET | Freq: Every day | ORAL | 3 refills | Status: DC

## 2021-02-19 MED ORDER — EZETIMIBE 10 MG PO TABS: 10.0000 mg | ORAL_TABLET | Freq: Every day | ORAL | 3 refills | Status: DC

## 2021-02-19 MED ORDER — TORSEMIDE 20 MG PO TABS: ORAL_TABLET | ORAL | 3 refills | Status: DC

## 2021-02-19 NOTE — Progress Notes (Signed)
Cardiology Office Note  Date:  02/19/2021   ID:  [name] [name], DOB 11-01-1960, MRN [id number]  PCP:  [name], MD   Chief Complaint  Patient presents with   [facility] follow up     Patient c/o shortness of breath with little activity & LE edema. Medications reviewed by the patient verbally.     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), HTN,  HLD,  Stage III chronic kidney disease  GERD Former smoker quit 2013 Sleep apnea, unable to tolerate mask Statin intolerance Chronic renal insuff Who presents for routine follow-up of his coronary artery disease, history of bypass  LOV 09/2020  In the hospital last month hypoxia, cough, weakness, shortness of breath, COVID-positive, elevated troponin  Echocardiogram with ejection fraction 50 to 55%, septal wall hypokinesis consistent with postoperative state  Suggested to hold metolazone CR 2.77, potassium 3.0  Chronic right leg edema, much less on the left Right leg was vein donor leg Reports having little bit of swelling in his hands since he has been off the torsemide Reports little bit in the left leg Thinks he needs to get back on some torsemide  Reports follow-up with endocrine in [place] in December 2022 Followed by Dr. Candiss Norse, nephrology  A1c running around 13 Total cholesterol around 200, reports he is on Praluent, Zetia  EKG personally reviewed by myself on todays visit Normal sinus rhythm with rate 76 bpm consider old inferior MI, nonspecific ST abnormality  Echo 04/2020 EF 30 to 35%  08/29/2020 in the ER n/vomiting CR 2.5, BUN 97 WBC 18, UTI Given fluids, zosyn, keflex  In the ER 08/31/2020 H/s dizzy, leg pain BP elevated Fever 101.5 CR 2.8 Cefepime, IVF ecoli UTI Told to take torsemide PRN Fluid came on quick at home  Medication intolerances On farxiga/jardiance gets  "pancreatitis" Stopped hydralazine on his own, "intolerance"  Echocardiogram 08/2019  normal LV function, no evidence of valve disease or elevated right heart pressures  On Repatha 75 and Zetia  EKG personally reviewed by myself on todays visit Shows normal sinus rhythm rate 69 bpm T wave abnormality V6 1 and aVL  Outside notes from [place] [facility]  initial ejection fraction 35% presumably in 2018 Ejection fraction 50% in October 2019 Notes indicating ejection fraction 40 to 45% October 2020 [place] [facility] 2019 showing small to moderate inferolateral infarction without ischemia  Notes indicate previously unable to increase Entresto secondary to high potassium level  CABG details from [place] LIMA to the LAD Vein graft to the RCA Vein graft OM1 Vein graft to D1  Statin myalgias, rheumatoid issue   PMH:   has a past medical history of Arthritis, CAD (coronary artery disease), CKD (chronic kidney disease), stage IV ([place]), Colon polyps, Diabetes ([name]), Dupre's syndrome, GERD (gastroesophageal reflux disease), HFrEF (heart failure reduced ejection fraction) ([place]), Hyperlipidemia, Hypertension, Ischemic cardiomyopathy, Lymphedema, Migraine, Neuropathy, PAD (peripheral artery disease) ([place]), Retinopathy, Sleep apnea, and Stomach ulcer.  PSH:    Past Surgical History:  Procedure Laterality Date   bone graft surgery     CARDIAC CATHETERIZATION  2013   S/p PCI    CARDIAC CATHETERIZATION  2018   S/p CABG   CARDIAC SURGERY     cataract surgery Bilateral    CORONARY ARTERY BYPASS GRAFT  2018   (LIMA-LAD,VG-RCA,VG-OM1,VG-D1)   ESOPHAGOGASTRODUODENOSCOPY (EGD) WITH PROPOFOL N/A 04/04/2020   Procedure: ESOPHAGOGASTRODUODENOSCOPY (EGD) WITH  PROPOFOL;  Surgeon: Lucilla Lame, MD;  Location: [facility] ENDOSCOPY;  Service: Endoscopy;  Laterality: N/A;   ESOPHAGOGASTRODUODENOSCOPY (EGD) WITH PROPOFOL N/A 06/06/2020   Procedure: ESOPHAGOGASTRODUODENOSCOPY (EGD) WITH PROPOFOL;  Surgeon: Lucilla Lame, MD;  Location: [facility] ENDOSCOPY;  Service: Endoscopy;  Laterality: N/A;   EYE SURGERY     PERIPHERAL VASCULAR CATHETERIZATION Right 10/28/2016   PTA/DEB Right SFA   WRIST SURGERY      Current Outpatient Medications  Medication Sig Dispense Refill   Alirocumab (PRALUENT) 150 MG/ML SOAJ Inject 150 mg into the skin every 14 (fourteen) days. 2 mL 48   amLODipine (NORVASC) 10 MG tablet Take 1 tablet (10 mg total) by mouth daily. 30 tablet 0   aspirin EC 81 MG tablet Take 81 mg by mouth daily. Swallow whole.     benzonatate (TESSALON) 100 MG capsule Take 1 capsule (100 mg total) by mouth 3 (three) times daily. 20 capsule 0   carvedilol (COREG) 12.5 MG tablet Take 1 tablet (12.5 mg total) by mouth 2 (two) times daily with a meal. 180 tablet 2   chlorpheniramine-HYDROcodone (TUSSIONEX) 10-8 MG/5ML SUER Take 5 mLs by mouth every 12 (twelve) hours as needed for cough. 140 mL 0   cholecalciferol (VITAMIN D3) 25 MCG (1000 UNIT) tablet Take 2,000 Units by mouth daily.     ezetimibe (ZETIA) 10 MG tablet Take 1 tablet (10 mg total) by mouth daily. 90 tablet 1   insulin degludec (TRESIBA FLEXTOUCH) 200 UNIT/ML FlexTouch Pen Inject 140 Units into the skin daily. 21 mL 5   insulin lispro (HUMALOG) 100 UNIT/ML KwikPen 20-40 units per sliding scale 15 mL 3   Insulin Pen Needle (B-D ULTRAFINE III SHORT PEN) 31G X 8 MM MISC To use with pen basal and mealtime coverage insulin pens 5 times daily. DX: E11.65 500 each 1   isosorbide mononitrate (IMDUR) 30 MG 24 hr tablet Take 1 tablet (30 mg total) by mouth daily. 90 tablet 3   metoCLOPramide (REGLAN) 5 MG tablet One tab before each meal and one at bed time for dyspepsia prn 120 tablet 2   nortriptyline (PAMELOR) 10 MG capsule Take 30 mg by mouth at bedtime.     pantoprazole (PROTONIX) 40 MG tablet Take 1 tablet (40 mg total) by mouth 2 (two) times daily before a meal. 180 tablet 1   acetaminophen-codeine (TYLENOL #3) 300-30 MG tablet TAKE ONE TAB PO TID FOR  COUGH (Patient not taking: No sig reported) 21 tablet 0   ascorbic acid (VITAMIN C) 500 MG tablet Take 1 tablet (500 mg total) by mouth daily. (Patient not taking: No sig reported) 30 tablet 0   augmented betamethasone dipropionate (DIPROLENE) 0.05 % ointment Apply topically 2 (two) times daily. (Patient not taking: No sig reported) 30 g 0   furosemide (LASIX) 20 MG tablet Take 20 mg by mouth 2 (two) times daily. (Patient not taking: Reported on 02/19/2021)     metolazone (ZAROXOLYN) 5 MG tablet Take 5 mg by mouth. Take twice a week when needed with lasix.  Told from hospital to not take (Patient not taking: Reported on 02/19/2021)     potassium chloride SA (KLOR-CON) 20 MEQ tablet Take 1 tablet (20 mEq total) by mouth daily. (Patient not taking: Reported on 02/19/2021) 30 tablet 0   predniSONE (DELTASONE) 10 MG tablet 4 tabs po day1; 3 tabs po day 2; 2 tabs po day3; 1 tab po day4; 1/2 tab po day5,6 (Patient not taking: Reported on 02/19/2021) 11 tablet 0  No current facility-administered medications for this visit.     Allergies:   Iodinated diagnostic agents, Other, Statins, Pregabalin, Sacubitril-valsartan, Atorvastatin, Pravastatin, and Rosuvastatin   Social History:  The patient  reports that he quit smoking about 8 years ago. His smoking use included cigarettes. He has a 78.00 pack-year smoking history. He has never used smokeless tobacco. He reports current alcohol use. He reports that he does not use drugs.   Family History:   family history includes Diabetes in his mother; Heart disease in his father.    Review of Systems: Review of Systems  Constitutional: Negative.   HENT: Negative.    Respiratory: Negative.    Cardiovascular: Negative.   Gastrointestinal: Negative.   Musculoskeletal: Negative.        Leg weakness, sores on legs  Neurological: Negative.   Psychiatric/Behavioral: Negative.    All other systems reviewed and are negative.  PHYSICAL EXAM: VS:  BP (!) 144/72 (BP  Location: Left Arm, Patient Position: Sitting, Cuff Size: Normal)   Pulse 76   Ht '6\' 2"'$  (1.88 m)   Wt 218 lb 6 oz (99.1 kg)   SpO2 96%   BMI 28.04 kg/m  , BMI Body mass index is 28.04 kg/m. Constitutional:  oriented to person, place, and time. No distress.  HENT:  Head: Grossly normal Eyes:  no discharge. No scleral icterus.  Neck: No JVD, no carotid bruits  Cardiovascular: Regular rate and rhythm, no murmurs appreciated Pulmonary/Chest: Clear to auscultation bilaterally, no wheezes or rails Abdominal: Soft.  no distension.  no tenderness.  Musculoskeletal: Normal range of motion Neurological:  normal muscle tone. Coordination normal. No atrophy Skin: Skin warm and dry Psychiatric: normal affect, pleasant   Recent Labs: 12/07/2020: TSH 2.060 02/09/2021: B Natriuretic Peptide 470.8; Magnesium 2.1 02/10/2021: ALT 25 02/11/2021: BUN 74; Creatinine, Ser 2.77; Hemoglobin 12.6; Platelets 365; Potassium 3.0; Sodium 134    Lipid Panel Lab Results  Component Value Date   CHOL 201 (H) 12/07/2020   HDL 37 (L) 12/07/2020   LDLCALC 100 (H) 12/07/2020   TRIG 381 (H) 12/07/2020    Wt Readings from Last 3 Encounters:  02/19/21 218 lb 6 oz (99.1 kg)  02/15/21 205 lb (93 kg)  02/10/21 205 lb 0.4 oz (93 kg)     ASSESSMENT AND PLAN:  Problem List Items Addressed This Visit       Cardiology Problems   Chronic systolic CHF (congestive heart failure) ([place]) - Primary   Essential hypertension   Other hyperlipidemia   Coronary atherosclerosis  CAD with stable angina Ejection fraction 50% Currently with no symptoms of angina. No further workup at this time. Continue current medication regimen.   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 Candiss Norse Also with neuropathy  Leg weakness long history of poorly controlled diabetes PAD, right  iliac disease on study December 2018 with outside cardiology  Leg swelling Torsemide 20 daily up to twice daily sparingly for left leg swelling, hand swelling, Compression hose for right leg, vein donor site  Chronic kidney disease Secondary to long history of poorly controlled diabetes Followed by Dr. Angelena Form sparingly  Hyperlipidemia Continue Zetia Praluent Praluent up to 150 every 2 weeks  Recent hospitalization reviewed, complicated multiorgan system disease reviewed  Total encounter time more than 35 minutes  Greater than 50% was spent in counseling and coordination of care with the patient  Signed, Esmond Plants, M.D., Ph.D. [place], [place]

## 2021-02-19 NOTE — Patient Instructions (Addendum)
Medication Instructions:   Please START Torsemide 20 daily sparingly up to 20 mg twice a day Potassium when taking torsemide  AVOID metolazone  If you need a refill on your cardiac medications before your next appointment, please call your pharmacy.    Lab work: No new labs needed  Testing/Procedures: No new testing needed  Follow-Up: At [facility], you and your health needs are our priority.  As part of our continuing mission to provide you with exceptional heart care, we have created designated Provider Care Teams.  These Care Teams include your primary Cardiologist (physician) and Advanced Practice Providers (APPs -  Physician Assistants and Nurse Practitioners) who all work together to provide you with the care you need, when you need it.  You will need a follow up appointment in 6 months  Providers on your designated Care Team:   Murray Hodgkins, NP Christell Faith, PA-C Marrianne Mood, PA-C Cadence [place], [place]  COVID-19 Vaccine Information can be found at: ShippingScam.co.uk For questions related to vaccine distribution or appointments, please email [email or web address] or call [phone].

## 2021-02-21 ENCOUNTER — Other Ambulatory Visit: Payer: Self-pay

## 2021-02-21 ENCOUNTER — Encounter: Payer: Self-pay | Admitting: Family

## 2021-02-21 ENCOUNTER — Ambulatory Visit: Payer: Managed Care, Other (non HMO) | Attending: Family | Admitting: Family

## 2021-02-21 VITALS — BP 142/73 | HR 68 | Resp 20 | Ht 72.0 in | Wt 216.2 lb

## 2021-02-21 DIAGNOSIS — Z7982 Long term (current) use of aspirin: Secondary | ICD-10-CM | POA: Insufficient documentation

## 2021-02-21 DIAGNOSIS — Z87891 Personal history of nicotine dependence: Secondary | ICD-10-CM | POA: Diagnosis not present

## 2021-02-21 DIAGNOSIS — I5022 Chronic systolic (congestive) heart failure: Secondary | ICD-10-CM

## 2021-02-21 DIAGNOSIS — I2510 Atherosclerotic heart disease of native coronary artery without angina pectoris: Secondary | ICD-10-CM | POA: Insufficient documentation

## 2021-02-21 DIAGNOSIS — Z79899 Other long term (current) drug therapy: Secondary | ICD-10-CM | POA: Diagnosis not present

## 2021-02-21 DIAGNOSIS — I890 Lymphedema, not elsewhere classified: Secondary | ICD-10-CM | POA: Diagnosis not present

## 2021-02-21 DIAGNOSIS — I130 Hypertensive heart and chronic kidney disease with heart failure and stage 1 through stage 4 chronic kidney disease, or unspecified chronic kidney disease: Secondary | ICD-10-CM | POA: Insufficient documentation

## 2021-02-21 DIAGNOSIS — Z794 Long term (current) use of insulin: Secondary | ICD-10-CM

## 2021-02-21 DIAGNOSIS — E1122 Type 2 diabetes mellitus with diabetic chronic kidney disease: Secondary | ICD-10-CM | POA: Diagnosis not present

## 2021-02-21 DIAGNOSIS — E785 Hyperlipidemia, unspecified: Secondary | ICD-10-CM | POA: Diagnosis not present

## 2021-02-21 DIAGNOSIS — I10 Essential (primary) hypertension: Secondary | ICD-10-CM | POA: Diagnosis not present

## 2021-02-21 DIAGNOSIS — E1151 Type 2 diabetes mellitus with diabetic peripheral angiopathy without gangrene: Secondary | ICD-10-CM | POA: Diagnosis not present

## 2021-02-21 DIAGNOSIS — N184 Chronic kidney disease, stage 4 (severe): Secondary | ICD-10-CM

## 2021-02-21 DIAGNOSIS — Z833 Family history of diabetes mellitus: Secondary | ICD-10-CM | POA: Insufficient documentation

## 2021-02-21 DIAGNOSIS — Z8616 Personal history of COVID-19: Secondary | ICD-10-CM | POA: Diagnosis not present

## 2021-02-21 DIAGNOSIS — I5042 Chronic combined systolic (congestive) and diastolic (congestive) heart failure: Secondary | ICD-10-CM | POA: Diagnosis present

## 2021-02-21 DIAGNOSIS — Z8249 Family history of ischemic heart disease and other diseases of the circulatory system: Secondary | ICD-10-CM | POA: Diagnosis not present

## 2021-02-21 DIAGNOSIS — G4730 Sleep apnea, unspecified: Secondary | ICD-10-CM | POA: Insufficient documentation

## 2021-02-21 DIAGNOSIS — Z888 Allergy status to other drugs, medicaments and biological substances status: Secondary | ICD-10-CM | POA: Diagnosis not present

## 2021-02-21 DIAGNOSIS — E1140 Type 2 diabetes mellitus with diabetic neuropathy, unspecified: Secondary | ICD-10-CM | POA: Diagnosis not present

## 2021-02-21 LAB — BASIC METABOLIC PANEL
Anion gap: 7 (ref 5–15)
BUN: 46 mg/dL — ABNORMAL HIGH (ref 8–23)
CO2: 24 mmol/L (ref 22–32)
Calcium: 8.4 mg/dL — ABNORMAL LOW (ref 8.9–10.3)
Chloride: 105 mmol/L (ref 98–111)
Creatinine, Ser: 2.34 mg/dL — ABNORMAL HIGH (ref 0.61–1.24)
GFR, Estimated: 31 mL/min — ABNORMAL LOW (ref >60)
Glucose, Bld: 206 mg/dL — ABNORMAL HIGH (ref 70–99)
Potassium: 5 mmol/L (ref 3.5–5.1)
Sodium: 136 mmol/L (ref 135–145)

## 2021-02-21 NOTE — Patient Instructions (Addendum)
Begin weighing daily and call for an overnight weight gain of > 2 pounds or a weekly weight gain of >5 pounds. 
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
## 2021-02-22 ENCOUNTER — Telehealth: Payer: Self-pay

## 2021-02-22 NOTE — Telephone Encounter (Signed)
Patient called to inform him of lab work results, including kidney function. Informed that potassium is now on the high end of normal, and to reduce high potassium foods and begin taking half a tablet a day of potassium supplement until the next time labs are rechecked, per Darylene Price, NP.  Pt verbalized understanding.  Pt had no questions. Pt told to call if any questions or concerns arise.  Georg Ruddle, RN [facility]

## 2021-02-22 NOTE — Telephone Encounter (Signed)
-----   Message from Alisa Graff, [place] sent at 02/22/2021  8:28 AM EDT ----- Potassium is now at the high end of normal so avoid eating high potassium foods as he's taking potassium supplements. Kidney function is a little better from discharge. GFR 31, creatinine 2.34.

## 2021-03-13 ENCOUNTER — Other Ambulatory Visit: Payer: Self-pay

## 2021-03-13 ENCOUNTER — Ambulatory Visit (INDEPENDENT_AMBULATORY_CARE_PROVIDER_SITE_OTHER): Payer: Managed Care, Other (non HMO) | Admitting: Cardiovascular Disease

## 2021-03-13 ENCOUNTER — Encounter: Payer: Self-pay | Admitting: Cardiovascular Disease

## 2021-03-13 ENCOUNTER — Telehealth: Payer: Self-pay | Admitting: Cardiovascular Disease

## 2021-03-13 VITALS — BP 140/64 | HR 78 | Ht 74.0 in | Wt 224.2 lb

## 2021-03-13 DIAGNOSIS — I2510 Atherosclerotic heart disease of native coronary artery without angina pectoris: Secondary | ICD-10-CM | POA: Diagnosis not present

## 2021-03-13 DIAGNOSIS — I5022 Chronic systolic (congestive) heart failure: Secondary | ICD-10-CM | POA: Diagnosis not present

## 2021-03-13 DIAGNOSIS — M7989 Other specified soft tissue disorders: Secondary | ICD-10-CM

## 2021-03-13 DIAGNOSIS — N184 Chronic kidney disease, stage 4 (severe): Secondary | ICD-10-CM

## 2021-03-13 DIAGNOSIS — E1122 Type 2 diabetes mellitus with diabetic chronic kidney disease: Secondary | ICD-10-CM | POA: Diagnosis not present

## 2021-03-13 DIAGNOSIS — I25118 Atherosclerotic heart disease of native coronary artery with other forms of angina pectoris: Secondary | ICD-10-CM

## 2021-03-13 DIAGNOSIS — I252 Old myocardial infarction: Secondary | ICD-10-CM

## 2021-03-13 MED ORDER — TORSEMIDE 20 MG PO TABS: ORAL_TABLET | ORAL | 3 refills | Status: DC

## 2021-03-13 MED ORDER — ISOSORBIDE MONONITRATE ER 30 MG PO TB24: 30.0000 mg | ORAL_TABLET | Freq: Two times a day (BID) | ORAL | 3 refills | Status: DC

## 2021-03-13 MED ORDER — CARVEDILOL 12.5 MG PO TABS: ORAL_TABLET | ORAL | 3 refills | Status: DC

## 2021-03-13 NOTE — Telephone Encounter (Signed)
Pt c/o Shortness Of Breath: STAT if SOB developed within the last 24 hours or pt is noticeably SOB on the phone  1. Are you currently SOB (can you hear that pt is SOB on the phone)? Yes but not audible   2. How long have you been experiencing SOB? Since last week   3. Are you SOB when sitting or when up moving around? All the time but now unable to lay flat and even being on an angle causes increase   4. Are you currently experiencing any other symptoms?   RLE  significant swelling Same leg used for CABG patient reports measured at calf 44 cm   Reports dc from hospital several weeks ago for covid they stopped some medications ( torsemide and ??spironolactone?)

## 2021-03-13 NOTE — Patient Instructions (Addendum)
Medication Instructions:  Please STOP amlodipine Please INCREASE isosorbide 30 mg twice a day Coreg 18.5 mg twice a day  1 1/2 pills twice a day  if pressure runs high Torsemide 40 mg daily  with extra 40 in the PM  for leg swelling  If you need a refill on your cardiac medications before your next appointment, please call your pharmacy.    Lab work: No new labs needed  Testing/Procedures: No new testing needed  Follow-Up: At [facility], you and your health needs are our priority.  As part of our continuing mission to provide you with exceptional heart care, we have created designated Provider Care Teams.  These Care Teams include your primary Cardiologist (physician) and Advanced Practice Providers (APPs -  Physician Assistants and Nurse Practitioners) who all work together to provide you with the care you need, when you need it.  You will need a follow up appointment in 1- 2 months  Providers on your designated Care Team:   Murray Hodgkins, NP Christell Faith, PA-C Cadence Kathlen Mody, [place]  COVID-19 Vaccine Information can be found at: [email or web address] For questions related to vaccine distribution or appointments, please email [email or web address] or call 6155025607.

## 2021-03-13 NOTE — Progress Notes (Signed)
Cardiology Office Note  Date:  03/14/2021   ID:  [name] [name], DOB 1960-12-20, MRN [id number]  PCP:  [name], MD   Chief Complaint  Patient presents with   Shortness of Breath    Patient c/o shortness of breath & LE edema. Medications reviewed by the patient verbally.     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), HTN,  HLD,  GERD Former smoker quit 2013 Sleep apnea, unable to tolerate mask Statin intolerance Chronic renal insuff secondary to poorly controlled diabetes Who presents for routine follow-up of his coronary artery disease, history of bypass  LOV 01/2021 hospital 12/2020 hypoxia, cough, weakness, shortness of breath, COVID-positive, elevated troponin  Echocardiogram with ejection fraction 50 to 55%, septal wall hypokinesis consistent with postoperative state  Suggested to hold metolazone CR 2.77, potassium 3.0  Since then with worsening leg swelling off torsemide We recommend he restart torsemide 20 mg on last clinic visit Now weight is even higher, leg swelling on the right painful, abdomen swollen, hand swelling, very short of breath, unable to lay supine "Gasping at night to breath" Also reports amlodipine increased from 5-10 on hospital discharge, legs more swollen Blood pressure running higher  Followed by Dr. Candiss Norse, nephrology  A1c running around 13 Total cholesterol around 200, reports he is on Praluent, Zetia  EKG personally reviewed by myself on todays visit Normal sinus rhythm with rate 76 bpm consider old inferior MI, nonspecific ST abnormality  Echo 04/2020 EF 30 to 35%  Medication intolerances On farxiga/jardiance gets "pancreatitis" Stopped hydralazine on his own, "intolerance"  Echocardiogram 08/2019  normal LV function, no evidence of valve disease or elevated right heart pressures  Outside notes from [place]  heart  initial ejection fraction 35% presumably in 2018 Ejection fraction 50% in October 2019 Notes indicating ejection fraction 40 to 45% October 2020 Sun Myoview 2019 showing small to moderate inferolateral infarction without ischemia  Notes indicate previously unable to increase Entresto secondary to high potassium level  CABG details from [place] LIMA to the LAD Vein graft to the RCA Vein graft OM1 Vein graft to D1  Statin myalgias, rheumatoid issue  PMH:   has a past medical history of Arthritis, CAD (coronary artery disease), CKD (chronic kidney disease), stage IV ([place]), Colon polyps, Diabetes ([place]), Dupre's syndrome, GERD (gastroesophageal reflux disease), HFrEF (heart failure reduced ejection fraction) ([place]), Hyperlipidemia, Hypertension, Ischemic cardiomyopathy, Lymphedema, Migraine, Neuropathy, PAD (peripheral artery disease) ([place]), Retinopathy, Sleep apnea, and Stomach ulcer.  PSH:    Past Surgical History:  Procedure Laterality Date   bone graft surgery     CARDIAC CATHETERIZATION  2013   S/p PCI    CARDIAC CATHETERIZATION  2018   S/p CABG   CARDIAC SURGERY     cataract surgery Bilateral    CORONARY ARTERY BYPASS GRAFT  2018   (LIMA-LAD,VG-RCA,VG-OM1,VG-D1)   ESOPHAGOGASTRODUODENOSCOPY (EGD) WITH PROPOFOL N/A 04/04/2020   Procedure: ESOPHAGOGASTRODUODENOSCOPY (EGD) WITH PROPOFOL;  Surgeon: Lucilla Lame, MD;  Location: [facility] ENDOSCOPY;  Service: Endoscopy;  Laterality: N/A;   ESOPHAGOGASTRODUODENOSCOPY (EGD) WITH PROPOFOL N/A 06/06/2020   Procedure: ESOPHAGOGASTRODUODENOSCOPY (EGD) WITH PROPOFOL;  Surgeon: Lucilla Lame, MD;  Location: [facility] ENDOSCOPY;  Service: Endoscopy;  Laterality: N/A;   EYE SURGERY     PERIPHERAL VASCULAR CATHETERIZATION Right 10/28/2016   PTA/DEB Right SFA   WRIST SURGERY      Current Outpatient Medications  Medication Sig  Dispense Refill   albuterol (VENTOLIN HFA) 108 (90 Base) MCG/ACT inhaler SMARTSIG:1 Inhalation By Mouth Every  4-6 Hours PRN     Alirocumab (PRALUENT) 150 MG/ML SOAJ Inject 1 pen into the skin every 14 (fourteen) days. 2 mL 11   aspirin EC 81 MG tablet Take 81 mg by mouth daily. Swallow whole.     cholecalciferol (VITAMIN D3) 25 MCG (1000 UNIT) tablet Take 2,000 Units by mouth daily.     ezetimibe (ZETIA) 10 MG tablet Take 1 tablet (10 mg total) by mouth daily. 90 tablet 3   insulin degludec (TRESIBA FLEXTOUCH) 200 UNIT/ML FlexTouch Pen Inject 140 Units into the skin daily. 21 mL 5   insulin lispro (HUMALOG) 100 UNIT/ML KwikPen 20-40 units per sliding scale 15 mL 3   Insulin Pen Needle (B-D ULTRAFINE III SHORT PEN) 31G X 8 MM MISC To use with pen basal and mealtime coverage insulin pens 5 times daily. DX: E11.65 500 each 1   metoCLOPramide (REGLAN) 5 MG tablet One tab before each meal and one at bed time for dyspepsia prn 120 tablet 2   nortriptyline (PAMELOR) 10 MG capsule Take 30 mg by mouth at bedtime.     pantoprazole (PROTONIX) 40 MG tablet Take 1 tablet (40 mg total) by mouth 2 (two) times daily before a meal. 180 tablet 1   augmented betamethasone dipropionate (DIPROLENE) 0.05 % ointment Apply topically 2 (two) times daily. (Patient not taking: No sig reported) 30 g 0   benzonatate (TESSALON) 100 MG capsule Take 1 capsule (100 mg total) by mouth 3 (three) times daily. (Patient not taking: Reported on 03/13/2021) 20 capsule 0   carvedilol (COREG) 12.5 MG tablet TAKE 18.5 MG  (1 1/2 PILL) TWICE A DAY 225 tablet 3   chlorpheniramine-HYDROcodone (TUSSIONEX) 10-8 MG/5ML SUER Take 5 mLs by mouth every 12 (twelve) hours as needed for cough. (Patient not taking: Reported on 03/13/2021) 140 mL 0   isosorbide mononitrate (IMDUR) 30 MG 24 hr tablet Take 1 tablet (30 mg total) by mouth 2 (two) times daily. 180 tablet 3   potassium chloride SA (KLOR-CON) 20 MEQ tablet Take 1 tab daily with torsemide (Patient not taking: Reported on 03/13/2021) 90 tablet 3   torsemide (DEMADEX) 20 MG tablet Take 40 mg daily (two  tabs), may take additional 40 mg (two tabs) in the evening for swelling to lower legs 200 tablet 3   No current facility-administered medications for this visit.     Allergies:   Iodinated diagnostic agents, Other, Statins, Pregabalin, Sacubitril-valsartan, Atorvastatin, Pravastatin, and Rosuvastatin   Social History:  The patient  reports that he quit smoking about 8 years ago. His smoking use included cigarettes. He has a 78.00 pack-year smoking history. He has never used smokeless tobacco. He reports current alcohol use. He reports that he does not use drugs.   Family History:   family history includes Diabetes in his mother; Heart disease in his father.    Review of Systems: Review of Systems  Constitutional: Negative.        Weight gain  HENT: Negative.    Respiratory:  Positive for shortness of breath.   Cardiovascular:  Positive for leg swelling.  Gastrointestinal: Negative.   Musculoskeletal: Negative.        Leg weakness, sores on legs  Neurological: Negative.   Psychiatric/Behavioral: Negative.    All other systems reviewed and are negative.  PHYSICAL EXAM: VS:  BP 140/64 (BP Location: Left Arm, Patient Position: Sitting, Cuff Size: Normal)  Pulse 78   Ht '6\' 2"'$  (1.88 m)   Wt 224 lb 4 oz (101.7 kg)   SpO2 95%   BMI 28.79 kg/m  , BMI Body mass index is 28.79 kg/m. Constitutional:  oriented to person, place, and time. No distress.  HENT:  Head: Grossly normal Eyes:  no discharge. No scleral icterus.  Neck: No JVD, no carotid bruits  Cardiovascular: Regular rate and rhythm, no murmurs appreciated 2+ pitting edema right lower extremity, trace on the left Pulmonary/Chest: Clear to auscultation bilaterally, no wheezes or rails Abdominal: Soft.  no distension.  no tenderness.  Musculoskeletal: Normal range of motion Neurological:  normal muscle tone. Coordination normal. No atrophy Skin: Skin warm and dry Psychiatric: normal affect, pleasant  Recent  Labs: 12/07/2020: TSH 2.060 02/09/2021: B Natriuretic Peptide 470.8; Magnesium 2.1 02/10/2021: ALT 25 02/11/2021: Hemoglobin 12.6; Platelets 365 02/21/2021: BUN 46; Creatinine, Ser 2.34; Potassium 5.0; Sodium 136    Lipid Panel Lab Results  Component Value Date   CHOL 201 (H) 12/07/2020   HDL 37 (L) 12/07/2020   LDLCALC 100 (H) 12/07/2020   TRIG 381 (H) 12/07/2020    Wt Readings from Last 3 Encounters:  03/13/21 224 lb 4 oz (101.7 kg)  02/21/21 216 lb 4 oz (98.1 kg)  02/19/21 218 lb 6 oz (99.1 kg)     ASSESSMENT AND PLAN:  Problem List Items Addressed This Visit       Cardiology Problems   Coronary artery disease with history of myocardial infarction without history of CABG (Chronic)   Relevant Medications   isosorbide mononitrate (IMDUR) 30 MG 24 hr tablet   carvedilol (COREG) 12.5 MG tablet   Chronic systolic CHF (congestive heart failure) (HCC) - Primary   Relevant Medications   isosorbide mononitrate (IMDUR) 30 MG 24 hr tablet   carvedilol (COREG) 12.5 MG tablet   Other Relevant Orders   EKG 12-Lead     Other   Diabetes mellitus with stage 4 chronic kidney disease GFR 15-29 (HCC) (Chronic)   Other Visit Diagnoses     Leg swelling       Relevant Orders   EKG 12-Lead   Coronary artery disease of native artery of native heart with stable angina pectoris (HCC)       Relevant Medications   isosorbide mononitrate (IMDUR) 30 MG 24 hr tablet   carvedilol (COREG) 12.5 MG tablet     CAD with stable angina Ejection fraction 50% High risk of disease progression, cholesterol above goal, diabetes numbers poorly controlled Denies angina  Acute on chronic diastolic CHF In the setting of renal failure Worsening CHF symptoms Not responding to torsemide 20 mg daily, weight higher, symptoms getting worse Likely exacerbated by underlying renal failure Reports gasping for breath, has PND orthopnea -Recommend he take torsemide 40 twice daily for 3 days then down to 40  daily, Eventually down to 20 mg once euvolemic He does have metolazone which she can take if he is desperate --Recommend we stop the amlodipine Management of blood pressure as detailed below -Also recommended compression hose on the right  Essential hypertension Stop amlodipine given leg swelling Increase isosorbide up to 30 twice daily Could also increase carvedilol up to 18.5 mg twice daily Blood pressure may also improve with diuresis  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 Candiss Norse Also with neuropathy  PAD long history of poorly controlled diabetes PAD, right  iliac disease on study December 2018 with outside cardiology High risk of complications  Chronic kidney disease Secondary to long history of poorly controlled diabetes Followed by Dr. Candiss Norse Given heart failure symptoms as detailed above, higher dose torsemide indicated  Hyperlipidemia Continue Zetia Praluent Praluent 150 every 2 weeks Numbers exacerbated by poorly controlled diabetes   Total encounter time more than 25 minutes  Greater than 50% was spent in counseling and coordination of care with the patient    Signed, Esmond Plants, M.D., Ph.D. [place], [place]

## 2021-03-13 NOTE — Telephone Encounter (Signed)
Pt scheduled to see DOD- Dr. Rockey Situ today 03/13/21 at 3:20 PM.

## 2021-03-13 NOTE — Telephone Encounter (Signed)
Advised Meriam Sprague to schedule this patient in the DOD slot with Dr. Rockey Situ today. Will route to his nurse as an Pharmacist, hospital.

## 2021-03-14 ENCOUNTER — Telehealth: Payer: Self-pay | Admitting: Cardiovascular Disease

## 2021-03-14 ENCOUNTER — Other Ambulatory Visit: Payer: Self-pay

## 2021-03-14 MED ORDER — TORSEMIDE 20 MG PO TABS: ORAL_TABLET | ORAL | 3 refills | Status: DC

## 2021-03-14 NOTE — Telephone Encounter (Signed)
Pharmacy calling for clarity on torsemide rx sent yesterday   Please fax new order that clear as phone lines are busy and fax will be quicker.

## 2021-03-14 NOTE — Telephone Encounter (Signed)
Script fax over to [facility]  torsemide (DEMADEX) 20 MG tablet  3 ordered        Summary: Take 40 mg daily (two tabs), may take additional 40 mg (two tabs) in the evening for swelling to lower legs     *Torsemide 40 mg tab is NOT cover on pt's formularity, will need to order the 20 mg

## 2021-04-09 ENCOUNTER — Other Ambulatory Visit (INDEPENDENT_AMBULATORY_CARE_PROVIDER_SITE_OTHER): Payer: Self-pay | Admitting: Vascular Surgery

## 2021-04-09 DIAGNOSIS — I739 Peripheral vascular disease, unspecified: Secondary | ICD-10-CM

## 2021-04-10 ENCOUNTER — Other Ambulatory Visit: Payer: Self-pay

## 2021-04-10 ENCOUNTER — Ambulatory Visit (INDEPENDENT_AMBULATORY_CARE_PROVIDER_SITE_OTHER): Payer: Managed Care, Other (non HMO)

## 2021-04-10 ENCOUNTER — Ambulatory Visit (INDEPENDENT_AMBULATORY_CARE_PROVIDER_SITE_OTHER): Payer: Managed Care, Other (non HMO) | Admitting: Vascular Surgery

## 2021-04-10 VITALS — BP 167/77 | HR 79 | Ht 74.0 in | Wt 224.0 lb

## 2021-04-10 DIAGNOSIS — I739 Peripheral vascular disease, unspecified: Secondary | ICD-10-CM

## 2021-04-10 DIAGNOSIS — I890 Lymphedema, not elsewhere classified: Secondary | ICD-10-CM

## 2021-04-10 DIAGNOSIS — Z794 Long term (current) use of insulin: Secondary | ICD-10-CM

## 2021-04-10 DIAGNOSIS — I10 Essential (primary) hypertension: Secondary | ICD-10-CM | POA: Diagnosis not present

## 2021-04-10 DIAGNOSIS — M7989 Other specified soft tissue disorders: Secondary | ICD-10-CM | POA: Diagnosis not present

## 2021-04-10 DIAGNOSIS — E1140 Type 2 diabetes mellitus with diabetic neuropathy, unspecified: Secondary | ICD-10-CM

## 2021-04-10 NOTE — Assessment & Plan Note (Signed)
Even with his decreased mobility with his foot drop, his swelling is still under pretty good control.  He has been elevating and using compression well.

## 2021-04-10 NOTE — Progress Notes (Signed)
MRN : [id number]  [name] [name] is a 60 y.o. (29-Apr-1961) male who presents with chief complaint of  Chief Complaint  Patient presents with   Follow-up    1 yr [place]  .  History of Present Illness: Patient returns today in follow up of his PAD and lymphedema with leg swelling.  Overall he has developed some sort of foot drop in the left leg and is now using a motorized wheelchair.  He is elevating his legs well and his swelling is been under very good control.  No wounds or ulcerations that are poorly healing.  No rest pain. ABIs today remain in the normal range at 1.04 on the right and 0.93 on the left with digit pressures of over 100.  Current Outpatient Medications  Medication Sig Dispense Refill   albuterol (VENTOLIN HFA) 108 (90 Base) MCG/ACT inhaler SMARTSIG:1 Inhalation By Mouth Every 4-6 Hours PRN     Alirocumab (PRALUENT) 150 MG/ML SOAJ Inject 1 pen into the skin every 14 (fourteen) days. 2 mL 11   aspirin EC 81 MG tablet Take 81 mg by mouth daily. Swallow whole.     carvedilol (COREG) 12.5 MG tablet TAKE 18.5 MG  (1 1/2 PILL) TWICE A DAY 225 tablet 3   cholecalciferol (VITAMIN D3) 25 MCG (1000 UNIT) tablet Take 2,000 Units by mouth daily.     ezetimibe (ZETIA) 10 MG tablet Take 1 tablet (10 mg total) by mouth daily. 90 tablet 3   insulin degludec (TRESIBA FLEXTOUCH) 200 UNIT/ML FlexTouch Pen Inject 140 Units into the skin daily. 21 mL 5   insulin lispro (HUMALOG) 100 UNIT/ML KwikPen 20-40 units per sliding scale 15 mL 3   Insulin Pen Needle (B-D ULTRAFINE III SHORT PEN) 31G X 8 MM MISC To use with pen basal and mealtime coverage insulin pens 5 times daily. DX: E11.65 500 each 1   isosorbide mononitrate (IMDUR) 30 MG 24 hr tablet Take 1 tablet (30 mg total) by mouth 2 (two) times daily. 180 tablet 3   metoCLOPramide (REGLAN) 5 MG tablet One tab before each meal and one at bed time for dyspepsia prn 120 tablet 2   nortriptyline (PAMELOR) 10 MG capsule Take 30 mg by mouth at  bedtime.     torsemide (DEMADEX) 20 MG tablet Take 40 mg daily (two tabs), may take additional 40 mg (two tabs) in the evening for swelling to lower legs 200 tablet 3   augmented betamethasone dipropionate (DIPROLENE) 0.05 % ointment Apply topically 2 (two) times daily. (Patient not taking: No sig reported) 30 g 0   benzonatate (TESSALON) 100 MG capsule Take 1 capsule (100 mg total) by mouth 3 (three) times daily. (Patient not taking: No sig reported) 20 capsule 0   chlorpheniramine-HYDROcodone (TUSSIONEX) 10-8 MG/5ML SUER Take 5 mLs by mouth every 12 (twelve) hours as needed for cough. (Patient not taking: No sig reported) 140 mL 0   pantoprazole (PROTONIX) 40 MG tablet Take 1 tablet (40 mg total) by mouth 2 (two) times daily before a meal. 180 tablet 1   potassium chloride SA (KLOR-CON) 20 MEQ tablet Take 1 tab daily with torsemide (Patient not taking: No sig reported) 90 tablet 3   No current facility-administered medications for this visit.    Past Medical History:  Diagnosis Date   Arthritis    CAD (coronary artery disease)    a. 12/2016 s/p CABG x 4 (LIMA->LAD, VG->RCA, VG->OM1, VG->D1); b. 02/2018 MV: EF 35%, small-med inferolaterlal infarct. No ischemia.  CKD (chronic kidney disease), stage IV (HCC)    Colon polyps    Diabetes ([place])    Dupre's syndrome    GERD (gastroesophageal reflux disease)    HFrEF (heart failure reduced ejection fraction) ([place])    a. 2018 EF 35%; b. 02/2018 EF 50%; c. 02/2019 EF 40-45%; d. 08/2019 Echo: EF 50-55%, no rwma, GrII DD; e. 04/2020 Echo: EF 30-35%, mod-sev glob HK. Mild LVH. GrII DD. Low-nl RV fxn. Mildly dil LA. Mild MR. Mild-mod Ao sclerosis w/o stenosis.   Hyperlipidemia    Hypertension    Ischemic cardiomyopathy    a. 02/2018 MV: EF 35%; b. 08/2019 Echo: EF 50-55%; c. 04/2020 Echo: EF 30-35%.   Lymphedema    Migraine    Neuropathy    PAD (peripheral artery disease) ([place])    a. 04/2017 Aortoiliac duplex: R iliac dzs; b. 03/2020 LE Duplex: mild  bilat atherosclerosis throughout. Patent vessels.   Retinopathy    Sleep apnea    Stomach ulcer     Past Surgical History:  Procedure Laterality Date   bone graft surgery     CARDIAC CATHETERIZATION  2013   S/p PCI    CARDIAC CATHETERIZATION  2018   S/p CABG   CARDIAC SURGERY     cataract surgery Bilateral    CORONARY ARTERY BYPASS GRAFT  2018   (LIMA-LAD,VG-RCA,VG-OM1,VG-D1)   ESOPHAGOGASTRODUODENOSCOPY (EGD) WITH PROPOFOL N/A 04/04/2020   Procedure: ESOPHAGOGASTRODUODENOSCOPY (EGD) WITH PROPOFOL;  Surgeon: Lucilla Lame, MD;  Location: [facility] ENDOSCOPY;  Service: Endoscopy;  Laterality: N/A;   ESOPHAGOGASTRODUODENOSCOPY (EGD) WITH PROPOFOL N/A 06/06/2020   Procedure: ESOPHAGOGASTRODUODENOSCOPY (EGD) WITH PROPOFOL;  Surgeon: Lucilla Lame, MD;  Location: [facility] ENDOSCOPY;  Service: Endoscopy;  Laterality: N/A;   EYE SURGERY     PERIPHERAL VASCULAR CATHETERIZATION Right 10/28/2016   PTA/DEB Right SFA   WRIST SURGERY       Social History         Tobacco Use   Smoking status: Former Smoker      Types: Cigarettes      Quit date: 05/18/2012      Years since quitting: 7.8   Smokeless tobacco: Never Used  Vaping Use   Vaping Use: Never used  Substance Use Topics   Alcohol use: Yes      Comment: very rarely   Drug use: Never               Family History  Problem Relation Age of Onset   Diabetes Mother     Heart disease Father    no bleeding or clotting disorders        Allergies  Allergen Reactions   Other Other (See Comments)      Pain  With joint stiffness      Statins        Pain  With joint stiffness    Atorvastatin Other (See Comments)      Muscle aches Muscle aches   Entresto [Sacubitril-Valsartan]        Hyperkalemia     Pravastatin Other (See Comments)      Muscle aches Muscle aches   Pregabalin Other (See Comments)      Generalized aches and pains Generalized aches and pains   Rosuvastatin Other (See Comments)      Muscle Aches Other  reaction(s): JOINT PAIN Other reaction(s): JOINT PAIN Muscle Aches      REVIEW OF SYSTEMS (Negative unless checked)   Constitutional: [] Weight loss  [] Fever  [] Chills Cardiac: [] Chest pain   [] Chest  pressure   [x] Palpitations   [] Shortness of breath when laying flat   [] Shortness of breath at rest   [x] Shortness of breath with exertion. Vascular:  [] Pain in legs with walking   [] Pain in legs at rest   [] Pain in legs when laying flat   [] Claudication   [] Pain in feet when walking  [] Pain in feet at rest  [] Pain in feet when laying flat   [] History of DVT   [] Phlebitis   [x] Swelling in legs   [] Varicose veins   [] Non-healing ulcers Pulmonary:   [] Uses home oxygen   [] Productive cough   [] Hemoptysis   [] Wheeze  [] COPD   [] Asthma Neurologic:  [] Dizziness  [] Blackouts   [] Seizures   [] History of stroke   [] History of TIA  [] Aphasia   [] Temporary blindness   [] Dysphagia   [] Weakness or numbness in arms   [x] Weakness or numbness in legs Musculoskeletal:  [] Arthritis   [] Joint swelling   [] Joint pain   [] Low back pain Hematologic:  [] Easy bruising  [] Easy bleeding   [] Hypercoagulable state   [] Anemic  [] Hepatitis Gastrointestinal:  [] Blood in stool   [] Vomiting blood  [] Gastroesophageal reflux/heartburn   [] Abdominal pain Genitourinary:  [x] Chronic kidney disease   [] Difficult urination  [] Frequent urination  [] Burning with urination   [] Hematuria Skin:  [] Rashes   [] Ulcers   [] Wounds Psychological:  [] History of anxiety   []  History of major depression.     Physical Examination  BP (!) 167/77   Pulse 79   Ht 6\' 2"  (1.88 m)   Wt 224 lb (101.6 kg)   BMI 28.76 kg/m  Gen:  WD/WN, NAD Head: [place]/AT, No temporalis wasting. Ear/Nose/Throat: Hearing grossly intact, nares w/o erythema or drainage Eyes: Conjunctiva clear. Sclera non-icteric Neck: Supple.  Trachea midline Pulmonary:  Good air movement, no use of accessory muscles.  Cardiac: RRR, no JVD Vascular:  Vessel Right Left  Radial Palpable  Palpable                          PT Palpable Palpable  DP Palpable Palpable   Musculoskeletal: Has foot drop on the left.  In a motorized scooter now.  1+ right lower extremity edema with no significant left lower extremity edema. Neurologic: Sensation grossly intact in extremities.  Speech is fluent.  Psychiatric: Judgment intact, Mood & affect appropriate for pt's clinical situation. Dermatologic: No rashes or ulcers noted.  No cellulitis or open wounds.      Labs Recent Results (from the past 2160 hour(s))  Basic metabolic panel     Status: Abnormal   Collection Time: 02/08/21  2:55 PM  Result Value Ref Range   Sodium 127 (L) 135 - 145 mmol/L   Potassium 3.1 (L) 3.5 - 5.1 mmol/L   Chloride 84 (L) 98 - 111 mmol/L   CO2 24 22 - 32 mmol/L   Glucose, Bld 419 (H) 70 - 99 mg/dL    Comment: Glucose reference range applies only to samples taken after fasting for at least 8 hours.   BUN 59 (H) 8 - 23 mg/dL   Creatinine, Ser 3.98 (H) 0.61 - 1.24 mg/dL   Calcium 8.8 (L) 8.9 - 10.3 mg/dL   GFR, Estimated 16 (L) >60 mL/min    Comment: (NOTE) Calculated using the CKD-EPI Creatinine Equation (2021)    Anion gap 19 (H) 5 - 15    Comment: Performed at [facility], [address] [place], [place] [address]  CBC  Status: Abnormal   Collection Time: 02/08/21  2:55 PM  Result Value Ref Range   WBC 12.3 (H) 4.0 - 10.5 K/uL   RBC 4.39 4.22 - 5.81 MIL/uL   Hemoglobin 13.7 13.0 - 17.0 g/dL   HCT 37.7 (L) 39.0 - 52.0 %   MCV 85.9 80.0 - 100.0 fL   MCH 31.2 26.0 - 34.0 pg   MCHC 36.3 (H) 30.0 - 36.0 g/dL   RDW 13.5 11.5 - 15.5 %   Platelets 339 150 - 400 K/uL   nRBC 0.0 0.0 - 0.2 %    Comment: Performed at [facility], [address] [place], [place] [address]  Troponin I (High Sensitivity)     Status: Abnormal   Collection Time: 02/08/21  2:55 PM  Result Value Ref Range   Troponin I (High Sensitivity) 320 (HH) <18 ng/L    Comment: CRITICAL RESULT CALLED  TO, READ BACK BY AND VERIFIED WITH KELLY PENDELTON @1536  02/08/21 MJU (NOTE) Elevated high sensitivity troponin I (hsTnI) values and significant  changes across serial measurements may suggest ACS but many other  chronic and acute conditions are known to elevate hsTnI results.  Refer to the "Links" section for chest pain algorithms and additional  guidance. Performed at [facility], [address] [place], [place] [address]   Troponin I (High Sensitivity)     Status: Abnormal   Collection Time: 02/08/21  4:33 PM  Result Value Ref Range   Troponin I (High Sensitivity) 334 (HH) <18 ng/L    Comment: CRITICAL VALUE NOTED. VALUE IS CONSISTENT WITH PREVIOUSLY REPORTED/CALLED VALUE SKL (NOTE) Elevated high sensitivity troponin I (hsTnI) values and significant  changes across serial measurements may suggest ACS but many other  chronic and acute conditions are known to elevate hsTnI results.  Refer to the "Links" section for chest pain algorithms and additional  guidance. Performed at [facility], [place], [address] [place], [place] [address]   Resp Panel by RT-PCR (Flu A&B, Covid) Nasopharyngeal Swab     Status: Abnormal   Collection Time: 02/08/21  4:34 PM   Specimen: Nasopharyngeal Swab; Nasopharyngeal(NP) swabs in vial transport medium  Result Value Ref Range   SARS Coronavirus 2 by RT PCR POSITIVE (A) NEGATIVE    Comment: RESULT CALLED TO, READ BACK BY AND VERIFIED WITH: JESS ANDERSON @1750  02/08/21 MJU (NOTE) SARS-CoV-2 target nucleic acids are DETECTED.  The SARS-CoV-2 RNA is generally detectable in upper respiratory specimens during the acute phase of infection. Positive results are indicative of the presence of the identified virus, but do not rule out bacterial infection or co-infection with other pathogens not detected by the test. Clinical correlation with patient history and other diagnostic information is necessary to determine patient infection status.  The expected result is Negative.  Fact Sheet for Patients: EntrepreneurPulse.com.au  Fact Sheet for Healthcare Providers: IncredibleEmployment.be  This test is not yet approved or cleared by the [place] FDA and  has been authorized for detection and/or diagnosis of SARS-CoV-2 by FDA under an Emergency Use Authorization (EUA).  This EUA will remain in effect (meaning this test can be u sed) for the duration of  the COVID-19 declaration under Section 564(b)(1) of the Act, 21 U.S.C. section 360bbb-3(b)(1), unless the authorization is terminated or revoked sooner.     Influenza A by PCR NEGATIVE NEGATIVE   Influenza B by PCR NEGATIVE NEGATIVE    Comment: (NOTE) The Xpert Xpress SARS-CoV-2/FLU/RSV plus assay is intended as an aid in the  diagnosis of influenza from Nasopharyngeal swab specimens and should not be used as a sole basis for treatment. Nasal washings and aspirates are unacceptable for Xpert Xpress SARS-CoV-2/FLU/RSV testing.  Fact Sheet for Patients: EntrepreneurPulse.com.au  Fact Sheet for Healthcare Providers: IncredibleEmployment.be  This test is not yet approved or cleared by the [place] FDA and has been authorized for detection and/or diagnosis of SARS-CoV-2 by FDA under an Emergency Use Authorization (EUA). This EUA will remain in effect (meaning this test can be used) for the duration of the COVID-19 declaration under Section 564(b)(1) of the Act, 21 U.S.C. section 360bbb-3(b)(1), unless the authorization is terminated or revoked.  Performed at [facility], [place], [place] [address]   Troponin I (High Sensitivity)     Status: Abnormal   Collection Time: 02/08/21  5:24 PM  Result Value Ref Range   Troponin I (High Sensitivity) 259 (HH) <18 ng/L    Comment: CRITICAL VALUE NOTED. VALUE IS CONSISTENT WITH PREVIOUSLY REPORTED/CALLED VALUE  SKL (NOTE) Elevated high sensitivity troponin I (hsTnI) values and significant  changes across serial measurements may suggest ACS but many other  chronic and acute conditions are known to elevate hsTnI results.  Refer to the "Links" section for chest pain algorithms and additional  guidance. Performed at [facility], [address] [place], [place] [address]   D-dimer, quantitative     Status: Abnormal   Collection Time: 02/08/21  5:24 PM  Result Value Ref Range   D-Dimer, Quant 2.35 (H) 0.00 - 0.50 ug/mL-FEU    Comment: (NOTE) At the manufacturer cut-off value of 0.5 g/mL FEU, this assay has a negative predictive value of 95-100%.This assay is intended for use in conjunction with a clinical pretest probability (PTP) assessment model to exclude pulmonary embolism (PE) and deep venous thrombosis (DVT) in outpatients suspected of PE or DVT. Results should be correlated with clinical presentation. Performed at [facility], [address] [place], [place] [address]   Protime-INR     Status: Abnormal   Collection Time: 02/08/21  5:24 PM  Result Value Ref Range   Prothrombin Time 16.9 (H) 11.4 - 15.2 seconds   INR 1.4 (H) 0.8 - 1.2    Comment: (NOTE) INR goal varies based on device and disease states. Performed at [facility], [address] [place], [place] [address]   APTT     Status: Abnormal   Collection Time: 02/08/21  5:24 PM  Result Value Ref Range   aPTT 37 (H) 24 - 36 seconds    Comment:        IF BASELINE aPTT IS ELEVATED, SUGGEST PATIENT RISK ASSESSMENT BE USED TO DETERMINE APPROPRIATE ANTICOAGULANT THERAPY. Performed at [facility], [address] [place], [place] [address]   Glucose, capillary     Status: Abnormal   Collection Time: 02/08/21  9:40 PM  Result Value Ref Range   Glucose-Capillary 445 (H) 70 - 99 mg/dL    Comment: Glucose reference range applies only to samples taken after fasting for at least 8  hours.  Comprehensive metabolic panel     Status: Abnormal   Collection Time: 02/09/21  1:29 AM  Result Value Ref Range   Sodium 127 (L) 135 - 145 mmol/L   Potassium 3.0 (L) 3.5 - 5.1 mmol/L   Chloride 88 (L) 98 - 111 mmol/L   CO2 24 22 - 32 mmol/L   Glucose, Bld 449 (H) 70 - 99 mg/dL    Comment: Glucose  reference range applies only to samples taken after fasting for at least 8 hours.   BUN 60 (H) 8 - 23 mg/dL   Creatinine, Ser 3.44 (H) 0.61 - 1.24 mg/dL   Calcium 8.5 (L) 8.9 - 10.3 mg/dL   Total Protein 7.4 6.5 - 8.1 g/dL   Albumin 2.8 (L) 3.5 - 5.0 g/dL   AST 24 15 - 41 U/L   ALT 22 0 - 44 U/L   Alkaline Phosphatase 59 38 - 126 U/L   Total Bilirubin 1.2 0.3 - 1.2 mg/dL   GFR, Estimated 19 (L) >60 mL/min    Comment: (NOTE) Calculated using the CKD-EPI Creatinine Equation (2021)    Anion gap 15 5 - 15    Comment: Performed at [facility], [address] [place], [place] [address]  Magnesium     Status: None   Collection Time: 02/09/21  1:29 AM  Result Value Ref Range   Magnesium 2.1 1.7 - 2.4 mg/dL    Comment: Performed at [facility], [address] [place], [place] [address]  CBC     Status: Abnormal   Collection Time: 02/09/21  1:29 AM  Result Value Ref Range   WBC 9.5 4.0 - 10.5 K/uL   RBC 3.84 (L) 4.22 - 5.81 MIL/uL   Hemoglobin 12.1 (L) 13.0 - 17.0 g/dL   HCT 33.1 (L) 39.0 - 52.0 %   MCV 86.2 80.0 - 100.0 fL   MCH 31.5 26.0 - 34.0 pg   MCHC 36.6 (H) 30.0 - 36.0 g/dL   RDW 13.5 11.5 - 15.5 %   Platelets 325 150 - 400 K/uL   nRBC 0.0 0.0 - 0.2 %    Comment: Performed at [facility], [place], [place] [address]  Heparin level (unfractionated)     Status: Abnormal   Collection Time: 02/09/21  1:29 AM  Result Value Ref Range   Heparin Unfractionated <0.10 (L) 0.30 - 0.70 IU/mL    Comment: (NOTE) The clinical reportable range upper limit is being lowered to >1.10 to align with the FDA approved guidance for the current  laboratory assay.  If heparin results are below expected values, and patient dosage has  been confirmed, suggest follow up testing of antithrombin III levels. Performed at [facility], [address] [place], [place] [address]   Phosphorus     Status: None   Collection Time: 02/09/21  1:29 AM  Result Value Ref Range   Phosphorus 4.5 2.5 - 4.6 mg/dL    Comment: Performed at [facility], [place], [place] [address]  Procalcitonin - Baseline     Status: None   Collection Time: 02/09/21  1:29 AM  Result Value Ref Range   Procalcitonin 1.22 ng/mL    Comment:        Interpretation: PCT > 0.5 ng/mL and <= 2 ng/mL: Systemic infection (sepsis) is possible, but other conditions are known to elevate PCT as well. (NOTE)       Sepsis PCT Algorithm           Lower Respiratory Tract                                      Infection PCT Algorithm    ----------------------------     ----------------------------         PCT < 0.25 ng/mL  PCT < 0.10 ng/mL          Strongly encourage             Strongly discourage   discontinuation of antibiotics    initiation of antibiotics    ----------------------------     -----------------------------       PCT 0.25 - 0.50 ng/mL            PCT 0.10 - 0.25 ng/mL               OR       >80% decrease in PCT            Discourage initiation of                                            antibiotics      Encourage discontinuation           of antibiotics    ----------------------------     -----------------------------         PCT >= 0.50 ng/mL              PCT 0.26 - 0.50 ng/mL                AND       <80% decrease in PCT             Encourage initiation of                                             antibiotics       Encourage continuation           of antibiotics    ----------------------------     -----------------------------        PCT >= 0.50 ng/mL                  PCT > 0.50 ng/mL                AND         increase in PCT                  Strongly encourage                                      initiation of antibiotics    Strongly encourage escalation           of antibiotics                                     -----------------------------                                           PCT <= 0.25 ng/mL                                                 OR                                        >  80% decrease in PCT                                      Discontinue / Do not initiate                                             antibiotics  Performed at [facility], [address] [place], [place] [address]   Brain natriuretic peptide     Status: Abnormal   Collection Time: 02/09/21  1:29 AM  Result Value Ref Range   B Natriuretic Peptide 470.8 (H) 0.0 - 100.0 pg/mL    Comment: Performed at [facility], [address] [place], [place] [address]  Glucose, capillary     Status: Abnormal   Collection Time: 02/09/21  5:19 AM  Result Value Ref Range   Glucose-Capillary 518 (HH) 70 - 99 mg/dL    Comment: Glucose reference range applies only to samples taken after fasting for at least 8 hours.  Glucose, capillary     Status: Abnormal   Collection Time: 02/09/21  9:05 AM  Result Value Ref Range   Glucose-Capillary 569 (HH) 70 - 99 mg/dL    Comment: Glucose reference range applies only to samples taken after fasting for at least 8 hours.   Comment 1 Notify RN   ECHOCARDIOGRAM COMPLETE     Status: None   Collection Time: 02/09/21 10:01 AM  Result Value Ref Range   Weight 3,336.88 oz   Height 74 in   BP 164/76 mmHg   Ao pk vel 0.96 m/s   AV Area VTI 2.72 cm2   AR max vel 2.26 cm2   AV Mean grad 2.0 mmHg   AV Peak grad 3.7 mmHg   S' Lateral 3.10 cm   AV Area mean vel 1.82 cm2   Area-P 1/2 4.36 cm2  Heparin level (unfractionated)     Status: Abnormal   Collection Time: 02/09/21 11:20 AM  Result Value Ref Range   Heparin Unfractionated 0.14 (L) 0.30 - 0.70  IU/mL    Comment: (NOTE) The clinical reportable range upper limit is being lowered to >1.10 to align with the FDA approved guidance for the current laboratory assay.  If heparin results are below expected values, and patient dosage has  been confirmed, suggest follow up testing of antithrombin III levels. Performed at [facility], [address] [place], [place] [address]   Glucose, capillary     Status: Abnormal   Collection Time: 02/09/21 11:54 AM  Result Value Ref Range   Glucose-Capillary 463 (H) 70 - 99 mg/dL    Comment: Glucose reference range applies only to samples taken after fasting for at least 8 hours.  Glucose, capillary     Status: Abnormal   Collection Time: 02/09/21  4:45 PM  Result Value Ref Range   Glucose-Capillary 306 (H) 70 - 99 mg/dL    Comment: Glucose reference range applies only to samples taken after fasting for at least 8 hours.  Glucose, capillary     Status: Abnormal   Collection Time: 02/09/21  8:59 PM  Result Value Ref Range   Glucose-Capillary 244 (H) 70 - 99 mg/dL    Comment: Glucose reference range applies only to samples taken after fasting for at least 8 hours.  Glucose, capillary     Status: Abnormal   Collection Time: 02/10/21  1:29 AM  Result Value Ref Range   Glucose-Capillary 268 (H) 70 - 99 mg/dL    Comment: Glucose reference range applies only to samples taken after fasting for at least 8 hours.  CBC     Status: Abnormal   Collection Time: 02/10/21  4:35 AM  Result Value Ref Range   WBC 9.9 4.0 - 10.5 K/uL   RBC 4.10 (L) 4.22 - 5.81 MIL/uL   Hemoglobin 12.9 (L) 13.0 - 17.0 g/dL   HCT 35.4 (L) 39.0 - 52.0 %   MCV 86.3 80.0 - 100.0 fL   MCH 31.5 26.0 - 34.0 pg   MCHC 36.4 (H) 30.0 - 36.0 g/dL   RDW 13.6 11.5 - 15.5 %   Platelets 377 150 - 400 K/uL   nRBC 0.0 0.0 - 0.2 %    Comment: Performed at [facility], [address] [place], [place] [address]  Comprehensive metabolic panel     Status: Abnormal    Collection Time: 02/10/21  4:35 AM  Result Value Ref Range   Sodium 133 (L) 135 - 145 mmol/L   Potassium 3.3 (L) 3.5 - 5.1 mmol/L   Chloride 94 (L) 98 - 111 mmol/L   CO2 26 22 - 32 mmol/L   Glucose, Bld 275 (H) 70 - 99 mg/dL    Comment: Glucose reference range applies only to samples taken after fasting for at least 8 hours.   BUN 71 (H) 8 - 23 mg/dL   Creatinine, Ser 3.04 (H) 0.61 - 1.24 mg/dL   Calcium 8.8 (L) 8.9 - 10.3 mg/dL   Total Protein 7.3 6.5 - 8.1 g/dL   Albumin 3.0 (L) 3.5 - 5.0 g/dL   AST 40 15 - 41 U/L   ALT 25 0 - 44 U/L   Alkaline Phosphatase 68 38 - 126 U/L   Total Bilirubin 0.7 0.3 - 1.2 mg/dL   GFR, Estimated 23 (L) >60 mL/min    Comment: (NOTE) Calculated using the CKD-EPI Creatinine Equation (2021)    Anion gap 13 5 - 15    Comment: Performed at [facility], [address] [place], [place] [address]  Glucose, capillary     Status: Abnormal   Collection Time: 02/10/21  7:32 AM  Result Value Ref Range   Glucose-Capillary 273 (H) 70 - 99 mg/dL    Comment: Glucose reference range applies only to samples taken after fasting for at least 8 hours.  Glucose, capillary     Status: Abnormal   Collection Time: 02/10/21 11:27 AM  Result Value Ref Range   Glucose-Capillary 362 (H) 70 - 99 mg/dL    Comment: Glucose reference range applies only to samples taken after fasting for at least 8 hours.  Glucose, capillary     Status: Abnormal   Collection Time: 02/10/21  4:37 PM  Result Value Ref Range   Glucose-Capillary 357 (H) 70 - 99 mg/dL    Comment: Glucose reference range applies only to samples taken after fasting for at least 8 hours.  Glucose, capillary     Status: Abnormal   Collection Time: 02/10/21  9:24 PM  Result Value Ref Range   Glucose-Capillary 322 (H) 70 - 99 mg/dL    Comment: Glucose reference range applies only to samples taken after fasting for at least 8 hours.  CBC     Status: Abnormal   Collection Time: 02/11/21  6:31 AM  Result  Value  Ref Range   WBC 12.1 (H) 4.0 - 10.5 K/uL   RBC 3.87 (L) 4.22 - 5.81 MIL/uL   Hemoglobin 12.6 (L) 13.0 - 17.0 g/dL   HCT 33.9 (L) 39.0 - 52.0 %   MCV 87.6 80.0 - 100.0 fL   MCH 32.6 26.0 - 34.0 pg   MCHC 37.2 (H) 30.0 - 36.0 g/dL   RDW 13.8 11.5 - 15.5 %   Platelets 365 150 - 400 K/uL   nRBC 0.0 0.0 - 0.2 %    Comment: Performed at [facility], [address] [place], [place] [address]  Basic metabolic panel     Status: Abnormal   Collection Time: 02/11/21  6:31 AM  Result Value Ref Range   Sodium 134 (L) 135 - 145 mmol/L   Potassium 3.0 (L) 3.5 - 5.1 mmol/L   Chloride 99 98 - 111 mmol/L   CO2 22 22 - 32 mmol/L   Glucose, Bld 348 (H) 70 - 99 mg/dL    Comment: Glucose reference range applies only to samples taken after fasting for at least 8 hours.   BUN 74 (H) 8 - 23 mg/dL   Creatinine, Ser 2.77 (H) 0.61 - 1.24 mg/dL   Calcium 8.5 (L) 8.9 - 10.3 mg/dL   GFR, Estimated 25 (L) >60 mL/min    Comment: (NOTE) Calculated using the CKD-EPI Creatinine Equation (2021)    Anion gap 13 5 - 15    Comment: Performed at [facility], [address] [place], [place] [address]  Glucose, capillary     Status: Abnormal   Collection Time: 02/11/21  8:17 AM  Result Value Ref Range   Glucose-Capillary 334 (H) 70 - 99 mg/dL    Comment: Glucose reference range applies only to samples taken after fasting for at least 8 hours.  Basic metabolic panel     Status: Abnormal   Collection Time: 02/21/21  3:50 PM  Result Value Ref Range   Sodium 136 135 - 145 mmol/L   Potassium 5.0 3.5 - 5.1 mmol/L   Chloride 105 98 - 111 mmol/L   CO2 24 22 - 32 mmol/L   Glucose, Bld 206 (H) 70 - 99 mg/dL    Comment: Glucose reference range applies only to samples taken after fasting for at least 8 hours.   BUN 46 (H) 8 - 23 mg/dL   Creatinine, Ser 2.34 (H) 0.61 - 1.24 mg/dL   Calcium 8.4 (L) 8.9 - 10.3 mg/dL   GFR, Estimated 31 (L) >60 mL/min    Comment: (NOTE) Calculated using the CKD-EPI  Creatinine Equation (2021)    Anion gap 7 5 - 15    Comment: Performed at [facility], [address] [place], [place] [address]    Radiology No results found.  Assessment/Plan Diabetes (HCC) blood glucose control important in reducing the progression of atherosclerotic disease. Also, involved in wound healing. On appropriate medications.     Essential hypertension blood pressure control important in reducing the progression of atherosclerotic disease. On appropriate oral medications.     CKD (chronic kidney disease) stage 3, GFR 30-59 ml/min Chronic kidney disease can certainly be contributing to lower extremity swelling.  He is on diuretic therapy.  Lymphedema Even with his decreased mobility with his foot drop, his swelling is still under pretty good control.  He has been elevating and using compression well.  PAD (peripheral artery disease) (HCC) ABIs today remain in the normal range at 1.04 on the right and  0.93 on the left with digit pressures of over 100.  No worrisome PAD symptoms at this point.  Recheck in 1 year.    Leotis Pain, MD  04/10/2021 12:04 PM    This note was created with Dragon medical transcription system.  Any errors from dictation are purely unintentional

## 2021-04-10 NOTE — Assessment & Plan Note (Signed)
ABIs today remain in the normal range at 1.04 on the right and 0.93 on the left with digit pressures of over 100.  No worrisome PAD symptoms at this point.  Recheck in 1 year.

## 2021-04-16 ENCOUNTER — Other Ambulatory Visit: Payer: Self-pay

## 2021-04-16 ENCOUNTER — Ambulatory Visit: Payer: Managed Care, Other (non HMO) | Admitting: Podiatry

## 2021-04-16 ENCOUNTER — Ambulatory Visit: Payer: Managed Care, Other (non HMO) | Admitting: Nurse Practitioner

## 2021-04-16 DIAGNOSIS — Z794 Long term (current) use of insulin: Secondary | ICD-10-CM | POA: Diagnosis not present

## 2021-04-16 DIAGNOSIS — M21372 Foot drop, left foot: Secondary | ICD-10-CM

## 2021-04-16 DIAGNOSIS — E1142 Type 2 diabetes mellitus with diabetic polyneuropathy: Secondary | ICD-10-CM

## 2021-04-16 NOTE — Progress Notes (Signed)
He presents today with his scooter states that the neuropathy is getting worse is all the way up to his thigh and he states that he is now developed foot drop from the motor neuropathy that he has in his left leg.  States that he can still walk a little bit but he is more prone to fall.  He states that currently his last A1c was a 10.0.  He states overall he is doing well.  He states that his last vascular exam demonstrated a ABI of 1.04 on the right and the left was 0.92  Objective: Vital signs are stable alert oriented x3 pulses are palpable.  Neurologic sensorium is completely lost.  Muscle strength limited in all muscle groups bilateral no dorsiflexion left foot at all.  No open lesions or wounds hallux nails bilaterally are dystrophic and deciduous.  No open lesions or wounds.  Assessment: Severe diabetic peripheral neuropathy uncontrolled diabetes dropfoot left dystrophic hallux nail.  Plan: Debridement of all nails 1 through 5 bilateral.  May need to consider a dropfoot brace left.

## 2021-04-17 ENCOUNTER — Ambulatory Visit (INDEPENDENT_AMBULATORY_CARE_PROVIDER_SITE_OTHER): Payer: Managed Care, Other (non HMO) | Admitting: Nurse Practitioner

## 2021-04-17 ENCOUNTER — Encounter: Payer: Self-pay | Admitting: Nurse Practitioner

## 2021-04-17 VITALS — BP 151/77 | HR 85 | Temp 98.4°F | Resp 16 | Ht 74.0 in | Wt 214.0 lb

## 2021-04-17 DIAGNOSIS — E1140 Type 2 diabetes mellitus with diabetic neuropathy, unspecified: Secondary | ICD-10-CM | POA: Diagnosis not present

## 2021-04-17 DIAGNOSIS — I10 Essential (primary) hypertension: Secondary | ICD-10-CM

## 2021-04-17 DIAGNOSIS — I5022 Chronic systolic (congestive) heart failure: Secondary | ICD-10-CM

## 2021-04-17 DIAGNOSIS — I700 Atherosclerosis of aorta: Secondary | ICD-10-CM

## 2021-04-17 DIAGNOSIS — Z794 Long term (current) use of insulin: Secondary | ICD-10-CM

## 2021-04-17 DIAGNOSIS — N1832 Chronic kidney disease, stage 3b: Secondary | ICD-10-CM

## 2021-04-17 NOTE — Progress Notes (Signed)
[facility] [place], [place] [address]  Internal MEDICINE  Office Visit Note  Patient Name: [name] [name]  [id number]  [id number]  Date of Service: 04/17/2021  Chief Complaint  Patient presents with   Follow-up   Diabetes    Sees endo last a1c done 5 days ago 10.1   Medication Refill   Gastroesophageal Reflux   Hypertension   Hyperlipidemia    HPI [name] [name] presents for a follow up visit for diabetes. This visit was originally to check his A1C again jsut in case he had not established with his new endocrinologist. He had his initial visit with the endocrinologist on 04/12/21 and his A1c was checked and it had improved to 10.7. The patient reports having a positive visit with this new endocrinologist and will be following up in about 4 or 5 weeks with them. He has not made any changes in his diet so the patient is concerned that his A1C had improved with no changes. He reports that it does this sometimes and fluctuates for no reason. He feels optimistic that an effective treatment regimen will be figured out with this new endocrinologist. His endocrinologist is working on getting the patient set up with CGM via Dexcom. Right now he checks his glucose level 8 times per day -- first thing in the morning, preprandial and postprandial x3 and at bedtime.  His kidney function is down and his GFR is at 22. His nephrologist has referred him for dialysis education.  Sees nephrology an cardiology and needs to check with them to make sure it is ok to start on [place] which was recommended by his endocrinologist.  Patient does not need any medication refills right now.    Current Medication: Outpatient Encounter Medications as of 04/17/2021  Medication Sig   albuterol (VENTOLIN HFA) 108 (90 Base) MCG/ACT inhaler SMARTSIG:1 Inhalation By Mouth Every 4-6 Hours PRN   Alirocumab (PRALUENT) 150 MG/ML SOAJ Inject 1 pen into the skin every 14 (fourteen) days.   aspirin EC 81 MG  tablet Take 81 mg by mouth daily. Swallow whole.   carvedilol (COREG) 12.5 MG tablet TAKE 18.5 MG  (1 1/2 PILL) TWICE A DAY   cholecalciferol (VITAMIN D3) 25 MCG (1000 UNIT) tablet Take 2,000 Units by mouth daily.   ezetimibe (ZETIA) 10 MG tablet Take 1 tablet (10 mg total) by mouth daily.   insulin degludec (TRESIBA FLEXTOUCH) 200 UNIT/ML FlexTouch Pen Inject 140 Units into the skin daily.   insulin lispro (HUMALOG) 100 UNIT/ML KwikPen 20-40 units per sliding scale   Insulin Pen Needle (B-D ULTRAFINE III SHORT PEN) 31G X 8 MM MISC To use with pen basal and mealtime coverage insulin pens 5 times daily. DX: E11.65   isosorbide mononitrate (IMDUR) 30 MG 24 hr tablet Take 1 tablet (30 mg total) by mouth 2 (two) times daily.   metoCLOPramide (REGLAN) 5 MG tablet One tab before each meal and one at bed time for dyspepsia prn   nortriptyline (PAMELOR) 10 MG capsule Take 30 mg by mouth at bedtime.   torsemide (DEMADEX) 20 MG tablet Take 40 mg daily (two tabs), may take additional 40 mg (two tabs) in the evening for swelling to lower legs   pantoprazole (PROTONIX) 40 MG tablet Take 1 tablet (40 mg total) by mouth 2 (two) times daily before a meal.   [DISCONTINUED] augmented betamethasone dipropionate (DIPROLENE) 0.05 % ointment Apply topically 2 (two) times daily. (Patient not taking: No sig reported)   [DISCONTINUED] benzonatate (TESSALON)  100 MG capsule Take 1 capsule (100 mg total) by mouth 3 (three) times daily. (Patient not taking: No sig reported)   [DISCONTINUED] chlorpheniramine-HYDROcodone (TUSSIONEX) 10-8 MG/5ML SUER Take 5 mLs by mouth every 12 (twelve) hours as needed for cough. (Patient not taking: No sig reported)   [DISCONTINUED] potassium chloride SA (KLOR-CON) 20 MEQ tablet Take 1 tab daily with torsemide (Patient not taking: No sig reported)   No facility-administered encounter medications on file as of 04/17/2021.    Surgical History: Past Surgical History:  Procedure Laterality Date    bone graft surgery     CARDIAC CATHETERIZATION  2013   S/p PCI    CARDIAC CATHETERIZATION  2018   S/p CABG   CARDIAC SURGERY     cataract surgery Bilateral    CORONARY ARTERY BYPASS GRAFT  2018   (LIMA-LAD,VG-RCA,VG-OM1,VG-D1)   ESOPHAGOGASTRODUODENOSCOPY (EGD) WITH PROPOFOL N/A 04/04/2020   Procedure: ESOPHAGOGASTRODUODENOSCOPY (EGD) WITH PROPOFOL;  Surgeon: Lucilla Lame, MD;  Location: [facility] ENDOSCOPY;  Service: Endoscopy;  Laterality: N/A;   ESOPHAGOGASTRODUODENOSCOPY (EGD) WITH PROPOFOL N/A 06/06/2020   Procedure: ESOPHAGOGASTRODUODENOSCOPY (EGD) WITH PROPOFOL;  Surgeon: Lucilla Lame, MD;  Location: [facility] ENDOSCOPY;  Service: Endoscopy;  Laterality: N/A;   EYE SURGERY     PERIPHERAL VASCULAR CATHETERIZATION Right 10/28/2016   PTA/DEB Right SFA   WRIST SURGERY      Medical History: Past Medical History:  Diagnosis Date   Arthritis    CAD (coronary artery disease)    a. 12/2016 s/p CABG x 4 (LIMA->LAD, VG->RCA, VG->OM1, VG->D1); b. 02/2018 MV: EF 35%, small-med inferolaterlal infarct. No ischemia.   CKD (chronic kidney disease), stage IV (HCC)    Colon polyps    Diabetes ([place])    Dupre's syndrome    GERD (gastroesophageal reflux disease)    HFrEF (heart failure reduced ejection fraction) ([place])    a. 2018 EF 35%; b. 02/2018 EF 50%; c. 02/2019 EF 40-45%; d. 08/2019 Echo: EF 50-55%, no rwma, GrII DD; e. 04/2020 Echo: EF 30-35%, mod-sev glob HK. Mild LVH. GrII DD. Low-nl RV fxn. Mildly dil LA. Mild MR. Mild-mod Ao sclerosis w/o stenosis.   Hyperlipidemia    Hypertension    Ischemic cardiomyopathy    a. 02/2018 MV: EF 35%; b. 08/2019 Echo: EF 50-55%; c. 04/2020 Echo: EF 30-35%.   Lymphedema    Migraine    Neuropathy    PAD (peripheral artery disease) ([place])    a. 04/2017 Aortoiliac duplex: R iliac dzs; b. 03/2020 LE Duplex: mild bilat atherosclerosis throughout. Patent vessels.   Retinopathy    Sleep apnea    Stomach ulcer     Family History: Family History  Problem Relation  Age of Onset   Diabetes Mother    Heart disease Father     Social History   Socioeconomic History   Marital status: Married    Spouse name: Not on file   Number of children: Not on file   Years of education: Not on file   Highest education level: Not on file  Occupational History   Not on file  Tobacco Use   Smoking status: Former    Packs/day: 2.00    Years: 39.00    Pack years: 78.00    Types: Cigarettes    Quit date: 05/18/2012    Years since quitting: 8.9   Smokeless tobacco: Never  Vaping Use   Vaping Use: Never used  Substance and Sexual Activity   Alcohol use: Yes    Comment: very rarely - wine  Drug use: Never   Sexual activity: Not on file  Other Topics Concern   Not on file  Social History Narrative   Not on file   Social Determinants of Health   Financial Resource Strain: Not on file  Food Insecurity: Not on file  Transportation Needs: Not on file  Physical Activity: Not on file  Stress: Not on file  Social Connections: Not on file  Intimate Partner Violence: Not on file      Review of Systems  Constitutional:  Negative for chills, fatigue and unexpected weight change.  HENT:  Negative for congestion, rhinorrhea, sneezing and sore throat.   Eyes:  Negative for redness.  Respiratory:  Negative for cough, chest tightness and shortness of breath.   Cardiovascular:  Negative for chest pain and palpitations.  Gastrointestinal:  Negative for abdominal pain, constipation, diarrhea, nausea and vomiting.  Genitourinary:  Negative for dysuria and frequency.  Musculoskeletal:  Negative for arthralgias, back pain, joint swelling and neck pain.  Skin:  Negative for rash.  Neurological: Negative.  Negative for tremors and numbness.  Hematological:  Negative for adenopathy. Does not bruise/bleed easily.  Psychiatric/Behavioral:  Negative for behavioral problems (Depression), sleep disturbance and suicidal ideas. The patient is not nervous/anxious.    Vital  Signs: BP (!) 151/77   Pulse 85   Temp 98.4 F (36.9 C)   Resp 16   Ht 6\' 2"  (1.88 m)   Wt 214 lb (97.1 kg)   SpO2 98%   BMI 27.48 kg/m    Physical Exam Vitals reviewed.  Constitutional:      General: He is not in acute distress.    Appearance: Normal appearance. He is not ill-appearing.  HENT:     Head: Normocephalic and atraumatic.  Eyes:     Extraocular Movements: Extraocular movements intact.     Pupils: Pupils are equal, round, and reactive to light.  Cardiovascular:     Rate and Rhythm: Normal rate and regular rhythm.  Pulmonary:     Effort: Pulmonary effort is normal. No respiratory distress.  Neurological:     Mental Status: He is alert and oriented to person, place, and time.  Psychiatric:        Mood and Affect: Mood normal.        Behavior: Behavior normal.       Assessment/Plan: 1. Essential hypertension Followed by cardiology, although BP is elevated, patient would prefer to discuss medication changes with his cardiologist.   2. Type 2 diabetes mellitus with diabetic neuropathy, with long-term current use of insulin ([place]) Had initial consult with new endocrinologist, A1C has improved without patient making any significant changes. He is having his diabetic medications and repeat A1C managed by endocrinology now. His specialist wants him to take [place] if his cardiologist and nephrologist ok it.   3. Stage 3b chronic kidney disease ([place]) Followed by nephrology and has been referred for dialysis education, last GFR is 22 per patient. Patient plans to task his nephrologist if he can take farxiga.   4. Chronic systolic CHF (congestive heart failure) ([place]) Managed by cardiology. Patient plans to ask his cardiologist if he can take farxiga.   5. Aortic atherosclerosis (HCC) On praluent and ezetimibe, managed by cardiology.    General Counseling: darald uzzle understanding of the findings of todays visit and agrees with plan of treatment. I have  discussed any further diagnostic evaluation that may be needed or ordered today. We also reviewed his medications today. he has been encouraged to  call the office with any questions or concerns that should arise related to todays visit.    No orders of the defined types were placed in this encounter.   No orders of the defined types were placed in this encounter.   Return in about 8 months (around 12/15/2021) for previously scheduled, CPE, Saavi Mceachron PCP, gets A1C checked with endocrinology.   Total time spent:30 Minutes Time spent includes review of chart, medications, test results, and follow up plan with the patient.   [place] Controlled Substance Database was reviewed by me.  This patient was seen by Jonetta Osgood, FNP-C in collaboration with Dr. [name] as a part of collaborative care agreement.   Alicia Ackert R. Valetta Fuller, MSN, FNP-C Internal medicine

## 2021-04-24 ENCOUNTER — Telehealth: Payer: Self-pay | Admitting: Cardiovascular Disease

## 2021-04-24 NOTE — Telephone Encounter (Signed)
Patient calling States that his endocrinologist wants to prescribe farxiga medication Would like to know if Dr Rockey Situ approves Please call to discuss

## 2021-04-24 NOTE — Telephone Encounter (Signed)
Was able to reach back out to [name] [name] regarding [name] [name], he reports his endocrinologist would like to start him on this at 10 mg.  Advised on intolerance to this in the past, per [name] [name] notes dating back till May of this year Medication intolerances On farxiga/jardiance gets "pancreatitis"  [name] [name] stated him and his endocrinologist discuss the intolerance and believe there may not be a correlation with the medication and pancreatitis, they would like to try for [place], and if unable to handle medication d/t pancreatitis they will d/c medication, but feels this medication would be beneficial for his CHF and his diabetes.  Advised will send to [name] [name] for his recommendations and will call back with his response in a few days.  Pt verbalized understanding and is thankful for return call, will await response.

## 2021-04-25 NOTE — Telephone Encounter (Signed)
Was able to return call to Mr. Summerall to advise on Dr. Donivan Scull recommendation for starting Wilder Glade  We can start the Farxiga or Jardiance, whichever insurance will cover best  We may have a 30-day coupon  Thx  TG   Mr. Buer would like endocrinologist to prescribe medication as they will be following and monitoring for possible pancarditis flares. Otherwise, thankful for the return can will reach out to endocrinologist.

## 2021-05-01 ENCOUNTER — Ambulatory Visit: Payer: Self-pay | Admitting: Internal Medicine

## 2021-05-11 ENCOUNTER — Encounter: Payer: Self-pay | Admitting: Cardiovascular Disease

## 2021-05-11 ENCOUNTER — Ambulatory Visit (INDEPENDENT_AMBULATORY_CARE_PROVIDER_SITE_OTHER): Payer: Managed Care, Other (non HMO) | Admitting: Cardiovascular Disease

## 2021-05-11 ENCOUNTER — Other Ambulatory Visit: Payer: Self-pay

## 2021-05-11 VITALS — BP 140/78 | HR 83 | Ht 74.0 in | Wt 212.2 lb

## 2021-05-11 DIAGNOSIS — I5022 Chronic systolic (congestive) heart failure: Secondary | ICD-10-CM

## 2021-05-11 DIAGNOSIS — I2510 Atherosclerotic heart disease of native coronary artery without angina pectoris: Secondary | ICD-10-CM

## 2021-05-11 DIAGNOSIS — E1122 Type 2 diabetes mellitus with diabetic chronic kidney disease: Secondary | ICD-10-CM

## 2021-05-11 DIAGNOSIS — I700 Atherosclerosis of aorta: Secondary | ICD-10-CM

## 2021-05-11 DIAGNOSIS — M7989 Other specified soft tissue disorders: Secondary | ICD-10-CM

## 2021-05-11 DIAGNOSIS — I890 Lymphedema, not elsewhere classified: Secondary | ICD-10-CM

## 2021-05-11 DIAGNOSIS — Z794 Long term (current) use of insulin: Secondary | ICD-10-CM

## 2021-05-11 DIAGNOSIS — I252 Old myocardial infarction: Secondary | ICD-10-CM

## 2021-05-11 DIAGNOSIS — I10 Essential (primary) hypertension: Secondary | ICD-10-CM

## 2021-05-11 DIAGNOSIS — N184 Chronic kidney disease, stage 4 (severe): Secondary | ICD-10-CM

## 2021-05-11 NOTE — Progress Notes (Signed)
Cardiology Office Note  Date:  05/11/2021   ID:  [name] [name], DOB July 01, 1960, MRN [id number]  PCP:  [name], MD   Chief Complaint  Patient presents with   Other    2 month f/u no complaints today. Meds reviewed verbally with pt.    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), HTN,  HLD,  GERD Former smoker quit 2013 Sleep apnea, unable to tolerate mask Statin intolerance Chronic renal insuff secondary to poorly controlled diabetes Who presents for routine follow-up of his coronary artery disease, history of bypass  LOV 01/2021  Reports requiring torsemide 40 twice daily Without such high dose torsemide has recurrent leg swelling, PND orthopnea Unfortunately with the high-dose torsemide creatinine bumped from 2-3 Followed by nephrology  Recent lab work reviewed Labs 03/2021 A1C 10.7 CR 3.14, GFR 22 on torsemide 40 BID  Followed by endocrine at [facility] Insulin changes, sugars running higher Glu 444 this AM, consistent 400s Yesterday 507 in am  Total chol on no medications 318 in sept 2019 Numbers much better on Praluent and Zetia down to 200  Bp better controlled  Poor sleep, CPA and bipap did not work, Working with Dr. Humphrey Rolls  Recovering from Covid: still with slight cough Was in the hospital 12/2020 hypoxia, cough, weakness, shortness of breath, COVID-positive, elevated troponin  Echocardiogram with ejection fraction 50 to 55%, septal wall hypokinesis consistent with postoperative state  Other past medical history reviewed Echo 04/2020 EF 30 to 35%  Medication intolerances On farxiga/jardiance gets "pancreatitis" Stopped hydralazine on his own, "intolerance"  Echocardiogram 08/2019  normal LV function, no evidence of valve disease or elevated right heart pressures  Outside notes from [facility]  initial ejection fraction 35%  presumably in 2018 Ejection fraction 50% in October 2019 Notes indicating ejection fraction 40 to 45% October 2020 [place] Myoview 2019 showing small to moderate inferolateral infarction without ischemia  Notes indicate previously unable to increase Entresto secondary to high potassium level  CABG details from [place] LIMA to the LAD Vein graft to the RCA Vein graft OM1 Vein graft to D1  Statin myalgias, rheumatoid issue  PMH:   has a past medical history of Arthritis, CAD (coronary artery disease), CKD (chronic kidney disease), stage IV ([place]), Colon polyps, Diabetes ([place]), Dupre's syndrome, GERD (gastroesophageal reflux disease), HFrEF (heart failure reduced ejection fraction) ([place]), Hyperlipidemia, Hypertension, Ischemic cardiomyopathy, Lymphedema, Migraine, Neuropathy, PAD (peripheral artery disease) ([place]), Retinopathy, Sleep apnea, and Stomach ulcer.  PSH:    Past Surgical History:  Procedure Laterality Date   bone graft surgery     CARDIAC CATHETERIZATION  2013   S/p PCI    CARDIAC CATHETERIZATION  2018   S/p CABG   CARDIAC SURGERY     cataract surgery Bilateral    CORONARY ARTERY BYPASS GRAFT  2018   (LIMA-LAD,VG-RCA,VG-OM1,VG-D1)   ESOPHAGOGASTRODUODENOSCOPY (EGD) WITH PROPOFOL N/A 04/04/2020   Procedure: ESOPHAGOGASTRODUODENOSCOPY (EGD) WITH PROPOFOL;  Surgeon: Lucilla Lame, MD;  Location: [facility];  Service: Endoscopy;  Laterality: N/A;   ESOPHAGOGASTRODUODENOSCOPY (EGD) WITH PROPOFOL N/A 06/06/2020   Procedure: ESOPHAGOGASTRODUODENOSCOPY (EGD) WITH PROPOFOL;  Surgeon: Lucilla Lame, MD;  Location: [facility] END[facility];  Service: Endoscopy;  Laterality: N/A;   EYE SURGERY     PERIPHERAL VASCULAR CATHETERIZATION Right 10/28/2016   PTA/DEB Right SFA   WRIST SURGERY      Current Outpatient Medications  Medication Sig Dispense Refill  albuterol (VENTOLIN HFA) 108 (90 Base) MCG/ACT inhaler SMARTSIG:1 Inhalation By Mouth Every 4-6 Hours PRN     Alirocumab  (PRALUENT) 150 MG/ML SOAJ Inject 1 pen into the skin every 14 (fourteen) days. 2 mL 11   aspirin EC 81 MG tablet Take 81 mg by mouth daily. Swallow whole.     carvedilol (COREG) 12.5 MG tablet TAKE 18.5 MG  (1 1/2 PILL) TWICE A DAY 225 tablet 3   cholecalciferol (VITAMIN D3) 25 MCG (1000 UNIT) tablet Take 2,000 Units by mouth daily.     ezetimibe (ZETIA) 10 MG tablet Take 1 tablet (10 mg total) by mouth daily. 90 tablet 3   Insulin Pen Needle (B-D ULTRAFINE III SHORT PEN) 31G X 8 MM MISC To use with pen basal and mealtime coverage insulin pens 5 times daily. DX: E11.65 500 each 1   insulin regular human CONCENTRATED (HUMULIN R U-500 KWIKPEN) 500 UNIT/ML KwikPen 40 units before breakfast, 30 units before lunch and 30 units before supper.     isosorbide mononitrate (IMDUR) 30 MG 24 hr tablet Take 1 tablet (30 mg total) by mouth 2 (two) times daily. 180 tablet 3   metoCLOPramide (REGLAN) 5 MG tablet One tab before each meal and one at bed time for dyspepsia prn 120 tablet 2   nortriptyline (PAMELOR) 10 MG capsule Take 30 mg by mouth at bedtime.     pantoprazole (PROTONIX) 40 MG tablet Take 1 tablet (40 mg total) by mouth 2 (two) times daily before a meal. 180 tablet 1   torsemide (DEMADEX) 20 MG tablet Take 40 mg daily (two tabs), may take additional 40 mg (two tabs) in the evening for swelling to lower legs 200 tablet 3   No current facility-administered medications for this visit.    Allergies:   Iodinated diagnostic agents, Other, Statins, Pregabalin, Sacubitril-valsartan, Atorvastatin, Pravastatin, and Rosuvastatin   Social History:  The patient  reports that he quit smoking about 8 years ago. His smoking use included cigarettes. He has a 78.00 pack-year smoking history. He has never used smokeless tobacco. He reports current alcohol use. He reports that he does not use drugs.   Family History:   family history includes Diabetes in his mother; Heart disease in his father.    Review of  Systems: Review of Systems  Constitutional: Negative.   HENT: Negative.    Respiratory: Negative.    Cardiovascular: Negative.   Gastrointestinal: Negative.   Musculoskeletal: Negative.        Leg weakness, sores on legs  Neurological: Negative.   Psychiatric/Behavioral: Negative.    All other systems reviewed and are negative.  PHYSICAL EXAM: VS:  BP 140/78 (BP Location: Left Arm, Patient Position: Sitting, Cuff Size: Normal)    Pulse 83    Ht 6\' 2"  (1.88 m)    Wt 212 lb 4 oz (96.3 kg)    SpO2 96%    BMI 27.25 kg/m  , BMI Body mass index is 27.25 kg/m. Constitutional:  oriented to person, place, and time. No distress.  HENT:  Head: Grossly normal Eyes:  no discharge. No scleral icterus.  Neck: No JVD, no carotid bruits  Cardiovascular: Regular rate and rhythm, no murmurs appreciated Pulmonary/Chest: Clear to auscultation bilaterally, no wheezes or rails Abdominal: Soft.  no distension.  no tenderness.  Musculoskeletal: Normal range of motion Neurological:  normal muscle tone. Coordination normal. No atrophy Skin: Skin warm and dry, healed sores on his legs Psychiatric: normal affect, pleasant   Recent Labs: 12/07/2020:  TSH 2.060 02/09/2021: B Natriuretic Peptide 470.8; Magnesium 2.1 02/10/2021: ALT 25 02/11/2021: Hemoglobin 12.6; Platelets 365 02/21/2021: BUN 46; Creatinine, Ser 2.34; Potassium 5.0; Sodium 136    Lipid Panel Lab Results  Component Value Date   CHOL 201 (H) 12/07/2020   HDL 37 (L) 12/07/2020   LDLCALC 100 (H) 12/07/2020   TRIG 381 (H) 12/07/2020    Wt Readings from Last 3 Encounters:  05/11/21 212 lb 4 oz (96.3 kg)  04/17/21 214 lb (97.1 kg)  04/10/21 224 lb (101.6 kg)     ASSESSMENT AND PLAN:  Problem List Items Addressed This Visit       Cardiology Problems   Coronary artery disease with history of myocardial infarction without history of CABG (Chronic)   Chronic systolic CHF (congestive heart failure) ([place]) - Primary   Essential  hypertension     Other   Diabetes mellitus with stage 4 chronic kidney disease GFR 15-29 (HCC) (Chronic)   Relevant Medications   insulin regular human CONCENTRATED (HUMULIN R U-500 KWIKPEN) 500 UNIT/ML KwikPen   Lymphedema   Diabetes (HCC)   Relevant Medications   insulin regular human CONCENTRATED (HUMULIN R U-500 KWIKPEN) 500 UNIT/ML KwikPen   Other Visit Diagnoses     Leg swelling       Aortic atherosclerosis (HCC)         CAD with stable angina Ejection fraction 50% Stressed importance of aggressive diabetes control Cholesterol improved but not at goal.  These numbers should get better as diabetes numbers get better No anginal symptoms  Acute on chronic diastolic CHF In the setting of renal failure Improved on torsemide 40 twice daily Recommend he try to cut her dose in the afternoon every other day Lab work followed by nephrology  Essential hypertension Blood pressure is well controlled on today's visit. No changes made to the medications. 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 Donnetta Hutching in creatinine to up to 3  Hyperlipidemia Continue Zetia Praluent Praluent 150 every 2 weeks Total cholesterol down to 200, may improve more with better diabetes control   Total encounter time more than 25 minutes  Greater than 50% was spent in counseling and coordination of care with the patient    Signed, Esmond Plants, M.D., Ph.D. [place], [place]

## 2021-05-11 NOTE — Patient Instructions (Addendum)
Medication Instructions:  No changes  If you need a refill on your cardiac medications before your next appointment, please call your pharmacy.    Lab work: No new labs needed   Testing/Procedures: No new testing needed   Follow-Up: At [facility], you and your health needs are our priority.  As part of our continuing mission to provide you with exceptional heart care, we have created designated Provider Care Teams.  These Care Teams include your primary Cardiologist (physician) and Advanced Practice Providers (APPs -  Physician Assistants and Nurse Practitioners) who all work together to provide you with the care you need, when you need it.  You will need a follow up appointment in 6 months  Providers on your designated Care Team:   Christopher Berge, NP Ryan Dunn, PA-C Cadence Furth, PA-C  COVID-19 Vaccine Information can be found at: [email or web address] For questions related to vaccine distribution or appointments, please email [email or web address] or call [phone].   
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
## 2021-05-15 ENCOUNTER — Other Ambulatory Visit: Payer: Self-pay

## 2021-05-15 ENCOUNTER — Encounter: Payer: Self-pay | Admitting: Family

## 2021-05-15 ENCOUNTER — Ambulatory Visit: Payer: Managed Care, Other (non HMO) | Attending: Family | Admitting: Family

## 2021-05-15 VITALS — BP 154/75 | HR 76 | Resp 18 | Ht 74.0 in | Wt 212.5 lb

## 2021-05-15 DIAGNOSIS — N184 Chronic kidney disease, stage 4 (severe): Secondary | ICD-10-CM

## 2021-05-15 DIAGNOSIS — Z87891 Personal history of nicotine dependence: Secondary | ICD-10-CM | POA: Diagnosis not present

## 2021-05-15 DIAGNOSIS — G4730 Sleep apnea, unspecified: Secondary | ICD-10-CM | POA: Insufficient documentation

## 2021-05-15 DIAGNOSIS — E1122 Type 2 diabetes mellitus with diabetic chronic kidney disease: Secondary | ICD-10-CM | POA: Insufficient documentation

## 2021-05-15 DIAGNOSIS — Z8616 Personal history of COVID-19: Secondary | ICD-10-CM | POA: Diagnosis not present

## 2021-05-15 DIAGNOSIS — E1140 Type 2 diabetes mellitus with diabetic neuropathy, unspecified: Secondary | ICD-10-CM | POA: Diagnosis not present

## 2021-05-15 DIAGNOSIS — I890 Lymphedema, not elsewhere classified: Secondary | ICD-10-CM

## 2021-05-15 DIAGNOSIS — I5032 Chronic diastolic (congestive) heart failure: Secondary | ICD-10-CM

## 2021-05-15 DIAGNOSIS — I5042 Chronic combined systolic (congestive) and diastolic (congestive) heart failure: Secondary | ICD-10-CM | POA: Diagnosis present

## 2021-05-15 DIAGNOSIS — I10 Essential (primary) hypertension: Secondary | ICD-10-CM | POA: Diagnosis not present

## 2021-05-15 DIAGNOSIS — E1151 Type 2 diabetes mellitus with diabetic peripheral angiopathy without gangrene: Secondary | ICD-10-CM | POA: Diagnosis not present

## 2021-05-15 DIAGNOSIS — E785 Hyperlipidemia, unspecified: Secondary | ICD-10-CM | POA: Insufficient documentation

## 2021-05-15 DIAGNOSIS — Z794 Long term (current) use of insulin: Secondary | ICD-10-CM | POA: Diagnosis not present

## 2021-05-15 DIAGNOSIS — I130 Hypertensive heart and chronic kidney disease with heart failure and stage 1 through stage 4 chronic kidney disease, or unspecified chronic kidney disease: Secondary | ICD-10-CM | POA: Insufficient documentation

## 2021-05-15 NOTE — Patient Instructions (Addendum)
Continue weighing daily and call for an overnight weight gain of 3 pounds or more or a weekly weight gain of more than 5 pounds.  ° ° ° °Call us in the future if you need us for anything °

## 2021-05-22 ENCOUNTER — Telehealth: Payer: Self-pay

## 2021-05-22 ENCOUNTER — Other Ambulatory Visit: Payer: Self-pay

## 2021-05-22 ENCOUNTER — Encounter: Payer: Self-pay | Admitting: Physician Assistant

## 2021-05-22 MED ORDER — CIPROFLOXACIN HCL 500 MG PO TABS: 500.0000 mg | ORAL_TABLET | Freq: Two times a day (BID) | ORAL | 0 refills | Status: DC

## 2021-05-22 NOTE — Telephone Encounter (Signed)
Pt's wife called and c/o pt having cloudy urine, burning and frequency with urination.  Pt was taken to [facility] for UTI but they had to leave before pt was seen due to her mother having to go to hospital.  Per Alyssa we sent Cipro 500 mg BID for 5 days.  Pt's wife notified

## 2021-05-23 ENCOUNTER — Telehealth: Payer: Self-pay

## 2021-05-24 ENCOUNTER — Encounter: Payer: Self-pay | Admitting: Internal Medicine

## 2021-05-25 NOTE — Telephone Encounter (Signed)
error 

## 2021-06-13 ENCOUNTER — Ambulatory Visit: Payer: Managed Care, Other (non HMO) | Admitting: Dermatology

## 2021-06-13 ENCOUNTER — Other Ambulatory Visit: Payer: Self-pay

## 2021-06-13 DIAGNOSIS — I872 Venous insufficiency (chronic) (peripheral): Secondary | ICD-10-CM

## 2021-06-13 DIAGNOSIS — R202 Paresthesia of skin: Secondary | ICD-10-CM

## 2021-06-13 MED ORDER — MOMETASONE FUROATE 0.1 % EX CREA: 1.0000 "application " | TOPICAL_CREAM | Freq: Every day | CUTANEOUS | 2 refills | Status: DC | PRN

## 2021-06-13 NOTE — Progress Notes (Signed)
° °  New Patient Visit  Subjective  [name] [name] is a 61 y.o. male who presents for the following: rash  (Lower legs, pt has severe neuropathy so doesn't know if itches) and check spot (Back, 2 yrs, itching, painful).  The following portions of the chart were reviewed this encounter and updated as appropriate:   Tobacco   Allergies   Meds   Problems   Med Hx   Surg Hx   Fam Hx      Review of Systems:  No other skin or systemic complaints except as noted in HPI or Assessment and Plan.  Objective  Well appearing patient in no apparent distress; mood and affect are within normal limits.  A focused examination was performed including lower legs, back. Relevant physical exam findings are noted in the Assessment and Plan.  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  Recommend daily graduated compression hose/stockings- easiest to put on first thing in morning, remove at bedtime.  Elevate legs as much as possible. Avoid salt/sodium rich foods.  Continue diuretic as directed Recommend graduated compression stockings qd Start Mometasone cream 3d/wk at bedtime to rough pink areas lower legs, top of feet  mometasone (ELOCON) 0.1 % cream - bil lower legs Apply 1 application topically daily as needed (Rash). Apply to rough pink areas lower legs, tops of feet and itchy patch of left upper back 3 nights a week  Notalgia paresthetica Left Upper Back  Notalgia paresthetica is a chronic condition affecting the skin of the back in which a pinched nerve along the spine causes itching or changes in sensation in an area of skin. This is usually accompanied by chronic rubbing or scratching.  There is no cure, but there are some treatments which may help control the itch.   Over the counter (non-prescription) treatments for notalgia paresthetica include numbing creams like pramoxine or lidocaine which temporarily reduce itch or Capsaicin-containing creams which cause a burning sensation but which sometimes over time will reset the nerves to stop producing itch.   If you choose to use Capsaicin cream, it is recommended to use it 5 times daily for 1 week followed by 3 times daily for 3-6 weeks. You may have to continue using it long-term. For severe cases, there are some prescription cream or pill options which may help.  Start Mometasone cr 3x/wk at bedtime to aa back until clear  Return in about 3 months (around 09/11/2021) for stasis derm, notalgia paresthetica.  I, Othelia Pulling, RMA, am acting as scribe for Sarina Ser, MD . Documentation: I have reviewed the above documentation for accuracy and completeness, and I agree with the above.  Sarina Ser, MD

## 2021-06-13 NOTE — Patient Instructions (Addendum)
If You Need Anything After Your Visit ° °If you have any questions or concerns for your doctor, please call our main line at [phone] and press option 4 to reach your doctor's medical assistant. If no one answers, please leave a voicemail as directed and we will return your call as soon as possible. Messages left after 4 pm will be answered the following business day.  ° °You may also send us a message via MyChart. We typically respond to MyChart messages within 1-2 business days. ° °For prescription refills, please ask your pharmacy to contact our office. Our fax number is [phone]. ° °If you have an urgent issue when the clinic is closed that cannot wait until the next business day, you can page your doctor at the number below.   ° °Please note that while we do our best to be available for urgent issues outside of office hours, we are not available 24/7.  ° °If you have an urgent issue and are unable to reach us, you may choose to seek medical care at your doctor's office, retail clinic, urgent care center, or emergency room. ° °If you have a medical emergency, please immediately call 911 or go to the emergency department. ° °Pager Numbers ° °- Dr. Kowalski: [phone] ° °- Dr. Moye: [phone] ° °- Dr. Stewart: [phone] ° °In the event of inclement weather, please call our main line at [phone] for an update on the status of any delays or closures. ° °Dermatology Medication Tips: °Please keep the boxes that topical medications come in in order to help keep track of the instructions about where and how to use these. Pharmacies typically print the medication instructions only on the boxes and not directly on the medication tubes.  ° °If your medication is too expensive, please contact our office at [phone] option 4 or send us a message through MyChart.  ° °We are unable to tell what your co-pay for medications will be in advance as this is different depending on your insurance coverage.  However, we may be able to find a substitute medication at lower cost or fill out paperwork to get insurance to cover a needed medication.  ° °If a prior authorization is required to get your medication covered by your insurance company, please allow us 1-2 business days to complete this process. ° °Drug prices often vary depending on where the prescription is filled and some pharmacies may offer cheaper prices. ° °The website www.goodrx.com contains coupons for medications through different pharmacies. The prices here do not account for what the cost may be with help from insurance (it may be cheaper with your insurance), but the website can give you the price if you did not use any insurance.  °- You can print the associated coupon and take it with your prescription to the pharmacy.  °- You may also stop by our office during regular business hours and pick up a GoodRx coupon card.  °- If you need your prescription sent electronically to a different pharmacy, notify our office through [facility] MyChart or by phone at [phone] option 4. ° ° ° ° °Si Usted Necesita Algo Después de Su Visita ° °También puede enviarnos un mensaje a través de MyChart. Por lo general respondemos a los mensajes de MyChart en el transcurso de 1 a 2 días hábiles. ° °Para renovar recetas, por favor pida a su farmacia que se ponga en contacto con nuestra oficina. Nuestro número de fax es el [phone]. ° °Si tiene   un asunto urgente cuando la clnica est cerrada y que no puede esperar hasta el siguiente da hbil, puede llamar/localizar a su doctor(a) al nmero que aparece a continuacin.   Por favor, tenga en cuenta que aunque hacemos todo lo posible para estar disponibles para asuntos urgentes fuera del horario de [place], no estamos disponibles las 24 horas del da, los 7 das de la [place].   Si tiene un problema urgente y no puede comunicarse con nosotros, puede optar por buscar atencin mdica  en el consultorio de su  doctor(a), en una clnica privada, en un centro de atencin urgente o en una sala de emergencias.  Si tiene Engineering geologist, por favor llame inmediatamente al 911 o vaya a la sala de emergencias.  Nmeros de bper  - Dr. Nehemiah Massed: [phone]  - Dra. Moye: [phone]  - Dra. Nicole Kindred: [phone]  En caso de inclemencias del [place], por favor llame a Johnsie Kindred principal al [phone] para una actualizacin sobre el [place] de cualquier retraso o cierre.  Consejos para la medicacin en dermatologa: Por favor, guarde las cajas en las que vienen los medicamentos de uso tpico para ayudarle a seguir las instrucciones sobre dnde y cmo usarlos. Las farmacias generalmente imprimen las instrucciones del medicamento slo en las cajas y no directamente en los tubos del [place].   Si su medicamento es muy caro, por favor, pngase en contacto con Zigmund Daniel llamando al [phone] y presione la opcin 4 o envenos un mensaje a travs de Pharmacist, community.   No podemos decirle cul ser su copago por los medicamentos por adelantado ya que esto es diferente dependiendo de la cobertura de su seguro. Sin embargo, es posible que podamos encontrar un medicamento sustituto a Electrical engineer un formulario para que el seguro cubra el medicamento que se considera necesario.   Si se requiere una autorizacin previa para que su compaa de seguros [place] su medicamento, por favor permtanos de 1 a 2 das hbiles para completar este proceso.  Los precios de los medicamentos varan con frecuencia dependiendo del Environmental consultant de dnde se surte la receta y alguna farmacias pueden ofrecer precios ms baratos.  El sitio web www.goodrx.com tiene cupones para medicamentos de Airline pilot. Los precios aqu no tienen en cuenta lo que podra costar con la ayuda del seguro (puede ser ms barato con su seguro), pero el sitio web puede darle el precio si no utiliz Research scientist (physical sciences).  - Puede imprimir el cupn  correspondiente y llevarlo con su receta a la farmacia.  - Tambin puede pasar por nuestra oficina durante el horario de atencin regular y Charity fundraiser una tarjeta de cupones de GoodRx.  - Si necesita que su receta se enve electrnicamente a una farmacia diferente, informe a nuestra oficina a travs de MyChart de  o por telfono llamando al [phone] y presione la opcin 4.  Notalgia paresthetica Over the counter (non-prescription) treatments for notalgia paresthetica include numbing creams like pramoxine or lidocaine which temporarily reduce itch or Capsaicin-containing creams which cause a burning sensation but which sometimes over time will reset the nerves to stop producing itch.   If you choose to use Capsaicin cream, it is recommended to use it 5 times daily for 1 week followed by 3 times daily for 3-6 weeks. You may have to continue using it long-term. For severe cases, there are some prescription cream or pill options which may help.

## 2021-06-15 NOTE — Progress Notes (Signed)
Appointment was cancelled.

## 2021-06-19 ENCOUNTER — Encounter: Payer: Self-pay | Admitting: Dermatology

## 2021-07-10 ENCOUNTER — Encounter: Payer: Self-pay | Admitting: Internal Medicine

## 2021-08-04 ENCOUNTER — Other Ambulatory Visit: Payer: Self-pay | Admitting: Physician Assistant

## 2021-08-20 ENCOUNTER — Ambulatory Visit: Payer: Managed Care, Other (non HMO) | Admitting: Cardiovascular Disease

## 2021-09-04 ENCOUNTER — Telehealth: Payer: Self-pay

## 2021-09-04 NOTE — Telephone Encounter (Signed)
Certification of disability property tax exclusion form signed by Alyssa and handed to patient. Copy of form placed in scan. ?

## 2021-09-09 ENCOUNTER — Encounter: Payer: Self-pay | Admitting: Internal Medicine

## 2021-09-12 ENCOUNTER — Ambulatory Visit: Payer: Managed Care, Other (non HMO) | Admitting: Dermatology

## 2021-09-12 DIAGNOSIS — L03115 Cellulitis of right lower limb: Secondary | ICD-10-CM

## 2021-09-12 DIAGNOSIS — R202 Paresthesia of skin: Secondary | ICD-10-CM

## 2021-09-12 DIAGNOSIS — L03116 Cellulitis of left lower limb: Secondary | ICD-10-CM | POA: Diagnosis not present

## 2021-09-12 DIAGNOSIS — I872 Venous insufficiency (chronic) (peripheral): Secondary | ICD-10-CM

## 2021-09-12 DIAGNOSIS — L03119 Cellulitis of unspecified part of limb: Secondary | ICD-10-CM

## 2021-09-12 MED ORDER — OPZELURA 1.5 % EX CREA: TOPICAL_CREAM | CUTANEOUS | 2 refills | Status: DC

## 2021-09-12 NOTE — Progress Notes (Signed)
? ?  Follow-Up Visit ?  ?Subjective  ?[name] [name] is a 61 y.o. male who presents for the following: Rash (3 months f/u on stasis dermatitis on the lower legs ) and Follow-up (Recheck itchy area on his back, treated with Mometasone cream with a poor response). ?Hx of spinal issues on his back  ? ?The following portions of the chart were reviewed this encounter and updated as appropriate:  ? Tobacco  Allergies  Meds  Problems  Med Hx  Surg Hx  Fam Hx   ?  ?Review of Systems:  No other skin or systemic complaints except as noted in HPI or Assessment and Plan. ? ?Objective  ?Well appearing patient in no apparent distress; mood and affect are within normal limits. ? ?A focused examination was performed including back,lower legs. Relevant physical exam findings are noted in the Assessment and Plan. ? ?left back ?Hyperpigmented macule  ? ?lower legs ?Erythematous, scaly patches involving the ankle and distal lower leg with associated lower leg edema.  ? ? ?Assessment & Plan  ?Notalgia paresthetica ?left back ?Chronic and persistent condition with duration or expected duration over one year. Condition is symptomatic / bothersome to patient. Not to goal. ? ?Notalgia paresthetica is a chronic condition affecting the skin of the back in which a pinched nerve along the spine causes itching or changes in sensation in an area of skin. This is usually accompanied by chronic rubbing or scratching. There is no cure, but there are some treatments which may help control the itch. ?  ?Over the counter (non-prescription) treatments for notalgia paresthetica include numbing creams like pramoxine or lidocaine which temporarily reduce itch or Capsaicin-containing creams which cause a burning sensation but which sometimes over time will reset the nerves to stop producing itch.  ? ?If you choose to use Capsaicin cream, it is recommended to use it 5 times daily for 1 week followed by 3 times daily for 3-6 weeks. You may have to continue  using it long-term. For severe cases, there are some prescription cream or pill options which may help.  ? ?Discussed Gabapentin cream, ILK or Botox may be a option in the future if no better  ? ?Stop Mometasone cream  ?Start Capsaicin cream daily  ? ?Related Medications ?Ruxolitinib Phosphate (OPZELURA) 1.5 % CREA ?Apply to skin daily, legs ? ?Stasis dermatitis of both legs - inflamed ?With cellulitis (no infection) ?lower legs ?Stasis in the legs causes chronic leg swelling, which may result in itchy or painful rashes, skin discoloration, skin texture changes, and sometimes ulceration.  Recommend daily graduated compression hose/stockings- easiest to put on first thing in morning, remove at bedtime.  Elevate legs as much as possible. Avoid salt/sodium rich foods.  ? ?Tacrolimus vs Elidel vs Eucrisa vs Opzelura cream  ? We will try Opzelura cream apply to legs daily  ? ?Related Medications ?mometasone (ELOCON) 0.1 % cream ?Apply 1 application topically daily as needed (Rash). Apply to rough pink areas lower legs, tops of feet and itchy patch of left upper back 3 nights a week ? ?Return in about 4 months (around 01/12/2022) for stasis dermatitis, Notalgia paresthetica. ? ?I, Marye Round, CMA, am acting as scribe for Sarina Ser, MD .  ?Documentation: I have reviewed the above documentation for accuracy and completeness, and I agree with the above. ? ?Sarina Ser, MD ? ?

## 2021-09-12 NOTE — Patient Instructions (Addendum)
Back  ?Notalgia paresthetica is a chronic condition affecting the skin of the back in which a pinched nerve along the spine causes itching or changes in sensation in an area of skin. This is usually accompanied by chronic rubbing or scratching. There is no cure, but there are some treatments which may help control the itch. ?  ?Over the counter (non-prescription) treatments for notalgia paresthetica include numbing creams like pramoxine or lidocaine which temporarily reduce itch or Capsaicin-containing creams which cause a burning sensation but which sometimes over time will reset the nerves to stop producing itch.  ? ?If you choose to use Capsaicin cream, it is recommended to use it 5 times daily for 1 week followed by 3 times daily for 3-6 weeks. You may have to continue using it long-term. For severe cases, there are some prescription cream or pill options which may help.  ? ? ? ? ?If You Need Anything After Your Visit ? ?If you have any questions or concerns for your doctor, please call our main line at [phone] and press option 4 to reach your doctor's medical assistant. If no one answers, please leave a voicemail as directed and we will return your call as soon as possible. Messages left after 4 pm will be answered the following business day.  ? ?You may also send [place] a message via MyChart. We typically respond to MyChart messages within 1-2 business days. ? ?For prescription refills, please ask your pharmacy to contact our office. Our fax number is [phone]. ? ?If you have an urgent issue when the clinic is closed that cannot wait until the next business day, you can page your doctor at the number below.   ? ?Please note that while we do our best to be available for urgent issues outside of office hours, we are not available 24/7.  ? ?If you have an urgent issue and are unable to reach [place], you may choose to seek medical care at your doctor's office, retail clinic, urgent care center, or emergency  room. ? ?If you have a medical emergency, please immediately call 911 or go to the emergency department. ? ?Pager Numbers ? ?- Dr. Nehemiah Massed: [phone] ? ?- Dr. Laurence Ferrari: [phone] ? ?- Dr. Nicole Kindred: [phone] ? ?In the event of inclement weather, please call our main line at [phone] for an update on the status of any delays or closures. ? ?Dermatology Medication Tips: ?Please keep the boxes that topical medications come in in order to help keep track of the instructions about where and how to use these. Pharmacies typically print the medication instructions only on the boxes and not directly on the medication tubes.  ? ?If your medication is too expensive, please contact our office at [phone] option 4 or send [place] a message through [place].  ? ?We are unable to tell what your co-pay for medications will be in advance as this is different depending on your insurance coverage. However, we may be able to find a substitute medication at lower cost or fill out paperwork to get insurance to cover a needed medication.  ? ?If a prior authorization is required to get your medication covered by your insurance company, please allow [place] 1-2 business days to complete this process. ? ?Drug prices often vary depending on where the prescription is filled and some pharmacies may offer cheaper prices. ? ?The website www.goodrx.com contains coupons for medications through different pharmacies. The prices here do not account for what the cost may be with help from  insurance (it may be cheaper with your insurance), but the website can give you the price if you did not use any insurance.  ?- You can print the associated coupon and take it with your prescription to the pharmacy.  ?- You may also stop by our office during regular business hours and pick up a GoodRx coupon card.  ?- If you need your prescription sent electronically to a different pharmacy, notify our office through [facility] or by phone at [phone]  option 4. ? ? ? ? ?Si Usted Necesita Algo Despu?s de Su Visita ? ?Tambi?n puede enviarnos un mensaje a trav?s de MyChart. Por lo general respondemos a los mensajes de MyChart en el transcurso de 1 a 2 d?as h?biles. ? ?Para renovar recetas, por favor pida a su farmacia que se ponga en contacto con nuestra oficina. Nuestro n?mero de fax es el [phone]. ? ?Si tiene un asunto urgente cuando la cl?nica est? cerrada y que no puede esperar hasta el siguiente d?a h?bil, puede llamar/localizar a su doctor(a) al n?mero que aparece a continuaci?n.  ? ?Por favor, tenga en cuenta que aunque hacemos todo lo posible para estar disponibles para asuntos urgentes fuera del horario de oficina, no estamos disponibles las 24 horas del d?a, los 7 d?as de la semana.  ? ?Si tiene un problema urgente y no puede comunicarse con nosotros, puede optar por buscar atenci?n m?dica  en el consultorio de su doctor(a), en una cl?nica privada, en un centro de atenci?n urgente o en una sala de emergencias. ? ?Si tiene Engineer, maintenance (IT) m?dica, por favor llame inmediatamente al 911 o vaya a la sala de emergencias. ? ?N?meros de b?per ? ?- Dr. Nehemiah Massed: [phone] ? ?- Dra. Moye: [phone] ? ?- Dra. Nicole Kindred: [phone] ? ?En caso de inclemencias del tiempo, por favor llame a nuestra l?nea principal al [phone] para una actualizaci?n sobre el estado de cualquier retraso o cierre. ? ?Consejos para la medicaci?n en dermatolog?a: ?Por favor, guarde las cajas en las que vienen los medicamentos de uso t?pico para ayudarle a seguir las instrucciones sobre d?nde y c?mo usarlos. Las farmacias generalmente imprimen las instrucciones del medicamento s?lo en las cajas y no directamente en los tubos del [place].  ? ?Si su medicamento es muy caro, por favor, p?ngase en contacto con Zigmund Daniel llamando al [phone] y presione la opci?n 4 o env?enos un mensaje a trav?s de MyChart.  ? ?No podemos decirle cu?l ser? su copago por los medicamentos  por adelantado ya que esto es diferente dependiendo de la cobertura de su seguro. Sin embargo, es posible que podamos encontrar un medicamento sustituto a Electrical engineer un formulario para que el seguro cubra el medicamento que se considera necesario.  ? ?Si se requiere Ardelia Mems autorizaci?n previa para que su compa??a de seguros [place] su medicamento, por favor perm?tanos de 1 a 2 d?as h?biles para completar este proceso. ? ?Los precios de los medicamentos var?an con frecuencia dependiendo del Environmental consultant de d?nde se surte la receta y alguna farmacias pueden ofrecer precios m?s baratos. ? ?El sitio web www.goodrx.com tiene cupones para medicamentos de Airline pilot. Los precios aqu? no tienen en cuenta lo que podr?a costar con la ayuda del seguro (puede ser m?s barato con su seguro), pero el sitio web puede darle el precio si no utiliz? ning?n seguro.  ?- Puede imprimir el cup?n correspondiente y llevarlo con su receta a la farmacia.  ?- Tambi?n puede pasar por nuestra oficina durante el horario de  atenci?n regular y recoger una tarjeta de cupones de GoodRx.  ?- Si necesita que su receta se env?e electr?nicamente a Chiropodist, informe a nuestra oficina a trav?s de MyChart de [place] o por tel?fono llamando al [phone] y presione la opci?n 4.  ?

## 2021-09-21 ENCOUNTER — Other Ambulatory Visit: Payer: Self-pay | Admitting: Internal Medicine

## 2021-09-21 ENCOUNTER — Encounter: Payer: Self-pay | Admitting: Dermatology

## 2021-09-25 ENCOUNTER — Telehealth: Payer: Self-pay

## 2021-09-25 NOTE — Telephone Encounter (Signed)
Wife called and advised that pt tried to commit suicide last night by taking a lot of pills.  Pt would not tell her what he had taken.  I spoke to a police officer that was at the pt's house this morning and he advised that he could not take patient to be direct admitted.  Pt had taken the medications last night and when the officer and EMS were at the house and spoke to patient, pt was coherent, able to talk, seemed to be no threat to himself or wife.  Officer Sharlet Salina advised that he talked to pt's wife and advised her of other means they can take to get patient help if needed.  Office also told wife that the guns in the house could be taken away from the premises but if brought to police station they wouldn't be able to keep patient from getting them back.  Tried to call wife back to advise her of the information I was given by the police officer but no answer and I LMOM for her to return my call ?

## 2021-10-15 ENCOUNTER — Ambulatory Visit (INDEPENDENT_AMBULATORY_CARE_PROVIDER_SITE_OTHER): Payer: Managed Care, Other (non HMO) | Admitting: Podiatry

## 2021-10-15 ENCOUNTER — Encounter: Payer: Self-pay | Admitting: Podiatry

## 2021-10-15 DIAGNOSIS — E1142 Type 2 diabetes mellitus with diabetic polyneuropathy: Secondary | ICD-10-CM | POA: Diagnosis not present

## 2021-10-15 DIAGNOSIS — B351 Tinea unguium: Secondary | ICD-10-CM | POA: Diagnosis not present

## 2021-10-15 DIAGNOSIS — Z794 Long term (current) use of insulin: Secondary | ICD-10-CM | POA: Diagnosis not present

## 2021-10-15 DIAGNOSIS — M79676 Pain in unspecified toe(s): Secondary | ICD-10-CM | POA: Diagnosis not present

## 2021-10-15 DIAGNOSIS — M21372 Foot drop, left foot: Secondary | ICD-10-CM

## 2021-10-15 NOTE — Progress Notes (Signed)
He presents today diabetic foot exam last A1c was at 10.3 states that he needs his nails trimmed he never obtained his dropfoot brace because he stays in his wheelchair most of the time and did not feel the need.  He also states that he had a little blister on his fifth toe from his shoes that he wears at home.  States that he feels like he is going to be going on dialysis in the next few days based on his lab work.  Objective: Vital signs are stable alert and oriented x3.  Pulses are barely palpable feet are red erythematous but cool to the touch.  Capillary fill time is immediate.  He has a healing wound to the dorsal aspect of the fourth toe right foot small blister to the fifth digit right foot.  Nails are slightly elongated.  Assessment: Diabetes mellitus uncontrolled severe diabetic peripheral neuropathy dropfoot left.  Nonambulatory.  Pain limb secondary to neuropathy and onychomycosis.  Plan: Debridement of toenails today.  Instructed him on how to take care of the small blister.  Instructed him not to debride if this apply small amount of Vaseline Aquaphor or Neosporin and a sock.

## 2021-10-17 ENCOUNTER — Ambulatory Visit: Payer: Managed Care, Other (non HMO) | Admitting: Surgery

## 2021-10-17 ENCOUNTER — Other Ambulatory Visit: Payer: Self-pay

## 2021-10-17 ENCOUNTER — Encounter: Payer: Self-pay | Admitting: Surgery

## 2021-10-17 ENCOUNTER — Telehealth: Payer: Self-pay | Admitting: Surgery

## 2021-10-17 VITALS — BP 96/61 | HR 69 | Temp 97.9°F | Ht 72.0 in | Wt 189.0 lb

## 2021-10-17 DIAGNOSIS — N184 Chronic kidney disease, stage 4 (severe): Secondary | ICD-10-CM | POA: Diagnosis not present

## 2021-10-17 DIAGNOSIS — E1122 Type 2 diabetes mellitus with diabetic chronic kidney disease: Secondary | ICD-10-CM | POA: Diagnosis not present

## 2021-10-17 MED ORDER — PANTOPRAZOLE SODIUM 40 MG PO TBEC
40.0000 mg | DELAYED_RELEASE_TABLET | Freq: Two times a day (BID) | ORAL | 1 refills | Status: DC
Start: 2021-10-17 — End: 2022-01-16

## 2021-10-17 NOTE — Telephone Encounter (Signed)
Patient has been advised of Pre-Admission date/time, COVID Testing date and Surgery date.  Surgery Date: 11/13/21 Preadmission Testing Date: 11/06/21 (phone 8a-1p) Covid Testing Date: Not needed.    Patient has been made aware to call [phone], between 1-3:00pm the day before surgery, to find out what time to arrive for surgery.

## 2021-10-17 NOTE — Patient Instructions (Addendum)
You have requested to have a peritoneal dialysis catheter placed. This will be done at [facility] with Dr. Dahlia Byes.  If you have FMLA or disability paperwork that needs filled out you may drop this off at our office or this can be faxed to [phone].  You will return after your post-op appointment with a lifting restriction for approximately 6 weeks total.  Please see the (blue)pre-care form that you have been given today. Our surgery scheduler will call you to verify surgery date and to go over information.   If you have any questions, please call our office.   Call your Kidney doctor about getting set up for training for the peritoneal dialysis catheter.     Peritoneal Dialysis Information Dialysis is a procedure that does some of the work healthy kidneys do. Peritoneal dialysis uses the thin lining of the belly (peritoneum) and a fluid called dialysate to remove wastes, salt, and extra water from the blood. Tell a health care provider about: Any allergies you have. All medicines you are taking. This includes vitamins, herbs, eye drops, creams, and over-the-counter medicines. Any problems you or family members have had with anesthetic medicines. Any blood disorders you have. Any surgeries you have. Any medical conditions you have. Whether you are pregnant or may be pregnant. What are the risks? Generally, this treatment is safe. But, problems may occur, including: Infection of the lining of the belly. Infection around the tube (catheter) that was inserted. Pain in the area where the tube was inserted. Weak muscles in the belly area. What happens before the procedure? Take steps to prevent infection. Your doctor will tell you what to do before treatment. You may have to: Put on a mask. Close doors and windows in your room. Wash your hands before and during treatment. Anyone who touches you or the machine must also wash hands often. Make sure that the machine and tubing  do not have germs (are sterile). Check the bag of dialysate. Make sure it is sealed and free of germs. What happens during the procedure?  At the start of each treatment, your belly will be filled with dialysate. The dialysate pulls waste, salt, and water through the lining of the belly. At the end of each treatment, the dialysate, salt, and water will be drained from your body. The treatment will be done using one of these methods: Continuous cycling peritoneal dialysis (CCPD). In this type, a machine fills and drains your belly while you sleep. Continuous ambulatory peritoneal dialysis (CAPD). This type is done up to 5 times a day. Each treatment takes about 30-40 minutes. You may do your normal activities between treatments. In some cases, both CCPD and CAPD are used. What can I expect after the procedure? Lab tests may be done to check how your treatment is working. Change the bandage (dressing) around your tube as told by your doctor. Keep the bandage clean and dry. Weigh yourself after the treatment and write down your weight. Follow these instructions at home: Eating and drinking Follow what your doctor tells you about eating and drinking. Your diet plan should include: Talking with a food expert (dietitian). Vitamin supplements. Good proteins. This includes meat, poultry, fish, and eggs. You may need to eat a high-protein diet. Preventing constipation Take steps to prevent problems when pooping (constipation): Eat foods that are high in fiber. This includes beans, whole grains, and fresh fruits and vegetables. Limit foods that are high in fat and sugars. This includes fried or sweet  foods. Be active. Go to the bathroom when you need to. Do not hold it in. Take medicines to treat this problem only if your doctor tells you to. General instructions Keep a strict schedule. The treatment must be done every day. Do not skip a day or a treatment. Make time for each treatment. Keep all  supplies in a cool, clean, and dry place. Take all medicines only as told by your doctor. Weigh yourself every day. Sudden weight gain may be a sign of a problem. Keep all follow-up visits. Where to find more information [employer] of Diabetes and Digestive and Kidney Diseases: [employer] National Kidney Foundation: www.kidney.org Contact a doctor if: You have a fever or chills. You vomit. You feel like you may vomit. You have watery poop (diarrhea). You have problems doing the treatment. Your blood pressure goes up. You gain weight in a short time. You feel short of breath all of a sudden. The tube in your belly seems loose or feels like it is coming out. The fluid from your belly is pinkish or reddish. Women who are having their period do not need to get help if the fluid is only a little pink or a little red. There are white strands in the dialysate. The strands can get stuck in your tubing. Get help right away if: The area around the tube in your belly swells or gets red, tender, or painful. There is pus coming from the area around the tube in your belly. The fluid from your belly is cloudy. You have more belly pain. Summary Peritoneal dialysis does some of the work healthy kidneys do. CAPD and CCPD are the two ways of doing dialysis. Your doctor will help you decide which type is best for you. This information is not intended to replace advice given to you by your health care provider. Make sure you discuss any questions you have with your health care provider. Document Revised: 12/30/2019 Document Reviewed: 12/30/2019 Elsevier Patient Education  [place].

## 2021-10-18 ENCOUNTER — Encounter: Payer: Self-pay | Admitting: Surgery

## 2021-10-18 NOTE — H&P (View-Only) (Signed)
Surgical Consultation  10/18/2021  [name] [name] is an 61 y.o. male.   Chief Complaint  Patient presents with   New Patient (Initial Visit)    PD catheter     HPI: Seen in consultation at the request of Dr. Candiss Norse for laparoscopic PD catheter placement.  He does have a significant history of diabetes, coronary artery disease CABG in 2018 sleep apnea decreased mobility peripheral vascular disease with lymphedema. CBC was normal and CMP shows a creatinine of 4.1, potassium of 4.2 and that the labs are normal.. He uses a walker and a scooter. Did very well from open heart surgery from cardiac perspective. He comes with his wife. Had a CT scan of the chest that I have personally reviewed showing no evidence of acute intra-abdominal pathology.  Also had a upper GI series with normal stomach and esophagus.  I have also reviewed personally the chest x-ray with no active cardiopulmonary disease. I have personally reviewed the records from Dr. Keturah Barre office. Prior abdominal operations  Past Medical History:  Diagnosis Date   Arthritis    CAD (coronary artery disease)    a. 12/2016 s/p CABG x 4 (LIMA->LAD, VG->RCA, VG->OM1, VG->D1); b. 02/2018 MV: EF 35%, small-med inferolaterlal infarct. No ischemia.   CKD (chronic kidney disease), stage IV (HCC)    Colon polyps    Diabetes ([place])    Dupre's syndrome    GERD (gastroesophageal reflux disease)    HFrEF (heart failure reduced ejection fraction) ([place])    a. 2018 EF 35%; b. 02/2018 EF 50%; c. 02/2019 EF 40-45%; d. 08/2019 Echo: EF 50-55%, no rwma, GrII DD; e. 04/2020 Echo: EF 30-35%, mod-sev glob HK. Mild LVH. GrII DD. Low-nl RV fxn. Mildly dil LA. Mild MR. Mild-mod Ao sclerosis w/o stenosis.   Hyperlipidemia    Hypertension    Ischemic cardiomyopathy    a. 02/2018 MV: EF 35%; b. 08/2019 Echo: EF 50-55%; c. 04/2020 Echo: EF 30-35%.   Lymphedema    Migraine    Neuropathy    PAD (peripheral artery disease) ([place])    a. 04/2017 Aortoiliac  duplex: R iliac dzs; b. 03/2020 LE Duplex: mild bilat atherosclerosis throughout. Patent vessels.   Retinopathy    Sleep apnea    Stomach ulcer     Past Surgical History:  Procedure Laterality Date   bone graft surgery     CARDIAC CATHETERIZATION  2013   S/p PCI    CARDIAC CATHETERIZATION  2018   S/p CABG   CARDIAC SURGERY     cataract surgery Bilateral    CORONARY ARTERY BYPASS GRAFT  2018   (LIMA-LAD,VG-RCA,VG-OM1,VG-D1)   ESOPHAGOGASTRODUODENOSCOPY (EGD) WITH PROPOFOL N/A 04/04/2020   Procedure: ESOPHAGOGASTRODUODENOSCOPY (EGD) WITH PROPOFOL;  Surgeon: Lucilla Lame, MD;  Location: [facility] ENDOSCOPY;  Service: Endoscopy;  Laterality: N/A;   ESOPHAGOGASTRODUODENOSCOPY (EGD) WITH PROPOFOL N/A 06/06/2020   Procedure: ESOPHAGOGASTRODUODENOSCOPY (EGD) WITH PROPOFOL;  Surgeon: Lucilla Lame, MD;  Location: [facility] ENDOSCOPY;  Service: Endoscopy;  Laterality: N/A;   EYE SURGERY     PERIPHERAL VASCULAR CATHETERIZATION Right 10/28/2016   PTA/DEB Right SFA   WRIST SURGERY      Family History  Problem Relation Age of Onset   Diabetes Mother    Heart disease Father     Social History:  reports that he has quit smoking. His smoking use included cigarettes. He has a 78.00 pack-year smoking history. He has never used smokeless tobacco. He reports current alcohol use. He reports that he does not use drugs.  Allergies:  Allergies  Allergen Reactions   Iodinated Contrast Media Other (See Comments)    Kidney disease  Kidney disease    Other Other (See Comments)    Pain  With joint stiffness     Statins Other (See Comments)    Pain  With joint stiffness  Pain  With joint stiffness    Pregabalin Other (See Comments)    Generalized aches and pains Generalized aches and pains Generalized aches and pains Generalized aches and pains Generalized aches and pains   Sacubitril-Valsartan Other (See Comments)    Hyperkalemia  Hyperkalemia   Atorvastatin Other (See Comments)    Muscle  aches Muscle aches   Pravastatin Other (See Comments)    Muscle aches Muscle aches   Rosuvastatin Other (See Comments)    Muscle Aches Other reaction(s): JOINT PAIN Other reaction(s): JOINT PAIN Muscle Aches     Medications reviewed.     ROS Full ROS performed and is otherwise negative other than what is stated in the HPI    BP 96/61   Pulse 69   Temp 97.9 F (36.6 C) (Oral)   Ht 6' (1.829 m)   Wt 189 lb (85.7 kg) Comment: unable to record  SpO2 97%   BMI 25.63 kg/m   Physical Exam Vitals and nursing note reviewed. Exam conducted with a chaperone present.  Constitutional:      General: He is not in acute distress.    Appearance: Normal appearance. He is not ill-appearing or toxic-appearing.  Eyes:     General: No scleral icterus.       Right eye: No discharge.        Left eye: No discharge.  Neck:     Vascular: No carotid bruit.  Cardiovascular:     Rate and Rhythm: Normal rate and regular rhythm.     Heart sounds: No murmur heard. Pulmonary:     Effort: Pulmonary effort is normal. No respiratory distress.     Breath sounds: Normal breath sounds. No stridor. No wheezing or rhonchi.  Abdominal:     General: Abdomen is flat. There is no distension.     Palpations: Abdomen is soft. There is no mass.     Tenderness: There is no abdominal tenderness. There is no guarding or rebound.     Hernia: No hernia is present.  Musculoskeletal:        General: No swelling or tenderness. Normal range of motion.     Cervical back: Normal range of motion and neck supple. No rigidity or tenderness.  Lymphadenopathy:     Cervical: No cervical adenopathy.  Skin:    General: Skin is warm and dry.     Capillary Refill: Capillary refill takes less than 2 seconds.  Neurological:     General: No focal deficit present.     Mental Status: He is alert and oriented to person, place, and time.  Psychiatric:        Mood and Affect: Mood normal.        Behavior: Behavior normal.         Thought Content: Thought content normal.        Judgment: Judgment normal.     Assessment/Plan: 61 year old male with advanced renal insufficiency and need for dialysis.  Discussed with the patient in detail about different modalities for dialysis hemo versus peritoneal dialysis.  He is very interested in pursuing peritoneal route as these will give him significant autonomy and he wishes to perform this at home. I Talked with  him in detail regarding the procedure.  Risk, benefits and possible implications including but not limited to: Bleeding, infection catheter malfunction, bowel injuries.  He understands and wishes to proceed. Mild diabetic perspective I do not think that we will get him to an ideal state given his longstanding diabetes.  He was also very honest about this. We will tentatively place him on the schedule in a few weeks. Please note I spent 55 minutes in this encounter including personally reviewing medical records, personally reviewing imaging studies, placing orders, counseling the patient and performing appropriate documentation.  A copy of this report was sent to the referring provider      Caroleen Hamman, MD [facility] General Surgeon

## 2021-10-18 NOTE — Progress Notes (Signed)
Surgical Consultation  10/18/2021  [name] [name] is an 61 y.o. male.   Chief Complaint  Patient presents with   New Patient (Initial Visit)    PD catheter     HPI: Seen in consultation at the request of Dr. Candiss Norse for laparoscopic PD catheter placement.  He does have a significant history of diabetes, coronary artery disease CABG in 2018 sleep apnea decreased mobility peripheral vascular disease with lymphedema. CBC was normal and CMP shows a creatinine of 4.1, potassium of 4.2 and that the labs are normal.. He uses a walker and a scooter. Did very well from open heart surgery from cardiac perspective. He comes with his wife. Had a CT scan of the chest that I have personally reviewed showing no evidence of acute intra-abdominal pathology.  Also had a upper GI series with normal stomach and esophagus.  I have also reviewed personally the chest x-ray with no active cardiopulmonary disease. I have personally reviewed the records from Dr. Keturah Barre office. Prior abdominal operations  Past Medical History:  Diagnosis Date   Arthritis    CAD (coronary artery disease)    a. 12/2016 s/p CABG x 4 (LIMA->LAD, VG->RCA, VG->OM1, VG->D1); b. 02/2018 MV: EF 35%, small-med inferolaterlal infarct. No ischemia.   CKD (chronic kidney disease), stage IV (HCC)    Colon polyps    Diabetes ([place])    Dupre's syndrome    GERD (gastroesophageal reflux disease)    HFrEF (heart failure reduced ejection fraction) ([place])    a. 2018 EF 35%; b. 02/2018 EF 50%; c. 02/2019 EF 40-45%; d. 08/2019 Echo: EF 50-55%, no rwma, GrII DD; e. 04/2020 Echo: EF 30-35%, mod-sev glob HK. Mild LVH. GrII DD. Low-nl RV fxn. Mildly dil LA. Mild MR. Mild-mod Ao sclerosis w/o stenosis.   Hyperlipidemia    Hypertension    Ischemic cardiomyopathy    a. 02/2018 MV: EF 35%; b. 08/2019 Echo: EF 50-55%; c. 04/2020 Echo: EF 30-35%.   Lymphedema    Migraine    Neuropathy    PAD (peripheral artery disease) ([place])    a. 04/2017 Aortoiliac  duplex: R iliac dzs; b. 03/2020 LE Duplex: mild bilat atherosclerosis throughout. Patent vessels.   Retinopathy    Sleep apnea    Stomach ulcer     Past Surgical History:  Procedure Laterality Date   bone graft surgery     CARDIAC CATHETERIZATION  2013   S/p PCI    CARDIAC CATHETERIZATION  2018   S/p CABG   CARDIAC SURGERY     cataract surgery Bilateral    CORONARY ARTERY BYPASS GRAFT  2018   (LIMA-LAD,VG-RCA,VG-OM1,VG-D1)   ESOPHAGOGASTRODUODENOSCOPY (EGD) WITH PROPOFOL N/A 04/04/2020   Procedure: ESOPHAGOGASTRODUODENOSCOPY (EGD) WITH PROPOFOL;  Surgeon: Lucilla Lame, MD;  Location: [facility] ENDOSCOPY;  Service: Endoscopy;  Laterality: N/A;   ESOPHAGOGASTRODUODENOSCOPY (EGD) WITH PROPOFOL N/A 06/06/2020   Procedure: ESOPHAGOGASTRODUODENOSCOPY (EGD) WITH PROPOFOL;  Surgeon: Lucilla Lame, MD;  Location: [facility] ENDOSCOPY;  Service: Endoscopy;  Laterality: N/A;   EYE SURGERY     PERIPHERAL VASCULAR CATHETERIZATION Right 10/28/2016   PTA/DEB Right SFA   WRIST SURGERY      Family History  Problem Relation Age of Onset   Diabetes Mother    Heart disease Father     Social History:  reports that he has quit smoking. His smoking use included cigarettes. He has a 78.00 pack-year smoking history. He has never used smokeless tobacco. He reports current alcohol use. He reports that he does not use drugs.  Allergies:  Allergies  Allergen Reactions   Iodinated Contrast Media Other (See Comments)    Kidney disease  Kidney disease    Other Other (See Comments)    Pain  With joint stiffness     Statins Other (See Comments)    Pain  With joint stiffness  Pain  With joint stiffness    Pregabalin Other (See Comments)    Generalized aches and pains Generalized aches and pains Generalized aches and pains Generalized aches and pains Generalized aches and pains   Sacubitril-Valsartan Other (See Comments)    Hyperkalemia  Hyperkalemia   Atorvastatin Other (See Comments)    Muscle  aches Muscle aches   Pravastatin Other (See Comments)    Muscle aches Muscle aches   Rosuvastatin Other (See Comments)    Muscle Aches Other reaction(s): JOINT PAIN Other reaction(s): JOINT PAIN Muscle Aches     Medications reviewed.     ROS Full ROS performed and is otherwise negative other than what is stated in the HPI    BP 96/61   Pulse 69   Temp 97.9 F (36.6 C) (Oral)   Ht 6' (1.829 m)   Wt 189 lb (85.7 kg) Comment: unable to record  SpO2 97%   BMI 25.63 kg/m   Physical Exam Vitals and nursing note reviewed. Exam conducted with a chaperone present.  Constitutional:      General: He is not in acute distress.    Appearance: Normal appearance. He is not ill-appearing or toxic-appearing.  Eyes:     General: No scleral icterus.       Right eye: No discharge.        Left eye: No discharge.  Neck:     Vascular: No carotid bruit.  Cardiovascular:     Rate and Rhythm: Normal rate and regular rhythm.     Heart sounds: No murmur heard. Pulmonary:     Effort: Pulmonary effort is normal. No respiratory distress.     Breath sounds: Normal breath sounds. No stridor. No wheezing or rhonchi.  Abdominal:     General: Abdomen is flat. There is no distension.     Palpations: Abdomen is soft. There is no mass.     Tenderness: There is no abdominal tenderness. There is no guarding or rebound.     Hernia: No hernia is present.  Musculoskeletal:        General: No swelling or tenderness. Normal range of motion.     Cervical back: Normal range of motion and neck supple. No rigidity or tenderness.  Lymphadenopathy:     Cervical: No cervical adenopathy.  Skin:    General: Skin is warm and dry.     Capillary Refill: Capillary refill takes less than 2 seconds.  Neurological:     General: No focal deficit present.     Mental Status: He is alert and oriented to person, place, and time.  Psychiatric:        Mood and Affect: Mood normal.        Behavior: Behavior normal.         Thought Content: Thought content normal.        Judgment: Judgment normal.     Assessment/Plan: 61 year old male with advanced renal insufficiency and need for dialysis.  Discussed with the patient in detail about different modalities for dialysis hemo versus peritoneal dialysis.  He is very interested in pursuing peritoneal route as these will give him significant autonomy and he wishes to perform this at home. I Talked with  him in detail regarding the procedure.  Risk, benefits and possible implications including but not limited to: Bleeding, infection catheter malfunction, bowel injuries.  He understands and wishes to proceed. Mild diabetic perspective I do not think that we will get him to an ideal state given his longstanding diabetes.  He was also very honest about this. We will tentatively place him on the schedule in a few weeks. Please note I spent 55 minutes in this encounter including personally reviewing medical records, personally reviewing imaging studies, placing orders, counseling the patient and performing appropriate documentation.  A copy of this report was sent to the referring provider      Caroleen Hamman, MD [facility] General Surgeon

## 2021-11-06 ENCOUNTER — Other Ambulatory Visit: Payer: Self-pay

## 2021-11-06 ENCOUNTER — Encounter
Admission: RE | Admit: 2021-11-06 | Discharge: 2021-11-06 | Disposition: A | Discharge status: Home / Self Care | Payer: Managed Care, Other (non HMO) | Source: Ambulatory Visit | Attending: Surgery | Admitting: Surgery

## 2021-11-06 ENCOUNTER — Telehealth: Payer: Self-pay | Admitting: *Deleted

## 2021-11-06 DIAGNOSIS — Z794 Long term (current) use of insulin: Secondary | ICD-10-CM

## 2021-11-06 DIAGNOSIS — Z01812 Encounter for preprocedural laboratory examination: Secondary | ICD-10-CM

## 2021-11-06 DIAGNOSIS — E876 Hypokalemia: Secondary | ICD-10-CM

## 2021-11-06 DIAGNOSIS — R7309 Other abnormal glucose: Secondary | ICD-10-CM

## 2021-11-06 DIAGNOSIS — E871 Hypo-osmolality and hyponatremia: Secondary | ICD-10-CM

## 2021-11-06 DIAGNOSIS — N189 Chronic kidney disease, unspecified: Secondary | ICD-10-CM

## 2021-11-06 DIAGNOSIS — E118 Type 2 diabetes mellitus with unspecified complications: Secondary | ICD-10-CM

## 2021-11-06 DIAGNOSIS — N179 Acute kidney failure, unspecified: Secondary | ICD-10-CM

## 2021-11-06 HISTORY — DX: Chronic obstructive pulmonary disease, unspecified: J44.9

## 2021-11-06 HISTORY — DX: Anemia, unspecified: D64.9

## 2021-11-06 HISTORY — DX: Myoneural disorder, unspecified: G70.9

## 2021-11-06 HISTORY — DX: Pneumonia, unspecified organism: J18.9

## 2021-11-06 HISTORY — DX: Acute myocardial infarction, unspecified: I21.9

## 2021-11-06 NOTE — Telephone Encounter (Signed)
Request for pre-operative cardiac clearance Received: Today Karen Kitchens, NP  P Cv Div Preop Callback Request for pre-operative cardiac clearance:     1. What type of surgery is being performed?  LAPAROSCOPIC INSERTION CONTINUOUS AMBULATORY PERITONEAL DIALYSIS  (CAPD) CATHETER   2. When is this surgery scheduled?  11/13/2021     3. Type of clearance being requested (medical, pharmacy, both).  BOTH     4. Are there any medications that need to be held prior to surgery?  ASA   5. Practice name and name of physician performing surgery?  Performing surgeon: Dr. Caroleen Hamman, MD  Requesting clearance: Honor Loh, FNP-C       6. Anesthesia type (none, local, MAC, general)?  GENERAL   7. What is the office phone and fax number?    Phone: [phone]  Fax: [phone]

## 2021-11-06 NOTE — Patient Instructions (Addendum)
Your procedure is scheduled on: 11/13/21 - Tuesday Report to the Registration Desk on the 1st floor of the [facility]. To find out your arrival time, please call [phone] between 1PM - 3PM on:  11/12/21 - Monday If your arrival time is 6:00 am, do not arrive prior to that time as the [facility] entrance doors do not open until 6:00 am.  REMEMBER: Instructions that are not followed completely may result in serious medical risk, up to and including death; or upon the discretion of your surgeon and anesthesiologist your surgery may need to be rescheduled.  Do not eat food after midnight the night before surgery.  No gum chewing, lozengers or hard candies.  You may however, drink CLEAR liquids up to 2 hours before you are scheduled to arrive for your surgery. Do not drink anything within 2 hours of your scheduled arrival time. Type 1 and Type 2 diabetics should only drink water.  TAKE ONLY THESE MEDICATIONS THE MORNING OF SURGERY WITH A SIP OF WATER:  - carvedilol (COREG) 12.5 MG tablet - ezetimibe (ZETIA) 10 MG tablet - isosorbide mononitrate (IMDUR) 30 MG 24 hr tablet - pantoprazole (PROTONIX) 40 MG tablet, (take one the night before and one on the morning of surgery - helps to prevent nausea after surgery.)    - TRESIBA FLEXTOUCH 200 UNIT/ML FlexTouch Pen - Only administer 1/2 of your prescribed dose on Insulin on the evening before your procedure.  - NOVOLOG FLEXPEN 100 UNIT/ML FlexPen - None on the morning of surgery, may resume with meals.  Follow recommendations from Cardiologist, Pulmonologist or PCP regarding stopping Aspirin, Coumadin, Plavix, Eliquis, Pradaxa, or Pletal. Stop taking your Aspirin 81 mg 5 days prior to your procedure, beginning 11/08/21.  One week prior to surgery: Stop Anti-inflammatories (NSAIDS) such as Advil, Aleve, Ibuprofen, Motrin, Naproxen, Naprosyn and Aspirin based products such as Excedrin, Goodys Powder, BC Powder.  Stop ANY OVER THE COUNTER  supplements until after surgery.  You may however, continue to take Tylenol if needed for pain up until the day of surgery.  No Alcohol for 24 hours before or after surgery.  No Smoking including e-cigarettes for 24 hours prior to surgery.  No chewable tobacco products for at least 6 hours prior to surgery.  No nicotine patches on the day of surgery.  Do not use any "recreational" drugs for at least a week prior to your surgery.  Please be advised that the combination of cocaine and anesthesia may have negative outcomes, up to and including death. If you test positive for cocaine, your surgery will be cancelled.  On the morning of surgery brush your teeth with toothpaste and water, you may rinse your mouth with mouthwash if you wish. Do not swallow any toothpaste or mouthwash.  Use CHG Soap or wipes as directed on instruction sheet.  Do not wear jewelry, make-up, hairpins, clips or nail polish.  Do not wear lotions, powders, or perfumes.   Do not shave body from the neck down 48 hours prior to surgery just in case you cut yourself which could leave a site for infection.  Also, freshly shaved skin may become irritated if using the CHG soap.  Contact lenses, hearing aids and dentures may not be worn into surgery.  Do not bring valuables to the hospital. [facility] is not responsible for any missing/lost belongings or valuables.   Notify your doctor if there is any change in your medical condition (cold, fever, infection).  Wear comfortable clothing (specific  to your surgery type) to the hospital.  After surgery, you can help prevent lung complications by doing breathing exercises.  Take deep breaths and cough every 1-2 hours. Your doctor may order a device called an Incentive Spirometer to help you take deep breaths. When coughing or sneezing, hold a pillow firmly against your incision with both hands. This is called "splinting." Doing this helps protect your incision. It also  decreases belly discomfort.  If you are being admitted to the hospital overnight, leave your suitcase in the car. After surgery it may be brought to your room.  If you are being discharged the day of surgery, you will not be allowed to drive home. You will need a responsible adult (18 years or older) to drive you home and stay with you that night.   If you are taking public transportation, you will need to have a responsible adult (18 years or older) with you. Please confirm with your physician that it is acceptable to use public transportation.   Please call the [facility] at [phone] if you have any questions about these instructions.  Surgery Visitation Policy:  Patients undergoing a surgery or procedure may have two family members or support persons with them as long as the person is not COVID-19 positive or experiencing its symptoms.   Inpatient Visitation:    Visiting hours are 7 a.m. to 8 p.m. Up to four visitors are allowed at one time in a patient room, including children. The visitors may rotate out with other people during the day. One designated support person (adult) may remain overnight.

## 2021-11-06 NOTE — Telephone Encounter (Signed)
Patient has an appointment with Dr. Rockey Situ on 11/09/2021 at which time preoperative clearance will be addressed.

## 2021-11-07 ENCOUNTER — Telehealth: Payer: Self-pay

## 2021-11-07 NOTE — Telephone Encounter (Signed)
Error

## 2021-11-08 NOTE — Progress Notes (Signed)
Cardiology Office Note  Date:  11/09/2021   ID:  [name] [name], DOB [birth year], MRN [id number]  PCP:  [name], MD   Chief Complaint  Patient presents with   Cardiac clearance     "Doing well." Patient is scheduled for a Procedure Laterality Anesthesia; LAPAROSCOPIC INSERTION CONTINUOUS AMBULATORY PERITONEAL DIALYSIS. Medications reviewed by the patient verbally.            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), HTN,  HLD,  GERD Former smoker quit 2013 Sleep apnea, unable to tolerate mask Statin intolerance Chronic renal insuff secondary to poorly controlled diabetes Who presents for routine follow-up of his coronary artery disease, history of bypass  LOV 01/2021 In follow-up today reports he is doing relatively well Uses a walker to ambulate secondary to leg weakness Reports leg swelling much improved Taking torsemide 40 in the Am with 20 to 40 in the pm  Lab work reviewed A1c 10.3, followed [facility] endocrine [facility] Last creatinine available 2.34 down from 3.98 in September 2022 Getting a monitoring device for glucose  Scheduled for laparoscopic insertion peritoneal dialysis catheter Nephrology following  Recent testing reviewed Echo 9/22 Left ventricular ejection fraction, by estimation, is 50 to 55%.  He denies shortness of breath on exertion, no angina  Total chol on no medications 318 in sept 2019 Numbers much better on Praluent and Zetia down to 200  Bp better controlled  Poor sleep, CPA and bipap did not work, Working with Dr. Humphrey Rolls  Normal sinus rhythm rate 70 bpm consider old inferior MI, ST-T wave abnormality V5, V6 1 and aVL, consider lateral ischemia, seen previously  Covid: in the hospital 12/2020 hypoxia, cough, weakness, shortness of breath, COVID-positive, elevated troponin  Echo 04/2020 EF 30 to 35%  Medication  intolerances On farxiga/jardiance gets "pancreatitis" Stopped hydralazine on his own, "intolerance"  Echocardiogram 08/2019  normal LV function, no evidence of valve disease or elevated right heart pressures  Outside notes from [facility]  initial ejection fraction 35% presumably in 2018 Ejection fraction 50% in October 2019 Notes indicating ejection fraction 40 to 45% October 2020 [place] Myoview 2019 showing small to moderate inferolateral infarction without ischemia  Notes indicate previously unable to increase Entresto secondary to high potassium level  CABG details from [place] LIMA to the LAD Vein graft to the RCA Vein graft OM1 Vein graft to D1  Statin myalgias, rheumatoid issue  PMH:   has a past medical history of Anemia, Arthritis, CAD (coronary artery disease), CHF (congestive heart failure) ([place]), CKD (chronic kidney disease), stage IV ([place]), Colon polyps, COPD (chronic obstructive pulmonary disease) ([place]), Diabetes ([place]), Dupre's syndrome, GERD (gastroesophageal reflux disease), HFrEF (heart failure reduced ejection fraction) ([place]), Hyperlipidemia, Hypertension, Ischemic cardiomyopathy, Lymphedema, Migraine, Myocardial infarction ([place]), Neuromuscular disorder ([place]), Neuropathy, PAD (peripheral artery disease) ([place]), Pneumonia, Retinopathy, Sleep apnea, and Stomach ulcer.  PSH:    Past Surgical History:  Procedure Laterality Date   bone graft surgery Bilateral    on both feet x 2   CARDIAC CATHETERIZATION  2013   S/p PCI    CARDIAC CATHETERIZATION  2018   S/p CABG   CARDIAC SURGERY     cataract surgery Bilateral    CORONARY ARTERY BYPASS GRAFT  2018   (LIMA-LAD,VG-RCA,VG-OM1,VG-D1)   ESOPHAGOGASTRODUODENOSCOPY (EGD) WITH PROPOFOL N/A 04/04/2020   Procedure: ESOPHAGOGASTRODUODENOSCOPY (EGD) WITH PROPOFOL;  Surgeon: Lucilla Lame, MD;  Location: [facility] ENDOSCOPY;  Service: Endoscopy;  Laterality: N/A;   ESOPHAGOGASTRODUODENOSCOPY (EGD) WITH PROPOFOL N/A  06/06/2020   Procedure: ESOPHAGOGASTRODUODENOSCOPY (EGD) WITH PROPOFOL;  Surgeon: Lucilla Lame, MD;  Location: [facility] ENDOSCOPY;  Service: Endoscopy;  Laterality: N/A;   EYE SURGERY     PERIPHERAL VASCULAR CATHETERIZATION Right 10/28/2016   PTA/DEB Right SFA   WRIST SURGERY Left    cyst    Current Outpatient Medications  Medication Sig Dispense Refill   albuterol (VENTOLIN HFA) 108 (90 Base) MCG/ACT inhaler SMARTSIG:1 Inhalation By Mouth Every 4-6 Hours PRN (Patient not taking: Reported on 11/06/2021)     Alirocumab (PRALUENT) 150 MG/ML SOAJ Inject 1 pen into the skin every 14 (fourteen) days. (Patient not taking: Reported on 11/06/2021) 2 mL 11   aspirin EC 81 MG tablet Take 81 mg by mouth daily. Swallow whole.     carvedilol (COREG) 12.5 MG tablet TAKE 18.5 MG  (1 1/2 PILL) TWICE A DAY 225 tablet 3   cholecalciferol (VITAMIN D3) 25 MCG (1000 UNIT) tablet Take 2,000 Units by mouth daily.     ezetimibe (ZETIA) 10 MG tablet Take 1 tablet (10 mg total) by mouth daily. 90 tablet 3   Insulin Pen Needle (B-D ULTRAFINE III SHORT PEN) 31G X 8 MM MISC To use with pen basal and mealtime coverage insulin pens 5 times daily. DX: E11.65 500 each 1   isosorbide mononitrate (IMDUR) 30 MG 24 hr tablet Take 1 tablet (30 mg total) by mouth 2 (two) times daily. 180 tablet 3   losartan (COZAAR) 25 MG tablet Take 25 mg by mouth daily.     metoCLOPramide (REGLAN) 5 MG tablet One tab before each meal and one at bed time for dyspepsia prn 120 tablet 2   nortriptyline (PAMELOR) 10 MG capsule Take 50 mg by mouth at bedtime.     NOVOLOG FLEXPEN 100 UNIT/ML FlexPen Inject into the skin. 3 x 's daily with meals - SSI     ofloxacin (OCUFLOX) 0.3 % ophthalmic solution Place into the left eye. (Patient not taking: Reported on 11/06/2021)     pantoprazole (PROTONIX) 40 MG tablet Take 1 tablet (40 mg total) by mouth 2 (two) times daily before a meal. 180 tablet 1   Ruxolitinib Phosphate (OPZELURA) 1.5 % CREA Apply to skin daily,  legs 60 g 2   torsemide (DEMADEX) 20 MG tablet Take 40 mg daily (two tabs), may take additional 40 mg (two tabs) in the evening for swelling to lower legs 200 tablet 3   TRESIBA FLEXTOUCH 200 UNIT/ML FlexTouch Pen QHS     No current facility-administered medications for this visit.    Allergies:   Iodinated contrast media, Other, Statins, Pregabalin, Sacubitril-valsartan, Atorvastatin, Pravastatin, and Rosuvastatin   Social History:  The patient  reports that he has been smoking cigarettes. He has a 9.75 pack-year smoking history. He has never used smokeless tobacco. He reports current alcohol use. He reports that he does not use drugs.   Family History:   family history includes Diabetes in his mother; Heart disease in his father.    Review of Systems: Review of Systems  Constitutional: Negative.   HENT: Negative.    Respiratory: Negative.    Cardiovascular: Negative.   Gastrointestinal: Negative.   Musculoskeletal: Negative.        Leg weakness, sores on legs  Neurological: Negative.   Psychiatric/Behavioral: Negative.    All other systems reviewed and are negative.   PHYSICAL EXAM: VS:  BP 120/64 (BP Location:  Left Arm, Patient Position: Sitting, Cuff Size: Normal)   Pulse 70   Ht 6\' 2"  (1.88 m)   Wt 206 lb 2 oz (93.5 kg)   SpO2 96%   BMI 26.46 kg/m  , BMI Body mass index is 26.46 kg/m. Constitutional:  oriented to person, place, and time. No distress.  HENT:  Head: Grossly normal Eyes:  no discharge. No scleral icterus.  Neck: No JVD, no carotid bruits  Cardiovascular: Regular rate and rhythm, no murmurs appreciated, no edema Pulmonary/Chest: Clear to auscultation bilaterally, no wheezes or rails Abdominal: Soft.  no distension.  no tenderness.  Musculoskeletal: Normal range of motion Neurological:  normal muscle tone. Coordination normal. No atrophy Skin: Skin warm and dry open sores on the legs Psychiatric: normal affect, pleasant   Recent Labs: 12/07/2020:  TSH 2.060 02/09/2021: B Natriuretic Peptide 470.8; Magnesium 2.1 02/10/2021: ALT 25 02/11/2021: Hemoglobin 12.6; Platelets 365 02/21/2021: BUN 46; Creatinine, Ser 2.34; Potassium 5.0; Sodium 136    Lipid Panel Lab Results  Component Value Date   CHOL 201 (H) 12/07/2020   HDL 37 (L) 12/07/2020   LDLCALC 100 (H) 12/07/2020   TRIG 381 (H) 12/07/2020    Wt Readings from Last 3 Encounters:  11/09/21 206 lb 2 oz (93.5 kg)  10/17/21 189 lb (85.7 kg)  05/15/21 212 lb 8 oz (96.4 kg)     ASSESSMENT AND PLAN:  Problem List Items Addressed This Visit       Cardiology Problems   Coronary artery disease with history of myocardial infarction without history of CABG (Chronic)   Relevant Orders   EKG 67-ELFY   Chronic systolic CHF (congestive heart failure) ([place])   Essential hypertension   Relevant Orders   EKG 12-Lead     Other   Diabetes mellitus with stage 4 chronic kidney disease GFR 15-29 (HCC) (Chronic)   Relevant Orders   EKG 12-Lead   Lymphedema   Diabetes ([place])   Other Visit Diagnoses     Chronic diastolic heart failure ([place])    -  Primary   Relevant Orders   EKG 12-Lead   Leg swelling       Aortic atherosclerosis (HCC)       Coronary artery disease of native artery of native heart with stable angina pectoris (HCC)         Preop cardiovascular evaluation Acceptable risk for peritoneal dialysis catheter placement, no further testing  CAD with stable angina Ejection fraction 50% Stressed importance of aggressive diabetes control Continue Zetia with Praluent No further cardiac testing needed at this time  Acute on chronic diastolic CHF In the setting of renal failure Continue current dose of torsemide 40 in the morning 20 up to 40 in the afternoon as needed  Essential hypertension Blood pressure is well controlled on today's visit. No changes made to the medications.  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 Donnetta Hutching in creatinine to up to 3 now trending downward Plan for peritoneal dialysis catheter placement  Hyperlipidemia Continue Zetia Praluent Praluent 150 every 2 weeks Total cholesterol down to 200   Total encounter time more than 30 minutes  Greater than 50% was spent in counseling and coordination of care with  the patient    Signed, Esmond Plants, M.D., Ph.D. [place], [place]

## 2021-11-09 ENCOUNTER — Encounter: Payer: Self-pay | Admitting: Cardiovascular Disease

## 2021-11-09 ENCOUNTER — Encounter
Admission: RE | Admit: 2021-11-09 | Discharge: 2021-11-09 | Disposition: A | Discharge status: Home / Self Care | Payer: Managed Care, Other (non HMO) | Source: Ambulatory Visit | Attending: Surgery | Admitting: Surgery

## 2021-11-09 ENCOUNTER — Ambulatory Visit: Payer: Managed Care, Other (non HMO) | Admitting: Cardiovascular Disease

## 2021-11-09 VITALS — BP 120/64 | HR 70 | Ht 74.0 in | Wt 206.1 lb

## 2021-11-09 DIAGNOSIS — Z01818 Encounter for other preprocedural examination: Secondary | ICD-10-CM | POA: Insufficient documentation

## 2021-11-09 DIAGNOSIS — E876 Hypokalemia: Secondary | ICD-10-CM | POA: Diagnosis not present

## 2021-11-09 DIAGNOSIS — N179 Acute kidney failure, unspecified: Secondary | ICD-10-CM

## 2021-11-09 DIAGNOSIS — E1122 Type 2 diabetes mellitus with diabetic chronic kidney disease: Secondary | ICD-10-CM | POA: Diagnosis not present

## 2021-11-09 DIAGNOSIS — I252 Old myocardial infarction: Secondary | ICD-10-CM

## 2021-11-09 DIAGNOSIS — E871 Hypo-osmolality and hyponatremia: Secondary | ICD-10-CM | POA: Diagnosis not present

## 2021-11-09 DIAGNOSIS — I2510 Atherosclerotic heart disease of native coronary artery without angina pectoris: Secondary | ICD-10-CM | POA: Diagnosis not present

## 2021-11-09 DIAGNOSIS — I700 Atherosclerosis of aorta: Secondary | ICD-10-CM

## 2021-11-09 DIAGNOSIS — M7989 Other specified soft tissue disorders: Secondary | ICD-10-CM

## 2021-11-09 DIAGNOSIS — I5032 Chronic diastolic (congestive) heart failure: Secondary | ICD-10-CM | POA: Diagnosis not present

## 2021-11-09 DIAGNOSIS — I890 Lymphedema, not elsewhere classified: Secondary | ICD-10-CM | POA: Diagnosis not present

## 2021-11-09 DIAGNOSIS — I10 Essential (primary) hypertension: Secondary | ICD-10-CM

## 2021-11-09 DIAGNOSIS — Z794 Long term (current) use of insulin: Secondary | ICD-10-CM

## 2021-11-09 DIAGNOSIS — N184 Chronic kidney disease, stage 4 (severe): Secondary | ICD-10-CM

## 2021-11-09 DIAGNOSIS — Z01812 Encounter for preprocedural laboratory examination: Secondary | ICD-10-CM

## 2021-11-09 DIAGNOSIS — I25118 Atherosclerotic heart disease of native coronary artery with other forms of angina pectoris: Secondary | ICD-10-CM

## 2021-11-09 DIAGNOSIS — N189 Chronic kidney disease, unspecified: Secondary | ICD-10-CM | POA: Diagnosis not present

## 2021-11-09 DIAGNOSIS — I5022 Chronic systolic (congestive) heart failure: Secondary | ICD-10-CM

## 2021-11-09 LAB — BASIC METABOLIC PANEL
Anion gap: 11 (ref 5–15)
BUN: 57 mg/dL — ABNORMAL HIGH (ref 8–23)
CO2: 25 mmol/L (ref 22–32)
Calcium: 8.9 mg/dL (ref 8.9–10.3)
Chloride: 101 mmol/L (ref 98–111)
Creatinine, Ser: 3.65 mg/dL — ABNORMAL HIGH (ref 0.61–1.24)
GFR, Estimated: 18 mL/min — ABNORMAL LOW (ref >60)
Glucose, Bld: 256 mg/dL — ABNORMAL HIGH (ref 70–99)
Potassium: 4.1 mmol/L (ref 3.5–5.1)
Sodium: 137 mmol/L (ref 135–145)

## 2021-11-09 LAB — APTT: aPTT: 28 seconds (ref 24–36)

## 2021-11-09 LAB — PROTIME-INR
INR: 1 (ref 0.8–1.2)
Prothrombin Time: 13.5 seconds (ref 11.4–15.2)

## 2021-11-09 MED ORDER — PRALUENT 150 MG/ML ~~LOC~~ SOAJ: 1.0000 "pen " | SUBCUTANEOUS | 11 refills | Status: DC

## 2021-11-09 NOTE — Patient Instructions (Addendum)
Medication Instructions:  No changes  If you need a refill on your cardiac medications before your next appointment, please call your pharmacy.    Lab work: No new labs needed   Testing/Procedures: No new testing needed   Follow-Up: At [facility], you and your health needs are our priority.  As part of our continuing mission to provide you with exceptional heart care, we have created designated Provider Care Teams.  These Care Teams include your primary Cardiologist (physician) and Advanced Practice Providers (APPs -  Physician Assistants and Nurse Practitioners) who all work together to provide you with the care you need, when you need it.  You will need a follow up appointment in 6 months  Providers on your designated Care Team:   Christopher Berge, NP Ryan Dunn, PA-C Cadence Furth, PA-C  COVID-19 Vaccine Information can be found at: [email or web address] For questions related to vaccine distribution or appointments, please email [email or web address] or call [phone].   

## 2021-11-09 NOTE — Progress Notes (Incomplete)
Perioperative Services  Pre-Admission/Anesthesia Testing Clinical Review  Date: 11/09/21  Patient Demographics:  Name: [name] [name] DOB:   May 18, 1961 MRN:   [id number]  Planned Surgical Procedure(s):    Case: [id number] Date/Time: 11/13/21 1352   Procedure: LAPAROSCOPIC INSERTION CONTINUOUS AMBULATORY PERITONEAL DIALYSIS  (CAPD) CATHETER   Anesthesia type: General   Pre-op diagnosis: ESRD   Location: [facility] OR ROOM 04 / [facility]   Surgeons: Jules Husbands, MD     NOTE: Available PAT nursing documentation and vital signs have been reviewed. Clinical nursing staff has updated patient's PMH/PSHx, current medication list, and drug allergies/intolerances to ensure comprehensive history available to assist in medical decision making as it pertains to the aforementioned surgical procedure and anticipated anesthetic course. Extensive review of available clinical information performed. [name] [name] PMH and PSHx updated with any diagnoses/procedures that  may have been inadvertently omitted during his intake with the pre-admission testing department's nursing staff.  Clinical Discussion:  [name] [name] is a 61 y.o. male who is submitted for pre-surgical anesthesia review and clearance prior to him undergoing the above procedure. Patient has never been a smoker/***Patient is a {Smoking Status:21012044} (*** pack years; quit ***). Pertinent PMH includes: ***  Patient is followed by cardiology (***, MD). He was last seen in the cardiology clinic on ***; notes reviewed. ***  ***Patient with a PMH significant for ***. CHA2DS2-VASc Score = ***. Patient chronically anticoagulated using ***; compliant with therapy with no evidence of GI bleeding. Last TTE performed on *** revealed normal left ventricular systolic function with an EF of ***%.  Subsequent myocardial perfusion imaging study performed on ***revealed no evidence of stress-induced myocardial ischemia or arrhythmia (see full  interpretation of cardiovascular testing below). Patient on GDMT for his HTN and HLD diagnoses.  Blood pressure well controlled at *** on currently prescribed *** therapies.  Patient is on a statin for his HLD. T2DM *** controlled on currently prescribed regimen; Hgb A1c ***% when last checked on ***. Functional capacity, as defined by [facility], is documented as being >/= 4 METS.  No changes were made to patient's medication regimen.  Patient to follow-up with outpatient cardiology in *** months or sooner if needed.  [name] [name] is scheduled for an LAPAROSCOPIC INSERTION CONTINUOUS AMBULATORY PERITONEAL DIALYSIS  (CAPD) CATHETER on *** with Dr. Marland Kitchen  Given patient's past medical history significant for cardiovascular diagnoses, presurgical cardiac clearance was sought by the performing surgeon's office and ***PAT team. ***.  This patient {ACTION; IS/IS SPQ:33007622} on daily ***anticoagulation ***antiplatelet therapy. He has been instructed on recommendations for holding his *** for {NUMBER 1-10:22536} days prior to his procedure with plans to restart as soon as postoperative bleeding risk felt to be minimized by his attending surgeon. The patient has been instructed that his last dose of his anticoagulant will be on ***.  Patient {Actions; denies-reports:120008} previous perioperative complications with anesthesia in the past. In review of the available records, it is noted that patient underwent a *** anesthetic course here at [facility] ([facility] {Roman numeral i-vi:30201}) in *** without documented complications.      11/09/2021    1:54 PM 10/17/2021   10:31 AM 05/15/2021   10:29 AM  Vitals with BMI  Height 6\' 2"  6\' 0"  6\' 2"   Weight 206 lbs 2 oz 189 lbs 212 lbs 8 oz  BMI 26.45 63.33 54.56  Systolic 256 96 389  Diastolic 64 61 75  Pulse 70 69 76    Providers/Specialists:  NOTE: Primary physician provider listed below. Patient may have been seen by APP or partner  within same practice.   PROVIDER ROLE / SPECIALTY LAST Suszanne Finch, MD *** (Surgeon)  ***  [name], MD Primary Care Provider  ***  *** Cardiology  ***  *** ***  ***   Allergies:  Iodinated contrast media, Other, Statins, Pregabalin, Sacubitril-valsartan, Atorvastatin, Pravastatin, and Rosuvastatin  Current Home Medications:   No current facility-administered medications for this encounter.    albuterol (VENTOLIN HFA) 108 (90 Base) MCG/ACT inhaler   Alirocumab (PRALUENT) 150 MG/ML SOAJ   aspirin EC 81 MG tablet   carvedilol (COREG) 12.5 MG tablet   cholecalciferol (VITAMIN D3) 25 MCG (1000 UNIT) tablet   ezetimibe (ZETIA) 10 MG tablet   Insulin Pen Needle (B-D ULTRAFINE III SHORT PEN) 31G X 8 MM MISC   isosorbide mononitrate (IMDUR) 30 MG 24 hr tablet   losartan (COZAAR) 25 MG tablet   metoCLOPramide (REGLAN) 5 MG tablet   nortriptyline (PAMELOR) 10 MG capsule   NOVOLOG FLEXPEN 100 UNIT/ML FlexPen   ofloxacin (OCUFLOX) 0.3 % ophthalmic solution   pantoprazole (PROTONIX) 40 MG tablet   Ruxolitinib Phosphate (OPZELURA) 1.5 % CREA   torsemide (DEMADEX) 20 MG tablet   TRESIBA FLEXTOUCH 200 UNIT/ML FlexTouch Pen   History:   Past Medical History:  Diagnosis Date   Anemia    Arthritis    CAD (coronary artery disease)    a. 12/2016 s/p CABG x 4 (LIMA->LAD, VG->RCA, VG->OM1, VG->D1); b. 02/2018 MV: EF 35%, small-med inferolaterlal infarct. No ischemia.   CHF (congestive heart failure) (HCC)    CKD (chronic kidney disease), stage IV (HCC)    Colon polyps    COPD (chronic obstructive pulmonary disease) (HCC)    mild   Diabetes ([name])    Dupre's syndrome    GERD (gastroesophageal reflux disease)    HFrEF (heart failure reduced ejection fraction) ([place])    a. 2018 EF 35%; b. 02/2018 EF 50%; c. 02/2019 EF 40-45%; d. 08/2019 Echo: EF 50-55%, no rwma, GrII DD; e. 04/2020 Echo: EF 30-35%, mod-sev glob HK. Mild LVH. GrII DD. Low-nl RV fxn. Mildly dil LA. Mild MR. Mild-mod Ao  sclerosis w/o stenosis.   Hyperlipidemia    Hypertension    Ischemic cardiomyopathy    a. 02/2018 MV: EF 35%; b. 08/2019 Echo: EF 50-55%; c. 04/2020 Echo: EF 30-35%.   Lymphedema    Migraine    Myocardial infarction [facility])    Neuromuscular disorder (HCC)    both legs and feet   Neuropathy    PAD (peripheral artery disease) ([place])    a. 04/2017 Aortoiliac duplex: R iliac dzs; b. 03/2020 LE Duplex: mild bilat atherosclerosis throughout. Patent vessels.   Pneumonia    Retinopathy    Sleep apnea    no cpap or bipap   Stomach ulcer    Past Surgical History:  Procedure Laterality Date   bone graft surgery Bilateral    on both feet x 2   CARDIAC CATHETERIZATION  2013   S/p PCI    CARDIAC CATHETERIZATION  2018   S/p CABG   CARDIAC SURGERY     cataract surgery Bilateral    CORONARY ARTERY BYPASS GRAFT  2018   (LIMA-LAD,VG-RCA,VG-OM1,VG-D1)   ESOPHAGOGASTRODUODENOSCOPY (EGD) WITH PROPOFOL N/A 04/04/2020   Procedure: ESOPHAGOGASTRODUODENOSCOPY (EGD) WITH PROPOFOL;  Surgeon: Lucilla Lame, MD;  Location: [facility] ENDOSCOPY;  Service: Endoscopy;  Laterality: N/A;   ESOPHAGOGASTRODUODENOSCOPY (EGD) WITH PROPOFOL N/A 06/06/2020  Procedure: ESOPHAGOGASTRODUODENOSCOPY (EGD) WITH PROPOFOL;  Surgeon: Lucilla Lame, MD;  Location: [facility] ENDOSCOPY;  Service: Endoscopy;  Laterality: N/A;   EYE SURGERY     PERIPHERAL VASCULAR CATHETERIZATION Right 10/28/2016   PTA/DEB Right SFA   WRIST SURGERY Left    cyst   Family History  Problem Relation Age of Onset   Diabetes Mother    Heart disease Father    Social History   Tobacco Use   Smoking status: Every Day    Packs/day: 0.25    Years: 39.00    Total pack years: 9.75    Types: Cigarettes   Smokeless tobacco: Never  Vaping Use   Vaping Use: Never used  Substance Use Topics   Alcohol use: Yes    Comment: very rarely - wine   Drug use: Never    Pertinent Clinical Results:  LABS: {CHL AN LABS REVIEWED:112001::"Labs reviewed: Acceptable for  surgery."}  No visits with results within 3 Day(s) from this visit.  Latest known visit with results is:  Office Visit on 02/21/2021  Component Date Value Ref Range Status   Sodium 02/21/2021 136  135 - 145 mmol/L Final   Potassium 02/21/2021 5.0  3.5 - 5.1 mmol/L Final   Chloride 02/21/2021 105  98 - 111 mmol/L Final   CO2 02/21/2021 24  22 - 32 mmol/L Final   Glucose, Bld 02/21/2021 206 (H)  70 - 99 mg/dL Final   Glucose reference range applies only to samples taken after fasting for at least 8 hours.   BUN 02/21/2021 46 (H)  8 - 23 mg/dL Final   Creatinine, Ser 02/21/2021 2.34 (H)  0.61 - 1.24 mg/dL Final   Calcium 02/21/2021 8.4 (L)  8.9 - 10.3 mg/dL Final   GFR, Estimated 02/21/2021 31 (L)  >60 mL/min Final   Comment: (NOTE) Calculated using the CKD-EPI Creatinine Equation (2021)    Anion gap 02/21/2021 7  5 - 15 Final   Performed at [facility], [address] [place], [place] [address]    ECG: Date: 11/09/2021 Time ECG obtained: *** {Time; am/pm:31393} Rate: *** bpm Rhythm: {CHL RHYTHM BASELINE EKG FOR HCW:23762831} Axis (leads I and aVF): {Left-right-normal:60277::"***"} Normal (up/up).Marland KitchenMarland KitchenLEFT axis (up/down)...RIGHT axis (down/up).Marland KitchenMarland KitchenEXTREME axis (down/down) Intervals: PR *** ms. QRS *** ms. QTc *** ms. ST segment and T wave changes: No evidence of acute ST segment elevation or depression Comparison: Similar to previous tracing obtained on *** No previous tracings available for review and comparison.   IMAGING / PROCEDURES: ***  Impression and Plan:  Birney Belshe has been referred for pre-anesthesia review and clearance prior to him undergoing the planned anesthetic and procedural courses. Available labs, pertinent testing, and imaging results were personally reviewed by me. This patient has been appropriately cleared by cardiology with an overall *** risk of significant perioperative cardiovascular complications.  Based on clinical review performed today  (11/09/21), barring any significant acute changes in the patient's overall condition, it is anticipated that he will be able to proceed with the planned surgical intervention. Any acute changes in clinical condition may necessitate his procedure being postponed and/or cancelled. Patient will meet with anesthesia team (MD and/or CRNA) on the day of his procedure for preoperative evaluation/assessment. Questions regarding anesthetic course will be fielded at that time.   Pre-surgical instructions were reviewed with the patient during his PAT appointment and questions were fielded by PAT clinical staff. Patient was advised that if any questions or concerns arise prior to his procedure then he should return a  call to PAT and/or his surgeon's office to discuss.  Honor Loh, MSN, APRN, FNP-C, CEN [facility]  Peri-operative Services Nurse Practitioner Phone: [phone] Fax: [phone] 11/09/21 2:09 PM  NOTE: This note has been prepared using Dragon dictation software. Despite my best ability to proofread, there is always the potential that unintentional transcriptional errors may still occur from this process.

## 2021-11-12 ENCOUNTER — Encounter: Payer: Self-pay | Admitting: Surgery

## 2021-11-12 MED ORDER — ORAL CARE MOUTH RINSE: 15.0000 mL | Freq: Once | OROMUCOSAL | Status: AC

## 2021-11-12 MED ORDER — CHLORHEXIDINE GLUCONATE 0.12 % MT SOLN: 15.0000 mL | Freq: Once | OROMUCOSAL | Status: AC

## 2021-11-12 MED ORDER — CEFAZOLIN SODIUM-DEXTROSE 2-4 GM/100ML-% IV SOLN
2.0000 g | INTRAVENOUS | Status: AC
  Administered 2021-11-13: 2 g via INTRAVENOUS

## 2021-11-12 MED ORDER — ACETAMINOPHEN 500 MG PO TABS: 1000.0000 mg | ORAL_TABLET | ORAL | Status: DC

## 2021-11-12 MED ORDER — SODIUM CHLORIDE 0.9 % IV SOLN: INTRAVENOUS | Status: DC

## 2021-11-12 MED ORDER — CHLORHEXIDINE GLUCONATE CLOTH 2 % EX PADS
6.0000 | MEDICATED_PAD | Freq: Once | CUTANEOUS | Status: AC
  Administered 2021-11-13: 6 via TOPICAL

## 2021-11-12 MED ORDER — ACETAMINOPHEN 500 MG PO TABS: 1000.0000 mg | ORAL_TABLET | Freq: Once | ORAL | Status: AC

## 2021-11-12 MED ORDER — GABAPENTIN 300 MG PO CAPS: 300.0000 mg | ORAL_CAPSULE | ORAL | Status: AC

## 2021-11-12 NOTE — Progress Notes (Signed)
Perioperative Services  Pre-Admission/Anesthesia Testing Clinical Review  Date: 11/12/21  Patient Demographics:  Name: [name] [name] DOB:   Jul 04, 1960 MRN:   [id number]  Planned Surgical Procedure(s):    Case: [id number] Date/Time: 11/13/21 1352   Procedure: LAPAROSCOPIC INSERTION CONTINUOUS AMBULATORY PERITONEAL DIALYSIS  (CAPD) CATHETER   Anesthesia type: General   Pre-op diagnosis: ESRD   Location: [facility] OR ROOM 04 / [facility]S FOR ANEST[facility]IA [facility]   Surgeons: Jules Husbands, MD   NOTE: Available PAT nursing documentation and vital signs have been reviewed. Clinical nursing staff has updated patient's PMH/PSHx, current medication list, and drug allergies/intolerances to ensure comprehensive history available to assist in medical decision making as it pertains to the aforementioned surgical procedure and anticipated anesthetic course. Extensive review of available clinical information performed.  PMH and PSHx updated with any diagnoses/procedures that  may have been inadvertently omitted during his intake with the pre-admission testing department's nursing staff.  Clinical Discussion:  [name] [name] is a 61 y.o. male who is submitted for pre-surgical anesthesia review and clearance prior to him undergoing the above procedure. Patient is a Current Smoker (9.75 pack years). Pertinent PMH includes: CAD (s/p CABG), MI, ischemic cardiomyopathy, HFrEF, PAD, HTN, HLD, T2DM, CKD-IV, GERD (on daily PPI), gastric ulcer, OSAH (does not require nocturnal PAP therapy), COPD, anemia, neuropathy, lymphedema.  Patient is followed by cardiology Rockey Situ, MD). He was last seen in the cardiology clinic on 11/09/2021; notes reviewed.  At the time of this clinic visit, patient doing well overall from a cardiovascular perspective.  He denied any episodes of chest pain, shortness of breath, PND, orthopnea, palpitations, significant peripheral edema, vertiginous symptoms, or presyncope/syncope.  Patient  with a past medical history significant for cardiovascular diagnoses.  Of note, patient has received care outside of the state of [place], therefore complete records regarding cardiovascular history not available for review at time of consult.  Information gathered from patient and notes from local cardiologist.  Patient reported to have suffered an MI in the past; details unknown.  Patient underwent a four-vessel CABG in 12/2016.  LIMA-LAD, SVG-RCA, SVG-OM1, and SVG-D1 bypass grafts were placed.  Given patient's history of and ischemic cardiomyopathy and associated HFrEF, left ventricular function has been serially monitored.  Last TTE was performed on 02/09/2021 revealing a normal left ventricular systolic function with an EF of 50-55%.  There was septal wall hypokinesis noted. Diastolic Doppler parameters consistent with pseudonormalization (G2DD).  Left atrium was mildly dilated.  No valvular regurgitation noted.  There was no evidence of a significant transvalvular gradient to suggest stenosis.  Patient with a known history of PAD.  Last vascular ABI/TBI study was done on 04/10/2021 revealing no significant RIGHT lower extremity disease with only mild disease noted contralaterally.  Blood pressure well controlled at 120/64 on currently prescribed beta-blocker, nitrate, ARB, and diuretic therapies.  Patient jointly managed in the [place] heart failure clinic by [facility], NP-C. Patient is statin intolerant, therefore he is being treated with ezetimibe + PCSK9i (alirocumab) for his HLD and further ASCVD prevention.  T2DM poorly controlled on currently prescribed regimen; last HgbA1c was 10.3% when checked on 09/12/2021.  Patient has an OSAH diagnosis, however he reported that nocturnal PAP therapy "did not work for him".  Functional capacity, as defined by DASI, is documented as being >/= 4 METS.  No changes were made to his medication regimen.  Patient to follow-up with outpatient  cardiology in 6 months or sooner if needed.  [name] [name] is scheduled for  an LAPAROSCOPIC INSERTION CONTINUOUS AMBULATORY PERITONEAL DIALYSIS  (CAPD) CATHETER on 11/13/2021 with Dr. Caroleen Hamman, MD. Given patient's past medical history significant for cardiovascular diagnoses, presurgical cardiac clearance was sought by the PAT team. Per cardiology, "based ACC/AHA guidelines, the patient's past medical history, and the amount of time since his last clinic visit, this patient would be at an overall ACCEPTABLE risk for the planned procedure without further cardiovascular testing or intervention at this time". In review of his medication reconciliation, it is noted the patient is on daily antiplatelet therapy. He has been instructed on recommendations from his cardiologist for holding his daily low-dose ASA for 5 days prior to his procedure with plans to restart as soon as postoperative bleeding risk felt to be minimized by his primary attending surgeon. The patient is aware that his last dose of ASA should be on 11/07/2021.  Patient denies previous perioperative complications with anesthesia in the past. In review of the available records, it is noted that patient underwent a general anesthetic course here at [facility] (ASA III) in 05/2020 without documented complications.      11/09/2021    1:54 PM 10/17/2021   10:31 AM 05/15/2021   10:29 AM  Vitals with BMI  Height 6\' 2"  6\' 0"  6\' 2"   Weight 206 lbs 2 oz 189 lbs 212 lbs 8 oz  BMI 26.45 55.73 22.02  Systolic 542 96 706  Diastolic 64 61 75  Pulse 70 69 76    Providers/Specialists:   NOTE: Primary physician provider listed below. Patient may have been seen by APP or partner within same practice.   PROVIDER ROLE / SPECIALTY LAST Suszanne Finch, MD General Surgery (Surgeon) 10/17/2021  [name], MD Primary Care Provider 04/17/2021  Ida Rogue, MD Cardiology 11/09/2021  Darylene Price, NP-C Heart  Failure 05/15/2021  Murlean Iba, MD Nephrology 10/16/2021  Jennings Books, MD Neurology 11/02/2021   Allergies:  Iodinated contrast media, Other, Statins, Pregabalin, Sacubitril-valsartan, Atorvastatin, Pravastatin, and Rosuvastatin  Current Home Medications:   No current facility-administered medications for this encounter.    albuterol (VENTOLIN HFA) 108 (90 Base) MCG/ACT inhaler   Alirocumab (PRALUENT) 150 MG/ML SOAJ   aspirin EC 81 MG tablet   carvedilol (COREG) 12.5 MG tablet   cholecalciferol (VITAMIN D3) 25 MCG (1000 UNIT) tablet   ezetimibe (ZETIA) 10 MG tablet   Insulin Pen Needle (B-D ULTRAFINE III SHORT PEN) 31G X 8 MM MISC   isosorbide mononitrate (IMDUR) 30 MG 24 hr tablet   losartan (COZAAR) 25 MG tablet   metoCLOPramide (REGLAN) 5 MG tablet   nortriptyline (PAMELOR) 10 MG capsule   NOVOLOG FLEXPEN 100 UNIT/ML FlexPen   ofloxacin (OCUFLOX) 0.3 % ophthalmic solution   pantoprazole (PROTONIX) 40 MG tablet   Ruxolitinib Phosphate (OPZELURA) 1.5 % CREA   torsemide (DEMADEX) 20 MG tablet   TRESIBA FLEXTOUCH 200 UNIT/ML FlexTouch Pen   History:   Past Medical History:  Diagnosis Date   Anemia    Arthritis    CAD (coronary artery disease)    a. 12/2016 s/p CABG x 4 (LIMA->LAD, VG->RCA, VG->OM1, VG->D1); b. 02/2018 MV: EF 35%, small-med inferolaterlal infarct. No ischemia.   CKD (chronic kidney disease), stage IV (HCC)    Colon polyps    COPD (chronic obstructive pulmonary disease) (HCC)    Dupre's syndrome    GERD (gastroesophageal reflux disease)    HFrEF (heart failure reduced ejection fraction) ([place])    a.) 2018 EF 35%; b.)  02/2018 EF 50%; c). 02/2019 EF 40-45%; d.) 08/2019 Echo: EF 50-55%, no rwma, GrII DD; e.) 04/2020 Echo: EF 30-35%, mod-sev glob HK. Mild LVH. G2DD. Low-nl RV fxn. Mildly dil LA. Mild MR. Mild-mod Ao sclerosis w/o stenosis; f.) TTE 02/09/2021: EF 55%; septal HK, LAE; G2DD.   History of 2019 novel coronavirus disease (COVID-19) 02/08/2021    Hyperlipidemia    Hypertension    Ischemic cardiomyopathy    a.) 02/2018 MV: EF 35%; b.) 08/2019 Echo: EF 50-55%; c.) 04/2020 Echo: EF 30-35%; d.) TTE 02/09/2021: EF 50-55%.   Lymphedema    Migraine    Myocardial infarction [facility])    Neuropathy    PAD (peripheral artery disease) ([place])    a. 04/2017 Aortoiliac duplex: R iliac dzs; b. 03/2020 LE Duplex: mild bilat atherosclerosis throughout. Patent vessels.   Pneumonia    Retinopathy    S/P CABG x 4    a.)  4v CABG: LIMA->LAD, SVG->RCA, SVG->OM1, SVG->D1   Sleep apnea    a.) does not require nocturnal PAP therapy   Statin intolerance    Stomach ulcer    T2DM (type 2 diabetes mellitus) ([place])    Past Surgical History:  Procedure Laterality Date   bone graft surgery Bilateral    on both feet x 2   CARDIAC CATHETERIZATION  2013   S/p PCI    CARDIAC CATHETERIZATION  2018   S/p CABG   CATARACT EXTRACTION Bilateral    CORONARY ARTERY BYPASS GRAFT  2018   (LIMA-LAD,VG-RCA,VG-OM1,VG-D1)   ESOPHAGOGASTRODUODENOSCOPY (EGD) WITH PROPOFOL N/A 04/04/2020   Procedure: ESOPHAGOGASTRODUODENOSCOPY (EGD) WITH PROPOFOL;  Surgeon: Lucilla Lame, MD;  Location: [facility] ENDOSCOPY;  Service: Endoscopy;  Laterality: N/A;   ESOPHAGOGASTRODUODENOSCOPY (EGD) WITH PROPOFOL N/A 06/06/2020   Procedure: ESOPHAGOGASTRODUODENOSCOPY (EGD) WITH PROPOFOL;  Surgeon: Lucilla Lame, MD;  Location: [facility] ENDOSCOPY;  Service: Endoscopy;  Laterality: N/A;   PERIPHERAL VASCULAR CATHETERIZATION Right 10/28/2016   PTA/DEB Right SFA   WRIST SURGERY Left    cyst   Family History  Problem Relation Age of Onset   Diabetes Mother    Heart disease Father    Social History   Tobacco Use   Smoking status: Every Day    Packs/day: 0.25    Years: 39.00    Total pack years: 9.75    Types: Cigarettes   Smokeless tobacco: Never  Vaping Use   Vaping Use: Never used  Substance Use Topics   Alcohol use: Yes    Comment: very rarely - wine   Drug use: Never    Pertinent Clinical  Results:  LABS: Labs reviewed: Acceptable for surgery.  Component Date Value Ref Range Status   Prothrombin Time 11/09/2021 13.5  11.4 - 15.2 seconds Final   INR 11/09/2021 1.0  0.8 - 1.2 Final   Comment: (NOTE) INR goal varies based on device and disease states. Performed at [facility], [address] [place], [place] [address]    aPTT 11/09/2021 28  24 - 36 seconds Final   Performed at [facility], [place], [place] [address]   Sodium 11/09/2021 137  135 - 145 mmol/L Final   Potassium 11/09/2021 4.1  3.5 - 5.1 mmol/L Final   Chloride 11/09/2021 101  98 - 111 mmol/L Final   CO2 11/09/2021 25  22 - 32 mmol/L Final   Glucose, Bld 11/09/2021 256 (H)  70 - 99 mg/dL Final   Glucose reference range applies only to samples taken after fasting for at least 8  hours.   BUN 11/09/2021 57 (H)  8 - 23 mg/dL Final   Creatinine, Ser 11/09/2021 3.65 (H)  0.61 - 1.24 mg/dL Final   Calcium 11/09/2021 8.9  8.9 - 10.3 mg/dL Final   GFR, Estimated 11/09/2021 18 (L)  >60 mL/min Final   Comment: (NOTE) Calculated using the CKD-EPI Creatinine Equation (2021)    Anion gap 11/09/2021 11  5 - 15 Final   Performed at [facility], [address] [place], [place] [address]    Ref Range & Units 10/10/2021  WBC 3.8 - 10.8 Thousand/uL 9.5   RBC 4.20 - 5.80 Million/uL 4.52   Hemoglobin 13.2 - 17.1 g/dL 14.6   Hematocrit 38.5 - 50.0 % 43.3   MCV 80.0 - 100.0 fL 95.8   MCH 27.0 - 33.0 pg 32.3   MCHC 32.0 - 36.0 g/dL 33.7   RDW 11.0 - 15.0 % 13.8   Platelets 140 - 400 Thousand/uL 306   MPV 7.5 - 12.5 fL 9.8   Neutrophils Absolute 1500 - 7800 cells/uL 5,928   Band Neutrophils Absolute, Manual Count 0 - 750 cells/uL CANCELED   Metamyelocytes Absolute 0 cells/uL CANCELED   Absolute Myelocytes 0 cells/uL CANCELED   Absolute Promyelocytes 0 cells/uL CANCELED   Lymphocytes Absolute 850 - 3900 cells/uL 2,461   Monocytes Absolute 200 - 950 cells/uL 684    Eosinophils Absolute 15 - 500 cells/uL 295   Basophils Absolute 0 - 200 cells/uL 133   Blasts Absolute 0 cells/uL CANCELED   NRBC Absolute 0 cells/uL CANCELED   Neutrophils Relative % 62.4   Bands Absolute % CANCELED   Metamyelocytes Percent % CANCELED   Myelocytes Relative % CANCELED   Promyelocytes Relative % CANCELED   Lymphocytes % 25.9   Variant lymphocytes/100 WBC (Bld) 0 - 10 % CANCELED   Monocytes % 7.2   Eosinophils % 3.1   Basophils Relative % 1.4   Blasts % CANCELED   nRBC 0 /100 WBC CANCELED   Comment(s)  CANCELED   Resulting Agency  [facility]    ECG: Date: 11/09/2021 Time ECG obtained: 1400 PM Rate: 70 bpm Rhythm: normal sinus Axis (leads I and aVF): Normal Intervals: PR 172 ms. QRS 110 ms. QTc 451 ms. ST segment and T wave changes: Lateral ST and T wave abnormalities.  Evidence of an age undetermined inferior infarct present.   Comparison: Similar to previous tracing obtained on 03/13/2021   IMAGING / PROCEDURES: VAS [place] ABI WITH/WO TBI performed on 04/10/2021 Right: Resting right ankle-brachial index is within normal range. No evidence of significant right lower extremity arterial disease. The right toe-brachial index is mildly abnormal.  Left: Resting left ankle-brachial index indicates mild left lower extremity arterial disease. The left toe-brachial index is mildly abnormal.   TRANSTHORACIC ECHOCARDIOGRAM performed on 02/09/2021 Left ventricular ejection fraction, by estimation, is 50 to 55%. The left ventricle has low normal function. The left ventricle demonstrates regional wall motion abnormalities (Septal wall hypokinesis consistent  with postoperative state.). Left  ventricular diastolic parameters are consistent with Grade II diastolic dysfunction (pseudonormalization).  Right ventricular systolic function is normal. The right ventricular size is normal. Tricuspid regurgitation signal is inadequate for assessing PA pressure.  Left atrial size was  mildly dilated.   Impression and Plan:  [name] [name] has been referred for pre-anesthesia review and clearance prior to him undergoing the planned anesthetic and procedural courses. Available labs, pertinent testing, and imaging results were personally reviewed by me. This patient has been appropriately cleared  by cardiology with an overall ACCEPTABLE risk of significant perioperative cardiovascular complications.  Based on clinical review performed today (11/12/21), barring any significant acute changes in the patient's overall condition, it is anticipated that he will be able to proceed with the planned surgical intervention. Any acute changes in clinical condition may necessitate his procedure being postponed and/or cancelled. Patient will meet with anesthesia team (MD and/or CRNA) on the day of his procedure for preoperative evaluation/assessment. Questions regarding anesthetic course will be fielded at that time.   Pre-surgical instructions were reviewed with the patient during his PAT appointment and questions were fielded by PAT clinical staff. Patient was advised that if any questions or concerns arise prior to his procedure then he should return a call to PAT and/or his surgeon's office to discuss.  Honor Loh, MSN, APRN, FNP-C, CEN [facility]  Peri-operative Services Nurse Practitioner Phone: [phone] Fax: [phone] 11/12/21 11:26 AM  NOTE: This note has been prepared using Dragon dictation software. Despite my best ability to proofread, there is always the potential that unintentional transcriptional errors may still occur from this process.

## 2021-11-12 NOTE — Progress Notes (Signed)
  Perioperative Services: Pre-Admission/Anesthesia Testing  Abnormal Lab Notification    Date: 11/12/21  Name: Mozell Hardacre MRN:   [id number]  Re: Abnormal labs noted during PAT appointment   Provider(s) Notified: Jules Husbands, MD Notification mode: Routed and/or faxed via [facility]   ABNORMAL LAB VALUE(S): Lab Results  Component Value Date   GLUCOSE 256 (H) 11/09/2021    Notes: Patient with a T2DM diagnosis. He is currently on parenteral therapies using both insulin aspart and degludec. Last Hgb A1c was 10.3% on 09/12/2021. In efforts to reduce his risk of developing SSI, or other potential perioperative complications, this communication is being sent in order to determine if patient is deemed to have adequate medical optimization, including preoperative glycemic control.   With that being said, the benefit of improving glycemic control must be weighed against the overall risk associated with delaying a necessary elective surgery for this patient.   Honor Loh, MSN, APRN, FNP-C, CEN [facility]  Peri-operative Services Nurse Practitioner Phone: [phone] 11/12/21 11:17 AM

## 2021-11-12 NOTE — Anesthesia Preprocedure Evaluation (Signed)
Anesthesia Evaluation  Patient identified by MRN, date of birth, ID band Patient awake    Reviewed: Allergy & Precautions, NPO status , Patient's Chart, lab work & pertinent test results  History of Anesthesia Complications Negative for: history of anesthetic complications  Airway Mallampati: III       Dental  (+) Missing, Chipped,    Pulmonary sleep apnea (not using CPAP) , COPD,  COPD inhaler, Current Smoker, former smoker,    Pulmonary exam normal        Cardiovascular hypertension, Pt. on medications and Pt. on home beta blockers + angina (stable angina) + CAD, + Past MI, + CABG ( x 4), + Peripheral Vascular Disease (Aortoiliac duplex) and +CHF  (-) dysrhythmias (-) Valvular Problems/Murmurs Rhythm:Regular Rate:Normal  TTE 02/09/2021: EF 55%; septal HK, LAE; G2DD.   4v CABG: LIMA->LAD, SVG->RCA, SVG->OM1, SVG->D1  Cardiology visit 10/30/21 reviewed   Neuro/Psych neg Seizures Anxiety Depression Neuropathy  Neuromuscular disease    GI/Hepatic Neg liver ROS, PUD, GERD  Medicated,  Endo/Other  diabetes, Poorly Controlled, Type 2, Insulin DependentA1c 09/12/2021 10.3  Renal/GU Renal InsufficiencyRenal disease (stage IV )  negative genitourinary   Musculoskeletal  (+) Arthritis , Cervical Grade 1 Retrolisthesis of C4/C5 and C5/C6   Abdominal Normal abdominal exam  (+)   Peds negative pediatric ROS (+)  Hematology negative hematology ROS (+)   Anesthesia Other Findings PAT note reviewed  Reproductive/Obstetrics negative OB ROS                            Anesthesia Physical  Anesthesia Plan  ASA: III  Anesthesia Plan: General   Post-op Pain Management:    Induction: Intravenous  PONV Risk Score and Plan: 2 and Ondansetron, Dexamethasone and Midazolam  Airway Management Planned: Oral ETT  Additional Equipment:   Intra-op Plan:   Post-operative Plan: Extubation in  OR  Informed Consent: I have reviewed the patients History and Physical, chart, labs and discussed the procedure including the risks, benefits and alternatives for the proposed anesthesia with the patient or authorized representative who has indicated his/her understanding and acceptance.     Dental advisory given  Plan Discussed with: CRNA and Anesthesiologist  Anesthesia Plan Comments:        Anesthesia Quick Evaluation

## 2021-11-13 ENCOUNTER — Ambulatory Visit: Payer: Managed Care, Other (non HMO) | Admitting: Urgent Care

## 2021-11-13 ENCOUNTER — Other Ambulatory Visit: Payer: Self-pay

## 2021-11-13 ENCOUNTER — Encounter: Payer: Self-pay | Admitting: Surgery

## 2021-11-13 ENCOUNTER — Ambulatory Visit
Admission: RE | Admit: 2021-11-13 | Discharge: 2021-11-13 | Disposition: A | Discharge status: Home / Self Care | Payer: Managed Care, Other (non HMO) | Attending: Surgery | Admitting: Surgery

## 2021-11-13 ENCOUNTER — Encounter
Admission: RE | Disposition: A | Discharge status: Home / Self Care | Payer: Self-pay | Source: Home / Self Care | Attending: Surgery

## 2021-11-13 DIAGNOSIS — E1165 Type 2 diabetes mellitus with hyperglycemia: Secondary | ICD-10-CM | POA: Diagnosis not present

## 2021-11-13 DIAGNOSIS — I255 Ischemic cardiomyopathy: Secondary | ICD-10-CM | POA: Diagnosis not present

## 2021-11-13 DIAGNOSIS — E1151 Type 2 diabetes mellitus with diabetic peripheral angiopathy without gangrene: Secondary | ICD-10-CM | POA: Insufficient documentation

## 2021-11-13 DIAGNOSIS — Z794 Long term (current) use of insulin: Secondary | ICD-10-CM | POA: Diagnosis not present

## 2021-11-13 DIAGNOSIS — I2510 Atherosclerotic heart disease of native coronary artery without angina pectoris: Secondary | ICD-10-CM | POA: Diagnosis not present

## 2021-11-13 DIAGNOSIS — F17210 Nicotine dependence, cigarettes, uncomplicated: Secondary | ICD-10-CM | POA: Insufficient documentation

## 2021-11-13 DIAGNOSIS — N186 End stage renal disease: Secondary | ICD-10-CM | POA: Diagnosis not present

## 2021-11-13 DIAGNOSIS — I132 Hypertensive heart and chronic kidney disease with heart failure and with stage 5 chronic kidney disease, or end stage renal disease: Secondary | ICD-10-CM | POA: Diagnosis present

## 2021-11-13 DIAGNOSIS — I5020 Unspecified systolic (congestive) heart failure: Secondary | ICD-10-CM | POA: Diagnosis not present

## 2021-11-13 DIAGNOSIS — Z992 Dependence on renal dialysis: Secondary | ICD-10-CM | POA: Diagnosis not present

## 2021-11-13 DIAGNOSIS — I252 Old myocardial infarction: Secondary | ICD-10-CM | POA: Insufficient documentation

## 2021-11-13 DIAGNOSIS — Z951 Presence of aortocoronary bypass graft: Secondary | ICD-10-CM | POA: Diagnosis not present

## 2021-11-13 DIAGNOSIS — R7309 Other abnormal glucose: Secondary | ICD-10-CM

## 2021-11-13 DIAGNOSIS — N189 Chronic kidney disease, unspecified: Secondary | ICD-10-CM

## 2021-11-13 DIAGNOSIS — E1122 Type 2 diabetes mellitus with diabetic chronic kidney disease: Secondary | ICD-10-CM | POA: Insufficient documentation

## 2021-11-13 DIAGNOSIS — E871 Hypo-osmolality and hyponatremia: Secondary | ICD-10-CM

## 2021-11-13 DIAGNOSIS — Z01812 Encounter for preprocedural laboratory examination: Secondary | ICD-10-CM

## 2021-11-13 DIAGNOSIS — E1142 Type 2 diabetes mellitus with diabetic polyneuropathy: Secondary | ICD-10-CM

## 2021-11-13 DIAGNOSIS — N184 Chronic kidney disease, stage 4 (severe): Secondary | ICD-10-CM

## 2021-11-13 DIAGNOSIS — E876 Hypokalemia: Secondary | ICD-10-CM

## 2021-11-13 HISTORY — DX: Presence of aortocoronary bypass graft: Z95.1

## 2021-11-13 HISTORY — DX: Type 2 diabetes mellitus without complications: E11.9

## 2021-11-13 HISTORY — PX: CAPD INSERTION: SHX5233

## 2021-11-13 HISTORY — DX: Other specified health status: Z78.9

## 2021-11-13 LAB — POCT I-STAT, CHEM 8
BUN: 71 mg/dL — ABNORMAL HIGH (ref 8–23)
Calcium, Ion: 1.11 mmol/L — ABNORMAL LOW (ref 1.15–1.40)
Chloride: 102 mmol/L (ref 98–111)
Creatinine, Ser: 4.1 mg/dL — ABNORMAL HIGH (ref 0.61–1.24)
Glucose, Bld: 172 mg/dL — ABNORMAL HIGH (ref 70–99)
HCT: 42 % (ref 39.0–52.0)
Hemoglobin: 14.3 g/dL (ref 13.0–17.0)
Potassium: 4.9 mmol/L (ref 3.5–5.1)
Sodium: 135 mmol/L (ref 135–145)
TCO2: 24 mmol/L (ref 22–32)

## 2021-11-13 LAB — GLUCOSE, CAPILLARY
Glucose-Capillary: 187 mg/dL — ABNORMAL HIGH (ref 70–99)
Glucose-Capillary: 186 mg/dL — ABNORMAL HIGH (ref 70–99)

## 2021-11-13 SURGERY — LAPAROSCOPIC INSERTION CONTINUOUS AMBULATORY PERITONEAL DIALYSIS  (CAPD) CATHETER
Anesthesia: General

## 2021-11-13 MED ORDER — PROPOFOL 10 MG/ML IV BOLUS
INTRAVENOUS | Status: DC | PRN
  Administered 2021-11-13: 170 mg via INTRAVENOUS

## 2021-11-13 MED ORDER — ROCURONIUM BROMIDE 100 MG/10ML IV SOLN
INTRAVENOUS | Status: DC | PRN
  Administered 2021-11-13: 50 mg via INTRAVENOUS
  Administered 2021-11-13 (×2): 10 mg via INTRAVENOUS

## 2021-11-13 MED ORDER — ACETAMINOPHEN 10 MG/ML IV SOLN: 1000.0000 mg | Freq: Once | INTRAVENOUS | Status: DC | PRN

## 2021-11-13 MED ORDER — LIDOCAINE HCL (CARDIAC) PF 100 MG/5ML IV SOSY
PREFILLED_SYRINGE | INTRAVENOUS | Status: DC | PRN
  Administered 2021-11-13: 100 mg via INTRAVENOUS

## 2021-11-13 MED ORDER — BUPIVACAINE-EPINEPHRINE 0.25% -1:200000 IJ SOLN
INTRAMUSCULAR | Status: DC | PRN
  Administered 2021-11-13: 30 mL

## 2021-11-13 MED ORDER — OXYCODONE HCL 5 MG/5ML PO SOLN: 5.0000 mg | Freq: Once | ORAL | Status: DC | PRN

## 2021-11-13 MED ORDER — EPHEDRINE SULFATE (PRESSORS) 50 MG/ML IJ SOLN
INTRAMUSCULAR | Status: DC | PRN
  Administered 2021-11-13: 10 mg via INTRAVENOUS

## 2021-11-13 MED ORDER — LIDOCAINE HCL (PF) 2 % IJ SOLN
INTRAMUSCULAR | Status: AC
Start: 2021-11-13 — End: ?
  Filled 2021-11-13: qty 5

## 2021-11-13 MED ORDER — GLYCOPYRROLATE 0.2 MG/ML IJ SOLN
INTRAMUSCULAR | Status: DC | PRN
  Administered 2021-11-13: .2 mg via INTRAVENOUS

## 2021-11-13 MED ORDER — ONDANSETRON HCL 4 MG/2ML IJ SOLN
INTRAMUSCULAR | Status: DC | PRN
  Administered 2021-11-13: 4 mg via INTRAVENOUS

## 2021-11-13 MED ORDER — FENTANYL CITRATE (PF) 100 MCG/2ML IJ SOLN
INTRAMUSCULAR | Status: AC
  Filled 2021-11-13: qty 2

## 2021-11-13 MED ORDER — ACETAMINOPHEN 500 MG PO TABS
ORAL_TABLET | ORAL | Status: AC
  Administered 2021-11-13: 1000 mg via ORAL
  Filled 2021-11-13: qty 2

## 2021-11-13 MED ORDER — LACTATED RINGERS IV SOLN: INTRAVENOUS | Status: DC | PRN

## 2021-11-13 MED ORDER — FENTANYL CITRATE (PF) 100 MCG/2ML IJ SOLN
INTRAMUSCULAR | Status: DC | PRN
Start: 2021-11-13 — End: 2021-11-13
  Administered 2021-11-13 (×2): 25 ug via INTRAVENOUS

## 2021-11-13 MED ORDER — BUPIVACAINE-EPINEPHRINE (PF) 0.25% -1:200000 IJ SOLN
INTRAMUSCULAR | Status: AC
Start: 2021-11-13 — End: ?
  Filled 2021-11-13: qty 30

## 2021-11-13 MED ORDER — ONDANSETRON HCL 4 MG/2ML IJ SOLN
INTRAMUSCULAR | Status: AC
  Filled 2021-11-13: qty 2

## 2021-11-13 MED ORDER — GABAPENTIN 300 MG PO CAPS
ORAL_CAPSULE | ORAL | Status: AC
  Administered 2021-11-13: 300 mg via ORAL
  Filled 2021-11-13: qty 1

## 2021-11-13 MED ORDER — PHENYLEPHRINE HCL (PRESSORS) 10 MG/ML IV SOLN
INTRAVENOUS | Status: DC | PRN
  Administered 2021-11-13: 7 mL via INTRAVENOUS
  Administered 2021-11-13: 160 ug via INTRAVENOUS
  Administered 2021-11-13: 320 ug via INTRAVENOUS

## 2021-11-13 MED ORDER — FENTANYL CITRATE (PF) 100 MCG/2ML IJ SOLN: 25.0000 ug | INTRAMUSCULAR | Status: DC | PRN

## 2021-11-13 MED ORDER — MIDAZOLAM HCL 2 MG/2ML IJ SOLN
INTRAMUSCULAR | Status: DC | PRN
  Administered 2021-11-13: 2 mg via INTRAVENOUS

## 2021-11-13 MED ORDER — PROMETHAZINE HCL 25 MG/ML IJ SOLN: 6.2500 mg | INTRAMUSCULAR | Status: DC | PRN

## 2021-11-13 MED ORDER — OXYCODONE HCL 5 MG PO TABS: 5.0000 mg | ORAL_TABLET | Freq: Once | ORAL | Status: DC | PRN

## 2021-11-13 MED ORDER — PHENYLEPHRINE HCL-NACL 20-0.9 MG/250ML-% IV SOLN
INTRAVENOUS | Status: DC | PRN
  Administered 2021-11-13: 40 ug/min via INTRAVENOUS

## 2021-11-13 MED ORDER — SODIUM CHLORIDE 0.9 % IR SOLN
Status: DC | PRN
  Administered 2021-11-13: 1000 mL

## 2021-11-13 MED ORDER — HYDROCODONE-ACETAMINOPHEN 5-325 MG PO TABS: 1.0000 | ORAL_TABLET | ORAL | 0 refills | Status: DC | PRN

## 2021-11-13 MED ORDER — CEFAZOLIN SODIUM-DEXTROSE 2-4 GM/100ML-% IV SOLN
INTRAVENOUS | Status: AC
  Filled 2021-11-13: qty 100

## 2021-11-13 MED ORDER — GLYCOPYRROLATE 0.2 MG/ML IJ SOLN
INTRAMUSCULAR | Status: AC
  Filled 2021-11-13: qty 1

## 2021-11-13 MED ORDER — HEPARIN SODIUM (PORCINE) 1000 UNIT/ML IJ SOLN
INTRAMUSCULAR | Status: AC
  Filled 2021-11-13: qty 10

## 2021-11-13 MED ORDER — BUPIVACAINE LIPOSOME 1.3 % IJ SUSP
INTRAMUSCULAR | Status: DC | PRN
  Administered 2021-11-13: 20 mL

## 2021-11-13 MED ORDER — ROCURONIUM BROMIDE 10 MG/ML (PF) SYRINGE
PREFILLED_SYRINGE | INTRAVENOUS | Status: AC
  Filled 2021-11-13: qty 10

## 2021-11-13 MED ORDER — MIDAZOLAM HCL 2 MG/2ML IJ SOLN
INTRAMUSCULAR | Status: AC
  Filled 2021-11-13: qty 2

## 2021-11-13 MED ORDER — PHENYLEPHRINE HCL-NACL 20-0.9 MG/250ML-% IV SOLN
INTRAVENOUS | Status: AC
  Filled 2021-11-13: qty 250

## 2021-11-13 MED ORDER — BUPIVACAINE LIPOSOME 1.3 % IJ SUSP
INTRAMUSCULAR | Status: AC
  Filled 2021-11-13: qty 20

## 2021-11-13 MED ORDER — CHLORHEXIDINE GLUCONATE 0.12 % MT SOLN
OROMUCOSAL | Status: AC
  Administered 2021-11-13: 15 mL via OROMUCOSAL
  Filled 2021-11-13: qty 15

## 2021-11-13 MED ORDER — DROPERIDOL 2.5 MG/ML IJ SOLN: 0.6250 mg | Freq: Once | INTRAMUSCULAR | Status: DC | PRN

## 2021-11-13 MED ORDER — PROPOFOL 10 MG/ML IV BOLUS
INTRAVENOUS | Status: AC
  Filled 2021-11-13: qty 40

## 2021-11-13 MED ORDER — SUGAMMADEX SODIUM 200 MG/2ML IV SOLN
INTRAVENOUS | Status: DC | PRN
  Administered 2021-11-13: 200 mg via INTRAVENOUS

## 2021-11-13 SURGICAL SUPPLY — 49 items
"PENCIL ELECTRO HAND CTR " (MISCELLANEOUS) ×1 IMPLANT
ADAPTER CATH DIALYSIS 4X8 IT L (MISCELLANEOUS) ×1 IMPLANT
ADAPTER TITANIUM MEDIONICS (MISCELLANEOUS) ×2 IMPLANT
BIOPATCH WHT 1IN DISK W/4.0 H (GAUZE/BANDAGES/DRESSINGS) ×1 IMPLANT
BLADE CLIPPER SURG (BLADE) IMPLANT
CATH EXTENDED DIALYSIS (CATHETERS) ×2 IMPLANT
DERMABOND ADVANCED (GAUZE/BANDAGES/DRESSINGS) ×2
DERMABOND ADVANCED .7 DNX12 (GAUZE/BANDAGES/DRESSINGS) ×1 IMPLANT
ELECT CAUTERY BLADE 6.4 (BLADE) ×2 IMPLANT
ELECT REM PT RETURN 9FT ADLT (ELECTROSURGICAL) ×2
ELECTRODE REM PT RTRN 9FT ADLT (ELECTROSURGICAL) ×1 IMPLANT
GLOVE BIO SURGEON STRL SZ7 (GLOVE) ×8 IMPLANT
GOWN STRL REUS W/ TWL LRG LVL3 (GOWN DISPOSABLE) ×2 IMPLANT
GOWN STRL REUS W/TWL LRG LVL3 (GOWN DISPOSABLE) ×3
GRASPER SUT TROCAR 14GX15 (MISCELLANEOUS) ×2 IMPLANT
IV NS 1000ML (IV SOLUTION) ×1
IV NS 1000ML BAXH (IV SOLUTION) ×1 IMPLANT
KIT TURNOVER KIT A (KITS) ×2 IMPLANT
MANIFOLD NEPTUNE II (INSTRUMENTS) ×2 IMPLANT
MINICAP W/POVIDONE IODINE SOL (MISCELLANEOUS) ×2 IMPLANT
NDL INSUFFLATION 14GA 120MM (NEEDLE) ×1 IMPLANT
NDL SAFETY ECLIPSE 18X1.5 (NEEDLE) ×1 IMPLANT
NEEDLE HYPO 18GX1.5 SHARP (NEEDLE) ×1
NEEDLE HYPO 22GX1.5 SAFETY (NEEDLE) ×2 IMPLANT
NEEDLE INSUFFLATION 14GA 120MM (NEEDLE) ×2 IMPLANT
NS IRRIG 500ML POUR BTL (IV SOLUTION) ×2 IMPLANT
PACK LAP CHOLECYSTECTOMY (MISCELLANEOUS) ×2 IMPLANT
PENCIL ELECTRO HAND CTR (MISCELLANEOUS) ×2 IMPLANT
SET CYSTO W/LG BORE CLAMP LF (SET/KITS/TRAYS/PACK) ×2 IMPLANT
SET TRANSFER 6 W/TWIST CLAMP 5 (SET/KITS/TRAYS/PACK) ×2 IMPLANT
SET TUBE SMOKE EVAC HIGH FLOW (TUBING) ×2 IMPLANT
SLEEVE ENDOPATH XCEL 5M (ENDOMECHANICALS) ×2 IMPLANT
SPONGE DRAIN TRACH 4X4 STRL 2S (GAUZE/BANDAGES/DRESSINGS) ×2 IMPLANT
SPONGE T-LAP 18X18 ~~LOC~~+RFID (SPONGE) ×3 IMPLANT
STYLET FALLER (MISCELLANEOUS) IMPLANT
SUT ETHILON 3-0 FS-10 30 BLK (SUTURE) ×2
SUT MNCRL 4-0 (SUTURE) ×2
SUT MNCRL 4-0 27XMFL (SUTURE) ×2
SUT VIC AB 3-0 SH 27 (SUTURE) ×1
SUT VIC AB 3-0 SH 27X BRD (SUTURE) ×1 IMPLANT
SUT VICRYL 0 AB UR-6 (SUTURE) ×4 IMPLANT
SUTURE EHLN 3-0 FS-10 30 BLK (SUTURE) ×1 IMPLANT
SUTURE MNCRL 4-0 27XMF (SUTURE) ×1 IMPLANT
SYR 20ML LL LF (SYRINGE) ×2 IMPLANT
SYR 3ML LL SCALE MARK (SYRINGE) ×2 IMPLANT
SYS KII FIOS ACCESS ABD 5X100 (TROCAR) ×2
SYSTEM KII FIOS ACES ABD 5X100 (TROCAR) ×1 IMPLANT
TROCAR XCEL NON-BLD 5MMX100MML (ENDOMECHANICALS) ×2 IMPLANT
WATER STERILE IRR 500ML POUR (IV SOLUTION) ×1 IMPLANT

## 2021-11-13 NOTE — Transfer of Care (Signed)
Immediate Anesthesia Transfer of Care Note  Patient: [name] [name]  Procedure(s) Performed: LAPAROSCOPIC INSERTION CONTINUOUS AMBULATORY PERITONEAL DIALYSIS  (CAPD) CATHETER  Patient Location: PACU  Anesthesia Type:General  Level of Consciousness: awake and patient cooperative  Airway & Oxygen Therapy: Patient Spontanous Breathing and Patient connected to face mask  Post-op Assessment: Report given to RN and Post -op Vital signs reviewed and stable  Post vital signs: Reviewed and stable  Last Vitals:  Vitals Value Taken Time  BP 143/70 11/13/21 1522  Temp 36 C 11/13/21 1522  Pulse 61 11/13/21 1522  Resp 10 11/13/21 1523  SpO2 100 % 11/13/21 1522    Last Pain:  Vitals:   11/13/21 1244  TempSrc: Temporal  PainSc: 5          Complications: No notable events documented.

## 2021-11-13 NOTE — Progress Notes (Signed)
Patient had a open wound to the right great toe that he wrapped at home.  I notified Dr. Dahlia Byes and he took a look at it.  He said it was ok to wrap it and ok to proceed with the surgery.  Dr. Dahlia Byes instructed the patient to make an appointment with his podiatrist.

## 2021-11-13 NOTE — Anesthesia Procedure Notes (Signed)
Procedure Name: Intubation Date/Time: 11/13/2021 1:29 PM  Performed by: Carmelina Paddock, RNPre-anesthesia Checklist: Patient identified, Patient being monitored, Timeout performed, Emergency Drugs available and Suction available Patient Re-evaluated:Patient Re-evaluated prior to induction Oxygen Delivery Method: Circle system utilized Preoxygenation: Pre-oxygenation with 100% oxygen Induction Type: IV induction Ventilation: Mask ventilation without difficulty Laryngoscope Size: Mac and 4 Grade View: Grade I Tube type: Oral Tube size: 7.0 mm Number of attempts: 1 Airway Equipment and Method: Stylet Placement Confirmation: ETT inserted through vocal cords under direct vision, positive ETCO2 and breath sounds checked- equal and bilateral Secured at: 22 cm Tube secured with: Tape Dental Injury: Teeth and Oropharynx as per pre-operative assessment

## 2021-11-13 NOTE — Anesthesia Postprocedure Evaluation (Signed)
Anesthesia Post Note  Patient: [name] [name]  Procedure(s) Performed: LAPAROSCOPIC INSERTION CONTINUOUS AMBULATORY PERITONEAL DIALYSIS  (CAPD) CATHETER  Patient location during evaluation: PACU Anesthesia Type: General Level of consciousness: awake and alert, oriented and patient cooperative Pain management: pain level controlled Vital Signs Assessment: post-procedure vital signs reviewed and stable Respiratory status: spontaneous breathing, nonlabored ventilation and respiratory function stable Cardiovascular status: blood pressure returned to baseline and stable Postop Assessment: adequate PO intake Anesthetic complications: no   No notable events documented.   Last Vitals:  Vitals:   11/13/21 1600 11/13/21 1632  BP: 135/66 136/80  Pulse: 62 65  Resp: 15 14  Temp: (!) 36.1 C (!) 36.3 C  SpO2: 97% 100%    Last Pain:  Vitals:   11/13/21 1632  TempSrc: Temporal  PainSc: 0-No pain                 Darrin Nipper

## 2021-11-13 NOTE — Discharge Instructions (Addendum)
Peritoneal Dialysis Catheter Placement, Care After The following information offers guidance on how to care for yourself after your procedure. Your health care provider may also give you more specific instructions. If you have problems or questions, contact your health care provider. What can I expect after the procedure? After the procedure, it is common to have some pain or discomfort in your abdomen and your incision area. You may need to wait 2 weeks after your procedure before you can start peritoneal dialysis treatment. If you need dialysis before that time, your health care provider may begin peritoneal dialysis treatment early or offer kidney dialysis treatments (hemodialysis) until you heal. Follow these instructions at home: Incision care  Follow instructions from your health care provider about how to take care of your incision or incisions. Make sure you: Wash your hands with soap and water for at least 20 seconds before and after you change your bandage (dressing). If soap and water are not available, use hand sanitizer. Change your dressing only as told by your health care provider. Your health care provider may tell you not to touch or change your dressing. Leave stitches (sutures), staples, skin glue, or adhesive strips in place. These skin closures may need to stay in place for 2 weeks or longer. If adhesive strip edges start to loosen and curl up, you may trim the loose edges. Do not remove adhesive strips completely unless your health care provider tells you to do that. Check your incision areas every day for signs of infection. If you were instructed not to touch or change your dressing, look at your dressing for signs of infection. Check for: Redness, swelling, or more pain. Fluid or blood. Warmth. Pus or a bad smell. Medicines Take over-the-counter and prescription medicines only as told by your health care provider. If you were prescribed an antibiotic medicine, use it as  told by your health care provider. Do not stop using the antibiotic even if you start to feel better. Ask your health care provider if the medicine prescribed to you requires you to avoid driving or using machinery. Driving Do not drive or ride in a car until your health care provider approves. Your seat belt could move the catheter out of position or cause irritation by rubbing on your incision. Activity  Rest and limit your activity. Do not lift anything that is heavier than 10 lb (4.5 kg), or the limit that you are told, until your health care provider says that it is safe. Return to your normal activities as told by your health care provider. Ask your health care provider what activities are safe for you. Managing constipation Your condition may cause constipation. To prevent or treat constipation, you may need to: Drink enough fluid to keep your urine pale yellow. Take over-the-counter or prescription medicines. Eat foods that are high in fiber, such as beans, whole grains, and fresh fruits and vegetables. Limit foods that are high in fat and processed sugars, such as fried or sweet foods. General instructions Do not use any products that contain nicotine or tobacco. These products include cigarettes, chewing tobacco, and vaping devices, such as e-cigarettes. If you need help quitting, ask your health care provider. Follow instructions from your health care provider about eating or drinking restrictions. Do not take baths, swim, or use a hot tub until your health care provider approves. Ask your health care provider if you may take showers. You may only be allowed to take sponge baths. Wear loose-fitting clothing that keeps   the catheter covered so that it cannot get caught on something. Keep your catheter clean and dry. Keep all follow-up visits. This is important. Contact a health care provider if: You have a fever or chills. You have warmth, redness, swelling, or more pain around an  incision. You have fluid or blood coming from an incision. You have pus or a bad smell coming from an incision. You cannot eat or drink without vomiting. Get help right away if: You have problems breathing. You are confused. You have trouble speaking. You have severe pain in your abdomen that does not get better with treatment. You have bright red blood in your stool (feces), or your stool is dark black and looks like tar. These symptoms may represent a serious problem that is an emergency. Do not wait to see if the symptoms will go away. Get medical help right away. Call your local emergency services (911 in the [place] Do not drive yourself to the hospital. Summary After the procedure, it is common to have some pain or discomfort in your abdomen, your incision area, or both. You may have to wait 2 weeks after your procedure before you can start peritoneal dialysis treatment. Check your incision area every day for signs of infection. Get medical help right away if you have severe pain in your abdomen that does not get better with treatment. This information is not intended to replace advice given to you by your health care provider. Make sure you discuss any questions you have with your health care provider. Document Revised: 12/30/2019 Document Reviewed: 12/30/2019 Elsevier Patient Education  2023 [employer]        AMBULATORY SURGERY  DISCHARGE INSTRUCTIONS   The drugs that you were given will stay in your system until tomorrow so for the next 24 hours you should not:  Drive an automobile Make any legal decisions Drink any alcoholic beverage   You may resume regular meals tomorrow.  Today it is better to start with liquids and gradually work up to solid foods.  You may eat anything you prefer, but it is better to start with liquids, then soup and crackers, and gradually work up to solid foods.   Please notify your doctor immediately if you have any unusual bleeding,  trouble breathing, redness and pain at the surgery site, drainage, fever, or pain not relieved by medication.     Your post-operative visit with Dr.                                       is: Date:                        Time:    Please call to schedule your post-operative visit.  Additional Instructions:  

## 2021-11-13 NOTE — Interval H&P Note (Signed)
History and Physical Interval Note:  11/13/2021 12:56 PM  [name] [name]  has presented today for surgery, with the diagnosis of ESRD.  The various methods of treatment have been discussed with the patient and family. After consideration of risks, benefits and other options for treatment, the patient has consented to  Procedure(s): [name]  (CAPD) CATHETER (N/A) as a surgical intervention.  The patient's history has been reviewed, patient examined, no change in status, stable for surgery.  I have reviewed the patient's chart and labs.  Questions were answered to the patient's satisfaction.     [facility]

## 2021-11-13 NOTE — Op Note (Signed)
Laparoscopic placement of peritoneal Dialysis catheter ( Total three cuffs) Laparoscopic Omentopexy   Pre-operative Diagnosis: ESRD   Post-operative Diagnosis: same     Surgeon: Caroleen Hamman, MD FACS   Anesthesia: Gen. with endotracheal tube      Findings: Catheter within pelvis Good return after infusing Peritoneal cavity   Estimated Blood Loss: 5cc              Complications: none     Procedure Details  The patient was seen again in the Holding Room. The benefits, complications, treatment options, and expected outcomes were discussed with the patient. The risks of bleeding, infection, recurrence of symptoms, failure to resolve symptoms, catheter malfunction bowel injury, any of which could require further surgery were reviewed with the patient. The likelihood of improving the patient's symptoms with return to their baseline status is good.  The patient and/or family concurred with the proposed plan, giving informed consent.  The patient was taken to Operating Room, identified and the procedure verified. A Time Out was held and the above information confirmed.   Prior to the induction of general anesthesia, antibiotic prophylaxis was administered. VTE prophylaxis was in place. General endotracheal anesthesia was then administered and tolerated well. After the induction, the abdomen was prepped with Chloraprep and draped in the sterile fashion. The patient was positioned in the supine position.   Periumbilical incision created to the left of the midline.   The anterior rectus fascia identified and incised, rectus muscle identified and retracted laterally, using a port We were able to tunnel into the retro rectus space for about 6 cms. Peritoneum was pierced off the midline. Pneumoperitoneum was obtained w/o hemodynamic changes. We placed two additional laparoscopic ports under direct visualization.   We were able to thread the catheter via the laparoscopic port within the retrorectus  space in the standard fashion.  Under direct visualization we made sure that the coil portion of the catheter layed within the pelvis without any kinks.  With some evidence of epiploic appendicitis within the sigmoid colon that can potentially interfere with the patency of the catheter. I was able to also perform a counterincision in the left subcostal area and Tailored an additional extension of the catheter . The  Extension had 2 cuffs and I was able to connect it to parse together with the titanium connector in the standard fashion.  There was no evidence of any kinks.  The exit site was to the left upper quadrant. We instilled heparinized saline a liter into the pelvis and we had very good return. He did have generous omentum, attention was then turned to the omentum and using a PMI device we were able to perform an omentopexy and tacked the omentum to the abdominal wall using 2 interrupted 2-0 Vicryl sutures in the standard fashion. All skin incisions  were infiltrated with a liposomal Marcaine. 4-0 subcuticular Monocryl was used to close the skin. Dermabond was  applied. Sterile dressing applied to the catheter. The patient was then extubated and brought to the recovery room in stable condition. Sponge, lap, and needle counts were correct at closure and at the conclusion of the case.               Caroleen Hamman, MD, FACS

## 2021-11-14 ENCOUNTER — Encounter: Payer: Self-pay | Admitting: Surgery

## 2021-11-22 ENCOUNTER — Telehealth: Payer: Self-pay | Admitting: *Deleted

## 2021-11-22 NOTE — Telephone Encounter (Signed)
Pt has been approved for Praluent 150 mg/ml inj. PA approved 11/22/2021-11/23/2022.

## 2021-12-04 ENCOUNTER — Encounter: Payer: Self-pay | Admitting: Physician Assistant

## 2021-12-04 ENCOUNTER — Ambulatory Visit (INDEPENDENT_AMBULATORY_CARE_PROVIDER_SITE_OTHER): Payer: Managed Care, Other (non HMO) | Admitting: Physician Assistant

## 2021-12-04 VITALS — BP 159/76 | HR 65 | Temp 98.7°F | Wt 208.4 lb

## 2021-12-04 DIAGNOSIS — Z09 Encounter for follow-up examination after completed treatment for conditions other than malignant neoplasm: Secondary | ICD-10-CM | POA: Diagnosis not present

## 2021-12-04 DIAGNOSIS — E1122 Type 2 diabetes mellitus with diabetic chronic kidney disease: Secondary | ICD-10-CM

## 2021-12-04 DIAGNOSIS — N186 End stage renal disease: Secondary | ICD-10-CM

## 2021-12-04 DIAGNOSIS — Z992 Dependence on renal dialysis: Secondary | ICD-10-CM

## 2021-12-04 DIAGNOSIS — N184 Chronic kidney disease, stage 4 (severe): Secondary | ICD-10-CM

## 2021-12-04 NOTE — Patient Instructions (Signed)
If you have any concerns or questions, please feel free to call our office.   Peritoneal Dialysis Catheter Placement, Care After The following information offers guidance on how to care for yourself after your procedure. Your health care provider may also give you more specific instructions. If you have problems or questions, contact your health care provider. What can I expect after the procedure? After the procedure, it is common to have some pain or discomfort in your abdomen and your incision area. You may need to wait 2 weeks after your procedure before you can start peritoneal dialysis treatment. If you need dialysis before that time, your health care provider may begin peritoneal dialysis treatment early or offer kidney dialysis treatments (hemodialysis) until you heal. Follow these instructions at home: Incision care  Follow instructions from your health care provider about how to take care of your incision or incisions. Make sure you: Wash your hands with soap and water for at least 20 seconds before and after you change your bandage (dressing). If soap and water are not available, use hand sanitizer. Change your dressing only as told by your health care provider. Your health care provider may tell you not to touch or change your dressing. Leave stitches (sutures), staples, skin glue, or adhesive strips in place. These skin closures may need to stay in place for 2 weeks or longer. If adhesive strip edges start to loosen and curl up, you may trim the loose edges. Do not remove adhesive strips completely unless your health care provider tells you to do that. Check your incision areas every day for signs of infection. If you were instructed not to touch or change your dressing, look at your dressing for signs of infection. Check for: Redness, swelling, or more pain. Fluid or blood. Warmth. Pus or a bad smell. Medicines Take over-the-counter and prescription medicines only as told by your  health care provider. If you were prescribed an antibiotic medicine, use it as told by your health care provider. Do not stop using the antibiotic even if you start to feel better. Ask your health care provider if the medicine prescribed to you requires you to avoid driving or using machinery. Driving Do not drive or ride in a car until your health care provider approves. Your seat belt could move the catheter out of position or cause irritation by rubbing on your incision. Activity  Rest and limit your activity. Do not lift anything that is heavier than 10 lb (4.5 kg), or the limit that you are told, until your health care provider says that it is safe. Return to your normal activities as told by your health care provider. Ask your health care provider what activities are safe for you. Managing constipation Your condition may cause constipation. To prevent or treat constipation, you may need to: Drink enough fluid to keep your urine pale yellow. Take over-the-counter or prescription medicines. Eat foods that are high in fiber, such as beans, whole grains, and fresh fruits and vegetables. Limit foods that are high in fat and processed sugars, such as fried or sweet foods. General instructions Do not use any products that contain nicotine or tobacco. These products include cigarettes, chewing tobacco, and vaping devices, such as e-cigarettes. If you need help quitting, ask your health care provider. Follow instructions from your health care provider about eating or drinking restrictions. Do not take baths, swim, or use a hot tub until your health care provider approves. Ask your health care provider if you may   take showers. You may only be allowed to take sponge baths. Wear loose-fitting clothing that keeps the catheter covered so that it cannot get caught on something. Keep your catheter clean and dry. Keep all follow-up visits. This is important. Contact a health care provider if: You have a  fever or chills. You have warmth, redness, swelling, or more pain around an incision. You have fluid or blood coming from an incision. You have pus or a bad smell coming from an incision. You cannot eat or drink without vomiting. Get help right away if: You have problems breathing. You are confused. You have trouble speaking. You have severe pain in your abdomen that does not get better with treatment. You have bright red blood in your stool (feces), or your stool is dark black and looks like tar. These symptoms may represent a serious problem that is an emergency. Do not wait to see if the symptoms will go away. Get medical help right away. Call your local emergency services (911 in the [place] Do not drive yourself to the hospital. Summary After the procedure, it is common to have some pain or discomfort in your abdomen, your incision area, or both. You may have to wait 2 weeks after your procedure before you can start peritoneal dialysis treatment. Check your incision area every day for signs of infection. Get medical help right away if you have severe pain in your abdomen that does not get better with treatment. This information is not intended to replace advice given to you by your health care provider. Make sure you discuss any questions you have with your health care provider. Document Revised: 12/30/2019 Document Reviewed: 12/30/2019 Elsevier Patient Education  2023 Elsevier Inc.  

## 2021-12-04 NOTE — Progress Notes (Signed)
[facility] POST-OP OFFICE VISIT  12/04/2021  HPI: [name] [name] is a 61 y.o. male 21 days s/p laparoscopic insertion of continuous peritoneal dialysis catheter and omentopexy with Dr Dahlia Byes.   He is overall doing well Some pain yesterday but otherwise pain free No fever, chills, nausea, emesis He has been doing catheter flushes while awaiting PD; no issue No other complaints   Vital signs: BP (!) 159/76   Pulse 65   Temp 98.7 F (37.1 C) (Oral)   Wt 208 lb 6.4 oz (94.5 kg)   SpO2 96%   BMI 26.76 kg/m    Physical Exam: Constitutional: Well appearing male, NAD Abdomen: Soft, non-tender, non-distended, no rebound/guarding. PD catheter in left abdomen, site CDI, mild amount of fibrinous exudate around catheter Skin: Laparoscopic incisions are healing well, no erythema or drainage   Assessment/Plan: This is a 61 y.o. male 21 days s/p laparoscopic insertion of continuous peritoneal dialysis catheter and omentopexy    - Okay to continue PD use as tolerated; Start PD once established   - Reviewed wound care recommendation  - Reviewed lifting restrictions; 4 weeks total  - He can follow up on as needed basis; He understands to call with questions/concerns  -- Edison Simon, PA-C [facility] 12/04/2021, 1:44 PM M-F: 7am - 4pm

## 2021-12-11 ENCOUNTER — Encounter: Payer: Managed Care, Other (non HMO) | Admitting: Nurse Practitioner

## 2021-12-13 ENCOUNTER — Encounter: Payer: Managed Care, Other (non HMO) | Admitting: Nurse Practitioner

## 2021-12-18 ENCOUNTER — Encounter: Payer: Managed Care, Other (non HMO) | Admitting: Nurse Practitioner

## 2021-12-20 ENCOUNTER — Encounter: Payer: Managed Care, Other (non HMO) | Admitting: Nurse Practitioner

## 2021-12-27 ENCOUNTER — Ambulatory Visit: Payer: Managed Care, Other (non HMO) | Admitting: Dermatology

## 2021-12-31 ENCOUNTER — Ambulatory Visit (INDEPENDENT_AMBULATORY_CARE_PROVIDER_SITE_OTHER): Payer: Managed Care, Other (non HMO) | Admitting: Dermatology

## 2021-12-31 DIAGNOSIS — I872 Venous insufficiency (chronic) (peripheral): Secondary | ICD-10-CM | POA: Diagnosis not present

## 2021-12-31 DIAGNOSIS — R202 Paresthesia of skin: Secondary | ICD-10-CM

## 2021-12-31 NOTE — Progress Notes (Unsigned)
   Follow-Up Visit   Subjective  [name] [name] is a 61 y.o. male who presents for the following: Stasis dermatitis (Of the lower legs - patient stopped using Opzelura last week and states that condition is still present, but it hasn't worsened) and Notalgia paresthetica (Of the back - patient states that he tried OTC Capsaicin but didn't noticed an improvement in condition. Patient states that itching comes and goes ).  The following portions of the chart were reviewed this encounter and updated as appropriate:   Tobacco  Allergies  Meds  Problems  Med Hx  Surg Hx  Fam Hx     Review of Systems:  No other skin or systemic complaints except as noted in HPI or Assessment and Plan.  Objective  Well appearing patient in no apparent distress; mood and affect are within normal limits.  A focused examination was performed including the face and extremities. Relevant physical exam findings are noted in the Assessment and Plan.  B/L leg Less erythema, areas of clearing, erythema, crusting.  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 Condition is symptomatic / bothersome to patient. Improving but not at goal. Stasis in the legs causes chronic leg swelling, which may result in itchy or painful rashes, skin discoloration, skin texture changes, and sometimes ulceration.  Recommend daily graduated compression hose/stockings- easiest to put on first thing in morning, remove at bedtime.  Elevate legs as much as possible. Avoid salt/sodium rich foods.  Continue CeraVe cream moisturizer daily and  Opzelura cream QD-BID PRN.   Notalgia paresthetica Back History of trauma at location of itch as well as neck issues and pinched nerves.  The pinched nerve is likely the reason for his symptoms and notalgia paresthetica.-   Notalgia paresthetica is a chronic condition affecting the skin of the  back in which a pinched nerve along the spine causes itching or changes in sensation in an area of skin. This is usually accompanied by chronic rubbing or scratching often leaving the area of skin discolored and thickened. There is no cure, but there are some treatments which may help control the itch.   Over the counter (non-prescription) treatments for notalgia paresthetica include numbing creams like pramoxine or lidocaine which temporarily reduce itch or Capsaicin-containing creams which cause a burning sensation but which sometimes over time will reset the nerves to stop producing itch.  If you choose to use Capsaicin cream, it is recommended to use it 5 times daily for 1 week followed by 3 times daily for 3-6 weeks. You may have to continue using it long-term. For severe cases, there are some prescription cream or pill options which may help. Other treatment options include: - Transcutaneous Electrical Nerve Stimulation (TENS) - Gabapentin 300-900 mg daily po - Amitriptyline orally - Paravertebral local anesthetic block - intralesional Botulinum toxin A  Discussed prescription compounded skin medicinal itch medication.  Recommend patient try OTC Voltaren gel  Follow-up with primary care physician for back issues.  Related Medications Ruxolitinib Phosphate (OPZELURA) 1.5 % CREA Apply to skin daily, legs  Return in about 1 year (around 01/01/2023) for TBSE.  Luther Redo, CMA, am acting as scribe for Sarina Ser, MD . Documentation: I have reviewed the above documentation for accuracy and completeness, and I agree with the above.  Sarina Ser, MD

## 2021-12-31 NOTE — Patient Instructions (Addendum)
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 This is usually accompanied by chronic rubbing or scratching often leaving the area of skin discolored and thickened. There is no cure, but there are some treatments which may help control the itch.   Over the counter (non-prescription) treatments for notalgia paresthetica include numbing creams like pramoxine or lidocaine which temporarily reduce itch or Capsaicin-containing creams which cause a burning sensation but which sometimes over time will reset the nerves to stop producing itch.  If you choose to use Capsaicin cream, it is recommended to use it 5 times daily for 1 week followed by 3 times daily for 3-6 weeks. You may have to continue using it long-term. For severe cases, there are some prescription cream or pill options which may help. Other treatment options include: - Transcutaneous Electrical Nerve Stimulation (TENS) - Gabapentin 300-900 mg daily po - Amitriptyline orally - Paravertebral local anesthetic block - intralesional Botulinum toxin A  Recommend over the counter Voltaren cream/gel for itching on the back.   Due to recent changes in healthcare laws, you may see results of your pathology and/or laboratory studies on MyChart before the doctors have had a chance to review them. We understand that in some cases there may be results that are confusing or concerning to you. Please understand that not all results are received at the same time and often the doctors may need to interpret multiple results in order to provide you with the best plan of care or course of treatment. Therefore, we ask that you please give [place] 2 business days to thoroughly review all your results before contacting the office for clarification. Should we see a critical lab result, you will be contacted sooner.   If You Need Anything After  Your Visit  If you have any questions or concerns for your doctor, please call our main line at [phone] and press option 4 to reach your doctor's medical assistant. If no one answers, please leave a voicemail as directed and we will return your call as soon as possible. Messages left after 4 pm will be answered the following business day.   You may also send [place] a message via [place]. We typically respond to MyChart messages within 1-2 business days.  For prescription refills, please ask your pharmacy to contact our office. Our fax number is [phone].  If you have an urgent issue when the clinic is closed that cannot wait until the next business day, you can page your doctor at the number below.    Please note that while we do our best to be available for urgent issues outside of office hours, we are not available 24/7.   If you have an urgent issue and are unable to reach [place], you may choose to seek medical care at your doctor's office, retail clinic, urgent care center, or emergency room.  If you have a medical emergency, please immediately call 911 or go to the emergency department.  Pager Numbers  - Dr. Nehemiah Massed: [phone]  - Dr. Laurence Ferrari: [phone]  - Dr. Nicole Kindred: [phone]  In the event of inclement weather, please call our main line at [phone] for an update on the status of any delays or closures.  Dermatology Medication Tips: Please keep the boxes that topical medications come in in order to help keep track of the instructions about where and how to  use these. Pharmacies typically print the medication instructions only on the boxes and not directly on the medication tubes.   If your medication is too expensive, please contact our office at [phone] option 4 or send [place] a message through [place].   We are unable to tell what your co-pay for medications will be in advance as this is different depending on your insurance coverage. However, we may be able to  find a substitute medication at lower cost or fill out paperwork to get insurance to cover a needed medication.   If a prior authorization is required to get your medication covered by your insurance company, please allow [place] 1-2 business days to complete this process.  Drug prices often vary depending on where the prescription is filled and some pharmacies may offer cheaper prices.  The website www.goodrx.com contains coupons for medications through different pharmacies. The prices here do not account for what the cost may be with help from insurance (it may be cheaper with your insurance), but the website can give you the price if you did not use any insurance.  - You can print the associated coupon and take it with your prescription to the pharmacy.  - You may also stop by our office during regular business hours and pick up a GoodRx coupon card.  - If you need your prescription sent electronically to a different pharmacy, notify our office through [facility] or by phone at [phone] option 4.     Si Usted Necesita Algo Despus de Su Visita  Tambin puede enviarnos un mensaje a travs de Pharmacist, community. Por lo general respondemos a los mensajes de MyChart en el transcurso de 1 a 2 das hbiles.  Para renovar recetas, por favor pida a su farmacia que se ponga en contacto con nuestra oficina. Harland Dingwall de fax es [place] [phone].  Si tiene un asunto urgente cuando la clnica est cerrada y que no puede esperar hasta el siguiente da hbil, puede llamar/localizar a su doctor(a) al nmero que aparece a continuacin.   Por favor, tenga en cuenta que aunque hacemos todo lo posible para estar disponibles para asuntos urgentes fuera del horario de [place], no estamos disponibles las 24 horas del da, los 7 das de la [place].   Si tiene un problema urgente y no puede comunicarse con nosotros, puede optar por buscar atencin mdica  en el consultorio de su doctor(a), en una clnica privada,  en un centro de atencin urgente o en una sala de emergencias.  Si tiene Engineering geologist, por favor llame inmediatamente al 911 o vaya a la sala de emergencias.  Nmeros de bper  - Dr. Nehemiah Massed: [phone]  - Dra. Moye: [phone]  - Dra. Nicole Kindred: [phone]  En caso de inclemencias del [place], por favor llame a Johnsie Kindred principal al [phone] para una actualizacin sobre el [place] de cualquier retraso o cierre.  Consejos para la medicacin en dermatologa: Por favor, guarde las cajas en las que vienen los medicamentos de uso tpico para ayudarle a seguir las instrucciones sobre dnde y cmo usarlos. Las farmacias generalmente imprimen las instrucciones del medicamento slo en las cajas y no directamente en los tubos del [place].   Si su medicamento es muy caro, por favor, pngase en contacto con Zigmund Daniel llamando al [phone] y presione la opcin 4 o envenos un mensaje a travs de Pharmacist, community.   No podemos decirle cul ser su copago por los medicamentos por adelantado ya que esto es diferente dependiendo de  la cobertura de su seguro. Sin embargo, es posible que podamos encontrar un medicamento sustituto a Electrical engineer un formulario para que el seguro cubra el medicamento que se considera necesario.   Si se requiere una autorizacin previa para que su compaa de seguros [place] su medicamento, por favor permtanos de 1 a 2 das hbiles para completar este proceso.  Los precios de los medicamentos varan con frecuencia dependiendo del Environmental consultant de dnde se surte la receta y alguna farmacias pueden ofrecer precios ms baratos.  El sitio web www.goodrx.com tiene cupones para medicamentos de Airline pilot. Los precios aqu no tienen en cuenta lo que podra costar con la ayuda del seguro (puede ser ms barato con su seguro), pero el sitio web puede darle el precio si no utiliz Research scientist (physical sciences).  - Puede imprimir el cupn correspondiente y llevarlo con su  receta a la farmacia.  - Tambin puede pasar por nuestra oficina durante el horario de atencin regular y Charity fundraiser una tarjeta de cupones de GoodRx.  - Si necesita que su receta se enve electrnicamente a una farmacia diferente, informe a nuestra oficina a travs de MyChart de [place] o por telfono llamando al [phone] y presione la opcin 4.

## 2022-01-01 ENCOUNTER — Telehealth: Payer: Self-pay | Admitting: Cardiovascular Disease

## 2022-01-01 ENCOUNTER — Encounter: Payer: Self-pay | Admitting: Dermatology

## 2022-01-01 NOTE — Telephone Encounter (Signed)
[facility] called and wanted to know if Dr. Rockey Situ received pre-authorization to insurance for medication Alirocumab (PRALUENT) 150 MG/ML SOAJ. Please call  [phone]; ref # [id number]

## 2022-01-02 ENCOUNTER — Telehealth: Payer: Self-pay | Admitting: *Deleted

## 2022-01-02 NOTE — Telephone Encounter (Signed)
PA has been resubmitted via covermymeds.  Your demographic data has been sent to [facility] successfully!  [employer] typically takes 5-10 minutes to respond, but it may take a little longer in some cases. You will be notified by email when available. You can also check for an update later by opening this request from your dashboard. Please do not fax or call [employer] to resubmit this request. If you need assistance, please chat with CoverMyMeds or call us at [phone].  If it has been longer than 24 hours, please reach out to [place].

## 2022-01-02 NOTE — Telephone Encounter (Signed)
PA has been submitted via covermymeds. Change in insurance.  Your demographic data has been sent to [facility] successfully!  [employer] typically takes 5-10 minutes to respond, but it may take a little longer in some cases. You will be notified by email when available. You can also check for an update later by opening this request from your dashboard. Please do not fax or call [employer] to resubmit this request. If you need assistance, please chat with CoverMyMeds or call us at [phone].  If it has been longer than 24 hours, please reach out to [place].

## 2022-01-02 NOTE — Telephone Encounter (Signed)
Contacted [facility] to check on status of PA request. PA is required they were made aware PA completed and approved back 10/2021 until 10/2022. Pharmacy couldn't verify if pt changed coverage. I will contact [phone].

## 2022-01-03 ENCOUNTER — Encounter: Payer: Self-pay | Admitting: Physician Assistant

## 2022-01-03 ENCOUNTER — Ambulatory Visit (INDEPENDENT_AMBULATORY_CARE_PROVIDER_SITE_OTHER): Payer: Managed Care, Other (non HMO) | Admitting: Physician Assistant

## 2022-01-03 ENCOUNTER — Other Ambulatory Visit: Payer: Self-pay

## 2022-01-03 VITALS — BP 137/76 | HR 71 | Temp 98.4°F | Ht 74.0 in | Wt 205.0 lb

## 2022-01-03 DIAGNOSIS — N186 End stage renal disease: Secondary | ICD-10-CM

## 2022-01-03 DIAGNOSIS — Z992 Dependence on renal dialysis: Secondary | ICD-10-CM

## 2022-01-03 DIAGNOSIS — Z09 Encounter for follow-up examination after completed treatment for conditions other than malignant neoplasm: Secondary | ICD-10-CM

## 2022-01-03 DIAGNOSIS — E1122 Type 2 diabetes mellitus with diabetic chronic kidney disease: Secondary | ICD-10-CM

## 2022-01-03 DIAGNOSIS — N184 Chronic kidney disease, stage 4 (severe): Secondary | ICD-10-CM

## 2022-01-03 NOTE — Patient Instructions (Signed)
I will call you once I hear back from Dr.Pabon about being seen tomorrow.

## 2022-01-03 NOTE — Progress Notes (Signed)
[facility] POST-OP OFFICE VISIT  01/03/2022  HPI: [name] [name] is a 61 y.o. male ~7 weeks s/p laparoscopic insertion of continuous peritoneal dialysis catheter and omentopexy with Dr Dahlia Byes.   Patient seen on 07/11 and doing well aside from mild pains  He presents again today secondary to issues with his PD catheter. Patient and his wife state that about 2 weeks ago he started to have error messages on the machine during the draining phase. He reports that if he is laying on his side or his back, he will get an error message during the draining phase. This only resolves with sitting up. He spoke with his dialysis company and they suggested changing the height of the machine but this did not work. As such, he was referred back to [place] by his dialysis company. He otherwise is doing well, some intermittent soreness. Incisions are healing well but slowly.   Vital signs: BP 137/76   Pulse 71   Temp 98.4 F (36.9 C) (Oral)   Ht 6\' 2"  (1.88 m)   Wt 205 lb 0.4 oz (93 kg)   SpO2 99%   BMI 26.32 kg/m    Physical Exam: Constitutional: Well appearing male, NAD Abdomen: Soft, non-tender, non-distended, no rebound/guarding. PD catheter in left abdomen site CDI. Skin: Laparoscopic incisions are healing well, there is scabbing present on all of them but no erythema or drainage  Assessment/Plan: This is a 61 y.o. male ~7 weeks s/p laparoscopic insertion of continuous peritoneal dialysis catheter and omentopexy with Dr Dahlia Byes.    - Unfortunately, there is not much I can offer at this time. Im not quite clear on all that was tried regarding his PD machine, but it sounds like he was referred back to the clinic for what sounds like discussion regarding possible revision. I do believe he will be best suited to see Dr Dahlia Byes for this. We will attempt to make an appointment for the patient to see Dr Dahlia Byes at the earliest possible. I did my best to answer all questions.   -- Edison Simon,  PA-C [facility] 01/03/2022, 2:14 PM M-F: 7am - 4pm

## 2022-01-04 ENCOUNTER — Ambulatory Visit (INDEPENDENT_AMBULATORY_CARE_PROVIDER_SITE_OTHER): Payer: Managed Care, Other (non HMO) | Admitting: Surgery

## 2022-01-04 ENCOUNTER — Encounter: Payer: Self-pay | Admitting: Surgery

## 2022-01-04 ENCOUNTER — Other Ambulatory Visit: Payer: Self-pay

## 2022-01-04 VITALS — BP 106/64 | HR 63 | Temp 97.9°F | Ht 74.0 in | Wt 205.0 lb

## 2022-01-04 DIAGNOSIS — E1122 Type 2 diabetes mellitus with diabetic chronic kidney disease: Secondary | ICD-10-CM | POA: Diagnosis not present

## 2022-01-04 DIAGNOSIS — Z992 Dependence on renal dialysis: Secondary | ICD-10-CM

## 2022-01-04 DIAGNOSIS — N184 Chronic kidney disease, stage 4 (severe): Secondary | ICD-10-CM

## 2022-01-04 DIAGNOSIS — N186 End stage renal disease: Secondary | ICD-10-CM

## 2022-01-04 NOTE — H&P (View-Only) (Signed)
Outpatient Surgical Follow Up  01/04/2022  [name] [name] is an 61 y.o. male.   Chief Complaint  Patient presents with   Routine Post Op    PD catheter    HPI: [name] [name] is having weeks out from laparoscopic PD catheter placement.  Catheter is functional but he notices that he is got multiple alarms throughout the night.  He is able to complete a full cycle but it takes a couple of hours longer with multiple alarms.  He denies any major abdominal pain no fevers no chills.  He states that when he sits up the drainage seems to be better.  His tire due to lack of sleep due to the alarms. Taking po, having BM, sugars seems to be better  Past Medical History:  Diagnosis Date   Anemia    Arthritis    CAD (coronary artery disease)    a. 12/2016 s/p CABG x 4 (LIMA->LAD, VG->RCA, VG->OM1, VG->D1); b. 02/2018 MV: EF 35%, small-med inferolaterlal infarct. No ischemia.   CKD (chronic kidney disease), stage IV (HCC)    Colon polyps    COPD (chronic obstructive pulmonary disease) (HCC)    Dupre's syndrome    GERD (gastroesophageal reflux disease)    HFrEF (heart failure reduced ejection fraction) ([place])    a.) 2018 EF 35%; b.) 02/2018 EF 50%; c). 02/2019 EF 40-45%; d.) 08/2019 Echo: EF 50-55%, no rwma, GrII DD; e.) 04/2020 Echo: EF 30-35%, mod-sev glob HK. Mild LVH. G2DD. Low-nl RV fxn. Mildly dil LA. Mild MR. Mild-mod Ao sclerosis w/o stenosis; f.) TTE 02/09/2021: EF 55%; septal HK, LAE; G2DD.   History of 2019 novel coronavirus disease (COVID-19) 02/08/2021   Hyperlipidemia    Hypertension    Ischemic cardiomyopathy    a.) 02/2018 MV: EF 35%; b.) 08/2019 Echo: EF 50-55%; c.) 04/2020 Echo: EF 30-35%; d.) TTE 02/09/2021: EF 50-55%.   Lymphedema    Migraine    Myocardial infarction [facility])    Neuropathy    PAD (peripheral artery disease) ([place])    a. 04/2017 Aortoiliac duplex: R iliac dzs; b. 03/2020 LE Duplex: mild bilat atherosclerosis throughout. Patent vessels.   Pneumonia    Retinopathy    S/P  CABG x 4    a.)  4v CABG: LIMA->LAD, SVG->RCA, SVG->OM1, SVG->D1   Sleep apnea    a.) does not require nocturnal PAP therapy   Statin intolerance    Stomach ulcer    T2DM (type 2 diabetes mellitus) ([place])     Past Surgical History:  Procedure Laterality Date   bone graft surgery Bilateral    on both feet x 2   CAPD INSERTION N/A 11/13/2021   Procedure: LAPAROSCOPIC INSERTION CONTINUOUS AMBULATORY PERITONEAL DIALYSIS  (CAPD) CATHETER;  Surgeon: Jules Husbands, MD;  Location: [facility] ORS;  Service: General;  Laterality: N/A;   CARDIAC CATHETERIZATION  2013   S/p PCI    CARDIAC CATHETERIZATION  2018   S/p CABG   CATARACT EXTRACTION Bilateral    CORONARY ARTERY BYPASS GRAFT  2018   (LIMA-LAD,VG-RCA,VG-OM1,VG-D1)   ESOPHAGOGASTRODUODENOSCOPY (EGD) WITH PROPOFOL N/A 04/04/2020   Procedure: ESOPHAGOGASTRODUODENOSCOPY (EGD) WITH PROPOFOL;  Surgeon: Lucilla Lame, MD;  Location: [facility] ENDOSCOPY;  Service: Endoscopy;  Laterality: N/A;   ESOPHAGOGASTRODUODENOSCOPY (EGD) WITH PROPOFOL N/A 06/06/2020   Procedure: ESOPHAGOGASTRODUODENOSCOPY (EGD) WITH PROPOFOL;  Surgeon: Lucilla Lame, MD;  Location: [facility] ENDOSCOPY;  Service: Endoscopy;  Laterality: N/A;   PERIPHERAL VASCULAR CATHETERIZATION Right 10/28/2016   PTA/DEB Right SFA   WRIST SURGERY Left    cyst  Family History  Problem Relation Age of Onset   Diabetes Mother    Heart disease Father     Social History:  reports that he has been smoking cigarettes. He has a 9.75 pack-year smoking history. He has never used smokeless tobacco. He reports current alcohol use. He reports that he does not use drugs.  Allergies:  Allergies  Allergen Reactions   Iodinated Contrast Media Other (See Comments)    Kidney disease  Kidney disease  Kidney disease  Kidney disease   Other Other (See Comments)    Pain  With joint stiffness     Statins Other (See Comments)    Pain  With joint stiffness  Pain  With joint stiffness    Pregabalin Other (See  Comments)    Generalized aches and pains Generalized aches and pains Generalized aches and pains Generalized aches and pains Generalized aches and pains   Sacubitril-Valsartan Other (See Comments)    Hyperkalemia  Hyperkalemia Hyperkalemia  Hyperkalemia   Atorvastatin Other (See Comments)    Muscle aches Muscle aches   Pravastatin Other (See Comments)    Muscle aches Muscle aches   Rosuvastatin Other (See Comments)    Muscle Aches Other reaction(s): JOINT PAIN Other reaction(s): JOINT PAIN Muscle Aches     Medications reviewed.    ROS Full ROS performed and is otherwise negative other than what is stated in HPI   BP 106/64   Pulse 63   Temp 97.9 F (36.6 C) (Oral)   Ht 6\' 2"  (1.88 m)   Wt 205 lb 0.4 oz (93 kg)   SpO2 98%   BMI 26.32 kg/m   Physical Exam Vitals and nursing note reviewed. Exam conducted with a chaperone present.  Constitutional:      General: He is not in acute distress.    Appearance: Normal appearance. He is not ill-appearing or toxic-appearing.  Eyes:     General: No scleral icterus.       Right eye: No discharge.        Left eye: No discharge.  Neck:     Vascular: No carotid bruit.  Cardiovascular:     Rate and Rhythm: Normal rate and regular rhythm.     Heart sounds: No murmur heard. Pulmonary:     Effort: Pulmonary effort is normal. No respiratory distress.     Breath sounds: Normal breath sounds. No stridor. No wheezing or rhonchi.  Abdominal:     General: Abdomen is flat. There is no distension.     Palpations: Abdomen is soft. There is no mass.     Tenderness: There is no abdominal tenderness. There is no guarding or rebound. Incisions healing very well, no infection. Catheter intact w/o infection    Hernia: No hernia is present.  Musculoskeletal:        General: No swelling or tenderness. Normal range of motion.     Cervical back: Normal range of motion and neck supple. No rigidity or tenderness.  Lymphadenopathy:      Cervical: No cervical adenopathy.  Skin:    General: Skin is warm and dry.     Capillary Refill: Capillary refill takes less than 2 seconds.  Neurological:     General: No focal deficit present.     Mental Status: He is alert and oriented to person, place, and time.  Psychiatric:        Mood and Affect: Mood normal.        Behavior: Behavior normal.  Thought Content: Thought content normal.        Judgment: Judgment normal.    Assessment/Plan: 61 year old male with some logistical issues related to the peritoneal dialysis. Gust with patient in detail.  Impossible to say whether revision will be beneficial or not.  I will be happy to perform diagnostic laparoscopy and revise the catheter but I made no guarantees.  He understands that peritoneal dialysis is a very imperfect process.  I Talked with him in detail regarding the procedure.  Risk, benefits and possible implications including but not limited to: Bleeding, infection catheter malfunction, bowel injuries.  He understands and wishes to proceed. Please note I spent 30 minutes in this encounter including personally reviewing medical records, personally reviewing imaging studies, placing orders, counseling the patient and performing appropriate documentation.     Caroleen Hamman, MD [facility] General Surgeon

## 2022-01-04 NOTE — Progress Notes (Signed)
Outpatient Surgical Follow Up  01/04/2022  [name] [name] is an 61 y.o. male.   Chief Complaint  Patient presents with   Routine Post Op    PD catheter    HPI: [name] [name] is having weeks out from laparoscopic PD catheter placement.  Catheter is functional but he notices that he is got multiple alarms throughout the night.  He is able to complete a full cycle but it takes a couple of hours longer with multiple alarms.  He denies any major abdominal pain no fevers no chills.  He states that when he sits up the drainage seems to be better.  His tire due to lack of sleep due to the alarms. Taking po, having BM, sugars seems to be better  Past Medical History:  Diagnosis Date   Anemia    Arthritis    CAD (coronary artery disease)    a. 12/2016 s/p CABG x 4 (LIMA->LAD, VG->RCA, VG->OM1, VG->D1); b. 02/2018 MV: EF 35%, small-med inferolaterlal infarct. No ischemia.   CKD (chronic kidney disease), stage IV (HCC)    Colon polyps    COPD (chronic obstructive pulmonary disease) (HCC)    Dupre's syndrome    GERD (gastroesophageal reflux disease)    HFrEF (heart failure reduced ejection fraction) ([place])    a.) 2018 EF 35%; b.) 02/2018 EF 50%; c). 02/2019 EF 40-45%; d.) 08/2019 Echo: EF 50-55%, no rwma, GrII DD; e.) 04/2020 Echo: EF 30-35%, mod-sev glob HK. Mild LVH. G2DD. Low-nl RV fxn. Mildly dil LA. Mild MR. Mild-mod Ao sclerosis w/o stenosis; f.) TTE 02/09/2021: EF 55%; septal HK, LAE; G2DD.   History of 2019 novel coronavirus disease (COVID-19) 02/08/2021   Hyperlipidemia    Hypertension    Ischemic cardiomyopathy    a.) 02/2018 MV: EF 35%; b.) 08/2019 Echo: EF 50-55%; c.) 04/2020 Echo: EF 30-35%; d.) TTE 02/09/2021: EF 50-55%.   Lymphedema    Migraine    Myocardial infarction [facility])    Neuropathy    PAD (peripheral artery disease) ([place])    a. 04/2017 Aortoiliac duplex: R iliac dzs; b. 03/2020 LE Duplex: mild bilat atherosclerosis throughout. Patent vessels.   Pneumonia    Retinopathy    S/P  CABG x 4    a.)  4v CABG: LIMA->LAD, SVG->RCA, SVG->OM1, SVG->D1   Sleep apnea    a.) does not require nocturnal PAP therapy   Statin intolerance    Stomach ulcer    T2DM (type 2 diabetes mellitus) ([place])     Past Surgical History:  Procedure Laterality Date   bone graft surgery Bilateral    on both feet x 2   CAPD INSERTION N/A 11/13/2021   Procedure: LAPAROSCOPIC INSERTION CONTINUOUS AMBULATORY PERITONEAL DIALYSIS  (CAPD) CATHETER;  Surgeon: [name] Husbands, MD;  Location: [facility] ORS;  Service: General;  Laterality: N/A;   CARDIAC CATHETERIZATION  2013   S/p PCI    CARDIAC CATHETERIZATION  2018   S/p CABG   CATARACT EXTRACTION Bilateral    CORONARY ARTERY BYPASS GRAFT  2018   (LIMA-LAD,VG-RCA,VG-OM1,VG-D1)   ESOPHAGOGASTRODUODENOSCOPY (EGD) WITH PROPOFOL N/A 04/04/2020   Procedure: ESOPHAGOGASTRODUODENOSCOPY (EGD) WITH PROPOFOL;  Surgeon: Lucilla Lame, MD;  Location: [facility] ENDOSCOPY;  Service: Endoscopy;  Laterality: N/A;   ESOPHAGOGASTRODUODENOSCOPY (EGD) WITH PROPOFOL N/A 06/06/2020   Procedure: ESOPHAGOGASTRODUODENOSCOPY (EGD) WITH PROPOFOL;  Surgeon: Lucilla Lame, MD;  Location: [facility] ENDOSCOPY;  Service: Endoscopy;  Laterality: N/A;   PERIPHERAL VASCULAR CATHETERIZATION Right 10/28/2016   PTA/DEB Right SFA   WRIST SURGERY Left    cyst  Family History  Problem Relation Age of Onset   Diabetes Mother    Heart disease Father     Social History:  reports that he has been smoking cigarettes. He has a 9.75 pack-year smoking history. He has never used smokeless tobacco. He reports current alcohol use. He reports that he does not use drugs.  Allergies:  Allergies  Allergen Reactions   Iodinated Contrast Media Other (See Comments)    Kidney disease  Kidney disease  Kidney disease  Kidney disease   Other Other (See Comments)    Pain  With joint stiffness     Statins Other (See Comments)    Pain  With joint stiffness  Pain  With joint stiffness    Pregabalin Other (See  Comments)    Generalized aches and pains Generalized aches and pains Generalized aches and pains Generalized aches and pains Generalized aches and pains   Sacubitril-Valsartan Other (See Comments)    Hyperkalemia  Hyperkalemia Hyperkalemia  Hyperkalemia   Atorvastatin Other (See Comments)    Muscle aches Muscle aches   Pravastatin Other (See Comments)    Muscle aches Muscle aches   Rosuvastatin Other (See Comments)    Muscle Aches Other reaction(s): JOINT PAIN Other reaction(s): JOINT PAIN Muscle Aches     Medications reviewed.    ROS Full ROS performed and is otherwise negative other than what is stated in HPI   BP 106/64   Pulse 63   Temp 97.9 F (36.6 C) (Oral)   Ht 6\' 2"  (1.88 m)   Wt 205 lb 0.4 oz (93 kg)   SpO2 98%   BMI 26.32 kg/m   Physical Exam Vitals and nursing note reviewed. Exam conducted with a chaperone present.  Constitutional:      General: He is not in acute distress.    Appearance: Normal appearance. He is not ill-appearing or toxic-appearing.  Eyes:     General: No scleral icterus.       Right eye: No discharge.        Left eye: No discharge.  Neck:     Vascular: No carotid bruit.  Cardiovascular:     Rate and Rhythm: Normal rate and regular rhythm.     Heart sounds: No murmur heard. Pulmonary:     Effort: Pulmonary effort is normal. No respiratory distress.     Breath sounds: Normal breath sounds. No stridor. No wheezing or rhonchi.  Abdominal:     General: Abdomen is flat. There is no distension.     Palpations: Abdomen is soft. There is no mass.     Tenderness: There is no abdominal tenderness. There is no guarding or rebound. Incisions healing very well, no infection. Catheter intact w/o infection    Hernia: No hernia is present.  Musculoskeletal:        General: No swelling or tenderness. Normal range of motion.     Cervical back: Normal range of motion and neck supple. No rigidity or tenderness.  Lymphadenopathy:      Cervical: No cervical adenopathy.  Skin:    General: Skin is warm and dry.     Capillary Refill: Capillary refill takes less than 2 seconds.  Neurological:     General: No focal deficit present.     Mental Status: He is alert and oriented to person, place, and time.  Psychiatric:        Mood and Affect: Mood normal.        Behavior: Behavior normal.  Thought Content: Thought content normal.        Judgment: Judgment normal.    Assessment/Plan: 61 year old male with some logistical issues related to the peritoneal dialysis. Gust with patient in detail.  Impossible to say whether revision will be beneficial or not.  I will be happy to perform diagnostic laparoscopy and revise the catheter but I made no guarantees.  He understands that peritoneal dialysis is a very imperfect process.  I Talked with him in detail regarding the procedure.  Risk, benefits and possible implications including but not limited to: Bleeding, infection catheter malfunction, bowel injuries.  He understands and wishes to proceed. Please note I spent 30 minutes in this encounter including personally reviewing medical records, personally reviewing imaging studies, placing orders, counseling the patient and performing appropriate documentation.     Caroleen Hamman, MD [facility] General Surgeon

## 2022-01-04 NOTE — Patient Instructions (Signed)
Our surgery scheduler will call you within 24-48 hours to schedule your surgery. Please have the Blue surgery sheet available when speaking with her.  

## 2022-01-07 ENCOUNTER — Telehealth: Payer: Self-pay | Admitting: Surgery

## 2022-01-07 NOTE — Telephone Encounter (Signed)
Patient notified of surgery information.

## 2022-01-07 NOTE — Telephone Encounter (Signed)
Outgoing call.  Please inform patient of the following regarding scheduled surgery:   Pre-Admission date/time, and Surgery date.  Surgery Date: 01/10/22 Preadmission Testing Date: 01/09/22 (phone 8a-1p)  Also patient will need to call at [phone], between 1-3:00pm the day before surgery, to find out what time to arrive for surgery.

## 2022-01-07 NOTE — Telephone Encounter (Signed)
PT has been denied for Praluent 150 mg/ml. Plan covers drug when pt meets one of these options: A) Tried one other drug plan covers for preferred drugs and didn't work well B) Doctor gives medical reason why pt can't take preferred drug. Pt may need to try one preferred drug. Pt's preferred drug is Repatha.  Denial has been placed in nurses bin for appeal or new Rx.

## 2022-01-09 ENCOUNTER — Encounter
Admission: RE | Admit: 2022-01-09 | Discharge: 2022-01-09 | Disposition: A | Discharge status: Home / Self Care | Payer: Managed Care, Other (non HMO) | Source: Ambulatory Visit | Attending: Surgery | Admitting: Surgery

## 2022-01-09 ENCOUNTER — Telehealth: Payer: Self-pay | Admitting: Cardiovascular Disease

## 2022-01-09 DIAGNOSIS — Z01818 Encounter for other preprocedural examination: Secondary | ICD-10-CM

## 2022-01-09 MED ORDER — ORAL CARE MOUTH RINSE: 15.0000 mL | Freq: Once | OROMUCOSAL | Status: AC

## 2022-01-09 MED ORDER — CHLORHEXIDINE GLUCONATE CLOTH 2 % EX PADS
6.0000 | MEDICATED_PAD | Freq: Once | CUTANEOUS | Status: AC
  Administered 2022-01-10: 6 via TOPICAL

## 2022-01-09 MED ORDER — GABAPENTIN 300 MG PO CAPS
300.0000 mg | ORAL_CAPSULE | ORAL | Status: AC
  Administered 2022-01-10: 300 mg via ORAL

## 2022-01-09 MED ORDER — CEFAZOLIN SODIUM-DEXTROSE 2-4 GM/100ML-% IV SOLN
2.0000 g | INTRAVENOUS | Status: AC
  Administered 2022-01-10: 2 g via INTRAVENOUS

## 2022-01-09 MED ORDER — CHLORHEXIDINE GLUCONATE CLOTH 2 % EX PADS: 6.0000 | MEDICATED_PAD | Freq: Once | CUTANEOUS | Status: DC

## 2022-01-09 MED ORDER — REPATHA SURECLICK 140 MG/ML ~~LOC~~ SOAJ: 1.0000 | SUBCUTANEOUS | 6 refills | Status: DC

## 2022-01-09 MED ORDER — CHLORHEXIDINE GLUCONATE 0.12 % MT SOLN
15.0000 mL | Freq: Once | OROMUCOSAL | Status: AC
  Administered 2022-01-10: 15 mL via OROMUCOSAL

## 2022-01-09 MED ORDER — ACETAMINOPHEN 500 MG PO TABS
1000.0000 mg | ORAL_TABLET | ORAL | Status: AC
  Administered 2022-01-10: 1000 mg via ORAL

## 2022-01-09 MED ORDER — SODIUM CHLORIDE 0.9 % IV SOLN: INTRAVENOUS | Status: DC

## 2022-01-09 NOTE — Progress Notes (Signed)
Perioperative Services  Pre-Admission/Anesthesia Testing Clinical Review  Date: 01/09/22  Patient Demographics:  Name: [name] [name] DOB:   11-28-1960 MRN:   [id number]  Planned Surgical Procedure(s):    Case: [id number] Date/Time: 01/10/22 1407   Procedure: LAPAROSCOPIC REVISION CONTINUOUS AMBULATORY PERITONEAL DIALYSIS  (CAPD) CATHETER   Anesthesia type: General   Pre-op diagnosis: CKD   Location: [facility] OR ROOM 06 / [facility] ORS FOR ANESTHESIA GROUP   Surgeons: Jules Husbands, MD   NOTE: Available PAT nursing documentation and vital signs have been reviewed. Clinical nursing staff has updated patient's PMH/PSHx, current medication list, and drug allergies/intolerances to ensure comprehensive history available to assist in medical decision making as it pertains to the aforementioned surgical procedure and anticipated anesthetic course. Extensive review of available clinical information performed. [place] PMH and PSHx updated with any diagnoses/procedures that  may have been inadvertently omitted during his intake with the pre-admission testing department's nursing staff.  Clinical Discussion:  [name] [name] is a 61 y.o. male who is submitted for pre-surgical anesthesia review and clearance prior to him undergoing the above procedure. Patient is a Current Smoker (9.75 pack years). Pertinent PMH includes: CAD (s/p CABG), MI, ischemic cardiomyopathy, HFrEF, PAD, HTN, HLD, T2DM, CKD-IV, GERD (on daily PPI), gastric ulcer, OSAH (does not require nocturnal PAP therapy), COPD, anemia, neuropathy, lymphedema.  Patient is followed by cardiology Rockey Situ, MD). He was last seen in the cardiology clinic on 11/09/2021; notes reviewed.  At the time of this clinic visit, patient doing well overall from a cardiovascular perspective.  He denied any episodes of chest pain, shortness of breath, PND, orthopnea, palpitations, significant peripheral edema, vertiginous symptoms, or presyncope/syncope.  Patient  with a past medical history significant for cardiovascular diagnoses.  Of note, patient has received care outside of the state of [place], therefore complete records regarding cardiovascular history not available for review at time of consult.  Information gathered from patient and notes from local cardiologist.  Patient reported to have suffered an MI in the past; details unknown.  Patient underwent a four-vessel CABG in 12/2016.  LIMA-LAD, SVG-RCA, SVG-OM1, and SVG-D1 bypass grafts were placed.  Given patient's history of and ischemic cardiomyopathy and associated HFrEF, left ventricular function has been serially monitored.  Last TTE was performed on 02/09/2021 revealing a normal left ventricular systolic function with an EF of 50-55%.  There was septal wall hypokinesis noted. Diastolic Doppler parameters consistent with pseudonormalization (G2DD).  Left atrium was mildly dilated.  No valvular regurgitation noted.  There was no evidence of a significant transvalvular gradient to suggest stenosis.  Patient with a known history of PAD.  Last vascular ABI/TBI study was done on 04/10/2021 revealing no significant RIGHT lower extremity disease with only mild disease noted contralaterally.  Blood pressure well controlled at 120/64 on currently prescribed beta-blocker, nitrate, ARB, and diuretic therapies.  Patient jointly managed in the [place] heart failure clinic by [facility], NP-C. Patient is statin intolerant, therefore he is being treated with ezetimibe + PCSK9i (alirocumab) for his HLD and further ASCVD prevention.  T2DM poorly controlled on currently prescribed regimen; last HgbA1c was 10.3% when checked on 09/12/2021.  Patient has an OSAH diagnosis, however he reported that nocturnal PAP therapy "did not work for him".  Functional capacity, as defined by DASI, is documented as being >/= 4 METS.  No changes were made to his medication regimen.  Patient to follow-up with outpatient  cardiology in 6 months or sooner if needed.  [name] [name] is scheduled for  an [place]  (CAPD) CATHETER on 01/10/2022 with Dr. Caroleen Hamman, MD. Given patient's past medical history significant for cardiovascular diagnoses, presurgical cardiac clearance was sought by the PAT team. Per cardiology, "based ACC/AHA guidelines, the patient's past medical history, and the amount of time since his last clinic visit, this patient would be at an overall ACCEPTABLE risk for the planned procedure without further cardiovascular testing or intervention at this time". In review of his medication reconciliation, it is noted the patient is on daily antiplatelet therapy. He has been instructed on recommendations from his cardiologist for holding his daily low-dose ASA for 5 days prior to his procedure with plans to restart as soon as postoperative bleeding risk felt to be minimized by his primary attending surgeon. The patient is aware that his last dose of ASA should be on 11/07/2021.  Patient denies previous perioperative complications with anesthesia in the past. In review of the available records, it is noted that patient underwent a general anesthetic course here at [facility] (ASA III) in 10/2021 without documented complications.      01/04/2022   10:05 AM 01/03/2022    1:56 PM 12/04/2021    1:34 PM  Vitals with BMI  Height 6' 2"  6' 2"    Weight 205 lbs 205 lbs 208 lbs 6 oz  BMI 26.31 84.13 24.40  Systolic 102 725 366  Diastolic 64 76 76  Pulse 63 71 65    Providers/Specialists:   NOTE: Primary physician provider listed below. Patient may have been seen by APP or partner within same practice.   PROVIDER ROLE / SPECIALTY LAST Suszanne Finch, MD General Surgery (Surgeon) 01/04/2022  [name], MD Primary Care Provider 04/17/2021  Ida Rogue, MD Cardiology 11/09/2021  Darylene Price, NP-C Heart Failure  05/15/2021  Murlean Iba, MD Nephrology 10/16/2021  Jennings Books, MD Neurology 11/02/2021  Francetta Found, MD Endocrinology 01/08/2022   Allergies:  Iodinated contrast media, Other, Statins, Pregabalin, Sacubitril-valsartan, Atorvastatin, Pravastatin, and Rosuvastatin  Current Home Medications:   No current facility-administered medications for this encounter.    aspirin EC 81 MG tablet   carvedilol (COREG) 12.5 MG tablet   cholecalciferol (VITAMIN D3) 25 MCG (1000 UNIT) tablet   docusate calcium (SURFAK) 240 MG capsule   Evolocumab (REPATHA SURECLICK) 440 MG/ML SOAJ   ezetimibe (ZETIA) 10 MG tablet   HYDROcodone-acetaminophen (NORCO/VICODIN) 5-325 MG tablet   Insulin Pen Needle (B-D ULTRAFINE III SHORT PEN) 31G X 8 MM MISC   isosorbide mononitrate (IMDUR) 30 MG 24 hr tablet   losartan (COZAAR) 25 MG tablet   nortriptyline (PAMELOR) 10 MG capsule   NOVOLOG FLEXPEN 100 UNIT/ML FlexPen   pantoprazole (PROTONIX) 40 MG tablet   Ruxolitinib Phosphate (OPZELURA) 1.5 % CREA   torsemide (DEMADEX) 20 MG tablet   TRESIBA FLEXTOUCH 200 UNIT/ML FlexTouch Pen   History:   Past Medical History:  Diagnosis Date   Anemia    Arthritis    CAD (coronary artery disease)    a. 12/2016 s/p CABG x 4 (LIMA->LAD, VG->RCA, VG->OM1, VG->D1); b. 02/2018 MV: EF 35%, small-med inferolaterlal infarct. No ischemia.   CKD (chronic kidney disease), stage IV (HCC)    Colon polyps    COPD (chronic obstructive pulmonary disease) (HCC)    Dupre's syndrome    GERD (gastroesophageal reflux disease)    HFrEF (heart failure reduced ejection fraction) ([place])    a.) 2018 EF 35%; b.) 02/2018 EF 50%; c). 02/2019  EF 40-45%; d.) 08/2019 Echo: EF 50-55%, no rwma, GrII DD; e.) 04/2020 Echo: EF 30-35%, mod-sev glob HK. Mild LVH. G2DD. Low-nl RV fxn. Mildly dil LA. Mild MR. Mild-mod Ao sclerosis w/o stenosis; f.) TTE 02/09/2021: EF 55%; septal HK, LAE; G2DD.   History of 2019 novel coronavirus disease (COVID-19) 02/08/2021    Hyperlipidemia    Hypertension    Ischemic cardiomyopathy    a.) 02/2018 MV: EF 35%; b.) 08/2019 Echo: EF 50-55%; c.) 04/2020 Echo: EF 30-35%; d.) TTE 02/09/2021: EF 50-55%.   Lymphedema    Migraine    Myocardial infarction [facility])    Neuropathy    PAD (peripheral artery disease) ([place])    a. 04/2017 Aortoiliac duplex: R iliac dzs; b. 03/2020 LE Duplex: mild bilat atherosclerosis throughout. Patent vessels.   Pneumonia    Retinopathy    S/P CABG x 4    a.)  4v CABG: LIMA->LAD, SVG->RCA, SVG->OM1, SVG->D1   Sleep apnea    a.) does not require nocturnal PAP therapy   Statin intolerance    Stomach ulcer    T2DM (type 2 diabetes mellitus) ([place])    Past Surgical History:  Procedure Laterality Date   bone graft surgery Bilateral    on both feet x 2   CAPD INSERTION N/A 11/13/2021   Procedure: LAPAROSCOPIC INSERTION CONTINUOUS AMBULATORY PERITONEAL DIALYSIS  (CAPD) CATHETER;  Surgeon: Jules Husbands, MD;  Location: [facility] ORS;  Service: General;  Laterality: N/A;   CARDIAC CATHETERIZATION  2013   S/p PCI    CARDIAC CATHETERIZATION  2018   S/p CABG   CATARACT EXTRACTION Bilateral    CORONARY ARTERY BYPASS GRAFT  2018   (LIMA-LAD,VG-RCA,VG-OM1,VG-D1)   ESOPHAGOGASTRODUODENOSCOPY (EGD) WITH PROPOFOL N/A 04/04/2020   Procedure: ESOPHAGOGASTRODUODENOSCOPY (EGD) WITH PROPOFOL;  Surgeon: Lucilla Lame, MD;  Location: [facility] ENDOSCOPY;  Service: Endoscopy;  Laterality: N/A;   ESOPHAGOGASTRODUODENOSCOPY (EGD) WITH PROPOFOL N/A 06/06/2020   Procedure: ESOPHAGOGASTRODUODENOSCOPY (EGD) WITH PROPOFOL;  Surgeon: Lucilla Lame, MD;  Location: [facility] ENDOSCOPY;  Service: Endoscopy;  Laterality: N/A;   PERIPHERAL VASCULAR CATHETERIZATION Right 10/28/2016   PTA/DEB Right SFA   WRIST SURGERY Left    cyst   Family History  Problem Relation Age of Onset   Diabetes Mother    Heart disease Father    Social History   Tobacco Use   Smoking status: Every Day    Packs/day: 0.25    Years: 39.00    Total pack  years: 9.75    Types: Cigarettes   Smokeless tobacco: Never  Vaping Use   Vaping Use: Never used  Substance Use Topics   Alcohol use: Yes    Comment: very rarely - wine   Drug use: Never    Pertinent Clinical Results:  LABS: Labs reviewed: Acceptable for surgery.   Ref Range & Units 11/12/2021  WBC 3.8 - 10.8 Thousand/uL 13.4 High    RBC 4.20 - 5.80 Million/uL 4.60   Hemoglobin 13.2 - 17.1 g/dL 14.5   Hematocrit 38.5 - 50.0 % 42.3   MCV 80.0 - 100.0 fL 92.0   MCH 27.0 - 33.0 pg 31.5   MCHC 32.0 - 36.0 g/dL 34.3   RDW 11.0 - 15.0 % 13.2   Platelets 140 - 400 Thousand/uL 301   MPV 7.5 - 12.5 fL 9.7   Specimen Collected: 11/12/21 09:45   Performed by: Yvetta Coder Last Resulted: 11/16/21 23:57  Received From: [facility]  Result Received: 11/20/21 11:57    Ref Range & Units 11/12/2021  Glucose  65 - 99 mg/dL 222 High    BUN 7 - 25 mg/dL 56 High    Creatinine 0.70 - 1.35 mg/dL 3.48 High    eGFR CKD-EPI CR 2021 > OR = 60 mL/min/1.71m 19 Low    BUN/Creatinine Ratio 6 - 22 (calc) 16   Sodium 135 - 146 mmol/L 135   Potassium 3.5 - 5.3 mmol/L 4.9   Chloride 98 - 110 mmol/L 99   Bicarbonate (CO2) 20 - 32 mmol/L 24   Calcium 8.6 - 10.3 mg/dL 9.0   Phosphorus 2.5 - 4.5 mg/dL 5.1 High    Albumin 3.6 - 5.1 g/dL 3.6   Specimen Collected: 11/12/21 09:45   Performed by: QYvetta CoderLast Resulted: 11/16/21 23:57  Received From: [facility]  Result Received: 11/20/21 11:57    ECG: Date: 11/09/2021 Time ECG obtained: 1400 PM Rate: 70 bpm Rhythm: normal sinus Axis (leads I and aVF): Normal Intervals: PR 172 ms. QRS 110 ms. QTc 451 ms. ST segment and T wave changes: Lateral ST and T wave abnormalities.  Evidence of an age undetermined inferior infarct present.   Comparison: Similar to previous tracing obtained on 03/13/2021   IMAGING / PROCEDURES: VAS [place]ABI WITH/WO TBI performed on 04/10/2021 Right: Resting right ankle-brachial index is within normal range. No  evidence of significant right lower extremity arterial disease. The right toe-brachial index is mildly abnormal.  Left: Resting left ankle-brachial index indicates mild left lower extremity arterial disease. The left toe-brachial index is mildly abnormal.   TRANSTHORACIC ECHOCARDIOGRAM performed on 02/09/2021 Left ventricular ejection fraction, by estimation, is 50 to 55%. The left ventricle has low normal function. The left ventricle demonstrates regional wall motion abnormalities (Septal wall hypokinesis consistent  with postoperative state.). Left  ventricular diastolic parameters are consistent with Grade II diastolic dysfunction (pseudonormalization).  Right ventricular systolic function is normal. The right ventricular size is normal. Tricuspid regurgitation signal is inadequate for assessing PA pressure.  Left atrial size was mildly dilated.   Impression and Plan:  [name] [name] for pre-anesthesia review and clearance prior to him undergoing the planned anesthetic and procedural courses. Available labs, pertinent testing, and imaging results were personally reviewed by me. This patient has been appropriately cleared by cardiology with an overall ACCEPTABLE risk of significant perioperative cardiovascular complications.  Based on clinical review performed today (01/09/22), barring any significant acute changes in the patient's overall condition, it is anticipated that he will be able to proceed with the planned surgical intervention. Any acute changes in clinical condition may necessitate his procedure being postponed and/or cancelled. Patient will meet with anesthesia team (MD and/or CRNA) on the day of his procedure for preoperative evaluation/assessment. Questions regarding anesthetic course will be fielded at that time.   Pre-surgical instructions were reviewed with the patient during his PAT appointment and questions were fielded by PAT clinical staff. Patient was advised  that if any questions or concerns arise prior to his procedure then he should return a call to PAT and/or his surgeon's office to discuss.  BHonor Loh MSN, APRN, FNP-C, CEN [facility] Peri-operative Services Nurse Practitioner Phone: [phone]: [phone]/16/23 2:19 PM  NOTE: This note has been prepared using Dragon dictation software. Despite my best ability to proofread, there is always the potential that unintentional transcriptional errors may still occur from this process.

## 2022-01-09 NOTE — Telephone Encounter (Signed)
Pt c/o medication issue:  1. Name of Medication: Alirocumab (PRALUENT) 150 MG/ML SOAJ  2. How are you currently taking this medication (dosage and times per day)?  Inject 1 pen into the skin every 14 (fourteen) days. 3. Are you having a reaction (difficulty breathing--STAT)? NO  4. What is your medication issue? Pharmacy calling to see in an appeal will be filed for medication. Please advise

## 2022-01-09 NOTE — Patient Instructions (Addendum)
Your procedure is scheduled on:01-10-22 Thursday Report to the Registration Desk on the 1st floor of the [facility].Then proceed to the 2nd floor Surgery Desk To find out your arrival time, please call [phone] between 1PM - 3PM on:01-09-22 Wednesday If your arrival time is 6:00 am, do not arrive prior to that time as the [address] entrance doors do not open until 6:00 am.  REMEMBER: Instructions that are not followed completely may result in serious medical risk, up to and including death; or upon the discretion of your surgeon and anesthesiologist your surgery may need to be rescheduled.  Do not eat food after midnight the night before surgery.  No gum chewing, lozengers or hard candies.  You may however, drink Water up to 2 hours before you are scheduled to arrive for your surgery. Do not drink anything within 2 hours of your scheduled arrival time.  TAKE THESE MEDICATIONS THE MORNING OF SURGERY WITH A SIP OF WATER: -carvedilol (COREG) -isosorbide mononitrate (IMDUR) -pantoprazole (PROTONIX)  Take half of your TRESIBA tonight (50 units) and NO Insulin the morning of surgery  Last dose of 81 mg Aspirin was on 01-09-22  One week prior to surgery: Stop Anti-inflammatories (NSAIDS) such as Advil, Aleve, Ibuprofen, Motrin, Naproxen, Naprosyn and Aspirin based products such as Excedrin, Goodys Powder, BC Powder. You may however, continue to take Tylenol/Hydrocodone if needed for pain up until the day of surgery.  No Alcohol for 24 hours before or after surgery.  No Smoking including e-cigarettes for 24 hours prior to surgery.  No chewable tobacco products for at least 6 hours prior to surgery.  No nicotine patches on the day of surgery.  Do not use any "recreational" drugs for at least a week prior to your surgery.  Please be advised that the combination of cocaine and anesthesia may have negative outcomes, up to and including death. If you test positive for cocaine, your  surgery will be cancelled.  On the morning of surgery brush your teeth with toothpaste and water, you may rinse your mouth with mouthwash if you wish. Do not swallow any toothpaste or mouthwash.  Do not wear jewelry, make-up, hairpins, clips or nail polish.  Do not wear lotions, powders, or perfumes.   Do not shave body from the neck down 48 hours prior to surgery just in case you cut yourself which could leave a site for infection.  Also, freshly shaved skin may become irritated if using the CHG soap.  Contact lenses, hearing aids and dentures may not be worn into surgery.  Do not bring valuables to the hospital. [facility] is not responsible for any missing/lost belongings or valuables. .   Notify your doctor if there is any change in your medical condition (cold, fever, infection).  Wear comfortable clothing (specific to your surgery type) to the hospital.  After surgery, you can help prevent lung complications by doing breathing exercises.  Take deep breaths and cough every 1-2 hours. Your doctor may order a device called an Incentive Spirometer to help you take deep breaths. When coughing or sneezing, hold a pillow firmly against your incision with both hands. This is called "splinting." Doing this helps protect your incision. It also decreases belly discomfort.  If you are being admitted to the hospital overnight, leave your suitcase in the car. After surgery it may be brought to your room.  If you are being discharged the day of surgery, you will not be allowed to drive home. You will need  a responsible adult (18 years or older) to drive you home and stay with you that night.   If you are taking public transportation, you will need to have a responsible adult (18 years or older) with you. Please confirm with your physician that it is acceptable to use public transportation.   Please call the [facility]. at [phone] if you have any questions about these  instructions.  Surgery Visitation Policy:  Patients undergoing a surgery or procedure may have two family members or support persons with them as long as the person is not COVID-19 positive or experiencing its symptoms.   Inpatient Visitation:    Visiting hours are 7 a.m. to 8 p.m. Up to four visitors are allowed at one time in a patient room, including children. The visitors may rotate out with other people during the day. One designated support person (adult) may remain overnight.

## 2022-01-09 NOTE — Telephone Encounter (Signed)
Per fax received from [employer], [place] for [employer] was denied, preferred drug is [place]. Rx sent in for [place], pharmacy notified.

## 2022-01-09 NOTE — Addendum Note (Signed)
Addended by: Caroleen Hamman F on: 01/09/2022 01:19 PM   Modules accepted: Orders

## 2022-01-10 ENCOUNTER — Encounter
Admission: RE | Disposition: A | Discharge status: Home / Self Care | Payer: Self-pay | Source: Home / Self Care | Attending: Surgery

## 2022-01-10 ENCOUNTER — Other Ambulatory Visit: Payer: Self-pay

## 2022-01-10 ENCOUNTER — Encounter: Payer: Self-pay | Admitting: Surgery

## 2022-01-10 ENCOUNTER — Ambulatory Visit: Payer: Managed Care, Other (non HMO) | Admitting: Urgent Care

## 2022-01-10 ENCOUNTER — Ambulatory Visit
Admission: RE | Admit: 2022-01-10 | Discharge: 2022-01-10 | Disposition: A | Discharge status: Home / Self Care | Payer: Managed Care, Other (non HMO) | Attending: Surgery | Admitting: Surgery

## 2022-01-10 DIAGNOSIS — E1140 Type 2 diabetes mellitus with diabetic neuropathy, unspecified: Secondary | ICD-10-CM | POA: Diagnosis not present

## 2022-01-10 DIAGNOSIS — Z951 Presence of aortocoronary bypass graft: Secondary | ICD-10-CM | POA: Insufficient documentation

## 2022-01-10 DIAGNOSIS — I252 Old myocardial infarction: Secondary | ICD-10-CM | POA: Insufficient documentation

## 2022-01-10 DIAGNOSIS — F17210 Nicotine dependence, cigarettes, uncomplicated: Secondary | ICD-10-CM | POA: Insufficient documentation

## 2022-01-10 DIAGNOSIS — E1151 Type 2 diabetes mellitus with diabetic peripheral angiopathy without gangrene: Secondary | ICD-10-CM | POA: Diagnosis not present

## 2022-01-10 DIAGNOSIS — G4730 Sleep apnea, unspecified: Secondary | ICD-10-CM | POA: Insufficient documentation

## 2022-01-10 DIAGNOSIS — Y828 Other medical devices associated with adverse incidents: Secondary | ICD-10-CM | POA: Diagnosis not present

## 2022-01-10 DIAGNOSIS — E785 Hyperlipidemia, unspecified: Secondary | ICD-10-CM | POA: Insufficient documentation

## 2022-01-10 DIAGNOSIS — J449 Chronic obstructive pulmonary disease, unspecified: Secondary | ICD-10-CM | POA: Insufficient documentation

## 2022-01-10 DIAGNOSIS — N184 Chronic kidney disease, stage 4 (severe): Secondary | ICD-10-CM

## 2022-01-10 DIAGNOSIS — I255 Ischemic cardiomyopathy: Secondary | ICD-10-CM | POA: Insufficient documentation

## 2022-01-10 DIAGNOSIS — T85898A Other specified complication of other internal prosthetic devices, implants and grafts, initial encounter: Secondary | ICD-10-CM | POA: Insufficient documentation

## 2022-01-10 DIAGNOSIS — Z992 Dependence on renal dialysis: Secondary | ICD-10-CM | POA: Diagnosis not present

## 2022-01-10 DIAGNOSIS — I5022 Chronic systolic (congestive) heart failure: Secondary | ICD-10-CM | POA: Insufficient documentation

## 2022-01-10 DIAGNOSIS — K219 Gastro-esophageal reflux disease without esophagitis: Secondary | ICD-10-CM | POA: Insufficient documentation

## 2022-01-10 DIAGNOSIS — I132 Hypertensive heart and chronic kidney disease with heart failure and with stage 5 chronic kidney disease, or end stage renal disease: Secondary | ICD-10-CM | POA: Insufficient documentation

## 2022-01-10 DIAGNOSIS — I2510 Atherosclerotic heart disease of native coronary artery without angina pectoris: Secondary | ICD-10-CM | POA: Insufficient documentation

## 2022-01-10 DIAGNOSIS — E1122 Type 2 diabetes mellitus with diabetic chronic kidney disease: Secondary | ICD-10-CM | POA: Diagnosis not present

## 2022-01-10 DIAGNOSIS — I890 Lymphedema, not elsewhere classified: Secondary | ICD-10-CM | POA: Diagnosis not present

## 2022-01-10 DIAGNOSIS — N186 End stage renal disease: Secondary | ICD-10-CM | POA: Diagnosis not present

## 2022-01-10 DIAGNOSIS — T85691A Other mechanical complication of intraperitoneal dialysis catheter, initial encounter: Secondary | ICD-10-CM | POA: Diagnosis not present

## 2022-01-10 DIAGNOSIS — Z01818 Encounter for other preprocedural examination: Secondary | ICD-10-CM

## 2022-01-10 DIAGNOSIS — Z8711 Personal history of peptic ulcer disease: Secondary | ICD-10-CM | POA: Insufficient documentation

## 2022-01-10 DIAGNOSIS — Y838 Other surgical procedures as the cause of abnormal reaction of the patient, or of later complication, without mention of misadventure at the time of the procedure: Secondary | ICD-10-CM | POA: Insufficient documentation

## 2022-01-10 DIAGNOSIS — D649 Anemia, unspecified: Secondary | ICD-10-CM | POA: Insufficient documentation

## 2022-01-10 HISTORY — PX: CAPD REVISION: SHX5260

## 2022-01-10 LAB — POCT I-STAT, CHEM 8
BUN: 37 mg/dL — ABNORMAL HIGH (ref 8–23)
Calcium, Ion: 1.16 mmol/L (ref 1.15–1.40)
Chloride: 100 mmol/L (ref 98–111)
Creatinine, Ser: 4.5 mg/dL — ABNORMAL HIGH (ref 0.61–1.24)
Glucose, Bld: 97 mg/dL (ref 70–99)
HCT: 39 % (ref 39.0–52.0)
Hemoglobin: 13.3 g/dL (ref 13.0–17.0)
Potassium: 3.6 mmol/L (ref 3.5–5.1)
Sodium: 140 mmol/L (ref 135–145)
TCO2: 26 mmol/L (ref 22–32)

## 2022-01-10 LAB — GLUCOSE, CAPILLARY: Glucose-Capillary: 123 mg/dL — ABNORMAL HIGH (ref 70–99)

## 2022-01-10 SURGERY — LAPAROSCOPIC REVISION CONTINUOUS AMBULATORY PERITONEAL DIALYSIS  (CAPD) CATHETER
Anesthesia: General

## 2022-01-10 MED ORDER — HEPARIN SODIUM (PORCINE) 10000 UNIT/ML IJ SOLN
INTRAMUSCULAR | Status: AC
  Filled 2022-01-10: qty 1

## 2022-01-10 MED ORDER — DEXAMETHASONE SODIUM PHOSPHATE 10 MG/ML IJ SOLN
INTRAMUSCULAR | Status: DC | PRN
  Administered 2022-01-10: 5 mg via INTRAVENOUS

## 2022-01-10 MED ORDER — FENTANYL CITRATE (PF) 100 MCG/2ML IJ SOLN
INTRAMUSCULAR | Status: AC
  Filled 2022-01-10: qty 2

## 2022-01-10 MED ORDER — LIDOCAINE HCL (CARDIAC) PF 100 MG/5ML IV SOSY
PREFILLED_SYRINGE | INTRAVENOUS | Status: DC | PRN
  Administered 2022-01-10: 50 mg via INTRAVENOUS

## 2022-01-10 MED ORDER — BUPIVACAINE LIPOSOME 1.3 % IJ SUSP
INTRAMUSCULAR | Status: AC
  Filled 2022-01-10: qty 20

## 2022-01-10 MED ORDER — HYDROCODONE-ACETAMINOPHEN 5-325 MG PO TABS: 1.0000 | ORAL_TABLET | ORAL | 0 refills | Status: DC | PRN

## 2022-01-10 MED ORDER — PROPOFOL 10 MG/ML IV BOLUS
INTRAVENOUS | Status: AC
  Filled 2022-01-10: qty 20

## 2022-01-10 MED ORDER — ACETAMINOPHEN 500 MG PO TABS
ORAL_TABLET | ORAL | Status: AC
  Filled 2022-01-10: qty 2

## 2022-01-10 MED ORDER — MIDAZOLAM HCL 2 MG/2ML IJ SOLN
INTRAMUSCULAR | Status: AC
  Filled 2022-01-10: qty 2

## 2022-01-10 MED ORDER — FENTANYL CITRATE (PF) 100 MCG/2ML IJ SOLN
INTRAMUSCULAR | Status: DC | PRN
  Administered 2022-01-10: 25 ug via INTRAVENOUS

## 2022-01-10 MED ORDER — PROPOFOL 10 MG/ML IV BOLUS
INTRAVENOUS | Status: DC | PRN
  Administered 2022-01-10: 150 mg via INTRAVENOUS

## 2022-01-10 MED ORDER — ROCURONIUM BROMIDE 100 MG/10ML IV SOLN
INTRAVENOUS | Status: DC | PRN
  Administered 2022-01-10: 50 mg via INTRAVENOUS

## 2022-01-10 MED ORDER — OXYCODONE HCL 5 MG PO TABS: 5.0000 mg | ORAL_TABLET | Freq: Once | ORAL | Status: DC | PRN

## 2022-01-10 MED ORDER — SUGAMMADEX SODIUM 200 MG/2ML IV SOLN
INTRAVENOUS | Status: DC | PRN
  Administered 2022-01-10: 200 mg via INTRAVENOUS

## 2022-01-10 MED ORDER — GABAPENTIN 300 MG PO CAPS
ORAL_CAPSULE | ORAL | Status: AC
  Filled 2022-01-10: qty 1

## 2022-01-10 MED ORDER — EPHEDRINE SULFATE (PRESSORS) 50 MG/ML IJ SOLN
INTRAMUSCULAR | Status: DC | PRN
  Administered 2022-01-10: 10 mg via INTRAVENOUS
  Administered 2022-01-10 (×2): 20 mg via INTRAVENOUS

## 2022-01-10 MED ORDER — ONDANSETRON HCL 4 MG/2ML IJ SOLN
INTRAMUSCULAR | Status: DC | PRN
  Administered 2022-01-10: 4 mg via INTRAVENOUS

## 2022-01-10 MED ORDER — BUPIVACAINE-EPINEPHRINE (PF) 0.5% -1:200000 IJ SOLN
INTRAMUSCULAR | Status: DC | PRN
  Administered 2022-01-10: 50 mL

## 2022-01-10 MED ORDER — BUPIVACAINE-EPINEPHRINE (PF) 0.5% -1:200000 IJ SOLN
INTRAMUSCULAR | Status: AC
  Filled 2022-01-10: qty 30

## 2022-01-10 MED ORDER — OXYCODONE HCL 5 MG/5ML PO SOLN: 5.0000 mg | Freq: Once | ORAL | Status: DC | PRN

## 2022-01-10 MED ORDER — CEFAZOLIN SODIUM-DEXTROSE 2-4 GM/100ML-% IV SOLN
INTRAVENOUS | Status: AC
  Filled 2022-01-10: qty 100

## 2022-01-10 MED ORDER — FENTANYL CITRATE (PF) 100 MCG/2ML IJ SOLN: 25.0000 ug | INTRAMUSCULAR | Status: DC | PRN

## 2022-01-10 MED ORDER — CHLORHEXIDINE GLUCONATE 0.12 % MT SOLN
OROMUCOSAL | Status: AC
  Filled 2022-01-10: qty 15

## 2022-01-10 MED ORDER — SODIUM CHLORIDE 0.9 % IR SOLN
Status: DC | PRN
  Administered 2022-01-10: 1000 mL

## 2022-01-10 MED ORDER — PHENYLEPHRINE 80 MCG/ML (10ML) SYRINGE FOR IV PUSH (FOR BLOOD PRESSURE SUPPORT)
PREFILLED_SYRINGE | INTRAVENOUS | Status: DC | PRN
  Administered 2022-01-10: 80 ug via INTRAVENOUS

## 2022-01-10 MED ORDER — 0.9 % SODIUM CHLORIDE (POUR BTL) OPTIME
TOPICAL | Status: DC | PRN
  Administered 2022-01-10: 500 mL

## 2022-01-10 SURGICAL SUPPLY — 52 items
ADAPTER TITANIUM MEDIONICS (MISCELLANEOUS) ×1 IMPLANT
ADH SKN CLS APL DERMABOND .7 (GAUZE/BANDAGES/DRESSINGS) ×1
ADPR DLYS CATH STRL LF DISP (MISCELLANEOUS)
APPLIER CLIP 5 13 M/L LIGAMAX5 (MISCELLANEOUS) ×1
APR CLP MED LRG 5 ANG JAW (MISCELLANEOUS) ×1
BLADE CLIPPER SURG (BLADE) IMPLANT
CLIP APPLIE 5 13 M/L LIGAMAX5 (MISCELLANEOUS) IMPLANT
DERMABOND ADVANCED (GAUZE/BANDAGES/DRESSINGS) ×1
DERMABOND ADVANCED .7 DNX12 (GAUZE/BANDAGES/DRESSINGS) ×1 IMPLANT
ELECT CAUTERY BLADE TIP 2.5 (TIP) ×1
ELECT REM PT RETURN 9FT ADLT (ELECTROSURGICAL) ×1
ELECTRODE CAUTERY BLDE TIP 2.5 (TIP) ×1 IMPLANT
ELECTRODE REM PT RTRN 9FT ADLT (ELECTROSURGICAL) ×1 IMPLANT
GLOVE BIO SURGEON STRL SZ7 (GLOVE) ×1 IMPLANT
GOWN STRL REUS W/ TWL LRG LVL3 (GOWN DISPOSABLE) ×2 IMPLANT
GOWN STRL REUS W/TWL LRG LVL3 (GOWN DISPOSABLE) ×2
IV NS 1000ML (IV SOLUTION) ×1
IV NS 1000ML BAXH (IV SOLUTION) ×1 IMPLANT
KIT TURNOVER KIT A (KITS) ×1 IMPLANT
MANIFOLD NEPTUNE II (INSTRUMENTS) ×1 IMPLANT
MINICAP W/POVIDONE IODINE SOL (MISCELLANEOUS) ×1 IMPLANT
NDL INSUFFLATION 14GA 120MM (NEEDLE) ×1 IMPLANT
NDL SAFETY ECLIPSE 18X1.5 (NEEDLE) ×1 IMPLANT
NEEDLE HYPO 18GX1.5 SHARP (NEEDLE) ×1
NEEDLE HYPO 22GX1.5 SAFETY (NEEDLE) ×1 IMPLANT
NEEDLE INSUFFLATION 14GA 120MM (NEEDLE) ×1 IMPLANT
NS IRRIG 500ML POUR BTL (IV SOLUTION) ×1 IMPLANT
PACK LAP CHOLECYSTECTOMY (MISCELLANEOUS) ×1 IMPLANT
PENCIL SMOKE EVACUATOR (MISCELLANEOUS) ×1 IMPLANT
SCISSORS METZENBAUM CVD 33 (INSTRUMENTS) IMPLANT
SET CYSTO W/LG BORE CLAMP LF (SET/KITS/TRAYS/PACK) ×1 IMPLANT
SET TRANSFER 6 W/TWIST CLAMP 5 (SET/KITS/TRAYS/PACK) ×1 IMPLANT
SET TUBE SMOKE EVAC HIGH FLOW (TUBING) ×1 IMPLANT
SLEEVE ENDOPATH XCEL 5M (ENDOMECHANICALS) ×1 IMPLANT
SPONGE DRAIN TRACH 4X4 STRL 2S (GAUZE/BANDAGES/DRESSINGS) ×1 IMPLANT
SPONGE T-LAP 18X18 ~~LOC~~+RFID (SPONGE) ×1 IMPLANT
STYLET FALLER (MISCELLANEOUS) IMPLANT
SUT ETHILON 3-0 FS-10 30 BLK (SUTURE) ×1
SUT MNCRL 4-0 (SUTURE) ×1
SUT MNCRL 4-0 27XMFL (SUTURE) ×1
SUT VIC AB 3-0 SH 27 (SUTURE) ×1
SUT VIC AB 3-0 SH 27X BRD (SUTURE) ×1 IMPLANT
SUT VICRYL 0 AB UR-6 (SUTURE) ×2 IMPLANT
SUTURE EHLN 3-0 FS-10 30 BLK (SUTURE) ×1 IMPLANT
SUTURE MNCRL 4-0 27XMF (SUTURE) ×1 IMPLANT
SYR 20ML LL LF (SYRINGE) ×1 IMPLANT
SYR 3ML LL SCALE MARK (SYRINGE) ×1 IMPLANT
SYS KII FIOS ACCESS ABD 5X100 (TROCAR) ×1
SYSTEM KII FIOS ACES ABD 5X100 (TROCAR) ×1 IMPLANT
TRAP FLUID SMOKE EVACUATOR (MISCELLANEOUS) ×1 IMPLANT
TROCAR XCEL NON-BLD 5MMX100MML (ENDOMECHANICALS) ×1 IMPLANT
WATER STERILE IRR 500ML POUR (IV SOLUTION) ×1 IMPLANT

## 2022-01-10 NOTE — Discharge Instructions (Signed)

## 2022-01-10 NOTE — Anesthesia Procedure Notes (Signed)
Procedure Name: Intubation Date/Time: 01/10/2022 1:08 PM  Performed by: Biagio Borg, CRNAPre-anesthesia Checklist: Patient identified, Emergency Drugs available, Suction available and Patient being monitored Patient Re-evaluated:Patient Re-evaluated prior to induction Oxygen Delivery Method: Circle system utilized Preoxygenation: Pre-oxygenation with 100% oxygen Induction Type: IV induction Ventilation: Mask ventilation without difficulty Laryngoscope Size: McGraph and 4 Grade View: Grade I Tube type: Oral Tube size: 7.0 mm Number of attempts: 1 Airway Equipment and Method: Stylet Placement Confirmation: ETT inserted through vocal cords under direct vision, positive ETCO2 and breath sounds checked- equal and bilateral Secured at: 21 cm Tube secured with: Tape Dental Injury: Teeth and Oropharynx as per pre-operative assessment

## 2022-01-10 NOTE — Anesthesia Preprocedure Evaluation (Signed)
Anesthesia Evaluation  Patient identified by MRN, date of birth, ID band Patient awake    Reviewed: Allergy & Precautions, NPO status , Patient's Chart, lab work & pertinent test results  History of Anesthesia Complications Negative for: history of anesthetic complications  Airway Mallampati: III  TM Distance: <3 FB Neck ROM: full    Dental  (+) Chipped, Missing, Poor Dentition   Pulmonary shortness of breath and with exertion, sleep apnea , COPD, Current Smoker and Patient abstained from smoking.,    Pulmonary exam normal        Cardiovascular Exercise Tolerance: Good hypertension, (-) angina+ CAD, + Past MI, + CABG, + Peripheral Vascular Disease and +CHF  Normal cardiovascular exam     Neuro/Psych  Headaches, PSYCHIATRIC DISORDERS  Neuromuscular disease    GI/Hepatic Neg liver ROS, PUD, GERD  Controlled,  Endo/Other  negative endocrine ROSdiabetes, Type 2  Renal/GU DialysisRenal disease     Musculoskeletal   Abdominal   Peds  Hematology negative hematology ROS (+)   Anesthesia Other Findings Past Medical History: No date: Anemia No date: Arthritis No date: CAD (coronary artery disease)     Comment:  a. 12/2016 s/p CABG x 4 (LIMA->LAD, VG->RCA, VG->OM1,               VG->D1); b. 02/2018 MV: EF 35%, small-med inferolaterlal               infarct. No ischemia. 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 02/2019 EF               40-45%; d.) 08/2019 Echo: EF 50-55%, no rwma, GrII DD; e.)              04/2020 Echo: EF 30-35%, mod-sev glob HK. Mild LVH. G2DD.              Low-nl RV fxn. Mildly dil LA. Mild MR. Mild-mod Ao                sclerosis w/o stenosis; f.) TTE 02/09/2021: EF 55%;               septal HK, LAE; G2DD. 02/08/2021: History of 2019 novel coronavirus disease (COVID-19) No date: Hyperlipidemia No date: Hypertension No date: Ischemic cardiomyopathy     Comment:  a.) 02/2018 MV: EF 35%; b.) 08/2019 Echo: EF 50-55%; c.)               04/2020 Echo: EF 30-35%; d.) TTE 02/09/2021: EF 50-55%. No date: Lymphedema No date: Migraine No date: Myocardial infarction [facility]) No date: Neuropathy No date: PAD (peripheral artery disease) ([place])     Comment:  a. 04/2017 Aortoiliac duplex: R iliac dzs; b. 03/2020 LE              Duplex: mild bilat atherosclerosis throughout. Patent               vessels. No date: Pneumonia No date: Retinopathy No date: S/P CABG x 4     Comment:  a.)  4v CABG: LIMA->LAD, SVG->RCA, SVG->OM1, SVG->D1 No date: Sleep apnea     Comment:  a.) does not require nocturnal PAP therapy No date: Statin intolerance No date: Stomach ulcer No date: T2DM (type  2 diabetes mellitus) ([place])  Past Surgical History: No date: bone graft surgery; Bilateral     Comment:  on both feet x 2 11/13/2021: CAPD INSERTION; N/A     Comment:  Procedure: LAPAROSCOPIC INSERTION CONTINUOUS AMBULATORY               PERITONEAL DIALYSIS  (CAPD) CATHETER;  Surgeon: Jules Husbands, MD;  Location: [facility] ORS;  Service: General;                Laterality: N/A; 2013: CARDIAC CATHETERIZATION     Comment:  S/p PCI  2018: CARDIAC CATHETERIZATION     Comment:  S/p CABG No date: CATARACT EXTRACTION; Bilateral 2018: CORONARY ARTERY BYPASS GRAFT     Comment:  (LIMA-LAD,VG-RCA,VG-OM1,VG-D1) 04/04/2020: ESOPHAGOGASTRODUODENOSCOPY (EGD) WITH PROPOFOL; N/A     Comment:  Procedure: ESOPHAGOGASTRODUODENOSCOPY (EGD) WITH               PROPOFOL;  Surgeon: Lucilla Lame, MD;  Location: [facility]               ENDOSCOPY;  Service: Endoscopy;  Laterality: N/A; 06/06/2020: ESOPHAGOGASTRODUODENOSCOPY (EGD) WITH PROPOFOL; N/A      Comment:  Procedure: ESOPHAGOGASTRODUODENOSCOPY (EGD) WITH               PROPOFOL;  Surgeon: Lucilla Lame, MD;  Location: [facility]               ENDOSCOPY;  Service: Endoscopy;  Laterality: N/A; 10/28/2016: PERIPHERAL VASCULAR CATHETERIZATION; Right     Comment:  PTA/DEB Right SFA No date: WRIST SURGERY; Left     Comment:  cyst  BMI    Body Mass Index: 26.32 kg/m      Reproductive/Obstetrics negative OB ROS                             Anesthesia Physical Anesthesia Plan  ASA: 4  Anesthesia Plan: General ETT   Post-op Pain Management:    Induction: Intravenous  PONV Risk Score and Plan: Ondansetron, Dexamethasone, Midazolam and Treatment may vary due to age or medical condition  Airway Management Planned: Oral ETT  Additional Equipment:   Intra-op Plan:   Post-operative Plan: Extubation in OR  Informed Consent: I have reviewed the patients History and Physical, chart, labs and discussed the procedure including the risks, benefits and alternatives for the proposed anesthesia with the patient or authorized representative who has indicated his/her understanding and acceptance.     Dental Advisory Given  Plan Discussed with: Anesthesiologist, CRNA and Surgeon  Anesthesia Plan Comments: (Patient consented for risks of anesthesia including but not limited to:  - adverse reactions to medications - damage to eyes, teeth, lips or other oral mucosa - nerve damage due to positioning  - sore throat or hoarseness - Damage to heart, brain, nerves, lungs, other parts of body or loss of life  Patient voiced understanding.)        Anesthesia Quick Evaluation

## 2022-01-10 NOTE — Op Note (Signed)
Laparoscopic revision of peritoneal Dialysis catheter    Pre-operative Diagnosis: ESRD, catheter dysfunction ( when pt supine that corrects when he stands up)   Post-operative Diagnosis: same     Surgeon: Caroleen Hamman, MD FACS   Anesthesia: Gen. with endotracheal tube      Findings: Catheter within pelvis No evidence of kinks of significant adhesions Stitch used to tacked catheter to abdominal wall tenting it up about 2 cms Good return after infusing Peritoneal cavity   Estimated Blood Loss: 5cc              Complications: none     Procedure Details  The patient was seen again in the Holding Room. The benefits, complications, treatment options, and expected outcomes were discussed with the patient. The risks of bleeding, infection, recurrence of symptoms, failure to resolve symptoms, catheter malfunction bowel injury, any of which could require further surgery were reviewed with the patient. The likelihood of improving the patient's symptoms with return to their baseline status is good.  The patient and/or family concurred with the proposed plan, giving informed consent.  The patient was taken to Operating Room, identified and the procedure verified. A Time Out was held and the above information confirmed.   Prior to the induction of general anesthesia, antibiotic prophylaxis was administered. VTE prophylaxis was in place. General endotracheal anesthesia was then administered and tolerated well. After the induction, the abdomen was prepped with Chloraprep and draped in the sterile fashion. The patient was positioned in the supine position.   Veres needle inserted in the standard fashion at Palmer's point, saline drop test was appropriate. Pneumoperitoneum was obtained w/o hemodynamic changes. We placed two additional laparoscopic ports under direct visualization. No injuries visualized. Catheter found within the pelvis and no evidence of adhesion or clogs intraluminally. I was able to  tent the catheter  and placed a 2-0 vicryl stitch along with clips to hold th catheter more anteriorly in an attempt to improve flow.  We tested and  the catheter was widely patent with great return. Laparoscopic instruments and ports removed.  All skin incisions  were infiltrated with a liposomal Marcaine. 4-0 subcuticular Monocryl was used to close the skin. Dermabond was  applied. Sterile dressing applied to the catheter. The patient was then extubated and brought to the recovery room in stable condition. Sponge, lap, and needle counts were correct at closure and at the conclusion of the case.               Caroleen Hamman, MD, FACS

## 2022-01-10 NOTE — Interval H&P Note (Signed)
History and Physical Interval Note:  01/10/2022 12:41 PM  [name] [name]  has presented today for surgery, with the diagnosis of CKD.  The various methods of treatment have been discussed with the patient and family. After consideration of risks, benefits and other options for treatment, the patient has consented to  Procedure(s): [name]  (CAPD) CATHETER (N/A) as a surgical intervention.  The patient's history has been reviewed, patient examined, no change in status, stable for surgery.  I have reviewed the patient's chart and labs.  Questions were answered to the patient's satisfaction.     [place]

## 2022-01-10 NOTE — Transfer of Care (Signed)
Immediate Anesthesia Transfer of Care Note  Patient: [name] [name]  Procedure(s) Performed: LAPAROSCOPIC REVISION CONTINUOUS AMBULATORY PERITONEAL DIALYSIS  (CAPD) CATHETER  Patient Location: PACU  Anesthesia Type:General  Level of Consciousness: awake  Airway & Oxygen Therapy: Patient Spontanous Breathing  Post-op Assessment: Report given to RN and Post -op Vital signs reviewed and stable  Post vital signs: Reviewed and stable  Last Vitals:  Vitals Value Taken Time  BP 174/80 01/10/22 1414  Temp 36.2 C 01/10/22 1414  Pulse 63 01/10/22 1414  Resp 15 01/10/22 1414  SpO2 99 % 01/10/22 1414    Last Pain:  Vitals:   01/10/22 1414  TempSrc:   PainSc: 0-No pain         Complications: No notable events documented.

## 2022-01-10 NOTE — Anesthesia Postprocedure Evaluation (Signed)
Anesthesia Post Note  Patient: Chawn Spraggins  Procedure(s) Performed: LAPAROSCOPIC REVISION CONTINUOUS AMBULATORY PERITONEAL DIALYSIS  (CAPD) CATHETER  Patient location during evaluation: PACU Anesthesia Type: General Level of consciousness: awake and alert Pain management: pain level controlled Vital Signs Assessment: post-procedure vital signs reviewed and stable Respiratory status: spontaneous breathing, nonlabored ventilation, respiratory function stable and patient connected to nasal cannula oxygen Cardiovascular status: blood pressure returned to baseline and stable Postop Assessment: no apparent nausea or vomiting Anesthetic complications: no   No notable events documented.   Last Vitals:  Vitals:   01/10/22 1429 01/10/22 1445  BP: (!) 174/79 (!) 178/84  Pulse: 69 65  Resp: 16 16  Temp:  (!) 36.1 C  SpO2: 97% 95%    Last Pain:  Vitals:   01/10/22 1445  TempSrc:   PainSc: 0-No pain                 Precious Haws Floretta Petro

## 2022-01-11 ENCOUNTER — Encounter: Payer: Self-pay | Admitting: Surgery

## 2022-01-16 ENCOUNTER — Encounter: Payer: Self-pay | Admitting: Nurse Practitioner

## 2022-01-16 ENCOUNTER — Ambulatory Visit (INDEPENDENT_AMBULATORY_CARE_PROVIDER_SITE_OTHER): Payer: Managed Care, Other (non HMO) | Admitting: Nurse Practitioner

## 2022-01-16 VITALS — BP 120/70 | HR 72 | Temp 98.0°F | Resp 16 | Ht 74.0 in | Wt 205.0 lb

## 2022-01-16 DIAGNOSIS — E1122 Type 2 diabetes mellitus with diabetic chronic kidney disease: Secondary | ICD-10-CM | POA: Diagnosis not present

## 2022-01-16 DIAGNOSIS — Z992 Dependence on renal dialysis: Secondary | ICD-10-CM

## 2022-01-16 DIAGNOSIS — N184 Chronic kidney disease, stage 4 (severe): Secondary | ICD-10-CM

## 2022-01-16 DIAGNOSIS — I700 Atherosclerosis of aorta: Secondary | ICD-10-CM

## 2022-01-16 DIAGNOSIS — N1832 Chronic kidney disease, stage 3b: Secondary | ICD-10-CM

## 2022-01-16 DIAGNOSIS — R300 Dysuria: Secondary | ICD-10-CM | POA: Diagnosis not present

## 2022-01-16 DIAGNOSIS — E1140 Type 2 diabetes mellitus with diabetic neuropathy, unspecified: Secondary | ICD-10-CM

## 2022-01-16 DIAGNOSIS — Z0001 Encounter for general adult medical examination with abnormal findings: Secondary | ICD-10-CM

## 2022-01-16 DIAGNOSIS — I5022 Chronic systolic (congestive) heart failure: Secondary | ICD-10-CM

## 2022-01-16 MED ORDER — PANTOPRAZOLE SODIUM 40 MG PO TBEC: 40.0000 mg | DELAYED_RELEASE_TABLET | Freq: Two times a day (BID) | ORAL | 3 refills | Status: DC

## 2022-01-16 NOTE — Progress Notes (Signed)
[facility] [place], [place] [address]  Internal MEDICINE  Office Visit Note  Patient Name: [name] [name]  [id number]  [id number]  Date of Service: 01/16/2022  Chief Complaint  Patient presents with   Annual Exam   Diabetes   Gastroesophageal Reflux   Hypertension   Hyperlipidemia   Quality Metric Gaps    Foot Exam and Shingles Vaccine    HPI [name] [name] presents for an annual well visit and physical exam.  Well-appearing 61 year old male with diabetes, stage 4 CKD, GERD, COPD, hypertension, chronic systolic CHF, CAD, and degenerative skeletal changes esp in spine.  --Started CAPD, glucose levels have been better, tresiba dose down to 100 from 140, bolus doses are decreased as well.  --A1c 9.2 now, best it has been in a ong time.  --Not getting good sleep x4 weeks due to pump/drain issues with capd, will be trying heparin tonight to see if this fixes the problem.  --Foot exam done by endo and podiatry  --Overall feeling well just sleep deprived --PSA normal in 2021 and colonoscopy not due until 2025. --mobility is improved some, able to stand up and walk from scooter to chair.    Current Medication: Outpatient Encounter Medications as of 01/16/2022  Medication Sig Note   aspirin EC 81 MG tablet Take 81 mg by mouth daily. Swallow whole.    carvedilol (COREG) 12.5 MG tablet TAKE 18.5 MG  (1 1/2 PILL) TWICE A DAY (Patient taking differently: Take 18.5 mg by mouth 2 (two) times daily with a meal. TAKE 18.5 MG  (1 1/2 PILL) TWICE A DAY)    cholecalciferol (VITAMIN D3) 25 MCG (1000 UNIT) tablet Take 2,000 Units by mouth 2 (two) times daily.    docusate calcium (SURFAK) 240 MG capsule Take 240 mg by mouth daily.    Evolocumab (REPATHA SURECLICK) 829 MG/ML SOAJ Inject 1 Dose into the skin every 14 (fourteen) days. (Patient taking differently: Inject 1 Dose into the skin every 14 (fourteen) days.)    Insulin Pen Needle (B-D ULTRAFINE III SHORT PEN) 31G X 8 MM  MISC To use with pen basal and mealtime coverage insulin pens 5 times daily. DX: E11.65    losartan (COZAAR) 25 MG tablet Take 25 mg by mouth every evening.    nortriptyline (PAMELOR) 10 MG capsule Take 50 mg by mouth at bedtime.    NOVOLOG FLEXPEN 100 UNIT/ML FlexPen Inject into the skin 3 (three) times daily with meals. 3 x 's daily with meals - SSI    Ruxolitinib Phosphate (OPZELURA) 1.5 % CREA Apply to skin daily, legs (Patient taking differently: Apply 1 Application topically daily. Apply to skin daily, legs)    torsemide (DEMADEX) 20 MG tablet Take 40 mg daily (two tabs), may take additional 40 mg (two tabs) in the evening for swelling to lower legs (Patient taking differently: Take 20 mg by mouth 2 (two) times daily. Take 40 mg daily (two tabs), may take additional 40 mg (two tabs) in the evening for swelling to lower legs)    TRESIBA FLEXTOUCH 200 UNIT/ML FlexTouch Pen 100 Units at bedtime. 01/10/2022: 50 units last night   [DISCONTINUED] ezetimibe (ZETIA) 10 MG tablet Take 1 tablet (10 mg total) by mouth daily. (Patient taking differently: Take 10 mg by mouth at bedtime.)    [DISCONTINUED] HYDROcodone-acetaminophen (NORCO/VICODIN) 5-325 MG tablet Take 1 tablet by mouth every 4 (four) hours as needed for moderate pain.    [DISCONTINUED] isosorbide mononitrate (IMDUR) 30 MG 24 hr  tablet Take 1 tablet (30 mg total) by mouth 2 (two) times daily.    [DISCONTINUED] pantoprazole (PROTONIX) 40 MG tablet Take 1 tablet (40 mg total) by mouth 2 (two) times daily before a meal.    pantoprazole (PROTONIX) 40 MG tablet Take 1 tablet (40 mg total) by mouth 2 (two) times daily before a meal.    No facility-administered encounter medications on file as of 01/16/2022.    Surgical History: Past Surgical History:  Procedure Laterality Date   bone graft surgery Bilateral    on both feet x 2   CAPD INSERTION N/A 11/13/2021   Procedure: LAPAROSCOPIC INSERTION CONTINUOUS AMBULATORY PERITONEAL DIALYSIS  (CAPD)  CATHETER;  Surgeon: Jules Husbands, MD;  Location: [facility] ORS;  Service: General;  Laterality: N/A;   CAPD REVISION N/A 01/10/2022   Procedure: LAPAROSCOPIC REVISION CONTINUOUS AMBULATORY PERITONEAL DIALYSIS  (CAPD) CATHETER;  Surgeon: Jules Husbands, MD;  Location: [facility] OR[facility];  Service: General;  Laterality: N/A;   CARDIAC CATHETERIZATION  2013   S/p PCI    CARDIAC CATHETERIZATION  2018   S/p CABG   CATARACT EXTRACTION Bilateral    CORONARY ARTERY BYPASS GRAFT  2018   (LIMA-LAD,VG-RCA,VG-OM1,VG-D1)   ESOPHAGOGASTRODUODENOSCOPY (EGD) WITH PROPOFOL N/A 04/04/2020   Procedure: ESOPHAGOGASTRODUODENOSCOPY (EGD) WITH PROPOFOL;  Surgeon: Lucilla Lame, MD;  Location: [facility];  Service: Endoscopy;  Laterality: N/A;   ESOPHAGOGASTRODUODENOSCOPY (EGD) WITH PROPOFOL N/A 06/06/2020   Procedure: ESOPHAGOGASTRODUODENOSCOPY (EGD) WITH PROPOFOL;  Surgeon: Lucilla Lame, MD;  Location: [facility] END[facility];  Service: Endoscopy;  Laterality: N/A;   PERIPHERAL VASCULAR CATHETERIZATION Right 10/28/2016   PTA/DEB Right SFA   WRIST SURGERY Left    cyst    Medical History: Past Medical History:  Diagnosis Date   Anemia    Arthritis    CAD (coronary artery disease)    a. 12/2016 s/p CABG x 4 (LIMA->LAD, VG->RCA, VG->OM1, VG->D1); b. 02/2018 MV: EF 35%, small-med inferolaterlal infarct. No ischemia.   CKD (chronic kidney disease), stage IV (HCC)    Colon polyps    COPD (chronic obstructive pulmonary disease) (HCC)    Dupre's syndrome    GERD (gastroesophageal reflux disease)    HFrEF (heart failure reduced ejection fraction) ([place])    a.) 2018 EF 35%; b.) 02/2018 EF 50%; c). 02/2019 EF 40-45%; d.) 08/2019 Echo: EF 50-55%, no rwma, GrII DD; e.) 04/2020 Echo: EF 30-35%, mod-sev glob HK. Mild LVH. G2DD. Low-nl RV fxn. Mildly dil LA. Mild MR. Mild-mod Ao sclerosis w/o stenosis; f.) TTE 02/09/2021: EF 55%; septal HK, LAE; G2DD.   History of 2019 novel coronavirus disease (COVID-19) 02/08/2021   Hyperlipidemia     Hypertension    Ischemic cardiomyopathy    a.) 02/2018 MV: EF 35%; b.) 08/2019 Echo: EF 50-55%; c.) 04/2020 Echo: EF 30-35%; d.) TTE 02/09/2021: EF 50-55%.   Lymphedema    Migraine    Myocardial infarction [facility])    Neuropathy    PAD (peripheral artery disease) ([place])    a. 04/2017 Aortoiliac duplex: R iliac dzs; b. 03/2020 LE Duplex: mild bilat atherosclerosis throughout. Patent vessels.   Pneumonia    Retinopathy    S/P CABG x 4    a.)  4v CABG: LIMA->LAD, SVG->RCA, SVG->OM1, SVG->D1   Sleep apnea    a.) does not require nocturnal PAP therapy   Statin intolerance    Stomach ulcer    T2DM (type 2 diabetes mellitus) ([place])     Family History: Family History  Problem Relation Age of Onset  Diabetes Mother    Heart disease Father     Social History   Socioeconomic History   Marital status: Married    Spouse name: Roxanne   Number of children: Not on file   Years of education: Not on file   Highest education level: Not on file  Occupational History   Not on file  Tobacco Use   Smoking status: Some Days    Packs/day: 0.25    Years: 39.00    Total pack years: 9.75    Types: Cigarettes    Passive exposure: Past   Smokeless tobacco: Never  Vaping Use   Vaping Use: Never used  Substance and Sexual Activity   Alcohol use: Yes    Comment: very rarely - wine   Drug use: Never   Sexual activity: Not on file  Other Topics Concern   Not on file  Social History Narrative   Not on file   Social Determinants of Health   Financial Resource Strain: Not on file  Food Insecurity: Not on file  Transportation Needs: Not on file  Physical Activity: Not on file  Stress: Not on file  Social Connections: Not on file  Intimate Partner Violence: Not on file      Review of Systems  Constitutional: Negative.   HENT: Negative.    Eyes: Negative.   Respiratory:  Negative for cough, chest tightness, shortness of breath and wheezing.   Cardiovascular: Negative.  Negative for  chest pain and palpitations.  Gastrointestinal:  Positive for constipation (takes stool softener once daily). Negative for abdominal pain, diarrhea, nausea and vomiting.  Endocrine: Negative.   Genitourinary:  Positive for decreased urine volume and difficulty urinating (decreased, on peritoneal dialysis).  Musculoskeletal:  Positive for arthralgias and gait problem.       Walking some with rollator  Skin: Negative.   Psychiatric/Behavioral:  Positive for sleep disturbance. Negative for behavioral problems, self-injury and suicidal ideas. The patient is not nervous/anxious.     Vital Signs: BP 120/70 Comment: 160/43  Pulse 72   Temp 98 F (36.7 C)   Resp 16   Ht 6\' 2"  (1.88 m)   Wt 205 lb (93 kg)   SpO2 98%   BMI 26.32 kg/m    Physical Exam Vitals reviewed.  Constitutional:      General: He is not in acute distress.    Appearance: Normal appearance. He is not ill-appearing.  HENT:     Head: Normocephalic and atraumatic.     Right Ear: Tympanic membrane, ear canal and external ear normal.     Left Ear: Tympanic membrane, ear canal and external ear normal.     Nose: Nose normal.     Mouth/Throat:     Mouth: Mucous membranes are moist.     Pharynx: Oropharynx is clear.  Eyes:     Extraocular Movements: Extraocular movements intact.     Conjunctiva/sclera: Conjunctivae normal.     Pupils: Pupils are equal, round, and reactive to light.  Cardiovascular:     Rate and Rhythm: Normal rate and regular rhythm.     Pulses: Normal pulses.     Heart sounds: Normal heart sounds. No murmur heard. Pulmonary:     Effort: Pulmonary effort is normal. No respiratory distress.     Breath sounds: Normal breath sounds. No wheezing.  Abdominal:     General: Bowel sounds are normal. There is no distension.     Palpations: Abdomen is soft.     Tenderness: There is  no abdominal tenderness.     Comments: CAPD port site, clean and dry  Musculoskeletal:     Cervical back: Normal range of  motion and neck supple.     Right lower leg: No edema.     Left lower leg: No edema.  Lymphadenopathy:     Cervical: No cervical adenopathy.  Skin:    General: Skin is warm and dry.     Capillary Refill: Capillary refill takes less than 2 seconds.  Neurological:     Mental Status: He is alert and oriented to person, place, and time.     Sensory: Sensory deficit present.     Motor: Weakness present.     Gait: Gait abnormal (walking some with rollator and was observed walking from chair to scooter without assistance).  Psychiatric:        Mood and Affect: Mood normal.        Behavior: Behavior normal.        Thought Content: Thought content normal.        Judgment: Judgment normal.        Assessment/Plan: 1. Encounter for general adult medical examination with abnormal findings Age-appropriate preventive screenings and vaccinations discussed, annual physical exam completed. Routine labs for health maintenance ordered, only lipid panel, other labs are fairly recent and he has labs drawn with endocrinology. PHM updated.  - pantoprazole (PROTONIX) 40 MG tablet; Take 1 tablet (40 mg total) by mouth 2 (two) times daily before a meal.  Dispense: 180 tablet; Refill: 3  2. Diabetes mellitus with stage 4 chronic kidney disease GFR 15-29 (HCC) A1c improved significantly since initiating CAPD at home. He is still adjusting but he is managing CAPD well. With improved a1c, he has been able to decrease basal insulin and preprandial insulin doses. And still being managed by endocrinology and sees his specialist regularly.   3. Aortic atherosclerosis (HCC) Repeat lipid panel fasting. Lab req provided to patient - Lipid Profile  4.  Peritoneal dialysis catheter in place [facility]) In place, secure to abdomen, site clean, dry and intact. Patient is knowledgeable about catheter and how to initiate CAPD and how to disconnect when it is completed. He has received detailed instruction and education through  his nephrologist. He has no questions related to his CAPD catheter. Continue CAPD as instructed by nephrologist.       General Counseling: Tillman Abide understanding of the findings of todays visit and agrees with plan of treatment. I have discussed any further diagnostic evaluation that may be needed or ordered today. We also reviewed his medications today. he has been encouraged to call the office with any questions or concerns that should arise related to todays visit.    Orders Placed This Encounter  Procedures   Lipid Profile    Meds ordered this encounter  Medications   pantoprazole (PROTONIX) 40 MG tablet    Sig: Take 1 tablet (40 mg total) by mouth 2 (two) times daily before a meal.    Dispense:  180 tablet    Refill:  3    For future refills    Return in about 6 months (around 07/19/2022) for F/U, Alayia Meggison PCP.   Total time spent:30 Minutes Time spent includes review of chart, medications, test results, and follow up plan with the patient.   [place] Controlled Substance Database was reviewed by me.  This patient was seen by Jonetta Osgood, FNP-C in collaboration with Dr. [name] as a part of collaborative care agreement.  Marquasha Brutus R.  Valetta Fuller, MSN, FNP-C Internal medicine

## 2022-01-30 ENCOUNTER — Ambulatory Visit (INDEPENDENT_AMBULATORY_CARE_PROVIDER_SITE_OTHER): Payer: Managed Care, Other (non HMO) | Admitting: Surgery

## 2022-01-30 ENCOUNTER — Encounter: Payer: Self-pay | Admitting: Surgery

## 2022-01-30 VITALS — BP 143/72 | HR 65 | Temp 98.0°F | Ht 74.0 in | Wt 205.0 lb

## 2022-01-30 DIAGNOSIS — Z09 Encounter for follow-up examination after completed treatment for conditions other than malignant neoplasm: Secondary | ICD-10-CM | POA: Diagnosis not present

## 2022-01-30 DIAGNOSIS — Z992 Dependence on renal dialysis: Secondary | ICD-10-CM | POA: Diagnosis not present

## 2022-01-30 DIAGNOSIS — N186 End stage renal disease: Secondary | ICD-10-CM

## 2022-01-30 NOTE — Patient Instructions (Addendum)
Follow-up with our office as needed.  Please call and ask to speak with a nurse if you develop questions or concerns.   GENERAL POST-OPERATIVE PATIENT INSTRUCTIONS   WOUND CARE INSTRUCTIONS:   Try to keep the wound dry and avoid ointments on the wound unless directed to do so.  If the wound becomes bright red and painful or starts to drain infected material that is not clear, please contact your physician immediately.  If the wound is mildly pink and has a thick firm ridge underneath it, this is normal, and is referred to as a healing ridge.  This will resolve over the next 4-6 weeks.  BATHING: You may shower if you have been informed of this by your surgeon. However, Please do not submerge in a tub, hot tub, or pool until incisions are completely sealed or have been told by your surgeon that you may do so.  DIET:  You may eat any foods that you can tolerate.  It is a good idea to eat a high fiber diet and take in plenty of fluids to prevent constipation.  If you do become constipated you may want to take a mild laxative or take ducolax tablets on a daily basis until your bowel habits are regular.  Constipation can be very uncomfortable, along with straining, after recent surgery.  ACTIVITY:   You are encouraged to walk and engage in light activity for the next two weeks.  You should not lift more than 20 pounds for 6 weeks total after surgery as it could put you at increased risk for complications.  Twenty pounds is roughly equivalent to a plastic bag of groceries. At that time- Listen to your body when lifting, if you have pain when lifting, stop and then try again in a few days. Soreness after doing exercises or activities of daily living is normal as you get back in to your normal routine.  MEDICATIONS:  Try to take narcotic medications and anti-inflammatory medications, such as tylenol, ibuprofen, naprosyn, etc., with food.  This will minimize stomach upset from the medication.  Should you  develop nausea and vomiting from the pain medication, or develop a rash, please discontinue the medication and contact your physician.  You should not drive, make important decisions, or operate machinery when taking narcotic pain medication.  SUNBLOCK Use sun block to incision area over the next year if this area will be exposed to sun. This helps decrease scarring and will allow you avoid a permanent darkened area over your incision.  QUESTIONS:  Please feel free to call our office if you have any questions, and we will be glad to assist you. [phone]

## 2022-01-30 NOTE — Progress Notes (Signed)
John is 3 weeks out from PD catheter revision.  Is working better.  Still gets some alarms from the system. Abdominal pain.  No fevers no chills.  He is taking p.o. well.  PE NAD Abd: Soft incisions healing well without infection.  No peritonitis no infection  A/p Doing well w/o complications RTC prn

## 2022-02-12 ENCOUNTER — Encounter: Payer: Self-pay | Admitting: Cardiovascular Disease

## 2022-02-18 MED ORDER — HYDRALAZINE HCL 50 MG PO TABS
50.0000 mg | ORAL_TABLET | Freq: Three times a day (TID) | ORAL | 3 refills | Status: DC
Start: 2022-02-18 — End: 2022-08-29

## 2022-02-20 ENCOUNTER — Encounter: Payer: Self-pay | Admitting: Nephrology

## 2022-03-21 ENCOUNTER — Other Ambulatory Visit: Payer: Self-pay

## 2022-03-21 DIAGNOSIS — I700 Atherosclerosis of aorta: Secondary | ICD-10-CM

## 2022-03-21 MED ORDER — ISOSORBIDE MONONITRATE ER 30 MG PO TB24: 30.0000 mg | ORAL_TABLET | Freq: Two times a day (BID) | ORAL | 0 refills | Status: DC

## 2022-03-21 MED ORDER — EZETIMIBE 10 MG PO TABS: 10.0000 mg | ORAL_TABLET | Freq: Every day | ORAL | 0 refills | Status: DC

## 2022-03-23 ENCOUNTER — Encounter: Payer: Self-pay | Admitting: Nurse Practitioner

## 2022-03-25 ENCOUNTER — Encounter (INDEPENDENT_AMBULATORY_CARE_PROVIDER_SITE_OTHER): Payer: Self-pay

## 2022-03-30 IMAGING — RF DG SNIFF TEST
4 series · 12 of 12 positions shown · non-contrast
Comparison: 08/24/2019

CLINICAL DATA: Elevated right hemidiaphragm with increasing
shortness of breath and peripheral swelling

EXAM:
CHEST FLUOROSCOPY
TECHNIQUE: Real-time fluoroscopic evaluation of the chest was performed.
FLUOROSCOPY TIME:  Fluoroscopy Time:  24 seconds
Radiation Exposure Index (if provided by the fluoroscopic device):
4.5 mGy
Number of Acquired Spot Images: Multiple cine fluoroscopic runs

[Series 1: fluoro_chest_bw · 0.17mm/px · 2 of 2 frames shown (1 of 2)]
[frame 1/2]
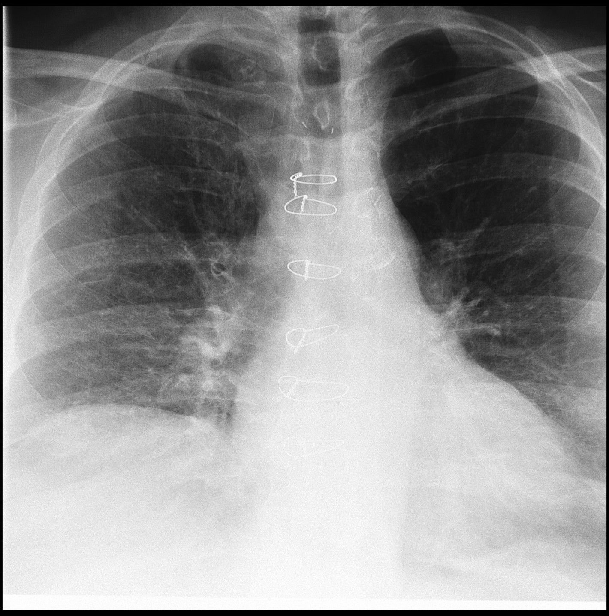
[frame 2/2]
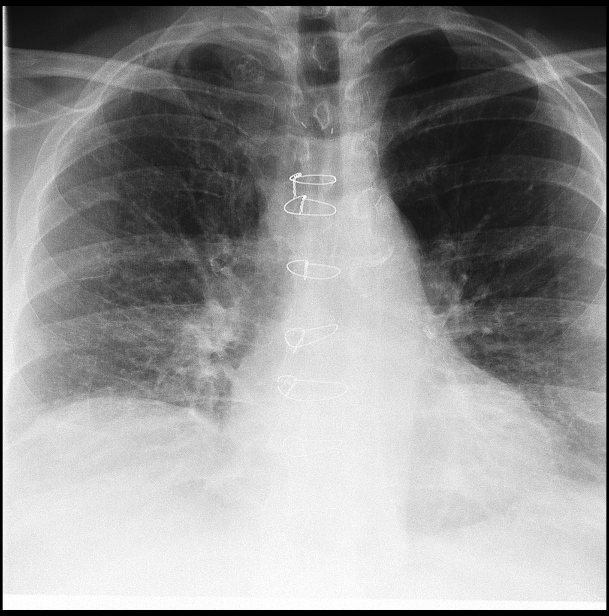

[Series 2: fluoro_chest_bw · 0.17mm/px · 2 of 2 frames shown (2 of 2)]
[frame 1/2]
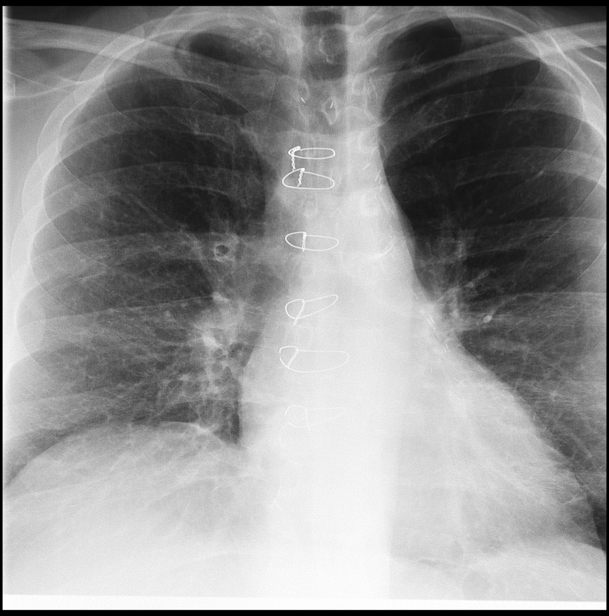
[frame 2/2]
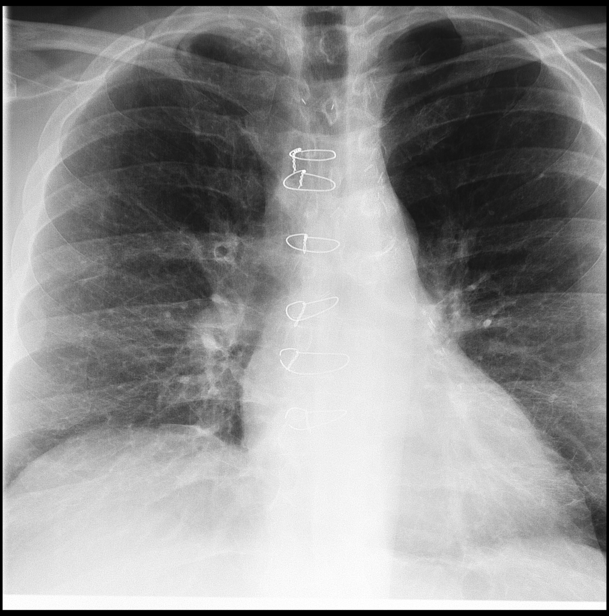

[Series 3: cp_chest · 0.26mm/px · 4 of 71 frames shown (1 of 2)]
[frame 11/71]
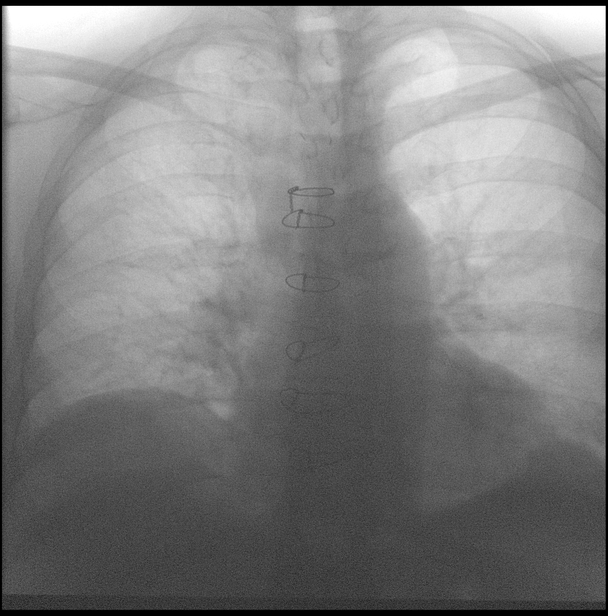
[frame 34/71]
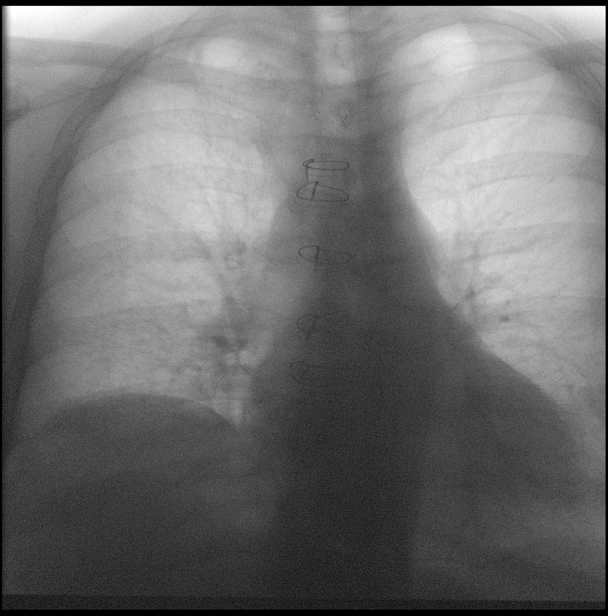
[frame 36/71]
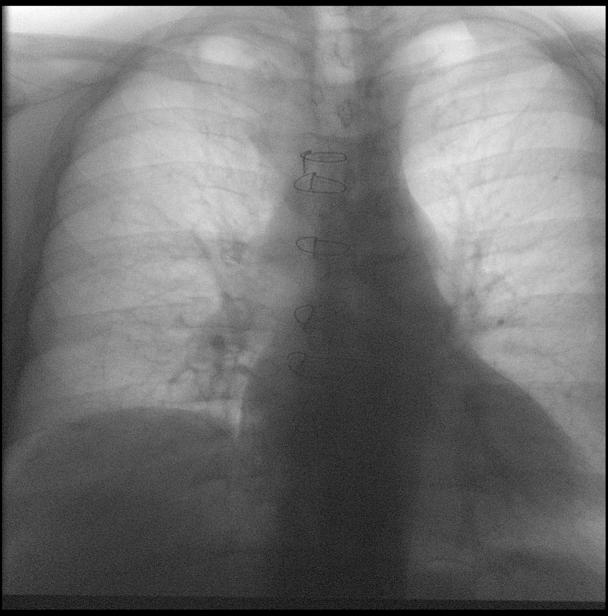
[frame 61/71]
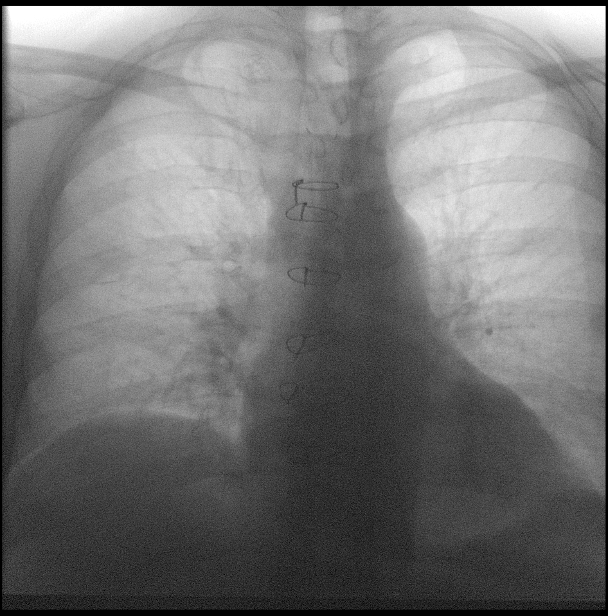

[Series 4: cp_chest · 0.26mm/px · 4 of 55 frames shown (2 of 2)]
[frame 4/55]
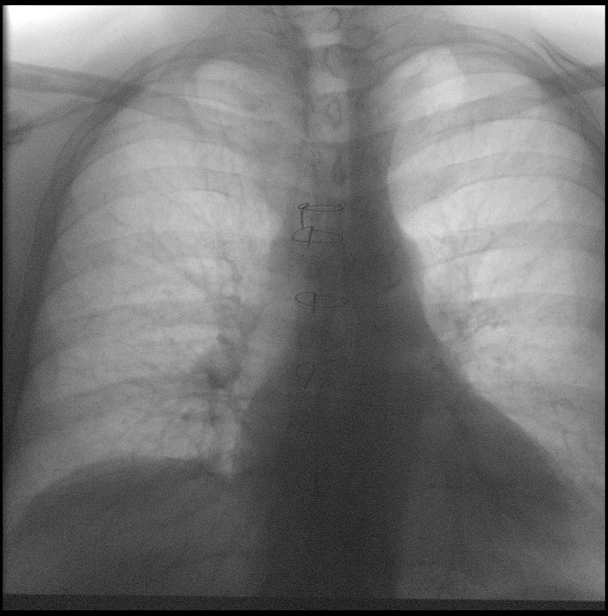
[frame 9/55]
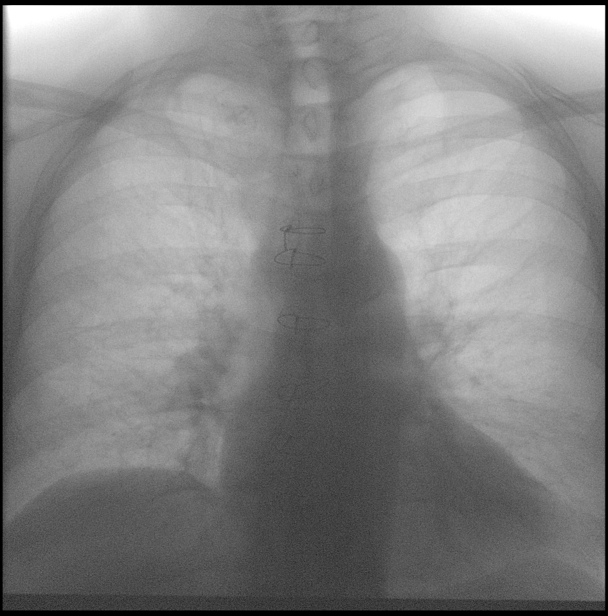
[frame 28/55]
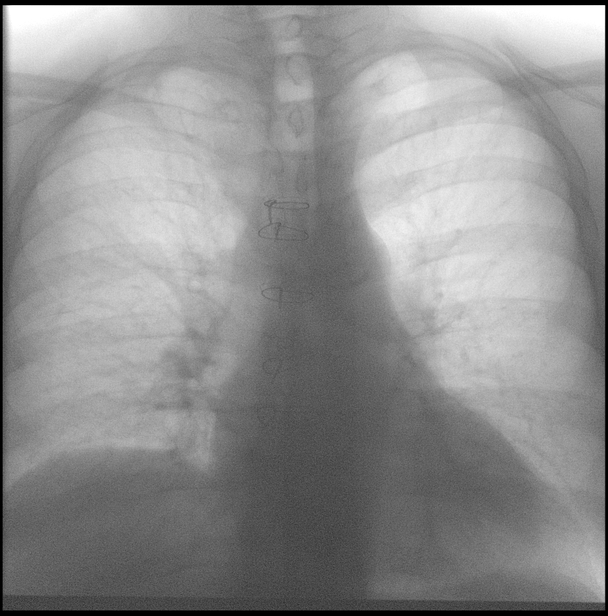
[frame 47/55]
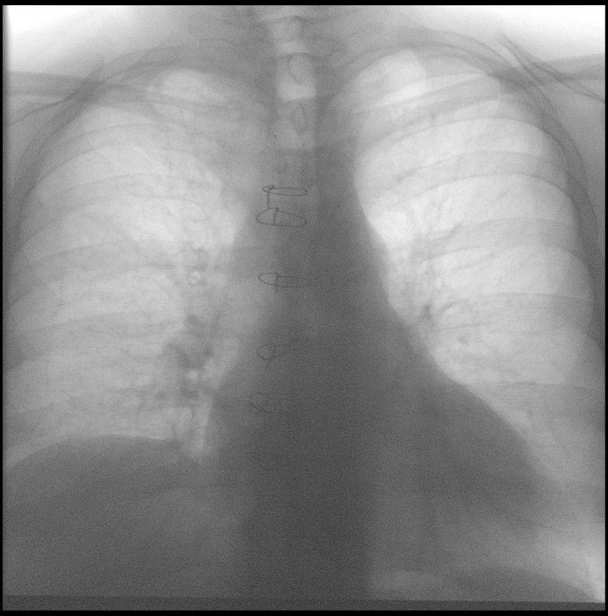

[12 of 12 positions shown; findings below may reference images not displayed]

FINDINGS: Initial evaluation again demonstrates elevation of the right
hemidiaphragm compared to the left. Full inspiration demonstrates
normal diaphragmatic motion bilaterally. Sniff test shows no
paradoxical motion.
IMPRESSION: Elevation of the right hemidiaphragm without evidence of paradoxical
motion. No other focal abnormality is noted.

## 2022-04-05 ENCOUNTER — Other Ambulatory Visit (INDEPENDENT_AMBULATORY_CARE_PROVIDER_SITE_OTHER): Payer: Self-pay | Admitting: Vascular Surgery

## 2022-04-05 DIAGNOSIS — I739 Peripheral vascular disease, unspecified: Secondary | ICD-10-CM

## 2022-04-08 ENCOUNTER — Other Ambulatory Visit: Payer: Self-pay

## 2022-04-08 MED ORDER — CARVEDILOL 12.5 MG PO TABS: ORAL_TABLET | ORAL | 0 refills | Status: DC

## 2022-04-12 ENCOUNTER — Ambulatory Visit (INDEPENDENT_AMBULATORY_CARE_PROVIDER_SITE_OTHER): Payer: Managed Care, Other (non HMO) | Admitting: Vascular Surgery

## 2022-04-12 ENCOUNTER — Ambulatory Visit (INDEPENDENT_AMBULATORY_CARE_PROVIDER_SITE_OTHER): Payer: Managed Care, Other (non HMO)

## 2022-04-12 ENCOUNTER — Encounter (INDEPENDENT_AMBULATORY_CARE_PROVIDER_SITE_OTHER): Payer: Self-pay | Admitting: Vascular Surgery

## 2022-04-12 VITALS — BP 155/78 | HR 66 | Resp 16 | Wt 205.6 lb

## 2022-04-12 DIAGNOSIS — I739 Peripheral vascular disease, unspecified: Secondary | ICD-10-CM

## 2022-04-12 DIAGNOSIS — I890 Lymphedema, not elsewhere classified: Secondary | ICD-10-CM

## 2022-04-12 DIAGNOSIS — Z794 Long term (current) use of insulin: Secondary | ICD-10-CM

## 2022-04-12 DIAGNOSIS — I10 Essential (primary) hypertension: Secondary | ICD-10-CM

## 2022-04-12 DIAGNOSIS — E1140 Type 2 diabetes mellitus with diabetic neuropathy, unspecified: Secondary | ICD-10-CM

## 2022-04-12 NOTE — Progress Notes (Signed)
MRN : [id number]  [name] [name] is a 61 y.o. (1961/04/15) male who presents with chief complaint of  Chief Complaint  Patient presents with   Follow-up    Ultrasound follow up  .  History of Present Illness: Patient returns today in follow up of his peripheral arterial disease.  He is doing well.  He has progressed to end-stage renal disease and is now on peritoneal dialysis and this is working well.  He has no lower extremity symptoms at current.  His leg swelling is much better.  No ulceration or infection.  No fevers or chills.  ABIs today are 0.93 on the left and 1.03 on the right with normal digital pressures.  Current Outpatient Medications  Medication Sig Dispense Refill   aspirin EC 81 MG tablet Take 81 mg by mouth daily. Swallow whole.     carvedilol (COREG) 12.5 MG tablet TAKE 18.5 MG  (1 1/2 PILL) TWICE A DAY 270 tablet 0   cholecalciferol (VITAMIN D3) 25 MCG (1000 UNIT) tablet Take 2,000 Units by mouth 2 (two) times daily.     docusate calcium (SURFAK) 240 MG capsule Take 240 mg by mouth daily.     Evolocumab (REPATHA SURECLICK) 673 MG/ML SOAJ Inject 1 Dose into the skin every 14 (fourteen) days. (Patient taking differently: Inject 1 Dose into the skin every 14 (fourteen) days.) 2 mL 6   ezetimibe (ZETIA) 10 MG tablet Take 1 tablet (10 mg total) by mouth daily. 90 tablet 0   hydrALAZINE (APRESOLINE) 50 MG tablet Take 1 tablet (50 mg total) by mouth 3 (three) times daily. 270 tablet 3   Insulin Pen Needle (B-D ULTRAFINE III SHORT PEN) 31G X 8 MM MISC To use with pen basal and mealtime coverage insulin pens 5 times daily. DX: E11.65 500 each 1   isosorbide mononitrate (IMDUR) 30 MG 24 hr tablet Take 1 tablet (30 mg total) by mouth 2 (two) times daily. 180 tablet 0   losartan (COZAAR) 25 MG tablet Take 25 mg by mouth every evening.     nortriptyline (PAMELOR) 10 MG capsule Take 50 mg by mouth at bedtime.     NOVOLOG FLEXPEN 100 UNIT/ML FlexPen Inject into the skin 3 (three)  times daily with meals. 3 x 's daily with meals - SSI     pantoprazole (PROTONIX) 40 MG tablet Take 1 tablet (40 mg total) by mouth 2 (two) times daily before a meal. 180 tablet 3   Ruxolitinib Phosphate (OPZELURA) 1.5 % CREA Apply to skin daily, legs (Patient taking differently: Apply 1 Application topically daily. Apply to skin daily, legs) 60 g 2   torsemide (DEMADEX) 20 MG tablet Take 40 mg daily (two tabs), may take additional 40 mg (two tabs) in the evening for swelling to lower legs (Patient taking differently: Take 20 mg by mouth 2 (two) times daily. Take 40 mg daily (two tabs), may take additional 40 mg (two tabs) in the evening for swelling to lower legs) 200 tablet 3   TRESIBA FLEXTOUCH 200 UNIT/ML FlexTouch Pen 100 Units at bedtime.     No current facility-administered medications for this visit.    Past Medical History:  Diagnosis Date   Anemia    Arthritis    CAD (coronary artery disease)    a. 12/2016 s/p CABG x 4 (LIMA->LAD, VG->RCA, VG->OM1, VG->D1); b. 02/2018 MV: EF 35%, small-med inferolaterlal infarct. No ischemia.   CKD (chronic kidney disease), stage IV (HCC)    Colon polyps  COPD (chronic obstructive pulmonary disease) (HCC)    Dupre's syndrome    GERD (gastroesophageal reflux disease)    HFrEF (heart failure reduced ejection fraction) ([place])    a.) 2018 EF 35%; b.) 02/2018 EF 50%; c). 02/2019 EF 40-45%; d.) 08/2019 Echo: EF 50-55%, no rwma, GrII DD; e.) 04/2020 Echo: EF 30-35%, mod-sev glob HK. Mild LVH. G2DD. Low-nl RV fxn. Mildly dil LA. Mild MR. Mild-mod Ao sclerosis w/o stenosis; f.) TTE 02/09/2021: EF 55%; septal HK, LAE; G2DD.   History of 2019 novel coronavirus disease (COVID-19) 02/08/2021   Hyperlipidemia    Hypertension    Ischemic cardiomyopathy    a.) 02/2018 MV: EF 35%; b.) 08/2019 Echo: EF 50-55%; c.) 04/2020 Echo: EF 30-35%; d.) TTE 02/09/2021: EF 50-55%.   Lymphedema    Migraine    Myocardial infarction [facility])    Neuropathy    PAD (peripheral  artery disease) ([place])    a. 04/2017 Aortoiliac duplex: R iliac dzs; b. 03/2020 LE Duplex: mild bilat atherosclerosis throughout. Patent vessels.   Pneumonia    Retinopathy    S/P CABG x 4    a.)  4v CABG: LIMA->LAD, SVG->RCA, SVG->OM1, SVG->D1   Sleep apnea    a.) does not require nocturnal PAP therapy   Statin intolerance    Stomach ulcer    T2DM (type 2 diabetes mellitus) ([place])     Past Surgical History:  Procedure Laterality Date   bone graft surgery Bilateral    on both feet x 2   CAPD INSERTION N/A 11/13/2021   Procedure: LAPAROSCOPIC INSERTION CONTINUOUS AMBULATORY PERITONEAL DIALYSIS  (CAPD) CATHETER;  Surgeon: Jules Husbands, MD;  Location: [facility] ORS;  Service: General;  Laterality: N/A;   CAPD REVISION N/A 01/10/2022   Procedure: LAPAROSCOPIC REVISION CONTINUOUS AMBULATORY PERITONEAL DIALYSIS  (CAPD) CATHETER;  Surgeon: Jules Husbands, MD;  Location: [facility] ORS;  Service: General;  Laterality: N/A;   CARDIAC CATHETERIZATION  2013   S/p PCI    CARDIAC CATHETERIZATION  2018   S/p CABG   CATARACT EXTRACTION Bilateral    CORONARY ARTERY BYPASS GRAFT  2018   (LIMA-LAD,VG-RCA,VG-OM1,VG-D1)   ESOPHAGOGASTRODUODENOSCOPY (EGD) WITH PROPOFOL N/A 04/04/2020   Procedure: ESOPHAGOGASTRODUODENOSCOPY (EGD) WITH PROPOFOL;  Surgeon: Lucilla Lame, MD;  Location: [facility] ENDOSCOPY;  Service: Endoscopy;  Laterality: N/A;   ESOPHAGOGASTRODUODENOSCOPY (EGD) WITH PROPOFOL N/A 06/06/2020   Procedure: ESOPHAGOGASTRODUODENOSCOPY (EGD) WITH PROPOFOL;  Surgeon: Lucilla Lame, MD;  Location: [facility] ENDOSCOPY;  Service: Endoscopy;  Laterality: N/A;   PERIPHERAL VASCULAR CATHETERIZATION Right 10/28/2016   PTA/DEB Right SFA   WRIST SURGERY Left    cyst     Social History   Tobacco Use   Smoking status: Some Days    Packs/day: 0.25    Years: 39.00    Total pack years: 9.75    Types: Cigarettes    Passive exposure: Past   Smokeless tobacco: Never  Vaping Use   Vaping Use: Never used  Substance Use  Topics   Alcohol use: Yes    Comment: very rarely - wine   Drug use: Never      Family History  Problem Relation Age of Onset   Diabetes Mother    Heart disease Father     Allergies  Allergen Reactions   Iodinated Contrast Media Other (See Comments)    Kidney disease  Kidney disease  Kidney disease  Kidney disease   Other Other (See Comments)    Pain  With joint stiffness     Statins Other (See  Comments)    Pain  With joint stiffness  Pain  With joint stiffness    Pregabalin Other (See Comments)    Generalized aches and pains Generalized aches and pains Generalized aches and pains Generalized aches and pains Generalized aches and pains   Sacubitril-Valsartan Other (See Comments)    Hyperkalemia  Hyperkalemia Hyperkalemia  Hyperkalemia   Atorvastatin Other (See Comments)    Muscle aches Muscle aches   Pravastatin Other (See Comments)    Muscle aches Muscle aches   Rosuvastatin Other (See Comments)    Muscle Aches Other reaction(s): JOINT PAIN Other reaction(s): JOINT PAIN Muscle Aches     REVIEW OF SYSTEMS (Negative unless checked)   Constitutional: [] Weight loss  [] Fever  [] Chills Cardiac: [] Chest pain   [] Chest pressure   [x] Palpitations   [] Shortness of breath when laying flat   [] Shortness of breath at rest   [x] Shortness of breath with exertion. Vascular:  [] Pain in legs with walking   [] Pain in legs at rest   [] Pain in legs when laying flat   [] Claudication   [] Pain in feet when walking  [] Pain in feet at rest  [] Pain in feet when laying flat   [] History of DVT   [] Phlebitis   [x] Swelling in legs   [] Varicose veins   [] Non-healing ulcers Pulmonary:   [] Uses home oxygen   [] Productive cough   [] Hemoptysis   [] Wheeze  [] COPD   [] Asthma Neurologic:  [] Dizziness  [] Blackouts   [] Seizures   [] History of stroke   [] History of TIA  [] Aphasia   [] Temporary blindness   [] Dysphagia   [] Weakness or numbness in arms   [x] Weakness or numbness in legs Musculoskeletal:   [] Arthritis   [] Joint swelling   [] Joint pain   [] Low back pain Hematologic:  [] Easy bruising  [] Easy bleeding   [] Hypercoagulable state   [x] Anemic  [] Hepatitis Gastrointestinal:  [] Blood in stool   [] Vomiting blood  [] Gastroesophageal reflux/heartburn   [] Abdominal pain Genitourinary:  [x] Chronic kidney disease   [] Difficult urination  [] Frequent urination  [] Burning with urination   [] Hematuria Skin:  [] Rashes   [] Ulcers   [] Wounds Psychological:  [] History of anxiety   []  History of major depression.  Physical Examination  BP (!) 155/78 (BP Location: Right Arm)   Pulse 66   Resp 16   Wt 205 lb 9.6 oz (93.3 kg)   BMI 26.40 kg/m  Gen:  WD/WN, NAD. Appears older than stated age. Head: [place]/AT, No temporalis wasting. Ear/Nose/Throat: Hearing grossly intact, nares w/o erythema or drainage Eyes: Conjunctiva clear. Sclera non-icteric Neck: Supple.  Trachea midline Pulmonary:  Good air movement, no use of accessory muscles.  Cardiac: RRR, no JVD Vascular:  Vessel Right Left  Radial Palpable Palpable                          PT Palpable Palpable  DP Palpable Palpable   Gastrointestinal: soft, non-tender/non-distended. No guarding/reflex.  Musculoskeletal: M/S 5/5 throughout.  No deformity or atrophy. No edema. Using a motorized wheelchair Neurologic: Sensation grossly intact in extremities.  Symmetrical.  Speech is fluent.  Psychiatric: Judgment intact, Mood & affect appropriate for pt's clinical situation. Dermatologic: No rashes or ulcers noted.  No cellulitis or open wounds.      Labs No results found for this or any previous visit (from the past 2160 hour(s)).  Radiology No results found.  Assessment/Plan Diabetes (HCC) blood glucose control important in reducing the progression of atherosclerotic disease. Also, involved in  wound healing. On appropriate medications.     Essential hypertension blood pressure control important in reducing the progression of  atherosclerotic disease. On appropriate oral medications  Lymphedema Even with his decreased mobility with his foot drop, his swelling is still under pretty good control.  He has been elevating and using compression well.   PAD (peripheral artery disease) (HCC) ABIs today remain in the normal range at 1.03 on the right and 0.93 on the left with digit pressures of over 100.  No worrisome PAD symptoms at this point.  Recheck in 1 year.   Leotis Pain, MD  04/12/2022 11:08 AM    This note was created with Dragon medical transcription system.  Any errors from dictation are purely unintentional

## 2022-04-17 ENCOUNTER — Other Ambulatory Visit: Payer: Self-pay | Admitting: *Deleted

## 2022-04-17 MED ORDER — TORSEMIDE 20 MG PO TABS: ORAL_TABLET | ORAL | 0 refills | Status: DC

## 2022-04-24 ENCOUNTER — Ambulatory Visit: Payer: Managed Care, Other (non HMO) | Admitting: Podiatry

## 2022-04-24 ENCOUNTER — Encounter: Payer: Self-pay | Admitting: Podiatry

## 2022-04-24 DIAGNOSIS — B351 Tinea unguium: Secondary | ICD-10-CM

## 2022-04-24 DIAGNOSIS — E1142 Type 2 diabetes mellitus with diabetic polyneuropathy: Secondary | ICD-10-CM | POA: Diagnosis not present

## 2022-04-24 DIAGNOSIS — M2041 Other hammer toe(s) (acquired), right foot: Secondary | ICD-10-CM

## 2022-04-24 DIAGNOSIS — M79676 Pain in unspecified toe(s): Secondary | ICD-10-CM

## 2022-04-24 DIAGNOSIS — Z794 Long term (current) use of insulin: Secondary | ICD-10-CM

## 2022-04-24 DIAGNOSIS — M2042 Other hammer toe(s) (acquired), left foot: Secondary | ICD-10-CM

## 2022-04-24 NOTE — Progress Notes (Signed)
He presents today for follow-up of his feet and nail trim.  He states that they are really not any worse and not any better. bumped his toes a couple of times and he would like to consider new diabetic shoes.  Objective: Vital signs are stable alert and oriented x 3.  There is positive erythema no cellulitic process he does have superficial ulcerative lesions which are chronic and longtime healing wounds to the toes bilaterally currently nothing appears to be infected.  Toenails are long and thick.  Assessment: Severe peripheral vascular disease.  Severe peripheral neuropathy.  Painful mycotic nails.  Ischemic ulcerative lesions.  Plan: Debrided all nails bilaterally today and he will follow-up for diabetic shoes in January.

## 2022-05-10 NOTE — Progress Notes (Unsigned)
Cardiology Office Note  Date:  05/13/2022   ID:  [name] [name], DOB 20-Mar-1961, MRN [id number]  PCP:  [name] Osgood, NP   Chief Complaint  Patient presents with   6 month follow up     "Doing well." Medications reviewed by the patient verbally.     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), HTN,  HLD,  GERD Former smoker quit 2013 Sleep apnea, unable to tolerate mask Statin intolerance Chronic renal insuff secondary to poorly controlled diabetes Who presents for routine follow-up of his coronary artery disease, history of bypass  LOV June 2023 On peritoneal diaylsis, doing well, tolerating dialysis every evening, wife helps  Reports dramatic improvement in his A1c, previously greater than 10 A1C most recently running 8.2, with lab work done through [facility],  has made dramatic changes to his diet   Has dexcom monitor on his arm which gives him instant feedback after eating certain things Having some sugar spikes  Labile blood pressure Sometimes low, 416 systolic is lowest, Highest measured was 180, average appears to be 1 60-6 50 systolic  On torsemide 20 BID, denies significant lower extremity edema No PND orthopnea  Echo 9/22 Left ventricular ejection fraction, by estimation, is 50 to 55%.  Lipid panel reviewed  on Praluent and Zetia down to 200 No recent labs since A1c has improved  Prior history of sleep apnea, CPAP and bipap did not work, Working with Dr. Humphrey Rolls  EKG personally reviewed by myself on todays visit Normal sinus rhythm rate 67 bpm nonspecific ST abnormality V6, 1 and aVL  Other past medical history reviewed Covid: in the hospital 12/2020 hypoxia, cough, weakness, shortness of breath, COVID-positive, elevated troponin  Echo 04/2020 EF 30 to 35%  Medication intolerances On farxiga/jardiance gets "pancreatitis" Stopped  hydralazine on his own, "intolerance"  Echocardiogram 08/2019  normal LV function, no evidence of valve disease or elevated right heart pressures  Outside notes from [place] [facility]  initial ejection fraction 35% presumably in 2018 Ejection fraction 50% in October 2019 Notes indicating ejection fraction 40 to 45% October 2020 [place] Myoview 2019 showing small to moderate inferolateral infarction without ischemia  Notes indicate previously unable to increase Entresto secondary to high potassium level  CABG details from [place] LIMA to the LAD Vein graft to the RCA Vein graft OM1 Vein graft to D1  Statin myalgias, rheumatoid issue  PMH:   has a past medical history of Anemia, Arthritis, CAD (coronary artery disease), CKD (chronic kidney disease), stage IV ([place]), Colon polyps, COPD (chronic obstructive pulmonary disease) ([place]), Dupre's syndrome, GERD (gastroesophageal reflux disease), HFrEF (heart failure reduced ejection fraction) ([place]), History of 2019 novel coronavirus disease (COVID-19) (02/08/2021), Hyperlipidemia, Hypertension, Ischemic cardiomyopathy, Lymphedema, Migraine, Myocardial infarction ([place]), Neuropathy, PAD (peripheral artery disease) ([place]), Pneumonia, Retinopathy, S/P CABG x 4, Sleep apnea, Statin intolerance, Stomach ulcer, and T2DM (type 2 diabetes mellitus) ([place]).  PSH:    Past Surgical History:  Procedure Laterality Date   bone graft surgery Bilateral    on both feet x 2   CAPD INSERTION N/A 11/13/2021   Procedure: LAPAROSCOPIC INSERTION CONTINUOUS AMBULATORY PERITONEAL DIALYSIS  (CAPD) CATHETER;  Surgeon: [name] Husbands, MD;  Location: [facility] ORS;  Service: General;  Laterality: N/A;   CAPD REVISION N/A 01/10/2022   Procedure: LAPAROSCOPIC REVISION CONTINUOUS AMBULATORY PERITONEAL DIALYSIS  (CAPD) CATHETER;  Surgeon: [name] Husbands, MD;  Location: [facility]  ORS;  Service: General;  Laterality: N/A;   CARDIAC CATHETERIZATION  2013   S/p PCI    CARDIAC  CATHETERIZATION  2018   S/p CABG   CATARACT EXTRACTION Bilateral    CORONARY ARTERY BYPASS GRAFT  2018   (LIMA-LAD,VG-RCA,VG-OM1,VG-D1)   ESOPHAGOGASTRODUODENOSCOPY (EGD) WITH PROPOFOL N/A 04/04/2020   Procedure: ESOPHAGOGASTRODUODENOSCOPY (EGD) WITH PROPOFOL;  Surgeon: Lucilla Lame, MD;  Location: [facility] END[facility];  Service: Endoscopy;  Laterality: N/A;   ESOPHAGOGASTRODUODENOSCOPY (EGD) WITH PROPOFOL N/A 06/06/2020   Procedure: ESOPHAGOGASTRODUODENOSCOPY (EGD) WITH PROPOFOL;  Surgeon: Lucilla Lame, MD;  Location: [facility] END[facility]COPY;  Service: Endoscopy;  Laterality: N/A;   PERIPHERAL VASCULAR CATHETERIZATION Right 10/28/2016   PTA/DEB Right SFA   WRIST SURGERY Left    cyst    Current Outpatient Medications  Medication Sig Dispense Refill   aspirin EC 81 MG tablet Take 81 mg by mouth daily. Swallow whole.     carvedilol (COREG) 12.5 MG tablet TAKE 18.5 MG  (1 1/2 PILL) TWICE A DAY 270 tablet 0   cholecalciferol (VITAMIN D3) 25 MCG (1000 UNIT) tablet Take 2,000 Units by mouth 2 (two) times daily.     docusate calcium (SURFAK) 240 MG capsule Take 240 mg by mouth daily.     Evolocumab (REPATHA SURECLICK) 226 MG/ML SOAJ Inject 1 Dose into the skin every 14 (fourteen) days. (Patient taking differently: Inject 1 Dose into the skin every 14 (fourteen) days.) 2 mL 6   ezetimibe (ZETIA) 10 MG tablet Take 1 tablet (10 mg total) by mouth daily. 90 tablet 0   hydrALAZINE (APRESOLINE) 50 MG tablet Take 1 tablet (50 mg total) by mouth 3 (three) times daily. 270 tablet 3   Insulin Pen Needle (B-D ULTRAFINE III SHORT PEN) 31G X 8 MM MISC To use with pen basal and mealtime coverage insulin pens 5 times daily. DX: E11.65 500 each 1   isosorbide mononitrate (IMDUR) 30 MG 24 hr tablet Take 1 tablet (30 mg total) by mouth 2 (two) times daily. 180 tablet 0   losartan (COZAAR) 25 MG tablet Take 25 mg by mouth every evening.     nortriptyline (PAMELOR) 10 MG capsule Take 50 mg by mouth at bedtime.     NOVOLOG FLEXPEN  100 UNIT/ML FlexPen Inject into the skin 3 (three) times daily with meals. 3 x 's daily with meals - SSI     pantoprazole (PROTONIX) 40 MG tablet Take 1 tablet (40 mg total) by mouth 2 (two) times daily before a meal. 180 tablet 3   Ruxolitinib Phosphate (OPZELURA) 1.5 % CREA Apply to skin daily, legs (Patient taking differently: Apply 1 Application topically daily. Apply to skin daily, legs) 60 g 2   TRESIBA FLEXTOUCH 200 UNIT/ML FlexTouch Pen 100 Units at bedtime.     torsemide (DEMADEX) 20 MG tablet Take 20 mg twice a day 180 tablet 3   No current facility-administered medications for this visit.    Allergies:   Iodinated contrast media, Other, Statins, Pregabalin, Sacubitril-valsartan, Atorvastatin, Pravastatin, and Rosuvastatin   Social History:  The patient  reports that he has been smoking cigarettes. He has a 9.75 pack-year smoking history. He has been exposed to tobacco smoke. He has never used smokeless tobacco. He reports current alcohol use. He reports that he does not use drugs.   Family History:   family history includes Diabetes in his mother; Heart disease in his father.    Review of Systems: Review of Systems  Constitutional: Negative.   HENT: Negative.  Respiratory: Negative.    Cardiovascular: Negative.   Gastrointestinal: Negative.   Musculoskeletal: Negative.        Leg weakness, sores on legs  Neurological: Negative.   Psychiatric/Behavioral: Negative.    All other systems reviewed and are negative.  PHYSICAL EXAM: VS:  BP 130/68 (BP Location: Left Arm, Patient Position: Sitting, Cuff Size: Normal)   Pulse 67   Ht 6\' 2"  (1.88 m)   Wt 208 lb 6 oz (94.5 kg)   SpO2 94%   BMI 26.75 kg/m  , BMI Body mass index is 26.75 kg/m. Constitutional:  oriented to person, place, and time. No distress.  HENT:  Head: Grossly normal Eyes:  no discharge. No scleral icterus.  Neck: No JVD, no carotid bruits  Cardiovascular: Regular rate and rhythm, no murmurs  appreciated Pulmonary/Chest: Clear to auscultation bilaterally, no wheezes or rails Abdominal: Soft.  no distension.  no tenderness.  Musculoskeletal: Normal range of motion Neurological:  normal muscle tone. Coordination normal. No atrophy Skin: Skin warm and dry Psychiatric: normal affect, pleasant  Recent Labs: 01/10/2022: BUN 37; Creatinine, Ser 4.50; Hemoglobin 13.3; Potassium 3.6; Sodium 140    Lipid Panel Lab Results  Component Value Date   CHOL 201 (H) 12/07/2020   HDL 37 (L) 12/07/2020   LDLCALC 100 (H) 12/07/2020   TRIG 381 (H) 12/07/2020    Wt Readings from Last 3 Encounters:  05/13/22 208 lb 6 oz (94.5 kg)  04/12/22 205 lb 9.6 oz (93.3 kg)  01/30/22 205 lb 0.4 oz (93 kg)     ASSESSMENT AND PLAN:  Problem List Items Addressed This Visit       Cardiology Problems   Coronary artery disease with history of myocardial infarction without history of CABG (Chronic)   Relevant Medications   torsemide (DEMADEX) 20 MG tablet   Other Relevant Orders   EKG 68-TMHD   Chronic systolic CHF (congestive heart failure) (HCC)   Relevant Medications   torsemide (DEMADEX) 20 MG tablet   Essential hypertension   Relevant Medications   torsemide (DEMADEX) 20 MG tablet   Other Relevant Orders   EKG 12-Lead     Other   Diabetes mellitus with stage 4 chronic kidney disease GFR 15-29 (HCC) (Chronic)   Relevant Orders   EKG 12-Lead   Lymphedema   Relevant Orders   EKG 12-Lead   Diabetes ([place])   Other Visit Diagnoses     Chronic diastolic heart failure ([place])    -  Primary   Relevant Medications   torsemide (DEMADEX) 20 MG tablet   Other Relevant Orders   EKG 12-Lead   Leg swelling       Aortic atherosclerosis (HCC)       Relevant Medications   torsemide (DEMADEX) 20 MG tablet     End-stage renal disease on dialysis Tolerating peritoneal dialysis Lab work done through nephrology  CAD with stable angina Ejection fraction 50% Continue Zetia with Praluent Repeat  lipid panel ordered by primary care, goal LDL less than 70 Suspect there will be some improvement with better diabetes control  Acute on chronic diastolic CHF In the setting of renal failure Plan to torsemide 20 twice daily, appears euvolemic  Essential hypertension Blood pressure is well controlled on today's visit. No changes made to the medications.  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 Candiss Norse [place] Comment dialysis  Hyperlipidemia Continue Zetia Praluent Praluent 150 every 2 weeks Goal total cholesterol less than 150   Total encounter time more than 30 minutes  Greater than 50% was spent in counseling and coordination of care with the patient    Signed, Esmond Plants, M.D., Ph.D. [place], [place]

## 2022-05-13 ENCOUNTER — Encounter: Payer: Self-pay | Admitting: Cardiovascular Disease

## 2022-05-13 ENCOUNTER — Ambulatory Visit: Payer: Managed Care, Other (non HMO) | Attending: Cardiovascular Disease | Admitting: Cardiovascular Disease

## 2022-05-13 VITALS — BP 130/68 | HR 67 | Ht 74.0 in | Wt 208.4 lb

## 2022-05-13 DIAGNOSIS — E1122 Type 2 diabetes mellitus with diabetic chronic kidney disease: Secondary | ICD-10-CM | POA: Diagnosis not present

## 2022-05-13 DIAGNOSIS — I5032 Chronic diastolic (congestive) heart failure: Secondary | ICD-10-CM | POA: Diagnosis not present

## 2022-05-13 DIAGNOSIS — M7989 Other specified soft tissue disorders: Secondary | ICD-10-CM

## 2022-05-13 DIAGNOSIS — Z794 Long term (current) use of insulin: Secondary | ICD-10-CM

## 2022-05-13 DIAGNOSIS — I10 Essential (primary) hypertension: Secondary | ICD-10-CM

## 2022-05-13 DIAGNOSIS — I890 Lymphedema, not elsewhere classified: Secondary | ICD-10-CM

## 2022-05-13 DIAGNOSIS — I2510 Atherosclerotic heart disease of native coronary artery without angina pectoris: Secondary | ICD-10-CM

## 2022-05-13 DIAGNOSIS — I252 Old myocardial infarction: Secondary | ICD-10-CM

## 2022-05-13 DIAGNOSIS — I5022 Chronic systolic (congestive) heart failure: Secondary | ICD-10-CM

## 2022-05-13 DIAGNOSIS — I700 Atherosclerosis of aorta: Secondary | ICD-10-CM

## 2022-05-13 DIAGNOSIS — N184 Chronic kidney disease, stage 4 (severe): Secondary | ICD-10-CM

## 2022-05-13 MED ORDER — TORSEMIDE 20 MG PO TABS: ORAL_TABLET | ORAL | 3 refills | Status: DC

## 2022-05-13 NOTE — Patient Instructions (Signed)
Medication Instructions:  No changes  If you need a refill on your cardiac medications before your next appointment, please call your pharmacy.   Lab work: No new labs needed  Testing/Procedures: No new testing needed  Follow-Up: At [facility], you and your health needs are our priority.  As part of our continuing mission to provide you with exceptional heart care, we have created designated Provider Care Teams.  These Care Teams include your primary Cardiologist (physician) and Advanced Practice Providers (APPs -  Physician Assistants and Nurse Practitioners) who all work together to provide you with the care you need, when you need it.  You will need a follow up appointment in 12 months  Providers on your designated Care Team:   Christopher Berge, NP Ryan Dunn, PA-C Cadence Furth, PA-C  COVID-19 Vaccine Information can be found at: [email or web address] For questions related to vaccine distribution or appointments, please email [email or web address] or call [phone].   

## 2022-06-14 ENCOUNTER — Other Ambulatory Visit: Payer: Self-pay | Admitting: Nurse Practitioner

## 2022-06-20 ENCOUNTER — Other Ambulatory Visit: Payer: Self-pay | Admitting: Cardiovascular Disease

## 2022-06-20 DIAGNOSIS — I700 Atherosclerosis of aorta: Secondary | ICD-10-CM

## 2022-07-17 ENCOUNTER — Encounter: Payer: Self-pay | Admitting: Nurse Practitioner

## 2022-07-17 ENCOUNTER — Ambulatory Visit (INDEPENDENT_AMBULATORY_CARE_PROVIDER_SITE_OTHER): Payer: Managed Care, Other (non HMO) | Admitting: Nurse Practitioner

## 2022-07-17 VITALS — BP 124/65 | HR 60 | Temp 97.7°F | Resp 16 | Ht 74.0 in | Wt 208.0 lb

## 2022-07-17 DIAGNOSIS — E1122 Type 2 diabetes mellitus with diabetic chronic kidney disease: Secondary | ICD-10-CM

## 2022-07-17 DIAGNOSIS — I10 Essential (primary) hypertension: Secondary | ICD-10-CM

## 2022-07-17 DIAGNOSIS — N184 Chronic kidney disease, stage 4 (severe): Secondary | ICD-10-CM

## 2022-07-17 DIAGNOSIS — I700 Atherosclerosis of aorta: Secondary | ICD-10-CM

## 2022-07-17 DIAGNOSIS — Z992 Dependence on renal dialysis: Secondary | ICD-10-CM | POA: Diagnosis not present

## 2022-07-17 NOTE — Progress Notes (Signed)
[facility] [place], [place] [address]  Internal MEDICINE  Office Visit Note  Patient Name: [name] [name]  [id number]  [id number]  Date of Service: 07/17/2022  Chief Complaint  Patient presents with   Follow-up   Diabetes   Gastroesophageal Reflux   Hypertension   Hyperlipidemia    HPI Evon presents for a follow-up visit for routine follow up.  Gets most of his medications from his specialists -- kidney, cardiac and endo.  Does not need refills of pantoprazole at this time.  Still doing peritoneal dialysis, and not having any kidney issues Latest A1c was 8.0 with endocrinology.  Cardiovascular stable, next follow up is in a year.  Getting injections in eyes for diabetic retinopathy.  Can walk short distances but the neuropathy and bilateral foot drop is very painful. Ambulating with motorized scooter    Current Medication: Outpatient Encounter Medications as of 07/17/2022  Medication Sig Note   aspirin EC 81 MG tablet Take 81 mg by mouth daily. Swallow whole.    carvedilol (COREG) 12.5 MG tablet TAKE 18.5 MG  (1 1/2 PILL) TWICE A DAY    cholecalciferol (VITAMIN D3) 25 MCG (1000 UNIT) tablet Take 2,000 Units by mouth 2 (two) times daily.    docusate calcium (SURFAK) 240 MG capsule Take 240 mg by mouth daily.    ezetimibe (ZETIA) 10 MG tablet TAKE 1 TABLET(10 MG) BY MOUTH DAILY    hydrALAZINE (APRESOLINE) 50 MG tablet Take 1 tablet (50 mg total) by mouth 3 (three) times daily.    Insulin Pen Needle (B-D ULTRAFINE III SHORT PEN) 31G X 8 MM MISC To use with pen basal and mealtime coverage insulin pens 5 times daily. DX: E11.65    isosorbide mononitrate (IMDUR) 30 MG 24 hr tablet Take 1 tablet (30 mg total) by mouth 2 (two) times daily.    losartan (COZAAR) 25 MG tablet Take 25 mg by mouth every evening.    nortriptyline (PAMELOR) 10 MG capsule Take 50 mg by mouth at bedtime.    NOVOLOG FLEXPEN 100 UNIT/ML FlexPen Inject into the skin 3 (three)  times daily with meals. 3 x 's daily with meals - SSI    pantoprazole (PROTONIX) 40 MG tablet Take 1 tablet (40 mg total) by mouth 2 (two) times daily before a meal.    REPATHA SURECLICK [id number] MG/ML SOAJ INJECT 140 MG UNDER THE SKIN EVERY 14 DAYS    Ruxolitinib Phosphate (OPZELURA) 1.5 % CREA Apply to skin daily, legs (Patient taking differently: Apply 1 Application topically daily. Apply to skin daily, legs)    torsemide (DEMADEX) 20 MG tablet Take 20 mg twice a day    TRESIBA FLEXTOUCH 200 UNIT/ML FlexTouch Pen 100 Units at bedtime. 01/10/2022: 50 units last night   No facility-administered encounter medications on file as of 07/17/2022.    Surgical History: Past Surgical History:  Procedure Laterality Date   bone graft surgery Bilateral    on both feet x 2   CAPD INSERTION N/A 11/13/2021   Procedure: LAPAROSCOPIC INSERTION CONTINUOUS AMBULATORY PERITONEAL DIALYSIS  (CAPD) CATHETER;  Surgeon: Jules Husbands, MD;  Location: [facility] ORS;  Service: General;  Laterality: N/A;   CAPD REVISION N/A 01/10/2022   Procedure: LAPAROSCOPIC REVISION CONTINUOUS AMBULATORY PERITONEAL DIALYSIS  (CAPD) CATHETER;  Surgeon: Jules Husbands, MD;  Location: [facility] ORS;  Service: General;  Laterality: N/A;   CARDIAC CATHETERIZATION  2013   S/p PCI    CARDIAC CATHETERIZATION  2018   S/p CABG  CATARACT EXTRACTION Bilateral    CORONARY ARTERY BYPASS GRAFT  2018   (LIMA-LAD,VG-RCA,VG-OM1,VG-D1)   ESOPHAGOGASTRODUODENOSCOPY (EGD) WITH PROPOFOL N/A 04/04/2020   Procedure: ESOPHAGOGASTRODUODENOSCOPY (EGD) WITH PROPOFOL;  Surgeon: Lucilla Lame, MD;  Location: [facility] ENDOSCOPY;  Service: Endoscopy;  Laterality: N/A;   ESOPHAGOGASTRODUODENOSCOPY (EGD) WITH PROPOFOL N/A 06/06/2020   Procedure: ESOPHAGOGASTRODUODENOSCOPY (EGD) WITH PROPOFOL;  Surgeon: Lucilla Lame, MD;  Location: [facility] ENDOSCOPY;  Service: Endoscopy;  Laterality: N/A;   PERIPHERAL VASCULAR CATHETERIZATION Right 10/28/2016   PTA/DEB Right SFA   WRIST SURGERY Left     cyst    Medical History: Past Medical History:  Diagnosis Date   Anemia    Arthritis    CAD (coronary artery disease)    a. 12/2016 s/p CABG x 4 (LIMA->LAD, VG->RCA, VG->OM1, VG->D1); b. 02/2018 MV: EF 35%, small-med inferolaterlal infarct. No ischemia.   CKD (chronic kidney disease), stage IV (HCC)    Colon polyps    COPD (chronic obstructive pulmonary disease) (HCC)    Dupre's syndrome    GERD (gastroesophageal reflux disease)    HFrEF (heart failure reduced ejection fraction) ([place])    a.) 2018 EF 35%; b.) 02/2018 EF 50%; c). 02/2019 EF 40-45%; d.) 08/2019 Echo: EF 50-55%, no rwma, GrII DD; e.) 04/2020 Echo: EF 30-35%, mod-sev glob HK. Mild LVH. G2DD. Low-nl RV fxn. Mildly dil LA. Mild MR. Mild-mod Ao sclerosis w/o stenosis; f.) TTE 02/09/2021: EF 55%; septal HK, LAE; G2DD.   History of 2019 novel coronavirus disease (COVID-19) 02/08/2021   Hyperlipidemia    Hypertension    Ischemic cardiomyopathy    a.) 02/2018 MV: EF 35%; b.) 08/2019 Echo: EF 50-55%; c.) 04/2020 Echo: EF 30-35%; d.) TTE 02/09/2021: EF 50-55%.   Lymphedema    Migraine    Myocardial infarction [facility])    Neuropathy    PAD (peripheral artery disease) ([place])    a. 04/2017 Aortoiliac duplex: R iliac dzs; b. 03/2020 LE Duplex: mild bilat atherosclerosis throughout. Patent vessels.   Pneumonia    Retinopathy    S/P CABG x 4    a.)  4v CABG: LIMA->LAD, SVG->RCA, SVG->OM1, SVG->D1   Sleep apnea    a.) does not require nocturnal PAP therapy   Statin intolerance    Stomach ulcer    T2DM (type 2 diabetes mellitus) ([place])     Family History: Family History  Problem Relation Age of Onset   Diabetes Mother    Heart disease Father     Social History   Socioeconomic History   Marital status: Married    Spouse name: Roxanne   Number of children: Not on file   Years of education: Not on file   Highest education level: Not on file  Occupational History   Not on file  Tobacco Use   Smoking status: Former     Packs/day: 0.25    Years: 39.00    Total pack years: 9.75    Types: Cigarettes    Passive exposure: Past   Smokeless tobacco: Never  Vaping Use   Vaping Use: Never used  Substance and Sexual Activity   Alcohol use: Not Currently    Comment: very rarely - wine   Drug use: Never   Sexual activity: Not on file  Other Topics Concern   Not on file  Social History Narrative   Not on file   Social Determinants of Health   Financial Resource Strain: Not on file  Food Insecurity: Not on file  Transportation Needs: Not on file  Physical Activity:  Not on file  Stress: Not on file  Social Connections: Not on file  Intimate Partner Violence: Not on file      Review of Systems  Constitutional:  Negative for chills, fatigue and unexpected weight change.  HENT:  Negative for congestion, rhinorrhea, sneezing and sore throat.   Eyes:  Negative for redness.  Respiratory:  Negative for cough, chest tightness, shortness of breath and wheezing.   Cardiovascular:  Negative for chest pain and palpitations.  Gastrointestinal:  Negative for abdominal pain, constipation, diarrhea, nausea and vomiting.  Genitourinary:  Negative for dysuria and frequency.  Musculoskeletal:  Positive for arthralgias and gait problem. Negative for back pain, joint swelling and neck pain.  Skin:  Negative for rash.  Neurological:  Positive for weakness. Negative for tremors and numbness.  Hematological:  Negative for adenopathy. Does not bruise/bleed easily.  Psychiatric/Behavioral:  Negative for behavioral problems (Depression), sleep disturbance and suicidal ideas. The patient is not nervous/anxious.     Vital Signs: BP 124/65   Pulse 60   Temp 97.7 F (36.5 C)   Resp 16   Ht 6' 2"$  (1.88 m)   Wt 208 lb (94.3 kg)   SpO2 96%   BMI 26.71 kg/m    Physical Exam Vitals reviewed.  Constitutional:      General: He is not in acute distress.    Appearance: Normal appearance. He is not ill-appearing.  HENT:      Head: Normocephalic and atraumatic.  Eyes:     Extraocular Movements: Extraocular movements intact.     Pupils: Pupils are equal, round, and reactive to light.  Cardiovascular:     Rate and Rhythm: Normal rate and regular rhythm.  Pulmonary:     Effort: Pulmonary effort is normal. No respiratory distress.  Neurological:     Mental Status: He is alert and oriented to person, place, and time.  Psychiatric:        Mood and Affect: Mood normal.        Behavior: Behavior normal.        Assessment/Plan: 1. Diabetes mellitus with stage 4 chronic kidney disease GFR 15-29 (HCC) A1c improving, 8.0, managed by endocrinology.  2. Essential hypertension Stable, continue medications as prescribed  3. Aortic atherosclerosis ([place]) Takes repatha, managed by cardiology  4. Peritoneal dialysis catheter in place [facility]) Still doing CAPD as instructed by nephrology   General Counseling: Tillman Abide understanding of the findings of todays visit and agrees with plan of treatment. I have discussed any further diagnostic evaluation that may be needed or ordered today. We also reviewed his medications today. he has been encouraged to call the office with any questions or concerns that should arise related to todays visit.    No orders of the defined types were placed in this encounter.   No orders of the defined types were placed in this encounter.   Return for previously scheduled, CPE, Basir Niven PCP in august.   Total time spent:30 Minutes Time spent includes review of chart, medications, test results, and follow up plan with the patient.   [place] Controlled Substance Database was reviewed by me.  This patient was seen by Jonetta Osgood, FNP-C in collaboration with Dr. [name] as a part of collaborative care agreement.   Khristy Kalan R. Valetta Fuller, MSN, FNP-C Internal medicine

## 2022-07-18 ENCOUNTER — Telehealth: Payer: Self-pay | Admitting: Cardiovascular Disease

## 2022-07-18 MED ORDER — ISOSORBIDE MONONITRATE ER 30 MG PO TB24: 30.0000 mg | ORAL_TABLET | Freq: Two times a day (BID) | ORAL | 3 refills | Status: DC

## 2022-07-18 NOTE — Telephone Encounter (Signed)
Spoke with patient and reviewed I had spoke with the pharmacy. Advised he just needed refill and I have sent that in for him. He verbalized understanding and was appreciative.

## 2022-07-18 NOTE — Telephone Encounter (Signed)
Called pharmacy and they stated he must be confused because all he needs is refill. Advised that I would refill and update patient.

## 2022-07-18 NOTE — Telephone Encounter (Signed)
Pt c/o medication issue:  1. Name of Medication: isosorbide mononitrate (IMDUR) 30 MG 24 hr tablet   2. How are you currently taking this medication (dosage and times per day)?    3. Are you having a reaction (difficulty breathing--STAT)? no  4. What is your medication issue? Patient calling to see what other medication he can take. Patient's pharmacy states the medication is not longer a available. Please advise

## 2022-07-26 DIAGNOSIS — Z992 Dependence on renal dialysis: Secondary | ICD-10-CM | POA: Diagnosis not present

## 2022-07-26 DIAGNOSIS — N186 End stage renal disease: Secondary | ICD-10-CM | POA: Diagnosis not present

## 2022-07-27 DIAGNOSIS — Z992 Dependence on renal dialysis: Secondary | ICD-10-CM | POA: Diagnosis not present

## 2022-07-27 DIAGNOSIS — N186 End stage renal disease: Secondary | ICD-10-CM | POA: Diagnosis not present

## 2022-07-28 DIAGNOSIS — Z992 Dependence on renal dialysis: Secondary | ICD-10-CM | POA: Diagnosis not present

## 2022-07-28 DIAGNOSIS — N186 End stage renal disease: Secondary | ICD-10-CM | POA: Diagnosis not present

## 2022-07-29 ENCOUNTER — Other Ambulatory Visit: Payer: Self-pay | Admitting: Cardiovascular Disease

## 2022-07-29 DIAGNOSIS — N186 End stage renal disease: Secondary | ICD-10-CM | POA: Diagnosis not present

## 2022-07-29 DIAGNOSIS — Z992 Dependence on renal dialysis: Secondary | ICD-10-CM | POA: Diagnosis not present

## 2022-07-30 DIAGNOSIS — N186 End stage renal disease: Secondary | ICD-10-CM | POA: Diagnosis not present

## 2022-07-30 DIAGNOSIS — Z992 Dependence on renal dialysis: Secondary | ICD-10-CM | POA: Diagnosis not present

## 2022-07-31 DIAGNOSIS — Z992 Dependence on renal dialysis: Secondary | ICD-10-CM | POA: Diagnosis not present

## 2022-07-31 DIAGNOSIS — N186 End stage renal disease: Secondary | ICD-10-CM | POA: Diagnosis not present

## 2022-08-01 DIAGNOSIS — Z992 Dependence on renal dialysis: Secondary | ICD-10-CM | POA: Diagnosis not present

## 2022-08-01 DIAGNOSIS — N186 End stage renal disease: Secondary | ICD-10-CM | POA: Diagnosis not present

## 2022-08-02 DIAGNOSIS — Z992 Dependence on renal dialysis: Secondary | ICD-10-CM | POA: Diagnosis not present

## 2022-08-02 DIAGNOSIS — N186 End stage renal disease: Secondary | ICD-10-CM | POA: Diagnosis not present

## 2022-08-03 DIAGNOSIS — Z992 Dependence on renal dialysis: Secondary | ICD-10-CM | POA: Diagnosis not present

## 2022-08-03 DIAGNOSIS — N186 End stage renal disease: Secondary | ICD-10-CM | POA: Diagnosis not present

## 2022-08-04 DIAGNOSIS — N186 End stage renal disease: Secondary | ICD-10-CM | POA: Diagnosis not present

## 2022-08-04 DIAGNOSIS — Z992 Dependence on renal dialysis: Secondary | ICD-10-CM | POA: Diagnosis not present

## 2022-08-05 DIAGNOSIS — Z992 Dependence on renal dialysis: Secondary | ICD-10-CM | POA: Diagnosis not present

## 2022-08-05 DIAGNOSIS — N186 End stage renal disease: Secondary | ICD-10-CM | POA: Diagnosis not present

## 2022-08-06 DIAGNOSIS — D649 Anemia, unspecified: Secondary | ICD-10-CM | POA: Diagnosis not present

## 2022-08-06 DIAGNOSIS — Z992 Dependence on renal dialysis: Secondary | ICD-10-CM | POA: Diagnosis not present

## 2022-08-06 DIAGNOSIS — N186 End stage renal disease: Secondary | ICD-10-CM | POA: Diagnosis not present

## 2022-08-06 DIAGNOSIS — N250 Renal osteodystrophy: Secondary | ICD-10-CM | POA: Diagnosis not present

## 2022-08-07 ENCOUNTER — Ambulatory Visit: Payer: Medicare Other | Admitting: Podiatry

## 2022-08-07 ENCOUNTER — Encounter: Payer: Self-pay | Admitting: Podiatry

## 2022-08-07 DIAGNOSIS — E1142 Type 2 diabetes mellitus with diabetic polyneuropathy: Secondary | ICD-10-CM

## 2022-08-07 DIAGNOSIS — M79676 Pain in unspecified toe(s): Secondary | ICD-10-CM

## 2022-08-07 DIAGNOSIS — N186 End stage renal disease: Secondary | ICD-10-CM | POA: Diagnosis not present

## 2022-08-07 DIAGNOSIS — Z992 Dependence on renal dialysis: Secondary | ICD-10-CM | POA: Diagnosis not present

## 2022-08-07 DIAGNOSIS — Z794 Long term (current) use of insulin: Secondary | ICD-10-CM | POA: Diagnosis not present

## 2022-08-07 DIAGNOSIS — B351 Tinea unguium: Secondary | ICD-10-CM | POA: Diagnosis not present

## 2022-08-07 NOTE — Progress Notes (Signed)
He presents today for debridement of toenails bilaterally.  History of type 2 diabetes with polyneuropathy and peripheral vascular disease currently doing peritoneal dialysis twice a day.  Objective: Vital signs are stable alert oriented x 3.  Pulses are nonpalpable.  Capillary fill time is sluggish toenails are long thick yellow dystrophic onychomycotic no open lesions or wounds.  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  Assessment: Pain in limb secondary to elongated painful nails and diabetic peripheral neuropathy and angiopathy.  Plan: Debridement of nails bilateral follow-up with him on as-needed basis.

## 2022-08-08 DIAGNOSIS — Z794 Long term (current) use of insulin: Secondary | ICD-10-CM | POA: Diagnosis not present

## 2022-08-08 DIAGNOSIS — N186 End stage renal disease: Secondary | ICD-10-CM | POA: Diagnosis not present

## 2022-08-08 DIAGNOSIS — Z992 Dependence on renal dialysis: Secondary | ICD-10-CM | POA: Diagnosis not present

## 2022-08-08 DIAGNOSIS — E118 Type 2 diabetes mellitus with unspecified complications: Secondary | ICD-10-CM | POA: Diagnosis not present

## 2022-08-09 DIAGNOSIS — N186 End stage renal disease: Secondary | ICD-10-CM | POA: Diagnosis not present

## 2022-08-09 DIAGNOSIS — Z992 Dependence on renal dialysis: Secondary | ICD-10-CM | POA: Diagnosis not present

## 2022-08-10 DIAGNOSIS — N186 End stage renal disease: Secondary | ICD-10-CM | POA: Diagnosis not present

## 2022-08-10 DIAGNOSIS — Z992 Dependence on renal dialysis: Secondary | ICD-10-CM | POA: Diagnosis not present

## 2022-08-11 DIAGNOSIS — Z992 Dependence on renal dialysis: Secondary | ICD-10-CM | POA: Diagnosis not present

## 2022-08-11 DIAGNOSIS — N186 End stage renal disease: Secondary | ICD-10-CM | POA: Diagnosis not present

## 2022-08-12 DIAGNOSIS — N186 End stage renal disease: Secondary | ICD-10-CM | POA: Diagnosis not present

## 2022-08-12 DIAGNOSIS — Z992 Dependence on renal dialysis: Secondary | ICD-10-CM | POA: Diagnosis not present

## 2022-08-13 DIAGNOSIS — Z1330 Encounter for screening examination for mental health and behavioral disorders, unspecified: Secondary | ICD-10-CM | POA: Diagnosis not present

## 2022-08-13 DIAGNOSIS — E1122 Type 2 diabetes mellitus with diabetic chronic kidney disease: Secondary | ICD-10-CM | POA: Diagnosis not present

## 2022-08-13 DIAGNOSIS — Z992 Dependence on renal dialysis: Secondary | ICD-10-CM | POA: Diagnosis not present

## 2022-08-13 DIAGNOSIS — Z794 Long term (current) use of insulin: Secondary | ICD-10-CM | POA: Diagnosis not present

## 2022-08-13 DIAGNOSIS — E11319 Type 2 diabetes mellitus with unspecified diabetic retinopathy without macular edema: Secondary | ICD-10-CM | POA: Diagnosis not present

## 2022-08-13 DIAGNOSIS — E1169 Type 2 diabetes mellitus with other specified complication: Secondary | ICD-10-CM | POA: Diagnosis not present

## 2022-08-13 DIAGNOSIS — N186 End stage renal disease: Secondary | ICD-10-CM | POA: Diagnosis not present

## 2022-08-13 DIAGNOSIS — E785 Hyperlipidemia, unspecified: Secondary | ICD-10-CM | POA: Diagnosis not present

## 2022-08-13 DIAGNOSIS — E1140 Type 2 diabetes mellitus with diabetic neuropathy, unspecified: Secondary | ICD-10-CM | POA: Diagnosis not present

## 2022-08-14 DIAGNOSIS — N186 End stage renal disease: Secondary | ICD-10-CM | POA: Diagnosis not present

## 2022-08-14 DIAGNOSIS — Z992 Dependence on renal dialysis: Secondary | ICD-10-CM | POA: Diagnosis not present

## 2022-08-15 DIAGNOSIS — Z992 Dependence on renal dialysis: Secondary | ICD-10-CM | POA: Diagnosis not present

## 2022-08-15 DIAGNOSIS — N186 End stage renal disease: Secondary | ICD-10-CM | POA: Diagnosis not present

## 2022-08-16 DIAGNOSIS — N186 End stage renal disease: Secondary | ICD-10-CM | POA: Diagnosis not present

## 2022-08-16 DIAGNOSIS — Z992 Dependence on renal dialysis: Secondary | ICD-10-CM | POA: Diagnosis not present

## 2022-08-17 DIAGNOSIS — Z992 Dependence on renal dialysis: Secondary | ICD-10-CM | POA: Diagnosis not present

## 2022-08-17 DIAGNOSIS — N186 End stage renal disease: Secondary | ICD-10-CM | POA: Diagnosis not present

## 2022-08-18 DIAGNOSIS — N186 End stage renal disease: Secondary | ICD-10-CM | POA: Diagnosis not present

## 2022-08-18 DIAGNOSIS — Z992 Dependence on renal dialysis: Secondary | ICD-10-CM | POA: Diagnosis not present

## 2022-08-19 DIAGNOSIS — Z992 Dependence on renal dialysis: Secondary | ICD-10-CM | POA: Diagnosis not present

## 2022-08-19 DIAGNOSIS — N186 End stage renal disease: Secondary | ICD-10-CM | POA: Diagnosis not present

## 2022-08-20 DIAGNOSIS — Z992 Dependence on renal dialysis: Secondary | ICD-10-CM | POA: Diagnosis not present

## 2022-08-20 DIAGNOSIS — N186 End stage renal disease: Secondary | ICD-10-CM | POA: Diagnosis not present

## 2022-08-21 DIAGNOSIS — N186 End stage renal disease: Secondary | ICD-10-CM | POA: Diagnosis not present

## 2022-08-21 DIAGNOSIS — Z992 Dependence on renal dialysis: Secondary | ICD-10-CM | POA: Diagnosis not present

## 2022-08-22 DIAGNOSIS — Z992 Dependence on renal dialysis: Secondary | ICD-10-CM | POA: Diagnosis not present

## 2022-08-22 DIAGNOSIS — N186 End stage renal disease: Secondary | ICD-10-CM | POA: Diagnosis not present

## 2022-08-23 DIAGNOSIS — N186 End stage renal disease: Secondary | ICD-10-CM | POA: Diagnosis not present

## 2022-08-23 DIAGNOSIS — Z992 Dependence on renal dialysis: Secondary | ICD-10-CM | POA: Diagnosis not present

## 2022-08-24 DIAGNOSIS — N186 End stage renal disease: Secondary | ICD-10-CM | POA: Diagnosis not present

## 2022-08-24 DIAGNOSIS — Z992 Dependence on renal dialysis: Secondary | ICD-10-CM | POA: Diagnosis not present

## 2022-08-25 DIAGNOSIS — N186 End stage renal disease: Secondary | ICD-10-CM | POA: Diagnosis not present

## 2022-08-25 DIAGNOSIS — Z992 Dependence on renal dialysis: Secondary | ICD-10-CM | POA: Diagnosis not present

## 2022-08-26 DIAGNOSIS — D509 Iron deficiency anemia, unspecified: Secondary | ICD-10-CM | POA: Diagnosis not present

## 2022-08-26 DIAGNOSIS — N186 End stage renal disease: Secondary | ICD-10-CM | POA: Diagnosis not present

## 2022-08-26 DIAGNOSIS — Z992 Dependence on renal dialysis: Secondary | ICD-10-CM | POA: Diagnosis not present

## 2022-08-27 DIAGNOSIS — Z992 Dependence on renal dialysis: Secondary | ICD-10-CM | POA: Diagnosis not present

## 2022-08-27 DIAGNOSIS — N186 End stage renal disease: Secondary | ICD-10-CM | POA: Diagnosis not present

## 2022-08-28 DIAGNOSIS — N186 End stage renal disease: Secondary | ICD-10-CM | POA: Diagnosis not present

## 2022-08-28 DIAGNOSIS — Z992 Dependence on renal dialysis: Secondary | ICD-10-CM | POA: Diagnosis not present

## 2022-08-29 ENCOUNTER — Ambulatory Visit: Payer: Medicare Other | Admitting: Gastroenterology

## 2022-08-29 ENCOUNTER — Encounter: Payer: Self-pay | Admitting: Gastroenterology

## 2022-08-29 ENCOUNTER — Other Ambulatory Visit: Payer: Self-pay

## 2022-08-29 VITALS — BP 128/63 | HR 60 | Temp 97.4°F | Ht 74.0 in | Wt 195.8 lb

## 2022-08-29 DIAGNOSIS — R1319 Other dysphagia: Secondary | ICD-10-CM

## 2022-08-29 DIAGNOSIS — Z1211 Encounter for screening for malignant neoplasm of colon: Secondary | ICD-10-CM

## 2022-08-29 DIAGNOSIS — N186 End stage renal disease: Secondary | ICD-10-CM | POA: Diagnosis not present

## 2022-08-29 DIAGNOSIS — Z992 Dependence on renal dialysis: Secondary | ICD-10-CM | POA: Diagnosis not present

## 2022-08-29 DIAGNOSIS — R1310 Dysphagia, unspecified: Secondary | ICD-10-CM

## 2022-08-29 NOTE — H&P (View-Only) (Signed)
  Primary Care Physician: Abernathy, Alyssa, NP   Primary Gastroenterologist:  Dr. Maebell Lyvers  Chief Complaint  Patient presents with   Follow-up    GERD    HPI: [name] [name] is a 62 y.o. male here with a history of Barrett's esophagus with a previous biopsy showing low-grade dysplasia.  The patient now comes in to talk about setting up a another upper endoscopy for biopsies. He also reports dysphagia.  The patient reports that he is now on dialysis.  He has not had a colonoscopy since 2013.  The patient has lost weight to a point where he does not take his diabetic medication often.  There is no report of any abdominal pain fevers chills nausea or vomiting.  He does state that despite his dysphagia he can take a prep if needed for colonoscopy.  The patient also states that he used to have low blood pressure when he laid down but now he sleeps flat without any issues.  Past Medical History:  Diagnosis Date   Anemia    Arthritis    CAD (coronary artery disease)    a. 12/2016 s/p CABG x 4 (LIMA->LAD, VG->RCA, VG->OM1, VG->D1); b. 02/2018 MV: EF 35%, small-med inferolaterlal infarct. No ischemia.   CKD (chronic kidney disease), stage IV    Colon polyps    COPD (chronic obstructive pulmonary disease)    Dupre's syndrome    GERD (gastroesophageal reflux disease)    HFrEF (heart failure reduced ejection fraction) (HCC)    a.) 2018 EF 35%; b.) 02/2018 EF 50%; c). 02/2019 EF 40-45%; d.) 08/2019 Echo: EF 50-55%, no rwma, GrII DD; e.) 04/2020 Echo: EF 30-35%, mod-sev glob HK. Mild LVH. G2DD. Low-nl RV fxn. Mildly dil LA. Mild MR. Mild-mod Ao sclerosis w/o stenosis; f.) TTE 02/09/2021: EF 55%; septal HK, LAE; G2DD.   History of 2019 novel coronavirus disease (COVID-19) 02/08/2021   Hyperlipidemia    Hypertension    Ischemic cardiomyopathy    a.) 02/2018 MV: EF 35%; b.) 08/2019 Echo: EF 50-55%; c.) 04/2020 Echo: EF 30-35%; d.) TTE 02/09/2021: EF 50-55%.   Lymphedema    Migraine     Myocardial infarction    Neuropathy    PAD (peripheral artery disease)    a. 04/2017 Aortoiliac duplex: R iliac dzs; b. 03/2020 LE Duplex: mild bilat atherosclerosis throughout. Patent vessels.   Pneumonia    Retinopathy    S/P CABG x 4    a.)  4v CABG: LIMA->LAD, SVG->RCA, SVG->OM1, SVG->D1   Sleep apnea    a.) does not require nocturnal PAP therapy   Statin intolerance    Stomach ulcer    T2DM (type 2 diabetes mellitus)     Current Outpatient Medications  Medication Sig Dispense Refill   aspirin EC 81 MG tablet Take 81 mg by mouth daily. Swallow whole.     carvedilol (COREG) 12.5 MG tablet TAKE 1 1/2 TABLETS BY MOUTH TWICE DAILY 270 tablet 0   cholecalciferol (VITAMIN D3) 25 MCG (1000 UNIT) tablet Take 2,000 Units by mouth 2 (two) times daily.     docusate calcium (SURFAK) 240 MG capsule Take 240 mg by mouth daily.     ezetimibe (ZETIA) 10 MG tablet TAKE 1 TABLET(10 MG) BY MOUTH DAILY 90 tablet 2   Insulin Pen Needle (B-D ULTRAFINE III SHORT PEN) 31G X 8 MM MISC To use with pen basal and mealtime coverage insulin pens 5 times daily. DX: E11.65 500 each 1   isosorbide mononitrate (IMDUR) 30 MG 24   hr tablet Take 1 tablet (30 mg total) by mouth 2 (two) times daily. 180 tablet 3   losartan (COZAAR) 25 MG tablet Take 25 mg by mouth every evening.     nortriptyline (PAMELOR) 10 MG capsule Take 50 mg by mouth at bedtime.     NOVOLOG FLEXPEN 100 UNIT/ML FlexPen Inject into the skin 3 (three) times daily with meals. 3 x 's daily with meals - SSI     pantoprazole (PROTONIX) 40 MG tablet Take 1 tablet (40 mg total) by mouth 2 (two) times daily before a meal. 180 tablet 3   REPATHA SURECLICK 140 MG/ML SOAJ INJECT 140 MG UNDER THE SKIN EVERY 14 DAYS 2 mL 10   Ruxolitinib Phosphate (OPZELURA) 1.5 % CREA Apply to skin daily, legs (Patient taking differently: Apply 1 Application topically daily. Apply to skin daily, legs) 60 g 2   torsemide (DEMADEX) 20 MG tablet Take 20 mg twice a day 180 tablet 3    TRESIBA FLEXTOUCH 200 UNIT/ML FlexTouch Pen 100 Units at bedtime.     No current facility-administered medications for this visit.    Allergies as of 08/29/2022 - Review Complete 08/29/2022  Allergen Reaction Noted   Iodinated contrast media Other (See Comments) 06/01/2020   Other Other (See Comments) 05/19/2019   Statins Other (See Comments) 05/19/2019   Pregabalin Other (See Comments) 05/31/2019   Sacubitril-valsartan Other (See Comments) 12/02/2019   Atorvastatin Other (See Comments) 05/31/2019   Pravastatin Other (See Comments) 05/31/2019   Rosuvastatin Other (See Comments) 12/09/2012    ROS:  General: Negative for anorexia, weight loss, fever, chills, fatigue, weakness. ENT: Negative for hoarseness, difficulty swallowing , nasal congestion. CV: Negative for chest pain, angina, palpitations, dyspnea on exertion, peripheral edema.  Respiratory: Negative for dyspnea at rest, dyspnea on exertion, cough, sputum, wheezing.  GI: See history of present illness. GU:  Negative for dysuria, hematuria, urinary incontinence, urinary frequency, nocturnal urination.  Endo: Negative for unusual weight change.    Physical Examination:   BP 128/63   Pulse 60   Temp (!) 97.4 F (36.3 C) (Oral)   Ht 6' 2" (1.88 m)   Wt 195 lb 12.8 oz (88.8 kg)   BMI 25.14 kg/m   General: Well-nourished, well-developed in no acute distress.  Eyes: No icterus. Conjunctivae pink. Neuro: Alert and oriented x 3.  Grossly intact. Skin: Warm and dry, no jaundice.   Psych: Alert and cooperative, normal mood and affect.  Labs:    Imaging Studies: No results found.  Assessment and Plan:   [name] [name] is a 62 y.o. y/o male with a history of Barrett's esophagus with low-grade dysplasia at that was followed with clean biopsies.  The patient now comes in with dysphagia and in need of a screening colonoscopy.  The patient will be set up for an EGD and colonoscopy.  The patient has been explained the plan  agrees with it.     Kasie Leccese, MD. FACG    Note: This dictation was prepared with Dragon dictation along with smaller phrase technology. Any transcriptional errors that result from this process are unintentional.  

## 2022-08-29 NOTE — Progress Notes (Signed)
Primary Care Physician: Jonetta Osgood, NP   Primary Gastroenterologist:  Dr. Lucilla Lame  Chief Complaint  Patient presents with   Follow-up    GERD    HPI: [name] [name] is a 62 y.o. male here with a history of Barrett's esophagus with a previous biopsy showing low-grade dysplasia.  The patient now comes in to talk about setting up a another upper endoscopy for biopsies. He also reports dysphagia.  The patient reports that he is now on dialysis.  He has not had a colonoscopy since 2013.  The patient has lost weight to a point where he does not take his diabetic medication often.  There is no report of any abdominal pain fevers chills nausea or vomiting.  He does state that despite his dysphagia he can take a prep if needed for colonoscopy.  The patient also states that he used to have low blood pressure when he laid down but now he sleeps flat without any issues.  Past Medical History:  Diagnosis Date   Anemia    Arthritis    CAD (coronary artery disease)    a. 12/2016 s/p CABG x 4 (LIMA->LAD, VG->RCA, VG->OM1, VG->D1); b. 02/2018 MV: EF 35%, small-med inferolaterlal infarct. No ischemia.   CKD (chronic kidney disease), stage IV    Colon polyps    COPD (chronic obstructive pulmonary disease)    Dupre's syndrome    GERD (gastroesophageal reflux disease)    HFrEF (heart failure reduced ejection fraction) ([place])    a.) 2018 EF 35%; b.) 02/2018 EF 50%; c). 02/2019 EF 40-45%; d.) 08/2019 Echo: EF 50-55%, no rwma, GrII DD; e.) 04/2020 Echo: EF 30-35%, mod-sev glob HK. Mild LVH. G2DD. Low-nl RV fxn. Mildly dil LA. Mild MR. Mild-mod Ao sclerosis w/o stenosis; f.) TTE 02/09/2021: EF 55%; septal HK, LAE; G2DD.   History of 2019 novel coronavirus disease (COVID-19) 02/08/2021   Hyperlipidemia    Hypertension    Ischemic cardiomyopathy    a.) 02/2018 MV: EF 35%; b.) 08/2019 Echo: EF 50-55%; c.) 04/2020 Echo: EF 30-35%; d.) TTE 02/09/2021: EF 50-55%.   Lymphedema    Migraine     Myocardial infarction    Neuropathy    PAD (peripheral artery disease)    a. 04/2017 Aortoiliac duplex: R iliac dzs; b. 03/2020 LE Duplex: mild bilat atherosclerosis throughout. Patent vessels.   Pneumonia    Retinopathy    S/P CABG x 4    a.)  4v CABG: LIMA->LAD, SVG->RCA, SVG->OM1, SVG->D1   Sleep apnea    a.) does not require nocturnal PAP therapy   Statin intolerance    Stomach ulcer    T2DM (type 2 diabetes mellitus)     Current Outpatient Medications  Medication Sig Dispense Refill   aspirin EC 81 MG tablet Take 81 mg by mouth daily. Swallow whole.     carvedilol (COREG) 12.5 MG tablet TAKE 1 1/2 TABLETS BY MOUTH TWICE DAILY 270 tablet 0   cholecalciferol (VITAMIN D3) 25 MCG (1000 UNIT) tablet Take 2,000 Units by mouth 2 (two) times daily.     docusate calcium (SURFAK) 240 MG capsule Take 240 mg by mouth daily.     ezetimibe (ZETIA) 10 MG tablet TAKE 1 TABLET(10 MG) BY MOUTH DAILY 90 tablet 2   Insulin Pen Needle (B-D ULTRAFINE III SHORT PEN) 31G X 8 MM MISC To use with pen basal and mealtime coverage insulin pens 5 times daily. DX: E11.65 500 each 1   isosorbide mononitrate (IMDUR) 30 MG 24  hr tablet Take 1 tablet (30 mg total) by mouth 2 (two) times daily. 180 tablet 3   losartan (COZAAR) 25 MG tablet Take 25 mg by mouth every evening.     nortriptyline (PAMELOR) 10 MG capsule Take 50 mg by mouth at bedtime.     NOVOLOG FLEXPEN 100 UNIT/ML FlexPen Inject into the skin 3 (three) times daily with meals. 3 x 's daily with meals - SSI     pantoprazole (PROTONIX) 40 MG tablet Take 1 tablet (40 mg total) by mouth 2 (two) times daily before a meal. 180 tablet 3   REPATHA SURECLICK [id number] MG/ML SOAJ INJECT 140 MG UNDER THE SKIN EVERY 14 DAYS 2 mL 10   Ruxolitinib Phosphate (OPZELURA) 1.5 % CREA Apply to skin daily, legs (Patient taking differently: Apply 1 Application topically daily. Apply to skin daily, legs) 60 g 2   torsemide (DEMADEX) 20 MG tablet Take 20 mg twice a day 180 tablet 3    TRESIBA FLEXTOUCH 200 UNIT/ML FlexTouch Pen 100 Units at bedtime.     No current facility-administered medications for this visit.    Allergies as of 08/29/2022 - Review Complete 08/29/2022  Allergen Reaction Noted   Iodinated contrast media Other (See Comments) 06/01/2020   Other Other (See Comments) 05/19/2019   Statins Other (See Comments) 05/19/2019   Pregabalin Other (See Comments) 05/31/2019   Sacubitril-valsartan Other (See Comments) 12/02/2019   Atorvastatin Other (See Comments) 05/31/2019   Pravastatin Other (See Comments) 05/31/2019   Rosuvastatin Other (See Comments) 12/09/2012    ROS:  General: Negative for anorexia, weight loss, fever, chills, fatigue, weakness. ENT: Negative for hoarseness, difficulty swallowing , nasal congestion. CV: Negative for chest pain, angina, palpitations, dyspnea on exertion, peripheral edema.  Respiratory: Negative for dyspnea at rest, dyspnea on exertion, cough, sputum, wheezing.  GI: See history of present illness. GU:  Negative for dysuria, hematuria, urinary incontinence, urinary frequency, nocturnal urination.  Endo: Negative for unusual weight change.    Physical Examination:   BP 128/63   Pulse 60   Temp (!) 97.4 F (36.3 C) (Oral)   Ht 6\' 2"  (1.88 m)   Wt 195 lb 12.8 oz (88.8 kg)   BMI 25.14 kg/m   General: Well-nourished, well-developed in no acute distress.  Eyes: No icterus. Conjunctivae pink. Neuro: Alert and oriented x 3.  Grossly intact. Skin: Warm and dry, no jaundice.   Psych: Alert and cooperative, normal mood and affect.  Labs:    Imaging Studies: No results found.  Assessment and Plan:   [name] [name] is a 62 y.o. y/o male with a history of Barrett's esophagus with low-grade dysplasia at that was followed with clean biopsies.  The patient now comes in with dysphagia and in need of a screening colonoscopy.  The patient will be set up for an EGD and colonoscopy.  The patient has been explained the plan  agrees with it.     Lucilla Lame, MD. [place]val Regal    Note: This dictation was prepared with Dragon dictation along with smaller phrase technology. Any transcriptional errors that result from this process are unintentional.

## 2022-08-30 DIAGNOSIS — Z992 Dependence on renal dialysis: Secondary | ICD-10-CM | POA: Diagnosis not present

## 2022-08-30 DIAGNOSIS — N186 End stage renal disease: Secondary | ICD-10-CM | POA: Diagnosis not present

## 2022-08-31 DIAGNOSIS — N186 End stage renal disease: Secondary | ICD-10-CM | POA: Diagnosis not present

## 2022-08-31 DIAGNOSIS — Z992 Dependence on renal dialysis: Secondary | ICD-10-CM | POA: Diagnosis not present

## 2022-09-01 DIAGNOSIS — Z992 Dependence on renal dialysis: Secondary | ICD-10-CM | POA: Diagnosis not present

## 2022-09-01 DIAGNOSIS — N186 End stage renal disease: Secondary | ICD-10-CM | POA: Diagnosis not present

## 2022-09-02 DIAGNOSIS — N186 End stage renal disease: Secondary | ICD-10-CM | POA: Diagnosis not present

## 2022-09-02 DIAGNOSIS — Z992 Dependence on renal dialysis: Secondary | ICD-10-CM | POA: Diagnosis not present

## 2022-09-02 DIAGNOSIS — E109 Type 1 diabetes mellitus without complications: Secondary | ICD-10-CM | POA: Diagnosis not present

## 2022-09-02 DIAGNOSIS — E118 Type 2 diabetes mellitus with unspecified complications: Secondary | ICD-10-CM | POA: Diagnosis not present

## 2022-09-02 DIAGNOSIS — D649 Anemia, unspecified: Secondary | ICD-10-CM | POA: Diagnosis not present

## 2022-09-02 DIAGNOSIS — D509 Iron deficiency anemia, unspecified: Secondary | ICD-10-CM | POA: Diagnosis not present

## 2022-09-02 DIAGNOSIS — N250 Renal osteodystrophy: Secondary | ICD-10-CM | POA: Diagnosis not present

## 2022-09-02 DIAGNOSIS — Z794 Long term (current) use of insulin: Secondary | ICD-10-CM | POA: Diagnosis not present

## 2022-09-03 DIAGNOSIS — N186 End stage renal disease: Secondary | ICD-10-CM | POA: Diagnosis not present

## 2022-09-03 DIAGNOSIS — Z992 Dependence on renal dialysis: Secondary | ICD-10-CM | POA: Diagnosis not present

## 2022-09-04 DIAGNOSIS — Z992 Dependence on renal dialysis: Secondary | ICD-10-CM | POA: Diagnosis not present

## 2022-09-04 DIAGNOSIS — N186 End stage renal disease: Secondary | ICD-10-CM | POA: Diagnosis not present

## 2022-09-05 DIAGNOSIS — Z992 Dependence on renal dialysis: Secondary | ICD-10-CM | POA: Diagnosis not present

## 2022-09-05 DIAGNOSIS — N186 End stage renal disease: Secondary | ICD-10-CM | POA: Diagnosis not present

## 2022-09-06 DIAGNOSIS — Z992 Dependence on renal dialysis: Secondary | ICD-10-CM | POA: Diagnosis not present

## 2022-09-06 DIAGNOSIS — N186 End stage renal disease: Secondary | ICD-10-CM | POA: Diagnosis not present

## 2022-09-07 DIAGNOSIS — N186 End stage renal disease: Secondary | ICD-10-CM | POA: Diagnosis not present

## 2022-09-07 DIAGNOSIS — Z992 Dependence on renal dialysis: Secondary | ICD-10-CM | POA: Diagnosis not present

## 2022-09-08 DIAGNOSIS — Z992 Dependence on renal dialysis: Secondary | ICD-10-CM | POA: Diagnosis not present

## 2022-09-08 DIAGNOSIS — N186 End stage renal disease: Secondary | ICD-10-CM | POA: Diagnosis not present

## 2022-09-09 DIAGNOSIS — N186 End stage renal disease: Secondary | ICD-10-CM | POA: Diagnosis not present

## 2022-09-09 DIAGNOSIS — Z992 Dependence on renal dialysis: Secondary | ICD-10-CM | POA: Diagnosis not present

## 2022-09-10 DIAGNOSIS — Z992 Dependence on renal dialysis: Secondary | ICD-10-CM | POA: Diagnosis not present

## 2022-09-10 DIAGNOSIS — N186 End stage renal disease: Secondary | ICD-10-CM | POA: Diagnosis not present

## 2022-09-11 DIAGNOSIS — Z992 Dependence on renal dialysis: Secondary | ICD-10-CM | POA: Diagnosis not present

## 2022-09-11 DIAGNOSIS — N186 End stage renal disease: Secondary | ICD-10-CM | POA: Diagnosis not present

## 2022-09-12 DIAGNOSIS — N186 End stage renal disease: Secondary | ICD-10-CM | POA: Diagnosis not present

## 2022-09-12 DIAGNOSIS — Z992 Dependence on renal dialysis: Secondary | ICD-10-CM | POA: Diagnosis not present

## 2022-09-13 DIAGNOSIS — N186 End stage renal disease: Secondary | ICD-10-CM | POA: Diagnosis not present

## 2022-09-13 DIAGNOSIS — Z992 Dependence on renal dialysis: Secondary | ICD-10-CM | POA: Diagnosis not present

## 2022-09-14 DIAGNOSIS — N186 End stage renal disease: Secondary | ICD-10-CM | POA: Diagnosis not present

## 2022-09-14 DIAGNOSIS — Z992 Dependence on renal dialysis: Secondary | ICD-10-CM | POA: Diagnosis not present

## 2022-09-15 DIAGNOSIS — Z992 Dependence on renal dialysis: Secondary | ICD-10-CM | POA: Diagnosis not present

## 2022-09-15 DIAGNOSIS — N186 End stage renal disease: Secondary | ICD-10-CM | POA: Diagnosis not present

## 2022-09-16 DIAGNOSIS — Z992 Dependence on renal dialysis: Secondary | ICD-10-CM | POA: Diagnosis not present

## 2022-09-16 DIAGNOSIS — N186 End stage renal disease: Secondary | ICD-10-CM | POA: Diagnosis not present

## 2022-09-17 DIAGNOSIS — Z992 Dependence on renal dialysis: Secondary | ICD-10-CM | POA: Diagnosis not present

## 2022-09-17 DIAGNOSIS — N186 End stage renal disease: Secondary | ICD-10-CM | POA: Diagnosis not present

## 2022-09-17 DIAGNOSIS — E133513 Other specified diabetes mellitus with proliferative diabetic retinopathy with macular edema, bilateral: Secondary | ICD-10-CM | POA: Diagnosis not present

## 2022-09-18 DIAGNOSIS — N186 End stage renal disease: Secondary | ICD-10-CM | POA: Diagnosis not present

## 2022-09-18 DIAGNOSIS — Z992 Dependence on renal dialysis: Secondary | ICD-10-CM | POA: Diagnosis not present

## 2022-09-19 ENCOUNTER — Telehealth: Payer: Self-pay

## 2022-09-19 NOTE — Telephone Encounter (Signed)
Patient wife left a voicemail and states the saw the kidney doctor yesterday and they okay him to have the prep. She went to the pharmacy today to get the prep and the pharmacy does not have anything. She states this is ery frustrating because she has to be with him at dialysis  and does not know when she will be able to go back and get the prep. She would like a call back.

## 2022-09-20 DIAGNOSIS — N186 End stage renal disease: Secondary | ICD-10-CM | POA: Diagnosis not present

## 2022-09-20 DIAGNOSIS — Z992 Dependence on renal dialysis: Secondary | ICD-10-CM | POA: Diagnosis not present

## 2022-09-20 MED ORDER — PEG 3350-KCL-NA BICARB-NACL 420 G PO SOLR: ORAL | 0 refills | Status: DC

## 2022-09-20 NOTE — Telephone Encounter (Signed)
Rx sent through e-scribe Msg sent via Mychart   Called spouse and lmovm

## 2022-09-21 DIAGNOSIS — N186 End stage renal disease: Secondary | ICD-10-CM | POA: Diagnosis not present

## 2022-09-21 DIAGNOSIS — Z992 Dependence on renal dialysis: Secondary | ICD-10-CM | POA: Diagnosis not present

## 2022-09-22 DIAGNOSIS — Z992 Dependence on renal dialysis: Secondary | ICD-10-CM | POA: Diagnosis not present

## 2022-09-22 DIAGNOSIS — N186 End stage renal disease: Secondary | ICD-10-CM | POA: Diagnosis not present

## 2022-09-23 ENCOUNTER — Encounter: Payer: Self-pay | Admitting: Gastroenterology

## 2022-09-24 ENCOUNTER — Ambulatory Visit
Admission: RE | Admit: 2022-09-24 | Discharge: 2022-09-24 | Disposition: A | Discharge status: Home / Self Care | Payer: Medicare Other | Source: Ambulatory Visit | Attending: Gastroenterology | Admitting: Gastroenterology

## 2022-09-24 ENCOUNTER — Other Ambulatory Visit: Payer: Self-pay

## 2022-09-24 ENCOUNTER — Telehealth: Payer: Self-pay

## 2022-09-24 ENCOUNTER — Encounter: Payer: Self-pay | Admitting: Gastroenterology

## 2022-09-24 ENCOUNTER — Ambulatory Visit: Payer: Medicare Other | Admitting: Certified Registered"

## 2022-09-24 ENCOUNTER — Encounter
Admission: RE | Disposition: A | Discharge status: Home / Self Care | Payer: Self-pay | Source: Ambulatory Visit | Attending: Gastroenterology

## 2022-09-24 DIAGNOSIS — I252 Old myocardial infarction: Secondary | ICD-10-CM | POA: Insufficient documentation

## 2022-09-24 DIAGNOSIS — I132 Hypertensive heart and chronic kidney disease with heart failure and with stage 5 chronic kidney disease, or end stage renal disease: Secondary | ICD-10-CM | POA: Insufficient documentation

## 2022-09-24 DIAGNOSIS — I2510 Atherosclerotic heart disease of native coronary artery without angina pectoris: Secondary | ICD-10-CM | POA: Diagnosis not present

## 2022-09-24 DIAGNOSIS — E1122 Type 2 diabetes mellitus with diabetic chronic kidney disease: Secondary | ICD-10-CM | POA: Diagnosis not present

## 2022-09-24 DIAGNOSIS — I5020 Unspecified systolic (congestive) heart failure: Secondary | ICD-10-CM | POA: Insufficient documentation

## 2022-09-24 DIAGNOSIS — G4730 Sleep apnea, unspecified: Secondary | ICD-10-CM | POA: Insufficient documentation

## 2022-09-24 DIAGNOSIS — Z8711 Personal history of peptic ulcer disease: Secondary | ICD-10-CM | POA: Diagnosis not present

## 2022-09-24 DIAGNOSIS — R1310 Dysphagia, unspecified: Secondary | ICD-10-CM | POA: Diagnosis not present

## 2022-09-24 DIAGNOSIS — Z794 Long term (current) use of insulin: Secondary | ICD-10-CM | POA: Diagnosis not present

## 2022-09-24 DIAGNOSIS — Z87891 Personal history of nicotine dependence: Secondary | ICD-10-CM | POA: Diagnosis not present

## 2022-09-24 DIAGNOSIS — J449 Chronic obstructive pulmonary disease, unspecified: Secondary | ICD-10-CM | POA: Insufficient documentation

## 2022-09-24 DIAGNOSIS — Z992 Dependence on renal dialysis: Secondary | ICD-10-CM | POA: Diagnosis not present

## 2022-09-24 DIAGNOSIS — I110 Hypertensive heart disease with heart failure: Secondary | ICD-10-CM | POA: Diagnosis not present

## 2022-09-24 DIAGNOSIS — K219 Gastro-esophageal reflux disease without esophagitis: Secondary | ICD-10-CM | POA: Diagnosis not present

## 2022-09-24 DIAGNOSIS — K22719 Barrett's esophagus with dysplasia, unspecified: Secondary | ICD-10-CM

## 2022-09-24 DIAGNOSIS — N186 End stage renal disease: Secondary | ICD-10-CM | POA: Diagnosis not present

## 2022-09-24 DIAGNOSIS — Z1211 Encounter for screening for malignant neoplasm of colon: Secondary | ICD-10-CM

## 2022-09-24 DIAGNOSIS — I5022 Chronic systolic (congestive) heart failure: Secondary | ICD-10-CM | POA: Diagnosis not present

## 2022-09-24 DIAGNOSIS — K2270 Barrett's esophagus without dysplasia: Secondary | ICD-10-CM | POA: Diagnosis not present

## 2022-09-24 DIAGNOSIS — R1319 Other dysphagia: Secondary | ICD-10-CM

## 2022-09-24 HISTORY — PX: ESOPHAGOGASTRODUODENOSCOPY (EGD) WITH PROPOFOL: SHX5813

## 2022-09-24 LAB — GLUCOSE, CAPILLARY
Glucose-Capillary: 73 mg/dL (ref 70–99)
Glucose-Capillary: 102 mg/dL — ABNORMAL HIGH (ref 70–99)

## 2022-09-24 SURGERY — Surgical Case
Anesthesia: General

## 2022-09-24 MED ORDER — SODIUM CHLORIDE 0.9 % IV SOLN: INTRAVENOUS | Status: DC

## 2022-09-24 MED ORDER — DEXTROSE 50 % IV SOLN
INTRAVENOUS | Status: AC
  Filled 2022-09-24: qty 50

## 2022-09-24 MED ORDER — DEXMEDETOMIDINE HCL IN NACL 80 MCG/20ML IV SOLN
INTRAVENOUS | Status: AC
  Filled 2022-09-24: qty 20

## 2022-09-24 MED ORDER — DEXMEDETOMIDINE HCL IN NACL 80 MCG/20ML IV SOLN
INTRAVENOUS | Status: DC | PRN
  Administered 2022-09-24: 8 ug via INTRAVENOUS

## 2022-09-24 MED ORDER — PROPOFOL 10 MG/ML IV BOLUS
INTRAVENOUS | Status: AC
  Filled 2022-09-24: qty 40

## 2022-09-24 MED ORDER — PROPOFOL 10 MG/ML IV BOLUS
INTRAVENOUS | Status: DC | PRN
  Administered 2022-09-24: 20 mg via INTRAVENOUS
  Administered 2022-09-24: 100 mg via INTRAVENOUS
  Administered 2022-09-24: 20 mg via INTRAVENOUS

## 2022-09-24 MED ORDER — LIDOCAINE HCL (PF) 2 % IJ SOLN
INTRAMUSCULAR | Status: AC
  Filled 2022-09-24: qty 5

## 2022-09-24 MED ORDER — DEXTROSE 50 % IV SOLN
25.0000 mL | Freq: Once | INTRAVENOUS | Status: AC
  Administered 2022-09-24: 25 mL via INTRAVENOUS

## 2022-09-24 MED ORDER — LIDOCAINE HCL (CARDIAC) PF 100 MG/5ML IV SOSY
PREFILLED_SYRINGE | INTRAVENOUS | Status: DC | PRN
  Administered 2022-09-24: 100 mg via INTRAVENOUS

## 2022-09-24 NOTE — Anesthesia Postprocedure Evaluation (Signed)
Anesthesia Post Note  Patient: [name] [name]  Procedure(s) Performed: ESOPHAGOGASTRODUODENOSCOPY (EGD) WITH PROPOFOL  Patient location during evaluation: PACU Anesthesia Type: General Level of consciousness: awake and alert Pain management: pain level controlled Vital Signs Assessment: post-procedure vital signs reviewed and stable Respiratory status: spontaneous breathing, nonlabored ventilation, respiratory function stable and patient connected to nasal cannula oxygen Cardiovascular status: blood pressure returned to baseline and stable Postop Assessment: no apparent nausea or vomiting Anesthetic complications: no   No notable events documented.   Last Vitals:  Vitals:   09/24/22 0802 09/24/22 0812  BP: (!) 124/58 (!) 112/55  Pulse: 60   Resp: 15   Temp:    SpO2: 100%     Last Pain:  Vitals:   09/24/22 0812  TempSrc:   PainSc: 0-No pain                 Corinda Gubler

## 2022-09-24 NOTE — Op Note (Signed)
[facility] Gastroenterology Patient Name: [name] [name] Procedure Date: 09/24/2022 7:24 AM MRN: [id number] Account #: [id number] Date of Birth: 1961/01/02 Admit Type: Outpatient Age: 62 Room: [facility] ENDO ROOM 4 Gender: Male Note Status: Finalized Instrument Name: Upper Endoscope [id number] Procedure:             Upper GI endoscopy Indications:           Barrett's esophagus, Follow-up of Barrett's esophagus,                         Surveillance for malignancy due to personal history of                         Barrett's esophagus Providers:             [name] MD, MD Medicines:             Propofol per Anesthesia Complications:         No immediate complications. Procedure:             Pre-Anesthesia Assessment:                        - Prior to the procedure, a History and Physical was                         performed, and patient medications and allergies were                         reviewed. The patient's tolerance of previous                         anesthesia was also reviewed. The risks and benefits                         of the procedure and the sedation options and risks                         were discussed with the patient. All questions were                         answered, and informed consent was obtained. Prior                         Anticoagulants: The patient has taken no anticoagulant                         or antiplatelet agents. ASA Grade Assessment: II - A                         patient with mild systemic disease. After reviewing                         the risks and benefits, the patient was deemed in                         satisfactory condition to undergo the procedure.                        After obtaining informed  consent, the endoscope was                         passed under direct vision. Throughout the procedure,                         the patient's blood pressure, pulse, and oxygen                         saturations were  monitored continuously. The Endoscope                         was introduced through the mouth, and advanced to the                         second part of duodenum. The upper GI endoscopy was                         accomplished without difficulty. The patient tolerated                         the procedure well. Findings:      There were esophageal mucosal changes secondary to established       long-segment Barrett's disease present in the lower third of the       esophagus. The maximum longitudinal extent of these mucosal changes was       3 cm in length. This was biopsied with a cold forceps for histology.      A small amount of food (residue) was found in the entire examined       stomach.      The examined duodenum was normal. Impression:            - Esophageal mucosal changes secondary to established                         long-segment Barrett's disease. Biopsied.                        - A small amount of food (residue) in the stomach.                        - Normal examined duodenum. Recommendation:        - Discharge patient to home.                        - Resume previous diet.                        - Continue present medications.                        - Await pathology results. Further recomendations                         based on the pathology Procedure Code(s):     --- Professional ---                        [phone], Esophagogastroduodenoscopy, flexible,  transoral; with biopsy, single or multiple Diagnosis Code(s):     --- Professional ---                        K22.70, Barrett's esophagus without dysplasia CPT copyright 2022 American Medical Association. All rights reserved. The codes documented in this report are preliminary and upon coder review may  be revised to meet current compliance requirements. [name] MD, MD 09/24/2022 8:08:52 AM This report has been signed electronically. Number of Addenda: 0 Note Initiated On: 09/24/2022 7:24  AM Estimated Blood Loss:  Estimated blood loss: none.      [facility]

## 2022-09-24 NOTE — Telephone Encounter (Signed)
poor prep. needs a 2 day prep

## 2022-09-24 NOTE — Anesthesia Preprocedure Evaluation (Addendum)
Anesthesia Evaluation  Patient identified by MRN, date of birth, ID band Patient awake    Reviewed: Allergy & Precautions, NPO status , Patient's Chart, lab work & pertinent test results  History of Anesthesia Complications Negative for: history of anesthetic complications  Airway Mallampati: II  TM Distance: >3 FB Neck ROM: Full    Dental  (+) Chipped, Missing   Pulmonary sleep apnea , COPD, Patient abstained from smoking.Not current smoker, former smoker   Pulmonary exam normal breath sounds clear to auscultation       Cardiovascular Exercise Tolerance: Good METShypertension, + CAD, + Past MI, + Peripheral Vascular Disease and +CHF  (-) dysrhythmias  Rhythm:Regular Rate:Normal - Systolic murmurs TTE 2022: 1. Left ventricular ejection fraction, by estimation, is 50 to 55%. The  left ventricle has low normal function. The left ventricle demonstrates  regional wall motion abnormalities (Septal wall hypokinesis consistent  with postoperative state.). Left  ventricular diastolic parameters are consistent with Grade II diastolic  dysfunction (pseudonormalization).   2. Right ventricular systolic function is normal. The right ventricular  size is normal. Tricuspid regurgitation signal is inadequate for assessing  PA pressure.   3. Left atrial size was mildly dilated.     Neuro/Psych  Headaches PSYCHIATRIC DISORDERS Anxiety Depression       GI/Hepatic PUD,GERD  Controlled,,(+)     (-) substance abuse    Endo/Other  diabetes    Renal/GU ESRF and DialysisRenal diseaseOn peritoneal dialysis, last did it yesterday     Musculoskeletal   Abdominal   Peds  Hematology   Anesthesia Other Findings Past Medical History: No date: Anemia No date: Arthritis No date: CAD (coronary artery disease)     Comment:  a. 12/2016 s/p CABG x 4 (LIMA->LAD, VG->RCA, VG->OM1,               VG->D1); b. 02/2018 MV: EF 35%, small-med  inferolaterlal               infarct. No ischemia. 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 02/2019 EF               40-45%; d.) 08/2019 Echo: EF 50-55%, no rwma, GrII DD; e.)              04/2020 Echo: EF 30-35%, mod-sev glob HK. Mild LVH. G2DD.              Low-nl RV fxn. Mildly dil LA. Mild MR. Mild-mod Ao               sclerosis w/o stenosis; f.) TTE 02/09/2021: EF 55%;               septal HK, LAE; G2DD. 02/08/2021: History of 2019 novel coronavirus disease (COVID-19) No date: Hyperlipidemia No date: Hypertension No date: Ischemic cardiomyopathy     Comment:  a.) 02/2018 MV: EF 35%; b.) 08/2019 Echo: EF 50-55%; c.)               04/2020 Echo: EF 30-35%; d.) TTE 02/09/2021: EF 50-55%. No date: Lymphedema No date: Migraine No date: Myocardial infarction (HCC) No date: Neuropathy No date: PAD (peripheral artery disease) (HCC)     Comment:  a. 04/2017 Aortoiliac duplex: R  iliac dzs; b. 03/2020 LE              Duplex: mild bilat atherosclerosis throughout. Patent               vessels. No date: Pneumonia No date: Retinopathy No date: S/P CABG x 4     Comment:  a.)  4v CABG: LIMA->LAD, SVG->RCA, SVG->OM1, SVG->D1 No date: Sleep apnea     Comment:  a.) does not require nocturnal PAP therapy No date: Statin intolerance No date: Stomach ulcer No date: T2DM (type 2 diabetes mellitus) (HCC)  Reproductive/Obstetrics                             Anesthesia Physical Anesthesia Plan  ASA: 4  Anesthesia Plan: General   Post-op Pain Management: Minimal or no pain anticipated   Induction: Intravenous  PONV Risk Score and Plan: 2 and Propofol infusion, TIVA and  Ondansetron  Airway Management Planned: Nasal Cannula  Additional Equipment: None  Intra-op Plan:   Post-operative Plan:   Informed Consent: I have reviewed the patients History and Physical, chart, labs and discussed the procedure including the risks, benefits and alternatives for the proposed anesthesia with the patient or authorized representative who has indicated his/her understanding and acceptance.     Dental advisory given  Plan Discussed with: CRNA and Surgeon  Anesthesia Plan Comments: (Discussed risks of anesthesia with patient, including possibility of difficulty with spontaneous ventilation under anesthesia necessitating airway intervention, PONV, and rare risks such as cardiac or respiratory or neurological events, and allergic reactions. Discussed the role of CRNA in patient's perioperative care. Patient understands.)       Anesthesia Quick Evaluation

## 2022-09-24 NOTE — Transfer of Care (Signed)
Immediate Anesthesia Transfer of Care Note  Patient: [name] [name]  Procedure(s) Performed: ESOPHAGOGASTRODUODENOSCOPY (EGD) WITH PROPOFOL  Patient Location: Endoscopy Unit  Anesthesia Type:General  Level of Consciousness: drowsy  Airway & Oxygen Therapy: Patient Spontanous Breathing  Post-op Assessment: Report given to RN and Post -op Vital signs reviewed and stable  Post vital signs: Reviewed and stable  Last Vitals:  Vitals Value Taken Time  BP 124/58 09/24/22 0803  Temp 35.8 0803  Pulse 59 09/24/22 0803  Resp 14 09/24/22 0803  SpO2 98 % 09/24/22 0803  Vitals shown include unvalidated device data.  Last Pain:  Vitals:   09/24/22 0709  TempSrc: Temporal  PainSc: 0-No pain         Complications: No notable events documented.

## 2022-09-24 NOTE — Interval H&P Note (Signed)
[name], MD [facility] [address], [address] [place], [place] [address] Phone:[phone] Fax : [phone]  Primary Care Physician:  Lyndon Code, MD Primary Gastroenterologist:  Dr. Servando Snare  Pre-Procedure History & Physical: HPI:  [name] [name] is a 62 y.o. male is here for an endoscopy.   Past Medical History:  Diagnosis Date   Anemia    Arthritis    CAD (coronary artery disease)    a. 12/2016 s/p CABG x 4 (LIMA->LAD, VG->RCA, VG->OM1, VG->D1); b. 02/2018 MV: EF 35%, small-med inferolaterlal infarct. No ischemia.   CKD (chronic kidney disease), stage IV (HCC)    Colon polyps    COPD (chronic obstructive pulmonary disease) (HCC)    Dupre's syndrome    GERD (gastroesophageal reflux disease)    HFrEF (heart failure reduced ejection fraction) (HCC)    a.) 2018 EF 35%; b.) 02/2018 EF 50%; c). 02/2019 EF 40-45%; d.) 08/2019 Echo: EF 50-55%, no rwma, GrII DD; e.) 04/2020 Echo: EF 30-35%, mod-sev glob HK. Mild LVH. G2DD. Low-nl RV fxn. Mildly dil LA. Mild MR. Mild-mod Ao sclerosis w/o stenosis; f.) TTE 02/09/2021: EF 55%; septal HK, LAE; G2DD.   History of 2019 novel coronavirus disease (COVID-19) 02/08/2021   Hyperlipidemia    Hypertension    Ischemic cardiomyopathy    a.) 02/2018 MV: EF 35%; b.) 08/2019 Echo: EF 50-55%; c.) 04/2020 Echo: EF 30-35%; d.) TTE 02/09/2021: EF 50-55%.   Lymphedema    Migraine    Myocardial infarction [facility])    Neuropathy    PAD (peripheral artery disease) (HCC)    a. 04/2017 Aortoiliac duplex: R iliac dzs; b. 03/2020 LE Duplex: mild bilat atherosclerosis throughout. Patent vessels.   Pneumonia    Retinopathy    S/P CABG x 4    a.)  4v CABG: LIMA->LAD, SVG->RCA, SVG->OM1, SVG->D1   Sleep apnea    a.) does not require nocturnal PAP therapy   Statin intolerance    Stomach ulcer    T2DM (type 2 diabetes mellitus) (HCC)     Past Surgical History:  Procedure Laterality Date   bone graft surgery Bilateral    on both feet x 2   CAPD INSERTION N/A  11/13/2021   Procedure: LAPAROSCOPIC INSERTION CONTINUOUS AMBULATORY PERITONEAL DIALYSIS  (CAPD) CATHETER;  Surgeon: Leafy Ro, MD;  Location: [facility] ORS;  Service: General;  Laterality: N/A;   CAPD REVISION N/A 01/10/2022   Procedure: LAPAROSCOPIC REVISION CONTINUOUS AMBULATORY PERITONEAL DIALYSIS  (CAPD) CATHETER;  Surgeon: Leafy Ro, MD;  Location: [facility] ORS;  Service: General;  Laterality: N/A;   CARDIAC CATHETERIZATION  2013   S/p PCI    CARDIAC CATHETERIZATION  2018   S/p CABG   CATARACT EXTRACTION Bilateral    COLONOSCOPY     CORONARY ARTERY BYPASS GRAFT  2018   (LIMA-LAD,VG-RCA,VG-OM1,VG-D1)   ESOPHAGOGASTRODUODENOSCOPY (EGD) WITH PROPOFOL N/A 04/04/2020   Procedure: ESOPHAGOGASTRODUODENOSCOPY (EGD) WITH PROPOFOL;  Surgeon: [name], MD;  Location: [facility] ENDOSCOPY;  Service: Endoscopy;  Laterality: N/A;   ESOPHAGOGASTRODUODENOSCOPY (EGD) WITH PROPOFOL N/A 06/06/2020   Procedure: ESOPHAGOGASTRODUODENOSCOPY (EGD) WITH PROPOFOL;  Surgeon: [name], MD;  Location: [facility] ENDOSCOPY;  Service: Endoscopy;  Laterality: N/A;   PERIPHERAL VASCULAR CATHETERIZATION Right 10/28/2016   PTA/DEB Right SFA   WRIST SURGERY Left    cyst    Prior to Admission medications   Medication Sig Start Date End Date Taking? Authorizing Provider  carvedilol (COREG) 12.5 MG tablet TAKE 1 1/2 TABLETS BY MOUTH TWICE DAILY 07/29/22  Yes Gollan, Tollie Pizza,  MD  ezetimibe (ZETIA) 10 MG tablet TAKE 1 TABLET(10 MG) BY MOUTH DAILY 06/20/22  Yes Gollan, Tollie Pizza, MD  isosorbide mononitrate (IMDUR) 30 MG 24 hr tablet Take 1 tablet (30 mg total) by mouth 2 (two) times daily. 07/18/22  Yes Antonieta Iba, MD  losartan (COZAAR) 25 MG tablet Take 25 mg by mouth every evening. 08/28/21  Yes [provider]  nortriptyline (PAMELOR) 10 MG capsule Take 50 mg by mouth at bedtime.   Yes [provider]  aspirin EC 81 MG tablet Take 81 mg by mouth daily. Swallow whole.    [provider]   cholecalciferol (VITAMIN D3) 25 MCG (1000 UNIT) tablet Take 2,000 Units by mouth 2 (two) times daily.    [provider]  docusate calcium (SURFAK) 240 MG capsule Take 240 mg by mouth daily.    [provider]  Insulin Pen Needle (B-D ULTRAFINE III SHORT PEN) 31G X 8 MM MISC To use with pen basal and mealtime coverage insulin pens 5 times daily. DX: E11.65 04/17/20   Carlean Jews, NP  NOVOLOG FLEXPEN 100 UNIT/ML FlexPen Inject into the skin 3 (three) times daily with meals. 3 x 's daily with meals - SSI 05/22/21   [provider]  pantoprazole (PROTONIX) 40 MG tablet Take 1 tablet (40 mg total) by mouth 2 (two) times daily before a meal. 01/16/22   Sallyanne Kuster, NP  polyethylene glycol-electrolytes (NULYTELY) 420 g solution 5pm night before Mix and drink 8oz every 20 mins until half of jug is gone 5 hours morning of procedure drink 8oz every 15 mins until gone 09/20/22   [name], MD  REPATHA SURECLICK 140 MG/ML SOAJ INJECT 140 MG UNDER THE SKIN EVERY 14 DAYS 06/20/22   Antonieta Iba, MD  Ruxolitinib Phosphate (OPZELURA) 1.5 % CREA Apply to skin daily, legs Patient taking differently: Apply 1 Application topically daily. Apply to skin daily, legs 09/12/21   Deirdre Evener, MD  torsemide (DEMADEX) 20 MG tablet Take 20 mg twice a day 05/13/22   Antonieta Iba, MD  TRESIBA FLEXTOUCH 200 UNIT/ML FlexTouch Pen 100 Units at bedtime. 08/12/21   [provider]    Allergies as of 08/29/2022 - Review Complete 08/29/2022  Allergen Reaction Noted   Iodinated contrast media Other (See Comments) 06/01/2020   Other Other (See Comments) 05/19/2019   Statins Other (See Comments) 05/19/2019   Pregabalin Other (See Comments) 05/31/2019   Sacubitril-valsartan Other (See Comments) 12/02/2019   Atorvastatin Other (See Comments) 05/31/2019   Pravastatin Other (See Comments) 05/31/2019   Rosuvastatin Other (See Comments) 12/09/2012    Family History   Problem Relation Age of Onset   Diabetes Mother    Heart disease Father     Social History   Socioeconomic History   Marital status: Married    Spouse name: Roxanne   Number of children: Not on file   Years of education: Not on file   Highest education level: Not on file  Occupational History   Not on file  Tobacco Use   Smoking status: Former    Packs/day: 0.25    Years: 39.00    Additional pack years: 0.00    Total pack years: 9.75    Types: Cigarettes    Passive exposure: Past   Smokeless tobacco: Never  Vaping Use   Vaping Use: Never used  Substance and Sexual Activity   Alcohol use: Not Currently    Comment: very rarely - wine  Drug use: Never   Sexual activity: Not on file  Other Topics Concern   Not on file  Social History Narrative   Not on file   Social Determinants of Health   Financial Resource Strain: Not on file  Food Insecurity: Not on file  Transportation Needs: Not on file  Physical Activity: Not on file  Stress: Not on file  Social Connections: Not on file  Intimate Partner Violence: Not on file    Review of Systems: See HPI, otherwise negative ROS  Physical Exam: BP (!) 180/99   Pulse 77   Temp (!) 96.6 F (35.9 C) (Temporal)   Resp 18   Ht 6' (1.829 m)   Wt 90.7 kg   SpO2 100%   BMI 27.12 kg/m  General:   Alert,  pleasant and cooperative in NAD Head:  Normocephalic and atraumatic. Neck:  Supple; no masses or thyromegaly. Lungs:  Clear throughout to auscultation.    Heart:  Regular rate and rhythm. Abdomen:  Soft, nontender and nondistended. Normal bowel sounds, without guarding, and without rebound.   Neurologic:  Alert and  oriented x4;  grossly normal neurologically.  Impression/Plan: [name] [name] is here for an endoscopy to be performed for barrett's esophagus  Risks, benefits, limitations, and alternatives regarding  endoscopy have been reviewed with the patient.  Questions have been answered.  All parties  agreeable.   [name], MD  09/24/2022, 7:46 AM

## 2022-09-25 ENCOUNTER — Telehealth: Payer: Self-pay | Admitting: Cardiovascular Disease

## 2022-09-25 ENCOUNTER — Encounter: Payer: Self-pay | Admitting: Gastroenterology

## 2022-09-25 DIAGNOSIS — N186 End stage renal disease: Secondary | ICD-10-CM | POA: Diagnosis not present

## 2022-09-25 DIAGNOSIS — Z23 Encounter for immunization: Secondary | ICD-10-CM | POA: Diagnosis not present

## 2022-09-25 DIAGNOSIS — Z992 Dependence on renal dialysis: Secondary | ICD-10-CM | POA: Diagnosis not present

## 2022-09-25 LAB — SURGICAL PATHOLOGY

## 2022-09-25 NOTE — Telephone Encounter (Signed)
*  STAT* If patient is at the pharmacy, call can be transferred to refill team.   1. Which medications need to be refilled? (please list name of each medication and dose if known) REPATHA SURECLICK 140 MG/ML SOAJ   2. Which pharmacy/location (including street and city if local pharmacy) is medication to be sent to?   [id number] - [place], [place] - [address] AT [facility] OF [address]    3. Do they need a 30 day or 90 day supply? Not sure what his insurance will cover

## 2022-09-26 ENCOUNTER — Other Ambulatory Visit: Payer: Self-pay

## 2022-09-26 DIAGNOSIS — Z992 Dependence on renal dialysis: Secondary | ICD-10-CM | POA: Diagnosis not present

## 2022-09-26 DIAGNOSIS — N186 End stage renal disease: Secondary | ICD-10-CM | POA: Diagnosis not present

## 2022-09-26 DIAGNOSIS — Z23 Encounter for immunization: Secondary | ICD-10-CM | POA: Diagnosis not present

## 2022-09-26 DIAGNOSIS — Z1211 Encounter for screening for malignant neoplasm of colon: Secondary | ICD-10-CM

## 2022-09-26 MED ORDER — PEG 3350-KCL-NA BICARB-NACL 420 G PO SOLR: ORAL | 0 refills | Status: DC

## 2022-09-27 ENCOUNTER — Other Ambulatory Visit: Payer: Self-pay | Admitting: Cardiovascular Disease

## 2022-09-27 DIAGNOSIS — Z23 Encounter for immunization: Secondary | ICD-10-CM | POA: Diagnosis not present

## 2022-09-27 DIAGNOSIS — N186 End stage renal disease: Secondary | ICD-10-CM | POA: Diagnosis not present

## 2022-09-27 DIAGNOSIS — Z992 Dependence on renal dialysis: Secondary | ICD-10-CM | POA: Diagnosis not present

## 2022-09-27 MED ORDER — REPATHA SURECLICK 140 MG/ML ~~LOC~~ SOAJ: SUBCUTANEOUS | 3 refills | Status: DC

## 2022-09-28 DIAGNOSIS — Z992 Dependence on renal dialysis: Secondary | ICD-10-CM | POA: Diagnosis not present

## 2022-09-28 DIAGNOSIS — N186 End stage renal disease: Secondary | ICD-10-CM | POA: Diagnosis not present

## 2022-09-28 DIAGNOSIS — Z23 Encounter for immunization: Secondary | ICD-10-CM | POA: Diagnosis not present

## 2022-09-29 DIAGNOSIS — Z23 Encounter for immunization: Secondary | ICD-10-CM | POA: Diagnosis not present

## 2022-09-29 DIAGNOSIS — Z992 Dependence on renal dialysis: Secondary | ICD-10-CM | POA: Diagnosis not present

## 2022-09-29 DIAGNOSIS — N186 End stage renal disease: Secondary | ICD-10-CM | POA: Diagnosis not present

## 2022-09-30 DIAGNOSIS — N186 End stage renal disease: Secondary | ICD-10-CM | POA: Diagnosis not present

## 2022-09-30 DIAGNOSIS — Z992 Dependence on renal dialysis: Secondary | ICD-10-CM | POA: Diagnosis not present

## 2022-09-30 DIAGNOSIS — Z23 Encounter for immunization: Secondary | ICD-10-CM | POA: Diagnosis not present

## 2022-10-01 DIAGNOSIS — N186 End stage renal disease: Secondary | ICD-10-CM | POA: Diagnosis not present

## 2022-10-01 DIAGNOSIS — Z992 Dependence on renal dialysis: Secondary | ICD-10-CM | POA: Diagnosis not present

## 2022-10-01 DIAGNOSIS — Z23 Encounter for immunization: Secondary | ICD-10-CM | POA: Diagnosis not present

## 2022-10-02 ENCOUNTER — Telehealth: Payer: Self-pay

## 2022-10-02 ENCOUNTER — Encounter: Payer: Self-pay | Admitting: Cardiovascular Disease

## 2022-10-02 ENCOUNTER — Other Ambulatory Visit (HOSPITAL_COMMUNITY): Payer: Self-pay

## 2022-10-02 DIAGNOSIS — D649 Anemia, unspecified: Secondary | ICD-10-CM | POA: Diagnosis not present

## 2022-10-02 DIAGNOSIS — N186 End stage renal disease: Secondary | ICD-10-CM | POA: Diagnosis not present

## 2022-10-02 DIAGNOSIS — E118 Type 2 diabetes mellitus with unspecified complications: Secondary | ICD-10-CM | POA: Diagnosis not present

## 2022-10-02 DIAGNOSIS — Z794 Long term (current) use of insulin: Secondary | ICD-10-CM | POA: Diagnosis not present

## 2022-10-02 DIAGNOSIS — Z23 Encounter for immunization: Secondary | ICD-10-CM | POA: Diagnosis not present

## 2022-10-02 DIAGNOSIS — Z992 Dependence on renal dialysis: Secondary | ICD-10-CM | POA: Diagnosis not present

## 2022-10-02 DIAGNOSIS — N250 Renal osteodystrophy: Secondary | ICD-10-CM | POA: Diagnosis not present

## 2022-10-02 NOTE — Telephone Encounter (Signed)
Pharmacy Patient Advocate Encounter   Received notification from [facility] MEDICARE that prior authorization for REPATHA is needed.    PA submitted on 10/02/22 Key [id number] Status is pending  Haze Rushing, CPhT Pharmacy Patient Advocate Specialist Direct Number: [phone] Fax: [phone]

## 2022-10-03 DIAGNOSIS — Z992 Dependence on renal dialysis: Secondary | ICD-10-CM | POA: Diagnosis not present

## 2022-10-03 DIAGNOSIS — Z23 Encounter for immunization: Secondary | ICD-10-CM | POA: Diagnosis not present

## 2022-10-03 DIAGNOSIS — N186 End stage renal disease: Secondary | ICD-10-CM | POA: Diagnosis not present

## 2022-10-04 DIAGNOSIS — Z992 Dependence on renal dialysis: Secondary | ICD-10-CM | POA: Diagnosis not present

## 2022-10-04 DIAGNOSIS — Z23 Encounter for immunization: Secondary | ICD-10-CM | POA: Diagnosis not present

## 2022-10-04 DIAGNOSIS — N186 End stage renal disease: Secondary | ICD-10-CM | POA: Diagnosis not present

## 2022-10-05 DIAGNOSIS — N186 End stage renal disease: Secondary | ICD-10-CM | POA: Diagnosis not present

## 2022-10-05 DIAGNOSIS — Z23 Encounter for immunization: Secondary | ICD-10-CM | POA: Diagnosis not present

## 2022-10-05 DIAGNOSIS — Z992 Dependence on renal dialysis: Secondary | ICD-10-CM | POA: Diagnosis not present

## 2022-10-06 DIAGNOSIS — Z23 Encounter for immunization: Secondary | ICD-10-CM | POA: Diagnosis not present

## 2022-10-06 DIAGNOSIS — Z992 Dependence on renal dialysis: Secondary | ICD-10-CM | POA: Diagnosis not present

## 2022-10-06 DIAGNOSIS — N186 End stage renal disease: Secondary | ICD-10-CM | POA: Diagnosis not present

## 2022-10-07 DIAGNOSIS — Z992 Dependence on renal dialysis: Secondary | ICD-10-CM | POA: Diagnosis not present

## 2022-10-07 DIAGNOSIS — Z23 Encounter for immunization: Secondary | ICD-10-CM | POA: Diagnosis not present

## 2022-10-07 DIAGNOSIS — N186 End stage renal disease: Secondary | ICD-10-CM | POA: Diagnosis not present

## 2022-10-08 ENCOUNTER — Other Ambulatory Visit (HOSPITAL_COMMUNITY): Payer: Self-pay

## 2022-10-08 DIAGNOSIS — Z23 Encounter for immunization: Secondary | ICD-10-CM | POA: Diagnosis not present

## 2022-10-08 DIAGNOSIS — Z992 Dependence on renal dialysis: Secondary | ICD-10-CM | POA: Diagnosis not present

## 2022-10-08 DIAGNOSIS — N186 End stage renal disease: Secondary | ICD-10-CM | POA: Diagnosis not present

## 2022-10-09 DIAGNOSIS — N186 End stage renal disease: Secondary | ICD-10-CM | POA: Diagnosis not present

## 2022-10-09 DIAGNOSIS — Z992 Dependence on renal dialysis: Secondary | ICD-10-CM | POA: Diagnosis not present

## 2022-10-09 DIAGNOSIS — Z23 Encounter for immunization: Secondary | ICD-10-CM | POA: Diagnosis not present

## 2022-10-10 DIAGNOSIS — N186 End stage renal disease: Secondary | ICD-10-CM | POA: Diagnosis not present

## 2022-10-10 DIAGNOSIS — Z23 Encounter for immunization: Secondary | ICD-10-CM | POA: Diagnosis not present

## 2022-10-10 DIAGNOSIS — Z992 Dependence on renal dialysis: Secondary | ICD-10-CM | POA: Diagnosis not present

## 2022-10-11 ENCOUNTER — Other Ambulatory Visit (HOSPITAL_COMMUNITY): Payer: Self-pay

## 2022-10-11 DIAGNOSIS — N186 End stage renal disease: Secondary | ICD-10-CM | POA: Diagnosis not present

## 2022-10-11 DIAGNOSIS — Z23 Encounter for immunization: Secondary | ICD-10-CM | POA: Diagnosis not present

## 2022-10-11 DIAGNOSIS — Z992 Dependence on renal dialysis: Secondary | ICD-10-CM | POA: Diagnosis not present

## 2022-10-11 NOTE — Telephone Encounter (Signed)
Pharmacy Patient Advocate Encounter  Prior Authorization for REPATHA has been approved.    Effective dates: 10/11/22 through 10/11/23   [name] [name], CPhT Pharmacy Patient Advocate Specialist Direct Number: [phone] Fax: [phone]

## 2022-10-11 NOTE — Telephone Encounter (Signed)
ERROR IN CMM, CANNOT VERIFY PATIENT   REQUEST RESUBMITTED VIA BLUE E

## 2022-10-12 DIAGNOSIS — Z23 Encounter for immunization: Secondary | ICD-10-CM | POA: Diagnosis not present

## 2022-10-12 DIAGNOSIS — Z992 Dependence on renal dialysis: Secondary | ICD-10-CM | POA: Diagnosis not present

## 2022-10-12 DIAGNOSIS — N186 End stage renal disease: Secondary | ICD-10-CM | POA: Diagnosis not present

## 2022-10-13 DIAGNOSIS — Z992 Dependence on renal dialysis: Secondary | ICD-10-CM | POA: Diagnosis not present

## 2022-10-13 DIAGNOSIS — N186 End stage renal disease: Secondary | ICD-10-CM | POA: Diagnosis not present

## 2022-10-13 DIAGNOSIS — Z23 Encounter for immunization: Secondary | ICD-10-CM | POA: Diagnosis not present

## 2022-10-14 DIAGNOSIS — N186 End stage renal disease: Secondary | ICD-10-CM | POA: Diagnosis not present

## 2022-10-14 DIAGNOSIS — Z23 Encounter for immunization: Secondary | ICD-10-CM | POA: Diagnosis not present

## 2022-10-14 DIAGNOSIS — Z992 Dependence on renal dialysis: Secondary | ICD-10-CM | POA: Diagnosis not present

## 2022-10-15 DIAGNOSIS — Z992 Dependence on renal dialysis: Secondary | ICD-10-CM | POA: Diagnosis not present

## 2022-10-15 DIAGNOSIS — N186 End stage renal disease: Secondary | ICD-10-CM | POA: Diagnosis not present

## 2022-10-15 DIAGNOSIS — H35033 Hypertensive retinopathy, bilateral: Secondary | ICD-10-CM | POA: Diagnosis not present

## 2022-10-15 DIAGNOSIS — H59813 Chorioretinal scars after surgery for detachment, bilateral: Secondary | ICD-10-CM | POA: Diagnosis not present

## 2022-10-15 DIAGNOSIS — Z23 Encounter for immunization: Secondary | ICD-10-CM | POA: Diagnosis not present

## 2022-10-15 DIAGNOSIS — H43823 Vitreomacular adhesion, bilateral: Secondary | ICD-10-CM | POA: Diagnosis not present

## 2022-10-15 DIAGNOSIS — E113513 Type 2 diabetes mellitus with proliferative diabetic retinopathy with macular edema, bilateral: Secondary | ICD-10-CM | POA: Diagnosis not present

## 2022-10-16 DIAGNOSIS — Z992 Dependence on renal dialysis: Secondary | ICD-10-CM | POA: Diagnosis not present

## 2022-10-16 DIAGNOSIS — N186 End stage renal disease: Secondary | ICD-10-CM | POA: Diagnosis not present

## 2022-10-16 DIAGNOSIS — Z23 Encounter for immunization: Secondary | ICD-10-CM | POA: Diagnosis not present

## 2022-10-17 ENCOUNTER — Ambulatory Visit: Payer: Medicare Other | Admitting: Anesthesiology

## 2022-10-17 ENCOUNTER — Encounter: Payer: Self-pay | Admitting: Gastroenterology

## 2022-10-17 ENCOUNTER — Encounter
Admission: RE | Disposition: A | Discharge status: Home / Self Care | Payer: Self-pay | Source: Ambulatory Visit | Attending: Gastroenterology

## 2022-10-17 ENCOUNTER — Ambulatory Visit
Admission: RE | Admit: 2022-10-17 | Discharge: 2022-10-17 | Disposition: A | Discharge status: Home / Self Care | Payer: Medicare Other | Source: Ambulatory Visit | Attending: Gastroenterology | Admitting: Gastroenterology

## 2022-10-17 DIAGNOSIS — Z87891 Personal history of nicotine dependence: Secondary | ICD-10-CM | POA: Insufficient documentation

## 2022-10-17 DIAGNOSIS — Z992 Dependence on renal dialysis: Secondary | ICD-10-CM | POA: Diagnosis not present

## 2022-10-17 DIAGNOSIS — I2510 Atherosclerotic heart disease of native coronary artery without angina pectoris: Secondary | ICD-10-CM | POA: Insufficient documentation

## 2022-10-17 DIAGNOSIS — I5020 Unspecified systolic (congestive) heart failure: Secondary | ICD-10-CM | POA: Insufficient documentation

## 2022-10-17 DIAGNOSIS — K641 Second degree hemorrhoids: Secondary | ICD-10-CM | POA: Insufficient documentation

## 2022-10-17 DIAGNOSIS — I252 Old myocardial infarction: Secondary | ICD-10-CM | POA: Diagnosis not present

## 2022-10-17 DIAGNOSIS — E1022 Type 1 diabetes mellitus with diabetic chronic kidney disease: Secondary | ICD-10-CM | POA: Diagnosis not present

## 2022-10-17 DIAGNOSIS — Z955 Presence of coronary angioplasty implant and graft: Secondary | ICD-10-CM | POA: Insufficient documentation

## 2022-10-17 DIAGNOSIS — I130 Hypertensive heart and chronic kidney disease with heart failure and stage 1 through stage 4 chronic kidney disease, or unspecified chronic kidney disease: Secondary | ICD-10-CM | POA: Insufficient documentation

## 2022-10-17 DIAGNOSIS — K219 Gastro-esophageal reflux disease without esophagitis: Secondary | ICD-10-CM | POA: Insufficient documentation

## 2022-10-17 DIAGNOSIS — Z794 Long term (current) use of insulin: Secondary | ICD-10-CM | POA: Insufficient documentation

## 2022-10-17 DIAGNOSIS — K649 Unspecified hemorrhoids: Secondary | ICD-10-CM | POA: Diagnosis not present

## 2022-10-17 DIAGNOSIS — D120 Benign neoplasm of cecum: Secondary | ICD-10-CM | POA: Diagnosis not present

## 2022-10-17 DIAGNOSIS — Z1211 Encounter for screening for malignant neoplasm of colon: Secondary | ICD-10-CM

## 2022-10-17 DIAGNOSIS — E1051 Type 1 diabetes mellitus with diabetic peripheral angiopathy without gangrene: Secondary | ICD-10-CM | POA: Insufficient documentation

## 2022-10-17 DIAGNOSIS — J449 Chronic obstructive pulmonary disease, unspecified: Secondary | ICD-10-CM | POA: Diagnosis not present

## 2022-10-17 DIAGNOSIS — Z09 Encounter for follow-up examination after completed treatment for conditions other than malignant neoplasm: Secondary | ICD-10-CM | POA: Insufficient documentation

## 2022-10-17 DIAGNOSIS — K635 Polyp of colon: Secondary | ICD-10-CM | POA: Diagnosis not present

## 2022-10-17 DIAGNOSIS — N184 Chronic kidney disease, stage 4 (severe): Secondary | ICD-10-CM | POA: Insufficient documentation

## 2022-10-17 DIAGNOSIS — G4730 Sleep apnea, unspecified: Secondary | ICD-10-CM | POA: Diagnosis not present

## 2022-10-17 DIAGNOSIS — Z23 Encounter for immunization: Secondary | ICD-10-CM | POA: Diagnosis not present

## 2022-10-17 DIAGNOSIS — Z951 Presence of aortocoronary bypass graft: Secondary | ICD-10-CM | POA: Diagnosis not present

## 2022-10-17 DIAGNOSIS — Z8616 Personal history of COVID-19: Secondary | ICD-10-CM | POA: Diagnosis not present

## 2022-10-17 DIAGNOSIS — N186 End stage renal disease: Secondary | ICD-10-CM | POA: Diagnosis not present

## 2022-10-17 HISTORY — PX: COLONOSCOPY WITH PROPOFOL: SHX5780

## 2022-10-17 LAB — GLUCOSE, CAPILLARY
Glucose-Capillary: 94 mg/dL (ref 70–99)
Glucose-Capillary: 93 mg/dL (ref 70–99)

## 2022-10-17 SURGERY — COLONOSCOPY WITH PROPOFOL
Anesthesia: General

## 2022-10-17 MED ORDER — SODIUM CHLORIDE 0.9 % IV SOLN: INTRAVENOUS | Status: DC

## 2022-10-17 MED ORDER — PROPOFOL 10 MG/ML IV BOLUS
INTRAVENOUS | Status: DC | PRN
  Administered 2022-10-17: 50 mg via INTRAVENOUS
  Administered 2022-10-17: 30 mg via INTRAVENOUS

## 2022-10-17 MED ORDER — PROPOFOL 500 MG/50ML IV EMUL
INTRAVENOUS | Status: DC | PRN
  Administered 2022-10-17: 110 ug/kg/min via INTRAVENOUS

## 2022-10-17 NOTE — Transfer of Care (Signed)
Immediate Anesthesia Transfer of Care Note  Patient: [name] [name]  Procedure(s) Performed: COLONOSCOPY WITH PROPOFOL  Patient Location: PACU  Anesthesia Type:General  Level of Consciousness: drowsy  Airway & Oxygen Therapy: Pt spontaneously breathing and connected to nasal cannula  Post-op Assessment: Report given to RN and Post -op Vital signs reviewed and stable  Post vital signs: Reviewed and stable  Last Vitals:  Vitals Value Taken Time  BP    Temp    Pulse    Resp    SpO2      Last Pain:  Vitals:   10/17/22 0744  TempSrc: Temporal  PainSc: 0-No pain         Complications: No notable events documented.

## 2022-10-17 NOTE — Anesthesia Postprocedure Evaluation (Signed)
Anesthesia Post Note  Patient: [name] [name]  Procedure(s) Performed: COLONOSCOPY WITH PROPOFOL  Patient location during evaluation: PACU Anesthesia Type: General Level of consciousness: awake Pain management: pain level controlled Vital Signs Assessment: post-procedure vital signs reviewed and stable Respiratory status: nonlabored ventilation and respiratory function stable Cardiovascular status: blood pressure returned to baseline Anesthetic complications: no   No notable events documented.   Last Vitals:  Vitals:   10/17/22 0854 10/17/22 0904  BP: (!) 145/86   Pulse: (!) 56 (!) 57  Resp: 15 12  Temp:    SpO2: 100% 99%    Last Pain:  Vitals:   10/17/22 0904  TempSrc:   PainSc: 0-No pain                 VAN STAVEREN,Darrien Laakso

## 2022-10-17 NOTE — Anesthesia Procedure Notes (Signed)
Date/Time: 10/17/2022 8:06 AM  Performed by: Malva Cogan, CRNAPre-anesthesia Checklist: Patient identified, Emergency Drugs available, Suction available, Patient being monitored and Timeout performed Patient Re-evaluated:Patient Re-evaluated prior to induction Oxygen Delivery Method: Nasal cannula Induction Type: IV induction Placement Confirmation: CO2 detector and positive ETCO2

## 2022-10-17 NOTE — Op Note (Signed)
[facility] Gastroenterology Patient Name: Matheo Amory Procedure Date: 10/17/2022 8:05 AM MRN: [id number] Account #: [id number] Date of Birth: [birth year] Admit Type: Outpatient Age: 62 Room: [facility] ENDO ROOM 4 Gender: Male Note Status: Finalized Instrument Name: Colonoscope [id number] Procedure:             Colonoscopy Indications:           Screening for colorectal malignant neoplasm Providers:             [name] MD, MD Referring MD:          Lyndon Code, MD (Referring MD) Medicines:             Propofol per Anesthesia Complications:         No immediate complications. Procedure:             Pre-Anesthesia Assessment:                        - Prior to the procedure, a History and Physical was                         performed, and patient medications and allergies were                         reviewed. The patient's tolerance of previous                         anesthesia was also reviewed. The risks and benefits                         of the procedure and the sedation options and risks                         were discussed with the patient. All questions were                         answered, and informed consent was obtained. Prior                         Anticoagulants: The patient has taken no anticoagulant                         or antiplatelet agents. ASA Grade Assessment: II - A                         patient with mild systemic disease. After reviewing                         the risks and benefits, the patient was deemed in                         satisfactory condition to undergo the procedure.                        After obtaining informed consent, the colonoscope was                         passed under direct vision. Throughout the procedure,  the patient's blood pressure, pulse, and oxygen                         saturations were monitored continuously. The                         Colonoscope was introduced through  the anus and                         advanced to the the cecum, identified by appendiceal                         orifice and ileocecal valve. The colonoscopy was                         performed without difficulty. The patient tolerated                         the procedure well. The quality of the bowel                         preparation was good. Findings:      The perianal and digital rectal examinations were normal.      A 2 mm polyp was found in the cecum. The polyp was sessile. The polyp       was removed with a cold biopsy forceps. Resection and retrieval were       complete.      Five sessile polyps were found in the ascending colon. The polyps were 3       to 7 mm in size. These polyps were removed with a cold snare. Resection       and retrieval were complete.      Four sessile polyps were found in the transverse colon. The polyps were       2 to 5 mm in size. These polyps were removed with a cold snare.       Resection and retrieval were complete.      Non-bleeding internal hemorrhoids were found during retroflexion. The       hemorrhoids were Grade II (internal hemorrhoids that prolapse but reduce       spontaneously). Impression:            - One 2 mm polyp in the cecum, removed with a cold                         biopsy forceps. Resected and retrieved.                        - Five 3 to 7 mm polyps in the ascending colon,                         removed with a cold snare. Resected and retrieved.                        - Four 2 to 5 mm polyps in the transverse colon,                         removed with a cold snare. Resected and retrieved.                        -  Non-bleeding internal hemorrhoids. Recommendation:        - Discharge patient to home.                        - Resume previous diet.                        - Continue present medications.                        - Await pathology results.                        - If the pathology report reveals adenomatous  tissue,                         then repeat the colonoscopy for surveillance in 1 year. Procedure Code(s):     --- Professional ---                        [phone], Colonoscopy, flexible; with removal of                         tumor(s), polyp(s), or other lesion(s) by snare                         technique                        [id number], 59, Colonoscopy, flexible; with biopsy, single                         or multiple Diagnosis Code(s):     --- Professional ---                        Z12.11, Encounter for screening for malignant neoplasm                         of colon                        D12.0, Benign neoplasm of cecum CPT copyright 2022 American [employer] Association. All rights reserved. The codes documented in this report are preliminary and upon coder review may  be revised to meet current compliance requirements. [name] MD, MD 10/17/2022 8:33:16 AM This report has been signed electronically. Number of Addenda: 0 Note Initiated On: 10/17/2022 8:05 AM Scope Withdrawal Time: 0 hours 14 minutes 11 seconds  Total Procedure Duration: 0 hours 21 minutes 35 seconds  Estimated Blood Loss:  Estimated blood loss: none.      [facility]

## 2022-10-17 NOTE — Anesthesia Preprocedure Evaluation (Signed)
Anesthesia Evaluation  Patient identified by MRN, date of birth, ID band Patient awake    Reviewed: Allergy & Precautions, NPO status , Patient's Chart, lab work & pertinent test results  Airway Mallampati: III  TM Distance: >3 FB Neck ROM: full    Dental  (+) Partial Upper   Pulmonary neg pulmonary ROS, shortness of breath, with exertion and lying, sleep apnea , pneumonia, COPD, former smoker   Pulmonary exam normal  + decreased breath sounds      Cardiovascular Exercise Tolerance: Poor hypertension, Pt. on medications + CAD, + Past MI, + Peripheral Vascular Disease and +CHF  negative cardio ROS Normal cardiovascular exam Rhythm:Regular Rate:Normal     Neuro/Psych  Headaches PSYCHIATRIC DISORDERS Anxiety Depression     Neuromuscular disease negative neurological ROS  negative psych ROS   GI/Hepatic negative GI ROS, Neg liver ROS, PUD,GERD  Medicated,,  Endo/Other  negative endocrine ROSdiabetes, Type 1, Insulin Dependent    Renal/GU ESRFRenal diseasenegative Renal ROS  negative genitourinary   Musculoskeletal  (+) Arthritis ,    Abdominal  (+) + obese  Peds  Hematology negative hematology ROS (+) Blood dyscrasia   Anesthesia Other Findings Past Medical History: No date: Anemia No date: Arthritis No date: CAD (coronary artery disease)     Comment:  a. 12/2016 s/p CABG x 4 (LIMA->LAD, VG->RCA, VG->OM1,               VG->D1); b. 02/2018 MV: EF 35%, small-med inferolaterlal               infarct. No ischemia. 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 02/2019 EF               40-45%; d.) 08/2019  Echo: EF 50-55%, no rwma, GrII DD; e.)              04/2020 Echo: EF 30-35%, mod-sev glob HK. Mild LVH. G2DD.              Low-nl RV fxn. Mildly dil LA. Mild MR. Mild-mod Ao               sclerosis w/o stenosis; f.) TTE 02/09/2021: EF 55%;               septal HK, LAE; G2DD. 02/08/2021: History of 2019 novel coronavirus disease (COVID-19) No date: Hyperlipidemia No date: Hypertension No date: Ischemic cardiomyopathy     Comment:  a.) 02/2018 MV: EF 35%; b.) 08/2019 Echo: EF 50-55%; c.)               04/2020 Echo: EF 30-35%; d.) TTE 02/09/2021: EF 50-55%. No date: Lymphedema No date: Migraine No date: Myocardial infarction [facility]) No date: Neuropathy No date: PAD (peripheral artery disease) (HCC)     Comment:  a. 04/2017 Aortoiliac duplex: R iliac dzs; b. 03/2020 LE              Duplex: mild bilat atherosclerosis throughout. Patent               vessels. No date: Pneumonia No date: Retinopathy No date: S/P CABG x 4     Comment:  a.)  4v CABG: LIMA->LAD,  SVG->RCA, SVG->OM1, SVG->D1 No date: Sleep apnea     Comment:  a.) does not require nocturnal PAP therapy No date: Statin intolerance No date: Stomach ulcer No date: T2DM (type 2 diabetes mellitus) (HCC)  Past Surgical History: No date: bone graft surgery; Bilateral     Comment:  on both feet x 2 11/13/2021: CAPD INSERTION; N/A     Comment:  Procedure: LAPAROSCOPIC INSERTION CONTINUOUS AMBULATORY               PERITONEAL DIALYSIS  (CAPD) CATHETER;  Surgeon: Leafy Ro, MD;  Location: [facility] ORS;  Service: General;                Laterality: N/A; 01/10/2022: CAPD REVISION; N/A     Comment:  Procedure: LAPAROSCOPIC REVISION CONTINUOUS AMBULATORY               PERITONEAL DIALYSIS  (CAPD) CATHETER;  Surgeon: Leafy Ro, MD;  Location: [facility] ORS;  Service: General;                Laterality: N/A; 2013: CARDIAC CATHETERIZATION     Comment:  S/p PCI  2018: CARDIAC CATHETERIZATION     Comment:  S/p  CABG No date: CATARACT EXTRACTION; Bilateral No date: COLONOSCOPY 2018: CORONARY ARTERY BYPASS GRAFT     Comment:  (LIMA-LAD,VG-RCA,VG-OM1,VG-D1) 04/04/2020: ESOPHAGOGASTRODUODENOSCOPY (EGD) WITH PROPOFOL; N/A     Comment:  Procedure: ESOPHAGOGASTRODUODENOSCOPY (EGD) WITH               PROPOFOL;  Surgeon: [name], MD;  Location: [facility]               ENDOSCOPY;  Service: Endoscopy;  Laterality: N/A; 06/06/2020: ESOPHAGOGASTRODUODENOSCOPY (EGD) WITH PROPOFOL; N/A     Comment:  Procedure: ESOPHAGOGASTRODUODENOSCOPY (EGD) WITH               PROPOFOL;  Surgeon: [name], MD;  Location: [facility]               ENDOSCOPY;  Service: Endoscopy;  Laterality: N/A; 09/24/2022: ESOPHAGOGASTRODUODENOSCOPY (EGD) WITH PROPOFOL; N/A     Comment:  Procedure: ESOPHAGOGASTRODUODENOSCOPY (EGD) WITH               PROPOFOL;  Surgeon: [name], MD;  Location: [facility]               ENDOSCOPY;  Service: Endoscopy;  Laterality: N/A; 10/28/2016: PERIPHERAL VASCULAR CATHETERIZATION; Right     Comment:  PTA/DEB Right SFA No date: WRIST SURGERY; Left     Comment:  cyst  BMI    Body Mass Index: 25.42 kg/m      Reproductive/Obstetrics negative OB ROS                             Anesthesia Physical Anesthesia Plan  ASA: 4  Anesthesia Plan: General   Post-op Pain Management:    Induction: Intravenous  PONV Risk Score and Plan: Propofol infusion and TIVA  Airway Management Planned: Natural Airway  Additional Equipment:   Intra-op Plan:   Post-operative Plan:   Informed Consent: I have reviewed the patients History and Physical, chart, labs and discussed the procedure including the risks, benefits and alternatives for the proposed anesthesia with the patient or authorized representative who has indicated his/her understanding and acceptance.  Dental Advisory Given  Plan Discussed with: CRNA and Surgeon  Anesthesia Plan Comments:        Anesthesia Quick  Evaluation

## 2022-10-17 NOTE — H&P (Signed)
[name] Minium, MD [facility] [address] [place], [place] [address] Phone: [phone] Fax : [phone]  Primary Care Physician:  Lyndon Code, MD Primary Gastroenterologist:  Dr. Servando Snare  Pre-Procedure History & Physical: HPI:  [name] [name] is a 62 y.o. male is here for a screening colonoscopy.   Past Medical History:  Diagnosis Date   Anemia    Arthritis    CAD (coronary artery disease)    a. 12/2016 s/p CABG x 4 (LIMA->LAD, VG->RCA, VG->OM1, VG->D1); b. 02/2018 MV: EF 35%, small-med inferolaterlal infarct. No ischemia.   CKD (chronic kidney disease), stage IV [facility])    Peritoneal Dialysis   Colon polyps    COPD (chronic obstructive pulmonary disease) (HCC)    Dupre's syndrome    GERD (gastroesophageal reflux disease)    HFrEF (heart failure reduced ejection fraction) (HCC)    a.) 2018 EF 35%; b.) 02/2018 EF 50%; c). 02/2019 EF 40-45%; d.) 08/2019 Echo: EF 50-55%, no rwma, GrII DD; e.) 04/2020 Echo: EF 30-35%, mod-sev glob HK. Mild LVH. G2DD. Low-nl RV fxn. Mildly dil LA. Mild MR. Mild-mod Ao sclerosis w/o stenosis; f.) TTE 02/09/2021: EF 55%; septal HK, LAE; G2DD.   History of 2019 novel coronavirus disease (COVID-19) 02/08/2021   Hyperlipidemia    Hypertension    Ischemic cardiomyopathy    a.) 02/2018 MV: EF 35%; b.) 08/2019 Echo: EF 50-55%; c.) 04/2020 Echo: EF 30-35%; d.) TTE 02/09/2021: EF 50-55%.   Lymphedema    Migraine    Myocardial infarction [facility])    Neuropathy    PAD (peripheral artery disease) (HCC)    a. 04/2017 Aortoiliac duplex: R iliac dzs; b. 03/2020 LE Duplex: mild bilat atherosclerosis throughout. Patent vessels.   Pneumonia    Retinopathy    S/P CABG x 4    a.)  4v CABG: LIMA->LAD, SVG->RCA, SVG->OM1, SVG->D1   Sleep apnea    a.) does not require nocturnal PAP therapy   Statin intolerance    Stomach ulcer    T2DM (type 2 diabetes mellitus) (HCC)     Past Surgical History:  Procedure Laterality Date   bone graft surgery Bilateral    on  both feet x 2   CAPD INSERTION N/A 11/13/2021   Procedure: LAPAROSCOPIC INSERTION CONTINUOUS AMBULATORY PERITONEAL DIALYSIS  (CAPD) CATHETER;  Surgeon: Leafy Ro, MD;  Location: [facility] ORS;  Service: General;  Laterality: N/A;   CAPD REVISION N/A 01/10/2022   Procedure: LAPAROSCOPIC REVISION CONTINUOUS AMBULATORY PERITONEAL DIALYSIS  (CAPD) CATHETER;  Surgeon: Leafy Ro, MD;  Location: [facility] ORS;  Service: General;  Laterality: N/A;   CARDIAC CATHETERIZATION  2013   S/p PCI    CARDIAC CATHETERIZATION  2018   S/p CABG   CATARACT EXTRACTION Bilateral    COLONOSCOPY     CORONARY ARTERY BYPASS GRAFT  2018   (LIMA-LAD,VG-RCA,VG-OM1,VG-D1)   ESOPHAGOGASTRODUODENOSCOPY (EGD) WITH PROPOFOL N/A 04/04/2020   Procedure: ESOPHAGOGASTRODUODENOSCOPY (EGD) WITH PROPOFOL;  Surgeon: [name] Minium, MD;  Location: [facility] ENDOSCOPY;  Service: Endoscopy;  Laterality: N/A;   ESOPHAGOGASTRODUODENOSCOPY (EGD) WITH PROPOFOL N/A 06/06/2020   Procedure: ESOPHAGOGASTRODUODENOSCOPY (EGD) WITH PROPOFOL;  Surgeon: [name] Minium, MD;  Location: [facility] ENDOSCOPY;  Service: Endoscopy;  Laterality: N/A;   ESOPHAGOGASTRODUODENOSCOPY (EGD) WITH PROPOFOL N/A 09/24/2022   Procedure: ESOPHAGOGASTRODUODENOSCOPY (EGD) WITH PROPOFOL;  Surgeon: [name] Minium, MD;  Location: [facility] ENDOSCOPY;  Service: Endoscopy;  Laterality: N/A;   PERIPHERAL VASCULAR CATHETERIZATION Right 10/28/2016   PTA/DEB Right SFA   WRIST SURGERY Left    cyst  Prior to Admission medications   Medication Sig Start Date End Date Taking? Authorizing Provider  carvedilol (COREG) 12.5 MG tablet TAKE 1 1/2 TABLETS BY MOUTH TWICE DAILY 07/29/22  Yes Gollan, Tollie Pizza, MD  isosorbide mononitrate (IMDUR) 30 MG 24 hr tablet Take 1 tablet (30 mg total) by mouth 2 (two) times daily. 07/18/22  Yes Antonieta Iba, MD  losartan (COZAAR) 25 MG tablet Take 25 mg by mouth every evening. 08/28/21  Yes [provider]  pantoprazole (PROTONIX) 40 MG tablet Take 1 tablet  (40 mg total) by mouth 2 (two) times daily before a meal. 01/16/22  Yes Abernathy, Alyssa, NP  aspirin EC 81 MG tablet Take 81 mg by mouth daily. Swallow whole.    [provider]  cholecalciferol (VITAMIN D3) 25 MCG (1000 UNIT) tablet Take 2,000 Units by mouth 2 (two) times daily.    [provider]  docusate calcium (SURFAK) 240 MG capsule Take 240 mg by mouth daily.    [provider]  Evolocumab (REPATHA SURECLICK) 140 MG/ML SOAJ INJECT 140 MG UNDER THE SKIN EVERY 14 DAYS 09/27/22   Antonieta Iba, MD  ezetimibe (ZETIA) 10 MG tablet TAKE 1 TABLET(10 MG) BY MOUTH DAILY 06/20/22   Antonieta Iba, MD  Insulin Pen Needle (B-D ULTRAFINE III SHORT PEN) 31G X 8 MM MISC To use with pen basal and mealtime coverage insulin pens 5 times daily. DX: E11.65 04/17/20   Carlean Jews, NP  nortriptyline (PAMELOR) 10 MG capsule Take 50 mg by mouth at bedtime.    [provider]  NOVOLOG FLEXPEN 100 UNIT/ML FlexPen Inject into the skin 3 (three) times daily with meals. 3 x 's daily with meals - SSI 05/22/21   [provider]  polyethylene glycol-electrolytes (NULYTELY) 420 g solution 5pm two nights before procedure Mix and drink 8oz every 20 mins until half of is gone rthen 5pm the night before procedure drink 8oz every 20 mins until gone 5 hours morning of procedure mix and drink 8oz every 15 mins until 1/2 of 2nd jug is gone 09/26/22   [name] Minium, MD  Ruxolitinib Phosphate (OPZELURA) 1.5 % CREA Apply to skin daily, legs Patient taking differently: Apply 1 Application topically daily. Apply to skin daily, legs 09/12/21   Deirdre Evener, MD  torsemide (DEMADEX) 20 MG tablet Take 1 tablet (20 mg total) by mouth 2 (two) times daily. 09/27/22   Antonieta Iba, MD  TRESIBA FLEXTOUCH 200 UNIT/ML FlexTouch Pen 100 Units at bedtime. 08/12/21   [provider]    Allergies as of 09/27/2022 - Review Complete 09/24/2022  Allergen Reaction Noted   Iodinated  contrast media Other (See Comments) 06/01/2020   Other Other (See Comments) 05/19/2019   Statins Other (See Comments) 05/19/2019   Pregabalin Other (See Comments) 05/31/2019   Sacubitril-valsartan Other (See Comments) 12/02/2019   Atorvastatin Other (See Comments) 05/31/2019   Pravastatin Other (See Comments) 05/31/2019   Rosuvastatin Other (See Comments) 12/09/2012    Family History  Problem Relation Age of Onset   Diabetes Mother    Heart disease Father     Social History   Socioeconomic History   Marital status: Married    Spouse name: Roxanne   Number of children: Not on file   Years of education: Not on file   Highest education level: Not on file  Occupational History   Not on file  Tobacco Use   Smoking status: Former    Packs/day: 0.25  Years: 39.00    Additional pack years: 0.00    Total pack years: 9.75    Types: Cigarettes    Passive exposure: Past   Smokeless tobacco: Never  Vaping Use   Vaping Use: Never used  Substance and Sexual Activity   Alcohol use: Not Currently    Comment: very rarely - wine   Drug use: Never   Sexual activity: Not on file  Other Topics Concern   Not on file  Social History Narrative   Not on file   Social Determinants of Health   Financial Resource Strain: Not on file  Food Insecurity: Not on file  Transportation Needs: Not on file  Physical Activity: Not on file  Stress: Not on file  Social Connections: Not on file  Intimate Partner Violence: Not on file    Review of Systems: See HPI, otherwise negative ROS  Physical Exam: There were no vitals taken for this visit. General:   Alert,  pleasant and cooperative in NAD Head:  Normocephalic and atraumatic. Neck:  Supple; no masses or thyromegaly. Lungs:  Clear throughout to auscultation.    Heart:  Regular rate and rhythm. Abdomen:  Soft, nontender and nondistended. Normal bowel sounds, without guarding, and without rebound.   Neurologic:  Alert and  oriented x4;   grossly normal neurologically.  Impression/Plan: [name] [name] is now here to undergo a screening colonoscopy.  Risks, benefits, and alternatives regarding colonoscopy have been reviewed with the patient.  Questions have been answered.  All parties agreeable.

## 2022-10-18 ENCOUNTER — Encounter: Payer: Self-pay | Admitting: Gastroenterology

## 2022-10-18 DIAGNOSIS — N186 End stage renal disease: Secondary | ICD-10-CM | POA: Diagnosis not present

## 2022-10-18 DIAGNOSIS — Z23 Encounter for immunization: Secondary | ICD-10-CM | POA: Diagnosis not present

## 2022-10-18 DIAGNOSIS — Z992 Dependence on renal dialysis: Secondary | ICD-10-CM | POA: Diagnosis not present

## 2022-10-19 DIAGNOSIS — Z23 Encounter for immunization: Secondary | ICD-10-CM | POA: Diagnosis not present

## 2022-10-19 DIAGNOSIS — Z992 Dependence on renal dialysis: Secondary | ICD-10-CM | POA: Diagnosis not present

## 2022-10-19 DIAGNOSIS — N186 End stage renal disease: Secondary | ICD-10-CM | POA: Diagnosis not present

## 2022-10-20 DIAGNOSIS — Z992 Dependence on renal dialysis: Secondary | ICD-10-CM | POA: Diagnosis not present

## 2022-10-20 DIAGNOSIS — N186 End stage renal disease: Secondary | ICD-10-CM | POA: Diagnosis not present

## 2022-10-20 DIAGNOSIS — Z23 Encounter for immunization: Secondary | ICD-10-CM | POA: Diagnosis not present

## 2022-10-21 DIAGNOSIS — N186 End stage renal disease: Secondary | ICD-10-CM | POA: Diagnosis not present

## 2022-10-21 DIAGNOSIS — Z23 Encounter for immunization: Secondary | ICD-10-CM | POA: Diagnosis not present

## 2022-10-21 DIAGNOSIS — Z992 Dependence on renal dialysis: Secondary | ICD-10-CM | POA: Diagnosis not present

## 2022-10-22 DIAGNOSIS — Z992 Dependence on renal dialysis: Secondary | ICD-10-CM | POA: Diagnosis not present

## 2022-10-22 DIAGNOSIS — N186 End stage renal disease: Secondary | ICD-10-CM | POA: Diagnosis not present

## 2022-10-22 DIAGNOSIS — Z23 Encounter for immunization: Secondary | ICD-10-CM | POA: Diagnosis not present

## 2022-10-23 DIAGNOSIS — N186 End stage renal disease: Secondary | ICD-10-CM | POA: Diagnosis not present

## 2022-10-23 DIAGNOSIS — Z23 Encounter for immunization: Secondary | ICD-10-CM | POA: Diagnosis not present

## 2022-10-23 DIAGNOSIS — Z992 Dependence on renal dialysis: Secondary | ICD-10-CM | POA: Diagnosis not present

## 2022-10-24 DIAGNOSIS — Z23 Encounter for immunization: Secondary | ICD-10-CM | POA: Diagnosis not present

## 2022-10-24 DIAGNOSIS — Z992 Dependence on renal dialysis: Secondary | ICD-10-CM | POA: Diagnosis not present

## 2022-10-24 DIAGNOSIS — N186 End stage renal disease: Secondary | ICD-10-CM | POA: Diagnosis not present

## 2022-10-25 DIAGNOSIS — Z992 Dependence on renal dialysis: Secondary | ICD-10-CM | POA: Diagnosis not present

## 2022-10-25 DIAGNOSIS — N186 End stage renal disease: Secondary | ICD-10-CM | POA: Diagnosis not present

## 2022-10-25 DIAGNOSIS — Z23 Encounter for immunization: Secondary | ICD-10-CM | POA: Diagnosis not present

## 2022-10-26 DIAGNOSIS — Z992 Dependence on renal dialysis: Secondary | ICD-10-CM | POA: Diagnosis not present

## 2022-10-26 DIAGNOSIS — N186 End stage renal disease: Secondary | ICD-10-CM | POA: Diagnosis not present

## 2022-10-27 DIAGNOSIS — N186 End stage renal disease: Secondary | ICD-10-CM | POA: Diagnosis not present

## 2022-10-27 DIAGNOSIS — Z992 Dependence on renal dialysis: Secondary | ICD-10-CM | POA: Diagnosis not present

## 2022-10-28 DIAGNOSIS — N186 End stage renal disease: Secondary | ICD-10-CM | POA: Diagnosis not present

## 2022-10-28 DIAGNOSIS — Z992 Dependence on renal dialysis: Secondary | ICD-10-CM | POA: Diagnosis not present

## 2022-10-29 DIAGNOSIS — Z992 Dependence on renal dialysis: Secondary | ICD-10-CM | POA: Diagnosis not present

## 2022-10-29 DIAGNOSIS — N186 End stage renal disease: Secondary | ICD-10-CM | POA: Diagnosis not present

## 2022-10-30 DIAGNOSIS — D649 Anemia, unspecified: Secondary | ICD-10-CM | POA: Diagnosis not present

## 2022-10-30 DIAGNOSIS — N186 End stage renal disease: Secondary | ICD-10-CM | POA: Diagnosis not present

## 2022-10-30 DIAGNOSIS — N250 Renal osteodystrophy: Secondary | ICD-10-CM | POA: Diagnosis not present

## 2022-10-30 DIAGNOSIS — Z992 Dependence on renal dialysis: Secondary | ICD-10-CM | POA: Diagnosis not present

## 2022-10-31 DIAGNOSIS — Z992 Dependence on renal dialysis: Secondary | ICD-10-CM | POA: Diagnosis not present

## 2022-10-31 DIAGNOSIS — N186 End stage renal disease: Secondary | ICD-10-CM | POA: Diagnosis not present

## 2022-11-01 DIAGNOSIS — Z992 Dependence on renal dialysis: Secondary | ICD-10-CM | POA: Diagnosis not present

## 2022-11-01 DIAGNOSIS — N186 End stage renal disease: Secondary | ICD-10-CM | POA: Diagnosis not present

## 2022-11-02 DIAGNOSIS — Z992 Dependence on renal dialysis: Secondary | ICD-10-CM | POA: Diagnosis not present

## 2022-11-02 DIAGNOSIS — N186 End stage renal disease: Secondary | ICD-10-CM | POA: Diagnosis not present

## 2022-11-03 DIAGNOSIS — N186 End stage renal disease: Secondary | ICD-10-CM | POA: Diagnosis not present

## 2022-11-03 DIAGNOSIS — Z992 Dependence on renal dialysis: Secondary | ICD-10-CM | POA: Diagnosis not present

## 2022-11-04 DIAGNOSIS — Z992 Dependence on renal dialysis: Secondary | ICD-10-CM | POA: Diagnosis not present

## 2022-11-04 DIAGNOSIS — N186 End stage renal disease: Secondary | ICD-10-CM | POA: Diagnosis not present

## 2022-11-05 DIAGNOSIS — Z992 Dependence on renal dialysis: Secondary | ICD-10-CM | POA: Diagnosis not present

## 2022-11-05 DIAGNOSIS — N186 End stage renal disease: Secondary | ICD-10-CM | POA: Diagnosis not present

## 2022-11-06 ENCOUNTER — Ambulatory Visit: Payer: Medicare Other | Admitting: Podiatry

## 2022-11-06 DIAGNOSIS — Z992 Dependence on renal dialysis: Secondary | ICD-10-CM | POA: Diagnosis not present

## 2022-11-06 DIAGNOSIS — N186 End stage renal disease: Secondary | ICD-10-CM | POA: Diagnosis not present

## 2022-11-07 DIAGNOSIS — N186 End stage renal disease: Secondary | ICD-10-CM | POA: Diagnosis not present

## 2022-11-07 DIAGNOSIS — Z992 Dependence on renal dialysis: Secondary | ICD-10-CM | POA: Diagnosis not present

## 2022-11-08 DIAGNOSIS — N186 End stage renal disease: Secondary | ICD-10-CM | POA: Diagnosis not present

## 2022-11-08 DIAGNOSIS — Z992 Dependence on renal dialysis: Secondary | ICD-10-CM | POA: Diagnosis not present

## 2022-11-09 DIAGNOSIS — Z992 Dependence on renal dialysis: Secondary | ICD-10-CM | POA: Diagnosis not present

## 2022-11-09 DIAGNOSIS — N186 End stage renal disease: Secondary | ICD-10-CM | POA: Diagnosis not present

## 2022-11-10 DIAGNOSIS — N186 End stage renal disease: Secondary | ICD-10-CM | POA: Diagnosis not present

## 2022-11-10 DIAGNOSIS — Z992 Dependence on renal dialysis: Secondary | ICD-10-CM | POA: Diagnosis not present

## 2022-11-11 ENCOUNTER — Ambulatory Visit: Payer: Medicare Other | Admitting: Podiatry

## 2022-11-11 DIAGNOSIS — K080 Exfoliation of teeth due to systemic causes: Secondary | ICD-10-CM | POA: Diagnosis not present

## 2022-11-11 DIAGNOSIS — Z992 Dependence on renal dialysis: Secondary | ICD-10-CM | POA: Diagnosis not present

## 2022-11-11 DIAGNOSIS — N186 End stage renal disease: Secondary | ICD-10-CM | POA: Diagnosis not present

## 2022-11-12 DIAGNOSIS — N186 End stage renal disease: Secondary | ICD-10-CM | POA: Diagnosis not present

## 2022-11-12 DIAGNOSIS — Z992 Dependence on renal dialysis: Secondary | ICD-10-CM | POA: Diagnosis not present

## 2022-11-13 ENCOUNTER — Other Ambulatory Visit: Payer: Self-pay | Admitting: Cardiovascular Disease

## 2022-11-13 ENCOUNTER — Encounter: Payer: Self-pay | Admitting: Podiatry

## 2022-11-13 ENCOUNTER — Ambulatory Visit: Payer: Medicare Other | Admitting: Podiatry

## 2022-11-13 DIAGNOSIS — N186 End stage renal disease: Secondary | ICD-10-CM | POA: Diagnosis not present

## 2022-11-13 DIAGNOSIS — E1142 Type 2 diabetes mellitus with diabetic polyneuropathy: Secondary | ICD-10-CM | POA: Diagnosis not present

## 2022-11-13 DIAGNOSIS — Z794 Long term (current) use of insulin: Secondary | ICD-10-CM | POA: Diagnosis not present

## 2022-11-13 DIAGNOSIS — M2041 Other hammer toe(s) (acquired), right foot: Secondary | ICD-10-CM

## 2022-11-13 DIAGNOSIS — M2042 Other hammer toe(s) (acquired), left foot: Secondary | ICD-10-CM

## 2022-11-13 DIAGNOSIS — Z992 Dependence on renal dialysis: Secondary | ICD-10-CM | POA: Diagnosis not present

## 2022-11-13 NOTE — Progress Notes (Signed)
He presents today for diabetic foot exam states that his last latest A1c was 7.9 he does have neuropathy he does not want nail trim today states that he gets these blood blisters occasionally and he has dry skin.  Objective: Vital signs are stable alert and oriented x 3 there is no erythema edema salines drainage of the pulses are minimally palpable.  He has no open lesions or wounds that he does have a eschar in the proximal nail fold measures approximately 0.8 cm in diameter.  This appears to be stable at this time.  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  Plan: Follow-up with me in 3 months

## 2022-11-14 DIAGNOSIS — N186 End stage renal disease: Secondary | ICD-10-CM | POA: Diagnosis not present

## 2022-11-14 DIAGNOSIS — Z992 Dependence on renal dialysis: Secondary | ICD-10-CM | POA: Diagnosis not present

## 2022-11-15 DIAGNOSIS — Z992 Dependence on renal dialysis: Secondary | ICD-10-CM | POA: Diagnosis not present

## 2022-11-15 DIAGNOSIS — N186 End stage renal disease: Secondary | ICD-10-CM | POA: Diagnosis not present

## 2022-11-16 DIAGNOSIS — Z992 Dependence on renal dialysis: Secondary | ICD-10-CM | POA: Diagnosis not present

## 2022-11-16 DIAGNOSIS — N186 End stage renal disease: Secondary | ICD-10-CM | POA: Diagnosis not present

## 2022-11-17 DIAGNOSIS — N186 End stage renal disease: Secondary | ICD-10-CM | POA: Diagnosis not present

## 2022-11-17 DIAGNOSIS — Z992 Dependence on renal dialysis: Secondary | ICD-10-CM | POA: Diagnosis not present

## 2022-11-18 DIAGNOSIS — N186 End stage renal disease: Secondary | ICD-10-CM | POA: Diagnosis not present

## 2022-11-18 DIAGNOSIS — Z992 Dependence on renal dialysis: Secondary | ICD-10-CM | POA: Diagnosis not present

## 2022-11-19 DIAGNOSIS — N186 End stage renal disease: Secondary | ICD-10-CM | POA: Diagnosis not present

## 2022-11-19 DIAGNOSIS — Z992 Dependence on renal dialysis: Secondary | ICD-10-CM | POA: Diagnosis not present

## 2022-11-20 DIAGNOSIS — Z992 Dependence on renal dialysis: Secondary | ICD-10-CM | POA: Diagnosis not present

## 2022-11-20 DIAGNOSIS — Z794 Long term (current) use of insulin: Secondary | ICD-10-CM | POA: Diagnosis not present

## 2022-11-20 DIAGNOSIS — N186 End stage renal disease: Secondary | ICD-10-CM | POA: Diagnosis not present

## 2022-11-20 DIAGNOSIS — E11319 Type 2 diabetes mellitus with unspecified diabetic retinopathy without macular edema: Secondary | ICD-10-CM | POA: Diagnosis not present

## 2022-11-20 DIAGNOSIS — E1122 Type 2 diabetes mellitus with diabetic chronic kidney disease: Secondary | ICD-10-CM | POA: Diagnosis not present

## 2022-11-20 DIAGNOSIS — E1140 Type 2 diabetes mellitus with diabetic neuropathy, unspecified: Secondary | ICD-10-CM | POA: Diagnosis not present

## 2022-11-20 DIAGNOSIS — E785 Hyperlipidemia, unspecified: Secondary | ICD-10-CM | POA: Diagnosis not present

## 2022-11-20 DIAGNOSIS — E1169 Type 2 diabetes mellitus with other specified complication: Secondary | ICD-10-CM | POA: Diagnosis not present

## 2022-11-21 DIAGNOSIS — Z992 Dependence on renal dialysis: Secondary | ICD-10-CM | POA: Diagnosis not present

## 2022-11-21 DIAGNOSIS — N186 End stage renal disease: Secondary | ICD-10-CM | POA: Diagnosis not present

## 2022-11-21 DIAGNOSIS — E118 Type 2 diabetes mellitus with unspecified complications: Secondary | ICD-10-CM | POA: Diagnosis not present

## 2022-11-21 DIAGNOSIS — Z794 Long term (current) use of insulin: Secondary | ICD-10-CM | POA: Diagnosis not present

## 2022-11-22 DIAGNOSIS — Z992 Dependence on renal dialysis: Secondary | ICD-10-CM | POA: Diagnosis not present

## 2022-11-22 DIAGNOSIS — N186 End stage renal disease: Secondary | ICD-10-CM | POA: Diagnosis not present

## 2022-11-23 DIAGNOSIS — N186 End stage renal disease: Secondary | ICD-10-CM | POA: Diagnosis not present

## 2022-11-23 DIAGNOSIS — Z992 Dependence on renal dialysis: Secondary | ICD-10-CM | POA: Diagnosis not present

## 2022-11-24 DIAGNOSIS — N186 End stage renal disease: Secondary | ICD-10-CM | POA: Diagnosis not present

## 2022-11-24 DIAGNOSIS — Z992 Dependence on renal dialysis: Secondary | ICD-10-CM | POA: Diagnosis not present

## 2022-11-25 DIAGNOSIS — Z992 Dependence on renal dialysis: Secondary | ICD-10-CM | POA: Diagnosis not present

## 2022-11-25 DIAGNOSIS — N186 End stage renal disease: Secondary | ICD-10-CM | POA: Diagnosis not present

## 2022-11-25 DIAGNOSIS — E113413 Type 2 diabetes mellitus with severe nonproliferative diabetic retinopathy with macular edema, bilateral: Secondary | ICD-10-CM | POA: Diagnosis not present

## 2022-11-26 DIAGNOSIS — N186 End stage renal disease: Secondary | ICD-10-CM | POA: Diagnosis not present

## 2022-11-26 DIAGNOSIS — Z992 Dependence on renal dialysis: Secondary | ICD-10-CM | POA: Diagnosis not present

## 2022-11-27 DIAGNOSIS — N186 End stage renal disease: Secondary | ICD-10-CM | POA: Diagnosis not present

## 2022-11-27 DIAGNOSIS — Z992 Dependence on renal dialysis: Secondary | ICD-10-CM | POA: Diagnosis not present

## 2022-11-28 DIAGNOSIS — N186 End stage renal disease: Secondary | ICD-10-CM | POA: Diagnosis not present

## 2022-11-28 DIAGNOSIS — Z992 Dependence on renal dialysis: Secondary | ICD-10-CM | POA: Diagnosis not present

## 2022-11-29 DIAGNOSIS — N186 End stage renal disease: Secondary | ICD-10-CM | POA: Diagnosis not present

## 2022-11-29 DIAGNOSIS — Z992 Dependence on renal dialysis: Secondary | ICD-10-CM | POA: Diagnosis not present

## 2022-11-30 DIAGNOSIS — N186 End stage renal disease: Secondary | ICD-10-CM | POA: Diagnosis not present

## 2022-11-30 DIAGNOSIS — Z992 Dependence on renal dialysis: Secondary | ICD-10-CM | POA: Diagnosis not present

## 2022-12-01 DIAGNOSIS — N186 End stage renal disease: Secondary | ICD-10-CM | POA: Diagnosis not present

## 2022-12-01 DIAGNOSIS — Z992 Dependence on renal dialysis: Secondary | ICD-10-CM | POA: Diagnosis not present

## 2022-12-02 DIAGNOSIS — N186 End stage renal disease: Secondary | ICD-10-CM | POA: Diagnosis not present

## 2022-12-02 DIAGNOSIS — Z992 Dependence on renal dialysis: Secondary | ICD-10-CM | POA: Diagnosis not present

## 2022-12-03 DIAGNOSIS — E559 Vitamin D deficiency, unspecified: Secondary | ICD-10-CM | POA: Diagnosis not present

## 2022-12-03 DIAGNOSIS — Z992 Dependence on renal dialysis: Secondary | ICD-10-CM | POA: Diagnosis not present

## 2022-12-03 DIAGNOSIS — E109 Type 1 diabetes mellitus without complications: Secondary | ICD-10-CM | POA: Diagnosis not present

## 2022-12-03 DIAGNOSIS — E785 Hyperlipidemia, unspecified: Secondary | ICD-10-CM | POA: Diagnosis not present

## 2022-12-03 DIAGNOSIS — N186 End stage renal disease: Secondary | ICD-10-CM | POA: Diagnosis not present

## 2022-12-03 DIAGNOSIS — Z79899 Other long term (current) drug therapy: Secondary | ICD-10-CM | POA: Diagnosis not present

## 2022-12-03 DIAGNOSIS — D631 Anemia in chronic kidney disease: Secondary | ICD-10-CM | POA: Diagnosis not present

## 2022-12-03 DIAGNOSIS — D649 Anemia, unspecified: Secondary | ICD-10-CM | POA: Diagnosis not present

## 2022-12-04 DIAGNOSIS — N186 End stage renal disease: Secondary | ICD-10-CM | POA: Diagnosis not present

## 2022-12-04 DIAGNOSIS — Z992 Dependence on renal dialysis: Secondary | ICD-10-CM | POA: Diagnosis not present

## 2022-12-05 DIAGNOSIS — N186 End stage renal disease: Secondary | ICD-10-CM | POA: Diagnosis not present

## 2022-12-05 DIAGNOSIS — Z992 Dependence on renal dialysis: Secondary | ICD-10-CM | POA: Diagnosis not present

## 2022-12-06 DIAGNOSIS — N186 End stage renal disease: Secondary | ICD-10-CM | POA: Diagnosis not present

## 2022-12-06 DIAGNOSIS — Z992 Dependence on renal dialysis: Secondary | ICD-10-CM | POA: Diagnosis not present

## 2022-12-07 DIAGNOSIS — N186 End stage renal disease: Secondary | ICD-10-CM | POA: Diagnosis not present

## 2022-12-07 DIAGNOSIS — Z992 Dependence on renal dialysis: Secondary | ICD-10-CM | POA: Diagnosis not present

## 2022-12-08 DIAGNOSIS — N186 End stage renal disease: Secondary | ICD-10-CM | POA: Diagnosis not present

## 2022-12-08 DIAGNOSIS — Z992 Dependence on renal dialysis: Secondary | ICD-10-CM | POA: Diagnosis not present

## 2022-12-09 DIAGNOSIS — N186 End stage renal disease: Secondary | ICD-10-CM | POA: Diagnosis not present

## 2022-12-09 DIAGNOSIS — Z992 Dependence on renal dialysis: Secondary | ICD-10-CM | POA: Diagnosis not present

## 2022-12-10 DIAGNOSIS — Z992 Dependence on renal dialysis: Secondary | ICD-10-CM | POA: Diagnosis not present

## 2022-12-10 DIAGNOSIS — N186 End stage renal disease: Secondary | ICD-10-CM | POA: Diagnosis not present

## 2022-12-11 DIAGNOSIS — N186 End stage renal disease: Secondary | ICD-10-CM | POA: Diagnosis not present

## 2022-12-11 DIAGNOSIS — Z992 Dependence on renal dialysis: Secondary | ICD-10-CM | POA: Diagnosis not present

## 2022-12-12 DIAGNOSIS — Z992 Dependence on renal dialysis: Secondary | ICD-10-CM | POA: Diagnosis not present

## 2022-12-12 DIAGNOSIS — N186 End stage renal disease: Secondary | ICD-10-CM | POA: Diagnosis not present

## 2022-12-13 DIAGNOSIS — N186 End stage renal disease: Secondary | ICD-10-CM | POA: Diagnosis not present

## 2022-12-13 DIAGNOSIS — Z992 Dependence on renal dialysis: Secondary | ICD-10-CM | POA: Diagnosis not present

## 2022-12-14 DIAGNOSIS — Z992 Dependence on renal dialysis: Secondary | ICD-10-CM | POA: Diagnosis not present

## 2022-12-14 DIAGNOSIS — N186 End stage renal disease: Secondary | ICD-10-CM | POA: Diagnosis not present

## 2022-12-15 DIAGNOSIS — Z992 Dependence on renal dialysis: Secondary | ICD-10-CM | POA: Diagnosis not present

## 2022-12-15 DIAGNOSIS — N186 End stage renal disease: Secondary | ICD-10-CM | POA: Diagnosis not present

## 2022-12-16 DIAGNOSIS — Z992 Dependence on renal dialysis: Secondary | ICD-10-CM | POA: Diagnosis not present

## 2022-12-16 DIAGNOSIS — N186 End stage renal disease: Secondary | ICD-10-CM | POA: Diagnosis not present

## 2022-12-17 DIAGNOSIS — N186 End stage renal disease: Secondary | ICD-10-CM | POA: Diagnosis not present

## 2022-12-17 DIAGNOSIS — Z992 Dependence on renal dialysis: Secondary | ICD-10-CM | POA: Diagnosis not present

## 2022-12-18 DIAGNOSIS — Z992 Dependence on renal dialysis: Secondary | ICD-10-CM | POA: Diagnosis not present

## 2022-12-18 DIAGNOSIS — N186 End stage renal disease: Secondary | ICD-10-CM | POA: Diagnosis not present

## 2022-12-19 DIAGNOSIS — N186 End stage renal disease: Secondary | ICD-10-CM | POA: Diagnosis not present

## 2022-12-19 DIAGNOSIS — Z992 Dependence on renal dialysis: Secondary | ICD-10-CM | POA: Diagnosis not present

## 2022-12-20 DIAGNOSIS — N186 End stage renal disease: Secondary | ICD-10-CM | POA: Diagnosis not present

## 2022-12-20 DIAGNOSIS — Z992 Dependence on renal dialysis: Secondary | ICD-10-CM | POA: Diagnosis not present

## 2022-12-21 DIAGNOSIS — E118 Type 2 diabetes mellitus with unspecified complications: Secondary | ICD-10-CM | POA: Diagnosis not present

## 2022-12-21 DIAGNOSIS — N186 End stage renal disease: Secondary | ICD-10-CM | POA: Diagnosis not present

## 2022-12-21 DIAGNOSIS — Z794 Long term (current) use of insulin: Secondary | ICD-10-CM | POA: Diagnosis not present

## 2022-12-21 DIAGNOSIS — Z992 Dependence on renal dialysis: Secondary | ICD-10-CM | POA: Diagnosis not present

## 2022-12-22 DIAGNOSIS — Z992 Dependence on renal dialysis: Secondary | ICD-10-CM | POA: Diagnosis not present

## 2022-12-22 DIAGNOSIS — N186 End stage renal disease: Secondary | ICD-10-CM | POA: Diagnosis not present

## 2022-12-23 DIAGNOSIS — Z992 Dependence on renal dialysis: Secondary | ICD-10-CM | POA: Diagnosis not present

## 2022-12-23 DIAGNOSIS — N186 End stage renal disease: Secondary | ICD-10-CM | POA: Diagnosis not present

## 2022-12-24 DIAGNOSIS — N186 End stage renal disease: Secondary | ICD-10-CM | POA: Diagnosis not present

## 2022-12-24 DIAGNOSIS — Z992 Dependence on renal dialysis: Secondary | ICD-10-CM | POA: Diagnosis not present

## 2022-12-25 DIAGNOSIS — Z992 Dependence on renal dialysis: Secondary | ICD-10-CM | POA: Diagnosis not present

## 2022-12-25 DIAGNOSIS — N186 End stage renal disease: Secondary | ICD-10-CM | POA: Diagnosis not present

## 2022-12-26 DIAGNOSIS — Z992 Dependence on renal dialysis: Secondary | ICD-10-CM | POA: Diagnosis not present

## 2022-12-26 DIAGNOSIS — N186 End stage renal disease: Secondary | ICD-10-CM | POA: Diagnosis not present

## 2022-12-27 DIAGNOSIS — N186 End stage renal disease: Secondary | ICD-10-CM | POA: Diagnosis not present

## 2022-12-27 DIAGNOSIS — Z992 Dependence on renal dialysis: Secondary | ICD-10-CM | POA: Diagnosis not present

## 2022-12-28 DIAGNOSIS — Z992 Dependence on renal dialysis: Secondary | ICD-10-CM | POA: Diagnosis not present

## 2022-12-28 DIAGNOSIS — N186 End stage renal disease: Secondary | ICD-10-CM | POA: Diagnosis not present

## 2022-12-29 DIAGNOSIS — Z992 Dependence on renal dialysis: Secondary | ICD-10-CM | POA: Diagnosis not present

## 2022-12-29 DIAGNOSIS — N186 End stage renal disease: Secondary | ICD-10-CM | POA: Diagnosis not present

## 2022-12-30 DIAGNOSIS — Z992 Dependence on renal dialysis: Secondary | ICD-10-CM | POA: Diagnosis not present

## 2022-12-30 DIAGNOSIS — N186 End stage renal disease: Secondary | ICD-10-CM | POA: Diagnosis not present

## 2022-12-31 DIAGNOSIS — N186 End stage renal disease: Secondary | ICD-10-CM | POA: Diagnosis not present

## 2022-12-31 DIAGNOSIS — Z992 Dependence on renal dialysis: Secondary | ICD-10-CM | POA: Diagnosis not present

## 2023-01-01 DIAGNOSIS — N186 End stage renal disease: Secondary | ICD-10-CM | POA: Diagnosis not present

## 2023-01-01 DIAGNOSIS — Z992 Dependence on renal dialysis: Secondary | ICD-10-CM | POA: Diagnosis not present

## 2023-01-02 DIAGNOSIS — Z992 Dependence on renal dialysis: Secondary | ICD-10-CM | POA: Diagnosis not present

## 2023-01-02 DIAGNOSIS — N186 End stage renal disease: Secondary | ICD-10-CM | POA: Diagnosis not present

## 2023-01-03 DIAGNOSIS — Z992 Dependence on renal dialysis: Secondary | ICD-10-CM | POA: Diagnosis not present

## 2023-01-03 DIAGNOSIS — N186 End stage renal disease: Secondary | ICD-10-CM | POA: Diagnosis not present

## 2023-01-04 DIAGNOSIS — N186 End stage renal disease: Secondary | ICD-10-CM | POA: Diagnosis not present

## 2023-01-04 DIAGNOSIS — Z992 Dependence on renal dialysis: Secondary | ICD-10-CM | POA: Diagnosis not present

## 2023-01-05 DIAGNOSIS — N186 End stage renal disease: Secondary | ICD-10-CM | POA: Diagnosis not present

## 2023-01-05 DIAGNOSIS — Z992 Dependence on renal dialysis: Secondary | ICD-10-CM | POA: Diagnosis not present

## 2023-01-06 DIAGNOSIS — Z992 Dependence on renal dialysis: Secondary | ICD-10-CM | POA: Diagnosis not present

## 2023-01-06 DIAGNOSIS — N186 End stage renal disease: Secondary | ICD-10-CM | POA: Diagnosis not present

## 2023-01-07 DIAGNOSIS — N250 Renal osteodystrophy: Secondary | ICD-10-CM | POA: Diagnosis not present

## 2023-01-07 DIAGNOSIS — N186 End stage renal disease: Secondary | ICD-10-CM | POA: Diagnosis not present

## 2023-01-07 DIAGNOSIS — Z992 Dependence on renal dialysis: Secondary | ICD-10-CM | POA: Diagnosis not present

## 2023-01-07 DIAGNOSIS — D649 Anemia, unspecified: Secondary | ICD-10-CM | POA: Diagnosis not present

## 2023-01-08 DIAGNOSIS — N186 End stage renal disease: Secondary | ICD-10-CM | POA: Diagnosis not present

## 2023-01-08 DIAGNOSIS — Z992 Dependence on renal dialysis: Secondary | ICD-10-CM | POA: Diagnosis not present

## 2023-01-09 DIAGNOSIS — Z992 Dependence on renal dialysis: Secondary | ICD-10-CM | POA: Diagnosis not present

## 2023-01-09 DIAGNOSIS — N186 End stage renal disease: Secondary | ICD-10-CM | POA: Diagnosis not present

## 2023-01-11 DIAGNOSIS — Z992 Dependence on renal dialysis: Secondary | ICD-10-CM | POA: Diagnosis not present

## 2023-01-11 DIAGNOSIS — N186 End stage renal disease: Secondary | ICD-10-CM | POA: Diagnosis not present

## 2023-01-12 DIAGNOSIS — N186 End stage renal disease: Secondary | ICD-10-CM | POA: Diagnosis not present

## 2023-01-12 DIAGNOSIS — Z992 Dependence on renal dialysis: Secondary | ICD-10-CM | POA: Diagnosis not present

## 2023-01-13 DIAGNOSIS — N186 End stage renal disease: Secondary | ICD-10-CM | POA: Diagnosis not present

## 2023-01-13 DIAGNOSIS — Z992 Dependence on renal dialysis: Secondary | ICD-10-CM | POA: Diagnosis not present

## 2023-01-14 DIAGNOSIS — N186 End stage renal disease: Secondary | ICD-10-CM | POA: Diagnosis not present

## 2023-01-14 DIAGNOSIS — Z992 Dependence on renal dialysis: Secondary | ICD-10-CM | POA: Diagnosis not present

## 2023-01-15 DIAGNOSIS — Z992 Dependence on renal dialysis: Secondary | ICD-10-CM | POA: Diagnosis not present

## 2023-01-15 DIAGNOSIS — N186 End stage renal disease: Secondary | ICD-10-CM | POA: Diagnosis not present

## 2023-01-16 DIAGNOSIS — Z992 Dependence on renal dialysis: Secondary | ICD-10-CM | POA: Diagnosis not present

## 2023-01-16 DIAGNOSIS — N186 End stage renal disease: Secondary | ICD-10-CM | POA: Diagnosis not present

## 2023-01-17 DIAGNOSIS — N186 End stage renal disease: Secondary | ICD-10-CM | POA: Diagnosis not present

## 2023-01-17 DIAGNOSIS — Z992 Dependence on renal dialysis: Secondary | ICD-10-CM | POA: Diagnosis not present

## 2023-01-18 DIAGNOSIS — N186 End stage renal disease: Secondary | ICD-10-CM | POA: Diagnosis not present

## 2023-01-18 DIAGNOSIS — Z992 Dependence on renal dialysis: Secondary | ICD-10-CM | POA: Diagnosis not present

## 2023-01-19 DIAGNOSIS — N186 End stage renal disease: Secondary | ICD-10-CM | POA: Diagnosis not present

## 2023-01-19 DIAGNOSIS — Z992 Dependence on renal dialysis: Secondary | ICD-10-CM | POA: Diagnosis not present

## 2023-01-20 DIAGNOSIS — E118 Type 2 diabetes mellitus with unspecified complications: Secondary | ICD-10-CM | POA: Diagnosis not present

## 2023-01-20 DIAGNOSIS — Z794 Long term (current) use of insulin: Secondary | ICD-10-CM | POA: Diagnosis not present

## 2023-01-20 DIAGNOSIS — Z992 Dependence on renal dialysis: Secondary | ICD-10-CM | POA: Diagnosis not present

## 2023-01-20 DIAGNOSIS — N186 End stage renal disease: Secondary | ICD-10-CM | POA: Diagnosis not present

## 2023-01-21 DIAGNOSIS — E113413 Type 2 diabetes mellitus with severe nonproliferative diabetic retinopathy with macular edema, bilateral: Secondary | ICD-10-CM | POA: Diagnosis not present

## 2023-01-21 DIAGNOSIS — N186 End stage renal disease: Secondary | ICD-10-CM | POA: Diagnosis not present

## 2023-01-21 DIAGNOSIS — Z992 Dependence on renal dialysis: Secondary | ICD-10-CM | POA: Diagnosis not present

## 2023-01-22 ENCOUNTER — Ambulatory Visit (INDEPENDENT_AMBULATORY_CARE_PROVIDER_SITE_OTHER): Payer: Medicare Other | Admitting: Nurse Practitioner

## 2023-01-22 ENCOUNTER — Encounter: Payer: Self-pay | Admitting: Nurse Practitioner

## 2023-01-22 VITALS — BP 138/80 | HR 61 | Temp 98.1°F | Resp 16 | Ht 74.0 in | Wt 189.0 lb

## 2023-01-22 DIAGNOSIS — I252 Old myocardial infarction: Secondary | ICD-10-CM

## 2023-01-22 DIAGNOSIS — I2510 Atherosclerotic heart disease of native coronary artery without angina pectoris: Secondary | ICD-10-CM

## 2023-01-22 DIAGNOSIS — Z0000 Encounter for general adult medical examination without abnormal findings: Secondary | ICD-10-CM

## 2023-01-22 DIAGNOSIS — I700 Atherosclerosis of aorta: Secondary | ICD-10-CM

## 2023-01-22 DIAGNOSIS — Z992 Dependence on renal dialysis: Secondary | ICD-10-CM | POA: Diagnosis not present

## 2023-01-22 DIAGNOSIS — N186 End stage renal disease: Secondary | ICD-10-CM | POA: Diagnosis not present

## 2023-01-22 DIAGNOSIS — E1122 Type 2 diabetes mellitus with diabetic chronic kidney disease: Secondary | ICD-10-CM | POA: Diagnosis not present

## 2023-01-22 DIAGNOSIS — Z794 Long term (current) use of insulin: Secondary | ICD-10-CM

## 2023-01-22 DIAGNOSIS — I739 Peripheral vascular disease, unspecified: Secondary | ICD-10-CM

## 2023-01-22 DIAGNOSIS — I5022 Chronic systolic (congestive) heart failure: Secondary | ICD-10-CM | POA: Diagnosis not present

## 2023-01-22 DIAGNOSIS — K2100 Gastro-esophageal reflux disease with esophagitis, without bleeding: Secondary | ICD-10-CM

## 2023-01-22 DIAGNOSIS — N185 Chronic kidney disease, stage 5: Secondary | ICD-10-CM

## 2023-01-22 DIAGNOSIS — I120 Hypertensive chronic kidney disease with stage 5 chronic kidney disease or end stage renal disease: Secondary | ICD-10-CM

## 2023-01-22 NOTE — Progress Notes (Unsigned)
[facility] [address] [place], [place] [address]  Internal MEDICINE  Office Visit Note  Patient Name: [name] [name]  [id number]  [id number]  Date of Service: 01/22/2023  Chief Complaint  Patient presents with  . Diabetes  . Gastroesophageal Reflux  . Hypertension  . Hyperlipidemia    HPI [name] [name] presents for an annual well visit and physical exam.  Well-appearing 62 y.o. male with  Routine CRC screening: Labs:  New or worsening pain: Other concerns:      01/22/2023    2:55 PM  MMSE - Mini Mental State Exam  Orientation to time 5  Orientation to Place 5  Registration 3  Attention/ Calculation 0  Recall 3  Language- name 2 objects 2  Language- repeat 1  Language- follow 3 step command 3  Language- read & follow direction 1  Write a sentence 0  Copy design 1  Total score 24    Functional Status Survey: Is the patient deaf or have difficulty hearing?: No Does the patient have difficulty seeing, even when wearing glasses/contacts?: No Does the patient have difficulty concentrating, remembering, or making decisions?: Yes Does the patient have difficulty walking or climbing stairs?: Yes Does the patient have difficulty dressing or bathing?: No Does the patient have difficulty doing errands alone such as visiting a doctor's office or shopping?: Yes     01/04/2022   10:06 AM 01/10/2022   12:36 PM 01/16/2022    3:02 PM 07/17/2022    1:17 PM 01/22/2023    2:54 PM  Fall Risk  Falls in the past year? 0  0 0 0  Was there an injury with Fall?    0 0  Fall Risk Category Calculator    0 0  (RETIRED) Patient Fall Risk Level  High fall risk     Patient at Risk for Falls Due to    No Fall Risks No Fall Risks  Fall risk Follow up    Falls evaluation completed Falls evaluation completed       01/22/2023    2:54 PM  Depression screen PHQ 2/9  Decreased Interest 0  Down, Depressed, Hopeless 0  PHQ - 2 Score 0       05/15/2021   10:41 AM 02/21/2021     2:27 PM  GAD 7 : Generalized Anxiety Score  Nervous, Anxious, on Edge 0 0  Control/stop worrying 0 0  Worry too much - different things 0 0  Trouble relaxing 0 0  Restless 0 0  Easily annoyed or irritable 0 0  Afraid - awful might happen 0 0  Total GAD 7 Score 0 0  Anxiety Difficulty Not difficult at all Not difficult at all      Current Medication: Outpatient Encounter Medications as of 01/22/2023  Medication Sig Note  . aspirin EC 81 MG tablet Take 81 mg by mouth daily. Swallow whole.   . carvedilol (COREG) 12.5 MG tablet TAKE 1 AND 1/2 TABLETS BY MOUTH TWICE DAILY   . cholecalciferol (VITAMIN D3) 25 MCG (1000 UNIT) tablet Take 2,000 Units by mouth 2 (two) times daily.   Marland Kitchen docusate calcium (SURFAK) 240 MG capsule Take 240 mg by mouth daily.   . Evolocumab (REPATHA SURECLICK) 140 MG/ML SOAJ INJECT 140 MG UNDER THE SKIN EVERY 14 DAYS   . ezetimibe (ZETIA) 10 MG tablet TAKE 1 TABLET(10 MG) BY MOUTH DAILY   . insulin lispro (HUMALOG) 100 UNIT/ML KwikPen Inject into the skin.   . Insulin Pen Needle (B-D  ULTRAFINE III SHORT PEN) 31G X 8 MM MISC To use with pen basal and mealtime coverage insulin pens 5 times daily. DX: E11.65   . isosorbide mononitrate (IMDUR) 30 MG 24 hr tablet Take 1 tablet (30 mg total) by mouth 2 (two) times daily.   Marland Kitchen levofloxacin (LEVAQUIN) 750 MG tablet SMARTSIG:1 Tablet(s) By Mouth   . losartan (COZAAR) 100 MG tablet Take by mouth.   . losartan (COZAAR) 25 MG tablet Take 25 mg by mouth every evening.   . nortriptyline (PAMELOR) 10 MG capsule Take 50 mg by mouth at bedtime.   Marland Kitchen NOVOLOG FLEXPEN 100 UNIT/ML FlexPen Inject into the skin 3 (three) times daily with meals. 3 x 's daily with meals - SSI   . pantoprazole (PROTONIX) 40 MG tablet Take 1 tablet (40 mg total) by mouth 2 (two) times daily before a meal.   . polyethylene glycol-electrolytes (NULYTELY) 420 g solution 5pm two nights before procedure Mix and drink 8oz every 20 mins until half of is gone rthen 5pm  the night before procedure drink 8oz every 20 mins until gone 5 hours morning of procedure mix and drink 8oz every 15 mins until 1/2 of 2nd jug is gone   . Ruxolitinib Phosphate (OPZELURA) 1.5 % CREA Apply to skin daily, legs (Patient taking differently: Apply 1 Application topically daily. Apply to skin daily, legs)   . torsemide (DEMADEX) 20 MG tablet Take 1 tablet (20 mg total) by mouth 2 (two) times daily.   [name] [name] MAX SOLOSTAR 300 UNIT/ML Solostar Pen SMARTSIG:80 Unit(s) SUB-Q Daily   . TRESIBA FLEXTOUCH 200 UNIT/ML FlexTouch Pen 100 Units at bedtime. 01/10/2022: 50 units last night   No facility-administered encounter medications on file as of 01/22/2023.    Surgical History: Past Surgical History:  Procedure Laterality Date  . bone graft surgery Bilateral    on both feet x 2  . CAPD INSERTION N/A 11/13/2021   Procedure: LAPAROSCOPIC INSERTION CONTINUOUS AMBULATORY PERITONEAL DIALYSIS  (CAPD) CATHETER;  Surgeon: Leafy Ro, MD;  Location: [facility] ORS;  Service: General;  Laterality: N/A;  . CAPD REVISION N/A 01/10/2022   Procedure: LAPAROSCOPIC REVISION CONTINUOUS AMBULATORY PERITONEAL DIALYSIS  (CAPD) CATHETER;  Surgeon: Leafy Ro, MD;  Location: [facility] ORS;  Service: General;  Laterality: N/A;  . CARDIAC CATHETERIZATION  2013   S/p PCI   . CARDIAC CATHETERIZATION  2018   S/p CABG  . CATARACT EXTRACTION Bilateral   . COLONOSCOPY    . COLONOSCOPY WITH PROPOFOL N/A 10/17/2022   Procedure: COLONOSCOPY WITH PROPOFOL;  Surgeon: [name], MD;  Location: [facility] ENDOSCOPY;  Service: Endoscopy;  Laterality: N/A;  . CORONARY ARTERY BYPASS GRAFT  2018   (LIMA-LAD,VG-RCA,VG-OM1,VG-D1)  . ESOPHAGOGASTRODUODENOSCOPY (EGD) WITH PROPOFOL N/A 04/04/2020   Procedure: ESOPHAGOGASTRODUODENOSCOPY (EGD) WITH PROPOFOL;  Surgeon: [name], MD;  Location: [facility] ENDOSCOPY;  Service: Endoscopy;  Laterality: N/A;  . ESOPHAGOGASTRODUODENOSCOPY (EGD) WITH PROPOFOL N/A 06/06/2020   Procedure:  ESOPHAGOGASTRODUODENOSCOPY (EGD) WITH PROPOFOL;  Surgeon: [name], MD;  Location: [facility] ENDOSCOPY;  Service: Endoscopy;  Laterality: N/A;  . ESOPHAGOGASTRODUODENOSCOPY (EGD) WITH PROPOFOL N/A 09/24/2022   Procedure: ESOPHAGOGASTRODUODENOSCOPY (EGD) WITH PROPOFOL;  Surgeon: [name], MD;  Location: [facility] ENDOSCOPY;  Service: Endoscopy;  Laterality: N/A;  . PERIPHERAL VASCULAR CATHETERIZATION Right 10/28/2016   PTA/DEB Right SFA  . WRIST SURGERY Left    cyst    Medical History: Past Medical History:  Diagnosis Date  . Anemia   . Arthritis   . CAD (coronary artery disease)  a. 12/2016 s/p CABG x 4 (LIMA->LAD, VG->RCA, VG->OM1, VG->D1); b. 02/2018 MV: EF 35%, small-med inferolaterlal infarct. No ischemia.  . CKD (chronic kidney disease), stage IV [facility])    Peritoneal Dialysis  . Colon polyps   . COPD (chronic obstructive pulmonary disease) (HCC)   . Dupre's syndrome   . GERD (gastroesophageal reflux disease)   . HFrEF (heart failure reduced ejection fraction) (HCC)    a.) 2018 EF 35%; b.) 02/2018 EF 50%; c). 02/2019 EF 40-45%; d.) 08/2019 Echo: EF 50-55%, no rwma, GrII DD; e.) 04/2020 Echo: EF 30-35%, mod-sev glob HK. Mild LVH. G2DD. Low-nl RV fxn. Mildly dil LA. Mild MR. Mild-mod Ao sclerosis w/o stenosis; f.) TTE 02/09/2021: EF 55%; septal HK, LAE; G2DD.  Marland Kitchen History of 2019 novel coronavirus disease (COVID-19) 02/08/2021  . Hyperlipidemia   . Hypertension   . Ischemic cardiomyopathy    a.) 02/2018 MV: EF 35%; b.) 08/2019 Echo: EF 50-55%; c.) 04/2020 Echo: EF 30-35%; d.) TTE 02/09/2021: EF 50-55%.  . Lymphedema   . Migraine   . Myocardial infarction (HCC)   . Neuropathy   . PAD (peripheral artery disease) (HCC)    a. 04/2017 Aortoiliac duplex: R iliac dzs; b. 03/2020 LE Duplex: mild bilat atherosclerosis throughout. Patent vessels.  . Pneumonia   . Retinopathy   . S/P CABG x 4    a.)  4v CABG: LIMA->LAD, SVG->RCA, SVG->OM1, SVG->D1  . Sleep apnea    a.) does not require  nocturnal PAP therapy  . Statin intolerance   . Stomach ulcer   . T2DM (type 2 diabetes mellitus) (HCC)     Family History: Family History  Problem Relation Age of Onset  . Diabetes Mother   . Heart disease Father     Social History   Socioeconomic History  . Marital status: Married    Spouse name: Roxanne  . Number of children: Not on file  . Years of education: Not on file  . Highest education level: Not on file  Occupational History  . Not on file  Tobacco Use  . Smoking status: Former    Current packs/day: 0.25    Average packs/day: 0.3 packs/day for 39.0 years (9.8 ttl pk-yrs)    Types: Cigarettes    Passive exposure: Past  . Smokeless tobacco: Never  Vaping Use  . Vaping status: Never Used  Substance and Sexual Activity  . Alcohol use: Not Currently    Comment: very rarely - wine  . Drug use: Never  . Sexual activity: Not on file  Other Topics Concern  . Not on file  Social History Narrative  . Not on file   Social Determinants of Health   Financial Resource Strain: Low Risk  (09/09/2021)   Received from [facility], [facility]   Overall Financial Resource Strain (CARDIA)   . Difficulty of Paying Living Expenses: Not hard at all  Food Insecurity: No Food Insecurity (09/09/2021)   Received from [facility], [facility]   Hunger Vital Sign   . Worried About Programme researcher, broadcasting/film/video in the Last Year: Never true   . Ran Out of Food in the Last Year: Never true  Transportation Needs: No Transportation Needs (04/11/2021)   Received from [facility], [facility]   [facility] - Transportation   . Lack of Transportation (Medical): No   . Lack of Transportation (Non-Medical): No  Physical Activity: Unknown (09/09/2021)   Received from [facility], [facility]   Exercise Vital Sign   . Days  of Exercise per Week: 0 days   . Minutes of Exercise per Session: Not on file  Stress: Stress Concern Present (09/09/2021)   Received [facility], [facility]   [facility] - Occupational Stress Questionnaire   . Feeling of Stress : To some extent  Social Connections: Unknown (09/17/2022)   Received from [facility], [facility]   Social Network   . Social Network: Not on file  Intimate Partner Violence: Unknown (09/17/2022)   Received from [facility], [facility]   HITS   . Physically Hurt: Not on file   . Insult or Talk Down To: Not on file   . Threaten Physical Harm: Not on file   . Scream or Curse: Not on file      Review of Systems  Vital Signs: BP (!) 150/86   Pulse 61   Temp 98.1 F (36.7 C)   Resp 16   Ht 6\' 2"  (1.88 m)   Wt 189 lb (85.7 kg)   SpO2 98%   BMI 24.27 kg/m    Physical Exam     Assessment/Plan: There are no diagnoses linked to this encounter.    General Counseling: [name] [name] understanding of the findings of todays visit and agrees with plan of treatment. I have discussed any further diagnostic evaluation that [name] be needed or ordered today. We also reviewed his medications today. he has been encouraged to call the office with any questions or concerns that should arise related to todays visit.    No orders of the defined types were placed in this encounter.   No orders of the defined types were placed in this encounter.   Return in about 1 year (around 01/22/2024) for CPE, AWV, [name] [name] PCP and otherwise as needed. .   Total time spent:*** Minutes Time spent includes review of chart, medications, test results, and follow up plan with the patient.   [place] Controlled Substance Database was reviewed by me.  This patient was seen by Sallyanne Kuster, FNP-C in collaboration with Dr. Beverely Risen as a part of collaborative care agreement.  Breanna Shorkey R. Tedd Sias, MSN, FNP-C Internal medicine

## 2023-01-23 DIAGNOSIS — N186 End stage renal disease: Secondary | ICD-10-CM | POA: Diagnosis not present

## 2023-01-23 DIAGNOSIS — Z992 Dependence on renal dialysis: Secondary | ICD-10-CM | POA: Diagnosis not present

## 2023-01-24 DIAGNOSIS — N186 End stage renal disease: Secondary | ICD-10-CM | POA: Diagnosis not present

## 2023-01-24 DIAGNOSIS — Z992 Dependence on renal dialysis: Secondary | ICD-10-CM | POA: Diagnosis not present

## 2023-01-25 DIAGNOSIS — N186 End stage renal disease: Secondary | ICD-10-CM | POA: Diagnosis not present

## 2023-01-25 DIAGNOSIS — Z992 Dependence on renal dialysis: Secondary | ICD-10-CM | POA: Diagnosis not present

## 2023-01-26 DIAGNOSIS — Z992 Dependence on renal dialysis: Secondary | ICD-10-CM | POA: Diagnosis not present

## 2023-01-26 DIAGNOSIS — N186 End stage renal disease: Secondary | ICD-10-CM | POA: Diagnosis not present

## 2023-01-27 DIAGNOSIS — Z992 Dependence on renal dialysis: Secondary | ICD-10-CM | POA: Diagnosis not present

## 2023-01-27 DIAGNOSIS — N186 End stage renal disease: Secondary | ICD-10-CM | POA: Diagnosis not present

## 2023-01-28 DIAGNOSIS — Z992 Dependence on renal dialysis: Secondary | ICD-10-CM | POA: Diagnosis not present

## 2023-01-28 DIAGNOSIS — N186 End stage renal disease: Secondary | ICD-10-CM | POA: Diagnosis not present

## 2023-01-29 DIAGNOSIS — N186 End stage renal disease: Secondary | ICD-10-CM | POA: Diagnosis not present

## 2023-01-29 DIAGNOSIS — D649 Anemia, unspecified: Secondary | ICD-10-CM | POA: Diagnosis not present

## 2023-01-29 DIAGNOSIS — N250 Renal osteodystrophy: Secondary | ICD-10-CM | POA: Diagnosis not present

## 2023-01-29 DIAGNOSIS — Z992 Dependence on renal dialysis: Secondary | ICD-10-CM | POA: Diagnosis not present

## 2023-01-30 DIAGNOSIS — N186 End stage renal disease: Secondary | ICD-10-CM | POA: Diagnosis not present

## 2023-01-30 DIAGNOSIS — Z992 Dependence on renal dialysis: Secondary | ICD-10-CM | POA: Diagnosis not present

## 2023-01-31 DIAGNOSIS — N186 End stage renal disease: Secondary | ICD-10-CM | POA: Diagnosis not present

## 2023-01-31 DIAGNOSIS — Z992 Dependence on renal dialysis: Secondary | ICD-10-CM | POA: Diagnosis not present

## 2023-02-01 DIAGNOSIS — N186 End stage renal disease: Secondary | ICD-10-CM | POA: Diagnosis not present

## 2023-02-01 DIAGNOSIS — Z992 Dependence on renal dialysis: Secondary | ICD-10-CM | POA: Diagnosis not present

## 2023-02-02 DIAGNOSIS — Z992 Dependence on renal dialysis: Secondary | ICD-10-CM | POA: Diagnosis not present

## 2023-02-02 DIAGNOSIS — N186 End stage renal disease: Secondary | ICD-10-CM | POA: Diagnosis not present

## 2023-02-03 DIAGNOSIS — Z992 Dependence on renal dialysis: Secondary | ICD-10-CM | POA: Diagnosis not present

## 2023-02-03 DIAGNOSIS — N186 End stage renal disease: Secondary | ICD-10-CM | POA: Diagnosis not present

## 2023-02-04 DIAGNOSIS — Z992 Dependence on renal dialysis: Secondary | ICD-10-CM | POA: Diagnosis not present

## 2023-02-04 DIAGNOSIS — N186 End stage renal disease: Secondary | ICD-10-CM | POA: Diagnosis not present

## 2023-02-05 DIAGNOSIS — Z992 Dependence on renal dialysis: Secondary | ICD-10-CM | POA: Diagnosis not present

## 2023-02-05 DIAGNOSIS — N186 End stage renal disease: Secondary | ICD-10-CM | POA: Diagnosis not present

## 2023-02-06 ENCOUNTER — Encounter: Payer: Managed Care, Other (non HMO) | Admitting: Dermatology

## 2023-02-06 DIAGNOSIS — Z992 Dependence on renal dialysis: Secondary | ICD-10-CM | POA: Diagnosis not present

## 2023-02-06 DIAGNOSIS — N186 End stage renal disease: Secondary | ICD-10-CM | POA: Diagnosis not present

## 2023-02-07 DIAGNOSIS — Z992 Dependence on renal dialysis: Secondary | ICD-10-CM | POA: Diagnosis not present

## 2023-02-07 DIAGNOSIS — N186 End stage renal disease: Secondary | ICD-10-CM | POA: Diagnosis not present

## 2023-02-08 DIAGNOSIS — N186 End stage renal disease: Secondary | ICD-10-CM | POA: Diagnosis not present

## 2023-02-08 DIAGNOSIS — Z992 Dependence on renal dialysis: Secondary | ICD-10-CM | POA: Diagnosis not present

## 2023-02-09 DIAGNOSIS — Z992 Dependence on renal dialysis: Secondary | ICD-10-CM | POA: Diagnosis not present

## 2023-02-09 DIAGNOSIS — N186 End stage renal disease: Secondary | ICD-10-CM | POA: Diagnosis not present

## 2023-02-10 DIAGNOSIS — Z992 Dependence on renal dialysis: Secondary | ICD-10-CM | POA: Diagnosis not present

## 2023-02-10 DIAGNOSIS — N186 End stage renal disease: Secondary | ICD-10-CM | POA: Diagnosis not present

## 2023-02-11 DIAGNOSIS — Z992 Dependence on renal dialysis: Secondary | ICD-10-CM | POA: Diagnosis not present

## 2023-02-11 DIAGNOSIS — N186 End stage renal disease: Secondary | ICD-10-CM | POA: Diagnosis not present

## 2023-02-12 ENCOUNTER — Encounter: Payer: Self-pay | Admitting: Podiatry

## 2023-02-12 ENCOUNTER — Ambulatory Visit (INDEPENDENT_AMBULATORY_CARE_PROVIDER_SITE_OTHER): Payer: Medicare Other | Admitting: Podiatry

## 2023-02-12 DIAGNOSIS — Z794 Long term (current) use of insulin: Secondary | ICD-10-CM | POA: Diagnosis not present

## 2023-02-12 DIAGNOSIS — N186 End stage renal disease: Secondary | ICD-10-CM | POA: Diagnosis not present

## 2023-02-12 DIAGNOSIS — E1142 Type 2 diabetes mellitus with diabetic polyneuropathy: Secondary | ICD-10-CM

## 2023-02-12 DIAGNOSIS — Z992 Dependence on renal dialysis: Secondary | ICD-10-CM | POA: Diagnosis not present

## 2023-02-12 NOTE — Progress Notes (Signed)
He presents today for his diabetic foot exam.  He states that he is completely numb to his distal thighs.  He states that his hemoglobin A1c is 7.5 and that he has not noticed any new lesions lately though he has had some recently on the toes that have gone on to heal up he feels that those are coming from his electric chair or or wheelchair at home.  Objective: Vital signs are stable he is alert and oriented x 3 pulses are minimally palpable dorsalis pedis bilateral nonpalpable PT capillary fill time is immediate feet are erythematous.  No cellulitis drainage odor no open lesions or wounds at this point.  Toenails are somewhat jagged and dystrophic.  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  Nail dystrophy possible onychomycosis.  Plan: Debrided his nails for him today some of them were loose and able to be cut others were already torn off.  Follow-up with him in 6 months sooner if needed.

## 2023-02-13 ENCOUNTER — Other Ambulatory Visit: Payer: Self-pay | Admitting: Nurse Practitioner

## 2023-02-13 DIAGNOSIS — N186 End stage renal disease: Secondary | ICD-10-CM | POA: Diagnosis not present

## 2023-02-13 DIAGNOSIS — Z0001 Encounter for general adult medical examination with abnormal findings: Secondary | ICD-10-CM

## 2023-02-13 DIAGNOSIS — Z992 Dependence on renal dialysis: Secondary | ICD-10-CM | POA: Diagnosis not present

## 2023-02-14 DIAGNOSIS — E118 Type 2 diabetes mellitus with unspecified complications: Secondary | ICD-10-CM | POA: Diagnosis not present

## 2023-02-14 DIAGNOSIS — Z794 Long term (current) use of insulin: Secondary | ICD-10-CM | POA: Diagnosis not present

## 2023-02-14 DIAGNOSIS — N186 End stage renal disease: Secondary | ICD-10-CM | POA: Diagnosis not present

## 2023-02-14 DIAGNOSIS — Z992 Dependence on renal dialysis: Secondary | ICD-10-CM | POA: Diagnosis not present

## 2023-02-15 DIAGNOSIS — Z992 Dependence on renal dialysis: Secondary | ICD-10-CM | POA: Diagnosis not present

## 2023-02-15 DIAGNOSIS — N186 End stage renal disease: Secondary | ICD-10-CM | POA: Diagnosis not present

## 2023-02-16 DIAGNOSIS — Z992 Dependence on renal dialysis: Secondary | ICD-10-CM | POA: Diagnosis not present

## 2023-02-16 DIAGNOSIS — N186 End stage renal disease: Secondary | ICD-10-CM | POA: Diagnosis not present

## 2023-02-17 DIAGNOSIS — Z992 Dependence on renal dialysis: Secondary | ICD-10-CM | POA: Diagnosis not present

## 2023-02-17 DIAGNOSIS — N186 End stage renal disease: Secondary | ICD-10-CM | POA: Diagnosis not present

## 2023-02-18 DIAGNOSIS — Z992 Dependence on renal dialysis: Secondary | ICD-10-CM | POA: Diagnosis not present

## 2023-02-18 DIAGNOSIS — N186 End stage renal disease: Secondary | ICD-10-CM | POA: Diagnosis not present

## 2023-02-19 DIAGNOSIS — Z992 Dependence on renal dialysis: Secondary | ICD-10-CM | POA: Diagnosis not present

## 2023-02-19 DIAGNOSIS — N186 End stage renal disease: Secondary | ICD-10-CM | POA: Diagnosis not present

## 2023-02-20 DIAGNOSIS — N186 End stage renal disease: Secondary | ICD-10-CM | POA: Diagnosis not present

## 2023-02-20 DIAGNOSIS — Z992 Dependence on renal dialysis: Secondary | ICD-10-CM | POA: Diagnosis not present

## 2023-02-21 DIAGNOSIS — Z992 Dependence on renal dialysis: Secondary | ICD-10-CM | POA: Diagnosis not present

## 2023-02-21 DIAGNOSIS — N186 End stage renal disease: Secondary | ICD-10-CM | POA: Diagnosis not present

## 2023-02-22 DIAGNOSIS — N186 End stage renal disease: Secondary | ICD-10-CM | POA: Diagnosis not present

## 2023-02-22 DIAGNOSIS — Z992 Dependence on renal dialysis: Secondary | ICD-10-CM | POA: Diagnosis not present

## 2023-02-23 DIAGNOSIS — Z992 Dependence on renal dialysis: Secondary | ICD-10-CM | POA: Diagnosis not present

## 2023-02-23 DIAGNOSIS — N186 End stage renal disease: Secondary | ICD-10-CM | POA: Diagnosis not present

## 2023-02-24 ENCOUNTER — Encounter: Payer: Self-pay | Admitting: Nurse Practitioner

## 2023-02-24 DIAGNOSIS — I700 Atherosclerosis of aorta: Secondary | ICD-10-CM | POA: Insufficient documentation

## 2023-02-24 DIAGNOSIS — N186 End stage renal disease: Secondary | ICD-10-CM | POA: Diagnosis not present

## 2023-02-24 DIAGNOSIS — Z992 Dependence on renal dialysis: Secondary | ICD-10-CM | POA: Insufficient documentation

## 2023-02-24 NOTE — Progress Notes (Signed)
[name] [name] [address] [name] [name] [address] [phone]  Patient Name: [name] [name] "[name] [name]" DOB: 28-Dec-1960 MRN: [id number]  Date of Service: 01/22/23   Medicare Annual Wellness Visit (Subsequent)  PCP: Sallyanne Kuster, NP Cardiologist: Julien Nordmann, MD  Nephrologist: Dr. Mosetta Pigeon Endocrinologist: Dr. Crista Curb at [facility] Gastroenterologist: Dr. [name] Vascular Surgery: Dr. Festus Barren Podiatrist: Dr. Ernestene Kiel Dermatologist: Dr. Armida Sans Neurologist: Dr. Cristopher Peru; Janice Coffin PA-C Eye doctor: sees retina specialist regularly for eye injections.     Subjective:  CC -- Medicare Annual wellness visit.  Additional concerns? no Patient is well appearing and sitting upright on the exam table. He ambulates using a motorized scooter and is able to stand and walk a few feet without assistance. He is alert and oriented. He is in good spirits and reports that his CAPD has been going well and his glucose readings are staying low and usually within target range.      General Healthcare: Medication Compliance: yes  ASCVD Risk as of 02/24/23: n/a, patient has heart disease   Aspirin: yes  Dx Hypertension: yes, on medication  Dx Hyperlipidemia: yes,  on repatha and ezetimibe Diabetes: yes, sees endocrinology Dx Obesity: no  Weight Loss: no  Urinary Incontinence: no   The ASCVD Risk score (Arnett DK, et al., 2019) failed to calculate for the following reasons:   The patient has a prior MI or stroke diagnosis   Social Determinants of Health: SDOH Screenings   Food Insecurity: No Food Insecurity (09/09/2021)   Received from [facility], [facility]  Housing: Low Risk  (02/23/2023)  Transportation Needs: No Transportation Needs (04/11/2021)   Received from [facility], [facility]  Utilities: Not At Risk (02/23/2023)  Alcohol Screen: Low Risk  (04/17/2021)  Depression (PHQ2-9): Low Risk  (01/22/2023)  Financial  Resource Strain: Low Risk  (09/09/2021)   Received from [facility], [facility]  Physical Activity: Unknown (09/09/2021)   Received from [facility], [facility]  Social Connections: Unknown (09/17/2022)   Received from [facility], [facility]  Stress: No Stress Concern Present (02/23/2023)  Tobacco Use: Medium Risk (02/12/2023)  Health Literacy: Adequate Health Literacy (02/23/2023)     Where does the patient live? At home, in house with his wife Does the patient drive? yes Family support: wife and family are very supportive Advance Directives?  No  Employment: no, disabled Able to read and write: yes    Cancer:  Colorectal >> Colonoscopy: yes, done in May 2024 Due date: in 5 years possibly.  Lung >> Tobacco Use: yes, quit a while ago with a 9.8 pack year history    - If so, previous Low-Dose CT screen: no, not indicated   Skin >> Suspicious lesions: no, sees dermatology yearly.    Other: Osteoporosis: no  PSA level: yes, 0.9 in 2021 Zoster Vaccine: no, declined Flu Vaccine: no, not available yet  RSV Vaccine: no, declined  Pneumonia Vaccine: no, declined       01/22/2023    2:55 PM  MMSE - Mini Mental State Exam  Orientation to time 5  Orientation to Place 5  Registration 3  Attention/ Calculation 0  Recall 3  Language- name 2 objects 2  Language- repeat 1  Language- follow 3 step command 3  Language- read & follow direction 1  Write a sentence 0  Copy design 1  Total score 24    Functional Status Survey: Is the patient deaf or have difficulty hearing?: No  Does the patient have difficulty seeing, even when wearing glasses/contacts?: No Does the patient have difficulty concentrating, remembering, or making decisions?: Yes Does the patient have difficulty walking or climbing stairs?: Yes Does the patient have difficulty dressing or bathing?: No Does the patient have difficulty doing errands alone such as visiting a doctor's office or shopping?: Yes       01/04/2022   10:06 AM 01/10/2022   12:36 PM 01/16/2022    3:02 PM 07/17/2022    1:17 PM 01/22/2023    2:54 PM  Fall Risk  Falls in the past year? 0  0 0 0  Was there an injury with Fall?    0 0  Fall Risk Category Calculator    0 0  (RETIRED) Patient Fall Risk Level  High fall risk     Patient at Risk for Falls Due to    No Fall Risks No Fall Risks  Fall risk Follow up    Falls evaluation completed Falls evaluation completed        01/22/2023    2:54 PM  Depression screen PHQ 2/9  Decreased Interest 0  Down, Depressed, Hopeless 0  PHQ - 2 Score 0        05/15/2021   10:41 AM 02/21/2021    2:27 PM  GAD 7 : Generalized Anxiety Score  Nervous, Anxious, on Edge 0 0  Control/stop worrying 0 0  Worry too much - different things 0 0  Trouble relaxing 0 0  Restless 0 0  Easily annoyed or irritable 0 0  Afraid - awful might happen 0 0  Total GAD 7 Score 0 0  Anxiety Difficulty Not difficult at all Not difficult at all      Review of Systems  Constitutional: Negative.   HENT: Negative.    Respiratory: Negative.    Cardiovascular: Negative.   Genitourinary: Negative.   Musculoskeletal:  Positive for back pain and neck pain.  Skin: Negative.   Neurological: Negative.   Psychiatric/Behavioral: Negative.       Past Medical History Patient Active Problem List   Diagnosis Date Noted   Polyp of transverse colon 10/17/2022   Special screening for malignant neoplasms, colon 10/17/2022   Barrett's esophagus with dysplasia 09/24/2022   Hypokalemia    Wheeze    COVID-19 virus infection    Elevated d-dimer    Elevated troponin    Acute kidney injury superimposed on CKD (HCC)    Hyponatremia    Chronic systolic CHF (congestive heart failure) (HCC)    Acute respiratory failure with hypoxia (HCC) 02/08/2021   Barrett's esophagus with low grade dysplasia    Diabetes mellitus with stage 4 chronic kidney disease GFR 15-29 (HCC) 06/01/2020   Serum albumin decreased 06/01/2020    Vitamin D deficiency 06/01/2020   Elevated hemoglobin A1c 06/01/2020   Elevated sed rate 06/01/2020   Elevated brain natriuretic peptide (BNP) level 06/01/2020   Insulin dependent type 2 diabetes mellitus, uncontrolled 06/01/2020   Diabetes mellitus with complication, with long-term current use of insulin (HCC) 06/01/2020   Chronic pain syndrome 05/24/2020   Pharmacologic therapy 05/24/2020   Disorder of skeletal system 05/24/2020   Problems influencing health status 05/24/2020   DDD (degenerative disc disease), cervical 05/24/2020   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, C5-6) (Left) 05/24/2020   Cervical Grade 1 Retrolisthesis of C4/C5 and C5/C6 05/24/2020   Chronic hand pain (Bilateral) 05/24/2020   Chronic leg and foot pain (Bilateral) 05/24/2020   Chronic lower extremity numbness from knee  down (Bilateral) 05/24/2020   Chronic greater occipital neuralgia (Bilateral) 05/24/2020   Cervical facet syndrome (Left) 05/24/2020   Cervical disc disease with myelopathy 04/26/2020   Cervicalgia 04/09/2020   Syncope and collapse 04/09/2020   Esophageal dysphagia    Gastroesophageal reflux disease with esophagitis without hemorrhage    PAD (peripheral artery disease) (HCC) 09/27/2019   Diabetes (HCC) 09/14/2019   Swelling of limb 09/14/2019   Lymphedema 09/14/2019   Chronic obstructive pulmonary disease (HCC) 09/05/2019   Shortness of breath 09/05/2019   Chest pain 07/16/2018   Essential hypertension 04/28/2018   History of pancreatitis 11/16/2017   Statin-induced myositis 11/16/2017   Type 2 diabetes mellitus with diabetic polyneuropathy, with long-term current use of insulin (HCC) 11/16/2017   Coronary atherosclerosis 11/15/2016   Heel ulcer due to DM (HCC) 12/09/2012   Septic olecranon bursitis 10/14/2011   Cellulitis 10/06/2011   Depression with anxiety 02/22/2011   Diabetic peripheral neuropathy (HCC) 02/22/2011   Migraines 02/22/2011   Other hyperlipidemia 02/22/2011   Sleep  apnea 11/22/2010   Coronary artery disease with history of myocardial infarction without history of CABG 10/21/2009    Medications- reviewed and updated Current Outpatient Medications  Medication Sig Dispense Refill   aspirin EC 81 MG tablet Take 81 mg by mouth daily. Swallow whole.     carvedilol (COREG) 12.5 MG tablet TAKE 1 AND 1/2 TABLETS BY MOUTH TWICE DAILY 270 tablet 2   cholecalciferol (VITAMIN D3) 25 MCG (1000 UNIT) tablet Take 2,000 Units by mouth 2 (two) times daily.     docusate calcium (SURFAK) 240 MG capsule Take 240 mg by mouth daily.     Evolocumab (REPATHA SURECLICK) 140 MG/ML SOAJ INJECT 140 MG UNDER THE SKIN EVERY 14 DAYS 6 mL 3   ezetimibe (ZETIA) 10 MG tablet TAKE 1 TABLET(10 MG) BY MOUTH DAILY 90 tablet 2   insulin lispro (HUMALOG) 100 UNIT/ML KwikPen Inject into the skin.     Insulin Pen Needle (B-D ULTRAFINE III SHORT PEN) 31G X 8 MM MISC To use with pen basal and mealtime coverage insulin pens 5 times daily. DX: E11.65 500 each 1   isosorbide mononitrate (IMDUR) 30 MG 24 hr tablet Take 1 tablet (30 mg total) by mouth 2 (two) times daily. 180 tablet 3   levofloxacin (LEVAQUIN) 750 MG tablet SMARTSIG:1 Tablet(s) By Mouth     losartan (COZAAR) 100 MG tablet Take by mouth.     losartan (COZAAR) 25 MG tablet Take 25 mg by mouth every evening.     nortriptyline (PAMELOR) 10 MG capsule Take 50 mg by mouth at bedtime.     NOVOLOG FLEXPEN 100 UNIT/ML FlexPen Inject into the skin 3 (three) times daily with meals. 3 x 's daily with meals - SSI     polyethylene glycol-electrolytes (NULYTELY) 420 g solution 5pm two nights before procedure Mix and drink 8oz every 20 mins until half of is gone rthen 5pm the night before procedure drink 8oz every 20 mins until gone 5 hours morning of procedure mix and drink 8oz every 15 mins until 1/2 of 2nd jug is gone 8000 mL 0   Ruxolitinib Phosphate (OPZELURA) 1.5 % CREA Apply to skin daily, legs (Patient taking differently: Apply 1 Application  topically daily. Apply to skin daily, legs) 60 g 2   torsemide (DEMADEX) 20 MG tablet Take 1 tablet (20 mg total) by mouth 2 (two) times daily. 180 tablet 2   TOUJEO MAX SOLOSTAR 300 UNIT/ML Solostar Pen SMARTSIG:80 Unit(s) SUB-Q Daily  TRESIBA FLEXTOUCH 200 UNIT/ML FlexTouch Pen 100 Units at bedtime.     pantoprazole (PROTONIX) 40 MG tablet TAKE 1 TABLET(40 MG) BY MOUTH TWICE DAILY BEFORE A MEAL 180 tablet 3   No current facility-administered medications for this visit.    Objective: BP (!) 150/86   Pulse 61   Temp 98.1 F (36.7 C)   Resp 16   Ht 6\' 2"  (1.88 m)   Wt 189 lb (85.7 kg)   SpO2 98%   BMI 24.27 kg/m  Gen: NAD, alert, cooperative  HEENT: NCAT, EOMI, PERRL CV: RRR, good S1/S2, no murmur Resp: CTABL, no wheezes, non-labored Abd: Soft, Non Tender, Non Distended, BS present, no guarding or organomegaly. CAPD access site clean, dry and dressing is intact. No redness, irritation or signs of infection noted at the site.  Ext: No edema, warm Neuro: Alert and oriented, No gross deficits   Assessment/Plan:  1. Encounter for subsequent annual wellness visit (AWV) in Medicare patient Age-appropriate preventive screenings and vaccinations discussed, annual physical exam completed. Routine labs for health maintenance deferred, patient has had a lot of labs drawn lately. PHM updated.   2. Type 2 diabetes mellitus with chronic kidney disease on chronic dialysis, with long-term current use of insulin (HCC) Improved greatly since starting CAPD. Followed by endocrinology, Dr. Shawnee Knapp  3. Stage 5 chronic kidney disease on chronic dialysis (HCC) On CAPD and managed by Dr. Thedore Mins  4. Hypertension associated with stage 5 chronic kidney disease due to type 2 diabetes mellitus (HCC) Stable, controlled with prescribed medications.   5. Chronic systolic CHF (congestive heart failure) (HCC) Followed by cardiology, Dr. Mariah Milling  6. Aortic atherosclerosis (HCC) Continue repatha and ezetimibe  as prescribed  7. Coronary artery disease with history of myocardial infarction without history of CABG Followed by cardiology, Dr. Mariah Milling  8. Peripheral arterial disease (HCC) Followed by Dr. Wyn Quaker at [facility]  9. Gastroesophageal reflux disease with esophagitis without hemorrhage Stable, no issues   10. Peritoneal dialysis catheter in place [facility]) Doing well with CAPD, managed by Dr. Thedore Mins    No orders of the defined types were placed in this encounter.   No orders of the defined types were placed in this encounter.   Return in about 1 year (around 01/22/2024) for CPE, AWV, Kasiah Manka PCP and otherwise as needed. .  Total time spent:30 Minutes Time spent includes review of chart, medications, test results, and follow up plan with the patient.   [place] Controlled Substance Database was reviewed by me.  This patient was seen by Sallyanne Kuster, FNP-C in collaboration with Dr. Beverely Risen as a part of collaborative care agreement.  Allexis Bordenave R. Tedd Sias, MSN, FNP-C Internal medicine/Primary Care [facility]

## 2023-02-25 DIAGNOSIS — N186 End stage renal disease: Secondary | ICD-10-CM | POA: Diagnosis not present

## 2023-02-25 DIAGNOSIS — Z992 Dependence on renal dialysis: Secondary | ICD-10-CM | POA: Diagnosis not present

## 2023-02-26 DIAGNOSIS — N186 End stage renal disease: Secondary | ICD-10-CM | POA: Diagnosis not present

## 2023-02-26 DIAGNOSIS — Z992 Dependence on renal dialysis: Secondary | ICD-10-CM | POA: Diagnosis not present

## 2023-02-27 DIAGNOSIS — N186 End stage renal disease: Secondary | ICD-10-CM | POA: Diagnosis not present

## 2023-02-27 DIAGNOSIS — Z992 Dependence on renal dialysis: Secondary | ICD-10-CM | POA: Diagnosis not present

## 2023-02-28 DIAGNOSIS — N250 Renal osteodystrophy: Secondary | ICD-10-CM | POA: Diagnosis not present

## 2023-02-28 DIAGNOSIS — D649 Anemia, unspecified: Secondary | ICD-10-CM | POA: Diagnosis not present

## 2023-02-28 DIAGNOSIS — N186 End stage renal disease: Secondary | ICD-10-CM | POA: Diagnosis not present

## 2023-02-28 DIAGNOSIS — E109 Type 1 diabetes mellitus without complications: Secondary | ICD-10-CM | POA: Diagnosis not present

## 2023-02-28 DIAGNOSIS — Z992 Dependence on renal dialysis: Secondary | ICD-10-CM | POA: Diagnosis not present

## 2023-03-01 DIAGNOSIS — Z992 Dependence on renal dialysis: Secondary | ICD-10-CM | POA: Diagnosis not present

## 2023-03-01 DIAGNOSIS — N186 End stage renal disease: Secondary | ICD-10-CM | POA: Diagnosis not present

## 2023-03-02 DIAGNOSIS — N186 End stage renal disease: Secondary | ICD-10-CM | POA: Diagnosis not present

## 2023-03-02 DIAGNOSIS — Z992 Dependence on renal dialysis: Secondary | ICD-10-CM | POA: Diagnosis not present

## 2023-03-03 DIAGNOSIS — N186 End stage renal disease: Secondary | ICD-10-CM | POA: Diagnosis not present

## 2023-03-03 DIAGNOSIS — Z992 Dependence on renal dialysis: Secondary | ICD-10-CM | POA: Diagnosis not present

## 2023-03-04 DIAGNOSIS — Z992 Dependence on renal dialysis: Secondary | ICD-10-CM | POA: Diagnosis not present

## 2023-03-04 DIAGNOSIS — N186 End stage renal disease: Secondary | ICD-10-CM | POA: Diagnosis not present

## 2023-03-05 DIAGNOSIS — Z992 Dependence on renal dialysis: Secondary | ICD-10-CM | POA: Diagnosis not present

## 2023-03-05 DIAGNOSIS — N186 End stage renal disease: Secondary | ICD-10-CM | POA: Diagnosis not present

## 2023-03-06 DIAGNOSIS — I10 Essential (primary) hypertension: Secondary | ICD-10-CM | POA: Diagnosis not present

## 2023-03-06 DIAGNOSIS — N186 End stage renal disease: Secondary | ICD-10-CM | POA: Diagnosis not present

## 2023-03-06 DIAGNOSIS — E1042 Type 1 diabetes mellitus with diabetic polyneuropathy: Secondary | ICD-10-CM | POA: Diagnosis not present

## 2023-03-06 DIAGNOSIS — Z992 Dependence on renal dialysis: Secondary | ICD-10-CM | POA: Diagnosis not present

## 2023-03-07 ENCOUNTER — Ambulatory Visit: Payer: Medicare Other | Attending: Student | Admitting: Student

## 2023-03-07 ENCOUNTER — Encounter: Payer: Self-pay | Admitting: Student

## 2023-03-07 VITALS — BP 178/80 | HR 60 | Ht 73.0 in | Wt 185.6 lb

## 2023-03-07 DIAGNOSIS — N186 End stage renal disease: Secondary | ICD-10-CM | POA: Diagnosis not present

## 2023-03-07 DIAGNOSIS — I252 Old myocardial infarction: Secondary | ICD-10-CM | POA: Diagnosis not present

## 2023-03-07 DIAGNOSIS — I5022 Chronic systolic (congestive) heart failure: Secondary | ICD-10-CM

## 2023-03-07 DIAGNOSIS — I10 Essential (primary) hypertension: Secondary | ICD-10-CM | POA: Diagnosis not present

## 2023-03-07 DIAGNOSIS — Z992 Dependence on renal dialysis: Secondary | ICD-10-CM

## 2023-03-07 DIAGNOSIS — Z951 Presence of aortocoronary bypass graft: Secondary | ICD-10-CM | POA: Diagnosis not present

## 2023-03-07 DIAGNOSIS — I2510 Atherosclerotic heart disease of native coronary artery without angina pectoris: Secondary | ICD-10-CM

## 2023-03-07 MED ORDER — AMLODIPINE BESYLATE 5 MG PO TABS: 5.0000 mg | ORAL_TABLET | Freq: Every day | ORAL | 3 refills | Status: DC

## 2023-03-07 NOTE — Progress Notes (Signed)
Cardiology Clinic Note   Date: 03/07/2023 ID: Finton Quincey, DOB 06/26/1960, MRN [id number]  Primary Cardiologist:  Julien Nordmann, MD  Patient Profile    [name] [name] is a 62 y.o. male who presents to the clinic today for evaluation of high blood pressure.     Past medical history significant for: CAD. CABG x 4 2018 (performed in [place]): LIMA to LAD, SVG to RCA, SVG to OM1, SVG to D1. Lexiscan Myoview 03/03/2018: Small to medium sized inferolateral infarction.  No evidence of inducible ischemia.  Mild to moderately depressed LV systolic function. PAD. Angioplasty of right SFA 10/30/2016 (performed at [place]). ABI 04/12/2022: Right ABI within normal range.  Right TBI abnormal.  Left ABI indicates mild left lower extremity arterial disease.  Left TBI is normal. Chronic systolic heart failure.   Echo 02/09/2021: EF 50 to 55%.  Septal wall hypokinesis consistent with postoperative state.  Grade II DD.  Normal RV function.  Mild LAE.  Mild aortic valve sclerosis without stenosis.  Mild MAC. Hypertension. Hyperlipidemia. COPD. OSA. T2DM. CKD stage V. On peritoneal dialysis.     History of Present Illness    [name] [name] was first evaluated by Dr. Mariah Milling on 06/01/2019 to establish care at the request of Blima Ledger, NP.  Hospital records from [facility] were reviewed.  Notes indicated additional EF 35% (presumably in 2018).  EF 50% in October 2019, 40 to 45% in October 2020.  Lexiscan Myoview 2019 showed small to moderate inferolateral infarction without ischemia.  Notes indicated Entresto unable to be increased secondary to high potassium levels.  Patient was last seen in the office by Dr. Mariah Milling on 05/13/2022 for routine follow-up.  He was doing well at that time and no medication changes were made.  Discussed the use of AI scribe software for clinical note transcription with the patient, who gave verbal consent to proceed.  The patient presents with recent increase  in blood pressure. He denies any changes in diet, fluid retention, or new symptoms such as leg swelling, blurred vision, headaches, or dizziness. He reports experiencing rapid temperature changes, described as "hot flashes," which coincide with the rise in blood pressure. Home BP ranges from 124-196/60-90. He typically takes his BP 2 hours after medication. He was started on clonidine by nephrology about 1 month ago and is now taking 0.1 mg three times a day with no improvement to his symptoms.   In addition to hypertension, the patient is also managing end-stage renal disease with peritoneal dialysis and has been keeping a detailed log of BP, weight, and glucose levels. He reports no significant changes in weight or glucose levels. He also denies cardiac symptoms such as chest pain or shortness of breath.        ROS: All other systems reviewed and are otherwise negative except as noted in History of Present Illness.  Studies Reviewed    EKG Interpretation Date/Time:  Friday March 07 2023 15:19:29 EDT Ventricular Rate:  60 PR Interval:  166 QRS Duration:  122 QT Interval:  452 QTC Calculation: 452 R Axis:   147  Text Interpretation: Normal sinus rhythm LVH When compared with ECG of 05/13/2022 No significant changes Confirmed by Carlos Levering [phone]) on 03/07/2023 3:29:30 PM   Risk Assessment/Calculations      HYPERTENSION CONTROL Vitals:   03/07/23 1511 03/07/23 1634  BP: (!) 190/80 (!) 178/80    The patient's blood pressure is elevated above target today.  In order to address the patient's elevated BP: A  new medication was prescribed today.           Physical Exam    VS:  BP (!) 178/80 (BP Location: Right Arm, Patient Position: Sitting, Cuff Size: Normal)   Pulse 60   Ht 6\' 1"  (1.854 m)   Wt 185 lb 10 oz (84.2 kg)   SpO2 98%   BMI 24.49 kg/m  , BMI Body mass index is 24.49 kg/m.  GEN: Well nourished, well developed, in no acute distress. Neck: No JVD or  carotid bruits. Cardiac:  RRR. No murmurs. No rubs or gallops.   Respiratory:  Respirations regular and unlabored. Clear to auscultation without rales, wheezing or rhonchi. GI: Soft, nontender, nondistended. Extremities: Radials/DP/PT 2+ and equal bilaterally. No clubbing or cyanosis. No edema.  Skin: Warm and dry, no rash. Neuro: Strength intact.  Assessment & Plan      Hypertension Uncontrolled despite being on Carvedilol, Losartan, and Clonidine 0.1mg  TID. He denies headaches, vision changes or dizziness. No changes in volume status or diet. BP today 190/80 on intake and 178/80 on my recheck. Home BP ranges from 124-196/60-90.  -Add Amlodipine 5mg  daily in the morning. -Continue carvedilol, losartan, and clonidine.  -Continue to monitor BP.   CAD S/p CABG x 4 2018. Patient denies chest pain, pressure, or tightness.  -Continue aspirin, carvedilol, Repatha, Zetia, isosorbide.   Chronic systolic heart disease  Echo September 2022 showed EF 50-55% , grade II DD. Patient denies lower extremity edema or dyspnea. Weight is stable. Patient manages volume with peritoneal dialysis. He has been intolerant of Entresto in the past secondary to hyperkalemia.  -Continue losartan, isosorbide, torsemide.   End-Stage Renal Disease on Peritoneal Dialysis Stable with no new symptoms. -Continue current management. -Share lab results from nephrology with this office.     Disposition: Add amlodipine 5 mg daily. Return in 2 months or sooner as needed.          Signed, Etta Grandchild. Donoven Pett, DNP, NP-C

## 2023-03-07 NOTE — Patient Instructions (Signed)
Medication Instructions:  Your physician has recommended you make the following change in your medication:   START Amlodipine 5 mg once daily   *If you need a refill on your cardiac medications before your next appointment, please call your pharmacy*   Lab Work: None  If you have labs (blood work) drawn today and your tests are completely normal, you will receive your results only by: MyChart Message (if you have MyChart) OR A paper copy in the mail If you have any lab test that is abnormal or we need to change your treatment, we will call you to review the results.   Testing/Procedures: None   Follow-Up: At [facility], you and your health needs are our priority.  As part of our continuing mission to provide you with exceptional heart care, we have created designated Provider Care Teams.  These Care Teams include your primary Cardiologist (physician) and Advanced Practice Providers (APPs -  Physician Assistants and Nurse Practitioners) who all work together to provide you with the care you need, when you need it.   Your next appointment:   8 week(s)  Provider:   Julien Nordmann, MD

## 2023-03-08 DIAGNOSIS — N186 End stage renal disease: Secondary | ICD-10-CM | POA: Diagnosis not present

## 2023-03-08 DIAGNOSIS — Z992 Dependence on renal dialysis: Secondary | ICD-10-CM | POA: Diagnosis not present

## 2023-03-09 DIAGNOSIS — N186 End stage renal disease: Secondary | ICD-10-CM | POA: Diagnosis not present

## 2023-03-09 DIAGNOSIS — Z992 Dependence on renal dialysis: Secondary | ICD-10-CM | POA: Diagnosis not present

## 2023-03-10 DIAGNOSIS — N186 End stage renal disease: Secondary | ICD-10-CM | POA: Diagnosis not present

## 2023-03-10 DIAGNOSIS — Z992 Dependence on renal dialysis: Secondary | ICD-10-CM | POA: Diagnosis not present

## 2023-03-11 DIAGNOSIS — N186 End stage renal disease: Secondary | ICD-10-CM | POA: Diagnosis not present

## 2023-03-11 DIAGNOSIS — Z992 Dependence on renal dialysis: Secondary | ICD-10-CM | POA: Diagnosis not present

## 2023-03-12 DIAGNOSIS — Z992 Dependence on renal dialysis: Secondary | ICD-10-CM | POA: Diagnosis not present

## 2023-03-12 DIAGNOSIS — N186 End stage renal disease: Secondary | ICD-10-CM | POA: Diagnosis not present

## 2023-03-13 DIAGNOSIS — N186 End stage renal disease: Secondary | ICD-10-CM | POA: Diagnosis not present

## 2023-03-13 DIAGNOSIS — Z992 Dependence on renal dialysis: Secondary | ICD-10-CM | POA: Diagnosis not present

## 2023-03-14 DIAGNOSIS — N186 End stage renal disease: Secondary | ICD-10-CM | POA: Diagnosis not present

## 2023-03-14 DIAGNOSIS — Z992 Dependence on renal dialysis: Secondary | ICD-10-CM | POA: Diagnosis not present

## 2023-03-15 DIAGNOSIS — Z992 Dependence on renal dialysis: Secondary | ICD-10-CM | POA: Diagnosis not present

## 2023-03-15 DIAGNOSIS — N186 End stage renal disease: Secondary | ICD-10-CM | POA: Diagnosis not present

## 2023-03-16 DIAGNOSIS — N186 End stage renal disease: Secondary | ICD-10-CM | POA: Diagnosis not present

## 2023-03-16 DIAGNOSIS — Z794 Long term (current) use of insulin: Secondary | ICD-10-CM | POA: Diagnosis not present

## 2023-03-16 DIAGNOSIS — E118 Type 2 diabetes mellitus with unspecified complications: Secondary | ICD-10-CM | POA: Diagnosis not present

## 2023-03-16 DIAGNOSIS — Z992 Dependence on renal dialysis: Secondary | ICD-10-CM | POA: Diagnosis not present

## 2023-03-17 DIAGNOSIS — Z992 Dependence on renal dialysis: Secondary | ICD-10-CM | POA: Diagnosis not present

## 2023-03-17 DIAGNOSIS — N186 End stage renal disease: Secondary | ICD-10-CM | POA: Diagnosis not present

## 2023-03-18 DIAGNOSIS — Z992 Dependence on renal dialysis: Secondary | ICD-10-CM | POA: Diagnosis not present

## 2023-03-18 DIAGNOSIS — N186 End stage renal disease: Secondary | ICD-10-CM | POA: Diagnosis not present

## 2023-03-19 DIAGNOSIS — N186 End stage renal disease: Secondary | ICD-10-CM | POA: Diagnosis not present

## 2023-03-19 DIAGNOSIS — Z992 Dependence on renal dialysis: Secondary | ICD-10-CM | POA: Diagnosis not present

## 2023-03-20 DIAGNOSIS — N186 End stage renal disease: Secondary | ICD-10-CM | POA: Diagnosis not present

## 2023-03-20 DIAGNOSIS — Z992 Dependence on renal dialysis: Secondary | ICD-10-CM | POA: Diagnosis not present

## 2023-03-21 DIAGNOSIS — N186 End stage renal disease: Secondary | ICD-10-CM | POA: Diagnosis not present

## 2023-03-21 DIAGNOSIS — Z992 Dependence on renal dialysis: Secondary | ICD-10-CM | POA: Diagnosis not present

## 2023-03-22 DIAGNOSIS — Z992 Dependence on renal dialysis: Secondary | ICD-10-CM | POA: Diagnosis not present

## 2023-03-22 DIAGNOSIS — N186 End stage renal disease: Secondary | ICD-10-CM | POA: Diagnosis not present

## 2023-03-23 DIAGNOSIS — Z992 Dependence on renal dialysis: Secondary | ICD-10-CM | POA: Diagnosis not present

## 2023-03-23 DIAGNOSIS — N186 End stage renal disease: Secondary | ICD-10-CM | POA: Diagnosis not present

## 2023-03-24 ENCOUNTER — Other Ambulatory Visit: Payer: Self-pay

## 2023-03-24 ENCOUNTER — Emergency Department: Payer: Medicare Other

## 2023-03-24 ENCOUNTER — Observation Stay
Admission: EM | Admit: 2023-03-24 | Discharge: 2023-03-25 | Disposition: A | Discharge status: Home / Self Care | Payer: Medicare Other | Attending: Internal Medicine | Admitting: Internal Medicine

## 2023-03-24 DIAGNOSIS — Z87891 Personal history of nicotine dependence: Secondary | ICD-10-CM | POA: Insufficient documentation

## 2023-03-24 DIAGNOSIS — D631 Anemia in chronic kidney disease: Secondary | ICD-10-CM | POA: Diagnosis not present

## 2023-03-24 DIAGNOSIS — Z992 Dependence on renal dialysis: Secondary | ICD-10-CM

## 2023-03-24 DIAGNOSIS — R001 Bradycardia, unspecified: Principal | ICD-10-CM

## 2023-03-24 DIAGNOSIS — E1122 Type 2 diabetes mellitus with diabetic chronic kidney disease: Secondary | ICD-10-CM | POA: Diagnosis not present

## 2023-03-24 DIAGNOSIS — Z8616 Personal history of COVID-19: Secondary | ICD-10-CM | POA: Diagnosis not present

## 2023-03-24 DIAGNOSIS — N186 End stage renal disease: Secondary | ICD-10-CM | POA: Insufficient documentation

## 2023-03-24 DIAGNOSIS — F17210 Nicotine dependence, cigarettes, uncomplicated: Secondary | ICD-10-CM | POA: Diagnosis not present

## 2023-03-24 DIAGNOSIS — I739 Peripheral vascular disease, unspecified: Secondary | ICD-10-CM | POA: Diagnosis present

## 2023-03-24 DIAGNOSIS — G4730 Sleep apnea, unspecified: Secondary | ICD-10-CM | POA: Diagnosis present

## 2023-03-24 DIAGNOSIS — Z79899 Other long term (current) drug therapy: Secondary | ICD-10-CM | POA: Insufficient documentation

## 2023-03-24 DIAGNOSIS — R9389 Abnormal findings on diagnostic imaging of other specified body structures: Secondary | ICD-10-CM | POA: Diagnosis not present

## 2023-03-24 DIAGNOSIS — I132 Hypertensive heart and chronic kidney disease with heart failure and with stage 5 chronic kidney disease, or end stage renal disease: Secondary | ICD-10-CM | POA: Insufficient documentation

## 2023-03-24 DIAGNOSIS — I5022 Chronic systolic (congestive) heart failure: Secondary | ICD-10-CM | POA: Insufficient documentation

## 2023-03-24 DIAGNOSIS — I10 Essential (primary) hypertension: Secondary | ICD-10-CM | POA: Diagnosis present

## 2023-03-24 DIAGNOSIS — I120 Hypertensive chronic kidney disease with stage 5 chronic kidney disease or end stage renal disease: Secondary | ICD-10-CM | POA: Diagnosis not present

## 2023-03-24 DIAGNOSIS — E875 Hyperkalemia: Principal | ICD-10-CM | POA: Diagnosis present

## 2023-03-24 DIAGNOSIS — J449 Chronic obstructive pulmonary disease, unspecified: Secondary | ICD-10-CM | POA: Insufficient documentation

## 2023-03-24 DIAGNOSIS — D649 Anemia, unspecified: Secondary | ICD-10-CM | POA: Insufficient documentation

## 2023-03-24 DIAGNOSIS — R110 Nausea: Secondary | ICD-10-CM | POA: Diagnosis not present

## 2023-03-24 DIAGNOSIS — Z951 Presence of aortocoronary bypass graft: Secondary | ICD-10-CM | POA: Diagnosis not present

## 2023-03-24 DIAGNOSIS — Z794 Long term (current) use of insulin: Secondary | ICD-10-CM | POA: Insufficient documentation

## 2023-03-24 DIAGNOSIS — R42 Dizziness and giddiness: Secondary | ICD-10-CM | POA: Diagnosis not present

## 2023-03-24 DIAGNOSIS — E119 Type 2 diabetes mellitus without complications: Secondary | ICD-10-CM

## 2023-03-24 DIAGNOSIS — Z7982 Long term (current) use of aspirin: Secondary | ICD-10-CM | POA: Diagnosis not present

## 2023-03-24 DIAGNOSIS — I2510 Atherosclerotic heart disease of native coronary artery without angina pectoris: Secondary | ICD-10-CM | POA: Insufficient documentation

## 2023-03-24 DIAGNOSIS — I959 Hypotension, unspecified: Secondary | ICD-10-CM | POA: Diagnosis not present

## 2023-03-24 DIAGNOSIS — K2100 Gastro-esophageal reflux disease with esophagitis, without bleeding: Secondary | ICD-10-CM | POA: Diagnosis present

## 2023-03-24 DIAGNOSIS — I491 Atrial premature depolarization: Secondary | ICD-10-CM | POA: Diagnosis not present

## 2023-03-24 DIAGNOSIS — R918 Other nonspecific abnormal finding of lung field: Secondary | ICD-10-CM | POA: Diagnosis not present

## 2023-03-24 LAB — COMPREHENSIVE METABOLIC PANEL
ALT: 17 U/L (ref 0–44)
AST: 18 U/L (ref 15–41)
Albumin: 3.1 g/dL — ABNORMAL LOW (ref 3.5–5.0)
Alkaline Phosphatase: 51 U/L (ref 38–126)
Anion gap: 11 (ref 5–15)
BUN: 79 mg/dL — ABNORMAL HIGH (ref 8–23)
CO2: 20 mmol/L — ABNORMAL LOW (ref 22–32)
Calcium: 7.9 mg/dL — ABNORMAL LOW (ref 8.9–10.3)
Chloride: 101 mmol/L (ref 98–111)
Creatinine, Ser: 5.84 mg/dL — ABNORMAL HIGH (ref 0.61–1.24)
GFR, Estimated: 10 mL/min — ABNORMAL LOW (ref >60)
Glucose, Bld: 153 mg/dL — ABNORMAL HIGH (ref 70–99)
Potassium: 6 mmol/L — ABNORMAL HIGH (ref 3.5–5.1)
Sodium: 132 mmol/L — ABNORMAL LOW (ref 135–145)
Total Bilirubin: 0.9 mg/dL (ref 0.3–1.2)
Total Protein: 6.1 g/dL — ABNORMAL LOW (ref 6.5–8.1)

## 2023-03-24 LAB — CBC WITH DIFFERENTIAL/PLATELET
Abs Immature Granulocytes: 0.05 10*3/uL (ref 0.00–0.07)
Basophils Absolute: 0.1 10*3/uL (ref 0.0–0.1)
Basophils Relative: 1 %
Eosinophils Absolute: 0.4 10*3/uL (ref 0.0–0.5)
Eosinophils Relative: 4 %
HCT: 30.3 % — ABNORMAL LOW (ref 39.0–52.0)
Hemoglobin: 10.3 g/dL — ABNORMAL LOW (ref 13.0–17.0)
Immature Granulocytes: 1 %
Lymphocytes Relative: 19 %
Lymphs Abs: 1.7 10*3/uL (ref 0.7–4.0)
MCH: 31.4 pg (ref 26.0–34.0)
MCHC: 34 g/dL (ref 30.0–36.0)
MCV: 92.4 fL (ref 80.0–100.0)
Monocytes Absolute: 0.8 10*3/uL (ref 0.1–1.0)
Monocytes Relative: 8 %
Neutro Abs: 6.2 10*3/uL (ref 1.7–7.7)
Neutrophils Relative %: 67 %
Platelets: 237 10*3/uL (ref 150–400)
RBC: 3.28 MIL/uL — ABNORMAL LOW (ref 4.22–5.81)
RDW: 14.7 % (ref 11.5–15.5)
WBC: 9.3 10*3/uL (ref 4.0–10.5)
nRBC: 0 % (ref 0.0–0.2)

## 2023-03-24 LAB — TROPONIN I (HIGH SENSITIVITY)
Troponin I (High Sensitivity): 33 ng/L — ABNORMAL HIGH (ref <18)
Troponin I (High Sensitivity): 31 ng/L — ABNORMAL HIGH (ref <18)

## 2023-03-24 LAB — GLUCOSE, CAPILLARY: Glucose-Capillary: 187 mg/dL — ABNORMAL HIGH (ref 70–99)

## 2023-03-24 LAB — PHOSPHORUS: Phosphorus: 6.9 mg/dL — ABNORMAL HIGH (ref 2.5–4.6)

## 2023-03-24 LAB — CBG MONITORING, ED: Glucose-Capillary: 190 mg/dL — ABNORMAL HIGH (ref 70–99)

## 2023-03-24 LAB — TSH: TSH: 4.006 u[IU]/mL (ref 0.350–4.500)

## 2023-03-24 LAB — MAGNESIUM: Magnesium: 2 mg/dL (ref 1.7–2.4)

## 2023-03-24 MED ORDER — EZETIMIBE 10 MG PO TABS
10.0000 mg | ORAL_TABLET | Freq: Every day | ORAL | Status: DC
Start: 2023-03-24 — End: 2023-03-25
  Administered 2023-03-25: 10 mg via ORAL
  Filled 2023-03-24: qty 1

## 2023-03-24 MED ORDER — STERILE WATER FOR INJECTION IV SOLN
Freq: Once | INTRAVENOUS | Status: AC
  Filled 2023-03-24: qty 150

## 2023-03-24 MED ORDER — LOSARTAN POTASSIUM 50 MG PO TABS: 50.0000 mg | ORAL_TABLET | Freq: Every day | ORAL | Status: DC

## 2023-03-24 MED ORDER — ONDANSETRON HCL 4 MG PO TABS: 4.0000 mg | ORAL_TABLET | Freq: Four times a day (QID) | ORAL | Status: DC | PRN

## 2023-03-24 MED ORDER — CALCIUM GLUCONATE 10 % IV SOLN
1.0000 g | Freq: Once | INTRAVENOUS | Status: AC
  Administered 2023-03-24: 1 g via INTRAVENOUS
  Filled 2023-03-24: qty 10

## 2023-03-24 MED ORDER — ACETAMINOPHEN 650 MG RE SUPP: 650.0000 mg | Freq: Four times a day (QID) | RECTAL | Status: DC | PRN

## 2023-03-24 MED ORDER — DELFLEX-LC/1.5% DEXTROSE 344 MOSM/L IP SOLN
Freq: Once | INTRAPERITONEAL | Status: AC
  Administered 2023-03-24: 3000 mL via INTRAPERITONEAL
  Filled 2023-03-24: qty 3000

## 2023-03-24 MED ORDER — AMLODIPINE BESYLATE 5 MG PO TABS: 5.0000 mg | ORAL_TABLET | Freq: Every day | ORAL | Status: DC

## 2023-03-24 MED ORDER — INSULIN GLARGINE-YFGN 100 UNIT/ML ~~LOC~~ SOLN
48.0000 [IU] | Freq: Every day | SUBCUTANEOUS | Status: DC
  Filled 2023-03-24: qty 0.48

## 2023-03-24 MED ORDER — INSULIN ASPART 100 UNIT/ML IJ SOLN
0.0000 [IU] | Freq: Every day | INTRAMUSCULAR | Status: DC
  Filled 2023-03-24: qty 1

## 2023-03-24 MED ORDER — NORTRIPTYLINE HCL 25 MG PO CAPS
50.0000 mg | ORAL_CAPSULE | Freq: Every day | ORAL | Status: DC
  Administered 2023-03-24: 50 mg via ORAL
  Filled 2023-03-24: qty 2

## 2023-03-24 MED ORDER — INSULIN GLARGINE (2 UNIT DIAL) 300 UNIT/ML ~~LOC~~ SOPN: 48.0000 [IU] | PEN_INJECTOR | Freq: Every day | SUBCUTANEOUS | Status: DC

## 2023-03-24 MED ORDER — TORSEMIDE 20 MG PO TABS
20.0000 mg | ORAL_TABLET | Freq: Two times a day (BID) | ORAL | Status: DC
  Administered 2023-03-25: 20 mg via ORAL
  Filled 2023-03-24: qty 1

## 2023-03-24 MED ORDER — INSULIN ASPART 100 UNIT/ML IJ SOLN
0.0000 [IU] | Freq: Three times a day (TID) | INTRAMUSCULAR | Status: DC
  Administered 2023-03-24: 1 [IU] via SUBCUTANEOUS
  Filled 2023-03-24: qty 1

## 2023-03-24 MED ORDER — ASPIRIN 81 MG PO TBEC
81.0000 mg | DELAYED_RELEASE_TABLET | Freq: Every day | ORAL | Status: DC
  Administered 2023-03-25: 81 mg via ORAL
  Filled 2023-03-24: qty 1

## 2023-03-24 MED ORDER — SENNOSIDES-DOCUSATE SODIUM 8.6-50 MG PO TABS: 1.0000 | ORAL_TABLET | Freq: Every evening | ORAL | Status: DC | PRN

## 2023-03-24 MED ORDER — ACETAMINOPHEN 325 MG PO TABS: 650.0000 mg | ORAL_TABLET | Freq: Four times a day (QID) | ORAL | Status: DC | PRN

## 2023-03-24 MED ORDER — ONDANSETRON HCL 4 MG/2ML IJ SOLN: 4.0000 mg | Freq: Four times a day (QID) | INTRAMUSCULAR | Status: DC | PRN

## 2023-03-24 MED ORDER — GENTAMICIN SULFATE 0.1 % EX CREA
1.0000 | TOPICAL_CREAM | Freq: Every day | CUTANEOUS | Status: DC
Start: 2023-03-24 — End: 2023-03-25
  Administered 2023-03-24 – 2023-03-25 (×2): 1 via TOPICAL
  Filled 2023-03-24: qty 15

## 2023-03-24 MED ORDER — HYDRALAZINE HCL 20 MG/ML IJ SOLN
5.0000 mg | Freq: Three times a day (TID) | INTRAMUSCULAR | Status: DC | PRN
Start: 2023-03-24 — End: 2023-03-26

## 2023-03-24 NOTE — Hospital Course (Signed)
[name] is a 62 year old male with history of CAD status post quadruple bypass in 2018, hypertension, hyperlipidemia, GERD, anemia, end-stage renal disease on peritoneal dialysis, who presents emergency department for chief concerns of dizziness.  Vitals in the ED showed temperature of 97.6, respiration rate 18, heart rate of 44, initial blood pressure recorded at 89/60 improved to 149/75, SpO2 of 96% on room air.  Serum sodium is 132, potassium three 6.0, chloride 101, bicarb 20, BUN of 79, serum creatinine of 5.84, EGFR of 10, nonfasting blood glucose 153, WBC 9.3, hemoglobin 10.3, platelets of 237.  High sensitive troponin was 33.  ED treatment: Calcium gluconate 1 g IV one-time dose, sodium bicarbonate 150 mill equivalent infusion.  EDP consulted with [facility] cardiology, Dr. Kirke Corin and nephrology, Dr. Wynelle Link.

## 2023-03-24 NOTE — Assessment & Plan Note (Addendum)
Status post calcium gluconate 1 g IV one-time dose, sodium bicarbonate at 150 mEq in sterile water 1150 mL at a rate of 125 mL/h, 1 bag ordered Holding home losartan 50 mg daily, a.m. team to resume when the benefits outweigh the risk Recheck BMP in the a.m.

## 2023-03-24 NOTE — Assessment & Plan Note (Signed)
Status post quadruple therapy in 2018

## 2023-03-24 NOTE — H&P (Signed)
History and Physical   Hussien Biba ZOX:[id number] DOB: [birth year] DOA: 03/24/2023  PCP: Sallyanne Kuster, NP  Outpatient Specialists: Dr. Mariah Milling, cardiology Patient coming from: Home via EMS  I have personally briefly reviewed patient's old medical records in [facility] EMR.  Chief Concern: Dizziness  HPI: Mr. Kiev Sonier is a 62 year old male with history of CAD status post quadruple bypass in 2018, hypertension, hyperlipidemia, GERD, anemia, end-stage renal disease on peritoneal dialysis, who presents emergency department for chief concerns of dizziness.  Vitals in the ED showed temperature of 97.6, respiration rate 18, heart rate of 44, initial blood pressure recorded at 89/60 improved to 149/75, SpO2 of 96% on room air.  Serum sodium is 132, potassium three 6.0, chloride 101, bicarb 20, BUN of 79, serum creatinine of 5.84, EGFR of 10, nonfasting blood glucose 153, WBC 9.3, hemoglobin 10.3, platelets of 237.  High sensitive troponin was 33.  ED treatment: Calcium gluconate 1 g IV one-time dose, sodium bicarbonate 150 mill equivalent infusion.  EDP consulted with [facility] cardiology, Dr. Kirke Corin and nephrology, Dr. Wynelle Link. ------------------------------------- At bedside, patient is able to tell me his  name, age, current year.   He reports that he developed dizziness this morning after washing is face and brushing his teeth. He had not had breakfast yet.   He denies recent trauma to his person. He reports this feels different when he had open heart surgery in 2018. In 2018, he felt extreme exhaustion at the time.   He has not felt this way. He denies fever, cough, vomiting, shortness of breath, chest pain. He endorses nausea this AM. He denies changes to urination, dysuria, hematuria, diarrhea. He endorses constipation, last BM was last evening, dark brown, which is normal.   Social history: He lives at home with his wife. He is a tobacco user, smoking 1 cigarettes per day.  Prior to 2018, he smoked 2 ppd. He infrequently consumes etoh. He denies recreational drug use. 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  ROS: Constitutional: no weight change, no fever ENT/Mouth: no sore throat, no rhinorrhea Eyes: no eye pain, no vision changes Cardiovascular: no chest pain, no dyspnea,  no edema, no palpitations Respiratory: no cough, no sputum, no wheezing Gastrointestinal: no nausea, no vomiting, no diarrhea, + constipation Genitourinary: no urinary incontinence, no dysuria, no hematuria Musculoskeletal: no arthralgias, no myalgias Skin: no skin lesions, no pruritus, Neuro: + weakness, no loss of consciousness, no syncope, + dizziness Psych: no anxiety, no depression, + decrease appetite Heme/Lymph: no bruising, no bleeding  ED Course: Discussed with emergency medicine provider, patient requiring hospitalization for chief concerns of hyperkalemia.  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 team to resume when the benefits outweigh the risk Recheck BMP in the a.m.  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 I suspect patient's new anemia level is secondary to chronic kidney disease progressing to end-stage renal disease requiring peritoneal dialysis Recheck CBC in the a.m.  Hx of CABG Status post quadruple therapy in 2018  Peritoneal dialysis catheter in place [facility]) Nephrology has been consulted  Sleep apnea CPAP was initially ordered, patient declined  Essential hypertension Amlodipine 5 mg daily resumed on admission Home losartan not resumed on admission due to hyperkalemia, a.m. team to resume when the benefits outweigh the risk Home torsemide 20 mg p.o. twice daily resumed for 03/25/2023 Hydralazine 5 mg IV every 8 hours as needed for SBP greater than 180, 2 days ordered  Chart reviewed.   DVT prophylaxis: TED hose; AM team to initiate pharmacologic DVT prophylaxis when the benefits outweigh the risk Code Status:  Diet: Renal/carb Family Communication: a phone call has been offered, patient decline Disposition Plan: Pending clinical course Consults called: Nephrology and cardiology via EDP Admission status: Telemetry cardiac, inpatient  Past Medical History:  Diagnosis Date   Anemia    Arthritis    CAD (coronary artery disease)    a. 12/2016 s/p CABG x 4 (LIMA->LAD, VG->RCA, VG->OM1, VG->D1); b. 02/2018 MV: EF 35%, small-med inferolaterlal infarct. No ischemia.   CKD (chronic kidney disease), stage IV [facility])    Peritoneal Dialysis   Colon polyps    COPD (chronic obstructive pulmonary disease) (HCC)    Dupre's syndrome    GERD (gastroesophageal reflux disease)    HFrEF (heart failure reduced ejection fraction) (HCC)    a.) 2018 EF 35%; b.) 02/2018 EF 50%; c). 02/2019 EF 40-45%; d.) 08/2019 Echo: EF 50-55%, no rwma, GrII DD; e.) 04/2020  Echo: EF 30-35%, mod-sev glob HK. Mild LVH. G2DD. Low-nl RV fxn. Mildly dil LA. Mild MR. Mild-mod Ao sclerosis w/o stenosis; f.) TTE 02/09/2021: EF 55%; septal HK, LAE; G2DD.   History of 2019 novel coronavirus disease (COVID-19) 02/08/2021   Hyperlipidemia    Hypertension    Ischemic cardiomyopathy    a.) 02/2018 MV: EF 35%; b.) 08/2019 Echo: EF 50-55%; c.) 04/2020 Echo: EF 30-35%; d.) TTE 02/09/2021: EF 50-55%.   Lymphedema    Migraine    Myocardial infarction [facility])    Neuropathy    PAD (peripheral artery disease) (HCC)    a. 04/2017 Aortoiliac duplex: R iliac dzs; b. 03/2020 LE Duplex: mild bilat atherosclerosis throughout. Patent vessels.   Pneumonia    Retinopathy    S/P CABG x 4    a.)  4v CABG: LIMA->LAD, SVG->RCA, SVG->OM1, SVG->D1   Sleep apnea    a.) does not require nocturnal PAP therapy   Statin intolerance    Stomach ulcer    T2DM (type 2 diabetes mellitus) (HCC)    Past Surgical History:  Procedure Laterality Date   bone graft surgery Bilateral    on both feet x 2   CAPD INSERTION N/A 11/13/2021   Procedure: LAPAROSCOPIC INSERTION CONTINUOUS AMBULATORY PERITONEAL DIALYSIS  (CAPD) CATHETER;  Surgeon: Leafy Ro, MD;  Location: [facility] ORS;  Service: General;  Laterality: N/A;   CAPD REVISION N/A 01/10/2022   Procedure: LAPAROSCOPIC REVISION CONTINUOUS AMBULATORY PERITONEAL DIALYSIS  (CAPD) CATHETER;  Surgeon: Leafy Ro, MD;  Location: [facility] ORS;  Service: General;  Laterality: N/A;   CARDIAC CATHETERIZATION  2013   S/p PCI    CARDIAC CATHETERIZATION  2018   S/p CABG   CATARACT EXTRACTION Bilateral    COLONOSCOPY     COLONOSCOPY WITH PROPOFOL N/A 10/17/2022   Procedure: COLONOSCOPY WITH PROPOFOL;  Surgeon: [name], MD;  Location: [facility] ENDOSCOPY;  Service: Endoscopy;  Laterality: N/A;   CORONARY ARTERY BYPASS GRAFT  2018   (LIMA-LAD,VG-RCA,VG-OM1,VG-D1)   ESOPHAGOGASTRODUODENOSCOPY (EGD) WITH PROPOFOL N/A 04/04/2020   Procedure: ESOPHAGOGASTRODUODENOSCOPY  (EGD) WITH PROPOFOL;  Surgeon: [name], MD;  Location: [facility] ENDOSCOPY;  Service: Endoscopy;  Laterality: N/A;   ESOPHAGOGASTRODUODENOSCOPY (EGD) WITH PROPOFOL N/A 06/06/2020   Procedure: ESOPHAGOGASTRODUODENOSCOPY (EGD) WITH PROPOFOL;  Surgeon: [name], MD;  Location: [facility] ENDOSCOPY;  Service: Endoscopy;  Laterality: N/A;   ESOPHAGOGASTRODUODENOSCOPY (EGD) WITH PROPOFOL N/A 09/24/2022   Procedure: ESOPHAGOGASTRODUODENOSCOPY (EGD) WITH PROPOFOL;  Surgeon: [name], MD;  Location: [facility] ENDOSCOPY;  Service: Endoscopy;  Laterality: N/A;   PERIPHERAL VASCULAR CATHETERIZATION Right 10/28/2016   PTA/DEB Right SFA   WRIST SURGERY Left    cyst   Social History:  reports that he has quit smoking. His smoking use included cigarettes. He has a 9.8 pack-year smoking history. He has been exposed to tobacco smoke. He has never used smokeless tobacco. He reports that he does not currently use alcohol. He reports that he does not use drugs.  Allergies  Allergen Reactions   Iodinated Contrast Media Other (See Comments)    Kidney disease  Kidney disease  Kidney disease  Kidney disease   Other Other (See Comments)    Pain  With joint stiffness     Statins Other (See Comments)    Pain  With joint stiffness  Pain  With joint stiffness    Pregabalin Other (See Comments)    Generalized aches and pains Generalized aches and pains Generalized aches and pains Generalized aches and pains Generalized aches and pains   Sacubitril-Valsartan Other (See Comments)    Hyperkalemia  Hyperkalemia Hyperkalemia  Hyperkalemia   Atorvastatin Other (See Comments)    Muscle aches Muscle aches   Pravastatin Other (See Comments)    Muscle aches Muscle aches   Rosuvastatin Other (See Comments)    Muscle Aches Other reaction(s): JOINT PAIN Other reaction(s): JOINT PAIN Muscle Aches    Family History  Problem Relation Age of Onset   Diabetes Mother    Heart disease Father    Family history: Family  history reviewed and not pertinent.  Prior to Admission medications   Medication Sig Start Date End Date Taking? Authorizing Provider  calcium acetate (PHOSLO) 667 MG capsule Take 1,334 mg by mouth 3 (three) times daily. 03/19/23  Yes [provider]  amLODipine (NORVASC) 5 MG tablet Take 1 tablet (5 mg total) by mouth daily. 03/07/23 06/05/23  Carlos Levering, NP  aspirin EC 81 MG tablet Take 81 mg by mouth daily. Swallow whole.    [provider]  carvedilol (COREG) 12.5 MG tablet TAKE 1 AND 1/2 TABLETS BY MOUTH TWICE DAILY 11/14/22   Antonieta Iba, MD  cholecalciferol (VITAMIN D3) 25 MCG (1000 UNIT) tablet Take 2,000 Units by mouth 2 (two) times daily.    [provider]  cloNIDine (CATAPRES) 0.1 MG tablet Take 0.1 mg by mouth 3 (three) times daily.    [provider]  docusate calcium (SURFAK) 240 MG capsule Take 240 mg by mouth daily.  [provider]  Evolocumab (REPATHA SURECLICK) 140 MG/ML SOAJ INJECT 140 MG UNDER THE SKIN EVERY 14 DAYS 09/27/22   Antonieta Iba, MD  ezetimibe (ZETIA) 10 MG tablet TAKE 1 TABLET(10 MG) BY MOUTH DAILY 06/20/22   Antonieta Iba, MD  insulin lispro (HUMALOG) 100 UNIT/ML KwikPen Inject into the skin.    [provider]  Insulin Pen Needle (B-D ULTRAFINE III SHORT PEN) 31G X 8 MM MISC To use with pen basal and mealtime coverage insulin pens 5 times daily. DX: E11.65 04/17/20   Carlean Jews, NP  isosorbide mononitrate (IMDUR) 30 MG 24 hr tablet Take 1 tablet (30 mg total) by mouth 2 (two) times daily. 07/18/22   Antonieta Iba, MD  levofloxacin (LEVAQUIN) 750 MG tablet SMARTSIG:1 Tablet(s) By Mouth Patient not taking: Reported on 03/07/2023 09/18/22   [provider]  losartan (COZAAR) 100 MG tablet Take 100 mg by mouth daily. 10/23/22   [provider]  losartan (COZAAR) 25 MG tablet Take 25 mg by mouth every evening. Patient not taking: Reported on 03/07/2023 08/28/21    [provider]  nortriptyline (PAMELOR) 10 MG capsule Take 50 mg by mouth at bedtime.    [provider]  NOVOLOG FLEXPEN 100 UNIT/ML FlexPen Inject into the skin 3 (three) times daily with meals. 3 x 's daily with meals - SSI 05/22/21   [provider]  pantoprazole (PROTONIX) 40 MG tablet TAKE 1 TABLET(40 MG) BY MOUTH TWICE DAILY BEFORE A MEAL 02/13/23   Sallyanne Kuster, NP  polyethylene glycol-electrolytes (NULYTELY) 420 g solution 5pm two nights before procedure Mix and drink 8oz every 20 mins until half of is gone rthen 5pm the night before procedure drink 8oz every 20 mins until gone 5 hours morning of procedure mix and drink 8oz every 15 mins until 1/2 of 2nd jug is gone Patient not taking: Reported on 03/07/2023 09/26/22   [name], MD  Ruxolitinib Phosphate (OPZELURA) 1.5 % CREA Apply to skin daily, legs Patient taking differently: Apply 1 Application topically daily. Apply to skin daily, legs 09/12/21   Deirdre Evener, MD  torsemide (DEMADEX) 20 MG tablet Take 1 tablet (20 mg total) by mouth 2 (two) times daily. 09/27/22   Gollan, Tollie Pizza, MD  TOUJEO MAX SOLOSTAR 300 UNIT/ML Solostar Pen 60 Units daily.    [provider]  TRESIBA FLEXTOUCH 200 UNIT/ML FlexTouch Pen 100 Units at bedtime. 08/12/21   [provider]   Physical Exam: Vitals:   03/24/23 1230 03/24/23 1245 03/24/23 1500 03/24/23 1700  BP: 125/89 (!) 144/73  (!) 146/80  Pulse: 79 (!) 51  (!) 52  Resp: 13 13  20   Temp:   98.2 F (36.8 C) 98.4 F (36.9 C)  TempSrc:   Oral Oral  SpO2: 93% 99%  100%  Weight:      Height:       Constitutional: appears frail, older than chronological age, NAD, calm Eyes: PERRL, lids and conjunctivae normal ENMT: Mucous membranes are moist. Posterior pharynx clear of any exudate or lesions. Age-appropriate dentition. Hearing appropriate Neck: normal, supple, no masses, no thyromegaly Respiratory: clear to auscultation bilaterally, no  wheezing, no crackles. Normal respiratory effort. No accessory muscle use.  Cardiovascular: Regular rate and rhythm, no murmurs / rubs / gallops. Trace bilateral lower extremity swelling. 2+ pedal pulses. No carotid bruits.  Abdomen: no tenderness, no masses palpated, no hepatosplenomegaly. Bowel sounds positive.  Musculoskeletal: no clubbing / cyanosis. No joint deformity upper  and lower extremities. Good ROM, no contractures, no atrophy. Normal muscle tone.  Skin: no rashes, lesions, ulcers. No induration.  Chronic skin discoloration of bilateral lower extremity, per patient this is baseline.   Neurologic: Sensation intact. Strength 5/5 in all 4.  Psychiatric: Normal judgment and insight. Alert and oriented x 3. Normal mood.   EKG: independently reviewed, showing bigeminy rhythm with rate of 69, QTc 543  Chest x-ray on Admission: I personally reviewed and I agree with radiologist reading as below.  DG Chest Portable 1 View  Result Date: 03/24/2023 CLINICAL DATA:  Dizziness.  Bradycardia. EXAM: PORTABLE CHEST 1 VIEW COMPARISON:  02/09/2021. FINDINGS: Elevated right hemidiaphragm again seen. There are probable atelectatic changes at the left lung base. Bilateral lung fields are otherwise clear. Right lateral costophrenic angle is clear. Apparent blunting of left lateral costophrenic angle, which may represent trace left pleural effusion versus superimposed lung parenchymal changes. No significant interval change. Stable cardio-mediastinal silhouette. No acute osseous abnormalities. The soft tissues are within normal limits. IMPRESSION: *No acute cardiopulmonary abnormality. Persistent blunting of left lateral costophrenic angle, which may represent trace left pleural effusion versus superimposed lung parenchymal changes. Electronically Signed   By: Jules Schick M.D.   On: 03/24/2023 14:15    Labs on Admission: I have personally reviewed following labs  CBC: Recent Labs  Lab 03/24/23 1138   WBC 9.3  NEUTROABS 6.2  HGB 10.3*  HCT 30.3*  MCV 92.4  PLT 237   Basic Metabolic Panel: Recent Labs  Lab 03/24/23 1138  NA 132*  K 6.0*  CL 101  CO2 20*  GLUCOSE 153*  BUN 79*  CREATININE 5.84*  CALCIUM 7.9*  MG 2.0  PHOS 6.9*   GFR: Estimated Creatinine Clearance: 14.6 mL/min (A) (by C-G formula based on SCr of 5.84 mg/dL (H)).  Liver Function Tests: Recent Labs  Lab 03/24/23 1138  AST 18  ALT 17  ALKPHOS 51  BILITOT 0.9  PROT 6.1*  ALBUMIN 3.1*   Thyroid Function Tests: Recent Labs    03/24/23 1138  TSH 4.006   Urine analysis:    Component Value Date/Time   COLORURINE YELLOW (A) 08/31/2020 1942   APPEARANCEUR CLEAR (A) 08/31/2020 1942   LABSPEC 1.012 08/31/2020 1942   PHURINE 7.0 08/31/2020 1942   GLUCOSEU >=500 (A) 08/31/2020 1942   HGBUR MODERATE (A) 08/31/2020 1942   BILIRUBINUR NEGATIVE 08/31/2020 1942   KETONESUR 5 (A) 08/31/2020 1942   PROTEINUR >=300 (A) 08/31/2020 1942   NITRITE NEGATIVE 08/31/2020 1942   LEUKOCYTESUR NEGATIVE 08/31/2020 1942   This document was prepared using Dragon Voice Recognition software and may include unintentional dictation errors.  Dr. Sedalia Muta [facility]  If 7PM-7AM, please contact overnight-coverage provider If 7AM-7PM, please contact day attending provider [email or web address]  03/24/2023, 6:42 PM

## 2023-03-24 NOTE — Assessment & Plan Note (Signed)
-   Nephrology has been consulted

## 2023-03-24 NOTE — ED Provider Notes (Signed)
[facility] Provider Note    Event Date/Time   First MD Initiated Contact with Patient 03/24/23 1142     (approximate)   History   Chief Complaint: Dizziness   HPI  [name] [name] is a 62 y.o. male with a history of COPD diabetes hypertension ESRD on peritoneal dialysis who comes the ED complaining of dizziness that started this morning.  Denies chest pain or shortness of breath, no vomiting or diarrhea.  Been doing peritoneal dialysis at home per his usual routine.  Feels "lousy" but denies specific pain     Physical Exam   Triage Vital Signs: ED Triage Vitals  Encounter Vitals Group     BP 03/24/23 1130 122/79     Systolic BP Percentile --      Diastolic BP Percentile --      Pulse Rate 03/24/23 1130 (!) 47     Resp 03/24/23 1130 20     Temp 03/24/23 1130 97.6 F (36.4 C)     Temp Source 03/24/23 1130 Oral     SpO2 03/24/23 1126 96 %     Weight 03/24/23 1131 192 lb 0.3 oz (87.1 kg)     Height 03/24/23 1131 6\' 1"  (1.854 m)     Head Circumference --      Peak Flow --      Pain Score 03/24/23 1131 0     Pain Loc --      Pain Education --      Exclude from Growth Chart --     Most recent vital signs: Vitals:   03/24/23 1230 03/24/23 1245  BP: 125/89 (!) 144/73  Pulse: 79 (!) 51  Resp: 13 13  Temp:    SpO2: 93% 99%    General: Awake, no distress.  CV:  Good peripheral perfusion.  Bradycardia, heart rate 40 on the monitor in bigeminy.  Symmetric distal pulses Resp:  Normal effort.  Clear to auscultation bilaterally Abd:  No distention.  Soft nontender Other:  No lower extremity edema.   ED Results / Procedures / Treatments   Labs (all labs ordered are listed, but only abnormal results are displayed) Labs Reviewed  COMPREHENSIVE METABOLIC PANEL - Abnormal; Notable for the following components:      Result Value   Sodium 132 (*)    Potassium 6.0 (*)    CO2 20 (*)    Glucose, Bld 153 (*)    BUN 79 (*)    Creatinine, Ser 5.84  (*)    Calcium 7.9 (*)    Total Protein 6.1 (*)    Albumin 3.1 (*)    GFR, Estimated 10 (*)    All other components within normal limits  CBC WITH DIFFERENTIAL/PLATELET - Abnormal; Notable for the following components:   RBC 3.28 (*)    Hemoglobin 10.3 (*)    HCT 30.3 (*)    All other components within normal limits  PHOSPHORUS - Abnormal; Notable for the following components:   Phosphorus 6.9 (*)    All other components within normal limits  TROPONIN I (HIGH SENSITIVITY) - Abnormal; Notable for the following components:   Troponin I (High Sensitivity) 33 (*)    All other components within normal limits  MAGNESIUM  TSH  HIV ANTIBODY (ROUTINE TESTING W REFLEX)  TROPONIN I (HIGH SENSITIVITY)     EKG Interpreted by me Sinus bradycardia, sinus rate of 40 with PVCs and bigeminy pattern.  Normal axis, prolonged QTc.  Normal QRS ST segments and T  waves.   RADIOLOGY Chest x-ray interpreted by me, no pneumothorax effusion or edema.  Radiology report reviewed   PROCEDURES:  .Critical Care  Performed by: Sharman Cheek, MD Authorized by: Sharman Cheek, MD   Critical care provider statement:    Critical care time (minutes):  35   Critical care time was exclusive of:  Separately billable procedures and treating other patients   Critical care was necessary to treat or prevent imminent or life-threatening deterioration of the following conditions:  Cardiac failure and metabolic crisis   Critical care was time spent personally by me on the following activities:  Development of treatment plan with patient or surrogate, discussions with consultants, evaluation of patient's response to treatment, examination of patient, obtaining history from patient or surrogate, ordering and performing treatments and interventions, ordering and review of laboratory studies, ordering and review of radiographic studies, pulse oximetry, re-evaluation of patient's condition and review of old charts    Care discussed with: admitting provider      MEDICATIONS ORDERED IN ED: Medications  sodium bicarbonate 150 mEq in sterile water 1,150 mL infusion ( Intravenous New Bag/Given 03/24/23 1354)  acetaminophen (TYLENOL) tablet 650 mg (has no administration in time range)    Or  acetaminophen (TYLENOL) suppository 650 mg (has no administration in time range)  ondansetron (ZOFRAN) tablet 4 mg (has no administration in time range)    Or  ondansetron (ZOFRAN) injection 4 mg (has no administration in time range)  senna-docusate (Senokot-S) tablet 1 tablet (has no administration in time range)  calcium gluconate inj 10% (1 g) URGENT USE ONLY! (1 g Intravenous Given 03/24/23 1153)     IMPRESSION / MDM / ASSESSMENT AND PLAN / ED COURSE  I reviewed the triage vital signs and the nursing notes.  DDx: Non-STEMI, hypothyroidism, electrolyte abnormality, anemia, medication toxicity, sinus bradycardia  Patient's presentation is most consistent with acute presentation with potential threat to life or bodily function.  Patient presents with symptomatic bradycardia, sinus rate of 20-30.  Blood pressure is okay.  Labs reveal potassium of 6.0.  Patient has received calcium gluconate.  Will start on bicarb infusion.  Discussed with nephrology Dr. Wynelle Link who agrees.  Discussed with hospitalist for further management.       FINAL CLINICAL IMPRESSION(S) / ED DIAGNOSES   Final diagnoses:  Symptomatic bradycardia  ESRD on peritoneal dialysis (HCC)  Type 2 diabetes mellitus without complication, with long-term current use of insulin (HCC)     Rx / DC Orders   ED Discharge Orders     None        Note:  This document was prepared using Dragon voice recognition software and may include unintentional dictation errors.   Sharman Cheek, MD 03/24/23 1409

## 2023-03-24 NOTE — Progress Notes (Addendum)
1645: Instilled 1.5L of Peritoneal fluid. Patient tolerated it well. Will let it dwell for 2 hours.  1945: PD output: 1.2L, clear yellow.  Dressings were changed and Gentamicin cream was applied.

## 2023-03-24 NOTE — Assessment & Plan Note (Addendum)
With new bigeminy; etiology workup in progress Complete echo ordered Home carvedilol not resumed on admission Strict I's and O's Cardiology has been consulted, appreciate recommendation Admit to telemetry cardiac, inpatient

## 2023-03-24 NOTE — Assessment & Plan Note (Signed)
CPAP was initially ordered, patient declined

## 2023-03-24 NOTE — Assessment & Plan Note (Addendum)
Amlodipine 5 mg daily resumed on admission Home losartan not resumed on admission due to hyperkalemia, a.m. team to resume when the benefits outweigh the risk Home torsemide 20 mg p.o. twice daily resumed for 03/25/2023 Hydralazine 5 mg IV every 8 hours as needed for SBP greater than 180, 2 days ordered

## 2023-03-24 NOTE — Progress Notes (Signed)
[facility]  ROUNDING NOTE   Subjective:   [name] was admitted to [facility] on 03/24/2023 for Hyperkalemia [E87.5]  Last peritoneal dialysis treatment was last night  Patient had dizziness, abdominal pain and found to feel lousy.   Objective:  Vital signs in last 24 hours:  Temp:  [97.6 F (36.4 C)] 97.6 F (36.4 C) (10/28 1130) Pulse Rate:  [31-79] 51 (10/28 1245) Resp:  [13-20] 13 (10/28 1245) BP: (89-144)/(60-89) 144/73 (10/28 1245) SpO2:  [78 %-100 %] 99 % (10/28 1245) Weight:  [87.1 kg] 87.1 kg (10/28 1131)  Weight change:  Filed Weights   03/24/23 1131  Weight: 87.1 kg    Intake/Output: No intake/output data recorded.   Intake/Output this shift:  No intake/output data recorded.  Physical Exam: General: NAD,   Head: Normocephalic, atraumatic. Moist oral mucosal membranes  Eyes: Anicteric, PERRL  Neck: Supple, trachea midline  Lungs:  Clear to auscultation  Heart: Bradycardia  Abdomen:  Soft, nontender,   Extremities:  no peripheral edema.  Neurologic: Nonfocal, moving all four extremities  Skin: No lesions  Access: PD catheter    Basic Metabolic Panel: Recent Labs  Lab 03/24/23 1138  NA 132*  K 6.0*  CL 101  CO2 20*  GLUCOSE 153*  BUN 79*  CREATININE 5.84*  CALCIUM 7.9*  MG 2.0  PHOS 6.9*    Liver Function Tests: Recent Labs  Lab 03/24/23 1138  AST 18  ALT 17  ALKPHOS 51  BILITOT 0.9  PROT 6.1*  ALBUMIN 3.1*   No results for input(s): "LIPASE", "AMYLASE" in the last 168 hours. No results for input(s): "AMMONIA" in the last 168 hours.  CBC: Recent Labs  Lab 03/24/23 1138  WBC 9.3  NEUTROABS 6.2  HGB 10.3*  HCT 30.3*  MCV 92.4  PLT 237    Cardiac Enzymes: No results for input(s): "CKTOTAL", "CKMB", "CKMBINDEX", "TROPONINI" in the last 168 hours.  BNP: Invalid input(s): "POCBNP"  CBG: No results for input(s): "GLUCAP" in the last 168 hours.  Microbiology: Results for orders placed or performed  during the hospital encounter of 02/08/21  Resp Panel by RT-PCR (Flu A&B, Covid) Nasopharyngeal Swab     Status: Abnormal   Collection Time: 02/08/21  4:34 PM   Specimen: Nasopharyngeal Swab; Nasopharyngeal(NP) swabs in vial transport medium  Result Value Ref Range Status   SARS Coronavirus 2 by RT PCR POSITIVE (A) NEGATIVE Final    Comment: RESULT CALLED TO, READ BACK BY AND VERIFIED WITH: JESS ANDERSON @1750  02/08/21 MJU (NOTE) SARS-CoV-2 target nucleic acids are DETECTED.  The SARS-CoV-2 RNA is generally detectable in upper respiratory specimens during the acute phase of infection. Positive results are indicative of the presence of the identified virus, but do not rule out bacterial infection or co-infection with other pathogens not detected by the test. Clinical correlation with patient history and other diagnostic information is necessary to determine patient infection status. The expected result is Negative.  Fact Sheet for Patients: BloggerCourse.com  Fact Sheet for Healthcare Providers: SeriousBroker.it  This test is not yet approved or cleared by the [place] FDA and  has been authorized for detection and/or diagnosis of SARS-CoV-2 by FDA under an Emergency Use Authorization (EUA).  This EUA will remain in effect (meaning this test can be u sed) for the duration of  the COVID-19 declaration under Section 564(b)(1) of the Act, 21 U.S.C. section 360bbb-3(b)(1), unless the authorization is terminated or revoked sooner.     Influenza A by PCR  NEGATIVE NEGATIVE Final   Influenza B by PCR NEGATIVE NEGATIVE Final    Comment: (NOTE) The Xpert Xpress SARS-CoV-2/FLU/RSV plus assay is intended as an aid in the diagnosis of influenza from Nasopharyngeal swab specimens and should not be used as a sole basis for treatment. Nasal washings and aspirates are unacceptable for Xpert Xpress SARS-CoV-2/FLU/RSV testing.  Fact Sheet  for Patients: BloggerCourse.com  Fact Sheet for Healthcare Providers: SeriousBroker.it  This test is not yet approved or cleared by the [place] FDA and has been authorized for detection and/or diagnosis of SARS-CoV-2 by FDA under an Emergency Use Authorization (EUA). This EUA will remain in effect (meaning this test can be used) for the duration of the COVID-19 declaration under Section 564(b)(1) of the Act, 21 U.S.C. section 360bbb-3(b)(1), unless the authorization is terminated or revoked.  Performed at [facility], [address] [place], [place] [address]     Coagulation Studies: No results for input(s): "LABPROT", "INR" in the last 72 hours.  Urinalysis: No results for input(s): "COLORURINE", "LABSPEC", "PHURINE", "GLUCOSEU", "HGBUR", "BILIRUBINUR", "KETONESUR", "PROTEINUR", "UROBILINOGEN", "NITRITE", "LEUKOCYTESUR" in the last 72 hours.  Invalid input(s): "APPERANCEUR"    Imaging: DG Chest Portable 1 View  Result Date: 03/24/2023 CLINICAL DATA:  Dizziness.  Bradycardia. EXAM: PORTABLE CHEST 1 VIEW COMPARISON:  02/09/2021. FINDINGS: Elevated right hemidiaphragm again seen. There are probable atelectatic changes at the left lung base. Bilateral lung fields are otherwise clear. Right lateral costophrenic angle is clear. Apparent blunting of left lateral costophrenic angle, which may represent trace left pleural effusion versus superimposed lung parenchymal changes. No significant interval change. Stable cardio-mediastinal silhouette. No acute osseous abnormalities. The soft tissues are within normal limits. IMPRESSION: *No acute cardiopulmonary abnormality. Persistent blunting of left lateral costophrenic angle, which may represent trace left pleural effusion versus superimposed lung parenchymal changes. Electronically Signed   By: Jules Schick M.D.   On: 03/24/2023 14:15     Medications:    sodium  bicarbonate 150 mEq in sterile water 1,150 mL infusion 125 mL/hr at 03/24/23 1354    insulin aspart  0-5 Units Subcutaneous QHS   insulin aspart  0-6 Units Subcutaneous TID WC   acetaminophen **OR** acetaminophen, hydrALAZINE, ondansetron **OR** ondansetron (ZOFRAN) IV, senna-docusate  Assessment/ Plan:  [name] is a 62 y.o.  male with end stage renal disease on peritoneal dialysis, hypertension, diabetes mellitus type II, diabetic retinopathy, COPD, coronary artery disease status post CABG, GERD/peptic ulcer disease, congestive heart failure, hyperlipidemia, peripheral arterial disease, who is admitted to [facility] on 03/24/2023 for Hyperkalemia [E87.5]  CAPD 2 exchanges 2.5 liter fills 91.5kg  End Stage Renal Disease with hyperkalemia on peritoneal dialysis. Not in volume overload.  - Manual exchange tonight 1.5% dextrose  Bradycardia and Hypotension: home regimen of amlodipine, clonidine, carvedilol, isosorbide mononitrate, torsemide and losartan. - hold clonidine and carvedilol - appreciate cardiology input    Anemia with chronic kidney disease: hemoglobin normocytic 10.3. Mircera as outpatient.   Secondary Hyperparathyroidism: with hyperphosphatemia.  - TUMS with meals - He states he is to start a new binder as an outpatient.   Diabetes mellitus type II with chronic kidney disease: insulin dependent.  - Continue glucose control.     LOS: 0 Dallas Torok 10/28/20242:40 PM

## 2023-03-24 NOTE — ED Triage Notes (Addendum)
Patient brought in by [facility] from home. Patient states about an hour ago he started feeling dizzy. Patient has an "extensive cardiac history". Patient also does dialysis at home. Patient has Hx of diabetes. Patient denies pain at this time. Patient is A&Ox4. HR is 39 in triage.

## 2023-03-24 NOTE — Assessment & Plan Note (Signed)
Patient was last hemoglobin check was on 02/11/2021, and it was 12.6. I suspect patient's new anemia level is secondary to chronic kidney disease progressing to end-stage renal disease requiring peritoneal dialysis Recheck CBC in the a.m.

## 2023-03-24 NOTE — Assessment & Plan Note (Signed)
-   Insulin SSI with at bedtime coverage, end-stage renal disease dosing ordered - Goal inpatient blood glucose levels 140-180

## 2023-03-25 ENCOUNTER — Inpatient Hospital Stay (HOSPITAL_BASED_OUTPATIENT_CLINIC_OR_DEPARTMENT_OTHER)
Admit: 2023-03-25 | Discharge: 2023-03-25 | Disposition: A | Discharge status: Home / Self Care | Payer: Medicare Other | Attending: Internal Medicine

## 2023-03-25 DIAGNOSIS — N186 End stage renal disease: Secondary | ICD-10-CM | POA: Diagnosis not present

## 2023-03-25 DIAGNOSIS — D631 Anemia in chronic kidney disease: Secondary | ICD-10-CM | POA: Diagnosis not present

## 2023-03-25 DIAGNOSIS — R0609 Other forms of dyspnea: Secondary | ICD-10-CM

## 2023-03-25 DIAGNOSIS — Z992 Dependence on renal dialysis: Secondary | ICD-10-CM | POA: Diagnosis not present

## 2023-03-25 DIAGNOSIS — R001 Bradycardia, unspecified: Secondary | ICD-10-CM | POA: Diagnosis not present

## 2023-03-25 DIAGNOSIS — I959 Hypotension, unspecified: Secondary | ICD-10-CM | POA: Diagnosis not present

## 2023-03-25 DIAGNOSIS — E1122 Type 2 diabetes mellitus with diabetic chronic kidney disease: Secondary | ICD-10-CM | POA: Diagnosis not present

## 2023-03-25 DIAGNOSIS — E875 Hyperkalemia: Secondary | ICD-10-CM | POA: Diagnosis not present

## 2023-03-25 LAB — CBC
HCT: 32.7 % — ABNORMAL LOW (ref 39.0–52.0)
Hemoglobin: 11.2 g/dL — ABNORMAL LOW (ref 13.0–17.0)
MCH: 30.9 pg (ref 26.0–34.0)
MCHC: 34.3 g/dL (ref 30.0–36.0)
MCV: 90.1 fL (ref 80.0–100.0)
Platelets: 232 10*3/uL (ref 150–400)
RBC: 3.63 MIL/uL — ABNORMAL LOW (ref 4.22–5.81)
RDW: 14.6 % (ref 11.5–15.5)
WBC: 9.1 10*3/uL (ref 4.0–10.5)
nRBC: 0 % (ref 0.0–0.2)

## 2023-03-25 LAB — BASIC METABOLIC PANEL
Anion gap: 10 (ref 5–15)
BUN: 80 mg/dL — ABNORMAL HIGH (ref 8–23)
CO2: 24 mmol/L (ref 22–32)
Calcium: 8.1 mg/dL — ABNORMAL LOW (ref 8.9–10.3)
Chloride: 100 mmol/L (ref 98–111)
Creatinine, Ser: 5.52 mg/dL — ABNORMAL HIGH (ref 0.61–1.24)
GFR, Estimated: 11 mL/min — ABNORMAL LOW (ref >60)
Glucose, Bld: 143 mg/dL — ABNORMAL HIGH (ref 70–99)
Potassium: 4.1 mmol/L (ref 3.5–5.1)
Sodium: 134 mmol/L — ABNORMAL LOW (ref 135–145)

## 2023-03-25 LAB — ECHOCARDIOGRAM COMPLETE
AR max vel: 1.91 cm2
AV Area VTI: 1.98 cm2
AV Area mean vel: 1.82 cm2
AV Mean grad: 7 mm[Hg]
AV Peak grad: 12.7 mm[Hg]
Ao pk vel: 1.78 m/s
Area-P 1/2: 3.95 cm2
Calc EF: 40.9 %
Height: 73 in
MV VTI: 2.08 cm2
S' Lateral: 4.3 cm
Single Plane A2C EF: 36.5 %
Single Plane A4C EF: 45 %
Weight: 3019.42 [oz_av]

## 2023-03-25 LAB — GLUCOSE, CAPILLARY
Glucose-Capillary: 91 mg/dL (ref 70–99)
Glucose-Capillary: 164 mg/dL — ABNORMAL HIGH (ref 70–99)

## 2023-03-25 LAB — HEMOGLOBIN A1C
Hgb A1c MFr Bld: 7.6 % — ABNORMAL HIGH (ref 4.8–5.6)
Mean Plasma Glucose: 171.42 mg/dL

## 2023-03-25 MED ORDER — HYDRALAZINE HCL 50 MG PO TABS: 50.0000 mg | ORAL_TABLET | Freq: Three times a day (TID) | ORAL | Status: DC

## 2023-03-25 MED ORDER — CHLORHEXIDINE GLUCONATE CLOTH 2 % EX PADS
6.0000 | MEDICATED_PAD | Freq: Every day | CUTANEOUS | Status: DC
Start: 2023-03-25 — End: 2023-03-25
  Administered 2023-03-25: 6 via TOPICAL

## 2023-03-25 MED ORDER — HYDRALAZINE HCL 25 MG PO TABS
25.0000 mg | ORAL_TABLET | Freq: Once | ORAL | Status: AC
  Administered 2023-03-25: 25 mg via ORAL
  Filled 2023-03-25: qty 1

## 2023-03-25 MED ORDER — AMLODIPINE BESYLATE 10 MG PO TABS
10.0000 mg | ORAL_TABLET | Freq: Every day | ORAL | Status: DC
  Administered 2023-03-25: 10 mg via ORAL
  Filled 2023-03-25: qty 1

## 2023-03-25 MED ORDER — ISOSORBIDE DINITRATE 10 MG PO TABS
10.0000 mg | ORAL_TABLET | Freq: Three times a day (TID) | ORAL | Status: DC
  Administered 2023-03-25: 10 mg via ORAL
  Filled 2023-03-25: qty 1

## 2023-03-25 MED ORDER — HYDRALAZINE HCL 50 MG PO TABS
50.0000 mg | ORAL_TABLET | Freq: Three times a day (TID) | ORAL | Status: DC
  Administered 2023-03-25: 50 mg via ORAL
  Filled 2023-03-25: qty 1

## 2023-03-25 MED ORDER — AMLODIPINE BESYLATE 10 MG PO TABS: 10.0000 mg | ORAL_TABLET | Freq: Every day | ORAL | 0 refills | Status: DC

## 2023-03-25 MED ORDER — ISOSORBIDE DINITRATE 10 MG PO TABS
10.0000 mg | ORAL_TABLET | Freq: Once | ORAL | Status: AC
  Administered 2023-03-25: 10 mg via ORAL
  Filled 2023-03-25: qty 1

## 2023-03-25 MED ORDER — HYDRALAZINE HCL 50 MG PO TABS: 50.0000 mg | ORAL_TABLET | Freq: Three times a day (TID) | ORAL | 0 refills | Status: DC

## 2023-03-25 MED ORDER — HYDRALAZINE HCL 25 MG PO TABS
25.0000 mg | ORAL_TABLET | Freq: Three times a day (TID) | ORAL | Status: DC
  Administered 2023-03-25: 25 mg via ORAL
  Filled 2023-03-25: qty 1

## 2023-03-25 MED ORDER — ISOSORBIDE DINITRATE 20 MG PO TABS: 20.0000 mg | ORAL_TABLET | Freq: Three times a day (TID) | ORAL | 0 refills | Status: DC

## 2023-03-25 MED ORDER — ISOSORBIDE DINITRATE 20 MG PO TABS
20.0000 mg | ORAL_TABLET | Freq: Three times a day (TID) | ORAL | Status: DC
  Filled 2023-03-25: qty 1

## 2023-03-25 NOTE — Plan of Care (Signed)
Progressing towards goals

## 2023-03-25 NOTE — Consult Note (Signed)
Cardiology Consultation   Patient ID: [name] [name] MRN: [id number]; DOB: [birth year]  Admit date: 03/24/2023 Date of Consult: 03/25/2023  PCP:  Sallyanne Kuster, NP   [facility] Providers Cardiologist:  Julien Nordmann, MD        Patient Profile:   [name] [name] is a 62 y.o. male with a hx of coronary artery disease status post CABG, chronic HFrEF with recovered ejection fraction, hypertension, hyperlipidemia, type 2 diabetes mellitus, and Torrez Renfroe-stage renal disease on peritoneal dialysis, who is being seen 03/25/2023 for the evaluation of bradycardia and PVCs at the request of Dr. Joylene Igo.  History of Present Illness:   [name] [name] was in his usual state of health until yesterday when he began to experience episodes of dizziness and generalized malaise.  The episodes would come and go, lasting 8 to 10 minutes at a time.  He tried to check his blood pressure at home but could not get any readings.  As the episodes were becoming more frequent, he came to the ER for further evaluation.  He had otherwise been feeling well, denying chest pain, shortness of breath, palpitations, and edema.  He had been compliant with his peritoneal dialysis and medications.  He notes that his blood pressure has been less well-controlled over the last month.  In the emergency department, the patient was found to be severely bradycardic with EKG showing marked sinus bradycardia with frequent PVCs in the pattern of bigeminy.  Labs revealed a potassium of 6.  He was given calcium gluconate and sodium bicarbonate.  Since coming to the floor, his rhythm has improved without any further bradycardia.   Past Medical History:  Diagnosis Date   Anemia    Arthritis    CAD (coronary artery disease)    a. 12/2016 s/p CABG x 4 (LIMA->LAD, VG->RCA, VG->OM1, VG->D1); b. 02/2018 MV: EF 35%, small-med inferolaterlal infarct. No ischemia.   CKD (chronic kidney disease), stage IV [facility])    Peritoneal Dialysis    Colon polyps    COPD (chronic obstructive pulmonary disease) (HCC)    Dupre's syndrome    GERD (gastroesophageal reflux disease)    HFrEF (heart failure reduced ejection fraction) (HCC)    a.) 2018 EF 35%; b.) 02/2018 EF 50%; c). 02/2019 EF 40-45%; d.) 08/2019 Echo: EF 50-55%, no rwma, GrII DD; e.) 04/2020 Echo: EF 30-35%, mod-sev glob HK. Mild LVH. G2DD. Low-nl RV fxn. Mildly dil LA. Mild MR. Mild-mod Ao sclerosis w/o stenosis; f.) TTE 02/09/2021: EF 55%; septal HK, LAE; G2DD.   History of 2019 novel coronavirus disease (COVID-19) 02/08/2021   Hyperlipidemia    Hypertension    Ischemic cardiomyopathy    a.) 02/2018 MV: EF 35%; b.) 08/2019 Echo: EF 50-55%; c.) 04/2020 Echo: EF 30-35%; d.) TTE 02/09/2021: EF 50-55%.   Lymphedema    Migraine    Myocardial infarction [facility])    Neuropathy    PAD (peripheral artery disease) (HCC)    a. 04/2017 Aortoiliac duplex: R iliac dzs; b. 03/2020 LE Duplex: mild bilat atherosclerosis throughout. Patent vessels.   Pneumonia    Retinopathy    S/P CABG x 4    a.)  4v CABG: LIMA->LAD, SVG->RCA, SVG->OM1, SVG->D1   Sleep apnea    a.) does not require nocturnal PAP therapy   Statin intolerance    Stomach ulcer    T2DM (type 2 diabetes mellitus) (HCC)     Past Surgical History:  Procedure Laterality Date   bone graft surgery Bilateral    on both feet  x 2   CAPD INSERTION N/A 11/13/2021   Procedure: LAPAROSCOPIC INSERTION CONTINUOUS AMBULATORY PERITONEAL DIALYSIS  (CAPD) CATHETER;  Surgeon: Leafy Ro, MD;  Location: [facility] ORS;  Service: General;  Laterality: N/A;   CAPD REVISION N/A 01/10/2022   Procedure: LAPAROSCOPIC REVISION CONTINUOUS AMBULATORY PERITONEAL DIALYSIS  (CAPD) CATHETER;  Surgeon: Leafy Ro, MD;  Location: [facility] ORS;  Service: General;  Laterality: N/A;   CARDIAC CATHETERIZATION  2013   S/p PCI    CARDIAC CATHETERIZATION  2018   S/p CABG   CATARACT EXTRACTION Bilateral    COLONOSCOPY     COLONOSCOPY WITH PROPOFOL N/A 10/17/2022    Procedure: COLONOSCOPY WITH PROPOFOL;  Surgeon: [name], MD;  Location: [facility] ENDOSCOPY;  Service: Endoscopy;  Laterality: N/A;   CORONARY ARTERY BYPASS GRAFT  2018   (LIMA-LAD,VG-RCA,VG-OM1,VG-D1)   ESOPHAGOGASTRODUODENOSCOPY (EGD) WITH PROPOFOL N/A 04/04/2020   Procedure: ESOPHAGOGASTRODUODENOSCOPY (EGD) WITH PROPOFOL;  Surgeon: [name], MD;  Location: [facility] ENDOSCOPY;  Service: Endoscopy;  Laterality: N/A;   ESOPHAGOGASTRODUODENOSCOPY (EGD) WITH PROPOFOL N/A 06/06/2020   Procedure: ESOPHAGOGASTRODUODENOSCOPY (EGD) WITH PROPOFOL;  Surgeon: [name], MD;  Location: [facility] ENDOSCOPY;  Service: Endoscopy;  Laterality: N/A;   ESOPHAGOGASTRODUODENOSCOPY (EGD) WITH PROPOFOL N/A 09/24/2022   Procedure: ESOPHAGOGASTRODUODENOSCOPY (EGD) WITH PROPOFOL;  Surgeon: [name], MD;  Location: [facility] ENDOSCOPY;  Service: Endoscopy;  Laterality: N/A;   PERIPHERAL VASCULAR CATHETERIZATION Right 10/28/2016   PTA/DEB Right SFA   WRIST SURGERY Left    cyst     Home Medications:  Prior to Admission medications   Medication Sig Start Date Ashlon Lottman Date Taking? Authorizing Provider  amLODipine (NORVASC) 5 MG tablet Take 1 tablet (5 mg total) by mouth daily. 03/07/23 06/05/23 Yes Wittenborn, Gavin Pound, NP  aspirin EC 81 MG tablet Take 81 mg by mouth daily. Swallow whole.   Yes [provider]  carvedilol (COREG) 12.5 MG tablet TAKE 1 AND 1/2 TABLETS BY MOUTH TWICE DAILY 11/14/22  Yes Gollan, Tollie Pizza, MD  cholecalciferol (VITAMIN D3) 25 MCG (1000 UNIT) tablet Take 2,000 Units by mouth 2 (two) times daily.   Yes [provider]  cloNIDine (CATAPRES) 0.1 MG tablet Take 0.1 mg by mouth 3 (three) times daily.   Yes [provider]  Evolocumab (REPATHA SURECLICK) 140 MG/ML SOAJ INJECT 140 MG UNDER THE SKIN EVERY 14 DAYS 09/27/22  Yes Gollan, Tollie Pizza, MD  ezetimibe (ZETIA) 10 MG tablet TAKE 1 TABLET(10 MG) BY MOUTH DAILY 06/20/22  Yes Gollan, Tollie Pizza, MD  insulin lispro (HUMALOG) 100  UNIT/ML KwikPen Inject into the skin.   Yes [provider]  isosorbide mononitrate (IMDUR) 30 MG 24 hr tablet Take 1 tablet (30 mg total) by mouth 2 (two) times daily. 07/18/22  Yes Antonieta Iba, MD  losartan (COZAAR) 50 MG tablet Take 50 mg by mouth daily.   Yes [provider]  nortriptyline (PAMELOR) 10 MG capsule Take 50 mg by mouth at bedtime.   Yes [provider]  oxyCODONE-acetaminophen (PERCOCET) 7.5-325 MG tablet Take 1 tablet by mouth every 4 (four) hours as needed for severe pain (pain score 7-10) or moderate pain (pain score 4-6).   Yes [provider]  pantoprazole (PROTONIX) 40 MG tablet TAKE 1 TABLET(40 MG) BY MOUTH TWICE DAILY BEFORE A MEAL 02/13/23  Yes Abernathy, Alyssa, NP  torsemide (DEMADEX) 20 MG tablet Take 1 tablet (20 mg total) by mouth 2 (two) times daily. 09/27/22  Yes Gollan, Tollie Pizza, MD  TOUJEO MAX SOLOSTAR 300 UNIT/ML Solostar Pen  60 Units daily.   Yes [provider]  calcium acetate (PHOSLO) 667 MG capsule Take 1,334 mg by mouth 3 (three) times daily. 03/19/23   [provider]  docusate calcium (SURFAK) 240 MG capsule Take 240 mg by mouth daily.    [provider]  Insulin Pen Needle (B-D ULTRAFINE III SHORT PEN) 31G X 8 MM MISC To use with pen basal and mealtime coverage insulin pens 5 times daily. DX: E11.65 04/17/20   Carlean Jews, NP  levofloxacin (LEVAQUIN) 750 MG tablet SMARTSIG:1 Tablet(s) By Mouth Patient not taking: Reported on 03/07/2023 09/18/22   [provider]  losartan (COZAAR) 100 MG tablet Take 100 mg by mouth daily. Patient not taking: Reported on 03/24/2023 10/23/22   [provider]  losartan (COZAAR) 25 MG tablet Take 25 mg by mouth every evening. Patient not taking: Reported on 03/07/2023 08/28/21   [provider]  polyethylene glycol-electrolytes (NULYTELY) 420 g solution 5pm two nights before procedure Mix and drink 8oz every 20 mins until half of  is gone rthen 5pm the night before procedure drink 8oz every 20 mins until gone 5 hours morning of procedure mix and drink 8oz every 15 mins until 1/2 of 2nd jug is gone Patient not taking: Reported on 03/07/2023 09/26/22   [name], MD  Ruxolitinib Phosphate (OPZELURA) 1.5 % CREA Apply to skin daily, legs Patient not taking: Reported on 03/24/2023 09/12/21   Deirdre Evener, MD  TRESIBA FLEXTOUCH 200 UNIT/ML FlexTouch Pen 100 Units at bedtime. Patient not taking: Reported on 03/24/2023 08/12/21   [provider]    Inpatient Medications: Scheduled Meds:  amLODipine  10 mg Oral Daily   aspirin EC  81 mg Oral Daily   Chlorhexidine Gluconate Cloth  6 each Topical Daily   ezetimibe  10 mg Oral Daily   gentamicin cream  1 Application Topical Daily   hydrALAZINE  25 mg Oral Q8H   hydrALAZINE  50 mg Oral Q8H   insulin aspart  0-5 Units Subcutaneous QHS   insulin aspart  0-6 Units Subcutaneous TID WC   insulin glargine (2 Unit Dial)  48 Units Subcutaneous Daily   isosorbide dinitrate  10 mg Oral Once   isosorbide dinitrate  20 mg Oral TID   nortriptyline  50 mg Oral QHS   torsemide  20 mg Oral BID   Continuous Infusions:  PRN Meds: acetaminophen **OR** acetaminophen, hydrALAZINE, ondansetron **OR** ondansetron (ZOFRAN) IV, senna-docusate  Allergies:    Allergies  Allergen Reactions   Iodinated Contrast Media Other (See Comments)    Kidney disease  Kidney disease  Kidney disease  Kidney disease   Other Other (See Comments)    Pain  With joint stiffness     Statins Other (See Comments)    Pain  With joint stiffness  Pain  With joint stiffness    Pregabalin Other (See Comments)    Generalized aches and pains Generalized aches and pains Generalized aches and pains Generalized aches and pains Generalized aches and pains   Sacubitril-Valsartan Other (See Comments)    Hyperkalemia  Hyperkalemia Hyperkalemia  Hyperkalemia   Atorvastatin Other (See Comments)     Muscle aches Muscle aches   Pravastatin Other (See Comments)    Muscle aches Muscle aches   Rosuvastatin Other (See Comments)    Muscle Aches Other reaction(s): JOINT PAIN Other reaction(s): JOINT PAIN Muscle Aches     Social History:   Social History   Socioeconomic History   Marital status:  Married    Spouse name: Roxanne   Number of children: Not on file   Years of education: Not on file   Highest education level: Not on file  Occupational History   Not on file  Tobacco Use   Smoking status: Former    Current packs/day: 0.25    Average packs/day: 0.3 packs/day for 39.0 years (9.8 ttl pk-yrs)    Types: Cigarettes    Passive exposure: Past   Smokeless tobacco: Never  Vaping Use   Vaping status: Never Used  Substance and Sexual Activity   Alcohol use: Not Currently    Comment: very rarely - wine   Drug use: Never   Sexual activity: Not Currently  Other Topics Concern   Not on file  Social History Narrative   Not on file   Social Determinants of Health   Financial Resource Strain: Low Risk  (09/09/2021)   Received from [facility], [facility]   Overall Financial Resource Strain (CARDIA)    Difficulty of Paying Living Expenses: Not hard at all  Food Insecurity: No Food Insecurity (03/24/2023)   Hunger Vital Sign    Worried About Running Out of Food in the Last Year: Never true    Ran Out of Food in the Last Year: Never true  Transportation Needs: No Transportation Needs (03/24/2023)   PRAPARE - Administrator, Civil Service (Medical): No    Lack of Transportation (Non-Medical): No  Physical Activity: Unknown (09/09/2021)   Received from [facility], [facility]   Exercise Vital Sign    Days of Exercise per Week: 0 days    Minutes of Exercise per Session: Not on file  Stress: No Stress Concern Present (02/23/2023)   [employer] of Occupational Health - Occupational Stress Questionnaire    Feeling of Stress : Only a little  Social  Connections: Unknown (09/17/2022)   Received from [facility], [facility]   Social Network    Social Network: Not on file  Intimate Partner Violence: Not At Risk (03/24/2023)   Humiliation, Afraid, Rape, and Kick questionnaire    Fear of Current or Ex-Partner: No    Emotionally Abused: No    Physically Abused: No    Sexually Abused: No    Family History:   Family History  Problem Relation Age of Onset   Diabetes Mother    Heart disease Father      ROS:  Please see the history of present illness. All other ROS reviewed and negative.     Physical Exam/Data:   Vitals:   03/25/23 0500 03/25/23 0822 03/25/23 1057 03/25/23 1153  BP: (!) 171/78 (!) 183/75 (!) 173/65 (!) 160/71  Pulse: 70 68  69  Resp:  16  16  Temp: 97.9 F (36.6 C)   97.9 F (36.6 C)  TempSrc: Oral     SpO2: 97% 97%  95%  Weight: 85.6 kg     Height:        Intake/Output Summary (Last 24 hours) at 03/25/2023 1301 Last data filed at 03/25/2023 1029 Gross per 24 hour  Intake 573.71 ml  Output --  Net 573.71 ml      03/25/2023    5:00 AM 03/24/2023    7:48 PM 03/24/2023    7:00 PM  Last 3 Weights  Weight (lbs) 188 lb 11.4 oz 194 lb 0.1 oz 196 lb 13.9 oz  Weight (kg) 85.6 kg 88 kg 89.3 kg     Body mass index is 24.9  kg/m.  General:  Well nourished, well developed, in no acute distress HEENT: normal Neck: no JVD Vascular: No carotid bruits; Distal pulses 2+ bilaterally Cardiac:  normal S1, S2; RRR; no murmur  Lungs:  clear to auscultation bilaterally, no wheezing, rhonchi or rales  Abd: soft, nontender, no hepatomegaly  Ext: no edema Musculoskeletal:  No deformities, BUE and BLE strength normal and equal Skin: warm and dry  Neuro:  CNs 2-12 intact, no focal abnormalities noted Psych:  Normal affect   EKG:  The EKG was personally reviewed and demonstrates: Marked sinus bradycardia with first-degree AV block, frequent PVCs, and nonspecific interventricular conduction delay. Telemetry:   Telemetry was personally reviewed and demonstrates: Normal sinus rhythm with isolated PVCs.  Relevant CV Studies: TTE (02/09/2021):  1. Left ventricular ejection fraction, by estimation, is 50 to 55%. The  left ventricle has low normal function. The left ventricle demonstrates  regional wall motion abnormalities (Septal wall hypokinesis consistent  with postoperative state.). Left  ventricular diastolic parameters are consistent with Grade II diastolic  dysfunction (pseudonormalization).   2. Right ventricular systolic function is normal. The right ventricular  size is normal. Tricuspid regurgitation signal is inadequate for assessing  PA pressure.   3. Left atrial size was mildly dilated.   Laboratory Data:  High Sensitivity Troponin:   Recent Labs  Lab 03/24/23 1138 03/24/23 1901  TROPONINIHS 33* 31*     Chemistry Recent Labs  Lab 03/24/23 1138 03/25/23 0442  NA 132* 134*  K 6.0* 4.1  CL 101 100  CO2 20* 24  GLUCOSE 153* 143*  BUN 79* 80*  CREATININE 5.84* 5.52*  CALCIUM 7.9* 8.1*  MG 2.0  --   GFRNONAA 10* 11*  ANIONGAP 11 10    Recent Labs  Lab 03/24/23 1138  PROT 6.1*  ALBUMIN 3.1*  AST 18  ALT 17  ALKPHOS 51  BILITOT 0.9   Lipids No results for input(s): "CHOL", "TRIG", "HDL", "LABVLDL", "LDLCALC", "CHOLHDL" in the last 168 hours.  Hematology Recent Labs  Lab 03/24/23 1138 03/25/23 0442  WBC 9.3 9.1  RBC 3.28* 3.63*  HGB 10.3* 11.2*  HCT 30.3* 32.7*  MCV 92.4 90.1  MCH 31.4 30.9  MCHC 34.0 34.3  RDW 14.7 14.6  PLT 237 232   Thyroid  Recent Labs  Lab 03/24/23 1138  TSH 4.006    BNPNo results for input(s): "BNP", "PROBNP" in the last 168 hours.  DDimer No results for input(s): "DDIMER" in the last 168 hours.   Radiology/Studies:  DG Chest Portable 1 View  Result Date: 03/24/2023 CLINICAL DATA:  Dizziness.  Bradycardia. EXAM: PORTABLE CHEST 1 VIEW COMPARISON:  02/09/2021. FINDINGS: Elevated right hemidiaphragm again seen. There are  probable atelectatic changes at the left lung base. Bilateral lung fields are otherwise clear. Right lateral costophrenic angle is clear. Apparent blunting of left lateral costophrenic angle, which may represent trace left pleural effusion versus superimposed lung parenchymal changes. No significant interval change. Stable cardio-mediastinal silhouette. No acute osseous abnormalities. The soft tissues are within normal limits. IMPRESSION: *No acute cardiopulmonary abnormality. Persistent blunting of left lateral costophrenic angle, which may represent trace left pleural effusion versus superimposed lung parenchymal changes. Electronically Signed   By: Jules Schick M.D.   On: 03/24/2023 14:15     Assessment and Plan:   Sinus bradycardia and frequent PVCs: I suspect that these arrhythmias were the cause of [name] [name] dizziness and generalized malaise.  Hyperkalemia certainly could have been contributing.  Bradycardia can also be seen  with medicines such as carvedilol and clonidine, though sudden onset and resolution with correction of electrolyte derangements argues against this. -Continue telemetry monitoring. -Hold clonidine and carvedilol for time being. -Obtain echocardiogram. -If LVEF is preserved and no further arrhythmia or other symptoms occur, outpatient ischemia evaluation will need to be considered.  If there is evidence of worsening LVEF and/or new wall motion abnormality, inpatient catheterization will need to be pursued.  Hypertension: Blood pressure poorly controlled. -Continue to hold carvedilol and clonidine in the setting of aforementioned bradycardia. -Continue losartan. -Increase amlodipine to 10 mg daily. -Add hydralazine 25 mg 3 times daily and transition from isosorbide mononitrate to isosorbide dinitrate 10 mg 3 times daily.  Hyperkalemia and Luvern Mcisaac-stage renal disease: I suspect Bensyn Bornemann-stage renal disease and some issue related to the patient's peritoneal dialysis or excess  dietary potassium intake contributed to his hyperkalemia. -Ongoing management per nephrology.  Coronary artery disease: No angina reported, though the patient notes that he did not have any chest pain leading up to his CABG in 2018.  Primary symptom at that time was marked fatigue, albeit different than what he experienced yesterday. -Obtain echocardiogram.  Consider outpatient stress test versus inpatient catheterization based on results of echo and symptoms over the next day. -Continue secondary prevention with aspirin and ezetimibe as well as outpatient Repatha. -Transition from isosorbide mononitrate to isosorbide dinitrate for blood pressure control and antianginal therapy in the setting of HFrEF with recovered ejection fraction and uncontrolled hypertension.  Heart failure with recovered ejection fraction: Patient appears euvolemic on exam today. -Ongoing volume management per nephrology.  Okay to continue home dose of torsemide 20 mg twice daily. -Continue losartan; hold carvedilol with presenting bradycardia. -Transition from isosorbide mononitrate alone to isosorbide dinitrate and hydralazine 3 times daily.  Type 2 diabetes mellitus: -Per internal medicine.  For questions or updates, please contact [facility] Please consult [email or web address] for contact info under [facility] Cardiology.  Signed, Yvonne Kendall, MD  03/25/2023 1:01 PM

## 2023-03-25 NOTE — Care Management CC44 (Signed)
Condition Code 44 Documentation Completed  Patient Details  Name: Lehi Corns MRN: [id number] Date of Birth: Dec 08, 1960   Condition Code 44 given:  Yes Patient signature on Condition Code 44 notice:  Yes Documentation of 2 MD's agreement:  Yes Code 44 added to claim:  Yes    Darolyn Rua, LCSW 03/25/2023, 11:46 AM

## 2023-03-25 NOTE — Discharge Summary (Signed)
Physician Discharge Summary   Patient: [name] [name] MRN: [id number] DOB: 16-Jan-1961  Admit date:     03/24/2023  Discharge date: 03/25/23  Discharge Physician: Kameria Canizares   PCP: Sallyanne Kuster, NP   Recommendations at discharge:   Follow-up with nephrology as an outpatient  Discharge Diagnoses: Principal Problem:   Hyperkalemia Active Problems:   Symptomatic bradycardia   Diabetes mellitus with stage 4 chronic kidney disease GFR 15-29 (HCC)   Essential hypertension   Sleep apnea   PAD (peripheral artery disease) (HCC)   Gastroesophageal reflux disease with esophagitis without hemorrhage   Peritoneal dialysis catheter in place [facility])   CAD (coronary artery disease)   Hx of CABG   Anemia  Resolved Problems:   * No resolved hospital problems. *  Hospital Course: Chief Concern: Dizziness   HPI: [name] is a 62 year old male with history of CAD status post quadruple bypass in 2018, hypertension, hyperlipidemia, GERD, anemia, end-stage renal disease on peritoneal dialysis, who presents emergency department for chief concerns of dizziness.   Vitals in the ED showed temperature of 97.6, respiration rate 18, heart rate of 44, initial blood pressure recorded at 89/60 improved to 149/75, SpO2 of 96% on room air.   Serum sodium is 132, potassium three 6.0, chloride 101, bicarb 20, BUN of 79, serum creatinine of 5.84, EGFR of 10, nonfasting blood glucose 153, WBC 9.3, hemoglobin 10.3, platelets of 237.   High sensitive troponin was 33.   ED treatment: Calcium gluconate 1 g IV one-time dose, sodium bicarbonate 150 mill equivalent infusion.  EDP consulted with [facility] cardiology, Dr. Kirke Corin and nephrology, Dr. Wynelle Link. ------------------------------------- At bedside, patient is able to tell me his  name, age, current year.    He reports that he developed dizziness this morning after washing is face and brushing his teeth. He had not had breakfast yet.    He denies  recent trauma to his person. He reports this feels different when he had open heart surgery in 2018. In 2018, he felt extreme exhaustion at the time.    He has not felt this way. He denies fever, cough, vomiting, shortness of breath, chest pain. He endorses nausea this AM. He denies changes to urination, dysuria, hematuria, diarrhea. He endorses constipation, last BM was last evening, dark brown, which is normal.    Social history: He lives at home with his wife. He is a tobacco user, smoking 1 cigarettes per day. Prior to 2018, he smoked 2 ppd. He infrequently consumes etoh. He denies recreational drug use. 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      Assessment and Plan:  * Hyperkalemia Resolved Probably medication induced as patient has been on losartan Patient received calcium gluconate and sodium bicarb Will discontinue losartan   Symptomatic bradycardia Patient presented to the ER for evaluation of dizziness and was found to be bradycardic with heart rates in the 40s Thought to be medication induced in the setting of hyperkalemia Discontinue carvedilol and clonidine Appreciate cardiology input Patient to follow-up with cardiology and nephrology as an outpatient      Diabetes mellitus with end-stage renal disease on peritoneal dialysis Blood sugars are stable Continue long-acting insulin as well as Premeal insulin      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 H&H is stable    Hx of CABG Status post quadruple therapy in 2018 Continue aspirin and nitrates      Sleep apnea Continue CPAP    Essential hypertension Improved blood  pressure control Continue amlodipine 10 mg daily, hydralazine 50 mg 3 times daily and Isordil 20 mg 3 times daily         Consultants: Nephrology, cardiology Procedures performed: Peritoneal dialysis Disposition: Home Diet recommendation:  Discharge Diet Orders (From admission, onward)     Start      Ordered   03/25/23 0000  Diet - low sodium heart healthy        03/25/23 1341           Renal diet DISCHARGE MEDICATION: Allergies as of 03/25/2023       Reactions   Iodinated Contrast Media Other (See Comments)   Kidney disease  Kidney disease  Kidney disease  Kidney disease   Other Other (See Comments)   Pain  With joint stiffness    Statins Other (See Comments)   Pain  With joint stiffness  Pain  With joint stiffness    Pregabalin Other (See Comments)   Generalized aches and pains Generalized aches and pains Generalized aches and pains Generalized aches and pains Generalized aches and pains   Sacubitril-valsartan Other (See Comments)   Hyperkalemia Hyperkalemia Hyperkalemia  Hyperkalemia   Atorvastatin Other (See Comments)   Muscle aches Muscle aches   Pravastatin Other (See Comments)   Muscle aches Muscle aches   Rosuvastatin Other (See Comments)   Muscle Aches Other reaction(s): JOINT PAIN Other reaction(s): JOINT PAIN Muscle Aches        Medication List     STOP taking these medications    carvedilol 12.5 MG tablet Commonly known as: COREG   cloNIDine 0.1 MG tablet Commonly known as: CATAPRES   isosorbide mononitrate 30 MG 24 hr tablet Commonly known as: IMDUR   levofloxacin 750 MG tablet Commonly known as: LEVAQUIN   losartan 100 MG tablet Commonly known as: COZAAR   losartan 25 MG tablet Commonly known as: COZAAR   losartan 50 MG tablet Commonly known as: COZAAR   Opzelura 1.5 % Crea Generic drug: Ruxolitinib Phosphate   polyethylene glycol-electrolytes 420 g solution Commonly known as: NuLYTELY   Tresiba FlexTouch 200 UNIT/ML FlexTouch Pen Generic drug: insulin degludec       TAKE these medications    amLODipine 10 MG tablet Commonly known as: NORVASC Take 1 tablet (10 mg total) by mouth daily. Start taking on: March 26, 2023 What changed:  medication strength how much to take   aspirin EC 81 MG tablet Take 81  mg by mouth daily. Swallow whole.   B-D ULTRAFINE III SHORT PEN 31G X 8 MM Misc Generic drug: Insulin Pen Needle To use with pen basal and mealtime coverage insulin pens 5 times daily. DX: E11.65   calcium acetate 667 MG capsule Commonly known as: PHOSLO Take 1,334 mg by mouth 3 (three) times daily.   cholecalciferol 25 MCG (1000 UNIT) tablet Commonly known as: VITAMIN D3 Take 2,000 Units by mouth 2 (two) times daily.   docusate calcium 240 MG capsule Commonly known as: SURFAK Take 240 mg by mouth daily.   ezetimibe 10 MG tablet Commonly known as: ZETIA TAKE 1 TABLET(10 MG) BY MOUTH DAILY   hydrALAZINE 50 MG tablet Commonly known as: APRESOLINE Take 1 tablet (50 mg total) by mouth 3 (three) times daily.   insulin lispro 100 UNIT/ML KwikPen Commonly known as: HUMALOG Inject into the skin.   isosorbide dinitrate 20 MG tablet Commonly known as: ISORDIL Take 1 tablet (20 mg total) by mouth 3 (three) times daily.   nortriptyline 10 MG capsule  Commonly known as: PAMELOR Take 50 mg by mouth at bedtime.   oxyCODONE-acetaminophen 7.5-325 MG tablet Commonly known as: PERCOCET Take 1 tablet by mouth every 4 (four) hours as needed for severe pain (pain score 7-10) or moderate pain (pain score 4-6).   pantoprazole 40 MG tablet Commonly known as: PROTONIX TAKE 1 TABLET(40 MG) BY MOUTH TWICE DAILY BEFORE A MEAL   Repatha SureClick 140 MG/ML Soaj Generic drug: Evolocumab INJECT 140 MG UNDER THE SKIN EVERY 14 DAYS   torsemide 20 MG tablet Commonly known as: DEMADEX Take 1 tablet (20 mg total) by mouth 2 (two) times daily.   Toujeo Max SoloStar 300 UNIT/ML Solostar Pen Generic drug: insulin glargine (2 Unit Dial) 60 Units daily.        Follow-up Information     Kolluru, Sarath, MD Follow up in 8 day(s).   Specialty: Nephrology Why: Kittie Plater information: [address] [place] [address] [phone]                Discharge  Exam: Ceasar Mons Weights   03/24/23 1900 03/24/23 1948 03/25/23 0500  Weight: 89.3 kg 88 kg 85.6 kg   Constitutional: appears frail, older than chronological age, NAD, calm Eyes: PERRL, lids and conjunctivae normal ENMT: Mucous membranes are moist. Posterior pharynx clear of any exudate or lesions. Age-appropriate dentition. Hearing appropriate Neck: normal, supple, no masses, no thyromegaly Respiratory: clear to auscultation bilaterally, no wheezing, no crackles. Normal respiratory effort. No accessory muscle use.  Cardiovascular: Regular rate and rhythm, no murmurs / rubs / gallops. Trace bilateral lower extremity swelling. 2+ pedal pulses. No carotid bruits.  Abdomen: no tenderness, no masses palpated, no hepatosplenomegaly. Bowel sounds positive.  PD catheter in place Musculoskeletal: no clubbing / cyanosis. No joint deformity upper and lower extremities. Good ROM, no contractures, no atrophy. Normal muscle tone.  Skin: no rashes, lesions, ulcers. No induration.  Chronic skin discoloration of bilateral lower extremity, per patient this is baseline.   Neurologic: Sensation intact. Strength 5/5 in all 4.  Psychiatric: Normal judgment and insight. Alert and oriented x 3. Normal mood.     Condition at discharge: stable  The results of significant diagnostics from this hospitalization (including imaging, microbiology, ancillary and laboratory) are listed below for reference.   Imaging Studies: DG Chest Portable 1 View  Result Date: 03/24/2023 CLINICAL DATA:  Dizziness.  Bradycardia. EXAM: PORTABLE CHEST 1 VIEW COMPARISON:  02/09/2021. FINDINGS: Elevated right hemidiaphragm again seen. There are probable atelectatic changes at the left lung base. Bilateral lung fields are otherwise clear. Right lateral costophrenic angle is clear. Apparent blunting of left lateral costophrenic angle, which may represent trace left pleural effusion versus superimposed lung parenchymal changes. No significant  interval change. Stable cardio-mediastinal silhouette. No acute osseous abnormalities. The soft tissues are within normal limits. IMPRESSION: *No acute cardiopulmonary abnormality. Persistent blunting of left lateral costophrenic angle, which may represent trace left pleural effusion versus superimposed lung parenchymal changes. Electronically Signed   By: Jules Schick M.D.   On: 03/24/2023 14:15    Microbiology: Results for orders placed or performed during the hospital encounter of 02/08/21  Resp Panel by RT-PCR (Flu A&B, Covid) Nasopharyngeal Swab     Status: Abnormal   Collection Time: 02/08/21  4:34 PM   Specimen: Nasopharyngeal Swab; Nasopharyngeal(NP) swabs in vial transport medium  Result Value Ref Range Status   SARS Coronavirus 2 by RT PCR POSITIVE (A) NEGATIVE Final    Comment: RESULT CALLED TO, READ BACK BY  AND VERIFIED WITH: JESS ANDERSON @1750  02/08/21 MJU (NOTE) SARS-CoV-2 target nucleic acids are DETECTED.  The SARS-CoV-2 RNA is generally detectable in upper respiratory specimens during the acute phase of infection. Positive results are indicative of the presence of the identified virus, but do not rule out bacterial infection or co-infection with other pathogens not detected by the test. Clinical correlation with patient history and other diagnostic information is necessary to determine patient infection status. The expected result is Negative.  Fact Sheet for Patients: BloggerCourse.com  Fact Sheet for Healthcare Providers: SeriousBroker.it  This test is not yet approved or cleared by the [place] FDA and  has been authorized for detection and/or diagnosis of SARS-CoV-2 by FDA under an Emergency Use Authorization (EUA).  This EUA will remain in effect (meaning this test can be u sed) for the duration of  the COVID-19 declaration under Section 564(b)(1) of the Act, 21 U.S.C. section 360bbb-3(b)(1), unless the  authorization is terminated or revoked sooner.     Influenza A by PCR NEGATIVE NEGATIVE Final   Influenza B by PCR NEGATIVE NEGATIVE Final    Comment: (NOTE) The Xpert Xpress SARS-CoV-2/FLU/RSV plus assay is intended as an aid in the diagnosis of influenza from Nasopharyngeal swab specimens and should not be used as a sole basis for treatment. Nasal washings and aspirates are unacceptable for Xpert Xpress SARS-CoV-2/FLU/RSV testing.  Fact Sheet for Patients: BloggerCourse.com  Fact Sheet for Healthcare Providers: SeriousBroker.it  This test is not yet approved or cleared by the [place] FDA and has been authorized for detection and/or diagnosis of SARS-CoV-2 by FDA under an Emergency Use Authorization (EUA). This EUA will remain in effect (meaning this test can be used) for the duration of the COVID-19 declaration under Section 564(b)(1) of the Act, 21 U.S.C. section 360bbb-3(b)(1), unless the authorization is terminated or revoked.  Performed at [facility], [address] [place], [place] [address]     Labs: CBC: Recent Labs  Lab 03/24/23 1138 03/25/23 0442  WBC 9.3 9.1  NEUTROABS 6.2  --   HGB 10.3* 11.2*  HCT 30.3* 32.7*  MCV 92.4 90.1  PLT 237 232   Basic Metabolic Panel: Recent Labs  Lab 03/24/23 1138 03/25/23 0442  NA 132* 134*  K 6.0* 4.1  CL 101 100  CO2 20* 24  GLUCOSE 153* 143*  BUN 79* 80*  CREATININE 5.84* 5.52*  CALCIUM 7.9* 8.1*  MG 2.0  --   PHOS 6.9*  --    Liver Function Tests: Recent Labs  Lab 03/24/23 1138  AST 18  ALT 17  ALKPHOS 51  BILITOT 0.9  PROT 6.1*  ALBUMIN 3.1*   CBG: Recent Labs  Lab 03/24/23 1712 03/24/23 2216 03/25/23 0821 03/25/23 1154  GLUCAP 190* 187* 91 164*    Discharge time spent: greater than 30 minutes.  Signed: Lucile Shutters, MD [facility] 03/25/2023

## 2023-03-25 NOTE — Progress Notes (Signed)
[facility]  ROUNDING NOTE   Subjective:   [name] was admitted to [facility] on 03/24/2023 for Hyperkalemia [E87.5] Symptomatic bradycardia [R00.1] ESRD on peritoneal dialysis (HCC) [N18.6, Z99.2] Type 2 diabetes mellitus without complication, with long-term current use of insulin (HCC) [E11.9, Z79.4]  Patient seen sitting up in bed Breakfast completed States he feels much better than yesterday Denies dizziness  States he's ready for discharge.   Objective:  Vital signs in last 24 hours:  Temp:  [97.4 F (36.3 C)-98.5 F (36.9 C)] 97.9 F (36.6 C) (10/29 0500) Pulse Rate:  [31-79] 68 (10/29 0822) Resp:  [13-20] 16 (10/29 0822) BP: (89-195)/(60-89) 183/75 (10/29 0822) SpO2:  [78 %-100 %] 97 % (10/29 0822) Weight:  [85.6 kg-89.3 kg] 85.6 kg (10/29 0500)  Weight change:  Filed Weights   03/24/23 1900 03/24/23 1948 03/25/23 0500  Weight: 89.3 kg 88 kg 85.6 kg    Intake/Output: I/O last 3 completed shifts: In: 333.7 [I.V.:333.7] Out: -    Intake/Output this shift:  Total I/O In: 240 [P.O.:240] Out: -   Physical Exam: General: NAD,   Head: Normocephalic, atraumatic. Moist oral mucosal membranes  Eyes: Anicteric  Lungs:  Clear to auscultation  Heart: Regular rate and rhythm   Abdomen:  Soft, nontender  Extremities:  no peripheral edema.  Neurologic: Alert and oriented, moving all four extremities  Skin: No lesions  Access: PD catheter    Basic Metabolic Panel: Recent Labs  Lab 03/24/23 1138 03/25/23 0442  NA 132* 134*  K 6.0* 4.1  CL 101 100  CO2 20* 24  GLUCOSE 153* 143*  BUN 79* 80*  CREATININE 5.84* 5.52*  CALCIUM 7.9* 8.1*  MG 2.0  --   PHOS 6.9*  --     Liver Function Tests: Recent Labs  Lab 03/24/23 1138  AST 18  ALT 17  ALKPHOS 51  BILITOT 0.9  PROT 6.1*  ALBUMIN 3.1*   No results for input(s): "LIPASE", "AMYLASE" in the last 168 hours. No results for input(s): "AMMONIA" in the last 168 hours.  CBC: Recent  Labs  Lab 03/24/23 1138 03/25/23 0442  WBC 9.3 9.1  NEUTROABS 6.2  --   HGB 10.3* 11.2*  HCT 30.3* 32.7*  MCV 92.4 90.1  PLT 237 232    Cardiac Enzymes: No results for input(s): "CKTOTAL", "CKMB", "CKMBINDEX", "TROPONINI" in the last 168 hours.  BNP: Invalid input(s): "POCBNP"  CBG: Recent Labs  Lab 03/24/23 1712 03/24/23 2216 03/25/23 0821  GLUCAP 190* 187* 91    Microbiology: Results for orders placed or performed during the hospital encounter of 02/08/21  Resp Panel by RT-PCR (Flu A&B, Covid) Nasopharyngeal Swab     Status: Abnormal   Collection Time: 02/08/21  4:34 PM   Specimen: Nasopharyngeal Swab; Nasopharyngeal(NP) swabs in vial transport medium  Result Value Ref Range Status   SARS Coronavirus 2 by RT PCR POSITIVE (A) NEGATIVE Final    Comment: RESULT CALLED TO, READ BACK BY AND VERIFIED WITH: JESS ANDERSON @1750  02/08/21 MJU (NOTE) SARS-CoV-2 target nucleic acids are DETECTED.  The SARS-CoV-2 RNA is generally detectable in upper respiratory specimens during the acute phase of infection. Positive results are indicative of the presence of the identified virus, but do not rule out bacterial infection or co-infection with other pathogens not detected by the test. Clinical correlation with patient history and other diagnostic information is necessary to determine patient infection status. The expected result is Negative.  Fact Sheet for Patients: BloggerCourse.com  Fact Sheet  for Healthcare Providers: SeriousBroker.it  This test is not yet approved or cleared by the [place] and  has been authorized for detection and/or diagnosis of SARS-CoV-2 by FDA under an Emergency Use Authorization (EUA).  This EUA will remain in effect (meaning this test can be u sed) for the duration of  the COVID-19 declaration under Section 564(b)(1) of the Act, 21 U.S.C. section 360bbb-3(b)(1), unless the authorization  is terminated or revoked sooner.     Influenza A by PCR NEGATIVE NEGATIVE Final   Influenza B by PCR NEGATIVE NEGATIVE Final    Comment: (NOTE) The Xpert Xpress SARS-CoV-2/FLU/RSV plus assay is intended as an aid in the diagnosis of influenza from Nasopharyngeal swab specimens and should not be used as a sole basis for treatment. Nasal washings and aspirates are unacceptable for Xpert Xpress SARS-CoV-2/FLU/RSV testing.  Fact Sheet for Patients: BloggerCourse.com  Fact Sheet for Healthcare Providers: SeriousBroker.it  This test is not yet approved or cleared by the [place] FDA and has been authorized for detection and/or diagnosis of SARS-CoV-2 by FDA under an Emergency Use Authorization (EUA). This EUA will remain in effect (meaning this test can be used) for the duration of the COVID-19 declaration under Section 564(b)(1) of the Act, 21 U.S.C. section 360bbb-3(b)(1), unless the authorization is terminated or revoked.  Performed at [facility], [address] [place], [place] [address]     Coagulation Studies: No results for input(s): "LABPROT", "INR" in the last 72 hours.  Urinalysis: No results for input(s): "COLORURINE", "LABSPEC", "PHURINE", "GLUCOSEU", "HGBUR", "BILIRUBINUR", "KETONESUR", "PROTEINUR", "UROBILINOGEN", "NITRITE", "LEUKOCYTESUR" in the last 72 hours.  Invalid input(s): "APPERANCEUR"    Imaging: DG Chest Portable 1 View  Result Date: 03/24/2023 CLINICAL DATA:  Dizziness.  Bradycardia. EXAM: PORTABLE CHEST 1 VIEW COMPARISON:  02/09/2021. FINDINGS: Elevated right hemidiaphragm again seen. There are probable atelectatic changes at the left lung base. Bilateral lung fields are otherwise clear. Right lateral costophrenic angle is clear. Apparent blunting of left lateral costophrenic angle, which may represent trace left pleural effusion versus superimposed lung parenchymal changes. No  significant interval change. Stable cardio-mediastinal silhouette. No acute osseous abnormalities. The soft tissues are within normal limits. IMPRESSION: *No acute cardiopulmonary abnormality. Persistent blunting of left lateral costophrenic angle, which may represent trace left pleural effusion versus superimposed lung parenchymal changes. Electronically Signed   By: Jules Schick M.D.   On: 03/24/2023 14:15     Medications:      amLODipine  10 mg Oral Daily   aspirin EC  81 mg Oral Daily   Chlorhexidine Gluconate Cloth  6 each Topical Daily   ezetimibe  10 mg Oral Daily   gentamicin cream  1 Application Topical Daily   hydrALAZINE  25 mg Oral Q8H   hydrALAZINE  25 mg Oral Once   hydrALAZINE  50 mg Oral Q8H   insulin aspart  0-5 Units Subcutaneous QHS   insulin aspart  0-6 Units Subcutaneous TID WC   insulin glargine (2 Unit Dial)  48 Units Subcutaneous Daily   isosorbide dinitrate  10 mg Oral TID   nortriptyline  50 mg Oral QHS   torsemide  20 mg Oral BID   acetaminophen **OR** acetaminophen, hydrALAZINE, ondansetron **OR** ondansetron (ZOFRAN) IV, senna-docusate  Assessment/ Plan:  [name] is a 62 y.o.  male with end stage renal disease on peritoneal dialysis, hypertension, diabetes mellitus type II, diabetic retinopathy, COPD, coronary artery disease status post CABG, GERD/peptic ulcer disease, congestive heart failure, hyperlipidemia, peripheral  arterial disease, who is admitted to [facility] on 03/24/2023 for Hyperkalemia [E87.5] Symptomatic bradycardia [R00.1] ESRD on peritoneal dialysis (HCC) [N18.6, Z99.2] Type 2 diabetes mellitus without complication, with long-term current use of insulin (HCC) [E11.9, Z79.4]  CAPD 2 exchanges 2.5 liter fills 91.5kg  End Stage Renal Disease with hyperkalemia on peritoneal dialysis. Not in volume overload.  - Tolerated treatment well overnight with 1.5%  Bradycardia and Hypotension: home regimen of amlodipine, clonidine, carvedilol,  isosorbide mononitrate, torsemide and losartan. - hold clonidine and carvedilol - appreciate cardiology input    Anemia with chronic kidney disease: hemoglobin normocytic 11.2. Mircera as outpatient.   Secondary Hyperparathyroidism: with hyperphosphatemia.  - TUMS with meals - Calcium stable and phosphorus slightly elevated.   Diabetes mellitus type II with chronic kidney disease: insulin dependent.  - Continue glucose control.  - Primary team to manage SSI    LOS: 1 [name] [name] 10/29/202410:42 AM

## 2023-03-25 NOTE — Progress Notes (Signed)
*  PRELIMINARY RESULTS* Echocardiogram 2D Echocardiogram has been performed.  [name] [name] 03/25/2023, 10:40 AM

## 2023-03-25 NOTE — Progress Notes (Signed)
Patient refused Glargine. Patient states he only takes Toujeo. States if you dont have this he will call his wife to bring it in. Sent a message to pharmacy as well as Dr. Para March. Patient monitors his own blood sugar with   his phone. Denies pain or discomfort

## 2023-03-25 NOTE — Care Management Obs Status (Cosign Needed)
MEDICARE OBSERVATION STATUS NOTIFICATION   Patient Details  Name: [name] [name] MRN: [id number] Date of Birth: [birth year]   Medicare Observation Status Notification Given:  Yes    Darolyn Rua, LCSW 03/25/2023, 11:46 AM

## 2023-03-26 ENCOUNTER — Telehealth: Payer: Self-pay | Admitting: Nurse Practitioner

## 2023-03-26 ENCOUNTER — Encounter: Payer: Self-pay | Admitting: Nurse Practitioner

## 2023-03-26 ENCOUNTER — Ambulatory Visit (INDEPENDENT_AMBULATORY_CARE_PROVIDER_SITE_OTHER): Payer: Medicare Other | Admitting: Nurse Practitioner

## 2023-03-26 VITALS — BP 161/73 | HR 73 | Temp 98.3°F | Resp 16 | Ht 73.0 in | Wt 189.4 lb

## 2023-03-26 DIAGNOSIS — Z992 Dependence on renal dialysis: Secondary | ICD-10-CM

## 2023-03-26 DIAGNOSIS — N186 End stage renal disease: Secondary | ICD-10-CM

## 2023-03-26 DIAGNOSIS — Z09 Encounter for follow-up examination after completed treatment for conditions other than malignant neoplasm: Secondary | ICD-10-CM

## 2023-03-26 DIAGNOSIS — I120 Hypertensive chronic kidney disease with stage 5 chronic kidney disease or end stage renal disease: Secondary | ICD-10-CM

## 2023-03-26 DIAGNOSIS — E1122 Type 2 diabetes mellitus with diabetic chronic kidney disease: Secondary | ICD-10-CM | POA: Diagnosis not present

## 2023-03-26 DIAGNOSIS — N185 Chronic kidney disease, stage 5: Secondary | ICD-10-CM

## 2023-03-26 DIAGNOSIS — I5022 Chronic systolic (congestive) heart failure: Secondary | ICD-10-CM

## 2023-03-26 DIAGNOSIS — E875 Hyperkalemia: Secondary | ICD-10-CM

## 2023-03-26 DIAGNOSIS — Z794 Long term (current) use of insulin: Secondary | ICD-10-CM

## 2023-03-26 NOTE — Progress Notes (Cosign Needed)
[facility] [place], [place] [address] [place] [address] [phone]                                   Transitional Care Clinic   [facility] Discharge Acute Issues Care Follow Up                                                                        Patient Demographics  [name] [name], is a 62 y.o. male  DOB [birth year]  MRN [id number].  Primary MD  Sallyanne Kuster, NP  Admit date:     03/24/2023  Discharge date: 03/25/23    Reason for TCC follow Up - hypokalemia    Past Medical History:  Diagnosis Date   Anemia    Arthritis    CAD (coronary artery disease)    a. 12/2016 s/p CABG x 4 (LIMA->LAD, VG->RCA, VG->OM1, VG->D1); b. 02/2018 MV: EF 35%, small-med inferolaterlal infarct. No ischemia.   CKD (chronic kidney disease), stage IV [facility])    Peritoneal Dialysis   Colon polyps    COPD (chronic obstructive pulmonary disease) (HCC)    Dupre's syndrome    GERD (gastroesophageal reflux disease)    HFrEF (heart failure reduced ejection fraction) (HCC)    a.) 2018 EF 35%; b.) 02/2018 EF 50%; c). 02/2019 EF 40-45%; d.) 08/2019 Echo: EF 50-55%, no rwma, GrII DD; e.) 04/2020 Echo: EF 30-35%, mod-sev glob HK. Mild LVH. G2DD. Low-nl RV fxn. Mildly dil LA. Mild MR. Mild-mod Ao sclerosis w/o stenosis; f.) TTE 02/09/2021: EF 55%; septal HK, LAE; G2DD.   History of 2019 novel coronavirus disease (COVID-19) 02/08/2021   Hyperlipidemia    Hypertension    Ischemic cardiomyopathy    a.) 02/2018 MV: EF 35%; b.) 08/2019 Echo: EF 50-55%; c.) 04/2020 Echo: EF 30-35%; d.) TTE 02/09/2021: EF 50-55%.   Lymphedema    Migraine    Myocardial infarction [facility])    Neuropathy    PAD (peripheral artery disease) (HCC)    a. 04/2017 Aortoiliac duplex: R iliac dzs; b. 03/2020 LE Duplex: mild bilat atherosclerosis throughout. Patent vessels.   Pneumonia    Retinopathy    S/P CABG x 4    a.)  4v CABG: LIMA->LAD, SVG->RCA, SVG->OM1, SVG->D1   Sleep apnea    a.) does not  require nocturnal PAP therapy   Statin intolerance    Stomach ulcer    T2DM (type 2 diabetes mellitus) (HCC)     Past Surgical History:  Procedure Laterality Date   bone graft surgery Bilateral    on both feet x 2   CAPD INSERTION N/A 11/13/2021   Procedure: LAPAROSCOPIC INSERTION CONTINUOUS AMBULATORY PERITONEAL DIALYSIS  (CAPD) CATHETER;  Surgeon: Leafy Ro, MD;  Location: [facility] ORS;  Service: General;  Laterality: N/A;   CAPD REVISION N/A 01/10/2022   Procedure: LAPAROSCOPIC REVISION CONTINUOUS AMBULATORY PERITONEAL DIALYSIS  (CAPD) CATHETER;  Surgeon: Leafy Ro, MD;  Location: [facility] ORS;  Service: General;  Laterality: N/A;   CARDIAC CATHETERIZATION  2013   S/p PCI    CARDIAC CATHETERIZATION  2018   S/p CABG   CATARACT EXTRACTION Bilateral    COLONOSCOPY  COLONOSCOPY WITH PROPOFOL N/A 10/17/2022   Procedure: COLONOSCOPY WITH PROPOFOL;  Surgeon: [name], MD;  Location: [facility] ENDOSCOPY;  Service: Endoscopy;  Laterality: N/A;   CORONARY ARTERY BYPASS GRAFT  2018   (LIMA-LAD,VG-RCA,VG-OM1,VG-D1)   ESOPHAGOGASTRODUODENOSCOPY (EGD) WITH PROPOFOL N/A 04/04/2020   Procedure: ESOPHAGOGASTRODUODENOSCOPY (EGD) WITH PROPOFOL;  Surgeon: [name], MD;  Location: [facility] ENDOSCOPY;  Service: Endoscopy;  Laterality: N/A;   ESOPHAGOGASTRODUODENOSCOPY (EGD) WITH PROPOFOL N/A 06/06/2020   Procedure: ESOPHAGOGASTRODUODENOSCOPY (EGD) WITH PROPOFOL;  Surgeon: [name], MD;  Location: [facility] ENDOSCOPY;  Service: Endoscopy;  Laterality: N/A;   ESOPHAGOGASTRODUODENOSCOPY (EGD) WITH PROPOFOL N/A 09/24/2022   Procedure: ESOPHAGOGASTRODUODENOSCOPY (EGD) WITH PROPOFOL;  Surgeon: [name], MD;  Location: [facility] ENDOSCOPY;  Service: Endoscopy;  Laterality: N/A;   PERIPHERAL VASCULAR CATHETERIZATION Right 10/28/2016   PTA/DEB Right SFA   WRIST SURGERY Left    cyst       Recent HPI and Hospital Course  Hospital Course: Chief Concern: Dizziness   HPI: [name] is a  62 year old male with history of CAD status post quadruple bypass in 2018, hypertension, hyperlipidemia, GERD, anemia, end-stage renal disease on peritoneal dialysis, who presents emergency department for chief concerns of dizziness.   Vitals in the ED showed temperature of 97.6, respiration rate 18, heart rate of 44, initial blood pressure recorded at 89/60 improved to 149/75, SpO2 of 96% on room air.   Serum sodium is 132, potassium three 6.0, chloride 101, bicarb 20, BUN of 79, serum creatinine of 5.84, EGFR of 10, nonfasting blood glucose 153, WBC 9.3, hemoglobin 10.3, platelets of 237.   High sensitive troponin was 33.   ED treatment: Calcium gluconate 1 g IV one-time dose, sodium bicarbonate 150 mill equivalent infusion.  EDP consulted with [facility] cardiology, Dr. Kirke Corin and nephrology, Dr. Wynelle Link. ------------------------------------- At bedside, patient is able to tell me his  name, age, current year.    He reports that he developed dizziness this morning after washing is face and brushing his teeth. He had not had breakfast yet.    He denies recent trauma to his person. He reports this feels different when he had open heart surgery in 2018. In 2018, he felt extreme exhaustion at the time.    He has not felt this way. He denies fever, cough, vomiting, shortness of breath, chest pain. He endorses nausea this AM. He denies changes to urination, dysuria, hematuria, diarrhea. He endorses constipation, last BM was last evening, dark brown, which is normal.    Social history: He lives at home with his wife. He is a tobacco user, smoking 1 cigarettes per day. Prior to 2018, he smoked 2 ppd. He infrequently consumes etoh. He denies recreational drug use. 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         Assessment and Plan:   * Hyperkalemia Resolved Probably medication induced as patient has been on losartan Patient received calcium gluconate and sodium bicarb Will  discontinue losartan     Symptomatic bradycardia Patient presented to the ER for evaluation of dizziness and was found to be bradycardic with heart rates in the 40s Thought to be medication induced in the setting of hyperkalemia Discontinue carvedilol and clonidine Appreciate cardiology input Patient to follow-up with cardiology and nephrology as an outpatient         Diabetes mellitus with end-stage renal disease on peritoneal dialysis Blood sugars are stable Continue long-acting insulin as well as Premeal insulin       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 H&H is stable     Hx of CABG Status post quadruple therapy in 2018 Continue aspirin and nitrates       Sleep apnea Continue CPAP     Essential hypertension Improved blood pressure control Continue amlodipine 10 mg daily, hydralazine 50 mg 3 times daily and Isordil 20 mg 3 times daily        Post Hospital Acute Care Issue to be followed in the Clinic   Principal Problem:   Hyperkalemia Active Problems:   Symptomatic bradycardia   Diabetes mellitus with stage 4 chronic kidney disease GFR 15-29 (HCC)   Essential hypertension   Sleep apnea   PAD (peripheral artery disease) (HCC)   Gastroesophageal reflux disease with esophagitis without hemorrhage   Peritoneal dialysis catheter in place [facility])   CAD (coronary artery disease)   Hx of CABG   Anemia   Subjective:   [name] [name] today has, No headache, No chest pain, No abdominal pain - No Nausea, No new weakness tingling or numbness, No Cough - SOB.   Assessment & Plan    1. Hospital discharge follow-up (Primary) Treated in hospital for high potassium level.   2. Hyperkalemia Corrected during hospital stay   3. Type 2 diabetes mellitus with chronic kidney disease on chronic dialysis, with long-term current use of insulin (HCC) Managed by endocrinology  4. Stage 5 chronic kidney disease on chronic dialysis [facility]) Managed by nephrology, on  CAPD  5. Chronic systolic CHF (congestive heart failure) (HCC) Managed by cardiology  6. Hypertension associated with stage 5 chronic kidney disease due to type 2 diabetes mellitus (HCC) Not controlled, actively being managed and medication adjusted by cardiology and nephrology.    Reason for frequent admissions/ER visits    CKD, dialysis Hyperkalemia CHF Hypertension diabetes   Objective:   Vitals:   03/26/23 1310  BP: (!) 161/73  Pulse: 73  Resp: 16  Temp: 98.3 F (36.8 C)  SpO2: 98%  Weight: 189 lb 6 oz (85.9 kg)  Height: 6\' 1"  (1.854 m)    Wt Readings from Last 3 Encounters:  05/06/23 193 lb 6 oz (87.7 kg)  04/29/23 196 lb (88.9 kg)  04/10/23 196 lb 12.8 oz (89.3 kg)    Allergies as of 03/26/2023       Reactions   Iodinated Contrast Media Other (See Comments)   Kidney disease  Kidney disease  Kidney disease  Kidney disease   Other Other (See Comments)   Pain  With joint stiffness    Statins Other (See Comments)   Pain  With joint stiffness  Pain  With joint stiffness    Pregabalin Other (See Comments)   Generalized aches and pains Generalized aches and pains Generalized aches and pains Generalized aches and pains Generalized aches and pains   Sacubitril-valsartan Other (See Comments)   Hyperkalemia Hyperkalemia Hyperkalemia  Hyperkalemia   Atorvastatin Other (See Comments)   Muscle aches Muscle aches   Pravastatin Other (See Comments)   Muscle aches Muscle aches   Rosuvastatin Other (See Comments)   Muscle Aches Other reaction(s): JOINT PAIN Other reaction(s): JOINT PAIN Muscle Aches        Medication List        Accurate as of March 26, 2023 11:59 PM. If you have any questions, ask your nurse or doctor.          amLODipine 10 MG tablet Commonly known as: NORVASC Take 1 tablet (10 mg total) by mouth daily.   aspirin EC  81 MG tablet Take 81 mg by mouth daily. Swallow whole.   B-D ULTRAFINE III SHORT PEN 31G X 8 MM  Misc Generic drug: Insulin Pen Needle To use with pen basal and mealtime coverage insulin pens 5 times daily. DX: E11.65   calcium acetate 667 MG capsule Commonly known as: PHOSLO Take 1,334 mg by mouth 3 (three) times daily.   cholecalciferol 25 MCG (1000 UNIT) tablet Commonly known as: VITAMIN D3 Take 2,000 Units by mouth 2 (two) times daily.   docusate calcium 240 MG capsule Commonly known as: SURFAK Take 240 mg by mouth daily.   ezetimibe 10 MG tablet Commonly known as: ZETIA TAKE 1 TABLET(10 MG) BY MOUTH DAILY   hydrALAZINE 50 MG tablet Commonly known as: APRESOLINE Take 1 tablet (50 mg total) by mouth 3 (three) times daily.   insulin lispro 100 UNIT/ML KwikPen Commonly known as: HUMALOG Inject into the skin.   isosorbide dinitrate 20 MG tablet Commonly known as: ISORDIL Take 1 tablet (20 mg total) by mouth 3 (three) times daily.   nortriptyline 10 MG capsule Commonly known as: PAMELOR Take 50 mg by mouth at bedtime.   oxyCODONE-acetaminophen 7.5-325 MG tablet Commonly known as: PERCOCET Take 1 tablet by mouth every 4 (four) hours as needed for severe pain (pain score 7-10) or moderate pain (pain score 4-6).   pantoprazole 40 MG tablet Commonly known as: PROTONIX TAKE 1 TABLET(40 MG) BY MOUTH TWICE DAILY BEFORE A MEAL   Repatha SureClick 140 MG/ML Soaj Generic drug: Evolocumab INJECT 140 MG UNDER THE SKIN EVERY 14 DAYS   torsemide 20 MG tablet Commonly known as: DEMADEX Take 1 tablet (20 mg total) by mouth 2 (two) times daily.   Toujeo Max SoloStar 300 UNIT/ML Solostar Pen Generic drug: insulin glargine (2 Unit Dial) 60 Units daily.         Physical Exam: Constitutional: Patient appears well-developed and well-nourished. Not in obvious distress. HENT: Normocephalic, atraumatic, External right and left ear normal.  CVS: RRR, S1/S2 +, no murmurs, no gallops, no carotid bruit.  Pulmonary: Effort and breath sounds normal, no stridor, rhonchi, wheezes,  rales.  Neuro: Alert and oriented Psychiatric: Normal mood and affect. Behavior, judgment, thought content normal.   Data Review   Micro Results No results found for this or any previous visit (from the past 240 hours).   CBC No results for input(s): "WBC", "HGB", "HCT", "PLT", "MCV", "MCH", "MCHC", "RDW", "LYMPHSABS", "MONOABS", "EOSABS", "BASOSABS", "BANDABS" in the last 168 hours.  Invalid input(s): "NEUTRABS", "BANDSABD"   Chemistries  No results for input(s): "NA", "K", "CL", "CO2", "GLUCOSE", "BUN", "CREATININE", "CALCIUM", "MG", "AST", "ALT", "ALKPHOS", "BILITOT" in the last 168 hours.  Invalid input(s): "GFRCGP"  ------------------------------------------------------------------------------------------------------------------ CrCl cannot be calculated (Patient's most recent lab result is older than the maximum 21 days allowed.). ------------------------------------------------------------------------------------------------------------------ No results for input(s): "HGBA1C" in the last 72 hours.  ------------------------------------------------------------------------------------------------------------------ No results for input(s): "CHOL", "HDL", "LDLCALC", "TRIG", "CHOLHDL", "LDLDIRECT" in the last 72 hours. ------------------------------------------------------------------------------------------------------------------ No results for input(s): "TSH", "T4TOTAL", "T3FREE", "THYROIDAB" in the last 72 hours.  Invalid input(s): "FREET3"  ------------------------------------------------------------------------------------------------------------------ No results for input(s): "VITAMINB12", "FOLATE", "FERRITIN", "TIBC", "IRON", "RETICCTPCT" in the last 72 hours.  Coagulation profile No results for input(s): "INR", "PROTIME" in the last 168 hours.  No results for input(s): "DDIMER" in the last 72 hours.  Cardiac Enzymes No results for input(s): "CKMB", "TROPONINI",  "MYOGLOBIN" in the last 168 hours.  Invalid input(s): "CK" ------------------------------------------------------------------------------------------------------------------ Invalid input(s): "POCBNP"  Return for previously scheduled, AWV, [name] [name]  PCP.   Time Spent in minutes  45 Time spent with patient included reviewing progress notes, labs, imaging studies, and discussing plan for follow up.   This patient was seen by Sallyanne Kuster, FNP-C in collaboration with Dr. Beverely Risen as a part of collaborative care agreement.    Sallyanne Kuster MSN, FNP-C on 03/26/2023 at 1:35 PM   **Disclaimer: This note may have been dictated with voice recognition software. Similar sounding words can inadvertently be transcribed and this note may contain transcription errors which may not have been corrected upon publication of note.**

## 2023-03-26 NOTE — Telephone Encounter (Signed)
Patient will call back to schedule hospital follow up for [name] [name] after he checks wife's appointment dates-Toni

## 2023-03-27 DIAGNOSIS — N186 End stage renal disease: Secondary | ICD-10-CM | POA: Diagnosis not present

## 2023-03-27 DIAGNOSIS — Z992 Dependence on renal dialysis: Secondary | ICD-10-CM | POA: Diagnosis not present

## 2023-03-28 ENCOUNTER — Encounter: Payer: Self-pay | Admitting: Cardiovascular Disease

## 2023-03-28 ENCOUNTER — Telehealth: Payer: Self-pay | Admitting: Cardiovascular Disease

## 2023-03-28 DIAGNOSIS — N186 End stage renal disease: Secondary | ICD-10-CM | POA: Diagnosis not present

## 2023-03-28 DIAGNOSIS — Z992 Dependence on renal dialysis: Secondary | ICD-10-CM | POA: Diagnosis not present

## 2023-03-28 MED ORDER — HYDRALAZINE HCL 100 MG PO TABS: 100.0000 mg | ORAL_TABLET | Freq: Three times a day (TID) | ORAL | 3 refills | Status: DC

## 2023-03-28 MED ORDER — ISOSORBIDE MONONITRATE ER 30 MG PO TB24: 30.0000 mg | ORAL_TABLET | Freq: Two times a day (BID) | ORAL | 3 refills | Status: DC

## 2023-03-28 NOTE — Telephone Encounter (Signed)
Called and spoke with patient. Informed him of the following recommendations from Dr. Mariah Milling.  There was concerned about low heart rate in the hospital so clonidine and carvedilol were held To make up for these medications, we will likely need hydralazine 100 mg 3 times daily Stay on amlodipine 10 daily Would go back on isosorbide mononitrate 30 twice daily If blood pressure continues to run high we will probably need isosorbide mononitrate 60 twice daily All of these changes are kidney friendly and do not cause low heart rate Thx TG  Patient verbalizes understanding. Prescriptions sent to preferred pharmacy.

## 2023-03-28 NOTE — Telephone Encounter (Signed)
Per pt he was in the hospital and was taken off some medications and would like to speak to a nurse about this. Per pt ok to speak to wife

## 2023-03-29 DIAGNOSIS — N186 End stage renal disease: Secondary | ICD-10-CM | POA: Diagnosis not present

## 2023-03-29 DIAGNOSIS — Z992 Dependence on renal dialysis: Secondary | ICD-10-CM | POA: Diagnosis not present

## 2023-03-30 DIAGNOSIS — Z992 Dependence on renal dialysis: Secondary | ICD-10-CM | POA: Diagnosis not present

## 2023-03-30 DIAGNOSIS — N186 End stage renal disease: Secondary | ICD-10-CM | POA: Diagnosis not present

## 2023-03-31 DIAGNOSIS — Z992 Dependence on renal dialysis: Secondary | ICD-10-CM | POA: Diagnosis not present

## 2023-03-31 DIAGNOSIS — N186 End stage renal disease: Secondary | ICD-10-CM | POA: Diagnosis not present

## 2023-04-01 DIAGNOSIS — H43823 Vitreomacular adhesion, bilateral: Secondary | ICD-10-CM | POA: Diagnosis not present

## 2023-04-01 DIAGNOSIS — N186 End stage renal disease: Secondary | ICD-10-CM | POA: Diagnosis not present

## 2023-04-01 DIAGNOSIS — E113513 Type 2 diabetes mellitus with proliferative diabetic retinopathy with macular edema, bilateral: Secondary | ICD-10-CM | POA: Diagnosis not present

## 2023-04-01 DIAGNOSIS — Z992 Dependence on renal dialysis: Secondary | ICD-10-CM | POA: Diagnosis not present

## 2023-04-01 DIAGNOSIS — H35033 Hypertensive retinopathy, bilateral: Secondary | ICD-10-CM | POA: Diagnosis not present

## 2023-04-01 DIAGNOSIS — H59813 Chorioretinal scars after surgery for detachment, bilateral: Secondary | ICD-10-CM | POA: Diagnosis not present

## 2023-04-02 DIAGNOSIS — Z992 Dependence on renal dialysis: Secondary | ICD-10-CM | POA: Diagnosis not present

## 2023-04-02 DIAGNOSIS — N186 End stage renal disease: Secondary | ICD-10-CM | POA: Diagnosis not present

## 2023-04-02 DIAGNOSIS — N250 Renal osteodystrophy: Secondary | ICD-10-CM | POA: Diagnosis not present

## 2023-04-02 DIAGNOSIS — D649 Anemia, unspecified: Secondary | ICD-10-CM | POA: Diagnosis not present

## 2023-04-03 DIAGNOSIS — N186 End stage renal disease: Secondary | ICD-10-CM | POA: Diagnosis not present

## 2023-04-03 DIAGNOSIS — Z992 Dependence on renal dialysis: Secondary | ICD-10-CM | POA: Diagnosis not present

## 2023-04-04 ENCOUNTER — Encounter: Payer: Self-pay | Admitting: Cardiovascular Disease

## 2023-04-04 DIAGNOSIS — N186 End stage renal disease: Secondary | ICD-10-CM | POA: Diagnosis not present

## 2023-04-04 DIAGNOSIS — Z992 Dependence on renal dialysis: Secondary | ICD-10-CM | POA: Diagnosis not present

## 2023-04-05 DIAGNOSIS — N186 End stage renal disease: Secondary | ICD-10-CM | POA: Diagnosis not present

## 2023-04-05 DIAGNOSIS — Z992 Dependence on renal dialysis: Secondary | ICD-10-CM | POA: Diagnosis not present

## 2023-04-06 DIAGNOSIS — N186 End stage renal disease: Secondary | ICD-10-CM | POA: Diagnosis not present

## 2023-04-06 DIAGNOSIS — Z992 Dependence on renal dialysis: Secondary | ICD-10-CM | POA: Diagnosis not present

## 2023-04-07 DIAGNOSIS — Z992 Dependence on renal dialysis: Secondary | ICD-10-CM | POA: Diagnosis not present

## 2023-04-07 DIAGNOSIS — N186 End stage renal disease: Secondary | ICD-10-CM | POA: Diagnosis not present

## 2023-04-08 ENCOUNTER — Telehealth: Payer: Self-pay | Admitting: Cardiovascular Disease

## 2023-04-08 DIAGNOSIS — Z992 Dependence on renal dialysis: Secondary | ICD-10-CM | POA: Diagnosis not present

## 2023-04-08 DIAGNOSIS — N186 End stage renal disease: Secondary | ICD-10-CM | POA: Diagnosis not present

## 2023-04-08 NOTE — Telephone Encounter (Signed)
Called and spoke with patient. Patient states that he was admitted to the hospital 2 weeks ago due to hyperkalemia. Patient reports that his medications were changed while in the hospital. Patient says that since his medication changes his blood pressure has been running high. Patient reports recent medication adjustment by Dr. Mariah Milling but still no improvement in his blood pressure. Patient reports that his blood pressure is running around 180/80 and heart rate 80's-90's. Patient also complaining of cold sweats that he says started about 3 weeks ago. Patient requesting an appointment. Patient scheduled on 04/10/23.

## 2023-04-08 NOTE — Telephone Encounter (Signed)
Pt called in stating he has been having cold sweats and his BP is still around 200/100. He stated it has been this high for weeks in spite of recent medication changes. He stated he was thinking about going to the ER but would rather see provider. Please advise.

## 2023-04-09 DIAGNOSIS — Z992 Dependence on renal dialysis: Secondary | ICD-10-CM | POA: Diagnosis not present

## 2023-04-09 DIAGNOSIS — N186 End stage renal disease: Secondary | ICD-10-CM | POA: Diagnosis not present

## 2023-04-09 NOTE — Progress Notes (Deleted)
Cardiology Clinic Note   Date: 04/09/2023 ID: [name] [name], DOB 12-08-1960, MRN [id number]  Primary Cardiologist:  Julien Nordmann, MD  Patient Profile    [name] [name] is a 62 y.o. male who presents to the clinic today for ***    Past medical history significant for: CAD. CABG x 4 2018 (performed in [place]): LIMA to LAD, SVG to RCA, SVG to OM1, SVG to D1. Lexiscan Myoview 03/03/2018: Small to medium sized inferolateral infarction.  No evidence of inducible ischemia.  Mild to moderately depressed LV systolic function. PAD. Angioplasty of right SFA 10/30/2016 (performed at [place]). ABI 04/12/2022: Right ABI within normal range.  Right TBI abnormal.  Left ABI indicates mild left lower extremity arterial disease.  Left TBI is normal. Chronic systolic heart failure.   Echo 03/25/2023: EF 35 to 40%.  Mild LVH.  Grade III DD.  Elevated left atrial pressure.  Mild global hypokinesis with severe hypokinesis of the left ventricular, mid-- apical inferior wall and inferolateral wall.  Mildly reduced RV function.  Normal PA pressure.  Mild LAE.  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  Trivial AI.  Aortic valve sclerosis without stenosis.  Dilated IVC, RA pressure 8 mmHg.  (Prior echo September 2022 with EF 50 to 55%). Hypertension. Hyperlipidemia. COPD. OSA. T2DM. ESRD. On peritoneal dialysis.  In summary,  was first evaluated by Dr. Mariah Milling on 06/01/2019 to establish care at the request of [name] Ledger, NP.  Hospital records from [facility] were reviewed.  Notes indicated additional EF 35% (presumably in 2018).  EF 50% in October 2019, 40 to 45% in October 2020.  Lexiscan Myoview 2019 showed small to moderate inferolateral infarction without ischemia.  Notes indicated Entresto unable to be increased secondary to high potassium levels.      History of Present Illness    [name] [name] is followed by Dr. Mariah Milling for the above outlined history.  Patient was last seen  in the office by me on 03/07/2023 for recent increase in BP.  He denied any changes in diet, fluid retention, blurred vision, headaches, or dizziness.  He reported rapid temperature changes described as "hot flashes" coinciding with rise in BP.  Home BP ranges 124-196/60-90.  He had recently been started on clonidine by nephrology the month prior taking 0.1 mg 3 times a day with no improvement in symptoms.  Amlodipine was added.  Patient presented to the ED on 03/24/2023 via EMS for dizziness and generalized malaise.  Heart rate in triage reported as 39 bpm.  Patient reported a 1 day history of episodic dizziness lasting 8 to 10 minutes.  He attempted to check BP but was unable to get readings. EKG showed marked bradycardia with first-degree AV block, frequent PVCs in bigeminy pattern, 40 bpm.  Initial labs: Potassium 6, sodium 132, creatinine 5.84, BUN 79, hemoglobin 10.3, magnesium 2, phosphorus 6.9, TSH 4.006.  Troponin 33>> 31.  Patient received calcium gluconate and sodium bicarbonate for hyperkalemia with improvement in PVCs and no further bradycardia.  Cardiology consulted for evaluation of bradycardia and PVCs.  Carvedilol and clonidine held.  Losartan continued, amlodipine increased to 10 mg daily, hydralazine 25 mg 3 times daily added, transition from isosorbide mononitrate to isosorbide dinitrate 10 mg 3 times daily.  Echo showed worsened EF 35 to 40%, Grade III DD, mild global hypokinesis with severe hypokinesis of the left ventricular, mid-- apical inferior wall and inferolateral wall. Patient was discharged on 03/25/2023 with instructions to stop losartan.  Other changes outlined by  cardiology maintained.    Patient contacted the office on 04/08/2023 with complaints of cold sweats and BP 200/100.  He stated BP had been high for weeks despite medication changes.  He considered going to the ED but preferred to be seen by provider.  Per triage RN "Called and spoke with patient. Patient states that he  was admitted to the hospital 2 weeks ago due to hyperkalemia. Patient reports that his medications were changed while in the hospital. Patient says that since his medication changes his blood pressure has been running high. Patient reports recent medication adjustment by Dr. Mariah Milling but still no improvement in his blood pressure. Patient reports that his blood pressure is running around 180/80 and heart rate 80's-90's. Patient also complaining of cold sweats that he says started about 3 weeks ago. Patient requesting an appointment. Patient scheduled on 04/10/23."    LHC versus stress test?  Discussed the use of AI scribe software for clinical note transcription with the patient, who gave verbal consent to proceed.         Hypertension Patient recently contacted the office to report continued elevated BP.  He had recently underwent hospital admission with marked sinus bradycardia with frequent PVCs in the setting of hyperkalemia.  Rhythm improved and bradycardia resolved with electrolyte correction.  Medications were adjusted.  Home readings*** -Continue isosorbide dinitrate 10 mg 3 times daily, hydralazine 100 mg 3 times daily, amlodipine 10 mg.   CAD S/p CABG x 4 2018. Echo 03/25/2023 showed new LV dysfunction (previously recovered) with mild global hypokinesis and severe hypokinesis of the left ventricular, mid-- apical inferior wall and inferolateral wall. Patient denies chest pain, pressure, or tightness.*** -Continue aspirin, Repatha, Zetia, isosorbide.  Carvedilol recently stopped secondary to episode of bradycardia with frequent PVCs in the setting of hyperkalemia.   -Schedule***   Chronic systolic heart disease  Echo 03/25/2023 showed EF 35 to 40% (previously 50 to 55% September 2022), mild global hypokinesis with severe hypokinesis of the left ventricular, mid-- apical inferior wall and inferior lateral wall, mildly reduced RV function, moderate to severe MR.  Patient manages volume with  peritoneal dialysis. He has been intolerant of Entresto in the past secondary to hyperkalemia.  -Continue isosorbide, hydralazine, torsemide.  Losartan stopped during hospital admission secondary to hyperkalemia.   End-Stage Renal Disease on Peritoneal Dialysis Stable with no new symptoms. -Continue current management with nephrology.   ROS: All other systems reviewed and are otherwise negative except as noted in History of Present Illness.  Studies Reviewed       ***  Risk Assessment/Calculations    {Does this patient have ATRIAL FIBRILLATION?:[phone]} No BP recorded.  {Refresh Note OR Click here to enter BP  :1}***        Physical Exam    VS:  There were no vitals taken for this visit. , BMI There is no height or weight on file to calculate BMI.  GEN: Well nourished, well developed, in no acute distress. Neck: No JVD or carotid bruits. Cardiac: *** RRR. No murmurs. No rubs or gallops.   Respiratory:  Respirations regular and unlabored. Clear to auscultation without rales, wheezing or rhonchi. GI: Soft, nontender, nondistended. Extremities: Radials/DP/PT 2+ and equal bilaterally. No clubbing or cyanosis. No edema ***  Skin: Warm and dry, no rash. Neuro: Strength intact.  Assessment & Plan   Assessment and Plan               Disposition: ***     {Are you ordering  a CV Procedure (e.g. stress test, cath, DCCV, TEE, etc)?   Press F2        :[id number]}   Signed, Etta Grandchild. Forrest Jaroszewski, DNP, NP-C

## 2023-04-10 ENCOUNTER — Ambulatory Visit: Payer: Medicare Other | Admitting: Student

## 2023-04-10 ENCOUNTER — Ambulatory Visit: Payer: Medicare Other | Attending: Cardiology | Admitting: Student

## 2023-04-10 ENCOUNTER — Encounter: Payer: Self-pay | Admitting: Student

## 2023-04-10 VITALS — BP 160/70 | HR 88 | Ht 74.0 in | Wt 196.8 lb

## 2023-04-10 DIAGNOSIS — I5022 Chronic systolic (congestive) heart failure: Secondary | ICD-10-CM | POA: Diagnosis not present

## 2023-04-10 DIAGNOSIS — N186 End stage renal disease: Secondary | ICD-10-CM

## 2023-04-10 DIAGNOSIS — I10 Essential (primary) hypertension: Secondary | ICD-10-CM

## 2023-04-10 DIAGNOSIS — I2510 Atherosclerotic heart disease of native coronary artery without angina pectoris: Secondary | ICD-10-CM

## 2023-04-10 DIAGNOSIS — Z992 Dependence on renal dialysis: Secondary | ICD-10-CM | POA: Diagnosis not present

## 2023-04-10 DIAGNOSIS — Z951 Presence of aortocoronary bypass graft: Secondary | ICD-10-CM | POA: Diagnosis not present

## 2023-04-10 MED ORDER — CARVEDILOL 3.125 MG PO TABS: 3.1250 mg | ORAL_TABLET | Freq: Two times a day (BID) | ORAL | 1 refills | Status: DC

## 2023-04-10 NOTE — Patient Instructions (Signed)
Medication Instructions:  START Carvedilol 3.125 mg twice daily  *If you need a refill on your cardiac medications before your next appointment, please call your pharmacy*   Lab Work: None ordered If you have labs (blood work) drawn today and your tests are completely normal, you will receive your results only by: MyChart Message (if you have MyChart) OR A paper copy in the mail If you have any lab test that is abnormal or we need to change your treatment, we will call you to review the results.   Testing/Procedures: Your provider has ordered a Lexiscan Myoview Stress test. This will take place at [facility]. Please report to the [facility] medical mall entrance. The volunteers at the first desk will direct you where to go.  [facility] MYOVIEW  Your provider has ordered a Stress Test with nuclear imaging. The purpose of this test is to evaluate the blood supply to your heart muscle. This procedure is referred to as a "Non-Invasive Stress Test." This is because other than having an IV started in your vein, nothing is inserted or "invades" your body. Cardiac stress tests are done to find areas of poor blood flow to the heart by determining the extent of coronary artery disease (CAD). Some patients exercise on a treadmill, which naturally increases the blood flow to your heart, while others who are unable to walk on a treadmill due to physical limitations will have a pharmacologic/chemical stress agent called Lexiscan . This medicine will mimic walking on a treadmill by temporarily increasing your coronary blood flow.   Please note: these test may take anywhere between 2-4 hours to complete  How to prepare for your Myoview test:  Nothing to eat for 6 hours prior to the test No caffeine for 24 hours prior to test No smoking 24 hours prior to test. Your medication may be taken with water.  If your doctor stopped a medication because of this test, do not take that medication  -hold torsemide the morning of the  test Ladies, please do not wear dresses.  Skirts or pants are appropriate. Please wear a short sleeve shirt. No perfume, cologne or lotion. Wear comfortable walking shoes. No heels!   PLEASE NOTIFY THE OFFICE AT LEAST 24 HOURS IN ADVANCE IF YOU ARE UNABLE TO KEEP YOUR APPOINTMENT.  [phone] AND  PLEASE NOTIFY NUCLEAR MEDICINE AT [facility] AT LEAST 24 HOURS IN ADVANCE IF YOU ARE UNABLE TO KEEP YOUR APPOINTMENT. [phone]    Follow-Up: At [facility], you and your health needs are our priority.  As part of our continuing mission to provide you with exceptional heart care, we have created designated Provider Care Teams.  These Care Teams include your primary Cardiologist (physician) and Advanced Practice Providers (APPs -  Physician Assistants and Nurse Practitioners) who all work together to provide you with the care you need, when you need it.  We recommend signing up for the patient portal called "MyChart".  Sign up information is provided on this After Visit Summary.  MyChart is used to connect with patients for Virtual Visits (Telemedicine).  Patients are able to view lab/test results, encounter notes, upcoming appointments, etc.  Non-urgent messages can be sent to your provider as well.   To learn more about what you can do with MyChart, go to [email or web address].    Your next appointment:   3 weeks  Provider:   Carlos Levering, NP

## 2023-04-10 NOTE — Progress Notes (Signed)
Cardiology Clinic Note   Date: 04/10/2023 ID: Fritz Licea, DOB [birth year], MRN [id number]  Primary Cardiologist:  Julien Nordmann, MD  Patient Profile    Izael Gramza is a 62 y.o. male who presents to the clinic today for evaluation of elevated BP.     Past medical history significant for: CAD. CABG x 4 2018 (performed in [place]): LIMA to LAD, SVG to RCA, SVG to OM1, SVG to D1. Lexiscan Myoview 03/03/2018: Small to medium sized inferolateral infarction.  No evidence of inducible ischemia.  Mild to moderately depressed LV systolic function. PAD. Angioplasty of right SFA 10/30/2016 (performed at [place]). ABI 04/12/2022: Right ABI within normal range.  Right TBI abnormal.  Left ABI indicates mild left lower extremity arterial disease.  Left TBI is normal. Chronic systolic heart failure.   Echo 03/25/2023: EF 35 to 40%.  Mild LVH.  Grade III DD.  Elevated left atrial pressure.  Mild global hypokinesis with severe hypokinesis of the left ventricular, mid-- apical inferior wall and inferolateral wall.  Mildly reduced RV function.  Normal PA pressure.  Mild LAE.  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  Trivial AI.  Aortic valve sclerosis without stenosis.  Dilated IVC, RA pressure 8 mmHg.  (Prior echo September 2022 with EF 50 to 55%). Hypertension. Hyperlipidemia. COPD. OSA. T2DM. ESRD. On peritoneal dialysis.  In summary,  was first evaluated by Dr. Mariah Milling on 06/01/2019 to establish care at the request of Blima Ledger, NP.  Hospital records from [facility] were reviewed.  Notes indicated additional EF 35% (presumably in 2018).  EF 50% in October 2019, 40 to 45% in October 2020.  Lexiscan Myoview 2019 showed small to moderate inferolateral infarction without ischemia.  Notes indicated Entresto unable to be increased secondary to high potassium levels.      History of Present Illness    Rmani Salvas is followed by Dr. Mariah Milling for the above outlined  history.  Patient was last seen in the office by me on 03/07/2023 for recent increase in BP.  He denied any changes in diet, fluid retention, blurred vision, headaches, or dizziness.  He reported rapid temperature changes described as "hot flashes" coinciding with rise in BP.  Home BP ranges 124-196/60-90.  He had recently been started on clonidine by nephrology the month prior taking 0.1 mg 3 times a day with no improvement in symptoms.  Amlodipine was added.  Patient presented to the ED on 03/24/2023 via EMS for dizziness and generalized malaise.  Heart rate in triage reported as 39 bpm.  Patient reported a 1 day history of episodic dizziness lasting 8 to 10 minutes.  He attempted to check BP but was unable to get readings. EKG showed marked bradycardia with first-degree AV block, frequent PVCs in bigeminy pattern, 40 bpm.  Initial labs: Potassium 6, sodium 132, creatinine 5.84, BUN 79, hemoglobin 10.3, magnesium 2, phosphorus 6.9, TSH 4.006.  Troponin 33>> 31.  Patient received calcium gluconate and sodium bicarbonate for hyperkalemia with improvement in PVCs and no further bradycardia.  Cardiology consulted for evaluation of bradycardia and PVCs.  Carvedilol and clonidine held.  Losartan continued, amlodipine increased to 10 mg daily, hydralazine 25 mg 3 times daily added, transition from isosorbide mononitrate to isosorbide dinitrate 10 mg 3 times daily.  Echo showed worsened EF 35 to 40%, Grade III DD, mild global hypokinesis with severe hypokinesis of the left ventricular, mid-- apical inferior wall and inferolateral wall. Patient was discharged on 03/25/2023 with instructions to stop losartan.  Other changes outlined by cardiology maintained.    Patient contacted the office on 04/08/2023 with complaints of cold sweats and BP 200/100.  He stated BP had been high for weeks despite medication changes.  He considered going to the ED but preferred to be seen by provider.  Per triage RN "Called and spoke  with patient. Patient states that he was admitted to the hospital 2 weeks ago due to hyperkalemia. Patient reports that his medications were changed while in the hospital. Patient says that since his medication changes his blood pressure has been running high. Patient reports recent medication adjustment by Dr. Mariah Milling but still no improvement in his blood pressure. Patient reports that his blood pressure is running around 180/80 and heart rate 80's-90's. Patient also complaining of cold sweats that he says started about 3 weeks ago. Patient requesting an appointment. Patient scheduled on 04/10/23."   Discussed the use of AI scribe software for clinical note transcription with the patient, who gave verbal consent to proceed.  The patient presents with concerns about persistently high blood pressure despite recent changes in medication. Patient reports he contacted the office after his hospital visit for SBP in the 200s. He was instructed to stop isosorbide dinitrate and resume isosorbide mononitrate bid. BP improved somewhat but still consistently >150/70.  Heart rate has been in the 80s and 90s since hospital discharge. Patient reports he gets ringing in his ears when his BP is elevated. No headache, dizziness, or change in vision. Patient denies shortness of breath, dyspnea on exertion, lower extremity edema, orthopnea or PND. No chest pain, pressure, or tightness. No palpitations.   Discussed results of echo and need for ischemic evaluation. He is concerned about doing a heart catheterization secondary to his kidney failure.         ROS: All other systems reviewed and are otherwise negative except as noted in History of Present Illness.  Studies Reviewed    EKG Interpretation Date/Time:  Thursday April 10 2023 14:02:35 EST Ventricular Rate:  88 PR Interval:  198 QRS Duration:  124 QT Interval:  406 QTC Calculation: 491 R Axis:   33  Text Interpretation: Sinus rhythm with Premature atrial  complexes with Abberant conduction Left ventricular hypertrophy with QRS widening and repolarization abnormality ( Cornell product ) Possible Inferior infarct , age undetermined When compared with ECG of 24-Mar-2023 11:27, PREVIOUS ECG IS PRESENT Confirmed by Carlos Levering [phone]) on 04/10/2023 2:06:58 PM   Risk Assessment/Calculations      HYPERTENSION CONTROL Vitals:   04/10/23 1355 04/10/23 1723  BP: (!) 150/74 (!) 160/70    The patient's blood pressure is elevated above target today.  In order to address the patient's elevated BP: A new medication was prescribed today.           Physical Exam    VS:  BP (!) 160/70 (BP Location: Right Arm, Patient Position: Sitting, Cuff Size: Normal)   Pulse 88   Ht 6\' 2"  (1.88 m)   Wt 196 lb 12.8 oz (89.3 kg)   SpO2 95%   BMI 25.27 kg/m  , BMI Body mass index is 25.27 kg/m.  GEN: Well nourished, well developed, in no acute distress. Neck: No JVD or carotid bruits. Cardiac:  RRR. No murmurs. No rubs or gallops.   Respiratory:  Respirations regular and unlabored. Clear to auscultation without rales, wheezing or rhonchi. GI: Soft, nontender, nondistended. Extremities: Radials/DP/PT 2+ and equal bilaterally. No clubbing or cyanosis. No edema.  Skin: Warm and dry,  no rash. Neuro: Strength intact.  Assessment & Plan      Hypertension Patient recently contacted the office to report continued elevated BP.  He had recently underwent hospital admission with marked sinus bradycardia with frequent PVCs in the setting of hyperkalemia.  Rhythm improved and bradycardia resolved with electrolyte correction.  Medications were adjusted.  Home readings were initially >200 after hospital discharge. He contacted the office and isosorbide dinitrate was changed back to isosorbide mononitrate. He had some improvement but BP still consistently >150/70.  His heart rate has been in the 80s and 90s since hospital discharge. He reports ringing in his ears when  his BP is elevated. No dizziness, headaches or vision changes. BP today 150/74 on intake and 160/70 on my recheck. HR 88 bpm.  -Carvedilol 3.125 mg bid. He will check his HR prior to taking dose and hold if HR <60.  -Continue isosorbide mononitrate 30 mg 2 times daily, hydralazine 100 mg 3 times daily, amlodipine 10 mg.   CAD S/p CABG x 4 2018. Echo 03/25/2023 showed new LV dysfunction (previously recovered) with mild global hypokinesis and severe hypokinesis of the left ventricular, mid-- apical inferior wall and inferolateral wall. Patient denies chest pain, pressure, or tightness. -Continue aspirin, Repatha, Zetia, isosorbide.  -Start carvedilol as above.  -Schedule nuclear stress test.    Chronic systolic heart disease  Echo 03/25/2023 showed EF 35 to 40% (previously 50 to 55% September 2022), mild global hypokinesis with severe hypokinesis of the left ventricular, mid-- apical inferior wall and inferior lateral wall, mildly reduced RV function, moderate to severe MR.  Patient manages volume with peritoneal dialysis. He has been intolerant of Entresto in the past secondary to hyperkalemia.  -Continue isosorbide, hydralazine, torsemide.  Losartan stopped during hospital admission secondary to hyperkalemia. -Start carvedilol as above.  -Schedule nuclear stress test for ischemic evaluation in the setting of decreased EF.    End-Stage Renal Disease on Peritoneal Dialysis Stable with no new symptoms. -Continue current management with nephrology.        Disposition: Nuclear stress test. Carvedilol 3.125 mg bid. Return in 3-4 weeks after testing.      Informed Consent   Shared Decision Making/Informed Consent The risks [stroke (1 in 1000), death (1 in 1000), kidney failure [usually temporary] (1 in 500), bleeding (1 in 200), allergic reaction [possibly serious] (1 in 200)], benefits (diagnostic support and management of coronary artery disease) and alternatives of a cardiac catheterization  were discussed in detail with Mr. Montemarano and he is willing to proceed.      Signed, Etta Grandchild. Antino Mayabb, DNP, NP-C

## 2023-04-11 ENCOUNTER — Ambulatory Visit (INDEPENDENT_AMBULATORY_CARE_PROVIDER_SITE_OTHER): Payer: Managed Care, Other (non HMO) | Admitting: Vascular Surgery

## 2023-04-11 DIAGNOSIS — Z992 Dependence on renal dialysis: Secondary | ICD-10-CM | POA: Diagnosis not present

## 2023-04-11 DIAGNOSIS — N186 End stage renal disease: Secondary | ICD-10-CM | POA: Diagnosis not present

## 2023-04-12 DIAGNOSIS — N186 End stage renal disease: Secondary | ICD-10-CM | POA: Diagnosis not present

## 2023-04-12 DIAGNOSIS — Z992 Dependence on renal dialysis: Secondary | ICD-10-CM | POA: Diagnosis not present

## 2023-04-13 DIAGNOSIS — Z992 Dependence on renal dialysis: Secondary | ICD-10-CM | POA: Diagnosis not present

## 2023-04-13 DIAGNOSIS — N186 End stage renal disease: Secondary | ICD-10-CM | POA: Diagnosis not present

## 2023-04-14 DIAGNOSIS — N186 End stage renal disease: Secondary | ICD-10-CM | POA: Diagnosis not present

## 2023-04-14 DIAGNOSIS — Z992 Dependence on renal dialysis: Secondary | ICD-10-CM | POA: Diagnosis not present

## 2023-04-15 ENCOUNTER — Other Ambulatory Visit: Payer: Self-pay

## 2023-04-15 DIAGNOSIS — Z992 Dependence on renal dialysis: Secondary | ICD-10-CM | POA: Diagnosis not present

## 2023-04-15 DIAGNOSIS — N186 End stage renal disease: Secondary | ICD-10-CM | POA: Diagnosis not present

## 2023-04-15 DIAGNOSIS — E118 Type 2 diabetes mellitus with unspecified complications: Secondary | ICD-10-CM | POA: Diagnosis not present

## 2023-04-15 DIAGNOSIS — Z794 Long term (current) use of insulin: Secondary | ICD-10-CM | POA: Diagnosis not present

## 2023-04-15 MED ORDER — ALBUTEROL SULFATE HFA 108 (90 BASE) MCG/ACT IN AERS: 1.0000 | INHALATION_SPRAY | Freq: Four times a day (QID) | RESPIRATORY_TRACT | 3 refills | Status: DC | PRN

## 2023-04-16 ENCOUNTER — Encounter: Payer: Self-pay | Admitting: Nephrology

## 2023-04-16 DIAGNOSIS — N186 End stage renal disease: Secondary | ICD-10-CM | POA: Diagnosis not present

## 2023-04-16 DIAGNOSIS — Z992 Dependence on renal dialysis: Secondary | ICD-10-CM | POA: Diagnosis not present

## 2023-04-16 DIAGNOSIS — E875 Hyperkalemia: Secondary | ICD-10-CM

## 2023-04-17 ENCOUNTER — Encounter
Admission: RE | Admit: 2023-04-17 | Discharge: 2023-04-17 | Disposition: A | Discharge status: Home / Self Care | Payer: Medicare Other | Source: Ambulatory Visit | Attending: Student | Admitting: Student

## 2023-04-17 DIAGNOSIS — I5022 Chronic systolic (congestive) heart failure: Secondary | ICD-10-CM | POA: Insufficient documentation

## 2023-04-17 DIAGNOSIS — Z992 Dependence on renal dialysis: Secondary | ICD-10-CM | POA: Diagnosis not present

## 2023-04-17 DIAGNOSIS — N186 End stage renal disease: Secondary | ICD-10-CM | POA: Diagnosis not present

## 2023-04-17 MED ORDER — TECHNETIUM TC 99M TETROFOSMIN IV KIT
10.8500 | PACK | Freq: Once | INTRAVENOUS | Status: AC | PRN
  Administered 2023-04-17: 10.85 via INTRAVENOUS

## 2023-04-17 MED ORDER — REGADENOSON 0.4 MG/5ML IV SOLN
0.4000 mg | Freq: Once | INTRAVENOUS | Status: AC
  Administered 2023-04-17: 0.4 mg via INTRAVENOUS

## 2023-04-17 MED ORDER — TECHNETIUM TC 99M TETROFOSMIN IV KIT
32.4000 | PACK | Freq: Once | INTRAVENOUS | Status: AC | PRN
  Administered 2023-04-17: 31.4 via INTRAVENOUS

## 2023-04-18 ENCOUNTER — Inpatient Hospital Stay: Admission: RE | Admit: 2023-04-18 | Payer: Medicare Other | Source: Ambulatory Visit

## 2023-04-18 DIAGNOSIS — N186 End stage renal disease: Secondary | ICD-10-CM | POA: Diagnosis not present

## 2023-04-18 DIAGNOSIS — Z992 Dependence on renal dialysis: Secondary | ICD-10-CM | POA: Diagnosis not present

## 2023-04-18 LAB — NM MYOCAR MULTI W/SPECT W/WALL MOTION / EF
LV dias vol: 179 mL (ref 62–150)
LV sys vol: 116 mL
MPHR: 157 {beats}/min
Nuc Stress EF: 35 %
Peak HR: 75 {beats}/min
Percent HR: 47 %
Rest HR: 75 {beats}/min
Rest Nuclear Isotope Dose: 10.9 mCi
SDS: 1
SRS: 12
SSS: 8
Stress Nuclear Isotope Dose: 31.4 mCi
TID: 0.97

## 2023-04-19 DIAGNOSIS — Z992 Dependence on renal dialysis: Secondary | ICD-10-CM | POA: Diagnosis not present

## 2023-04-19 DIAGNOSIS — N186 End stage renal disease: Secondary | ICD-10-CM | POA: Diagnosis not present

## 2023-04-20 DIAGNOSIS — Z992 Dependence on renal dialysis: Secondary | ICD-10-CM | POA: Diagnosis not present

## 2023-04-20 DIAGNOSIS — N186 End stage renal disease: Secondary | ICD-10-CM | POA: Diagnosis not present

## 2023-04-21 DIAGNOSIS — Z992 Dependence on renal dialysis: Secondary | ICD-10-CM | POA: Diagnosis not present

## 2023-04-21 DIAGNOSIS — N186 End stage renal disease: Secondary | ICD-10-CM | POA: Diagnosis not present

## 2023-04-22 DIAGNOSIS — Z992 Dependence on renal dialysis: Secondary | ICD-10-CM | POA: Diagnosis not present

## 2023-04-22 DIAGNOSIS — N186 End stage renal disease: Secondary | ICD-10-CM | POA: Diagnosis not present

## 2023-04-23 DIAGNOSIS — N186 End stage renal disease: Secondary | ICD-10-CM | POA: Diagnosis not present

## 2023-04-23 DIAGNOSIS — Z992 Dependence on renal dialysis: Secondary | ICD-10-CM | POA: Diagnosis not present

## 2023-04-24 DIAGNOSIS — N186 End stage renal disease: Secondary | ICD-10-CM | POA: Diagnosis not present

## 2023-04-24 DIAGNOSIS — Z992 Dependence on renal dialysis: Secondary | ICD-10-CM | POA: Diagnosis not present

## 2023-04-25 DIAGNOSIS — Z992 Dependence on renal dialysis: Secondary | ICD-10-CM | POA: Diagnosis not present

## 2023-04-25 DIAGNOSIS — N186 End stage renal disease: Secondary | ICD-10-CM | POA: Diagnosis not present

## 2023-04-26 DIAGNOSIS — N186 End stage renal disease: Secondary | ICD-10-CM | POA: Diagnosis not present

## 2023-04-26 DIAGNOSIS — Z992 Dependence on renal dialysis: Secondary | ICD-10-CM | POA: Diagnosis not present

## 2023-04-27 DIAGNOSIS — N186 End stage renal disease: Secondary | ICD-10-CM | POA: Diagnosis not present

## 2023-04-27 DIAGNOSIS — Z992 Dependence on renal dialysis: Secondary | ICD-10-CM | POA: Diagnosis not present

## 2023-04-28 DIAGNOSIS — N186 End stage renal disease: Secondary | ICD-10-CM | POA: Diagnosis not present

## 2023-04-28 DIAGNOSIS — Z992 Dependence on renal dialysis: Secondary | ICD-10-CM | POA: Diagnosis not present

## 2023-04-29 ENCOUNTER — Encounter (INDEPENDENT_AMBULATORY_CARE_PROVIDER_SITE_OTHER): Payer: Self-pay | Admitting: Vascular Surgery

## 2023-04-29 ENCOUNTER — Ambulatory Visit (INDEPENDENT_AMBULATORY_CARE_PROVIDER_SITE_OTHER): Payer: Medicare Other | Admitting: Vascular Surgery

## 2023-04-29 VITALS — BP 178/81 | HR 83 | Resp 18 | Ht 74.0 in | Wt 196.0 lb

## 2023-04-29 DIAGNOSIS — I10 Essential (primary) hypertension: Secondary | ICD-10-CM

## 2023-04-29 DIAGNOSIS — Z794 Long term (current) use of insulin: Secondary | ICD-10-CM

## 2023-04-29 DIAGNOSIS — E1140 Type 2 diabetes mellitus with diabetic neuropathy, unspecified: Secondary | ICD-10-CM

## 2023-04-29 DIAGNOSIS — I739 Peripheral vascular disease, unspecified: Secondary | ICD-10-CM | POA: Diagnosis not present

## 2023-04-29 DIAGNOSIS — Z992 Dependence on renal dialysis: Secondary | ICD-10-CM | POA: Diagnosis not present

## 2023-04-29 DIAGNOSIS — I890 Lymphedema, not elsewhere classified: Secondary | ICD-10-CM | POA: Diagnosis not present

## 2023-04-29 DIAGNOSIS — N186 End stage renal disease: Secondary | ICD-10-CM

## 2023-04-29 NOTE — Assessment & Plan Note (Signed)
Doing PD

## 2023-04-29 NOTE — Assessment & Plan Note (Signed)
Stable, no new symptoms.  Recheck ABIs in 6 months.

## 2023-04-29 NOTE — Progress Notes (Signed)
MRN : [id number]  [name] [name] is a 62 y.o. (Sep 07, 1960) male who presents with chief complaint of  Chief Complaint  Patient presents with   Follow-up    1 year no studies.  .  History of Present Illness: Patient returns today in follow up of PAD and leg swelling. He is doing well.  His swelling is under good control.  He hasn't used his lymphedema pump in many months now and wants to donate it.  No new ulcers or infections. No fever or chills.  No rest pain.   Current Outpatient Medications  Medication Sig Dispense Refill   albuterol (VENTOLIN HFA) 108 (90 Base) MCG/ACT inhaler Inhale 1-2 puffs into the lungs every 6 (six) hours as needed for wheezing or shortness of breath. 1 each 3   amLODipine (NORVASC) 10 MG tablet Take 1 tablet (10 mg total) by mouth daily. 30 tablet 0   aspirin EC 81 MG tablet Take 81 mg by mouth daily. Swallow whole.     calcium acetate (PHOSLO) 667 MG capsule Take 1,334 mg by mouth 3 (three) times daily.     carvedilol (COREG) 3.125 MG tablet Take 1 tablet (3.125 mg total) by mouth 2 (two) times daily. 180 tablet 1   cholecalciferol (VITAMIN D3) 25 MCG (1000 UNIT) tablet Take 2,000 Units by mouth 2 (two) times daily.     docusate calcium (SURFAK) 240 MG capsule Take 240 mg by mouth daily.     Evolocumab (REPATHA SURECLICK) 140 MG/ML SOAJ INJECT 140 MG UNDER THE SKIN EVERY 14 DAYS 6 mL 3   ezetimibe (ZETIA) 10 MG tablet TAKE 1 TABLET(10 MG) BY MOUTH DAILY 90 tablet 2   hydrALAZINE (APRESOLINE) 100 MG tablet Take 1 tablet (100 mg total) by mouth 3 (three) times daily. 270 tablet 3   insulin lispro (HUMALOG) 100 UNIT/ML KwikPen Inject into the skin.     Insulin Pen Needle (B-D ULTRAFINE III SHORT PEN) 31G X 8 MM MISC To use with pen basal and mealtime coverage insulin pens 5 times daily. DX: E11.65 500 each 1   isosorbide mononitrate (IMDUR) 30 MG 24 hr tablet Take 1 tablet (30 mg total) by mouth 2 (two) times daily. 180 tablet 3   nortriptyline (PAMELOR) 10  MG capsule Take 50 mg by mouth at bedtime.     oxyCODONE-acetaminophen (PERCOCET) 7.5-325 MG tablet Take 1 tablet by mouth every 4 (four) hours as needed for severe pain (pain score 7-10) or moderate pain (pain score 4-6).     pantoprazole (PROTONIX) 40 MG tablet TAKE 1 TABLET(40 MG) BY MOUTH TWICE DAILY BEFORE A MEAL 180 tablet 3   torsemide (DEMADEX) 20 MG tablet Take 1 tablet (20 mg total) by mouth 2 (two) times daily. 180 tablet 2   TOUJEO MAX SOLOSTAR 300 UNIT/ML Solostar Pen 60 Units daily.     No current facility-administered medications for this visit.    Past Medical History:  Diagnosis Date   Anemia    Arthritis    CAD (coronary artery disease)    a. 12/2016 s/p CABG x 4 (LIMA->LAD, VG->RCA, VG->OM1, VG->D1); b. 02/2018 MV: EF 35%, small-med inferolaterlal infarct. No ischemia.   CKD (chronic kidney disease), stage IV (HCC)    Peritoneal Dialysis   Colon polyps    COPD (chronic obstructive pulmonary disease) (HCC)    Dupre's syndrome    GERD (gastroesophageal reflux disease)    HFrEF (heart failure reduced ejection fraction) (HCC)    a.) 2018 EF 35%; b.)  02/2018 EF 50%; c). 02/2019 EF 40-45%; d.) 08/2019 Echo: EF 50-55%, no rwma, GrII DD; e.) 04/2020 Echo: EF 30-35%, mod-sev glob HK. Mild LVH. G2DD. Low-nl RV fxn. Mildly dil LA. Mild MR. Mild-mod Ao sclerosis w/o stenosis; f.) TTE 02/09/2021: EF 55%; septal HK, LAE; G2DD.   History of 2019 novel coronavirus disease (COVID-19) 02/08/2021   Hyperlipidemia    Hypertension    Ischemic cardiomyopathy    a.) 02/2018 MV: EF 35%; b.) 08/2019 Echo: EF 50-55%; c.) 04/2020 Echo: EF 30-35%; d.) TTE 02/09/2021: EF 50-55%.   Lymphedema    Migraine    Myocardial infarction [facility])    Neuropathy    PAD (peripheral artery disease) (HCC)    a. 04/2017 Aortoiliac duplex: R iliac dzs; b. 03/2020 LE Duplex: mild bilat atherosclerosis throughout. Patent vessels.   Pneumonia    Retinopathy    S/P CABG x 4    a.)  4v CABG: LIMA->LAD, SVG->RCA,  SVG->OM1, SVG->D1   Sleep apnea    a.) does not require nocturnal PAP therapy   Statin intolerance    Stomach ulcer    T2DM (type 2 diabetes mellitus) (HCC)     Past Surgical History:  Procedure Laterality Date   bone graft surgery Bilateral    on both feet x 2   CAPD INSERTION N/A 11/13/2021   Procedure: LAPAROSCOPIC INSERTION CONTINUOUS AMBULATORY PERITONEAL DIALYSIS  (CAPD) CATHETER;  Surgeon: Leafy Ro, MD;  Location: [facility] ORS;  Service: General;  Laterality: N/A;   CAPD REVISION N/A 01/10/2022   Procedure: LAPAROSCOPIC REVISION CONTINUOUS AMBULATORY PERITONEAL DIALYSIS  (CAPD) CATHETER;  Surgeon: Leafy Ro, MD;  Location: [facility] OR[facility];  Service: General;  Laterality: N/A;   CARDIAC CATHETERIZATION  2013   S/p PCI    CARDIAC CATHETERIZATION  2018   S/p CABG   CATARACT EXTRACTION Bilateral    COLONOSCOPY     COLONOSCOPY WITH PROPOFOL N/A 10/17/2022   Procedure: COLONOSCOPY WITH PROPOFOL;  Surgeon: [name], MD;  Location: [facility];  Service: Endoscopy;  Laterality: N/A;   CORONARY ARTERY BYPASS GRAFT  2018   (LIMA-LAD,VG-RCA,VG-OM1,VG-D1)   ESOPHAGOGASTRODUODENOSCOPY (EGD) WITH PROPOFOL N/A 04/04/2020   Procedure: ESOPHAGOGASTRODUODENOSCOPY (EGD) WITH PROPOFOL;  Surgeon: [name], MD;  Location: [facility];  Service: Endoscopy;  Laterality: N/A;   ESOPHAGOGASTRODUODENOSCOPY (EGD) WITH PROPOFOL N/A 06/06/2020   Procedure: ESOPHAGOGASTRODUODENOSCOPY (EGD) WITH PROPOFOL;  Surgeon: [name], MD;  Location: [facility];  Service: Endoscopy;  Laterality: N/A;   ESOPHAGOGASTRODUODENOSCOPY (EGD) WITH PROPOFOL N/A 09/24/2022   Procedure: ESOPHAGOGASTRODUODENOSCOPY (EGD) WITH PROPOFOL;  Surgeon: [name], MD;  Location: [facility];  Service: Endoscopy;  Laterality: N/A;   PERIPHERAL VASCULAR CATHETERIZATION Right 10/28/2016   PTA/DEB Right SFA   WRIST SURGERY Left    cyst     Social History   Tobacco Use   Smoking status: Former    Current  packs/day: 0.25    Average packs/day: 0.3 packs/day for 39.0 years (9.8 ttl pk-yrs)    Types: Cigarettes    Passive exposure: Past   Smokeless tobacco: Never  Vaping Use   Vaping status: Never Used  Substance Use Topics   Alcohol use: Not Currently    Comment: very rarely - wine   Drug use: Never      Family History  Problem Relation Age of Onset   Diabetes Mother    Heart disease Father   No bleeding or clotting disorders  Allergies  Allergen Reactions   Iodinated Contrast Media Other (See Comments)  Kidney disease  Kidney disease  Kidney disease  Kidney disease   Other Other (See Comments)    Pain  With joint stiffness     Statins Other (See Comments)    Pain  With joint stiffness  Pain  With joint stiffness    Pregabalin Other (See Comments)    Generalized aches and pains Generalized aches and pains Generalized aches and pains Generalized aches and pains Generalized aches and pains   Sacubitril-Valsartan Other (See Comments)    Hyperkalemia  Hyperkalemia Hyperkalemia  Hyperkalemia   Atorvastatin Other (See Comments)    Muscle aches Muscle aches   Pravastatin Other (See Comments)    Muscle aches Muscle aches   Rosuvastatin Other (See Comments)    Muscle Aches Other reaction(s): JOINT PAIN Other reaction(s): JOINT PAIN Muscle Aches      REVIEW OF SYSTEMS (Negative unless checked)   Constitutional: [] Weight loss  [] Fever  [] Chills Cardiac: [] Chest pain   [] Chest pressure   [x] Palpitations   [] Shortness of breath when laying flat   [] Shortness of breath at rest   [x] Shortness of breath with exertion. Vascular:  [] Pain in legs with walking   [] Pain in legs at rest   [] Pain in legs when laying flat   [] Claudication   [] Pain in feet when walking  [] Pain in feet at rest  [] Pain in feet when laying flat   [] History of DVT   [] Phlebitis   [x] Swelling in legs   [] Varicose veins   [] Non-healing ulcers Pulmonary:   [] Uses home oxygen   [] Productive cough    [] Hemoptysis   [] Wheeze  [] COPD   [] Asthma Neurologic:  [] Dizziness  [] Blackouts   [] Seizures   [] History of stroke   [] History of TIA  [] Aphasia   [] Temporary blindness   [] Dysphagia   [] Weakness or numbness in arms   [x] Weakness or numbness in legs Musculoskeletal:  [] Arthritis   [] Joint swelling   [] Joint pain   [] Low back pain Hematologic:  [] Easy bruising  [] Easy bleeding   [] Hypercoagulable state   [x] Anemic  [] Hepatitis Gastrointestinal:  [] Blood in stool   [] Vomiting blood  [] Gastroesophageal reflux/heartburn   [] Abdominal pain Genitourinary:  [x] Chronic kidney disease   [] Difficult urination  [] Frequent urination  [] Burning with urination   [] Hematuria Skin:  [] Rashes   [] Ulcers   [] Wounds Psychological:  [] History of anxiety   []  History of major depression.    Physical Examination  BP (!) 178/81 (BP Location: Right Arm)   Pulse 83   Resp 18   Ht 6\' 2"  (1.88 m)   Wt 196 lb (88.9 kg)   BMI 25.16 kg/m  Gen:  WD/WN, NAD Head: [place]/AT, No temporalis wasting. Ear/Nose/Throat: Hearing grossly intact, nares w/o erythema or drainage Eyes: Conjunctiva clear. Sclera non-icteric Neck: Supple.  Trachea midline Pulmonary:  Good air movement, no use of accessory muscles.  Cardiac: RRR, no JVD Vascular:  Vessel Right Left  Radial Palpable Palpable                          PT 1+ Palpable 1+ Palpable  DP 1+ Palpable 2+ Palpable   Gastrointestinal: soft, non-tender/non-distended. PD catheter in place Musculoskeletal: M/S 5/5 throughout.  No deformity or atrophy. Trace to 1+ RLE edema, no LLE edema. Neurologic: Sensation grossly intact in extremities. Speech is fluent.  Psychiatric: Judgment intact, Mood & affect appropriate for pt's clinical situation. Dermatologic: No rashes or ulcers noted.  No cellulitis or open wounds.  Labs Recent Results (from the past 2160 hour(s))  Comprehensive metabolic panel     Status: Abnormal   Collection Time: 03/24/23 11:38 AM  Result  Value Ref Range   Sodium 132 (L) 135 - 145 mmol/L   Potassium 6.0 (H) 3.5 - 5.1 mmol/L   Chloride 101 98 - 111 mmol/L   CO2 20 (L) 22 - 32 mmol/L   Glucose, Bld 153 (H) 70 - 99 mg/dL    Comment: Glucose reference range applies only to samples taken after fasting for at least 8 hours.   BUN 79 (H) 8 - 23 mg/dL   Creatinine, Ser 3.08 (H) 0.61 - 1.24 mg/dL   Calcium 7.9 (L) 8.9 - 10.3 mg/dL   Total Protein 6.1 (L) 6.5 - 8.1 g/dL   Albumin 3.1 (L) 3.5 - 5.0 g/dL   AST 18 15 - 41 U/L   ALT 17 0 - 44 U/L   Alkaline Phosphatase 51 38 - 126 U/L   Total Bilirubin 0.9 0.3 - 1.2 mg/dL   GFR, Estimated 10 (L) >60 mL/min    Comment: (NOTE) Calculated using the CKD-EPI Creatinine Equation (2021)    Anion gap 11 5 - 15    Comment: Performed at [facility], [address], [place], [place] [address]  Troponin I (High Sensitivity)     Status: Abnormal   Collection Time: 03/24/23 11:38 AM  Result Value Ref Range   Troponin I (High Sensitivity) 33 (H) <18 ng/L    Comment: (NOTE) Elevated high sensitivity troponin I (hsTnI) values and significant  changes across serial measurements may suggest ACS but many other  chronic and acute conditions are known to elevate hsTnI results.  Refer to the "Links" section for chest pain algorithms and additional  guidance. Performed at [facility], [address] [place], [place] [address]   CBC with Differential     Status: Abnormal   Collection Time: 03/24/23 11:38 AM  Result Value Ref Range   WBC 9.3 4.0 - 10.5 K/uL   RBC 3.28 (L) 4.22 - 5.81 MIL/uL   Hemoglobin 10.3 (L) 13.0 - 17.0 g/dL   HCT 52.8 (L) 41.3 - 24.4 %   MCV 92.4 80.0 - 100.0 fL   MCH 31.4 26.0 - 34.0 pg   MCHC 34.0 30.0 - 36.0 g/dL   RDW 01.0 27.2 - 53.6 %   Platelets 237 150 - 400 K/uL   nRBC 0.0 0.0 - 0.2 %   Neutrophils Relative % 67 %   Neutro Abs 6.2 1.7 - 7.7 K/uL   Lymphocytes Relative 19 %   Lymphs Abs 1.7 0.7 - 4.0 K/uL   Monocytes Relative 8 %    Monocytes Absolute 0.8 0.1 - 1.0 K/uL   Eosinophils Relative 4 %   Eosinophils Absolute 0.4 0.0 - 0.5 K/uL   Basophils Relative 1 %   Basophils Absolute 0.1 0.0 - 0.1 K/uL   Immature Granulocytes 1 %   Abs Immature Granulocytes 0.05 0.00 - 0.07 K/uL    Comment: Performed at [facility], [address], [place], [place] [address]  Magnesium     Status: None   Collection Time: 03/24/23 11:38 AM  Result Value Ref Range   Magnesium 2.0 1.7 - 2.4 mg/dL    Comment: Performed at [facility], [address], [place], [place] [address]  Phosphorus     Status: Abnormal   Collection Time: 03/24/23 11:38 AM  Result Value Ref Range   Phosphorus 6.9 (H)  2.5 - 4.6 mg/dL    Comment: Performed at [facility], [address] [place], [place] [address]  TSH     Status: None   Collection Time: 03/24/23 11:38 AM  Result Value Ref Range   TSH 4.006 0.350 - 4.500 uIU/mL    Comment: Performed by a 3rd Generation assay with a functional sensitivity of <=0.01 uIU/mL. Performed at [facility], [address] [place], [place] [address]   CBG monitoring, ED     Status: Abnormal   Collection Time: 03/24/23  5:12 PM  Result Value Ref Range   Glucose-Capillary 190 (H) 70 - 99 mg/dL    Comment: Glucose reference range applies only to samples taken after fasting for at least 8 hours.  Troponin I (High Sensitivity)     Status: Abnormal   Collection Time: 03/24/23  7:01 PM  Result Value Ref Range   Troponin I (High Sensitivity) 31 (H) <18 ng/L    Comment: (NOTE) Elevated high sensitivity troponin I (hsTnI) values and significant  changes across serial measurements may suggest ACS but many other  chronic and acute conditions are known to elevate hsTnI results.  Refer to the "Links" section for chest pain algorithms and additional  guidance. Performed at [facility], [address] [place], [place] [address]   Glucose, capillary     Status: Abnormal    Collection Time: 03/24/23 10:16 PM  Result Value Ref Range   Glucose-Capillary 187 (H) 70 - 99 mg/dL    Comment: Glucose reference range applies only to samples taken after fasting for at least 8 hours.  Basic metabolic panel     Status: Abnormal   Collection Time: 03/25/23  4:42 AM  Result Value Ref Range   Sodium 134 (L) 135 - 145 mmol/L   Potassium 4.1 3.5 - 5.1 mmol/L   Chloride 100 98 - 111 mmol/L   CO2 24 22 - 32 mmol/L   Glucose, Bld 143 (H) 70 - 99 mg/dL    Comment: Glucose reference range applies only to samples taken after fasting for at least 8 hours.   BUN 80 (H) 8 - 23 mg/dL   Creatinine, Ser 7.37 (H) 0.61 - 1.24 mg/dL   Calcium 8.1 (L) 8.9 - 10.3 mg/dL   GFR, Estimated 11 (L) >60 mL/min    Comment: (NOTE) Calculated using the CKD-EPI Creatinine Equation (2021)    Anion gap 10 5 - 15    Comment: Performed at [facility], [address] [place], [place] [address]  CBC     Status: Abnormal   Collection Time: 03/25/23  4:42 AM  Result Value Ref Range   WBC 9.1 4.0 - 10.5 K/uL   RBC 3.63 (L) 4.22 - 5.81 MIL/uL   Hemoglobin 11.2 (L) 13.0 - 17.0 g/dL   HCT 94.8 (L) 54.6 - 27.0 %   MCV 90.1 80.0 - 100.0 fL   MCH 30.9 26.0 - 34.0 pg   MCHC 34.3 30.0 - 36.0 g/dL   RDW 35.0 09.3 - 81.8 %   Platelets 232 150 - 400 K/uL   nRBC 0.0 0.0 - 0.2 %    Comment: Performed at [facility], [address] [place], [place] [address]  Hemoglobin A1c     Status: Abnormal   Collection Time: 03/25/23  4:42 AM  Result Value Ref Range   Hgb A1c MFr Bld 7.6 (H) 4.8 - 5.6 %    Comment: (NOTE) Pre diabetes:  5.7%-6.4%  Diabetes:              >6.4%  Glycemic control for   <7.0% adults with diabetes    Mean Plasma Glucose 171.42 mg/dL    Comment: Performed at [facility], [address], [place], [place] [address]  Glucose, capillary     Status: None   Collection Time: 03/25/23  8:21 AM  Result Value Ref Range   Glucose-Capillary 91 70 - 99 mg/dL     Comment: Glucose reference range applies only to samples taken after fasting for at least 8 hours.  ECHOCARDIOGRAM COMPLETE     Status: None   Collection Time: 03/25/23 10:39 AM  Result Value Ref Range   Weight 3,019.42 oz   Height 73 in   BP 183/75 mmHg   Ao pk vel 1.78 m/s   AV Area VTI 1.98 cm2   AR max vel 1.91 cm2   AV Mean grad 7.0 mmHg   AV Peak grad 12.7 mmHg   Single Plane A2C EF 36.5 %   Single Plane A4C EF 45.0 %   Calc EF 40.9 %   S' Lateral 4.30 cm   AV Area mean vel 1.82 cm2   Area-P 1/2 3.95 cm2   MV VTI 2.08 cm2   Est EF 35 - 40%   Glucose, capillary     Status: Abnormal   Collection Time: 03/25/23 11:54 AM  Result Value Ref Range   Glucose-Capillary 164 (H) 70 - 99 mg/dL    Comment: Glucose reference range applies only to samples taken after fasting for at least 8 hours.  NM Myocar Multi W/Spect W/Wall Motion / EF     Status: None   Collection Time: 04/17/23 12:21 PM  Result Value Ref Range   Rest HR 75.0 bpm   Rest BP 160/70 mmHg   Peak HR 75 bpm   Peak BP 160/70 mmHg   MPHR 157 bpm   Percent HR 47.0 %   Rest Nuclear Isotope Dose 10.9 mCi   Stress Nuclear Isotope Dose 31.4 mCi   SSS 8.0    SRS 12.0    SDS 1.0    TID 0.97    LV sys vol 116.0 mL   LV dias vol 179.0 62 - 150 mL   Nuc Stress EF 35 %    Radiology NM Myocar Multi W/Spect W/Wall Motion / EF  Result Date: 04/18/2023 Pharmacological myocardial perfusion imaging study with no significant  ischemia Fixed inferolateral defect consistent with prior MI Normal wall motion, EF estimated at 46% No EKG changes concerning for ischemia at peak stress or in recovery. CT attenuation correction images with bilateral pleural effusions, right greater than left, three-vessel coronary calcification, mild diffuse aortic atherosclerosis Low risk scan Signed, Dossie Arbour, MD, Ph.D [facility]    Assessment/Plan  ESRD on dialysis [facility]) Doing PD  PAD (peripheral artery disease) (HCC) Stable, no new  symptoms.  Recheck ABIs in 6 months.   Diabetes (HCC) blood glucose control important in reducing the progression of atherosclerotic disease. Also, involved in wound healing. On appropriate medications.     Essential hypertension blood pressure control important in reducing the progression of atherosclerotic disease. On appropriate oral medications   Lymphedema Even with his decreased mobility with his foot drop, his swelling is still under pretty good control.  He has been elevating and using compression well.  He is not using his lymphedema pump and would like to donate it back to the company  so that they could use it for an indigent patient which is very kind.  Festus Barren, MD  04/29/2023 10:56 AM    This note was created with Dragon medical transcription system.  Any errors from dictation are purely unintentional

## 2023-04-30 DIAGNOSIS — N250 Renal osteodystrophy: Secondary | ICD-10-CM | POA: Diagnosis not present

## 2023-04-30 DIAGNOSIS — Z992 Dependence on renal dialysis: Secondary | ICD-10-CM | POA: Diagnosis not present

## 2023-04-30 DIAGNOSIS — N186 End stage renal disease: Secondary | ICD-10-CM | POA: Diagnosis not present

## 2023-04-30 DIAGNOSIS — D649 Anemia, unspecified: Secondary | ICD-10-CM | POA: Diagnosis not present

## 2023-05-01 ENCOUNTER — Ambulatory Visit: Payer: Medicare Other | Admitting: Student

## 2023-05-01 DIAGNOSIS — N186 End stage renal disease: Secondary | ICD-10-CM | POA: Diagnosis not present

## 2023-05-01 DIAGNOSIS — Z992 Dependence on renal dialysis: Secondary | ICD-10-CM | POA: Diagnosis not present

## 2023-05-02 DIAGNOSIS — Z992 Dependence on renal dialysis: Secondary | ICD-10-CM | POA: Diagnosis not present

## 2023-05-02 DIAGNOSIS — N186 End stage renal disease: Secondary | ICD-10-CM | POA: Diagnosis not present

## 2023-05-03 DIAGNOSIS — N186 End stage renal disease: Secondary | ICD-10-CM | POA: Diagnosis not present

## 2023-05-03 DIAGNOSIS — Z992 Dependence on renal dialysis: Secondary | ICD-10-CM | POA: Diagnosis not present

## 2023-05-04 DIAGNOSIS — Z992 Dependence on renal dialysis: Secondary | ICD-10-CM | POA: Diagnosis not present

## 2023-05-04 DIAGNOSIS — N186 End stage renal disease: Secondary | ICD-10-CM | POA: Diagnosis not present

## 2023-05-05 DIAGNOSIS — N186 End stage renal disease: Secondary | ICD-10-CM | POA: Diagnosis not present

## 2023-05-05 DIAGNOSIS — Z992 Dependence on renal dialysis: Secondary | ICD-10-CM | POA: Diagnosis not present

## 2023-05-05 NOTE — Progress Notes (Unsigned)
Cardiology Office Note  Date:  05/05/2023   ID:  [name] [name], DOB 1961-01-24, MRN [id number]  PCP:  Sallyanne Kuster, NP   No chief complaint on file.   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), HTN,  HLD,  GERD Former smoker quit 2013 Sleep apnea, unable to tolerate mask Statin intolerance Chronic renal insuff secondary to poorly controlled diabetes Who presents for routine follow-up of his coronary artery disease, history of bypass  LOV December 2023 Seen by one of our providers November 2024 Reported having cold sweats, hypertension systolic pressure 200 Medications have been changed when he was in the hospital for hyperkalemia Is recommended he continue isosorbide, hydralazine, torsemide Losartan had been held for hyperkalemia Started on carvedilol  Stress test April 17, 2023 low risk study Bilateral pleural effusions noted right greater than left  On peritoneal diaylsis, doing well, tolerating dialysis every evening, wife helps  Reports dramatic improvement in his A1c, previously greater than 10 A1C most recently running 8.2, with lab work done through [facility],  has made dramatic changes to his diet   Has dexcom monitor on his arm which gives him instant feedback after eating certain things Having some sugar spikes  Labile blood pressure Sometimes low, 102 systolic is lowest, Highest measured was 180, average appears to be 1 30-1 50 systolic  On torsemide 20 BID, denies significant lower extremity edema No PND orthopnea  Echo 9/22 Left ventricular ejection fraction, by estimation, is 50 to 55%.  Lipid panel reviewed  on Praluent and Zetia down to 200 No recent labs since A1c has improved  Prior history of sleep apnea, CPAP and bipap did not work, Working with Dr. Welton Flakes  EKG personally reviewed by myself on todays visit Normal sinus rhythm  rate 67 bpm nonspecific ST abnormality V6, 1 and aVL  Other past medical history reviewed Covid: in the hospital 12/2020 hypoxia, cough, weakness, shortness of breath, COVID-positive, elevated troponin  Echo 04/2020 EF 30 to 35%  Medication intolerances On farxiga/jardiance gets "pancreatitis" Stopped hydralazine on his own, "intolerance"  Echocardiogram 08/2019  normal LV function, no evidence of valve disease or elevated right heart pressures  Outside notes from [place] [facility]  initial ejection fraction 35% presumably in 2018 Ejection fraction 50% in October 2019 Notes indicating ejection fraction 40 to 45% October 2020 Lexiscan Myoview 2019 showing small to moderate inferolateral infarction without ischemia  Notes indicate previously unable to increase Entresto secondary to high potassium level  CABG details from [place] LIMA to the LAD Vein graft to the RCA Vein graft OM1 Vein graft to D1  Statin myalgias, rheumatoid issue  PMH:   has a past medical history of Anemia, Arthritis, CAD (coronary artery disease), CKD (chronic kidney disease), stage IV (HCC), Colon polyps, COPD (chronic obstructive pulmonary disease) (HCC), Dupre's syndrome, GERD (gastroesophageal reflux disease), HFrEF (heart failure reduced ejection fraction) (HCC), History of 2019 novel coronavirus disease (COVID-19) (02/08/2021), Hyperlipidemia, Hypertension, Ischemic cardiomyopathy, Lymphedema, Migraine, Myocardial infarction (HCC), Neuropathy, PAD (peripheral artery disease) (HCC), Pneumonia, Retinopathy, S/P CABG x 4, Sleep apnea, Statin intolerance, Stomach ulcer, and T2DM (type 2 diabetes mellitus) (HCC).  PSH:    Past Surgical History:  Procedure Laterality Date   bone graft surgery Bilateral    on both feet x 2   CAPD INSERTION N/A 11/13/2021   Procedure: LAPAROSCOPIC INSERTION CONTINUOUS AMBULATORY PERITONEAL DIALYSIS  (CAPD) CATHETER;  Surgeon:  Leafy Ro, MD;  Location: [facility];   Service: General;  Laterality: N/A;   CAPD REVISION N/A 01/10/2022   Procedure: LAPAROSCOPIC REVISION CONTINUOUS AMBULATORY PERITONEAL DIALYSIS  (CAPD) CATHETER;  Surgeon: Leafy Ro, MD;  Location: [facility];  Service: General;  Laterality: N/A;   CARDIAC CATHETERIZATION  2013   S/p PCI    CARDIAC CATHETERIZATION  2018   S/p CABG   CATARACT EXTRACTION Bilateral    COLONOSCOPY     COLONOSCOPY WITH PROPOFOL N/A 10/17/2022   Procedure: COLONOSCOPY WITH PROPOFOL;  Surgeon: [name], MD;  Location: [facility];  Service: Endoscopy;  Laterality: N/A;   CORONARY ARTERY BYPASS GRAFT  2018   (LIMA-LAD,VG-RCA,VG-OM1,VG-D1)   ESOPHAGOGASTRODUODENOSCOPY (EGD) WITH PROPOFOL N/A 04/04/2020   Procedure: ESOPHAGOGASTRODUODENOSCOPY (EGD) WITH PROPOFOL;  Surgeon: [name], MD;  Location: [facility];  Service: Endoscopy;  Laterality: N/A;   ESOPHAGOGASTRODUODENOSCOPY (EGD) WITH PROPOFOL N/A 06/06/2020   Procedure: ESOPHAGOGASTRODUODENOSCOPY (EGD) WITH PROPOFOL;  Surgeon: [name], MD;  Location: [facility];  Service: Endoscopy;  Laterality: N/A;   ESOPHAGOGASTRODUODENOSCOPY (EGD) WITH PROPOFOL N/A 09/24/2022   Procedure: ESOPHAGOGASTRODUODENOSCOPY (EGD) WITH PROPOFOL;  Surgeon: [name], MD;  Location: [facility];  Service: Endoscopy;  Laterality: N/A;   PERIPHERAL VASCULAR CATHETERIZATION Right 10/28/2016   PTA/DEB Right SFA   WRIST SURGERY Left    cyst    Current Outpatient Medications  Medication Sig Dispense Refill   albuterol (VENTOLIN HFA) 108 (90 Base) MCG/ACT inhaler Inhale 1-2 puffs into the lungs every 6 (six) hours as needed for wheezing or shortness of breath. 1 each 3   amLODipine (NORVASC) 10 MG tablet Take 1 tablet (10 mg total) by mouth daily. 30 tablet 0   aspirin EC 81 MG tablet Take 81 mg by mouth daily. Swallow whole.     calcium acetate (PHOSLO) 667 MG capsule Take 1,334 mg by mouth 3 (three) times daily.     carvedilol (COREG) 3.125 MG tablet Take 1  tablet (3.125 mg total) by mouth 2 (two) times daily. 180 tablet 1   cholecalciferol (VITAMIN D3) 25 MCG (1000 UNIT) tablet Take 2,000 Units by mouth 2 (two) times daily.     docusate calcium (SURFAK) 240 MG capsule Take 240 mg by mouth daily.     Evolocumab (REPATHA SURECLICK) 140 MG/ML SOAJ INJECT 140 MG UNDER THE SKIN EVERY 14 DAYS 6 mL 3   ezetimibe (ZETIA) 10 MG tablet TAKE 1 TABLET(10 MG) BY MOUTH DAILY 90 tablet 2   hydrALAZINE (APRESOLINE) 100 MG tablet Take 1 tablet (100 mg total) by mouth 3 (three) times daily. 270 tablet 3   insulin lispro (HUMALOG) 100 UNIT/ML KwikPen Inject into the skin.     Insulin Pen Needle (B-D ULTRAFINE III SHORT PEN) 31G X 8 MM MISC To use with pen basal and mealtime coverage insulin pens 5 times daily. DX: E11.65 500 each 1   isosorbide mononitrate (IMDUR) 30 MG 24 hr tablet Take 1 tablet (30 mg total) by mouth 2 (two) times daily. 180 tablet 3   nortriptyline (PAMELOR) 10 MG capsule Take 50 mg by mouth at bedtime.     oxyCODONE-acetaminophen (PERCOCET) 7.5-325 MG tablet Take 1 tablet by mouth every 4 (four) hours as needed for severe pain (pain score 7-10) or moderate pain (pain score 4-6).     pantoprazole (PROTONIX) 40 MG tablet TAKE 1 TABLET(40 MG) BY MOUTH TWICE DAILY BEFORE A MEAL 180 tablet 3   torsemide (DEMADEX) 20 MG tablet Take 1 tablet (20 mg  total) by mouth 2 (two) times daily. 180 tablet 2   TOUJEO MAX SOLOSTAR 300 UNIT/ML Solostar Pen 60 Units daily.     No current facility-administered medications for this visit.    Allergies:   Iodinated contrast media, Other, Statins, Pregabalin, Sacubitril-valsartan, Atorvastatin, Pravastatin, and Rosuvastatin   Social History:  The patient  reports that he has quit smoking. His smoking use included cigarettes. He has a 9.8 pack-year smoking history. He has been exposed to tobacco smoke. He has never used smokeless tobacco. He reports that he does not currently use alcohol. He reports that he does not use  drugs.   Family History:   family history includes Diabetes in his mother; Heart disease in his father.    Review of Systems: Review of Systems  Constitutional: Negative.   HENT: Negative.    Respiratory: Negative.    Cardiovascular: Negative.   Gastrointestinal: Negative.   Musculoskeletal: Negative.        Leg weakness, sores on legs  Neurological: Negative.   Psychiatric/Behavioral: Negative.    All other systems reviewed and are negative.  PHYSICAL EXAM: VS:  There were no vitals taken for this visit. , BMI There is no height or weight on file to calculate BMI. Constitutional:  oriented to person, place, and time. No distress.  HENT:  Head: Grossly normal Eyes:  no discharge. No scleral icterus.  Neck: No JVD, no carotid bruits  Cardiovascular: Regular rate and rhythm, no murmurs appreciated Pulmonary/Chest: Clear to auscultation bilaterally, no wheezes or rails Abdominal: Soft.  no distension.  no tenderness.  Musculoskeletal: Normal range of motion Neurological:  normal muscle tone. Coordination normal. No atrophy Skin: Skin warm and dry Psychiatric: normal affect, pleasant  Recent Labs: 03/24/2023: ALT 17; Magnesium 2.0; TSH 4.006 03/25/2023: BUN 80; Creatinine, Ser 5.52; Hemoglobin 11.2; Platelets 232; Potassium 4.1; Sodium 134    Lipid Panel Lab Results  Component Value Date   CHOL 201 (H) 12/07/2020   HDL 37 (L) 12/07/2020   LDLCALC 100 (H) 12/07/2020   TRIG 381 (H) 12/07/2020    Wt Readings from Last 3 Encounters:  04/29/23 196 lb (88.9 kg)  04/10/23 196 lb 12.8 oz (89.3 kg)  03/26/23 189 lb 6 oz (85.9 kg)     ASSESSMENT AND PLAN:  Problem List Items Addressed This Visit   None  End-stage renal disease on dialysis Tolerating peritoneal dialysis Lab work done through nephrology  CAD with stable angina Ejection fraction 50% Continue Zetia with Praluent Repeat lipid panel ordered by primary care, goal LDL less than 70 Suspect there will be  some improvement with better diabetes control  Acute on chronic diastolic CHF In the setting of renal failure Plan to torsemide 20 twice daily, appears euvolemic  Essential hypertension Blood pressure is well controlled on today's visit. No changes made to the medications.  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 Thedore Mins [facility]  Hyperlipidemia Continue Zetia Praluent Praluent 150 every 2 weeks Goal total cholesterol less than 150   Total encounter time more than 30 minutes  Greater than 50% was spent in counseling and coordination of care with the patient  Signed, Dossie Arbour, M.D., Ph.D. [facility] [place], [place] [phone]

## 2023-05-06 ENCOUNTER — Ambulatory Visit: Payer: Medicare Other | Attending: Cardiovascular Disease | Admitting: Cardiovascular Disease

## 2023-05-06 ENCOUNTER — Encounter: Payer: Self-pay | Admitting: Cardiovascular Disease

## 2023-05-06 VITALS — BP 150/70 | HR 80 | Ht 74.0 in | Wt 193.4 lb

## 2023-05-06 DIAGNOSIS — I890 Lymphedema, not elsewhere classified: Secondary | ICD-10-CM

## 2023-05-06 DIAGNOSIS — I5032 Chronic diastolic (congestive) heart failure: Secondary | ICD-10-CM

## 2023-05-06 DIAGNOSIS — I700 Atherosclerosis of aorta: Secondary | ICD-10-CM

## 2023-05-06 DIAGNOSIS — N186 End stage renal disease: Secondary | ICD-10-CM

## 2023-05-06 DIAGNOSIS — I739 Peripheral vascular disease, unspecified: Secondary | ICD-10-CM

## 2023-05-06 DIAGNOSIS — Z951 Presence of aortocoronary bypass graft: Secondary | ICD-10-CM | POA: Diagnosis not present

## 2023-05-06 DIAGNOSIS — Z992 Dependence on renal dialysis: Secondary | ICD-10-CM | POA: Diagnosis not present

## 2023-05-06 DIAGNOSIS — I252 Old myocardial infarction: Secondary | ICD-10-CM

## 2023-05-06 DIAGNOSIS — I2510 Atherosclerotic heart disease of native coronary artery without angina pectoris: Secondary | ICD-10-CM | POA: Diagnosis not present

## 2023-05-06 DIAGNOSIS — I10 Essential (primary) hypertension: Secondary | ICD-10-CM

## 2023-05-06 DIAGNOSIS — N184 Chronic kidney disease, stage 4 (severe): Secondary | ICD-10-CM

## 2023-05-06 DIAGNOSIS — E1122 Type 2 diabetes mellitus with diabetic chronic kidney disease: Secondary | ICD-10-CM

## 2023-05-06 DIAGNOSIS — I5022 Chronic systolic (congestive) heart failure: Secondary | ICD-10-CM | POA: Diagnosis not present

## 2023-05-06 DIAGNOSIS — J90 Pleural effusion, not elsewhere classified: Secondary | ICD-10-CM

## 2023-05-06 MED ORDER — NITROGLYCERIN 0.4 MG SL SUBL: 0.4000 mg | SUBLINGUAL_TABLET | SUBLINGUAL | 3 refills | Status: DC | PRN

## 2023-05-06 MED ORDER — EZETIMIBE 10 MG PO TABS: 10.0000 mg | ORAL_TABLET | Freq: Every day | ORAL | 3 refills | Status: DC

## 2023-05-06 NOTE — Patient Instructions (Addendum)
Medication Instructions:  No changes  If you need a refill on your cardiac medications before your next appointment, please call your pharmacy.    Lab work: No new labs needed   Testing/Procedures: No new testing needed   Follow-Up: At [facility], you and your health needs are our priority.  As part of our continuing mission to provide you with exceptional heart care, we have created designated Provider Care Teams.  These Care Teams include your primary Cardiologist (physician) and Advanced Practice Providers (APPs -  Physician Assistants and Nurse Practitioners) who all work together to provide you with the care you need, when you need it.  You will need a follow up appointment in 6 months  Providers on your designated Care Team:   Christopher Berge, NP Ryan Dunn, PA-C Cadence Furth, PA-C  COVID-19 Vaccine Information can be found at: [email or web address] For questions related to vaccine distribution or appointments, please email [email or web address] or call [phone].   

## 2023-05-07 DIAGNOSIS — N186 End stage renal disease: Secondary | ICD-10-CM | POA: Diagnosis not present

## 2023-05-07 DIAGNOSIS — Z992 Dependence on renal dialysis: Secondary | ICD-10-CM | POA: Diagnosis not present

## 2023-05-08 DIAGNOSIS — Z992 Dependence on renal dialysis: Secondary | ICD-10-CM | POA: Diagnosis not present

## 2023-05-08 DIAGNOSIS — N186 End stage renal disease: Secondary | ICD-10-CM | POA: Diagnosis not present

## 2023-05-09 DIAGNOSIS — N186 End stage renal disease: Secondary | ICD-10-CM | POA: Diagnosis not present

## 2023-05-09 DIAGNOSIS — Z992 Dependence on renal dialysis: Secondary | ICD-10-CM | POA: Diagnosis not present

## 2023-05-10 DIAGNOSIS — N186 End stage renal disease: Secondary | ICD-10-CM | POA: Diagnosis not present

## 2023-05-10 DIAGNOSIS — Z992 Dependence on renal dialysis: Secondary | ICD-10-CM | POA: Diagnosis not present

## 2023-05-11 DIAGNOSIS — N186 End stage renal disease: Secondary | ICD-10-CM | POA: Diagnosis not present

## 2023-05-11 DIAGNOSIS — Z992 Dependence on renal dialysis: Secondary | ICD-10-CM | POA: Diagnosis not present

## 2023-05-12 ENCOUNTER — Ambulatory Visit: Payer: Medicare Other | Admitting: Podiatry

## 2023-05-12 ENCOUNTER — Encounter: Payer: Self-pay | Admitting: Podiatry

## 2023-05-12 DIAGNOSIS — Z794 Long term (current) use of insulin: Secondary | ICD-10-CM

## 2023-05-12 DIAGNOSIS — B351 Tinea unguium: Secondary | ICD-10-CM | POA: Diagnosis not present

## 2023-05-12 DIAGNOSIS — N186 End stage renal disease: Secondary | ICD-10-CM | POA: Diagnosis not present

## 2023-05-12 DIAGNOSIS — E1142 Type 2 diabetes mellitus with diabetic polyneuropathy: Secondary | ICD-10-CM | POA: Diagnosis not present

## 2023-05-12 DIAGNOSIS — E118 Type 2 diabetes mellitus with unspecified complications: Secondary | ICD-10-CM | POA: Diagnosis not present

## 2023-05-12 DIAGNOSIS — M79676 Pain in unspecified toe(s): Secondary | ICD-10-CM | POA: Diagnosis not present

## 2023-05-12 DIAGNOSIS — Z992 Dependence on renal dialysis: Secondary | ICD-10-CM | POA: Diagnosis not present

## 2023-05-12 NOTE — Progress Notes (Signed)
He presents today for his diabetic foot exam.  Objective: Vital signs are stable he is alert and oriented x 3 no pulses no neurologic sensorium.  Toenails are long thick yellow dystrophic mycotic debrided toenails that were necessary debridement.  Has a few abrasions that appear to be healing normally.  Assessment: Severe peripheral vascular disease peripheral neuropathy.  No complications currently.  Plan: Debridement of nails and calluses follow-up as needed

## 2023-05-13 DIAGNOSIS — N186 End stage renal disease: Secondary | ICD-10-CM | POA: Diagnosis not present

## 2023-05-13 DIAGNOSIS — Z992 Dependence on renal dialysis: Secondary | ICD-10-CM | POA: Diagnosis not present

## 2023-05-14 DIAGNOSIS — N186 End stage renal disease: Secondary | ICD-10-CM | POA: Diagnosis not present

## 2023-05-14 DIAGNOSIS — Z992 Dependence on renal dialysis: Secondary | ICD-10-CM | POA: Diagnosis not present

## 2023-05-15 DIAGNOSIS — Z992 Dependence on renal dialysis: Secondary | ICD-10-CM | POA: Diagnosis not present

## 2023-05-15 DIAGNOSIS — K080 Exfoliation of teeth due to systemic causes: Secondary | ICD-10-CM | POA: Diagnosis not present

## 2023-05-15 DIAGNOSIS — N186 End stage renal disease: Secondary | ICD-10-CM | POA: Diagnosis not present

## 2023-05-16 DIAGNOSIS — Z992 Dependence on renal dialysis: Secondary | ICD-10-CM | POA: Diagnosis not present

## 2023-05-16 DIAGNOSIS — N186 End stage renal disease: Secondary | ICD-10-CM | POA: Diagnosis not present

## 2023-05-17 DIAGNOSIS — N186 End stage renal disease: Secondary | ICD-10-CM | POA: Diagnosis not present

## 2023-05-17 DIAGNOSIS — Z992 Dependence on renal dialysis: Secondary | ICD-10-CM | POA: Diagnosis not present

## 2023-05-18 ENCOUNTER — Encounter: Payer: Self-pay | Admitting: Nurse Practitioner

## 2023-05-18 DIAGNOSIS — Z992 Dependence on renal dialysis: Secondary | ICD-10-CM | POA: Diagnosis not present

## 2023-05-18 DIAGNOSIS — N186 End stage renal disease: Secondary | ICD-10-CM | POA: Diagnosis not present

## 2023-05-19 DIAGNOSIS — Z992 Dependence on renal dialysis: Secondary | ICD-10-CM | POA: Diagnosis not present

## 2023-05-19 DIAGNOSIS — N186 End stage renal disease: Secondary | ICD-10-CM | POA: Diagnosis not present

## 2023-05-20 DIAGNOSIS — Z992 Dependence on renal dialysis: Secondary | ICD-10-CM | POA: Diagnosis not present

## 2023-05-20 DIAGNOSIS — N186 End stage renal disease: Secondary | ICD-10-CM | POA: Diagnosis not present

## 2023-05-21 DIAGNOSIS — N186 End stage renal disease: Secondary | ICD-10-CM | POA: Diagnosis not present

## 2023-05-21 DIAGNOSIS — Z992 Dependence on renal dialysis: Secondary | ICD-10-CM | POA: Diagnosis not present

## 2023-05-22 DIAGNOSIS — E1140 Type 2 diabetes mellitus with diabetic neuropathy, unspecified: Secondary | ICD-10-CM | POA: Diagnosis not present

## 2023-05-22 DIAGNOSIS — Z794 Long term (current) use of insulin: Secondary | ICD-10-CM | POA: Diagnosis not present

## 2023-05-22 DIAGNOSIS — N186 End stage renal disease: Secondary | ICD-10-CM | POA: Diagnosis not present

## 2023-05-22 DIAGNOSIS — Z992 Dependence on renal dialysis: Secondary | ICD-10-CM | POA: Diagnosis not present

## 2023-05-22 DIAGNOSIS — E785 Hyperlipidemia, unspecified: Secondary | ICD-10-CM | POA: Diagnosis not present

## 2023-05-22 DIAGNOSIS — E11319 Type 2 diabetes mellitus with unspecified diabetic retinopathy without macular edema: Secondary | ICD-10-CM | POA: Diagnosis not present

## 2023-05-22 DIAGNOSIS — E1122 Type 2 diabetes mellitus with diabetic chronic kidney disease: Secondary | ICD-10-CM | POA: Diagnosis not present

## 2023-05-22 DIAGNOSIS — E1169 Type 2 diabetes mellitus with other specified complication: Secondary | ICD-10-CM | POA: Diagnosis not present

## 2023-05-23 DIAGNOSIS — N186 End stage renal disease: Secondary | ICD-10-CM | POA: Diagnosis not present

## 2023-05-23 DIAGNOSIS — Z992 Dependence on renal dialysis: Secondary | ICD-10-CM | POA: Diagnosis not present

## 2023-05-24 DIAGNOSIS — N186 End stage renal disease: Secondary | ICD-10-CM | POA: Diagnosis not present

## 2023-05-24 DIAGNOSIS — Z992 Dependence on renal dialysis: Secondary | ICD-10-CM | POA: Diagnosis not present

## 2023-05-25 DIAGNOSIS — Z992 Dependence on renal dialysis: Secondary | ICD-10-CM | POA: Diagnosis not present

## 2023-05-25 DIAGNOSIS — N186 End stage renal disease: Secondary | ICD-10-CM | POA: Diagnosis not present

## 2023-05-26 DIAGNOSIS — N186 End stage renal disease: Secondary | ICD-10-CM | POA: Diagnosis not present

## 2023-05-26 DIAGNOSIS — Z992 Dependence on renal dialysis: Secondary | ICD-10-CM | POA: Diagnosis not present

## 2023-05-27 DIAGNOSIS — Z992 Dependence on renal dialysis: Secondary | ICD-10-CM | POA: Diagnosis not present

## 2023-05-27 DIAGNOSIS — N186 End stage renal disease: Secondary | ICD-10-CM | POA: Diagnosis not present

## 2023-05-28 DIAGNOSIS — Z992 Dependence on renal dialysis: Secondary | ICD-10-CM | POA: Diagnosis not present

## 2023-05-28 DIAGNOSIS — N186 End stage renal disease: Secondary | ICD-10-CM | POA: Diagnosis not present

## 2023-05-29 DIAGNOSIS — Z992 Dependence on renal dialysis: Secondary | ICD-10-CM | POA: Diagnosis not present

## 2023-05-29 DIAGNOSIS — N186 End stage renal disease: Secondary | ICD-10-CM | POA: Diagnosis not present

## 2023-05-30 DIAGNOSIS — N186 End stage renal disease: Secondary | ICD-10-CM | POA: Diagnosis not present

## 2023-05-30 DIAGNOSIS — E113513 Type 2 diabetes mellitus with proliferative diabetic retinopathy with macular edema, bilateral: Secondary | ICD-10-CM | POA: Diagnosis not present

## 2023-05-30 DIAGNOSIS — Z992 Dependence on renal dialysis: Secondary | ICD-10-CM | POA: Diagnosis not present

## 2023-05-31 DIAGNOSIS — N186 End stage renal disease: Secondary | ICD-10-CM | POA: Diagnosis not present

## 2023-05-31 DIAGNOSIS — Z992 Dependence on renal dialysis: Secondary | ICD-10-CM | POA: Diagnosis not present

## 2023-06-01 DIAGNOSIS — Z992 Dependence on renal dialysis: Secondary | ICD-10-CM | POA: Diagnosis not present

## 2023-06-01 DIAGNOSIS — N186 End stage renal disease: Secondary | ICD-10-CM | POA: Diagnosis not present

## 2023-06-02 DIAGNOSIS — N186 End stage renal disease: Secondary | ICD-10-CM | POA: Diagnosis not present

## 2023-06-02 DIAGNOSIS — Z992 Dependence on renal dialysis: Secondary | ICD-10-CM | POA: Diagnosis not present

## 2023-06-03 DIAGNOSIS — N186 End stage renal disease: Secondary | ICD-10-CM | POA: Diagnosis not present

## 2023-06-03 DIAGNOSIS — D631 Anemia in chronic kidney disease: Secondary | ICD-10-CM | POA: Diagnosis not present

## 2023-06-03 DIAGNOSIS — Z992 Dependence on renal dialysis: Secondary | ICD-10-CM | POA: Diagnosis not present

## 2023-06-03 DIAGNOSIS — E109 Type 1 diabetes mellitus without complications: Secondary | ICD-10-CM | POA: Diagnosis not present

## 2023-06-03 DIAGNOSIS — Z79899 Other long term (current) drug therapy: Secondary | ICD-10-CM | POA: Diagnosis not present

## 2023-06-03 DIAGNOSIS — D649 Anemia, unspecified: Secondary | ICD-10-CM | POA: Diagnosis not present

## 2023-06-03 DIAGNOSIS — E785 Hyperlipidemia, unspecified: Secondary | ICD-10-CM | POA: Diagnosis not present

## 2023-06-03 DIAGNOSIS — E559 Vitamin D deficiency, unspecified: Secondary | ICD-10-CM | POA: Diagnosis not present

## 2023-06-04 DIAGNOSIS — Z992 Dependence on renal dialysis: Secondary | ICD-10-CM | POA: Diagnosis not present

## 2023-06-04 DIAGNOSIS — N186 End stage renal disease: Secondary | ICD-10-CM | POA: Diagnosis not present

## 2023-06-05 DIAGNOSIS — N186 End stage renal disease: Secondary | ICD-10-CM | POA: Diagnosis not present

## 2023-06-05 DIAGNOSIS — Z992 Dependence on renal dialysis: Secondary | ICD-10-CM | POA: Diagnosis not present

## 2023-06-06 DIAGNOSIS — N186 End stage renal disease: Secondary | ICD-10-CM | POA: Diagnosis not present

## 2023-06-06 DIAGNOSIS — Z992 Dependence on renal dialysis: Secondary | ICD-10-CM | POA: Diagnosis not present

## 2023-06-07 DIAGNOSIS — N186 End stage renal disease: Secondary | ICD-10-CM | POA: Diagnosis not present

## 2023-06-07 DIAGNOSIS — Z992 Dependence on renal dialysis: Secondary | ICD-10-CM | POA: Diagnosis not present

## 2023-06-08 DIAGNOSIS — Z992 Dependence on renal dialysis: Secondary | ICD-10-CM | POA: Diagnosis not present

## 2023-06-08 DIAGNOSIS — N186 End stage renal disease: Secondary | ICD-10-CM | POA: Diagnosis not present

## 2023-06-09 DIAGNOSIS — Z992 Dependence on renal dialysis: Secondary | ICD-10-CM | POA: Diagnosis not present

## 2023-06-09 DIAGNOSIS — N186 End stage renal disease: Secondary | ICD-10-CM | POA: Diagnosis not present

## 2023-06-10 DIAGNOSIS — Z992 Dependence on renal dialysis: Secondary | ICD-10-CM | POA: Diagnosis not present

## 2023-06-10 DIAGNOSIS — N186 End stage renal disease: Secondary | ICD-10-CM | POA: Diagnosis not present

## 2023-06-11 DIAGNOSIS — Z992 Dependence on renal dialysis: Secondary | ICD-10-CM | POA: Diagnosis not present

## 2023-06-11 DIAGNOSIS — E118 Type 2 diabetes mellitus with unspecified complications: Secondary | ICD-10-CM | POA: Diagnosis not present

## 2023-06-11 DIAGNOSIS — Z794 Long term (current) use of insulin: Secondary | ICD-10-CM | POA: Diagnosis not present

## 2023-06-11 DIAGNOSIS — N186 End stage renal disease: Secondary | ICD-10-CM | POA: Diagnosis not present

## 2023-06-12 DIAGNOSIS — Z992 Dependence on renal dialysis: Secondary | ICD-10-CM | POA: Diagnosis not present

## 2023-06-12 DIAGNOSIS — N186 End stage renal disease: Secondary | ICD-10-CM | POA: Diagnosis not present

## 2023-06-13 DIAGNOSIS — N186 End stage renal disease: Secondary | ICD-10-CM | POA: Diagnosis not present

## 2023-06-13 DIAGNOSIS — Z992 Dependence on renal dialysis: Secondary | ICD-10-CM | POA: Diagnosis not present

## 2023-06-14 DIAGNOSIS — N186 End stage renal disease: Secondary | ICD-10-CM | POA: Diagnosis not present

## 2023-06-14 DIAGNOSIS — Z992 Dependence on renal dialysis: Secondary | ICD-10-CM | POA: Diagnosis not present

## 2023-06-15 DIAGNOSIS — N186 End stage renal disease: Secondary | ICD-10-CM | POA: Diagnosis not present

## 2023-06-15 DIAGNOSIS — Z992 Dependence on renal dialysis: Secondary | ICD-10-CM | POA: Diagnosis not present

## 2023-06-16 DIAGNOSIS — Z992 Dependence on renal dialysis: Secondary | ICD-10-CM | POA: Diagnosis not present

## 2023-06-16 DIAGNOSIS — N186 End stage renal disease: Secondary | ICD-10-CM | POA: Diagnosis not present

## 2023-06-17 DIAGNOSIS — Z992 Dependence on renal dialysis: Secondary | ICD-10-CM | POA: Diagnosis not present

## 2023-06-17 DIAGNOSIS — N186 End stage renal disease: Secondary | ICD-10-CM | POA: Diagnosis not present

## 2023-06-18 DIAGNOSIS — N186 End stage renal disease: Secondary | ICD-10-CM | POA: Diagnosis not present

## 2023-06-18 DIAGNOSIS — Z992 Dependence on renal dialysis: Secondary | ICD-10-CM | POA: Diagnosis not present

## 2023-06-19 DIAGNOSIS — N186 End stage renal disease: Secondary | ICD-10-CM | POA: Diagnosis not present

## 2023-06-19 DIAGNOSIS — Z992 Dependence on renal dialysis: Secondary | ICD-10-CM | POA: Diagnosis not present

## 2023-06-20 DIAGNOSIS — Z992 Dependence on renal dialysis: Secondary | ICD-10-CM | POA: Diagnosis not present

## 2023-06-20 DIAGNOSIS — N186 End stage renal disease: Secondary | ICD-10-CM | POA: Diagnosis not present

## 2023-06-21 DIAGNOSIS — N186 End stage renal disease: Secondary | ICD-10-CM | POA: Diagnosis not present

## 2023-06-21 DIAGNOSIS — Z992 Dependence on renal dialysis: Secondary | ICD-10-CM | POA: Diagnosis not present

## 2023-06-22 DIAGNOSIS — N186 End stage renal disease: Secondary | ICD-10-CM | POA: Diagnosis not present

## 2023-06-22 DIAGNOSIS — Z992 Dependence on renal dialysis: Secondary | ICD-10-CM | POA: Diagnosis not present

## 2023-06-23 ENCOUNTER — Encounter: Payer: Self-pay | Admitting: Cardiovascular Disease

## 2023-06-23 DIAGNOSIS — N186 End stage renal disease: Secondary | ICD-10-CM | POA: Diagnosis not present

## 2023-06-23 DIAGNOSIS — Z992 Dependence on renal dialysis: Secondary | ICD-10-CM | POA: Diagnosis not present

## 2023-06-24 ENCOUNTER — Other Ambulatory Visit: Payer: Self-pay | Admitting: Cardiovascular Disease

## 2023-06-24 DIAGNOSIS — N186 End stage renal disease: Secondary | ICD-10-CM | POA: Diagnosis not present

## 2023-06-24 DIAGNOSIS — Z992 Dependence on renal dialysis: Secondary | ICD-10-CM | POA: Diagnosis not present

## 2023-06-25 DIAGNOSIS — N186 End stage renal disease: Secondary | ICD-10-CM | POA: Diagnosis not present

## 2023-06-25 DIAGNOSIS — Z992 Dependence on renal dialysis: Secondary | ICD-10-CM | POA: Diagnosis not present

## 2023-06-26 DIAGNOSIS — Z1159 Encounter for screening for other viral diseases: Secondary | ICD-10-CM | POA: Diagnosis not present

## 2023-06-26 DIAGNOSIS — N186 End stage renal disease: Secondary | ICD-10-CM | POA: Diagnosis not present

## 2023-06-26 DIAGNOSIS — Z992 Dependence on renal dialysis: Secondary | ICD-10-CM | POA: Diagnosis not present

## 2023-06-27 DIAGNOSIS — Z992 Dependence on renal dialysis: Secondary | ICD-10-CM | POA: Diagnosis not present

## 2023-06-27 DIAGNOSIS — N186 End stage renal disease: Secondary | ICD-10-CM | POA: Diagnosis not present

## 2023-06-28 DIAGNOSIS — Z992 Dependence on renal dialysis: Secondary | ICD-10-CM | POA: Diagnosis not present

## 2023-06-28 DIAGNOSIS — N186 End stage renal disease: Secondary | ICD-10-CM | POA: Diagnosis not present

## 2023-06-29 DIAGNOSIS — N186 End stage renal disease: Secondary | ICD-10-CM | POA: Diagnosis not present

## 2023-06-29 DIAGNOSIS — Z992 Dependence on renal dialysis: Secondary | ICD-10-CM | POA: Diagnosis not present

## 2023-06-30 DIAGNOSIS — N186 End stage renal disease: Secondary | ICD-10-CM | POA: Diagnosis not present

## 2023-06-30 DIAGNOSIS — Z992 Dependence on renal dialysis: Secondary | ICD-10-CM | POA: Diagnosis not present

## 2023-07-01 DIAGNOSIS — N186 End stage renal disease: Secondary | ICD-10-CM | POA: Diagnosis not present

## 2023-07-01 DIAGNOSIS — Z992 Dependence on renal dialysis: Secondary | ICD-10-CM | POA: Diagnosis not present

## 2023-07-01 DIAGNOSIS — D649 Anemia, unspecified: Secondary | ICD-10-CM | POA: Diagnosis not present

## 2023-07-01 DIAGNOSIS — N250 Renal osteodystrophy: Secondary | ICD-10-CM | POA: Diagnosis not present

## 2023-07-02 DIAGNOSIS — Z992 Dependence on renal dialysis: Secondary | ICD-10-CM | POA: Diagnosis not present

## 2023-07-02 DIAGNOSIS — N186 End stage renal disease: Secondary | ICD-10-CM | POA: Diagnosis not present

## 2023-07-03 ENCOUNTER — Other Ambulatory Visit (HOSPITAL_COMMUNITY): Payer: Self-pay

## 2023-07-03 ENCOUNTER — Telehealth: Payer: Self-pay | Admitting: Cardiovascular Disease

## 2023-07-03 DIAGNOSIS — N186 End stage renal disease: Secondary | ICD-10-CM | POA: Diagnosis not present

## 2023-07-03 DIAGNOSIS — Z992 Dependence on renal dialysis: Secondary | ICD-10-CM | POA: Diagnosis not present

## 2023-07-03 MED ORDER — AMLODIPINE BESYLATE 10 MG PO TABS
10.0000 mg | ORAL_TABLET | Freq: Every day | ORAL | 3 refills | Status: DC
  Filled 2023-07-03: qty 90, 90d supply, fill #0

## 2023-07-03 NOTE — Telephone Encounter (Signed)
*  STAT* If patient is at the pharmacy, call can be transferred to refill team.   1. Which medications need to be refilled? (please list name of each medication and dose if known)  amLODipine  (NORVASC ) 10 MG tablet  2. Which pharmacy/location (including street and city if local pharmacy) is medication to be sent to? [id number] - [place], [place] - [address] AT [facility] OF [address]  3. Do they need a 30 day or 90 day supply?   90 day supply  Patient states he is almost out of medication.

## 2023-07-03 NOTE — Telephone Encounter (Signed)
 Refill sent as requested.

## 2023-07-04 ENCOUNTER — Telehealth: Payer: Self-pay | Admitting: Cardiovascular Disease

## 2023-07-04 ENCOUNTER — Other Ambulatory Visit (HOSPITAL_COMMUNITY): Payer: Self-pay

## 2023-07-04 DIAGNOSIS — Z992 Dependence on renal dialysis: Secondary | ICD-10-CM | POA: Diagnosis not present

## 2023-07-04 DIAGNOSIS — N186 End stage renal disease: Secondary | ICD-10-CM | POA: Diagnosis not present

## 2023-07-04 MED ORDER — AMLODIPINE BESYLATE 10 MG PO TABS: 10.0000 mg | ORAL_TABLET | Freq: Every day | ORAL | 3 refills | Status: DC

## 2023-07-04 NOTE — Telephone Encounter (Signed)
*  STAT* If patient is at the pharmacy, call can be transferred to refill team.   1. Which medications need to be refilled? (please list name of each medication and dose if known)   amLODipine  (NORVASC ) 10 MG tablet    2. Which pharmacy/location (including street and city if local pharmacy) is medication to be sent to? [id number] - [place], [place] - [address] AT [facility] OF [address]   3. Do they need a 30 day or 90 day supply? 90  He refill was sent to wrong pharmacy

## 2023-07-04 NOTE — Telephone Encounter (Signed)
Requested Prescriptions   Signed Prescriptions Disp Refills  . amLODipine (NORVASC) 10 MG tablet 90 tablet 3    Sig: Take 1 tablet (10 mg total) by mouth daily.    Authorizing Provider: GOLLAN, TIMOTHY J    Ordering User: Kaison Mcparland C    

## 2023-07-05 DIAGNOSIS — N186 End stage renal disease: Secondary | ICD-10-CM | POA: Diagnosis not present

## 2023-07-05 DIAGNOSIS — Z992 Dependence on renal dialysis: Secondary | ICD-10-CM | POA: Diagnosis not present

## 2023-07-06 DIAGNOSIS — N186 End stage renal disease: Secondary | ICD-10-CM | POA: Diagnosis not present

## 2023-07-06 DIAGNOSIS — Z992 Dependence on renal dialysis: Secondary | ICD-10-CM | POA: Diagnosis not present

## 2023-07-07 DIAGNOSIS — Z992 Dependence on renal dialysis: Secondary | ICD-10-CM | POA: Diagnosis not present

## 2023-07-07 DIAGNOSIS — N186 End stage renal disease: Secondary | ICD-10-CM | POA: Diagnosis not present

## 2023-07-08 DIAGNOSIS — Z992 Dependence on renal dialysis: Secondary | ICD-10-CM | POA: Diagnosis not present

## 2023-07-08 DIAGNOSIS — N186 End stage renal disease: Secondary | ICD-10-CM | POA: Diagnosis not present

## 2023-07-09 DIAGNOSIS — Z992 Dependence on renal dialysis: Secondary | ICD-10-CM | POA: Diagnosis not present

## 2023-07-09 DIAGNOSIS — N186 End stage renal disease: Secondary | ICD-10-CM | POA: Diagnosis not present

## 2023-07-10 DIAGNOSIS — N186 End stage renal disease: Secondary | ICD-10-CM | POA: Diagnosis not present

## 2023-07-10 DIAGNOSIS — Z992 Dependence on renal dialysis: Secondary | ICD-10-CM | POA: Diagnosis not present

## 2023-07-11 DIAGNOSIS — Z794 Long term (current) use of insulin: Secondary | ICD-10-CM | POA: Diagnosis not present

## 2023-07-11 DIAGNOSIS — Z992 Dependence on renal dialysis: Secondary | ICD-10-CM | POA: Diagnosis not present

## 2023-07-11 DIAGNOSIS — E118 Type 2 diabetes mellitus with unspecified complications: Secondary | ICD-10-CM | POA: Diagnosis not present

## 2023-07-11 DIAGNOSIS — N186 End stage renal disease: Secondary | ICD-10-CM | POA: Diagnosis not present

## 2023-07-12 DIAGNOSIS — N186 End stage renal disease: Secondary | ICD-10-CM | POA: Diagnosis not present

## 2023-07-12 DIAGNOSIS — Z992 Dependence on renal dialysis: Secondary | ICD-10-CM | POA: Diagnosis not present

## 2023-07-13 DIAGNOSIS — N186 End stage renal disease: Secondary | ICD-10-CM | POA: Diagnosis not present

## 2023-07-13 DIAGNOSIS — Z992 Dependence on renal dialysis: Secondary | ICD-10-CM | POA: Diagnosis not present

## 2023-07-14 ENCOUNTER — Other Ambulatory Visit (HOSPITAL_COMMUNITY): Payer: Self-pay

## 2023-07-14 DIAGNOSIS — N186 End stage renal disease: Secondary | ICD-10-CM | POA: Diagnosis not present

## 2023-07-14 DIAGNOSIS — Z992 Dependence on renal dialysis: Secondary | ICD-10-CM | POA: Diagnosis not present

## 2023-07-15 DIAGNOSIS — Z992 Dependence on renal dialysis: Secondary | ICD-10-CM | POA: Diagnosis not present

## 2023-07-15 DIAGNOSIS — N186 End stage renal disease: Secondary | ICD-10-CM | POA: Diagnosis not present

## 2023-07-16 DIAGNOSIS — Z992 Dependence on renal dialysis: Secondary | ICD-10-CM | POA: Diagnosis not present

## 2023-07-16 DIAGNOSIS — N186 End stage renal disease: Secondary | ICD-10-CM | POA: Diagnosis not present

## 2023-07-17 DIAGNOSIS — Z992 Dependence on renal dialysis: Secondary | ICD-10-CM | POA: Diagnosis not present

## 2023-07-17 DIAGNOSIS — N186 End stage renal disease: Secondary | ICD-10-CM | POA: Diagnosis not present

## 2023-07-18 DIAGNOSIS — N186 End stage renal disease: Secondary | ICD-10-CM | POA: Diagnosis not present

## 2023-07-18 DIAGNOSIS — Z992 Dependence on renal dialysis: Secondary | ICD-10-CM | POA: Diagnosis not present

## 2023-07-19 DIAGNOSIS — Z992 Dependence on renal dialysis: Secondary | ICD-10-CM | POA: Diagnosis not present

## 2023-07-19 DIAGNOSIS — N186 End stage renal disease: Secondary | ICD-10-CM | POA: Diagnosis not present

## 2023-07-20 DIAGNOSIS — Z992 Dependence on renal dialysis: Secondary | ICD-10-CM | POA: Diagnosis not present

## 2023-07-20 DIAGNOSIS — N186 End stage renal disease: Secondary | ICD-10-CM | POA: Diagnosis not present

## 2023-07-21 DIAGNOSIS — N186 End stage renal disease: Secondary | ICD-10-CM | POA: Diagnosis not present

## 2023-07-21 DIAGNOSIS — Z992 Dependence on renal dialysis: Secondary | ICD-10-CM | POA: Diagnosis not present

## 2023-07-22 DIAGNOSIS — Z992 Dependence on renal dialysis: Secondary | ICD-10-CM | POA: Diagnosis not present

## 2023-07-22 DIAGNOSIS — N186 End stage renal disease: Secondary | ICD-10-CM | POA: Diagnosis not present

## 2023-07-23 DIAGNOSIS — N186 End stage renal disease: Secondary | ICD-10-CM | POA: Diagnosis not present

## 2023-07-23 DIAGNOSIS — Z992 Dependence on renal dialysis: Secondary | ICD-10-CM | POA: Diagnosis not present

## 2023-07-24 DIAGNOSIS — Z992 Dependence on renal dialysis: Secondary | ICD-10-CM | POA: Diagnosis not present

## 2023-07-24 DIAGNOSIS — N186 End stage renal disease: Secondary | ICD-10-CM | POA: Diagnosis not present

## 2023-07-25 DIAGNOSIS — N186 End stage renal disease: Secondary | ICD-10-CM | POA: Diagnosis not present

## 2023-07-25 DIAGNOSIS — Z992 Dependence on renal dialysis: Secondary | ICD-10-CM | POA: Diagnosis not present

## 2023-07-26 DIAGNOSIS — N186 End stage renal disease: Secondary | ICD-10-CM | POA: Diagnosis not present

## 2023-07-26 DIAGNOSIS — Z992 Dependence on renal dialysis: Secondary | ICD-10-CM | POA: Diagnosis not present

## 2023-07-27 DIAGNOSIS — Z992 Dependence on renal dialysis: Secondary | ICD-10-CM | POA: Diagnosis not present

## 2023-07-27 DIAGNOSIS — N186 End stage renal disease: Secondary | ICD-10-CM | POA: Diagnosis not present

## 2023-07-28 DIAGNOSIS — Z992 Dependence on renal dialysis: Secondary | ICD-10-CM | POA: Diagnosis not present

## 2023-07-28 DIAGNOSIS — N186 End stage renal disease: Secondary | ICD-10-CM | POA: Diagnosis not present

## 2023-07-29 DIAGNOSIS — E113513 Type 2 diabetes mellitus with proliferative diabetic retinopathy with macular edema, bilateral: Secondary | ICD-10-CM | POA: Diagnosis not present

## 2023-07-29 DIAGNOSIS — Z961 Presence of intraocular lens: Secondary | ICD-10-CM | POA: Diagnosis not present

## 2023-07-29 DIAGNOSIS — H31093 Other chorioretinal scars, bilateral: Secondary | ICD-10-CM | POA: Diagnosis not present

## 2023-07-29 DIAGNOSIS — N186 End stage renal disease: Secondary | ICD-10-CM | POA: Diagnosis not present

## 2023-07-29 DIAGNOSIS — H43823 Vitreomacular adhesion, bilateral: Secondary | ICD-10-CM | POA: Diagnosis not present

## 2023-07-29 DIAGNOSIS — Z992 Dependence on renal dialysis: Secondary | ICD-10-CM | POA: Diagnosis not present

## 2023-07-29 DIAGNOSIS — H35033 Hypertensive retinopathy, bilateral: Secondary | ICD-10-CM | POA: Diagnosis not present

## 2023-07-30 DIAGNOSIS — N186 End stage renal disease: Secondary | ICD-10-CM | POA: Diagnosis not present

## 2023-07-30 DIAGNOSIS — D649 Anemia, unspecified: Secondary | ICD-10-CM | POA: Diagnosis not present

## 2023-07-30 DIAGNOSIS — Z992 Dependence on renal dialysis: Secondary | ICD-10-CM | POA: Diagnosis not present

## 2023-07-30 DIAGNOSIS — N250 Renal osteodystrophy: Secondary | ICD-10-CM | POA: Diagnosis not present

## 2023-07-31 DIAGNOSIS — Z992 Dependence on renal dialysis: Secondary | ICD-10-CM | POA: Diagnosis not present

## 2023-07-31 DIAGNOSIS — N186 End stage renal disease: Secondary | ICD-10-CM | POA: Diagnosis not present

## 2023-08-01 DIAGNOSIS — Z992 Dependence on renal dialysis: Secondary | ICD-10-CM | POA: Diagnosis not present

## 2023-08-01 DIAGNOSIS — N186 End stage renal disease: Secondary | ICD-10-CM | POA: Diagnosis not present

## 2023-08-02 DIAGNOSIS — N186 End stage renal disease: Secondary | ICD-10-CM | POA: Diagnosis not present

## 2023-08-02 DIAGNOSIS — Z992 Dependence on renal dialysis: Secondary | ICD-10-CM | POA: Diagnosis not present

## 2023-08-03 DIAGNOSIS — Z992 Dependence on renal dialysis: Secondary | ICD-10-CM | POA: Diagnosis not present

## 2023-08-03 DIAGNOSIS — N186 End stage renal disease: Secondary | ICD-10-CM | POA: Diagnosis not present

## 2023-08-04 DIAGNOSIS — N186 End stage renal disease: Secondary | ICD-10-CM | POA: Diagnosis not present

## 2023-08-04 DIAGNOSIS — Z992 Dependence on renal dialysis: Secondary | ICD-10-CM | POA: Diagnosis not present

## 2023-08-05 DIAGNOSIS — N186 End stage renal disease: Secondary | ICD-10-CM | POA: Diagnosis not present

## 2023-08-05 DIAGNOSIS — Z992 Dependence on renal dialysis: Secondary | ICD-10-CM | POA: Diagnosis not present

## 2023-08-06 DIAGNOSIS — Z992 Dependence on renal dialysis: Secondary | ICD-10-CM | POA: Diagnosis not present

## 2023-08-06 DIAGNOSIS — N186 End stage renal disease: Secondary | ICD-10-CM | POA: Diagnosis not present

## 2023-08-07 DIAGNOSIS — Z992 Dependence on renal dialysis: Secondary | ICD-10-CM | POA: Diagnosis not present

## 2023-08-07 DIAGNOSIS — N186 End stage renal disease: Secondary | ICD-10-CM | POA: Diagnosis not present

## 2023-08-08 DIAGNOSIS — N186 End stage renal disease: Secondary | ICD-10-CM | POA: Diagnosis not present

## 2023-08-08 DIAGNOSIS — Z992 Dependence on renal dialysis: Secondary | ICD-10-CM | POA: Diagnosis not present

## 2023-08-09 DIAGNOSIS — Z992 Dependence on renal dialysis: Secondary | ICD-10-CM | POA: Diagnosis not present

## 2023-08-09 DIAGNOSIS — N186 End stage renal disease: Secondary | ICD-10-CM | POA: Diagnosis not present

## 2023-08-10 DIAGNOSIS — Z992 Dependence on renal dialysis: Secondary | ICD-10-CM | POA: Diagnosis not present

## 2023-08-10 DIAGNOSIS — N186 End stage renal disease: Secondary | ICD-10-CM | POA: Diagnosis not present

## 2023-08-11 DIAGNOSIS — Z992 Dependence on renal dialysis: Secondary | ICD-10-CM | POA: Diagnosis not present

## 2023-08-11 DIAGNOSIS — N186 End stage renal disease: Secondary | ICD-10-CM | POA: Diagnosis not present

## 2023-08-12 DIAGNOSIS — N186 End stage renal disease: Secondary | ICD-10-CM | POA: Diagnosis not present

## 2023-08-12 DIAGNOSIS — Z992 Dependence on renal dialysis: Secondary | ICD-10-CM | POA: Diagnosis not present

## 2023-08-13 DIAGNOSIS — N186 End stage renal disease: Secondary | ICD-10-CM | POA: Diagnosis not present

## 2023-08-13 DIAGNOSIS — Z992 Dependence on renal dialysis: Secondary | ICD-10-CM | POA: Diagnosis not present

## 2023-08-14 DIAGNOSIS — N186 End stage renal disease: Secondary | ICD-10-CM | POA: Diagnosis not present

## 2023-08-14 DIAGNOSIS — Z992 Dependence on renal dialysis: Secondary | ICD-10-CM | POA: Diagnosis not present

## 2023-08-15 DIAGNOSIS — N186 End stage renal disease: Secondary | ICD-10-CM | POA: Diagnosis not present

## 2023-08-15 DIAGNOSIS — Z992 Dependence on renal dialysis: Secondary | ICD-10-CM | POA: Diagnosis not present

## 2023-08-16 ENCOUNTER — Other Ambulatory Visit: Payer: Self-pay | Admitting: Student

## 2023-08-16 DIAGNOSIS — Z992 Dependence on renal dialysis: Secondary | ICD-10-CM | POA: Diagnosis not present

## 2023-08-16 DIAGNOSIS — N186 End stage renal disease: Secondary | ICD-10-CM | POA: Diagnosis not present

## 2023-08-17 DIAGNOSIS — N186 End stage renal disease: Secondary | ICD-10-CM | POA: Diagnosis not present

## 2023-08-17 DIAGNOSIS — Z992 Dependence on renal dialysis: Secondary | ICD-10-CM | POA: Diagnosis not present

## 2023-08-18 DIAGNOSIS — N186 End stage renal disease: Secondary | ICD-10-CM | POA: Diagnosis not present

## 2023-08-18 DIAGNOSIS — Z992 Dependence on renal dialysis: Secondary | ICD-10-CM | POA: Diagnosis not present

## 2023-08-19 DIAGNOSIS — N186 End stage renal disease: Secondary | ICD-10-CM | POA: Diagnosis not present

## 2023-08-19 DIAGNOSIS — Z992 Dependence on renal dialysis: Secondary | ICD-10-CM | POA: Diagnosis not present

## 2023-08-20 DIAGNOSIS — N186 End stage renal disease: Secondary | ICD-10-CM | POA: Diagnosis not present

## 2023-08-20 DIAGNOSIS — Z992 Dependence on renal dialysis: Secondary | ICD-10-CM | POA: Diagnosis not present

## 2023-08-21 DIAGNOSIS — Z992 Dependence on renal dialysis: Secondary | ICD-10-CM | POA: Diagnosis not present

## 2023-08-21 DIAGNOSIS — N186 End stage renal disease: Secondary | ICD-10-CM | POA: Diagnosis not present

## 2023-08-22 DIAGNOSIS — N186 End stage renal disease: Secondary | ICD-10-CM | POA: Diagnosis not present

## 2023-08-22 DIAGNOSIS — Z992 Dependence on renal dialysis: Secondary | ICD-10-CM | POA: Diagnosis not present

## 2023-08-23 DIAGNOSIS — N186 End stage renal disease: Secondary | ICD-10-CM | POA: Diagnosis not present

## 2023-08-23 DIAGNOSIS — Z992 Dependence on renal dialysis: Secondary | ICD-10-CM | POA: Diagnosis not present

## 2023-08-24 DIAGNOSIS — Z992 Dependence on renal dialysis: Secondary | ICD-10-CM | POA: Diagnosis not present

## 2023-08-24 DIAGNOSIS — N186 End stage renal disease: Secondary | ICD-10-CM | POA: Diagnosis not present

## 2023-08-25 DIAGNOSIS — N186 End stage renal disease: Secondary | ICD-10-CM | POA: Diagnosis not present

## 2023-08-25 DIAGNOSIS — Z992 Dependence on renal dialysis: Secondary | ICD-10-CM | POA: Diagnosis not present

## 2023-08-26 DIAGNOSIS — N186 End stage renal disease: Secondary | ICD-10-CM | POA: Diagnosis not present

## 2023-08-26 DIAGNOSIS — Z794 Long term (current) use of insulin: Secondary | ICD-10-CM | POA: Diagnosis not present

## 2023-08-26 DIAGNOSIS — E118 Type 2 diabetes mellitus with unspecified complications: Secondary | ICD-10-CM | POA: Diagnosis not present

## 2023-08-26 DIAGNOSIS — Z992 Dependence on renal dialysis: Secondary | ICD-10-CM | POA: Diagnosis not present

## 2023-08-27 ENCOUNTER — Ambulatory Visit: Payer: Medicare Other | Admitting: Podiatry

## 2023-08-27 ENCOUNTER — Encounter: Payer: Self-pay | Admitting: Podiatry

## 2023-08-27 DIAGNOSIS — Z794 Long term (current) use of insulin: Secondary | ICD-10-CM | POA: Diagnosis not present

## 2023-08-27 DIAGNOSIS — M79676 Pain in unspecified toe(s): Secondary | ICD-10-CM

## 2023-08-27 DIAGNOSIS — E1142 Type 2 diabetes mellitus with diabetic polyneuropathy: Secondary | ICD-10-CM | POA: Diagnosis not present

## 2023-08-27 DIAGNOSIS — M21372 Foot drop, left foot: Secondary | ICD-10-CM

## 2023-08-27 DIAGNOSIS — B351 Tinea unguium: Secondary | ICD-10-CM

## 2023-08-27 DIAGNOSIS — Z992 Dependence on renal dialysis: Secondary | ICD-10-CM | POA: Diagnosis not present

## 2023-08-27 DIAGNOSIS — N186 End stage renal disease: Secondary | ICD-10-CM | POA: Diagnosis not present

## 2023-08-27 NOTE — Progress Notes (Signed)
 He presents today for follow-up of his neuropathy bilateral lower extremity.  States that he will soon be placed on a kidney pancreas list.  Continues at home peritoneal dialysis manual.  States that his numbness in his legs has not really worsened.  Denies any cuts or bruises on his feet or legs.  Objective: Vital signs are stable alert oriented x 3 pulses are barely palpable lower extremity are cool to the touch.  Nails are brittle.  No open lesions or wounds though his skin is dry and xerotic  Severe diabetic peripheral neuropathy to above the knees.  Protective sensation is lost per Semmes Weinstein monofilament tuning fork and pain to the level of the knee.  Assessment severe diabetic peripheral neuropathy nonambulatory.  Plan: Follow-up with me in 3 to 6 months.

## 2023-08-28 DIAGNOSIS — Z992 Dependence on renal dialysis: Secondary | ICD-10-CM | POA: Diagnosis not present

## 2023-08-28 DIAGNOSIS — N186 End stage renal disease: Secondary | ICD-10-CM | POA: Diagnosis not present

## 2023-08-29 DIAGNOSIS — N186 End stage renal disease: Secondary | ICD-10-CM | POA: Diagnosis not present

## 2023-08-29 DIAGNOSIS — Z992 Dependence on renal dialysis: Secondary | ICD-10-CM | POA: Diagnosis not present

## 2023-08-30 DIAGNOSIS — Z992 Dependence on renal dialysis: Secondary | ICD-10-CM | POA: Diagnosis not present

## 2023-08-30 DIAGNOSIS — N186 End stage renal disease: Secondary | ICD-10-CM | POA: Diagnosis not present

## 2023-08-31 DIAGNOSIS — Z992 Dependence on renal dialysis: Secondary | ICD-10-CM | POA: Diagnosis not present

## 2023-08-31 DIAGNOSIS — N186 End stage renal disease: Secondary | ICD-10-CM | POA: Diagnosis not present

## 2023-09-01 DIAGNOSIS — Z992 Dependence on renal dialysis: Secondary | ICD-10-CM | POA: Diagnosis not present

## 2023-09-01 DIAGNOSIS — N186 End stage renal disease: Secondary | ICD-10-CM | POA: Diagnosis not present

## 2023-09-02 DIAGNOSIS — Z992 Dependence on renal dialysis: Secondary | ICD-10-CM | POA: Diagnosis not present

## 2023-09-02 DIAGNOSIS — D649 Anemia, unspecified: Secondary | ICD-10-CM | POA: Diagnosis not present

## 2023-09-02 DIAGNOSIS — N250 Renal osteodystrophy: Secondary | ICD-10-CM | POA: Diagnosis not present

## 2023-09-02 DIAGNOSIS — N186 End stage renal disease: Secondary | ICD-10-CM | POA: Diagnosis not present

## 2023-09-02 DIAGNOSIS — E109 Type 1 diabetes mellitus without complications: Secondary | ICD-10-CM | POA: Diagnosis not present

## 2023-09-03 DIAGNOSIS — Z992 Dependence on renal dialysis: Secondary | ICD-10-CM | POA: Diagnosis not present

## 2023-09-03 DIAGNOSIS — N186 End stage renal disease: Secondary | ICD-10-CM | POA: Diagnosis not present

## 2023-09-04 DIAGNOSIS — N186 End stage renal disease: Secondary | ICD-10-CM | POA: Diagnosis not present

## 2023-09-04 DIAGNOSIS — Z992 Dependence on renal dialysis: Secondary | ICD-10-CM | POA: Diagnosis not present

## 2023-09-05 DIAGNOSIS — N186 End stage renal disease: Secondary | ICD-10-CM | POA: Diagnosis not present

## 2023-09-05 DIAGNOSIS — Z992 Dependence on renal dialysis: Secondary | ICD-10-CM | POA: Diagnosis not present

## 2023-09-06 DIAGNOSIS — N186 End stage renal disease: Secondary | ICD-10-CM | POA: Diagnosis not present

## 2023-09-06 DIAGNOSIS — Z992 Dependence on renal dialysis: Secondary | ICD-10-CM | POA: Diagnosis not present

## 2023-09-07 DIAGNOSIS — N186 End stage renal disease: Secondary | ICD-10-CM | POA: Diagnosis not present

## 2023-09-07 DIAGNOSIS — Z992 Dependence on renal dialysis: Secondary | ICD-10-CM | POA: Diagnosis not present

## 2023-09-08 DIAGNOSIS — Z992 Dependence on renal dialysis: Secondary | ICD-10-CM | POA: Diagnosis not present

## 2023-09-08 DIAGNOSIS — N186 End stage renal disease: Secondary | ICD-10-CM | POA: Diagnosis not present

## 2023-09-09 DIAGNOSIS — N186 End stage renal disease: Secondary | ICD-10-CM | POA: Diagnosis not present

## 2023-09-09 DIAGNOSIS — Z992 Dependence on renal dialysis: Secondary | ICD-10-CM | POA: Diagnosis not present

## 2023-09-09 DIAGNOSIS — E113513 Type 2 diabetes mellitus with proliferative diabetic retinopathy with macular edema, bilateral: Secondary | ICD-10-CM | POA: Diagnosis not present

## 2023-09-10 DIAGNOSIS — Z992 Dependence on renal dialysis: Secondary | ICD-10-CM | POA: Diagnosis not present

## 2023-09-10 DIAGNOSIS — N186 End stage renal disease: Secondary | ICD-10-CM | POA: Diagnosis not present

## 2023-09-11 DIAGNOSIS — N186 End stage renal disease: Secondary | ICD-10-CM | POA: Diagnosis not present

## 2023-09-11 DIAGNOSIS — Z992 Dependence on renal dialysis: Secondary | ICD-10-CM | POA: Diagnosis not present

## 2023-09-12 DIAGNOSIS — N186 End stage renal disease: Secondary | ICD-10-CM | POA: Diagnosis not present

## 2023-09-12 DIAGNOSIS — Z992 Dependence on renal dialysis: Secondary | ICD-10-CM | POA: Diagnosis not present

## 2023-09-13 DIAGNOSIS — Z992 Dependence on renal dialysis: Secondary | ICD-10-CM | POA: Diagnosis not present

## 2023-09-13 DIAGNOSIS — N186 End stage renal disease: Secondary | ICD-10-CM | POA: Diagnosis not present

## 2023-09-14 DIAGNOSIS — Z992 Dependence on renal dialysis: Secondary | ICD-10-CM | POA: Diagnosis not present

## 2023-09-14 DIAGNOSIS — N186 End stage renal disease: Secondary | ICD-10-CM | POA: Diagnosis not present

## 2023-09-15 DIAGNOSIS — Z992 Dependence on renal dialysis: Secondary | ICD-10-CM | POA: Diagnosis not present

## 2023-09-15 DIAGNOSIS — N186 End stage renal disease: Secondary | ICD-10-CM | POA: Diagnosis not present

## 2023-09-16 DIAGNOSIS — Z992 Dependence on renal dialysis: Secondary | ICD-10-CM | POA: Diagnosis not present

## 2023-09-16 DIAGNOSIS — N186 End stage renal disease: Secondary | ICD-10-CM | POA: Diagnosis not present

## 2023-09-17 ENCOUNTER — Ambulatory Visit: Admitting: Nurse Practitioner

## 2023-09-17 DIAGNOSIS — N186 End stage renal disease: Secondary | ICD-10-CM | POA: Diagnosis not present

## 2023-09-17 DIAGNOSIS — Z992 Dependence on renal dialysis: Secondary | ICD-10-CM | POA: Diagnosis not present

## 2023-09-18 DIAGNOSIS — N186 End stage renal disease: Secondary | ICD-10-CM | POA: Diagnosis not present

## 2023-09-18 DIAGNOSIS — Z992 Dependence on renal dialysis: Secondary | ICD-10-CM | POA: Diagnosis not present

## 2023-09-19 ENCOUNTER — Ambulatory Visit (INDEPENDENT_AMBULATORY_CARE_PROVIDER_SITE_OTHER): Admitting: Nurse Practitioner

## 2023-09-19 ENCOUNTER — Encounter: Payer: Self-pay | Admitting: Nurse Practitioner

## 2023-09-19 VITALS — BP 136/68 | HR 74 | Temp 98.6°F | Resp 16 | Ht 74.0 in | Wt 197.0 lb

## 2023-09-19 DIAGNOSIS — L089 Local infection of the skin and subcutaneous tissue, unspecified: Secondary | ICD-10-CM

## 2023-09-19 DIAGNOSIS — N186 End stage renal disease: Secondary | ICD-10-CM | POA: Diagnosis not present

## 2023-09-19 DIAGNOSIS — Z992 Dependence on renal dialysis: Secondary | ICD-10-CM | POA: Diagnosis not present

## 2023-09-19 MED ORDER — MUPIROCIN 2 % EX OINT: 1.0000 | TOPICAL_OINTMENT | Freq: Every day | CUTANEOUS | 1 refills | Status: DC

## 2023-09-19 NOTE — Progress Notes (Signed)
 [facility] [address] [place], [place] [address]  Internal MEDICINE  Office Visit Note  Patient Name: [name] [name]  [id number]  [id number]  Date of Service: 09/19/2023  Chief Complaint  Patient presents with   Acute Visit    Sore on left arm.      HPI Clebert presents for an acute sick visit for abrasion/wound on left arm --scabbed over spot on left forearm --trouble with temperature -- feels cold with chills then burning up.  And iron and hemoglobin is ok.      Current Medication:  Outpatient Encounter Medications as of 09/19/2023  Medication Sig Note   albuterol  (VENTOLIN  HFA) 108 (90 Base) MCG/ACT inhaler Inhale 1-2 puffs into the lungs every 6 (six) hours as needed for wheezing or shortness of breath.    aspirin  EC 81 MG tablet Take 81 mg by mouth daily. Swallow whole.    calcium  acetate (PHOSLO) 667 MG capsule Take 1,334 mg by mouth 3 (three) times daily. 03/24/2023: Has not started yet   carvedilol  (COREG ) 3.125 MG tablet TAKE 1 TABLET(3.125 MG) BY MOUTH TWICE DAILY    cholecalciferol  (VITAMIN D3) 25 MCG (1000 UNIT) tablet Take 2,000 Units by mouth 2 (two) times daily.    cloNIDine (CATAPRES) 0.1 MG tablet Take 1 tablet (0.1 mg total) by mouth 2 (two) times daily as needed (pressure >175).    docusate calcium  (SURFAK) 240 MG capsule Take 240 mg by mouth daily.    Evolocumab  (REPATHA  SURECLICK) 140 MG/ML SOAJ INJECT 140 MG UNDER THE SKIN EVERY 14 DAYS    ezetimibe  (ZETIA ) 10 MG tablet Take 1 tablet (10 mg total) by mouth daily.    hydrALAZINE  (APRESOLINE ) 100 MG tablet Take 1 tablet (100 mg total) by mouth 3 (three) times daily.    insulin  lispro (HUMALOG ) 100 UNIT/ML KwikPen Inject into the skin.    Insulin  Pen Needle (B-D ULTRAFINE III SHORT PEN) 31G X 8 MM MISC To use with pen basal and mealtime coverage insulin  pens 5 times daily. DX: E11.65    nitroGLYCERIN  (NITROSTAT ) 0.4 MG SL tablet Place 1 tablet (0.4 mg total) under the tongue every 5 (five)  minutes as needed for chest pain.    nortriptyline  (PAMELOR ) 10 MG capsule Take 50 mg by mouth at bedtime.    oxyCODONE -acetaminophen  (PERCOCET) 7.5-325 MG tablet Take 1 tablet by mouth every 4 (four) hours as needed for severe pain (pain score 7-10) or moderate pain (pain score 4-6).    pantoprazole  (PROTONIX ) 40 MG tablet TAKE 1 TABLET(40 MG) BY MOUTH TWICE DAILY BEFORE A MEAL    torsemide  (DEMADEX ) 20 MG tablet TAKE 1 TABLET(20 MG) BY MOUTH TWICE DAILY    TOUJEO  MAX SOLOSTAR 300 UNIT/ML Solostar Pen 60 Units daily.    amLODipine  (NORVASC ) 10 MG tablet Take 1 tablet (10 mg total) by mouth daily.    isosorbide  mononitrate (IMDUR ) 30 MG 24 hr tablet Take 1 tablet (30 mg total) by mouth 2 (two) times daily.    mupirocin ointment (BACTROBAN) 2 % Apply 1 Application topically daily. To wound on left forearm until healed.    No facility-administered encounter medications on file as of 09/19/2023.      Medical History: Past Medical History:  Diagnosis Date   Anemia    Arthritis    CAD (coronary artery disease)    a. 12/2016 s/p CABG x 4 (LIMA->LAD, VG->RCA, VG->OM1, VG->D1); b. 02/2018 MV: EF 35%, small-med inferolaterlal infarct. No ischemia.   CKD (chronic kidney disease), stage  IV [facility])    Peritoneal Dialysis   Colon polyps    COPD (chronic obstructive pulmonary disease) (HCC)    Dupre's syndrome    GERD (gastroesophageal reflux disease)    HFrEF (heart failure reduced ejection fraction) (HCC)    a.) 2018 EF 35%; b.) 02/2018 EF 50%; c). 02/2019 EF 40-45%; d.) 08/2019 Echo: EF 50-55%, no rwma, GrII DD; e.) 04/2020 Echo: EF 30-35%, mod-sev glob HK. Mild LVH. G2DD. Low-nl RV fxn. Mildly dil LA. Mild MR. Mild-mod Ao sclerosis w/o stenosis; f.) TTE 02/09/2021: EF 55%; septal HK, LAE; G2DD.   History of 2019 novel coronavirus disease (COVID-19) 02/08/2021   Hyperlipidemia    Hypertension    Ischemic cardiomyopathy    a.) 02/2018 MV: EF 35%; b.) 08/2019 Echo: EF 50-55%; c.) 04/2020 Echo: EF  30-35%; d.) TTE 02/09/2021: EF 50-55%.   Lymphedema    Migraine    Myocardial infarction [facility])    Neuropathy    PAD (peripheral artery disease) (HCC)    a. 04/2017 Aortoiliac duplex: R iliac dzs; b. 03/2020 LE Duplex: mild bilat atherosclerosis throughout. Patent vessels.   Pneumonia    Retinopathy    S/P CABG x 4    a.)  4v CABG: LIMA->LAD, SVG->RCA, SVG->OM1, SVG->D1   Sleep apnea    a.) does not require nocturnal PAP therapy   Statin intolerance    Stomach ulcer    T2DM (type 2 diabetes mellitus) (HCC)      Vital Signs: BP 136/68   Pulse 74   Temp 98.6 F (37 C)   Resp 16   Ht 6\' 2"  (1.88 m)   Wt 197 lb (89.4 kg)   SpO2 94%   BMI 25.29 kg/m    Review of Systems  Constitutional:  Negative for chills, fatigue and unexpected weight change.  HENT:  Negative for congestion, rhinorrhea, sneezing and sore throat.   Eyes:  Negative for redness.  Respiratory:  Negative for cough, chest tightness, shortness of breath and wheezing.   Cardiovascular:  Negative for chest pain and palpitations.  Gastrointestinal:  Negative for abdominal pain, constipation, diarrhea, nausea and vomiting.  Genitourinary:  Negative for dysuria and frequency.  Musculoskeletal:  Positive for arthralgias and gait problem. Negative for back pain, joint swelling and neck pain.  Skin:  Positive for wound (scabbed area on left forearm.). Negative for rash.  Neurological:  Positive for weakness. Negative for tremors and numbness.  Hematological:  Negative for adenopathy. Does not bruise/bleed easily.  Psychiatric/Behavioral:  Negative for behavioral problems (Depression), sleep disturbance and suicidal ideas. The patient is not nervous/anxious.     Physical Exam Vitals reviewed.  Constitutional:      General: He is not in acute distress.    Appearance: Normal appearance. He is not ill-appearing.  HENT:     Head: Normocephalic and atraumatic.  Eyes:     Pupils: Pupils are equal, round, and reactive to  light.  Cardiovascular:     Rate and Rhythm: Normal rate and regular rhythm.  Pulmonary:     Effort: Pulmonary effort is normal. No respiratory distress.  Neurological:     Mental Status: He is alert and oriented to person, place, and time.  Psychiatric:        Mood and Affect: Mood normal.        Behavior: Behavior normal.       Assessment/Plan: 1. Infected abrasion of left forearm, initial encounter (Primary) Mupirocin ointment prescribed.  - mupirocin ointment (BACTROBAN) 2 %; Apply 1 Application  topically daily. To wound on left forearm until healed.  Dispense: 30 g; Refill: 1   General Counseling: Mickael verbalizes understanding of the findings of todays visit and agrees with plan of treatment. I have discussed any further diagnostic evaluation that may be needed or ordered today. We also reviewed his medications today. he has been encouraged to call the office with any questions or concerns that should arise related to todays visit.    Counseling:    No orders of the defined types were placed in this encounter.   Meds ordered this encounter  Medications   mupirocin ointment (BACTROBAN) 2 %    Sig: Apply 1 Application topically daily. To wound on left forearm until healed.    Dispense:  30 g    Refill:  1    Return if symptoms worsen or fail to improve.  [place] Controlled Substance Database was reviewed by me for overdose risk score (ORS)  Time spent:20 Minutes Time spent with patient included reviewing progress notes, labs, imaging studies, and discussing plan for follow up.   This patient was seen by Laurence Pons, FNP-C in collaboration with Dr. Verneta Gone as a part of collaborative care agreement.  Nkosi Cortright R. Bobbi Burow, MSN, FNP-C Internal Medicine

## 2023-09-20 DIAGNOSIS — N186 End stage renal disease: Secondary | ICD-10-CM | POA: Diagnosis not present

## 2023-09-20 DIAGNOSIS — Z992 Dependence on renal dialysis: Secondary | ICD-10-CM | POA: Diagnosis not present

## 2023-09-21 DIAGNOSIS — Z992 Dependence on renal dialysis: Secondary | ICD-10-CM | POA: Diagnosis not present

## 2023-09-21 DIAGNOSIS — N186 End stage renal disease: Secondary | ICD-10-CM | POA: Diagnosis not present

## 2023-09-22 DIAGNOSIS — Z992 Dependence on renal dialysis: Secondary | ICD-10-CM | POA: Diagnosis not present

## 2023-09-22 DIAGNOSIS — N186 End stage renal disease: Secondary | ICD-10-CM | POA: Diagnosis not present

## 2023-09-23 DIAGNOSIS — N186 End stage renal disease: Secondary | ICD-10-CM | POA: Diagnosis not present

## 2023-09-23 DIAGNOSIS — Z992 Dependence on renal dialysis: Secondary | ICD-10-CM | POA: Diagnosis not present

## 2023-09-24 DIAGNOSIS — N186 End stage renal disease: Secondary | ICD-10-CM | POA: Diagnosis not present

## 2023-09-24 DIAGNOSIS — Z992 Dependence on renal dialysis: Secondary | ICD-10-CM | POA: Diagnosis not present

## 2023-09-25 DIAGNOSIS — Z992 Dependence on renal dialysis: Secondary | ICD-10-CM | POA: Diagnosis not present

## 2023-09-25 DIAGNOSIS — N186 End stage renal disease: Secondary | ICD-10-CM | POA: Diagnosis not present

## 2023-09-25 DIAGNOSIS — E118 Type 2 diabetes mellitus with unspecified complications: Secondary | ICD-10-CM | POA: Diagnosis not present

## 2023-09-25 DIAGNOSIS — Z794 Long term (current) use of insulin: Secondary | ICD-10-CM | POA: Diagnosis not present

## 2023-09-26 DIAGNOSIS — N186 End stage renal disease: Secondary | ICD-10-CM | POA: Diagnosis not present

## 2023-09-26 DIAGNOSIS — Z992 Dependence on renal dialysis: Secondary | ICD-10-CM | POA: Diagnosis not present

## 2023-09-27 DIAGNOSIS — Z992 Dependence on renal dialysis: Secondary | ICD-10-CM | POA: Diagnosis not present

## 2023-09-27 DIAGNOSIS — N186 End stage renal disease: Secondary | ICD-10-CM | POA: Diagnosis not present

## 2023-09-28 DIAGNOSIS — Z992 Dependence on renal dialysis: Secondary | ICD-10-CM | POA: Diagnosis not present

## 2023-09-28 DIAGNOSIS — N186 End stage renal disease: Secondary | ICD-10-CM | POA: Diagnosis not present

## 2023-09-29 DIAGNOSIS — N186 End stage renal disease: Secondary | ICD-10-CM | POA: Diagnosis not present

## 2023-09-29 DIAGNOSIS — Z992 Dependence on renal dialysis: Secondary | ICD-10-CM | POA: Diagnosis not present

## 2023-09-30 DIAGNOSIS — Z992 Dependence on renal dialysis: Secondary | ICD-10-CM | POA: Diagnosis not present

## 2023-09-30 DIAGNOSIS — N186 End stage renal disease: Secondary | ICD-10-CM | POA: Diagnosis not present

## 2023-09-30 DIAGNOSIS — D649 Anemia, unspecified: Secondary | ICD-10-CM | POA: Diagnosis not present

## 2023-10-01 ENCOUNTER — Telehealth: Payer: Self-pay | Admitting: Cardiovascular Disease

## 2023-10-01 ENCOUNTER — Other Ambulatory Visit: Payer: Self-pay | Admitting: Emergency Medicine

## 2023-10-01 DIAGNOSIS — N186 End stage renal disease: Secondary | ICD-10-CM | POA: Diagnosis not present

## 2023-10-01 DIAGNOSIS — Z992 Dependence on renal dialysis: Secondary | ICD-10-CM | POA: Diagnosis not present

## 2023-10-01 MED ORDER — REPATHA SURECLICK 140 MG/ML ~~LOC~~ SOAJ: SUBCUTANEOUS | 3 refills | Status: DC

## 2023-10-01 NOTE — Telephone Encounter (Signed)
 Pt c/o medication issue:  1. Name of Medication:   Evolocumab  (REPATHA  SURECLICK) 140 MG/ML SOAJ    2. How are you currently taking this medication (dosage and times per day)?  INJECT 140 MG UNDER THE SKIN EVERY 14 DAYS      3. Are you having a reaction (difficulty breathing--STAT)? No  4. What is your medication issue? Pt is requesting a callback regarding his refill request being denied when he stated he has 2 more. Please advise

## 2023-10-01 NOTE — Telephone Encounter (Signed)
 Evolocumab  (REPATHA  SURECLICK) 140 MG/ML SOAJ 6 mL 3 10/01/18  Sig: INJECT 140 MG UNDER THE SKIN EVERY 14 DAYS  Sent to pharmacy as: Evolocumab  (REPATHA  SURECLICK) 140 MG/ML Solution Auto-injector  E-Prescribing Status: Receipt confirmed by pharmacy (10/01/2023  4:09 PM EDT)

## 2023-10-02 DIAGNOSIS — Z992 Dependence on renal dialysis: Secondary | ICD-10-CM | POA: Diagnosis not present

## 2023-10-02 DIAGNOSIS — N186 End stage renal disease: Secondary | ICD-10-CM | POA: Diagnosis not present

## 2023-10-03 DIAGNOSIS — N186 End stage renal disease: Secondary | ICD-10-CM | POA: Diagnosis not present

## 2023-10-03 DIAGNOSIS — Z992 Dependence on renal dialysis: Secondary | ICD-10-CM | POA: Diagnosis not present

## 2023-10-04 DIAGNOSIS — Z992 Dependence on renal dialysis: Secondary | ICD-10-CM | POA: Diagnosis not present

## 2023-10-04 DIAGNOSIS — N186 End stage renal disease: Secondary | ICD-10-CM | POA: Diagnosis not present

## 2023-10-05 DIAGNOSIS — N186 End stage renal disease: Secondary | ICD-10-CM | POA: Diagnosis not present

## 2023-10-05 DIAGNOSIS — Z992 Dependence on renal dialysis: Secondary | ICD-10-CM | POA: Diagnosis not present

## 2023-10-06 DIAGNOSIS — N186 End stage renal disease: Secondary | ICD-10-CM | POA: Diagnosis not present

## 2023-10-06 DIAGNOSIS — Z992 Dependence on renal dialysis: Secondary | ICD-10-CM | POA: Diagnosis not present

## 2023-10-07 DIAGNOSIS — N186 End stage renal disease: Secondary | ICD-10-CM | POA: Diagnosis not present

## 2023-10-07 DIAGNOSIS — Z992 Dependence on renal dialysis: Secondary | ICD-10-CM | POA: Diagnosis not present

## 2023-10-08 DIAGNOSIS — N186 End stage renal disease: Secondary | ICD-10-CM | POA: Diagnosis not present

## 2023-10-08 DIAGNOSIS — Z992 Dependence on renal dialysis: Secondary | ICD-10-CM | POA: Diagnosis not present

## 2023-10-09 DIAGNOSIS — Z992 Dependence on renal dialysis: Secondary | ICD-10-CM | POA: Diagnosis not present

## 2023-10-09 DIAGNOSIS — N186 End stage renal disease: Secondary | ICD-10-CM | POA: Diagnosis not present

## 2023-10-10 DIAGNOSIS — Z992 Dependence on renal dialysis: Secondary | ICD-10-CM | POA: Diagnosis not present

## 2023-10-10 DIAGNOSIS — N186 End stage renal disease: Secondary | ICD-10-CM | POA: Diagnosis not present

## 2023-10-11 DIAGNOSIS — Z992 Dependence on renal dialysis: Secondary | ICD-10-CM | POA: Diagnosis not present

## 2023-10-11 DIAGNOSIS — N186 End stage renal disease: Secondary | ICD-10-CM | POA: Diagnosis not present

## 2023-10-12 DIAGNOSIS — Z992 Dependence on renal dialysis: Secondary | ICD-10-CM | POA: Diagnosis not present

## 2023-10-12 DIAGNOSIS — N186 End stage renal disease: Secondary | ICD-10-CM | POA: Diagnosis not present

## 2023-10-13 DIAGNOSIS — N186 End stage renal disease: Secondary | ICD-10-CM | POA: Diagnosis not present

## 2023-10-13 DIAGNOSIS — Z992 Dependence on renal dialysis: Secondary | ICD-10-CM | POA: Diagnosis not present

## 2023-10-14 ENCOUNTER — Encounter (INDEPENDENT_AMBULATORY_CARE_PROVIDER_SITE_OTHER): Payer: Self-pay

## 2023-10-14 DIAGNOSIS — Z992 Dependence on renal dialysis: Secondary | ICD-10-CM | POA: Diagnosis not present

## 2023-10-14 DIAGNOSIS — N186 End stage renal disease: Secondary | ICD-10-CM | POA: Diagnosis not present

## 2023-10-15 DIAGNOSIS — Z992 Dependence on renal dialysis: Secondary | ICD-10-CM | POA: Diagnosis not present

## 2023-10-15 DIAGNOSIS — N186 End stage renal disease: Secondary | ICD-10-CM | POA: Diagnosis not present

## 2023-10-16 DIAGNOSIS — N186 End stage renal disease: Secondary | ICD-10-CM | POA: Diagnosis not present

## 2023-10-16 DIAGNOSIS — Z992 Dependence on renal dialysis: Secondary | ICD-10-CM | POA: Diagnosis not present

## 2023-10-17 DIAGNOSIS — Z992 Dependence on renal dialysis: Secondary | ICD-10-CM | POA: Diagnosis not present

## 2023-10-17 DIAGNOSIS — N186 End stage renal disease: Secondary | ICD-10-CM | POA: Diagnosis not present

## 2023-10-18 DIAGNOSIS — Z992 Dependence on renal dialysis: Secondary | ICD-10-CM | POA: Diagnosis not present

## 2023-10-18 DIAGNOSIS — N186 End stage renal disease: Secondary | ICD-10-CM | POA: Diagnosis not present

## 2023-10-19 DIAGNOSIS — Z992 Dependence on renal dialysis: Secondary | ICD-10-CM | POA: Diagnosis not present

## 2023-10-19 DIAGNOSIS — N186 End stage renal disease: Secondary | ICD-10-CM | POA: Diagnosis not present

## 2023-10-20 DIAGNOSIS — Z992 Dependence on renal dialysis: Secondary | ICD-10-CM | POA: Diagnosis not present

## 2023-10-20 DIAGNOSIS — N186 End stage renal disease: Secondary | ICD-10-CM | POA: Diagnosis not present

## 2023-10-21 DIAGNOSIS — E113513 Type 2 diabetes mellitus with proliferative diabetic retinopathy with macular edema, bilateral: Secondary | ICD-10-CM | POA: Diagnosis not present

## 2023-10-21 DIAGNOSIS — Z992 Dependence on renal dialysis: Secondary | ICD-10-CM | POA: Diagnosis not present

## 2023-10-21 DIAGNOSIS — N186 End stage renal disease: Secondary | ICD-10-CM | POA: Diagnosis not present

## 2023-10-22 DIAGNOSIS — N186 End stage renal disease: Secondary | ICD-10-CM | POA: Diagnosis not present

## 2023-10-22 DIAGNOSIS — Z992 Dependence on renal dialysis: Secondary | ICD-10-CM | POA: Diagnosis not present

## 2023-10-23 DIAGNOSIS — Z992 Dependence on renal dialysis: Secondary | ICD-10-CM | POA: Diagnosis not present

## 2023-10-23 DIAGNOSIS — N186 End stage renal disease: Secondary | ICD-10-CM | POA: Diagnosis not present

## 2023-10-24 DIAGNOSIS — N186 End stage renal disease: Secondary | ICD-10-CM | POA: Diagnosis not present

## 2023-10-24 DIAGNOSIS — Z992 Dependence on renal dialysis: Secondary | ICD-10-CM | POA: Diagnosis not present

## 2023-10-25 DIAGNOSIS — Z794 Long term (current) use of insulin: Secondary | ICD-10-CM | POA: Diagnosis not present

## 2023-10-25 DIAGNOSIS — N186 End stage renal disease: Secondary | ICD-10-CM | POA: Diagnosis not present

## 2023-10-25 DIAGNOSIS — Z992 Dependence on renal dialysis: Secondary | ICD-10-CM | POA: Diagnosis not present

## 2023-10-25 DIAGNOSIS — E118 Type 2 diabetes mellitus with unspecified complications: Secondary | ICD-10-CM | POA: Diagnosis not present

## 2023-10-26 DIAGNOSIS — Z992 Dependence on renal dialysis: Secondary | ICD-10-CM | POA: Diagnosis not present

## 2023-10-26 DIAGNOSIS — N186 End stage renal disease: Secondary | ICD-10-CM | POA: Diagnosis not present

## 2023-10-27 DIAGNOSIS — Z992 Dependence on renal dialysis: Secondary | ICD-10-CM | POA: Diagnosis not present

## 2023-10-27 DIAGNOSIS — N250 Renal osteodystrophy: Secondary | ICD-10-CM | POA: Diagnosis not present

## 2023-10-27 DIAGNOSIS — N186 End stage renal disease: Secondary | ICD-10-CM | POA: Diagnosis not present

## 2023-10-27 DIAGNOSIS — D649 Anemia, unspecified: Secondary | ICD-10-CM | POA: Diagnosis not present

## 2023-10-28 ENCOUNTER — Encounter (INDEPENDENT_AMBULATORY_CARE_PROVIDER_SITE_OTHER): Payer: Self-pay | Admitting: Vascular Surgery

## 2023-10-28 ENCOUNTER — Ambulatory Visit (INDEPENDENT_AMBULATORY_CARE_PROVIDER_SITE_OTHER): Payer: Medicare Other

## 2023-10-28 ENCOUNTER — Ambulatory Visit (INDEPENDENT_AMBULATORY_CARE_PROVIDER_SITE_OTHER): Payer: Medicare Other | Admitting: Vascular Surgery

## 2023-10-28 VITALS — BP 155/77 | HR 82 | Resp 16

## 2023-10-28 DIAGNOSIS — N186 End stage renal disease: Secondary | ICD-10-CM | POA: Diagnosis not present

## 2023-10-28 DIAGNOSIS — Z992 Dependence on renal dialysis: Secondary | ICD-10-CM | POA: Diagnosis not present

## 2023-10-28 DIAGNOSIS — I739 Peripheral vascular disease, unspecified: Secondary | ICD-10-CM

## 2023-10-28 DIAGNOSIS — I10 Essential (primary) hypertension: Secondary | ICD-10-CM | POA: Diagnosis not present

## 2023-10-28 DIAGNOSIS — E1140 Type 2 diabetes mellitus with diabetic neuropathy, unspecified: Secondary | ICD-10-CM | POA: Diagnosis not present

## 2023-10-28 DIAGNOSIS — Z794 Long term (current) use of insulin: Secondary | ICD-10-CM

## 2023-10-28 NOTE — Progress Notes (Signed)
 MRN : [id number]  [name] [name] is a 63 y.o. (07-14-1960) male who presents with chief complaint of  Chief Complaint  Patient presents with   Follow-up    6 month abi follow up  .  History of Present Illness: Patient returns today in follow up of his PAD.  He is doing reasonably well in terms of his legs.  No current lifestyle limiting claudication, ischemic rest pain, or ulceration.  He has now been on peritoneal dialysis for the last year or so.  He is tolerating this reasonably well.  His ABIs today are noncompressible due to medial calcification.  His toe brachial index is 0.87 on the right 0.70 on the left which are not dramatically changed from previous studies.  Current Outpatient Medications  Medication Sig Dispense Refill   albuterol  (VENTOLIN  HFA) 108 (90 Base) MCG/ACT inhaler Inhale 1-2 puffs into the lungs every 6 (six) hours as needed for wheezing or shortness of breath. 1 each 3   aspirin  EC 81 MG tablet Take 81 mg by mouth daily. Swallow whole.     calcium  acetate (PHOSLO) 667 MG capsule Take 1,334 mg by mouth 3 (three) times daily.     carvedilol  (COREG ) 3.125 MG tablet TAKE 1 TABLET(3.125 MG) BY MOUTH TWICE DAILY 180 tablet 0   cholecalciferol  (VITAMIN D3) 25 MCG (1000 UNIT) tablet Take 2,000 Units by mouth 2 (two) times daily.     cloNIDine (CATAPRES) 0.1 MG tablet Take 1 tablet (0.1 mg total) by mouth 2 (two) times daily as needed (pressure >175).     docusate calcium  (SURFAK) 240 MG capsule Take 240 mg by mouth daily.     Evolocumab  (REPATHA  SURECLICK) 140 MG/ML SOAJ INJECT 140 MG UNDER THE SKIN EVERY 14 DAYS 6 mL 3   ezetimibe  (ZETIA ) 10 MG tablet Take 1 tablet (10 mg total) by mouth daily. 90 tablet 3   hydrALAZINE  (APRESOLINE ) 100 MG tablet Take 1 tablet (100 mg total) by mouth 3 (three) times daily. 270 tablet 3   insulin  lispro (HUMALOG ) 100 UNIT/ML KwikPen Inject into the skin.     Insulin  Pen Needle (B-D ULTRAFINE III SHORT PEN) 31G X 8 MM MISC To use with  pen basal and mealtime coverage insulin  pens 5 times daily. DX: E11.65 500 each 1   mupirocin  ointment (BACTROBAN ) 2 % Apply 1 Application topically daily. To wound on left forearm until healed. 30 g 1   nitroGLYCERIN  (NITROSTAT ) 0.4 MG SL tablet Place 1 tablet (0.4 mg total) under the tongue every 5 (five) minutes as needed for chest pain. 25 tablet 3   nortriptyline  (PAMELOR ) 10 MG capsule Take 50 mg by mouth at bedtime.     oxyCODONE -acetaminophen  (PERCOCET) 7.5-325 MG tablet Take 1 tablet by mouth every 4 (four) hours as needed for severe pain (pain score 7-10) or moderate pain (pain score 4-6).     pantoprazole  (PROTONIX ) 40 MG tablet TAKE 1 TABLET(40 MG) BY MOUTH TWICE DAILY BEFORE A MEAL 180 tablet 3   torsemide  (DEMADEX ) 20 MG tablet TAKE 1 TABLET(20 MG) BY MOUTH TWICE DAILY 180 tablet 2   TOUJEO  MAX SOLOSTAR 300 UNIT/ML Solostar Pen 60 Units daily.     amLODipine  (NORVASC ) 10 MG tablet Take 1 tablet (10 mg total) by mouth daily. 90 tablet 3   isosorbide  mononitrate (IMDUR ) 30 MG 24 hr tablet Take 1 tablet (30 mg total) by mouth 2 (two) times daily. 180 tablet 3   No current facility-administered medications for this visit.  Past Medical History:  Diagnosis Date   Anemia    Arthritis    CAD (coronary artery disease)    a. 12/2016 s/p CABG x 4 (LIMA->LAD, VG->RCA, VG->OM1, VG->D1); b. 02/2018 MV: EF 35%, small-med inferolaterlal infarct. No ischemia.   CKD (chronic kidney disease), stage IV [facility])    Peritoneal Dialysis   Colon polyps    COPD (chronic obstructive pulmonary disease) (HCC)    Dupre's syndrome    GERD (gastroesophageal reflux disease)    HFrEF (heart failure reduced ejection fraction) (HCC)    a.) 2018 EF 35%; b.) 02/2018 EF 50%; c). 02/2019 EF 40-45%; d.) 08/2019 Echo: EF 50-55%, no rwma, GrII DD; e.) 04/2020 Echo: EF 30-35%, mod-sev glob HK. Mild LVH. G2DD. Low-nl RV fxn. Mildly dil LA. Mild MR. Mild-mod Ao sclerosis w/o stenosis; f.) TTE 02/09/2021: EF 55%; septal  HK, LAE; G2DD.   History of 2019 novel coronavirus disease (COVID-19) 02/08/2021   Hyperlipidemia    Hypertension    Ischemic cardiomyopathy    a.) 02/2018 MV: EF 35%; b.) 08/2019 Echo: EF 50-55%; c.) 04/2020 Echo: EF 30-35%; d.) TTE 02/09/2021: EF 50-55%.   Lymphedema    Migraine    Myocardial infarction [facility])    Neuropathy    PAD (peripheral artery disease) (HCC)    a. 04/2017 Aortoiliac duplex: R iliac dzs; b. 03/2020 LE Duplex: mild bilat atherosclerosis throughout. Patent vessels.   Pneumonia    Retinopathy    S/P CABG x 4    a.)  4v CABG: LIMA->LAD, SVG->RCA, SVG->OM1, SVG->D1   Sleep apnea    a.) does not require nocturnal PAP therapy   Statin intolerance    Stomach ulcer    T2DM (type 2 diabetes mellitus) (HCC)     Past Surgical History:  Procedure Laterality Date   bone graft surgery Bilateral    on both feet x 2   CAPD INSERTION N/A 11/13/2021   Procedure: LAPAROSCOPIC INSERTION CONTINUOUS AMBULATORY PERITONEAL DIALYSIS  (CAPD) CATHETER;  Surgeon: Alben Alma, MD;  Location: [facility] ORS;  Service: General;  Laterality: N/A;   CAPD REVISION N/A 01/10/2022   Procedure: LAPAROSCOPIC REVISION CONTINUOUS AMBULATORY PERITONEAL DIALYSIS  (CAPD) CATHETER;  Surgeon: Alben Alma, MD;  Location: [facility] ORS;  Service: General;  Laterality: N/A;   CARDIAC CATHETERIZATION  2013   S/p PCI    CARDIAC CATHETERIZATION  2018   S/p CABG   CATARACT EXTRACTION Bilateral    COLONOSCOPY     COLONOSCOPY WITH PROPOFOL  N/A 10/17/2022   Procedure: COLONOSCOPY WITH PROPOFOL ;  Surgeon: Marnee Sink, MD;  Location: [facility] ENDOSCOPY;  Service: Endoscopy;  Laterality: N/A;   CORONARY ARTERY BYPASS GRAFT  2018   (LIMA-LAD,VG-RCA,VG-OM1,VG-D1)   ESOPHAGOGASTRODUODENOSCOPY (EGD) WITH PROPOFOL  N/A 04/04/2020   Procedure: ESOPHAGOGASTRODUODENOSCOPY (EGD) WITH PROPOFOL ;  Surgeon: Marnee Sink, MD;  Location: [facility] ENDOSCOPY;  Service: Endoscopy;  Laterality: N/A;   ESOPHAGOGASTRODUODENOSCOPY (EGD) WITH  PROPOFOL  N/A 06/06/2020   Procedure: ESOPHAGOGASTRODUODENOSCOPY (EGD) WITH PROPOFOL ;  Surgeon: Marnee Sink, MD;  Location: [facility] ENDOSCOPY;  Service: Endoscopy;  Laterality: N/A;   ESOPHAGOGASTRODUODENOSCOPY (EGD) WITH PROPOFOL  N/A 09/24/2022   Procedure: ESOPHAGOGASTRODUODENOSCOPY (EGD) WITH PROPOFOL ;  Surgeon: Marnee Sink, MD;  Location: [facility] ENDOSCOPY;  Service: Endoscopy;  Laterality: N/A;   PERIPHERAL VASCULAR CATHETERIZATION Right 10/28/2016   PTA/DEB Right SFA   WRIST SURGERY Left    cyst     Social History   Tobacco Use   Smoking status: Former    Current packs/day: 0.25    Average packs/day: 0.3  packs/day for 39.0 years (9.8 ttl pk-yrs)    Types: Cigarettes    Passive exposure: Past   Smokeless tobacco: Never  Vaping Use   Vaping status: Never Used  Substance Use Topics   Alcohol use: Not Currently    Comment: very rarely - wine   Drug use: Never       Family History  Problem Relation Age of Onset   Diabetes Mother    Heart disease Father      Allergies  Allergen Reactions   Iodinated Contrast Media Other (See Comments)    Kidney disease  Kidney disease  Kidney disease  Kidney disease   Other Other (See Comments)    Pain  With joint stiffness     Statins Other (See Comments)    Pain  With joint stiffness  Pain  With joint stiffness    Pregabalin Other (See Comments)    Generalized aches and pains Generalized aches and pains Generalized aches and pains Generalized aches and pains Generalized aches and pains   Sacubitril-Valsartan Other (See Comments)    Hyperkalemia  Hyperkalemia Hyperkalemia  Hyperkalemia   Atorvastatin Other (See Comments)    Muscle aches Muscle aches   Pravastatin Other (See Comments)    Muscle aches Muscle aches   Rosuvastatin Other (See Comments)    Muscle Aches Other reaction(s): JOINT PAIN Other reaction(s): JOINT PAIN Muscle Aches      REVIEW OF SYSTEMS (Negative unless checked)   Constitutional: [] Weight  loss  [] Fever  [] Chills Cardiac: [] Chest pain   [] Chest pressure   [x] Palpitations   [] Shortness of breath when laying flat   [] Shortness of breath at rest   [x] Shortness of breath with exertion. Vascular:  [] Pain in legs with walking   [] Pain in legs at rest   [] Pain in legs when laying flat   [] Claudication   [] Pain in feet when walking  [] Pain in feet at rest  [] Pain in feet when laying flat   [] History of DVT   [] Phlebitis   [x] Swelling in legs   [] Varicose veins   [] Non-healing ulcers Pulmonary:   [] Uses home oxygen   [] Productive cough   [] Hemoptysis   [] Wheeze  [] COPD   [] Asthma Neurologic:  [] Dizziness  [] Blackouts   [] Seizures   [] History of stroke   [] History of TIA  [] Aphasia   [] Temporary blindness   [] Dysphagia   [] Weakness or numbness in arms   [x] Weakness or numbness in legs Musculoskeletal:  [] Arthritis   [] Joint swelling   [] Joint pain   [] Low back pain Hematologic:  [] Easy bruising  [] Easy bleeding   [] Hypercoagulable state   [x] Anemic  [] Hepatitis Gastrointestinal:  [] Blood in stool   [] Vomiting blood  [] Gastroesophageal reflux/heartburn   [] Abdominal pain Genitourinary:  [x] Chronic kidney disease   [] Difficult urination  [] Frequent urination  [] Burning with urination   [] Hematuria Skin:  [] Rashes   [] Ulcers   [] Wounds Psychological:  [] History of anxiety   []  History of major depression.  Physical Examination  BP (!) 155/77   Pulse 82   Resp 16  Gen:  WD/WN, NAD Head: [place]/AT, No temporalis wasting. Ear/Nose/Throat: Hearing grossly intact, nares w/o erythema or drainage Eyes: Conjunctiva clear. Sclera non-icteric Neck: Supple.  Trachea midline Pulmonary:  Good air movement, no use of accessory muscles.  Cardiac: RRR, no JVD Vascular:  Vessel Right Left  Radial Palpable Palpable  PT Palpable Palpable  DP Palpable Palpable   Gastrointestinal: soft, PD catheter in place Musculoskeletal:  In a wheelchair. Mild LE edema. Neurologic: Sensation  grossly intact in extremities.  Symmetrical.  Speech is fluent.  Psychiatric: Judgment intact, Mood & affect appropriate for pt's clinical situation. Dermatologic: No rashes or ulcers noted.  No cellulitis or open wounds.      Labs No results found for this or any previous visit (from the past 2160 hours).  Radiology No results found.  Assessment/Plan  ESRD on dialysis [facility]) Doing PD   PAD (peripheral artery disease) (HCC) His ABIs today are noncompressible due to medial calcification.  His toe brachial index is 0.87 on the right 0.70 on the left which are not dramatically changed from previous studies.  No role for intervention at this time.  Continue good footcare and avoid ulcerations which may be difficult to heal in a patient on dialysis with diabetes.  Follow-up in 6 months.   Diabetes (HCC) blood glucose control important in reducing the progression of atherosclerotic disease. Also, involved in wound healing. On appropriate medications.     Essential hypertension blood pressure control important in reducing the progression of atherosclerotic disease. On appropriate oral medications   Maddilyn Campus, MD  10/28/2023 4:12 PM    This note was created with Dragon medical transcription system.  Any errors from dictation are purely unintentional

## 2023-10-29 DIAGNOSIS — Z992 Dependence on renal dialysis: Secondary | ICD-10-CM | POA: Diagnosis not present

## 2023-10-29 DIAGNOSIS — N186 End stage renal disease: Secondary | ICD-10-CM | POA: Diagnosis not present

## 2023-10-30 DIAGNOSIS — Z992 Dependence on renal dialysis: Secondary | ICD-10-CM | POA: Diagnosis not present

## 2023-10-30 DIAGNOSIS — E1042 Type 1 diabetes mellitus with diabetic polyneuropathy: Secondary | ICD-10-CM | POA: Diagnosis not present

## 2023-10-30 DIAGNOSIS — N186 End stage renal disease: Secondary | ICD-10-CM | POA: Diagnosis not present

## 2023-10-30 DIAGNOSIS — Z1331 Encounter for screening for depression: Secondary | ICD-10-CM | POA: Diagnosis not present

## 2023-10-30 LAB — VAS US ABI WITH/WO TBI

## 2023-10-31 DIAGNOSIS — N186 End stage renal disease: Secondary | ICD-10-CM | POA: Diagnosis not present

## 2023-10-31 DIAGNOSIS — Z992 Dependence on renal dialysis: Secondary | ICD-10-CM | POA: Diagnosis not present

## 2023-11-01 DIAGNOSIS — N186 End stage renal disease: Secondary | ICD-10-CM | POA: Diagnosis not present

## 2023-11-01 DIAGNOSIS — Z992 Dependence on renal dialysis: Secondary | ICD-10-CM | POA: Diagnosis not present

## 2023-11-02 DIAGNOSIS — Z992 Dependence on renal dialysis: Secondary | ICD-10-CM | POA: Diagnosis not present

## 2023-11-02 DIAGNOSIS — N186 End stage renal disease: Secondary | ICD-10-CM | POA: Diagnosis not present

## 2023-11-03 DIAGNOSIS — N186 End stage renal disease: Secondary | ICD-10-CM | POA: Diagnosis not present

## 2023-11-03 DIAGNOSIS — Z992 Dependence on renal dialysis: Secondary | ICD-10-CM | POA: Diagnosis not present

## 2023-11-04 DIAGNOSIS — Z992 Dependence on renal dialysis: Secondary | ICD-10-CM | POA: Diagnosis not present

## 2023-11-04 DIAGNOSIS — N186 End stage renal disease: Secondary | ICD-10-CM | POA: Diagnosis not present

## 2023-11-05 DIAGNOSIS — N186 End stage renal disease: Secondary | ICD-10-CM | POA: Diagnosis not present

## 2023-11-05 DIAGNOSIS — Z992 Dependence on renal dialysis: Secondary | ICD-10-CM | POA: Diagnosis not present

## 2023-11-06 ENCOUNTER — Ambulatory Visit: Admitting: Dermatology

## 2023-11-06 DIAGNOSIS — R69 Illness, unspecified: Secondary | ICD-10-CM

## 2023-11-06 DIAGNOSIS — D489 Neoplasm of uncertain behavior, unspecified: Secondary | ICD-10-CM

## 2023-11-06 DIAGNOSIS — D492 Neoplasm of unspecified behavior of bone, soft tissue, and skin: Secondary | ICD-10-CM | POA: Diagnosis not present

## 2023-11-06 DIAGNOSIS — N186 End stage renal disease: Secondary | ICD-10-CM | POA: Diagnosis not present

## 2023-11-06 DIAGNOSIS — W908XXA Exposure to other nonionizing radiation, initial encounter: Secondary | ICD-10-CM | POA: Diagnosis not present

## 2023-11-06 DIAGNOSIS — L578 Other skin changes due to chronic exposure to nonionizing radiation: Secondary | ICD-10-CM | POA: Diagnosis not present

## 2023-11-06 DIAGNOSIS — L723 Sebaceous cyst: Secondary | ICD-10-CM | POA: Diagnosis not present

## 2023-11-06 DIAGNOSIS — D485 Neoplasm of uncertain behavior of skin: Secondary | ICD-10-CM

## 2023-11-06 DIAGNOSIS — Z992 Dependence on renal dialysis: Secondary | ICD-10-CM | POA: Diagnosis not present

## 2023-11-06 MED ORDER — MUPIROCIN 2 % EX OINT: 1.0000 | TOPICAL_OINTMENT | Freq: Every day | CUTANEOUS | 1 refills | Status: DC

## 2023-11-06 NOTE — Progress Notes (Unsigned)
 Follow-Up Visit   Subjective  [name] [name] is a 63 y.o. male who presents for the following: L forearm lesion x 2 months, did has pus which drained, PCP rx'ed Mupirocin  2% ointment, but lesion hasn't resolved.  The patient has spots, moles and lesions to be evaluated, some may be new or changing and the patient may have concern these could be cancer.  The following portions of the chart were reviewed this encounter and updated as appropriate: medications, allergies, medical history  Review of Systems:  No other skin or systemic complaints except as noted in HPI or Assessment and Plan.  Objective  Well appearing patient in no apparent distress; mood and affect are within normal limits.  A focused examination was performed of the following areas: the face and arms  Relevant exam findings are noted in the Assessment and Plan.  L forearm 1.7 cm hyperkeratotic papule.   Assessment & Plan   NEOPLASM OF UNCERTAIN BEHAVIOR OF SKIN L forearm Epidermal / dermal shaving  Lesion diameter (cm):  1.7 Informed consent: discussed and consent obtained   Timeout: patient name, date of birth, surgical site, and procedure verified   Procedure prep:  Patient was prepped and draped in usual sterile fashion Prep type:  Isopropyl alcohol Anesthesia: the lesion was anesthetized in a standard fashion   Anesthetic:  1% lidocaine  w/ epinephrine  1-100,000 buffered w/ 8.4% NaHCO3 Instrument used: flexible razor blade   Hemostasis achieved with: pressure, aluminum chloride and electrodesiccation   Outcome: patient tolerated procedure well   Post-procedure details: sterile dressing applied and wound care instructions given   Dressing type: bandage (Mupirocin  2% ointment)    Destruction of lesion Complexity: extensive   Destruction method: electrodesiccation and curettage   Informed consent: discussed and consent obtained   Timeout:  patient name, date of birth, surgical site, and procedure  verified Procedure prep:  Patient was prepped and draped in usual sterile fashion Prep type:  Isopropyl alcohol Anesthesia: the lesion was anesthetized in a standard fashion   Anesthetic:  1% lidocaine  w/ epinephrine  1-100,000 buffered w/ 8.4% NaHCO3 Curettage performed in three different directions: Yes   Electrodesiccation performed over the curetted area: Yes   Final wound size (cm):  1.7 Hemostasis achieved with:  pressure, aluminum chloride and electrodesiccation Outcome: patient tolerated procedure well with no complications   Post-procedure details: sterile dressing applied and wound care instructions given   Dressing type: bandage and petrolatum   Specimen 1 - Surgical pathology Differential Diagnosis: D48.5 r/o inflamed ruptured cyst vs SCC vs other ED&C today Check Margins: No NEOPLASM OF UNCERTAIN BEHAVIOR   Related Medications mupirocin  ointment (BACTROBAN ) 2 % Apply 1 Application topically daily. To wound on left forearm until healed. ACTINIC SKIN DAMAGE   MULTIPLE MEDICAL PROBLEMS    ACTINIC DAMAGE - chronic, secondary to cumulative UV radiation exposure/sun exposure over time - diffuse scaly erythematous macules with underlying dyspigmentation - Recommend daily broad spectrum sunscreen SPF 30+ to sun-exposed areas, reapply every 2 hours as needed.  - Recommend staying in the shade or wearing long sleeves, sun glasses (UVA+UVB protection) and wide brim hats (4-inch brim around the entire circumference of the hat). - Call for new or changing lesions.  Multiple Medical Problems See problem lis   Return if symptoms worsen or fail to improve.  Arlinda Lais, CMA, am acting as scribe for Celine Collard, MD .   Documentation: I have reviewed the above documentation for accuracy and completeness, and I agree with the above.  Celine Collard, MD

## 2023-11-06 NOTE — Patient Instructions (Addendum)

## 2023-11-07 ENCOUNTER — Encounter: Payer: Self-pay | Admitting: Dermatology

## 2023-11-07 DIAGNOSIS — N186 End stage renal disease: Secondary | ICD-10-CM | POA: Diagnosis not present

## 2023-11-07 DIAGNOSIS — Z992 Dependence on renal dialysis: Secondary | ICD-10-CM | POA: Diagnosis not present

## 2023-11-08 DIAGNOSIS — N186 End stage renal disease: Secondary | ICD-10-CM | POA: Diagnosis not present

## 2023-11-08 DIAGNOSIS — Z992 Dependence on renal dialysis: Secondary | ICD-10-CM | POA: Diagnosis not present

## 2023-11-09 DIAGNOSIS — N186 End stage renal disease: Secondary | ICD-10-CM | POA: Diagnosis not present

## 2023-11-09 DIAGNOSIS — Z992 Dependence on renal dialysis: Secondary | ICD-10-CM | POA: Diagnosis not present

## 2023-11-10 ENCOUNTER — Ambulatory Visit: Payer: Self-pay | Admitting: Dermatology

## 2023-11-10 DIAGNOSIS — I700 Atherosclerosis of aorta: Secondary | ICD-10-CM | POA: Diagnosis not present

## 2023-11-10 DIAGNOSIS — R188 Other ascites: Secondary | ICD-10-CM | POA: Diagnosis not present

## 2023-11-10 DIAGNOSIS — I2510 Atherosclerotic heart disease of native coronary artery without angina pectoris: Secondary | ICD-10-CM | POA: Diagnosis not present

## 2023-11-10 DIAGNOSIS — Z01818 Encounter for other preprocedural examination: Secondary | ICD-10-CM | POA: Diagnosis not present

## 2023-11-10 DIAGNOSIS — Z951 Presence of aortocoronary bypass graft: Secondary | ICD-10-CM | POA: Diagnosis not present

## 2023-11-10 DIAGNOSIS — I2584 Coronary atherosclerosis due to calcified coronary lesion: Secondary | ICD-10-CM | POA: Diagnosis not present

## 2023-11-10 DIAGNOSIS — J90 Pleural effusion, not elsewhere classified: Secondary | ICD-10-CM | POA: Diagnosis not present

## 2023-11-10 DIAGNOSIS — Z992 Dependence on renal dialysis: Secondary | ICD-10-CM | POA: Diagnosis not present

## 2023-11-10 DIAGNOSIS — Z01810 Encounter for preprocedural cardiovascular examination: Secondary | ICD-10-CM | POA: Diagnosis not present

## 2023-11-10 DIAGNOSIS — R9439 Abnormal result of other cardiovascular function study: Secondary | ICD-10-CM | POA: Diagnosis not present

## 2023-11-10 DIAGNOSIS — N186 End stage renal disease: Secondary | ICD-10-CM | POA: Diagnosis not present

## 2023-11-10 LAB — SURGICAL PATHOLOGY

## 2023-11-11 DIAGNOSIS — N186 End stage renal disease: Secondary | ICD-10-CM | POA: Diagnosis not present

## 2023-11-11 DIAGNOSIS — Z992 Dependence on renal dialysis: Secondary | ICD-10-CM | POA: Diagnosis not present

## 2023-11-11 NOTE — Telephone Encounter (Signed)
 Patient informed of pathology results

## 2023-11-11 NOTE — Telephone Encounter (Signed)
-----   Message from Celine Collard sent at 11/10/2023  5:47 PM EDT ----- FINAL DIAGNOSIS        1. Skin, L forearm :       VERRUCOUS CYST, INFLAMED, BASE INVOLVED   Benign viral wart cyst Already treated May recur Will re-evaluate if recurs  ----- Message ----- From: Interface, Lab In Three Zero One Sent: 11/10/2023   5:29 PM EDT To: Elta Halter, MD

## 2023-11-12 DIAGNOSIS — N186 End stage renal disease: Secondary | ICD-10-CM | POA: Diagnosis not present

## 2023-11-12 DIAGNOSIS — Z992 Dependence on renal dialysis: Secondary | ICD-10-CM | POA: Diagnosis not present

## 2023-11-13 ENCOUNTER — Other Ambulatory Visit (HOSPITAL_COMMUNITY): Payer: Self-pay

## 2023-11-13 ENCOUNTER — Telehealth: Payer: Self-pay | Admitting: Pharmacy Technician

## 2023-11-13 ENCOUNTER — Telehealth: Payer: Self-pay | Admitting: Cardiovascular Disease

## 2023-11-13 DIAGNOSIS — N186 End stage renal disease: Secondary | ICD-10-CM | POA: Diagnosis not present

## 2023-11-13 DIAGNOSIS — Z992 Dependence on renal dialysis: Secondary | ICD-10-CM | POA: Diagnosis not present

## 2023-11-13 NOTE — Telephone Encounter (Signed)
 Pharmacy Patient Advocate Encounter   Received notification from Pt Calls Messages that prior authorization for Repatha  is required/requested.   Insurance verification completed.   The patient is insured through [facility] .   Per test claim: PA required; PA submitted to above mentioned insurance via CoverMyMeds Key/confirmation #/EOC [id number] Status is pending

## 2023-11-13 NOTE — Telephone Encounter (Signed)
 Spoke with pharmacist and made her aware that prior auth has been submitted. No further needs at this time.

## 2023-11-13 NOTE — Telephone Encounter (Signed)
 Pt c/o medication issue:  1. Name of Medication:   Evolocumab  (REPATHA  SURECLICK) 140 MG/ML SOAJ    2. How are you currently taking this medication (dosage and times per day)? As written  3. Are you having a reaction (difficulty breathing--STAT)? no  4. What is your medication issue? [facility] called to see if we received their prior auth form. I told her I didn't see anything in the chart. She is going to re-fax it.

## 2023-11-14 ENCOUNTER — Other Ambulatory Visit (HOSPITAL_COMMUNITY): Payer: Self-pay

## 2023-11-14 DIAGNOSIS — N186 End stage renal disease: Secondary | ICD-10-CM | POA: Diagnosis not present

## 2023-11-14 DIAGNOSIS — N281 Cyst of kidney, acquired: Secondary | ICD-10-CM | POA: Diagnosis not present

## 2023-11-14 DIAGNOSIS — Z992 Dependence on renal dialysis: Secondary | ICD-10-CM | POA: Diagnosis not present

## 2023-11-14 DIAGNOSIS — Q631 Lobulated, fused and horseshoe kidney: Secondary | ICD-10-CM | POA: Diagnosis not present

## 2023-11-14 NOTE — Telephone Encounter (Signed)
 Pharmacy Patient Advocate Encounter  Received notification from [facility] that Prior Authorization for Repatha  has been APPROVED from 11/13/23 to 11/12/24. Ran test claim, Copay is $135.00- 3 months. This test claim was processed through [facility]- copay amounts may vary at other pharmacies due to pharmacy/plan contracts, or as the patient moves through the different stages of their insurance plan.   PA #/Case ID/Reference #: [id number]

## 2023-11-15 DIAGNOSIS — Z992 Dependence on renal dialysis: Secondary | ICD-10-CM | POA: Diagnosis not present

## 2023-11-15 DIAGNOSIS — N186 End stage renal disease: Secondary | ICD-10-CM | POA: Diagnosis not present

## 2023-11-16 DIAGNOSIS — N186 End stage renal disease: Secondary | ICD-10-CM | POA: Diagnosis not present

## 2023-11-16 DIAGNOSIS — Z992 Dependence on renal dialysis: Secondary | ICD-10-CM | POA: Diagnosis not present

## 2023-11-17 ENCOUNTER — Telehealth: Payer: Self-pay | Admitting: Cardiovascular Disease

## 2023-11-17 DIAGNOSIS — Z992 Dependence on renal dialysis: Secondary | ICD-10-CM | POA: Diagnosis not present

## 2023-11-17 DIAGNOSIS — N186 End stage renal disease: Secondary | ICD-10-CM | POA: Diagnosis not present

## 2023-11-17 DIAGNOSIS — J449 Chronic obstructive pulmonary disease, unspecified: Secondary | ICD-10-CM | POA: Diagnosis not present

## 2023-11-17 NOTE — Telephone Encounter (Signed)
 Spoke with patient and notified of when it would be ready for pick up. He was appreciative with no further questions.

## 2023-11-17 NOTE — Telephone Encounter (Signed)
 Pharmacy is processing prescription and it should be ready after 3 pm today.

## 2023-11-17 NOTE — Telephone Encounter (Signed)
 Spoke with patient and reviewed prior shara has been done and approved. Advised that I would reach out to his pharmacy to see what may be the problem. He was appreciative with no further questions at this time.

## 2023-11-17 NOTE — Telephone Encounter (Signed)
 Pt c/o medication issue:  1. Name of Medication: Evolocumab  (REPATHA  SURECLICK) 140 MG/ML SOAJ   2. How are you currently taking this medication (dosage and times per day)? Q2weeks   3. Are you having a reaction (difficulty breathing--STAT)? No  4. What is your medication issue? Needing PA, please check with pharmacy to see what's needed, pt wasn't clear

## 2023-11-18 DIAGNOSIS — N186 End stage renal disease: Secondary | ICD-10-CM | POA: Diagnosis not present

## 2023-11-18 DIAGNOSIS — Z992 Dependence on renal dialysis: Secondary | ICD-10-CM | POA: Diagnosis not present

## 2023-11-18 DIAGNOSIS — E118 Type 2 diabetes mellitus with unspecified complications: Secondary | ICD-10-CM | POA: Diagnosis not present

## 2023-11-18 DIAGNOSIS — Z794 Long term (current) use of insulin: Secondary | ICD-10-CM | POA: Diagnosis not present

## 2023-11-19 ENCOUNTER — Ambulatory Visit: Admitting: Nurse Practitioner

## 2023-11-19 ENCOUNTER — Encounter: Payer: Self-pay | Admitting: Nurse Practitioner

## 2023-11-19 VITALS — BP 138/70 | HR 77 | Temp 98.7°F | Resp 16 | Ht 74.0 in | Wt 185.0 lb

## 2023-11-19 DIAGNOSIS — R0989 Other specified symptoms and signs involving the circulatory and respiratory systems: Secondary | ICD-10-CM

## 2023-11-19 DIAGNOSIS — R160 Hepatomegaly, not elsewhere classified: Secondary | ICD-10-CM

## 2023-11-19 DIAGNOSIS — Z992 Dependence on renal dialysis: Secondary | ICD-10-CM

## 2023-11-19 DIAGNOSIS — T148XXA Other injury of unspecified body region, initial encounter: Secondary | ICD-10-CM

## 2023-11-19 DIAGNOSIS — R233 Spontaneous ecchymoses: Secondary | ICD-10-CM | POA: Diagnosis not present

## 2023-11-19 DIAGNOSIS — N186 End stage renal disease: Secondary | ICD-10-CM | POA: Diagnosis not present

## 2023-11-19 DIAGNOSIS — R1900 Intra-abdominal and pelvic swelling, mass and lump, unspecified site: Secondary | ICD-10-CM

## 2023-11-19 DIAGNOSIS — K2100 Gastro-esophageal reflux disease with esophagitis, without bleeding: Secondary | ICD-10-CM

## 2023-11-19 DIAGNOSIS — R748 Abnormal levels of other serum enzymes: Secondary | ICD-10-CM

## 2023-11-19 MED ORDER — OXYCODONE-ACETAMINOPHEN 7.5-325 MG PO TABS: 1.0000 | ORAL_TABLET | ORAL | 0 refills | Status: DC | PRN

## 2023-11-19 MED ORDER — PANTOPRAZOLE SODIUM 40 MG PO TBEC: 40.0000 mg | DELAYED_RELEASE_TABLET | Freq: Two times a day (BID) | ORAL | 3 refills | Status: DC

## 2023-11-19 NOTE — Progress Notes (Signed)
 [name] [address] [place], [place] [address]  Internal MEDICINE  Office Visit Note  Patient Name: [name]  [id number]  [id number]  Date of Service: 11/19/2023  Chief Complaint  Patient presents with   Acute Visit   Hematoma    HPI [name] presents for a follow-up visit for enlarged liver on scan, hematoma and petechiae Hepatomegaly on recent CT scan Not a candidate for kidney transplant according to [name] due to no vascular targets. Hematoma on abdomen -- has been there for a couple of months, liver or blood issue? Has chronic anemia. Just had many labs done by [name] for kidney transplant workup. Not painful, not tender.  Petechiae on both arms -- patient concerned about blood infection or heart infection  Pain from swollen abdomen from CAPD    Current Medication: Outpatient Encounter Medications as of 11/19/2023  Medication Sig Note   albuterol  (VENTOLIN  HFA) 108 (90 Base) MCG/ACT inhaler Inhale 1-2 puffs into the lungs every 6 (six) hours as needed for wheezing or shortness of breath.    amLODipine  (NORVASC ) 10 MG tablet Take 1 tablet (10 mg total) by mouth daily.    aspirin  EC 81 MG tablet Take 81 mg by mouth daily. Swallow whole.    calcium  acetate (PHOSLO) 667 MG capsule Take 1,334 mg by mouth 3 (three) times daily. 03/24/2023: Has not started yet   carvedilol  (COREG ) 3.125 MG tablet TAKE 1 TABLET(3.125 MG) BY MOUTH TWICE DAILY    cholecalciferol  (VITAMIN D3) 25 MCG (1000 UNIT) tablet Take 2,000 Units by mouth 2 (two) times daily.    cloNIDine (CATAPRES) 0.1 MG tablet Take 1 tablet (0.1 mg total) by mouth 2 (two) times daily as needed (pressure >175).    docusate calcium  (SURFAK) 240 MG capsule Take 240 mg by mouth daily.    Evolocumab  (REPATHA  SURECLICK) 140 MG/ML SOAJ INJECT 140 MG UNDER THE SKIN EVERY 14 DAYS    ezetimibe  (ZETIA ) 10 MG tablet Take 1 tablet (10 mg total) by mouth daily.    hydrALAZINE  (APRESOLINE ) 100 MG tablet Take 1 tablet (100  mg total) by mouth 3 (three) times daily.    insulin  lispro (HUMALOG ) 100 UNIT/ML KwikPen Inject into the skin.    Insulin  Pen Needle (B-D ULTRAFINE III SHORT PEN) 31G X 8 MM MISC To use with pen basal and mealtime coverage insulin  pens 5 times daily. DX: E11.65    isosorbide  mononitrate (IMDUR ) 30 MG 24 hr tablet Take 1 tablet (30 mg total) by mouth 2 (two) times daily.    mupirocin  ointment (BACTROBAN ) 2 % Apply 1 Application topically daily. To wound on left forearm until healed.    nitroGLYCERIN  (NITROSTAT ) 0.4 MG SL tablet Place 1 tablet (0.4 mg total) under the tongue every 5 (five) minutes as needed for chest pain.    nortriptyline  (PAMELOR ) 10 MG capsule Take 50 mg by mouth at bedtime.    torsemide  (DEMADEX ) 20 MG tablet TAKE 1 TABLET(20 MG) BY MOUTH TWICE DAILY    TOUJEO  MAX SOLOSTAR 300 UNIT/ML Solostar Pen 60 Units daily.    [DISCONTINUED] oxyCODONE -acetaminophen  (PERCOCET) 7.5-325 MG tablet Take 1 tablet by mouth every 4 (four) hours as needed for severe pain (pain score 7-10) or moderate pain (pain score 4-6).    [DISCONTINUED] pantoprazole  (PROTONIX ) 40 MG tablet TAKE 1 TABLET(40 MG) BY MOUTH TWICE DAILY BEFORE A MEAL    oxyCODONE -acetaminophen  (PERCOCET) 7.5-325 MG tablet Take 1 tablet by mouth every 4 (four) hours as needed for severe pain (pain score  7-10) or moderate pain (pain score 4-6).    pantoprazole  (PROTONIX ) 40 MG tablet Take 1 tablet (40 mg total) by mouth 2 (two) times daily before a meal.    No facility-administered encounter medications on file as of 11/19/2023.    Surgical History: Past Surgical History:  Procedure Laterality Date   bone graft surgery Bilateral    on both feet x 2   CAPD INSERTION N/A 11/13/2021   Procedure: LAPAROSCOPIC INSERTION CONTINUOUS AMBULATORY PERITONEAL DIALYSIS  (CAPD) CATHETER;  Surgeon: [name] Laneta FALCON, MD;  Location: [facility];  Service: General;  Laterality: N/A;   CAPD REVISION N/A 01/10/2022   Procedure: LAPAROSCOPIC REVISION  CONTINUOUS AMBULATORY PERITONEAL DIALYSIS  (CAPD) CATHETER;  Surgeon: [name] Laneta FALCON, MD;  Location: [facility];  Service: General;  Laterality: N/A;   CARDIAC CATHETERIZATION  2013   S/p PCI    CARDIAC CATHETERIZATION  2018   S/p CABG   CATARACT EXTRACTION Bilateral    COLONOSCOPY     COLONOSCOPY WITH PROPOFOL  N/A 10/17/2022   Procedure: COLONOSCOPY WITH PROPOFOL ;  Surgeon: [name] Carmine, MD;  Location: [facility];  Service: Endoscopy;  Laterality: N/A;   CORONARY ARTERY BYPASS GRAFT  2018   (LIMA-LAD,VG-RCA,VG-OM1,VG-D1)   ESOPHAGOGASTRODUODENOSCOPY (EGD) WITH PROPOFOL  N/A 04/04/2020   Procedure: ESOPHAGOGASTRODUODENOSCOPY (EGD) WITH PROPOFOL ;  Surgeon: [name] Carmine, MD;  Location: [facility];  Service: Endoscopy;  Laterality: N/A;   ESOPHAGOGASTRODUODENOSCOPY (EGD) WITH PROPOFOL  N/A 06/06/2020   Procedure: ESOPHAGOGASTRODUODENOSCOPY (EGD) WITH PROPOFOL ;  Surgeon: [name] Carmine, MD;  Location: [facility];  Service: Endoscopy;  Laterality: N/A;   ESOPHAGOGASTRODUODENOSCOPY (EGD) WITH PROPOFOL  N/A 09/24/2022   Procedure: ESOPHAGOGASTRODUODENOSCOPY (EGD) WITH PROPOFOL ;  Surgeon: [name] Carmine, MD;  Location: [facility];  Service: Endoscopy;  Laterality: N/A;   PERIPHERAL VASCULAR CATHETERIZATION Right 10/28/2016   PTA/DEB Right SFA   WRIST SURGERY Left    cyst    Medical History: Past Medical History:  Diagnosis Date   Anemia    Arthritis    CAD (coronary artery disease)    a. 12/2016 s/p CABG x 4 (LIMA->LAD, VG->RCA, VG->OM1, VG->D1); b. 02/2018 MV: EF 35%, small-med inferolaterlal infarct. No ischemia.   CKD (chronic kidney disease), stage IV [name])    Peritoneal Dialysis   Colon polyps    COPD (chronic obstructive pulmonary disease) (HCC)    Dupre's [name]    GERD (gastroesophageal reflux disease)    HFrEF (heart failure reduced ejection fraction) (HCC)    a.) 2018 EF 35%; b.) 02/2018 EF 50%; c). 02/2019 EF 40-45%; d.) 08/2019 Echo: EF 50-55%, no rwma, GrII DD; e.)  04/2020 Echo: EF 30-35%, mod-sev glob HK. Mild LVH. G2DD. Low-nl RV fxn. Mildly dil LA. Mild MR. Mild-mod Ao sclerosis w/o stenosis; f.) TTE 02/09/2021: EF 55%; septal HK, LAE; G2DD.   History of 2019 novel coronavirus disease (COVID-19) 02/08/2021   Hyperlipidemia    Hypertension    Ischemic cardiomyopathy    a.) 02/2018 MV: EF 35%; b.) 08/2019 Echo: EF 50-55%; c.) 04/2020 Echo: EF 30-35%; d.) TTE 02/09/2021: EF 50-55%.   Lymphedema    Migraine    Myocardial infarction [facility])    Neuropathy    PAD (peripheral artery disease) (HCC)    a. 04/2017 Aortoiliac duplex: R iliac dzs; b. 03/2020 LE Duplex: mild bilat atherosclerosis throughout. Patent vessels.   Pneumonia    Retinopathy    S/P CABG x 4    a.)  4v CABG: LIMA->LAD, SVG->RCA, SVG->OM1, SVG->D1   Sleep apnea    a.) does not require  nocturnal PAP therapy   Statin intolerance    Stomach ulcer    T2DM (type 2 diabetes mellitus) (HCC)     Family History: Family History  Problem Relation Age of Onset   Diabetes Mother    Heart disease Father     Social History   Socioeconomic History   Marital status: Married    Spouse name: Roxanne   Number of children: Not on file   Years of education: Not on file   Highest education level: Not on file  Occupational History   Not on file  Tobacco Use   Smoking status: Former    Current packs/day: 0.25    Average packs/day: 0.3 packs/day for 39.0 years (9.8 ttl pk-yrs)    Types: Cigarettes    Passive exposure: Past   Smokeless tobacco: Never  Vaping Use   Vaping status: Never Used  Substance and Sexual Activity   Alcohol use: Not Currently    Comment: very rarely - wine   Drug use: Never   Sexual activity: Not Currently  Other Topics Concern   Not on file  Social History Narrative   Not on file   Social Drivers of Health   Financial Resource Strain: Low Risk  (10/30/2023)   Received from [facility]   Overall Financial Resource Strain (CARDIA)     Difficulty of Paying Living Expenses: Not hard at all  Food Insecurity: No Food Insecurity (10/30/2023)   Received from [name]   Hunger Vital Sign    Within the past 12 months, you worried that your food would run out before you got the money to buy more.: Never true    Within the past 12 months, the food you bought just didn't last and you didn't have money to get more.: Never true  Transportation Needs: No Transportation Needs (10/30/2023)   Received from [facility] - Transportation    In the past 12 months, has lack of transportation kept you from medical appointments or from getting medications?: No    Lack of Transportation (Non-Medical): No  Physical Activity: Unknown (09/09/2021)   Received from [facility]   Exercise Vital Sign    On average, how many days per week do you engage in moderate to strenuous exercise (like a brisk walk)?: 0 days    Minutes of Exercise per Session: Not on file  Stress: No Stress Concern Present (02/23/2023)   [employer] [facility] [employer] - Occupational Stress Questionnaire    Feeling of Stress : Only a little  Social Connections: Unknown (09/17/2022)   Received from [facility]   Social Network    Social Network: Not on file  Intimate Partner Violence: Not At Risk (03/24/2023)   Humiliation, Afraid, Rape, and Kick questionnaire    Fear of Current or Ex-Partner: No    Emotionally Abused: No    Physically Abused: No    Sexually Abused: No      Review of Systems  Constitutional:  Positive for fatigue.  Respiratory: Negative.  Negative for cough, chest tightness, shortness of breath and wheezing.   Cardiovascular: Negative.  Negative for chest pain and palpitations.  Gastrointestinal:  Positive for abdominal distention.    Vital Signs: BP 138/70   Pulse 77   Temp 98.7 F (37.1 C)   Resp 16   Ht 6' 2 (1.88 m)   Wt 185 lb (83.9 kg)   SpO2 97%   BMI 23.75 kg/m  Physical  Exam Vitals reviewed.  Constitutional:      General: He is not in acute distress.    Appearance: Normal appearance. He is normal weight. He is not ill-appearing.  HENT:     Head: Normocephalic and atraumatic.   Eyes:     Pupils: Pupils are equal, round, and reactive to light.    Cardiovascular:     Rate and Rhythm: Normal rate and regular rhythm.  Pulmonary:     Effort: No respiratory distress.   Skin:    General: Skin is cool.     Capillary Refill: Capillary refill takes less than 2 seconds.     Findings: Bruising (hematoma to right lower abdomen.) and petechiae (on bilateral upper arms) present.   Neurological:     Mental Status: He is alert and oriented to person, place, and time.   Psychiatric:        Mood and Affect: Mood normal.        Behavior: Behavior normal.        Assessment/Plan: 1. Hepatomegaly (Primary) Referred to [name] hepatology - Amb Referral to Hepatology  2. Petechiae or ecchymoses Blood culture ordered  - Blood culture (routine single)  3. Hematoma Blood culture ordered  - Blood culture (routine single)  4. Endocarditis, suspected Blood culture ordered  - Blood culture (routine single)  5. Swollen belly Prn oxycodone  prescribed for pain associated with stretching of his abdomen when doing peritoneal dialysis, currently he is having to infuse 2 liters of dialysate every night into the peritoneal cavity.  - oxyCODONE -acetaminophen  (PERCOCET) 7.5-325 MG tablet; Take 1 tablet by mouth every 4 (four) hours as needed for severe pain (pain score 7-10) or moderate pain (pain score 4-6).  Dispense: 20 tablet; Refill: 0  6. Abnormal liver enzymes Referred to [facility] hepatology  - Amb Referral to Hepatology  7. Gastroesophageal reflux disease with esophagitis without hemorrhage Continue pantoprazole  as prescribed.  - pantoprazole  (PROTONIX ) 40 MG tablet; Take 1 tablet (40 mg total) by mouth 2 (two) times daily before a meal.  Dispense: 180 tablet;  Refill: 3  8. Peritoneal dialysis catheter in place [facility]) Prn oxycodone  prescribed.  - oxyCODONE -acetaminophen  (PERCOCET) 7.5-325 MG tablet; Take 1 tablet by mouth every 4 (four) hours as needed for severe pain (pain score 7-10) or moderate pain (pain score 4-6).  Dispense: 20 tablet; Refill: 0   General Counseling: hiep ollis understanding of the findings of todays visit and agrees with plan of treatment. I have discussed any further diagnostic evaluation that may be needed or ordered today. We also reviewed his medications today. he has been encouraged to call the office with any questions or concerns that should arise related to todays visit.    Orders Placed This Encounter  Procedures   Blood culture (routine single)   Amb Referral to Hepatology    Meds ordered this encounter  Medications   oxyCODONE -acetaminophen  (PERCOCET) 7.5-325 MG tablet    Sig: Take 1 tablet by mouth every 4 (four) hours as needed for severe pain (pain score 7-10) or moderate pain (pain score 4-6).    Dispense:  20 tablet    Refill:  0    Fill new script today   pantoprazole  (PROTONIX ) 40 MG tablet    Sig: Take 1 tablet (40 mg total) by mouth 2 (two) times daily before a meal.    Dispense:  180 tablet    Refill:  3    For future refills    Return if symptoms worsen or  fail to improve, for referral to hepatology.   Total time spent:30 Minutes Time spent includes review of chart, medications, test results, and follow up plan with the patient.   [place] Controlled Substance Database was reviewed by me.  This patient was seen by Mardy Maxin, FNP-C in collaboration with Dr. Sigrid Bathe as a part of collaborative care agreement.   Emberley Kral R. Maxin, MSN, FNP-C Internal medicine

## 2023-11-20 ENCOUNTER — Telehealth: Payer: Self-pay | Admitting: Nurse Practitioner

## 2023-11-20 DIAGNOSIS — E1140 Type 2 diabetes mellitus with diabetic neuropathy, unspecified: Secondary | ICD-10-CM | POA: Diagnosis not present

## 2023-11-20 DIAGNOSIS — E1169 Type 2 diabetes mellitus with other specified complication: Secondary | ICD-10-CM | POA: Diagnosis not present

## 2023-11-20 DIAGNOSIS — E785 Hyperlipidemia, unspecified: Secondary | ICD-10-CM | POA: Diagnosis not present

## 2023-11-20 DIAGNOSIS — Z992 Dependence on renal dialysis: Secondary | ICD-10-CM | POA: Diagnosis not present

## 2023-11-20 DIAGNOSIS — Z794 Long term (current) use of insulin: Secondary | ICD-10-CM | POA: Diagnosis not present

## 2023-11-20 DIAGNOSIS — Z1330 Encounter for screening examination for mental health and behavioral disorders, unspecified: Secondary | ICD-10-CM | POA: Diagnosis not present

## 2023-11-20 DIAGNOSIS — N186 End stage renal disease: Secondary | ICD-10-CM | POA: Diagnosis not present

## 2023-11-20 DIAGNOSIS — E11319 Type 2 diabetes mellitus with unspecified diabetic retinopathy without macular edema: Secondary | ICD-10-CM | POA: Diagnosis not present

## 2023-11-20 DIAGNOSIS — E1122 Type 2 diabetes mellitus with diabetic chronic kidney disease: Secondary | ICD-10-CM | POA: Diagnosis not present

## 2023-11-20 NOTE — Telephone Encounter (Signed)
 Awaiting 11/19/23 office notes for Hepatology referral-Toni

## 2023-11-21 DIAGNOSIS — Z992 Dependence on renal dialysis: Secondary | ICD-10-CM | POA: Diagnosis not present

## 2023-11-21 DIAGNOSIS — N186 End stage renal disease: Secondary | ICD-10-CM | POA: Diagnosis not present

## 2023-11-22 DIAGNOSIS — Z992 Dependence on renal dialysis: Secondary | ICD-10-CM | POA: Diagnosis not present

## 2023-11-22 DIAGNOSIS — N186 End stage renal disease: Secondary | ICD-10-CM | POA: Diagnosis not present

## 2023-11-23 DIAGNOSIS — Z992 Dependence on renal dialysis: Secondary | ICD-10-CM | POA: Diagnosis not present

## 2023-11-23 DIAGNOSIS — N186 End stage renal disease: Secondary | ICD-10-CM | POA: Diagnosis not present

## 2023-11-24 DIAGNOSIS — Z992 Dependence on renal dialysis: Secondary | ICD-10-CM | POA: Diagnosis not present

## 2023-11-24 DIAGNOSIS — N186 End stage renal disease: Secondary | ICD-10-CM | POA: Diagnosis not present

## 2023-11-25 DIAGNOSIS — Z992 Dependence on renal dialysis: Secondary | ICD-10-CM | POA: Diagnosis not present

## 2023-11-25 DIAGNOSIS — N186 End stage renal disease: Secondary | ICD-10-CM | POA: Diagnosis not present

## 2023-11-26 ENCOUNTER — Ambulatory Visit: Admitting: Podiatry

## 2023-11-26 DIAGNOSIS — E559 Vitamin D deficiency, unspecified: Secondary | ICD-10-CM | POA: Diagnosis not present

## 2023-11-26 DIAGNOSIS — E109 Type 1 diabetes mellitus without complications: Secondary | ICD-10-CM | POA: Diagnosis not present

## 2023-11-26 DIAGNOSIS — Z79899 Other long term (current) drug therapy: Secondary | ICD-10-CM | POA: Diagnosis not present

## 2023-11-26 DIAGNOSIS — D631 Anemia in chronic kidney disease: Secondary | ICD-10-CM | POA: Diagnosis not present

## 2023-11-26 DIAGNOSIS — N186 End stage renal disease: Secondary | ICD-10-CM | POA: Diagnosis not present

## 2023-11-26 DIAGNOSIS — Z992 Dependence on renal dialysis: Secondary | ICD-10-CM | POA: Diagnosis not present

## 2023-11-26 DIAGNOSIS — Z5181 Encounter for therapeutic drug level monitoring: Secondary | ICD-10-CM | POA: Diagnosis not present

## 2023-11-26 DIAGNOSIS — D649 Anemia, unspecified: Secondary | ICD-10-CM | POA: Diagnosis not present

## 2023-11-26 DIAGNOSIS — N250 Renal osteodystrophy: Secondary | ICD-10-CM | POA: Diagnosis not present

## 2023-11-27 DIAGNOSIS — N186 End stage renal disease: Secondary | ICD-10-CM | POA: Diagnosis not present

## 2023-11-27 DIAGNOSIS — Z992 Dependence on renal dialysis: Secondary | ICD-10-CM | POA: Diagnosis not present

## 2023-11-28 DIAGNOSIS — N186 End stage renal disease: Secondary | ICD-10-CM | POA: Diagnosis not present

## 2023-11-28 DIAGNOSIS — Z992 Dependence on renal dialysis: Secondary | ICD-10-CM | POA: Diagnosis not present

## 2023-11-29 DIAGNOSIS — N186 End stage renal disease: Secondary | ICD-10-CM | POA: Diagnosis not present

## 2023-11-29 DIAGNOSIS — Z992 Dependence on renal dialysis: Secondary | ICD-10-CM | POA: Diagnosis not present

## 2023-11-30 DIAGNOSIS — N186 End stage renal disease: Secondary | ICD-10-CM | POA: Diagnosis not present

## 2023-11-30 DIAGNOSIS — Z992 Dependence on renal dialysis: Secondary | ICD-10-CM | POA: Diagnosis not present

## 2023-12-01 DIAGNOSIS — N186 End stage renal disease: Secondary | ICD-10-CM | POA: Diagnosis not present

## 2023-12-01 DIAGNOSIS — Z992 Dependence on renal dialysis: Secondary | ICD-10-CM | POA: Diagnosis not present

## 2023-12-02 ENCOUNTER — Telehealth: Payer: Self-pay | Admitting: Cardiovascular Disease

## 2023-12-02 DIAGNOSIS — N186 End stage renal disease: Secondary | ICD-10-CM | POA: Diagnosis not present

## 2023-12-02 DIAGNOSIS — Z992 Dependence on renal dialysis: Secondary | ICD-10-CM | POA: Diagnosis not present

## 2023-12-02 MED ORDER — CARVEDILOL 3.125 MG PO TABS: 3.1250 mg | ORAL_TABLET | Freq: Two times a day (BID) | ORAL | 1 refills | Status: DC

## 2023-12-02 NOTE — Telephone Encounter (Signed)
 Pt's medication was sent to pt's pharmacy as requested. Confirmation received.

## 2023-12-02 NOTE — Telephone Encounter (Signed)
*  STAT* If patient is at the pharmacy, call can be transferred to refill team.   1. Which medications need to be refilled? (please list name of each medication and dose if known)   carvedilol  (COREG ) 3.125 MG tablet    4. Which pharmacy/location (including street and city if local pharmacy) is medication to be sent to? [id number] - [place], [place] - [address] AT [facility] OF [address]    5. Do they need a 30 day or 90 day supply? 90  Pt scheduled for 02/16/24

## 2023-12-03 DIAGNOSIS — N186 End stage renal disease: Secondary | ICD-10-CM | POA: Diagnosis not present

## 2023-12-03 DIAGNOSIS — Z992 Dependence on renal dialysis: Secondary | ICD-10-CM | POA: Diagnosis not present

## 2023-12-04 DIAGNOSIS — N186 End stage renal disease: Secondary | ICD-10-CM | POA: Diagnosis not present

## 2023-12-04 DIAGNOSIS — Z992 Dependence on renal dialysis: Secondary | ICD-10-CM | POA: Diagnosis not present

## 2023-12-05 DIAGNOSIS — N186 End stage renal disease: Secondary | ICD-10-CM | POA: Diagnosis not present

## 2023-12-05 DIAGNOSIS — Z992 Dependence on renal dialysis: Secondary | ICD-10-CM | POA: Diagnosis not present

## 2023-12-06 DIAGNOSIS — Z992 Dependence on renal dialysis: Secondary | ICD-10-CM | POA: Diagnosis not present

## 2023-12-06 DIAGNOSIS — N186 End stage renal disease: Secondary | ICD-10-CM | POA: Diagnosis not present

## 2023-12-07 DIAGNOSIS — Z992 Dependence on renal dialysis: Secondary | ICD-10-CM | POA: Diagnosis not present

## 2023-12-07 DIAGNOSIS — N186 End stage renal disease: Secondary | ICD-10-CM | POA: Diagnosis not present

## 2023-12-08 DIAGNOSIS — Z992 Dependence on renal dialysis: Secondary | ICD-10-CM | POA: Diagnosis not present

## 2023-12-08 DIAGNOSIS — N186 End stage renal disease: Secondary | ICD-10-CM | POA: Diagnosis not present

## 2023-12-09 ENCOUNTER — Encounter: Payer: Self-pay | Admitting: Gastroenterology

## 2023-12-09 ENCOUNTER — Ambulatory Visit: Admitting: Gastroenterology

## 2023-12-09 ENCOUNTER — Other Ambulatory Visit (INDEPENDENT_AMBULATORY_CARE_PROVIDER_SITE_OTHER)

## 2023-12-09 VITALS — BP 130/64 | Ht 74.0 in | Wt 188.7 lb

## 2023-12-09 DIAGNOSIS — K921 Melena: Secondary | ICD-10-CM

## 2023-12-09 DIAGNOSIS — K8020 Calculus of gallbladder without cholecystitis without obstruction: Secondary | ICD-10-CM

## 2023-12-09 DIAGNOSIS — Z860100 Personal history of colon polyps, unspecified: Secondary | ICD-10-CM

## 2023-12-09 DIAGNOSIS — R748 Abnormal levels of other serum enzymes: Secondary | ICD-10-CM | POA: Diagnosis not present

## 2023-12-09 DIAGNOSIS — K760 Fatty (change of) liver, not elsewhere classified: Secondary | ICD-10-CM

## 2023-12-09 DIAGNOSIS — D649 Anemia, unspecified: Secondary | ICD-10-CM

## 2023-12-09 DIAGNOSIS — R1030 Lower abdominal pain, unspecified: Secondary | ICD-10-CM

## 2023-12-09 DIAGNOSIS — K22719 Barrett's esophagus with dysplasia, unspecified: Secondary | ICD-10-CM

## 2023-12-09 DIAGNOSIS — D509 Iron deficiency anemia, unspecified: Secondary | ICD-10-CM | POA: Diagnosis not present

## 2023-12-09 DIAGNOSIS — Z992 Dependence on renal dialysis: Secondary | ICD-10-CM | POA: Diagnosis not present

## 2023-12-09 DIAGNOSIS — Z860101 Personal history of adenomatous and serrated colon polyps: Secondary | ICD-10-CM

## 2023-12-09 DIAGNOSIS — K819 Cholecystitis, unspecified: Secondary | ICD-10-CM

## 2023-12-09 DIAGNOSIS — R160 Hepatomegaly, not elsewhere classified: Secondary | ICD-10-CM | POA: Diagnosis not present

## 2023-12-09 DIAGNOSIS — N186 End stage renal disease: Secondary | ICD-10-CM | POA: Diagnosis not present

## 2023-12-09 DIAGNOSIS — K529 Noninfective gastroenteritis and colitis, unspecified: Secondary | ICD-10-CM

## 2023-12-09 DIAGNOSIS — K5909 Other constipation: Secondary | ICD-10-CM

## 2023-12-09 LAB — CBC WITH DIFFERENTIAL/PLATELET
Basophils Absolute: 0.1 K/uL (ref 0.0–0.1)
Basophils Relative: 1.3 % (ref 0.0–3.0)
Eosinophils Absolute: 0.3 K/uL (ref 0.0–0.7)
Eosinophils Relative: 4.1 % (ref 0.0–5.0)
HCT: 31.2 % — ABNORMAL LOW (ref 39.0–52.0)
Hemoglobin: 10.4 g/dL — ABNORMAL LOW (ref 13.0–17.0)
Lymphocytes Relative: 16.2 % (ref 12.0–46.0)
Lymphs Abs: 1.2 K/uL (ref 0.7–4.0)
MCHC: 33.5 g/dL (ref 30.0–36.0)
MCV: 92 fl (ref 78.0–100.0)
Monocytes Absolute: 0.7 K/uL (ref 0.1–1.0)
Monocytes Relative: 9.1 % (ref 3.0–12.0)
Neutro Abs: 5.2 K/uL (ref 1.4–7.7)
Neutrophils Relative %: 69.3 % (ref 43.0–77.0)
Platelets: 250 K/uL (ref 150.0–400.0)
RBC: 3.39 Mil/uL — ABNORMAL LOW (ref 4.22–5.81)
RDW: 16.3 % — ABNORMAL HIGH (ref 11.5–15.5)
WBC: 7.5 K/uL (ref 4.0–10.5)

## 2023-12-09 LAB — HEPATIC FUNCTION PANEL
ALT: 28 U/L (ref 0–53)
AST: 21 U/L (ref 0–37)
Albumin: 3.5 g/dL (ref 3.5–5.2)
Alkaline Phosphatase: 248 U/L — ABNORMAL HIGH (ref 39–117)
Bilirubin, Direct: 0.2 mg/dL (ref 0.0–0.3)
Total Bilirubin: 0.7 mg/dL (ref 0.2–1.2)
Total Protein: 6.6 g/dL (ref 6.0–8.3)

## 2023-12-09 LAB — IBC + FERRITIN
Ferritin: 286.3 ng/mL (ref 22.0–322.0)
Iron: 46 ug/dL (ref 42–165)
Saturation Ratios: 17.3 % — ABNORMAL LOW (ref 20.0–50.0)
TIBC: 266 ug/dL (ref 250.0–450.0)
Transferrin: 190 mg/dL — ABNORMAL LOW (ref 212.0–360.0)

## 2023-12-09 LAB — PROTIME-INR
INR: 1.3 ratio — ABNORMAL HIGH (ref 0.8–1.0)
Prothrombin Time: 13.6 s — ABNORMAL HIGH (ref 9.6–13.1)

## 2023-12-09 NOTE — Progress Notes (Signed)
 Chief Complaint: Hepatomegaly , abnormal liver enzymes .  Primary GI Doctor: Dr. Federico  HPI:  Patient is a  63  year old male patient with past medical history of CAD, CABG 2018 quadruple bypass ,Chronic systolic heart failure, CKD IV, GERD, DM type 2, who was referred to me by Liana Fish, NP on 11/19/23 for a complaint of hepatomegaly , abnormal liver enzymes .    Interval History    Patient presents for evaluation of abnormal liver enzymes, accompanied by his wife. Patient reports no known history of fatty liver.  We reviewed his history and no imaging to compare to recent imaging.  No history of blood transfusions. No history of hepatitis. Recently got hepatitis B vaccines due to lack of immunity. Denies IV drug use.     Patient does report history of iron deficiency he thinks Ijeoma Loor have started about the time he was started on dialysis about 2 years ago.  Patient receives IV iron infusions as needed.  Patient does report over the last 1 to 2 weeks he has noted dark stools.  Patient does admit he takes over-the-counter Pepto from time to time and took 2 doses over the course of the last 2 weeks.  Patient states even when he does not take the Pepto-Bismol he notices his stool is dark. Patient has history of Barrett's esophagus and currently taking pantoprazole  twice daily.  Patient denies dysphagia.  Patient does have intermittent nausea every 1-2 mths.    Patient reports lower abdominal pain over the course of the last few weeks.  Patient reports he has been using a new peritoneal solution over the last two weeks and it is painful every time he uses it.  He reports he Masahiro Iglesia have the lower abdominal discomfort even when he is not doing peritoneal dialysis.     Patient also reports he has issues with constipation and using stool softeners up to 4 pills/daily. He reports he has had to manually disimpact himself at times.  He reports that the pain does not improve or get worse with  constipation.  Patient uses electric wheelchair. Transport is difficult with his peripheral nephropathy.   Patient baby ASA 81 mg po daily  Socially, history of alcohol use several years ago. Maybe 2 beers a year.   Former smoker quit 2013.  No family history of liver disease or CA.  Father with history of stomach ulcers.  Patient's last procedures listed below.  Wt Readings from Last 3 Encounters:  12/09/23 188 lb 11.4 oz (85.6 kg)  11/19/23 185 lb (83.9 kg)  09/19/23 197 lb (89.4 kg)    Past Medical History:  Diagnosis Date   Anemia    Arthritis    CAD (coronary artery disease)    a. 12/2016 s/p CABG x 4 (LIMA->LAD, VG->RCA, VG->OM1, VG->D1); b. 02/2018 MV: EF 35%, small-med inferolaterlal infarct. No ischemia.   CKD (chronic kidney disease), stage IV [facility])    Peritoneal Dialysis   Colon polyps    COPD (chronic obstructive pulmonary disease) (HCC)    Dupre's syndrome    GERD (gastroesophageal reflux disease)    HFrEF (heart failure reduced ejection fraction) (HCC)    a.) 2018 EF 35%; b.) 02/2018 EF 50%; c). 02/2019 EF 40-45%; d.) 08/2019 Echo: EF 50-55%, no rwma, GrII DD; e.) 04/2020 Echo: EF 30-35%, mod-sev glob HK. Mild LVH. G2DD. Low-nl RV fxn. Mildly dil LA. Mild MR. Mild-mod Ao sclerosis w/o stenosis; f.) TTE 02/09/2021: EF 55%; septal HK, LAE; G2DD.  History of 2019 novel coronavirus disease (COVID-19) 02/08/2021   Hyperlipidemia    Hypertension    Ischemic cardiomyopathy    a.) 02/2018 MV: EF 35%; b.) 08/2019 Echo: EF 50-55%; c.) 04/2020 Echo: EF 30-35%; d.) TTE 02/09/2021: EF 50-55%.   Lymphedema    Migraine    Myocardial infarction [facility])    Neuropathy    PAD (peripheral artery disease) (HCC)    a. 04/2017 Aortoiliac duplex: R iliac dzs; b. 03/2020 LE Duplex: mild bilat atherosclerosis throughout. Patent vessels.   Pneumonia    Retinopathy    S/P CABG x 4    a.)  4v CABG: LIMA->LAD, SVG->RCA, SVG->OM1, SVG->D1   Sleep apnea    a.) does not require nocturnal  PAP therapy   Statin intolerance    Stomach ulcer    T2DM (type 2 diabetes mellitus) (HCC)     Past Surgical History:  Procedure Laterality Date   bone graft surgery Bilateral    on both feet x 2   CAPD INSERTION N/A 11/13/2021   Procedure: LAPAROSCOPIC INSERTION CONTINUOUS AMBULATORY PERITONEAL DIALYSIS  (CAPD) CATHETER;  Surgeon: Jordis Laneta FALCON, MD;  Location: [facility] ORS;  Service: General;  Laterality: N/A;   CAPD REVISION N/A 01/10/2022   Procedure: LAPAROSCOPIC REVISION CONTINUOUS AMBULATORY PERITONEAL DIALYSIS  (CAPD) CATHETER;  Surgeon: Jordis Laneta FALCON, MD;  Location: [facility] ORS;  Service: General;  Laterality: N/A;   CARDIAC CATHETERIZATION  2013   S/p PCI    CARDIAC CATHETERIZATION  2018   S/p CABG   CATARACT EXTRACTION Bilateral    COLONOSCOPY     COLONOSCOPY WITH PROPOFOL  N/A 10/17/2022   Procedure: COLONOSCOPY WITH PROPOFOL ;  Surgeon: Jinny Carmine, MD;  Location: [facility] ENDOSCOPY;  Service: Endoscopy;  Laterality: N/A;   CORONARY ARTERY BYPASS GRAFT  2018   (LIMA-LAD,VG-RCA,VG-OM1,VG-D1)   ESOPHAGOGASTRODUODENOSCOPY (EGD) WITH PROPOFOL  N/A 04/04/2020   Procedure: ESOPHAGOGASTRODUODENOSCOPY (EGD) WITH PROPOFOL ;  Surgeon: Jinny Carmine, MD;  Location: [facility] ENDOSCOPY;  Service: Endoscopy;  Laterality: N/A;   ESOPHAGOGASTRODUODENOSCOPY (EGD) WITH PROPOFOL  N/A 06/06/2020   Procedure: ESOPHAGOGASTRODUODENOSCOPY (EGD) WITH PROPOFOL ;  Surgeon: Jinny Carmine, MD;  Location: [facility] END[facility]COPY;  Service: Endoscopy;  Laterality: N/A;   ESOPHAGOGASTRODUODENOSCOPY (EGD) WITH PROPOFOL  N/A 09/24/2022   Procedure: ESOPHAGOGASTRODUODENOSCOPY (EGD) WITH PROPOFOL ;  Surgeon: Jinny Carmine, MD;  Location: [facility] ENDOSCOPY;  Service: Endoscopy;  Laterality: N/A;   PERIPHERAL VASCULAR CATHETERIZATION Right 10/28/2016   PTA/DEB Right SFA   WRIST SURGERY Left    cyst    Current Outpatient Medications  Medication Sig Dispense Refill   albuterol  (VENTOLIN  HFA) 108 (90 Base) MCG/ACT inhaler Inhale 1-2 puffs into  the lungs every 6 (six) hours as needed for wheezing or shortness of breath. 1 each 3   amLODipine  (NORVASC ) 10 MG tablet Take 1 tablet (10 mg total) by mouth daily. 90 tablet 3   aspirin  EC 81 MG tablet Take 81 mg by mouth daily. Swallow whole.     carvedilol  (COREG ) 3.125 MG tablet Take 1 tablet (3.125 mg total) by mouth 2 (two) times daily with a meal. 180 tablet 1   cholecalciferol  (VITAMIN D3) 25 MCG (1000 UNIT) tablet Take 2,000 Units by mouth 2 (two) times daily.     cloNIDine (CATAPRES) 0.1 MG tablet Take 1 tablet (0.1 mg total) by mouth 2 (two) times daily as needed (pressure >175).     Evolocumab  (REPATHA  SURECLICK) 140 MG/ML SOAJ INJECT 140 MG UNDER THE SKIN EVERY 14 DAYS 6 mL 3   ezetimibe  (ZETIA ) 10 MG tablet Take 1  tablet (10 mg total) by mouth daily. 90 tablet 3   HUMALOG  MIX 75/25 KWIKPEN (75-25) 100 UNIT/ML KwikPen Inject into the skin. 75-25 pen injectior     hydrALAZINE  (APRESOLINE ) 100 MG tablet Take 1 tablet (100 mg total) by mouth 3 (three) times daily. 270 tablet 3   insulin  lispro (HUMALOG ) 100 UNIT/ML KwikPen Inject into the skin.     Insulin  Pen Needle (B-D ULTRAFINE III SHORT PEN) 31G X 8 MM MISC To use with pen basal and mealtime coverage insulin  pens 5 times daily. DX: E11.65 500 each 1   isosorbide  mononitrate (IMDUR ) 30 MG 24 hr tablet Take 1 tablet (30 mg total) by mouth 2 (two) times daily. 180 tablet 3   mupirocin  ointment (BACTROBAN ) 2 % Apply 1 Application topically daily. To wound on left forearm until healed. 30 g 1   nitroGLYCERIN  (NITROSTAT ) 0.4 MG SL tablet Place 1 tablet (0.4 mg total) under the tongue every 5 (five) minutes as needed for chest pain. 25 tablet 3   nortriptyline  (PAMELOR ) 10 MG capsule Take 50 mg by mouth at bedtime.     oxyCODONE -acetaminophen  (PERCOCET) 7.5-325 MG tablet Take 1 tablet by mouth every 4 (four) hours as needed for severe pain (pain score 7-10) or moderate pain (pain score 4-6). 20 tablet 0   pantoprazole  (PROTONIX ) 40 MG  tablet Take 1 tablet (40 mg total) by mouth 2 (two) times daily before a meal. 180 tablet 3   torsemide  (DEMADEX ) 20 MG tablet TAKE 1 TABLET(20 MG) BY MOUTH TWICE DAILY 180 tablet 2   TOUJEO  MAX SOLOSTAR 300 UNIT/ML Solostar Pen 60 Units daily.     No current facility-administered medications for this visit.    Allergies as of 12/09/2023 - Review Complete 12/09/2023  Allergen Reaction Noted   Iodinated contrast media Other (See Comments) 06/01/2020   Other Other (See Comments) 05/19/2019   Statins Other (See Comments) 05/19/2019   Pregabalin Other (See Comments) 05/31/2019   Sacubitril-valsartan Other (See Comments) 12/02/2019   Atorvastatin Other (See Comments) 05/31/2019   Pravastatin Other (See Comments) 05/31/2019   Rosuvastatin Other (See Comments) 12/09/2012    Family History  Problem Relation Age of Onset   Diabetes Mother    Heart disease Father     Review of Systems:    Constitutional: No weight loss, fever, chills, weakness or fatigue HEENT: Eyes: No change in vision               Ears, Nose, Throat:  No change in hearing or congestion Skin: No rash or itching Cardiovascular: No chest pain, chest pressure or palpitations   Respiratory: No SOB or cough Gastrointestinal: See HPI and otherwise negative Genitourinary: No dysuria or change in urinary frequency Neurological: No headache, dizziness or syncope Musculoskeletal: No new muscle or joint pain Hematologic: No bleeding or bruising Psychiatric: No history of depression or anxiety    Physical Exam:  Vital signs: BP 130/64   Ht 6' 2 (1.88 m)   Wt 188 lb 11.4 oz (85.6 kg)   BMI 24.23 kg/m   Constitutional:   Pleasant male appears to be in NAD, Well developed, Well nourished, alert and cooperative Throat: Oral cavity and pharynx without inflammation, swelling or lesion.  Respiratory: Respirations even and unlabored. Lungs clear to auscultation bilaterally.   No wheezes, crackles, or rhonchi.  Cardiovascular:  Normal S1, S2. Regular rate and rhythm. No peripheral edema, cyanosis or pallor.  Gastrointestinal:  Soft, nondistended, nontender. No rebound or guarding. Normal bowel sounds. No  appreciable masses or hepatomegaly.  Peritoneal dialysis catheter noted to left abdomen. Rectal:  Not performed. Declined Msk:  uses electric wheelchair Neurologic:  Alert and  oriented x4;  grossly normal neurologically.  Skin:   Dry and intact , discoloration noted to abdomen and extremities Psychiatric: Oriented to person, place and time. Demonstrates good judgement and reason without abnormal affect or behaviors.  RELEVANT LABS AND IMAGING: CBC    Latest Ref Rng & Units 12/09/2023    3:02 PM 03/25/2023    4:42 AM 03/24/2023   11:38 AM  CBC  WBC 4.0 - 10.5 K/uL 7.5  9.1  9.3   Hemoglobin 13.0 - 17.0 g/dL 89.5  88.7  89.6   Hematocrit 39.0 - 52.0 % 31.2  32.7  30.3   Platelets 150.0 - 400.0 K/uL 250.0  232  237      CMP     Latest Ref Rng & Units 12/09/2023    3:02 PM 03/25/2023    4:42 AM 03/24/2023   11:38 AM  CMP  Glucose 70 - 99 mg/dL  856  846   BUN 8 - 23 mg/dL  80  79   Creatinine 9.38 - 1.24 mg/dL  4.47  4.15   Sodium 864 - 145 mmol/L  134  132   Potassium 3.5 - 5.1 mmol/L  4.1  6.0   Chloride 98 - 111 mmol/L  100  101   CO2 22 - 32 mmol/L  24  20   Calcium  8.9 - 10.3 mg/dL  8.1  7.9   Total Protein 6.0 - 8.3 g/dL 6.6   6.1   Total Bilirubin 0.2 - 1.2 mg/dL 0.7   0.9   Alkaline Phos 39 - 117 U/L 248   51   AST 0 - 37 U/L 21   18   ALT 0 - 53 U/L 28   17      Lab Results  Component Value Date   TSH 4.006 03/24/2023   Computed MELD 3.0 unavailable. One or more values for this score either were not found within the given timeframe or did not fit some other criterion. Computed MELD-Na unavailable. One or more values for this score either were not found within the given timeframe or did not fit some other criterion.  11/14/2023 labs show : Albumin  2.9, total bili 0.6, AST 24, ALT 34, alk  phos 365, hemoglobin 11.8, platelets 264, CMV IgG positive, rubella IgG positive, EBV VCA IgG Antibody positive, HSV negative, GGT 254, PT 13, INR 1.14, HIV nonreactive, syphilis reactive, hep B surface antibody reactive, hepatitis B surface antibody quant 18.81, hepatitis B surface Ag nonreactive, hep B core total AB nonreactive, hepatitis C nonreactive, hepatitis A IgG nonreactive, HLA antibody screen negative, Toxoplasma Gondii IgG negative  09/2022 colonoscopy   - One 2 mm polyp in the cecum, removed with a cold biopsy forceps. Resected and retrieved.  - Five 3 to 7 mm polyps in the ascending colon, with a cold snare. Resected and retrieved.  - Four 2 to 5 mm polyps in the transverse colon,  removed with a cold snare. Resected and retrieved.  - Non-bleeding internal hemorrhoids. Path:    08/2022 EGD, reclal 3 years  - Esophageal mucosal changes secondary to established  long-segment Barrett's disease. Biopsied. - A small amount of food (residue) in the stomach. - Normal examined duodenum.  Path: DIAGNOSIS:  A. ESOPHAGUS; COLD BIOPSY:  - SQUAMOCOLUMNAR JUNCTIONAL MUCOSA WITH FOCAL INTESTINAL METAPLASIA  CONSISTENT WITH BARRETT'S ESOPHAGUS GIVEN  ENDOSCOPIC FINDINGS.  - NEGATIVE FOR DYSPLASIA AND MALIGNANCY.   Echocardiogram March 25, 2023 EF 35 to 40%, RV mildly reduced function 11/14/23 CT abd/pelvis WO contrast 1. No contour deforming renal masses or calculi.  2. Extensive circumferential atherosclerotic plaque of the abdominal aorta and bilateral common iliac arteries in a symmetric distribution, left slightly worse than the right.  3. Peritoneal dialysis catheter is in place coiled within the pelvis.  4. Moderate volume abdominal pelvic fluid could be related to volume overload and/or in part related to dialysate.  5.. Hepatomegaly measuring 26 cm, diffuse hepatic steatosis.  6. Cholelithiasis with out acute cholecystitis.    Assessment: Encounter Diagnoses  Name Primary?    Hepatic steatosis Yes   Hepatomegaly    Elevated alkaline phosphatase level    Cholecystitis without cholelithiasis    Acute on chronic anemia    Iron deficiency anemia, unspecified iron deficiency anemia type    Melena    Chronic constipation    Lower abdominal pain    History of colonic polyps    Barrett's esophagus with dysplasia     63 year old male patient who presents with hepatic steatosis and elevated alk phosphatase 365. Normal LFTs.  Normal platelets. PT 13, INR 1.14.  6/25 CT scan shows hepatomegaly and diffuse hepatic steatosis and cholelithiasis without acute cholecystitis.  No alcohol use.  Related to hepatitis B patient received vaccinations.  Will go ahead and pursue full liver workup and rule out autoimmune hepatitis, genetic disorders.     Patient also has acute on chronic anemia since April 2022 per patient since he has required peritoneal dialysis with chronic kidney disease.  Patient receives IV iron infusions as needed.  Patient has had upper GI endoscopy and colonoscopy in April and [place] 2024.  Patient does report 2 weeks of dark stools.  Patient declined we doing a digital exam today for sample.  Will send patient home with fecal occult card.  Will also check patient's hemoglobin level today to evaluate for drop.  Patient is on pantoprazole  twice daily for history of GERD with Barrett's.  No NSAID use.  Khadeem Rockett consider upper GI endoscopy pending results.  Patient is due for colon screening colonoscopy for history of tubular adenomas with 2-day prep.  Will schedule once workup complete.    CT scan did show cholelithiasis without acute cholecystitis, patient denies right upper quadrant pain or nausea and vomiting.  We discussed consult with general surgery.  Will revisit at follow-up.  ED cautions given.      Patient has history of chronic constipation that responds well to over-the-counter stool softeners as needed.  We did discuss sacral narcotics can decrease motility in the large  intestine and Mirinda Monte exacerbate his current issues.  Patient has concerns with lower abdominal discomfort that Bayley Hurn or Shaheen Star not be related to new peritoneal dialysis solution.  Recommended the patient follow-up with his nephrologist to see if they would like to do any further testing.  Recent CT scan did not show any indication of his current symptoms.    Patient had upper GI endoscopy for history of Barrett's negative for dysplasia in April 2024 with recommendations to follow-up surveillance screening in 3 years.    Patient had colonoscopy in [place] 2024 with multiple tubular adenomas with recommendation to follow-up in 1 year with 2-day prep.  Patient would like to wait until workup complete to see if endoscopy is needed as well.  Patient will need to have procedures at hospital due to history of  peritoneal dialysis  Plan: -Recheck CBC today  - Check ANA, AMA, Anti-smooth muscle antibody,  ferritin, TIBC,  Alpha 1 antitrypsin, ceruloplasmin, tTG, total IgA, PT/INR, IgG -Continue pantoprazole  40mg  BID -Recommend GERD diet no late meals 3 to 4 hours before lying down -Will check fecal occult, sent home with patient -offered antispasmodic for abd pain- patient declined -continue stool softeners prn  Thank you for the courtesy of this consult. Please call me with any questions or concerns.   Sharayah Renfrow, FNP-C [facility] 12/09/2023, 5:08 PM  Cc: Liana Fish, NP

## 2023-12-09 NOTE — Patient Instructions (Addendum)
 No NSAID's Strict GERD diet, no late meal 3-4 hours before lying down Continue Pantoprazole  40 mg twice daily  Your provider has requested that you go to the basement level for lab work before leaving today. Press B on the elevator. The lab is located at the first door on the left as you exit the elevator.  Follow the instructions on the Hemoccult cards and mail them back to us  when you are finished or you may take them directly to the lab in the basement of the [facility] building. We will call you with the results.   _______________________________________________________  If your blood pressure at your visit was 140/90 or greater, please contact your primary care physician to follow up on this.  _______________________________________________________  If you are age 83 or older, your body mass index should be between 23-30. Your Body mass index is 24.23 kg/m. If this is out of the aforementioned range listed, please consider follow up with your Primary Care Provider.  If you are age 58 or younger, your body mass index should be between 19-25. Your Body mass index is 24.23 kg/m. If this is out of the aformentioned range listed, please consider follow up with your Primary Care Provider.   ________________________________________________________  The [facility] providers would like to encourage you to use MYCHART to communicate with providers for non-urgent requests or questions.  Due to long hold times on the telephone, sending your provider a message by [facility] may be a faster and more efficient way to get a response.  Please allow 48 business hours for a response.  Please remember that this is for non-urgent requests.  _______________________________________________________  Thank you for trusting me with your gastrointestinal care. Deanna May, NP-C

## 2023-12-10 ENCOUNTER — Ambulatory Visit: Admitting: Podiatry

## 2023-12-10 DIAGNOSIS — Z992 Dependence on renal dialysis: Secondary | ICD-10-CM | POA: Diagnosis not present

## 2023-12-10 DIAGNOSIS — N186 End stage renal disease: Secondary | ICD-10-CM | POA: Diagnosis not present

## 2023-12-10 DIAGNOSIS — K080 Exfoliation of teeth due to systemic causes: Secondary | ICD-10-CM | POA: Diagnosis not present

## 2023-12-10 NOTE — Progress Notes (Signed)
 I agree with the assessment and plan as outlined by Ms. May.

## 2023-12-11 DIAGNOSIS — Z992 Dependence on renal dialysis: Secondary | ICD-10-CM | POA: Diagnosis not present

## 2023-12-11 DIAGNOSIS — N186 End stage renal disease: Secondary | ICD-10-CM | POA: Diagnosis not present

## 2023-12-12 DIAGNOSIS — Z992 Dependence on renal dialysis: Secondary | ICD-10-CM | POA: Diagnosis not present

## 2023-12-12 DIAGNOSIS — N186 End stage renal disease: Secondary | ICD-10-CM | POA: Diagnosis not present

## 2023-12-13 DIAGNOSIS — N186 End stage renal disease: Secondary | ICD-10-CM | POA: Diagnosis not present

## 2023-12-13 DIAGNOSIS — Z992 Dependence on renal dialysis: Secondary | ICD-10-CM | POA: Diagnosis not present

## 2023-12-13 LAB — ANTI-NUCLEAR AB-TITER (ANA TITER): ANA Titer 1: 1:40 {titer} — ABNORMAL HIGH

## 2023-12-13 LAB — MITOCHONDRIAL ANTIBODIES: Mitochondrial M2 Ab, IgG: <=20 U (ref <=20.0)

## 2023-12-13 LAB — TISSUE TRANSGLUTAMINASE ABS,IGG,IGA
(tTG) Ab, IgA: <1 U/mL
(tTG) Ab, IgG: <1 U/mL

## 2023-12-13 LAB — ALPHA-1-ANTITRYPSIN: A-1 Antitrypsin, Ser: 212 mg/dL — ABNORMAL HIGH (ref 83–199)

## 2023-12-13 LAB — ANA: Anti Nuclear Antibody (ANA): POSITIVE — AB

## 2023-12-13 LAB — ANTI-SMOOTH MUSCLE ANTIBODY, IGG: Actin (Smooth Muscle) Antibody (IGG): <20 U (ref <20)

## 2023-12-13 LAB — IGG: IgG (Immunoglobin G), Serum: 951 mg/dL (ref 600–1540)

## 2023-12-13 LAB — CERULOPLASMIN: Ceruloplasmin: 16 mg/dL (ref 14–30)

## 2023-12-13 LAB — IGA: Immunoglobulin A: 375 mg/dL — ABNORMAL HIGH (ref 70–320)

## 2023-12-14 DIAGNOSIS — N186 End stage renal disease: Secondary | ICD-10-CM | POA: Diagnosis not present

## 2023-12-14 DIAGNOSIS — Z992 Dependence on renal dialysis: Secondary | ICD-10-CM | POA: Diagnosis not present

## 2023-12-15 DIAGNOSIS — N186 End stage renal disease: Secondary | ICD-10-CM | POA: Diagnosis not present

## 2023-12-15 DIAGNOSIS — Z992 Dependence on renal dialysis: Secondary | ICD-10-CM | POA: Diagnosis not present

## 2023-12-16 ENCOUNTER — Ambulatory Visit: Payer: Self-pay | Admitting: Gastroenterology

## 2023-12-16 DIAGNOSIS — N186 End stage renal disease: Secondary | ICD-10-CM | POA: Diagnosis not present

## 2023-12-16 DIAGNOSIS — Z961 Presence of intraocular lens: Secondary | ICD-10-CM | POA: Diagnosis not present

## 2023-12-16 DIAGNOSIS — H43823 Vitreomacular adhesion, bilateral: Secondary | ICD-10-CM | POA: Diagnosis not present

## 2023-12-16 DIAGNOSIS — H35033 Hypertensive retinopathy, bilateral: Secondary | ICD-10-CM | POA: Diagnosis not present

## 2023-12-16 DIAGNOSIS — Z992 Dependence on renal dialysis: Secondary | ICD-10-CM | POA: Diagnosis not present

## 2023-12-16 DIAGNOSIS — H31093 Other chorioretinal scars, bilateral: Secondary | ICD-10-CM | POA: Diagnosis not present

## 2023-12-16 DIAGNOSIS — E113513 Type 2 diabetes mellitus with proliferative diabetic retinopathy with macular edema, bilateral: Secondary | ICD-10-CM | POA: Diagnosis not present

## 2023-12-17 DIAGNOSIS — Z992 Dependence on renal dialysis: Secondary | ICD-10-CM | POA: Diagnosis not present

## 2023-12-17 DIAGNOSIS — N186 End stage renal disease: Secondary | ICD-10-CM | POA: Diagnosis not present

## 2023-12-18 DIAGNOSIS — Z794 Long term (current) use of insulin: Secondary | ICD-10-CM | POA: Diagnosis not present

## 2023-12-18 DIAGNOSIS — N186 End stage renal disease: Secondary | ICD-10-CM | POA: Diagnosis not present

## 2023-12-18 DIAGNOSIS — Z992 Dependence on renal dialysis: Secondary | ICD-10-CM | POA: Diagnosis not present

## 2023-12-18 DIAGNOSIS — E118 Type 2 diabetes mellitus with unspecified complications: Secondary | ICD-10-CM | POA: Diagnosis not present

## 2023-12-19 DIAGNOSIS — Z992 Dependence on renal dialysis: Secondary | ICD-10-CM | POA: Diagnosis not present

## 2023-12-19 DIAGNOSIS — N186 End stage renal disease: Secondary | ICD-10-CM | POA: Diagnosis not present

## 2023-12-20 DIAGNOSIS — N186 End stage renal disease: Secondary | ICD-10-CM | POA: Diagnosis not present

## 2023-12-20 DIAGNOSIS — Z992 Dependence on renal dialysis: Secondary | ICD-10-CM | POA: Diagnosis not present

## 2023-12-21 DIAGNOSIS — N186 End stage renal disease: Secondary | ICD-10-CM | POA: Diagnosis not present

## 2023-12-21 DIAGNOSIS — Z992 Dependence on renal dialysis: Secondary | ICD-10-CM | POA: Diagnosis not present

## 2023-12-22 ENCOUNTER — Ambulatory Visit: Admitting: Podiatry

## 2023-12-22 DIAGNOSIS — Z992 Dependence on renal dialysis: Secondary | ICD-10-CM | POA: Diagnosis not present

## 2023-12-22 DIAGNOSIS — Z794 Long term (current) use of insulin: Secondary | ICD-10-CM

## 2023-12-22 DIAGNOSIS — E1142 Type 2 diabetes mellitus with diabetic polyneuropathy: Secondary | ICD-10-CM

## 2023-12-22 DIAGNOSIS — M79676 Pain in unspecified toe(s): Secondary | ICD-10-CM

## 2023-12-22 DIAGNOSIS — N186 End stage renal disease: Secondary | ICD-10-CM | POA: Diagnosis not present

## 2023-12-22 DIAGNOSIS — B351 Tinea unguium: Secondary | ICD-10-CM

## 2023-12-22 NOTE — Progress Notes (Signed)
 He presents today for follow-up of his neuropathy bilateral lower extremity.  States that he will soon be placed on a kidney pancreas list.  Continues at home peritoneal dialysis manual.   Denies any cuts or bruises on his feet or legs.  Last A1c was 7.5 states that the numbness in the legs seems to be worsening and feels that they are becoming weaker.  Objective: Vital signs are stable alert oriented x 3 pulses are barely palpable lower extremity are cool to the touch.  Nails are brittle.  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  Protective sensation is lost per Semmes Weinstein monofilament tuning fork and pain to the level of the knee.  Assessment severe diabetic peripheral neuropathy nonambulatory.  Onychomycosis   Plan: Follow-up with me in 3 to 6 months.  Debridement of nails

## 2023-12-23 DIAGNOSIS — Z992 Dependence on renal dialysis: Secondary | ICD-10-CM | POA: Diagnosis not present

## 2023-12-23 DIAGNOSIS — N186 End stage renal disease: Secondary | ICD-10-CM | POA: Diagnosis not present

## 2023-12-24 DIAGNOSIS — Z992 Dependence on renal dialysis: Secondary | ICD-10-CM | POA: Diagnosis not present

## 2023-12-24 DIAGNOSIS — N186 End stage renal disease: Secondary | ICD-10-CM | POA: Diagnosis not present

## 2023-12-25 DIAGNOSIS — Z992 Dependence on renal dialysis: Secondary | ICD-10-CM | POA: Diagnosis not present

## 2023-12-25 DIAGNOSIS — N186 End stage renal disease: Secondary | ICD-10-CM | POA: Diagnosis not present

## 2023-12-26 DIAGNOSIS — Z992 Dependence on renal dialysis: Secondary | ICD-10-CM | POA: Diagnosis not present

## 2023-12-26 DIAGNOSIS — N186 End stage renal disease: Secondary | ICD-10-CM | POA: Diagnosis not present

## 2023-12-27 DIAGNOSIS — N186 End stage renal disease: Secondary | ICD-10-CM | POA: Diagnosis not present

## 2023-12-27 DIAGNOSIS — Z992 Dependence on renal dialysis: Secondary | ICD-10-CM | POA: Diagnosis not present

## 2023-12-28 DIAGNOSIS — N186 End stage renal disease: Secondary | ICD-10-CM | POA: Diagnosis not present

## 2023-12-28 DIAGNOSIS — Z992 Dependence on renal dialysis: Secondary | ICD-10-CM | POA: Diagnosis not present

## 2023-12-29 DIAGNOSIS — N186 End stage renal disease: Secondary | ICD-10-CM | POA: Diagnosis not present

## 2023-12-29 DIAGNOSIS — Z992 Dependence on renal dialysis: Secondary | ICD-10-CM | POA: Diagnosis not present

## 2023-12-30 DIAGNOSIS — D649 Anemia, unspecified: Secondary | ICD-10-CM | POA: Diagnosis not present

## 2023-12-30 DIAGNOSIS — Z992 Dependence on renal dialysis: Secondary | ICD-10-CM | POA: Diagnosis not present

## 2023-12-30 DIAGNOSIS — N250 Renal osteodystrophy: Secondary | ICD-10-CM | POA: Diagnosis not present

## 2023-12-30 DIAGNOSIS — N186 End stage renal disease: Secondary | ICD-10-CM | POA: Diagnosis not present

## 2023-12-31 DIAGNOSIS — N186 End stage renal disease: Secondary | ICD-10-CM | POA: Diagnosis not present

## 2023-12-31 DIAGNOSIS — Z992 Dependence on renal dialysis: Secondary | ICD-10-CM | POA: Diagnosis not present

## 2024-01-01 DIAGNOSIS — Z992 Dependence on renal dialysis: Secondary | ICD-10-CM | POA: Diagnosis not present

## 2024-01-01 DIAGNOSIS — N186 End stage renal disease: Secondary | ICD-10-CM | POA: Diagnosis not present

## 2024-01-02 DIAGNOSIS — Z992 Dependence on renal dialysis: Secondary | ICD-10-CM | POA: Diagnosis not present

## 2024-01-02 DIAGNOSIS — N186 End stage renal disease: Secondary | ICD-10-CM | POA: Diagnosis not present

## 2024-01-03 DIAGNOSIS — N186 End stage renal disease: Secondary | ICD-10-CM | POA: Diagnosis not present

## 2024-01-03 DIAGNOSIS — Z992 Dependence on renal dialysis: Secondary | ICD-10-CM | POA: Diagnosis not present

## 2024-01-04 DIAGNOSIS — Z992 Dependence on renal dialysis: Secondary | ICD-10-CM | POA: Diagnosis not present

## 2024-01-04 DIAGNOSIS — N186 End stage renal disease: Secondary | ICD-10-CM | POA: Diagnosis not present

## 2024-01-05 DIAGNOSIS — N186 End stage renal disease: Secondary | ICD-10-CM | POA: Diagnosis not present

## 2024-01-05 DIAGNOSIS — Z992 Dependence on renal dialysis: Secondary | ICD-10-CM | POA: Diagnosis not present

## 2024-01-06 DIAGNOSIS — Z992 Dependence on renal dialysis: Secondary | ICD-10-CM | POA: Diagnosis not present

## 2024-01-06 DIAGNOSIS — N186 End stage renal disease: Secondary | ICD-10-CM | POA: Diagnosis not present

## 2024-01-07 DIAGNOSIS — Z992 Dependence on renal dialysis: Secondary | ICD-10-CM | POA: Diagnosis not present

## 2024-01-07 DIAGNOSIS — N186 End stage renal disease: Secondary | ICD-10-CM | POA: Diagnosis not present

## 2024-01-08 DIAGNOSIS — N186 End stage renal disease: Secondary | ICD-10-CM | POA: Diagnosis not present

## 2024-01-08 DIAGNOSIS — Z992 Dependence on renal dialysis: Secondary | ICD-10-CM | POA: Diagnosis not present

## 2024-01-09 DIAGNOSIS — Z992 Dependence on renal dialysis: Secondary | ICD-10-CM | POA: Diagnosis not present

## 2024-01-09 DIAGNOSIS — N186 End stage renal disease: Secondary | ICD-10-CM | POA: Diagnosis not present

## 2024-01-10 DIAGNOSIS — Z992 Dependence on renal dialysis: Secondary | ICD-10-CM | POA: Diagnosis not present

## 2024-01-10 DIAGNOSIS — N186 End stage renal disease: Secondary | ICD-10-CM | POA: Diagnosis not present

## 2024-01-11 DIAGNOSIS — N186 End stage renal disease: Secondary | ICD-10-CM | POA: Diagnosis not present

## 2024-01-11 DIAGNOSIS — Z992 Dependence on renal dialysis: Secondary | ICD-10-CM | POA: Diagnosis not present

## 2024-01-12 DIAGNOSIS — N186 End stage renal disease: Secondary | ICD-10-CM | POA: Diagnosis not present

## 2024-01-12 DIAGNOSIS — Z992 Dependence on renal dialysis: Secondary | ICD-10-CM | POA: Diagnosis not present

## 2024-01-13 DIAGNOSIS — Z992 Dependence on renal dialysis: Secondary | ICD-10-CM | POA: Diagnosis not present

## 2024-01-13 DIAGNOSIS — N186 End stage renal disease: Secondary | ICD-10-CM | POA: Diagnosis not present

## 2024-01-14 ENCOUNTER — Other Ambulatory Visit: Payer: Self-pay

## 2024-01-14 DIAGNOSIS — K760 Fatty (change of) liver, not elsewhere classified: Secondary | ICD-10-CM

## 2024-01-14 DIAGNOSIS — Z992 Dependence on renal dialysis: Secondary | ICD-10-CM | POA: Diagnosis not present

## 2024-01-14 DIAGNOSIS — D509 Iron deficiency anemia, unspecified: Secondary | ICD-10-CM

## 2024-01-14 DIAGNOSIS — K5909 Other constipation: Secondary | ICD-10-CM

## 2024-01-14 DIAGNOSIS — N186 End stage renal disease: Secondary | ICD-10-CM | POA: Diagnosis not present

## 2024-01-14 NOTE — Progress Notes (Signed)
 LABS

## 2024-01-15 DIAGNOSIS — Z992 Dependence on renal dialysis: Secondary | ICD-10-CM | POA: Diagnosis not present

## 2024-01-15 DIAGNOSIS — N186 End stage renal disease: Secondary | ICD-10-CM | POA: Diagnosis not present

## 2024-01-16 DIAGNOSIS — Z992 Dependence on renal dialysis: Secondary | ICD-10-CM | POA: Diagnosis not present

## 2024-01-16 DIAGNOSIS — N186 End stage renal disease: Secondary | ICD-10-CM | POA: Diagnosis not present

## 2024-01-17 DIAGNOSIS — N186 End stage renal disease: Secondary | ICD-10-CM | POA: Diagnosis not present

## 2024-01-17 DIAGNOSIS — Z794 Long term (current) use of insulin: Secondary | ICD-10-CM | POA: Diagnosis not present

## 2024-01-17 DIAGNOSIS — E118 Type 2 diabetes mellitus with unspecified complications: Secondary | ICD-10-CM | POA: Diagnosis not present

## 2024-01-17 DIAGNOSIS — Z992 Dependence on renal dialysis: Secondary | ICD-10-CM | POA: Diagnosis not present

## 2024-01-18 DIAGNOSIS — N186 End stage renal disease: Secondary | ICD-10-CM | POA: Diagnosis not present

## 2024-01-18 DIAGNOSIS — Z992 Dependence on renal dialysis: Secondary | ICD-10-CM | POA: Diagnosis not present

## 2024-01-19 ENCOUNTER — Other Ambulatory Visit: Payer: Self-pay | Admitting: Cardiovascular Disease

## 2024-01-19 DIAGNOSIS — I2510 Atherosclerotic heart disease of native coronary artery without angina pectoris: Secondary | ICD-10-CM

## 2024-01-19 DIAGNOSIS — Z951 Presence of aortocoronary bypass graft: Secondary | ICD-10-CM

## 2024-01-19 DIAGNOSIS — I700 Atherosclerosis of aorta: Secondary | ICD-10-CM

## 2024-01-19 DIAGNOSIS — N186 End stage renal disease: Secondary | ICD-10-CM | POA: Diagnosis not present

## 2024-01-19 DIAGNOSIS — Z992 Dependence on renal dialysis: Secondary | ICD-10-CM | POA: Diagnosis not present

## 2024-01-20 DIAGNOSIS — Z992 Dependence on renal dialysis: Secondary | ICD-10-CM | POA: Diagnosis not present

## 2024-01-20 DIAGNOSIS — N186 End stage renal disease: Secondary | ICD-10-CM | POA: Diagnosis not present

## 2024-01-21 DIAGNOSIS — Z992 Dependence on renal dialysis: Secondary | ICD-10-CM | POA: Diagnosis not present

## 2024-01-21 DIAGNOSIS — N186 End stage renal disease: Secondary | ICD-10-CM | POA: Diagnosis not present

## 2024-01-22 DIAGNOSIS — N186 End stage renal disease: Secondary | ICD-10-CM | POA: Diagnosis not present

## 2024-01-22 DIAGNOSIS — Z992 Dependence on renal dialysis: Secondary | ICD-10-CM | POA: Diagnosis not present

## 2024-01-23 DIAGNOSIS — N186 End stage renal disease: Secondary | ICD-10-CM | POA: Diagnosis not present

## 2024-01-23 DIAGNOSIS — Z992 Dependence on renal dialysis: Secondary | ICD-10-CM | POA: Diagnosis not present

## 2024-01-24 DIAGNOSIS — Z992 Dependence on renal dialysis: Secondary | ICD-10-CM | POA: Diagnosis not present

## 2024-01-24 DIAGNOSIS — N186 End stage renal disease: Secondary | ICD-10-CM | POA: Diagnosis not present

## 2024-01-25 DIAGNOSIS — Z992 Dependence on renal dialysis: Secondary | ICD-10-CM | POA: Diagnosis not present

## 2024-01-25 DIAGNOSIS — N186 End stage renal disease: Secondary | ICD-10-CM | POA: Diagnosis not present

## 2024-01-26 DIAGNOSIS — Z992 Dependence on renal dialysis: Secondary | ICD-10-CM | POA: Diagnosis not present

## 2024-01-26 DIAGNOSIS — N186 End stage renal disease: Secondary | ICD-10-CM | POA: Diagnosis not present

## 2024-01-27 ENCOUNTER — Encounter: Payer: Self-pay | Admitting: Nurse Practitioner

## 2024-01-27 ENCOUNTER — Ambulatory Visit: Payer: Medicare Other | Admitting: Nurse Practitioner

## 2024-01-27 VITALS — BP 138/62 | HR 76 | Temp 98.3°F | Resp 16 | Ht 74.0 in | Wt 191.0 lb

## 2024-01-27 DIAGNOSIS — K219 Gastro-esophageal reflux disease without esophagitis: Secondary | ICD-10-CM | POA: Diagnosis not present

## 2024-01-27 DIAGNOSIS — J449 Chronic obstructive pulmonary disease, unspecified: Secondary | ICD-10-CM | POA: Diagnosis not present

## 2024-01-27 DIAGNOSIS — I5022 Chronic systolic (congestive) heart failure: Secondary | ICD-10-CM

## 2024-01-27 DIAGNOSIS — Z0000 Encounter for general adult medical examination without abnormal findings: Secondary | ICD-10-CM | POA: Diagnosis not present

## 2024-01-27 DIAGNOSIS — N186 End stage renal disease: Secondary | ICD-10-CM | POA: Diagnosis not present

## 2024-01-27 DIAGNOSIS — Z992 Dependence on renal dialysis: Secondary | ICD-10-CM | POA: Diagnosis not present

## 2024-01-27 DIAGNOSIS — S31809A Unspecified open wound of unspecified buttock, initial encounter: Secondary | ICD-10-CM

## 2024-01-27 DIAGNOSIS — K5903 Drug induced constipation: Secondary | ICD-10-CM

## 2024-01-27 NOTE — Progress Notes (Signed)
 [facility] [address] [place], [place] [address]  Internal MEDICINE  Office Visit Note  Patient Name: [name] [name]  [id number]  [id number]  Date of Service: 01/27/2024  Chief Complaint  Patient presents with   Diabetes   Gastroesophageal Reflux   Hyperlipidemia   Hypertension   Medicare Wellness    HPI Andrue presents for a medicare annual wellness visit.  Well-appearing 63 y.o. male with diabetes, stage 4 CKD, GERD, COPD, hypertension, chronic systolic CHF, CAD, and degenerative skeletal changes esp in spine.  He ambulates using a motorized scooter and is able to stand and walk a few feet without assistance. He is alert and oriented. He is in good spirits and reports that his CAPD has been going well and his glucose readings are staying low and usually within target range.  Routine CRC screening: due for colonoscopy, wil call [facility]  Eye exam: sees eye doctor regularly, gets injections every 6 weeks  foot exam: has a podiatrist that he sees regularly as well.  Labs: labs are kept UTD by dialysis and his other specialists. New or worsening pain: increasing neuropathy pain in his legs, sees neurology  Other concerns:  PCP: Liana Fish, NP Cardiologist: Evalene Lunger, MD  Nephrologist: Dr. Saralee Stank Endocrinologist: Dr. Donnice Lipps at [facility] Gastroenterologist: Dr. Rogelia Copping Vascular Surgery: Dr. Selinda Gu Podiatrist: Dr. Royden Cedar Dermatologist: Dr. Alm Rhyme Neurologist: Dr. Jannett Fairly; Kaitlin Paich PA-C Eye doctor: sees retina specialist regularly for eye injections.      01/27/2024    1:48 PM 01/22/2023    2:55 PM  MMSE - Mini Mental State Exam  Orientation to time 5 5  Orientation to Place 5 5  Registration 3 3  Attention/ Calculation 5 0  Recall 3 3  Language- name 2 objects 2 2  Language- repeat 1 1  Language- follow 3 step command 3 3  Language- read & follow direction 1 1  Write a sentence 1 0  Copy design  1 1  Total score 30 24    Functional Status Survey: Is the patient deaf or have difficulty hearing?: No Does the patient have difficulty seeing, even when wearing glasses/contacts?: No Does the patient have difficulty concentrating, remembering, or making decisions?: No Does the patient have difficulty walking or climbing stairs?: Yes Does the patient have difficulty dressing or bathing?: No Does the patient have difficulty doing errands alone such as visiting a doctor's office or shopping?: Yes     01/10/2022   12:36 PM 01/16/2022    3:02 PM 07/17/2022    1:17 PM 01/22/2023    2:54 PM 01/27/2024    1:47 PM  Fall Risk  Falls in the past year?  0 0 0 1  Was there an injury with Fall?   0 0 0  Fall Risk Category Calculator   0 0 1  (RETIRED) Patient Fall Risk Level High fall risk       Patient at Risk for Falls Due to   No Fall Risks No Fall Risks   Fall risk Follow up   Falls evaluation completed Falls evaluation completed Falls evaluation completed     Data saved with a previous flowsheet row definition       01/22/2023    2:54 PM  Depression screen PHQ 2/9  Decreased Interest 0  Down, Depressed, Hopeless 0  PHQ - 2 Score 0       05/15/2021   10:41 AM 02/21/2021    2:27  PM  GAD 7 : Generalized Anxiety Score  Nervous, Anxious, on Edge 0 0  Control/stop worrying 0 0  Worry too much - different things 0 0  Trouble relaxing 0 0  Restless 0 0  Easily annoyed or irritable 0 0  Afraid - awful might happen 0 0  Total GAD 7 Score 0 0  Anxiety Difficulty Not difficult at all Not difficult at all      Current Medication: Outpatient Encounter Medications as of 01/27/2024  Medication Sig   albuterol  (VENTOLIN  HFA) 108 (90 Base) MCG/ACT inhaler Inhale 1-2 puffs into the lungs every 6 (six) hours as needed for wheezing or shortness of breath.   amLODipine  (NORVASC ) 10 MG tablet Take 1 tablet (10 mg total) by mouth daily.   aspirin  EC 81 MG tablet Take 81 mg by mouth daily.  Swallow whole.   carvedilol  (COREG ) 3.125 MG tablet Take 1 tablet (3.125 mg total) by mouth 2 (two) times daily with a meal.   cholecalciferol  (VITAMIN D3) 25 MCG (1000 UNIT) tablet Take 2,000 Units by mouth 2 (two) times daily.   cloNIDine (CATAPRES) 0.1 MG tablet Take 1 tablet (0.1 mg total) by mouth 2 (two) times daily as needed (pressure >175).   Evolocumab  (REPATHA  SURECLICK) 140 MG/ML SOAJ INJECT 1 ML UNDER THE SKIN EVERY 14 DAYS   ezetimibe  (ZETIA ) 10 MG tablet Take 1 tablet (10 mg total) by mouth daily.   HUMALOG  MIX 75/25 KWIKPEN (75-25) 100 UNIT/ML KwikPen Inject into the skin. 75-25 pen injectior   hydrALAZINE  (APRESOLINE ) 100 MG tablet Take 1 tablet (100 mg total) by mouth 3 (three) times daily.   insulin  lispro (HUMALOG ) 100 UNIT/ML KwikPen Inject into the skin.   Insulin  Pen Needle (B-D ULTRAFINE III SHORT PEN) 31G X 8 MM MISC To use with pen basal and mealtime coverage insulin  pens 5 times daily. DX: E11.65   isosorbide  mononitrate (IMDUR ) 30 MG 24 hr tablet Take 1 tablet (30 mg total) by mouth 2 (two) times daily.   mupirocin  ointment (BACTROBAN ) 2 % Apply 1 Application topically daily. To wound on left forearm until healed.   nitroGLYCERIN  (NITROSTAT ) 0.4 MG SL tablet Place 1 tablet (0.4 mg total) under the tongue every 5 (five) minutes as needed for chest pain.   nortriptyline  (PAMELOR ) 10 MG capsule Take 50 mg by mouth at bedtime.   pantoprazole  (PROTONIX ) 40 MG tablet Take 1 tablet (40 mg total) by mouth 2 (two) times daily before a meal.   torsemide  (DEMADEX ) 20 MG tablet TAKE 1 TABLET(20 MG) BY MOUTH TWICE DAILY   TOUJEO  MAX SOLOSTAR 300 UNIT/ML Solostar Pen 60 Units daily.   [DISCONTINUED] oxyCODONE -acetaminophen  (PERCOCET) 7.5-325 MG tablet Take 1 tablet by mouth every 4 (four) hours as needed for severe pain (pain score 7-10) or moderate pain (pain score 4-6).   No facility-administered encounter medications on file as of 01/27/2024.    Surgical History: Past Surgical  History:  Procedure Laterality Date   bone graft surgery Bilateral    on both feet x 2   CAPD INSERTION N/A 11/13/2021   Procedure: LAPAROSCOPIC INSERTION CONTINUOUS AMBULATORY PERITONEAL DIALYSIS  (CAPD) CATHETER;  Surgeon: Jordis Laneta FALCON, MD;  Location: [facility] ORS;  Service: General;  Laterality: N/A;   CAPD REVISION N/A 01/10/2022   Procedure: LAPAROSCOPIC REVISION CONTINUOUS AMBULATORY PERITONEAL DIALYSIS  (CAPD) CATHETER;  Surgeon: Jordis Laneta FALCON, MD;  Location: [facility] ORS;  Service: General;  Laterality: N/A;   CARDIAC CATHETERIZATION  2013   S/p PCI  CARDIAC CATHETERIZATION  2018   S/p CABG   CATARACT EXTRACTION Bilateral    COLONOSCOPY     COLONOSCOPY WITH PROPOFOL  N/A 10/17/2022   Procedure: COLONOSCOPY WITH PROPOFOL ;  Surgeon: Jinny Carmine, MD;  Location: [facility] ENDOSCOPY;  Service: Endoscopy;  Laterality: N/A;   CORONARY ARTERY BYPASS GRAFT  2018   (LIMA-LAD,VG-RCA,VG-OM1,VG-D1)   ESOPHAGOGASTRODUODENOSCOPY (EGD) WITH PROPOFOL  N/A 04/04/2020   Procedure: ESOPHAGOGASTRODUODENOSCOPY (EGD) WITH PROPOFOL ;  Surgeon: Jinny Carmine, MD;  Location: [facility] ENDOSCOPY;  Service: Endoscopy;  Laterality: N/A;   ESOPHAGOGASTRODUODENOSCOPY (EGD) WITH PROPOFOL  N/A 06/06/2020   Procedure: ESOPHAGOGASTRODUODENOSCOPY (EGD) WITH PROPOFOL ;  Surgeon: Jinny Carmine, MD;  Location: [facility] ENDOSCOPY;  Service: Endoscopy;  Laterality: N/A;   ESOPHAGOGASTRODUODENOSCOPY (EGD) WITH PROPOFOL  N/A 09/24/2022   Procedure: ESOPHAGOGASTRODUODENOSCOPY (EGD) WITH PROPOFOL ;  Surgeon: Jinny Carmine, MD;  Location: [facility] ENDOSCOPY;  Service: Endoscopy;  Laterality: N/A;   PERIPHERAL VASCULAR CATHETERIZATION Right 10/28/2016   PTA/DEB Right SFA   WRIST SURGERY Left    cyst    Medical History: Past Medical History:  Diagnosis Date   Anemia    Arthritis    CAD (coronary artery disease)    a. 12/2016 s/p CABG x 4 (LIMA->LAD, VG->RCA, VG->OM1, VG->D1); b. 02/2018 MV: EF 35%, small-med inferolaterlal infarct. No ischemia.   CKD  (chronic kidney disease), stage IV [facility])    Peritoneal Dialysis   Colon polyps    COPD (chronic obstructive pulmonary disease) (HCC)    Dupre's syndrome    GERD (gastroesophageal reflux disease)    HFrEF (heart failure reduced ejection fraction) (HCC)    a.) 2018 EF 35%; b.) 02/2018 EF 50%; c). 02/2019 EF 40-45%; d.) 08/2019 Echo: EF 50-55%, no rwma, GrII DD; e.) 04/2020 Echo: EF 30-35%, mod-sev glob HK. Mild LVH. G2DD. Low-nl RV fxn. Mildly dil LA. Mild MR. Mild-mod Ao sclerosis w/o stenosis; f.) TTE 02/09/2021: EF 55%; septal HK, LAE; G2DD.   History of 2019 novel coronavirus disease (COVID-19) 02/08/2021   Hyperlipidemia    Hypertension    Ischemic cardiomyopathy    a.) 02/2018 MV: EF 35%; b.) 08/2019 Echo: EF 50-55%; c.) 04/2020 Echo: EF 30-35%; d.) TTE 02/09/2021: EF 50-55%.   Lymphedema    Migraine    Myocardial infarction [facility])    Neuropathy    PAD (peripheral artery disease) (HCC)    a. 04/2017 Aortoiliac duplex: R iliac dzs; b. 03/2020 LE Duplex: mild bilat atherosclerosis throughout. Patent vessels.   Pneumonia    Retinopathy    S/P CABG x 4    a.)  4v CABG: LIMA->LAD, SVG->RCA, SVG->OM1, SVG->D1   Sleep apnea    a.) does not require nocturnal PAP therapy   Statin intolerance    Stomach ulcer    T2DM (type 2 diabetes mellitus) (HCC)     Family History: Family History  Problem Relation Age of Onset   Diabetes Mother    Heart disease Father     Social History   Socioeconomic History   Marital status: Married    Spouse name: Roxanne   Number of children: Not on file   Years of education: Not on file   Highest education level: Not on file  Occupational History   Not on file  Tobacco Use   Smoking status: Former    Current packs/day: 0.25    Average packs/day: 0.3 packs/day for 39.0 years (9.8 ttl pk-yrs)    Types: Cigarettes    Passive exposure: Past   Smokeless tobacco: Never  Vaping Use   Vaping status: Never Used  Substance and Sexual Activity   Alcohol  use: Not Currently    Comment: very rarely - wine   Drug use: Never   Sexual activity: Not Currently  Other Topics Concern   Not on file  Social History Narrative   Not on file   Social Drivers of Health   Financial Resource Strain: Low Risk  (10/30/2023)   Received from [facility]   Overall Financial Resource Strain (CARDIA)    Difficulty of Paying Living Expenses: Not hard at all  Food Insecurity: No Food Insecurity (10/30/2023)   Received from [facility]   Hunger Vital Sign    Within the past 12 months, you worried that your food would run out before you got the money to buy more.: Never true    Within the past 12 months, the food you bought just didn't last and you didn't have money to get more.: Never true  Transportation Needs: No Transportation Needs (10/30/2023)   Received from [facility] - Transportation    In the past 12 months, has lack of transportation kept you from medical appointments or from getting medications?: No    Lack of Transportation (Non-Medical): No  Physical Activity: Unknown (09/09/2021)   Received from [facility]   Exercise Vital Sign    On average, how many days per week do you engage in moderate to strenuous exercise (like a brisk walk)?: 0 days    Minutes of Exercise per Session: Not on file  Stress: No Stress Concern Present (02/23/2023)   [employer] [facility] [employer] - Occupational Stress Questionnaire    Feeling of Stress : Only a little  Social Connections: Unknown (09/17/2022)   Received from [facility]   Social Network    Social Network: Not on file  Intimate Partner Violence: Not At Risk (03/24/2023)   Humiliation, Afraid, Rape, and Kick questionnaire    Fear of Current or Ex-Partner: No    Emotionally Abused: No    Physically Abused: No    Sexually Abused: No      Review of Systems  Constitutional: Negative.   HENT: Negative.    Eyes: Negative.    Respiratory:  Negative for cough, chest tightness, shortness of breath and wheezing.   Cardiovascular: Negative.  Negative for chest pain and palpitations.  Gastrointestinal:  Positive for constipation (takes stool softener once daily). Negative for abdominal pain, diarrhea, nausea and vomiting.  Endocrine: Negative.   Genitourinary:  Positive for decreased urine volume and difficulty urinating (decreased, on peritoneal dialysis).  Musculoskeletal:  Positive for arthralgias and gait problem.       Using motorized scooter for ambulation  Skin: Negative.   Psychiatric/Behavioral:  Positive for sleep disturbance. Negative for behavioral problems, self-injury and suicidal ideas. The patient is not nervous/anxious.     Vital Signs: BP 138/62 Comment: 167/75  Pulse 76   Temp 98.3 F (36.8 C)   Resp 16   Ht 6' 2 (1.88 m)   Wt 191 lb (86.6 kg)   SpO2 97%   BMI 24.52 kg/m    Physical Exam Vitals reviewed.  Constitutional:      General: He is not in acute distress.    Appearance: Normal appearance. He is not ill-appearing.  HENT:     Head: Normocephalic and atraumatic.     Right Ear: Tympanic membrane, ear canal and external ear normal.     Left Ear: Tympanic membrane, ear canal and external ear normal.  Nose: Nose normal.     Mouth/Throat:     Mouth: Mucous membranes are moist.     Pharynx: Oropharynx is clear.  Eyes:     Extraocular Movements: Extraocular movements intact.     Conjunctiva/sclera: Conjunctivae normal.     Pupils: Pupils are equal, round, and reactive to light.  Cardiovascular:     Rate and Rhythm: Normal rate and regular rhythm.     Pulses: Normal pulses.     Heart sounds: Normal heart sounds. No murmur heard. Pulmonary:     Effort: Pulmonary effort is normal. No respiratory distress.     Breath sounds: Normal breath sounds. No wheezing.  Abdominal:     General: Bowel sounds are normal. There is no distension.     Palpations: Abdomen is soft.      Tenderness: There is no abdominal tenderness.     Comments: CAPD port site, clean and dry  Musculoskeletal:     Cervical back: Normal range of motion and neck supple.     Right lower leg: No edema.     Left lower leg: No edema.  Lymphadenopathy:     Cervical: No cervical adenopathy.  Skin:    General: Skin is warm and dry.     Capillary Refill: Capillary refill takes less than 2 seconds.  Neurological:     Mental Status: He is alert and oriented to person, place, and time.     Sensory: Sensory deficit present.     Motor: Weakness present.     Gait: Gait abnormal (walks short distances and uses motorized scooter for ambulation).  Psychiatric:        Mood and Affect: Mood normal.        Behavior: Behavior normal.        Thought Content: Thought content normal.        Judgment: Judgment normal.        Assessment/Plan: 1. Encounter for subsequent annual wellness visit (AWV) in Medicare patient (Primary) Age-appropriate preventive screenings and vaccinations discussed. Routine labs for health maintenance are up to date. PHM updated.    2. Drug-induced constipation Waiting for a prescription medication to be approved from the dialysis center.   3. Wound of buttock, initial encounter Scabbed over but sore and the wound is near the anus. Patient instructed to keep pressure off the area and apply mupirocin  ointment one to 2 times daily to help the area heal. Also continue zinc  oxide cream as a protective barrier as the wound heals.   4. Chronic systolic heart failure [facility]) Sees cardiology regularly.   5. Chronic obstructive pulmonary disease, unspecified COPD type (HCC) Mild emphysema on prior imaging, does not need maintenance inhaler but does have albuterol  inhaler as needed.   6. Gastroesophageal reflux disease without esophagitis Continue pantoprazole  as       General Counseling: Burech verbalizes understanding of the findings of todays visit and agrees with plan of  treatment. I have discussed any further diagnostic evaluation that may be needed or ordered today. We also reviewed his medications today. he has been encouraged to call the office with any questions or concerns that should arise related to todays visit.    No orders of the defined types were placed in this encounter.   No orders of the defined types were placed in this encounter.   Return in about 6 months (around 07/26/2024) for F/U, Alley Neils PCP.   Total time spent:30 Minutes Time spent includes review of chart, medications, test results, and follow  up plan with the patient.   [place] Controlled Substance Database was reviewed by me.  This patient was seen by Mardy Maxin, FNP-C in collaboration with Dr. Sigrid Bathe as a part of collaborative care agreement.  Lakeia Bradshaw R. Maxin, MSN, FNP-C Internal medicine

## 2024-01-28 ENCOUNTER — Telehealth: Payer: Self-pay | Admitting: Gastroenterology

## 2024-01-28 ENCOUNTER — Encounter: Payer: Self-pay | Admitting: Nurse Practitioner

## 2024-01-28 DIAGNOSIS — K5903 Drug induced constipation: Secondary | ICD-10-CM | POA: Insufficient documentation

## 2024-01-28 DIAGNOSIS — K080 Exfoliation of teeth due to systemic causes: Secondary | ICD-10-CM | POA: Diagnosis not present

## 2024-01-28 DIAGNOSIS — K219 Gastro-esophageal reflux disease without esophagitis: Secondary | ICD-10-CM | POA: Insufficient documentation

## 2024-01-28 DIAGNOSIS — N186 End stage renal disease: Secondary | ICD-10-CM | POA: Diagnosis not present

## 2024-01-28 DIAGNOSIS — Z992 Dependence on renal dialysis: Secondary | ICD-10-CM | POA: Diagnosis not present

## 2024-01-28 NOTE — Telephone Encounter (Signed)
 Inbound call from patient stating he received a call advising him to call and schedule follow up visit. Patient unsure who called. Lab orders were entered 8/20, patient unaware. Patient has been scheduled for 9/30. Patient requesting a call to be advised on labs. Please advise, thank you

## 2024-01-29 ENCOUNTER — Other Ambulatory Visit: Payer: Self-pay

## 2024-01-29 DIAGNOSIS — D509 Iron deficiency anemia, unspecified: Secondary | ICD-10-CM

## 2024-01-29 DIAGNOSIS — Z992 Dependence on renal dialysis: Secondary | ICD-10-CM | POA: Diagnosis not present

## 2024-01-29 DIAGNOSIS — N186 End stage renal disease: Secondary | ICD-10-CM | POA: Diagnosis not present

## 2024-01-29 DIAGNOSIS — K760 Fatty (change of) liver, not elsewhere classified: Secondary | ICD-10-CM

## 2024-01-29 NOTE — Progress Notes (Signed)
 Repeat labs

## 2024-01-30 DIAGNOSIS — N186 End stage renal disease: Secondary | ICD-10-CM | POA: Diagnosis not present

## 2024-01-30 DIAGNOSIS — Z992 Dependence on renal dialysis: Secondary | ICD-10-CM | POA: Diagnosis not present

## 2024-01-31 DIAGNOSIS — Z992 Dependence on renal dialysis: Secondary | ICD-10-CM | POA: Diagnosis not present

## 2024-01-31 DIAGNOSIS — N186 End stage renal disease: Secondary | ICD-10-CM | POA: Diagnosis not present

## 2024-02-01 DIAGNOSIS — N186 End stage renal disease: Secondary | ICD-10-CM | POA: Diagnosis not present

## 2024-02-01 DIAGNOSIS — Z992 Dependence on renal dialysis: Secondary | ICD-10-CM | POA: Diagnosis not present

## 2024-02-02 DIAGNOSIS — N186 End stage renal disease: Secondary | ICD-10-CM | POA: Diagnosis not present

## 2024-02-02 DIAGNOSIS — Z992 Dependence on renal dialysis: Secondary | ICD-10-CM | POA: Diagnosis not present

## 2024-02-02 DIAGNOSIS — D649 Anemia, unspecified: Secondary | ICD-10-CM | POA: Diagnosis not present

## 2024-02-02 DIAGNOSIS — N250 Renal osteodystrophy: Secondary | ICD-10-CM | POA: Diagnosis not present

## 2024-02-03 DIAGNOSIS — N186 End stage renal disease: Secondary | ICD-10-CM | POA: Diagnosis not present

## 2024-02-03 DIAGNOSIS — Z992 Dependence on renal dialysis: Secondary | ICD-10-CM | POA: Diagnosis not present

## 2024-02-03 NOTE — Telephone Encounter (Signed)
 Inbound call from pt requesting to know if he labs were sent over to [place] [facility]. I did advise the patient that based off previous message below his labs were sent over to [facility] in [place]. Please advise.

## 2024-02-04 DIAGNOSIS — Z992 Dependence on renal dialysis: Secondary | ICD-10-CM | POA: Diagnosis not present

## 2024-02-04 DIAGNOSIS — N186 End stage renal disease: Secondary | ICD-10-CM | POA: Diagnosis not present

## 2024-02-05 DIAGNOSIS — N186 End stage renal disease: Secondary | ICD-10-CM | POA: Diagnosis not present

## 2024-02-05 DIAGNOSIS — Z992 Dependence on renal dialysis: Secondary | ICD-10-CM | POA: Diagnosis not present

## 2024-02-06 DIAGNOSIS — Z992 Dependence on renal dialysis: Secondary | ICD-10-CM | POA: Diagnosis not present

## 2024-02-06 DIAGNOSIS — N186 End stage renal disease: Secondary | ICD-10-CM | POA: Diagnosis not present

## 2024-02-07 DIAGNOSIS — N186 End stage renal disease: Secondary | ICD-10-CM | POA: Diagnosis not present

## 2024-02-07 DIAGNOSIS — Z992 Dependence on renal dialysis: Secondary | ICD-10-CM | POA: Diagnosis not present

## 2024-02-08 DIAGNOSIS — N186 End stage renal disease: Secondary | ICD-10-CM | POA: Diagnosis not present

## 2024-02-08 DIAGNOSIS — Z992 Dependence on renal dialysis: Secondary | ICD-10-CM | POA: Diagnosis not present

## 2024-02-09 ENCOUNTER — Telehealth: Payer: Self-pay

## 2024-02-09 ENCOUNTER — Encounter: Payer: Self-pay | Admitting: Nurse Practitioner

## 2024-02-09 ENCOUNTER — Ambulatory Visit (INDEPENDENT_AMBULATORY_CARE_PROVIDER_SITE_OTHER): Admitting: Nurse Practitioner

## 2024-02-09 ENCOUNTER — Telehealth: Payer: Self-pay | Admitting: Nurse Practitioner

## 2024-02-09 VITALS — BP 135/85 | HR 80 | Temp 98.3°F | Resp 16 | Ht 74.0 in | Wt 191.0 lb

## 2024-02-09 DIAGNOSIS — M7021 Olecranon bursitis, right elbow: Secondary | ICD-10-CM | POA: Diagnosis not present

## 2024-02-09 DIAGNOSIS — Z992 Dependence on renal dialysis: Secondary | ICD-10-CM | POA: Diagnosis not present

## 2024-02-09 DIAGNOSIS — N186 End stage renal disease: Secondary | ICD-10-CM | POA: Diagnosis not present

## 2024-02-09 NOTE — Telephone Encounter (Signed)
 As per [name] [name] made appt today

## 2024-02-09 NOTE — Progress Notes (Signed)
 [facility] [address] [place], [place] [address]  Internal MEDICINE  Office Visit Note  Patient Name: [name] [name]  [id number]  [id number]  Date of Service: 02/09/2024  Chief Complaint  Patient presents with   Acute Visit    Cellulitis in elbow Right     HPI [name] [name] presents for an acute sick visit for swollen right elbow --onset was in the past 24 hours and he reports that the swollen fluid-filled area continues to enlarge. He denies any pain in the right elbow. There is no redness or heat. When palpated, it is a fluid filled sac over the bony prominence of the elbow joint.  Patient has a history of septic olecranon bursitis in the left elbow in 2013 for which he was hospitalized for. He is a complex patient with diabetes, ESRD on peritoneal dialysis.  He declined aspiration of the olecranon bursitis today. He reports that 5 doctors at [facility] informed him that this will cause a hole in the sac and it will never heal.     Current Medication:  Outpatient Encounter Medications as of 02/09/2024  Medication Sig   albuterol  (VENTOLIN  HFA) 108 (90 Base) MCG/ACT inhaler Inhale 1-2 puffs into the lungs every 6 (six) hours as needed for wheezing or shortness of breath.   amLODipine  (NORVASC ) 10 MG tablet Take 1 tablet (10 mg total) by mouth daily.   aspirin  EC 81 MG tablet Take 81 mg by mouth daily. Swallow whole.   carvedilol  (COREG ) 3.125 MG tablet Take 1 tablet (3.125 mg total) by mouth 2 (two) times daily with a meal.   cholecalciferol  (VITAMIN D3) 25 MCG (1000 UNIT) tablet Take 2,000 Units by mouth 2 (two) times daily.   cloNIDine (CATAPRES) 0.1 MG tablet Take 1 tablet (0.1 mg total) by mouth 2 (two) times daily as needed (pressure >175).   Evolocumab  (REPATHA  SURECLICK) 140 MG/ML SOAJ INJECT 1 ML UNDER THE SKIN EVERY 14 DAYS   ezetimibe  (ZETIA ) 10 MG tablet Take 1 tablet (10 mg total) by mouth daily.   HUMALOG  MIX 75/25 KWIKPEN (75-25) 100 UNIT/ML KwikPen Inject into  the skin. 75-25 pen injectior   hydrALAZINE  (APRESOLINE ) 100 MG tablet Take 1 tablet (100 mg total) by mouth 3 (three) times daily.   insulin  lispro (HUMALOG ) 100 UNIT/ML KwikPen Inject into the skin.   Insulin  Pen Needle (B-D ULTRAFINE III SHORT PEN) 31G X 8 MM MISC To use with pen basal and mealtime coverage insulin  pens 5 times daily. DX: E11.65   isosorbide  mononitrate (IMDUR ) 30 MG 24 hr tablet Take 1 tablet (30 mg total) by mouth 2 (two) times daily.   mupirocin  ointment (BACTROBAN ) 2 % Apply 1 Application topically daily. To wound on left forearm until healed.   nitroGLYCERIN  (NITROSTAT ) 0.4 MG SL tablet Place 1 tablet (0.4 mg total) under the tongue every 5 (five) minutes as needed for chest pain.   nortriptyline  (PAMELOR ) 10 MG capsule Take 50 mg by mouth at bedtime.   pantoprazole  (PROTONIX ) 40 MG tablet Take 1 tablet (40 mg total) by mouth 2 (two) times daily before a meal.   torsemide  (DEMADEX ) 20 MG tablet TAKE 1 TABLET(20 MG) BY MOUTH TWICE DAILY   TOUJEO  MAX SOLOSTAR 300 UNIT/ML Solostar Pen 60 Units daily.   No facility-administered encounter medications on file as of 02/09/2024.      Medical History: Past Medical History:  Diagnosis Date   Anemia    Arthritis    CAD (coronary artery disease)    a.  12/2016 s/p CABG x 4 (LIMA->LAD, VG->RCA, VG->OM1, VG->D1); b. 02/2018 MV: EF 35%, small-med inferolaterlal infarct. No ischemia.   CKD (chronic kidney disease), stage IV [facility])    Peritoneal Dialysis   Colon polyps    COPD (chronic obstructive pulmonary disease) (HCC)    Dupre's syndrome    GERD (gastroesophageal reflux disease)    HFrEF (heart failure reduced ejection fraction) (HCC)    a.) 2018 EF 35%; b.) 02/2018 EF 50%; c). 02/2019 EF 40-45%; d.) 08/2019 Echo: EF 50-55%, no rwma, GrII DD; e.) 04/2020 Echo: EF 30-35%, mod-sev glob HK. Mild LVH. G2DD. Low-nl RV fxn. Mildly dil LA. Mild MR. Mild-mod Ao sclerosis w/o stenosis; f.) TTE 02/09/2021: EF 55%; septal HK, LAE; G2DD.    History of 2019 novel coronavirus disease (COVID-19) 02/08/2021   Hyperlipidemia    Hypertension    Ischemic cardiomyopathy    a.) 02/2018 MV: EF 35%; b.) 08/2019 Echo: EF 50-55%; c.) 04/2020 Echo: EF 30-35%; d.) TTE 02/09/2021: EF 50-55%.   Lymphedema    Migraine    Myocardial infarction [facility])    Neuropathy    PAD (peripheral artery disease) (HCC)    a. 04/2017 Aortoiliac duplex: R iliac dzs; b. 03/2020 LE Duplex: mild bilat atherosclerosis throughout. Patent vessels.   Pneumonia    Retinopathy    S/P CABG x 4    a.)  4v CABG: LIMA->LAD, SVG->RCA, SVG->OM1, SVG->D1   Sleep apnea    a.) does not require nocturnal PAP therapy   Statin intolerance    Stomach ulcer    T2DM (type 2 diabetes mellitus) (HCC)      Vital Signs: BP 135/85   Pulse 80   Temp 98.3 F (36.8 C)   Resp 16   Ht 6' 2 (1.88 m)   Wt 191 lb (86.6 kg)   SpO2 95%   BMI 24.52 kg/m    Review of Systems  Constitutional:  Negative for chills, fatigue and unexpected weight change.  HENT:  Negative for congestion, postnasal drip, rhinorrhea, sneezing and sore throat.   Eyes:  Negative for redness.  Respiratory: Negative.  Negative for cough, chest tightness, shortness of breath and wheezing.   Cardiovascular: Negative.  Negative for chest pain and palpitations.  Gastrointestinal:  Negative for abdominal pain, constipation, diarrhea, nausea and vomiting.  Genitourinary: Negative.  Negative for dysuria and frequency.  Musculoskeletal:  Positive for gait problem. Negative for arthralgias, back pain, joint swelling and neck pain.       Swollen fluid filled sac on the end of the right elbow.   Skin: Negative.  Negative for rash.  Neurological:  Negative for tremors and numbness.  Hematological:  Negative for adenopathy. Does not bruise/bleed easily.  Psychiatric/Behavioral: Negative.  Negative for behavioral problems (Depression), sleep disturbance and suicidal ideas. The patient is not nervous/anxious.      Physical Exam Vitals reviewed.  Constitutional:      General: He is not in acute distress.    Appearance: Normal appearance. He is normal weight. He is not ill-appearing.  HENT:     Head: Normocephalic and atraumatic.  Eyes:     Pupils: Pupils are equal, round, and reactive to light.  Cardiovascular:     Rate and Rhythm: Normal rate and regular rhythm.  Musculoskeletal:     Right elbow: Swelling (swollen fluid filled sac, no pain or tenderness.) present.  Neurological:     Mental Status: He is alert and oriented to person, place, and time.  Psychiatric:  Mood and Affect: Mood normal.        Behavior: Behavior normal.       Assessment/Plan: 1. Olecranon bursitis of right elbow (Primary) Urgent referral to [facility] surgery.  - Ambulatory referral to Orthopedic Surgery   General Counseling: osiah haring understanding of the findings of todays visit and agrees with plan of treatment. I have discussed any further diagnostic evaluation that may be needed or ordered today. We also reviewed his medications today. he has been encouraged to call the office with any questions or concerns that should arise related to todays visit.    Counseling:    Orders Placed This Encounter  Procedures   Ambulatory referral to Orthopedic Surgery    No orders of the defined types were placed in this encounter.   Return if symptoms worsen or fail to improve, for keep regular scheduled follow up .  [place] Controlled Substance Database was reviewed by me for overdose risk score (ORS)  Time spent:20 Minutes Time spent with patient included reviewing progress notes, labs, imaging studies, and discussing plan for follow up.   This patient was seen by Mardy Maxin, FNP-C in collaboration with Dr. Sigrid Bathe as a part of collaborative care agreement.  Denaya Horn R. Maxin, MSN, FNP-C Internal Medicine

## 2024-02-09 NOTE — Telephone Encounter (Signed)
 Urgent Orthopedic referral sent via [facility] to [facility]. Notified patient. Gave telephone # [phone]

## 2024-02-10 DIAGNOSIS — N186 End stage renal disease: Secondary | ICD-10-CM | POA: Diagnosis not present

## 2024-02-10 DIAGNOSIS — Z794 Long term (current) use of insulin: Secondary | ICD-10-CM | POA: Diagnosis not present

## 2024-02-10 DIAGNOSIS — E118 Type 2 diabetes mellitus with unspecified complications: Secondary | ICD-10-CM | POA: Diagnosis not present

## 2024-02-10 DIAGNOSIS — Z992 Dependence on renal dialysis: Secondary | ICD-10-CM | POA: Diagnosis not present

## 2024-02-11 DIAGNOSIS — N186 End stage renal disease: Secondary | ICD-10-CM | POA: Diagnosis not present

## 2024-02-11 DIAGNOSIS — Z992 Dependence on renal dialysis: Secondary | ICD-10-CM | POA: Diagnosis not present

## 2024-02-12 ENCOUNTER — Telehealth: Payer: Self-pay | Admitting: Nurse Practitioner

## 2024-02-12 DIAGNOSIS — Z992 Dependence on renal dialysis: Secondary | ICD-10-CM | POA: Diagnosis not present

## 2024-02-12 DIAGNOSIS — N186 End stage renal disease: Secondary | ICD-10-CM | POA: Diagnosis not present

## 2024-02-12 NOTE — Telephone Encounter (Signed)
 Received call from patient. [facility] is stating they do not have referral for him. I lvm for Heather who manages the [facility] referral site for their office. Asked her to return my call-Toni

## 2024-02-13 ENCOUNTER — Telehealth: Payer: Self-pay | Admitting: Nurse Practitioner

## 2024-02-13 DIAGNOSIS — N186 End stage renal disease: Secondary | ICD-10-CM | POA: Diagnosis not present

## 2024-02-13 DIAGNOSIS — Z992 Dependence on renal dialysis: Secondary | ICD-10-CM | POA: Diagnosis not present

## 2024-02-13 NOTE — Telephone Encounter (Signed)
 Last office notes, lab results & med list faxed to [facility]; [phone]

## 2024-02-14 DIAGNOSIS — N186 End stage renal disease: Secondary | ICD-10-CM | POA: Diagnosis not present

## 2024-02-14 DIAGNOSIS — Z992 Dependence on renal dialysis: Secondary | ICD-10-CM | POA: Diagnosis not present

## 2024-02-15 DIAGNOSIS — Z992 Dependence on renal dialysis: Secondary | ICD-10-CM | POA: Diagnosis not present

## 2024-02-15 DIAGNOSIS — N186 End stage renal disease: Secondary | ICD-10-CM | POA: Diagnosis not present

## 2024-02-15 NOTE — Progress Notes (Unsigned)
 Cardiology Office Note  Date:  02/16/2024   ID:  [name] [name], DOB [birth year], MRN [id number]  PCP:  Liana Fish, NP   Chief Complaint  Patient presents with   6 month follow up     Doing well.    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), HTN,  HLD,  GERD Former smoker quit 2013 Sleep apnea, unable to tolerate mask Statin intolerance Chronic renal insuff secondary to poorly controlled diabetes Who presents for routine follow-up of his coronary artery disease, history of bypass  LOV December 2024 On PD nightly Also takes torsemide  80/40 milligrams twice a day No PND orthopnea, no lower extremity edema Problems with constipation Uses a scooter to get around  Reports he is thinking about spinal cord stimulator for his chronic pain in his legs Activity limited by neuropathy  Labs in June 26th 2025 A1C 6.8 Total chol 94, LDL 35  Echocardiogram March 25, 2023 EF 35 to 40%, RV mildly reduced function  Stress test April 17, 2023 low risk study Bilateral pleural effusions noted right greater than left  Uses Dexcom monitor to adjust insulin   Echo 9/22 Left ventricular ejection fraction, by estimation, is 50 to 55%.  Prior history of sleep apnea, CPAP and bipap did not work, Working with Dr. Fernand  EKG personally reviewed by myself on todays visit EKG Interpretation Date/Time:  Monday February 16 2024 16:03:05 EDT Ventricular Rate:  87 PR Interval:  180 QRS Duration:  118 QT Interval:  404 QTC Calculation: 486 R Axis:   -16  Text Interpretation: Normal sinus rhythm Minimal voltage criteria for LVH, may be normal variant ( Cornell product ) Inferior infarct (cited on or before 10-Apr-2023) When compared with ECG of 10-Apr-2023 14:02, Abberant conduction is no longer Present Nonspecific T wave abnormality has replaced inverted T waves in  Inferior leads T wave inversion no longer evident in Lateral leads Confirmed by Perla Lye [phone]) on 02/16/2024 4:17:55 PM    Other past medical history reviewed Covid: in the hospital 12/2020 hypoxia, cough, weakness, shortness of breath, COVID-positive, elevated troponin  Echo 04/2020 EF 30 to 35%  Medication intolerances On farxiga/jardiance gets pancreatitis Stopped hydralazine  on his own, intolerance  Echocardiogram 08/2019  normal LV function, no evidence of valve disease or elevated right heart pressures  Outside notes from [place]  heart  initial ejection fraction 35% presumably in 2018 Ejection fraction 50% in October 2019 Notes indicating ejection fraction 40 to 45% October 2020 Lexiscan  Myoview  2019 showing small to moderate inferolateral infarction without ischemia  Notes indicate previously unable to increase Entresto secondary to high potassium level  CABG details from [place]  2018 LIMA to the LAD Vein graft to the RCA Vein graft OM1 Vein graft to D1  Statin myalgias, rheumatoid issue  PMH:   has a past medical history of Anemia, Arthritis, CAD (coronary artery disease), CKD (chronic kidney disease), stage IV (HCC), Colon polyps, COPD (chronic obstructive pulmonary disease) (HCC), Dupre's syndrome, GERD (gastroesophageal reflux disease), HFrEF (heart failure reduced ejection fraction) (HCC), History of 2019 novel coronavirus disease (COVID-19) (02/08/2021), Hyperlipidemia, Hypertension, Ischemic cardiomyopathy, Lymphedema, Migraine, Myocardial infarction (HCC), Neuropathy, PAD (peripheral artery disease), Pneumonia, Retinopathy, S/P CABG x 4, Sleep apnea, Statin intolerance, Stomach ulcer, and T2DM (type 2 diabetes mellitus) (HCC).  PSH:    Past Surgical History:  Procedure Laterality Date   bone graft surgery Bilateral    on  both feet x 2   CAPD INSERTION N/A 11/13/2021   Procedure: LAPAROSCOPIC INSERTION CONTINUOUS AMBULATORY PERITONEAL DIALYSIS   (CAPD) CATHETER;  Surgeon: Jordis Laneta FALCON, MD;  Location: [facility] ORS;  Service: General;  Laterality: N/A;   CAPD REVISION N/A 01/10/2022   Procedure: LAPAROSCOPIC REVISION CONTINUOUS AMBULATORY PERITONEAL DIALYSIS  (CAPD) CATHETER;  Surgeon: Jordis Laneta FALCON, MD;  Location: [facility] ORS;  Service: General;  Laterality: N/A;   CARDIAC CATHETERIZATION  2013   S/p PCI    CARDIAC CATHETERIZATION  2018   S/p CABG   CATARACT EXTRACTION Bilateral    COLONOSCOPY     COLONOSCOPY WITH PROPOFOL  N/A 10/17/2022   Procedure: COLONOSCOPY WITH PROPOFOL ;  Surgeon: Jinny Carmine, MD;  Location: [facility] ENDOSCOPY;  Service: Endoscopy;  Laterality: N/A;   CORONARY ARTERY BYPASS GRAFT  2018   (LIMA-LAD,VG-RCA,VG-OM1,VG-D1)   ESOPHAGOGASTRODUODENOSCOPY (EGD) WITH PROPOFOL  N/A 04/04/2020   Procedure: ESOPHAGOGASTRODUODENOSCOPY (EGD) WITH PROPOFOL ;  Surgeon: Jinny Carmine, MD;  Location: [facility] ENDOSCOPY;  Service: Endoscopy;  Laterality: N/A;   ESOPHAGOGASTRODUODENOSCOPY (EGD) WITH PROPOFOL  N/A 06/06/2020   Procedure: ESOPHAGOGASTRODUODENOSCOPY (EGD) WITH PROPOFOL ;  Surgeon: Jinny Carmine, MD;  Location: [facility] ENDOSCOPY;  Service: Endoscopy;  Laterality: N/A;   ESOPHAGOGASTRODUODENOSCOPY (EGD) WITH PROPOFOL  N/A 09/24/2022   Procedure: ESOPHAGOGASTRODUODENOSCOPY (EGD) WITH PROPOFOL ;  Surgeon: Jinny Carmine, MD;  Location: [facility] ENDOSCOPY;  Service: Endoscopy;  Laterality: N/A;   PERIPHERAL VASCULAR CATHETERIZATION Right 10/28/2016   PTA/DEB Right SFA   WRIST SURGERY Left    cyst    Current Outpatient Medications  Medication Sig Dispense Refill   albuterol  (VENTOLIN  HFA) 108 (90 Base) MCG/ACT inhaler Inhale 1-2 puffs into the lungs every 6 (six) hours as needed for wheezing or shortness of breath. 1 each 3   amLODipine  (NORVASC ) 10 MG tablet Take 1 tablet (10 mg total) by mouth daily. 90 tablet 3   aspirin  EC 81 MG tablet Take 81 mg by mouth daily. Swallow whole.     carvedilol  (COREG ) 3.125 MG tablet Take 1 tablet (3.125 mg  total) by mouth 2 (two) times daily with a meal. 180 tablet 1   cholecalciferol  (VITAMIN D3) 25 MCG (1000 UNIT) tablet Take 2,000 Units by mouth 2 (two) times daily.     cloNIDine  (CATAPRES ) 0.1 MG tablet Take 1 tablet (0.1 mg total) by mouth 2 (two) times daily as needed (pressure >175).     Evolocumab  (REPATHA  SURECLICK) 140 MG/ML SOAJ INJECT 1 ML UNDER THE SKIN EVERY 14 DAYS 6 mL 0   ezetimibe  (ZETIA ) 10 MG tablet Take 1 tablet (10 mg total) by mouth daily. 90 tablet 3   HUMALOG  MIX 75/25 KWIKPEN (75-25) 100 UNIT/ML KwikPen Inject into the skin. 75-25 pen injectior     hydrALAZINE  (APRESOLINE ) 100 MG tablet Take 1 tablet (100 mg total) by mouth 3 (three) times daily. 270 tablet 3   insulin  lispro (HUMALOG ) 100 UNIT/ML KwikPen Inject into the skin.     Insulin  Pen Needle (B-D ULTRAFINE III SHORT PEN) 31G X 8 MM MISC To use with pen basal and mealtime coverage insulin  pens 5 times daily. DX: E11.65 500 each 1   isosorbide  mononitrate (IMDUR ) 30 MG 24 hr tablet Take 1 tablet (30 mg total) by mouth 2 (two) times daily. 180 tablet 3   mupirocin  ointment (BACTROBAN ) 2 % Apply 1 Application topically daily. To wound on left forearm until healed. 30 g 1   nitroGLYCERIN  (NITROSTAT ) 0.4 MG SL tablet Place 1 tablet (0.4 mg total) under the tongue every 5 (five) minutes  as needed for chest pain. 25 tablet 3   nortriptyline  (PAMELOR ) 10 MG capsule Take 50 mg by mouth at bedtime.     pantoprazole  (PROTONIX ) 40 MG tablet Take 1 tablet (40 mg total) by mouth 2 (two) times daily before a meal. 180 tablet 3   torsemide  (DEMADEX ) 20 MG tablet TAKE 1 TABLET(20 MG) BY MOUTH TWICE DAILY 180 tablet 2   TOUJEO  MAX SOLOSTAR 300 UNIT/ML Solostar Pen 60 Units daily.     No current facility-administered medications for this visit.    Allergies:   Iodinated contrast media, Other, Statins, Pregabalin, Sacubitril-valsartan, Atorvastatin, Pravastatin, and Rosuvastatin   Social History:  The patient  reports that he has  quit smoking. His smoking use included cigarettes. He has a 9.8 pack-year smoking history. He has been exposed to tobacco smoke. He has never used smokeless tobacco. He reports that he does not currently use alcohol. He reports that he does not use drugs.   Family History:   family history includes Diabetes in his mother; Heart disease in his father.    Review of Systems: Review of Systems  Constitutional: Negative.   HENT: Negative.    Respiratory: Negative.    Cardiovascular: Negative.   Gastrointestinal: Negative.   Musculoskeletal: Negative.        Leg weakness, sores on legs  Neurological: Negative.   Psychiatric/Behavioral: Negative.    All other systems reviewed and are negative.  PHYSICAL EXAM: VS:  BP (!) 150/70 (BP Location: Left Arm, Patient Position: Sitting, Cuff Size: Normal)   Pulse 87   Ht 6' 1 (1.854 m)   Wt 188 lb (85.3 kg)   SpO2 96%   BMI 24.80 kg/m  , BMI Body mass index is 24.8 kg/m. Constitutional:  oriented to person, place, and time. No distress.  HENT:  Head: Grossly normal Eyes:  no discharge. No scleral icterus.  Neck: No JVD, no carotid bruits  Cardiovascular: Regular rate and rhythm, no murmurs appreciated Pulmonary/Chest: Clear to auscultation bilaterally, no wheezes or rails Abdominal: Soft.  no distension.  no tenderness.  Musculoskeletal: Normal range of motion Neurological:  normal muscle tone. Coordination normal. No atrophy Skin: Skin warm and dry Psychiatric: normal affect, pleasant  Recent Labs: 03/24/2023: Magnesium 2.0; TSH 4.006 03/25/2023: BUN 80; Creatinine, Ser 5.52; Potassium 4.1; Sodium 134 12/09/2023: ALT 28; Hemoglobin 10.4; Platelets 250.0    Lipid Panel Lab Results  Component Value Date   CHOL 201 (H) 12/07/2020   HDL 37 (L) 12/07/2020   LDLCALC 100 (H) 12/07/2020   TRIG 381 (H) 12/07/2020    Wt Readings from Last 3 Encounters:  02/16/24 188 lb (85.3 kg)  02/09/24 191 lb (86.6 kg)  01/27/24 191 lb (86.6 kg)      ASSESSMENT AND PLAN:  Problem List Items Addressed This Visit       Cardiology Problems   Chronic systolic heart failure (HCC)   Relevant Orders   EKG 12-Lead (Completed)   PAD (peripheral artery disease)   Essential hypertension   Relevant Orders   EKG 12-Lead (Completed)   CAD (coronary artery disease) - Primary   Relevant Orders   EKG 12-Lead (Completed)   Aortic atherosclerosis   Relevant Orders   EKG 12-Lead (Completed)   Other Visit Diagnoses       S/P CABG x 4       Relevant Orders   EKG 12-Lead (Completed)     ESRD on peritoneal dialysis (HCC)  End-stage renal disease on dialysis Tolerating peritoneal dialysis, nightly Feels fluid has remained off also on torsemide  80 in the morning 40 in the afternoon  CAD with stable angina Ejection fraction 35 to 40% in the setting of hyperkalemia, bradycardia, fluid overload stress test chest no significant ischemia Continue Zetia  with Repatha  Cholesterol at goal Denies anginal symptoms  Acute on chronic diastolic CHF Appears euvolemic on peritoneal dialysis, torsemide  80/40  Essential hypertension Blood pressure is well controlled on today's visit. No changes made to the medications.  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 Dennise  Hyperlipidemia Continue Zetia  Repatha  every 2 weeks Cholesterol at goal   Signed, Velinda Lunger, M.D., Ph.D. [facility] [place], [place] [phone]

## 2024-02-16 ENCOUNTER — Telehealth: Payer: Self-pay | Admitting: Nurse Practitioner

## 2024-02-16 ENCOUNTER — Encounter: Payer: Self-pay | Admitting: Cardiovascular Disease

## 2024-02-16 ENCOUNTER — Ambulatory Visit: Attending: Cardiovascular Disease | Admitting: Cardiovascular Disease

## 2024-02-16 VITALS — BP 150/70 | HR 87 | Ht 73.0 in | Wt 188.0 lb

## 2024-02-16 DIAGNOSIS — N186 End stage renal disease: Secondary | ICD-10-CM | POA: Diagnosis not present

## 2024-02-16 DIAGNOSIS — Z992 Dependence on renal dialysis: Secondary | ICD-10-CM | POA: Diagnosis not present

## 2024-02-16 DIAGNOSIS — Z951 Presence of aortocoronary bypass graft: Secondary | ICD-10-CM

## 2024-02-16 DIAGNOSIS — I10 Essential (primary) hypertension: Secondary | ICD-10-CM

## 2024-02-16 DIAGNOSIS — I2510 Atherosclerotic heart disease of native coronary artery without angina pectoris: Secondary | ICD-10-CM | POA: Diagnosis not present

## 2024-02-16 DIAGNOSIS — I5022 Chronic systolic (congestive) heart failure: Secondary | ICD-10-CM

## 2024-02-16 DIAGNOSIS — I739 Peripheral vascular disease, unspecified: Secondary | ICD-10-CM

## 2024-02-16 DIAGNOSIS — I700 Atherosclerosis of aorta: Secondary | ICD-10-CM

## 2024-02-16 MED ORDER — ISOSORBIDE MONONITRATE ER 30 MG PO TB24: 30.0000 mg | ORAL_TABLET | Freq: Two times a day (BID) | ORAL | 3 refills | Status: DC

## 2024-02-16 MED ORDER — CLONIDINE HCL 0.1 MG PO TABS: 0.1000 mg | ORAL_TABLET | Freq: Two times a day (BID) | ORAL | 3 refills | Status: DC | PRN

## 2024-02-16 MED ORDER — AMLODIPINE BESYLATE 10 MG PO TABS: 10.0000 mg | ORAL_TABLET | Freq: Every day | ORAL | 3 refills | Status: DC

## 2024-02-16 MED ORDER — NITROGLYCERIN 0.4 MG SL SUBL: 0.4000 mg | SUBLINGUAL_TABLET | SUBLINGUAL | 3 refills | Status: DC | PRN

## 2024-02-16 MED ORDER — CARVEDILOL 3.125 MG PO TABS: 3.1250 mg | ORAL_TABLET | Freq: Two times a day (BID) | ORAL | 3 refills | Status: DC

## 2024-02-16 MED ORDER — REPATHA SURECLICK 140 MG/ML ~~LOC~~ SOAJ: SUBCUTANEOUS | 3 refills | Status: DC

## 2024-02-16 MED ORDER — EZETIMIBE 10 MG PO TABS: 10.0000 mg | ORAL_TABLET | Freq: Every day | ORAL | 3 refills | Status: DC

## 2024-02-16 MED ORDER — TORSEMIDE 20 MG PO TABS: 20.0000 mg | ORAL_TABLET | Freq: Two times a day (BID) | ORAL | 3 refills | Status: DC

## 2024-02-16 MED ORDER — HYDRALAZINE HCL 100 MG PO TABS: 100.0000 mg | ORAL_TABLET | Freq: Three times a day (TID) | ORAL | 3 refills | Status: DC

## 2024-02-16 NOTE — Telephone Encounter (Signed)
 Per patient's request, urgent orthopedic referral redirected to [facility]

## 2024-02-16 NOTE — Patient Instructions (Signed)

## 2024-02-17 DIAGNOSIS — Z992 Dependence on renal dialysis: Secondary | ICD-10-CM | POA: Diagnosis not present

## 2024-02-17 DIAGNOSIS — N186 End stage renal disease: Secondary | ICD-10-CM | POA: Diagnosis not present

## 2024-02-17 NOTE — Telephone Encounter (Signed)
 Inbound call from patient stating that he went to [facility] to try and get his labs drawn, he states that they did not have any orders for him. Patient is requesting we refax the orders. Please advise.

## 2024-02-18 ENCOUNTER — Other Ambulatory Visit: Payer: Self-pay

## 2024-02-18 DIAGNOSIS — M25521 Pain in right elbow: Secondary | ICD-10-CM

## 2024-02-18 DIAGNOSIS — N186 End stage renal disease: Secondary | ICD-10-CM | POA: Diagnosis not present

## 2024-02-18 DIAGNOSIS — Z992 Dependence on renal dialysis: Secondary | ICD-10-CM | POA: Diagnosis not present

## 2024-02-18 NOTE — Telephone Encounter (Signed)
 Spoke with patient and assured him that lab orders were sent to [facility] [place] location. Patient verbalized understanding.

## 2024-02-19 DIAGNOSIS — N186 End stage renal disease: Secondary | ICD-10-CM | POA: Diagnosis not present

## 2024-02-19 DIAGNOSIS — Z992 Dependence on renal dialysis: Secondary | ICD-10-CM | POA: Diagnosis not present

## 2024-02-20 ENCOUNTER — Ambulatory Visit: Admission: RE | Admit: 2024-02-20 | Discharge: 2024-02-20 | Disposition: A | Discharge status: Home / Self Care

## 2024-02-20 ENCOUNTER — Ambulatory Visit (INDEPENDENT_AMBULATORY_CARE_PROVIDER_SITE_OTHER)

## 2024-02-20 ENCOUNTER — Ambulatory Visit
Admission: RE | Admit: 2024-02-20 | Discharge: 2024-02-20 | Disposition: A | Discharge status: Home / Self Care | Source: Ambulatory Visit

## 2024-02-20 DIAGNOSIS — M7989 Other specified soft tissue disorders: Secondary | ICD-10-CM | POA: Diagnosis not present

## 2024-02-20 DIAGNOSIS — M25521 Pain in right elbow: Secondary | ICD-10-CM

## 2024-02-20 DIAGNOSIS — M7021 Olecranon bursitis, right elbow: Secondary | ICD-10-CM | POA: Diagnosis not present

## 2024-02-20 DIAGNOSIS — Z992 Dependence on renal dialysis: Secondary | ICD-10-CM | POA: Diagnosis not present

## 2024-02-20 DIAGNOSIS — N186 End stage renal disease: Secondary | ICD-10-CM | POA: Diagnosis not present

## 2024-02-20 NOTE — Progress Notes (Signed)
 Orthopaedic Surgery New Patient Visit   History of Present Illness: The patient is a 63 y.o. male seen in clinic for 2-week history of right olecranon bursitis.  Patient denies any precipitating injury/trauma.  Denies any new activity that would cause repeated leaning, pressure, or irritation to the right elbow.  Patient states olecranon swelling has been consistent with possible mild enlargement over past 2 days.  Has used compression sleeve with no symptom change.  Denies associated fever, chills, headache, malaise, nausea, vomiting, generalized bodyaches.  Patient denies elbow erythema, streaking redness, open wound, drainage/discharge.  Patient denies decreased right elbow range of motion.  Patient denies radiating pain or new onset/worsening right arm paresthesias/weakness.  Patient reports bilateral arm and leg peripheral neuropathy, no change from baseline.  Patient is currently on dialysis.  Patient states has labs drawn and sees nephrologist every 4 weeks, has been well-controlled.  Patient performs peritoneal dialysis at home 7 days a week.  Patient is also an insulin -dependent diabetic, last A1c 6.6 on 10/31/2023.    Patient with previous history of olecranon bursitis, on the left side.  Treated at [facility].  At that time, had pain, erythema, and desquamation of overlying skin. Patient states was greater than 10 years ago.  Patient states developed sepsis.  Concern for MRSA.  Was diagnosed with Staph aureus.  Was treated with IV antibiotics and hospitalized for 1 week.  Patient never underwent elbow surgery or bursectomy.  Patient seen for current episode of right sided olecranon bursitis by PCP, Mardy Maxin, NP with [facility] on 02/09/24. Declined aspiration at that time. Advised to follow up with orthopedist.  Patient is currently disabled/retired.     Past Medical, Social and Family History: Past Medical History:  Diagnosis Date   Anemia    Arthritis    CAD (coronary  artery disease)    a. 12/2016 s/p CABG x 4 (LIMA->LAD, VG->RCA, VG->OM1, VG->D1); b. 02/2018 MV: EF 35%, small-med inferolaterlal infarct. No ischemia.   CKD (chronic kidney disease), stage IV [facility])    Peritoneal Dialysis   Colon polyps    COPD (chronic obstructive pulmonary disease) (HCC)    Dupre's syndrome    GERD (gastroesophageal reflux disease)    HFrEF (heart failure reduced ejection fraction) (HCC)    a.) 2018 EF 35%; b.) 02/2018 EF 50%; c). 02/2019 EF 40-45%; d.) 08/2019 Echo: EF 50-55%, no rwma, GrII DD; e.) 04/2020 Echo: EF 30-35%, mod-sev glob HK. Mild LVH. G2DD. Low-nl RV fxn. Mildly dil LA. Mild MR. Mild-mod Ao sclerosis w/o stenosis; f.) TTE 02/09/2021: EF 55%; septal HK, LAE; G2DD.   History of 2019 novel coronavirus disease (COVID-19) 02/08/2021   Hyperlipidemia    Hypertension    Ischemic cardiomyopathy    a.) 02/2018 MV: EF 35%; b.) 08/2019 Echo: EF 50-55%; c.) 04/2020 Echo: EF 30-35%; d.) TTE 02/09/2021: EF 50-55%.   Lymphedema    Migraine    Myocardial infarction (HCC)    Neuropathy    PAD (peripheral artery disease)    a. 04/2017 Aortoiliac duplex: R iliac dzs; b. 03/2020 LE Duplex: mild bilat atherosclerosis throughout. Patent vessels.   Pneumonia    Retinopathy    S/P CABG x 4    a.)  4v CABG: LIMA->LAD, SVG->RCA, SVG->OM1, SVG->D1   Sleep apnea    a.) does not require nocturnal PAP therapy   Statin intolerance    Stomach ulcer    T2DM (type 2 diabetes mellitus) (HCC)    Past Surgical History:  Procedure Laterality Date  bone graft surgery Bilateral    on both feet x 2   CAPD INSERTION N/A 11/13/2021   Procedure: LAPAROSCOPIC INSERTION CONTINUOUS AMBULATORY PERITONEAL DIALYSIS  (CAPD) CATHETER;  Surgeon: Jordis Laneta FALCON, MD;  Location: [facility] ORS;  Service: General;  Laterality: N/A;   CAPD REVISION N/A 01/10/2022   Procedure: LAPAROSCOPIC REVISION CONTINUOUS AMBULATORY PERITONEAL DIALYSIS  (CAPD) CATHETER;  Surgeon: Jordis Laneta FALCON, MD;  Location: [facility] ORS;   Service: General;  Laterality: N/A;   CARDIAC CATHETERIZATION  2013   S/p PCI    CARDIAC CATHETERIZATION  2018   S/p CABG   CATARACT EXTRACTION Bilateral    COLONOSCOPY     COLONOSCOPY WITH PROPOFOL  N/A 10/17/2022   Procedure: COLONOSCOPY WITH PROPOFOL ;  Surgeon: Jinny Carmine, MD;  Location: [facility] ENDOSCOPY;  Service: Endoscopy;  Laterality: N/A;   CORONARY ARTERY BYPASS GRAFT  2018   (LIMA-LAD,VG-RCA,VG-OM1,VG-D1)   ESOPHAGOGASTRODUODENOSCOPY (EGD) WITH PROPOFOL  N/A 04/04/2020   Procedure: ESOPHAGOGASTRODUODENOSCOPY (EGD) WITH PROPOFOL ;  Surgeon: Jinny Carmine, MD;  Location: [facility] END[facility];  Service: Endoscopy;  Laterality: N/A;   ESOPHAGOGASTRODUODENOSCOPY (EGD) WITH PROPOFOL  N/A 06/06/2020   Procedure: ESOPHAGOGASTRODUODENOSCOPY (EGD) WITH PROPOFOL ;  Surgeon: Jinny Carmine, MD;  Location: [facility] END[facility];  Service: Endoscopy;  Laterality: N/A;   ESOPHAGOGASTRODUODENOSCOPY (EGD) WITH PROPOFOL  N/A 09/24/2022   Procedure: ESOPHAGOGASTRODUODENOSCOPY (EGD) WITH PROPOFOL ;  Surgeon: Jinny Carmine, MD;  Location: [facility] END[facility];  Service: Endoscopy;  Laterality: N/A;   PERIPHERAL VASCULAR CATHETERIZATION Right 10/28/2016   PTA/DEB Right SFA   WRIST SURGERY Left    cyst   Allergies  Allergen Reactions   Iodinated Contrast Media Other (See Comments)    Kidney disease  Kidney disease  Kidney disease  Kidney disease   Other Other (See Comments)    Pain  With joint stiffness     Statins Other (See Comments)    Pain  With joint stiffness   Pain  With joint stiffness  Other Reaction(s): Joint Pain   Pregabalin Other (See Comments)    Generalized aches and pains Generalized aches and pains Generalized aches and pains Generalized aches and pains Generalized aches and pains   Sacubitril-Valsartan Other (See Comments)    Hyperkalemia  Hyperkalemia Hyperkalemia  Hyperkalemia   Atorvastatin Other (See Comments)    Muscle aches Muscle aches   Pravastatin Other (See Comments)    Muscle  aches Muscle aches   Rosuvastatin Other (See Comments)    Muscle Aches Other reaction(s): JOINT PAIN Other reaction(s): JOINT PAIN Muscle Aches    Current Outpatient Medications on File Prior to Visit  Medication Sig Dispense Refill   albuterol  (VENTOLIN  HFA) 108 (90 Base) MCG/ACT inhaler Inhale 1-2 puffs into the lungs every 6 (six) hours as needed for wheezing or shortness of breath. 1 each 3   amLODipine  (NORVASC ) 10 MG tablet Take 1 tablet (10 mg total) by mouth daily. 90 tablet 3   aspirin  EC 81 MG tablet Take 81 mg by mouth daily. Swallow whole.     carvedilol  (COREG ) 3.125 MG tablet Take 1 tablet (3.125 mg total) by mouth 2 (two) times daily with a meal. 180 tablet 3   cholecalciferol  (VITAMIN D3) 25 MCG (1000 UNIT) tablet Take 2,000 Units by mouth 2 (two) times daily.     cloNIDine  (CATAPRES ) 0.1 MG tablet Take 1 tablet (0.1 mg total) by mouth 2 (two) times daily as needed (pressure >175). 60 tablet 3   Evolocumab  (REPATHA  SURECLICK) 140 MG/ML SOAJ INJECT 1 ML UNDER THE SKIN EVERY 14 DAYS 6 mL 3  ezetimibe  (ZETIA ) 10 MG tablet Take 1 tablet (10 mg total) by mouth daily. 90 tablet 3   HUMALOG  MIX 75/25 KWIKPEN (75-25) 100 UNIT/ML KwikPen Inject into the skin. 75-25 pen injectior     hydrALAZINE  (APRESOLINE ) 100 MG tablet Take 1 tablet (100 mg total) by mouth 3 (three) times daily. 270 tablet 3   insulin  lispro (HUMALOG ) 100 UNIT/ML KwikPen Inject into the skin.     Insulin  Pen Needle (B-D ULTRAFINE III SHORT PEN) 31G X 8 MM MISC To use with pen basal and mealtime coverage insulin  pens 5 times daily. DX: E11.65 500 each 1   isosorbide  mononitrate (IMDUR ) 30 MG 24 hr tablet Take 1 tablet (30 mg total) by mouth 2 (two) times daily. 180 tablet 3   mupirocin  ointment (BACTROBAN ) 2 % Apply 1 Application topically daily. To wound on left forearm until healed. 30 g 1   nitroGLYCERIN  (NITROSTAT ) 0.4 MG SL tablet Place 1 tablet (0.4 mg total) under the tongue every 5 (five) minutes as needed  for chest pain. 25 tablet 3   nortriptyline  (PAMELOR ) 10 MG capsule Take 50 mg by mouth at bedtime.     pantoprazole  (PROTONIX ) 40 MG tablet Take 1 tablet (40 mg total) by mouth 2 (two) times daily before a meal. 180 tablet 3   torsemide  (DEMADEX ) 20 MG tablet Take 1 tablet (20 mg total) by mouth 2 (two) times daily. 180 tablet 3   TOUJEO  MAX SOLOSTAR 300 UNIT/ML Solostar Pen 60 Units daily.     No current facility-administered medications on file prior to visit.   Social History   Tobacco Use   Smoking status: Former    Current packs/day: 0.25    Average packs/day: 0.3 packs/day for 39.0 years (9.8 ttl pk-yrs)    Types: Cigarettes    Passive exposure: Past   Smokeless tobacco: Never  Vaping Use   Vaping status: Never Used  Substance Use Topics   Alcohol use: Not Currently    Comment: very rarely - wine   Drug use: Never      I have reviewed past medical, surgical, social and family history, medications and allergies as documented in the EMR.  Review of Systems - A ROS was performed including pertinent positives and negatives as documented in the HPI.     Physical Exam:  General/Constitutional: NAD Vascular: No edema, swelling or tenderness, except as noted in detailed exam Integumentary: No impressive skin lesions present, except as noted in detailed exam Neuro/Psych: Normal mood and affect, oriented to person, place and time Musculoskeletal: Normal, except as noted in detailed exam and in HPI   Focused examination:  Right elbow inspection reveals moderate edema on extensor surface. Compressible fluid sac over the olecranon. No erythema. No ecchymosis. Skin is intact. No streaking redness. No palpable warmth. No tenderness with palpation over the olecranon bursa, medial and lateral epicondyles. Full ROM; no pain with full flexion or extension.    The patient had 2+ radial and ulnar pulses. The hand and arm were well perfused.  Compartments were soft.   Strength is  5/5 when resisting flexion and extension at the elbow.  Sensation is intact to light touch in median, ulnar, and radial distributions. Patient reports decreased sensation in bilateral 3rd, 4th, and 5th fingers - no change from baseline.   XR Right Elbow Imaging: X-rays of the Right elbow including 3 views obtained today were reviewed personally by me.  Per my independent interpretation these images show no acute fracture or bony  abnormality. The joint spaces are well-aligned with no displacement of the anterior or posterior fat pads to suggest an effusion. The anterior humeral and radiocapitellar lines are normal. Vessel calcification suggestive of atherosclerosis. Visible soft tissue swelling at location of olecranon bursa.   Assessment:  Right olecranon bursitis without septic appearance  Plan:  Patient was seen and examined in office today. We reviewed patient's history, examination, and imaging in detail. Based on information available for this encounter, suspect patient has acute right sided non-septic olecranon bursitis. Patient with two weeks of atraumatic right sided olecranon enlargement. No associated tenderness, erythema, or constitutional symptoms to suggest infectious nature. Discussed with patient he does have underlying risk factors for septic olecranon bursitis given history of left sided olecranon bursitis staph infection, insulin -dependent diabetes, and dialysis status. Also, discussed with patient his increased risk of bursitis due to his dialysis status and associated uremic polyserositis, chronic inflammation, and immune dysfunction. Through shared decision making, agreed on RICE (rest, ice, compression via ACE wrap - as helpful/tolerated, and elevation). Decided against aspiration due to possibility of introducing infection. And decided against anti-inflammatory (NSAID contraindicated due to dialysis and course of oral corticosteroid would increase glucose), blood work (CRP and  LNR), or prophylactic antibiotic at this time.  Advised patient to continue with conservative management and follow-up in clinic in 6 weeks for re-evaluation. Advised patient to contact office immediately if fever, chills, redness, warmth, pain, worsening arm weakness/paresthesias, or other new/concerning symptom.   Patient education material was provided.  All questions, concerns and comments were addressed to the best of my ability.  Follow-up: 6 weeks, sooner with any new symptoms/issues/concerns   Arlyss GEANNIE Schneider, DO Orthopedic Surgery & Sports Medicine [facility]

## 2024-02-20 NOTE — Patient Instructions (Addendum)
 [facility] [address], [place] [address] [phone]   Thank you for visiting the office today. We appreciate your trust and allowing us  to help you with your orthopedic needs.  Please do not hesitate to call if you have further questions or concerns following your visit with us . If you experience life-threatening symptoms or it cannot wait until normal office hours, please go to the nearest Emergency Department for immediate evaluation.

## 2024-02-21 DIAGNOSIS — N186 End stage renal disease: Secondary | ICD-10-CM | POA: Diagnosis not present

## 2024-02-21 DIAGNOSIS — Z992 Dependence on renal dialysis: Secondary | ICD-10-CM | POA: Diagnosis not present

## 2024-02-22 DIAGNOSIS — Z992 Dependence on renal dialysis: Secondary | ICD-10-CM | POA: Diagnosis not present

## 2024-02-22 DIAGNOSIS — N186 End stage renal disease: Secondary | ICD-10-CM | POA: Diagnosis not present

## 2024-02-23 DIAGNOSIS — Z992 Dependence on renal dialysis: Secondary | ICD-10-CM | POA: Diagnosis not present

## 2024-02-23 DIAGNOSIS — N186 End stage renal disease: Secondary | ICD-10-CM | POA: Diagnosis not present

## 2024-02-24 ENCOUNTER — Ambulatory Visit: Admitting: Gastroenterology

## 2024-02-24 DIAGNOSIS — K080 Exfoliation of teeth due to systemic causes: Secondary | ICD-10-CM | POA: Diagnosis not present

## 2024-02-24 DIAGNOSIS — N186 End stage renal disease: Secondary | ICD-10-CM | POA: Diagnosis not present

## 2024-02-24 DIAGNOSIS — Z992 Dependence on renal dialysis: Secondary | ICD-10-CM | POA: Diagnosis not present

## 2024-02-25 DIAGNOSIS — Z992 Dependence on renal dialysis: Secondary | ICD-10-CM | POA: Diagnosis not present

## 2024-02-25 DIAGNOSIS — N186 End stage renal disease: Secondary | ICD-10-CM | POA: Diagnosis not present

## 2024-02-25 DIAGNOSIS — Z23 Encounter for immunization: Secondary | ICD-10-CM | POA: Diagnosis not present

## 2024-02-26 DIAGNOSIS — N186 End stage renal disease: Secondary | ICD-10-CM | POA: Diagnosis not present

## 2024-02-26 DIAGNOSIS — Z992 Dependence on renal dialysis: Secondary | ICD-10-CM | POA: Diagnosis not present

## 2024-02-26 DIAGNOSIS — Z23 Encounter for immunization: Secondary | ICD-10-CM | POA: Diagnosis not present

## 2024-02-26 NOTE — Telephone Encounter (Signed)
 Inbound call from patient stating he is at [facility] in [place] right now and they are stating they don't have the labs in their system for patient. Patient stated if you can fax again to [facility]  Fax number [phone] Please advise  Thank you

## 2024-02-26 NOTE — Telephone Encounter (Signed)
 OLIVA NUMBER [phone] Thank you

## 2024-02-27 DIAGNOSIS — N186 End stage renal disease: Secondary | ICD-10-CM | POA: Diagnosis not present

## 2024-02-27 DIAGNOSIS — Z23 Encounter for immunization: Secondary | ICD-10-CM | POA: Diagnosis not present

## 2024-02-27 DIAGNOSIS — Z992 Dependence on renal dialysis: Secondary | ICD-10-CM | POA: Diagnosis not present

## 2024-02-28 DIAGNOSIS — N186 End stage renal disease: Secondary | ICD-10-CM | POA: Diagnosis not present

## 2024-02-28 DIAGNOSIS — Z23 Encounter for immunization: Secondary | ICD-10-CM | POA: Diagnosis not present

## 2024-02-28 DIAGNOSIS — Z992 Dependence on renal dialysis: Secondary | ICD-10-CM | POA: Diagnosis not present

## 2024-02-29 DIAGNOSIS — Z992 Dependence on renal dialysis: Secondary | ICD-10-CM | POA: Diagnosis not present

## 2024-02-29 DIAGNOSIS — N186 End stage renal disease: Secondary | ICD-10-CM | POA: Diagnosis not present

## 2024-02-29 DIAGNOSIS — Z23 Encounter for immunization: Secondary | ICD-10-CM | POA: Diagnosis not present

## 2024-03-01 DIAGNOSIS — N186 End stage renal disease: Secondary | ICD-10-CM | POA: Diagnosis not present

## 2024-03-01 DIAGNOSIS — Z992 Dependence on renal dialysis: Secondary | ICD-10-CM | POA: Diagnosis not present

## 2024-03-01 DIAGNOSIS — Z23 Encounter for immunization: Secondary | ICD-10-CM | POA: Diagnosis not present

## 2024-03-02 ENCOUNTER — Other Ambulatory Visit: Payer: Self-pay | Admitting: Gastroenterology

## 2024-03-02 DIAGNOSIS — D509 Iron deficiency anemia, unspecified: Secondary | ICD-10-CM | POA: Diagnosis not present

## 2024-03-02 DIAGNOSIS — N250 Renal osteodystrophy: Secondary | ICD-10-CM | POA: Diagnosis not present

## 2024-03-02 DIAGNOSIS — Z992 Dependence on renal dialysis: Secondary | ICD-10-CM | POA: Diagnosis not present

## 2024-03-02 DIAGNOSIS — N186 End stage renal disease: Secondary | ICD-10-CM | POA: Diagnosis not present

## 2024-03-02 DIAGNOSIS — E109 Type 1 diabetes mellitus without complications: Secondary | ICD-10-CM | POA: Diagnosis not present

## 2024-03-02 DIAGNOSIS — K769 Liver disease, unspecified: Secondary | ICD-10-CM | POA: Diagnosis not present

## 2024-03-02 DIAGNOSIS — D649 Anemia, unspecified: Secondary | ICD-10-CM | POA: Diagnosis not present

## 2024-03-02 DIAGNOSIS — Z23 Encounter for immunization: Secondary | ICD-10-CM | POA: Diagnosis not present

## 2024-03-03 DIAGNOSIS — N186 End stage renal disease: Secondary | ICD-10-CM | POA: Diagnosis not present

## 2024-03-03 DIAGNOSIS — Z992 Dependence on renal dialysis: Secondary | ICD-10-CM | POA: Diagnosis not present

## 2024-03-03 DIAGNOSIS — Z23 Encounter for immunization: Secondary | ICD-10-CM | POA: Diagnosis not present

## 2024-03-03 LAB — CBC WITH DIFFERENTIAL/PLATELET
Basophils Absolute: 0.1 x10E3/uL (ref 0.0–0.2)
Basos: 1 %
EOS (ABSOLUTE): 0.2 x10E3/uL (ref 0.0–0.4)
Eos: 3 %
Hematocrit: 30.9 % — ABNORMAL LOW (ref 37.5–51.0)
Hemoglobin: 10.5 g/dL — ABNORMAL LOW (ref 13.0–17.7)
Immature Grans (Abs): 0 x10E3/uL (ref 0.0–0.1)
Immature Granulocytes: 0 %
Lymphocytes Absolute: 1.4 x10E3/uL (ref 0.7–3.1)
Lymphs: 16 %
MCH: 32.6 pg (ref 26.6–33.0)
MCHC: 34 g/dL (ref 31.5–35.7)
MCV: 96 fL (ref 79–97)
Monocytes Absolute: 0.7 x10E3/uL (ref 0.1–0.9)
Monocytes: 8 %
Neutrophils Absolute: 6.3 x10E3/uL (ref 1.4–7.0)
Neutrophils: 72 %
Platelets: 236 x10E3/uL (ref 150–450)
RBC: 3.22 x10E6/uL — ABNORMAL LOW (ref 4.14–5.80)
RDW: 13 % (ref 11.6–15.4)
WBC: 8.7 x10E3/uL (ref 3.4–10.8)

## 2024-03-03 LAB — BASIC METABOLIC PANEL WITH GFR
BUN/Creatinine Ratio: 8 — ABNORMAL LOW (ref 10–24)
BUN: 46 mg/dL — ABNORMAL HIGH (ref 8–27)
CO2: 19 mmol/L — ABNORMAL LOW (ref 20–29)
Calcium: 8.4 mg/dL — ABNORMAL LOW (ref 8.6–10.2)
Chloride: 93 mmol/L — ABNORMAL LOW (ref 96–106)
Creatinine, Ser: 6 mg/dL — ABNORMAL HIGH (ref 0.76–1.27)
Glucose: 74 mg/dL (ref 70–99)
Potassium: 3.8 mmol/L (ref 3.5–5.2)
Sodium: 132 mmol/L — ABNORMAL LOW (ref 134–144)
eGFR: 10 mL/min/1.73 — ABNORMAL LOW (ref >59)

## 2024-03-03 LAB — HEPATIC FUNCTION PANEL
ALT: 16 IU/L (ref 0–44)
AST: 24 IU/L (ref 0–40)
Albumin: 3.4 g/dL — ABNORMAL LOW (ref 3.9–4.9)
Alkaline Phosphatase: 164 IU/L — ABNORMAL HIGH (ref 47–123)
Bilirubin Total: 0.4 mg/dL (ref 0.0–1.2)
Bilirubin, Direct: 0.16 mg/dL (ref 0.00–0.40)
Total Protein: 6.1 g/dL (ref 6.0–8.5)

## 2024-03-03 LAB — PROTIME-INR
INR: 1.1 (ref 0.9–1.2)
Prothrombin Time: 11.8 s (ref 9.1–12.0)

## 2024-03-04 ENCOUNTER — Ambulatory Visit: Admitting: Nurse Practitioner

## 2024-03-04 ENCOUNTER — Encounter: Payer: Self-pay | Admitting: Nurse Practitioner

## 2024-03-04 VITALS — BP 143/70 | HR 72 | Temp 96.6°F | Resp 16 | Ht 73.0 in | Wt 196.0 lb

## 2024-03-04 DIAGNOSIS — J449 Chronic obstructive pulmonary disease, unspecified: Secondary | ICD-10-CM

## 2024-03-04 DIAGNOSIS — K6289 Other specified diseases of anus and rectum: Secondary | ICD-10-CM

## 2024-03-04 DIAGNOSIS — Z1211 Encounter for screening for malignant neoplasm of colon: Secondary | ICD-10-CM

## 2024-03-04 DIAGNOSIS — N186 End stage renal disease: Secondary | ICD-10-CM

## 2024-03-04 DIAGNOSIS — E1122 Type 2 diabetes mellitus with diabetic chronic kidney disease: Secondary | ICD-10-CM

## 2024-03-04 DIAGNOSIS — Z992 Dependence on renal dialysis: Secondary | ICD-10-CM

## 2024-03-04 DIAGNOSIS — Z1212 Encounter for screening for malignant neoplasm of rectum: Secondary | ICD-10-CM

## 2024-03-04 DIAGNOSIS — Z794 Long term (current) use of insulin: Secondary | ICD-10-CM

## 2024-03-04 DIAGNOSIS — I739 Peripheral vascular disease, unspecified: Secondary | ICD-10-CM

## 2024-03-04 DIAGNOSIS — L0231 Cutaneous abscess of buttock: Secondary | ICD-10-CM

## 2024-03-04 DIAGNOSIS — I5022 Chronic systolic (congestive) heart failure: Secondary | ICD-10-CM

## 2024-03-04 DIAGNOSIS — Z23 Encounter for immunization: Secondary | ICD-10-CM | POA: Diagnosis not present

## 2024-03-04 MED ORDER — OXYCODONE HCL 5 MG PO TABS: 5.0000 mg | ORAL_TABLET | Freq: Four times a day (QID) | ORAL | 0 refills | Status: AC | PRN

## 2024-03-04 MED ORDER — CEPHALEXIN 500 MG PO CAPS: 500.0000 mg | ORAL_CAPSULE | Freq: Two times a day (BID) | ORAL | 0 refills | Status: AC

## 2024-03-04 NOTE — Progress Notes (Signed)
 [facility] [address] [place], [place] [address]  Internal MEDICINE  Office Visit Note  Patient Name: [name] [name]  [id number]  [id number]  Date of Service: 03/04/2024  Chief Complaint  Patient presents with   Acute Visit     HPI Demitrious presents for an acute sick visit for  --onset of rectal pain x1 month, worsening now x1 week.         Current Medication:  Outpatient Encounter Medications as of 03/04/2024  Medication Sig   cephALEXin  (KEFLEX ) 500 MG capsule Take 1 capsule (500 mg total) by mouth 2 (two) times daily for 14 days. Take with food   oxyCODONE  (OXY IR/ROXICODONE ) 5 MG immediate release tablet Take 1 tablet (5 mg total) by mouth every 6 (six) hours as needed for up to 5 days for severe pain (pain score 7-10).   albuterol  (VENTOLIN  HFA) 108 (90 Base) MCG/ACT inhaler Inhale 1-2 puffs into the lungs every 6 (six) hours as needed for wheezing or shortness of breath.   amLODipine  (NORVASC ) 10 MG tablet Take 1 tablet (10 mg total) by mouth daily.   aspirin  EC 81 MG tablet Take 81 mg by mouth daily. Swallow whole.   carvedilol  (COREG ) 3.125 MG tablet Take 1 tablet (3.125 mg total) by mouth 2 (two) times daily with a meal.   cholecalciferol  (VITAMIN D3) 25 MCG (1000 UNIT) tablet Take 2,000 Units by mouth 2 (two) times daily.   cloNIDine  (CATAPRES ) 0.1 MG tablet Take 1 tablet (0.1 mg total) by mouth 2 (two) times daily as needed (pressure >175).   Evolocumab  (REPATHA  SURECLICK) 140 MG/ML SOAJ INJECT 1 ML UNDER THE SKIN EVERY 14 DAYS   ezetimibe  (ZETIA ) 10 MG tablet Take 1 tablet (10 mg total) by mouth daily.   HUMALOG  MIX 75/25 KWIKPEN (75-25) 100 UNIT/ML KwikPen Inject into the skin. 75-25 pen injectior   hydrALAZINE  (APRESOLINE ) 100 MG tablet Take 1 tablet (100 mg total) by mouth 3 (three) times daily.   insulin  lispro (HUMALOG ) 100 UNIT/ML KwikPen Inject into the skin.   Insulin  Pen Needle (B-D ULTRAFINE III SHORT PEN) 31G X 8 MM MISC To use with pen  basal and mealtime coverage insulin  pens 5 times daily. DX: E11.65   isosorbide  mononitrate (IMDUR ) 30 MG 24 hr tablet Take 1 tablet (30 mg total) by mouth 2 (two) times daily.   mupirocin  ointment (BACTROBAN ) 2 % Apply 1 Application topically daily. To wound on left forearm until healed.   nitroGLYCERIN  (NITROSTAT ) 0.4 MG SL tablet Place 1 tablet (0.4 mg total) under the tongue every 5 (five) minutes as needed for chest pain.   nortriptyline  (PAMELOR ) 10 MG capsule Take 50 mg by mouth at bedtime.   pantoprazole  (PROTONIX ) 40 MG tablet Take 1 tablet (40 mg total) by mouth 2 (two) times daily before a meal.   torsemide  (DEMADEX ) 20 MG tablet Take 1 tablet (20 mg total) by mouth 2 (two) times daily.   TOUJEO  MAX SOLOSTAR 300 UNIT/ML Solostar Pen 60 Units daily.   No facility-administered encounter medications on file as of 03/04/2024.      Medical History: Past Medical History:  Diagnosis Date   Anemia    Arthritis    CAD (coronary artery disease)    a. 12/2016 s/p CABG x 4 (LIMA->LAD, VG->RCA, VG->OM1, VG->D1); b. 02/2018 MV: EF 35%, small-med inferolaterlal infarct. No ischemia.   CKD (chronic kidney disease), stage IV [facility])    Peritoneal Dialysis   Colon polyps    COPD (chronic obstructive  pulmonary disease) (HCC)    Dupre's syndrome    GERD (gastroesophageal reflux disease)    HFrEF (heart failure reduced ejection fraction) (HCC)    a.) 2018 EF 35%; b.) 02/2018 EF 50%; c). 02/2019 EF 40-45%; d.) 08/2019 Echo: EF 50-55%, no rwma, GrII DD; e.) 04/2020 Echo: EF 30-35%, mod-sev glob HK. Mild LVH. G2DD. Low-nl RV fxn. Mildly dil LA. Mild MR. Mild-mod Ao sclerosis w/o stenosis; f.) TTE 02/09/2021: EF 55%; septal HK, LAE; G2DD.   History of 2019 novel coronavirus disease (COVID-19) 02/08/2021   Hyperlipidemia    Hypertension    Ischemic cardiomyopathy    a.) 02/2018 MV: EF 35%; b.) 08/2019 Echo: EF 50-55%; c.) 04/2020 Echo: EF 30-35%; d.) TTE 02/09/2021: EF 50-55%.   Lymphedema    Migraine     Myocardial infarction (HCC)    Neuropathy    PAD (peripheral artery disease)    a. 04/2017 Aortoiliac duplex: R iliac dzs; b. 03/2020 LE Duplex: mild bilat atherosclerosis throughout. Patent vessels.   Pneumonia    Retinopathy    S/P CABG x 4    a.)  4v CABG: LIMA->LAD, SVG->RCA, SVG->OM1, SVG->D1   Sleep apnea    a.) does not require nocturnal PAP therapy   Statin intolerance    Stomach ulcer    T2DM (type 2 diabetes mellitus) (HCC)      Vital Signs: BP (!) 143/70   Pulse 72   Temp (!) 96.6 F (35.9 C)   Resp 16   Ht 6' 1 (1.854 m)   Wt 196 lb (88.9 kg)   SpO2 98%   BMI 25.86 kg/m    Review of Systems  Constitutional:  Negative for fatigue.  Respiratory: Negative.  Negative for cough, chest tightness, shortness of breath and wheezing.   Cardiovascular: Negative.  Negative for chest pain and palpitations.  Gastrointestinal:  Positive for rectal pain (with difficulty sitting due to the painful swolling area). Negative for abdominal pain, constipation and diarrhea.    Physical Exam Vitals reviewed.  Constitutional:      General: He is not in acute distress.    Appearance: Normal appearance. He is not ill-appearing.  HENT:     Head: Normocephalic and atraumatic.  Eyes:     Pupils: Pupils are equal, round, and reactive to light.  Cardiovascular:     Rate and Rhythm: Normal rate and regular rhythm.  Pulmonary:     Effort: Pulmonary effort is normal. No respiratory distress.  Genitourinary:    Rectum: Mass (near rectum) present.      Comments: Marked area is the abscess, it is firm, no fluctuation, tender and painful. There is erythema and swelling, no drainage. Neurological:     Mental Status: He is alert and oriented to person, place, and time.  Psychiatric:        Mood and Affect: Mood normal.        Behavior: Behavior normal.       Assessment/Plan: 1. Abscess of left buttock (Primary) Antibiotic prescribed, take until gone. Also referred to general  surgery for incision and drainage.  - cephALEXin  (KEFLEX ) 500 MG capsule; Take 1 capsule (500 mg total) by mouth 2 (two) times daily for 14 days. Take with food  Dispense: 28 capsule; Refill: 0 - Ambulatory referral to General Surgery  2. Rectal pain Pain medication ordered to help with pain from the abscess  - oxyCODONE  (OXY IR/ROXICODONE ) 5 MG immediate release tablet; Take 1 tablet (5 mg total) by mouth every 6 (six) hours  as needed for up to 5 days for severe pain (pain score 7-10).  Dispense: 20 tablet; Refill: 0  3. Type 2 diabetes mellitus with chronic kidney disease on chronic dialysis, with long-term current use of insulin  (HCC) A1c updated from endocrinology records.  - Hemoglobin A1c  4. Screening for colorectal cancer Referred to GI for repeat colonoscopy - Ambulatory referral to Gastroenterology   General Counseling: trevian hayashida understanding of the findings of todays visit and agrees with plan of treatment. I have discussed any further diagnostic evaluation that may be needed or ordered today. We also reviewed his medications today. he has been encouraged to call the office with any questions or concerns that should arise related to todays visit.    Counseling:    Orders Placed This Encounter  Procedures   Ambulatory referral to General Surgery    Meds ordered this encounter  Medications   cephALEXin  (KEFLEX ) 500 MG capsule    Sig: Take 1 capsule (500 mg total) by mouth 2 (two) times daily for 14 days. Take with food    Dispense:  28 capsule    Refill:  0    Fill new script today asap.   oxyCODONE  (OXY IR/ROXICODONE ) 5 MG immediate release tablet    Sig: Take 1 tablet (5 mg total) by mouth every 6 (six) hours as needed for up to 5 days for severe pain (pain score 7-10).    Dispense:  20 tablet    Refill:  0    Fill new script today    Return if symptoms worsen or fail to improve.  [place] Controlled Substance Database was reviewed by me for overdose risk  score (ORS)  Time spent:30 Minutes Time spent with patient included reviewing progress notes, labs, imaging studies, and discussing plan for follow up.   This patient was seen by Mardy Maxin, FNP-C in collaboration with Dr. Sigrid Bathe as a part of collaborative care agreement.  Stellah Donovan R. Maxin, MSN, FNP-C Internal Medicine

## 2024-03-05 DIAGNOSIS — Z23 Encounter for immunization: Secondary | ICD-10-CM | POA: Diagnosis not present

## 2024-03-05 DIAGNOSIS — Z992 Dependence on renal dialysis: Secondary | ICD-10-CM | POA: Diagnosis not present

## 2024-03-05 DIAGNOSIS — N186 End stage renal disease: Secondary | ICD-10-CM | POA: Diagnosis not present

## 2024-03-05 LAB — HEMOGLOBIN A1C: Hemoglobin A1C: 6.6

## 2024-03-06 DIAGNOSIS — Z992 Dependence on renal dialysis: Secondary | ICD-10-CM | POA: Diagnosis not present

## 2024-03-06 DIAGNOSIS — N186 End stage renal disease: Secondary | ICD-10-CM | POA: Diagnosis not present

## 2024-03-06 DIAGNOSIS — Z23 Encounter for immunization: Secondary | ICD-10-CM | POA: Diagnosis not present

## 2024-03-07 DIAGNOSIS — Z992 Dependence on renal dialysis: Secondary | ICD-10-CM | POA: Diagnosis not present

## 2024-03-07 DIAGNOSIS — N186 End stage renal disease: Secondary | ICD-10-CM | POA: Diagnosis not present

## 2024-03-07 DIAGNOSIS — Z23 Encounter for immunization: Secondary | ICD-10-CM | POA: Diagnosis not present

## 2024-03-08 DIAGNOSIS — N186 End stage renal disease: Secondary | ICD-10-CM | POA: Diagnosis not present

## 2024-03-08 DIAGNOSIS — Z23 Encounter for immunization: Secondary | ICD-10-CM | POA: Diagnosis not present

## 2024-03-08 DIAGNOSIS — Z992 Dependence on renal dialysis: Secondary | ICD-10-CM | POA: Diagnosis not present

## 2024-03-09 DIAGNOSIS — Z992 Dependence on renal dialysis: Secondary | ICD-10-CM | POA: Diagnosis not present

## 2024-03-09 DIAGNOSIS — N186 End stage renal disease: Secondary | ICD-10-CM | POA: Diagnosis not present

## 2024-03-09 DIAGNOSIS — Z23 Encounter for immunization: Secondary | ICD-10-CM | POA: Diagnosis not present

## 2024-03-10 ENCOUNTER — Ambulatory Visit: Payer: Self-pay | Admitting: Gastroenterology

## 2024-03-10 DIAGNOSIS — Z992 Dependence on renal dialysis: Secondary | ICD-10-CM | POA: Diagnosis not present

## 2024-03-10 DIAGNOSIS — Z23 Encounter for immunization: Secondary | ICD-10-CM | POA: Diagnosis not present

## 2024-03-10 DIAGNOSIS — N186 End stage renal disease: Secondary | ICD-10-CM | POA: Diagnosis not present

## 2024-03-11 ENCOUNTER — Encounter: Payer: Self-pay | Admitting: General Surgery

## 2024-03-11 ENCOUNTER — Ambulatory Visit (INDEPENDENT_AMBULATORY_CARE_PROVIDER_SITE_OTHER): Admitting: General Surgery

## 2024-03-11 VITALS — BP 154/84 | HR 80 | Temp 98.1°F | Ht 74.0 in | Wt 187.0 lb

## 2024-03-11 DIAGNOSIS — E118 Type 2 diabetes mellitus with unspecified complications: Secondary | ICD-10-CM | POA: Diagnosis not present

## 2024-03-11 DIAGNOSIS — N186 End stage renal disease: Secondary | ICD-10-CM | POA: Diagnosis not present

## 2024-03-11 DIAGNOSIS — Z23 Encounter for immunization: Secondary | ICD-10-CM | POA: Diagnosis not present

## 2024-03-11 DIAGNOSIS — K611 Rectal abscess: Secondary | ICD-10-CM

## 2024-03-11 DIAGNOSIS — Z992 Dependence on renal dialysis: Secondary | ICD-10-CM | POA: Diagnosis not present

## 2024-03-11 DIAGNOSIS — Z794 Long term (current) use of insulin: Secondary | ICD-10-CM | POA: Diagnosis not present

## 2024-03-11 NOTE — Patient Instructions (Signed)
 Wound Packing Wound packing usually involves placing a moistened packing material into your wound and then covering it with an outer bandage (dressing). This helps support the healing of deep tissue and tissue under the skin. It also helps prevent bleeding, infection, and further injury. Wounds are packed until deep tissues heal. The time it takes for this to happen is different for everyone. Your health care provider will show you how to pack and dress your wound. Using gloves and a clean technique is important to avoid spreading germs into your wound. Supplies needed: Soap and water. Disposable gloves. Cleansing or wetting solution, such as saline, germ-free (sterile) water, or an antiseptic solution. Clean bowl. Clean packing material, such as gauze, gauze sponges, or rolled gauze. Clean paper towels. Outer dressing. This includes the cover dressing and tape, or a dressing with an adhesive border. Cotton-tipped swabs. Small plastic bag for trash. How to pack your wound Follow your health care provider's instructions on how often you need to change dressings and pack your wound. You will likely be asked to change your dressings 1 to 2 times a day. Preparing to change the wound packing If needed, take pain medicine 30 minutes before you pack your wound as told by your health care provider. Preparing the new packing material  Clean and disinfect your work surface or countertop. Set a plastic bag on or near your work surface. Wash your hands with soap and water for at least 20 seconds before you change the dressing. If soap and water are not available, use hand sanitizer. Put a clean paper towel on the counter. Put a clean bowl on the towel. Only touch the outside of the bowl when handling it. Pour the cleansing or wetting solution that your health care provider tells you to use into the bowl. Select and cut your packing material to fit the size of your wound. Avoid using multiple pieces of  packing material. Drop it into the bowl. Cut tape strips that you will use to seal the outer dressing, if needed. Put gauze pads for cleansing and cotton-tipped swabs on the clean paper towel. Removing the old packing material and dressing Put on a set of gloves. Gently remove the old dressing and packing material. Make sure to check how the drainage looks or if there is any odor. Clean or rinse (irrigate) the wound. Remove your gloves. Put the removed items, including gloves, into the plastic bag to throw away later. Wash your hands again with soap and water for at least 20 seconds. If soap and water are not available, use hand sanitizer. Applying the new packing material and dressing  Put on a new set of gloves. Squeeze the packing material in the bowl to release the extra liquid. The packing material should be moist, but not dripping wet. Gently place the packing material into the wound. Use a cotton-tipped swab to guide it into place, filling all of the space. Do not overpack the wound bed. Dry your gloved fingertips on the paper towel. Open up your outer dressing supplies and put them on a dry part of the paper towel. Keep them from getting wet. Place the outer dressing over the packed wound. Tape the edges of the outer dressing in place. Remove your gloves. Wash your hands again with soap and water for at least 20 seconds. If soap and water are not available, use hand sanitizer. Put the removed items, including gloves, into the plastic bag to throw away. Clean and disinfect your work  surface or countertop. General tips Follow your health care provider's instructions on how much to pack the wound. At first, you may need to pack it more fully to help stop bleeding. As the wound begins to heal inside, you will use less packing material and pack the wound loosely to allow the tissue to heal slowly from the inside out. Do not take baths, swim, or use a hot tub until your health care  provider approves. Ask your health care provider if you may take showers. You may only be allowed to take sponge baths. Keep the dressing clean and dry. Follow any other instructions given by your health care provider on how to aid healing. This may include applying warm or cold compresses, raising (elevating) the affected area, or wearing a compression dressing. Check your wound site every day for signs of infection. Check for: More redness, swelling, or pain. More fluid or blood. Warmth or hardness (induration). Pus or a bad smell. Protect your wound from the sun when you are outside for the first 6 months, or for as long as told by your health care provider. Cover up the scar area or apply sunscreen that has an SPF of at least 30. Keep all follow-up visits. This is important. Contact a health care provider if: Your pain is not controlled with pain medicine. You have more drainage, redness, swelling, or pain at your wound site. You have new rash, warmth, or induration around the wound. You have a fever or chills. Your wound becomes larger or deeper. Get help right away if: The tissue inside your wound changes color from pink to white, yellow, or black. You notice a bad smell or pus coming from the wound site. You are having trouble packing your wound. Your wound is bleeding, and the bleeding does not stop with gentle pressure. These symptoms may represent a serious problem that is an emergency. Do not wait to see if the symptoms will go away. Get medical help right away. Call your local emergency services (911 in the [place] Do not drive yourself to the hospital. Summary Wound packing usually involves placing a moistened packing material into your wound and then covering it with an outer bandage (dressing). Follow your health care provider's instructions on how often you need to change dressings and pack your wound. You will likely be asked to change dressings 1 to 2 times a day. When  packing your wound, it is important to use gloves to avoid spreading germs into the wound. Check your wound site every day for signs of infection. This information is not intended to replace advice given to you by your health care provider. Make sure you discuss any questions you have with your health care provider. Document Revised: 09/19/2020 Document Reviewed: 09/19/2020 Elsevier Patient Education  2024 [employer].

## 2024-03-11 NOTE — Progress Notes (Signed)
 Patient ID: [name] [name], male   DOB: 1960-09-02, 63 y.o.   MRN: [id number] CC: Perirectal abscess History of Present Illness [name] [name] is a 63 y.o. male with past medical history significant for chronic kidney disease on peritoneal dialysis, coronary artery disease on aspirin  who presents in consultation for perirectal abscess.  The patient reports that approximately a month ago he noticed a swelling in his buttock.  He said that this was painful to touch and increased in size.  He says that today it actually started draining a small amount of pus.  He denies any purulence or mucus drainage from his bottom.  He does report that once he started dialysis recently has had constipation but has started to have more normal stools now. \  His last colonoscopy was done in May 2024.  These were consistent with multiple polyps.  Past Medical History Past Medical History:  Diagnosis Date   Anemia    Arthritis    CAD (coronary artery disease)    a. 12/2016 s/p CABG x 4 (LIMA->LAD, VG->RCA, VG->OM1, VG->D1); b. 02/2018 MV: EF 35%, small-med inferolaterlal infarct. No ischemia.   CKD (chronic kidney disease), stage IV [facility])    Peritoneal Dialysis   Colon polyps    COPD (chronic obstructive pulmonary disease) (HCC)    Dupre's syndrome    GERD (gastroesophageal reflux disease)    HFrEF (heart failure reduced ejection fraction) (HCC)    a.) 2018 EF 35%; b.) 02/2018 EF 50%; c). 02/2019 EF 40-45%; d.) 08/2019 Echo: EF 50-55%, no rwma, GrII DD; e.) 04/2020 Echo: EF 30-35%, mod-sev glob HK. Mild LVH. G2DD. Low-nl RV fxn. Mildly dil LA. Mild MR. Mild-mod Ao sclerosis w/o stenosis; f.) TTE 02/09/2021: EF 55%; septal HK, LAE; G2DD.   History of 2019 novel coronavirus disease (COVID-19) 02/08/2021   Hyperlipidemia    Hypertension    Ischemic cardiomyopathy    a.) 02/2018 MV: EF 35%; b.) 08/2019 Echo: EF 50-55%; c.) 04/2020 Echo: EF 30-35%; d.) TTE 02/09/2021: EF 50-55%.   Lymphedema    Migraine     Myocardial infarction (HCC)    Neuropathy    PAD (peripheral artery disease)    a. 04/2017 Aortoiliac duplex: R iliac dzs; b. 03/2020 LE Duplex: mild bilat atherosclerosis throughout. Patent vessels.   Pneumonia    Retinopathy    S/P CABG x 4    a.)  4v CABG: LIMA->LAD, SVG->RCA, SVG->OM1, SVG->D1   Sleep apnea    a.) does not require nocturnal PAP therapy   Statin intolerance    Stomach ulcer    T2DM (type 2 diabetes mellitus) (HCC)        Past Surgical History:  Procedure Laterality Date   bone graft surgery Bilateral    on both feet x 2   CAPD INSERTION N/A 11/13/2021   Procedure: LAPAROSCOPIC INSERTION CONTINUOUS AMBULATORY PERITONEAL DIALYSIS  (CAPD) CATHETER;  Surgeon: Jordis Laneta FALCON, MD;  Location: [facility];  Service: General;  Laterality: N/A;   CAPD REVISION N/A 01/10/2022   Procedure: LAPAROSCOPIC REVISION CONTINUOUS AMBULATORY PERITONEAL DIALYSIS  (CAPD) CATHETER;  Surgeon: Jordis Laneta FALCON, MD;  Location: [facility];  Service: General;  Laterality: N/A;   CARDIAC CATHETERIZATION  2013   S/p PCI    CARDIAC CATHETERIZATION  2018   S/p CABG   CATARACT EXTRACTION Bilateral    COLONOSCOPY     COLONOSCOPY WITH PROPOFOL  N/A 10/17/2022   Procedure: COLONOSCOPY WITH PROPOFOL ;  Surgeon: Jinny Carmine, MD;  Location: [facility];  Service: Endoscopy;  Laterality: N/A;   CORONARY ARTERY BYPASS GRAFT  2018   (LIMA-LAD,VG-RCA,VG-OM1,VG-D1)   ESOPHAGOGASTRODUODENOSCOPY (EGD) WITH PROPOFOL  N/A 04/04/2020   Procedure: ESOPHAGOGASTRODUODENOSCOPY (EGD) WITH PROPOFOL ;  Surgeon: Jinny Carmine, MD;  Location: [facility];  Service: Endoscopy;  Laterality: N/A;   ESOPHAGOGASTRODUODENOSCOPY (EGD) WITH PROPOFOL  N/A 06/06/2020   Procedure: ESOPHAGOGASTRODUODENOSCOPY (EGD) WITH PROPOFOL ;  Surgeon: Jinny Carmine, MD;  Location: [facility];  Service: Endoscopy;  Laterality: N/A;   ESOPHAGOGASTRODUODENOSCOPY (EGD) WITH PROPOFOL  N/A 09/24/2022   Procedure: ESOPHAGOGASTRODUODENOSCOPY (EGD) WITH  PROPOFOL ;  Surgeon: Jinny Carmine, MD;  Location: [facility];  Service: Endoscopy;  Laterality: N/A;   PERIPHERAL VASCULAR CATHETERIZATION Right 10/28/2016   PTA/DEB Right SFA   WRIST SURGERY Left    cyst    Allergies  Allergen Reactions   Iodinated Contrast Media Other (See Comments)    Kidney disease  Kidney disease  Kidney disease  Kidney disease   Other Other (See Comments)    Pain  With joint stiffness     Statins Other (See Comments)    Pain  With joint stiffness   Pain  With joint stiffness  Other Reaction(s): Joint Pain   Pregabalin Other (See Comments)    Generalized aches and pains Generalized aches and pains Generalized aches and pains Generalized aches and pains Generalized aches and pains   Sacubitril-Valsartan Other (See Comments)    Hyperkalemia  Hyperkalemia Hyperkalemia  Hyperkalemia   Atorvastatin Other (See Comments)    Muscle aches Muscle aches   Pravastatin Other (See Comments)    Muscle aches Muscle aches   Rosuvastatin Other (See Comments)    Muscle Aches Other reaction(s): JOINT PAIN Other reaction(s): JOINT PAIN Muscle Aches     Current Outpatient Medications  Medication Sig Dispense Refill   albuterol  (VENTOLIN  HFA) 108 (90 Base) MCG/ACT inhaler Inhale 1-2 puffs into the lungs every 6 (six) hours as needed for wheezing or shortness of breath. 1 each 3   amLODipine  (NORVASC ) 10 MG tablet Take 1 tablet (10 mg total) by mouth daily. 90 tablet 3   aspirin  EC 81 MG tablet Take 81 mg by mouth daily. Swallow whole.     carvedilol  (COREG ) 3.125 MG tablet Take 1 tablet (3.125 mg total) by mouth 2 (two) times daily with a meal. 180 tablet 3   cephALEXin  (KEFLEX ) 500 MG capsule Take 1 capsule (500 mg total) by mouth 2 (two) times daily for 14 days. Take with food 28 capsule 0   cholecalciferol  (VITAMIN D3) 25 MCG (1000 UNIT) tablet Take 2,000 Units by mouth 2 (two) times daily.     cloNIDine  (CATAPRES ) 0.1 MG tablet Take 1 tablet (0.1 mg total)  by mouth 2 (two) times daily as needed (pressure >175). 60 tablet 3   Evolocumab  (REPATHA  SURECLICK) 140 MG/ML SOAJ INJECT 1 ML UNDER THE SKIN EVERY 14 DAYS 6 mL 3   ezetimibe  (ZETIA ) 10 MG tablet Take 1 tablet (10 mg total) by mouth daily. 90 tablet 3   HUMALOG  MIX 75/25 KWIKPEN (75-25) 100 UNIT/ML KwikPen Inject into the skin. 75-25 pen injectior     hydrALAZINE  (APRESOLINE ) 100 MG tablet Take 1 tablet (100 mg total) by mouth 3 (three) times daily. 270 tablet 3   insulin  lispro (HUMALOG ) 100 UNIT/ML KwikPen Inject into the skin.     Insulin  Pen Needle (B-D ULTRAFINE III SHORT PEN) 31G X 8 MM MISC To use with pen basal and mealtime coverage insulin  pens 5 times daily. DX: E11.65 500 each 1   isosorbide  mononitrate (IMDUR ) 30  MG 24 hr tablet Take 1 tablet (30 mg total) by mouth 2 (two) times daily. 180 tablet 3   nitroGLYCERIN  (NITROSTAT ) 0.4 MG SL tablet Place 1 tablet (0.4 mg total) under the tongue every 5 (five) minutes as needed for chest pain. 25 tablet 3   nortriptyline  (PAMELOR ) 10 MG capsule Take 50 mg by mouth at bedtime.     pantoprazole  (PROTONIX ) 40 MG tablet Take 1 tablet (40 mg total) by mouth 2 (two) times daily before a meal. 180 tablet 3   torsemide  (DEMADEX ) 20 MG tablet Take 1 tablet (20 mg total) by mouth 2 (two) times daily. 180 tablet 3   TOUJEO  MAX SOLOSTAR 300 UNIT/ML Solostar Pen 60 Units daily.     No current facility-administered medications for this visit.    Family History Family History  Problem Relation Age of Onset   Diabetes Mother    Heart disease Father        Social History Social History   Tobacco Use   Smoking status: Every Day    Current packs/day: 0.25    Average packs/day: 0.3 packs/day for 39.0 years (9.8 ttl pk-yrs)    Types: Cigarettes    Passive exposure: Past   Smokeless tobacco: Never  Vaping Use   Vaping status: Never Used  Substance Use Topics   Alcohol use: Not Currently    Comment: very rarely - wine   Drug use: Never         ROS Full ROS of systems performed and is otherwise negative there than what is stated in the HPI  Physical Exam Blood pressure (!) 154/84, pulse 80, temperature 98.1 F (36.7 C), temperature source Oral, height 6' 2 (1.88 m), weight 187 lb (84.8 kg), SpO2 97%.  Alert and oriented x 3, no work of breathing on room air, regular rate and rhythm, moving all extremities spontaneously but does ambulate with a scooter, rectal exam performed in the presence of a chaperone.  In the left lateral perianal skin just anterior is a area of erythema and fluctuance that is painful to touch.  On digital rectal exam there is no gross blood or purulence from the anal canal.  Anoscopy performed and it is difficult to tell if there is an internal opening to suggest fistula formation but there is no purulent drainage he has grade 1 hemorrhoids.  Data Reviewed I independently reviewed his colonoscopy and this was significant for hemorrhoids as well as multiple polyps that were removed.  I have personally reviewed the patient's imaging and medical records.    Assessment    Patient with perirectal abscess that has been draining a little bit.  However there is still some fluctuance on exam and I think that he needs additional drainage.  I discussed with him that he may develop a fistula but I do not see any internal openings on anoscopy today.  He should continue antibiotics prescribed for him.  Procedure Preoperative diagnosis: Perirectal abscess Postoperative diagnosis: Perirectal abscess  After informed consent was obtained the patient was placed in the left lateral decubitus position on our exam room table.  The skin was cleansed with alcohol swabs.  An incision was made in the most fluctuant part of the abscess and there is immediate expulsion of approximately 5 cc of purulent drainage.  Using a Q-tip all loculations were bluntly broken up.  Iodoform packing was packed in the wound.  The patient tolerated  the procedure well.   A total of 45 minutes was spent  reviewing the patient's chart, performing history and physical and discussing treatment options with the patient.  This is irrespective of the time needed for procedures.   Jayson MALVA Endow 03/11/2024, 2:58 PM

## 2024-03-12 DIAGNOSIS — Z23 Encounter for immunization: Secondary | ICD-10-CM | POA: Diagnosis not present

## 2024-03-12 DIAGNOSIS — N186 End stage renal disease: Secondary | ICD-10-CM | POA: Diagnosis not present

## 2024-03-12 DIAGNOSIS — Z992 Dependence on renal dialysis: Secondary | ICD-10-CM | POA: Diagnosis not present

## 2024-03-13 DIAGNOSIS — Z992 Dependence on renal dialysis: Secondary | ICD-10-CM | POA: Diagnosis not present

## 2024-03-13 DIAGNOSIS — N186 End stage renal disease: Secondary | ICD-10-CM | POA: Diagnosis not present

## 2024-03-13 DIAGNOSIS — Z23 Encounter for immunization: Secondary | ICD-10-CM | POA: Diagnosis not present

## 2024-03-14 DIAGNOSIS — Z992 Dependence on renal dialysis: Secondary | ICD-10-CM | POA: Diagnosis not present

## 2024-03-14 DIAGNOSIS — N186 End stage renal disease: Secondary | ICD-10-CM | POA: Diagnosis not present

## 2024-03-14 DIAGNOSIS — Z23 Encounter for immunization: Secondary | ICD-10-CM | POA: Diagnosis not present

## 2024-03-15 DIAGNOSIS — N186 End stage renal disease: Secondary | ICD-10-CM | POA: Diagnosis not present

## 2024-03-15 DIAGNOSIS — Z992 Dependence on renal dialysis: Secondary | ICD-10-CM | POA: Diagnosis not present

## 2024-03-15 DIAGNOSIS — Z23 Encounter for immunization: Secondary | ICD-10-CM | POA: Diagnosis not present

## 2024-03-16 DIAGNOSIS — Z992 Dependence on renal dialysis: Secondary | ICD-10-CM | POA: Diagnosis not present

## 2024-03-16 DIAGNOSIS — Z23 Encounter for immunization: Secondary | ICD-10-CM | POA: Diagnosis not present

## 2024-03-16 DIAGNOSIS — N186 End stage renal disease: Secondary | ICD-10-CM | POA: Diagnosis not present

## 2024-03-17 DIAGNOSIS — Z992 Dependence on renal dialysis: Secondary | ICD-10-CM | POA: Diagnosis not present

## 2024-03-17 DIAGNOSIS — N186 End stage renal disease: Secondary | ICD-10-CM | POA: Diagnosis not present

## 2024-03-17 DIAGNOSIS — Z23 Encounter for immunization: Secondary | ICD-10-CM | POA: Diagnosis not present

## 2024-03-18 DIAGNOSIS — Z992 Dependence on renal dialysis: Secondary | ICD-10-CM | POA: Diagnosis not present

## 2024-03-18 DIAGNOSIS — Z23 Encounter for immunization: Secondary | ICD-10-CM | POA: Diagnosis not present

## 2024-03-18 DIAGNOSIS — N186 End stage renal disease: Secondary | ICD-10-CM | POA: Diagnosis not present

## 2024-03-19 DIAGNOSIS — N186 End stage renal disease: Secondary | ICD-10-CM | POA: Diagnosis not present

## 2024-03-19 DIAGNOSIS — Z23 Encounter for immunization: Secondary | ICD-10-CM | POA: Diagnosis not present

## 2024-03-19 DIAGNOSIS — Z992 Dependence on renal dialysis: Secondary | ICD-10-CM | POA: Diagnosis not present

## 2024-03-20 DIAGNOSIS — Z992 Dependence on renal dialysis: Secondary | ICD-10-CM | POA: Diagnosis not present

## 2024-03-20 DIAGNOSIS — Z23 Encounter for immunization: Secondary | ICD-10-CM | POA: Diagnosis not present

## 2024-03-20 DIAGNOSIS — N186 End stage renal disease: Secondary | ICD-10-CM | POA: Diagnosis not present

## 2024-03-21 DIAGNOSIS — N186 End stage renal disease: Secondary | ICD-10-CM | POA: Diagnosis not present

## 2024-03-21 DIAGNOSIS — Z23 Encounter for immunization: Secondary | ICD-10-CM | POA: Diagnosis not present

## 2024-03-21 DIAGNOSIS — Z992 Dependence on renal dialysis: Secondary | ICD-10-CM | POA: Diagnosis not present

## 2024-03-22 DIAGNOSIS — E113513 Type 2 diabetes mellitus with proliferative diabetic retinopathy with macular edema, bilateral: Secondary | ICD-10-CM | POA: Diagnosis not present

## 2024-03-22 DIAGNOSIS — Z992 Dependence on renal dialysis: Secondary | ICD-10-CM | POA: Diagnosis not present

## 2024-03-22 DIAGNOSIS — N186 End stage renal disease: Secondary | ICD-10-CM | POA: Diagnosis not present

## 2024-03-22 DIAGNOSIS — Z23 Encounter for immunization: Secondary | ICD-10-CM | POA: Diagnosis not present

## 2024-03-23 ENCOUNTER — Encounter: Payer: Self-pay | Admitting: Emergency Medicine

## 2024-03-23 ENCOUNTER — Other Ambulatory Visit: Payer: Self-pay

## 2024-03-23 ENCOUNTER — Emergency Department
Admission: EM | Admit: 2024-03-23 | Discharge: 2024-03-23 | Disposition: A | Discharge status: Home / Self Care | Attending: Emergency Medicine | Admitting: Emergency Medicine

## 2024-03-23 ENCOUNTER — Encounter: Payer: Self-pay | Admitting: General Surgery

## 2024-03-23 DIAGNOSIS — J449 Chronic obstructive pulmonary disease, unspecified: Secondary | ICD-10-CM | POA: Insufficient documentation

## 2024-03-23 DIAGNOSIS — E1122 Type 2 diabetes mellitus with diabetic chronic kidney disease: Secondary | ICD-10-CM | POA: Diagnosis not present

## 2024-03-23 DIAGNOSIS — N186 End stage renal disease: Secondary | ICD-10-CM | POA: Insufficient documentation

## 2024-03-23 DIAGNOSIS — M5412 Radiculopathy, cervical region: Secondary | ICD-10-CM | POA: Insufficient documentation

## 2024-03-23 DIAGNOSIS — G43909 Migraine, unspecified, not intractable, without status migrainosus: Secondary | ICD-10-CM | POA: Diagnosis not present

## 2024-03-23 DIAGNOSIS — G44209 Tension-type headache, unspecified, not intractable: Secondary | ICD-10-CM | POA: Diagnosis not present

## 2024-03-23 DIAGNOSIS — Z794 Long term (current) use of insulin: Secondary | ICD-10-CM | POA: Insufficient documentation

## 2024-03-23 DIAGNOSIS — Z992 Dependence on renal dialysis: Secondary | ICD-10-CM | POA: Insufficient documentation

## 2024-03-23 DIAGNOSIS — Z7982 Long term (current) use of aspirin: Secondary | ICD-10-CM | POA: Insufficient documentation

## 2024-03-23 DIAGNOSIS — I2510 Atherosclerotic heart disease of native coronary artery without angina pectoris: Secondary | ICD-10-CM | POA: Diagnosis not present

## 2024-03-23 DIAGNOSIS — M62838 Other muscle spasm: Secondary | ICD-10-CM | POA: Diagnosis not present

## 2024-03-23 DIAGNOSIS — M542 Cervicalgia: Secondary | ICD-10-CM | POA: Diagnosis not present

## 2024-03-23 DIAGNOSIS — M62830 Muscle spasm of back: Secondary | ICD-10-CM

## 2024-03-23 DIAGNOSIS — Z951 Presence of aortocoronary bypass graft: Secondary | ICD-10-CM | POA: Diagnosis not present

## 2024-03-23 DIAGNOSIS — I5020 Unspecified systolic (congestive) heart failure: Secondary | ICD-10-CM | POA: Diagnosis not present

## 2024-03-23 DIAGNOSIS — I132 Hypertensive heart and chronic kidney disease with heart failure and with stage 5 chronic kidney disease, or end stage renal disease: Secondary | ICD-10-CM | POA: Insufficient documentation

## 2024-03-23 DIAGNOSIS — Z79899 Other long term (current) drug therapy: Secondary | ICD-10-CM | POA: Insufficient documentation

## 2024-03-23 DIAGNOSIS — Z23 Encounter for immunization: Secondary | ICD-10-CM | POA: Diagnosis not present

## 2024-03-23 MED ORDER — OXYCODONE HCL 5 MG PO TABS
10.0000 mg | ORAL_TABLET | Freq: Once | ORAL | Status: AC
  Administered 2024-03-23: 10 mg via ORAL
  Filled 2024-03-23: qty 2

## 2024-03-23 MED ORDER — ONDANSETRON 4 MG PO TBDP
4.0000 mg | ORAL_TABLET | Freq: Once | ORAL | Status: AC
  Administered 2024-03-23: 4 mg via ORAL
  Filled 2024-03-23: qty 1

## 2024-03-23 MED ORDER — METHOCARBAMOL 500 MG PO TABS
500.0000 mg | ORAL_TABLET | Freq: Once | ORAL | Status: AC
  Administered 2024-03-23: 500 mg via ORAL
  Filled 2024-03-23: qty 1

## 2024-03-23 MED ORDER — LIDOCAINE 5 % EX PTCH
1.0000 | MEDICATED_PATCH | Freq: Once | CUTANEOUS | Status: DC
  Administered 2024-03-23: 1 via TRANSDERMAL
  Filled 2024-03-23: qty 1

## 2024-03-23 MED ORDER — ACETAMINOPHEN 500 MG PO TABS
1000.0000 mg | ORAL_TABLET | Freq: Once | ORAL | Status: AC
  Administered 2024-03-23: 1000 mg via ORAL
  Filled 2024-03-23: qty 2

## 2024-03-23 MED ORDER — METHOCARBAMOL 500 MG PO TABS: 500.0000 mg | ORAL_TABLET | Freq: Three times a day (TID) | ORAL | 0 refills | Status: DC | PRN

## 2024-03-23 MED ORDER — LIDOCAINE 5 % EX PTCH: 1.0000 | MEDICATED_PATCH | CUTANEOUS | 0 refills | Status: AC

## 2024-03-23 NOTE — ED Provider Notes (Signed)
 [facility] Provider Note    Event Date/Time   First MD Initiated Contact with Patient 03/23/24 [phone]     (approximate)   History   Neck Pain   HPI  [name] [name] is a 63 y.o. male with history of coronary artery disease, end-stage renal disease on daily peritoneal dialysis, CHF, hypertension, hyperlipidemia, diabetes, COPD, hepatic steatosis who presents to the emergency department with complaints of pain in the left posterior head, trapezius muscle that extends down his left arm and causes numbness and tingling.  Denies bowel or bladder incontinence, urinary retention.  No fever.  No prior neck or back surgery.  No history of any injury.  Reports he is wheelchair-bound but able to move his extremities normally.  He has oxycodone  at home that he was prescribed for chronic pain 2 years ago and states that this does help his pain but causes him to be very sleepy and he is looking for an alternative way to manage pain at home.  He does have a PCP as well as an orthopedist.  Reports when he has had similar symptoms he has required a cortisone injection in the shoulder.  Reports symptoms acute on chronic but worse over the past couple of weeks.  On review of the [place]  controlled substance reporting system, patient received 20 tablets of oxycodone  5 mg on 03/04/2024.   History provided by patient, wife.    Past Medical History:  Diagnosis Date   Anemia    Arthritis    CAD (coronary artery disease)    a. 12/2016 s/p CABG x 4 (LIMA->LAD, VG->RCA, VG->OM1, VG->D1); b. 02/2018 MV: EF 35%, small-med inferolaterlal infarct. No ischemia.   CKD (chronic kidney disease), stage IV [facility])    Peritoneal Dialysis   Colon polyps    COPD (chronic obstructive pulmonary disease) (HCC)    Dupre's syndrome    GERD (gastroesophageal reflux disease)    HFrEF (heart failure reduced ejection fraction) (HCC)    a.) 2018 EF 35%; b.) 02/2018 EF 50%; c). 02/2019 EF 40-45%; d.)  08/2019 Echo: EF 50-55%, no rwma, GrII DD; e.) 04/2020 Echo: EF 30-35%, mod-sev glob HK. Mild LVH. G2DD. Low-nl RV fxn. Mildly dil LA. Mild MR. Mild-mod Ao sclerosis w/o stenosis; f.) TTE 02/09/2021: EF 55%; septal HK, LAE; G2DD.   History of 2019 novel coronavirus disease (COVID-19) 02/08/2021   Hyperlipidemia    Hypertension    Ischemic cardiomyopathy    a.) 02/2018 MV: EF 35%; b.) 08/2019 Echo: EF 50-55%; c.) 04/2020 Echo: EF 30-35%; d.) TTE 02/09/2021: EF 50-55%.   Lymphedema    Migraine    Myocardial infarction (HCC)    Neuropathy    PAD (peripheral artery disease)    a. 04/2017 Aortoiliac duplex: R iliac dzs; b. 03/2020 LE Duplex: mild bilat atherosclerosis throughout. Patent vessels.   Pneumonia    Retinopathy    S/P CABG x 4    a.)  4v CABG: LIMA->LAD, SVG->RCA, SVG->OM1, SVG->D1   Sleep apnea    a.) does not require nocturnal PAP therapy   Statin intolerance    Stomach ulcer    T2DM (type 2 diabetes mellitus) (HCC)     Past Surgical History:  Procedure Laterality Date   bone graft surgery Bilateral    on both feet x 2   CAPD INSERTION N/A 11/13/2021   Procedure: LAPAROSCOPIC INSERTION CONTINUOUS AMBULATORY PERITONEAL DIALYSIS  (CAPD) CATHETER;  Surgeon: Jordis Laneta FALCON, MD;  Location: [facility] ORS;  Service: General;  Laterality:  N/A;   CAPD REVISION N/A 01/10/2022   Procedure: LAPAROSCOPIC REVISION CONTINUOUS AMBULATORY PERITONEAL DIALYSIS  (CAPD) CATHETER;  Surgeon: Jordis Laneta FALCON, MD;  Location: [facility] ORS;  Service: General;  Laterality: N/A;   CARDIAC CATHETERIZATION  2013   S/p PCI    CARDIAC CATHETERIZATION  2018   S/p CABG   CATARACT EXTRACTION Bilateral    COLONOSCOPY     COLONOSCOPY WITH PROPOFOL  N/A 10/17/2022   Procedure: COLONOSCOPY WITH PROPOFOL ;  Surgeon: Jinny Carmine, MD;  Location: [facility] ENDOSCOPY;  Service: Endoscopy;  Laterality: N/A;   CORONARY ARTERY BYPASS GRAFT  2018   (LIMA-LAD,VG-RCA,VG-OM1,VG-D1)   ESOPHAGOGASTRODUODENOSCOPY (EGD) WITH PROPOFOL  N/A  04/04/2020   Procedure: ESOPHAGOGASTRODUODENOSCOPY (EGD) WITH PROPOFOL ;  Surgeon: Jinny Carmine, MD;  Location: [facility] ENDOSCOPY;  Service: Endoscopy;  Laterality: N/A;   ESOPHAGOGASTRODUODENOSCOPY (EGD) WITH PROPOFOL  N/A 06/06/2020   Procedure: ESOPHAGOGASTRODUODENOSCOPY (EGD) WITH PROPOFOL ;  Surgeon: Jinny Carmine, MD;  Location: [facility] END[facility];  Service: Endoscopy;  Laterality: N/A;   ESOPHAGOGASTRODUODENOSCOPY (EGD) WITH PROPOFOL  N/A 09/24/2022   Procedure: ESOPHAGOGASTRODUODENOSCOPY (EGD) WITH PROPOFOL ;  Surgeon: Jinny Carmine, MD;  Location: [facility] END[facility];  Service: Endoscopy;  Laterality: N/A;   PERIPHERAL VASCULAR CATHETERIZATION Right 10/28/2016   PTA/DEB Right SFA   WRIST SURGERY Left    cyst    MEDICATIONS:  Prior to Admission medications   Medication Sig Start Date End Date Taking? Authorizing Provider  albuterol  (VENTOLIN  HFA) 108 (90 Base) MCG/ACT inhaler Inhale 1-2 puffs into the lungs every 6 (six) hours as needed for wheezing or shortness of breath. 04/15/23   Liana Fish, NP  amLODipine  (NORVASC ) 10 MG tablet Take 1 tablet (10 mg total) by mouth daily. 02/16/24   Gollan, Timothy J, MD  aspirin  EC 81 MG tablet Take 81 mg by mouth daily. Swallow whole.    [provider]  carvedilol  (COREG ) 3.125 MG tablet Take 1 tablet (3.125 mg total) by mouth 2 (two) times daily with a meal. 02/16/24   Gollan, Evalene PARAS, MD  cholecalciferol  (VITAMIN D3) 25 MCG (1000 UNIT) tablet Take 2,000 Units by mouth 2 (two) times daily.    [provider]  cloNIDine  (CATAPRES ) 0.1 MG tablet Take 1 tablet (0.1 mg total) by mouth 2 (two) times daily as needed (pressure >175). 02/16/24   Gollan, Timothy J, MD  Evolocumab  (REPATHA  SURECLICK) 140 MG/ML SOAJ INJECT 1 ML UNDER THE SKIN EVERY 14 DAYS 02/16/24   Gollan, Timothy J, MD  ezetimibe  (ZETIA ) 10 MG tablet Take 1 tablet (10 mg total) by mouth daily. 02/16/24   Gollan, Timothy J, MD  HUMALOG  MIX 75/25 KWIKPEN (75-25) 100 UNIT/ML KwikPen  Inject into the skin. 75-25 pen injectior    [provider]  hydrALAZINE  (APRESOLINE ) 100 MG tablet Take 1 tablet (100 mg total) by mouth 3 (three) times daily. 02/16/24 02/10/25  Gollan, Timothy J, MD  insulin  lispro (HUMALOG ) 100 UNIT/ML KwikPen Inject into the skin.    [provider]  Insulin  Pen Needle (B-D ULTRAFINE III SHORT PEN) 31G X 8 MM MISC To use with pen basal and mealtime coverage insulin  pens 5 times daily. DX: E11.65 04/17/20   Hanford Powell BRAVO, NP  isosorbide  mononitrate (IMDUR ) 30 MG 24 hr tablet Take 1 tablet (30 mg total) by mouth 2 (two) times daily. 02/16/24   Gollan, Timothy J, MD  nitroGLYCERIN  (NITROSTAT ) 0.4 MG SL tablet Place 1 tablet (0.4 mg total) under the tongue every 5 (five) minutes as needed for chest pain. 02/16/24   Gollan, Timothy J, MD  nortriptyline  (PAMELOR ) 10 MG capsule Take 50 mg by mouth at bedtime.    [provider]  pantoprazole  (PROTONIX ) 40 MG tablet Take 1 tablet (40 mg total) by mouth 2 (two) times daily before a meal. 11/19/23   Abernathy, Mardy, NP  torsemide  (DEMADEX ) 20 MG tablet Take 1 tablet (20 mg total) by mouth 2 (two) times daily. 02/16/24   Gollan, Timothy J, MD  TOUJEO  MAX SOLOSTAR 300 UNIT/ML Solostar Pen 60 Units daily.    [provider]    Physical Exam   Triage Vital Signs: ED Triage Vitals  Encounter Vitals Group     BP 03/23/24 0457 (!) 155/81     Girls Systolic BP Percentile --      Girls Diastolic BP Percentile --      Boys Systolic BP Percentile --      Boys Diastolic BP Percentile --      Pulse Rate 03/23/24 0457 87     Resp 03/23/24 0457 20     Temp 03/23/24 0457 97.9 F (36.6 C)     Temp Source 03/23/24 0457 Oral     SpO2 03/23/24 0457 97 %     Weight 03/23/24 0455 194 lb 3.6 oz (88.1 kg)     Height 03/23/24 0455 6' 1 (1.854 m)     Head Circumference --      Peak Flow --      Pain Score 03/23/24 0455 8     Pain Loc --      Pain Education --      Exclude from Growth Chart  --     Most recent vital signs: Vitals:   03/23/24 0457  BP: (!) 155/81  Pulse: 87  Resp: 20  Temp: 97.9 F (36.6 C)  SpO2: 97%    CONSTITUTIONAL: Alert, responds appropriately to questions.  Chronically ill-appearing, thin HEAD: Normocephalic, atraumatic EYES: Conjunctivae clear, pupils appear equal, sclera nonicteric ENT: normal nose; moist mucous membranes NECK: Supple, normal ROM, tender over the left trapezius muscle and the posterior left occiput but no overlying skin changes.  No midline spinal tenderness or step-off or deformity. CARD: RRR; S1 and S2 appreciated RESP: Normal chest excursion without splinting or tachypnea; breath sounds clear and equal bilaterally; no wheezes, no rhonchi, no rales, no hypoxia or respiratory distress, speaking full sentences ABD/GI: Non-distended; PD catheter in place BACK: The back appears normal, no midline spinal tenderness or step-off or deformity EXT: Normal ROM in all joints; no deformity noted, no edema, 2+ left radial pulse, compartments in the left arm are soft, no joint effusion noted, full range of motion of all joints of the left arm, normal capillary refill SKIN: Normal color for age and race; warm; no rash on exposed skin NEURO: Moves all extremities equally, normal speech, normal sensation, no hyperreflexia PSYCH: The patient's mood and manner are appropriate.   ED Results / Procedures / Treatments   LABS: (all labs ordered are listed, but only abnormal results are displayed) Labs Reviewed - No data to display   EKG:  EKG Interpretation Date/Time:    Ventricular Rate:    PR Interval:    QRS Duration:    QT Interval:    QTC Calculation:   R Axis:      Text Interpretation:           RADIOLOGY: My personal review and interpretation of imaging:    I have personally reviewed all radiology reports.   No results found.   PROCEDURES:  Critical  Care performed: No      Procedures    IMPRESSION / MDM /  ASSESSMENT AND PLAN / ED COURSE  I reviewed the triage vital signs and the nursing notes.    Patient here with complaints of left posterior headache, neck pain with tingling down the left arm.  The patient is on the cardiac monitor to evaluate for evidence of arrhythmia and/or significant heart rate changes.   DIFFERENTIAL DIAGNOSIS (includes but not limited to):   Left trapezius muscle spasm, muscle strain, cervical radiculopathy.  Patient does not have symptoms of cervical myelopathy.  Doubt epidural abscess or hematoma, discitis or osteomyelitis, transverse myelitis, fracture.   Patient's presentation is most consistent with acute complicated illness / injury requiring diagnostic workup.   PLAN: Patient reports taking oxycodone  at home with good relief but feels like it makes him very sleepy and he does not like to take it.  He is looking for alternative ways to manage his pain.  Will give Tylenol , Robaxin, Lidoderm  patch.  He seems very tender over the trapezius muscle especially where it inserts into the occiput.  I suspect that this is causing a tension headache.  He does have some symptoms of cervical radiculopathy but no symptoms of cervical myelopathy.  No indication for emergent MRI of the neck today.  He agrees with this plan.  Will avoid NSAIDs given he is on peritoneal dialysis.  Will avoid steroids given he is a diabetic.   MEDICATIONS GIVEN IN ED: Medications  lidocaine  (LIDODERM ) 5 % 1 patch (1 patch Transdermal Patch Applied 03/23/24 0525)  methocarbamol (ROBAXIN) tablet 500 mg (500 mg Oral Given 03/23/24 0525)  acetaminophen  (TYLENOL ) tablet 1,000 mg (1,000 mg Oral Given 03/23/24 0525)  oxyCODONE  (Oxy IR/ROXICODONE ) immediate release tablet 10 mg (10 mg Oral Given 03/23/24 0618)  ondansetron  (ZOFRAN -ODT) disintegrating tablet 4 mg (4 mg Oral Given 03/23/24 0619)     ED COURSE:  6:15 AM  Pt denies any significant relief with Robaxin, Tylenol .  Will give oxycodone , Zofran   and reassess.  Patient states that he has 19 tablets of oxycodone  at home and does not need a refill.  He would like to be discharged because he states oxycodone  here will make him very sleepy and he wants to be at home when that happens.  He has an appointment to see his orthopedist this week.  Recommended close follow-up with his primary care doctor as well.  I feel he may need an MRI but as an outpatient as he currently has no red flag symptoms.  He is agreeable to this plan.   At this time, I do not feel there is any life-threatening condition present. I reviewed all nursing notes, vitals, pertinent previous records.  All lab and urine results, EKGs, imaging ordered have been independently reviewed and interpreted by myself.  I reviewed all available radiology reports from any imaging ordered this visit.  Based on my assessment, I feel the patient is safe to be discharged home without further emergent workup and can continue workup as an outpatient as needed. Discussed all findings, treatment plan as well as usual and customary return precautions.  They verbalize understanding and are comfortable with this plan.  Outpatient follow-up has been provided as needed.  All questions have been answered.   CONSULTS:  none   OUTSIDE RECORDS REVIEWED: Reviewed recent pulmonology, cardiology, orthopedic notes.       FINAL CLINICAL IMPRESSION(S) / ED DIAGNOSES   Final diagnoses:  Cervical radiculopathy  Spasm of  left trapezius muscle  Tension headache     Rx / DC Orders   ED Discharge Orders          Ordered    methocarbamol (ROBAXIN) 500 MG tablet  Every 8 hours PRN        03/23/24 0632    lidocaine  (LIDODERM ) 5 %  Every 24 hours        03/23/24 [id number]             Note:  This document was prepared using Dragon voice recognition software and may include unintentional dictation errors.   Maricruz Lucero, Josette SAILOR, DO 03/23/24 [phone]

## 2024-03-23 NOTE — ED Triage Notes (Addendum)
 Pt to triage via w/c; pt is very irritable, cursing & demanding a pillow to sit on; st I have a pinched nerve in my neck, my elbow and my wrist and oxycodone  isn't helping anymore; pt denies any injury, st it just happened; pt denies being diagnosed with such, st he just knows it; st took tylenol  PM hr PTA; pt is peritoneal dialysis

## 2024-03-24 ENCOUNTER — Ambulatory Visit: Admitting: Podiatry

## 2024-03-24 ENCOUNTER — Telehealth: Payer: Self-pay | Admitting: Nurse Practitioner

## 2024-03-24 DIAGNOSIS — Z23 Encounter for immunization: Secondary | ICD-10-CM | POA: Diagnosis not present

## 2024-03-24 DIAGNOSIS — B351 Tinea unguium: Secondary | ICD-10-CM

## 2024-03-24 DIAGNOSIS — M79676 Pain in unspecified toe(s): Secondary | ICD-10-CM

## 2024-03-24 DIAGNOSIS — E1142 Type 2 diabetes mellitus with diabetic polyneuropathy: Secondary | ICD-10-CM

## 2024-03-24 DIAGNOSIS — N186 End stage renal disease: Secondary | ICD-10-CM | POA: Diagnosis not present

## 2024-03-24 DIAGNOSIS — Z794 Long term (current) use of insulin: Secondary | ICD-10-CM

## 2024-03-24 DIAGNOSIS — Z992 Dependence on renal dialysis: Secondary | ICD-10-CM | POA: Diagnosis not present

## 2024-03-24 NOTE — Telephone Encounter (Signed)
 Called patient to schedule ED follow up for neck pain. He stated he already has appointment with orthopedic-toni

## 2024-03-24 NOTE — Progress Notes (Signed)
 He presents today for follow-up of his neuropathy bilateral lower extremity.  States that he will soon be placed on a kidney pancreas list.  Continues at home peritoneal dialysis manual.   Denies any cuts or bruises on his feet or legs.  Last A1c was 7.5 states that the numbness in the legs seems to be worsening and feels that they are becoming weaker.  Objective: Vital signs are stable alert oriented x 3 pulses are barely palpable lower extremity are cool to the touch.  Nails are brittle.  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  Protective sensation is lost per Semmes Weinstein monofilament tuning fork and pain to the level of the knee.  Assessment severe diabetic peripheral neuropathy nonambulatory.  Onychomycosis   Plan: Follow-up with me in 3 to 6 months.  Debridement of nails

## 2024-03-25 ENCOUNTER — Ambulatory Visit

## 2024-03-25 ENCOUNTER — Other Ambulatory Visit: Payer: Self-pay

## 2024-03-25 ENCOUNTER — Ambulatory Visit: Admitting: General Surgery

## 2024-03-25 ENCOUNTER — Encounter: Payer: Self-pay | Admitting: General Surgery

## 2024-03-25 VITALS — BP 148/75 | HR 79 | Ht 74.0 in | Wt 187.0 lb

## 2024-03-25 DIAGNOSIS — G8929 Other chronic pain: Secondary | ICD-10-CM

## 2024-03-25 DIAGNOSIS — M50221 Other cervical disc displacement at C4-C5 level: Secondary | ICD-10-CM | POA: Diagnosis not present

## 2024-03-25 DIAGNOSIS — K611 Rectal abscess: Secondary | ICD-10-CM

## 2024-03-25 DIAGNOSIS — Z992 Dependence on renal dialysis: Secondary | ICD-10-CM | POA: Diagnosis not present

## 2024-03-25 DIAGNOSIS — M25512 Pain in left shoulder: Secondary | ICD-10-CM

## 2024-03-25 DIAGNOSIS — N186 End stage renal disease: Secondary | ICD-10-CM | POA: Diagnosis not present

## 2024-03-25 DIAGNOSIS — M7542 Impingement syndrome of left shoulder: Secondary | ICD-10-CM

## 2024-03-25 DIAGNOSIS — M542 Cervicalgia: Secondary | ICD-10-CM

## 2024-03-25 DIAGNOSIS — M19012 Primary osteoarthritis, left shoulder: Secondary | ICD-10-CM | POA: Diagnosis not present

## 2024-03-25 DIAGNOSIS — M7021 Olecranon bursitis, right elbow: Secondary | ICD-10-CM

## 2024-03-25 DIAGNOSIS — Z23 Encounter for immunization: Secondary | ICD-10-CM | POA: Diagnosis not present

## 2024-03-25 DIAGNOSIS — M50322 Other cervical disc degeneration at C5-C6 level: Secondary | ICD-10-CM | POA: Diagnosis not present

## 2024-03-25 DIAGNOSIS — M50321 Other cervical disc degeneration at C4-C5 level: Secondary | ICD-10-CM | POA: Diagnosis not present

## 2024-03-25 DIAGNOSIS — Z09 Encounter for follow-up examination after completed treatment for conditions other than malignant neoplasm: Secondary | ICD-10-CM

## 2024-03-25 DIAGNOSIS — M4722 Other spondylosis with radiculopathy, cervical region: Secondary | ICD-10-CM | POA: Diagnosis not present

## 2024-03-25 MED ORDER — TRIAMCINOLONE ACETONIDE 40 MG/ML IJ SUSP
40.0000 mg | INTRAMUSCULAR | Status: AC | PRN
  Administered 2024-03-25: 40 mg via INTRA_ARTICULAR

## 2024-03-25 NOTE — Progress Notes (Signed)
 Chief Complaint: Right olecranon bursitis 6 week f/u and New Neck/Left Shoulder Pain    History of Present Illness:    [name] [name] is a 63 y.o. right-hand-dominant male presents to [facility] [place] for 6 weeks follow-up regarding right olecranon bursitis.  Presents in motorized scooter with wife present today.  Patient originally seen at [facility] on 02/20/2024 with two week history of right olecranon bursitis. During history and exam, no indication of infection. Recommended rest, ice, compression via ACE wrap. No NSAID usage due to current dialysis status.  Patient states there has been no change.  No increase or decrease in size.  No associated erythema or warmth.  No associated pain.  No change in range of motion or strength.  No concern for infection.  Patient states primarily he presents today due to new onset severe neck pain.  Present for 2 weeks.  Began as sleeping wrong and developing a stiff neck.  Associated radiating pain down left upper extremity.  Exacerbated with laying down flat, using a recliner to sleep.  Patient reports associated subjective left arm weakness compared to right arm.  He recalls having a cervical spine x-ray in 2021 but does not recall seeing a specialist.  He does note occasional stiffness in the neck but more severe recent symptoms.  Patient also with left shoulder pain.  Located on lateral aspect.  Exacerbated with any use of left arm, range of motion.  Patient states he has had pain on and off similar to this for 30 years.  Typically receives a corticosteroid injection every 5 to 8 years, that relieves his pain.  Patient is right-hand dominant.  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  Patient currently on peritoneal dialysis; administered by wife, three times a day, seven days a week.  Last A1c 6.6% on 11/21/2023, 4 months ago.  Patient seen at  [facility] ED on 03/23/2024 for significant worsening of neck/shoulder pain. Given Tylenol , Robaxin, and Lidoderm  patch for pain management. Patient has oxycodone  at home.      PMH/PSH/Social History/Family History/Allergies/Meds:   Past Medical History:  Diagnosis Date   Anemia    Arthritis    CAD (coronary artery disease)    a. 12/2016 s/p CABG x 4 (LIMA->LAD, VG->RCA, VG->OM1, VG->D1); b. 02/2018 MV: EF 35%, small-med inferolaterlal infarct. No ischemia.   CKD (chronic kidney disease), stage IV [facility])    Peritoneal Dialysis   Colon polyps    COPD (chronic obstructive pulmonary disease) (HCC)    Dupre's syndrome    GERD (gastroesophageal reflux disease)    HFrEF (heart failure reduced ejection fraction) (HCC)    a.) 2018 EF 35%; b.) 02/2018 EF 50%; c). 02/2019 EF 40-45%; d.) 08/2019 Echo: EF 50-55%, no rwma, GrII DD; e.) 04/2020 Echo: EF 30-35%, mod-sev glob HK. Mild LVH. G2DD. Low-nl RV fxn. Mildly dil LA. Mild MR. Mild-mod Ao sclerosis w/o stenosis; f.) TTE 02/09/2021: EF 55%; septal HK, LAE; G2DD.   History of 2019 novel coronavirus disease (COVID-19) 02/08/2021   Hyperlipidemia    Hypertension    Ischemic cardiomyopathy    a.) 02/2018 MV: EF 35%; b.) 08/2019 Echo: EF 50-55%; c.) 04/2020 Echo: EF 30-35%; d.) TTE 02/09/2021: EF 50-55%.   Lymphedema    Migraine    Myocardial infarction [facility])    Neuropathy    PAD (peripheral artery disease)    a. 04/2017 Aortoiliac duplex: R iliac dzs; b. 03/2020 LE Duplex:  mild bilat atherosclerosis throughout. Patent vessels.   Pneumonia    Retinopathy    S/P CABG x 4    a.)  4v CABG: LIMA->LAD, SVG->RCA, SVG->OM1, SVG->D1   Sleep apnea    a.) does not require nocturnal PAP therapy   Statin intolerance    Stomach ulcer    T2DM (type 2 diabetes mellitus) (HCC)    Past Surgical History:  Procedure Laterality Date   bone graft surgery Bilateral    on both feet x 2   CAPD INSERTION N/A 11/13/2021   Procedure: LAPAROSCOPIC INSERTION CONTINUOUS  AMBULATORY PERITONEAL DIALYSIS  (CAPD) CATHETER;  Surgeon: Jordis Laneta FALCON, MD;  Location: [facility] ORS;  Service: General;  Laterality: N/A;   CAPD REVISION N/A 01/10/2022   Procedure: LAPAROSCOPIC REVISION CONTINUOUS AMBULATORY PERITONEAL DIALYSIS  (CAPD) CATHETER;  Surgeon: Jordis Laneta FALCON, MD;  Location: [facility] ORS;  Service: General;  Laterality: N/A;   CARDIAC CATHETERIZATION  2013   S/p PCI    CARDIAC CATHETERIZATION  2018   S/p CABG   CATARACT EXTRACTION Bilateral    COLONOSCOPY     COLONOSCOPY WITH PROPOFOL  N/A 10/17/2022   Procedure: COLONOSCOPY WITH PROPOFOL ;  Surgeon: Jinny Carmine, MD;  Location: [facility] ENDOSCOPY;  Service: Endoscopy;  Laterality: N/A;   CORONARY ARTERY BYPASS GRAFT  2018   (LIMA-LAD,VG-RCA,VG-OM1,VG-D1)   ESOPHAGOGASTRODUODENOSCOPY (EGD) WITH PROPOFOL  N/A 04/04/2020   Procedure: ESOPHAGOGASTRODUODENOSCOPY (EGD) WITH PROPOFOL ;  Surgeon: Jinny Carmine, MD;  Location: [facility] ENDOSCOPY;  Service: Endoscopy;  Laterality: N/A;   ESOPHAGOGASTRODUODENOSCOPY (EGD) WITH PROPOFOL  N/A 06/06/2020   Procedure: ESOPHAGOGASTRODUODENOSCOPY (EGD) WITH PROPOFOL ;  Surgeon: Jinny Carmine, MD;  Location: [facility] ENDOSCOPY;  Service: Endoscopy;  Laterality: N/A;   ESOPHAGOGASTRODUODENOSCOPY (EGD) WITH PROPOFOL  N/A 09/24/2022   Procedure: ESOPHAGOGASTRODUODENOSCOPY (EGD) WITH PROPOFOL ;  Surgeon: Jinny Carmine, MD;  Location: [facility] ENDOSCOPY;  Service: Endoscopy;  Laterality: N/A;   PERIPHERAL VASCULAR CATHETERIZATION Right 10/28/2016   PTA/DEB Right SFA   WRIST SURGERY Left    cyst   Social History   Socioeconomic History   Marital status: Married    Spouse name: Roxanne   Number of children: Not on file   Years of education: Not on file   Highest education level: Not on file  Occupational History   Not on file  Tobacco Use   Smoking status: Every Day    Current packs/day: 0.25    Average packs/day: 0.3 packs/day for 39.0 years (9.8 ttl pk-yrs)    Types: Cigarettes    Passive exposure: Past    Smokeless tobacco: Never  Vaping Use   Vaping status: Never Used  Substance and Sexual Activity   Alcohol use: Not Currently    Comment: very rarely - wine   Drug use: Never   Sexual activity: Not Currently  Other Topics Concern   Not on file  Social History Narrative   Not on file   Social Drivers of Health   Financial Resource Strain: Low Risk  (10/30/2023)   Received from [facility]   Overall Financial Resource Strain (CARDIA)    Difficulty of Paying Living Expenses: Not hard at all  Food Insecurity: No Food Insecurity (10/30/2023)   Received from [facility]   Hunger Vital Sign    Within the past 12 months, you worried that your food would run out before you got the money to buy more.: Never true    Within the past 12 months, the food you bought just didn't last and you didn't have money to  get more.: Never true  Transportation Needs: No Transportation Needs (10/30/2023)   Received from [facility] - Transportation    In the past 12 months, has lack of transportation kept you from medical appointments or from getting medications?: No    Lack of Transportation (Non-Medical): No  Physical Activity: Unknown (09/09/2021)   Received from [facility]   Exercise Vital Sign    On average, how many days per week do you engage in moderate to strenuous exercise (like a brisk walk)?: 0 days    Minutes of Exercise per Session: Not on file  Stress: No Stress Concern Present (02/23/2023)   [employer] - Occupational Stress Questionnaire    Feeling of Stress : Only a little  Social Connections: Unknown (09/17/2022)   Received from [facility]   Social Network    Social Network: Not on file   Family History  Problem Relation Age of Onset   Diabetes Mother    Heart disease Father    Allergies  Allergen Reactions   Iodinated Contrast Media Other (See Comments)    Kidney disease  Kidney  disease  Kidney disease  Kidney disease   Other Other (See Comments)    Pain  With joint stiffness     Statins Other (See Comments)    Pain  With joint stiffness   Pain  With joint stiffness  Other Reaction(s): Joint Pain   Pregabalin Other (See Comments)    Generalized aches and pains Generalized aches and pains Generalized aches and pains Generalized aches and pains Generalized aches and pains   Sacubitril-Valsartan Other (See Comments)    Hyperkalemia  Hyperkalemia Hyperkalemia  Hyperkalemia   Atorvastatin Other (See Comments)    Muscle aches Muscle aches   Pravastatin Other (See Comments)    Muscle aches Muscle aches   Rosuvastatin Other (See Comments)    Muscle Aches Other reaction(s): JOINT PAIN Other reaction(s): JOINT PAIN Muscle Aches    Current Outpatient Medications  Medication Sig Dispense Refill   albuterol  (VENTOLIN  HFA) 108 (90 Base) MCG/ACT inhaler Inhale 1-2 puffs into the lungs every 6 (six) hours as needed for wheezing or shortness of breath. 1 each 3   amLODipine  (NORVASC ) 10 MG tablet Take 1 tablet (10 mg total) by mouth daily. 90 tablet 3   aspirin  EC 81 MG tablet Take 81 mg by mouth daily. Swallow whole.     carvedilol  (COREG ) 3.125 MG tablet Take 1 tablet (3.125 mg total) by mouth 2 (two) times daily with a meal. 180 tablet 3   cholecalciferol  (VITAMIN D3) 25 MCG (1000 UNIT) tablet Take 2,000 Units by mouth 2 (two) times daily.     cloNIDine  (CATAPRES ) 0.1 MG tablet Take 1 tablet (0.1 mg total) by mouth 2 (two) times daily as needed (pressure >175). 60 tablet 3   Evolocumab  (REPATHA  SURECLICK) 140 MG/ML SOAJ INJECT 1 ML UNDER THE SKIN EVERY 14 DAYS 6 mL 3   ezetimibe  (ZETIA ) 10 MG tablet Take 1 tablet (10 mg total) by mouth daily. 90 tablet 3   HUMALOG  MIX 75/25 KWIKPEN (75-25) 100 UNIT/ML KwikPen Inject into the skin. 75-25 pen injectior     hydrALAZINE  (APRESOLINE ) 100 MG tablet Take 1 tablet (100 mg total) by mouth 3 (three) times daily. 270  tablet 3   insulin  lispro (HUMALOG ) 100 UNIT/ML KwikPen Inject into the skin.     Insulin  Pen Needle (B-D ULTRAFINE III SHORT PEN) 31G X 8 MM MISC To  use with pen basal and mealtime coverage insulin  pens 5 times daily. DX: E11.65 500 each 1   isosorbide  mononitrate (IMDUR ) 30 MG 24 hr tablet Take 1 tablet (30 mg total) by mouth 2 (two) times daily. 180 tablet 3   lidocaine  (LIDODERM ) 5 % Place 1 patch onto the skin daily for 10 days. Remove & Discard patch within 12 hours or as directed by MD 10 patch 0   methocarbamol (ROBAXIN) 500 MG tablet Take 1 tablet (500 mg total) by mouth every 8 (eight) hours as needed. 15 tablet 0   nitroGLYCERIN  (NITROSTAT ) 0.4 MG SL tablet Place 1 tablet (0.4 mg total) under the tongue every 5 (five) minutes as needed for chest pain. 25 tablet 3   nortriptyline  (PAMELOR ) 10 MG capsule Take 50 mg by mouth at bedtime.     pantoprazole  (PROTONIX ) 40 MG tablet Take 1 tablet (40 mg total) by mouth 2 (two) times daily before a meal. 180 tablet 3   torsemide  (DEMADEX ) 20 MG tablet Take 1 tablet (20 mg total) by mouth 2 (two) times daily. 180 tablet 3   TOUJEO  MAX SOLOSTAR 300 UNIT/ML Solostar Pen 60 Units daily.     No current facility-administered medications for this visit.   No results found.  I have reviewed past medical, surgical, social and family history, medications, and allergies as documented in the EMR.   Review of Systems:    A ROS was performed including pertinent positives and negatives as documented in the HPI.   Physical Exam :    General/Constitutional: NAD and appears stated age Vascular: No edema, swelling or tenderness, except as noted in detailed exam Integumentary: No impressive skin lesions present, except as noted in detailed exam Neurological: Alert and oriented Psych: Appropriate affect and cooperative Musculoskeletal: Normal, except as noted in detailed exam and HPI There were no vitals taken for this visit.    Focused Orthopedic Exam:     Spine/Neck focused exam: Inspection of neck reveals kyphotic cervical spine in resting/sitting position. Patient with neck hunched forward.  Muscle wasting of bilateral hands. Palpation: non-tender to palpation about the mid-line spine.  Tenderness about the left paraspinal musculature and left trapezius ROM: Limited ROM, primarily with flexion and extension. Patient able to rotate and laterally bend without eliciting pain or radiculopathy   -Strength exam      Left  Right  Interosseus   4+/5   5/5 Wrist extension  5/5  5/5 Wrist flexion   5/5  5/5 Elbow flexion   5/5  5/5 Deltoid    5/5  5/5     Left Shoulder focused exam:  Skin intact about the left shoulder.  No obvious signs of trauma or deformity.  Continuous glucose monitor noted about the posterior upper arm.  Tenderness to palpation over the lateral shoulder adjacent to the acromion.    LEFT  Scapula Atrophy Negative   Winging Negative  Rotator cuff Supraspinatus 5/5   Infraspinatus 5/5   Subscapularis 5/5  AROM/PROM (degrees) FF 0-160 / 0-170   ER0 0-40 / 0-50   IR(back) T10 / T10  Palpation (pain): AC negative   Biceps negative   Coracoid negative  Special Tests: O'Briens negative   Mayo-shear negative   Cross body Adduction negative   Speeds  negative   Jobe's negative   Neer positive   Hawkins positive   Belly Press negative   Hornblower's negative  Other: Sulcus sign negative   Lateral deltoid 5/5    Vascular/Lymphatic: Fingers  cool but well perfused with 2+ radial pulse.    Neurologic: Sensation globally diminished over C5, C6, C7 nerve distributions-per patient baseline due to neuropathy     Imaging:    Xray (cervical spine): X-rays of the cervical spine including AP and lateral were obtained in office today and reviewed with the patient.  Per my interpretation, there are no obvious fractures or dislocations.  Loss of cervical lordosis with kyphotic deformity.  Severe degenerative disc  disease at C4, C5, and C6.  Retrolisthesis noted at these levels with facet arthrosis.  In comparison to cervical spine radiographs from 2021, worsening of degenerative changes at these levels.   X-ray (Left shoulder): X-rays of the left shoulder including AP, Grashey, scapular Y and axillary were obtained in office today and reviewed with the patient.  Per my interpretation, there are no acute fractures or dislocations.  Degenerative changes noted about the glenoid rim.  Mild degenerative changes noted about the acromioclavicular joint. Downward sloping acromion.  Prior vascular wires noted in the thoracic cavity.  Continuous glucose monitor noted about the soft tissue adjacent mid humerus.         Assessment and Plan:    Right elbow non-septic olecranon bursitis-stable Left shoulder impingement syndrome Cervical spondylolisthesis with kyphotic deformity   Patient was seen and examined in office today with wife present.  We discussed follow-up of his right elbow olecranon bursitis, left shoulder and cervical spine.    In regards to patient's right elbow olecranon bursitis, no new complaints or issues today.  No concern for infection or worsening appearance.  Patient has painless range of motion.  He has a history of olecranon bursitis on the left elbow, aware of concerning signs and symptoms.  He will continue utilizing compression sleeve as needed and avoidance of leaning on the right elbow.  Regarding patient's cervical spine, history of prior cervical spine radiographs in 2021.  Radiographs today reveal worsening kyphotic deformity with severe degenerative disc disease and retrolisthesis at multiple levels.  Patient does complain of radicular symptoms which may be masked by underlying neuropathy.  We believe he would benefit from referral to neurosurgery for further evaluation and management.  We have also suggested referral to pain management for consideration of injections for symptom  relief.  Referrals have been placed for both neurosurgery and pain management today.  Patient also with left shoulder pain which he reports is chronic in nature.  He has received at least 3 or 4 subacromial left shoulder steroid injections in the past with lasting relief.  We reviewed conservative treatment measures today as patient's symptoms correlate with left shoulder impingement syndrome.  Discussed physical therapy for stretching, strengthening and other modalities.  Patient is nonambulatory and receives dialysis at home, therefore we have requested home health physical therapy.  Patient is also interested in subacromial injection today.  We did discuss the risks and benefits with the patient especially given his medical comorbidities including dialysis and diabetes mellitus.  He acknowledges risks and benefits and would like to proceed with injection today.  From orthopedic standpoint, he may follow-up as needed for his left shoulder and right elbow.  Cervical spine to be managed by neurosurgery and pain management.   All questions and concerns were addressed to the best of my ability.  Follow-up: As needed   Arlyss GEANNIE Schneider DO Orthopedic Surgery & Sports Medicine [facility]   This document was dictated using Dragon voice recognition software. A reasonable attempt at proof reading  has been made to minimize errors.   Large Joint Inj: L subacromial bursa on 03/25/2024 3:02 PM Indications: pain Details: (21 G) 1.5 in needle, posterior approach Medications: 40 mg triamcinolone acetonide 40 MG/ML (Ropivacaine  0.5% (4cc)) Outcome: tolerated well, no immediate complications Procedure, treatment alternatives, risks and benefits explained, specific risks discussed. Patient was prepped and draped in the usual sterile fashion.       Left shoulder subacromial injection procedure: The risks and benefits of a cortisone injection were discussed and the patient wishes  to proceed.  The anatomic landmarks of the left shoulder were palpated.  The posterior shoulder was cleansed with alcohol swabs and ChloraPrep.  Using sterile technique, a 21 gauge needle was introduced into the subacromial space.  After aspiration revealed no blood, the injectate which consisted of 1cc of 40 mg/mL of Kenalog and 4cc of 0.5% ropivacaine  was injected into the area.  The needle was withdrawn and pressure was applied for hemostasis.  A bandage was applied.  The patient tolerated the procedure well and was told to ice the area tonight if it is sore.  Cortisone Injections You have received a cortisone injection today. This injection consists of a numbing medication and cortisone.  There may be side effects after receiving the injection.   If you are diabetic: Your blood sugar may increase for up to 48 hours.  Check it more often than normal.   General reactions: A cortisone flare reaction. This generally occurs 6-8 hours after receiving the injection.  Ice the area and take Tylenol  for pain.  If the pain lasts longer than 48 hours call the office.   Some patients may experience flushing, increased heart rate, red face or increase in body temperature.  This is rare but can happen.   Over the counter Benadryl, if appropriate, often reduces these symptoms.  For a severe reaction contact your family doctor or go to the emergency room.  If you have any other questions, feel free to ask.

## 2024-03-25 NOTE — Patient Instructions (Addendum)
 [facility] [address], [place] [address] [phone]   Thank you for visiting the office today. We appreciate your trust and allowing us  to help you with your orthopedic needs.  Please do not hesitate to call if you have further questions or concerns following your visit with us . If you experience life-threatening symptoms or it cannot wait until normal office hours, please go to the nearest Emergency Department for immediate evaluation.      Cortisone Injection Patient Information  -A cortisone injection has been recommended for you today during your office visit. The injection consists of two medications administered at the same time. The first medication is a numbing medication. This medication will help you to feel better today for a couple hours.  However, this medication will wear off today and your discomfort may return. The second medication is the steroid medication. This medication will usually take a few days for full effect.   -Some individuals, about 1 in 20, can have an increase in their pain after the injection for approximately 24-36 hours. This is called a steroid flare and may be associated with some slight flushing of the face. The best thing to do if this occurs is to ice the area as much as possible and take ibuprofen if you are able.   -Any patients who are currently receiving treatment for problems with their blood sugar should be aware that this medication may cause you to have elevated blood sugar. This will typically occur for 24-36 hours after the injection. We recommend that you check your blood sugar regularly during this time and contact your primary care provider immediately or go to the nearest emergency room if the levels get to high.

## 2024-03-25 NOTE — Patient Instructions (Signed)
 The area should drain for a couple more days. Just keep a clean dry gauze over it until it stops.   If the area continues to drain you may be developing a fistula. This will need to be looked at and would involve surgery to repair. Give it about 6 weeks to try to heal first.    Follow-up with our office as needed.  Please call and ask to speak with a nurse if you develop questions or concerns.

## 2024-03-26 DIAGNOSIS — Z23 Encounter for immunization: Secondary | ICD-10-CM | POA: Diagnosis not present

## 2024-03-26 DIAGNOSIS — Z992 Dependence on renal dialysis: Secondary | ICD-10-CM | POA: Diagnosis not present

## 2024-03-26 DIAGNOSIS — N186 End stage renal disease: Secondary | ICD-10-CM | POA: Diagnosis not present

## 2024-03-27 DIAGNOSIS — Z992 Dependence on renal dialysis: Secondary | ICD-10-CM | POA: Diagnosis not present

## 2024-03-27 DIAGNOSIS — N186 End stage renal disease: Secondary | ICD-10-CM | POA: Diagnosis not present

## 2024-03-28 DIAGNOSIS — N186 End stage renal disease: Secondary | ICD-10-CM | POA: Diagnosis not present

## 2024-03-28 DIAGNOSIS — Z992 Dependence on renal dialysis: Secondary | ICD-10-CM | POA: Diagnosis not present

## 2024-03-29 ENCOUNTER — Encounter: Payer: Self-pay | Admitting: Radiology

## 2024-03-29 DIAGNOSIS — Z992 Dependence on renal dialysis: Secondary | ICD-10-CM | POA: Diagnosis not present

## 2024-03-29 DIAGNOSIS — N186 End stage renal disease: Secondary | ICD-10-CM | POA: Diagnosis not present

## 2024-03-30 DIAGNOSIS — Z992 Dependence on renal dialysis: Secondary | ICD-10-CM | POA: Diagnosis not present

## 2024-03-30 DIAGNOSIS — N186 End stage renal disease: Secondary | ICD-10-CM | POA: Diagnosis not present

## 2024-03-30 NOTE — Progress Notes (Unsigned)
 Referring Physician:  Liana Fish, NP [address] [place],  [place] [address]  Primary Physician:  Liana Fish, NP  History of Present Illness: 03/31/2024 [name] [name] is a 63 y.o with a history of  CAD, end stage renal disease on daily peritoneal dialysis, CHF, HTN, HLD, DM (A1C 7.7), COPD, hepatic steatosis who is here today with a chief complaint of neck pain. He was seen by neurosurgery for this in 2021 and at that time complained of left sided neck pain that radiated into the top of his left shoulder  Today he reports stiffness in his neck with associated pain about 1 month ago. He saw ortho and underwent a shoulder injection about a week ago which helped some with his shoulder pain but he continues left sided neck and radiating arm pain into his hand with associated numbness and tingling. He also endorses pain between his shoulder blades but denies any right radiating arm pain. He has chronic weakness and is current confined to a electric wheelchair and has been for about 3 years. He has chronic weakness but denies any acute worsening He has had 2 referrals for PT but states no one has called him to schedule.    Conservative measures:  Physical therapy: No recent PT. Awaiting referral Multimodal medical therapy including regular antiinflammatories:  Tylenol , Robaxin, Lidoderm  patch, oxycodone   Injections: No cervical epidural injections  Past Surgery: no previous cervical surgeries  Saw [facility] has questionable symptoms of myelopathy   The symptoms are causing a significant impact on the patient's life.   Review of Systems:  A 10 point review of systems is negative, except for the pertinent positives and negatives detailed in the HPI.  Past Medical History: Past Medical History:  Diagnosis Date   Anemia    Arthritis    CAD (coronary artery disease)    a. 12/2016 s/p CABG x 4 (LIMA->LAD, VG->RCA, VG->OM1, VG->D1); b. 02/2018 MV: EF 35%, small-med  inferolaterlal infarct. No ischemia.   CKD (chronic kidney disease), stage IV [facility])    Peritoneal Dialysis   Colon polyps    COPD (chronic obstructive pulmonary disease) (HCC)    Dupre's syndrome    GERD (gastroesophageal reflux disease)    HFrEF (heart failure reduced ejection fraction) (HCC)    a.) 2018 EF 35%; b.) 02/2018 EF 50%; c). 02/2019 EF 40-45%; d.) 08/2019 Echo: EF 50-55%, no rwma, GrII DD; e.) 04/2020 Echo: EF 30-35%, mod-sev glob HK. Mild LVH. G2DD. Low-nl RV fxn. Mildly dil LA. Mild MR. Mild-mod Ao sclerosis w/o stenosis; f.) TTE 02/09/2021: EF 55%; septal HK, LAE; G2DD.   History of 2019 novel coronavirus disease (COVID-19) 02/08/2021   Hyperlipidemia    Hypertension    Ischemic cardiomyopathy    a.) 02/2018 MV: EF 35%; b.) 08/2019 Echo: EF 50-55%; c.) 04/2020 Echo: EF 30-35%; d.) TTE 02/09/2021: EF 50-55%.   Lymphedema    Migraine    Myocardial infarction (HCC)    Neuropathy    PAD (peripheral artery disease)    a. 04/2017 Aortoiliac duplex: R iliac dzs; b. 03/2020 LE Duplex: mild bilat atherosclerosis throughout. Patent vessels.   Pneumonia    Retinopathy    S/P CABG x 4    a.)  4v CABG: LIMA->LAD, SVG->RCA, SVG->OM1, SVG->D1   Sleep apnea    a.) does not require nocturnal PAP therapy   Statin intolerance    Stomach ulcer    T2DM (type 2 diabetes mellitus) (HCC)     Past Surgical History: Past Surgical  History:  Procedure Laterality Date   bone graft surgery Bilateral    on both feet x 2   CAPD INSERTION N/A 11/13/2021   Procedure: LAPAROSCOPIC INSERTION CONTINUOUS AMBULATORY PERITONEAL DIALYSIS  (CAPD) CATHETER;  Surgeon: Jordis Laneta FALCON, MD;  Location: [facility] ORS;  Service: General;  Laterality: N/A;   CAPD REVISION N/A 01/10/2022   Procedure: LAPAROSCOPIC REVISION CONTINUOUS AMBULATORY PERITONEAL DIALYSIS  (CAPD) CATHETER;  Surgeon: Jordis Laneta FALCON, MD;  Location: [facility];  Service: General;  Laterality: N/A;   CARDIAC CATHETERIZATION  2013   S/p PCI     CARDIAC CATHETERIZATION  2018   S/p CABG   CATARACT EXTRACTION Bilateral    COLONOSCOPY     COLONOSCOPY WITH PROPOFOL  N/A 10/17/2022   Procedure: COLONOSCOPY WITH PROPOFOL ;  Surgeon: Jinny Carmine, MD;  Location: [facility];  Service: Endoscopy;  Laterality: N/A;   CORONARY ARTERY BYPASS GRAFT  2018   (LIMA-LAD,VG-RCA,VG-OM1,VG-D1)   ESOPHAGOGASTRODUODENOSCOPY (EGD) WITH PROPOFOL  N/A 04/04/2020   Procedure: ESOPHAGOGASTRODUODENOSCOPY (EGD) WITH PROPOFOL ;  Surgeon: Jinny Carmine, MD;  Location: [facility];  Service: Endoscopy;  Laterality: N/A;   ESOPHAGOGASTRODUODENOSCOPY (EGD) WITH PROPOFOL  N/A 06/06/2020   Procedure: ESOPHAGOGASTRODUODENOSCOPY (EGD) WITH PROPOFOL ;  Surgeon: Jinny Carmine, MD;  Location: [facility];  Service: Endoscopy;  Laterality: N/A;   ESOPHAGOGASTRODUODENOSCOPY (EGD) WITH PROPOFOL  N/A 09/24/2022   Procedure: ESOPHAGOGASTRODUODENOSCOPY (EGD) WITH PROPOFOL ;  Surgeon: Jinny Carmine, MD;  Location: [facility];  Service: Endoscopy;  Laterality: N/A;   PERIPHERAL VASCULAR CATHETERIZATION Right 10/28/2016   PTA/DEB Right SFA   WRIST SURGERY Left    cyst    Allergies: Allergies as of 03/31/2024 - Review Complete 03/31/2024  Allergen Reaction Noted   Iodinated contrast media Other (See Comments) 06/01/2020   Other Other (See Comments) 05/19/2019   Statins Other (See Comments) 05/19/2019   Pregabalin Other (See Comments) 05/31/2019   Sacubitril-valsartan Other (See Comments) 12/02/2019   Atorvastatin Other (See Comments) 05/31/2019   Pravastatin Other (See Comments) 05/31/2019   Rosuvastatin Other (See Comments) 12/09/2012    Medications: Outpatient Encounter Medications as of 03/31/2024  Medication Sig   albuterol  (VENTOLIN  HFA) 108 (90 Base) MCG/ACT inhaler Inhale 1-2 puffs into the lungs every 6 (six) hours as needed for wheezing or shortness of breath.   amLODipine  (NORVASC ) 10 MG tablet Take 1 tablet (10 mg total) by mouth daily.   aspirin  EC 81 MG  tablet Take 81 mg by mouth daily. Swallow whole.   carvedilol  (COREG ) 3.125 MG tablet Take 1 tablet (3.125 mg total) by mouth 2 (two) times daily with a meal.   cholecalciferol  (VITAMIN D3) 25 MCG (1000 UNIT) tablet Take 2,000 Units by mouth 2 (two) times daily.   cloNIDine  (CATAPRES ) 0.1 MG tablet Take 1 tablet (0.1 mg total) by mouth 2 (two) times daily as needed (pressure >175).   Evolocumab  (REPATHA  SURECLICK) 140 MG/ML SOAJ INJECT 1 ML UNDER THE SKIN EVERY 14 DAYS   ezetimibe  (ZETIA ) 10 MG tablet Take 1 tablet (10 mg total) by mouth daily.   HUMALOG  MIX 75/25 KWIKPEN (75-25) 100 UNIT/ML KwikPen Inject into the skin. 75-25 pen injectior   hydrALAZINE  (APRESOLINE ) 100 MG tablet Take 1 tablet (100 mg total) by mouth 3 (three) times daily.   insulin  lispro (HUMALOG ) 100 UNIT/ML KwikPen Inject into the skin.   Insulin  Pen Needle (B-D ULTRAFINE III SHORT PEN) 31G X 8 MM MISC To use with pen basal and mealtime coverage insulin  pens 5 times daily. DX: E11.65   isosorbide  mononitrate (IMDUR ) 30 MG 24  hr tablet Take 1 tablet (30 mg total) by mouth 2 (two) times daily.   lidocaine  (LIDODERM ) 5 % Place 1 patch onto the skin daily for 10 days. Remove & Discard patch within 12 hours or as directed by MD   methocarbamol (ROBAXIN) 500 MG tablet Take 1 tablet (500 mg total) by mouth every 8 (eight) hours as needed.   nitroGLYCERIN  (NITROSTAT ) 0.4 MG SL tablet Place 1 tablet (0.4 mg total) under the tongue every 5 (five) minutes as needed for chest pain.   nortriptyline  (PAMELOR ) 10 MG capsule Take 50 mg by mouth at bedtime.   pantoprazole  (PROTONIX ) 40 MG tablet Take 1 tablet (40 mg total) by mouth 2 (two) times daily before a meal.   torsemide  (DEMADEX ) 20 MG tablet Take 1 tablet (20 mg total) by mouth 2 (two) times daily.   TOUJEO  MAX SOLOSTAR 300 UNIT/ML Solostar Pen 60 Units daily.   No facility-administered encounter medications on file as of 03/31/2024.    Social History: Social History   Tobacco  Use   Smoking status: Every Day    Current packs/day: 0.25    Average packs/day: 0.3 packs/day for 39.0 years (9.8 ttl pk-yrs)    Types: Cigarettes    Passive exposure: Past   Smokeless tobacco: Never  Vaping Use   Vaping status: Never Used  Substance Use Topics   Alcohol use: Not Currently    Comment: very rarely - wine   Drug use: Never    Family Medical History: Family History  Problem Relation Age of Onset   Diabetes Mother    Heart disease Father     Physical Examination: Today's Vitals   03/31/24 1139  BP: 130/80  PainSc: 8   PainLoc: Neck   There is no height or weight on file to calculate BMI.   General: Patient is well developed, frail appearing.  Psychiatric: Patient is non-anxious.  Head:  Pupils equal, round, and reactive to light.  ENT:  Oral mucosa appears well hydrated.  Neck:   Supple.    Respiratory: Patient is breathing without any difficulty.  Extremities: No edema.  Vascular: Palpable dorsal pedal pulses.  Skin:   Multiple open sores on hands and fingers  NEUROLOGICAL:     Awake, alert, oriented to person, place, and time.  Speech is clear and fluent. Fund of knowledge is appropriate.   Cranial Nerves: Pupils equal round and reactive to light.  Facial tone is symmetric.  Facial sensation is symmetric.  ROM of spine: limited rotational movement in cervical spine  Strength: Side Biceps Triceps Deltoid Interossei Grip Wrist Ext. Wrist Flex.  R 5 5 5 3 3 5 5   L 5 3 5 3 3 5 5    Side Iliopsoas Quads Hamstring PF DF EHL  R 4 4 4 4 4 4   L 4 4 4 4 4 4    Reflexes are 1+ and symmetric at the biceps, triceps, brachioradialis, patella and achilles.   Hoffman's is absent.  Clonus is not present.  Toes are down-going.  Diffuse numbness throughout extremities.   Medical Decision Making  Imaging: 03/25/24 cervical xrays IMPRESSION: 1. Stable mild grade 1 retrolisthesis of C4-5, likely degenerative in etiology. 2. Severe degenerative disc  disease at C5-6. 3. Right carotid artery calcifications. Carotid ultrasound is recommended for further evaluation.   Electronically signed by: Lynwood Seip MD 03/28/2024 05:35 PM EST RP Workstation: [id number]  Assessment and Plan: [name] [name] is a pleasant 63 y.o. male with multiple co morbidities presenting today  with long standing neck and left shoulder pain. He has had 1 month of new left radiating arm pain that is severe in nature. His symptoms are concerning for cervical radiculopathy. I recommended further work up with a cervical MRI. I have sent a message to Dr. Gust as he placed a referral to [facility] for PT but the patient has not heard back. I will send him a message after his MRI has resulted to review the results and further plan of care. I reiterated our clinic policy regarding controlled substances and encouraged him to reach out to the provider providing his Oxycodone  prescription. He has a pain management referral pending.  He was encouraged to call the office in the meantime with any questions or concerns. He expressed understanding and was in agreement with this plan.    Thank you for involving me in the care of this patient.   I spent a total of 59 minutes in both face-to-face and non-face-to-face activities for this visit on the date of this encounter including review of imaging, review of records, discussion of symptoms, discussion of differential diagnosis, communication with outside provider .   [employer] [employer] Dept. of Neurosurgery

## 2024-03-31 ENCOUNTER — Telehealth: Payer: Self-pay

## 2024-03-31 ENCOUNTER — Encounter: Payer: Self-pay | Admitting: Neurosurgery

## 2024-03-31 ENCOUNTER — Ambulatory Visit: Admitting: Neurosurgery

## 2024-03-31 VITALS — BP 130/80

## 2024-03-31 DIAGNOSIS — M50122 Cervical disc disorder at C5-C6 level with radiculopathy: Secondary | ICD-10-CM | POA: Diagnosis not present

## 2024-03-31 DIAGNOSIS — M5010 Cervical disc disorder with radiculopathy, unspecified cervical region: Secondary | ICD-10-CM

## 2024-03-31 DIAGNOSIS — N186 End stage renal disease: Secondary | ICD-10-CM | POA: Diagnosis not present

## 2024-03-31 DIAGNOSIS — Z992 Dependence on renal dialysis: Secondary | ICD-10-CM | POA: Diagnosis not present

## 2024-03-31 NOTE — Telephone Encounter (Signed)
 Patient wife called asking for advice on pain medication for patient. She said that he was just sitting in his chair moaning and that he had taken two small oxycodone  pills, but was wondering if he could have more. Informed wife that we had not yet seen the patient nor had we prescribed his medication. Advised her to take medication as prescribed on the bottle and to reach out to whoever prescribed medication on advise for changing regimen. Told her that I was unable to advise at this time on pain regimen, but that there were over the counter medications she could utilize as long as she read the bottles. Wife states that she would reach out to provider and stated she hoped we prescribed something stronger at his appointment with us  today or she didn't know what they would do. Expressed sympathy and that I hoped we could help later.

## 2024-03-31 NOTE — Telephone Encounter (Signed)
 Pts wife called to report that pain is in a lot of pain from his neck but has a neurosurgery consult this morning. States that the pt has already taken 2 Oxycodone  but it is not helping. Per DFK, advised pt and his wife to attend the NS consult for advice and then to go to the ED if symptoms worsen.

## 2024-03-31 NOTE — Telephone Encounter (Signed)
 LVM for amy hyatt to get updated status on [facility] PT referral

## 2024-04-01 ENCOUNTER — Emergency Department

## 2024-04-01 ENCOUNTER — Emergency Department
Admission: EM | Admit: 2024-04-01 | Discharge: 2024-04-01 | Disposition: A | Discharge status: Home / Self Care | Attending: Emergency Medicine | Admitting: Emergency Medicine

## 2024-04-01 ENCOUNTER — Other Ambulatory Visit: Payer: Self-pay

## 2024-04-01 DIAGNOSIS — J90 Pleural effusion, not elsewhere classified: Secondary | ICD-10-CM | POA: Diagnosis not present

## 2024-04-01 DIAGNOSIS — I2489 Other forms of acute ischemic heart disease: Secondary | ICD-10-CM | POA: Insufficient documentation

## 2024-04-01 DIAGNOSIS — J810 Acute pulmonary edema: Secondary | ICD-10-CM | POA: Diagnosis not present

## 2024-04-01 DIAGNOSIS — Z4902 Encounter for fitting and adjustment of peritoneal dialysis catheter: Secondary | ICD-10-CM | POA: Insufficient documentation

## 2024-04-01 DIAGNOSIS — I120 Hypertensive chronic kidney disease with stage 5 chronic kidney disease or end stage renal disease: Secondary | ICD-10-CM | POA: Diagnosis not present

## 2024-04-01 DIAGNOSIS — R0602 Shortness of breath: Secondary | ICD-10-CM | POA: Diagnosis not present

## 2024-04-01 DIAGNOSIS — R778 Other specified abnormalities of plasma proteins: Secondary | ICD-10-CM | POA: Insufficient documentation

## 2024-04-01 DIAGNOSIS — D72829 Elevated white blood cell count, unspecified: Secondary | ICD-10-CM | POA: Diagnosis not present

## 2024-04-01 DIAGNOSIS — R7989 Other specified abnormal findings of blood chemistry: Secondary | ICD-10-CM

## 2024-04-01 DIAGNOSIS — R0600 Dyspnea, unspecified: Secondary | ICD-10-CM

## 2024-04-01 DIAGNOSIS — N186 End stage renal disease: Secondary | ICD-10-CM | POA: Insufficient documentation

## 2024-04-01 DIAGNOSIS — R0989 Other specified symptoms and signs involving the circulatory and respiratory systems: Secondary | ICD-10-CM | POA: Diagnosis not present

## 2024-04-01 DIAGNOSIS — J439 Emphysema, unspecified: Secondary | ICD-10-CM | POA: Diagnosis not present

## 2024-04-01 DIAGNOSIS — E1142 Type 2 diabetes mellitus with diabetic polyneuropathy: Secondary | ICD-10-CM | POA: Diagnosis not present

## 2024-04-01 DIAGNOSIS — J9600 Acute respiratory failure, unspecified whether with hypoxia or hypercapnia: Secondary | ICD-10-CM | POA: Diagnosis not present

## 2024-04-01 DIAGNOSIS — R059 Cough, unspecified: Secondary | ICD-10-CM | POA: Diagnosis not present

## 2024-04-01 DIAGNOSIS — D631 Anemia in chronic kidney disease: Secondary | ICD-10-CM | POA: Diagnosis not present

## 2024-04-01 DIAGNOSIS — Z992 Dependence on renal dialysis: Secondary | ICD-10-CM | POA: Diagnosis not present

## 2024-04-01 DIAGNOSIS — R918 Other nonspecific abnormal finding of lung field: Secondary | ICD-10-CM | POA: Diagnosis not present

## 2024-04-01 LAB — CBC WITH DIFFERENTIAL/PLATELET
Abs Immature Granulocytes: 0.07 K/uL (ref 0.00–0.07)
Basophils Absolute: 0.1 K/uL (ref 0.0–0.1)
Basophils Relative: 1 %
Eosinophils Absolute: 0.3 K/uL (ref 0.0–0.5)
Eosinophils Relative: 3 %
HCT: 37.7 % — ABNORMAL LOW (ref 39.0–52.0)
Hemoglobin: 12.4 g/dL — ABNORMAL LOW (ref 13.0–17.0)
Immature Granulocytes: 1 %
Lymphocytes Relative: 9 %
Lymphs Abs: 1.1 K/uL (ref 0.7–4.0)
MCH: 31.5 pg (ref 26.0–34.0)
MCHC: 32.9 g/dL (ref 30.0–36.0)
MCV: 95.7 fL (ref 80.0–100.0)
Monocytes Absolute: 0.9 K/uL (ref 0.1–1.0)
Monocytes Relative: 7 %
Neutro Abs: 9.5 K/uL — ABNORMAL HIGH (ref 1.7–7.7)
Neutrophils Relative %: 79 %
Platelets: 290 K/uL (ref 150–400)
RBC: 3.94 MIL/uL — ABNORMAL LOW (ref 4.22–5.81)
RDW: 14.6 % (ref 11.5–15.5)
WBC: 11.9 K/uL — ABNORMAL HIGH (ref 4.0–10.5)
nRBC: 0 % (ref 0.0–0.2)

## 2024-04-01 LAB — COMPREHENSIVE METABOLIC PANEL WITH GFR
ALT: 24 U/L (ref 0–44)
AST: 23 U/L (ref 15–41)
Albumin: 3.2 g/dL — ABNORMAL LOW (ref 3.5–5.0)
Alkaline Phosphatase: 181 U/L — ABNORMAL HIGH (ref 38–126)
Anion gap: 15 (ref 5–15)
BUN: 76 mg/dL — ABNORMAL HIGH (ref 8–23)
CO2: 25 mmol/L (ref 22–32)
Calcium: 8.6 mg/dL — ABNORMAL LOW (ref 8.9–10.3)
Chloride: 93 mmol/L — ABNORMAL LOW (ref 98–111)
Creatinine, Ser: 6.23 mg/dL — ABNORMAL HIGH (ref 0.61–1.24)
GFR, Estimated: 9 mL/min — ABNORMAL LOW (ref >60)
Glucose, Bld: 232 mg/dL — ABNORMAL HIGH (ref 70–99)
Potassium: 3.8 mmol/L (ref 3.5–5.1)
Sodium: 133 mmol/L — ABNORMAL LOW (ref 135–145)
Total Bilirubin: 0.9 mg/dL (ref 0.0–1.2)
Total Protein: 6.9 g/dL (ref 6.5–8.1)

## 2024-04-01 LAB — RESP PANEL BY RT-PCR (RSV, FLU A&B, COVID)  RVPGX2
Influenza A by PCR: NEGATIVE
Influenza B by PCR: NEGATIVE
Resp Syncytial Virus by PCR: NEGATIVE
SARS Coronavirus 2 by RT PCR: NEGATIVE

## 2024-04-01 LAB — TROPONIN I (HIGH SENSITIVITY)
Troponin I (High Sensitivity): 108 ng/L (ref <18)
Troponin I (High Sensitivity): 98 ng/L — ABNORMAL HIGH (ref <18)

## 2024-04-01 LAB — BRAIN NATRIURETIC PEPTIDE: B Natriuretic Peptide: 1856.5 pg/mL — ABNORMAL HIGH (ref 0.0–100.0)

## 2024-04-01 MED ORDER — FUROSEMIDE 10 MG/ML IJ SOLN
100.0000 mg | Freq: Once | INTRAVENOUS | Status: AC
  Administered 2024-04-01: 100 mg via INTRAVENOUS
  Filled 2024-04-01: qty 10

## 2024-04-01 NOTE — ED Triage Notes (Signed)
 Pt reports he became short of breath this evening, pt new cough or congestion. Hx copd and peritoneal dialysis. Denies chest pain feeling light headed or dizzy.

## 2024-04-01 NOTE — ED Notes (Signed)
 CCMD called at this time.

## 2024-04-01 NOTE — ED Provider Notes (Addendum)
 [facility] Provider Note    Event Date/Time   First MD Initiated Contact with Patient 04/01/24 0400     (approximate)   History   Shortness of Breath   HPI [name] [name] is a 63 y.o. male with an extensive medical history that includes but is not limited to poorly controlled diabetes, peritoneal dialysis due to chronic renal failure, what he describes as both central and peripheral neuropathy, prior quadruple bypass, and emphysema still smoking cigarettes though less than usual.  He presents tonight for evaluation of acute onset shortness of breath.  He states that the symptoms started earlier in the evening and then he went to bed and then woke up gasping for air.  He knows he has sleep apnea but this feels different.  He continues to be short of breath even after being awake and alert and sitting up straight.  He said that his weight has been stable.  He has not had any swelling in his extremities.  His abdomen is always swollen from the peritoneal dialysis.  He said he does the dialysis manually and has been functioning appropriately.  He also still produces urine and takes torsemide  and urinates a lot.  He has not had any chest pain.     Physical Exam   Triage Vital Signs: ED Triage Vitals  Encounter Vitals Group     BP 04/01/24 0350 (!) 155/81     Girls Systolic BP Percentile --      Girls Diastolic BP Percentile --      Boys Systolic BP Percentile --      Boys Diastolic BP Percentile --      Pulse Rate 04/01/24 0350 88     Resp 04/01/24 0350 19     Temp 04/01/24 0350 (!) 97.5 F (36.4 C)     Temp src --      SpO2 04/01/24 0350 98 %     Weight 04/01/24 0349 88.1 kg (194 lb 3.6 oz)     Height 04/01/24 0349 1.854 m (6' 1)     Head Circumference --      Peak Flow --      Pain Score 04/01/24 0349 0     Pain Loc --      Pain Education --      Exclude from Growth Chart --     Most recent vital signs: Vitals:   04/01/24 0600 04/01/24 0700   BP: (!) 160/86 (!) 155/85  Pulse: 90 90  Resp: (!) 22 17  Temp:    SpO2: 97% 100%    General: Awake, alert, conversant and pleasant. CV:  Good peripheral perfusion.  Regular rate and rhythm with normal heart sounds. Resp:  Normal effort but the patient is breathing more rapidly than normal.. Speaking easily and comfortably, no accessory muscle usage nor intercostal retractions.  Lungs are clear to auscultation bilaterally with no wheezing, rales, nor rhonchi, and with good air movement bilaterally. Abd:  Distention that the patient says is chronic.  No tenderness to palpation of the abdomen.   ED Results / Procedures / Treatments   Labs (all labs ordered are listed, but only abnormal results are displayed) Labs Reviewed  CBC WITH DIFFERENTIAL/PLATELET - Abnormal; Notable for the following components:      Result Value   WBC 11.9 (*)    RBC 3.94 (*)    Hemoglobin 12.4 (*)    HCT 37.7 (*)    Neutro Abs 9.5 (*)  All other components within normal limits  COMPREHENSIVE METABOLIC PANEL WITH GFR - Abnormal; Notable for the following components:   Sodium 133 (*)    Chloride 93 (*)    Glucose, Bld 232 (*)    BUN 76 (*)    Creatinine, Ser 6.23 (*)    Calcium  8.6 (*)    Albumin  3.2 (*)    Alkaline Phosphatase 181 (*)    GFR, Estimated 9 (*)    All other components within normal limits  BRAIN NATRIURETIC PEPTIDE - Abnormal; Notable for the following components:   B Natriuretic Peptide 1,856.5 (*)    All other components within normal limits  TROPONIN I (HIGH SENSITIVITY) - Abnormal; Notable for the following components:   Troponin I (High Sensitivity) 108 (*)    All other components within normal limits  RESP PANEL BY RT-PCR (RSV, FLU A&B, COVID)  [id number]  TROPONIN I (HIGH SENSITIVITY)     EKG  ED ECG REPORT I, Darleene Dome, the attending physician, personally viewed and interpreted this ECG.  Date: 04/01/2024 EKG Time: 4:09 AM Rate: 86 Rhythm: normal sinus rhythm with  nonspecific intraventricular conduction delay QRS Axis: normal Intervals: normal ST/T Wave abnormalities: Non-specific ST segment / T-wave changes, but no clear evidence of acute ischemia. Narrative Interpretation: no definitive evidence of acute ischemia; does not meet STEMI criteria.    RADIOLOGY See ED course for details   PROCEDURES:  Critical Care performed: No  .1-3 Lead EKG Interpretation  Performed by: Dome Darleene, MD Authorized by: Dome Darleene, MD     Interpretation: normal     ECG rate:  88   ECG rate assessment: normal     Rhythm: sinus rhythm     Ectopy: none     Conduction: normal   .Critical Care  Performed by: Dome Darleene, MD Authorized by: Dome Darleene, MD   Critical care provider statement:    Critical care time (minutes):  30   Critical care time was exclusive of:  Separately billable procedures and treating other patients   Critical care was necessary to treat or prevent imminent or life-threatening deterioration of the following conditions:  Renal failure, cardiac failure and circulatory failure   Critical care was time spent personally by me on the following activities:  Development of treatment plan with patient or surrogate, evaluation of patient's response to treatment, examination of patient, obtaining history from patient or surrogate, ordering and performing treatments and interventions, ordering and review of laboratory studies, ordering and review of radiographic studies, pulse oximetry, re-evaluation of patient's condition and review of old charts     IMPRESSION / MDM / ASSESSMENT AND PLAN / ED COURSE  I reviewed the triage vital signs and the nursing notes.                              Differential diagnosis includes, but is not limited to, pulmonary edema, new onset heart failure, pneumonia, pneumothorax, failure of peritoneal dialysis, ACS, PE.  Patient's presentation is most consistent with acute presentation with potential threat  to life or bodily function.  Labs/studies ordered: High sensitive troponin, respiratory viral panel, BNP, CMP, CBC with differential, 1 view chest x-ray, EKG  Interventions/Medications given:  Medications  furosemide  (LASIX ) 100 mg in dextrose  5 % 50 mL IVPB (100 mg Intravenous New Bag/Given 04/01/24 0713)    (Note:  hospital course my include additional interventions and/or labs/studies not listed above.)   Vitals are generally  reassuring and the patient is satting between 98 and 100% on room air.  Physical exam is also reassuring.  However the patient does clearly feel uncomfortable from acute dyspnea.  Lab work is pending and chest x-ray is pending.  Anticipate he may need a CTA to rule out PE, but I will reassess after initial evaluation is complete.  The patient is on the cardiac monitor to evaluate for evidence of arrhythmia and/or significant heart rate changes.   Clinical Course as of 04/01/24 9277  Thu Apr 01, 2024  0516 CBC with Differential(!) Generally reassuring CBC, minimal leukocytosis [CF]  0517 DG Chest Portable 1 View I independently viewed and interpreted the patient's chest x-ray(s), and I also reviewed the radiologist's report.  There is some general haziness that would suggest the possibility of developing edema which the radiologist agreed with.  The radiologist also pointed out a patchy opacity in the right lung base that is thought most likely to be atelectasis but infiltrate is not ruled out.  Given the nondiagnostic results and my desire to not miss an infectious process, I am ordering a CT chest without contrast to evaluate the lung parenchyma more carefully.  It is much more likely he is developing fluid overload and/or pneumonia than a pulmonary embolus particularly given his lack of hypoxia, lack of tachycardia, and lack of pain. [CF]  0607 Troponin I (High Sensitivity)(!!): 108 Troponin is elevated but the patient has end-stage renal disease on dialysis and  appears to be volume overloaded.  He has no chest pain and no ischemia on EKG and I do not think that this represents NSTEMI, but probably demand ischemia.  Awaiting results of CT chest. [CF]  9383 B Natriuretic Peptide(!): 1,856.5 Substantially elevated BNP.  Though I also want to rule out infection, I am going to give an extra dose of Lasix  to facilitate additional diuresis. [CF]  [id number] Reassessed the patient and he is comfortable.  I explained to him that he is volume overloaded with pulmonary edema and likely needs to be admitted because his peritoneal dialysis does not seem to be providing sufficient care.  He very much does not want to be admitted to the hospital.  His nephrologist is Dr. Dennise.  I agreed that I would call and speak with nephrology and ask if they had specific recommendations or if they agreed with my plan. [CF]  [id number] I consulted with Dr. Marcelino and we discussed the case in detail.  He is going to call Dr. Dennise and ask for recommendations given that this sounds like a failure of peritoneal dialysis. [CF]  0705 Dr. Marcelino contacted me and said that he and/or Dr. Dennise will come personally to the emergency department and evaluate the patient to determine whether the patient should be admitted or discharged.  I updated the patient and he agrees with this plan. [CF]  0719 During ED care to Dr. Claudene to follow-up on the nephrology recommendations after they evaluate the patient in person [CF]    Clinical Course User Index [CF] Gordan Huxley, MD     FINAL CLINICAL IMPRESSION(S) / ED DIAGNOSES   Final diagnoses:  Acute pulmonary edema (HCC)  Peritoneal dialysis catheter in place  Acute dyspnea  Elevated troponin level  Demand ischemia (HCC)     Rx / DC Orders   ED Discharge Orders     None        Note:  This document was prepared using Dragon voice recognition software and may include unintentional  dictation errors.   Gordan Huxley, MD 04/01/24 9278    Gordan Huxley, MD 04/01/24 [phone]

## 2024-04-01 NOTE — Discharge Instructions (Signed)
 Use the higher concentration bags with your PD over the next few days and reach out to nephrology for follow-up but return to the ED with any worsening symptoms.

## 2024-04-01 NOTE — Progress Notes (Signed)
 Outpatient Surgical Follow Up    [name] [name] is an 63 y.o. male.   Chief Complaint  Patient presents with   Follow-up    HPI: Patient returns status post incision and drainage of perirectal abscess.  He reports doing well.  He says there is still small amount of drainage from the area.  He denies any fevers or chills and says the pain is much improved.  Past Medical History:  Diagnosis Date   Anemia    Arthritis    CAD (coronary artery disease)    a. 12/2016 s/p CABG x 4 (LIMA->LAD, VG->RCA, VG->OM1, VG->D1); b. 02/2018 MV: EF 35%, small-med inferolaterlal infarct. No ischemia.   CKD (chronic kidney disease), stage IV [facility])    Peritoneal Dialysis   Colon polyps    COPD (chronic obstructive pulmonary disease) (HCC)    Dupre's syndrome    GERD (gastroesophageal reflux disease)    HFrEF (heart failure reduced ejection fraction) (HCC)    a.) 2018 EF 35%; b.) 02/2018 EF 50%; c). 02/2019 EF 40-45%; d.) 08/2019 Echo: EF 50-55%, no rwma, GrII DD; e.) 04/2020 Echo: EF 30-35%, mod-sev glob HK. Mild LVH. G2DD. Low-nl RV fxn. Mildly dil LA. Mild MR. Mild-mod Ao sclerosis w/o stenosis; f.) TTE 02/09/2021: EF 55%; septal HK, LAE; G2DD.   History of 2019 novel coronavirus disease (COVID-19) 02/08/2021   Hyperlipidemia    Hypertension    Ischemic cardiomyopathy    a.) 02/2018 MV: EF 35%; b.) 08/2019 Echo: EF 50-55%; c.) 04/2020 Echo: EF 30-35%; d.) TTE 02/09/2021: EF 50-55%.   Lymphedema    Migraine    Myocardial infarction (HCC)    Neuropathy    PAD (peripheral artery disease)    a. 04/2017 Aortoiliac duplex: R iliac dzs; b. 03/2020 LE Duplex: mild bilat atherosclerosis throughout. Patent vessels.   Pneumonia    Retinopathy    S/P CABG x 4    a.)  4v CABG: LIMA->LAD, SVG->RCA, SVG->OM1, SVG->D1   Sleep apnea    a.) does not require nocturnal PAP therapy   Statin intolerance    Stomach ulcer    T2DM (type 2 diabetes mellitus) (HCC)     Past Surgical History:  Procedure Laterality  Date   bone graft surgery Bilateral    on both feet x 2   CAPD INSERTION N/A 11/13/2021   Procedure: LAPAROSCOPIC INSERTION CONTINUOUS AMBULATORY PERITONEAL DIALYSIS  (CAPD) CATHETER;  Surgeon: Jordis Laneta FALCON, MD;  Location: [facility] ORS;  Service: General;  Laterality: N/A;   CAPD REVISION N/A 01/10/2022   Procedure: LAPAROSCOPIC REVISION CONTINUOUS AMBULATORY PERITONEAL DIALYSIS  (CAPD) CATHETER;  Surgeon: Jordis Laneta FALCON, MD;  Location: [facility] ORS;  Service: General;  Laterality: N/A;   CARDIAC CATHETERIZATION  2013   S/p PCI    CARDIAC CATHETERIZATION  2018   S/p CABG   CATARACT EXTRACTION Bilateral    COLONOSCOPY     COLONOSCOPY WITH PROPOFOL  N/A 10/17/2022   Procedure: COLONOSCOPY WITH PROPOFOL ;  Surgeon: Jinny Carmine, MD;  Location: [facility] ENDOSCOPY;  Service: Endoscopy;  Laterality: N/A;   CORONARY ARTERY BYPASS GRAFT  2018   (LIMA-LAD,VG-RCA,VG-OM1,VG-D1)   ESOPHAGOGASTRODUODENOSCOPY (EGD) WITH PROPOFOL  N/A 04/04/2020   Procedure: ESOPHAGOGASTRODUODENOSCOPY (EGD) WITH PROPOFOL ;  Surgeon: Jinny Carmine, MD;  Location: [facility] ENDOSCOPY;  Service: Endoscopy;  Laterality: N/A;   ESOPHAGOGASTRODUODENOSCOPY (EGD) WITH PROPOFOL  N/A 06/06/2020   Procedure: ESOPHAGOGASTRODUODENOSCOPY (EGD) WITH PROPOFOL ;  Surgeon: Jinny Carmine, MD;  Location: [facility] ENDOSCOPY;  Service: Endoscopy;  Laterality: N/A;   ESOPHAGOGASTRODUODENOSCOPY (EGD) WITH PROPOFOL  N/A 09/24/2022  Procedure: ESOPHAGOGASTRODUODENOSCOPY (EGD) WITH PROPOFOL ;  Surgeon: Jinny Carmine, MD;  Location: [facility] ENDOSCOPY;  Service: Endoscopy;  Laterality: N/A;   PERIPHERAL VASCULAR CATHETERIZATION Right 10/28/2016   PTA/DEB Right SFA   WRIST SURGERY Left    cyst    Family History  Problem Relation Age of Onset   Diabetes Mother    Heart disease Father     Social History:  reports that he has been smoking cigarettes. He has a 9.8 pack-year smoking history. He has been exposed to tobacco smoke. He has never used smokeless tobacco. He reports that  he does not currently use alcohol. He reports that he does not use drugs.  Allergies:  Allergies  Allergen Reactions   Iodinated Contrast Media Other (See Comments)    Kidney disease  Kidney disease  Kidney disease  Kidney disease   Other Other (See Comments)    Pain  With joint stiffness     Statins Other (See Comments)    Pain  With joint stiffness   Pain  With joint stiffness  Other Reaction(s): Joint Pain   Pregabalin Other (See Comments)    Generalized aches and pains Generalized aches and pains Generalized aches and pains Generalized aches and pains Generalized aches and pains   Sacubitril-Valsartan Other (See Comments)    Hyperkalemia  Hyperkalemia Hyperkalemia  Hyperkalemia   Atorvastatin Other (See Comments)    Muscle aches Muscle aches   Pravastatin Other (See Comments)    Muscle aches Muscle aches   Rosuvastatin Other (See Comments)    Muscle Aches Other reaction(s): JOINT PAIN Other reaction(s): JOINT PAIN Muscle Aches     Medications reviewed.    ROS Full ROS performed and is otherwise negative other than what is stated in HPI   BP (!) 148/75   Pulse 79   Ht 6' 2 (1.88 m)   Wt 187 lb (84.8 kg)   SpO2 98%   BMI 24.01 kg/m   Physical Exam  Rectal exam performed in the presence of a chaperone.  At the site of incision and drainage there is still small amount of drainage from the area.  It looks seropurulent.  The erythema has improved greatly.   No results found for this or any previous visit (from the past 48 hours). CT Chest Wo Contrast Result Date: 04/01/2024 EXAM: CT CHEST WITHOUT CONTRAST 04/01/2024 06:12:34 AM TECHNIQUE: CT of the chest was performed without the administration of intravenous contrast. Multiplanar reformatted images are provided for review. Automated exposure control, iterative reconstruction, and/or weight based adjustment of the mA/kV was utilized to reduce the radiation dose to as low as reasonably achievable.  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 CLINICAL HISTORY: Respiratory illness, nondiagnostic xray. FINDINGS: MEDIASTINUM: Mild cardiac enlargement. Aortic atherosclerosis. No pericardial effusion. The central airways are clear. LYMPH NODES: Similar appearance of increased multiplicity of prominent mediastinal lymph nodes. Lymph nodes measure up to 1.3 cm in short axis, unchanged from prior examination. This is a nonspecific finding in the setting of CHF. Hilar lymph nodes suboptimally evaluated due to lack of IV contrast. No axillary lymphadenopathy. LUNGS AND PLEURA: Small to moderate right pleural effusion and small left pleural effusion. Mild interstitial thickening is identified with a lower lung zone predominance concerning for mild edema. Emphysema with diffuse bronchial wall thickening. Subsegmental atelectasis noted within the posterior right base. Unchanged scarring within the right middle lobe. Posterior left upper lobe subpleural nodule is unchanged, measuring 4 mm (image 25/3). Subpleural nodule in the anterior right upper lobe is  unchanged, measuring 3 mm (image 48/3). Right lower lobe lung nodule is unchanged, measuring 5 mm. These are compatible with a benign process requiring no further follow-up. No pneumothorax. SOFT TISSUES/BONES: Status post median sternotomy. No acute abnormality of the bones or soft tissues. UPPER ABDOMEN: Upper abdominal ascites noted. IMPRESSION: 1. Mild interstitial thickening with lower lung zone predominance, compatible with mild pulmonary edema. 2. Small to moderate right pleural effusion and small left pleural effusion. 3. Upper abdominal ascites. 4. Small scattered lung nodules are unchanged from the previous exam and require no further follow-up. Electronically signed by: Waddell Calk MD 04/01/2024 06:38 AM EST RP Workstation: [id number]   DG Chest Portable 1 View Result Date: 04/01/2024 EXAM: 1 VIEW(S) XRAY OF THE CHEST 04/01/2024 04:25:43 AM  COMPARISON: 03/24/2023 CLINICAL HISTORY: 63 year old male with acute dyspnea and history of peritoneal dialysis. FINDINGS: LUNGS AND PLEURA: Low lung volumes. Patchy right lung base opacity most resembles atelectasis. Increased pulmonary interstitium elsewhere, mild or developing interstitial edema not excluded. No pleural effusion. No pneumothorax. HEART AND MEDIASTINUM: Prior sternotomy. Mediastinal contours are stable and within normal limits. Calcified aortic atherosclerosis. BONES AND SOFT TISSUES: Prior sternotomy. No acute osseous abnormality. IMPRESSION: 1. Mild or developing interstitial edema cannot be excluded. 2. Patchy right lung base opacity most resembles atelectasis. Electronically signed by: Helayne Hurst MD 04/01/2024 05:09 AM EST RP Workstation: [id number]    Assessment/Plan:  Patient status post I&D of perirectal abscess.  It looks like it is healing well and the erythema certainly improved.  I did discuss with him that if it continues to drain he may have a fistula.  If it is draining in 6 weeks he will call back and we will see him for more thorough exam.  Continue fiber supplementation.   Jayson Endow, M.D. [facility]

## 2024-04-01 NOTE — Progress Notes (Signed)
 [facility]  ROUNDING NOTE   Subjective:   [name] [name] is a 63 year old male with past medical conditions including hypertension, poorly controlled diabetes with neuropathy, GERD, COPD, CAD, and end-stage renal disease on peritoneal dialysis.Patient presents to the ED with shortness of breath. States he's been having shortness of breath for a couple days, but really increased yesterday. Spouse at bedside states they have been completed dialysis treatments daily with yellow bags and extraneal. States he hasn't had any lower extremity edema or weight gain to indicate they needed a stronger dialysate.   Patient is followed by [facility], by Dr Jimmie.   Serologies are appropriate however chest xray shows interstitial edema.   We have been consulted to assist in management.   Objective:  Vital signs in last 24 hours:  Temp:  [97.5 F (36.4 C)] 97.5 F (36.4 C) (11/06 0350) Pulse Rate:  [83-92] 89 (11/06 0900) Resp:  [12-23] 18 (11/06 0900) BP: (130-170)/(76-97) 170/97 (11/06 0900) SpO2:  [93 %-100 %] 95 % (11/06 0900) Weight:  [88.1 kg] 88.1 kg (11/06 0349)  Weight change:  Filed Weights   04/01/24 0349  Weight: 88.1 kg    Intake/Output: No intake/output data recorded.   Intake/Output this shift:  Total I/O In: 50 [IV Piggyback:50] Out: -   Physical Exam: General: NAD  Head: Normocephalic, atraumatic. Moist oral mucosal membranes  Eyes: Anicteric  Lungs:  Crackles, normal effort, room air  Heart: Regular rate and rhythm  Abdomen:  Soft, nontender  Extremities:  No peripheral edema.  Neurologic: Awake, alert, conversant  Skin: Warm,dry, no rash  Access: [facility]    Basic Metabolic Panel: Recent Labs  Lab 04/01/24 0443  NA 133*  K 3.8  CL 93*  CO2 25  GLUCOSE 232*  BUN 76*  CREATININE 6.23*  CALCIUM  8.6*    Liver Function Tests: Recent Labs  Lab 04/01/24 0443  AST 23  ALT 24  ALKPHOS 181*  BILITOT 0.9  PROT 6.9  ALBUMIN  3.2*   No  results for input(s): LIPASE, AMYLASE in the last 168 hours. No results for input(s): AMMONIA in the last 168 hours.  CBC: Recent Labs  Lab 04/01/24 0443  WBC 11.9*  NEUTROABS 9.5*  HGB 12.4*  HCT 37.7*  MCV 95.7  PLT 290    Cardiac Enzymes: No results for input(s): CKTOTAL, CKMB, CKMBINDEX, TROPONINI in the last 168 hours.  BNP: Invalid input(s): POCBNP  CBG: No results for input(s): GLUCAP in the last 168 hours.  Microbiology: Results for orders placed or performed during the hospital encounter of 04/01/24  Resp panel by RT-PCR (RSV, Flu A&B, Covid) Anterior Nasal Swab     Status: None   Collection Time: 04/01/24  4:43 AM   Specimen: Anterior Nasal Swab  Result Value Ref Range Status   SARS Coronavirus 2 by RT PCR NEGATIVE NEGATIVE Final    Comment: (NOTE) SARS-CoV-2 target nucleic acids are NOT DETECTED.  The SARS-CoV-2 RNA is generally detectable in upper respiratory specimens during the acute phase of infection. The lowest concentration of SARS-CoV-2 viral copies this assay can detect is 138 copies/mL. A negative result does not preclude SARS-Cov-2 infection and should not be used as the sole basis for treatment or other patient management decisions. A negative result may occur with  improper specimen collection/handling, submission of specimen other than nasopharyngeal swab, presence of viral mutation(s) within the areas targeted by this assay, and inadequate number of viral copies(<138 copies/mL). A negative result must be combined with clinical  observations, patient history, and epidemiological information. The expected result is Negative.  Fact Sheet for Patients:  bloggercourse.com  Fact Sheet for Healthcare Providers:  seriousbroker.it  This test is no t yet approved or cleared by the [place]  FDA and  has been authorized for detection and/or diagnosis of SARS-CoV-2 by FDA under  an Emergency Use Authorization (EUA). This EUA will remain  in effect (meaning this test can be used) for the duration of the COVID-19 declaration under Section 564(b)(1) of the Act, 21 U.S.C.section 360bbb-3(b)(1), unless the authorization is terminated  or revoked sooner.       Influenza A by PCR NEGATIVE NEGATIVE Final   Influenza B by PCR NEGATIVE NEGATIVE Final    Comment: (NOTE) The Xpert Xpress SARS-CoV-2/FLU/RSV plus assay is intended as an aid in the diagnosis of influenza from Nasopharyngeal swab specimens and should not be used as a sole basis for treatment. Nasal washings and aspirates are unacceptable for Xpert Xpress SARS-CoV-2/FLU/RSV testing.  Fact Sheet for Patients: bloggercourse.com  Fact Sheet for Healthcare Providers: seriousbroker.it  This test is not yet approved or cleared by the [place]  FDA and has been authorized for detection and/or diagnosis of SARS-CoV-2 by FDA under an Emergency Use Authorization (EUA). This EUA will remain in effect (meaning this test can be used) for the duration of the COVID-19 declaration under Section 564(b)(1) of the Act, 21 U.S.C. section 360bbb-3(b)(1), unless the authorization is terminated or revoked.     Resp Syncytial Virus by PCR NEGATIVE NEGATIVE Final    Comment: (NOTE) Fact Sheet for Patients: bloggercourse.com  Fact Sheet for Healthcare Providers: seriousbroker.it  This test is not yet approved or cleared by the [place]  FDA and has been authorized for detection and/or diagnosis of SARS-CoV-2 by FDA under an Emergency Use Authorization (EUA). This EUA will remain in effect (meaning this test can be used) for the duration of the COVID-19 declaration under Section 564(b)(1) of the Act, 21 U.S.C. section 360bbb-3(b)(1), unless the authorization is terminated or revoked.  Performed at [facility], [address] [place], [place] [address]     Coagulation Studies: No results for input(s): LABPROT, INR in the last 72 hours.  Urinalysis: No results for input(s): COLORURINE, LABSPEC, PHURINE, GLUCOSEU, HGBUR, BILIRUBINUR, KETONESUR, PROTEINUR, UROBILINOGEN, NITRITE, LEUKOCYTESUR in the last 72 hours.  Invalid input(s): APPERANCEUR    Imaging: CT Chest Wo Contrast Result Date: 04/01/2024 EXAM: CT CHEST WITHOUT CONTRAST 04/01/2024 06:12:34 AM TECHNIQUE: CT of the chest was performed without the administration of intravenous contrast. Multiplanar reformatted images are provided for review. Automated exposure control, iterative reconstruction, and/or weight based adjustment of the mA/kV was utilized to reduce the radiation dose to as low as reasonably achievable. 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 CLINICAL HISTORY: Respiratory illness, nondiagnostic xray. FINDINGS: MEDIASTINUM: Mild cardiac enlargement. Aortic atherosclerosis. No pericardial effusion. The central airways are clear. LYMPH NODES: Similar appearance of increased multiplicity of prominent mediastinal lymph nodes. Lymph nodes measure up to 1.3 cm in short axis, unchanged from prior examination. This is a nonspecific finding in the setting of CHF. Hilar lymph nodes suboptimally evaluated due to lack of IV contrast. No axillary lymphadenopathy. LUNGS AND PLEURA: Small to moderate right pleural effusion and small left pleural effusion. Mild interstitial thickening is identified with a lower lung zone predominance concerning for mild edema. Emphysema with diffuse bronchial wall thickening. Subsegmental atelectasis noted within the posterior right base. Unchanged scarring within the right middle lobe. Posterior left upper lobe subpleural nodule  is unchanged, measuring 4 mm (image 25/3). Subpleural nodule in the anterior right upper lobe is unchanged, measuring 3 mm (image 48/3). Right lower  lobe lung nodule is unchanged, measuring 5 mm. These are compatible with a benign process requiring no further follow-up. No pneumothorax. SOFT TISSUES/BONES: Status post median sternotomy. No acute abnormality of the bones or soft tissues. UPPER ABDOMEN: Upper abdominal ascites noted. IMPRESSION: 1. Mild interstitial thickening with lower lung zone predominance, compatible with mild pulmonary edema. 2. Small to moderate right pleural effusion and small left pleural effusion. 3. Upper abdominal ascites. 4. Small scattered lung nodules are unchanged from the previous exam and require no further follow-up. Electronically signed by: Waddell Calk MD 04/01/2024 06:38 AM EST RP Workstation: [id number]   DG Chest Portable 1 View Result Date: 04/01/2024 EXAM: 1 VIEW(S) XRAY OF THE CHEST 04/01/2024 04:25:43 AM COMPARISON: 03/24/2023 CLINICAL HISTORY: 63 year old male with acute dyspnea and history of peritoneal dialysis. FINDINGS: LUNGS AND PLEURA: Low lung volumes. Patchy right lung base opacity most resembles atelectasis. Increased pulmonary interstitium elsewhere, mild or developing interstitial edema not excluded. No pleural effusion. No pneumothorax. HEART AND MEDIASTINUM: Prior sternotomy. Mediastinal contours are stable and within normal limits. Calcified aortic atherosclerosis. BONES AND SOFT TISSUES: Prior sternotomy. No acute osseous abnormality. IMPRESSION: 1. Mild or developing interstitial edema cannot be excluded. 2. Patchy right lung base opacity most resembles atelectasis. Electronically signed by: Helayne Hurst MD 04/01/2024 05:09 AM EST RP Workstation: [id number]     Medications:       Assessment/ Plan:  [name] is a 63 y.o.  male with past medical conditions including hypertension, poorly controlled diabetes with neuropathy, GERD, COPD, CAD, and end-stage renal disease on peritoneal dialysis.Patient presents to the ED with shortness of breath.    Acute respiratory failure, chest  xray suspicious for interstitial edema. Remains on room air. Encouraged to use dialysate 2.5% with dialysis for 2-3 days for increased fluid removal  2. End stage renal disease on peritoneal dialysis. CCAPD, 2 cycles and extended extroneal dwell. Patient encouraged to use 2.5% dialysate for 2-3 days and continue extroneal. Would like to see and average 1.5L fluid removal daily. PD clinic notified and will followup with patient in 2-3 days.   3. Anemia of chronic kidney disease Lab Results  Component Value Date   HGB 12.4 (L) 04/01/2024    Hgb stable  4. 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 Home regimen includes Humalog  and lispro. Most recent hemoglobin A1c is 6.6 on 11/20/23.      LOS: 0 [name] [name] 11/6/202510:19 AM

## 2024-04-01 NOTE — ED Provider Notes (Signed)
 Patient seen and evaluated by nephrology.  Patient continues to refuse medical admission and so nephrology recommends PD at home with higher concentration dialysate bags the moderate strength 2.5x over the next couple days and following up with nephrology as an outpatient.  Discussed ED return precautions and patient is suitable for outpatient management.   Claudene Rover, MD 04/01/24 [phone]

## 2024-04-01 NOTE — Telephone Encounter (Signed)
 I havent heard back. Do you mind emailing her to check on this please?

## 2024-04-02 ENCOUNTER — Ambulatory Visit

## 2024-04-02 DIAGNOSIS — N186 End stage renal disease: Secondary | ICD-10-CM | POA: Diagnosis not present

## 2024-04-02 DIAGNOSIS — Z992 Dependence on renal dialysis: Secondary | ICD-10-CM | POA: Diagnosis not present

## 2024-04-03 DIAGNOSIS — Z992 Dependence on renal dialysis: Secondary | ICD-10-CM | POA: Diagnosis not present

## 2024-04-03 DIAGNOSIS — N186 End stage renal disease: Secondary | ICD-10-CM | POA: Diagnosis not present

## 2024-04-04 DIAGNOSIS — N186 End stage renal disease: Secondary | ICD-10-CM | POA: Diagnosis not present

## 2024-04-04 DIAGNOSIS — Z992 Dependence on renal dialysis: Secondary | ICD-10-CM | POA: Diagnosis not present

## 2024-04-05 DIAGNOSIS — Z992 Dependence on renal dialysis: Secondary | ICD-10-CM | POA: Diagnosis not present

## 2024-04-05 DIAGNOSIS — D649 Anemia, unspecified: Secondary | ICD-10-CM | POA: Diagnosis not present

## 2024-04-05 DIAGNOSIS — N186 End stage renal disease: Secondary | ICD-10-CM | POA: Diagnosis not present

## 2024-04-05 DIAGNOSIS — N250 Renal osteodystrophy: Secondary | ICD-10-CM | POA: Diagnosis not present

## 2024-04-06 ENCOUNTER — Telehealth: Payer: Self-pay | Admitting: *Deleted

## 2024-04-06 ENCOUNTER — Ambulatory Visit
Admission: RE | Admit: 2024-04-06 | Discharge: 2024-04-06 | Disposition: A | Discharge status: Home / Self Care | Source: Ambulatory Visit | Attending: Neurosurgery | Admitting: Neurosurgery

## 2024-04-06 DIAGNOSIS — M47812 Spondylosis without myelopathy or radiculopathy, cervical region: Secondary | ICD-10-CM | POA: Diagnosis not present

## 2024-04-06 DIAGNOSIS — N186 End stage renal disease: Secondary | ICD-10-CM | POA: Diagnosis not present

## 2024-04-06 DIAGNOSIS — M5021 Other cervical disc displacement,  high cervical region: Secondary | ICD-10-CM | POA: Diagnosis not present

## 2024-04-06 DIAGNOSIS — M5010 Cervical disc disorder with radiculopathy, unspecified cervical region: Secondary | ICD-10-CM | POA: Insufficient documentation

## 2024-04-06 DIAGNOSIS — M50221 Other cervical disc displacement at C4-C5 level: Secondary | ICD-10-CM | POA: Diagnosis not present

## 2024-04-06 DIAGNOSIS — Z992 Dependence on renal dialysis: Secondary | ICD-10-CM | POA: Diagnosis not present

## 2024-04-06 DIAGNOSIS — M4802 Spinal stenosis, cervical region: Secondary | ICD-10-CM | POA: Diagnosis not present

## 2024-04-06 NOTE — Telephone Encounter (Signed)
 Referral emailed to [facility]

## 2024-04-06 NOTE — Telephone Encounter (Signed)
 Lvm advising pt this is being worked on. Thank you both for your assistance in this

## 2024-04-06 NOTE — Telephone Encounter (Signed)
 Just received email from [facility] they are OON with his insurance. I will send to [facility].

## 2024-04-07 DIAGNOSIS — N186 End stage renal disease: Secondary | ICD-10-CM | POA: Diagnosis not present

## 2024-04-07 DIAGNOSIS — Z992 Dependence on renal dialysis: Secondary | ICD-10-CM | POA: Diagnosis not present

## 2024-04-08 ENCOUNTER — Emergency Department

## 2024-04-08 ENCOUNTER — Observation Stay
Admission: EM | Admit: 2024-04-08 | Discharge: 2024-04-11 | Disposition: A | Discharge status: Home Health | Attending: Internal Medicine | Admitting: Internal Medicine

## 2024-04-08 ENCOUNTER — Ambulatory Visit: Admitting: Gastroenterology

## 2024-04-08 ENCOUNTER — Telehealth: Payer: Self-pay

## 2024-04-08 ENCOUNTER — Inpatient Hospital Stay

## 2024-04-08 ENCOUNTER — Ambulatory Visit: Payer: Self-pay

## 2024-04-08 ENCOUNTER — Other Ambulatory Visit: Payer: Self-pay

## 2024-04-08 DIAGNOSIS — M50221 Other cervical disc displacement at C4-C5 level: Secondary | ICD-10-CM | POA: Diagnosis not present

## 2024-04-08 DIAGNOSIS — F419 Anxiety disorder, unspecified: Secondary | ICD-10-CM | POA: Diagnosis not present

## 2024-04-08 DIAGNOSIS — M4622 Osteomyelitis of vertebra, cervical region: Principal | ICD-10-CM | POA: Insufficient documentation

## 2024-04-08 DIAGNOSIS — M25532 Pain in left wrist: Secondary | ICD-10-CM | POA: Diagnosis not present

## 2024-04-08 DIAGNOSIS — E785 Hyperlipidemia, unspecified: Secondary | ICD-10-CM | POA: Diagnosis not present

## 2024-04-08 DIAGNOSIS — N186 End stage renal disease: Secondary | ICD-10-CM | POA: Insufficient documentation

## 2024-04-08 DIAGNOSIS — M47812 Spondylosis without myelopathy or radiculopathy, cervical region: Secondary | ICD-10-CM | POA: Diagnosis not present

## 2024-04-08 DIAGNOSIS — M542 Cervicalgia: Principal | ICD-10-CM

## 2024-04-08 DIAGNOSIS — I5022 Chronic systolic (congestive) heart failure: Secondary | ICD-10-CM | POA: Diagnosis present

## 2024-04-08 DIAGNOSIS — F418 Other specified anxiety disorders: Secondary | ICD-10-CM | POA: Diagnosis present

## 2024-04-08 DIAGNOSIS — Z794 Long term (current) use of insulin: Secondary | ICD-10-CM | POA: Diagnosis not present

## 2024-04-08 DIAGNOSIS — J439 Emphysema, unspecified: Secondary | ICD-10-CM

## 2024-04-08 DIAGNOSIS — E1151 Type 2 diabetes mellitus with diabetic peripheral angiopathy without gangrene: Secondary | ICD-10-CM | POA: Insufficient documentation

## 2024-04-08 DIAGNOSIS — I10 Essential (primary) hypertension: Secondary | ICD-10-CM | POA: Diagnosis not present

## 2024-04-08 DIAGNOSIS — E1140 Type 2 diabetes mellitus with diabetic neuropathy, unspecified: Secondary | ICD-10-CM | POA: Insufficient documentation

## 2024-04-08 DIAGNOSIS — Z7982 Long term (current) use of aspirin: Secondary | ICD-10-CM | POA: Insufficient documentation

## 2024-04-08 DIAGNOSIS — K610 Anal abscess: Secondary | ICD-10-CM | POA: Diagnosis present

## 2024-04-08 DIAGNOSIS — E1122 Type 2 diabetes mellitus with diabetic chronic kidney disease: Secondary | ICD-10-CM | POA: Diagnosis not present

## 2024-04-08 DIAGNOSIS — I6782 Cerebral ischemia: Secondary | ICD-10-CM | POA: Diagnosis not present

## 2024-04-08 DIAGNOSIS — Z992 Dependence on renal dialysis: Secondary | ICD-10-CM | POA: Insufficient documentation

## 2024-04-08 DIAGNOSIS — M9971 Connective tissue and disc stenosis of intervertebral foramina of cervical region: Secondary | ICD-10-CM | POA: Insufficient documentation

## 2024-04-08 DIAGNOSIS — I132 Hypertensive heart and chronic kidney disease with heart failure and with stage 5 chronic kidney disease, or end stage renal disease: Secondary | ICD-10-CM | POA: Diagnosis not present

## 2024-04-08 DIAGNOSIS — F17210 Nicotine dependence, cigarettes, uncomplicated: Secondary | ICD-10-CM | POA: Insufficient documentation

## 2024-04-08 DIAGNOSIS — M4802 Spinal stenosis, cervical region: Secondary | ICD-10-CM | POA: Insufficient documentation

## 2024-04-08 DIAGNOSIS — K219 Gastro-esophageal reflux disease without esophagitis: Secondary | ICD-10-CM | POA: Diagnosis not present

## 2024-04-08 DIAGNOSIS — F32A Depression, unspecified: Secondary | ICD-10-CM | POA: Diagnosis not present

## 2024-04-08 DIAGNOSIS — M4646 Discitis, unspecified, lumbar region: Secondary | ICD-10-CM | POA: Diagnosis not present

## 2024-04-08 DIAGNOSIS — J449 Chronic obstructive pulmonary disease, unspecified: Secondary | ICD-10-CM | POA: Insufficient documentation

## 2024-04-08 DIAGNOSIS — Z79899 Other long term (current) drug therapy: Secondary | ICD-10-CM | POA: Diagnosis not present

## 2024-04-08 DIAGNOSIS — G8929 Other chronic pain: Secondary | ICD-10-CM | POA: Insufficient documentation

## 2024-04-08 DIAGNOSIS — M4312 Spondylolisthesis, cervical region: Secondary | ICD-10-CM | POA: Diagnosis not present

## 2024-04-08 DIAGNOSIS — I2510 Atherosclerotic heart disease of native coronary artery without angina pectoris: Secondary | ICD-10-CM | POA: Diagnosis not present

## 2024-04-08 DIAGNOSIS — I7090 Unspecified atherosclerosis: Secondary | ICD-10-CM | POA: Diagnosis not present

## 2024-04-08 DIAGNOSIS — R531 Weakness: Secondary | ICD-10-CM | POA: Diagnosis not present

## 2024-04-08 DIAGNOSIS — R1310 Dysphagia, unspecified: Secondary | ICD-10-CM | POA: Insufficient documentation

## 2024-04-08 DIAGNOSIS — M4642 Discitis, unspecified, cervical region: Secondary | ICD-10-CM

## 2024-04-08 DIAGNOSIS — M19032 Primary osteoarthritis, left wrist: Secondary | ICD-10-CM | POA: Diagnosis not present

## 2024-04-08 DIAGNOSIS — Z951 Presence of aortocoronary bypass graft: Secondary | ICD-10-CM | POA: Insufficient documentation

## 2024-04-08 DIAGNOSIS — E1142 Type 2 diabetes mellitus with diabetic polyneuropathy: Secondary | ICD-10-CM

## 2024-04-08 DIAGNOSIS — M720 Palmar fascial fibromatosis [Dupuytren]: Secondary | ICD-10-CM | POA: Insufficient documentation

## 2024-04-08 LAB — COMPREHENSIVE METABOLIC PANEL WITH GFR
ALT: 21 U/L (ref 0–44)
AST: 19 U/L (ref 15–41)
Albumin: 3.3 g/dL — ABNORMAL LOW (ref 3.5–5.0)
Alkaline Phosphatase: 169 U/L — ABNORMAL HIGH (ref 38–126)
Anion gap: 14 (ref 5–15)
BUN: 58 mg/dL — ABNORMAL HIGH (ref 8–23)
CO2: 27 mmol/L (ref 22–32)
Calcium: 9.2 mg/dL (ref 8.9–10.3)
Chloride: 94 mmol/L — ABNORMAL LOW (ref 98–111)
Creatinine, Ser: 6.49 mg/dL — ABNORMAL HIGH (ref 0.61–1.24)
GFR, Estimated: 9 mL/min — ABNORMAL LOW (ref >60)
Glucose, Bld: 159 mg/dL — ABNORMAL HIGH (ref 70–99)
Potassium: 4 mmol/L (ref 3.5–5.1)
Sodium: 135 mmol/L (ref 135–145)
Total Bilirubin: 0.4 mg/dL (ref 0.0–1.2)
Total Protein: 6.6 g/dL (ref 6.5–8.1)

## 2024-04-08 LAB — CBC WITH DIFFERENTIAL/PLATELET
Abs Immature Granulocytes: 0.05 K/uL (ref 0.00–0.07)
Basophils Absolute: 0.1 K/uL (ref 0.0–0.1)
Basophils Relative: 1 %
Eosinophils Absolute: 0.3 K/uL (ref 0.0–0.5)
Eosinophils Relative: 3 %
HCT: 36.1 % — ABNORMAL LOW (ref 39.0–52.0)
Hemoglobin: 11.9 g/dL — ABNORMAL LOW (ref 13.0–17.0)
Immature Granulocytes: 1 %
Lymphocytes Relative: 12 %
Lymphs Abs: 1.2 K/uL (ref 0.7–4.0)
MCH: 31.9 pg (ref 26.0–34.0)
MCHC: 33 g/dL (ref 30.0–36.0)
MCV: 96.8 fL (ref 80.0–100.0)
Monocytes Absolute: 0.7 K/uL (ref 0.1–1.0)
Monocytes Relative: 7 %
Neutro Abs: 8.2 K/uL — ABNORMAL HIGH (ref 1.7–7.7)
Neutrophils Relative %: 76 %
Platelets: 291 K/uL (ref 150–400)
RBC: 3.73 MIL/uL — ABNORMAL LOW (ref 4.22–5.81)
RDW: 14.3 % (ref 11.5–15.5)
WBC: 10.5 K/uL (ref 4.0–10.5)
nRBC: 0 % (ref 0.0–0.2)

## 2024-04-08 LAB — APTT: aPTT: 34 s (ref 24–36)

## 2024-04-08 LAB — HIV ANTIBODY (ROUTINE TESTING W REFLEX): HIV Screen 4th Generation wRfx: NONREACTIVE

## 2024-04-08 LAB — PROTIME-INR
INR: 1.1 (ref 0.8–1.2)
Prothrombin Time: 15.1 s (ref 11.4–15.2)

## 2024-04-08 LAB — GLUCOSE, CAPILLARY: Glucose-Capillary: 180 mg/dL — ABNORMAL HIGH (ref 70–99)

## 2024-04-08 LAB — LACTIC ACID, PLASMA
Lactic Acid, Venous: 1.1 mmol/L (ref 0.5–1.9)
Lactic Acid, Venous: 1.2 mmol/L (ref 0.5–1.9)

## 2024-04-08 MED ORDER — ACETAMINOPHEN 325 MG PO TABS: 650.0000 mg | ORAL_TABLET | Freq: Four times a day (QID) | ORAL | Status: DC | PRN

## 2024-04-08 MED ORDER — DM-GUAIFENESIN ER 30-600 MG PO TB12: 1.0000 | ORAL_TABLET | Freq: Two times a day (BID) | ORAL | Status: DC | PRN

## 2024-04-08 MED ORDER — ALBUTEROL SULFATE HFA 108 (90 BASE) MCG/ACT IN AERS: 2.0000 | INHALATION_SPRAY | RESPIRATORY_TRACT | Status: DC | PRN

## 2024-04-08 MED ORDER — INSULIN ASPART 100 UNIT/ML IJ SOLN: 0.0000 [IU] | Freq: Every day | INTRAMUSCULAR | Status: DC

## 2024-04-08 MED ORDER — INSULIN ASPART 100 UNIT/ML IJ SOLN: 0.0000 [IU] | Freq: Three times a day (TID) | INTRAMUSCULAR | Status: DC

## 2024-04-08 MED ORDER — ONDANSETRON HCL 4 MG/2ML IJ SOLN: 4.0000 mg | Freq: Three times a day (TID) | INTRAMUSCULAR | Status: DC | PRN

## 2024-04-08 MED ORDER — OXYCODONE-ACETAMINOPHEN 5-325 MG PO TABS
1.0000 | ORAL_TABLET | ORAL | Status: DC | PRN
  Administered 2024-04-08 – 2024-04-10 (×4): 1 via ORAL
  Filled 2024-04-08 (×4): qty 1

## 2024-04-08 MED ORDER — HYDRALAZINE HCL 20 MG/ML IJ SOLN: 10.0000 mg | INTRAMUSCULAR | Status: DC | PRN

## 2024-04-08 MED ORDER — ALBUTEROL SULFATE (2.5 MG/3ML) 0.083% IN NEBU: 2.5000 mg | INHALATION_SOLUTION | RESPIRATORY_TRACT | Status: DC | PRN

## 2024-04-08 MED ORDER — NICOTINE 21 MG/24HR TD PT24
21.0000 mg | MEDICATED_PATCH | Freq: Every day | TRANSDERMAL | Status: DC
  Filled 2024-04-08 (×2): qty 1

## 2024-04-08 NOTE — Telephone Encounter (Signed)
 Patient called and states that he will be going to the ER within the next hour or so.

## 2024-04-08 NOTE — H&P (Signed)
 History and Physical    [name] [name] FMW:[id number] DOB: [birth year] DOA: 04/08/2024  Referring MD/NP/PA:   PCP: Liana Fish, NP   Patient coming from:  The patient is coming from home.     Chief Complaint: neck pain  HPI: [name] [name] is a 63 y.o. 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   Pt states that he has posterior neck pain for more than 6 weeks.  Patient was seen by neurosurgeon who ordered MRI of the C-spine. The results of MRI showed possible acute osteomyelitis discitis at the C4-5 level.  Patient is sent to ED for further evaluation and treatment by neurosurgeon.  Patient does not have fever or chills. He has mild neck pain now.  No loss control of bladder or bowel movement.  Patient has chronic neuropathy in all extremities which has not changed. Patient has chronic mild cough due to COPD which has not changed.  Currently no chest pain and SOB.  No nausea, vomiting, diarrhea or abdominal pain.  He still makes urine, no symptoms of UTI.  Patient states that he does peritoneal dialysis twice a day and nightly for 12 hours in the night.  Pt states that he had perianal abscess which was drained several weeks ago by careers adviser.  He also reports left wrist pain for about 1 week.  He has a small wound in the the left wrist, with surrounding erythema.  MRI-C spin on 04/06/24 1. Findings concerning for acute osteomyelitis discitis at the C4-5 level. Associated paraspinous edema without loculated collection. No visible epidural involvement. 2. Similar but more mild changes about the C5-6 level, also potentially involved. 3. Multilevel cervical spondylosis with resultant mild to moderate spinal stenosis at C4-5 and C5-6.  Severe left with moderate right C5 foraminal narrowing. 4. Right pleural effusion, partially visualized.   Data reviewed independently and ED Course: pt was found to have WBC 10.5, lactic acid 1.1 -> 1.2, K 4.0. Temperature normal, blood pressure 159/83, heart rate 88, RR 17, oxygen saturation 99% on room air.  Chest x-ray negative.  X-ray of left wrist is negative for bony fracture.  Patient is admitted to telemetry bed as inpatient.  Consulted Dr. Douglas of renal, Dr. Fayette of ID and Dr. Claudene of neurosurgery.   EKG: I have personally reviewed.  Sinus rhythm, QTc 535, LAD, ST elevation in V1-V3, Q wave in lead III (patient does not have any chest pain)   Review of Systems:   General: no fevers, chills, no body weight gain, fatigue HEENT: no blurry vision, hearing changes or sore throat Respiratory: no dyspnea, has coughing, no wheezing CV: no chest pain, no palpitations GI: no nausea, vomiting, abdominal pain, diarrhea, constipation GU: no dysuria, burning on urination, increased urinary frequency, hematuria  Ext: no leg edema Neuro: no unilateral weakness, numbness, or tingling, no vision change or hearing loss Skin: has small wound in left wrist MSK: No muscle spasm, no deformity, no limitation of range of movement in spin. Has neck pain and left wrist pain Heme: No easy bruising.  Travel history: No recent long distant travel.   Allergy:  Allergies  Allergen Reactions   Iodinated Contrast Media Other (See Comments)    Kidney disease  Kidney disease  Kidney disease  Kidney disease   Other Other (  See Comments)    Pain  With joint stiffness     Statins Other (See Comments)    Pain  With joint stiffness   Pain  With joint stiffness  Other Reaction(s): Joint Pain   Pregabalin Other (See Comments)    Generalized aches and pains Generalized aches and pains Generalized aches and pains Generalized aches and pains Generalized aches and pains   Sacubitril-Valsartan  Other (See Comments)    Hyperkalemia  Hyperkalemia Hyperkalemia  Hyperkalemia   Atorvastatin Other (See Comments)    Muscle aches Muscle aches   Pravastatin Other (See Comments)    Muscle aches Muscle aches   Rosuvastatin Other (See Comments)    Muscle Aches Other reaction(s): JOINT PAIN Other reaction(s): JOINT PAIN Muscle Aches     Past Medical History:  Diagnosis Date   Anemia    Arthritis    CAD (coronary artery disease)    a. 12/2016 s/p CABG x 4 (LIMA->LAD, VG->RCA, VG->OM1, VG->D1); b. 02/2018 MV: EF 35%, small-med inferolaterlal infarct. No ischemia.   CKD (chronic kidney disease), stage IV [facility])    Peritoneal Dialysis   Colon polyps    COPD (chronic obstructive pulmonary disease) (HCC)    Dupre's syndrome    GERD (gastroesophageal reflux disease)    HFrEF (heart failure reduced ejection fraction) (HCC)    a.) 2018 EF 35%; b.) 02/2018 EF 50%; c). 02/2019 EF 40-45%; d.) 08/2019 Echo: EF 50-55%, no rwma, GrII DD; e.) 04/2020 Echo: EF 30-35%, mod-sev glob HK. Mild LVH. G2DD. Low-nl RV fxn. Mildly dil LA. Mild MR. Mild-mod Ao sclerosis w/o stenosis; f.) TTE 02/09/2021: EF 55%; septal HK, LAE; G2DD.   History of 2019 novel coronavirus disease (COVID-19) 02/08/2021   Hyperlipidemia    Hypertension    Ischemic cardiomyopathy    a.) 02/2018 MV: EF 35%; b.) 08/2019 Echo: EF 50-55%; c.) 04/2020 Echo: EF 30-35%; d.) TTE 02/09/2021: EF 50-55%.   Lymphedema    Migraine    Myocardial infarction (HCC)    Neuropathy    PAD (peripheral artery disease)    a. 04/2017 Aortoiliac duplex: R iliac dzs; b. 03/2020 LE Duplex: mild bilat atherosclerosis throughout. Patent vessels.   Pneumonia    Retinopathy    S/P CABG x 4    a.)  4v CABG: LIMA->LAD, SVG->RCA, SVG->OM1, SVG->D1   Sleep apnea    a.) does not require nocturnal PAP therapy   Statin intolerance    Stomach ulcer    T2DM (type 2 diabetes mellitus) (HCC)     Past Surgical History:  Procedure Laterality Date   bone graft  surgery Bilateral    on both feet x 2   CAPD INSERTION N/A 11/13/2021   Procedure: LAPAROSCOPIC INSERTION CONTINUOUS AMBULATORY PERITONEAL DIALYSIS  (CAPD) CATHETER;  Surgeon: Jordis Laneta FALCON, MD;  Location: [facility];  Service: General;  Laterality: N/A;   CAPD REVISION N/A 01/10/2022   Procedure: LAPAROSCOPIC REVISION CONTINUOUS AMBULATORY PERITONEAL DIALYSIS  (CAPD) CATHETER;  Surgeon: Jordis Laneta FALCON, MD;  Location: [facility];  Service: General;  Laterality: N/A;   CARDIAC CATHETERIZATION  2013   S/p PCI    CARDIAC CATHETERIZATION  2018   S/p CABG   CATARACT EXTRACTION Bilateral    COLONOSCOPY     COLONOSCOPY WITH PROPOFOL  N/A 10/17/2022   Procedure: COLONOSCOPY WITH PROPOFOL ;  Surgeon: Jinny Carmine, MD;  Location: [facility];  Service: Endoscopy;  Laterality: N/A;   CORONARY ARTERY BYPASS GRAFT  2018   (LIMA-LAD,VG-RCA,VG-OM1,VG-D1)   ESOPHAGOGASTRODUODENOSCOPY (EGD) WITH PROPOFOL   N/A 04/04/2020   Procedure: ESOPHAGOGASTRODUODENOSCOPY (EGD) WITH PROPOFOL ;  Surgeon: Jinny Carmine, MD;  Location: [facility];  Service: Endoscopy;  Laterality: N/A;   ESOPHAGOGASTRODUODENOSCOPY (EGD) WITH PROPOFOL  N/A 06/06/2020   Procedure: ESOPHAGOGASTRODUODENOSCOPY (EGD) WITH PROPOFOL ;  Surgeon: Jinny Carmine, MD;  Location: [facility];  Service: Endoscopy;  Laterality: N/A;   ESOPHAGOGASTRODUODENOSCOPY (EGD) WITH PROPOFOL  N/A 09/24/2022   Procedure: ESOPHAGOGASTRODUODENOSCOPY (EGD) WITH PROPOFOL ;  Surgeon: Jinny Carmine, MD;  Location: [facility];  Service: Endoscopy;  Laterality: N/A;   PERIPHERAL VASCULAR CATHETERIZATION Right 10/28/2016   PTA/DEB Right SFA   WRIST SURGERY Left    cyst    Social History:  reports that he has been smoking cigarettes. He has a 9.8 pack-year smoking history. He has been exposed to tobacco smoke. He has never used smokeless tobacco. He reports that he does not currently use alcohol. He reports that he does not use drugs.  Family History:  Family History   Problem Relation Age of Onset   Diabetes Mother    Heart disease Father      Prior to Admission medications   Medication Sig Start Date End Date Taking? Authorizing Provider  albuterol  (VENTOLIN  HFA) 108 (90 Base) MCG/ACT inhaler Inhale 1-2 puffs into the lungs every 6 (six) hours as needed for wheezing or shortness of breath. 04/15/23   Liana Fish, NP  amLODipine  (NORVASC ) 10 MG tablet Take 1 tablet (10 mg total) by mouth daily. 02/16/24   Gollan, Timothy J, MD  aspirin  EC 81 MG tablet Take 81 mg by mouth daily. Swallow whole.    [provider]  carvedilol  (COREG ) 3.125 MG tablet Take 1 tablet (3.125 mg total) by mouth 2 (two) times daily with a meal. 02/16/24   Gollan, Evalene PARAS, MD  cholecalciferol  (VITAMIN D3) 25 MCG (1000 UNIT) tablet Take 2,000 Units by mouth 2 (two) times daily.    [provider]  cloNIDine  (CATAPRES ) 0.1 MG tablet Take 1 tablet (0.1 mg total) by mouth 2 (two) times daily as needed (pressure >175). 02/16/24   Gollan, Timothy J, MD  Evolocumab  (REPATHA  SURECLICK) 140 MG/ML SOAJ INJECT 1 ML UNDER THE SKIN EVERY 14 DAYS 02/16/24   Gollan, Timothy J, MD  ezetimibe  (ZETIA ) 10 MG tablet Take 1 tablet (10 mg total) by mouth daily. 02/16/24   Gollan, Timothy J, MD  HUMALOG  MIX 75/25 KWIKPEN (75-25) 100 UNIT/ML KwikPen Inject into the skin. 75-25 pen injectior    [provider]  hydrALAZINE  (APRESOLINE ) 100 MG tablet Take 1 tablet (100 mg total) by mouth 3 (three) times daily. 02/16/24 02/10/25  Gollan, Timothy J, MD  insulin  lispro (HUMALOG ) 100 UNIT/ML KwikPen Inject into the skin.    [provider]  Insulin  Pen Needle (B-D ULTRAFINE III SHORT PEN) 31G X 8 MM MISC To use with pen basal and mealtime coverage insulin  pens 5 times daily. DX: E11.65 04/17/20   Hanford Powell BRAVO, NP  isosorbide  mononitrate (IMDUR ) 30 MG 24 hr tablet Take 1 tablet (30 mg total) by mouth 2 (two) times daily. 02/16/24   Gollan, Timothy J, MD  methocarbamol  (ROBAXIN) 500 MG tablet Take 1 tablet (500 mg total) by mouth every 8 (eight) hours as needed. 03/23/24   Ward, Josette SAILOR, DO  nitroGLYCERIN  (NITROSTAT ) 0.4 MG SL tablet Place 1 tablet (0.4 mg total) under the tongue every 5 (five) minutes as needed for chest pain. 02/16/24   Gollan, Timothy J, MD  nortriptyline  (PAMELOR ) 10 MG capsule Take 50 mg by mouth at bedtime.  [provider]  pantoprazole  (PROTONIX ) 40 MG tablet Take 1 tablet (40 mg total) by mouth 2 (two) times daily before a meal. 11/19/23   Abernathy, Mardy, NP  torsemide  (DEMADEX ) 20 MG tablet Take 1 tablet (20 mg total) by mouth 2 (two) times daily. 02/16/24   Gollan, Timothy J, MD  TOUJEO  MAX SOLOSTAR 300 UNIT/ML Solostar Pen 60 Units daily.    [provider]    Physical Exam: Vitals:   04/08/24 1900 04/08/24 1915 04/08/24 2035 04/09/24 0051  BP: (!) 162/85  (!) 146/78 (!) 145/73  Pulse: 91 91 92 87  Resp:  18 19 16   Temp:   97.7 F (36.5 C) 98.2 F (36.8 C)  TempSrc:      SpO2:   95% 95%  Weight:      Height:       General: Not in acute distress HEENT:       Eyes: PERRL, EOMI, no jaundice       ENT: No discharge from the ears and nose, no pharynx injection, no tonsillar enlargement.        Neck: No JVD, no bruit, no mass felt. Heme: No neck lymph node enlargement. Cardiac: S1/S2, RRR, No murmurs, No gallops or rubs. Respiratory: No rales, wheezing, rhonchi or rubs. GI: Soft, nondistended, nontender, no rebound pain, no organomegaly, BS present. GU: No hematuria Ext: No pitting leg edema bilaterally. 1+DP/PT pulse bilaterally. Musculoskeletal: No joint deformities, No joint redness or warmth, no limitation of ROM in spin. Skin: Has a small wound in left wrist with surrounding erythema, no active drainage, with tenderness, no warmth.   Neuro: Alert, oriented X3, cranial nerves II-XII grossly intact, moves all extremities normally. Has mild tenderness in posterior neck Psych: Patient is not  psychotic, no suicidal or hemocidal ideation.  Labs on Admission: I have personally reviewed following labs and imaging studies  CBC: Recent Labs  Lab 04/08/24 1421  WBC 10.5  NEUTROABS 8.2*  HGB 11.9*  HCT 36.1*  MCV 96.8  PLT 291   Basic Metabolic Panel: Recent Labs  Lab 04/08/24 1421  NA 135  K 4.0  CL 94*  CO2 27  GLUCOSE 159*  BUN 58*  CREATININE 6.49*  CALCIUM  9.2   GFR: Estimated Creatinine Clearance: 13 mL/min (A) (by C-G formula based on SCr of 6.49 mg/dL (H)). Liver Function Tests: Recent Labs  Lab 04/08/24 1421  AST 19  ALT 21  ALKPHOS 169*  BILITOT 0.4  PROT 6.6  ALBUMIN  3.3*   No results for input(s): LIPASE, AMYLASE in the last 168 hours. No results for input(s): AMMONIA in the last 168 hours. Coagulation Profile: Recent Labs  Lab 04/08/24 2057  INR 1.1   Cardiac Enzymes: No results for input(s): CKTOTAL, CKMB, CKMBINDEX, TROPONINI in the last 168 hours. BNP (last 3 results) No results for input(s): PROBNP in the last 8760 hours. HbA1C: No results for input(s): HGBA1C in the last 72 hours. CBG: Recent Labs  Lab 04/08/24 2128  GLUCAP 180*   Lipid Profile: No results for input(s): CHOL, HDL, LDLCALC, TRIG, CHOLHDL, LDLDIRECT in the last 72 hours. Thyroid  Function Tests: No results for input(s): TSH, T4TOTAL, FREET4, T3FREE, THYROIDAB in the last 72 hours. Anemia Panel: No results for input(s): VITAMINB12, FOLATE, FERRITIN, TIBC, IRON, RETICCTPCT in the last 72 hours. Urine analysis:    Component Value Date/Time   COLORURINE YELLOW (A) 08/31/2020 1942   APPEARANCEUR CLEAR (A) 08/31/2020 1942   LABSPEC 1.012 08/31/2020 1942   PHURINE 7.0  08/31/2020 1942   GLUCOSEU >=500 (A) 08/31/2020 1942   HGBUR MODERATE (A) 08/31/2020 1942   BILIRUBINUR NEGATIVE 08/31/2020 1942   KETONESUR 5 (A) 08/31/2020 1942   PROTEINUR >=300 (A) 08/31/2020 1942   NITRITE NEGATIVE 08/31/2020 1942    LEUKOCYTESUR NEGATIVE 08/31/2020 1942   Sepsis Labs: @LABRCNTIP (procalcitonin:4,lacticidven:4) ) Recent Results (from the past 240 hours)  Resp panel by RT-PCR (RSV, Flu A&B, Covid) Anterior Nasal Swab     Status: None   Collection Time: 04/01/24  4:43 AM   Specimen: Anterior Nasal Swab  Result Value Ref Range Status   SARS Coronavirus 2 by RT PCR NEGATIVE NEGATIVE Final    Comment: (NOTE) SARS-CoV-2 target nucleic acids are NOT DETECTED.  The SARS-CoV-2 RNA is generally detectable in upper respiratory specimens during the acute phase of infection. The lowest concentration of SARS-CoV-2 viral copies this assay can detect is 138 copies/mL. A negative result does not preclude SARS-Cov-2 infection and should not be used as the sole basis for treatment or other patient management decisions. A negative result may occur with  improper specimen collection/handling, submission of specimen other than nasopharyngeal swab, presence of viral mutation(s) within the areas targeted by this assay, and inadequate number of viral copies(<138 copies/mL). A negative result must be combined with clinical observations, patient history, and epidemiological information. The expected result is Negative.  Fact Sheet for Patients:  bloggercourse.com  Fact Sheet for Healthcare Providers:  seriousbroker.it  This test is no t yet approved or cleared by the [place]  FDA and  has been authorized for detection and/or diagnosis of SARS-CoV-2 by FDA under an Emergency Use Authorization (EUA). This EUA will remain  in effect (meaning this test can be used) for the duration of the COVID-19 declaration under Section 564(b)(1) of the Act, 21 U.S.C.section 360bbb-3(b)(1), unless the authorization is terminated  or revoked sooner.       Influenza A by PCR NEGATIVE NEGATIVE Final   Influenza B by PCR NEGATIVE NEGATIVE Final    Comment: (NOTE) The Xpert Xpress  SARS-CoV-2/FLU/RSV plus assay is intended as an aid in the diagnosis of influenza from Nasopharyngeal swab specimens and should not be used as a sole basis for treatment. Nasal washings and aspirates are unacceptable for Xpert Xpress SARS-CoV-2/FLU/RSV testing.  Fact Sheet for Patients: bloggercourse.com  Fact Sheet for Healthcare Providers: seriousbroker.it  This test is not yet approved or cleared by the [place]  FDA and has been authorized for detection and/or diagnosis of SARS-CoV-2 by FDA under an Emergency Use Authorization (EUA). This EUA will remain in effect (meaning this test can be used) for the duration of the COVID-19 declaration under Section 564(b)(1) of the Act, 21 U.S.C. section 360bbb-3(b)(1), unless the authorization is terminated or revoked.     Resp Syncytial Virus by PCR NEGATIVE NEGATIVE Final    Comment: (NOTE) Fact Sheet for Patients: bloggercourse.com  Fact Sheet for Healthcare Providers: seriousbroker.it  This test is not yet approved or cleared by the [place]  FDA and has been authorized for detection and/or diagnosis of SARS-CoV-2 by FDA under an Emergency Use Authorization (EUA). This EUA will remain in effect (meaning this test can be used) for the duration of the COVID-19 declaration under Section 564(b)(1) of the Act, 21 U.S.C. section 360bbb-3(b)(1), unless the authorization is terminated or revoked.  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  Clinically not septic.  Consulted with Dr. Fayette of ID, who recommended MRI with contrast and possible IR aspiration biopsy.  ED physician talked to IR, but they do not biopsy the cervical spine due to high risk. EDP then consulted Dr. Claudene of neurosurgery.  -Admitted to telemetry bed as inpatient - Hold off antibiotics now - MRI of C-spine with and without contrast was ordered by EDP, but the MRI was done without contrast. I called radiologist ,and was told that the MRI was pre-terminated since patient could not tolerate. -blood culture.   Left wrist pain: Has a small wound in left wrist with surrounding erythema, with tenderness, suspecting infection.  X-ray of left wrist  is negative for bony fracture. - Follow-up MRI of left wrist to rule out osteomyelitis.  Essential hypertension:  - IV hydralazine  as needed - Amlodipine  oral hydralazine , Imdur , torsemide   ESRD on peritoneal dialysis [facility]): -consulted Dr .  Douglas for dialysis  HLD (hyperlipidemia) -Zetia  - Patient is getting Repatha  injection  History of CAD (coronary artery disease): EKG showed ST elevation in V1-V3, but patient does not have chest pain. -Hold aspirin  in case patient needs procedure - Zetia   Chronic systolic heart failure (HCC): Today: 03/25/2023 showed a EF of 35-40% with grade 3 diastolic dysfunction.  Patient does not have leg edema.  CHF seem to be compensated. -Volume management per renal by dialysis  COPD (chronic obstructive pulmonary disease) (HCC): Stable -Bronchodilators as needed Mucinex   Depression with anxiety: - Hold nortriptyline  due to QTc prolongation 535  Diabetes mellitus with renal complication: Recent A1c 6.6, well-controlled.  I  ordered sliding scale insulin , but patient strongly wants to take his own insulin  in his own way. Pt is taking Humalog  75/25 mix 20 units in the am 45 min to 1 hour before his daily peritoneal dialysis, Humalog  R for meal coverage and Toujeo  Max 60 units daily between 8-9 am.  -will allow pt to take his own insulin     DVT ppx: SCD  Code Status: Full code   Family Communication:    Yes, patient's wife  at bed side.     Disposition Plan:  Anticipate discharge back to previous environment  Consults called:  Consulted Dr. Douglas of renal, Dr. Fayette of ID and Dr. Claudene of neurosurgery.  Admission status and Level of care: Telemetry:    for obs as inpt        Dispo: The patient is from: Home              Anticipated d/c is to: Home              Anticipated d/c date is: 2 days              Patient currently is not medically stable to d/c.    Severity of Illness:  The appropriate patient status for this patient is INPATIENT. Inpatient status is judged to be reasonable and necessary in order to provide the required intensity of service to ensure the  patient's safety. The patient's presenting symptoms, physical exam findings, and initial radiographic and laboratory data in the context of their chronic comorbidities is felt to place them at high risk for further clinical deterioration. Furthermore, it is not anticipated that the patient will be medically stable for discharge from the hospital within 2 midnights of admission.   * I certify that at the point of admission it is my clinical judgment that the patient will require inpatient hospital care spanning beyond 2 midnights from the point of admission due to high intensity of service, high risk for further deterioration and high frequency of surveillance required.*       Date of Service 04/09/2024    Caleb Exon [facility]   If 7PM-7AM, please contact night-coverage [email or web address] 04/09/2024, 1:43 AM

## 2024-04-08 NOTE — Telephone Encounter (Signed)
 I spoke to patient and he states he has an appointment today with Gastroenterology at 2:00 that has been changed multiple times and will go to that appointment then he will go to the ED. I expressed the concern and he understands the importance but will still go to [facility] them the ED.

## 2024-04-08 NOTE — ED Triage Notes (Signed)
 Pt arrives via POV after receiving a call from their neurosurgeon because of their MRI that they had last week for a possible infection, pt and support person were not real clear on details just told that they needed to come to the ER ASAP. Pt is A&Ox4 ambulatory during triage.

## 2024-04-08 NOTE — Telephone Encounter (Signed)
 Left message to return call

## 2024-04-08 NOTE — Telephone Encounter (Signed)
 Elenor, CMA spoke with the patient (see telephone encounter). He agreed to go to the ER this afternoon after his GI appointment.

## 2024-04-08 NOTE — Telephone Encounter (Signed)
 Channing called from [facility] Radiology in regards to MRI C spine, most important is #1 under impression  IMPRESSION: 1. Findings concerning for acute osteomyelitis discitis at the C4-5 level. Associated paraspinous edema without loculated collection. No visible epidural involvement. 2. Similar but more mild changes about the C5-6 level, also potentially involved. 3. Multilevel cervical spondylosis with resultant mild to moderate spinal stenosis at C4-5 and C5-6. Severe left with moderate right C5 foraminal narrowing. 4. Right pleural effusion, partially visualized  Message sent to [facility] as well

## 2024-04-08 NOTE — Telephone Encounter (Signed)
 Dr. Clois reviewed his imaging.   Please let him know the scan showed concern for infection and he needs to go to [facility] ED for further evaluation.

## 2024-04-08 NOTE — ED Provider Notes (Signed)
 [facility] Provider Note    Event Date/Time   First MD Initiated Contact with Patient 04/08/24 1619     (approximate)   History   possible infection   HPI  [name] [name] is a 63 y.o. male who presents to the transfer department today at the advice of his neurosurgeons because of concerning MRI cervical spine findings.  The patient has been dealing with stiff and painful neck for about a month and a half.  Had MRI performed 2 days ago which was read as possible osteomyelitis/discitis of the cervical spine.  In addition the patient has complaints of possible left hand infection as well.  He thinks it could be related to the neck since the pain went from his neck down his arm.     Physical Exam   Triage Vital Signs: ED Triage Vitals  Encounter Vitals Group     BP 04/08/24 1420 (!) 152/82     Girls Systolic BP Percentile --      Girls Diastolic BP Percentile --      Boys Systolic BP Percentile --      Boys Diastolic BP Percentile --      Pulse Rate 04/08/24 1420 86     Resp 04/08/24 1420 16     Temp 04/08/24 1420 97.9 F (36.6 C)     Temp Source 04/08/24 1420 Oral     SpO2 04/08/24 1420 99 %     Weight 04/08/24 1421 185 lb 6.5 oz (84.1 kg)     Height 04/08/24 1421 6' 1 (1.854 m)     Head Circumference --      Peak Flow --      Pain Score 04/08/24 1420 0     Pain Loc --      Pain Education --      Exclude from Growth Chart --     Most recent vital signs: Vitals:   04/08/24 1420  BP: (!) 152/82  Pulse: 86  Resp: 16  Temp: 97.9 F (36.6 C)  SpO2: 99%   General: Awake, alert, oriented. CV:  Good peripheral perfusion. Regular rate and rhythm. Systolic murmur. Resp:  Normal effort. Lungs clear. Abd:  No distention.  Other:  Left hand with small roughly 1 cm diameter wound to proximal palmar surface with some surrounding erythema.   ED Results / Procedures / Treatments   Labs (all labs ordered are listed, but only abnormal results  are displayed) Labs Reviewed  COMPREHENSIVE METABOLIC PANEL WITH GFR - Abnormal; Notable for the following components:      Result Value   Chloride 94 (*)    Glucose, Bld 159 (*)    BUN 58 (*)    Creatinine, Ser 6.49 (*)    Albumin  3.3 (*)    Alkaline Phosphatase 169 (*)    GFR, Estimated 9 (*)    All other components within normal limits  CBC WITH DIFFERENTIAL/PLATELET - Abnormal; Notable for the following components:   RBC 3.73 (*)    Hemoglobin 11.9 (*)    HCT 36.1 (*)    Neutro Abs 8.2 (*)    All other components within normal limits  LACTIC ACID, PLASMA  LACTIC ACID, PLASMA  URINALYSIS, W/ REFLEX TO CULTURE (INFECTION SUSPECTED)     EKG  None   RADIOLOGY Outpatient MR reviewed   PROCEDURES:  Critical Care performed: No   MEDICATIONS ORDERED IN ED: Medications - No data to display   IMPRESSION / MDM / ASSESSMENT AND  PLAN / ED COURSE  I reviewed the triage vital signs and the nursing notes.                              Differential diagnosis includes, but is not limited to, discitis, osteomyelitis, degenerative disease  Patient's presentation is most consistent with acute presentation with potential threat to life or bodily function.   Patient presented to the emergency department today because of concerns for outpatient MRI of the cervical spine which showed possible discitis/osteomyelitis.  I discussed this finding with the patient.  Did discuss with ID who ideally would have biopsy performed.  In discussion with IR they stated they did not biopsy cervical spine lesions.  Neurosurgery recommended obtaining MRI with and without for further evaluation.  Discussed with Dr. Hilma with hospitalist service who will evaluate for admission.     FINAL CLINICAL IMPRESSION(S) / ED DIAGNOSES   Final diagnoses:  Neck pain  Cervical discitis      Note:  This document was prepared using Dragon voice recognition software and may include unintentional dictation  errors.    Floy Roberts, MD 04/08/24 2031

## 2024-04-09 ENCOUNTER — Ambulatory Visit: Admitting: Nurse Practitioner

## 2024-04-09 ENCOUNTER — Inpatient Hospital Stay

## 2024-04-09 ENCOUNTER — Other Ambulatory Visit: Payer: Self-pay

## 2024-04-09 DIAGNOSIS — E1142 Type 2 diabetes mellitus with diabetic polyneuropathy: Secondary | ICD-10-CM

## 2024-04-09 DIAGNOSIS — Z992 Dependence on renal dialysis: Secondary | ICD-10-CM | POA: Diagnosis not present

## 2024-04-09 DIAGNOSIS — M4622 Osteomyelitis of vertebra, cervical region: Secondary | ICD-10-CM | POA: Diagnosis not present

## 2024-04-09 DIAGNOSIS — D631 Anemia in chronic kidney disease: Secondary | ICD-10-CM | POA: Diagnosis not present

## 2024-04-09 DIAGNOSIS — N186 End stage renal disease: Secondary | ICD-10-CM | POA: Diagnosis not present

## 2024-04-09 DIAGNOSIS — J439 Emphysema, unspecified: Secondary | ICD-10-CM | POA: Diagnosis not present

## 2024-04-09 LAB — CBC
HCT: 32.3 % — ABNORMAL LOW (ref 39.0–52.0)
Hemoglobin: 10.7 g/dL — ABNORMAL LOW (ref 13.0–17.0)
MCH: 31.9 pg (ref 26.0–34.0)
MCHC: 33.1 g/dL (ref 30.0–36.0)
MCV: 96.4 fL (ref 80.0–100.0)
Platelets: 267 K/uL (ref 150–400)
RBC: 3.35 MIL/uL — ABNORMAL LOW (ref 4.22–5.81)
RDW: 14.3 % (ref 11.5–15.5)
WBC: 9.4 K/uL (ref 4.0–10.5)
nRBC: 0 % (ref 0.0–0.2)

## 2024-04-09 LAB — BASIC METABOLIC PANEL WITH GFR
Anion gap: 14 (ref 5–15)
BUN: 62 mg/dL — ABNORMAL HIGH (ref 8–23)
CO2: 26 mmol/L (ref 22–32)
Calcium: 8.8 mg/dL — ABNORMAL LOW (ref 8.9–10.3)
Chloride: 97 mmol/L — ABNORMAL LOW (ref 98–111)
Creatinine, Ser: 6.81 mg/dL — ABNORMAL HIGH (ref 0.61–1.24)
GFR, Estimated: 8 mL/min — ABNORMAL LOW (ref >60)
Glucose, Bld: 207 mg/dL — ABNORMAL HIGH (ref 70–99)
Potassium: 3.9 mmol/L (ref 3.5–5.1)
Sodium: 136 mmol/L (ref 135–145)

## 2024-04-09 LAB — GLUCOSE, CAPILLARY
Glucose-Capillary: 198 mg/dL — ABNORMAL HIGH (ref 70–99)
Glucose-Capillary: 184 mg/dL — ABNORMAL HIGH (ref 70–99)
Glucose-Capillary: 253 mg/dL — ABNORMAL HIGH (ref 70–99)
Glucose-Capillary: 286 mg/dL — ABNORMAL HIGH (ref 70–99)

## 2024-04-09 LAB — HEPATITIS B SURFACE ANTIGEN: Hepatitis B Surface Ag: NONREACTIVE

## 2024-04-09 MED ORDER — DELFLEX-LC/1.5% DEXTROSE 344 MOSM/L IP SOLN
Freq: Four times a day (QID) | INTRAPERITONEAL | Status: DC
  Filled 2024-04-09: qty 3000

## 2024-04-09 MED ORDER — ORAL CARE MOUTH RINSE: 15.0000 mL | OROMUCOSAL | Status: DC | PRN

## 2024-04-09 MED ORDER — HYDRALAZINE HCL 50 MG PO TABS
100.0000 mg | ORAL_TABLET | Freq: Three times a day (TID) | ORAL | Status: DC
  Administered 2024-04-09 – 2024-04-11 (×7): 100 mg via ORAL
  Filled 2024-04-09 (×7): qty 2

## 2024-04-09 MED ORDER — ISOSORBIDE MONONITRATE ER 30 MG PO TB24
30.0000 mg | ORAL_TABLET | Freq: Two times a day (BID) | ORAL | Status: DC
  Administered 2024-04-09 – 2024-04-11 (×6): 30 mg via ORAL
  Filled 2024-04-09 (×6): qty 1

## 2024-04-09 MED ORDER — BISACODYL 5 MG PO TBEC: 10.0000 mg | DELAYED_RELEASE_TABLET | Freq: Two times a day (BID) | ORAL | Status: DC | PRN

## 2024-04-09 MED ORDER — EZETIMIBE 10 MG PO TABS
10.0000 mg | ORAL_TABLET | Freq: Every day | ORAL | Status: DC
  Administered 2024-04-09 – 2024-04-11 (×3): 10 mg via ORAL
  Filled 2024-04-09 (×3): qty 1

## 2024-04-09 MED ORDER — GADOBUTROL 1 MMOL/ML IV SOLN
8.0000 mL | Freq: Once | INTRAVENOUS | Status: AC | PRN
  Administered 2024-04-09: 8 mL via INTRAVENOUS

## 2024-04-09 MED ORDER — GENTAMICIN SULFATE 0.1 % EX CREA
1.0000 | TOPICAL_CREAM | Freq: Every day | CUTANEOUS | Status: DC
  Administered 2024-04-09: 1 via TOPICAL
  Filled 2024-04-09: qty 15

## 2024-04-09 MED ORDER — DAPTOMYCIN-SODIUM CHLORIDE 700-0.9 MG/100ML-% IV SOLN
8.0000 mg/kg | INTRAVENOUS | Status: DC
  Administered 2024-04-11: 700 mg via INTRAVENOUS
  Filled 2024-04-09: qty 100

## 2024-04-09 MED ORDER — CARVEDILOL 3.125 MG PO TABS
3.1250 mg | ORAL_TABLET | Freq: Two times a day (BID) | ORAL | Status: DC
  Administered 2024-04-09 – 2024-04-11 (×5): 3.125 mg via ORAL
  Filled 2024-04-09 (×5): qty 1

## 2024-04-09 MED ORDER — DAPTOMYCIN-SODIUM CHLORIDE 700-0.9 MG/100ML-% IV SOLN
8.0000 mg/kg | Freq: Once | INTRAVENOUS | Status: AC
  Administered 2024-04-09: 700 mg via INTRAVENOUS
  Filled 2024-04-09: qty 100

## 2024-04-09 MED ORDER — PANTOPRAZOLE SODIUM 40 MG PO TBEC
40.0000 mg | DELAYED_RELEASE_TABLET | Freq: Two times a day (BID) | ORAL | Status: DC
  Administered 2024-04-09 – 2024-04-11 (×4): 40 mg via ORAL
  Filled 2024-04-09 (×4): qty 1

## 2024-04-09 MED ORDER — NITROGLYCERIN 0.4 MG SL SUBL
0.4000 mg | SUBLINGUAL_TABLET | SUBLINGUAL | Status: DC | PRN
Start: 2024-04-09 — End: 2024-04-11

## 2024-04-09 MED ORDER — HEPARIN SODIUM (PORCINE) 1000 UNIT/ML IJ SOLN
INTRAPERITONEAL | Status: DC | PRN
  Filled 2024-04-09: qty 3000

## 2024-04-09 MED ORDER — DIPHENHYDRAMINE HCL 50 MG/ML IJ SOLN: 12.5000 mg | Freq: Three times a day (TID) | INTRAMUSCULAR | Status: DC | PRN

## 2024-04-09 MED ORDER — INSULIN GLARGINE-YFGN 100 UNIT/ML ~~LOC~~ SOLN
16.0000 [IU] | Freq: Every day | SUBCUTANEOUS | Status: DC
  Filled 2024-04-09 (×3): qty 0.16

## 2024-04-09 MED ORDER — TORSEMIDE 20 MG PO TABS
20.0000 mg | ORAL_TABLET | Freq: Two times a day (BID) | ORAL | Status: DC
  Administered 2024-04-09 (×2): 20 mg via ORAL
  Filled 2024-04-09 (×3): qty 1

## 2024-04-09 MED ORDER — SODIUM CHLORIDE 0.9 % IV SOLN
2.0000 g | Freq: Every day | INTRAVENOUS | Status: DC
  Administered 2024-04-09 – 2024-04-11 (×3): 2 g via INTRAVENOUS
  Filled 2024-04-09 (×3): qty 20

## 2024-04-09 MED ORDER — DELFLEX-LC/2.5% DEXTROSE 394 MOSM/L IP SOLN: INTRAPERITONEAL | Status: DC

## 2024-04-09 MED ORDER — NORTRIPTYLINE HCL 25 MG PO CAPS
50.0000 mg | ORAL_CAPSULE | Freq: Every day | ORAL | Status: DC
  Administered 2024-04-09: 50 mg via ORAL
  Filled 2024-04-09: qty 2

## 2024-04-09 MED ORDER — AMLODIPINE BESYLATE 10 MG PO TABS
10.0000 mg | ORAL_TABLET | Freq: Every day | ORAL | Status: DC
  Administered 2024-04-09 – 2024-04-11 (×3): 10 mg via ORAL
  Filled 2024-04-09 (×3): qty 1

## 2024-04-09 MED ORDER — ASPIRIN 81 MG PO TBEC
81.0000 mg | DELAYED_RELEASE_TABLET | Freq: Every day | ORAL | Status: DC
  Administered 2024-04-09 – 2024-04-11 (×3): 81 mg via ORAL
  Filled 2024-04-09 (×3): qty 1

## 2024-04-09 NOTE — Consult Note (Addendum)
 Consulting Department:  Inpatient medicine  Primary Physician:  Liana Fish, NP  Chief Complaint: Possible cervical discitis osteomyelitis  History of Present Illness: 04/09/2024 [name] [name] is a 63 y.o. male who presents with the chief complaint of possible cervical discitis osteomyelitis.  He was called in from radiology.  He has a complicated past medical history he is on dialysis and has multiple medical comorbidities including swallowing difficulty from esophageal issues.  He was seen in clinic for significant pain, went to the radiology department to have a MRI which resulted with possible discitis osteomyelitis and was sent to the emergency department for workup.  The symptoms are causing a significant impact on the patient's life.   Review of Systems:  A 10 point review of systems is negative, except for the pertinent positives and negatives detailed in the HPI.  Past Medical History: Past Medical History:  Diagnosis Date   Anemia    Arthritis    CAD (coronary artery disease)    a. 12/2016 s/p CABG x 4 (LIMA->LAD, VG->RCA, VG->OM1, VG->D1); b. 02/2018 MV: EF 35%, small-med inferolaterlal infarct. No ischemia.   CKD (chronic kidney disease), stage IV [facility])    Peritoneal Dialysis   Colon polyps    COPD (chronic obstructive pulmonary disease) (HCC)    Dupre's syndrome    GERD (gastroesophageal reflux disease)    HFrEF (heart failure reduced ejection fraction) (HCC)    a.) 2018 EF 35%; b.) 02/2018 EF 50%; c). 02/2019 EF 40-45%; d.) 08/2019 Echo: EF 50-55%, no rwma, GrII DD; e.) 04/2020 Echo: EF 30-35%, mod-sev glob HK. Mild LVH. G2DD. Low-nl RV fxn. Mildly dil LA. Mild MR. Mild-mod Ao sclerosis w/o stenosis; f.) TTE 02/09/2021: EF 55%; septal HK, LAE; G2DD.   History of 2019 novel coronavirus disease (COVID-19) 02/08/2021   Hyperlipidemia    Hypertension    Ischemic cardiomyopathy    a.) 02/2018 MV: EF 35%; b.) 08/2019 Echo: EF 50-55%; c.) 04/2020 Echo: EF 30-35%; d.)  TTE 02/09/2021: EF 50-55%.   Lymphedema    Migraine    Myocardial infarction (HCC)    Neuropathy    PAD (peripheral artery disease)    a. 04/2017 Aortoiliac duplex: R iliac dzs; b. 03/2020 LE Duplex: mild bilat atherosclerosis throughout. Patent vessels.   Pneumonia    Retinopathy    S/P CABG x 4    a.)  4v CABG: LIMA->LAD, SVG->RCA, SVG->OM1, SVG->D1   Sleep apnea    a.) does not require nocturnal PAP therapy   Statin intolerance    Stomach ulcer    T2DM (type 2 diabetes mellitus) (HCC)     Past Surgical History: Past Surgical History:  Procedure Laterality Date   bone graft surgery Bilateral    on both feet x 2   CAPD INSERTION N/A 11/13/2021   Procedure: LAPAROSCOPIC INSERTION CONTINUOUS AMBULATORY PERITONEAL DIALYSIS  (CAPD) CATHETER;  Surgeon: Jordis Laneta FALCON, MD;  Location: [facility] ORS;  Service: General;  Laterality: N/A;   CAPD REVISION N/A 01/10/2022   Procedure: LAPAROSCOPIC REVISION CONTINUOUS AMBULATORY PERITONEAL DIALYSIS  (CAPD) CATHETER;  Surgeon: Jordis Laneta FALCON, MD;  Location: [facility] ORS;  Service: General;  Laterality: N/A;   CARDIAC CATHETERIZATION  2013   S/p PCI    CARDIAC CATHETERIZATION  2018   S/p CABG   CATARACT EXTRACTION Bilateral    COLONOSCOPY     COLONOSCOPY WITH PROPOFOL  N/A 10/17/2022   Procedure: COLONOSCOPY WITH PROPOFOL ;  Surgeon: Jinny Carmine, MD;  Location: [facility] END[facility];  Service: Endoscopy;  Laterality: N/A;  CORONARY ARTERY BYPASS GRAFT  2018   (LIMA-LAD,VG-RCA,VG-OM1,VG-D1)   ESOPHAGOGASTRODUODENOSCOPY (EGD) WITH PROPOFOL  N/A 04/04/2020   Procedure: ESOPHAGOGASTRODUODENOSCOPY (EGD) WITH PROPOFOL ;  Surgeon: Jinny Carmine, MD;  Location: [facility] END[facility];  Service: Endoscopy;  Laterality: N/A;   ESOPHAGOGASTRODUODENOSCOPY (EGD) WITH PROPOFOL  N/A 06/06/2020   Procedure: ESOPHAGOGASTRODUODENOSCOPY (EGD) WITH PROPOFOL ;  Surgeon: Jinny Carmine, MD;  Location: [facility] END[facility];  Service: Endoscopy;  Laterality: N/A;   ESOPHAGOGASTRODUODENOSCOPY (EGD)  WITH PROPOFOL  N/A 09/24/2022   Procedure: ESOPHAGOGASTRODUODENOSCOPY (EGD) WITH PROPOFOL ;  Surgeon: Jinny Carmine, MD;  Location: [facility] END[facility]COPY;  Service: Endoscopy;  Laterality: N/A;   PERIPHERAL VASCULAR CATHETERIZATION Right 10/28/2016   PTA/DEB Right SFA   WRIST SURGERY Left    cyst    Allergies: Allergies as of 04/08/2024 - Reviewed 04/08/2024  Allergen Reaction Noted   Iodinated contrast media Other (See Comments) 06/01/2020   Other Other (See Comments) 05/19/2019   Statins Other (See Comments) 05/19/2019   Pregabalin Other (See Comments) 05/31/2019   Sacubitril-valsartan Other (See Comments) 12/02/2019   Atorvastatin Other (See Comments) 05/31/2019   Pravastatin Other (See Comments) 05/31/2019   Rosuvastatin Other (See Comments) 12/09/2012    Medications:  Current Facility-Administered Medications:    acetaminophen  (TYLENOL ) tablet 650 mg, 650 mg, Oral, Q6H PRN, Niu, Xilin, MD   albuterol  (PROVENTIL ) (2.5 MG/3ML) 0.083% nebulizer solution 2.5 mg, 2.5 mg, Nebulization, Q4H PRN, Niu, Xilin, MD   amLODipine  (NORVASC ) tablet 10 mg, 10 mg, Oral, Daily, Niu, Xilin, MD   bisacodyl (DULCOLAX) EC tablet 10 mg, 10 mg, Oral, BID PRN, Mansy, Jan A, MD   carvedilol  (COREG ) tablet 3.125 mg, 3.125 mg, Oral, BID WC, Niu, Xilin, MD   dextromethorphan -guaiFENesin  (MUCINEX  DM) 30-600 MG per 12 hr tablet 1 tablet, 1 tablet, Oral, BID PRN, Niu, Xilin, MD   diphenhydrAMINE (BENADRYL) injection 12.5 mg, 12.5 mg, Intravenous, Q8H PRN, Niu, Xilin, MD   ezetimibe  (ZETIA ) tablet 10 mg, 10 mg, Oral, Daily, Niu, Xilin, MD   hydrALAZINE  (APRESOLINE ) injection 10 mg, 10 mg, Intravenous, Q2H PRN, Niu, Xilin, MD   hydrALAZINE  (APRESOLINE ) tablet 100 mg, 100 mg, Oral, TID, Niu, Xilin, MD   insulin  aspart (novoLOG ) injection 0-5 Units, 0-5 Units, Subcutaneous, QHS, Niu, Xilin, MD   insulin  aspart (novoLOG ) injection 0-9 Units, 0-9 Units, Subcutaneous, TID WC, Niu, Xilin, MD   insulin  glargine-yfgn (SEMGLEE )  injection 16 Units, 16 Units, Subcutaneous, Daily, Zhang, Dekui, MD   isosorbide  mononitrate (IMDUR ) 24 hr tablet 30 mg, 30 mg, Oral, BID, Niu, Xilin, MD, 30 mg at 04/09/24 0108   nicotine (NICODERM CQ - dosed in mg/24 hours) patch 21 mg, 21 mg, Transdermal, Daily, Niu, Xilin, MD   nitroGLYCERIN  (NITROSTAT ) SL tablet 0.4 mg, 0.4 mg, Sublingual, Q5 min PRN, Niu, Xilin, MD   Oral care mouth rinse, 15 mL, Mouth Rinse, PRN, Niu, Xilin, MD   oxyCODONE -acetaminophen  (PERCOCET/ROXICET) 5-325 MG per tablet 1 tablet, 1 tablet, Oral, Q4H PRN, Niu, Xilin, MD, 1 tablet at 04/08/24 2224   pantoprazole  (PROTONIX ) EC tablet 40 mg, 40 mg, Oral, BID AC, Niu, Xilin, MD   torsemide  (DEMADEX ) tablet 20 mg, 20 mg, Oral, BID, Niu, Xilin, MD   Social History: Social History   Tobacco Use   Smoking status: Every Day    Current packs/day: 0.25    Average packs/day: 0.3 packs/day for 39.0 years (9.8 ttl pk-yrs)    Types: Cigarettes    Passive exposure: Past   Smokeless tobacco: Never  Vaping Use   Vaping status: Never Used  Substance Use Topics  Alcohol use: Not Currently    Comment: very rarely - wine   Drug use: Never    Family Medical History: Family History  Problem Relation Age of Onset   Diabetes Mother    Heart disease Father     Physical Examination: Vitals:   04/09/24 0358 04/09/24 0733  BP: 137/70 137/73  Pulse: 81 80  Resp: 17 16  Temp: 97.9 F (36.6 C) 97.9 F (36.6 C)  SpO2: 93% 100%     General: Patient is well developed, well nourished, calm, collected, and in no apparent distress.  NEUROLOGICAL:  No swelling redness or changes at the neck  Strength:No major motor deficits noted on examination,   Imaging: MR CERVICAL SPINE WO CONTRAST Addendum Date: 04/09/2024 ADDENDUM REPORT: 04/09/2024 01:48 ADDENDUM: Since time of initial dictation, the case was discussed with the covering overnight hospitalist, Dr. Hilma. Per the provided history, patient is clinically not septic  and is only with mild neck pain (although the provided clinical indication for the initial MRI from 04/06/2024 was for infection). Given this, it is possible that the signal changes at the C4-5 level are potentially degenerative in nature, although the presence of paraspinous edema is not typically seen in the setting of degenerative changes alone. Possible infection with osteomyelitis discitis should still be considered until proven otherwise. Additionally, apparently this exam was ordered as a with and without study. Patient was unable to tolerate the full length of the exam and terminated the study early, with no contrast given. Irregardless, the patient history of renal disease with low GFR largely precludes the administration of IV gadolinium contrast. Electronically Signed   By: Morene Hoard M.D.   On: 04/09/2024 01:48   Result Date: 04/09/2024 CLINICAL DATA:  Initial evaluation for acute neck pain. EXAM: MRI CERVICAL SPINE WITHOUT CONTRAST TECHNIQUE: Multiplanar, multisequence MR imaging of the cervical spine was performed. No intravenous contrast was administered. COMPARISON:  MRI from 04/06/2024 FINDINGS: Alignment: Examination mildly limited as the patient was unable to tolerate the full length of the exam. No axial GRE sequence was performed. Straightening with slight reversal of the normal cervical lordosis. 2 mm retrolisthesis of C4 on C5, with trace anterolisthesis of C7 on T1, stable. Vertebrae: Vertebral body height maintained without acute or chronic fracture. Bone marrow signal intensity overall within normal limits. No worrisome osseous lesions. Again seen are signal changes concerning for possible osteomyelitis discitis at C4-5, and to a lesser extent at C5-6, not appreciably changed or progressed from prior. Again, no significant epidural involvement visible on this noncontrast examination. Paraspinous edema without loculated collection. Cord: Normal signal morphology.  No visible  epidural collections. Posterior Fossa, vertebral arteries, paraspinal tissues: Patchy signal abnormality within the pons, most characteristic of chronic microvascular ischemic disease. Craniocervical junction within normal limits. Paraspinous edema centered about the C4-5 interspace without loculated collection. Preserved flow voids within the vertebral arteries bilaterally. Disc levels: Underlying multilevel cervical spondylosis, stable from recent MRI from 04/07/2019 fiber these changes are fully described. IMPRESSION: 1. No significant interval change in signal changes at C4-5, again concerning for possible infection with osteomyelitis discitis. More mild changes at C5-6, also potentially involved. Paraspinous edema without loculated soft tissue abscess. No visible significant epidural involvement on this noncontrast examination. 2. Underlying multilevel cervical spondylosis, stable from recent MRI from 04/07/2019. Electronically Signed: By: Morene Hoard M.D. On: 04/08/2024 21:17   DG Wrist Complete Left Result Date: 04/08/2024 CLINICAL DATA:  Concern for infection. EXAM: DG WRIST COMPLETE 3+V*L* COMPARISON:  None  Available. FINDINGS: No acute fracture or dislocation. No significant arthritic change. Advanced atherosclerotic calcification of the vessel. The soft tissues are unremarkable. IMPRESSION: 1. No acute fracture or dislocation. 2. Advanced atherosclerotic calcification of the vessel. Electronically Signed   By: Vanetta Chou M.D.   On: 04/08/2024 17:17   DG Chest 2 View Result Date: 04/08/2024 CLINICAL DATA:  Weakness. EXAM: CHEST - 2 VIEW COMPARISON:  04/01/2024 FINDINGS: Sternotomy wires unchanged. Lungs are adequately inflated without lobar consolidation or effusion. Cardiomediastinal silhouette and remainder of the exam is unchanged. IMPRESSION: No acute cardiopulmonary disease. Electronically Signed   By: Toribio Agreste M.D.   On: 04/08/2024 15:13     I have personally reviewed  the images and agree with the above interpretation.  Labs:    Latest Ref Rng & Units 04/09/2024    4:09 AM 04/08/2024    2:21 PM 04/01/2024    4:43 AM  CBC  WBC 4.0 - 10.5 K/uL 9.4  10.5  11.9   Hemoglobin 13.0 - 17.0 g/dL 89.2  88.0  87.5   Hematocrit 39.0 - 52.0 % 32.3  36.1  37.7   Platelets 150 - 400 K/uL 267  291  290       Latest Ref Rng & Units 04/09/2024    4:09 AM 04/08/2024    2:21 PM 04/01/2024    4:43 AM  BMP  Glucose 70 - 99 mg/dL 792  840  767   BUN 8 - 23 mg/dL 62  58  76   Creatinine 0.61 - 1.24 mg/dL 3.18  3.50  3.76   Sodium 135 - 145 mmol/L 136  135  133   Potassium 3.5 - 5.1 mmol/L 3.9  4.0  3.8   Chloride 98 - 111 mmol/L 97  94  93   CO2 22 - 32 mmol/L 26  27  25    Calcium  8.9 - 10.3 mg/dL 8.8  9.2  8.6     INR  1.1 (11/13 2057)   Assessment and Plan: [name] [name] is a pleasant 63 y.o. male with a history of painful cervicalgia, was seen in clinic and ordered an MRI.  MRI resulted and resulted in a ER callback given possible discitis osteomyelitis.  He was admitted to the emergency department and plan for systemic workup.  Neurosurgery was consulted regarding possible open biopsy.  I did discuss with the patient that a disc biopsy would have to be a surgical procedure if done by a neurosurgeon, this would include a open neck cutdown exposure and incision of the disc for biopsy/culture.  Given his history of swallowing difficulties, this is generally precluded in most cervical spinal surgeries.  He was quite hesitant given his history.  There is also possibility of spreading the infection to other areas of the neck from direct transfer, including the various structures in the neck including the carotid sheath, esophagus, trachea, and pharynx which all would need to be dissected around in order to gain access.   I would recommend evaluating whether or not he would be okay to go forward with a contrast-enhanced MRI to see whether or not this is just degenerative  disease or how strongly it we are considering that this would be an infectious disease given the fact that he has a normal white count and is been currently afebrile.  Will continue to follow-up with the inpatient team on decisions.  Penne MICAEL Sharps, MD/MSCR Dept. of Neurosurgery   MRI was positive for osteomyelitis, although we technically could  get access to these bones surgically it would be very high risk given history of CAD with CABG, CKD on dialysis, historically uncontrolled diabetes, Baseline swallowing difficulties, Heart failure secondary to ischemic cardiomyopathy, PAD. He would also be at high risk of permanent swallowing issues given his baseline swallowing difficulties.  If tissue is necessary I would recommend a formal consultation to IR vs finding alternative sites, patient was recently seen in orthopedics with significant elbow region swelling.

## 2024-04-09 NOTE — Progress Notes (Signed)
 Patient is refusing safety measures such as bed alarm and non skid socks.  Patient was educated on the fall risk bundle.

## 2024-04-09 NOTE — Progress Notes (Signed)
 Per Dr. Marcelino with nephrology, okay to place PICC, in brachial vein only.[name] [name]

## 2024-04-09 NOTE — Plan of Care (Signed)
  Problem: Education: Goal: Ability to describe self-care measures that may prevent or decrease complications (Diabetes Survival Skills Education) will improve Outcome: Progressing   Problem: Coping: Goal: Ability to adjust to condition or change in health will improve Outcome: Progressing   Problem: Activity: Goal: Risk for activity intolerance will decrease Outcome: Progressing   Problem: Pain Managment: Goal: General experience of comfort will improve and/or be controlled Outcome: Progressing   Problem: Skin Integrity: Goal: Risk for impaired skin integrity will decrease Outcome: Progressing

## 2024-04-09 NOTE — Treatment Plan (Signed)
 Diagnosis: Cervical spine discitis  Baseline Creatinine ESRD on PD  Culture Result: none available  Allergies  Allergen Reactions   Iodinated Contrast Media Other (See Comments)    Kidney disease  Kidney disease  Kidney disease  Kidney disease   Other Other (See Comments)    Pain  With joint stiffness     Statins Other (See Comments)    Pain  With joint stiffness   Pain  With joint stiffness  Other Reaction(s): Joint Pain   Pregabalin Other (See Comments)    Generalized aches and pains Generalized aches and pains Generalized aches and pains Generalized aches and pains Generalized aches and pains   Sacubitril-Valsartan Other (See Comments)    Hyperkalemia  Hyperkalemia Hyperkalemia  Hyperkalemia   Atorvastatin Other (See Comments)    Muscle aches Muscle aches   Pravastatin Other (See Comments)    Muscle aches Muscle aches   Rosuvastatin Other (See Comments)    Muscle Aches Other reaction(s): JOINT PAIN Other reaction(s): JOINT PAIN Muscle Aches     OPAT Orders Daptomycin 700mg  IV every 48 hrs  Ceftriaxone  2 grams IV every 24 hours Duration: 6 weeks End Date: 05/20/24   central line Care Per Protocol:  Labs weekly while on IV antibiotics: _X_ CBC with differential _X_ CK _X_ CMP _X_ CRP _X_ ESR   IR to remove central line after completion of antibiotic Fax weekly lab results  promptly to [phone]  Clinic Follow Up Appt: Dr>Bonner Larue on 04/27/24 at 8.45Am   Call [phone] with any questions or critical value

## 2024-04-09 NOTE — Progress Notes (Signed)
 Patient is refusing the hospital insulin . Patient reports that he will use his own insulin  or he will leave.   This has been discussed with pharmacy and primary provider.  Patient is allowing staff to check his blood sugar.   Per secure chat message with previous nurse and night coverage:   Patient admitted tonight. He reported to me that the hospitalist he met with in the ED said he would be receiving Vancomycin. I did not see any order so I wanted to inquire. Also the patient said he has been a diabetic for 30 years and he wants to only take his own insulin . He takes at home Humalog  75/25 mix 20 units in the am 45 min to 1 hour before his daily peritoneal dialysis, Humalog  R for meal coverage and Toujeo  Max 60 units daily between 8-9 am. He feels so strongly about administering his own insulin  that he said he would leave the hospital if he has to have nursing administer hospital insulin . Thank you  Thu 11:55 PM Caleb Exon, MD was added by Madison DELENA Peaches, MD. Thu 11:56 PM JM Madison DELENA Peaches, MD left. Thu 11:57 PM AUDREY Caleb Exon, MD Thanks, It is OK to let him use his own insulin . per ID, Dr. Fayette we will hold off antibiotics now since pt is not septic.    Dr. Laurita discussed with patient this AM as well.   Patient BS have been taken throughout the shift, and patient has been self dosing.

## 2024-04-09 NOTE — Hospital Course (Addendum)
 [name] [name] is a 63 y.o. 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  Initial MRI of the cervical spine performed 11/11 showed a possible discitis at the C4-C5 level. Patient is seen by ID, neurosurgery, IR could not perform biopsy.  Neurosurgery deemed patient to be high risk for cervical spine biopsy.  After discussion with ID and nephrology, MRI with contrast was performed, showed discitis in the C4-C5. Antibiotics with daptomycin and Rocephin  was started. PICC line was also placed into brachial vein after approved by nephrology.

## 2024-04-09 NOTE — Progress Notes (Signed)
 PHARMACY CONSULT NOTE FOR:  OUTPATIENT  PARENTERAL ANTIBIOTIC THERAPY (OPAT)  Indication: Cervical spine discitis Regimen:  Daptomycin 700mg  IV q48h Ceftriaxone  2gm IV q24h End date: 05/20/2024   Labs - Once weekly:  CBC/D, CMP, CPK, ESR and CRP Fax weekly lab results  promptly to [phone] Radiology to remove central line after completion of antibiotic Call [phone] with any questions or critical value  IV antibiotic discharge orders are pended. To discharging provider:  please sign these orders via discharge navigator,  Select New Orders & click on the button choice - Manage This Unsigned Work.     Thank you for allowing pharmacy to be a part of this patient's care.  Abigaelle Verley, PharmD, BCPS, BCIDP Work Cell: [phone] 04/09/2024 5:23 PM

## 2024-04-09 NOTE — Progress Notes (Signed)
 Mobility Specialist - Progress Note   04/09/24 1505  Mobility  Activity Stood at bedside;Pivoted/transferred from bed to chair;Pivoted/transferred from chair to bed  Level of Assistance Standby assist, set-up cues, supervision of patient - no hands on  Assistive Device None  Range of Motion/Exercises Active;Right arm;Left arm  Activity Response Tolerated well  Mobility visit 1 Mobility  Mobility Specialist Start Time (ACUTE ONLY) 1451  Mobility Specialist Stop Time (ACUTE ONLY) 1504  Mobility Specialist Time Calculation (min) (ACUTE ONLY) 13 min   Pt supine upon entry, utilizing RA. Pt expressed that he uses a  wheelchair at baseline and has been for years now, stated walking is out the picture. Pt agreeable to transfer to/from the recliner and does so with supervision--- tolerated well. Pt expressed heighten pain in neck and shoulders when utilizing BUE to push from surface. Pt is left seated EOB with alarm set and needs within reach, MD present at bedside. RN notified.  America Silvan Mobility Specialist 04/09/24 3:10 PM

## 2024-04-09 NOTE — Progress Notes (Signed)
 [facility]  ROUNDING NOTE   Subjective:   [name] [name] is a 63 year old male with past medical conditions including hypertension, poorly controlled diabetes with neuropathy, GERD, COPD, CAD, and end-stage renal disease on peritoneal dialysis. Presents to the ED with shortness of breath and been admitted for Osteomyelitis of cervical spine [facility]) [M46.22]  Patient is followed by [facility], by Dr Jimmie.  He presents to the emergency department at the advice of his neurosurgeon due to possible infection seen on MRI last week.  Patient does complain of mild discomfort in his mid back.  Completes peritoneal dialysis as scheduled.  Was able to perform PD drain prior to ED arrival.  Labs unremarkable for renal patient.  Primary team will perform MRI with contrast today and evaluate.  Nephrology has signed off on this request.  If patient remains overnight, will plan for peritoneal dialysis treatment.  Objective:  Vital signs in last 24 hours:  Temp:  [97.7 F (36.5 C)-98.2 F (36.8 C)] 97.9 F (36.6 C) (11/14 0733) Pulse Rate:  [80-92] 80 (11/14 0733) Resp:  [16-19] 16 (11/14 0733) BP: (137-162)/(70-85) 137/73 (11/14 0733) SpO2:  [93 %-100 %] 100 % (11/14 0733) Weight:  [83.8 kg-84.1 kg] 83.8 kg (11/14 0500)  Weight change:  Filed Weights   04/08/24 1421 04/09/24 0500  Weight: 84.1 kg 83.8 kg    Intake/Output: I/O last 3 completed shifts: In: 240 [P.O.:240] Out: -    Intake/Output this shift:  No intake/output data recorded.  Physical Exam: General: NAD  Head: Normocephalic, atraumatic. Moist oral mucosal membranes  Eyes: Anicteric  Lungs:  Clear to auscultation, room air  Heart: Regular rate and rhythm  Abdomen:  Soft, nontender  Extremities:  No peripheral edema.  Neurologic: Awake, alert, conversant  Skin: Warm,dry, no rash, left shin skin tear  Access: [facility]    Basic Metabolic Panel: Recent Labs  Lab 04/08/24 1421 04/09/24 0409  NA 135 136   K 4.0 3.9  CL 94* 97*  CO2 27 26  GLUCOSE 159* 207*  BUN 58* 62*  CREATININE 6.49* 6.81*  CALCIUM  9.2 8.8*    Liver Function Tests: Recent Labs  Lab 04/08/24 1421  AST 19  ALT 21  ALKPHOS 169*  BILITOT 0.4  PROT 6.6  ALBUMIN  3.3*   No results for input(s): LIPASE, AMYLASE in the last 168 hours. No results for input(s): AMMONIA in the last 168 hours.  CBC: Recent Labs  Lab 04/08/24 1421 04/09/24 0409  WBC 10.5 9.4  NEUTROABS 8.2*  --   HGB 11.9* 10.7*  HCT 36.1* 32.3*  MCV 96.8 96.4  PLT 291 267    Cardiac Enzymes: No results for input(s): CKTOTAL, CKMB, CKMBINDEX, TROPONINI in the last 168 hours.  BNP: Invalid input(s): POCBNP  CBG: Recent Labs  Lab 04/08/24 2128 04/09/24 0747 04/09/24 1144  GLUCAP 180* 198* 184*    Microbiology: Results for orders placed or performed during the hospital encounter of 04/08/24  Culture, blood (Routine X 2) w Reflex to ID Panel     Status: None (Preliminary result)   Collection Time: 04/08/24  8:57 PM   Specimen: BLOOD  Result Value Ref Range Status   Specimen Description BLOOD BLOOD LEFT WRIST  Final   Special Requests   Final    BOTTLES DRAWN AEROBIC AND ANAEROBIC Blood Culture results may not be optimal due to an inadequate volume of blood received in culture bottles   Culture   Final    NO GROWTH < 12 HOURS  Performed at [facility], [address], [place], [place] [address]    Report Status PENDING  Incomplete  Culture, blood (Routine X 2) w Reflex to ID Panel     Status: None (Preliminary result)   Collection Time: 04/08/24  8:57 PM   Specimen: BLOOD  Result Value Ref Range Status   Specimen Description BLOOD BLOOD LEFT FOREARM  Final   Special Requests   Final    BOTTLES DRAWN AEROBIC ONLY Blood Culture results may not be optimal due to an inadequate volume of blood received in culture bottles   Culture   Final    NO GROWTH < 12 HOURS Performed at [facility], [address] [place], [place] [address]    Report Status PENDING  Incomplete    Coagulation Studies: Recent Labs    04/08/24 05-May-2056  LABPROT 15.1  INR 1.1    Urinalysis: No results for input(s): COLORURINE, LABSPEC, PHURINE, GLUCOSEU, HGBUR, BILIRUBINUR, KETONESUR, PROTEINUR, UROBILINOGEN, NITRITE, LEUKOCYTESUR in the last 72 hours.  Invalid input(s): APPERANCEUR    Imaging: MR WRIST LEFT WO CONTRAST Result Date: 04/09/2024 EXAM: MRI OF THE LEFT WRIST WITHOUT INTRAVENOUS CONTRAST 04/08/2024 11:32:20 PM TECHNIQUE: Multiplanar multisequence MRI of the left wrist was performed without the administration of intravenous contrast. COMPARISON: Radiographs 04/08/2024. CLINICAL HISTORY: Wrist pain, current discitis-osteomyelitis in the cervical spine; Recent perianal abscess drainage; Small wound along the left wrist; End stage renal disease and diabetes. FINDINGS: LIMITATIONS/ARTIFACTS: Motion artifact is present, reducing diagnostic sensitivity and specificity. INTRINSIC LIGAMENTS: Intact scapholunate and lunotriquetral ligaments without interval widening. Tear of a small dorsal ligament on the Lunate. No significant periligamentous edema. EXTRINSIC LIGAMENTS: The volar extrinsic ligaments including the radio-scapho-capitate and radio-luno-triquetral ligaments are grossly intact. TFCC: The triangulofibrocartilage complex is preserved including the articular disc, the meniscal homologue, the extensor carpi ulnas tendon, the volar and dorsal distal radioulnar ligaments, and the ulnolunate and ulnotriquetral ligaments. EXTENSOR TENDONS: Intact extensor tendons without tenosynovitis or tear. CARPAL TUNNEL AND FLEXOR TENDONS: Intact flexor tendons and flexor retinaculum. Normal MRI appearance of the median nerve. Intact ulnar nerve in Guyon's Canal. The suspected ganglion cyst (described in SOFT TISSUE) is noted in proximity to the [employer] Tendon. JOINT SPACES: Mild  degenerative arthropathy at the 1st Carpometacarpal Articulation. No significant joint effusion. Unremarkable alignment. BONE MARROW: No substantial marrow edema is identified to indicate osteomyelitis involving the wrist. No acute fracture or aggressive marrow replacing lesion. SOFT TISSUE: Suspected 1.1 x 0.3 x 0.9 cm ganglion cyst along the anterior rim of the Radius at the Radiocarpal Articulation as shown on image 19 series 11 tracking medial to the Radial Artery in proximity to the Flexor Carpi Radialis Tendon. No significant soft tissue edema or fluid collections other than the described cyst. No significant muscle edema or atrophy. VASCULATURE: Low signal in the walls of the Radial and Ulnar Arteries compatible with atheromatous vascular calcification. Patency not assessed. IMPRESSION: 1. No evidence of osteomyelitis involving the left wrist. 2. Mild degenerative arthropathy at the first carpometacarpal articulation. 3. Suspected 1.1 x 0.3 x 0.9 cm ganglion cyst along the anterior rim of the radius at the radiocarpal articulation, tracking medial to the radial artery but adjacent to the flexor carpi radialis. 4. Low signal in the walls of the radial and ulnar arteries compatible with atheromatous vascular calcification. Patency not assessed. Electronically signed by: Ryan Salvage MD 04/09/2024 08:36 AM EST RP Workstation: [id number]   MR CERVICAL SPINE WO CONTRAST Addendum Date: 04/09/2024 ADDENDUM REPORT: 04/09/2024  01:48 ADDENDUM: Since time of initial dictation, the case was discussed with the covering overnight hospitalist, Dr. Hilma. Per the provided history, patient is clinically not septic and is only with mild neck pain (although the provided clinical indication for the initial MRI from 04/06/2024 was for infection). Given this, it is possible that the signal changes at the C4-5 level are potentially degenerative in nature, although the presence of paraspinous edema is not typically seen in  the setting of degenerative changes alone. Possible infection with osteomyelitis discitis should still be considered until proven otherwise. Additionally, apparently this exam was ordered as a with and without study. Patient was unable to tolerate the full length of the exam and terminated the study early, with no contrast given. Irregardless, the patient history of renal disease with low GFR largely precludes the administration of IV gadolinium contrast. Electronically Signed   By: Morene Hoard M.D.   On: 04/09/2024 01:48   Result Date: 04/09/2024 CLINICAL DATA:  Initial evaluation for acute neck pain. EXAM: MRI CERVICAL SPINE WITHOUT CONTRAST TECHNIQUE: Multiplanar, multisequence MR imaging of the cervical spine was performed. No intravenous contrast was administered. COMPARISON:  MRI from 04/06/2024 FINDINGS: Alignment: Examination mildly limited as the patient was unable to tolerate the full length of the exam. No axial GRE sequence was performed. Straightening with slight reversal of the normal cervical lordosis. 2 mm retrolisthesis of C4 on C5, with trace anterolisthesis of C7 on T1, stable. Vertebrae: Vertebral body height maintained without acute or chronic fracture. Bone marrow signal intensity overall within normal limits. No worrisome osseous lesions. Again seen are signal changes concerning for possible osteomyelitis discitis at C4-5, and to a lesser extent at C5-6, not appreciably changed or progressed from prior. Again, no significant epidural involvement visible on this noncontrast examination. Paraspinous edema without loculated collection. Cord: Normal signal morphology.  No visible epidural collections. Posterior Fossa, vertebral arteries, paraspinal tissues: Patchy signal abnormality within the pons, most characteristic of chronic microvascular ischemic disease. Craniocervical junction within normal limits. Paraspinous edema centered about the C4-5 interspace without loculated  collection. Preserved flow voids within the vertebral arteries bilaterally. Disc levels: Underlying multilevel cervical spondylosis, stable from recent MRI from 04/07/2019 fiber these changes are fully described. IMPRESSION: 1. No significant interval change in signal changes at C4-5, again concerning for possible infection with osteomyelitis discitis. More mild changes at C5-6, also potentially involved. Paraspinous edema without loculated soft tissue abscess. No visible significant epidural involvement on this noncontrast examination. 2. Underlying multilevel cervical spondylosis, stable from recent MRI from 04/07/2019. Electronically Signed: By: Morene Hoard M.D. On: 04/08/2024 21:17   DG Wrist Complete Left Result Date: 04/08/2024 CLINICAL DATA:  Concern for infection. EXAM: DG WRIST COMPLETE 3+V*L* COMPARISON:  None Available. FINDINGS: No acute fracture or dislocation. No significant arthritic change. Advanced atherosclerotic calcification of the vessel. The soft tissues are unremarkable. IMPRESSION: 1. No acute fracture or dislocation. 2. Advanced atherosclerotic calcification of the vessel. Electronically Signed   By: Vanetta Chou M.D.   On: 04/08/2024 17:17   DG Chest 2 View Result Date: 04/08/2024 CLINICAL DATA:  Weakness. EXAM: CHEST - 2 VIEW COMPARISON:  04/01/2024 FINDINGS: Sternotomy wires unchanged. Lungs are adequately inflated without lobar consolidation or effusion. Cardiomediastinal silhouette and remainder of the exam is unchanged. IMPRESSION: No acute cardiopulmonary disease. Electronically Signed   By: Toribio Agreste M.D.   On: 04/08/2024 15:13     Medications:     amLODipine   10 mg Oral Daily   carvedilol   3.125 mg Oral BID WC   ezetimibe   10 mg Oral Daily   hydrALAZINE   100 mg Oral TID   insulin  aspart  0-5 Units Subcutaneous QHS   insulin  aspart  0-9 Units Subcutaneous TID WC   insulin  glargine-yfgn  16 Units Subcutaneous Daily   isosorbide  mononitrate  30 mg  Oral BID   nicotine  21 mg Transdermal Daily   pantoprazole   40 mg Oral BID AC   torsemide   20 mg Oral BID   acetaminophen , albuterol , bisacodyl, dextromethorphan -guaiFENesin , diphenhydrAMINE, hydrALAZINE , nitroGLYCERIN , mouth rinse, oxyCODONE -acetaminophen   Assessment/ Plan:  [name] is a 63 y.o.  male with past medical conditions including hypertension, poorly controlled diabetes with neuropathy, GERD, COPD, CAD, and end-stage renal disease on peritoneal dialysis.presents to emergency department for further evaluation of possible back infection.  [facility]  1. End stage renal disease on peritoneal dialysis. CCAPD, 2 cycles and extended extroneal dwell.  Reports no recently missed dialysis treatments.  Last treatment completed yesterday.  If patient remains overnight.  Will have nursing resume home schedule.  Patient instills extraneal dialysate overnight with 2.5% dialysate exchanges during the day. Will place order to continue this treatment.  Nephrology has cleared patient to receive spine MRI with contrast.  2. Anemia of chronic kidney disease Lab Results  Component Value Date   HGB 10.7 (L) 04/09/2024    Hgb stable  3. 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 Home regimen includes Humalog  and lispro. Most recent hemoglobin A1c is 6.6 on 11/20/23.      LOS: 1 [name] [name] 11/14/202511:53 AM

## 2024-04-09 NOTE — Consult Note (Signed)
 NAME: [name] [name]  DOB: 1961-01-04  MRN: [id number]  Date/Time: 04/09/2024 12:11 PM  REQUESTING PROVIDER: Dr. Laurita Subjective:  REASON FOR CONSULT: Cervical vertebral osteomyelitis/discitis ? [name] [name] is a 63 y.o. with a history of diabetes mellitus, end-stage renal disease on peritoneal dialysis for the past 3 years,Dupuytren's contractures of the hands, peripheral vascular disease,Severe peripheral neuropathy with multiple wounds extremities, CAD status post CABG, ischemic cardiomyopathy, retinopathy, presents with neck pain of 4-6 weeks duration. As per patient he developed neck pain about 4 to 6 weeks ago.  It gradually has worsened He has never experienced this pain before.  He was referred to the neurosurgeon who ordered MRI of the cervical spine and that showed acute osteomyelitis/discitis at C4-C5 level.  So he was sent to the ED for further evaluation and treatment.  At the same time patient has had multiple wounds in his arms and legs.  He also had a perianal abscess that was drained a few weeks ago.  He has right olecranon bursa which is not painful.  I am asked to see the patient for the vertebral osteomyelitis/discitis Patient does not have any fever or chills He has not had a fall He has a dog and a cat at home but he has not received any bites He does have wounds on his legs especially from hitting on furniture.  I also he has a wound on the left wrist area which appears spontaneously he says it initially was an erythematous area and later turned out into a wound covered with an eschar now  Past Medical History:  Diagnosis Date   Anemia    Arthritis    CAD (coronary artery disease)    a. 12/2016 s/p CABG x 4 (LIMA->LAD, VG->RCA, VG->OM1, VG->D1); b. 02/2018 MV: EF 35%, small-med inferolaterlal infarct. No ischemia.   CKD (chronic kidney disease), stage IV [facility])    Peritoneal Dialysis   Colon polyps    COPD (chronic obstructive pulmonary disease) (HCC)    Dupre's  syndrome    GERD (gastroesophageal reflux disease)    HFrEF (heart failure reduced ejection fraction) (HCC)    a.) 2018 EF 35%; b.) 02/2018 EF 50%; c). 02/2019 EF 40-45%; d.) 08/2019 Echo: EF 50-55%, no rwma, GrII DD; e.) 04/2020 Echo: EF 30-35%, mod-sev glob HK. Mild LVH. G2DD. Low-nl RV fxn. Mildly dil LA. Mild MR. Mild-mod Ao sclerosis w/o stenosis; f.) TTE 02/09/2021: EF 55%; septal HK, LAE; G2DD.   History of 2019 novel coronavirus disease (COVID-19) 02/08/2021   Hyperlipidemia    Hypertension    Ischemic cardiomyopathy    a.) 02/2018 MV: EF 35%; b.) 08/2019 Echo: EF 50-55%; c.) 04/2020 Echo: EF 30-35%; d.) TTE 02/09/2021: EF 50-55%.   Lymphedema    Migraine    Myocardial infarction (HCC)    Neuropathy    PAD (peripheral artery disease)    a. 04/2017 Aortoiliac duplex: R iliac dzs; b. 03/2020 LE Duplex: mild bilat atherosclerosis throughout. Patent vessels.   Pneumonia    Retinopathy    S/P CABG x 4    a.)  4v CABG: LIMA->LAD, SVG->RCA, SVG->OM1, SVG->D1   Sleep apnea    a.) does not require nocturnal PAP therapy   Statin intolerance    Stomach ulcer    T2DM (type 2 diabetes mellitus) (HCC)     Past Surgical History:  Procedure Laterality Date   bone graft surgery Bilateral    on both feet x 2   CAPD INSERTION N/A 11/13/2021   Procedure: LAPAROSCOPIC INSERTION  CONTINUOUS AMBULATORY PERITONEAL DIALYSIS  (CAPD) CATHETER;  Surgeon: Jordis Laneta FALCON, MD;  Location: [facility] ORS;  Service: General;  Laterality: N/A;   CAPD REVISION N/A 01/10/2022   Procedure: LAPAROSCOPIC REVISION CONTINUOUS AMBULATORY PERITONEAL DIALYSIS  (CAPD) CATHETER;  Surgeon: Jordis Laneta FALCON, MD;  Location: [facility] ORS;  Service: General;  Laterality: N/A;   CARDIAC CATHETERIZATION  2013   S/p PCI    CARDIAC CATHETERIZATION  2018   S/p CABG   CATARACT EXTRACTION Bilateral    COLONOSCOPY     COLONOSCOPY WITH PROPOFOL  N/A 10/17/2022   Procedure: COLONOSCOPY WITH PROPOFOL ;  Surgeon: Jinny Carmine, MD;  Location: [facility]  ENDOSCOPY;  Service: Endoscopy;  Laterality: N/A;   CORONARY ARTERY BYPASS GRAFT  2018   (LIMA-LAD,VG-RCA,VG-OM1,VG-D1)   ESOPHAGOGASTRODUODENOSCOPY (EGD) WITH PROPOFOL  N/A 04/04/2020   Procedure: ESOPHAGOGASTRODUODENOSCOPY (EGD) WITH PROPOFOL ;  Surgeon: Jinny Carmine, MD;  Location: [facility] ENDOSCOPY;  Service: Endoscopy;  Laterality: N/A;   ESOPHAGOGASTRODUODENOSCOPY (EGD) WITH PROPOFOL  N/A 06/06/2020   Procedure: ESOPHAGOGASTRODUODENOSCOPY (EGD) WITH PROPOFOL ;  Surgeon: Jinny Carmine, MD;  Location: [facility] ENDOSCOPY;  Service: Endoscopy;  Laterality: N/A;   ESOPHAGOGASTRODUODENOSCOPY (EGD) WITH PROPOFOL  N/A 09/24/2022   Procedure: ESOPHAGOGASTRODUODENOSCOPY (EGD) WITH PROPOFOL ;  Surgeon: Jinny Carmine, MD;  Location: [facility] ENDOSCOPY;  Service: Endoscopy;  Laterality: N/A;   PERIPHERAL VASCULAR CATHETERIZATION Right 10/28/2016   PTA/DEB Right SFA   WRIST SURGERY Left    cyst    Social History   Socioeconomic History   Marital status: Married    Spouse name: Roxanne   Number of children: Not on file   Years of education: Not on file   Highest education level: Not on file  Occupational History   Not on file  Tobacco Use   Smoking status: Every Day    Current packs/day: 0.25    Average packs/day: 0.3 packs/day for 39.0 years (9.8 ttl pk-yrs)    Types: Cigarettes    Passive exposure: Past   Smokeless tobacco: Never  Vaping Use   Vaping status: Never Used  Substance and Sexual Activity   Alcohol use: Not Currently    Comment: very rarely - wine   Drug use: Never   Sexual activity: Not Currently  Other Topics Concern   Not on file  Social History Narrative   Not on file   Social Drivers of Health   Financial Resource Strain: Low Risk  (10/30/2023)   Received from [facility]   Overall Financial Resource Strain (CARDIA)    Difficulty of Paying Living Expenses: Not hard at all  Food Insecurity: No Food Insecurity (04/09/2024)   Hunger Vital Sign    Worried About  Running Out of Food in the Last Year: Never true    Ran Out of Food in the Last Year: Never true  Transportation Needs: No Transportation Needs (04/09/2024)   PRAPARE - Administrator, Civil Service (Medical): No    Lack of Transportation (Non-Medical): No  Physical Activity: Unknown (09/09/2021)   Received [facility]   Exercise Vital Sign    On average, how many days per week do you engage in moderate to strenuous exercise (like a brisk walk)?: 0 days    Minutes of Exercise per Session: Not on file  Stress: No Stress Concern Present (02/23/2023)   [employer] of Occupational [employer] - Occupational Stress Questionnaire    Feeling of Stress : Only a little  Social Connections: Somewhat Isolated (09/09/2021)   Received from [facility]   Social Network  How would you rate your social network (family, work, friends)?: Restricted participation with some degree of social isolation  Intimate Partner Violence: Not At Risk (04/09/2024)   Humiliation, Afraid, Rape, and Kick questionnaire    Fear of Current or Ex-Partner: No    Emotionally Abused: No    Physically Abused: No    Sexually Abused: No    Family History  Problem Relation Age of Onset   Diabetes Mother    Heart disease Father    Allergies  Allergen Reactions   Iodinated Contrast Media Other (See Comments)    Kidney disease  Kidney disease  Kidney disease  Kidney disease   Other Other (See Comments)    Pain  With joint stiffness     Statins Other (See Comments)    Pain  With joint stiffness   Pain  With joint stiffness  Other Reaction(s): Joint Pain   Pregabalin Other (See Comments)    Generalized aches and pains Generalized aches and pains Generalized aches and pains Generalized aches and pains Generalized aches and pains   Sacubitril-Valsartan Other (See Comments)    Hyperkalemia  Hyperkalemia Hyperkalemia  Hyperkalemia   Atorvastatin Other (See Comments)    Muscle aches Muscle  aches   Pravastatin Other (See Comments)    Muscle aches Muscle aches   Rosuvastatin Other (See Comments)    Muscle Aches Other reaction(s): JOINT PAIN Other reaction(s): JOINT PAIN Muscle Aches    I? Current Facility-Administered Medications  Medication Dose Route Frequency Provider Last Rate Last Admin   acetaminophen  (TYLENOL ) tablet 650 mg  650 mg Oral Q6H PRN Niu, Xilin, MD       albuterol  (PROVENTIL ) (2.5 MG/3ML) 0.083% nebulizer solution 2.5 mg  2.5 mg Nebulization Q4H PRN Niu, Xilin, MD       amLODipine  (NORVASC ) tablet 10 mg  10 mg Oral Daily Niu, Xilin, MD   10 mg at 04/09/24 0917   bisacodyl (DULCOLAX) EC tablet 10 mg  10 mg Oral BID PRN Mansy, Jan A, MD       carvedilol  (COREG ) tablet 3.125 mg  3.125 mg Oral BID WC Niu, Xilin, MD   3.125 mg at 04/09/24 9083   dextromethorphan -guaiFENesin  (MUCINEX  DM) 30-600 MG per 12 hr tablet 1 tablet  1 tablet Oral BID PRN Niu, Xilin, MD       diphenhydrAMINE (BENADRYL) injection 12.5 mg  12.5 mg Intravenous Q8H PRN Niu, Xilin, MD       ezetimibe  (ZETIA ) tablet 10 mg  10 mg Oral Daily Niu, Xilin, MD   10 mg at 04/09/24 1033   hydrALAZINE  (APRESOLINE ) injection 10 mg  10 mg Intravenous Q2H PRN Niu, Xilin, MD       hydrALAZINE  (APRESOLINE ) tablet 100 mg  100 mg Oral TID Niu, Xilin, MD   100 mg at 04/09/24 9082   insulin  aspart (novoLOG ) injection 0-5 Units  0-5 Units Subcutaneous QHS Niu, Xilin, MD       insulin  aspart (novoLOG ) injection 0-9 Units  0-9 Units Subcutaneous TID WC Niu, Xilin, MD       insulin  glargine-yfgn (SEMGLEE ) injection 16 Units  16 Units Subcutaneous Daily Zhang, Dekui, MD       isosorbide  mononitrate (IMDUR ) 24 hr tablet 30 mg  30 mg Oral BID Niu, Xilin, MD   30 mg at 04/09/24 0916   nicotine (NICODERM CQ - dosed in mg/24 hours) patch 21 mg  21 mg Transdermal Daily Niu, Xilin, MD       nitroGLYCERIN  (NITROSTAT ) SL tablet  0.4 mg  0.4 mg Sublingual Q5 min PRN Niu, Xilin, MD       Oral care mouth rinse  15 mL Mouth Rinse  PRN Niu, Xilin, MD       oxyCODONE -acetaminophen  (PERCOCET/ROXICET) 5-325 MG per tablet 1 tablet  1 tablet Oral Q4H PRN Niu, Xilin, MD   1 tablet at 04/09/24 1201   pantoprazole  (PROTONIX ) EC tablet 40 mg  40 mg Oral BID AC Niu, Xilin, MD       torsemide  (DEMADEX ) tablet 20 mg  20 mg Oral BID Niu, Xilin, MD   20 mg at 04/09/24 9083     Abtx:  Anti-infectives (From admission, onward)    None       REVIEW OF SYSTEMS:  Const: negative fever, negative chills, negative weight loss Eyes: negative diplopia or visual changes, negative eye pain ENT: negative coryza, negative sore throat Resp: negative cough, hemoptysis, dyspnea Cards: negative for chest pain, palpitations, lower extremity edema GU: negative for frequency, dysuria and hematuria GI: Negative for abdominal pain, diarrhea, bleeding, constipation Skin: As above Heme: negative for easy bruising and gum/nose bleeding MS: Neck pain Neurolo: Peripheral neuropathy Psych: negative for feelings of anxiety, depression  Endocrine:diabetes Allergy/Immunology- negative for any medication or food allergies ?: Objective:  VITALS:  BP 137/73 (BP Location: Left Arm)   Pulse 80   Temp 97.9 F (36.6 C)   Resp 16   Ht 6' 1 (1.854 m)   Wt 83.8 kg   SpO2 100%   BMI 24.37 kg/m   PHYSICAL EXAM:  General: Alert, cooperative, no distress, pale appears stated age.  Head: Normocephalic, without obvious abnormality, atraumatic. Eyes: Conjunctivae clear, anicteric sclerae. Pupils are equal ENT Nares normal. No drainage or sinus tenderness. Poor dentition Neck: Supple, symmetrical, no adenopathy, thyroid : non tender no carotid bruit and no JVD. Back: No CVA tenderness. Lungs: Bilateral air entry Heart: Regular rate and rhythm, no murmur, rub or gallop. Abdomen: Soft, non-tender, PD catheter in place Extremities: Bilateral Dupuytren's contractures of the hands  skin: Multiple wounds present on the legs and the left hand     Lymph:  Cervical, supraclavicular normal. Neurologic: Wasting of the interosseous muscles peripheral neuropathy  pertinent Labs Lab Results CBC    Component Value Date/Time   WBC 9.4 04/09/2024 0409   RBC 3.35 (L) 04/09/2024 0409   HGB 10.7 (L) 04/09/2024 0409   HGB 10.5 (L) 03/02/2024 1449   HCT 32.3 (L) 04/09/2024 0409   HCT 30.9 (L) 03/02/2024 1449   PLT 267 04/09/2024 0409   PLT 236 03/02/2024 1449   MCV 96.4 04/09/2024 0409   MCV 96 03/02/2024 1449   MCV 91 12/29/2012 1200   MCH 31.9 04/09/2024 0409   MCHC 33.1 04/09/2024 0409   RDW 14.3 04/09/2024 0409   RDW 13.0 03/02/2024 1449   RDW 13.7 12/29/2012 1200   LYMPHSABS 1.2 04/08/2024 1421   LYMPHSABS 1.4 03/02/2024 1449   LYMPHSABS 3.6 12/29/2012 1200   MONOABS 0.7 04/08/2024 1421   MONOABS 0.8 12/29/2012 1200   EOSABS 0.3 04/08/2024 1421   EOSABS 0.2 03/02/2024 1449   EOSABS 0.6 12/29/2012 1200   BASOSABS 0.1 04/08/2024 1421   BASOSABS 0.1 03/02/2024 1449   BASOSABS 0.2 (H) 12/29/2012 1200       Latest Ref Rng & Units 04/09/2024    4:09 AM 04/08/2024    2:21 PM 04/01/2024    4:43 AM  CMP  Glucose 70 - 99 mg/dL 792  840  767  BUN 8 - 23 mg/dL 62  58  76   Creatinine 0.61 - 1.24 mg/dL 3.18  3.50  3.76   Sodium 135 - 145 mmol/L 136  135  133   Potassium 3.5 - 5.1 mmol/L 3.9  4.0  3.8   Chloride 98 - 111 mmol/L 97  94  93   CO2 22 - 32 mmol/L 26  27  25    Calcium  8.9 - 10.3 mg/dL 8.8  9.2  8.6   Total Protein 6.5 - 8.1 g/dL  6.6  6.9   Total Bilirubin 0.0 - 1.2 mg/dL  0.4  0.9   Alkaline Phos 38 - 126 U/L  169  181   AST 15 - 41 U/L  19  23   ALT 0 - 44 U/L  21  24       Microbiology: Recent Results (from the past 240 hours)  Resp panel by RT-PCR (RSV, Flu A&B, Covid) Anterior Nasal Swab     Status: None   Collection Time: 04/01/24  4:43 AM   Specimen: Anterior Nasal Swab  Result Value Ref Range Status   SARS Coronavirus 2 by RT PCR NEGATIVE NEGATIVE Final    Comment: (NOTE) SARS-CoV-2 target nucleic acids  are NOT DETECTED.  The SARS-CoV-2 RNA is generally detectable in upper respiratory specimens during the acute phase of infection. The lowest concentration of SARS-CoV-2 viral copies this assay can detect is 138 copies/mL. A negative result does not preclude SARS-Cov-2 infection and should not be used as the sole basis for treatment or other patient management decisions. A negative result may occur with  improper specimen collection/handling, submission of specimen other than nasopharyngeal swab, presence of viral mutation(s) within the areas targeted by this assay, and inadequate number of viral copies(<138 copies/mL). A negative result must be combined with clinical observations, patient history, and epidemiological information. The expected result is Negative.  Fact Sheet for Patients:  bloggercourse.com  Fact Sheet for Healthcare Providers:  seriousbroker.it  This test is no t yet approved or cleared by the [place]  FDA and  has been authorized for detection and/or diagnosis of SARS-CoV-2 by FDA under an Emergency Use Authorization (EUA). This EUA will remain  in effect (meaning this test can be used) for the duration of the COVID-19 declaration under Section 564(b)(1) of the Act, 21 U.S.C.section 360bbb-3(b)(1), unless the authorization is terminated  or revoked sooner.       Influenza A by PCR NEGATIVE NEGATIVE Final   Influenza B by PCR NEGATIVE NEGATIVE Final    Comment: (NOTE) The Xpert Xpress SARS-CoV-2/FLU/RSV plus assay is intended as an aid in the diagnosis of influenza from Nasopharyngeal swab specimens and should not be used as a sole basis for treatment. Nasal washings and aspirates are unacceptable for Xpert Xpress SARS-CoV-2/FLU/RSV testing.  Fact Sheet for Patients: bloggercourse.com  Fact Sheet for Healthcare Providers: seriousbroker.it  This test is  not yet approved or cleared by the [place]  FDA and has been authorized for detection and/or diagnosis of SARS-CoV-2 by FDA under an Emergency Use Authorization (EUA). This EUA will remain in effect (meaning this test can be used) for the duration of the COVID-19 declaration under Section 564(b)(1) of the Act, 21 U.S.C. section 360bbb-3(b)(1), unless the authorization is terminated or revoked.     Resp Syncytial Virus by PCR NEGATIVE NEGATIVE Final    Comment: (NOTE) Fact Sheet for Patients: bloggercourse.com  Fact Sheet for Healthcare Providers: seriousbroker.it  This test is not yet approved or  cleared by the [place]  FDA and has been authorized for detection and/or diagnosis of SARS-CoV-2 by FDA under an Emergency Use Authorization (EUA). This EUA will remain in effect (meaning this test can be used) for the duration of the COVID-19 declaration under Section 564(b)(1) of the Act, 21 U.S.C. section 360bbb-3(b)(1), unless the authorization is terminated or revoked.  Performed at [facility], [address] [place], [place] [address]   Culture, blood (Routine X 2) w Reflex to ID Panel     Status: None (Preliminary result)   Collection Time: 04/08/24  8:57 PM   Specimen: BLOOD  Result Value Ref Range Status   Specimen Description BLOOD BLOOD LEFT WRIST  Final   Special Requests   Final    BOTTLES DRAWN AEROBIC AND ANAEROBIC Blood Culture results may not be optimal due to an inadequate volume of blood received in culture bottles   Culture   Final    NO GROWTH < 12 HOURS Performed at [facility], [address], [place], [place] [address]    Report Status PENDING  Incomplete  Culture, blood (Routine X 2) w Reflex to ID Panel     Status: None (Preliminary result)   Collection Time: 04/08/24  8:57 PM   Specimen: BLOOD  Result Value Ref Range Status   Specimen Description BLOOD BLOOD LEFT FOREARM   Final   Special Requests   Final    BOTTLES DRAWN AEROBIC ONLY Blood Culture results may not be optimal due to an inadequate volume of blood received in culture bottles   Culture   Final    NO GROWTH < 12 HOURS Performed at [facility], [address], [place], [place] [address]    Report Status PENDING  Incomplete     IMAGING RESULTS: Enhancement of the disc spaces and adjacent vertebral bodies from C4-C6 consistent with osteomyelitis Mild prevertebral soft tissue enhancement without epidural or paraspinal abscess  I have personally reviewed the films ? Impression/Recommendation 63 year old male with history of end-stage renal disease on PD catheter with multiple wounds on his extremities presenting with neck pain of 4 weeks duration MRI showed C4-C6 osteomyelitis  I discussed with the neurosurgeon A procedure to get microbiological sample could be risky because of issues with him having some trouble swallowing So may have to treat him empirically But I did culture the wound from the left wrist as that may give us  a clue But will still do broad-spectrum antibiotic covering for MRSA as well as gram-negative rods currently will do daptomycin and ceftriaxone .  If the wound shows any Pseudomonas then we can switch him to cefepime  He is going to need a central line placement for home IV antibiotics and will need at least 6 weeks of antibiotics.  History of leg wounds in the past and has received IV antibiotics He states he had received 6 months of antibiotic at 1 time  Diabetes mellitus management as per primary team  Severe peripheral neuropathy  Dupuytren's contracture  End-stage renal disease on peritoneal dialysis Recommend sending peritoneal fluid for culture and cell count  Anemia  CAD status post CABG  This consult involve complex antimicrobial management ? ? I have personally spent  -75--minutes involved in face-to-face and non-face-to-face activities for  this patient on the day of the visit. Professional time spent includes the following activities: Preparing to see the patient (review of tests), Obtaining and/or reviewing separately obtained history (admission/discharge record), Performing a medically appropriate examination and/or evaluation , Ordering  medications/tests/procedures, referring and communicating with other health care professionals, Documenting clinical information in the EMR, Independently interpreting results (not separately reported), Communicating results to the patient/, Counseling and educating the patient/ and Care coordination with the hospitalist, neurosurgeon and ID pharmacist..    ________________________________________________ ID will not routinely see him this weekend Call if needed If the patient is discharged from the weekend I will follow him as outpatient OPAT orders entered. Note:  This document was prepared using Dragon voice recognition software and may include unintentional dictation errors.

## 2024-04-09 NOTE — Care Management CC44 (Signed)
 Condition Code 44 Documentation Completed  Patient Details  Name: [name] [name] MRN: [id number] Date of Birth: 25-Aug-1960   Condition Code 44 given:  Yes Patient signature on Condition Code 44 notice:  Yes Documentation of 2 MD's agreement:  Yes Code 44 added to claim:  Yes    Marinda Cooks, RN 04/09/2024, 3:09 PM

## 2024-04-09 NOTE — Progress Notes (Signed)
 Progress Note   Patient: [name] [name] FMW:[id number] DOB: 02-12-1961 DOA: 04/08/2024     1 DOS: the patient was seen and examined on 04/09/2024   Brief hospital course: Ledarrius [name] is a 63 y.o. 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  Initial MRI of the cervical spine performed 11/11 showed a possible discitis at the C4-C5 level. Patient is seen by ID, neurosurgery, IR could not perform biopsy.  Neurosurgery deemed patient to be high risk for cervical spine biopsy.  After discussion with ID and nephrology, MRI with contrast was performed on cervical spine.   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  Clinically not septic.  Consulted with Dr. Fayette of ID, who recommended MRI with contrast and possible IR aspiration biopsy.  IR could not perform biopsy due to risks.  Patient also seen by neurosurgery, not recommended for biopsy due to risk of death from the procedure. After discussion with ID and Dr. Marcelino, MRI of the cervical spine with contrast is performed: Pending reports. Antibiotics will be decided by ID.  Left wrist pain: Has a small wound in left wrist with surrounding erythema, with tenderness, suspecting infection.  X-ray  of left wrist  is negative for bony fracture. MRI did not show any evidence of osteomyelitis.   Essential hypertension:  Continue home medicines.   ESRD on peritoneal dialysis [facility] Followed by nephrology for peritoneal dialysis.  HLD (hyperlipidemia) -Zetia  - Patient is getting Repatha  injection   History of CAD (coronary artery disease):  Continue aspirin  and Zetia .   Chronic systolic heart failure (HCC): Today: 03/25/2023 showed a EF of 35-40% with grade 3 diastolic dysfunction.   Compensated, no exacerbation.   COPD (chronic obstructive pulmonary disease) (HCC): Stable -Bronchodilators as needed Mucinex    Depression with anxiety: - Hold nortriptyline  due to QTc prolongation 535   Diabetes mellitus with renal complication: Recent A1c 6.6, well-controlled.   After long discussion with the patient, he refused to let us  manage his diabetes.  He states that he has been managing his diabetes for more than 10 years, he has a better sense of how much insulin  he will need.  But he will let us  monitor his glucose.       Subjective:  Patient doing well, still has some neck pain.  No fever chills or nausea vomiting.  Physical Exam: Vitals:   04/09/24 0051 04/09/24 0358 04/09/24 0500 04/09/24 0733  BP: (!) 145/73 137/70  137/73  Pulse: 87 81  80  Resp: 16 17  16   Temp: 98.2 F (36.8 C) 97.9 F (36.6 C)  97.9 F (36.6 C)  TempSrc:      SpO2: 95% 93%  100%  Weight:   83.8 kg   Height:       General exam: Appears calm and comfortable  Respiratory system: Clear to auscultation. Respiratory effort normal. Cardiovascular system: S1 & S2 heard, RRR. No JVD, murmurs, rubs, gallops or clicks. No pedal edema. Gastrointestinal system: Abdomen is nondistended, soft and nontender. No organomegaly or masses felt. Normal bowel sounds heard. Central nervous system: Alert and oriented. No focal neurological deficits. Extremities: Symmetric 5 x 5 power. Skin: No rashes, lesions or  ulcers Psychiatry: Judgement and insight appear normal. Mood & affect appropriate.    Data Reviewed:  Reviewed MRI results and lab results.  Family Communication: None  Disposition: Status is: Observation Discussed with UR, patient meets observation status.     Time spent: 35 minutes  Author: Murvin Mana, MD 04/09/2024 3:30 PM  For on call review [email or web address].

## 2024-04-09 NOTE — Progress Notes (Signed)
 PD pt is followed by [facility] . Navigator following to assist with any HD needs.  Suzen Satchel Dialysis Navigator [phone]

## 2024-04-09 NOTE — Plan of Care (Signed)
   Problem: Coping: Goal: Ability to adjust to condition or change in health will improve Outcome: Progressing   Problem: Fluid Volume: Goal: Ability to maintain a balanced intake and output will improve Outcome: Progressing   Problem: Health Behavior/Discharge Planning: Goal: Ability to identify and utilize available resources and services will improve Outcome: Progressing

## 2024-04-10 DIAGNOSIS — Z992 Dependence on renal dialysis: Secondary | ICD-10-CM | POA: Diagnosis not present

## 2024-04-10 DIAGNOSIS — E118 Type 2 diabetes mellitus with unspecified complications: Secondary | ICD-10-CM | POA: Diagnosis not present

## 2024-04-10 DIAGNOSIS — Z794 Long term (current) use of insulin: Secondary | ICD-10-CM | POA: Diagnosis not present

## 2024-04-10 DIAGNOSIS — N2581 Secondary hyperparathyroidism of renal origin: Secondary | ICD-10-CM | POA: Diagnosis not present

## 2024-04-10 DIAGNOSIS — N186 End stage renal disease: Secondary | ICD-10-CM | POA: Diagnosis not present

## 2024-04-10 DIAGNOSIS — M4622 Osteomyelitis of vertebra, cervical region: Secondary | ICD-10-CM | POA: Diagnosis not present

## 2024-04-10 DIAGNOSIS — J439 Emphysema, unspecified: Secondary | ICD-10-CM | POA: Diagnosis not present

## 2024-04-10 DIAGNOSIS — D631 Anemia in chronic kidney disease: Secondary | ICD-10-CM | POA: Diagnosis not present

## 2024-04-10 LAB — GLUCOSE, CAPILLARY
Glucose-Capillary: 213 mg/dL — ABNORMAL HIGH (ref 70–99)
Glucose-Capillary: 327 mg/dL — ABNORMAL HIGH (ref 70–99)
Glucose-Capillary: 194 mg/dL — ABNORMAL HIGH (ref 70–99)
Glucose-Capillary: 189 mg/dL — ABNORMAL HIGH (ref 70–99)

## 2024-04-10 LAB — HEPATITIS B SURFACE ANTIBODY, QUANTITATIVE: Hep B S AB Quant (Post): 11.1 m[IU]/mL

## 2024-04-10 LAB — CK: Total CK: 174 U/L (ref 49–397)

## 2024-04-10 MED ORDER — DELFLEX-LC/1.5% DEXTROSE 344 MOSM/L IP SOLN
INTRAPERITONEAL | Status: DC
  Filled 2024-04-10: qty 3000

## 2024-04-10 MED ORDER — SODIUM CHLORIDE 0.9% FLUSH
10.0000 mL | Freq: Two times a day (BID) | INTRAVENOUS | Status: DC
  Administered 2024-04-10 – 2024-04-11 (×3): 10 mL

## 2024-04-10 MED ORDER — SODIUM CHLORIDE 0.9% FLUSH: 10.0000 mL | INTRAVENOUS | Status: DC | PRN

## 2024-04-10 MED ORDER — TORSEMIDE 20 MG PO TABS
60.0000 mg | ORAL_TABLET | Freq: Every day | ORAL | Status: DC
  Administered 2024-04-10 – 2024-04-11 (×2): 60 mg via ORAL
  Filled 2024-04-10 (×2): qty 3

## 2024-04-10 MED ORDER — HEPARIN SODIUM (PORCINE) 1000 UNIT/ML IJ SOLN
INTRAPERITONEAL | Status: DC | PRN
  Filled 2024-04-10 (×2): qty 2500

## 2024-04-10 MED ORDER — CHLORHEXIDINE GLUCONATE CLOTH 2 % EX PADS
6.0000 | MEDICATED_PAD | Freq: Every day | CUTANEOUS | Status: DC
  Administered 2024-04-10 – 2024-04-11 (×2): 6 via TOPICAL

## 2024-04-10 NOTE — Progress Notes (Signed)
 2 day PD exchanges done on pt today.  1st bag with 2.5% dialysate with 1500units heparin .  2nd exchange with 1.5% dialysate.  Removed approx. 2.3L on last exchange.  Pt tolerated both fills and drains well.

## 2024-04-10 NOTE — TOC Initial Note (Signed)
 Transition of Care [facility]) - Initial/Assessment Note    Patient Details  Name: [name] [name] MRN: [id number] Date of Birth: 1960-07-13  Transition of Care [facility]) CM/SW Contact:    Alvia Olam Fabry, RN Phone Number: 04/10/2024, 9:16 AM  Clinical Narrative:      RNCM spoke with this pt who lives with his wife and utilizes [facility] and on line services for his pharmacy medications. Pt uses an electric wheelchair and has life for his mobility. Wife is his main support system and will provide transportation upon his discharge.             RNCM spoke with pt and discussed discharge with antibiotics therapy at home. Pt receptive with [facility] for this services. Pt will also need education with his wife concerning this therapy. Pt receptive and wife would like to review the agencies for choice of agency.   [facility] confirmed they will provide services for IV therapy with this pt.   Currently awaiting a call back for agency  of choice for [facility] to initiate education on antibiotic therapy. Pt aware will need prior approval for the services.   RNCM will update all parties when final on [facility] agency of choice.   [id number] Receive choice from wife indicating [facility] or [facility]. All parties aware ([facility] ([place]), [facility] (wife) and [facility]([place]) who has accepted this pt for services starting Monday.         Patient Goals and CMS Choice            Expected Discharge Plan and Services                                              Prior Living Arrangements/Services                       Activities of Daily Living   ADL Screening (condition at time of admission) Independently performs ADLs?: No Does the patient have a NEW difficulty with bathing/dressing/toileting/self-feeding that is expected to last >3 days?: No Does the patient have a NEW difficulty with getting in/out of bed, walking, or climbing stairs that is expected to last >3 days?:  No Does the patient have a NEW difficulty with communication that is expected to last >3 days?: No Is the patient deaf or have difficulty hearing?: No Does the patient have difficulty seeing, even when wearing glasses/contacts?: No Does the patient have difficulty concentrating, remembering, or making decisions?: No  Permission Sought/Granted                  Emotional Assessment              Admission diagnosis:  Osteomyelitis of cervical spine (HCC) [M46.22] Patient Active Problem List   Diagnosis Date Noted   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) 04/09/2024   Osteomyelitis of cervical spine (HCC) 04/08/2024   ESRD on peritoneal dialysis (HCC) 04/08/2024   HLD (hyperlipidemia) 04/08/2024   COPD (chronic obstructive pulmonary disease) (HCC) 04/08/2024   Left wrist pain 04/08/2024   Perianal abscess 04/08/2024   Gastroesophageal reflux disease without esophagitis 01/28/2024   Drug-induced constipation 01/28/2024   Hyperkalemia 03/24/2023   CAD (coronary artery disease) 03/24/2023   Hx of CABG 03/24/2023   Anemia 03/24/2023   Symptomatic bradycardia 03/24/2023   ESRD on dialysis (HCC) 02/24/2023   Peritoneal dialysis  catheter in place 02/24/2023   Aortic atherosclerosis 02/24/2023   Polyp of transverse colon 10/17/2022   Special screening for malignant neoplasms, colon 10/17/2022   Barrett's esophagus with dysplasia 09/24/2022   Hypokalemia    Wheeze    COVID-19 virus infection    Elevated d-dimer    Elevated troponin    Acute kidney injury superimposed on CKD    Hyponatremia    Chronic systolic heart failure (HCC)    Barrett's esophagus with low grade dysplasia    Diabetes mellitus with stage 4 chronic kidney disease GFR 15-29 (HCC) 06/01/2020   Serum albumin  decreased 06/01/2020   Vitamin D  deficiency 06/01/2020   Elevated hemoglobin A1c 06/01/2020   Elevated sed rate 06/01/2020   Elevated brain natriuretic peptide (BNP) level  06/01/2020   Insulin  dependent type 2 diabetes mellitus, uncontrolled 06/01/2020   Diabetes mellitus with complication, with long-term current use of insulin  (HCC) 06/01/2020   Chronic pain syndrome 05/24/2020   Pharmacologic therapy 05/24/2020   Disorder of skeletal system 05/24/2020   Problems influencing health status 05/24/2020   DDD (degenerative disc disease), cervical 05/24/2020   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, C5-6) (Left) 05/24/2020   Cervical Grade 1 Retrolisthesis of C4/C5 and C5/C6 05/24/2020   Chronic hand pain (Bilateral) 05/24/2020   Chronic leg and foot pain (Bilateral) 05/24/2020   Chronic lower extremity numbness from knee down (Bilateral) 05/24/2020   Chronic greater occipital neuralgia (Bilateral) 05/24/2020   Cervical facet syndrome (Left) 05/24/2020   Cervical disc disease with myelopathy 04/26/2020   Cervicalgia 04/09/2020   Syncope and collapse 04/09/2020   Esophageal dysphagia    Gastroesophageal reflux disease with esophagitis without hemorrhage    PAD (peripheral artery disease) 09/27/2019   Diabetes (HCC) 09/14/2019   Swelling of limb 09/14/2019   Lymphedema 09/14/2019   Shortness of breath 09/05/2019   Chest pain 07/16/2018   Essential hypertension 04/28/2018   History of pancreatitis 11/16/2017   Statin-induced myositis 11/16/2017   Type 2 diabetes mellitus with diabetic polyneuropathy, with long-term current use of insulin  (HCC) 11/16/2017   Coronary atherosclerosis 11/15/2016   Septic olecranon bursitis 10/14/2011   Cellulitis 10/06/2011   Depression with anxiety 02/22/2011   Diabetic peripheral neuropathy (HCC) 02/22/2011   Migraines 02/22/2011   Other hyperlipidemia 02/22/2011   Sleep apnea 11/22/2010   PCP:  Liana Fish, NP Pharmacy:   [id number] - [place], [place] - [address] AT [facility] [address] [place] [address] [place] [address] Phone: [phone] Fax: [phone]     Social Drivers of  Health (SDOH) Social History: SDOH Screenings   Food Insecurity: No Food Insecurity (04/09/2024)  Housing: Low Risk  (04/09/2024)  Transportation Needs: No Transportation Needs (04/09/2024)  Utilities: Not At Risk (04/09/2024)  Alcohol Screen: Low Risk  (04/17/2021)  Depression (PHQ2-9): Low Risk  (01/22/2023)  Financial Resource Strain: Low Risk  (10/30/2023)   Received from [facility]  Physical Activity: Unknown (09/09/2021)   Received [facility]  Social Connections: Somewhat Isolated (09/09/2021)   Received from [facility]  Stress: No Stress Concern Present (02/23/2023)  Tobacco Use: High Risk (04/08/2024)  Health Literacy: Adequate Health Literacy (02/23/2023)   SDOH Interventions:     Readmission Risk Interventions     No data to display

## 2024-04-10 NOTE — Progress Notes (Signed)
 Peripherally Inserted Central Catheter Placement  The IV Nurse has discussed with the patient and/or persons authorized to consent for the patient, the purpose of this procedure and the potential benefits and risks involved with this procedure.  The benefits include less needle sticks, lab draws from the catheter, and the patient may be discharged home with the catheter. Risks include, but not limited to, infection, bleeding, blood clot (thrombus formation), and puncture of an artery; nerve damage and irregular heartbeat and possibility to perform a PICC exchange if needed/ordered by physician.  Alternatives to this procedure were also discussed.  Bard Power PICC patient education guide, fact sheet on infection prevention and patient information card has been provided to patient /or left at bedside.    PICC Placement Documentation  PICC Single Lumen 04/09/24 Left Brachial 47 cm 2 cm (Active)  Indication for Insertion or Continuance of Line Home intravenous therapies (PICC only) 04/09/24 2355  Exposed Catheter (cm) 2 cm 04/09/24 2355  Site Assessment Clean, Dry, Intact 04/09/24 2355  Line Status Blood return noted;Flushed;Saline locked 04/09/24 2355  Dressing Type Transparent;Securing device 04/09/24 2355  Dressing Status Antimicrobial disc/dressing in place 04/09/24 2355  Line Care Connections checked and tightened 04/09/24 2355  Line Adjustment (NICU/IV Team Only) No 04/09/24 2355  Dressing Intervention New dressing;Adhesive placed at insertion site (IV team only) 04/09/24 2355  Dressing Change Due 04/12/24 04/09/24 2355       Debby Salomon CROME 04/10/2024, 12:14 AM

## 2024-04-10 NOTE — Progress Notes (Signed)
 [facility]  PROGRESS NOTE   Subjective:   Status post PICC line placement.  Feels well.  Objective:  Vital signs: Blood pressure (!) 144/74, pulse 89, temperature 97.6 F (36.4 C), resp. rate 17, height 6' 1 (1.854 m), weight 84 kg, SpO2 95%.  Intake/Output Summary (Last 24 hours) at 04/10/2024 1338 Last data filed at 04/10/2024 0900 Gross per 24 hour  Intake 383.45 ml  Output --  Net 383.45 ml   Filed Weights   04/08/24 1421 04/09/24 0500 04/10/24 0500  Weight: 84.1 kg 83.8 kg 84 kg     Physical Exam: General:  No acute distress  Head:  Normocephalic, atraumatic. Moist oral mucosal membranes  Eyes:  Anicteric  Neck:  Supple  Lungs:   Clear to auscultation, normal effort  Heart:  S1S2 no rubs  Abdomen:   Soft, nontender, bowel sounds present  Extremities:  peripheral edema.  Neurologic:  Awake, alert, following commands  Skin:  No lesions  Access:     Basic Metabolic Panel: Recent Labs  Lab 04/08/24 1421 04/09/24 0409  NA 135 136  K 4.0 3.9  CL 94* 97*  CO2 27 26  GLUCOSE 159* 207*  BUN 58* 62*  CREATININE 6.49* 6.81*  CALCIUM  9.2 8.8*   GFR: Estimated Creatinine Clearance: 12.4 mL/min (A) (by C-G formula based on SCr of 6.81 mg/dL (H)).  Liver Function Tests: Recent Labs  Lab 04/08/24 1421  AST 19  ALT 21  ALKPHOS 169*  BILITOT 0.4  PROT 6.6  ALBUMIN  3.3*   No results for input(s): LIPASE, AMYLASE in the last 168 hours. No results for input(s): AMMONIA in the last 168 hours.  CBC: Recent Labs  Lab 04/08/24 1421 04/09/24 0409  WBC 10.5 9.4  NEUTROABS 8.2*  --   HGB 11.9* 10.7*  HCT 36.1* 32.3*  MCV 96.8 96.4  PLT 291 267     HbA1C: Hemoglobin A1C  Date/Time Value Ref Range Status  11/20/2023 12:00 AM 6.6  Final  12/07/2020 09:59 AM 13.2 (A) 4.0 - 5.6 % Final  02/18/2020 10:51 AM 14.0 (A) 4.0 - 5.6 % Final   Hgb A1c MFr Bld  Date/Time Value Ref Range Status  03/25/2023 04:42 AM 7.6 (H) 4.8 - 5.6 % Final     Comment:    (NOTE) Pre diabetes:          5.7%-6.4%  Diabetes:              >6.4%  Glycemic control for   <7.0% adults with diabetes     Urinalysis: No results for input(s): COLORURINE, LABSPEC, PHURINE, GLUCOSEU, HGBUR, BILIRUBINUR, KETONESUR, PROTEINUR, UROBILINOGEN, NITRITE, LEUKOCYTESUR in the last 72 hours.  Invalid input(s): APPERANCEUR    Imaging: US  EKG SITE RITE Result Date: 04/09/2024 If Site Rite image not attached, placement could not be confirmed due to current cardiac rhythm.  MR CERVICAL SPINE W CONTRAST Result Date: 04/09/2024 EXAM: MRI CERVICAL SPINE WITH CONTRAST 04/09/2024 01:28:15 PM TECHNIQUE: Multiplanar multisequence MRI of the cervical spine was performed with the administration of intravenous contrast. CONTRAST: 8 mL GADAVIST COMPARISON: MRI of the cervical spine 04/08/2024. CLINICAL HISTORY: cervical osteomyelitis FINDINGS: BONES AND ALIGNMENT: Normal alignment. Normal vertebral body heights. Enhancement of the disc spaces and adjacent vertebral bodies from C4 to C6 consistent with osteomyelitis. SPINAL CORD: Normal spinal cord size. Normal spinal cord signal. No epidural abscess. SOFT TISSUES: Mild enhancement in the prevertebral soft tissues. IMPRESSION: 1. Enhancement of the disc spaces and adjacent vertebral bodies from C4  to C6 consistent with osteomyelitis. 2. Mild prevertebral soft tissue enhancement without epidural or paraspinal abscess. Electronically signed by: Ryan Chess MD 04/09/2024 03:29 PM EST RP Workstation: [id number]   MR WRIST LEFT WO CONTRAST Result Date: 04/09/2024 EXAM: MRI OF THE LEFT WRIST WITHOUT INTRAVENOUS CONTRAST 04/08/2024 11:32:20 PM TECHNIQUE: Multiplanar multisequence MRI of the left wrist was performed without the administration of intravenous contrast. COMPARISON: Radiographs 04/08/2024. CLINICAL HISTORY: Wrist pain, current discitis-osteomyelitis in the cervical spine; Recent perianal abscess  drainage; Small wound along the left wrist; End stage renal disease and diabetes. FINDINGS: LIMITATIONS/ARTIFACTS: Motion artifact is present, reducing diagnostic sensitivity and specificity. INTRINSIC LIGAMENTS: Intact scapholunate and lunotriquetral ligaments without interval widening. Tear of a small dorsal ligament on the Lunate. No significant periligamentous edema. EXTRINSIC LIGAMENTS: The volar extrinsic ligaments including the radio-scapho-capitate and radio-luno-triquetral ligaments are grossly intact. TFCC: The triangulofibrocartilage complex is preserved including the articular disc, the meniscal homologue, the extensor carpi ulnas tendon, the volar and dorsal distal radioulnar ligaments, and the ulnolunate and ulnotriquetral ligaments. EXTENSOR TENDONS: Intact extensor tendons without tenosynovitis or tear. CARPAL TUNNEL AND FLEXOR TENDONS: Intact flexor tendons and flexor retinaculum. Normal MRI appearance of the median nerve. Intact ulnar nerve in Guyon's Canal. The suspected ganglion cyst (described in SOFT TISSUE) is noted in proximity to the [employer] Tendon. JOINT SPACES: Mild degenerative arthropathy at the 1st Carpometacarpal Articulation. No significant joint effusion. Unremarkable alignment. BONE MARROW: No substantial marrow edema is identified to indicate osteomyelitis involving the wrist. No acute fracture or aggressive marrow replacing lesion. SOFT TISSUE: Suspected 1.1 x 0.3 x 0.9 cm ganglion cyst along the anterior rim of the Radius at the Radiocarpal Articulation as shown on image 19 series 11 tracking medial to the Radial Artery in proximity to the Flexor Carpi Radialis Tendon. No significant soft tissue edema or fluid collections other than the described cyst. No significant muscle edema or atrophy. VASCULATURE: Low signal in the walls of the Radial and Ulnar Arteries compatible with atheromatous vascular calcification. Patency not assessed. IMPRESSION: 1. No evidence of  osteomyelitis involving the left wrist. 2. Mild degenerative arthropathy at the first carpometacarpal articulation. 3. Suspected 1.1 x 0.3 x 0.9 cm ganglion cyst along the anterior rim of the radius at the radiocarpal articulation, tracking medial to the radial artery but adjacent to the flexor carpi radialis. 4. Low signal in the walls of the radial and ulnar arteries compatible with atheromatous vascular calcification. Patency not assessed. Electronically signed by: Ryan Salvage MD 04/09/2024 08:36 AM EST RP Workstation: [id number]   MR CERVICAL SPINE WO CONTRAST Addendum Date: 04/09/2024 ADDENDUM REPORT: 04/09/2024 01:48 ADDENDUM: Since time of initial dictation, the case was discussed with the covering overnight hospitalist, Dr. Hilma. Per the provided history, patient is clinically not septic and is only with mild neck pain (although the provided clinical indication for the initial MRI from 04/06/2024 was for infection). Given this, it is possible that the signal changes at the C4-5 level are potentially degenerative in nature, although the presence of paraspinous edema is not typically seen in the setting of degenerative changes alone. Possible infection with osteomyelitis discitis should still be considered until proven otherwise. Additionally, apparently this exam was ordered as a with and without study. Patient was unable to tolerate the full length of the exam and terminated the study early, with no contrast given. Irregardless, the patient history of renal disease with low GFR largely precludes the administration of IV gadolinium contrast. Electronically Signed   By: Morene  Nicholes M.D.   On: 04/09/2024 01:48   Result Date: 04/09/2024 CLINICAL DATA:  Initial evaluation for acute neck pain. EXAM: MRI CERVICAL SPINE WITHOUT CONTRAST TECHNIQUE: Multiplanar, multisequence MR imaging of the cervical spine was performed. No intravenous contrast was administered. COMPARISON:  MRI from 04/06/2024  FINDINGS: Alignment: Examination mildly limited as the patient was unable to tolerate the full length of the exam. No axial GRE sequence was performed. Straightening with slight reversal of the normal cervical lordosis. 2 mm retrolisthesis of C4 on C5, with trace anterolisthesis of C7 on T1, stable. Vertebrae: Vertebral body height maintained without acute or chronic fracture. Bone marrow signal intensity overall within normal limits. No worrisome osseous lesions. Again seen are signal changes concerning for possible osteomyelitis discitis at C4-5, and to a lesser extent at C5-6, not appreciably changed or progressed from prior. Again, no significant epidural involvement visible on this noncontrast examination. Paraspinous edema without loculated collection. Cord: Normal signal morphology.  No visible epidural collections. Posterior Fossa, vertebral arteries, paraspinal tissues: Patchy signal abnormality within the pons, most characteristic of chronic microvascular ischemic disease. Craniocervical junction within normal limits. Paraspinous edema centered about the C4-5 interspace without loculated collection. Preserved flow voids within the vertebral arteries bilaterally. Disc levels: Underlying multilevel cervical spondylosis, stable from recent MRI from 04/07/2019 fiber these changes are fully described. IMPRESSION: 1. No significant interval change in signal changes at C4-5, again concerning for possible infection with osteomyelitis discitis. More mild changes at C5-6, also potentially involved. Paraspinous edema without loculated soft tissue abscess. No visible significant epidural involvement on this noncontrast examination. 2. Underlying multilevel cervical spondylosis, stable from recent MRI from 04/07/2019. Electronically Signed: By: Morene Nicholes M.D. On: 04/08/2024 21:17   DG Wrist Complete Left Result Date: 04/08/2024 CLINICAL DATA:  Concern for infection. EXAM: DG WRIST COMPLETE 3+V*L*  COMPARISON:  None Available. FINDINGS: No acute fracture or dislocation. No significant arthritic change. Advanced atherosclerotic calcification of the vessel. The soft tissues are unremarkable. IMPRESSION: 1. No acute fracture or dislocation. 2. Advanced atherosclerotic calcification of the vessel. Electronically Signed   By: Vanetta Chou M.D.   On: 04/08/2024 17:17   DG Chest 2 View Result Date: 04/08/2024 CLINICAL DATA:  Weakness. EXAM: CHEST - 2 VIEW COMPARISON:  04/01/2024 FINDINGS: Sternotomy wires unchanged. Lungs are adequately inflated without lobar consolidation or effusion. Cardiomediastinal silhouette and remainder of the exam is unchanged. IMPRESSION: No acute cardiopulmonary disease. Electronically Signed   By: Toribio Agreste M.D.   On: 04/08/2024 15:13     Medications:    cefTRIAXone  (ROCEPHIN )  IV 2 g (04/10/24 0902)   [START ON 04/11/2024] DAPTOmycin     dialysis solution 1.5% low-MG/low-CA      amLODipine   10 mg Oral Daily   aspirin  EC  81 mg Oral Daily   carvedilol   3.125 mg Oral BID WC   Chlorhexidine  Gluconate Cloth  6 each Topical Daily   ezetimibe   10 mg Oral Daily   gentamicin  cream  1 Application Topical Daily   hydrALAZINE   100 mg Oral TID   insulin  aspart  0-5 Units Subcutaneous QHS   insulin  aspart  0-9 Units Subcutaneous TID WC   insulin  glargine-yfgn  16 Units Subcutaneous Daily   isosorbide  mononitrate  30 mg Oral BID   nicotine  21 mg Transdermal Daily   pantoprazole   40 mg Oral BID AC   sodium chloride  flush  10-40 mL Intracatheter Q12H   torsemide   60 mg Oral Daily    Assessment/ Plan:  64 year old male with history of hypertension, hyperlipidemia, diabetes, COPD, status post CABG, congestive heart failure, GERD, depression, peripheral neuropathy and end-stage renal disease on peritoneal dialysis admitted with neck pain and was found to have discitis and started on antibiotics with daptomycin and Rocephin .  Patient now has PICC line.  #1:  ESRD on peritoneal dialysis: Continue PD treatments as ordered.  #2: Sepsis/osteomyelitis of the cervical spine: Continue with Rocephin .  #3: Hypertension/congestive heart failure: Continue on amlodipine , hydralazine , carvedilol  and torsemide .  #4: Diabetes: Continue with insulin  protocols.  #5: Anemia: Anemia secondary to chronic kidney disease:  Labs and medications reviewed. Will continue to follow along with you.   LOS: 1 Pinkey Edman, MD [facility] 11/15/20251:38 PM

## 2024-04-10 NOTE — Plan of Care (Signed)

## 2024-04-10 NOTE — Progress Notes (Signed)
 Progress Note   Patient: [name] [name] FMW:[id number] DOB: Sep 15, 1960 DOA: 04/08/2024     1 DOS: the patient was seen and examined on 04/10/2024   Brief hospital course: [name] [name] is a 63 y.o. 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  Initial MRI of the cervical spine performed 11/11 showed a possible discitis at the C4-C5 level. Patient is seen by ID, neurosurgery, IR could not perform biopsy.  Neurosurgery deemed patient to be high risk for cervical spine biopsy.  After discussion with ID and nephrology, MRI with contrast was performed, showed discitis in the C4-C5. Antibiotics with daptomycin and Rocephin  was started. PICC line was also placed into brachial vein after approved by nephrology.    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  Clinically not septic.  Consulted with Dr. Fayette of ID, who recommended MRI with contrast and possible IR aspiration biopsy.  IR could not perform biopsy due to risks.  Patient also seen by neurosurgery, not recommended for biopsy due to risk of death from the procedure. After discussion with ID and Dr. Marcelino, MRI of the cervical  spine with contrast is performed: Confirmed discitis in the C4-C5. Had a discussion with Dr. Marea and Dr. Marcelino, a PICC line was placed into brachial vein.  This will not affect the future AV fistula. ID has started antibiotics with daptomycin and Rocephin . Discussed with TOC, patient can be discharged tomorrow after set up outpatient IV infusion.   Left wrist pain: Has a small wound in left wrist with surrounding erythema, with tenderness, suspecting infection.  X-ray of left wrist  is negative for bony fracture. MRI did not show any evidence of osteomyelitis.   Essential hypertension:  Continue home medicines.   ESRD on peritoneal dialysis [facility]): Followed by nephrology for peritoneal dialysis.   HLD (hyperlipidemia) -Zetia  - Patient is getting Repatha  injection   History of CAD (coronary artery disease):  Continue aspirin  and Zetia .   Chronic systolic heart failure (HCC): Today: 03/25/2023 showed a EF of 35-40% with grade 3 diastolic dysfunction.   Compensated, no exacerbation.   COPD (chronic obstructive pulmonary disease) (HCC): Stable -Bronchodilators as needed Mucinex    Depression with anxiety: - Hold nortriptyline  due to QTc prolongation 535   Diabetes mellitus with renal complication: Recent A1c 6.6, well-controlled.   After long discussion with the patient, he refused to let us  manage his diabetes.  He states that he has been managing his diabetes for more than 10 years, he has a better sense of how much insulin  he will need.  But he will let us  monitor his glucose.         Subjective:  Patient doing well, good appetite.  No short of breath.  Physical Exam: Vitals:  04/09/24 1926 04/09/24 2229 04/10/24 0500 04/10/24 0745  BP: 134/74 134/74  (!) 144/74  Pulse: 83   89  Resp: 15   17  Temp: 97.9 F (36.6 C)   97.6 F (36.4 C)  TempSrc:      SpO2: 93%   95%  Weight:   84 kg   Height:       General exam: Appears calm and comfortable  Respiratory system:  Clear to auscultation. Respiratory effort normal. Cardiovascular system: S1 & S2 heard, RRR. No JVD, murmurs, rubs, gallops or clicks. No pedal edema. Gastrointestinal system: Abdomen is nondistended, soft and nontender. No organomegaly or masses felt. Normal bowel sounds heard. Central nervous system: Alert and oriented. No focal neurological deficits. Extremities: Symmetric 5 x 5 power. Skin: No rashes, lesions or ulcers Psychiatry: Judgement and insight appear normal. Mood & affect appropriate.    Data Reviewed:  MRI results and lab results reviewed.  Family Communication: None  Disposition: Status is: Observation      Time spent: 35 minutes  Author: Murvin Mana, MD 04/10/2024 11:09 AM  For on call review [email or web address].

## 2024-04-11 DIAGNOSIS — M4622 Osteomyelitis of vertebra, cervical region: Secondary | ICD-10-CM | POA: Diagnosis not present

## 2024-04-11 DIAGNOSIS — Z992 Dependence on renal dialysis: Secondary | ICD-10-CM | POA: Diagnosis not present

## 2024-04-11 DIAGNOSIS — N2581 Secondary hyperparathyroidism of renal origin: Secondary | ICD-10-CM | POA: Diagnosis not present

## 2024-04-11 DIAGNOSIS — N186 End stage renal disease: Secondary | ICD-10-CM | POA: Diagnosis not present

## 2024-04-11 DIAGNOSIS — I10 Essential (primary) hypertension: Secondary | ICD-10-CM | POA: Diagnosis not present

## 2024-04-11 DIAGNOSIS — D631 Anemia in chronic kidney disease: Secondary | ICD-10-CM | POA: Diagnosis not present

## 2024-04-11 LAB — GLUCOSE, CAPILLARY
Glucose-Capillary: 136 mg/dL — ABNORMAL HIGH (ref 70–99)
Glucose-Capillary: 249 mg/dL — ABNORMAL HIGH (ref 70–99)

## 2024-04-11 MED ORDER — HEPARIN SODIUM (PORCINE) 1000 UNIT/ML IJ SOLN: INTRAPERITONEAL | Status: DC | PRN

## 2024-04-11 MED ORDER — HEPARIN SODIUM (PORCINE) 1000 UNIT/ML IJ SOLN
INTRAPERITONEAL | Status: DC | PRN
  Filled 2024-04-11: qty 2500

## 2024-04-11 MED ORDER — NICOTINE 21 MG/24HR TD PT24: 21.0000 mg | MEDICATED_PATCH | Freq: Every day | TRANSDERMAL | 0 refills | Status: DC

## 2024-04-11 MED ORDER — CEFTRIAXONE IV (FOR PTA / DISCHARGE USE ONLY): 2.0000 g | INTRAVENOUS | 0 refills | Status: AC

## 2024-04-11 MED ORDER — DAPTOMYCIN IV (FOR PTA / DISCHARGE USE ONLY): 700.0000 mg | INTRAVENOUS | 0 refills | Status: AC

## 2024-04-11 NOTE — Progress Notes (Signed)
 [facility]  PROGRESS NOTE   Subjective:   Feels much better.  Being discharged home today for IV antibiotics via PICC line.  Objective:  Vital signs: Blood pressure 139/70, pulse 88, temperature 98.1 F (36.7 C), resp. rate 17, height 6' 1 (1.854 m), weight 84 kg, SpO2 97%.  Intake/Output Summary (Last 24 hours) at 04/11/2024 1049 Last data filed at 04/11/2024 0930 Gross per 24 hour  Intake 480 ml  Output --  Net 480 ml   Filed Weights   04/09/24 0500 04/10/24 0500 04/11/24 0415  Weight: 83.8 kg 84 kg 84 kg     Physical Exam: General:  No acute distress  Head:  Normocephalic, atraumatic. Moist oral mucosal membranes  Eyes:  Anicteric  Neck:  Supple  Lungs:   Clear to auscultation, normal effort  Heart:  S1S2 no rubs  Abdomen:   Soft, nontender, bowel sounds present  Extremities:  peripheral edema.  Neurologic:  Awake, alert, following commands  Skin:  No lesions  Access:     Basic Metabolic Panel: Recent Labs  Lab 04/08/24 1421 04/09/24 0409  NA 135 136  K 4.0 3.9  CL 94* 97*  CO2 27 26  GLUCOSE 159* 207*  BUN 58* 62*  CREATININE 6.49* 6.81*  CALCIUM  9.2 8.8*   GFR: Estimated Creatinine Clearance: 12.4 mL/min (A) (by C-G formula based on SCr of 6.81 mg/dL (H)).  Liver Function Tests: Recent Labs  Lab 04/08/24 1421  AST 19  ALT 21  ALKPHOS 169*  BILITOT 0.4  PROT 6.6  ALBUMIN  3.3*   No results for input(s): LIPASE, AMYLASE in the last 168 hours. No results for input(s): AMMONIA in the last 168 hours.  CBC: Recent Labs  Lab 04/08/24 1421 04/09/24 0409  WBC 10.5 9.4  NEUTROABS 8.2*  --   HGB 11.9* 10.7*  HCT 36.1* 32.3*  MCV 96.8 96.4  PLT 291 267     HbA1C: Hemoglobin A1C  Date/Time Value Ref Range Status  11/20/2023 12:00 AM 6.6  Final  12/07/2020 09:59 AM 13.2 (A) 4.0 - 5.6 % Final  02/18/2020 10:51 AM 14.0 (A) 4.0 - 5.6 % Final   Hgb A1c MFr Bld  Date/Time Value Ref Range Status  03/25/2023 04:42 AM  7.6 (H) 4.8 - 5.6 % Final    Comment:    (NOTE) Pre diabetes:          5.7%-6.4%  Diabetes:              >6.4%  Glycemic control for   <7.0% adults with diabetes     Urinalysis: No results for input(s): COLORURINE, LABSPEC, PHURINE, GLUCOSEU, HGBUR, BILIRUBINUR, KETONESUR, PROTEINUR, UROBILINOGEN, NITRITE, LEUKOCYTESUR in the last 72 hours.  Invalid input(s): APPERANCEUR    Imaging: US  EKG SITE RITE Result Date: 04/09/2024 If Site Rite image not attached, placement could not be confirmed due to current cardiac rhythm.  MR CERVICAL SPINE W CONTRAST Result Date: 04/09/2024 EXAM: MRI CERVICAL SPINE WITH CONTRAST 04/09/2024 01:28:15 PM TECHNIQUE: Multiplanar multisequence MRI of the cervical spine was performed with the administration of intravenous contrast. CONTRAST: 8 mL GADAVIST COMPARISON: MRI of the cervical spine 04/08/2024. CLINICAL HISTORY: cervical osteomyelitis FINDINGS: BONES AND ALIGNMENT: Normal alignment. Normal vertebral body heights. Enhancement of the disc spaces and adjacent vertebral bodies from C4 to C6 consistent with osteomyelitis. SPINAL CORD: Normal spinal cord size. Normal spinal cord signal. No epidural abscess. SOFT TISSUES: Mild enhancement in the prevertebral soft tissues. IMPRESSION: 1. Enhancement of the disc spaces and  adjacent vertebral bodies from C4 to C6 consistent with osteomyelitis. 2. Mild prevertebral soft tissue enhancement without epidural or paraspinal abscess. Electronically signed by: Ryan Chess MD 04/09/2024 03:29 PM EST RP Workstation: [id number]     Medications:    cefTRIAXone  (ROCEPHIN )  IV 2 g (04/11/24 9075)   DAPTOmycin     dialysis solution 1.5% low-MG/low-CA      amLODipine   10 mg Oral Daily   aspirin  EC  81 mg Oral Daily   carvedilol   3.125 mg Oral BID WC   Chlorhexidine  Gluconate Cloth  6 each Topical Daily   ezetimibe   10 mg Oral Daily   gentamicin  cream  1 Application Topical Daily    hydrALAZINE   100 mg Oral TID   insulin  aspart  0-5 Units Subcutaneous QHS   insulin  aspart  0-9 Units Subcutaneous TID WC   insulin  glargine-yfgn  16 Units Subcutaneous Daily   isosorbide  mononitrate  30 mg Oral BID   nicotine  21 mg Transdermal Daily   pantoprazole   40 mg Oral BID AC   sodium chloride  flush  10-40 mL Intracatheter Q12H   torsemide   60 mg Oral Daily    Assessment/ Plan:     63 year old male with history of hypertension, hyperlipidemia, diabetes, COPD, status post CABG, congestive heart failure, GERD, depression, peripheral neuropathy and end-stage renal disease on peritoneal dialysis admitted with neck pain and was found to have discitis and started on antibiotics with daptomycin and Rocephin .  Patient now has PICC line.   #1: ESRD on peritoneal dialysis: Continue PD treatments as ordered.   #2: Sepsis/osteomyelitis of the cervical spine: Continue with Rocephin .   #3: Hypertension/congestive heart failure: Continue on amlodipine , hydralazine , carvedilol  and torsemide .   #4: Diabetes: Continue with insulin  protocols.   #5: Anemia: Anemia secondary to chronic kidney disease:  Labs and medications reviewed. Stable for discharge from renal standpoint.     LOS: 1 Pinkey Edman, MD [facility] 11/16/202510:49 AM

## 2024-04-11 NOTE — Plan of Care (Signed)

## 2024-04-11 NOTE — TOC Transition Note (Signed)
 Transition of Care [facility]) - Discharge Note   Patient Details  Name: [name] [name] MRN: [id number] Date of Birth: [birth year]  [facility]) CM/SW Contact:  Alvia Olam Fabry, RN Phone Number: 04/11/2024, 11:02 AM   Clinical Narrative:     Pt will discharged today after IV antibiotic infusions with plans for [facility]) confirming services on Monday and [facility] both alert that pt will discharge today for accepted services.  No additional needs presented at this time.        Patient Goals and CMS Choice            Discharge Placement                       Discharge Plan and Services Additional resources added to the After Visit Summary for                                       Social Drivers of Health (SDOH) Interventions SDOH Screenings   Food Insecurity: No Food Insecurity (04/09/2024)  Housing: Low Risk  (04/09/2024)  Transportation Needs: No Transportation Needs (04/09/2024)  Utilities: Not At Risk (04/09/2024)  Alcohol Screen: Low Risk  (04/17/2021)  Depression (PHQ2-9): Low Risk  (01/22/2023)  Financial Resource Strain: Low Risk  (10/30/2023)   Received from [facility]  Physical Activity: Unknown (09/09/2021)   Received from [facility]  Social Connections: Somewhat Isolated (09/09/2021)   Received from [facility]  Stress: No Stress Concern Present (02/23/2023)  Tobacco Use: High Risk (04/08/2024)  Health Literacy: Adequate Health Literacy (02/23/2023)     Readmission Risk Interventions     No data to display

## 2024-04-11 NOTE — Discharge Summary (Signed)
 Physician Discharge Summary   Patient: [name] [name] MRN: [id number] DOB: 08-07-1960  Admit date:     04/08/2024  Discharge date: 04/11/24  Discharge Physician: Murvin Mana   PCP: Liana Fish, NP   Recommendations at discharge:   Follow-up with PCP in 1 week. Follow-up with infectious disease in 1 month.  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 *  Hospital Course: [name] [name] is a 63 y.o. 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  Initial MRI of the cervical spine performed 11/11 showed a possible discitis at the C4-C5 level. Patient is seen by ID, neurosurgery, IR could not perform biopsy.  Neurosurgery deemed patient to be high risk for cervical spine biopsy.  After discussion with ID and nephrology, MRI with contrast was performed, showed discitis in the C4-C5. Antibiotics with daptomycin and Rocephin  was started. PICC line was also placed into brachial vein after approved by nephrology. Patient was started on daptomycin and Rocephin  by ID.  Home care has set up home infusion.  Patient medically stable for discharge.  Assessment and Plan: Osteomyelitis of cervical spine and hx of cervical foraminal stenosis (C4-5, C5-6) (Left): Patient does  not have fever or leukocytosis.  Clinically not septic.  Consulted with Dr. Fayette of ID, who recommended MRI with contrast and possible IR aspiration biopsy.  IR could not perform biopsy due to risks.  Patient also seen by neurosurgery, not recommended for biopsy due to risk of death from the procedure. After discussion with ID and Dr. Marcelino, MRI of the cervical spine with contrast is performed: Confirmed discitis in the C4-C5. Had a discussion with Dr. Marea and Dr. Marcelino, a PICC line was placed into brachial vein.  This will not affect the future AV fistula. ID has started antibiotics with daptomycin and Rocephin . Patient is medically stable for discharge.   Left wrist pain: Has a small wound in left wrist with surrounding erythema, with tenderness, suspecting infection.  X-ray of left wrist  is negative for bony fracture. MRI did not show any evidence of osteomyelitis.   Essential hypertension:  Continue home medicines.   ESRD on peritoneal dialysis [facility]): Continue home peritoneal dialysis.   HLD (hyperlipidemia) -Zetia  - Patient is getting Repatha  injection   History of CAD (coronary artery disease):  Continue aspirin  and Zetia .   Chronic systolic heart failure (HCC): Today: 03/25/2023 showed a EF of 35-40% with grade 3 diastolic dysfunction.   Compensated, no exacerbation.   COPD (chronic obstructive pulmonary disease) (HCC): Stable -Bronchodilators as needed Mucinex    Depression with anxiety: Resume home treatment.   Diabetes mellitus with renal complication: Recent A1c 6.6, well-controlled.   Patient is self managing insulin .        Consultants: ID, nephrology. Procedures performed: None  Disposition: Home health Diet recommendation:  Discharge Diet Orders (From admission, onward)  Start     Ordered   04/11/24 0000  Diet Carb Modified        04/11/24 1039           Carb modified diet DISCHARGE MEDICATION: Allergies as of 04/11/2024        Reactions   Iodinated Contrast Media Other (See Comments)   Kidney disease  Kidney disease  Kidney disease  Kidney disease   Other Other (See Comments)   Pain  With joint stiffness    Statins Other (See Comments)   Pain  With joint stiffness  Pain  With joint stiffness Other Reaction(s): Joint Pain   Pregabalin Other (See Comments)   Generalized aches and pains Generalized aches and pains Generalized aches and pains Generalized aches and pains Generalized aches and pains   Sacubitril-valsartan Other (See Comments)   Hyperkalemia Hyperkalemia Hyperkalemia  Hyperkalemia   Atorvastatin Other (See Comments)   Muscle aches Muscle aches   Pravastatin Other (See Comments)   Muscle aches Muscle aches   Rosuvastatin Other (See Comments)   Muscle Aches Other reaction(s): JOINT PAIN Other reaction(s): JOINT PAIN Muscle Aches        Medication List     STOP taking these medications    methocarbamol 500 MG tablet Commonly known as: ROBAXIN       TAKE these medications    albuterol  108 (90 Base) MCG/ACT inhaler Commonly known as: VENTOLIN  HFA Inhale 1-2 puffs into the lungs every 6 (six) hours as needed for wheezing or shortness of breath.   amLODipine  10 MG tablet Commonly known as: NORVASC  Take 1 tablet (10 mg total) by mouth daily.   aspirin  EC 81 MG tablet Take 81 mg by mouth daily. Swallow whole.   B-D ULTRAFINE III SHORT PEN 31G X 8 MM Misc Generic drug: Insulin  Pen Needle To use with pen basal and mealtime coverage insulin  pens 5 times daily. DX: E11.65   bisacodyl 5 MG EC tablet Commonly known as: DULCOLAX Take 10 mg by mouth 2 (two) times daily as needed for moderate constipation.   carvedilol  3.125 MG tablet Commonly known as: COREG  Take 1 tablet (3.125 mg total) by mouth 2 (two) times daily with a meal.   cefTRIAXone  IVPB Commonly known as: ROCEPHIN  Inject 2 g into the vein daily. Indication:  Cervical spine discitis First Dose: Yes Last Day of  Therapy:  05/20/2024 Labs - Once weekly:  CBC/D, CMP, CPK, ESR and CRP Fax weekly lab results  promptly to [phone] Method of administration: IV Push Method of administration may be changed at the discretion of home infusion pharmacist based upon assessment of the patient and/or caregiver's ability to self-administer the medication ordered. Radiology to remove central line after completion of antibiotic Call [phone] with any questions or critical value   cholecalciferol  25 MCG (1000 UNIT) tablet Commonly known as: VITAMIN D3 Take 2,000 Units by mouth 2 (two) times daily.   cloNIDine  0.1 MG tablet Commonly known as: Catapres  Take 1 tablet (0.1 mg total) by mouth 2 (two) times daily as needed (pressure >175).   daptomycin IVPB Commonly known as: CUBICIN Inject 700 mg into the vein every other day. Indication:  Cervical spine discitis First Dose: Yes Last Day of Therapy:  05/20/2024 Labs - Once weekly:  CBC/D, CMP, CPK, ESR and CRP Fax weekly lab results  promptly to [phone] Method of administration: IV Push Method of administration may be changed at the discretion of home infusion pharmacist based upon assessment of the patient  and/or caregiver's ability to self-administer the medication ordered. Radiology to remove central line after completion of antibiotic Call [phone] with any questions or critical value   ezetimibe  10 MG tablet Commonly known as: ZETIA  Take 1 tablet (10 mg total) by mouth daily.   gentamicin  cream 0.1 % Commonly known as: GARAMYCIN  Apply 1 Application topically 3 (three) times daily.   HumaLOG  Mix 75/25 KwikPen (75-25) 100 UNIT/ML KwikPen Generic drug: Insulin  Lispro Prot & Lispro Inject 20 Units into the skin every morning. 75-25 pen injectior   hydrALAZINE  100 MG tablet Commonly known as: APRESOLINE  Take 1 tablet (100 mg total) by mouth 3 (three) times daily. What changed: when to take this   insulin  lispro 100 UNIT/ML  KwikPen Commonly known as: HUMALOG  Inject 0-15 Units into the skin 3 (three) times daily.   isosorbide  mononitrate 30 MG 24 hr tablet Commonly known as: IMDUR  Take 1 tablet (30 mg total) by mouth 2 (two) times daily.   nicotine 21 mg/24hr patch Commonly known as: NICODERM CQ - dosed in mg/24 hours Place 1 patch (21 mg total) onto the skin daily. Start taking on: April 12, 2024   nitroGLYCERIN  0.4 MG SL tablet Commonly known as: NITROSTAT  Place 1 tablet (0.4 mg total) under the tongue every 5 (five) minutes as needed for chest pain.   nortriptyline  50 MG capsule Commonly known as: PAMELOR  Take 50 mg by mouth at bedtime.   pantoprazole  40 MG tablet Commonly known as: PROTONIX  Take 1 tablet (40 mg total) by mouth 2 (two) times daily before a meal.   Repatha  SureClick 140 MG/ML Soaj Generic drug: Evolocumab  INJECT 1 ML UNDER THE SKIN EVERY 14 DAYS   torsemide  20 MG tablet Commonly known as: DEMADEX  Take 1 tablet (20 mg total) by mouth 2 (two) times daily. What changed:  how much to take additional instructions   Toujeo  Max SoloStar 300 UNIT/ML Solostar Pen Generic drug: insulin  glargine (2 Unit Dial ) Inject 60 Units into the skin daily.               Discharge Care Instructions  (From admission, onward)           Start     Ordered   04/11/24 0000  Change dressing on IV access line weekly and PRN  (Home infusion instructions - [facility] )        04/11/24 1038   04/11/24 0000  Discharge wound care:       Comments: 04/09/24 2025    Exit site care:  Once      Comments: Clean skin near exit site with chloraprep swab sticks.  Starting at catheter, use circular pattern around exit site, moving towards outer edges of area covered by dressing.  Apply gentamicin  cream to site once daily.  Cover with dry dressing.  04/09/24 2026   04/09/24 1624    Exit site care:  Once      Comments: Clean skin near exit site with chloraprep swab sticks.  Starting at  catheter, use circular pattern around exit site, moving towards outer edges of area covered by dressing.  Apply gentamicin  cream to site once daily.  Cover with dry dressing.   04/11/24 1039            Contact information for follow-up providers     Abernathy, Mardy, NP Follow up in 1 week(s).   Specialty: Nurse Practitioner Contact information: [address] [place] [address] [phone]         Fayette,  Donald, MD Follow up in 1 month(s).   Specialty: Infectious Diseases Contact information: [address] [place] [address] [phone]              Contact information for after-discharge care     Home Medical Care     [facility]) .   Service: Home Health Services Contact information: [address] [place]  [address] [phone]                    Discharge Exam: Fredricka Weights   04/09/24 0500 04/10/24 0500 04/11/24 0415  Weight: 83.8 kg 84 kg 84 kg   General exam: Appears calm and comfortable  Respiratory system: Clear to auscultation. Respiratory effort normal. Cardiovascular system: S1 & S2 heard, RRR. No JVD, murmurs, rubs, gallops or clicks. No pedal edema. Gastrointestinal system: Abdomen is nondistended, soft and nontender. No organomegaly or masses felt. Normal bowel sounds heard. Central nervous system: Alert and oriented. No focal neurological deficits. Extremities: Symmetric 5 x 5 power. Skin: No rashes, lesions or ulcers Psychiatry: Judgement and insight appear normal. Mood & affect appropriate.    Condition at discharge: good  The results of significant diagnostics from this hospitalization (including imaging, microbiology, ancillary and laboratory) are listed below for reference.   Imaging Studies: US  EKG SITE RITE Result Date: 04/09/2024 If Site Rite image not attached, placement could not be confirmed due to current cardiac rhythm.  MR CERVICAL  SPINE W CONTRAST Result Date: 04/09/2024 EXAM: MRI CERVICAL SPINE WITH CONTRAST 04/09/2024 01:28:15 PM TECHNIQUE: Multiplanar multisequence MRI of the cervical spine was performed with the administration of intravenous contrast. CONTRAST: 8 mL GADAVIST COMPARISON: MRI of the cervical spine 04/08/2024. CLINICAL HISTORY: cervical osteomyelitis FINDINGS: BONES AND ALIGNMENT: Normal alignment. Normal vertebral body heights. Enhancement of the disc spaces and adjacent vertebral bodies from C4 to C6 consistent with osteomyelitis. SPINAL CORD: Normal spinal cord size. Normal spinal cord signal. No epidural abscess. SOFT TISSUES: Mild enhancement in the prevertebral soft tissues. IMPRESSION: 1. Enhancement of the disc spaces and adjacent vertebral bodies from C4 to C6 consistent with osteomyelitis. 2. Mild prevertebral soft tissue enhancement without epidural or paraspinal abscess. Electronically signed by: Ryan Chess MD 04/09/2024 03:29 PM EST RP Workstation: [id number]   MR WRIST LEFT WO CONTRAST Result Date: 04/09/2024 EXAM: MRI OF THE LEFT WRIST WITHOUT INTRAVENOUS CONTRAST 04/08/2024 11:32:20 PM TECHNIQUE: Multiplanar multisequence MRI of the left wrist was performed without the administration of intravenous contrast. COMPARISON: Radiographs 04/08/2024. CLINICAL HISTORY: Wrist pain, current discitis-osteomyelitis in the cervical spine; Recent perianal abscess drainage; Small wound along the left wrist; End stage renal disease and diabetes. FINDINGS: LIMITATIONS/ARTIFACTS: Motion artifact is present, reducing diagnostic sensitivity and specificity. INTRINSIC LIGAMENTS: Intact scapholunate and lunotriquetral ligaments without interval widening. Tear of a small dorsal ligament on the Lunate. No significant periligamentous edema. EXTRINSIC LIGAMENTS: The volar extrinsic ligaments including the radio-scapho-capitate and radio-luno-triquetral ligaments are grossly intact. TFCC: The triangulofibrocartilage complex  is preserved including the articular disc, the meniscal homologue, the extensor carpi ulnas tendon, the volar and dorsal distal radioulnar ligaments, and the ulnolunate and ulnotriquetral ligaments. EXTENSOR TENDONS: Intact extensor tendons without tenosynovitis or tear. CARPAL TUNNEL AND FLEXOR TENDONS: Intact flexor tendons and flexor retinaculum. Normal MRI appearance of the median nerve. Intact ulnar nerve in Guyon's Canal. The suspected ganglion cyst (described in SOFT TISSUE) is noted in proximity to the [employer] Tendon. JOINT SPACES: Mild degenerative arthropathy at the 1st Carpometacarpal  Articulation. No significant joint effusion. Unremarkable alignment. BONE MARROW: No substantial marrow edema is identified to indicate osteomyelitis involving the wrist. No acute fracture or aggressive marrow replacing lesion. SOFT TISSUE: Suspected 1.1 x 0.3 x 0.9 cm ganglion cyst along the anterior rim of the Radius at the Radiocarpal Articulation as shown on image 19 series 11 tracking medial to the Radial Artery in proximity to the Flexor Carpi Radialis Tendon. No significant soft tissue edema or fluid collections other than the described cyst. No significant muscle edema or atrophy. VASCULATURE: Low signal in the walls of the Radial and Ulnar Arteries compatible with atheromatous vascular calcification. Patency not assessed. IMPRESSION: 1. No evidence of osteomyelitis involving the left wrist. 2. Mild degenerative arthropathy at the first carpometacarpal articulation. 3. Suspected 1.1 x 0.3 x 0.9 cm ganglion cyst along the anterior rim of the radius at the radiocarpal articulation, tracking medial to the radial artery but adjacent to the flexor carpi radialis. 4. Low signal in the walls of the radial and ulnar arteries compatible with atheromatous vascular calcification. Patency not assessed. Electronically signed by: Ryan Salvage MD 04/09/2024 08:36 AM EST RP Workstation: [id number]   MR CERVICAL  SPINE WO CONTRAST Addendum Date: 04/09/2024 ADDENDUM REPORT: 04/09/2024 01:48 ADDENDUM: Since time of initial dictation, the case was discussed with the covering overnight hospitalist, Dr. Hilma. Per the provided history, patient is clinically not septic and is only with mild neck pain (although the provided clinical indication for the initial MRI from 04/06/2024 was for infection). Given this, it is possible that the signal changes at the C4-5 level are potentially degenerative in nature, although the presence of paraspinous edema is not typically seen in the setting of degenerative changes alone. Possible infection with osteomyelitis discitis should still be considered until proven otherwise. Additionally, apparently this exam was ordered as a with and without study. Patient was unable to tolerate the full length of the exam and terminated the study early, with no contrast given. Irregardless, the patient history of renal disease with low GFR largely precludes the administration of IV gadolinium contrast. Electronically Signed   By: Morene Hoard M.D.   On: 04/09/2024 01:48   Result Date: 04/09/2024 CLINICAL DATA:  Initial evaluation for acute neck pain. EXAM: MRI CERVICAL SPINE WITHOUT CONTRAST TECHNIQUE: Multiplanar, multisequence MR imaging of the cervical spine was performed. No intravenous contrast was administered. COMPARISON:  MRI from 04/06/2024 FINDINGS: Alignment: Examination mildly limited as the patient was unable to tolerate the full length of the exam. No axial GRE sequence was performed. Straightening with slight reversal of the normal cervical lordosis. 2 mm retrolisthesis of C4 on C5, with trace anterolisthesis of C7 on T1, stable. Vertebrae: Vertebral body height maintained without acute or chronic fracture. Bone marrow signal intensity overall within normal limits. No worrisome osseous lesions. Again seen are signal changes concerning for possible osteomyelitis discitis at C4-5, and to  a lesser extent at C5-6, not appreciably changed or progressed from prior. Again, no significant epidural involvement visible on this noncontrast examination. Paraspinous edema without loculated collection. Cord: Normal signal morphology.  No visible epidural collections. Posterior Fossa, vertebral arteries, paraspinal tissues: Patchy signal abnormality within the pons, most characteristic of chronic microvascular ischemic disease. Craniocervical junction within normal limits. Paraspinous edema centered about the C4-5 interspace without loculated collection. Preserved flow voids within the vertebral arteries bilaterally. Disc levels: Underlying multilevel cervical spondylosis, stable from recent MRI from 04/07/2019 fiber these changes are fully described. IMPRESSION: 1. No significant interval change in signal  changes at C4-5, again concerning for possible infection with osteomyelitis discitis. More mild changes at C5-6, also potentially involved. Paraspinous edema without loculated soft tissue abscess. No visible significant epidural involvement on this noncontrast examination. 2. Underlying multilevel cervical spondylosis, stable from recent MRI from 04/07/2019. Electronically Signed: By: Morene Hoard M.D. On: 04/08/2024 21:17   DG Wrist Complete Left Result Date: 04/08/2024 CLINICAL DATA:  Concern for infection. EXAM: DG WRIST COMPLETE 3+V*L* COMPARISON:  None Available. FINDINGS: No acute fracture or dislocation. No significant arthritic change. Advanced atherosclerotic calcification of the vessel. The soft tissues are unremarkable. IMPRESSION: 1. No acute fracture or dislocation. 2. Advanced atherosclerotic calcification of the vessel. Electronically Signed   By: Vanetta Chou M.D.   On: 04/08/2024 17:17   DG Chest 2 View Result Date: 04/08/2024 CLINICAL DATA:  Weakness. EXAM: CHEST - 2 VIEW COMPARISON:  04/01/2024 FINDINGS: Sternotomy wires unchanged. Lungs are adequately inflated without  lobar consolidation or effusion. Cardiomediastinal silhouette and remainder of the exam is unchanged. IMPRESSION: No acute cardiopulmonary disease. Electronically Signed   By: Toribio Agreste M.D.   On: 04/08/2024 15:13   MR CERVICAL SPINE WO CONTRAST Result Date: 04/08/2024 CLINICAL DATA:  Initial evaluation for cervical radiculopathy, infection suspected. EXAM: MRI CERVICAL SPINE WITHOUT CONTRAST TECHNIQUE: Multiplanar, multisequence MR imaging of the cervical spine was performed. No intravenous contrast was administered. COMPARISON:  Radiograph from 03/25/2024. FINDINGS: Alignment: Mild reversal of the normal cervical lordosis. 2 mm retrolisthesis of C4 on C5, with trace anterolisthesis of C7 on T1. Vertebrae: Vertebral body height maintained without acute or chronic fracture. Bone marrow signal intensity within normal limits. No worrisome osseous lesions. Prominent marrow edema present within the C4 and C5 vertebral bodies. Adjacent paraspinous edema and inflammation. Findings are highly suspicious for possible osteomyelitis discitis at this level. Similar but more mild changes present about the C5-6 level as well, also potentially involved. No visible epidural involvement. Paraspinous edema without loculated collection. Cord: Grossly normal signal and morphology on this motion degraded exam. Posterior Fossa, vertebral arteries, paraspinal tissues: Patchy signal abnormality within the pons, most characteristic of chronic microvascular ischemic disease. Craniocervical junction normal. Paraspinous edema adjacent to the C4-5 interspace. Preserved flow voids within the vertebral arteries bilaterally. Right pleural effusion partially visualized. Disc levels: C2-C3: Minimal disc bulge. Mild bilateral facet hypertrophy. No stenosis. C3-C4: Right paracentral disc protrusion indents the ventral thecal sac. Mild bilateral uncovertebral and facet hypertrophy. No spinal stenosis. Foramina remain patent. C4-C5:  Degenerative intervertebral disc space narrowing with findings concerning for osteomyelitis discitis. Flattening and partial effacement of the ventral thecal sac with mild cord flattening. Mild to moderate spinal stenosis. Severe left with moderate right C5 foraminal narrowing. C5-C6: Degenerative vertebral disc space narrowing with diffuse disc osteophyte complex. Flattening of the ventral thecal sac with resultant mild spinal stenosis. Foramina remain patent. C6-C7: Mild disc bulge with uncovertebral spurring, asymmetric to the right. Flattening of the ventral thecal sac without significant spinal stenosis. Mild right C7 foraminal narrowing. Left neural foramina remains patent. C7-T1: Negative interspace. Mild right-sided facet hypertrophy. No canal or foraminal stenosis. IMPRESSION: 1. Findings concerning for acute osteomyelitis discitis at the C4-5 level. Associated paraspinous edema without loculated collection. No visible epidural involvement. 2. Similar but more mild changes about the C5-6 level, also potentially involved. 3. Multilevel cervical spondylosis with resultant mild to moderate spinal stenosis at C4-5 and C5-6. Severe left with moderate right C5 foraminal narrowing. 4. Right pleural effusion, partially visualized. These results will be called to the ordering  clinician or representative by the Radiologist Assistant, and communication documented in the PACS or [employer]. Electronically Signed   By: Morene Hoard M.D.   On: 04/08/2024 05:12   CT Chest Wo Contrast Result Date: 04/01/2024 EXAM: CT CHEST WITHOUT CONTRAST 04/01/2024 06:12:34 AM TECHNIQUE: CT of the chest was performed without the administration of intravenous contrast. Multiplanar reformatted images are provided for review. Automated exposure control, iterative reconstruction, and/or weight based adjustment of the mA/kV was utilized to reduce the radiation dose to as low as reasonably achievable. COMPARISON: 11/05/2019  status post CABG procedure. CLINICAL HISTORY: Respiratory illness, nondiagnostic xray. FINDINGS: MEDIASTINUM: Mild cardiac enlargement. Aortic atherosclerosis. No pericardial effusion. The central airways are clear. LYMPH NODES: Similar appearance of increased multiplicity of prominent mediastinal lymph nodes. Lymph nodes measure up to 1.3 cm in short axis, unchanged from prior examination. This is a nonspecific finding in the setting of CHF. Hilar lymph nodes suboptimally evaluated due to lack of IV contrast. No axillary lymphadenopathy. LUNGS AND PLEURA: Small to moderate right pleural effusion and small left pleural effusion. Mild interstitial thickening is identified with a lower lung zone predominance concerning for mild edema. Emphysema with diffuse bronchial wall thickening. Subsegmental atelectasis noted within the posterior right base. Unchanged scarring within the right middle lobe. Posterior left upper lobe subpleural nodule is unchanged, measuring 4 mm (image 25/3). Subpleural nodule in the anterior right upper lobe is unchanged, measuring 3 mm (image 48/3). Right lower lobe lung nodule is unchanged, measuring 5 mm. These are compatible with a benign process requiring no further follow-up. No pneumothorax. SOFT TISSUES/BONES: Status post median sternotomy. No acute abnormality of the bones or soft tissues. UPPER ABDOMEN: Upper abdominal ascites noted. IMPRESSION: 1. Mild interstitial thickening with lower lung zone predominance, compatible with mild pulmonary edema. 2. Small to moderate right pleural effusion and small left pleural effusion. 3. Upper abdominal ascites. 4. Small scattered lung nodules are unchanged from the previous exam and require no further follow-up. Electronically signed by: Waddell Calk MD 04/01/2024 06:38 AM EST RP Workstation: [id number]   DG Chest Portable 1 View Result Date: 04/01/2024 EXAM: 1 VIEW(S) XRAY OF THE CHEST 04/01/2024 04:25:43 AM COMPARISON: 03/24/2023 CLINICAL  HISTORY: 63 year old male with acute dyspnea and history of peritoneal dialysis. FINDINGS: LUNGS AND PLEURA: Low lung volumes. Patchy right lung base opacity most resembles atelectasis. Increased pulmonary interstitium elsewhere, mild or developing interstitial edema not excluded. No pleural effusion. No pneumothorax. HEART AND MEDIASTINUM: Prior sternotomy. Mediastinal contours are stable and within normal limits. Calcified aortic atherosclerosis. BONES AND SOFT TISSUES: Prior sternotomy. No acute osseous abnormality. IMPRESSION: 1. Mild or developing interstitial edema cannot be excluded. 2. Patchy right lung base opacity most resembles atelectasis. Electronically signed by: Helayne Hurst MD 04/01/2024 05:09 AM EST RP Workstation: [id number]   DG Cervical Spine 2 or 3 views Result Date: 03/28/2024 EXAM: 2 or 3 VIEW(S) XRAY OF THE CERVICAL SPINE 03/25/2024 01:24:03 PM COMPARISON: 03/27/2020 CLINICAL HISTORY: pain FINDINGS: BONES: No acute fracture. No aggressive appearing osseous lesion. Stable mild grade 1 retrolisthesis of C4-C5 secondary to severe degenerative disc disease at this level. DISCS AND DEGENERATIVE CHANGES: Severe degenerative disc disease at C4-C5 and C5-C6. SOFT TISSUES: No prevertebral soft tissue swelling. Right carotid artery calcifications are noted. The visualized lungs appear clear. IMPRESSION: 1. Stable mild grade 1 retrolisthesis of C4-5, likely degenerative in etiology. 2. Severe degenerative disc disease at C5-6. 3. Right carotid artery calcifications. Carotid ultrasound is recommended for further evaluation. Electronically signed by: Lynwood Seip MD  03/28/2024 05:35 PM EST RP Workstation: [id number]   DG Shoulder Left Result Date: 03/27/2024 EXAM: 1 VIEW XRAY OF THE LEFT SHOULDER 03/25/2024 01:24:03 PM COMPARISON: None available. CLINICAL HISTORY: FINDINGS: BONES AND JOINTS: Glenohumeral joint is normally aligned. No acute fracture or dislocation. Mild degenerative changes of the  acromioclavicular joint. SOFT TISSUES: Median sternotomy noted. Aortic arch calcifications. Visualized lung is unremarkable. IMPRESSION: 1. No acute osseous abnormality of the shoulder. 2. Mild acromioclavicular joint osteoarthrosis. 3. Aortic arch atherosclerosis. Electronically signed by: Katheleen Faes MD 03/27/2024 01:43 PM EDT RP Workstation: [id number]    Microbiology: Results for orders placed or performed during the hospital encounter of 04/08/24  Culture, blood (Routine X 2) w Reflex to ID Panel     Status: None (Preliminary result)   Collection Time: 04/08/24  8:57 PM   Specimen: BLOOD  Result Value Ref Range Status   Specimen Description BLOOD BLOOD LEFT WRIST  Final   Special Requests   Final    BOTTLES DRAWN AEROBIC AND ANAEROBIC Blood Culture results may not be optimal due to an inadequate volume of blood received in culture bottles   Culture   Final    NO GROWTH 2 DAYS Performed at [facility], [address] [place], [place] [address]    Report Status PENDING  Incomplete  Culture, blood (Routine X 2) w Reflex to ID Panel     Status: None (Preliminary result)   Collection Time: 04/08/24  8:57 PM   Specimen: BLOOD  Result Value Ref Range Status   Specimen Description BLOOD BLOOD LEFT FOREARM  Final   Special Requests   Final    BOTTLES DRAWN AEROBIC ONLY Blood Culture results may not be optimal due to an inadequate volume of blood received in culture bottles   Culture   Final    NO GROWTH 2 DAYS Performed at [facility], [address], [place], [place] [address]    Report Status PENDING  Incomplete  Aerobic Culture w Gram Stain (superficial specimen)     Status: None (Preliminary result)   Collection Time: 04/09/24  4:16 PM   Specimen: WRIST; Wound  Result Value Ref Range Status   Specimen Description   Final    WRIST LEFT Performed at [facility], [address], [place], [place] [address]    Special Requests   Final     NONE Performed at [facility], [address] [place], [place] [address]    Gram Stain   Final    NO WBC SEEN ABUNDANT YEAST Performed at [facility], [address], [place], [place] [address]    Culture PENDING  Incomplete   Report Status PENDING  Incomplete    Labs: CBC: Recent Labs  Lab 04/08/24 1421 04/09/24 0409  WBC 10.5 9.4  NEUTROABS 8.2*  --   HGB 11.9* 10.7*  HCT 36.1* 32.3*  MCV 96.8 96.4  PLT 291 267   Basic Metabolic Panel: Recent Labs  Lab 04/08/24 1421 04/09/24 0409  NA 135 136  K 4.0 3.9  CL 94* 97*  CO2 27 26  GLUCOSE 159* 207*  BUN 58* 62*  CREATININE 6.49* 6.81*  CALCIUM  9.2 8.8*   Liver Function Tests: Recent Labs  Lab 04/08/24 1421  AST 19  ALT 21  ALKPHOS 169*  BILITOT 0.4  PROT 6.6  ALBUMIN  3.3*   CBG: Recent Labs  Lab 04/10/24 0746 04/10/24 1130 04/10/24 1638 04/10/24 2119 04/11/24 0757  GLUCAP 213* 327* 194*  189* 136*    Discharge time spent: 35 minutes.  Signed: Murvin Mana, MD [facility] 04/11/2024

## 2024-04-12 DIAGNOSIS — M4622 Osteomyelitis of vertebra, cervical region: Secondary | ICD-10-CM | POA: Diagnosis not present

## 2024-04-12 DIAGNOSIS — N186 End stage renal disease: Secondary | ICD-10-CM | POA: Diagnosis not present

## 2024-04-12 DIAGNOSIS — Z992 Dependence on renal dialysis: Secondary | ICD-10-CM | POA: Diagnosis not present

## 2024-04-12 LAB — AEROBIC CULTURE W GRAM STAIN (SUPERFICIAL SPECIMEN): Gram Stain: NONE SEEN

## 2024-04-12 NOTE — Progress Notes (Signed)
 D/C order noted. PD pt followed by [facility] advised of pt and d/c. Requested documents faxed to clinic for continuation of care.  Suzen Satchel Dialysis Navigator [phone]

## 2024-04-13 ENCOUNTER — Encounter: Payer: Self-pay | Admitting: Nurse Practitioner

## 2024-04-13 ENCOUNTER — Ambulatory Visit: Admitting: Nurse Practitioner

## 2024-04-13 VITALS — BP 151/73 | HR 82 | Temp 98.2°F | Resp 16 | Ht 74.0 in | Wt 185.4 lb

## 2024-04-13 DIAGNOSIS — B379 Candidiasis, unspecified: Secondary | ICD-10-CM

## 2024-04-13 DIAGNOSIS — Z09 Encounter for follow-up examination after completed treatment for conditions other than malignant neoplasm: Secondary | ICD-10-CM

## 2024-04-13 DIAGNOSIS — Z792 Long term (current) use of antibiotics: Secondary | ICD-10-CM | POA: Diagnosis not present

## 2024-04-13 DIAGNOSIS — Z95828 Presence of other vascular implants and grafts: Secondary | ICD-10-CM

## 2024-04-13 DIAGNOSIS — Z992 Dependence on renal dialysis: Secondary | ICD-10-CM | POA: Diagnosis not present

## 2024-04-13 DIAGNOSIS — S61502A Unspecified open wound of left wrist, initial encounter: Secondary | ICD-10-CM | POA: Diagnosis not present

## 2024-04-13 DIAGNOSIS — M4622 Osteomyelitis of vertebra, cervical region: Secondary | ICD-10-CM | POA: Diagnosis not present

## 2024-04-13 DIAGNOSIS — N186 End stage renal disease: Secondary | ICD-10-CM | POA: Diagnosis not present

## 2024-04-13 LAB — CULTURE, BLOOD (ROUTINE X 2)
Culture: NO GROWTH
Culture: NO GROWTH

## 2024-04-13 MED ORDER — ALPRAZOLAM 0.25 MG PO TABS: 0.2500 mg | ORAL_TABLET | Freq: Two times a day (BID) | ORAL | 0 refills | Status: DC | PRN

## 2024-04-13 MED ORDER — OXYCODONE HCL 5 MG PO TABS: 5.0000 mg | ORAL_TABLET | Freq: Two times a day (BID) | ORAL | 0 refills | Status: DC | PRN

## 2024-04-13 NOTE — Progress Notes (Signed)
 [facility] [place], [place] [address] [place] [address] [phone]                                   Transitional Care Clinic   [facility] Discharge Acute Issues Care Follow Up                                                                        Patient Demographics  [name] [name], is a 63 y.o. male  DOB 22-Jun-1960  MRN [id number].  Primary MD  Liana Fish, NP  Admit date:     04/08/2024  Discharge date: 04/11/24    Reason for TCC follow Up - osteomyelitis of cervical spine   Past Medical History:  Diagnosis Date   Anemia    Arthritis    CAD (coronary artery disease)    a. 12/2016 s/p CABG x 4 (LIMA->LAD, VG->RCA, VG->OM1, VG->D1); b. 02/2018 MV: EF 35%, small-med inferolaterlal infarct. No ischemia.   CKD (chronic kidney disease), stage IV [facility])    Peritoneal Dialysis   Colon polyps    COPD (chronic obstructive pulmonary disease) (HCC)    Dupre's syndrome    GERD (gastroesophageal reflux disease)    HFrEF (heart failure reduced ejection fraction) (HCC)    a.) 2018 EF 35%; b.) 02/2018 EF 50%; c). 02/2019 EF 40-45%; d.) 08/2019 Echo: EF 50-55%, no rwma, GrII DD; e.) 04/2020 Echo: EF 30-35%, mod-sev glob HK. Mild LVH. G2DD. Low-nl RV fxn. Mildly dil LA. Mild MR. Mild-mod Ao sclerosis w/o stenosis; f.) TTE 02/09/2021: EF 55%; septal HK, LAE; G2DD.   History of 2019 novel coronavirus disease (COVID-19) 02/08/2021   Hyperlipidemia    Hypertension    Ischemic cardiomyopathy    a.) 02/2018 MV: EF 35%; b.) 08/2019 Echo: EF 50-55%; c.) 04/2020 Echo: EF 30-35%; d.) TTE 02/09/2021: EF 50-55%.   Lymphedema    Migraine    Myocardial infarction (HCC)    Neuropathy    PAD (peripheral artery disease)    a. 04/2017 Aortoiliac duplex: R iliac dzs; b. 03/2020 LE Duplex: mild bilat atherosclerosis throughout. Patent vessels.   Pneumonia    Retinopathy    S/P CABG x 4    a.)  4v CABG: LIMA->LAD, SVG->RCA, SVG->OM1, SVG->D1   Sleep apnea     a.) does not require nocturnal PAP therapy   Statin intolerance    Stomach ulcer    T2DM (type 2 diabetes mellitus) (HCC)     Past Surgical History:  Procedure Laterality Date   bone graft surgery Bilateral    on both feet x 2   CAPD INSERTION N/A 11/13/2021   Procedure: LAPAROSCOPIC INSERTION CONTINUOUS AMBULATORY PERITONEAL DIALYSIS  (CAPD) CATHETER;  Surgeon: Jordis Laneta FALCON, MD;  Location: [facility] ORS;  Service: General;  Laterality: N/A;   CAPD REVISION N/A 01/10/2022   Procedure: LAPAROSCOPIC REVISION CONTINUOUS AMBULATORY PERITONEAL DIALYSIS  (CAPD) CATHETER;  Surgeon: Jordis Laneta FALCON, MD;  Location: [facility] ORS;  Service: General;  Laterality: N/A;   CARDIAC CATHETERIZATION  2013   S/p PCI    CARDIAC CATHETERIZATION  2018   S/p CABG   CATARACT EXTRACTION Bilateral    COLONOSCOPY  COLONOSCOPY WITH PROPOFOL  N/A 10/17/2022   Procedure: COLONOSCOPY WITH PROPOFOL ;  Surgeon: Jinny Carmine, MD;  Location: [facility] ENDOSCOPY;  Service: Endoscopy;  Laterality: N/A;   CORONARY ARTERY BYPASS GRAFT  2018   (LIMA-LAD,VG-RCA,VG-OM1,VG-D1)   ESOPHAGOGASTRODUODENOSCOPY (EGD) WITH PROPOFOL  N/A 04/04/2020   Procedure: ESOPHAGOGASTRODUODENOSCOPY (EGD) WITH PROPOFOL ;  Surgeon: Jinny Carmine, MD;  Location: [facility] ENDOSCOPY;  Service: Endoscopy;  Laterality: N/A;   ESOPHAGOGASTRODUODENOSCOPY (EGD) WITH PROPOFOL  N/A 06/06/2020   Procedure: ESOPHAGOGASTRODUODENOSCOPY (EGD) WITH PROPOFOL ;  Surgeon: Jinny Carmine, MD;  Location: [facility] ENDOSCOPY;  Service: Endoscopy;  Laterality: N/A;   ESOPHAGOGASTRODUODENOSCOPY (EGD) WITH PROPOFOL  N/A 09/24/2022   Procedure: ESOPHAGOGASTRODUODENOSCOPY (EGD) WITH PROPOFOL ;  Surgeon: Jinny Carmine, MD;  Location: [facility] ENDOSCOPY;  Service: Endoscopy;  Laterality: N/A;   INCISION AND DRAINAGE OF WOUND Left 04/23/2024   Procedure: IRRIGATION AND DEBRIDEMENT WOUND;  Surgeon: Gust Molly, DO;  Location: [facility] ORS;  Service: Orthopedics;  Laterality: Left;  LEFT WRIST DEBRIDEMENT OF WOUND    PERIPHERAL VASCULAR CATHETERIZATION Right 10/28/2016   PTA/DEB Right SFA   WRIST SURGERY Left    cyst       Recent HPI and Hospital Course  Hospital Course: Loel Betancur is a 63 y.o. 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  Initial MRI of the cervical spine performed 11/11 showed a possible discitis at the C4-C5 level. Patient is seen by ID, neurosurgery, IR could not perform biopsy.  Neurosurgery deemed patient to be high risk for cervical spine biopsy.  After discussion with ID and nephrology, MRI with contrast was performed, showed discitis in the C4-C5. Antibiotics with daptomycin  and Rocephin  was started. PICC line was also placed into brachial vein after approved by nephrology. Patient was started on daptomycin  and Rocephin  by ID.  Home care has set up home infusion.  Patient medically stable for discharge.   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  Clinically not septic.  Consulted with Dr. Fayette of ID, who recommended MRI with contrast and possible IR aspiration biopsy.  IR could not perform biopsy due to risks.  Patient also seen by neurosurgery, not recommended for biopsy due to risk of death from the procedure. After discussion with ID and Dr. Marcelino, MRI of the cervical spine with contrast is performed: Confirmed discitis in the C4-C5. Had a discussion with Dr. Marea and Dr. Marcelino, a PICC line was placed into brachial vein.  This will not affect the future AV fistula. ID has started antibiotics with daptomycin  and Rocephin . Patient is medically stable for discharge.   Left wrist pain: Has a small wound  in left wrist with surrounding erythema, with tenderness, suspecting infection.  X-ray of left wrist  is negative for bony fracture. MRI did not show any evidence of osteomyelitis.   Essential hypertension:  Continue home medicines.   ESRD on peritoneal dialysis [facility]): Continue home peritoneal dialysis.   HLD (hyperlipidemia) -Zetia  - Patient is getting Repatha  injection   History of CAD (coronary artery disease):  Continue aspirin  and Zetia .   Chronic systolic heart failure (HCC): Today: 03/25/2023 showed a EF of 35-40% with grade 3 diastolic dysfunction.   Compensated, no exacerbation.   COPD (chronic obstructive pulmonary disease) (HCC): Stable -Bronchodilators as  needed Mucinex    Depression with anxiety: Resume home treatment.   Diabetes mellitus with renal complication: Recent A1c 6.6, well-controlled.   Patient is self managing insulin .  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   Assessment & Plan   1. Osteomyelitis of cervical spine (HCC) (Primary) Treated in the hospital and has a PICC line and will be receiving antibiotics at home for several weeks   2. Open wound of left wrist, initial encounter Open wound on left wrist with black wound bed, culture in hospital was positive for yeast. Dr. Reno, infectious disease, notified and will have her office contact the  patient.   3. Candida albicans infection Found in wound on left wrist, infectious disease specialist notified.   4. S/P PICC central line placement Picc line dressing is clean and intact, no issues. He will receive intravenous antibiotics for several weeks.   5. Receiving intravenous antibiotic treatment at home See problem 1 and 4  6. Hospital discharge follow-up Treated in hospital for cervical osteomyelitis.     Reason for frequent admissions/ER visits    COPD ESRD On CAPD Chronic systolic HF Osteomyelitis of cervical spine   Objective:   Vitals:   04/13/24 1422  BP: (!) 151/73  Pulse: 82  Resp: 16  Temp: 98.2 F (36.8 C)  SpO2: 96%  Weight: 185 lb 6.4 oz (84.1 kg)  Height: 6' 2 (1.88 m)    Wt Readings from Last 3 Encounters:  05/17/24 163 lb 5.8 oz (74.1 kg)  04/27/24 185 lb 6.5 oz (84.1 kg)  04/23/24 185 lb 6.5 oz (84.1 kg)    Allergies as of 04/13/2024       Reactions   Iodinated Contrast Media Other (See Comments)   Kidney disease  Kidney disease  Kidney disease  Kidney disease   Other Other (See Comments)   Pain  With joint stiffness    Statins Other (See Comments)   Pain  With joint stiffness  Pain  With joint stiffness Other Reaction(s): Joint Pain   Pregabalin Other (See Comments)   Generalized aches and pains Generalized aches and pains Generalized aches and pains Generalized aches and pains Generalized aches and pains   Sacubitril-valsartan Other (See Comments)   Hyperkalemia Hyperkalemia Hyperkalemia  Hyperkalemia   Atorvastatin Other (See Comments)   Muscle aches Muscle aches   Pravastatin Other (See Comments)   Muscle aches Muscle aches   Rosuvastatin Other (See Comments)   Muscle Aches Other reaction(s): JOINT PAIN Other reaction(s): JOINT PAIN Muscle Aches        Medication List        Accurate as of April 13, 2024 11:59 PM. If you have any questions, ask your nurse or doctor.          albuterol  108 (90  Base) MCG/ACT inhaler Commonly known as: VENTOLIN  HFA Inhale 1-2 puffs into the lungs every 6 (  six) hours as needed for wheezing or shortness of breath.   ALPRAZolam  0.25 MG tablet Commonly known as: XANAX  Take 1 tablet (0.25 mg total) by mouth 2 (two) times daily as needed (SEVERE anxiety/panic attacks). Started by: Mischa Pollard, NP   amLODipine  10 MG tablet Commonly known as: NORVASC  Take 1 tablet (10 mg total) by mouth daily.   aspirin  EC 81 MG tablet Take 81 mg by mouth daily. Swallow whole.   B-D ULTRAFINE III SHORT PEN 31G X 8 MM Misc Generic drug: Insulin  Pen Needle To use with pen basal and mealtime coverage insulin  pens 5 times daily. DX: E11.65   bisacodyl  5 MG EC tablet Commonly known as: DULCOLAX Take 10 mg by mouth 2 (two) times daily as needed for moderate constipation.   carvedilol  3.125 MG tablet Commonly known as: COREG  Take 1 tablet (3.125 mg total) by mouth 2 (two) times daily with a meal.   cefTRIAXone  IVPB Commonly known as: ROCEPHIN  Inject 2 g into the vein daily. Indication:  Cervical spine discitis First Dose: Yes Last Day of Therapy:  05/20/2024 Labs - Once weekly:  CBC/D, CMP, CPK, ESR and CRP Fax weekly lab results  promptly to [phone] Method of administration: IV Push Method of administration may be changed at the discretion of home infusion pharmacist based upon assessment of the patient and/or caregiver's ability to self-administer the medication ordered. Radiology to remove central line after completion of antibiotic Call [phone] with any questions or critical value   cholecalciferol  25 MCG (1000 UNIT) tablet Commonly known as: VITAMIN D3 Take 2,000 Units by mouth 2 (two) times daily.   cloNIDine  0.1 MG tablet Commonly known as: Catapres  Take 1 tablet (0.1 mg total) by mouth 2 (two) times daily as needed (pressure >175).   daptomycin  IVPB Commonly known as: CUBICIN  Inject 700 mg into the vein every other day. Indication:   Cervical spine discitis First Dose: Yes Last Day of Therapy:  05/20/2024 Labs - Once weekly:  CBC/D, CMP, CPK, ESR and CRP Fax weekly lab results  promptly to [phone] Method of administration: IV Push Method of administration may be changed at the discretion of home infusion pharmacist based upon assessment of the patient and/or caregiver's ability to self-administer the medication ordered. Radiology to remove central line after completion of antibiotic Call [phone] with any questions or critical value   ezetimibe  10 MG tablet Commonly known as: ZETIA  Take 1 tablet (10 mg total) by mouth daily.   gentamicin  cream 0.1 % Commonly known as: GARAMYCIN  Apply 1 Application topically 3 (three) times daily.   HumaLOG  Mix 75/25 KwikPen (75-25) 100 UNIT/ML KwikPen Generic drug: Insulin  Lispro Prot & Lispro Inject 20 Units into the skin every morning. 75-25 pen injectior   hydrALAZINE  100 MG tablet Commonly known as: APRESOLINE  Take 1 tablet (100 mg total) by mouth 3 (three) times daily. What changed: when to take this   insulin  lispro 100 UNIT/ML KwikPen Commonly known as: HUMALOG  Inject 0-15 Units into the skin 3 (three) times daily.   isosorbide  mononitrate 30 MG 24 hr tablet Commonly known as: IMDUR  Take 1 tablet (30 mg total) by mouth 2 (two) times daily.   nicotine  21 mg/24hr patch Commonly known as: NICODERM CQ  - dosed in mg/24 hours Place 1 patch (21 mg total) onto the skin daily.   nitroGLYCERIN  0.4 MG SL tablet Commonly known as: NITROSTAT  Place 1 tablet (0.4 mg total) under the tongue every 5 (five) minutes as needed for chest pain.  nortriptyline  50 MG capsule Commonly known as: PAMELOR  Take 50 mg by mouth at bedtime.   oxyCODONE  5 MG immediate release tablet Commonly known as: Oxy IR/ROXICODONE  Take 1 tablet (5 mg total) by mouth 2 (two) times daily as needed for severe pain (pain score 7-10). Started by: Kyira Volkert, NP   pantoprazole  40 MG  tablet Commonly known as: PROTONIX  Take 1 tablet (40 mg total) by mouth 2 (two) times daily before a meal.   Repatha  SureClick 140 MG/ML Soaj Generic drug: Evolocumab  INJECT 1 ML UNDER THE SKIN EVERY 14 DAYS   torsemide  20 MG tablet Commonly known as: DEMADEX  Take 1 tablet (20 mg total) by mouth 2 (two) times daily. What changed:  how much to take additional instructions   Toujeo  Max SoloStar 300 UNIT/ML Solostar Pen Generic drug: insulin  glargine (2 Unit Dial ) Inject 60 Units into the skin daily.         Physical Exam: Constitutional: Patient appears well-developed and well-nourished. Not in obvious distress. HENT: Normocephalic, atraumatic, External right and left ear normal. Oropharynx is clear and moist.  Eyes: Conjunctivae and EOM are normal. PERRLA, no scleral icterus. Neck: Normal ROM. Neck supple. No JVD. No tracheal deviation. No thyromegaly. CVS: RRR, S1/S2 +, no murmurs, no gallops, no carotid bruit.  Pulmonary: Effort and breath sounds normal, no stridor, rhonchi, wheezes, rales.  Abdominal: Soft. BS +, no distension, tenderness, rebound or guarding.  Musculoskeletal: Normal range of motion. No edema and no tenderness.  Lymphadenopathy: No lymphadenopathy noted, cervical, inguinal or axillary Neuro: Alert. Normal reflexes, muscle tone coordination. No cranial nerve deficit. Skin: Skin is warm and dry. No rash noted. Not diaphoretic. No erythema. No pallor. Psychiatric: Normal mood and affect. Behavior, judgment, thought content normal.   Data Review   Micro Results Recent Results (from the past 240 hours)  Blood culture (single)     Status: None (Preliminary result)   Collection Time: 05/17/24  8:06 PM   Specimen: BLOOD  Result Value Ref Range Status   Specimen Description BLOOD RIGHT ANTECUBITAL  Final   Special Requests   Final    BOTTLES DRAWN AEROBIC AND ANAEROBIC Blood Culture adequate volume   Culture   Final    NO GROWTH 3 DAYS Performed at  [facility], [address], [place], [place] [address]    Report Status PENDING  Incomplete  Blood culture (routine x 2)     Status: None (Preliminary result)   Collection Time: 05/18/24  1:42 AM   Specimen: BLOOD  Result Value Ref Range Status   Specimen Description BLOOD BLOOD LEFT ARM  Final   Special Requests   Final    BOTTLES DRAWN AEROBIC AND ANAEROBIC Blood Culture adequate volume   Culture   Final    NO GROWTH 3 DAYS Performed at [facility], [address], [place], [place] [address]    Report Status PENDING  Incomplete  Blood culture (routine x 2)     Status: None (Preliminary result)   Collection Time: 05/18/24  3:08 PM   Specimen: Right Antecubital; Blood  Result Value Ref Range Status   Specimen Description RIGHT ANTECUBITAL  Final   Special Requests   Final    BOTTLES DRAWN AEROBIC AND ANAEROBIC Blood Culture results may not be optimal due to an inadequate volume of blood received in culture bottles   Culture   Final    NO GROWTH 3 DAYS Performed at [facility], [address] [place], [place] [address]  Report Status PENDING  Incomplete     CBC Recent Labs  Lab 05/17/24 2006 05/18/24 1508  WBC 11.3* 12.0*  HGB 13.4 11.3*  HCT 39.9 33.8*  PLT 311 268  MCV 94.8 94.7  MCH 31.8 31.7  MCHC 33.6 33.4  RDW 15.3 15.2  LYMPHSABS 1.6  --   MONOABS 1.0  --   EOSABS 0.5  --   BASOSABS 0.3*  --     Chemistries  Recent Labs  Lab 05/17/24 2006 05/18/24 1508 05/19/24 0441  NA 133*  --  136  K 3.5  --  3.5  CL 89*  --  96*  CO2 27  --  24  GLUCOSE 207*  --  150*  BUN 63*  --  69*  CREATININE 6.03* 6.47* 7.14*  CALCIUM  10.1  --  9.0  AST  --   --  18  ALT  --   --  16  ALKPHOS  --   --  133*  BILITOT  --   --  0.3   ------------------------------------------------------------------------------------------------------------------ estimated creatinine clearance is 12 mL/min (A) (by C-G formula based on SCr of 7.14  mg/dL (H)). ------------------------------------------------------------------------------------------------------------------ No results for input(s): HGBA1C in the last 72 hours. ------------------------------------------------------------------------------------------------------------------ No results for input(s): CHOL, HDL, LDLCALC, TRIG, CHOLHDL, LDLDIRECT in the last 72 hours. ------------------------------------------------------------------------------------------------------------------ No results for input(s): TSH, T4TOTAL, T3FREE, THYROIDAB in the last 72 hours.  Invalid input(s): FREET3 ------------------------------------------------------------------------------------------------------------------ No results for input(s): VITAMINB12, FOLATE, FERRITIN, TIBC, IRON, RETICCTPCT in the last 72 hours.  Coagulation profile No results for input(s): INR, PROTIME in the last 168 hours.   No results for input(s): DDIMER in the last 72 hours.  Cardiac Enzymes No results for input(s): CKMB, TROPONINI, MYOGLOBIN in the last 168 hours.  Invalid input(s): CK ------------------------------------------------------------------------------------------------------------------ Invalid input(s): POCBNP  Return for infectious disease specialist notified of wound on left wrist..   Time Spent in minutes  45 Time spent with patient included reviewing progress notes, labs, imaging studies, and discussing plan for follow up.    This patient was seen by Mardy Maxin, FNP-C in collaboration with Dr. Sigrid Bathe as a part of collaborative care agreement.    Mardy Maxin MSN, FNP-C on 04/13/2024 at 2:25 PM   **Disclaimer: This note may have been dictated with voice recognition software. Similar sounding words can inadvertently be transcribed and this note may contain transcription errors which may not have been corrected upon publication of  note.**

## 2024-04-14 ENCOUNTER — Telehealth: Payer: Self-pay

## 2024-04-14 DIAGNOSIS — Z992 Dependence on renal dialysis: Secondary | ICD-10-CM | POA: Diagnosis not present

## 2024-04-14 DIAGNOSIS — N186 End stage renal disease: Secondary | ICD-10-CM | POA: Diagnosis not present

## 2024-04-14 NOTE — Telephone Encounter (Signed)
 [facility] home health nurse katheryn [phone] called that 2 med albuterol  and carvedilol  interact level 2 as per alyssa advised her that its ok take  and also that med given by other provider

## 2024-04-15 DIAGNOSIS — N186 End stage renal disease: Secondary | ICD-10-CM | POA: Diagnosis not present

## 2024-04-15 DIAGNOSIS — M4622 Osteomyelitis of vertebra, cervical region: Secondary | ICD-10-CM | POA: Diagnosis not present

## 2024-04-15 DIAGNOSIS — Z992 Dependence on renal dialysis: Secondary | ICD-10-CM | POA: Diagnosis not present

## 2024-04-16 DIAGNOSIS — Z992 Dependence on renal dialysis: Secondary | ICD-10-CM | POA: Diagnosis not present

## 2024-04-16 DIAGNOSIS — N186 End stage renal disease: Secondary | ICD-10-CM | POA: Diagnosis not present

## 2024-04-17 DIAGNOSIS — Z992 Dependence on renal dialysis: Secondary | ICD-10-CM | POA: Diagnosis not present

## 2024-04-17 DIAGNOSIS — N186 End stage renal disease: Secondary | ICD-10-CM | POA: Diagnosis not present

## 2024-04-18 DIAGNOSIS — N186 End stage renal disease: Secondary | ICD-10-CM | POA: Diagnosis not present

## 2024-04-18 DIAGNOSIS — Z992 Dependence on renal dialysis: Secondary | ICD-10-CM | POA: Diagnosis not present

## 2024-04-19 ENCOUNTER — Telehealth: Payer: Self-pay

## 2024-04-19 DIAGNOSIS — Z992 Dependence on renal dialysis: Secondary | ICD-10-CM | POA: Diagnosis not present

## 2024-04-19 DIAGNOSIS — N186 End stage renal disease: Secondary | ICD-10-CM | POA: Diagnosis not present

## 2024-04-20 ENCOUNTER — Other Ambulatory Visit: Payer: Self-pay | Admitting: Nurse Practitioner

## 2024-04-20 ENCOUNTER — Other Ambulatory Visit: Payer: Self-pay | Admitting: Infectious Diseases

## 2024-04-20 ENCOUNTER — Telehealth (HOSPITAL_BASED_OUTPATIENT_CLINIC_OR_DEPARTMENT_OTHER): Payer: Self-pay

## 2024-04-20 DIAGNOSIS — M4622 Osteomyelitis of vertebra, cervical region: Secondary | ICD-10-CM | POA: Diagnosis not present

## 2024-04-20 DIAGNOSIS — Z992 Dependence on renal dialysis: Secondary | ICD-10-CM | POA: Diagnosis not present

## 2024-04-20 DIAGNOSIS — N186 End stage renal disease: Secondary | ICD-10-CM | POA: Diagnosis not present

## 2024-04-20 MED ORDER — FLUCONAZOLE 200 MG PO TABS: 200.0000 mg | ORAL_TABLET | Freq: Every day | ORAL | 0 refills | Status: DC

## 2024-04-20 MED ORDER — ALPRAZOLAM 0.5 MG PO TABS: 0.5000 mg | ORAL_TABLET | Freq: Two times a day (BID) | ORAL | 0 refills | Status: DC | PRN

## 2024-04-20 NOTE — Telephone Encounter (Signed)
 I called and talked to the pt again. He said he was able to find a ride and will come in tomorrow. Appt made

## 2024-04-20 NOTE — Telephone Encounter (Signed)
 I advised pt of this. He stated his wife is his only ride and she is currently going to the hospital herself right now via ambulance. He said hell would have to freeze over before he comes in tomorrow. I advised pt to go to the ED as well with wife to be evaluated. He said he doesn't know why this is needed when he was just in the hospital for this and saw his infectious disease MD. I told him I would talk to you and call him back. Please advise

## 2024-04-20 NOTE — Progress Notes (Signed)
 Pt has candida albicans cultured from wound left wrist- sending fluconazole  200mg  once a day- Spoke to patient- he is on nortrptiline and QT prolonation is a risk- will get EKG next week when I see him

## 2024-04-20 NOTE — Progress Notes (Signed)
 Patient had called earlier today and was having increased anxiety due to his wife possibly being taken to the ED by ambulance. I called him tonight and his wife was present at home as well. She is ok and took an increased dose of her pain medication which was ok'd by her PCP. He is feeling a little better but states that the anxiety medication is not helping very much at the current dose.   He is ok to increase alprazolam  dose and a new prescription was sent to the pharmacy. He has also received phone calls from Dr. Reno and Dr. Iran offices to make appointments for the open wound on his left wrist. Fluconazole  was also prescribed by Dr. Reno for the yeast cultured from the wrist wound.

## 2024-04-20 NOTE — Telephone Encounter (Signed)
 Per Dr. Gust, [name] [name] needs to be seen in the office tomorrow. I called and talked to the [name] [name]. He said he can't come in tomorrow and I also offered him Monday morning but stated he can only come in between 12-2. Sending message to Dr Gust to advise

## 2024-04-21 ENCOUNTER — Ambulatory Visit

## 2024-04-21 DIAGNOSIS — S60812A Abrasion of left wrist, initial encounter: Secondary | ICD-10-CM | POA: Diagnosis not present

## 2024-04-21 DIAGNOSIS — Z992 Dependence on renal dialysis: Secondary | ICD-10-CM | POA: Diagnosis not present

## 2024-04-21 DIAGNOSIS — N186 End stage renal disease: Secondary | ICD-10-CM | POA: Diagnosis not present

## 2024-04-21 DIAGNOSIS — L089 Local infection of the skin and subcutaneous tissue, unspecified: Secondary | ICD-10-CM

## 2024-04-21 NOTE — Telephone Encounter (Signed)
 Alyssa spoke with pt yesterday

## 2024-04-21 NOTE — Progress Notes (Signed)
 Orthopaedic Surgery History and Physical Visit   History of Present Illness: The patient is a 63 y.o. male, established with the practice, seen in clinic for new complaint, left wrist wound.  Patient presented to [facility] ED on 03/23/2024 with neck pain, associated left upper extremity radiculopathy of numbness and tingling. Patient seen by Maralee Gibbs on 03/25/2024 for continued neck pain and left shoulder pain. Patient referred to neurosurgery for further evaluation of neck pain and given subacromial steroid injection for left shoulder pain. Referral was also placed to home health for physical therapy. Patient seen by [facility] at [facility] on 03/31/2024 for neck pain. A cervical spine MRI was ordered.   Patient then presented to [facility] ED on 04/01/2024 with acute onset of shortness of breath. Patient seen an evaluated by nephrology. Plan was admission; however, patient declined medical admission, so nephrology recommended peritoneal dialysis at home with higher concentration dialysate bags and f/u with nephrology on an outpatient basis.  Cervical spine MRI was performed on 04/06/2024. Edsel Goods PA and Dr. Reeves Daisy reviewed MRI results on 04/08/2024, was concerning for infection/discitis. Advised patient to go to the ER for further evaluation and management.  Patient presented to [facility] ED the afternoon on 04/08/2024. Patient was admitted to the hospital for acute osteomyelitis discitis at C4-C5 level. 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 Patient with recent history of perianal abscess which was drained several weeks ago.  Patient was seen by ID, neurosurgery, and IR. Cervical spine biopsy was not performed, deemed too risky. Patient given PICC line into brachial vein and treated with daptomycin  and Rocephin . Home care set up for home infusion.  Patient also had small wound on volar aspect of left wrist. Xray of left wrist was  negative for bony fracture. Left wrist MRI did not show any evidence of osteomyelitis. Wound was cultured on 11/14 by infectious disease team, with final result available on 11/17.  Patient [facility] up with [facility]) on 04/13/2024.   Patient received call from Dr. Donald Berlin, with ID, on 04/20/2024 regarding results of left wrist wound culture. Revealed candida albicans. Patient prescribed fluconazole  200mg  once a day x 14 days.  I was notified by infectious disease team after these results were available and was asked to see the patient in clinic today.  Patient with ESRD and on peritoneal dialysis. Currently self performs four times per day. Patient also with insulin  dependent diabetes. Last A1c 6.6% on 11/20/2023, five months ago.  Patient reports the wound on his left volar wrist has been present for about 2 months.  He reports it looked like white sunburn initially.  He notes since that time, the appearance has changed to more of a black eschar.  Denies any purulent drainage from the wound.  He does admit to a developing erythematous ring around the eschar site as of late but no streaking erythema in the arm.  He states the wound is painful with palpation.  Denies new numbness or paresthesias in the associated hand.   Past Medical, Social and Family History: Past Medical History:  Diagnosis Date   Anemia    Arthritis    CAD (coronary artery disease)    a. 12/2016 s/p CABG x 4 (LIMA->LAD, VG->RCA, VG->OM1, VG->D1); b. 02/2018 MV: EF 35%, small-med inferolaterlal infarct. No ischemia.   CKD (chronic kidney disease), stage IV [facility])    Peritoneal Dialysis   Colon polyps    COPD (chronic  obstructive pulmonary disease) (HCC)    Dupre's syndrome    GERD (gastroesophageal reflux disease)    HFrEF (heart failure reduced ejection fraction) (HCC)    a.) 2018 EF 35%; b.) 02/2018 EF 50%; c). 02/2019 EF 40-45%; d.) 08/2019 Echo: EF 50-55%, no rwma,  GrII DD; e.) 04/2020 Echo: EF 30-35%, mod-sev glob HK. Mild LVH. G2DD. Low-nl RV fxn. Mildly dil LA. Mild MR. Mild-mod Ao sclerosis w/o stenosis; f.) TTE 02/09/2021: EF 55%; septal HK, LAE; G2DD.   History of 2019 novel coronavirus disease (COVID-19) 02/08/2021   Hyperlipidemia    Hypertension    Ischemic cardiomyopathy    a.) 02/2018 MV: EF 35%; b.) 08/2019 Echo: EF 50-55%; c.) 04/2020 Echo: EF 30-35%; d.) TTE 02/09/2021: EF 50-55%.   Lymphedema    Migraine    Myocardial infarction (HCC)    Neuropathy    PAD (peripheral artery disease)    a. 04/2017 Aortoiliac duplex: R iliac dzs; b. 03/2020 LE Duplex: mild bilat atherosclerosis throughout. Patent vessels.   Pneumonia    Retinopathy    S/P CABG x 4    a.)  4v CABG: LIMA->LAD, SVG->RCA, SVG->OM1, SVG->D1   Sleep apnea    a.) does not require nocturnal PAP therapy   Statin intolerance    Stomach ulcer    T2DM (type 2 diabetes mellitus) (HCC)    Past Surgical History:  Procedure Laterality Date   bone graft surgery Bilateral    on both feet x 2   CAPD INSERTION N/A 11/13/2021   Procedure: LAPAROSCOPIC INSERTION CONTINUOUS AMBULATORY PERITONEAL DIALYSIS  (CAPD) CATHETER;  Surgeon: Jordis Laneta FALCON, MD;  Location: [facility] ORS;  Service: General;  Laterality: N/A;   CAPD REVISION N/A 01/10/2022   Procedure: LAPAROSCOPIC REVISION CONTINUOUS AMBULATORY PERITONEAL DIALYSIS  (CAPD) CATHETER;  Surgeon: Jordis Laneta FALCON, MD;  Location: [facility] ORS;  Service: General;  Laterality: N/A;   CARDIAC CATHETERIZATION  2013   S/p PCI    CARDIAC CATHETERIZATION  2018   S/p CABG   CATARACT EXTRACTION Bilateral    COLONOSCOPY     COLONOSCOPY WITH PROPOFOL  N/A 10/17/2022   Procedure: COLONOSCOPY WITH PROPOFOL ;  Surgeon: Jinny Carmine, MD;  Location: [facility] ENDOSCOPY;  Service: Endoscopy;  Laterality: N/A;   CORONARY ARTERY BYPASS GRAFT  2018   (LIMA-LAD,VG-RCA,VG-OM1,VG-D1)   ESOPHAGOGASTRODUODENOSCOPY (EGD) WITH PROPOFOL  N/A 04/04/2020   Procedure:  ESOPHAGOGASTRODUODENOSCOPY (EGD) WITH PROPOFOL ;  Surgeon: Jinny Carmine, MD;  Location: [facility] ENDOSCOPY;  Service: Endoscopy;  Laterality: N/A;   ESOPHAGOGASTRODUODENOSCOPY (EGD) WITH PROPOFOL  N/A 06/06/2020   Procedure: ESOPHAGOGASTRODUODENOSCOPY (EGD) WITH PROPOFOL ;  Surgeon: Jinny Carmine, MD;  Location: [facility] ENDOSCOPY;  Service: Endoscopy;  Laterality: N/A;   ESOPHAGOGASTRODUODENOSCOPY (EGD) WITH PROPOFOL  N/A 09/24/2022   Procedure: ESOPHAGOGASTRODUODENOSCOPY (EGD) WITH PROPOFOL ;  Surgeon: Jinny Carmine, MD;  Location: [facility] ENDOSCOPY;  Service: Endoscopy;  Laterality: N/A;   PERIPHERAL VASCULAR CATHETERIZATION Right 10/28/2016   PTA/DEB Right SFA   WRIST SURGERY Left    cyst   Allergies  Allergen Reactions   Iodinated Contrast Media Other (See Comments)    Kidney disease  Kidney disease  Kidney disease  Kidney disease   Other Other (See Comments)    Pain  With joint stiffness     Statins Other (See Comments)    Pain  With joint stiffness   Pain  With joint stiffness  Other Reaction(s): Joint Pain   Pregabalin Other (See Comments)    Generalized aches and pains Generalized aches and pains Generalized aches and pains Generalized aches and pains  Generalized aches and pains   Sacubitril-Valsartan Other (See Comments)    Hyperkalemia  Hyperkalemia Hyperkalemia  Hyperkalemia   Atorvastatin Other (See Comments)    Muscle aches Muscle aches   Pravastatin Other (See Comments)    Muscle aches Muscle aches   Rosuvastatin Other (See Comments)    Muscle Aches Other reaction(s): JOINT PAIN Other reaction(s): JOINT PAIN Muscle Aches    Current Outpatient Medications on File Prior to Visit  Medication Sig Dispense Refill   albuterol  (VENTOLIN  HFA) 108 (90 Base) MCG/ACT inhaler Inhale 1-2 puffs into the lungs every 6 (six) hours as needed for wheezing or shortness of breath. 1 each 3   ALPRAZolam  (XANAX ) 0.5 MG tablet Take 1 tablet (0.5 mg total) by mouth 2 (two) times daily as  needed for anxiety. 40 tablet 0   amLODipine  (NORVASC ) 10 MG tablet Take 1 tablet (10 mg total) by mouth daily. 90 tablet 3   aspirin  EC 81 MG tablet Take 81 mg by mouth daily. Swallow whole.     bisacodyl  (DULCOLAX) 5 MG EC tablet Take 10 mg by mouth 2 (two) times daily as needed for moderate constipation.     carvedilol  (COREG ) 3.125 MG tablet Take 1 tablet (3.125 mg total) by mouth 2 (two) times daily with a meal. 180 tablet 3   cefTRIAXone  (ROCEPHIN ) IVPB Inject 2 g into the vein daily. Indication:  Cervical spine discitis First Dose: Yes Last Day of Therapy:  05/20/2024 Labs - Once weekly:  CBC/D, CMP, CPK, ESR and CRP Fax weekly lab results  promptly to [phone] Method of administration: IV Push Method of administration may be changed at the discretion of home infusion pharmacist based upon assessment of the patient and/or caregiver's ability to self-administer the medication ordered. Radiology to remove central line after completion of antibiotic Call [phone] with any questions or critical value 40 Units 0   cholecalciferol  (VITAMIN D3) 25 MCG (1000 UNIT) tablet Take 2,000 Units by mouth 2 (two) times daily.     cloNIDine  (CATAPRES ) 0.1 MG tablet Take 1 tablet (0.1 mg total) by mouth 2 (two) times daily as needed (pressure >175). 60 tablet 3   daptomycin  (CUBICIN ) IVPB Inject 700 mg into the vein every other day. Indication:  Cervical spine discitis First Dose: Yes Last Day of Therapy:  05/20/2024 Labs - Once weekly:  CBC/D, CMP, CPK, ESR and CRP Fax weekly lab results  promptly to [phone] Method of administration: IV Push Method of administration may be changed at the discretion of home infusion pharmacist based upon assessment of the patient and/or caregiver's ability to self-administer the medication ordered. Radiology to remove central line after completion of antibiotic Call [phone] with any questions or critical value 20 Units 0   Evolocumab  (REPATHA   SURECLICK) 140 MG/ML SOAJ INJECT 1 ML UNDER THE SKIN EVERY 14 DAYS 6 mL 3   ezetimibe  (ZETIA ) 10 MG tablet Take 1 tablet (10 mg total) by mouth daily. 90 tablet 3   fluconazole  (DIFLUCAN ) 200 MG tablet Take 1 tablet (200 mg total) by mouth daily. 14 tablet 0   gentamicin  cream (GARAMYCIN ) 0.1 % Apply 1 Application topically 3 (three) times daily.     HUMALOG  MIX 75/25 KWIKPEN (75-25) 100 UNIT/ML KwikPen Inject 20 Units into the skin every morning. 75-25 pen injectior     hydrALAZINE  (APRESOLINE ) 100 MG tablet Take 1 tablet (100 mg total) by mouth 3 (three) times daily. (Patient taking differently: Take 100 mg by mouth 2 (two) times daily.)  270 tablet 3   insulin  lispro (HUMALOG ) 100 UNIT/ML KwikPen Inject 0-15 Units into the skin 3 (three) times daily.     Insulin  Pen Needle (B-D ULTRAFINE III SHORT PEN) 31G X 8 MM MISC To use with pen basal and mealtime coverage insulin  pens 5 times daily. DX: E11.65 500 each 1   isosorbide  mononitrate (IMDUR ) 30 MG 24 hr tablet Take 1 tablet (30 mg total) by mouth 2 (two) times daily. 180 tablet 3   nicotine  (NICODERM CQ  - DOSED IN MG/24 HOURS) 21 mg/24hr patch Place 1 patch (21 mg total) onto the skin daily. 28 patch 0   nitroGLYCERIN  (NITROSTAT ) 0.4 MG SL tablet Place 1 tablet (0.4 mg total) under the tongue every 5 (five) minutes as needed for chest pain. 25 tablet 3   nortriptyline  (PAMELOR ) 50 MG capsule Take 50 mg by mouth at bedtime.     oxyCODONE  (OXY IR/ROXICODONE ) 5 MG immediate release tablet Take 1 tablet (5 mg total) by mouth 2 (two) times daily as needed for severe pain (pain score 7-10). 60 tablet 0   pantoprazole  (PROTONIX ) 40 MG tablet Take 1 tablet (40 mg total) by mouth 2 (two) times daily before a meal. 180 tablet 3   torsemide  (DEMADEX ) 20 MG tablet Take 1 tablet (20 mg total) by mouth 2 (two) times daily. (Patient taking differently: Take 40-60 mg by mouth 2 (two) times daily. 60 mg every morning and 40 mg 12 hours later) 180 tablet 3   TOUJEO   MAX SOLOSTAR 300 UNIT/ML Solostar Pen Inject 60 Units into the skin daily.     No current facility-administered medications on file prior to visit.   Social History   Tobacco Use   Smoking status: Every Day    Current packs/day: 0.25    Average packs/day: 0.3 packs/day for 39.0 years (9.8 ttl pk-yrs)    Types: Cigarettes    Passive exposure: Past   Smokeless tobacco: Never  Vaping Use   Vaping status: Never Used  Substance Use Topics   Alcohol use: Not Currently    Comment: very rarely - wine   Drug use: Never      I have reviewed past medical, surgical, social and family history, medications and allergies as documented in the EMR.  Review of Systems - A ROS was performed including pertinent positives and negatives as documented in the HPI.     Physical Exam:  General/Constitutional: NAD Vascular: No edema, swelling or tenderness, except as noted in detailed exam Integumentary: No impressive skin lesions present, except as noted in detailed exam Neuro/Psych: Normal mood and affect, oriented to person, place and time Musculoskeletal: Normal, except as noted in detailed exam and in HPI   Focused Orthopaedic Examination:  Left wrist/hand examination:  2 cm x 2 cm well-circumscribed eschar noted about the volar ulnar wrist.  Surrounding erythematous ring of tissue.  No purulent fluid expressible from the wound site.  Mild tenderness to palpation about the wound.  Patient also with multiple other scabs noted on several digits.  The scabs do not have the same acute appearance as the volar wrist wound.  Digits slightly cool to touch but perfused.  Patient able to actively flex and extend all digits at MCP and IP joints.  Sensation intact but diminished in the digits of the left hand.              XR left wrist imaging: X-rays of the left wrist including AP, lateral, oblique obtained at [facility] on 04/08/2024  demonstrate no acute fractures or dislocations.  No obvious  destructive bony changes.  Atherosclerotic calcification noted within several vessels of the wrist.  Advanced MRI imaging: MRI images of the left wrist were obtained on 04/08/2024 at [facility] and were available for my review.  Per my interpretation, there are no fractures or dislocations.  No obvious destructive bony changes including osteomyelitis.  Small ganglion cyst noted about the distal radius measured as 1.1 x 0.3 x 0.9 cm from radiology.    Assessment:  Left volar wrist wound Multiple left hand digit wounds  Plan:  Patient was seen and examined in office today. We reviewed patient's history, examination, and imaging in detail. Based on information available for this encounter, patient found to have recent left volar wrist wound with cultures growing Candida albicans.  I was notified by infectious disease team of his wound after recent discharge from the hospital following diagnosis of cervical osteomyelitis.  Patient currently has PICC line in place for his cervical spine infection.  The cultures of his wrist prompted infectious disease team to start antifungal medication for which he plans on beginning today.  Due to the nature of the wounds and resultant positive culture, we believe a debridement procedure is necessary.  We discussed the risks and benefits as well as alternatives of surgery and patient would like to proceed with left volar wrist debridement.  I spoke with infectious disease team and they will continue to follow patient and treat infection accordingly.  We will plan on procedure tentatively for Friday, 11/28.  As this wound may require repeat debridements and possible grafting, I will recommend follow-up with my partner in [place] after the procedure for next steps.  Follow-up appointment has been established for Monday, 12/1.  I discussed all possible treatment options with the patient including conservative measures as well as surgical intervention.  Despite consideration of  conservative care, the patient continues to have pain and worsening wound appearance and therefore the wound was deemed appropriate for operative management.  Given the failure of conservative measures to this point we discussed surgical intervention in the form of left wrist debridement with cultures.   We discussed the risks of surgical intervention including but not limited to continued infection, bleeding, DVT/ PE, damage to surrounding structures, need for further surgery, persistent pain, persistent swelling, complications from anesthesia, stroke, seizure, heart attack, permanent disability, loss of extremity, or even death.  We discussed the benefits of surgical intervention including relief of pain, improvement and/or eradication of infection.    After discussing all possible risk and benefits specific to the patient and the surgery, the patient voiced good understanding of their options moving forward in what each would entail.  The patient then elected to proceed with surgical intervention.  Surgery will be scheduled promptly for Friday, 11/28 due to the nature of the infection.   All questions, concerns and comments were addressed to the best of my ability.  Follow-up: Day of surgery   Arlyss GEANNIE Schneider, DO Orthopedic Surgery & Sports Medicine [place]   This document was dictated using Dragon voice recognition software. A reasonable attempt at proof reading has been made to minimize errors.

## 2024-04-22 DIAGNOSIS — N186 End stage renal disease: Secondary | ICD-10-CM | POA: Diagnosis not present

## 2024-04-22 DIAGNOSIS — Z992 Dependence on renal dialysis: Secondary | ICD-10-CM | POA: Diagnosis not present

## 2024-04-23 ENCOUNTER — Ambulatory Visit: Admission: RE | Admit: 2024-04-23 | Discharge: 2024-04-23 | Disposition: A | Discharge status: Home / Self Care

## 2024-04-23 ENCOUNTER — Encounter: Admission: RE | Disposition: A | Discharge status: Home / Self Care | Payer: Self-pay | Source: Home / Self Care

## 2024-04-23 ENCOUNTER — Ambulatory Visit: Admitting: Certified Registered Nurse Anesthetist

## 2024-04-23 ENCOUNTER — Other Ambulatory Visit: Payer: Self-pay

## 2024-04-23 DIAGNOSIS — Z951 Presence of aortocoronary bypass graft: Secondary | ICD-10-CM | POA: Insufficient documentation

## 2024-04-23 DIAGNOSIS — F17210 Nicotine dependence, cigarettes, uncomplicated: Secondary | ICD-10-CM | POA: Insufficient documentation

## 2024-04-23 DIAGNOSIS — E118 Type 2 diabetes mellitus with unspecified complications: Secondary | ICD-10-CM

## 2024-04-23 DIAGNOSIS — I130 Hypertensive heart and chronic kidney disease with heart failure and stage 1 through stage 4 chronic kidney disease, or unspecified chronic kidney disease: Secondary | ICD-10-CM | POA: Insufficient documentation

## 2024-04-23 DIAGNOSIS — X58XXXA Exposure to other specified factors, initial encounter: Secondary | ICD-10-CM | POA: Insufficient documentation

## 2024-04-23 DIAGNOSIS — I2510 Atherosclerotic heart disease of native coronary artery without angina pectoris: Secondary | ICD-10-CM | POA: Diagnosis not present

## 2024-04-23 DIAGNOSIS — E1122 Type 2 diabetes mellitus with diabetic chronic kidney disease: Secondary | ICD-10-CM | POA: Diagnosis not present

## 2024-04-23 DIAGNOSIS — I509 Heart failure, unspecified: Secondary | ICD-10-CM | POA: Diagnosis not present

## 2024-04-23 DIAGNOSIS — F419 Anxiety disorder, unspecified: Secondary | ICD-10-CM | POA: Diagnosis not present

## 2024-04-23 DIAGNOSIS — E1151 Type 2 diabetes mellitus with diabetic peripheral angiopathy without gangrene: Secondary | ICD-10-CM | POA: Diagnosis not present

## 2024-04-23 DIAGNOSIS — N184 Chronic kidney disease, stage 4 (severe): Secondary | ICD-10-CM | POA: Insufficient documentation

## 2024-04-23 DIAGNOSIS — F32A Depression, unspecified: Secondary | ICD-10-CM | POA: Insufficient documentation

## 2024-04-23 DIAGNOSIS — S61502A Unspecified open wound of left wrist, initial encounter: Secondary | ICD-10-CM | POA: Diagnosis not present

## 2024-04-23 DIAGNOSIS — Z79899 Other long term (current) drug therapy: Secondary | ICD-10-CM | POA: Insufficient documentation

## 2024-04-23 DIAGNOSIS — I5020 Unspecified systolic (congestive) heart failure: Secondary | ICD-10-CM | POA: Diagnosis not present

## 2024-04-23 DIAGNOSIS — N186 End stage renal disease: Secondary | ICD-10-CM | POA: Diagnosis not present

## 2024-04-23 DIAGNOSIS — I132 Hypertensive heart and chronic kidney disease with heart failure and with stage 5 chronic kidney disease, or end stage renal disease: Secondary | ICD-10-CM | POA: Diagnosis not present

## 2024-04-23 DIAGNOSIS — Z992 Dependence on renal dialysis: Secondary | ICD-10-CM | POA: Diagnosis not present

## 2024-04-23 HISTORY — PX: INCISION AND DRAINAGE OF WOUND: SHX1803

## 2024-04-23 LAB — POCT I-STAT, CHEM 8
BUN: 40 mg/dL — ABNORMAL HIGH (ref 8–23)
BUN: 40 mg/dL — ABNORMAL HIGH (ref 8–23)
Calcium, Ion: 1.04 mmol/L — ABNORMAL LOW (ref 1.15–1.40)
Calcium, Ion: 1.04 mmol/L — ABNORMAL LOW (ref 1.15–1.40)
Chloride: 98 mmol/L (ref 98–111)
Chloride: 98 mmol/L (ref 98–111)
Creatinine, Ser: 5.4 mg/dL — ABNORMAL HIGH (ref 0.61–1.24)
Creatinine, Ser: 6.4 mg/dL — ABNORMAL HIGH (ref 0.61–1.24)
Glucose, Bld: 145 mg/dL — ABNORMAL HIGH (ref 70–99)
Glucose, Bld: 132 mg/dL — ABNORMAL HIGH (ref 70–99)
HCT: 37 % — ABNORMAL LOW (ref 39.0–52.0)
HCT: 35 % — ABNORMAL LOW (ref 39.0–52.0)
Hemoglobin: 12.6 g/dL — ABNORMAL LOW (ref 13.0–17.0)
Hemoglobin: 11.9 g/dL — ABNORMAL LOW (ref 13.0–17.0)
Potassium: 2.9 mmol/L — ABNORMAL LOW (ref 3.5–5.1)
Potassium: 3 mmol/L — ABNORMAL LOW (ref 3.5–5.1)
Sodium: 137 mmol/L (ref 135–145)
Sodium: 137 mmol/L (ref 135–145)
TCO2: 25 mmol/L (ref 22–32)
TCO2: 23 mmol/L (ref 22–32)

## 2024-04-23 LAB — BASIC METABOLIC PANEL WITH GFR
Anion gap: 18 — ABNORMAL HIGH (ref 5–15)
BUN: 44 mg/dL — ABNORMAL HIGH (ref 8–23)
CO2: 25 mmol/L (ref 22–32)
Calcium: 9 mg/dL (ref 8.9–10.3)
Chloride: 95 mmol/L — ABNORMAL LOW (ref 98–111)
Creatinine, Ser: 6.18 mg/dL — ABNORMAL HIGH (ref 0.61–1.24)
GFR, Estimated: 9 mL/min — ABNORMAL LOW (ref >60)
Glucose, Bld: 149 mg/dL — ABNORMAL HIGH (ref 70–99)
Potassium: 2.9 mmol/L — ABNORMAL LOW (ref 3.5–5.1)
Sodium: 138 mmol/L (ref 135–145)

## 2024-04-23 LAB — GLUCOSE, CAPILLARY
Glucose-Capillary: 123 mg/dL — ABNORMAL HIGH (ref 70–99)
Glucose-Capillary: 123 mg/dL — ABNORMAL HIGH (ref 70–99)

## 2024-04-23 SURGERY — IRRIGATION AND DEBRIDEMENT WOUND
Anesthesia: General | Site: Wrist | Laterality: Left

## 2024-04-23 MED ORDER — PROPOFOL 10 MG/ML IV BOLUS
INTRAVENOUS | Status: AC
  Filled 2024-04-23: qty 20

## 2024-04-23 MED ORDER — CHLORHEXIDINE GLUCONATE 0.12 % MT SOLN
15.0000 mL | Freq: Once | OROMUCOSAL | Status: AC
  Administered 2024-04-23: 15 mL via OROMUCOSAL

## 2024-04-23 MED ORDER — SODIUM CHLORIDE 0.9 % IV SOLN: INTRAVENOUS | Status: DC

## 2024-04-23 MED ORDER — MIDAZOLAM HCL 2 MG/2ML IJ SOLN
INTRAMUSCULAR | Status: AC
  Filled 2024-04-23: qty 2

## 2024-04-23 MED ORDER — PROPOFOL 10 MG/ML IV BOLUS
INTRAVENOUS | Status: DC | PRN
Start: 2024-04-23 — End: 2024-04-23
  Administered 2024-04-23: 100 mg via INTRAVENOUS

## 2024-04-23 MED ORDER — ORAL CARE MOUTH RINSE: 15.0000 mL | Freq: Once | OROMUCOSAL | Status: AC

## 2024-04-23 MED ORDER — LIDOCAINE HCL (PF) 2 % IJ SOLN
INTRAMUSCULAR | Status: AC
  Filled 2024-04-23: qty 5

## 2024-04-23 MED ORDER — FENTANYL CITRATE (PF) 100 MCG/2ML IJ SOLN: 25.0000 ug | INTRAMUSCULAR | Status: DC | PRN

## 2024-04-23 MED ORDER — FENTANYL CITRATE (PF) 100 MCG/2ML IJ SOLN
INTRAMUSCULAR | Status: AC
  Filled 2024-04-23: qty 2

## 2024-04-23 MED ORDER — OXYCODONE HCL 5 MG/5ML PO SOLN: 5.0000 mg | Freq: Once | ORAL | Status: DC | PRN

## 2024-04-23 MED ORDER — DEXAMETHASONE SOD PHOSPHATE PF 10 MG/ML IJ SOLN
INTRAMUSCULAR | Status: DC | PRN
  Administered 2024-04-23: 5 mg via INTRAVENOUS

## 2024-04-23 MED ORDER — BUPIVACAINE HCL (PF) 0.5 % IJ SOLN
INTRAMUSCULAR | Status: AC
Start: 2024-04-23 — End: 2024-04-23
  Filled 2024-04-23: qty 30

## 2024-04-23 MED ORDER — LIDOCAINE HCL 1 % IJ SOLN
INTRAMUSCULAR | Status: DC | PRN
  Administered 2024-04-23: 6 mL

## 2024-04-23 MED ORDER — POTASSIUM CHLORIDE 10 MEQ/100ML IV SOLN
10.0000 meq | INTRAVENOUS | Status: AC
Start: 2024-04-23 — End: 2024-04-23
  Administered 2024-04-23 (×2): 10 meq via INTRAVENOUS
  Filled 2024-04-23 (×3): qty 100

## 2024-04-23 MED ORDER — FENTANYL CITRATE (PF) 100 MCG/2ML IJ SOLN
INTRAMUSCULAR | Status: DC | PRN
  Administered 2024-04-23 (×2): 25 ug via INTRAVENOUS

## 2024-04-23 MED ORDER — MIDAZOLAM HCL (PF) 2 MG/2ML IJ SOLN
INTRAMUSCULAR | Status: DC | PRN
  Administered 2024-04-23: 2 mg via INTRAVENOUS

## 2024-04-23 MED ORDER — EPHEDRINE SULFATE-NACL 50-0.9 MG/10ML-% IV SOSY
PREFILLED_SYRINGE | INTRAVENOUS | Status: DC | PRN
  Administered 2024-04-23 (×2): 10 mg via INTRAVENOUS
  Administered 2024-04-23: 5 mg via INTRAVENOUS

## 2024-04-23 MED ORDER — CHLORHEXIDINE GLUCONATE 0.12 % MT SOLN
OROMUCOSAL | Status: AC
  Filled 2024-04-23: qty 15

## 2024-04-23 MED ORDER — BUPIVACAINE HCL (PF) 0.5 % IJ SOLN
INTRAMUSCULAR | Status: AC
  Filled 2024-04-23: qty 30

## 2024-04-23 MED ORDER — EPHEDRINE 5 MG/ML INJ
INTRAVENOUS | Status: AC
  Filled 2024-04-23: qty 5

## 2024-04-23 MED ORDER — ONDANSETRON HCL 4 MG/2ML IJ SOLN
INTRAMUSCULAR | Status: AC
  Filled 2024-04-23: qty 2

## 2024-04-23 MED ORDER — LIDOCAINE HCL (CARDIAC) PF 100 MG/5ML IV SOSY
PREFILLED_SYRINGE | INTRAVENOUS | Status: DC | PRN
  Administered 2024-04-23: 80 mg via INTRAVENOUS

## 2024-04-23 MED ORDER — 0.9 % SODIUM CHLORIDE (POUR BTL) OPTIME
TOPICAL | Status: DC | PRN
  Administered 2024-04-23: 500 mL

## 2024-04-23 MED ORDER — OXYCODONE HCL 5 MG PO TABS: 5.0000 mg | ORAL_TABLET | Freq: Once | ORAL | Status: DC | PRN

## 2024-04-23 MED ORDER — LIDOCAINE HCL (PF) 1 % IJ SOLN
INTRAMUSCULAR | Status: AC
Start: 2024-04-23 — End: 2024-04-23
  Filled 2024-04-23: qty 30

## 2024-04-23 MED ORDER — ONDANSETRON HCL 4 MG/2ML IJ SOLN
INTRAMUSCULAR | Status: DC | PRN
  Administered 2024-04-23: 4 mg via INTRAVENOUS

## 2024-04-23 SURGICAL SUPPLY — 31 items
BLADE SURG SZ10 CARB STEEL (BLADE) ×1 IMPLANT
BNDG COHESIVE 4X5 TAN STRL LF (GAUZE/BANDAGES/DRESSINGS) IMPLANT
BNDG ELASTIC 4INX 5YD STR LF (GAUZE/BANDAGES/DRESSINGS) ×1 IMPLANT
BNDG ESMARCH 4X12 STRL LF (GAUZE/BANDAGES/DRESSINGS) ×1 IMPLANT
CHLORAPREP W/TINT 26 (MISCELLANEOUS) ×1 IMPLANT
CNTNR URN SCR LID CUP LEK RST (MISCELLANEOUS) ×1 IMPLANT
CUFF TOURN SGL QUICK 18X4 (TOURNIQUET CUFF) IMPLANT
CUFF TRNQT CYL 24X4X16.5-23 (TOURNIQUET CUFF) IMPLANT
ELECTRODE REM PT RTRN 9FT ADLT (ELECTROSURGICAL) ×1 IMPLANT
GLOVE BIOGEL PI IND STRL 7.5 (GLOVE) ×1 IMPLANT
GLOVE SURG SYN 7.5 PF PI (GLOVE) ×1 IMPLANT
GOWN STRL REUS W/ TWL LRG LVL3 (GOWN DISPOSABLE) ×1 IMPLANT
KIT TURNOVER KIT A (KITS) ×1 IMPLANT
LABEL OR SOLS (LABEL) ×1 IMPLANT
MANIFOLD NEPTUNE II (INSTRUMENTS) ×1 IMPLANT
NDL SAFETY ECLIP 18X1.5 (MISCELLANEOUS) ×1 IMPLANT
PACK EXTREMITY ARMC (MISCELLANEOUS) ×1 IMPLANT
PAD CAST 4YDX4 CTTN HI CHSV (CAST SUPPLIES) IMPLANT
PADDING CAST BLEND 4X4 STRL (MISCELLANEOUS) IMPLANT
PENCIL SMOKE EVACUATOR (MISCELLANEOUS) ×1 IMPLANT
SOLN 0.9% NACL POUR BTL 1000ML (IV SOLUTION) ×1 IMPLANT
SOLN STERILE WATER 500 ML (IV SOLUTION) ×1 IMPLANT
SPLINT CAST 1 STEP 3X12 (MISCELLANEOUS) ×1 IMPLANT
SPONGE T-LAP 18X18 ~~LOC~~+RFID (SPONGE) ×1 IMPLANT
STAPLER SKIN PROX 35W (STAPLE) ×1 IMPLANT
STOCKINETTE BIAS CUT 4 980044 (GAUZE/BANDAGES/DRESSINGS) ×1 IMPLANT
STOCKINETTE IMPERVIOUS 9X36 MD (GAUZE/BANDAGES/DRESSINGS) ×1 IMPLANT
SUT PROLENE 4 0 PS 2 18 (SUTURE) IMPLANT
SWAB CULTURE AMIES ANAERIB BLU (MISCELLANEOUS) ×1 IMPLANT
SYR 10ML LL (SYRINGE) ×1 IMPLANT
TRAP FLUID SMOKE EVACUATOR (MISCELLANEOUS) ×1 IMPLANT

## 2024-04-23 NOTE — H&P (Addendum)
 [facility] H&P Update  The history and physical was reviewed, the patient was re-examined, and any necessary changes have been documented. See periop note for full details.  The risks, benefits, and alternatives have been discussed at length and the patient wishes to proceed. Patient has wedding ring on operative extremity today. We advised that this ring be removed for procedure. Patient refuses to remove ring and is willing to accept any risks associated with complications that may arise from wearing the ring including amputation of digit. Patient acknowledges and wishes to proceed.  Plan for left wrist wound debridement today     Arlyss GEANNIE Schneider, DO Orthopedic Surgery & Sports Medicine [facility]

## 2024-04-23 NOTE — Op Note (Addendum)
 [facility] Department of Orthopaedic Surgery Operative Report  Patient:  [name] [name] MRN:  [id number] Date of birth:  08/07/1960    Date of surgery:  04/23/2024  Location:  [facility]- [address] [place], [place], [place] [address]  Pre-Operative Diagnosis: Left wrist fungal wound  (3 cm x 2 cm x 0.5 cm)  Post-Operative Diagnosis: same   Procedure: Left wrist wound irrigation and debridement  Surgeon:  Arlyss GEANNIE Schneider, DO   First Assistant: Sheppard Stabs, PA  Anesthesiologist: Anesthesiologist: Dario Barter, MD CRNA: Lacretia Camelia NOVAK, CRNA    Anesthesia type: General  EBL: 2 cc  Specimens: Left wrist superficial eschar, left wrist wound bed  Complications: None  Implants: None  Operative Indications: This patient was recently found to have a fungal infection of his left wrist based on culture swab from infectious disease team.  Orthopedic surgery was notified of this result and patient was seen in office.  We had previously discussed the options, alternatives, potential risks, and expected benefits. Specifically the risk of persistence or recurrence of infection, injury to adjacent structures, wound healing complications, need for further surgery were discussed. We also discussed expected outcomes from various appropriate alternatives, including continued conservative treatment.  The patient asked appropriate questions, and all questions were answered.  The patient wished to proceed with operative treatment.   Procedural details: The patient was identified in the holding area; the history and physical examination was updated and the surgical site was marked.  All questions were answered.  The patient was similarly identified by the anesthesia team.  The patient was transported the operating room where an additional check-in verification procedure was performed with the team.  Antibiotics were not administered as patient was already receiving antibiotics per  infectious disease.  Following the OR check-in, the patient was anesthetized.  The patient was positioned supine on the operating table with appropriate padding and secured.  The surgical extremity was prepped and draped in the usual sterile fashion with multiple layers of antiseptic solution and appropriate occlusive disposable drapes.  A time-out was then performed confirming the patient's surgical site, planned procedure, planned equipment.   We began by bluntly elevating the 3 cm x 2 cm x 0.5 cm eschar over the volar ulnar left wrist.  This was carefully undermined utilizing a Therapist, nutritional.  Once the eschar was successfully removed, this was sent off as a superficial wound culture. The underlying wound bed did not have any purulent appearance to it.  Very minimal bleeding was noted about the periphery of the wound bed. The depth of the wound bed was approximately 0.5 cm in depth. We then proceeded to gently curette the wound bed and send a deep culture for analysis.  The wound bed was then gently irrigated with normal saline.  A sterile dressing of Xeroform, wet-to-dry 4 x 4 gauze, Webril, and Ace was placed. Patient tolerated the procedure well was taken to the PACU in stable condition.  Instrument, sponge, and needle counts were correct prior to wound closure and at the conclusion of the case.   Condition on discharge:  The patient was discharged home in satisfactory condition.  Postoperative instructions:  The patient was instructed to take Tylenol  as needed for pain control.  The patient was instructed to keep the operative hand elevated and keep the fingers moving to allow full range of motion.  Patient and wife were educated on daily wet-to-dry dressing changes.  Patient will follow-up with my partner in [place] next  week for definitive management of his left wrist wound.  We will await culture results and infectious disease team recommendations.  He will continue antifungal medication per  their recommendations pending results of surgical cultures.   Arlyss Schneider, DO [facility] Orthopedic Surgery

## 2024-04-23 NOTE — Discharge Instructions (Signed)
 [facility]  Discharge Instructions  [name] GEANNIE Schneider, DO [address], [place], [place] [address] Phone: [phone]     ACTIVITY:   You are allowed to and encouraged to move your fingers, wrist, elbow and/or shoulder as allowed by the bandage.  This will not only decrease swelling but also decrease some of the pain that will occur when your joints get stiff.   Elevating the arm is strongly encouraged to decrease pain and swelling.  Keeping the surgical site above the level of your heart is high enough.  You may also want to prop your arm up on pillows or the Styrofoam pillow that you receive in the operating room.  Apply ice to the operative area as needed for swelling.  Place ice in a bag or wash cloth to keep the cast/ bandages dry (20 minutes on/ 20 minutes off).     DRESSING CARE:  Keep the dressing clean and dry. Please perform daily dressing changes with xeroform, wet-to-dry gauze, kerlix and ace bandage. Avoid putting direct pressure onto the wound.    DIET:   Resume your usual diet as tolerated    EXERCISES:   Move your fingers and thumb which are free of the bandage often (5 minutes, 6 times a day) so they do not become stiff.     MEDICATIONS:   Resume your routine medications. Small procedures typically don't require narcotic medications that can have side effects and can lead to dependence.    RETURN TO WORK:   Depending on what your job entails, how flexible your employer is, and how quickly you heal, will alter when you return to work.  Some are allowed to go back as early as a few days.       NOTIFY YOUR DOCTOR IF YOU EXPERIENCE ANY OF THE FOLLOWING:     Call the office with concerns during the daytime hours.  If overnight problems arise, please go to the Emergency Room  fever of 101 or above  redness, warmth in the hand or arm that doesn't' go away with icing and elevation foul smelling drainage from  bandage/cast pain that does not decrease with pain medication and adjunct meds persistent nausea or vomiting into the next day after surgery bleeding or continuous oozing that saturates the bandage that does not stop after applying pressure to wound for 10 minutes.  Increased swelling of fingers or severe tightness of bandage not relieved with elevation of limb above the level of the heart.  Increased numbness and tingling in the hand/fingers  Pale, blue or cold fingers, especially the nail beds when comparing to the other side.    Common findings:   Bruising into the hand/ fingers and sometimes even into the arm is normal Swelling is going to be present for at least the next 2-3 weeks, and may look different than the other hand for a prolonged period of time.     FOLLOW-UP CARE: You should already have a postoperative appointment set up with Dr. Harden on 12/1 at 1:15 PM. If you have any questions about your appointment please call the office at [phone].      In case of an emergency in which you cannot reach your physician, please go directly to the nearest hospital emergency room department.

## 2024-04-23 NOTE — Anesthesia Procedure Notes (Signed)
 Procedure Name: LMA Insertion Date/Time: 04/23/2024 3:54 PM  Performed by: Lacretia Camelia NOVAK, CRNAPre-anesthesia Checklist: Patient identified, Emergency Drugs available, Suction available and Patient being monitored Patient Re-evaluated:Patient Re-evaluated prior to induction Oxygen Delivery Method: Circle system utilized Preoxygenation: Pre-oxygenation with 100% oxygen Induction Type: IV induction LMA: LMA inserted LMA Size: 4.0 Number of attempts: 1 Placement Confirmation: positive ETCO2 Tube secured with: Tape Dental Injury: Teeth and Oropharynx as per pre-operative assessment

## 2024-04-23 NOTE — Anesthesia Preprocedure Evaluation (Addendum)
 Anesthesia Evaluation  Patient identified by MRN, date of birth, ID band Patient awake    Reviewed: Allergy & Precautions, NPO status , Patient's Chart, lab work & pertinent test results  History of Anesthesia Complications Negative for: history of anesthetic complications  Airway Mallampati: III  TM Distance: >3 FB Neck ROM: full    Dental  (+) Chipped, Dental Advidsory Given   Pulmonary shortness of breath and with exertion, sleep apnea , COPD, neg recent URI, Current Smoker   Pulmonary exam normal        Cardiovascular hypertension, On Medications (-) angina + CAD, + Past MI, + CABG, + Peripheral Vascular Disease and +CHF  (-) Cardiac Stents Normal cardiovascular exam(-) dysrhythmias (-) Valvular Problems/Murmurs  Echo 2024  IMPRESSIONS     1. Left ventricular ejection fraction, by estimation, is 35 to 40%. The  left ventricle has moderately decreased function. There is mild left  ventricular hypertrophy. Left ventricular diastolic parameters are  consistent with Grade III diastolic  dysfunction (restrictive). Elevated left atrial pressure. There is mild  global hypokinesis with severe hypokinesis of the left ventricular,  mid-apical inferior wall and inferolateral wall.   2. Right ventricular systolic function is mildly reduced. The right  ventricular size is normal. There is normal pulmonary artery systolic  pressure.   3. Left atrial size was mildly dilated.   4. The mitral valve is degenerative. Moderate to severe mitral valve  regurgitation.   5. The aortic valve is tricuspid. Aortic valve regurgitation is trivial.  Aortic valve sclerosis is present, with no evidence of aortic valve  stenosis.   6. The inferior vena cava is dilated in size with >50% respiratory  variability, suggesting right atrial pressure of 8 mmHg.     Neuro/Psych  Headaches, neg Seizures PSYCHIATRIC DISORDERS Anxiety Depression      Neuromuscular disease    GI/Hepatic Neg liver ROS, PUD,GERD  ,,  Endo/Other  diabetes    Renal/GU Dialysis and ESRFRenal disease     Musculoskeletal   Abdominal   Peds  Hematology  (+) Blood dyscrasia, anemia   Anesthesia Other Findings Past Medical History: No date: Anemia No date: Arthritis No date: CAD (coronary artery disease)     Comment:  a. 12/2016 s/p CABG x 4 (LIMA->LAD, VG->RCA, VG->OM1,               VG->D1); b. 02/2018 MV: EF 35%, small-med inferolaterlal               infarct. No ischemia. 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 02/2019 EF               40-45%; d.) 08/2019 Echo: EF 50-55%, no rwma, GrII DD; e.)              04/2020 Echo: EF 30-35%, mod-sev glob HK. Mild LVH. G2DD.              Low-nl RV fxn. Mildly dil LA. Mild MR. Mild-mod Ao               sclerosis w/o stenosis; f.) TTE 02/09/2021: EF 55%;  septal HK, LAE; G2DD. 02/08/2021: History of 2019 novel coronavirus disease (COVID-19) No date: Hyperlipidemia No date: Hypertension No date: Ischemic cardiomyopathy     Comment:  a.) 02/2018 MV: EF 35%; b.) 08/2019 Echo: EF 50-55%; c.)               04/2020 Echo: EF 30-35%; d.) TTE 02/09/2021: EF 50-55%. No date: Lymphedema No date: Migraine No date: Myocardial infarction (HCC) No date: Neuropathy No date: PAD (peripheral artery disease)     Comment:  a. 04/2017 Aortoiliac duplex: R iliac dzs; b. 03/2020 LE              Duplex: mild bilat atherosclerosis throughout. Patent               vessels. No date: Pneumonia No date: Retinopathy No date: S/P CABG x 4     Comment:  a.)  4v CABG: LIMA->LAD, SVG->RCA, SVG->OM1,  SVG->D1 No date: Sleep apnea     Comment:  a.) does not require nocturnal PAP therapy No date: Statin intolerance No date: Stomach ulcer No date: T2DM (type 2 diabetes mellitus) (HCC)  Past Surgical History: No date: bone graft surgery; Bilateral     Comment:  on both feet x 2 11/13/2021: CAPD INSERTION; N/A     Comment:  Procedure: LAPAROSCOPIC INSERTION CONTINUOUS AMBULATORY               PERITONEAL DIALYSIS  (CAPD) CATHETER;  Surgeon: Jordis Laneta FALCON, MD;  Location: [facility] ORS;  Service: General;                Laterality: N/A; 01/10/2022: CAPD REVISION; N/A     Comment:  Procedure: LAPAROSCOPIC REVISION CONTINUOUS AMBULATORY               PERITONEAL DIALYSIS  (CAPD) CATHETER;  Surgeon: Jordis Laneta FALCON, MD;  Location: [facility] ORS;  Service: General;                Laterality: N/A; 2013: CARDIAC CATHETERIZATION     Comment:  S/p PCI  2018: CARDIAC CATHETERIZATION     Comment:  S/p CABG No date: CATARACT EXTRACTION; Bilateral No date: COLONOSCOPY 10/17/2022: COLONOSCOPY WITH PROPOFOL ; N/A     Comment:  Procedure: COLONOSCOPY WITH PROPOFOL ;  Surgeon: Jinny Carmine, MD;  Location: [facility] ENDOSCOPY;  Service:               Endoscopy;  Laterality: N/A; 2018: CORONARY ARTERY BYPASS GRAFT     Comment:  (LIMA-LAD,VG-RCA,VG-OM1,VG-D1) 04/04/2020: ESOPHAGOGASTRODUODENOSCOPY (EGD) WITH PROPOFOL ; N/A     Comment:  Procedure: ESOPHAGOGASTRODUODENOSCOPY (EGD) WITH               PROPOFOL ;  Surgeon: Jinny Carmine, MD;  Location: [facility]               ENDOSCOPY;  Service: Endoscopy;  Laterality: N/A; 06/06/2020: ESOPHAGOGASTRODUODENOSCOPY (EGD) WITH PROPOFOL ; N/A     Comment:  Procedure: ESOPHAGOGASTRODUODENOSCOPY (EGD) WITH               PROPOFOL ;  Surgeon: Jinny Carmine, MD;  Location: [facility]               ENDOSCOPY;  Service: Endoscopy;  Laterality: N/A; 09/24/2022: ESOPHAGOGASTRODUODENOSCOPY (EGD) WITH PROPOFOL ; N/A  Comment:  Procedure:  ESOPHAGOGASTRODUODENOSCOPY (EGD) WITH               PROPOFOL ;  Surgeon: Jinny Carmine, MD;  Location: [facility]               ENDOSCOPY;  Service: Endoscopy;  Laterality: N/A; 10/28/2016: PERIPHERAL VASCULAR CATHETERIZATION; Right     Comment:  PTA/DEB Right SFA No date: WRIST SURGERY; Left     Comment:  cyst     Reproductive/Obstetrics negative OB ROS                              Anesthesia Physical Anesthesia Plan  ASA: 4  Anesthesia Plan: General   Post-op Pain Management: Ofirmev  IV (intra-op)*   Induction: Intravenous  PONV Risk Score and Plan: 2 and Treatment may vary due to age or medical condition, Ondansetron  and Dexamethasone   Airway Management Planned: LMA  Additional Equipment:   Intra-op Plan:   Post-operative Plan: Extubation in OR  Informed Consent: I have reviewed the patients History and Physical, chart, labs and discussed the procedure including the risks, benefits and alternatives for the proposed anesthesia with the patient or authorized representative who has indicated his/her understanding and acceptance.     Dental Advisory Given  Plan Discussed with: Anesthesiologist, CRNA and Surgeon  Anesthesia Plan Comments: (Patient consented for risks of anesthesia including but not limited to:  - adverse reactions to medications - damage to eyes, teeth, lips or other oral mucosa - nerve damage due to positioning  - sore throat or hoarseness - Damage to heart, brain, nerves, lungs, other parts of body or loss of life  Patient voiced understanding and assent.)         Anesthesia Quick Evaluation

## 2024-04-23 NOTE — OR Nursing (Signed)
 Patient refused to remove wedding ring from left ring finger. Informed patient that we would have to prep over ring and tape. Patient agreed. Surgeon aware.

## 2024-04-23 NOTE — Transfer of Care (Signed)
 Immediate Anesthesia Transfer of Care Note  Patient: Culley Hedeen  Procedure(s) Performed: IRRIGATION AND DEBRIDEMENT WOUND (Left: Wrist)  Patient Location: PACU  Anesthesia Type:General  Level of Consciousness: drowsy and patient cooperative  Airway & Oxygen Therapy: Patient Spontanous Breathing and Patient connected to face mask oxygen  Post-op Assessment: Report given to RN and Post -op Vital signs reviewed and stable  Post vital signs: Reviewed and stable  Last Vitals:  Vitals Value Taken Time  BP 110/61 04/23/24 16:30  Temp    Pulse 70 04/23/24 16:31  Resp 13 04/23/24 16:31  SpO2 100 % 04/23/24 16:31  Vitals shown include unfiled device data.  Last Pain:  Vitals:   04/23/24 1103  TempSrc: Temporal  PainSc: 3          Complications: No notable events documented.

## 2024-04-24 DIAGNOSIS — Z992 Dependence on renal dialysis: Secondary | ICD-10-CM | POA: Diagnosis not present

## 2024-04-24 DIAGNOSIS — N186 End stage renal disease: Secondary | ICD-10-CM | POA: Diagnosis not present

## 2024-04-25 DIAGNOSIS — Z992 Dependence on renal dialysis: Secondary | ICD-10-CM | POA: Diagnosis not present

## 2024-04-25 DIAGNOSIS — M4622 Osteomyelitis of vertebra, cervical region: Secondary | ICD-10-CM | POA: Diagnosis not present

## 2024-04-25 DIAGNOSIS — N186 End stage renal disease: Secondary | ICD-10-CM | POA: Diagnosis not present

## 2024-04-25 LAB — ACID FAST SMEAR (AFB, MYCOBACTERIA)
Acid Fast Smear: NEGATIVE
Acid Fast Smear: NEGATIVE

## 2024-04-26 ENCOUNTER — Ambulatory Visit: Admitting: Orthopedic Surgery

## 2024-04-26 DIAGNOSIS — S60812A Abrasion of left wrist, initial encounter: Secondary | ICD-10-CM | POA: Diagnosis not present

## 2024-04-26 DIAGNOSIS — L089 Local infection of the skin and subcutaneous tissue, unspecified: Secondary | ICD-10-CM | POA: Diagnosis not present

## 2024-04-27 ENCOUNTER — Encounter (INDEPENDENT_AMBULATORY_CARE_PROVIDER_SITE_OTHER): Payer: Self-pay | Admitting: Vascular Surgery

## 2024-04-27 ENCOUNTER — Ambulatory Visit
Admission: RE | Admit: 2024-04-27 | Discharge: 2024-04-27 | Disposition: A | Discharge status: Home / Self Care | Source: Ambulatory Visit | Attending: Infectious Diseases | Admitting: Infectious Diseases

## 2024-04-27 ENCOUNTER — Encounter: Payer: Self-pay | Admitting: Orthopedic Surgery

## 2024-04-27 ENCOUNTER — Other Ambulatory Visit (INDEPENDENT_AMBULATORY_CARE_PROVIDER_SITE_OTHER)

## 2024-04-27 ENCOUNTER — Ambulatory Visit: Admitting: Infectious Diseases

## 2024-04-27 ENCOUNTER — Telehealth: Payer: Self-pay | Admitting: Nurse Practitioner

## 2024-04-27 ENCOUNTER — Ambulatory Visit: Attending: Infectious Diseases | Admitting: Infectious Diseases

## 2024-04-27 ENCOUNTER — Ambulatory Visit (INDEPENDENT_AMBULATORY_CARE_PROVIDER_SITE_OTHER): Admitting: Vascular Surgery

## 2024-04-27 VITALS — BP 150/91 | HR 94 | Temp 97.3°F

## 2024-04-27 VITALS — BP 138/73 | HR 92 | Resp 17 | Ht 73.0 in | Wt 185.4 lb

## 2024-04-27 DIAGNOSIS — F17210 Nicotine dependence, cigarettes, uncomplicated: Secondary | ICD-10-CM | POA: Diagnosis not present

## 2024-04-27 DIAGNOSIS — Z8249 Family history of ischemic heart disease and other diseases of the circulatory system: Secondary | ICD-10-CM | POA: Diagnosis not present

## 2024-04-27 DIAGNOSIS — E1122 Type 2 diabetes mellitus with diabetic chronic kidney disease: Secondary | ICD-10-CM | POA: Diagnosis not present

## 2024-04-27 DIAGNOSIS — Z992 Dependence on renal dialysis: Secondary | ICD-10-CM | POA: Insufficient documentation

## 2024-04-27 DIAGNOSIS — N186 End stage renal disease: Secondary | ICD-10-CM | POA: Diagnosis not present

## 2024-04-27 DIAGNOSIS — I132 Hypertensive heart and chronic kidney disease with heart failure and with stage 5 chronic kidney disease, or end stage renal disease: Secondary | ICD-10-CM | POA: Insufficient documentation

## 2024-04-27 DIAGNOSIS — M4642 Discitis, unspecified, cervical region: Secondary | ICD-10-CM | POA: Insufficient documentation

## 2024-04-27 DIAGNOSIS — T50905A Adverse effect of unspecified drugs, medicaments and biological substances, initial encounter: Secondary | ICD-10-CM | POA: Diagnosis not present

## 2024-04-27 DIAGNOSIS — E1151 Type 2 diabetes mellitus with diabetic peripheral angiopathy without gangrene: Secondary | ICD-10-CM | POA: Diagnosis not present

## 2024-04-27 DIAGNOSIS — E1142 Type 2 diabetes mellitus with diabetic polyneuropathy: Secondary | ICD-10-CM

## 2024-04-27 DIAGNOSIS — Z951 Presence of aortocoronary bypass graft: Secondary | ICD-10-CM | POA: Diagnosis not present

## 2024-04-27 DIAGNOSIS — I10 Essential (primary) hypertension: Secondary | ICD-10-CM

## 2024-04-27 DIAGNOSIS — J449 Chronic obstructive pulmonary disease, unspecified: Secondary | ICD-10-CM | POA: Insufficient documentation

## 2024-04-27 DIAGNOSIS — Z9189 Other specified personal risk factors, not elsewhere classified: Secondary | ICD-10-CM

## 2024-04-27 DIAGNOSIS — Z79899 Other long term (current) drug therapy: Secondary | ICD-10-CM | POA: Diagnosis not present

## 2024-04-27 DIAGNOSIS — I2510 Atherosclerotic heart disease of native coronary artery without angina pectoris: Secondary | ICD-10-CM | POA: Diagnosis not present

## 2024-04-27 DIAGNOSIS — Z8616 Personal history of COVID-19: Secondary | ICD-10-CM | POA: Diagnosis not present

## 2024-04-27 DIAGNOSIS — Z794 Long term (current) use of insulin: Secondary | ICD-10-CM | POA: Diagnosis not present

## 2024-04-27 DIAGNOSIS — E785 Hyperlipidemia, unspecified: Secondary | ICD-10-CM | POA: Insufficient documentation

## 2024-04-27 DIAGNOSIS — B9689 Other specified bacterial agents as the cause of diseases classified elsewhere: Secondary | ICD-10-CM | POA: Insufficient documentation

## 2024-04-27 DIAGNOSIS — I739 Peripheral vascular disease, unspecified: Secondary | ICD-10-CM

## 2024-04-27 DIAGNOSIS — Z833 Family history of diabetes mellitus: Secondary | ICD-10-CM | POA: Insufficient documentation

## 2024-04-27 DIAGNOSIS — B372 Candidiasis of skin and nail: Secondary | ICD-10-CM | POA: Diagnosis not present

## 2024-04-27 DIAGNOSIS — L0889 Other specified local infections of the skin and subcutaneous tissue: Secondary | ICD-10-CM | POA: Insufficient documentation

## 2024-04-27 DIAGNOSIS — R079 Chest pain, unspecified: Secondary | ICD-10-CM

## 2024-04-27 MED ORDER — FLUCONAZOLE 200 MG PO TABS: 200.0000 mg | ORAL_TABLET | Freq: Every day | ORAL | 0 refills | Status: DC

## 2024-04-27 NOTE — Progress Notes (Signed)
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 [address] [place], [place] [address]   APTT     Status: None   Collection Time: 04/08/24  8:57 PM  Result Value Ref Range   aPTT 34 24 - 36 seconds    Comment: Performed at [facility], [address] [place], [place] [address]  Culture, blood (Routine X 2) w Reflex to ID Panel     Status: None   Collection Time: 04/08/24  8:57 PM   Specimen:  BLOOD  Result Value Ref Range   Specimen Description BLOOD BLOOD LEFT WRIST    Special Requests      BOTTLES DRAWN AEROBIC AND ANAEROBIC Blood Culture results may not be optimal due to an inadequate volume of blood received in  culture bottles   Culture      NO GROWTH 5 DAYS Performed at [facility], [address] [place], [place] [address]    Report Status 04/13/2024 FINAL   Culture, blood (Routine X 2) w Reflex to ID Panel     Status: None   Collection Time: 04/08/24  8:57 PM   Specimen: BLOOD  Result Value Ref Range   Specimen Description BLOOD BLOOD LEFT FOREARM    Special Requests      BOTTLES DRAWN AEROBIC ONLY Blood Culture results may not be optimal due to an inadequate volume of blood received in culture bottles   Culture      NO GROWTH 5 DAYS Performed at [facility], [address] [place], [place] [address]    Report Status 04/13/2024 FINAL   HIV Antibody (routine testing w rflx)     Status: None   Collection Time: 04/08/24  8:57 PM  Result Value Ref Range   HIV Screen 4th Generation wRfx Non Reactive Non Reactive    Comment: Performed at [facility], [address], [place], [place] [address]  Glucose, capillary     Status: Abnormal   Collection Time: 04/08/24  9:28 PM  Result Value Ref Range   Glucose-Capillary 180 (H) 70 - 99 mg/dL    Comment: Glucose reference range applies only to samples taken after fasting for at least 8 hours.   Comment 1 Notify RN   Basic metabolic panel     Status: Abnormal   Collection Time: 04/09/24  4:09 AM  Result Value Ref Range   Sodium 136 135 - 145 mmol/L   Potassium 3.9 3.5 - 5.1 mmol/L   Chloride 97 (L) 98 - 111 mmol/L   CO2 26 22 - 32 mmol/L   Glucose, Bld 207 (H) 70 - 99 mg/dL    Comment: Glucose reference range applies only to samples taken after fasting for at least 8 hours.   BUN 62 (H) 8 - 23 mg/dL   Creatinine, Ser 3.18 (H) 0.61 - 1.24 mg/dL   Calcium  8.8 (L) 8.9 - 10.3 mg/dL   GFR, Estimated 8 (L) >60 mL/min    Comment: (NOTE) Calculated using the CKD-EPI Creatinine Equation (2021)    Anion gap 14 5 - 15    Comment: Performed at [facility], [address] [place], [place] [address]  CBC      Status: Abnormal   Collection Time: 04/09/24  4:09 AM  Result Value Ref Range   WBC 9.4 4.0 - 10.5 K/uL   RBC 3.35 (L) 4.22 - 5.81 MIL/uL   Hemoglobin 10.7 (L) 13.0 - 17.0 g/dL   HCT 67.6 (L) 60.9 - 47.9 %   MCV 96.4 80.0 - 100.0 fL   MCH 31.9 26.0 - 34.0 pg   MCHC 33.1 30.0 - 36.0 g/dL   RDW 85.6 88.4 - 84.4 %   Platelets 267 150 - 400 K/uL   nRBC 0.0 0.0 - 0.2 %    Comment: Performed at [facility], [address] [place], [place] [address]  Glucose, capillary     Status: Abnormal   Collection Time: 04/09/24  7:47 AM  Result Value Ref Range  Glucose-Capillary 198 (H) 70 - 99 mg/dL    Comment: Glucose reference range applies only to samples taken after fasting for at least 8 hours.  Hepatitis B surface antigen     Status: None   Collection Time: 04/09/24  9:16 AM  Result Value Ref Range   Hepatitis B Surface Ag NON REACTIVE NON REACTIVE    Comment: Performed at [facility], [address], [place], [place] [address]  Hepatitis B surface antibody,quantitative     Status: None   Collection Time: 04/09/24  9:16 AM  Result Value Ref Range   Hep B S AB Quant (Post) 11.1 Immunity>10 mIU/mL    Comment: (NOTE)  Status of Immunity                     Anti-HBs Level  ------------------                     -------------- Inconsistent with Immunity                  0.0 - 10.0 Consistent with Immunity                         >10.0 Performed At: [facility] [address] [place], [place] [id number] Jennette Shorter MD Ey:[phone]   Glucose, capillary     Status: Abnormal   Collection Time: 04/09/24 11:44 AM  Result Value Ref Range   Glucose-Capillary 184 (H) 70 - 99 mg/dL    Comment: Glucose reference range applies only to samples taken after fasting for at least 8 hours.  Aerobic Culture w Gram Stain (superficial specimen)     Status: None   Collection Time: 04/09/24  4:16 PM   Specimen: WRIST; Wound  Result Value Ref Range   Specimen Description       WRIST LEFT Performed at [facility], [address], [place], [place] [address]    Special Requests      NONE Performed at [facility], [address] [place], [place] [address]    Gram Stain      NO WBC SEEN ABUNDANT YEAST Performed at [facility], [address], [place], [place] [address]    Culture ABUNDANT CANDIDA ALBICANS    Report Status 04/12/2024 FINAL   Glucose, capillary     Status: Abnormal   Collection Time: 04/09/24  4:58 PM  Result Value Ref Range   Glucose-Capillary 253 (H) 70 - 99 mg/dL    Comment: Glucose reference range applies only to samples taken after fasting for at least 8 hours.  Glucose, capillary     Status: Abnormal   Collection Time: 04/09/24  9:35 PM  Result Value Ref Range   Glucose-Capillary 286 (H) 70 - 99 mg/dL    Comment: Glucose reference range applies only to samples taken after fasting for at least 8 hours.   Comment 1 Notify RN   CK     Status: None   Collection Time: 04/10/24  5:34 AM  Result Value Ref Range   Total CK 174 49 - 397 U/L    Comment: Performed at [facility], [address] [place], [place] [address]  Glucose, capillary     Status: Abnormal   Collection Time: 04/10/24  7:46 AM  Result Value Ref Range   Glucose-Capillary 213 (H) 70 - 99 mg/dL    Comment: Glucose reference range applies only to samples  taken after fasting for at least 8 hours.  Glucose, capillary     Status: Abnormal   Collection Time: 04/10/24 11:30 AM  Result Value Ref Range   Glucose-Capillary 327 (H) 70 - 99 mg/dL    Comment: Glucose reference range applies only to samples taken after fasting for at least 8 hours.  Glucose, capillary     Status: Abnormal   Collection Time: 04/10/24  4:38 PM  Result Value Ref Range   Glucose-Capillary 194 (H) 70 - 99 mg/dL    Comment: Glucose reference range applies only to samples taken after fasting for at least 8 hours.  Glucose, capillary     Status: Abnormal   Collection  Time: 04/10/24  9:19 PM  Result Value Ref Range   Glucose-Capillary 189 (H) 70 - 99 mg/dL    Comment: Glucose reference range applies only to samples taken after fasting for at least 8 hours.  Glucose, capillary     Status: Abnormal   Collection Time: 04/11/24  7:57 AM  Result Value Ref Range   Glucose-Capillary 136 (H) 70 - 99 mg/dL    Comment: Glucose reference range applies only to samples taken after fasting for at least 8 hours.  Glucose, capillary     Status: Abnormal   Collection Time: 04/11/24 11:50 AM  Result Value Ref Range   Glucose-Capillary 249 (H) 70 - 99 mg/dL    Comment: Glucose reference range applies only to samples taken after fasting for at least 8 hours.  Glucose, capillary     Status: Abnormal   Collection Time: 04/23/24 11:02 AM  Result Value Ref Range   Glucose-Capillary 123 (H) 70 - 99 mg/dL    Comment: Glucose reference range applies only to samples taken after fasting for at least 8 hours.  I-STAT, chem 8     Status: Abnormal   Collection Time: 04/23/24 11:50 AM  Result Value Ref Range   Sodium 137 135 - 145 mmol/L   Potassium 2.9 (L) 3.5 - 5.1 mmol/L   Chloride 98 98 - 111 mmol/L   BUN 40 (H) 8 - 23 mg/dL   Creatinine, Ser 4.59 (H) 0.61 - 1.24 mg/dL   Glucose, Bld 854 (H) 70 - 99 mg/dL    Comment: Glucose reference range applies only to samples taken after fasting for at least 8 hours.   Calcium , Ion 1.04 (L) 1.15 - 1.40 mmol/L   TCO2 25 22 - 32 mmol/L   Hemoglobin 12.6 (L) 13.0 - 17.0 g/dL   HCT 62.9 (L) 60.9 - 47.9 %  Basic metabolic panel with GFR     Status: Abnormal   Collection Time: 04/23/24 12:05 PM  Result Value Ref Range   Sodium 138 135 - 145 mmol/L   Potassium 2.9 (L) 3.5 - 5.1 mmol/L   Chloride 95 (L) 98 - 111 mmol/L   CO2 25 22 - 32 mmol/L   Glucose, Bld 149 (H) 70 - 99 mg/dL    Comment: Glucose reference range applies only to samples taken after fasting for at least 8 hours.   BUN 44 (H) 8 - 23 mg/dL   Creatinine, Ser 3.81 (H) 0.61  - 1.24 mg/dL   Calcium  9.0 8.9 - 10.3 mg/dL   GFR, Estimated 9 (L) >60 mL/min    Comment: (NOTE) Calculated using the CKD-EPI Creatinine Equation (2021)    Anion gap 18 (H) 5 - 15    Comment: Performed at [facility], [address] [place], [place] [address]  I-STAT,  chem 8     Status: Abnormal   Collection Time: 04/23/24  3:18 PM  Result Value Ref Range   Sodium 137 135 - 145 mmol/L   Potassium 3.0 (L) 3.5 - 5.1 mmol/L   Chloride 98 98 - 111 mmol/L   BUN 40 (H) 8 - 23 mg/dL   Creatinine, Ser 3.59 (H) 0.61 - 1.24 mg/dL   Glucose, Bld 867 (H) 70 - 99 mg/dL    Comment: Glucose reference range applies only to samples taken after fasting for at least 8 hours.   Calcium , Ion 1.04 (L) 1.15 - 1.40 mmol/L   TCO2 23 22 - 32 mmol/L   Hemoglobin 11.9 (L) 13.0 - 17.0 g/dL   HCT 64.9 (L) 60.9 - 47.9 %  Aerobic/Anaerobic Culture w Gram Stain (surgical/deep wound)     Status: None (Preliminary result)   Collection Time: 04/23/24  4:07 PM   Specimen: Path Tissue  Result Value Ref Range   Specimen Description      TISSUE Performed at [facility], [address] [place], [place] [address]    Special Requests LEFT WRIST WD SUPERFICIAL SPEC C    Gram Stain      NO WBC SEEN FEW YEAST Performed at [facility], [address], [place], [place] [address]    Culture      FEW CANDIDA ALBICANS NO ANAEROBES ISOLATED; CULTURE IN PROGRESS FOR 5 DAYS    Report Status PENDING   Acid Fast Smear (AFB)     Status: None   Collection Time: 04/23/24  4:07 PM   Specimen: Path Tissue  Result Value Ref Range   AFB Specimen Processing Comment     Comment: Tissue Grinding and Digestion/Decontamination   Acid Fast Smear Negative     Comment: (NOTE) Performed At: [facility] [address] [place], [place] [id number] Jennette Shorter MD Ey:[phone]    Source (AFB) LEFT WRIST     Comment: Performed at [facility], [address] [place], [place] [address]   Aerobic/Anaerobic Culture w Gram Stain (surgical/deep wound)     Status: None (Preliminary result)   Collection Time: 04/23/24  4:14 PM   Specimen: Path Tissue  Result Value Ref Range   Specimen Description      TISSUE Performed at [facility], [address] [place], [place] [address]    Special Requests LEFT WRIST WD DEEP SPEC A    Gram Stain      NO WBC SEEN NO ORGANISMS SEEN Performed at [facility], [address] [place], [place] [address]    Culture      RARE CANDIDA ALBICANS NO ANAEROBES ISOLATED; CULTURE IN PROGRESS FOR 5 DAYS    Report Status PENDING   Acid Fast Smear (AFB)     Status: None   Collection Time: 04/23/24  4:14 PM   Specimen: Path Tissue  Result Value Ref Range   AFB Specimen Processing Comment     Comment: Tissue Grinding and Digestion/Decontamination   Acid Fast Smear Negative     Comment: (NOTE) Performed At: [facility] [address] [place], [place] [id number] Jennette Shorter MD Ey:[phone]    Source (AFB) TISSUE     Comment: Performed at [facility], [address] [place], [place] [address]  Glucose, capillary     Status: Abnormal   Collection Time: 04/23/24  4:32 PM  Result Value Ref Range   Glucose-Capillary 123 (H) 70 - 99 mg/dL  Comment: Glucose reference range applies only to samples taken after fasting for at least 8 hours.    Radiology US  EKG SITE RITE Result Date: 04/09/2024 If Site Rite image not attached, placement could not be confirmed due to current cardiac rhythm.  MR CERVICAL SPINE W CONTRAST Result Date: 04/09/2024 EXAM: MRI CERVICAL SPINE WITH CONTRAST 04/09/2024 01:28:15 PM TECHNIQUE: Multiplanar multisequence MRI of the cervical spine was performed with the administration of intravenous contrast. CONTRAST: 8 mL GADAVIST  COMPARISON: MRI of the cervical spine 04/08/2024. CLINICAL HISTORY: cervical osteomyelitis FINDINGS: BONES AND ALIGNMENT: Normal alignment. Normal vertebral  body heights. Enhancement of the disc spaces and adjacent vertebral bodies from C4 to C6 consistent with osteomyelitis. SPINAL CORD: Normal spinal cord size. Normal spinal cord signal. No epidural abscess. SOFT TISSUES: Mild enhancement in the prevertebral soft tissues. IMPRESSION: 1. Enhancement of the disc spaces and adjacent vertebral bodies from C4 to C6 consistent with osteomyelitis. 2. Mild prevertebral soft tissue enhancement without epidural or paraspinal abscess. Electronically signed by: Ryan Chess MD 04/09/2024 03:29 PM EST RP Workstation: [id number]   MR WRIST LEFT WO CONTRAST Result Date: 04/09/2024 EXAM: MRI OF THE LEFT WRIST WITHOUT INTRAVENOUS CONTRAST 04/08/2024 11:32:20 PM TECHNIQUE: Multiplanar multisequence MRI of the left wrist was performed without the administration of intravenous contrast. COMPARISON: Radiographs 04/08/2024. CLINICAL HISTORY: Wrist pain, current discitis-osteomyelitis in the cervical spine; Recent perianal abscess drainage; Small wound along the left wrist; End stage renal disease and diabetes. FINDINGS: LIMITATIONS/ARTIFACTS: Motion artifact is present, reducing diagnostic sensitivity and specificity. INTRINSIC LIGAMENTS: Intact scapholunate and lunotriquetral ligaments without interval widening. Tear of a small dorsal ligament on the Lunate. No significant periligamentous edema. EXTRINSIC LIGAMENTS: The volar extrinsic ligaments including the radio-scapho-capitate and radio-luno-triquetral ligaments are grossly intact. TFCC: The triangulofibrocartilage complex is preserved including the articular disc, the meniscal homologue, the extensor carpi ulnas tendon, the volar and dorsal distal radioulnar ligaments, and the ulnolunate and ulnotriquetral ligaments. EXTENSOR TENDONS: Intact extensor tendons without tenosynovitis or tear. CARPAL TUNNEL AND FLEXOR TENDONS: Intact flexor tendons and flexor retinaculum. Normal MRI appearance of the median nerve. Intact ulnar nerve  in Guyon's Canal. The suspected ganglion cyst (described in SOFT TISSUE) is noted in proximity to the [employer] Tendon. JOINT SPACES: Mild degenerative arthropathy at the 1st Carpometacarpal Articulation. No significant joint effusion. Unremarkable alignment. BONE MARROW: No substantial marrow edema is identified to indicate osteomyelitis involving the wrist. No acute fracture or aggressive marrow replacing lesion. SOFT TISSUE: Suspected 1.1 x 0.3 x 0.9 cm ganglion cyst along the anterior rim of the Radius at the Radiocarpal Articulation as shown on image 19 series 11 tracking medial to the Radial Artery in proximity to the Flexor Carpi Radialis Tendon. No significant soft tissue edema or fluid collections other than the described cyst. No significant muscle edema or atrophy. VASCULATURE: Low signal in the walls of the Radial and Ulnar Arteries compatible with atheromatous vascular calcification. Patency not assessed. IMPRESSION: 1. No evidence of osteomyelitis involving the left wrist. 2. Mild degenerative arthropathy at the first carpometacarpal articulation. 3. Suspected 1.1 x 0.3 x 0.9 cm ganglion cyst along the anterior rim of the radius at the radiocarpal articulation, tracking medial to the radial artery but adjacent to the flexor carpi radialis. 4. Low signal in the walls of the radial and ulnar arteries compatible with atheromatous vascular calcification. Patency not assessed. Electronically signed by: Ryan Salvage MD 04/09/2024 08:36 AM EST RP Workstation: [id number]   MR CERVICAL SPINE WO CONTRAST Addendum Date: 04/09/2024 ADDENDUM REPORT:  04/09/2024 01:48 ADDENDUM: Since time of initial dictation, the case was discussed with the covering overnight hospitalist, Dr. Hilma. Per the provided history, patient is clinically not septic and is only with mild neck pain (although the provided clinical indication for the initial MRI from 04/06/2024 was for infection). Given this, it is possible  that the signal changes at the C4-5 level are potentially degenerative in nature, although the presence of paraspinous edema is not typically seen in the setting of degenerative changes alone. Possible infection with osteomyelitis discitis should still be considered until proven otherwise. Additionally, apparently this exam was ordered as a with and without study. Patient was unable to tolerate the full length of the exam and terminated the study early, with no contrast given. Irregardless, the patient history of renal disease with low GFR largely precludes the administration of IV gadolinium contrast. Electronically Signed   By: Morene Hoard M.D.   On: 04/09/2024 01:48   Result Date: 04/09/2024 CLINICAL DATA:  Initial evaluation for acute neck pain. EXAM: MRI CERVICAL SPINE WITHOUT CONTRAST TECHNIQUE: Multiplanar, multisequence MR imaging of the cervical spine was performed. No intravenous contrast was administered. COMPARISON:  MRI from 04/06/2024 FINDINGS: Alignment: Examination mildly limited as the patient was unable to tolerate the full length of the exam. No axial GRE sequence was performed. Straightening with slight reversal of the normal cervical lordosis. 2 mm retrolisthesis of C4 on C5, with trace anterolisthesis of C7 on T1, stable. Vertebrae: Vertebral body height maintained without acute or chronic fracture. Bone marrow signal intensity overall within normal limits. No worrisome osseous lesions. Again seen are signal changes concerning for possible osteomyelitis discitis at C4-5, and to a lesser extent at C5-6, not appreciably changed or progressed from prior. Again, no significant epidural involvement visible on this noncontrast examination. Paraspinous edema without loculated collection. Cord: Normal signal morphology.  No visible epidural collections. Posterior Fossa, vertebral arteries, paraspinal tissues: Patchy signal abnormality within the pons, most characteristic of chronic  microvascular ischemic disease. Craniocervical junction within normal limits. Paraspinous edema centered about the C4-5 interspace without loculated collection. Preserved flow voids within the vertebral arteries bilaterally. Disc levels: Underlying multilevel cervical spondylosis, stable from recent MRI from 04/07/2019 fiber these changes are fully described. IMPRESSION: 1. No significant interval change in signal changes at C4-5, again concerning for possible infection with osteomyelitis discitis. More mild changes at C5-6, also potentially involved. Paraspinous edema without loculated soft tissue abscess. No visible significant epidural involvement on this noncontrast examination. 2. Underlying multilevel cervical spondylosis, stable from recent MRI from 04/07/2019. Electronically Signed: By: Morene Hoard M.D. On: 04/08/2024 21:17   DG Wrist Complete Left Result Date: 04/08/2024 CLINICAL DATA:  Concern for infection. EXAM: DG WRIST COMPLETE 3+V*L* COMPARISON:  None Available. FINDINGS: No acute fracture or dislocation. No significant arthritic change. Advanced atherosclerotic calcification of the vessel. The soft tissues are unremarkable. IMPRESSION: 1. No acute fracture or dislocation. 2. Advanced atherosclerotic calcification of the vessel. Electronically Signed   By: Vanetta Chou M.D.   On: 04/08/2024 17:17   DG Chest 2 View Result Date: 04/08/2024 CLINICAL DATA:  Weakness. EXAM: CHEST - 2 VIEW COMPARISON:  04/01/2024 FINDINGS: Sternotomy wires unchanged. Lungs are adequately inflated without lobar consolidation or effusion. Cardiomediastinal silhouette and remainder of the exam is unchanged. IMPRESSION: No acute cardiopulmonary disease. Electronically Signed   By: Toribio Agreste M.D.   On: 04/08/2024 15:13   MR CERVICAL SPINE WO CONTRAST Result Date: 04/08/2024 CLINICAL DATA:  Initial evaluation for cervical radiculopathy,  infection suspected. EXAM: MRI CERVICAL SPINE WITHOUT CONTRAST  TECHNIQUE: Multiplanar, multisequence MR imaging of the cervical spine was performed. No intravenous contrast was administered. COMPARISON:  Radiograph from 03/25/2024. FINDINGS: Alignment: Mild reversal of the normal cervical lordosis. 2 mm retrolisthesis of C4 on C5, with trace anterolisthesis of C7 on T1. Vertebrae: Vertebral body height maintained without acute or chronic fracture. Bone marrow signal intensity within normal limits. No worrisome osseous lesions. Prominent marrow edema present within the C4 and C5 vertebral bodies. Adjacent paraspinous edema and inflammation. Findings are highly suspicious for possible osteomyelitis discitis at this level. Similar but more mild changes present about the C5-6 level as well, also potentially involved. No visible epidural involvement. Paraspinous edema without loculated collection. Cord: Grossly normal signal and morphology on this motion degraded exam. Posterior Fossa, vertebral arteries, paraspinal tissues: Patchy signal abnormality within the pons, most characteristic of chronic microvascular ischemic disease. Craniocervical junction normal. Paraspinous edema adjacent to the C4-5 interspace. Preserved flow voids within the vertebral arteries bilaterally. Right pleural effusion partially visualized. Disc levels: C2-C3: Minimal disc bulge. Mild bilateral facet hypertrophy. No stenosis. C3-C4: Right paracentral disc protrusion indents the ventral thecal sac. Mild bilateral uncovertebral and facet hypertrophy. No spinal stenosis. Foramina remain patent. C4-C5: Degenerative intervertebral disc space narrowing with findings concerning for osteomyelitis discitis. Flattening and partial effacement of the ventral thecal sac with mild cord flattening. Mild to moderate spinal stenosis. Severe left with moderate right C5 foraminal narrowing. C5-C6: Degenerative vertebral disc space narrowing with diffuse disc osteophyte complex. Flattening of the ventral thecal sac with  resultant mild spinal stenosis. Foramina remain patent. C6-C7: Mild disc bulge with uncovertebral spurring, asymmetric to the right. Flattening of the ventral thecal sac without significant spinal stenosis. Mild right C7 foraminal narrowing. Left neural foramina remains patent. C7-T1: Negative interspace. Mild right-sided facet hypertrophy. No canal or foraminal stenosis. IMPRESSION: 1. Findings concerning for acute osteomyelitis discitis at the C4-5 level. Associated paraspinous edema without loculated collection. No visible epidural involvement. 2. Similar but more mild changes about the C5-6 level, also potentially involved. 3. Multilevel cervical spondylosis with resultant mild to moderate spinal stenosis at C4-5 and C5-6. Severe left with moderate right C5 foraminal narrowing. 4. Right pleural effusion, partially visualized. These results will be called to the ordering clinician or representative by the Radiologist Assistant, and communication documented in the PACS or [employer]. Electronically Signed   By: Morene Hoard M.D.   On: 04/08/2024 05:12   CT Chest Wo Contrast Result Date: 04/01/2024 EXAM: CT CHEST WITHOUT CONTRAST 04/01/2024 06:12:34 AM TECHNIQUE: CT of the chest was performed without the administration of intravenous contrast. Multiplanar reformatted images are provided for review. Automated exposure control, iterative reconstruction, and/or weight based adjustment of the mA/kV was utilized to reduce the radiation dose to as low as reasonably achievable. 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 CLINICAL HISTORY: Respiratory illness, nondiagnostic xray. FINDINGS: MEDIASTINUM: Mild cardiac enlargement. Aortic atherosclerosis. No pericardial effusion. The central airways are clear. LYMPH NODES: Similar appearance of increased multiplicity of prominent mediastinal lymph nodes. Lymph nodes measure up to 1.3 cm in short axis, unchanged from prior examination. This is a  nonspecific finding in the setting of CHF. Hilar lymph nodes suboptimally evaluated due to lack of IV contrast. No axillary lymphadenopathy. LUNGS AND PLEURA: Small to moderate right pleural effusion and small left pleural effusion. Mild interstitial thickening is identified with a lower lung zone predominance concerning for mild edema. Emphysema with diffuse bronchial wall thickening. Subsegmental atelectasis noted within  the posterior right base. Unchanged scarring within the right middle lobe. Posterior left upper lobe subpleural nodule is unchanged, measuring 4 mm (image 25/3). Subpleural nodule in the anterior right upper lobe is unchanged, measuring 3 mm (image 48/3). Right lower lobe lung nodule is unchanged, measuring 5 mm. These are compatible with a benign process requiring no further follow-up. No pneumothorax. SOFT TISSUES/BONES: Status post median sternotomy. No acute abnormality of the bones or soft tissues. UPPER ABDOMEN: Upper abdominal ascites noted. IMPRESSION: 1. Mild interstitial thickening with lower lung zone predominance, compatible with mild pulmonary edema. 2. Small to moderate right pleural effusion and small left pleural effusion. 3. Upper abdominal ascites. 4. Small scattered lung nodules are unchanged from the previous exam and require no further follow-up. Electronically signed by: Waddell Calk MD 04/01/2024 06:38 AM EST RP Workstation: [id number]   DG Chest Portable 1 View Result Date: 04/01/2024 EXAM: 1 VIEW(S) XRAY OF THE CHEST 04/01/2024 04:25:43 AM COMPARISON: 03/24/2023 CLINICAL HISTORY: 63 year old male with acute dyspnea and history of peritoneal dialysis. FINDINGS: LUNGS AND PLEURA: Low lung volumes. Patchy right lung base opacity most resembles atelectasis. Increased pulmonary interstitium elsewhere, mild or developing interstitial edema not excluded. No pleural effusion. No pneumothorax. HEART AND MEDIASTINUM: Prior sternotomy. Mediastinal contours are stable and within  normal limits. Calcified aortic atherosclerosis. BONES AND SOFT TISSUES: Prior sternotomy. No acute osseous abnormality. IMPRESSION: 1. Mild or developing interstitial edema cannot be excluded. 2. Patchy right lung base opacity most resembles atelectasis. Electronically signed by: Helayne Hurst MD 04/01/2024 05:09 AM EST RP Workstation: [id number]    Assessment/Plan  Assessment & Plan Peripheral artery disease Chronic peripheral artery disease with non-compressible arteries due to calcification. Adequate blood flow to toes and digits with stable circulation. Risk factors include renal failure and diabetes. - Continue regular use of moisturizers. - Maintain clean and moist skin. - Continue aspirin  and Repatha . - Monitor circulation every six months. - Address major changes in circulation if he has infection or worsening wounds of the lower extremity.  ESRD on dialysis [facility]) Doing PD    Diabetes (HCC) blood glucose control important in reducing the progression of atherosclerotic disease. Also, involved in wound healing. On appropriate medications.     Essential hypertension blood pressure control important in reducing the progression of atherosclerotic disease. On appropriate oral medications   Kapil Petropoulos, MD  04/27/2024 2:19 PM    This note was created with Dragon medical transcription system.  Any errors from dictation are purely unintentional

## 2024-04-27 NOTE — Progress Notes (Signed)
 NAME: [name] [name]  DOB: September 24, 1960 6 weeks of daptomycin  and ceftriaxone .  Date/Time: 04/27/2024 12:05 PM   Subjective:  Follow up after recent hospitalization Patient here with his wife ? [name] [name] is a 63 y.o. with a history of end-stage renal disease on peritoneal dialysis, cervical foraminal stenosis, hypertension, diabetes mellitus, COPD, peripheral vascular disease, severe peripheral neuropathy, CAD status post CABG, congestive heart failure, depression with anxiety, migraine was recently in the hospital from 04/08/2024 until 04/11/2024 for neck pain and an MRI done on 04/06/2024 that showed a possible discitis at the C4-C5 level.  Patient was seen by neurosurgery but did not recommend any intervention IR could not perform disc aspiration because of the site so it was decided that we will treat him empirically with 6 weeks of daptomycin  and ceftriaxone .  Patient also had multiple wounds in his fingers and in his left wrist area.  A culture was taken from the left wrist area and that was Candida albicans.  So fluconazole  was called in after his discharge dose of 200 mg once a day for 2 weeks Patient saw orthopedic surgeon and had eschar debridement on 04/23/2024.  Surgical culture was also Candida albicans He had seen the wound clinic Dr. In [facility] Dr. Harden.  He was asked to clean it with Vashe and asked to follow-up in 3 weeks. Patient is doing okay No specific complaints He has been taking daptomycin  and ceftriaxone  which his wife does every day No side effects from medications.   Past Medical History:  Diagnosis Date   Anemia    Arthritis    CAD (coronary artery disease)    a. 12/2016 s/p CABG x 4 (LIMA->LAD, VG->RCA, VG->OM1, VG->D1); b. 02/2018 MV: EF 35%, small-med inferolaterlal infarct. No ischemia.   CKD (chronic kidney disease), stage IV [facility])    Peritoneal Dialysis   Colon polyps    COPD (chronic obstructive pulmonary disease) (HCC)    Dupre's syndrome     GERD (gastroesophageal reflux disease)    HFrEF (heart failure reduced ejection fraction) (HCC)    a.) 2018 EF 35%; b.) 02/2018 EF 50%; c). 02/2019 EF 40-45%; d.) 08/2019 Echo: EF 50-55%, no rwma, GrII DD; e.) 04/2020 Echo: EF 30-35%, mod-sev glob HK. Mild LVH. G2DD. Low-nl RV fxn. Mildly dil LA. Mild MR. Mild-mod Ao sclerosis w/o stenosis; f.) TTE 02/09/2021: EF 55%; septal HK, LAE; G2DD.   History of 2019 novel coronavirus disease (COVID-19) 02/08/2021   Hyperlipidemia    Hypertension    Ischemic cardiomyopathy    a.) 02/2018 MV: EF 35%; b.) 08/2019 Echo: EF 50-55%; c.) 04/2020 Echo: EF 30-35%; d.) TTE 02/09/2021: EF 50-55%.   Lymphedema    Migraine    Myocardial infarction (HCC)    Neuropathy    PAD (peripheral artery disease)    a. 04/2017 Aortoiliac duplex: R iliac dzs; b. 03/2020 LE Duplex: mild bilat atherosclerosis throughout. Patent vessels.   Pneumonia    Retinopathy    S/P CABG x 4    a.)  4v CABG: LIMA->LAD, SVG->RCA, SVG->OM1, SVG->D1   Sleep apnea    a.) does not require nocturnal PAP therapy   Statin intolerance    Stomach ulcer    T2DM (type 2 diabetes mellitus) (HCC)     Past Surgical History:  Procedure Laterality Date   bone graft surgery Bilateral    on both feet x 2   CAPD INSERTION N/A 11/13/2021   Procedure: LAPAROSCOPIC INSERTION CONTINUOUS AMBULATORY PERITONEAL DIALYSIS  (CAPD) CATHETER;  Surgeon: Jordis,  Laneta FALCON, MD;  Location: [facility] ORS;  Service: General;  Laterality: N/A;   CAPD REVISION N/A 01/10/2022   Procedure: LAPAROSCOPIC REVISION CONTINUOUS AMBULATORY PERITONEAL DIALYSIS  (CAPD) CATHETER;  Surgeon: Jordis Laneta FALCON, MD;  Location: [facility] ORS;  Service: General;  Laterality: N/A;   CARDIAC CATHETERIZATION  2013   S/p PCI    CARDIAC CATHETERIZATION  2018   S/p CABG   CATARACT EXTRACTION Bilateral    COLONOSCOPY     COLONOSCOPY WITH PROPOFOL  N/A 10/17/2022   Procedure: COLONOSCOPY WITH PROPOFOL ;  Surgeon: Jinny Carmine, MD;  Location: [facility] ENDOSCOPY;   Service: Endoscopy;  Laterality: N/A;   CORONARY ARTERY BYPASS GRAFT  2018   (LIMA-LAD,VG-RCA,VG-OM1,VG-D1)   ESOPHAGOGASTRODUODENOSCOPY (EGD) WITH PROPOFOL  N/A 04/04/2020   Procedure: ESOPHAGOGASTRODUODENOSCOPY (EGD) WITH PROPOFOL ;  Surgeon: Jinny Carmine, MD;  Location: [facility] END[facility];  Service: Endoscopy;  Laterality: N/A;   ESOPHAGOGASTRODUODENOSCOPY (EGD) WITH PROPOFOL  N/A 06/06/2020   Procedure: ESOPHAGOGASTRODUODENOSCOPY (EGD) WITH PROPOFOL ;  Surgeon: Jinny Carmine, MD;  Location: [facility];  Service: Endoscopy;  Laterality: N/A;   ESOPHAGOGASTRODUODENOSCOPY (EGD) WITH PROPOFOL  N/A 09/24/2022   Procedure: ESOPHAGOGASTRODUODENOSCOPY (EGD) WITH PROPOFOL ;  Surgeon: Jinny Carmine, MD;  Location: [facility];  Service: Endoscopy;  Laterality: N/A;   INCISION AND DRAINAGE OF WOUND Left 04/23/2024   Procedure: IRRIGATION AND DEBRIDEMENT WOUND;  Surgeon: Gust Molly, DO;  Location: [facility] OR[facility];  Service: Orthopedics;  Laterality: Left;  LEFT WRIST DEBRIDEMENT OF WOUND   PERIPHERAL VASCULAR CATHETERIZATION Right 10/28/2016   PTA/DEB Right SFA   WRIST SURGERY Left    cyst    Social History   Socioeconomic History   Marital status: Married    Spouse name: Roxanne   Number of children: Not on file   Years of education: Not on file   Highest education level: Not on file  Occupational History   Not on file  Tobacco Use   Smoking status: Every Day    Current packs/day: 0.25    Average packs/day: 0.3 packs/day for 39.0 years (9.8 ttl pk-yrs)    Types: Cigarettes    Passive exposure: Past   Smokeless tobacco: Never  Vaping Use   Vaping status: Never Used  Substance and Sexual Activity   Alcohol use: Not Currently    Comment: very rarely - wine   Drug use: Never   Sexual activity: Not Currently  Other Topics Concern   Not on file  Social History Narrative   Not on file   Social Drivers of Health   Financial Resource Strain: Low Risk  (10/30/2023)   Received from [facility]   Overall Financial Resource Strain (CARDIA)    Difficulty of Paying Living Expenses: Not hard at all  Food Insecurity: No Food Insecurity (04/09/2024)   Hunger Vital Sign    Worried About Running Out of Food in the Last Year: Never true    Ran Out of Food in the Last Year: Never true  Transportation Needs: No Transportation Needs (04/09/2024)   PRAPARE - Administrator, Civil Service (Medical): No    Lack of Transportation (Non-Medical): No  Physical Activity: Unknown (09/09/2021)   Received from [facility]   Exercise Vital Sign    On average, how many days per week do you engage in moderate to strenuous exercise (like a brisk walk)?: 0 days    Minutes of Exercise per Session: Not on file  Stress: No Stress Concern Present (02/23/2023)   [employer] of Occupational Health - Occupational Stress Questionnaire  Feeling of Stress : Only a little  Social Connections: Somewhat Isolated (09/09/2021)   Received from [facility]   Social Network    How would you rate your social network (family, work, friends)?: Restricted participation with some degree of social isolation  Intimate Partner Violence: Not At Risk (04/09/2024)   Humiliation, Afraid, Rape, and Kick questionnaire    Fear of Current or Ex-Partner: No    Emotionally Abused: No    Physically Abused: No    Sexually Abused: No    Family History  Problem Relation Age of Onset   Diabetes Mother    Heart disease Father    Allergies  Allergen Reactions   Iodinated Contrast Media Other (See Comments)    Kidney disease  Kidney disease  Kidney disease  Kidney disease   Other Other (See Comments)    Pain  With joint stiffness     Statins Other (See Comments)    Pain  With joint stiffness   Pain  With joint stiffness  Other Reaction(s): Joint Pain   Pregabalin Other (See Comments)    Generalized aches and pains Generalized aches and pains Generalized aches and pains Generalized aches and  pains Generalized aches and pains   Sacubitril-Valsartan Other (See Comments)    Hyperkalemia  Hyperkalemia Hyperkalemia  Hyperkalemia   Atorvastatin Other (See Comments)    Muscle aches Muscle aches   Pravastatin Other (See Comments)    Muscle aches Muscle aches   Rosuvastatin Other (See Comments)    Muscle Aches Other reaction(s): JOINT PAIN Other reaction(s): JOINT PAIN Muscle Aches    I? Current Outpatient Medications  Medication Sig Dispense Refill   albuterol  (VENTOLIN  HFA) 108 (90 Base) MCG/ACT inhaler Inhale 1-2 puffs into the lungs every 6 (six) hours as needed for wheezing or shortness of breath. 1 each 3   ALPRAZolam  (XANAX ) 0.5 MG tablet Take 1 tablet (0.5 mg total) by mouth 2 (two) times daily as needed for anxiety. 40 tablet 0   amLODipine  (NORVASC ) 10 MG tablet Take 1 tablet (10 mg total) by mouth daily. 90 tablet 3   aspirin  EC 81 MG tablet Take 81 mg by mouth daily. Swallow whole.     bisacodyl  (DULCOLAX) 5 MG EC tablet Take 10 mg by mouth 2 (two) times daily as needed for moderate constipation.     carvedilol  (COREG ) 3.125 MG tablet Take 1 tablet (3.125 mg total) by mouth 2 (two) times daily with a meal. 180 tablet 3   cefTRIAXone  (ROCEPHIN ) IVPB Inject 2 g into the vein daily. Indication:  Cervical spine discitis First Dose: Yes Last Day of Therapy:  05/20/2024 Labs - Once weekly:  CBC/D, CMP, CPK, ESR and CRP Fax weekly lab results  promptly to [phone] Method of administration: IV Push Method of administration may be changed at the discretion of home infusion pharmacist based upon assessment of the patient and/or caregiver's ability to self-administer the medication ordered. Radiology to remove central line after completion of antibiotic Call [phone] with any questions or critical value 40 Units 0   cholecalciferol  (VITAMIN D3) 25 MCG (1000 UNIT) tablet Take 2,000 Units by mouth 2 (two) times daily.     cloNIDine  (CATAPRES ) 0.1 MG tablet Take 1  tablet (0.1 mg total) by mouth 2 (two) times daily as needed (pressure >175). 60 tablet 3   daptomycin  (CUBICIN ) IVPB Inject 700 mg into the vein every other day. Indication:  Cervical spine discitis First Dose: Yes Last Day of Therapy:  05/20/2024 Labs -  Once weekly:  CBC/D, CMP, CPK, ESR and CRP Fax weekly lab results  promptly to [phone] Method of administration: IV Push Method of administration may be changed at the discretion of home infusion pharmacist based upon assessment of the patient and/or caregiver's ability to self-administer the medication ordered. Radiology to remove central line after completion of antibiotic Call [phone] with any questions or critical value 20 Units 0   Evolocumab  (REPATHA  SURECLICK) 140 MG/ML SOAJ INJECT 1 ML UNDER THE SKIN EVERY 14 DAYS 6 mL 3   ezetimibe  (ZETIA ) 10 MG tablet Take 1 tablet (10 mg total) by mouth daily. 90 tablet 3   fluconazole  (DIFLUCAN ) 200 MG tablet Take 1 tablet (200 mg total) by mouth daily. 14 tablet 0   gentamicin  cream (GARAMYCIN ) 0.1 % Apply 1 Application topically 3 (three) times daily.     HUMALOG  MIX 75/25 KWIKPEN (75-25) 100 UNIT/ML KwikPen Inject 20 Units into the skin every morning. 75-25 pen injectior     hydrALAZINE  (APRESOLINE ) 100 MG tablet Take 1 tablet (100 mg total) by mouth 3 (three) times daily. (Patient taking differently: Take 100 mg by mouth 2 (two) times daily.) 270 tablet 3   insulin  lispro (HUMALOG ) 100 UNIT/ML KwikPen Inject 0-15 Units into the skin 3 (three) times daily.     Insulin  Pen Needle (B-D ULTRAFINE III SHORT PEN) 31G X 8 MM MISC To use with pen basal and mealtime coverage insulin  pens 5 times daily. DX: E11.65 500 each 1   isosorbide  mononitrate (IMDUR ) 30 MG 24 hr tablet Take 1 tablet (30 mg total) by mouth 2 (two) times daily. 180 tablet 3   nicotine  (NICODERM CQ  - DOSED IN MG/24 HOURS) 21 mg/24hr patch Place 1 patch (21 mg total) onto the skin daily. 28 patch 0   nitroGLYCERIN   (NITROSTAT ) 0.4 MG SL tablet Place 1 tablet (0.4 mg total) under the tongue every 5 (five) minutes as needed for chest pain. 25 tablet 3   nortriptyline  (PAMELOR ) 50 MG capsule Take 50 mg by mouth at bedtime.     oxyCODONE  (OXY IR/ROXICODONE ) 5 MG immediate release tablet Take 1 tablet (5 mg total) by mouth 2 (two) times daily as needed for severe pain (pain score 7-10). 60 tablet 0   pantoprazole  (PROTONIX ) 40 MG tablet Take 1 tablet (40 mg total) by mouth 2 (two) times daily before a meal. 180 tablet 3   torsemide  (DEMADEX ) 20 MG tablet Take 1 tablet (20 mg total) by mouth 2 (two) times daily. (Patient taking differently: Take 40-60 mg by mouth 2 (two) times daily. 60 mg every morning and 40 mg 12 hours later) 180 tablet 3   TOUJEO  MAX SOLOSTAR 300 UNIT/ML Solostar Pen Inject 60 Units into the skin daily.     No current facility-administered medications for this visit.     Abtx:  Anti-infectives (From admission, onward)    None       REVIEW OF SYSTEMS:  Const: negative fever, negative chills, in wheelchair Eyes: negative diplopia or visual changes, negative eye pain ENT: negative coryza, negative sore throat Resp: negative cough, hemoptysis, dyspnea Cards: negative for chest pain, palpitations, lower extremity edema GU: negative for frequency, dysuria and hematuria GI: Negative for abdominal pain, diarrhea, bleeding, constipation Skin: As above sHeme: negative for easy bruising and gum/nose bleeding MS:  weakness Neurolo: Migraine  psych: anxiety, Endocrine: negative for thyroid , diabetes Allergy/Immunology-as above Objective:  VITALS:  BP (!) 150/91   Pulse 94   Temp (!) 97.3 F (36.3  C) (Temporal)   SpO2 92%  LDA Right PICC PHYSICAL EXAM:  General: Alert, cooperative, no distress, appears stated age.  Pale Lungs: Clear to auscultation bilaterally. No Wheezing or Rhonchi. No rales. Heart: Regular rate and rhythm, no murmur, rub or gallop. Abdomen: Soft, non-tender,not  distended. Bowel sounds normal. No masses Extremities: Multiple wounds on his fingers that are superficial On the left wrist the wound has been debrided and the scab has been lifted  04/21/2024   Skin: No rashes or lesions. Or bruising Lymph: Cervical, supraclavicular normal. Neurologic: Did not examine in detail.  Pertinent Labs Lab Results CBC    Component Value Date/Time   WBC 9.4 04/09/2024 0409   RBC 3.35 (L) 04/09/2024 0409   HGB 11.9 (L) 04/23/2024 1518   HGB 10.5 (L) 03/02/2024 1449   HCT 35.0 (L) 04/23/2024 1518   HCT 30.9 (L) 03/02/2024 1449   PLT 267 04/09/2024 0409   PLT 236 03/02/2024 1449   MCV 96.4 04/09/2024 0409   MCV 96 03/02/2024 1449   MCV 91 12/29/2012 1200   MCH 31.9 04/09/2024 0409   MCHC 33.1 04/09/2024 0409   RDW 14.3 04/09/2024 0409   RDW 13.0 03/02/2024 1449   RDW 13.7 12/29/2012 1200   LYMPHSABS 1.2 04/08/2024 1421   LYMPHSABS 1.4 03/02/2024 1449   LYMPHSABS 3.6 12/29/2012 1200   MONOABS 0.7 04/08/2024 1421   MONOABS 0.8 12/29/2012 1200   EOSABS 0.3 04/08/2024 1421   EOSABS 0.2 03/02/2024 1449   EOSABS 0.6 12/29/2012 1200   BASOSABS 0.1 04/08/2024 1421   BASOSABS 0.1 03/02/2024 1449   BASOSABS 0.2 (H) 12/29/2012 1200       Latest Ref Rng & Units 04/23/2024    3:18 PM 04/23/2024   12:05 PM 04/23/2024   11:50 AM  CMP  Glucose 70 - 99 mg/dL 867  850  854   BUN 8 - 23 mg/dL 40  44  40   Creatinine 0.61 - 1.24 mg/dL 3.59  3.81  4.59   Sodium 135 - 145 mmol/L 137  138  137   Potassium 3.5 - 5.1 mmol/L 3.0  2.9  2.9   Chloride 98 - 111 mmol/L 98  95  98   CO2 22 - 32 mmol/L  25    Calcium  8.9 - 10.3 mg/dL  9.0        Microbiology: Recent Results (from the past 240 hours)  Aerobic/Anaerobic Culture w Gram Stain (surgical/deep wound)     Status: None (Preliminary result)   Collection Time: 04/23/24  4:07 PM   Specimen: Path Tissue  Result Value Ref Range Status   Specimen Description   Final    TISSUE Performed at [facility], [address] [place], [place] [address]    Special Requests LEFT WRIST WD SUPERFICIAL SPEC C  Final   Gram Stain   Final    NO WBC SEEN FEW YEAST Performed at [facility], [address] [place], [place] [address]    Culture   Final    FEW CANDIDA ALBICANS NO ANAEROBES ISOLATED; CULTURE IN PROGRESS FOR 5 DAYS    Report Status PENDING  Incomplete  Acid Fast Smear (AFB)     Status: None   Collection Time: 04/23/24  4:07 PM   Specimen: Path Tissue  Result Value Ref Range Status   AFB Specimen Processing Comment  Final    Comment: Tissue Grinding and Digestion/Decontamination   Acid Fast Smear Negative  Final  Comment: (NOTE) Performed At: [facility] [address] [place], [place] [id number] Jennette Shorter MD Ey:[id number]    Source (AFB) LEFT WRIST  Final    Comment: Performed at [facility], [address] [place], [place] [address]  Aerobic/Anaerobic Culture w Gram Stain (surgical/deep wound)     Status: None (Preliminary result)   Collection Time: 04/23/24  4:14 PM   Specimen: Path Tissue  Result Value Ref Range Status   Specimen Description   Final    TISSUE Performed at [facility], [address] [place], [place] [address]    Special Requests LEFT WRIST WD DEEP SPEC A  Final   Gram Stain   Final    NO WBC SEEN NO ORGANISMS SEEN Performed at [facility], [address], [place], [place] [address]    Culture   Final    RARE CANDIDA ALBICANS NO ANAEROBES ISOLATED; CULTURE IN PROGRESS FOR 5 DAYS    Report Status PENDING  Incomplete  Acid Fast Smear (AFB)     Status: None   Collection Time: 04/23/24  4:14 PM   Specimen: Path Tissue  Result Value Ref Range Status   AFB Specimen Processing Comment  Final    Comment: Tissue Grinding and Digestion/Decontamination   Acid Fast Smear Negative  Final    Comment: (NOTE) Performed At: [facility] [address] [place], [place]  [id number] Jennette Shorter MD Ey:[id number]    Source (AFB) TISSUE  Final    Comment: Performed at [facility], [address] [place], [place] [address]    Lines and Device Date on insertion # of days DC  Central line     Foley     ETT      Patient has: []  acute illness w/systemic sxs  [mod] []  illness posing risk to life or function  [high]  I reviewed:  (3+) []  primary team note []  consultant note(s) []  procedure/op note(s) []  micro result(s)   []  CBC results []  chemistry results []  radiology report(s) []  nursing note(s)  I independently visualized:  (any)   []  cxs/plates in lab []  plain film images []  CT images []  PET images   []  path slide(s) []  ECG tracing []  MRI images []  nuclear scan  I discussed: (any) []  micro and/or path w/lab personnel []  drug options and/or interactions w/ID pharmD   []  procedure/OR findings w/other MD(s) []  echo and/or imaging w/other MD(s)   []  mgm't w/attending(s) involved in case []  setting up home abx w/OPAT team  Mgm't requires: []  prescription drug(s)  [mod] []  intensive toxicity monitoring  [high]    ? Impression/Recommendation ?Cervical spine discits C4-C5 Blood culture neg No aspiration could be done and neurosurgery did not want to do open biopsy because of high risk Being treated with daptomycin  and ceftriaxone  for 6 weeks until 05/20/2024. Then it can be discontinued and PICC can be removed Need to get labs to look at CK level  Candida albicans infected left wrist wound status post debridement On fluconazole  has been taking it for 2 weeks Will continue for another 4 weeks Today will get EKG to look at QT intervals  End-stage renal disease on PD  Hyperlipidemia on Zetia  and Repatha   CAD status post CABG  Diabetes mellitus on insulin  Patient will follow-up with me in January.  If he has to see ID before then Dr. Epifanio can be contacted as I will be away. Discussed the management with the patient and his wife in  great detail.   ________________________________________________ Discussed with patient, requesting provider Note:  This document was prepared using Dragon voice recognition software and may include unintentional dictation errors.

## 2024-04-27 NOTE — Telephone Encounter (Addendum)
 Gastroenterology referral sent via proficient to [facility]. emailed patient. Gave pt telephone # [phone]

## 2024-04-27 NOTE — Patient Instructions (Addendum)
 You are here ftofollow up of cervical spine discitis and you are on daptomycin  and ceftriaxone - you will complete 6 weeks on 05/20/24- PICC will be removed after that Continue fluconazole  200mg  once a day for 30 days.

## 2024-04-27 NOTE — Progress Notes (Signed)
 Office Visit Note   Patient: [name] [name]           Date of Birth: May 09, 1961           MRN: [id number] Visit Date: 04/26/2024              Requested by: Liana Fish, NP [address] [place],  [place] [address] PCP: Liana Fish, NP  Chief Complaint  Patient presents with   Left Wrist - Wound Check      HPI: Discussed the use of AI scribe software for clinical note transcription with the patient, who gave verbal consent to proceed.  History of Present Illness [name] [name] is a 63 year old male with diabetes and end stage renal disease on peritoneal dialysis who presents with ischemic ulceration on the left wrist.  He is status post irrigation and debridement of a left wrist wound performed on November 28th. The wound had been present for approximately two months prior to the procedure. Cultures from the wound were positive for fungal infection, and he has been on fluconazole  for 14 days. He is currently using Vaseline on the wound to keep it soft.  He has a neck infection that is nonoperable and has been treated with IV antibiotics via a PICC line. The neck infection began as a rash, progressed to a whitish appearance, then turned gray and black. The infection is not yet in the bone but was in the disc space. He is uncertain if this infection is related to the one in his wrist.  He uses a motorized wheelchair due to neuropathy and inability to walk or stand, which he attributes to diabetes and a history of quadruple bypass surgery. He is on peritoneal dialysis for end stage renal disease. He also takes probiotics and a regular pill once a day, though the specific medication is not mentioned. Patient is a 63 year old gentleman who is seen in consultation for ischemic ulcer hypothenar eminence left wrist.  Patient states the ulcer has been there for 2 months prior to debridement with Dr. Gust on April 23, 2024.  Patient states tissue cultures show fungus and  he was started on fluconazole .  Patient also has a PICC line for discitis in his cervical spine with the PICC line on the left as well.    Assessment & Plan: Visit Diagnoses:  1. Abrasion, wrist w/ infection, left, initial encounter     Plan: Assessment and Plan Assessment & Plan Ischemic ulceration of left palmar hypothenar eminence with necrotic eschar Ischemic ulceration with necrotic eschar, 2 cm, left palmar hypothenar eminence. Strong biphasic radial pulse, monophasic ulnar pulse on Doppler. No ascending cellulitis. Likely due to decreased circulation. - Start Vashe dressing changes daily with Dial  soap cleansing. - Re-evaluate in two weeks.  Fungal infection of left wrist wound (Candida) status post irrigation and debridement Fungal infection of left wrist wound, positive for Candida, post irrigation and debridement. On fluconazole . - Continue fluconazole  as prescribed. Assessment: Necrotic ulcer hypothenar eminence left wrist.  Plan: Will evaluate for surgical debridement at follow-up.  Risk and benefits were discussed including persistent ulceration and need for additional surgery.  Patient states he understands we will evaluate follow-up.     Follow-Up Instructions: Return in about 4 weeks (around 05/24/2024).   Ortho Exam  Patient is alert, oriented, no adenopathy, well-dressed, normal affect, normal respiratory effort. Physical Exam CARDIOVASCULAR: No palpable radial or ulnar pulse. Doppler shows strong biphasic radial pulse and monophasic ulnar pulse. EXTREMITIES: Ulcer with dark  eschar, 2 cm in diameter, on palmar hypothenar eminence. No ascending cellulitis.  Patient has multiple ulcers over all of his digits. On examination of the left wrist I cannot palpate a radial or ulnar pulse.  With the Doppler patient has a strong biphasic radial pulse and a dampened monophasic ulnar pulse.    Imaging: No results found. No images are attached to the encounter.  Labs: Lab  Results  Component Value Date   HGBA1C 6.6 11/20/2023   HGBA1C 7.6 (H) 03/25/2023   HGBA1C 13.2 (A) 12/07/2020   ESRSEDRATE 72 (H) 05/24/2020   CRP 7 05/24/2020   REPTSTATUS PENDING 04/23/2024   GRAMSTAIN  04/23/2024    NO WBC SEEN NO ORGANISMS SEEN Performed at [facility], [address] [place], [place] [address]    CULT  04/23/2024    RARE CANDIDA ALBICANS NO ANAEROBES ISOLATED; CULTURE IN PROGRESS FOR 5 DAYS    LABORGA ESCHERICHIA COLI (A) 08/29/2020     Lab Results  Component Value Date   ALBUMIN  3.3 (L) 04/08/2024   ALBUMIN  3.2 (L) 04/01/2024   ALBUMIN  3.4 (L) 03/02/2024    Lab Results  Component Value Date   MG 2.0 03/24/2023   MG 2.1 02/09/2021   MG 2.1 05/24/2020   No results found for: VD25OH  No results found for: PREALBUMIN    Latest Ref Rng & Units 04/23/2024    3:18 PM 04/23/2024   11:50 AM 04/09/2024    4:09 AM  CBC EXTENDED  WBC 4.0 - 10.5 K/uL   9.4   RBC 4.22 - 5.81 MIL/uL   3.35   Hemoglobin 13.0 - 17.0 g/dL 88.0  87.3  89.2   HCT 39.0 - 52.0 % 35.0  37.0  32.3   Platelets 150 - 400 K/uL   267      There is no height or weight on file to calculate BMI.  Orders:  No orders of the defined types were placed in this encounter.  No orders of the defined types were placed in this encounter.    Procedures: No procedures performed  Clinical Data: No additional findings.  ROS:  All other systems negative, except as noted in the HPI. Review of Systems  Objective: Vital Signs: There were no vitals taken for this visit.  Specialty Comments:  No specialty comments available.  PMFS History: Patient Active Problem List   Diagnosis Date Noted   Wrist wound, open, simple, left, initial encounter 04/23/2024   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) 04/09/2024   Osteomyelitis of cervical spine (HCC) 04/08/2024   ESRD on peritoneal dialysis (HCC) 04/08/2024   HLD (hyperlipidemia) 04/08/2024   COPD  (chronic obstructive pulmonary disease) (HCC) 04/08/2024   Left wrist pain 04/08/2024   Perianal abscess 04/08/2024   Gastroesophageal reflux disease without esophagitis 01/28/2024   Drug-induced constipation 01/28/2024   Hyperkalemia 03/24/2023   CAD (coronary artery disease) 03/24/2023   Hx of CABG 03/24/2023   Anemia 03/24/2023   Symptomatic bradycardia 03/24/2023   ESRD on dialysis (HCC) 02/24/2023   Peritoneal dialysis catheter in place 02/24/2023   Aortic atherosclerosis 02/24/2023   Polyp of transverse colon 10/17/2022   Special screening for malignant neoplasms, colon 10/17/2022   Barrett's esophagus with dysplasia 09/24/2022   Hypokalemia    Wheeze    COVID-19 virus infection    Elevated d-dimer    Elevated troponin    Acute kidney injury superimposed on CKD    Hyponatremia    Chronic systolic heart  failure (HCC)    Barrett's esophagus with low grade dysplasia    Diabetes mellitus with stage 4 chronic kidney disease GFR 15-29 (HCC) 06/01/2020   Serum albumin  decreased 06/01/2020   Vitamin D  deficiency 06/01/2020   Elevated hemoglobin A1c 06/01/2020   Elevated sed rate 06/01/2020   Elevated brain natriuretic peptide (BNP) level 06/01/2020   Insulin  dependent type 2 diabetes mellitus, uncontrolled 06/01/2020   Diabetes mellitus with complication, with long-term current use of insulin  (HCC) 06/01/2020   Chronic pain syndrome 05/24/2020   Pharmacologic therapy 05/24/2020   Disorder of skeletal system 05/24/2020   Problems influencing health status 05/24/2020   DDD (degenerative disc disease), cervical 05/24/2020   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, C5-6) (Left) 05/24/2020   Cervical Grade 1 Retrolisthesis of C4/C5 and C5/C6 05/24/2020   Chronic hand pain (Bilateral) 05/24/2020   Chronic leg and foot pain (Bilateral) 05/24/2020   Chronic lower extremity numbness from knee down (Bilateral) 05/24/2020   Chronic greater occipital neuralgia (Bilateral) 05/24/2020    Cervical facet syndrome (Left) 05/24/2020   Cervical disc disease with myelopathy 04/26/2020   Cervicalgia 04/09/2020   Syncope and collapse 04/09/2020   Esophageal dysphagia    Gastroesophageal reflux disease with esophagitis without hemorrhage    PAD (peripheral artery disease) 09/27/2019   Diabetes (HCC) 09/14/2019   Swelling of limb 09/14/2019   Lymphedema 09/14/2019   Shortness of breath 09/05/2019   Chest pain 07/16/2018   Essential hypertension 04/28/2018   History of pancreatitis 11/16/2017   Statin-induced myositis 11/16/2017   Type 2 diabetes mellitus with diabetic polyneuropathy, with long-term current use of insulin  (HCC) 11/16/2017   Coronary atherosclerosis 11/15/2016   Septic olecranon bursitis 10/14/2011   Cellulitis 10/06/2011   Depression with anxiety 02/22/2011   Diabetic peripheral neuropathy (HCC) 02/22/2011   Migraines 02/22/2011   Other hyperlipidemia 02/22/2011   Sleep apnea 11/22/2010   Past Medical History:  Diagnosis Date   Anemia    Arthritis    CAD (coronary artery disease)    a. 12/2016 s/p CABG x 4 (LIMA->LAD, VG->RCA, VG->OM1, VG->D1); b. 02/2018 MV: EF 35%, small-med inferolaterlal infarct. No ischemia.   CKD (chronic kidney disease), stage IV [facility])    Peritoneal Dialysis   Colon polyps    COPD (chronic obstructive pulmonary disease) (HCC)    Dupre's syndrome    GERD (gastroesophageal reflux disease)    HFrEF (heart failure reduced ejection fraction) (HCC)    a.) 2018 EF 35%; b.) 02/2018 EF 50%; c). 02/2019 EF 40-45%; d.) 08/2019 Echo: EF 50-55%, no rwma, GrII DD; e.) 04/2020 Echo: EF 30-35%, mod-sev glob HK. Mild LVH. G2DD. Low-nl RV fxn. Mildly dil LA. Mild MR. Mild-mod Ao sclerosis w/o stenosis; f.) TTE 02/09/2021: EF 55%; septal HK, LAE; G2DD.   History of 2019 novel coronavirus disease (COVID-19) 02/08/2021   Hyperlipidemia    Hypertension    Ischemic cardiomyopathy    a.) 02/2018 MV: EF 35%; b.) 08/2019 Echo: EF 50-55%; c.) 04/2020 Echo:  EF 30-35%; d.) TTE 02/09/2021: EF 50-55%.   Lymphedema    Migraine    Myocardial infarction (HCC)    Neuropathy    PAD (peripheral artery disease)    a. 04/2017 Aortoiliac duplex: R iliac dzs; b. 03/2020 LE Duplex: mild bilat atherosclerosis throughout. Patent vessels.   Pneumonia    Retinopathy    S/P CABG x 4    a.)  4v CABG: LIMA->LAD, SVG->RCA, SVG->OM1, SVG->D1   Sleep apnea    a.) does not require nocturnal PAP therapy  Statin intolerance    Stomach ulcer    T2DM (type 2 diabetes mellitus) (HCC)     Family History  Problem Relation Age of Onset   Diabetes Mother    Heart disease Father     Past Surgical History:  Procedure Laterality Date   bone graft surgery Bilateral    on both feet x 2   CAPD INSERTION N/A 11/13/2021   Procedure: LAPAROSCOPIC INSERTION CONTINUOUS AMBULATORY PERITONEAL DIALYSIS  (CAPD) CATHETER;  Surgeon: Jordis Laneta FALCON, MD;  Location: [facility] ORS;  Service: General;  Laterality: N/A;   CAPD REVISION N/A 01/10/2022   Procedure: LAPAROSCOPIC REVISION CONTINUOUS AMBULATORY PERITONEAL DIALYSIS  (CAPD) CATHETER;  Surgeon: Jordis Laneta FALCON, MD;  Location: [facility] ORS;  Service: General;  Laterality: N/A;   CARDIAC CATHETERIZATION  2013   S/p PCI    CARDIAC CATHETERIZATION  2018   S/p CABG   CATARACT EXTRACTION Bilateral    COLONOSCOPY     COLONOSCOPY WITH PROPOFOL  N/A 10/17/2022   Procedure: COLONOSCOPY WITH PROPOFOL ;  Surgeon: Jinny Carmine, MD;  Location: [facility] ENDOSCOPY;  Service: Endoscopy;  Laterality: N/A;   CORONARY ARTERY BYPASS GRAFT  2018   (LIMA-LAD,VG-RCA,VG-OM1,VG-D1)   ESOPHAGOGASTRODUODENOSCOPY (EGD) WITH PROPOFOL  N/A 04/04/2020   Procedure: ESOPHAGOGASTRODUODENOSCOPY (EGD) WITH PROPOFOL ;  Surgeon: Jinny Carmine, MD;  Location: [facility] ENDOSCOPY;  Service: Endoscopy;  Laterality: N/A;   ESOPHAGOGASTRODUODENOSCOPY (EGD) WITH PROPOFOL  N/A 06/06/2020   Procedure: ESOPHAGOGASTRODUODENOSCOPY (EGD) WITH PROPOFOL ;  Surgeon: Jinny Carmine, MD;  Location: [facility]  ENDOSCOPY;  Service: Endoscopy;  Laterality: N/A;   ESOPHAGOGASTRODUODENOSCOPY (EGD) WITH PROPOFOL  N/A 09/24/2022   Procedure: ESOPHAGOGASTRODUODENOSCOPY (EGD) WITH PROPOFOL ;  Surgeon: Jinny Carmine, MD;  Location: [facility] ENDOSCOPY;  Service: Endoscopy;  Laterality: N/A;   INCISION AND DRAINAGE OF WOUND Left 04/23/2024   Procedure: IRRIGATION AND DEBRIDEMENT WOUND;  Surgeon: Gust Molly, DO;  Location: [facility] ORS;  Service: Orthopedics;  Laterality: Left;  LEFT WRIST DEBRIDEMENT OF WOUND   PERIPHERAL VASCULAR CATHETERIZATION Right 10/28/2016   PTA/DEB Right SFA   WRIST SURGERY Left    cyst   Social History   Occupational History   Not on file  Tobacco Use   Smoking status: Every Day    Current packs/day: 0.25    Average packs/day: 0.3 packs/day for 39.0 years (9.8 ttl pk-yrs)    Types: Cigarettes    Passive exposure: Past   Smokeless tobacco: Never  Vaping Use   Vaping status: Never Used  Substance and Sexual Activity   Alcohol use: Not Currently    Comment: very rarely - wine   Drug use: Never   Sexual activity: Not Currently

## 2024-04-28 ENCOUNTER — Telehealth: Payer: Self-pay

## 2024-04-28 DIAGNOSIS — N250 Renal osteodystrophy: Secondary | ICD-10-CM | POA: Diagnosis not present

## 2024-04-28 DIAGNOSIS — N186 End stage renal disease: Secondary | ICD-10-CM | POA: Diagnosis not present

## 2024-04-28 DIAGNOSIS — Z992 Dependence on renal dialysis: Secondary | ICD-10-CM | POA: Diagnosis not present

## 2024-04-28 DIAGNOSIS — D649 Anemia, unspecified: Secondary | ICD-10-CM | POA: Diagnosis not present

## 2024-04-28 LAB — AEROBIC/ANAEROBIC CULTURE W GRAM STAIN (SURGICAL/DEEP WOUND)
Gram Stain: NONE SEEN
Gram Stain: NONE SEEN

## 2024-04-28 LAB — VAS US ABI WITH/WO TBI

## 2024-04-28 NOTE — Telephone Encounter (Signed)
 I talked to the pt this morning to cancel his appointment with Dr. Gust for Friday. He stated he was hoping someone in [place] can see him because he said he doesn't want to drive to [facility] for care. He would like a referral to someone local. Can one of you please advise

## 2024-04-28 NOTE — Telephone Encounter (Addendum)
 Pt wife advised as per alyssa that next time that happen she need call 911 and also not to take both med together also explain pt before by alyssa and also as per pt wife he is ok today it was happened in past advised next time call 911 ASAP and advised her for glucose please call his endocrinology and as per pt wife she already sent message

## 2024-04-29 ENCOUNTER — Encounter: Payer: Self-pay | Admitting: Infectious Diseases

## 2024-04-29 DIAGNOSIS — M4622 Osteomyelitis of vertebra, cervical region: Secondary | ICD-10-CM | POA: Diagnosis not present

## 2024-04-29 NOTE — Telephone Encounter (Signed)
 Canceled the appointment on 06/04/23 and advised the patient's wife the fluconazole  was sent in on 04/27/24 and to call the pharmacy.

## 2024-04-29 NOTE — Anesthesia Postprocedure Evaluation (Signed)
 Anesthesia Post Note  Patient: [name] [name]  Procedure(s) Performed: IRRIGATION AND DEBRIDEMENT WOUND (Left: Wrist)  Patient location during evaluation: PACU Anesthesia Type: General Level of consciousness: awake and alert Pain management: pain level controlled Vital Signs Assessment: post-procedure vital signs reviewed and stable Respiratory status: spontaneous breathing, nonlabored ventilation, respiratory function stable and patient connected to nasal cannula oxygen Cardiovascular status: blood pressure returned to baseline and stable Postop Assessment: no apparent nausea or vomiting Anesthetic complications: no   No notable events documented.   Last Vitals:  Vitals:   04/23/24 1715 04/23/24 1730  BP: 133/72 (!) 140/77  Pulse: 75 80  Resp: 16 20  Temp: (!) 36.4 C 36.6 C  SpO2: 96% 95%    Last Pain:  Vitals:   04/23/24 1730  TempSrc:   PainSc: 0-No pain                 Prentice Murphy

## 2024-04-30 ENCOUNTER — Encounter

## 2024-05-01 DIAGNOSIS — M4622 Osteomyelitis of vertebra, cervical region: Secondary | ICD-10-CM | POA: Diagnosis not present

## 2024-05-01 DIAGNOSIS — E118 Type 2 diabetes mellitus with unspecified complications: Secondary | ICD-10-CM | POA: Diagnosis not present

## 2024-05-01 DIAGNOSIS — Z794 Long term (current) use of insulin: Secondary | ICD-10-CM | POA: Diagnosis not present

## 2024-05-03 ENCOUNTER — Encounter: Payer: Self-pay | Admitting: Infectious Diseases

## 2024-05-04 DIAGNOSIS — E118 Type 2 diabetes mellitus with unspecified complications: Secondary | ICD-10-CM | POA: Diagnosis not present

## 2024-05-04 DIAGNOSIS — Z794 Long term (current) use of insulin: Secondary | ICD-10-CM | POA: Diagnosis not present

## 2024-05-04 NOTE — Telephone Encounter (Signed)
 I spoke with patient's wife and she reports they were not happy at Dr. Crist office and do not wish to be seen there. She stated they were never referred to wound care. I will try coordinate and get him schedule with wound care.  Linnet Bottari T Veva

## 2024-05-04 NOTE — Telephone Encounter (Signed)
 Patient wife returned call. Informed [name]. She stated she was aware. Patient isn't seeing wound care. Patient wife stated that she wasn't going back to Dr.Duda. informed her I would relay the message to Dr.Ravishankar.    [name] [name] [name] Letters, CMA

## 2024-05-05 ENCOUNTER — Telehealth: Payer: Self-pay

## 2024-05-05 ENCOUNTER — Other Ambulatory Visit: Payer: Self-pay

## 2024-05-05 DIAGNOSIS — L089 Local infection of the skin and subcutaneous tissue, unspecified: Secondary | ICD-10-CM

## 2024-05-05 LAB — FUNGUS CULTURE WITH STAIN

## 2024-05-05 LAB — FUNGAL ORGANISM REFLEX

## 2024-05-05 LAB — FUNGUS CULTURE RESULT

## 2024-05-05 NOTE — Telephone Encounter (Signed)
 I talked to the pt.at the request of infectious disease His wounds on his hands are getting worse. He doesn't want to drive to [place] again to see Dr Harden who per pt is just going to look at my hand and tell me nothing and do nothing for me . Per pt he thinks Dr gust should walk over to the wound clinic next to us  and get him in there to be seen. Please advise

## 2024-05-05 NOTE — Telephone Encounter (Signed)
 I reached out to Dr. Gust office for them to reach out to the patient to get him scheduled back with him. Patient will also see Dr. Epifanio next week.  [name] [name], CMA

## 2024-05-05 NOTE — Telephone Encounter (Signed)
 I put the referral as urgent. Samule can you make sure they get it. They told us  they dont use epic

## 2024-05-06 DIAGNOSIS — M4622 Osteomyelitis of vertebra, cervical region: Secondary | ICD-10-CM | POA: Diagnosis not present

## 2024-05-10 NOTE — Telephone Encounter (Signed)
 General 05/06/2024 11:17 AM Marcine Samule DASEN, CMA Referral faxed to [phone] -  Note: Referral faxed to [phone]

## 2024-05-11 ENCOUNTER — Encounter: Payer: Self-pay | Admitting: Infectious Diseases

## 2024-05-12 ENCOUNTER — Encounter: Attending: Internal Medicine | Admitting: Internal Medicine

## 2024-05-12 DIAGNOSIS — M4632 Infection of intervertebral disc (pyogenic), cervical region: Secondary | ICD-10-CM | POA: Diagnosis not present

## 2024-05-12 DIAGNOSIS — E11622 Type 2 diabetes mellitus with other skin ulcer: Secondary | ICD-10-CM | POA: Diagnosis present

## 2024-05-12 DIAGNOSIS — Z8619 Personal history of other infectious and parasitic diseases: Secondary | ICD-10-CM | POA: Insufficient documentation

## 2024-05-12 DIAGNOSIS — L98A318 Non-pressure chronic ulcer of right hand with other specified severity: Secondary | ICD-10-CM | POA: Diagnosis not present

## 2024-05-12 DIAGNOSIS — L97828 Non-pressure chronic ulcer of other part of left lower leg with other specified severity: Secondary | ICD-10-CM | POA: Insufficient documentation

## 2024-05-12 DIAGNOSIS — E1151 Type 2 diabetes mellitus with diabetic peripheral angiopathy without gangrene: Secondary | ICD-10-CM | POA: Insufficient documentation

## 2024-05-14 ENCOUNTER — Telehealth: Payer: Self-pay | Admitting: Nurse Practitioner

## 2024-05-14 NOTE — Telephone Encounter (Signed)
 Gastroenterology appointment 07/06/2024 with [facility]

## 2024-05-16 ENCOUNTER — Telehealth: Payer: Self-pay | Admitting: Infectious Diseases

## 2024-05-16 NOTE — Telephone Encounter (Signed)
 Gordy see my note 12/19 on him. Going to extend his IV abx since getting new wounds until he sees you.

## 2024-05-17 ENCOUNTER — Other Ambulatory Visit: Payer: Self-pay

## 2024-05-17 ENCOUNTER — Ambulatory Visit: Admitting: Nurse Practitioner

## 2024-05-17 DIAGNOSIS — E1040 Type 1 diabetes mellitus with diabetic neuropathy, unspecified: Secondary | ICD-10-CM | POA: Diagnosis present

## 2024-05-17 DIAGNOSIS — K219 Gastro-esophageal reflux disease without esophagitis: Secondary | ICD-10-CM | POA: Diagnosis present

## 2024-05-17 DIAGNOSIS — E1052 Type 1 diabetes mellitus with diabetic peripheral angiopathy with gangrene: Principal | ICD-10-CM | POA: Diagnosis present

## 2024-05-17 DIAGNOSIS — Z8616 Personal history of COVID-19: Secondary | ICD-10-CM

## 2024-05-17 DIAGNOSIS — G43909 Migraine, unspecified, not intractable, without status migrainosus: Secondary | ICD-10-CM | POA: Diagnosis present

## 2024-05-17 DIAGNOSIS — G4730 Sleep apnea, unspecified: Secondary | ICD-10-CM | POA: Diagnosis present

## 2024-05-17 DIAGNOSIS — R7881 Bacteremia: Secondary | ICD-10-CM | POA: Diagnosis present

## 2024-05-17 DIAGNOSIS — N2581 Secondary hyperparathyroidism of renal origin: Secondary | ICD-10-CM | POA: Diagnosis present

## 2024-05-17 DIAGNOSIS — L0889 Other specified local infections of the skin and subcutaneous tissue: Secondary | ICD-10-CM | POA: Diagnosis present

## 2024-05-17 DIAGNOSIS — E1065 Type 1 diabetes mellitus with hyperglycemia: Secondary | ICD-10-CM | POA: Diagnosis present

## 2024-05-17 DIAGNOSIS — Z951 Presence of aortocoronary bypass graft: Secondary | ICD-10-CM

## 2024-05-17 DIAGNOSIS — Z66 Do not resuscitate: Secondary | ICD-10-CM | POA: Diagnosis present

## 2024-05-17 DIAGNOSIS — Z8711 Personal history of peptic ulcer disease: Secondary | ICD-10-CM

## 2024-05-17 DIAGNOSIS — J449 Chronic obstructive pulmonary disease, unspecified: Secondary | ICD-10-CM | POA: Diagnosis present

## 2024-05-17 DIAGNOSIS — I132 Hypertensive heart and chronic kidney disease with heart failure and with stage 5 chronic kidney disease, or end stage renal disease: Secondary | ICD-10-CM | POA: Diagnosis present

## 2024-05-17 DIAGNOSIS — N186 End stage renal disease: Secondary | ICD-10-CM | POA: Diagnosis present

## 2024-05-17 DIAGNOSIS — F17210 Nicotine dependence, cigarettes, uncomplicated: Secondary | ICD-10-CM | POA: Diagnosis present

## 2024-05-17 DIAGNOSIS — I255 Ischemic cardiomyopathy: Secondary | ICD-10-CM | POA: Diagnosis present

## 2024-05-17 DIAGNOSIS — Z992 Dependence on renal dialysis: Secondary | ICD-10-CM

## 2024-05-17 DIAGNOSIS — Z91041 Radiographic dye allergy status: Secondary | ICD-10-CM

## 2024-05-17 DIAGNOSIS — I5022 Chronic systolic (congestive) heart failure: Secondary | ICD-10-CM | POA: Diagnosis present

## 2024-05-17 DIAGNOSIS — Z794 Long term (current) use of insulin: Secondary | ICD-10-CM

## 2024-05-17 DIAGNOSIS — F419 Anxiety disorder, unspecified: Secondary | ICD-10-CM | POA: Diagnosis present

## 2024-05-17 DIAGNOSIS — Z833 Family history of diabetes mellitus: Secondary | ICD-10-CM

## 2024-05-17 DIAGNOSIS — Z604 Social exclusion and rejection: Secondary | ICD-10-CM | POA: Diagnosis present

## 2024-05-17 DIAGNOSIS — I252 Old myocardial infarction: Secondary | ICD-10-CM

## 2024-05-17 DIAGNOSIS — Z79899 Other long term (current) drug therapy: Secondary | ICD-10-CM

## 2024-05-17 DIAGNOSIS — Z888 Allergy status to other drugs, medicaments and biological substances status: Secondary | ICD-10-CM

## 2024-05-17 DIAGNOSIS — I2510 Atherosclerotic heart disease of native coronary artery without angina pectoris: Secondary | ICD-10-CM | POA: Diagnosis present

## 2024-05-17 DIAGNOSIS — Z7982 Long term (current) use of aspirin: Secondary | ICD-10-CM

## 2024-05-17 DIAGNOSIS — F32A Depression, unspecified: Secondary | ICD-10-CM | POA: Diagnosis present

## 2024-05-17 DIAGNOSIS — E1022 Type 1 diabetes mellitus with diabetic chronic kidney disease: Secondary | ICD-10-CM | POA: Diagnosis present

## 2024-05-17 DIAGNOSIS — E785 Hyperlipidemia, unspecified: Secondary | ICD-10-CM | POA: Diagnosis present

## 2024-05-17 DIAGNOSIS — Z9861 Coronary angioplasty status: Secondary | ICD-10-CM

## 2024-05-17 DIAGNOSIS — Z860100 Personal history of colon polyps, unspecified: Secondary | ICD-10-CM

## 2024-05-17 DIAGNOSIS — L97219 Non-pressure chronic ulcer of right calf with unspecified severity: Secondary | ICD-10-CM | POA: Diagnosis present

## 2024-05-17 DIAGNOSIS — D631 Anemia in chronic kidney disease: Secondary | ICD-10-CM | POA: Diagnosis present

## 2024-05-17 DIAGNOSIS — E10319 Type 1 diabetes mellitus with unspecified diabetic retinopathy without macular edema: Secondary | ICD-10-CM | POA: Diagnosis present

## 2024-05-17 DIAGNOSIS — L03115 Cellulitis of right lower limb: Secondary | ICD-10-CM | POA: Diagnosis present

## 2024-05-17 DIAGNOSIS — E113513 Type 2 diabetes mellitus with proliferative diabetic retinopathy with macular edema, bilateral: Secondary | ICD-10-CM | POA: Diagnosis not present

## 2024-05-17 DIAGNOSIS — E1069 Type 1 diabetes mellitus with other specified complication: Secondary | ICD-10-CM | POA: Diagnosis present

## 2024-05-17 DIAGNOSIS — M4622 Osteomyelitis of vertebra, cervical region: Secondary | ICD-10-CM | POA: Diagnosis present

## 2024-05-17 DIAGNOSIS — Z8249 Family history of ischemic heart disease and other diseases of the circulatory system: Secondary | ICD-10-CM

## 2024-05-17 LAB — CBC WITH DIFFERENTIAL/PLATELET
Abs Immature Granulocytes: 0.04 K/uL (ref 0.00–0.07)
Basophils Absolute: 0.3 K/uL — ABNORMAL HIGH (ref 0.0–0.1)
Basophils Relative: 2 %
Eosinophils Absolute: 0.5 K/uL (ref 0.0–0.5)
Eosinophils Relative: 4 %
HCT: 39.9 % (ref 39.0–52.0)
Hemoglobin: 13.4 g/dL (ref 13.0–17.0)
Immature Granulocytes: 0 %
Lymphocytes Relative: 15 %
Lymphs Abs: 1.6 K/uL (ref 0.7–4.0)
MCH: 31.8 pg (ref 26.0–34.0)
MCHC: 33.6 g/dL (ref 30.0–36.0)
MCV: 94.8 fL (ref 80.0–100.0)
Monocytes Absolute: 1 K/uL (ref 0.1–1.0)
Monocytes Relative: 8 %
Neutro Abs: 7.9 K/uL — ABNORMAL HIGH (ref 1.7–7.7)
Neutrophils Relative %: 71 %
Platelets: 311 K/uL (ref 150–400)
RBC: 4.21 MIL/uL — ABNORMAL LOW (ref 4.22–5.81)
RDW: 15.3 % (ref 11.5–15.5)
WBC: 11.3 K/uL — ABNORMAL HIGH (ref 4.0–10.5)
nRBC: 0 % (ref 0.0–0.2)

## 2024-05-17 LAB — BASIC METABOLIC PANEL WITH GFR
Anion gap: 18 — ABNORMAL HIGH (ref 5–15)
BUN: 63 mg/dL — ABNORMAL HIGH (ref 8–23)
CO2: 27 mmol/L (ref 22–32)
Calcium: 10.1 mg/dL (ref 8.9–10.3)
Chloride: 89 mmol/L — ABNORMAL LOW (ref 98–111)
Creatinine, Ser: 6.03 mg/dL — ABNORMAL HIGH (ref 0.61–1.24)
GFR, Estimated: 10 mL/min — ABNORMAL LOW
Glucose, Bld: 207 mg/dL — ABNORMAL HIGH (ref 70–99)
Potassium: 3.5 mmol/L (ref 3.5–5.1)
Sodium: 133 mmol/L — ABNORMAL LOW (ref 135–145)

## 2024-05-17 LAB — LACTIC ACID, PLASMA: Lactic Acid, Venous: 1.2 mmol/L (ref 0.5–1.9)

## 2024-05-17 NOTE — ED Triage Notes (Signed)
 Pt reports lower right leg wound that began on 12/17, pt reports recurrent infections. Pt has osteomyelitis in his neck and is currently getting iv abx through PICC line.

## 2024-05-18 ENCOUNTER — Inpatient Hospital Stay
Admission: EM | Admit: 2024-05-18 | Discharge: 2024-05-19 | Disposition: A | Discharge status: Home / Self Care | Source: Ambulatory Visit | Attending: Obstetrics and Gynecology | Admitting: Obstetrics and Gynecology

## 2024-05-18 ENCOUNTER — Ambulatory Visit: Admitting: Nurse Practitioner

## 2024-05-18 DIAGNOSIS — I5020 Unspecified systolic (congestive) heart failure: Secondary | ICD-10-CM | POA: Insufficient documentation

## 2024-05-18 DIAGNOSIS — D631 Anemia in chronic kidney disease: Secondary | ICD-10-CM | POA: Diagnosis present

## 2024-05-18 DIAGNOSIS — T148XXA Other injury of unspecified body region, initial encounter: Secondary | ICD-10-CM | POA: Diagnosis not present

## 2024-05-18 DIAGNOSIS — Z66 Do not resuscitate: Secondary | ICD-10-CM | POA: Diagnosis present

## 2024-05-18 DIAGNOSIS — L03115 Cellulitis of right lower limb: Secondary | ICD-10-CM | POA: Diagnosis present

## 2024-05-18 DIAGNOSIS — L97919 Non-pressure chronic ulcer of unspecified part of right lower leg with unspecified severity: Secondary | ICD-10-CM

## 2024-05-18 DIAGNOSIS — N186 End stage renal disease: Secondary | ICD-10-CM | POA: Diagnosis present

## 2024-05-18 DIAGNOSIS — Z794 Long term (current) use of insulin: Secondary | ICD-10-CM | POA: Diagnosis not present

## 2024-05-18 DIAGNOSIS — E1022 Type 1 diabetes mellitus with diabetic chronic kidney disease: Secondary | ICD-10-CM | POA: Diagnosis present

## 2024-05-18 DIAGNOSIS — E1122 Type 2 diabetes mellitus with diabetic chronic kidney disease: Secondary | ICD-10-CM | POA: Diagnosis not present

## 2024-05-18 DIAGNOSIS — L089 Local infection of the skin and subcutaneous tissue, unspecified: Secondary | ICD-10-CM | POA: Diagnosis present

## 2024-05-18 DIAGNOSIS — S61402A Unspecified open wound of left hand, initial encounter: Secondary | ICD-10-CM

## 2024-05-18 DIAGNOSIS — J449 Chronic obstructive pulmonary disease, unspecified: Secondary | ICD-10-CM | POA: Diagnosis present

## 2024-05-18 DIAGNOSIS — Z792 Long term (current) use of antibiotics: Secondary | ICD-10-CM

## 2024-05-18 DIAGNOSIS — M4642 Discitis, unspecified, cervical region: Secondary | ICD-10-CM

## 2024-05-18 DIAGNOSIS — Z951 Presence of aortocoronary bypass graft: Secondary | ICD-10-CM

## 2024-05-18 DIAGNOSIS — F17210 Nicotine dependence, cigarettes, uncomplicated: Secondary | ICD-10-CM

## 2024-05-18 DIAGNOSIS — F418 Other specified anxiety disorders: Secondary | ICD-10-CM | POA: Diagnosis present

## 2024-05-18 DIAGNOSIS — N2581 Secondary hyperparathyroidism of renal origin: Secondary | ICD-10-CM | POA: Diagnosis present

## 2024-05-18 DIAGNOSIS — I70239 Atherosclerosis of native arteries of right leg with ulceration of unspecified site: Secondary | ICD-10-CM | POA: Diagnosis not present

## 2024-05-18 DIAGNOSIS — E785 Hyperlipidemia, unspecified: Secondary | ICD-10-CM | POA: Diagnosis present

## 2024-05-18 DIAGNOSIS — E1040 Type 1 diabetes mellitus with diabetic neuropathy, unspecified: Secondary | ICD-10-CM | POA: Diagnosis present

## 2024-05-18 DIAGNOSIS — Z992 Dependence on renal dialysis: Secondary | ICD-10-CM | POA: Diagnosis not present

## 2024-05-18 DIAGNOSIS — K219 Gastro-esophageal reflux disease without esophagitis: Secondary | ICD-10-CM | POA: Diagnosis present

## 2024-05-18 DIAGNOSIS — I5022 Chronic systolic (congestive) heart failure: Secondary | ICD-10-CM | POA: Diagnosis present

## 2024-05-18 DIAGNOSIS — G4730 Sleep apnea, unspecified: Secondary | ICD-10-CM | POA: Diagnosis present

## 2024-05-18 DIAGNOSIS — E118 Type 2 diabetes mellitus with unspecified complications: Secondary | ICD-10-CM

## 2024-05-18 DIAGNOSIS — L0889 Other specified local infections of the skin and subcutaneous tissue: Secondary | ICD-10-CM | POA: Diagnosis present

## 2024-05-18 DIAGNOSIS — I2510 Atherosclerotic heart disease of native coronary artery without angina pectoris: Secondary | ICD-10-CM | POA: Diagnosis present

## 2024-05-18 DIAGNOSIS — M4622 Osteomyelitis of vertebra, cervical region: Secondary | ICD-10-CM

## 2024-05-18 DIAGNOSIS — S81801A Unspecified open wound, right lower leg, initial encounter: Secondary | ICD-10-CM | POA: Diagnosis not present

## 2024-05-18 DIAGNOSIS — E1052 Type 1 diabetes mellitus with diabetic peripheral angiopathy with gangrene: Secondary | ICD-10-CM | POA: Diagnosis present

## 2024-05-18 DIAGNOSIS — F32A Depression, unspecified: Secondary | ICD-10-CM | POA: Diagnosis present

## 2024-05-18 DIAGNOSIS — R7881 Bacteremia: Principal | ICD-10-CM

## 2024-05-18 DIAGNOSIS — E10319 Type 1 diabetes mellitus with unspecified diabetic retinopathy without macular edema: Secondary | ICD-10-CM | POA: Diagnosis present

## 2024-05-18 DIAGNOSIS — E1151 Type 2 diabetes mellitus with diabetic peripheral angiopathy without gangrene: Secondary | ICD-10-CM | POA: Diagnosis not present

## 2024-05-18 DIAGNOSIS — I132 Hypertensive heart and chronic kidney disease with heart failure and with stage 5 chronic kidney disease, or end stage renal disease: Secondary | ICD-10-CM | POA: Diagnosis present

## 2024-05-18 DIAGNOSIS — Z7982 Long term (current) use of aspirin: Secondary | ICD-10-CM | POA: Diagnosis not present

## 2024-05-18 DIAGNOSIS — L97219 Non-pressure chronic ulcer of right calf with unspecified severity: Secondary | ICD-10-CM | POA: Diagnosis present

## 2024-05-18 DIAGNOSIS — S81801D Unspecified open wound, right lower leg, subsequent encounter: Secondary | ICD-10-CM

## 2024-05-18 DIAGNOSIS — Z79899 Other long term (current) drug therapy: Secondary | ICD-10-CM | POA: Diagnosis not present

## 2024-05-18 DIAGNOSIS — E1065 Type 1 diabetes mellitus with hyperglycemia: Secondary | ICD-10-CM | POA: Diagnosis present

## 2024-05-18 DIAGNOSIS — Z8616 Personal history of COVID-19: Secondary | ICD-10-CM | POA: Diagnosis not present

## 2024-05-18 DIAGNOSIS — I10 Essential (primary) hypertension: Secondary | ICD-10-CM | POA: Diagnosis present

## 2024-05-18 DIAGNOSIS — E1069 Type 1 diabetes mellitus with other specified complication: Secondary | ICD-10-CM | POA: Diagnosis present

## 2024-05-18 DIAGNOSIS — M79604 Pain in right leg: Secondary | ICD-10-CM | POA: Diagnosis present

## 2024-05-18 LAB — CBG MONITORING, ED
Glucose-Capillary: 191 mg/dL — ABNORMAL HIGH (ref 70–99)
Glucose-Capillary: 131 mg/dL — ABNORMAL HIGH (ref 70–99)

## 2024-05-18 LAB — CBC
HCT: 33.8 % — ABNORMAL LOW (ref 39.0–52.0)
Hemoglobin: 11.3 g/dL — ABNORMAL LOW (ref 13.0–17.0)
MCH: 31.7 pg (ref 26.0–34.0)
MCHC: 33.4 g/dL (ref 30.0–36.0)
MCV: 94.7 fL (ref 80.0–100.0)
Platelets: 268 K/uL (ref 150–400)
RBC: 3.57 MIL/uL — ABNORMAL LOW (ref 4.22–5.81)
RDW: 15.2 % (ref 11.5–15.5)
WBC: 12 K/uL — ABNORMAL HIGH (ref 4.0–10.5)
nRBC: 0 % (ref 0.0–0.2)

## 2024-05-18 LAB — GLUCOSE, CAPILLARY
Glucose-Capillary: 194 mg/dL — ABNORMAL HIGH (ref 70–99)
Glucose-Capillary: 251 mg/dL — ABNORMAL HIGH (ref 70–99)

## 2024-05-18 LAB — CREATININE, SERUM
Creatinine, Ser: 6.47 mg/dL — ABNORMAL HIGH (ref 0.61–1.24)
GFR, Estimated: 9 mL/min — ABNORMAL LOW

## 2024-05-18 LAB — BLOOD GAS, VENOUS
Acid-Base Excess: 2.5 mmol/L — ABNORMAL HIGH (ref 0.0–2.0)
Bicarbonate: 28.4 mmol/L — ABNORMAL HIGH (ref 20.0–28.0)
O2 Saturation: 65.1 %
Patient temperature: 37
pCO2, Ven: 48 mmHg (ref 44–60)
pH, Ven: 7.38 (ref 7.25–7.43)
pO2, Ven: 38 mmHg (ref 32–45)

## 2024-05-18 LAB — CK: Total CK: 112 U/L (ref 49–397)

## 2024-05-18 LAB — BETA-HYDROXYBUTYRIC ACID: Beta-Hydroxybutyric Acid: 0.31 mmol/L — ABNORMAL HIGH (ref 0.05–0.27)

## 2024-05-18 MED ORDER — EZETIMIBE 10 MG PO TABS
10.0000 mg | ORAL_TABLET | Freq: Every day | ORAL | Status: DC
  Administered 2024-05-18 – 2024-05-19 (×2): 10 mg via ORAL
  Filled 2024-05-18 (×2): qty 1

## 2024-05-18 MED ORDER — HEPARIN SODIUM (PORCINE) 5000 UNIT/ML IJ SOLN: 5000.0000 [IU] | Freq: Three times a day (TID) | INTRAMUSCULAR | Status: DC

## 2024-05-18 MED ORDER — INSULIN ASPART 100 UNIT/ML IJ SOLN: 0.0000 [IU] | Freq: Every day | INTRAMUSCULAR | Status: DC

## 2024-05-18 MED ORDER — FLUCONAZOLE 100 MG PO TABS
200.0000 mg | ORAL_TABLET | Freq: Every day | ORAL | Status: DC
  Administered 2024-05-18 – 2024-05-19 (×2): 200 mg via ORAL
  Filled 2024-05-18 (×2): qty 2

## 2024-05-18 MED ORDER — NYSTATIN 100000 UNIT/GM EX OINT
1.0000 | TOPICAL_OINTMENT | Freq: Every day | CUTANEOUS | Status: DC
  Filled 2024-05-18: qty 15

## 2024-05-18 MED ORDER — INSULIN ASPART 100 UNIT/ML IJ SOLN: 0.0000 [IU] | Freq: Three times a day (TID) | INTRAMUSCULAR | Status: DC

## 2024-05-18 MED ORDER — DAPTOMYCIN-SODIUM CHLORIDE 700-0.9 MG/100ML-% IV SOLN
700.0000 mg | INTRAVENOUS | Status: DC
  Filled 2024-05-18: qty 100

## 2024-05-18 MED ORDER — CARVEDILOL 6.25 MG PO TABS
3.1250 mg | ORAL_TABLET | Freq: Two times a day (BID) | ORAL | Status: DC
  Administered 2024-05-18 – 2024-05-19 (×3): 3.125 mg via ORAL
  Filled 2024-05-18 (×3): qty 1

## 2024-05-18 MED ORDER — INSULIN GLARGINE 100 UNIT/ML ~~LOC~~ SOLN
40.0000 [IU] | Freq: Every day | SUBCUTANEOUS | Status: DC
  Filled 2024-05-18 (×2): qty 0.4

## 2024-05-18 MED ORDER — CLONIDINE HCL 0.1 MG PO TABS: 0.1000 mg | ORAL_TABLET | Freq: Two times a day (BID) | ORAL | Status: DC | PRN

## 2024-05-18 MED ORDER — PANTOPRAZOLE SODIUM 40 MG PO TBEC
40.0000 mg | DELAYED_RELEASE_TABLET | Freq: Two times a day (BID) | ORAL | Status: DC
  Administered 2024-05-18 – 2024-05-19 (×3): 40 mg via ORAL
  Filled 2024-05-18 (×3): qty 1

## 2024-05-18 MED ORDER — BISACODYL 5 MG PO TBEC: 10.0000 mg | DELAYED_RELEASE_TABLET | Freq: Two times a day (BID) | ORAL | Status: DC | PRN

## 2024-05-18 MED ORDER — SODIUM CHLORIDE 0.9 % IV SOLN: 2.0000 g | INTRAVENOUS | Status: DC

## 2024-05-18 MED ORDER — OXYCODONE HCL 5 MG PO TABS
5.0000 mg | ORAL_TABLET | Freq: Two times a day (BID) | ORAL | Status: DC | PRN
  Administered 2024-05-18: 5 mg via ORAL
  Filled 2024-05-18: qty 1

## 2024-05-18 MED ORDER — ALBUTEROL SULFATE (2.5 MG/3ML) 0.083% IN NEBU: 3.0000 mL | INHALATION_SOLUTION | Freq: Four times a day (QID) | RESPIRATORY_TRACT | Status: DC | PRN

## 2024-05-18 MED ORDER — DAPTOMYCIN-SODIUM CHLORIDE 700-0.9 MG/100ML-% IV SOLN
700.0000 mg | INTRAVENOUS | Status: DC
  Filled 2024-05-18 (×2): qty 100

## 2024-05-18 MED ORDER — INSULIN GLARGINE 100 UNIT/ML ~~LOC~~ SOLN: 30.0000 [IU] | Freq: Every day | SUBCUTANEOUS | Status: DC

## 2024-05-18 MED ORDER — ISOSORBIDE MONONITRATE ER 30 MG PO TB24
30.0000 mg | ORAL_TABLET | Freq: Two times a day (BID) | ORAL | Status: DC
  Administered 2024-05-18 – 2024-05-19 (×3): 30 mg via ORAL
  Filled 2024-05-18 (×3): qty 1

## 2024-05-18 MED ORDER — SODIUM CHLORIDE 0.9 % IV BOLUS
1000.0000 mL | Freq: Once | INTRAVENOUS | Status: AC
  Administered 2024-05-18: 1000 mL via INTRAVENOUS

## 2024-05-18 MED ORDER — DAPTOMYCIN-SODIUM CHLORIDE 700-0.9 MG/100ML-% IV SOLN
700.0000 mg | Freq: Once | INTRAVENOUS | Status: DC
  Filled 2024-05-18: qty 100

## 2024-05-18 MED ORDER — DELFLEX-LC/1.5% DEXTROSE 344 MOSM/L IP SOLN
INTRAPERITONEAL | Status: DC
  Filled 2024-05-18: qty 3000

## 2024-05-18 MED ORDER — TORSEMIDE 20 MG PO TABS
20.0000 mg | ORAL_TABLET | Freq: Two times a day (BID) | ORAL | Status: DC
  Administered 2024-05-18 – 2024-05-19 (×3): 20 mg via ORAL
  Filled 2024-05-18 (×3): qty 1

## 2024-05-18 MED ORDER — ALPRAZOLAM 0.5 MG PO TABS: 0.5000 mg | ORAL_TABLET | Freq: Two times a day (BID) | ORAL | Status: DC | PRN

## 2024-05-18 MED ORDER — AMLODIPINE BESYLATE 10 MG PO TABS
10.0000 mg | ORAL_TABLET | Freq: Every day | ORAL | Status: DC
  Administered 2024-05-18 – 2024-05-19 (×2): 10 mg via ORAL
  Filled 2024-05-18: qty 1
  Filled 2024-05-18: qty 2

## 2024-05-18 MED ORDER — NORTRIPTYLINE HCL 25 MG PO CAPS
50.0000 mg | ORAL_CAPSULE | Freq: Every day | ORAL | Status: DC
  Administered 2024-05-18: 50 mg via ORAL
  Filled 2024-05-18: qty 2

## 2024-05-18 MED ORDER — GENTAMICIN SULFATE 0.1 % EX CREA
1.0000 | TOPICAL_CREAM | Freq: Every day | CUTANEOUS | Status: DC
  Administered 2024-05-19: 1 via TOPICAL
  Filled 2024-05-18 (×2): qty 15

## 2024-05-18 MED ORDER — ASPIRIN 81 MG PO TBEC
81.0000 mg | DELAYED_RELEASE_TABLET | Freq: Every day | ORAL | Status: DC
  Administered 2024-05-18 – 2024-05-19 (×2): 81 mg via ORAL
  Filled 2024-05-18 (×2): qty 1

## 2024-05-18 MED ORDER — SODIUM CHLORIDE 0.9 % IV SOLN
2.0000 g | INTRAVENOUS | Status: DC
  Administered 2024-05-18 – 2024-05-19 (×2): 2 g via INTRAVENOUS
  Filled 2024-05-18 (×2): qty 20

## 2024-05-18 NOTE — Plan of Care (Signed)

## 2024-05-18 NOTE — Progress Notes (Signed)
 Patient up to the bathroom with the walker. Wife at bedside.

## 2024-05-18 NOTE — Progress Notes (Signed)
 Pharmacy Antibiotic Note  [name] [name] is a 63 y.o. male admitted on 05/18/2024 with osteomyelitis and cervical spine discitis C4-C5. Patient has a history of recurrent discitis, first identified on MRI from 11/11. Currently, the patient is being treated with Daptomycin  700 mg IV q48h and Ceftriaxone  2 g IV q24h as outpatient therapy. They are now presenting with worsening wounds on the right lower extremity. ID is following the case and has recommended continuing Daptomycin  and Ceftriaxone  while inpatient. PMH significant for HTN, HLD, type I DM, CAD, ESRD on PD. Pharmacy has been consulted for Daptomycin  dosing. Pharmacy has been consulted for Daptomycin  dosing.  12/23: WBC 11.3, afebrile. Scr 6.03, which appears to be at baseline ~4-6. No new imaging so far from this admission.  Plan: - Will start Daptomycin  700 mg IV q48h per renal function and since on PD  - Will check CK level - Will continue Ceftriaxone  2 g IV q24h  - Will continue to monitor for signs of clinical improvement   Height: 6' 1 (185.4 cm) Weight: 74.1 kg (163 lb 5.8 oz) IBW/kg (Calculated) : 79.9  Temp (24hrs), Avg:97.9 F (36.6 C), Min:97.7 F (36.5 C), Max:98.2 F (36.8 C)  Recent Labs  Lab 05/17/24 2006  WBC 11.3*  CREATININE 6.03*  LATICACIDVEN 1.2    Estimated Creatinine Clearance: 12.8 mL/min (A) (by C-G formula based on SCr of 6.03 mg/dL (H)).    Allergies[1]  Antimicrobials this admission: Ceftriaxone  12/23 >> Daptomycin  12/23 >>  Dose adjustments this admission:   Microbiology results: 12/23 Bcx: NGTD  Thank you for allowing pharmacy to be a part of this patients care.   Ransom Blanch PGY-1 Pharmacy Resident  [place] [facility]  05/18/2024 9:57 AM      [1]  Allergies Allergen Reactions   Iodinated Contrast Media Other (See Comments)    Kidney disease  Kidney disease  Kidney disease  Kidney disease   Other Other (See Comments)    Pain  With joint stiffness     Statins  Other (See Comments)    Pain  With joint stiffness   Pain  With joint stiffness  Other Reaction(s): Joint Pain   Pregabalin Other (See Comments)    Generalized aches and pains Generalized aches and pains Generalized aches and pains Generalized aches and pains Generalized aches and pains   Sacubitril-Valsartan Other (See Comments)    Hyperkalemia  Hyperkalemia Hyperkalemia  Hyperkalemia   Atorvastatin Other (See Comments)    Muscle aches Muscle aches   Pravastatin Other (See Comments)    Muscle aches Muscle aches   Rosuvastatin Other (See Comments)    Muscle Aches Other reaction(s): JOINT PAIN Other reaction(s): JOINT PAIN Muscle Aches

## 2024-05-18 NOTE — ED Notes (Signed)
 NURSE PAM RN  INFORMED OF BED ASSIGNED

## 2024-05-18 NOTE — Consult Note (Signed)
 Infectious Disease     Reason for Consult: Wounds   Referring Physician: Roann Gouty, MD  Date of Admission:  05/18/2024   Principal Problem:   Wound infection Active Problems:   Essential hypertension   Depression with anxiety   Chronic leg and foot pain (Bilateral)   Diabetes mellitus with complication, with long-term current use of insulin  (HCC)   Hx of CABG   Gastroesophageal reflux disease without esophagitis   Osteomyelitis of cervical spine (HCC)   ESRD on peritoneal dialysis (HCC)   HLD (hyperlipidemia)   HPI: Lamari Beckles is a 63 y.o. male with a complicated history including end-stage renal disease on peritoneal and mild dialysis readmitted with worsening wounds on his legs and fingers.  He has been recently admitted and was on treatment for C-spine discitis with daptomycin  and ceftriaxone  with a planned stop date of December 25.  He was also on oral fluconazole  as he had a left hand wound that had undergone debridement and culture had grown Candida albicans.   When last seen he was having improvement in his neck pain in the left hand wound was dry and stable however he had developed a new wound on his right calf and was having issues with his new wounds in his hands.  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 He   was seen in the wound care clinic and they advised him to come to the emergency room.  It is quite painful.  On admission white count was 11.3.  He had no fevers.  Blood cultures were done.  Lactic acid was negative he has been restarted on daptomycin  and ceftriaxone . I had done a wound culture at [facility] clinic a few days ago and it is pending.  Past Medical History:  Diagnosis Date   Anemia    Arthritis    CAD (coronary artery disease)    a. 12/2016 s/p CABG x 4 (LIMA->LAD, VG->RCA, VG->OM1, VG->D1); b. 02/2018 MV: EF 35%, small-med inferolaterlal infarct. No ischemia.   CKD (chronic kidney disease), stage IV [facility])    Peritoneal Dialysis   Colon polyps     COPD (chronic obstructive pulmonary disease) (HCC)    Dupre's syndrome    GERD (gastroesophageal reflux disease)    HFrEF (heart failure reduced ejection fraction) (HCC)    a.) 2018 EF 35%; b.) 02/2018 EF 50%; c). 02/2019 EF 40-45%; d.) 08/2019 Echo: EF 50-55%, no rwma, GrII DD; e.) 04/2020 Echo: EF 30-35%, mod-sev glob HK. Mild LVH. G2DD. Low-nl RV fxn. Mildly dil LA. Mild MR. Mild-mod Ao sclerosis w/o stenosis; f.) TTE 02/09/2021: EF 55%; septal HK, LAE; G2DD.   History of 2019 novel coronavirus disease (COVID-19) 02/08/2021   Hyperlipidemia    Hypertension    Ischemic cardiomyopathy    a.) 02/2018 MV: EF 35%; b.) 08/2019 Echo: EF 50-55%; c.) 04/2020 Echo: EF 30-35%; d.) TTE 02/09/2021: EF 50-55%.   Lymphedema    Migraine    Myocardial infarction (HCC)    Neuropathy    PAD (peripheral artery disease)    a. 04/2017 Aortoiliac duplex: R iliac dzs; b. 03/2020 LE Duplex: mild bilat atherosclerosis throughout. Patent vessels.   Pneumonia    Retinopathy    S/P CABG x 4    a.)  4v CABG: LIMA->LAD, SVG->RCA, SVG->OM1, SVG->D1   Sleep apnea    a.) does not require nocturnal PAP therapy   Statin intolerance    Stomach ulcer    T2DM (type 2 diabetes mellitus) (HCC)    Past Surgical  History:  Procedure Laterality Date   bone graft surgery Bilateral    on both feet x 2   CAPD INSERTION N/A 11/13/2021   Procedure: LAPAROSCOPIC INSERTION CONTINUOUS AMBULATORY PERITONEAL DIALYSIS  (CAPD) CATHETER;  Surgeon: Jordis Laneta FALCON, MD;  Location: [facility] ORS;  Service: General;  Laterality: N/A;   CAPD REVISION N/A 01/10/2022   Procedure: LAPAROSCOPIC REVISION CONTINUOUS AMBULATORY PERITONEAL DIALYSIS  (CAPD) CATHETER;  Surgeon: Jordis Laneta FALCON, MD;  Location: [facility] ORS;  Service: General;  Laterality: N/A;   CARDIAC CATHETERIZATION  2013   S/p PCI    CARDIAC CATHETERIZATION  2018   S/p CABG   CATARACT EXTRACTION Bilateral    COLONOSCOPY     COLONOSCOPY WITH PROPOFOL  N/A 10/17/2022   Procedure:  COLONOSCOPY WITH PROPOFOL ;  Surgeon: Jinny Carmine, MD;  Location: [facility] ENDOSCOPY;  Service: Endoscopy;  Laterality: N/A;   CORONARY ARTERY BYPASS GRAFT  2018   (LIMA-LAD,VG-RCA,VG-OM1,VG-D1)   ESOPHAGOGASTRODUODENOSCOPY (EGD) WITH PROPOFOL  N/A 04/04/2020   Procedure: ESOPHAGOGASTRODUODENOSCOPY (EGD) WITH PROPOFOL ;  Surgeon: Jinny Carmine, MD;  Location: [facility] ENDOSCOPY;  Service: Endoscopy;  Laterality: N/A;   ESOPHAGOGASTRODUODENOSCOPY (EGD) WITH PROPOFOL  N/A 06/06/2020   Procedure: ESOPHAGOGASTRODUODENOSCOPY (EGD) WITH PROPOFOL ;  Surgeon: Jinny Carmine, MD;  Location: [facility] ENDOSCOPY;  Service: Endoscopy;  Laterality: N/A;   ESOPHAGOGASTRODUODENOSCOPY (EGD) WITH PROPOFOL  N/A 09/24/2022   Procedure: ESOPHAGOGASTRODUODENOSCOPY (EGD) WITH PROPOFOL ;  Surgeon: Jinny Carmine, MD;  Location: [facility] ENDOSCOPY;  Service: Endoscopy;  Laterality: N/A;   INCISION AND DRAINAGE OF WOUND Left 04/23/2024   Procedure: IRRIGATION AND DEBRIDEMENT WOUND;  Surgeon: Gust Molly, DO;  Location: [facility] ORS;  Service: Orthopedics;  Laterality: Left;  LEFT WRIST DEBRIDEMENT OF WOUND   PERIPHERAL VASCULAR CATHETERIZATION Right 10/28/2016   PTA/DEB Right SFA   WRIST SURGERY Left    cyst   Social History[1] Family History  Problem Relation Age of Onset   Diabetes Mother    Heart disease Father     Allergies: Allergies[2]  Current antibiotics: Antibiotics Given (last 72 hours)     Date/Time Action Medication Dose Rate   05/18/24 1140 New Bag/Given   cefTRIAXone  (ROCEPHIN ) 2 g in sodium chloride  0.9 % 100 mL IVPB 2 g 200 mL/hr       MEDICATIONS:  amLODipine   10 mg Oral Daily   aspirin  EC  81 mg Oral Daily   carvedilol   3.125 mg Oral BID WC   ezetimibe   10 mg Oral Daily   heparin   5,000 Units Subcutaneous Q8H   insulin  aspart  0-5 Units Subcutaneous QHS   insulin  aspart  0-6 Units Subcutaneous TID WC   insulin  glargine  40 Units Subcutaneous QHS   isosorbide  mononitrate  30 mg Oral BID   nortriptyline   50 mg  Oral QHS   nystatin  ointment  1 Application Topical Q1200   pantoprazole   40 mg Oral BID AC   torsemide   20 mg Oral BID    Review of Systems - 11 systems reviewed and negative per HPI   OBJECTIVE: Temp:  [97.7 F (36.5 C)-98.2 F (36.8 C)] 97.7 F (36.5 C) (12/23 0601) Pulse Rate:  [77-115] 83 (12/23 1100) Resp:  [10-26] 12 (12/23 1200) BP: (121-153)/(64-91) 136/64 (12/23 1200) SpO2:  [97 %-100 %] 97 % (12/23 1100) Weight:  [74.1 kg] 74.1 kg (12/22 1958) Physical Exam  Constitutional: frail, chronically ill appearing HENT: anicteric Mouth/Throat: Oropharynx is clear and moist. No oropharyngeal exudate.  Cardiovascular: Normal rate, regular rhythm and normal heart sounds. Pulmonary/Chest: Effort normal and breath sounds normal. No  respiratory distress. He has no wheezes.  Abdominal: Soft. Bowel sounds are normal. He exhibits no distension. PD cath in place Neurological: He is alert and oriented to person, place, and time.  Skin: see photo R post leg. L hand wounds   Psychiatric: He has a normal mood and affect. His behavior is normal.     LABS: Results for orders placed or performed during the hospital encounter of 05/18/24 (from the past 48 hours)  CBC with Differential     Status: Abnormal   Collection Time: 05/17/24  8:06 PM  Result Value Ref Range   WBC 11.3 (H) 4.0 - 10.5 K/uL   RBC 4.21 (L) 4.22 - 5.81 MIL/uL   Hemoglobin 13.4 13.0 - 17.0 g/dL   HCT 60.0 60.9 - 47.9 %   MCV 94.8 80.0 - 100.0 fL   MCH 31.8 26.0 - 34.0 pg   MCHC 33.6 30.0 - 36.0 g/dL   RDW 84.6 88.4 - 84.4 %   Platelets 311 150 - 400 K/uL   nRBC 0.0 0.0 - 0.2 %   Neutrophils Relative % 71 %   Neutro Abs 7.9 (H) 1.7 - 7.7 K/uL   Lymphocytes Relative 15 %   Lymphs Abs 1.6 0.7 - 4.0 K/uL   Monocytes Relative 8 %   Monocytes Absolute 1.0 0.1 - 1.0 K/uL   Eosinophils Relative 4 %   Eosinophils Absolute 0.5 0.0 - 0.5 K/uL   Basophils Relative 2 %   Basophils Absolute 0.3 (H) 0.0 - 0.1 K/uL    Immature Granulocytes 0 %   Abs Immature Granulocytes 0.04 0.00 - 0.07 K/uL    Comment: Performed at [facility], [address] [place], [place] [address]  Basic metabolic panel     Status: Abnormal   Collection Time: 05/17/24  8:06 PM  Result Value Ref Range   Sodium 133 (L) 135 - 145 mmol/L   Potassium 3.5 3.5 - 5.1 mmol/L   Chloride 89 (L) 98 - 111 mmol/L   CO2 27 22 - 32 mmol/L   Glucose, Bld 207 (H) 70 - 99 mg/dL    Comment: Glucose reference range applies only to samples taken after fasting for at least 8 hours.   BUN 63 (H) 8 - 23 mg/dL   Creatinine, Ser 3.96 (H) 0.61 - 1.24 mg/dL   Calcium  10.1 8.9 - 10.3 mg/dL   GFR, Estimated 10 (L) >60 mL/min    Comment: (NOTE) Calculated using the CKD-EPI Creatinine Equation (2021)    Anion gap 18 (H) 5 - 15    Comment: Performed at [facility], [address] [place], [place] [address]  Lactic acid, plasma     Status: None   Collection Time: 05/17/24  8:06 PM  Result Value Ref Range   Lactic Acid, Venous 1.2 0.5 - 1.9 mmol/L    Comment: Performed at [facility], [address] [place], [place] [address]  Blood culture (single)     Status: None (Preliminary result)   Collection Time: 05/17/24  8:06 PM   Specimen: BLOOD  Result Value Ref Range   Specimen Description BLOOD RIGHT ANTECUBITAL    Special Requests      BOTTLES DRAWN AEROBIC AND ANAEROBIC Blood Culture adequate volume   Culture      NO GROWTH < 12 HOURS Performed at [facility], [address], [place], [place] [address]    Report Status PENDING   Blood gas, venous     Status: Abnormal  Collection Time: 05/18/24 12:49 AM  Result Value Ref Range   pH, Ven 7.38 7.25 - 7.43   pCO2, Ven 48 44 - 60 mmHg   pO2, Ven 38 32 - 45 mmHg   Bicarbonate 28.4 (H) 20.0 - 28.0 mmol/L   Acid-Base Excess 2.5 (H) 0.0 - 2.0 mmol/L   O2 Saturation 65.1 %   Patient temperature 37.0    Collection site VEIN     Comment: Performed at  [facility], [address], [place], [place] [address]  Beta-hydroxybutyric acid     Status: Abnormal   Collection Time: 05/18/24 12:49 AM  Result Value Ref Range   Beta-Hydroxybutyric Acid 0.31 (H) 0.05 - 0.27 mmol/L    Comment: (NOTE) This is a modified FDA-approved test that has been validated and its performance characteristics determined by the reporting laboratory. This laboratory is certified under the Clinical Laboratory Improvement Amendments (CLIA) as qualified to perform high complexity clinical laboratory testing.  Performed at [facility], [address] [place], [place] [address]   CBG monitoring, ED     Status: Abnormal   Collection Time: 05/18/24  1:18 AM  Result Value Ref Range   Glucose-Capillary 191 (H) 70 - 99 mg/dL    Comment: Glucose reference range applies only to samples taken after fasting for at least 8 hours.  Blood culture (routine x 2)     Status: None (Preliminary result)   Collection Time: 05/18/24  1:42 AM   Specimen: BLOOD  Result Value Ref Range   Specimen Description BLOOD BLOOD LEFT ARM    Special Requests      BOTTLES DRAWN AEROBIC AND ANAEROBIC Blood Culture adequate volume   Culture      NO GROWTH < 12 HOURS Performed at [facility], [address] [place], [place] [address]    Report Status PENDING   CBG monitoring, ED     Status: Abnormal   Collection Time: 05/18/24  8:44 AM  Result Value Ref Range   Glucose-Capillary 131 (H) 70 - 99 mg/dL    Comment: Glucose reference range applies only to samples taken after fasting for at least 8 hours.   Comment 1 Notify RN    Comment 2 Document in Chart    No components found for: ESR, C REACTIVE PROTEIN MICRO: Recent Results (from the past 720 hours)  Fungus Culture With Stain     Status: Abnormal   Collection Time: 04/23/24  4:07 PM   Specimen: Path Tissue  Result Value Ref Range Status   Fungus Stain Final report (A)  Final   Fungus (Mycology)  Culture Preliminary report (A)  Final    Comment: (NOTE) Reported to [facility] on 05/04/2024 at 19:00. Faxed to [phone]. RBV JH. Performed At: [facility] [address] [place], [place] [id number] Jennette Shorter MD Ey:[id number]    Fungal Source WRIST  Final    Comment: Performed at [facility], [address] [place], [place] [address]  Aerobic/Anaerobic Culture w Gram Stain (surgical/deep wound)     Status: None   Collection Time: 04/23/24  4:07 PM   Specimen: Path Tissue  Result Value Ref Range Status   Specimen Description   Final    TISSUE Performed at [facility], [address] [place], [place] [address]    Special Requests LEFT WRIST WD SUPERFICIAL SPEC C  Final   Gram Stain NO WBC SEEN FEW YEAST   Final   Culture   Final  FEW CANDIDA ALBICANS NO ANAEROBES ISOLATED Performed at [facility], [address], [place], [place] [address]    Report Status 04/28/2024 FINAL  Final  Acid Fast Smear (AFB)     Status: None   Collection Time: 04/23/24  4:07 PM   Specimen: Path Tissue  Result Value Ref Range Status   AFB Specimen Processing Comment  Final    Comment: Tissue Grinding and Digestion/Decontamination   Acid Fast Smear Negative  Final    Comment: (NOTE) Performed At: [facility] [address] [place], [place] [id number] Jennette Shorter MD Ey:[id number]    Source (AFB) LEFT WRIST  Final    Comment: Performed at [facility], [address] [place], [place] [address]  Fungus Culture Result     Status: Abnormal   Collection Time: 04/23/24  4:07 PM  Result Value Ref Range Status   Result 1 Yeast observed (A)  Final    Comment: (NOTE) Performed At: [facility] [address] [place], [place] [id number] Jennette Shorter MD Ey:[id number]   Fungal organism reflex     Status: Abnormal   Collection Time: 04/23/24  4:07 PM  Result Value Ref Range Status   Fungal result 1 Candida albicans (A)   Corrected    Comment: (NOTE) Moderate growth Performed At: [facility] [address] [place], [place] [id number] Jennette Shorter MD Ey:[id number] CORRECTED ON 12/10 AT 1436: PREVIOUSLY REPORTED AS Comment   Fungus Culture With Stain     Status: Abnormal   Collection Time: 04/23/24  4:14 PM   Specimen: Path Tissue  Result Value Ref Range Status   Fungus Stain Final report  Final   Fungus (Mycology) Culture Preliminary report (A)  Final    Comment: (NOTE) Reported to [facility] on 05/04/2024 at 19:00. Faxed to [phone]. RBV JH. Performed At: [facility] [address] [place], [place] [id number] Jennette Shorter MD Ey:[id number]    Fungal Source TISSUE  Final    Comment: LEFT VM FAYETTE COS [id number] [facility] Performed at [facility], [address] [place], [place] [address]   Aerobic/Anaerobic Culture w Gram Stain (surgical/deep wound)     Status: None   Collection Time: 04/23/24  4:14 PM   Specimen: Path Tissue  Result Value Ref Range Status   Specimen Description   Final    TISSUE Performed at [facility], [address], [place], [place] [address]    Special Requests LEFT WRIST WD DEEP SPEC A  Final   Gram Stain NO WBC SEEN NO ORGANISMS SEEN   Final   Culture   Final    RARE CANDIDA ALBICANS NO ANAEROBES ISOLATED Performed at [facility], [address] [place], [place] [address]    Report Status 04/28/2024 FINAL  Final  Acid Fast Smear (AFB)     Status: None   Collection Time: 04/23/24  4:14 PM   Specimen: Path Tissue  Result Value Ref Range Status   AFB Specimen Processing Comment  Final    Comment: Tissue Grinding and Digestion/Decontamination   Acid Fast Smear Negative  Final    Comment: (NOTE) Performed At: [facility] [address] [place], [place] [id number] Jennette Shorter MD Ey:[id number]    Source (AFB) TISSUE  Final    Comment: Performed at [facility], [address],  [place], [place] [address]  Fungus Culture Result     Status: None   Collection Time: 04/23/24  4:14 PM  Result Value Ref  Range Status   Result 1 Comment  Final    Comment: (NOTE) KOH/Calcofluor preparation:  no fungus observed. Performed At: [facility] [address] [place], [place] [id number] Jennette Shorter MD Ey:[id number]   Fungal organism reflex     Status: Abnormal   Collection Time: 04/23/24  4:14 PM  Result Value Ref Range Status   Fungal result 1 Candida albicans (A)  Corrected    Comment: (NOTE) Light growth Performed At: [facility] [address] [place], [place] [id number] Jennette Shorter MD Ey:[id number] CORRECTED ON 12/10 AT 1436: PREVIOUSLY REPORTED AS Comment   Blood culture (single)     Status: None (Preliminary result)   Collection Time: 05/17/24  8:06 PM   Specimen: BLOOD  Result Value Ref Range Status   Specimen Description BLOOD RIGHT ANTECUBITAL  Final   Special Requests   Final    BOTTLES DRAWN AEROBIC AND ANAEROBIC Blood Culture adequate volume   Culture   Final    NO GROWTH < 12 HOURS Performed at [facility], [address] [place], [place] [address]    Report Status PENDING  Incomplete  Blood culture (routine x 2)     Status: None (Preliminary result)   Collection Time: 05/18/24  1:42 AM   Specimen: BLOOD  Result Value Ref Range Status   Specimen Description BLOOD BLOOD LEFT ARM  Final   Special Requests   Final    BOTTLES DRAWN AEROBIC AND ANAEROBIC Blood Culture adequate volume   Culture   Final    NO GROWTH < 12 HOURS Performed at [facility], [address] [place], [place] [address]    Report Status PENDING  Incomplete    IMAGING: VAS US  ABI WITH/WO TBI Result Date: 04/28/2024  LOWER EXTREMITY DOPPLER STUDY Patient Name:  Yitzchak Kothari  Date of Exam:   04/27/2024 Medical Rec #: [id number]        Accession #:    [id number] Date of Birth: 05/06/1961       Patient Gender: M Patient Age:   63 years  Exam Location:  [facility] Procedure:      VAS US  ABI WITH/WO TBI Referring Phys: SELINDA DEW --------------------------------------------------------------------------------  Indications: Peripheral artery disease. High Risk Factors: Hypertension, current smoker, coronary artery disease.  Performing Technologist: Donnice Charnley RVT  Examination Guidelines: A complete evaluation includes at minimum, Doppler waveform signals and systolic blood pressure reading at the level of bilateral brachial, anterior tibial, and posterior tibial arteries, when vessel segments are accessible. Bilateral testing is considered an integral part of a complete examination. Photoelectric Plethysmograph (PPG) waveforms and toe systolic pressure readings are included as required and additional duplex testing as needed. Limited examinations for reoccurring indications may be performed as noted.  ABI Findings: +---------+------------------+-----+----------+--------+ Right    Rt Pressure (mmHg)IndexWaveform  Comment  +---------+------------------+-----+----------+--------+ Brachial 146                                       +---------+------------------+-----+----------+--------+ PTA      250               1.71 monophasic         +---------+------------------+-----+----------+--------+ DP       250               1.71 biphasic           +---------+------------------+-----+----------+--------+ Burnetta Shackle  0.63                    +---------+------------------+-----+----------+--------+ +---------+------------------+-----+----------+-------+ Left     Lt Pressure (mmHg)IndexWaveform  Comment +---------+------------------+-----+----------+-------+ PTA      250               1.71 monophasic        +---------+------------------+-----+----------+-------+ DP       250               1.71 biphasic          +---------+------------------+-----+----------+-------+ Great Toe97                 0.66                   +---------+------------------+-----+----------+-------+ +-------+----------------+-----------+----------------+------------+ ABI/TBIToday's ABI     Today's TBIPrevious ABI    Previous TBI +-------+----------------+-----------+----------------+------------+ Right  Non compressible0.63       Non compressible0.87         +-------+----------------+-----------+----------------+------------+ Left   Non compressible0.66       Non compressible0.70         +-------+----------------+-----------+----------------+------------+  Arterial wall calcification precludes accurate ankle pressures and ABIs. Bilateral ABIs appear essentially unchanged compared to prior study on 10/28/2023.  Summary: Right: Resting right ankle-brachial index indicates noncompressible right lower extremity arteries. The right toe-brachial index is abnormal.  Left: Resting left ankle-brachial index indicates noncompressible left lower extremity arteries. The left toe-brachial index is abnormal.  *See table(s) above for measurements and observations.  Electronically signed by Selinda Gu MD on 04/28/2024 at 12:41:09 PM.    Final     Assessment:   Jospeh Mangel is a 63 y.o. male with a complicated history including end-stage renal disease on peritoneal and mild dialysis readmitted with worsening wounds on his legs and fingers.  He has been recently admitted and was on treatment for C-spine discitis with daptomycin  and ceftriaxone  with a planned stop date of December 25.  He was also on oral fluconazole  as he had a left hand wound that had undergone debridement and culture had grown Candida albicans.   When last seen he was having improvement in his neck pain in the left hand wound was dry and stable however he had developed a new wound on his right calf and was having issues with his new wounds in his hands.  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  Recommendations Leg wound and hand wounds-he  has been seen by vascular renal myself and wound care.  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  Currently on peritoneal dialysis and is on a phosphate binder.  He wants to discussed with renal converting to home hemodialysis.  Wound culture was done and leg several days ago at [facility] clinic but is pending.  He is already on daptomycin  and ceftriaxone  for C-spine infection.  Wound does not appear overtly infected currently.  C-spine discitis osteomyelitis-he is due to finish treatment in the next few days.  Will continue this for now and may extend another week given the wounds.  He will follow-up with Dr. Fayette when she returns and can plan to stop the antibiotics.  Left hand wound status postdebridement with culture with fluconazole .  He is finishing a 4-week course.  The wound is very dry and not infected appearing.  He can finish the 4 weeks and stop the fluconazole . Thank you very much for allowing me to participate in the care of this patient. Please call with questions.  Alm SQUIBB. Epifanio, MD       [1]  Social History Tobacco Use   Smoking status: Every Day    Current packs/day: 0.25    Average packs/day: 0.3 packs/day for 39.0 years (9.8 ttl pk-yrs)    Types: Cigarettes    Passive exposure: Past   Smokeless tobacco: Never  Vaping Use   Vaping status: Never Used  Substance Use Topics   Alcohol use: Not Currently    Comment: very rarely - wine   Drug use: Never  [2]  Allergies Allergen Reactions   Iodinated Contrast Media Other (See Comments)    Kidney disease  Kidney disease  Kidney disease  Kidney disease   Other Other (See Comments)    Pain  With joint stiffness     Statins Other (See Comments)    Pain  With joint stiffness   Pain  With joint stiffness  Other Reaction(s): Joint Pain   Pregabalin Other (See Comments)    Generalized aches and pains Generalized aches and pains Generalized aches and pains Generalized aches and  pains Generalized aches and pains   Sacubitril-Valsartan Other (See Comments)    Hyperkalemia  Hyperkalemia Hyperkalemia  Hyperkalemia   Atorvastatin Other (See Comments)    Muscle aches Muscle aches   Pravastatin Other (See Comments)    Muscle aches Muscle aches   Rosuvastatin Other (See Comments)    Muscle Aches Other reaction(s): JOINT PAIN Other reaction(s): JOINT PAIN Muscle Aches

## 2024-05-18 NOTE — Consult Note (Addendum)
 WOC Nurse Consult Note: Reason for Consult: Requested to assess wound in right leg. Wound type: Partial thickness - wound leg infection. Addendum: systemic antibiotics and antifungal med, neuropathy and PAD. Pressure Injury POA: NA Measurement: 14.5 cm x 5 cm x 0.1 cm Wound bed: 20% inviable tissue (brown and yellow), 80% red. Drainage (amount, consistency, odor) Scant amount, serous, no odor. Periwound: redness, dry skin, no edema. Dressing procedure/placement/frequency: 1 - Cleanse with Vashe [id number]. Not rinse and allow to dry.  2 - Moist the intact skin with barrier cream. 3 - Apply Xeroform to the wound bed, top with non adherent gauze.  4 - Wrap with stretch bandage or Kerlix. Change daily.  WOC team will not plan to follow further. Please reconsult if further assistance is needed. Thank-you,  Lela Holm MSN, RN, CWCN, CNS.  (Phone [phone])
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
## 2024-05-18 NOTE — Progress Notes (Signed)
 Patient said he is not taking any insulin  from the hospital because it is too expensive.

## 2024-05-18 NOTE — Progress Notes (Signed)
 Patient says he does have his insulin  with him and he will not take the insulin  here and if no one likes it they can kiss his ass. Tried to send medication to pharmacy for verification and patient is refusing.

## 2024-05-18 NOTE — H&P (Signed)
 "  History and Physical    [name] [name] FMW:[id number] DOB: 09-Oct-1960 DOA: 05/18/2024  DOS: the patient was seen and examined on 05/18/2024  PCP: [name] Fish, NP   Patient coming from: Home  I have personally briefly reviewed patient's old medical records in [facility]  Chief Complaint: Right leg infection  HPI: [name] [name] is a pleasant 63 y.o. 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  Patient stated that he has a right leg wound began approximately 4 days ago which has progressively gotten worse.  He went to wound clinicwhere he was advised to go to the emergency room as the wound is bad and needs emergent evaluation.  He reports compliance with all the medications prescribed. He complains of severe pain 8/10 in intensity but does not want more than his prescribed medication oxycodone , getting worse to the point that he is not able to handle that at home, denies any fever, chills, nausea, vomiting, diarrhea, abdominal pain, chest pain, shortness of breath, palpitations.  ED Course: Upon arrival to the ED, patient is found to be severe wound infection, cellulitis and some necrotic area in the back of the right leg up to the heel.  His white count was 11.3, blood glucose was 207 bicarbonate was 28.4, beta-hydroxybutyrate 0.31, blood glucose was 191.  Patient was given blood cultures.  He was given insulin  sliding scale.  Hospitalist service was consulted for evaluation for admission.  Review of Systems:  ROS  All other systems negative except as noted in the HPI.  Past Medical History:  Diagnosis Date   Anemia    Arthritis    CAD (coronary artery disease)    a. 12/2016 s/p CABG x 4 (LIMA->LAD, VG->RCA, VG->OM1, VG->D1); b.  02/2018 MV: EF 35%, small-med inferolaterlal infarct. No ischemia.   CKD (chronic kidney disease), stage IV [facility])    Peritoneal Dialysis   Colon polyps    COPD (chronic obstructive pulmonary disease) (HCC)    Dupre's syndrome    GERD (gastroesophageal reflux disease)    HFrEF (heart failure reduced ejection fraction) (HCC)    a.) 2018 EF 35%; b.) 02/2018 EF 50%; c). 02/2019 EF 40-45%; d.) 08/2019 Echo: EF 50-55%, no rwma, GrII DD; e.) 04/2020 Echo: EF 30-35%, mod-sev glob HK. Mild LVH. G2DD. Low-nl RV fxn. Mildly dil LA. Mild MR. Mild-mod Ao sclerosis w/o stenosis; f.) TTE 02/09/2021: EF 55%; septal HK, LAE; G2DD.   History of 2019 novel coronavirus disease (COVID-19) 02/08/2021   Hyperlipidemia    Hypertension    Ischemic cardiomyopathy    a.) 02/2018 MV: EF 35%; b.) 08/2019 Echo: EF 50-55%; c.) 04/2020 Echo: EF 30-35%; d.) TTE 02/09/2021: EF 50-55%.   Lymphedema    Migraine    Myocardial infarction (HCC)    Neuropathy    PAD (peripheral artery disease)    a. 04/2017 Aortoiliac duplex: R iliac dzs; b. 03/2020 LE Duplex: mild bilat atherosclerosis throughout. Patent vessels.   Pneumonia    Retinopathy    S/P CABG x 4    a.)  4v CABG: LIMA->LAD, SVG->RCA, SVG->OM1, SVG->D1   Sleep apnea    a.) does not require nocturnal PAP therapy   Statin intolerance    Stomach ulcer    T2DM (type 2 diabetes mellitus) (HCC)  Past Surgical History:  Procedure Laterality Date   bone graft surgery Bilateral    on both feet x 2   CAPD INSERTION N/A 11/13/2021   Procedure: LAPAROSCOPIC INSERTION CONTINUOUS AMBULATORY PERITONEAL DIALYSIS  (CAPD) CATHETER;  Surgeon: [name] Laneta FALCON, MD;  Location: [facility] ORS;  Service: General;  Laterality: N/A;   CAPD REVISION N/A 01/10/2022   Procedure: LAPAROSCOPIC REVISION CONTINUOUS AMBULATORY PERITONEAL DIALYSIS  (CAPD) CATHETER;  Surgeon: [name] Laneta FALCON, MD;  Location: [facility] ORS;  Service: General;  Laterality: N/A;   CARDIAC CATHETERIZATION  2013   S/p PCI     CARDIAC CATHETERIZATION  2018   S/p CABG   CATARACT EXTRACTION Bilateral    COLONOSCOPY     COLONOSCOPY WITH PROPOFOL  N/A 10/17/2022   Procedure: COLONOSCOPY WITH PROPOFOL ;  Surgeon: [name] Carmine, MD;  Location: [facility] END[facility]COPY;  Service: Endoscopy;  Laterality: N/A;   CORONARY ARTERY BYPASS GRAFT  2018   (LIMA-LAD,VG-RCA,VG-OM1,VG-D1)   ESOPHAGOGASTRODUODENOSCOPY (EGD) WITH PROPOFOL  N/A 04/04/2020   Procedure: ESOPHAGOGASTRODUODENOSCOPY (EGD) WITH PROPOFOL ;  Surgeon: [name] Carmine, MD;  Location: [facility] END[facility]COPY;  Service: Endoscopy;  Laterality: N/A;   ESOPHAGOGASTRODUODENOSCOPY (EGD) WITH PROPOFOL  N/A 06/06/2020   Procedure: ESOPHAGOGASTRODUODENOSCOPY (EGD) WITH PROPOFOL ;  Surgeon: [name] Carmine, MD;  Location: [facility];  Service: Endoscopy;  Laterality: N/A;   ESOPHAGOGASTRODUODENOSCOPY (EGD) WITH PROPOFOL  N/A 09/24/2022   Procedure: ESOPHAGOGASTRODUODENOSCOPY (EGD) WITH PROPOFOL ;  Surgeon: [name] Carmine, MD;  Location: [facility] END[facility];  Service: Endoscopy;  Laterality: N/A;   INCISION AND DRAINAGE OF WOUND Left 04/23/2024   Procedure: IRRIGATION AND DEBRIDEMENT WOUND;  Surgeon: [name] Molly, DO;  Location: [facility] ORS;  Service: Orthopedics;  Laterality: Left;  LEFT WRIST DEBRIDEMENT OF WOUND   PERIPHERAL VASCULAR CATHETERIZATION Right 10/28/2016   PTA/DEB Right SFA   WRIST SURGERY Left    cyst     reports that he has been smoking cigarettes. He has a 9.8 pack-year smoking history. He has been exposed to tobacco smoke. He has never used smokeless tobacco. He reports that he does not currently use alcohol. He reports that he does not use drugs.  Allergies[1]  Family History  Problem Relation Age of Onset   Diabetes Mother    Heart disease Father     Prior to Admission medications  Medication Sig Start Date End Date Taking? Authorizing Provider  albuterol  (VENTOLIN  HFA) 108 (90 Base) MCG/ACT inhaler Inhale 1-2 puffs into the lungs every 6 (six) hours as needed for wheezing or  shortness of breath. 04/15/23  Yes Abernathy, Mardy, NP  ALPRAZolam  (XANAX ) 0.5 MG tablet Take 1 tablet (0.5 mg total) by mouth 2 (two) times daily as needed for anxiety. 04/20/24  Yes Abernathy, Mardy, NP  amLODipine  (NORVASC ) 10 MG tablet Take 1 tablet (10 mg total) by mouth daily. 02/16/24  Yes Gollan, Timothy J, MD  aspirin  EC 81 MG tablet Take 81 mg by mouth daily. Swallow whole.   Yes [provider]  bisacodyl  (DULCOLAX) 5 MG EC tablet Take 10 mg by mouth 2 (two) times daily as needed for moderate constipation.   Yes [provider]  carvedilol  (COREG ) 3.125 MG tablet Take 1 tablet (3.125 mg total) by mouth 2 (two) times daily with a meal. 02/16/24  Yes Gollan, Timothy J, MD  cefTRIAXone  (ROCEPHIN ) IVPB Inject 2 g into the vein daily. Indication:  Cervical spine discitis First Dose: Yes Last Day of Therapy:  05/20/2024 Labs - Once weekly:  CBC/D, CMP, CPK, ESR and CRP Fax weekly lab results  promptly to [phone]  Method of administration: IV Push Method of administration may be changed at the discretion of home infusion pharmacist based upon assessment of the patient and/or caregiver's ability to self-administer the medication ordered. Radiology to remove central line after completion of antibiotic Call [phone] with any questions or critical value 04/11/24 05/20/24 Yes Laurita Pillion, MD  cholecalciferol  (VITAMIN D3) 25 MCG (1000 UNIT) tablet Take 2,000 Units by mouth 2 (two) times daily.   Yes [provider]  cloNIDine  (CATAPRES ) 0.1 MG tablet Take 1 tablet (0.1 mg total) by mouth 2 (two) times daily as needed (pressure >175). 02/16/24  Yes Gollan, Timothy J, MD  daptomycin  (CUBICIN ) IVPB Inject 700 mg into the vein every other day. Indication:  Cervical spine discitis First Dose: Yes Last Day of Therapy:  05/20/2024 Labs - Once weekly:  CBC/D, CMP, CPK, ESR and CRP Fax weekly lab results  promptly to [phone] Method of administration: IV  Push Method of administration may be changed at the discretion of home infusion pharmacist based upon assessment of the patient and/or caregiver's ability to self-administer the medication ordered. Radiology to remove central line after completion of antibiotic Call [phone] with any questions or critical value 04/11/24 05/20/24 Yes Laurita Pillion, MD  Evolocumab  (REPATHA  SURECLICK) 140 MG/ML SOAJ INJECT 1 ML UNDER THE SKIN EVERY 14 DAYS 02/16/24  Yes Gollan, Timothy J, MD  ezetimibe  (ZETIA ) 10 MG tablet Take 1 tablet (10 mg total) by mouth daily. 02/16/24  Yes Gollan, Timothy J, MD  fluconazole  (DIFLUCAN ) 200 MG tablet Take 1 tablet (200 mg total) by mouth daily. 04/27/24  Yes Fayette Bodily, MD  gentamicin  cream (GARAMYCIN ) 0.1 % Apply 1 Application topically 3 (three) times daily. 03/24/24  Yes [provider]  HUMALOG  MIX 75/25 KWIKPEN (75-25) 100 UNIT/ML KwikPen Inject 20 Units into the skin every morning. 75-25 pen injectior   Yes [provider]  hydrALAZINE  (APRESOLINE ) 100 MG tablet Take 1 tablet (100 mg total) by mouth 3 (three) times daily. Patient taking differently: Take 100 mg by mouth 2 (two) times daily. 02/16/24 02/10/25 Yes Gollan, Timothy J, MD  insulin  lispro (HUMALOG ) 100 UNIT/ML KwikPen Inject 0-15 Units into the skin 3 (three) times daily.   Yes [provider]  isosorbide  mononitrate (IMDUR ) 30 MG 24 hr tablet Take 1 tablet (30 mg total) by mouth 2 (two) times daily. 02/16/24  Yes Gollan, Timothy J, MD  nortriptyline  (PAMELOR ) 50 MG capsule Take 50 mg by mouth at bedtime.   Yes [provider]  nystatin  ointment (MYCOSTATIN ) Apply 1 Application topically daily at 12 noon. 05/13/24 05/13/25 Yes [provider]  oxyCODONE  (OXY IR/ROXICODONE ) 5 MG immediate release tablet Take 1 tablet (5 mg total) by mouth 2 (two) times daily as needed for severe pain (pain score 7-10). 04/13/24  Yes Abernathy, Mardy, NP  pantoprazole  (PROTONIX )  40 MG tablet Take 1 tablet (40 mg total) by mouth 2 (two) times daily before a meal. 11/19/23  Yes Abernathy, Alyssa, NP  torsemide  (DEMADEX ) 20 MG tablet Take 1 tablet (20 mg total) by mouth 2 (two) times daily. Patient taking differently: Take 40-60 mg by mouth 2 (two) times daily. 60 mg every morning and 40 mg 12 hours later 02/16/24  Yes Gollan, Timothy J, MD  TOUJEO  MAX SOLOSTAR 300 UNIT/ML Solostar Pen Inject 60 Units into the skin daily.   Yes [provider]  Insulin  Pen Needle (B-D ULTRAFINE III SHORT PEN) 31G X 8 MM MISC To use with pen basal and mealtime  coverage insulin  pens 5 times daily. DX: E11.65 04/17/20   Hanford Powell BRAVO, NP  nicotine  (NICODERM CQ  - DOSED IN MG/24 HOURS) 21 mg/24hr patch Place 1 patch (21 mg total) onto the skin daily. Patient not taking: No sig reported 04/12/24   Laurita Pillion, MD  nitroGLYCERIN  (NITROSTAT ) 0.4 MG SL tablet Place 1 tablet (0.4 mg total) under the tongue every 5 (five) minutes as needed for chest pain. Patient not taking: Reported on 05/18/2024 02/16/24   Perla Evalene PARAS, MD    Physical Exam: Vitals:   05/17/24 1958 05/18/24 0025 05/18/24 0601  BP: (!) 151/84 (!) 153/91 (!) 149/79  Pulse: 87 85 79  Resp: 16 16 19   Temp: 97.7 F (36.5 C) 98.2 F (36.8 C) 97.7 F (36.5 C)  TempSrc: Oral Oral Oral  SpO2: 100% 99% 100%  Weight: 74.1 kg    Height: 6' 1 (1.854 m)      Physical Exam   Constitutional: Alert, awake, calm, comfortable HEENT: Neck supple Respiratory: Clear to auscultation B/L, no wheezing, no rales.  Cardiovascular: Regular rate and rhythm, no murmurs / rubs / gallops. No extremity edema. 2+ pedal pulses. No carotid bruits.  Abdomen: Soft, no tenderness, Bowel sounds positive.  Musculoskeletal: Right leg has red, erythematous and necrotic tissue on the golf backside up to the heel.  Skin: Areas of ulcers and wounds on the back of the right leg Neurologic: CN 2-12 grossly intact. Sensation intact, No focal deficit  identified Psychiatric: Alert and oriented x 3. Normal mood.    Labs on Admission: I have personally reviewed following labs and imaging studies  CBC: Recent Labs  Lab 05/17/24 2006  WBC 11.3*  NEUTROABS 7.9*  HGB 13.4  HCT 39.9  MCV 94.8  PLT 311   Basic Metabolic Panel: Recent Labs  Lab 05/17/24 2006  NA 133*  K 3.5  CL 89*  CO2 27  GLUCOSE 207*  BUN 63*  CREATININE 6.03*  CALCIUM  10.1   GFR: Estimated Creatinine Clearance: 12.8 mL/min (A) (by C-G formula based on SCr of 6.03 mg/dL (H)). Liver Function Tests: No results for input(s): AST, ALT, ALKPHOS, BILITOT, PROT, ALBUMIN  in the last 168 hours. No results for input(s): LIPASE, AMYLASE in the last 168 hours. No results for input(s): AMMONIA in the last 168 hours. Coagulation Profile: No results for input(s): INR, PROTIME in the last 168 hours. Cardiac Enzymes: No results for input(s): CKTOTAL, CKMB, CKMBINDEX, TROPONINI, TROPONINIHS in the last 168 hours. BNP (last 3 results) Recent Labs    04/01/24 0443  BNP 1,856.5*   HbA1C: No results for input(s): HGBA1C in the last 72 hours. CBG: Recent Labs  Lab 05/18/24 0118 05/18/24 0844  GLUCAP 191* 131*   Lipid Profile: No results for input(s): CHOL, HDL, LDLCALC, TRIG, CHOLHDL, LDLDIRECT in the last 72 hours. Thyroid  Function Tests: No results for input(s): TSH, T4TOTAL, FREET4, T3FREE, THYROIDAB in the last 72 hours. Anemia Panel: No results for input(s): VITAMINB12, FOLATE, FERRITIN, TIBC, IRON, RETICCTPCT in the last 72 hours. Urine analysis:    Component Value Date/Time   COLORURINE YELLOW (A) 08/31/2020 1942   APPEARANCEUR CLEAR (A) 08/31/2020 1942   LABSPEC 1.012 08/31/2020 1942   PHURINE 7.0 08/31/2020 1942   GLUCOSEU >=500 (A) 08/31/2020 1942   HGBUR MODERATE (A) 08/31/2020 1942   BILIRUBINUR NEGATIVE 08/31/2020 1942   KETONESUR 5 (A) 08/31/2020 1942   PROTEINUR >=300 (A)  08/31/2020 1942   NITRITE NEGATIVE 08/31/2020 1942   LEUKOCYTESUR NEGATIVE 08/31/2020 1942  Radiological Exams on Admission: I have personally reviewed images No results found.  EKG: N/A    Assessment/Plan Principal Problem:   Wound infection Active Problems:   Chronic leg and foot pain (Bilateral)   Osteomyelitis of cervical spine (HCC)   Diabetes mellitus with complication, with long-term current use of insulin  (HCC)   Essential hypertension   ESRD on peritoneal dialysis (HCC)   HLD (hyperlipidemia)   Depression with anxiety   Hx of CABG   Gastroesophageal reflux disease without esophagitis    Assessment and Plan: 63 year old male with multiple medical problems including but not limited to diabetes, HTN, HLD, PVD, CAD s/p CABG, ESRD on peritoneal dialysis, anxiety/depression, chronic leg pain and recent diagnosis of cervical spine discitis/osteomyelitis on ongoing antibiotics with ceftriaxone  and daptomycin  per ID presented to ED after wound care clinic sent him to the emergency room for right leg wound infection.  1.  Right leg wound infection with necrotic tissue - He will be admitted to hospital as inpatient for IV antibiotic, wound care, vascular surgery evaluation and ID. - Discussed with ID who advised that patient to continue ceftriaxone  and daptomycin  which will be continued. - Wound care has been requested along with vascular surgery consult. - Pain medication with patient's home medications which is patient preference. - Further management and plan after evaluation by ID, vascular and wound care.  2.  Discitis of cervical spine on ongoing daptomycin  and ceftriaxone  with PICC line - ID advised to continue those medications - ID input  appreciated  3.  Type 2 diabetes - He will be placed on insulin  Lantus  40 unit and sliding scale - It looks like he was taking 60 units at home.  Will need to update dose if blood sugar remain high. - Continue monitor blood  sugars  4.  HTN/HLD - Resume his home medications including amlodipine , Coreg , clonidine  - Continue monitor blood pressure  5.  ESRD on peritoneal dialysis - He needs dialysis. - He has torsemide  and will resume his home dose - Nephrology has been consulted  6.  CAD s/p CABG - Resume his home medications aspirin , Coreg , statin  7.  GERD - Resume on Protonix   8.  Anxiety/depression - Currently patient was taking Xanax  which will be continued.        DVT prophylaxis: SQ Heparin  Code Status: DNR/DNI(Do NOT Intubate) Family Communication: None available Disposition Plan: Home Consults called: Infectious disease, nephrology, vascular Admission status: Inpatient, Telemetry bed   Nena Rebel, MD [facility] 05/18/2024, 9:21 AM       [1]  Allergies Allergen Reactions   Iodinated Contrast Media Other (See Comments)    Kidney disease  Kidney disease  Kidney disease  Kidney disease   Other Other (See Comments)    Pain  With joint stiffness     Statins Other (See Comments)    Pain  With joint stiffness   Pain  With joint stiffness  Other Reaction(s): Joint Pain   Pregabalin Other (See Comments)    Generalized aches and pains Generalized aches and pains Generalized aches and pains Generalized aches and pains Generalized aches and pains   Sacubitril-Valsartan Other (See Comments)    Hyperkalemia  Hyperkalemia Hyperkalemia  Hyperkalemia   Atorvastatin Other (See Comments)    Muscle aches Muscle aches   Pravastatin Other (See Comments)    Muscle aches Muscle aches   Rosuvastatin Other (See Comments)    Muscle Aches Other reaction(s): JOINT PAIN Other reaction(s): JOINT PAIN Muscle Aches    "

## 2024-05-18 NOTE — ED Provider Notes (Signed)
 "  [facility] Provider Note    Event Date/Time   First MD Initiated Contact with Patient 05/18/24 0010     (approximate)   History   Cellulitis   HPI  [name] [name] is a 63 y.o. 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  Patient reports that right leg wound began approximately 4 days ago and has progressively worsened.  He was seen at wound clinic and advised to come to the emergency department.  He reports compliance of all of his medications.  He states that his glucoses have been within range and he has never been in DKA within 30 years.  He presents with his wife who contributes to history.  Denies any fevers or chills      Physical Exam   Triage Vital Signs: ED Triage Vitals  Encounter Vitals Group     BP 05/17/24 1958 (!) 151/84     Girls Systolic BP Percentile --      Girls Diastolic BP Percentile --      Boys Systolic BP Percentile --      Boys Diastolic BP Percentile --      Pulse Rate 05/17/24 1958 87     Resp 05/17/24 1958 16     Temp 05/17/24 1958 97.7 F (36.5 C)     Temp Source 05/17/24 1958 Oral     SpO2 05/17/24 1958 100 %     Weight 05/17/24 1958 163 lb 5.8 oz (74.1 kg)     Height 05/17/24 1958 6' 1 (1.854 m)     Head Circumference --      Peak Flow --      Pain Score 05/17/24 2003 0     Pain Loc --      Pain Education --      Exclude from Growth Chart --     Most recent vital signs: Vitals:   05/17/24 1958 05/18/24 0025  BP: (!) 151/84 (!) 153/91  Pulse: 87 85  Resp: 16 16  Temp: 97.7 F (36.5 C) 98.2 F (36.8 C)  SpO2: 100% 99%    Nursing Triage Note reviewed. Vital signs reviewed and patients oxygen saturation is normoxic  General: Patient is well nourished, well developed, awake and  alert, appears chronically ill Head: Normocephalic and atraumatic Eyes: Normal inspection, extraocular muscles intact, no conjunctival pallor Ear, nose, throat: Normal external exam Neck: Normal range of motion, no neck erythema, mild ttp Respiratory: Patient is in no respiratory distress, lungs CTAB Cardiovascular: Patient is not tachycardic, RRR without murmur appreciated GI: Abd SNT with no guarding or rebound  Back: Normal inspection of the back with good strength and range of motion throughout all ext Extremities: pulses intact with good cap refills, no LE pitting edema or calf tenderness Neuro: The patient is alert and oriented to person, place, and time, appropriately conversive, with 5/5 bilat UE/LE strength, no gross motor or sensory defects noted. Coordination appears to be adequate. Skin:    Media Information   Document Information  Photographic Image: Photos  Right leg  05/18/2024 00:50  Attached To:  Hospital Encounter on 05/18/24  Source Information  Nicholaus Rolland BRAVO, MD  [facility]   Psych: normal mood and affect, no SI or HI  ED Results / Procedures / Treatments   Labs (  all labs ordered are listed, but only abnormal results are displayed) Labs Reviewed  CBC WITH DIFFERENTIAL/PLATELET - Abnormal; Notable for the following components:      Result Value   WBC 11.3 (*)    RBC 4.21 (*)    Neutro Abs 7.9 (*)    Basophils Absolute 0.3 (*)    All other components within normal limits  BASIC METABOLIC PANEL WITH GFR - Abnormal; Notable for the following components:   Sodium 133 (*)    Chloride 89 (*)    Glucose, Bld 207 (*)    BUN 63 (*)    Creatinine, Ser 6.03 (*)    GFR, Estimated 10 (*)    Anion gap 18 (*)    All other components within normal limits  BLOOD GAS, VENOUS - Abnormal; Notable for the following components:   Bicarbonate 28.4 (*)    Acid-Base Excess 2.5 (*)    All other components within normal limits  BETA-HYDROXYBUTYRIC ACID  - Abnormal; Notable for the following components:   Beta-Hydroxybutyric Acid 0.31 (*)    All other components within normal limits  CBG MONITORING, ED - Abnormal; Notable for the following components:   Glucose-Capillary 191 (*)    All other components within normal limits  CULTURE, BLOOD (ROUTINE X 2)  CULTURE, BLOOD (ROUTINE X 2)  CULTURE, BLOOD (SINGLE)  LACTIC ACID, PLASMA  URINALYSIS, COMPLETE (UACMP) WITH MICROSCOPIC     EKG None  RADIOLOGY None    PROCEDURES:  Critical Care performed: No  Procedures   MEDICATIONS ORDERED IN ED: Medications  insulin  aspart (novoLOG ) injection 0-15 Units (has no administration in time range)  insulin  aspart (novoLOG ) injection 0-5 Units ( Subcutaneous Not Given 05/18/24 0119)  sodium chloride  0.9 % bolus 1,000 mL (1,000 mLs Intravenous New Bag/Given 05/18/24 0123)     IMPRESSION / MDM / ASSESSMENT AND PLAN / ED COURSE                                Differential diagnosis includes, but is not limited to, bacteremia, worsening discitis, DKA, electrolyte derangement anemia   ED course: Very difficult cases patient has been on prolonged antibiotic course and is followed by infectious disease but still pills with new wounds consistent with infection.  He does have a mild leukocytosis of 11.3 which was 11.93 weeks ago.  No elevated lactic acidosis.  He does not have evidence of DKA at this time although he does have a slight anion gap, his CO2 is not deranged).  He will require peritoneal dialysis and likely a repeat infectious disease consult.  I will discuss antibiotic choice with the hospitalist for admission given this complicated case but in the meantime we will obtain blood blood cultures and a blood culture from the PICC line   Clinical Course as of 05/18/24 0339  Tue May 18, 2024  0025 Anion gap(!): 18 [HD]  0042 Lactic Acid, Venous: 1.2 Not elevated [HD]  0104 Creatinine(!): 6.03 Patient is on peritoneal dialysis [HD]   0108 Blood gas, venous(!) No acidosis we will initiate an insulin  sliding scale and a diet [HD]  0239 Case discussed with hospitalist for admission [HD]    Clinical Course User Index [HD] Nicholaus Rolland BRAVO, MD   -- Risk: 5 This patient has a high risk of morbidity due to further diagnostic testing or treatment. Rationale: This patients evaluation and management involve a high risk of morbidity due to the  potential severity of presenting symptoms, need for diagnostic testing, and/or initiation of treatment that may require close monitoring. The differential includes conditions with potential for significant deterioration or requiring escalation of care. Treatment decisions in the ED, including medication administration, procedural interventions, or disposition planning, reflect this level of risk. COPA: 5 The patient has the following acute or chronic illness/injury that poses a possible threat to life or bodily function: [X] : The patient has a potentially serious acute condition or an acute exacerbation of a chronic illness requiring urgent evaluation and management in the Emergency Department. The clinical presentation necessitates immediate consideration of life-threatening or function-threatening diagnoses, even if they are ultimately ruled out.   FINAL CLINICAL IMPRESSION(S) / ED DIAGNOSES   Final diagnoses:  Bacteremia  Wound of right lower extremity, initial encounter     Rx / DC Orders   ED Discharge Orders     None        Note:  This document was prepared using Dragon voice recognition software and may include unintentional dictation errors.   Nicholaus Rolland BRAVO, MD 05/18/24 [phone]  "

## 2024-05-19 DIAGNOSIS — S81801D Unspecified open wound, right lower leg, subsequent encounter: Secondary | ICD-10-CM

## 2024-05-19 DIAGNOSIS — S81801A Unspecified open wound, right lower leg, initial encounter: Secondary | ICD-10-CM

## 2024-05-19 DIAGNOSIS — I5020 Unspecified systolic (congestive) heart failure: Secondary | ICD-10-CM | POA: Insufficient documentation

## 2024-05-19 LAB — GLUCOSE, CAPILLARY
Glucose-Capillary: 132 mg/dL — ABNORMAL HIGH (ref 70–99)
Glucose-Capillary: 70 mg/dL (ref 70–99)
Glucose-Capillary: 122 mg/dL — ABNORMAL HIGH (ref 70–99)

## 2024-05-19 LAB — BASIC METABOLIC PANEL WITH GFR
Anion gap: 17 — ABNORMAL HIGH (ref 5–15)
BUN: 69 mg/dL — ABNORMAL HIGH (ref 8–23)
CO2: 24 mmol/L (ref 22–32)
Calcium: 9 mg/dL (ref 8.9–10.3)
Chloride: 96 mmol/L — ABNORMAL LOW (ref 98–111)
Creatinine, Ser: 7.14 mg/dL — ABNORMAL HIGH (ref 0.61–1.24)
GFR, Estimated: 8 mL/min — ABNORMAL LOW
Glucose, Bld: 150 mg/dL — ABNORMAL HIGH (ref 70–99)
Potassium: 3.5 mmol/L (ref 3.5–5.1)
Sodium: 136 mmol/L (ref 135–145)

## 2024-05-19 LAB — HEPATITIS B SURFACE ANTIGEN: Hepatitis B Surface Ag: NONREACTIVE

## 2024-05-19 LAB — HEPATIC FUNCTION PANEL
ALT: 16 U/L (ref 0–44)
AST: 18 U/L (ref 15–41)
Albumin: 3.1 g/dL — ABNORMAL LOW (ref 3.5–5.0)
Alkaline Phosphatase: 133 U/L — ABNORMAL HIGH (ref 38–126)
Bilirubin, Direct: 0.1 mg/dL (ref 0.0–0.2)
Indirect Bilirubin: 0.1 mg/dL — ABNORMAL LOW (ref 0.3–0.9)
Total Bilirubin: 0.3 mg/dL (ref 0.0–1.2)
Total Protein: 6.2 g/dL — ABNORMAL LOW (ref 6.5–8.1)

## 2024-05-19 NOTE — Consult Note (Signed)
 WOC Nurse Consult Note:  Noted new WOC consult, patient seen on 12/23, see note.  No new needs identified, current wound care remains appropriate, will not re consult at this time.   Thank you,  Doyal Polite, MSN, RN, [facility] WOC Team [phone] (Available Mon-Fri 0700-1500)

## 2024-05-19 NOTE — Progress Notes (Signed)
 D/C order noted.  Contacted [facility] of pt and d/c today. Requested documents faxed to clinic for continuation of care.  Suzen Satchel Dialysis Navigator [phone]

## 2024-05-19 NOTE — Progress Notes (Signed)
 PD pt receives outpt care at [facility]. Navigator following to assist with any HD needs.  Suzen Satchel Dialysis Navigator [email or web address]

## 2024-05-19 NOTE — Plan of Care (Signed)
" °  Problem: Education: Goal: Ability to describe self-care measures that may prevent or decrease complications (Diabetes Survival Skills Education) will improve Outcome: Progressing Goal: Individualized Educational Video(s) Outcome: Progressing   Problem: Coping: Goal: Ability to adjust to condition or change in health will improve Outcome: Progressing   Problem: Fluid Volume: Goal: Ability to maintain a balanced intake and output will improve Outcome: Progressing   Problem: Health Behavior/Discharge Planning: Goal: Ability to identify and utilize available resources and services will improve Outcome: Progressing Goal: Ability to manage health-related needs will improve Outcome: Progressing   Problem: Metabolic: Goal: Ability to maintain appropriate glucose levels will improve Outcome: Progressing   Problem: Nutritional: Goal: Maintenance of adequate nutrition will improve Outcome: Progressing Goal: Progress toward achieving an optimal weight will improve Outcome: Progressing   Problem: Skin Integrity: Goal: Risk for impaired skin integrity will decrease Outcome: Progressing   Problem: Tissue Perfusion: Goal: Adequacy of tissue perfusion will improve Outcome: Progressing   Problem: Health Behavior/Discharge Planning: Goal: Ability to manage health-related needs will improve Outcome: Progressing   Problem: Education: Goal: Knowledge of General Education information will improve Description: Including pain rating scale, medication(s)/side effects and non-pharmacologic comfort measures Outcome: Progressing   Problem: Clinical Measurements: Goal: Ability to maintain clinical measurements within normal limits will improve Outcome: Progressing Goal: Will remain free from infection Outcome: Progressing Goal: Diagnostic test results will improve Outcome: Progressing Goal: Respiratory complications will improve Outcome: Progressing Goal: Cardiovascular complication will  be avoided Outcome: Progressing   Problem: Nutrition: Goal: Adequate nutrition will be maintained Outcome: Progressing   Problem: Coping: Goal: Level of anxiety will decrease Outcome: Progressing   Problem: Pain Managment: Goal: General experience of comfort will improve and/or be controlled Outcome: Progressing   Problem: Safety: Goal: Ability to remain free from injury will improve Outcome: Progressing   Problem: Skin Integrity: Goal: Risk for impaired skin integrity will decrease Outcome: Progressing   "

## 2024-05-19 NOTE — Discharge Summary (Signed)
 [name] [name] FMW:[id number] DOB: 1960-12-28 DOA: 05/18/2024  PCP: Liana Fish, NP  Admit date: 05/18/2024 Discharge date: 05/19/2024  Time spent: 35 minutes  Recommendations for Outpatient Follow-up:  Nephrology, pcp, ID, wound care f/u     Discharge Diagnoses:  Principal Problem:   Wound infection Active Problems:   Chronic leg and foot pain (Bilateral)   Osteomyelitis of cervical spine (HCC)   Diabetes mellitus with complication, with long-term current use of insulin  (HCC)   Essential hypertension   ESRD on peritoneal dialysis (HCC)   HLD (hyperlipidemia)   CAD (coronary artery disease)   COPD (chronic obstructive pulmonary disease) (HCC)   Depression with anxiety   Sleep apnea   Hx of CABG   Gastroesophageal reflux disease without esophagitis   Wound of right leg   HFrEF (heart failure with reduced ejection fraction) (HCC)   Discharge Condition: stable  Diet recommendation: low sodium heart healthy  Filed Weights   05/17/24 1958  Weight: 74.1 kg    History of present illness:  [name] [name] is a pleasant 63 y.o. 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  Patient stated that he has a right leg wound began approximately 4 days ago which has progressively gotten worse.  He went to wound clinicwhere he was advised to go to the emergency room as the wound is bad and needs emergent evaluation.  He reports compliance with all the medications prescribed. He complains of severe pain 8/10 in intensity but does not want more than his prescribed medication oxycodone , getting worse to the point that he is not able to handle that at home, denies any fever, chills, nausea, vomiting, diarrhea, abdominal pain, chest pain, shortness of  breath, palpitations.    Hospital Course:   Patient presents referred from wound care clinic for lower extremity wounds. These are chronic and progressive. Patient was seen here by ID, vascular surgery, and nephrology. 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 ID does not think they are infected, and regardless patient is currently on ceftriaxone /daptomycin  plus fluconazole  for recent discitis and superficial swab positive for candida. ID did not advise change in abx, patient will f/u with Dr. Fayette as scheduled. Vascular surgery says circulation is adequate and did not advise vascular intervention. Patient is in discussion with nephrology about switching dialysis to hemodialysis (currently on peritoneal) to help treat the calciphylaxis - patient is contemplating this. Discharged then with plan to f/u with nephrology to continue those discussions, continue with the outpatient wound care center, and continue home health.   Procedures: none   Consultations: ID, nephrology, vascular surgery  Discharge Exam: Vitals:   05/19/24 0410 05/19/24 0737  BP: 110/64 117/65  Pulse: 61 63  Resp: 18 18  Temp: 97.9 F (36.6 C) 97.7 F (36.5 C)  SpO2: (!) 89% 100%    General: NAD Cardiovascular: RRR Respiratory: CTAB Ext:  Discharge Instructions   Discharge Instructions     Diet - low sodium heart healthy   Complete by: As directed    Discharge wound care:   Complete by: As directed    Per instructions from the wound care center   Increase activity slowly   Complete by: As directed       Allergies as of 05/19/2024  Reactions   Iodinated Contrast Media Other (See Comments)   Kidney disease  Kidney disease  Kidney disease  Kidney disease   Other Other (See Comments)   Pain  With joint stiffness    Statins Other (See Comments)   Pain  With joint stiffness  Pain  With joint stiffness Other Reaction(s): Joint Pain   Pregabalin Other (See Comments)   Generalized aches and  pains Generalized aches and pains Generalized aches and pains Generalized aches and pains Generalized aches and pains   Sacubitril-valsartan Other (See Comments)   Hyperkalemia Hyperkalemia Hyperkalemia  Hyperkalemia   Atorvastatin Other (See Comments)   Muscle aches Muscle aches   Pravastatin Other (See Comments)   Muscle aches Muscle aches   Rosuvastatin Other (See Comments)   Muscle Aches Other reaction(s): JOINT PAIN Other reaction(s): JOINT PAIN Muscle Aches        Medication List     TAKE these medications    albuterol  108 (90 Base) MCG/ACT inhaler Commonly known as: VENTOLIN  HFA Inhale 1-2 puffs into the lungs every 6 (six) hours as needed for wheezing or shortness of breath.   ALPRAZolam  0.5 MG tablet Commonly known as: XANAX  Take 1 tablet (0.5 mg total) by mouth 2 (two) times daily as needed for anxiety.   amLODipine  10 MG tablet Commonly known as: NORVASC  Take 1 tablet (10 mg total) by mouth daily.   aspirin  EC 81 MG tablet Take 81 mg by mouth daily. Swallow whole.   B-D ULTRAFINE III SHORT PEN 31G X 8 MM Misc Generic drug: Insulin  Pen Needle To use with pen basal and mealtime coverage insulin  pens 5 times daily. DX: E11.65   bisacodyl  5 MG EC tablet Commonly known as: DULCOLAX Take 10 mg by mouth 2 (two) times daily as needed for moderate constipation.   carvedilol  3.125 MG tablet Commonly known as: COREG  Take 1 tablet (3.125 mg total) by mouth 2 (two) times daily with a meal.   cefTRIAXone  IVPB Commonly known as: ROCEPHIN  Inject 2 g into the vein daily. Indication:  Cervical spine discitis First Dose: Yes Last Day of Therapy:  05/20/2024 Labs - Once weekly:  CBC/D, CMP, CPK, ESR and CRP Fax weekly lab results  promptly to [phone] Method of administration: IV Push Method of administration may be changed at the discretion of home infusion pharmacist based upon assessment of the patient and/or caregiver's ability to self-administer the  medication ordered. Radiology to remove central line after completion of antibiotic Call [phone] with any questions or critical value   cholecalciferol  25 MCG (1000 UNIT) tablet Commonly known as: VITAMIN D3 Take 2,000 Units by mouth 2 (two) times daily.   cloNIDine  0.1 MG tablet Commonly known as: Catapres  Take 1 tablet (0.1 mg total) by mouth 2 (two) times daily as needed (pressure >175).   daptomycin  IVPB Commonly known as: CUBICIN  Inject 700 mg into the vein every other day. Indication:  Cervical spine discitis First Dose: Yes Last Day of Therapy:  05/20/2024 Labs - Once weekly:  CBC/D, CMP, CPK, ESR and CRP Fax weekly lab results  promptly to [phone] Method of administration: IV Push Method of administration may be changed at the discretion of home infusion pharmacist based upon assessment of the patient and/or caregiver's ability to self-administer the medication ordered. Radiology to remove central line after completion of antibiotic Call [phone] with any questions or critical value   ezetimibe  10 MG tablet Commonly known as: ZETIA  Take 1 tablet (10 mg total) by mouth  daily.   fluconazole  200 MG tablet Commonly known as: DIFLUCAN  Take 1 tablet (200 mg total) by mouth daily.   gentamicin  cream 0.1 % Commonly known as: GARAMYCIN  Apply 1 Application topically 3 (three) times daily.   HumaLOG  Mix 75/25 KwikPen (75-25) 100 UNIT/ML KwikPen Generic drug: Insulin  Lispro Prot & Lispro Inject 20 Units into the skin every morning. 75-25 pen injectior   hydrALAZINE  100 MG tablet Commonly known as: APRESOLINE  Take 1 tablet (100 mg total) by mouth 3 (three) times daily. What changed: when to take this   insulin  lispro 100 UNIT/ML KwikPen Commonly known as: HUMALOG  Inject 0-15 Units into the skin 3 (three) times daily.   isosorbide  mononitrate 30 MG 24 hr tablet Commonly known as: IMDUR  Take 1 tablet (30 mg total) by mouth 2 (two) times daily.   nicotine   21 mg/24hr patch Commonly known as: NICODERM CQ  - dosed in mg/24 hours Place 1 patch (21 mg total) onto the skin daily.   nitroGLYCERIN  0.4 MG SL tablet Commonly known as: NITROSTAT  Place 1 tablet (0.4 mg total) under the tongue every 5 (five) minutes as needed for chest pain.   nortriptyline  50 MG capsule Commonly known as: PAMELOR  Take 50 mg by mouth at bedtime.   nystatin  ointment Commonly known as: MYCOSTATIN  Apply 1 Application topically daily at 12 noon.   oxyCODONE  5 MG immediate release tablet Commonly known as: Oxy IR/ROXICODONE  Take 1 tablet (5 mg total) by mouth 2 (two) times daily as needed for severe pain (pain score 7-10).   pantoprazole  40 MG tablet Commonly known as: PROTONIX  Take 1 tablet (40 mg total) by mouth 2 (two) times daily before a meal.   Repatha  SureClick 140 MG/ML Soaj Generic drug: Evolocumab  INJECT 1 ML UNDER THE SKIN EVERY 14 DAYS   torsemide  20 MG tablet Commonly known as: DEMADEX  Take 1 tablet (20 mg total) by mouth 2 (two) times daily. What changed:  how much to take additional instructions   Toujeo  Max SoloStar 300 UNIT/ML Solostar Pen Generic drug: insulin  glargine (2 Unit Dial ) Inject 60 Units into the skin daily.               Discharge Care Instructions  (From admission, onward)           Start     Ordered   05/19/24 0000  Discharge wound care:       Comments: Per instructions from the wound care center   05/19/24 1158           Allergies[1]  Follow-up Information     Liana Fish, NP Follow up.   Specialty: Nurse Practitioner Contact information: [address] [place] [address] [phone]                  The results of significant diagnostics from this hospitalization (including imaging, microbiology, ancillary and laboratory) are listed below for reference.    Significant Diagnostic Studies: VAS US  ABI WITH/WO TBI Result Date: 04/28/2024  LOWER EXTREMITY DOPPLER STUDY Patient  Name:  Brayam Boeke  Date of Exam:   04/27/2024 Medical Rec #: [id number]        Accession #:    [id number] Date of Birth: 1960-07-21       Patient Gender: M Patient Age:   69 years Exam Location:  [facility] Procedure:      VAS US  ABI WITH/WO TBI Referring Phys: SELINDA DEW --------------------------------------------------------------------------------  Indications: Peripheral artery disease. High Risk Factors: Hypertension, current smoker, coronary artery  disease.  Performing Technologist: Donnice Charnley RVT  Examination Guidelines: A complete evaluation includes at minimum, Doppler waveform signals and systolic blood pressure reading at the level of bilateral brachial, anterior tibial, and posterior tibial arteries, when vessel segments are accessible. Bilateral testing is considered an integral part of a complete examination. Photoelectric Plethysmograph (PPG) waveforms and toe systolic pressure readings are included as required and additional duplex testing as needed. Limited examinations for reoccurring indications may be performed as noted.  ABI Findings: +---------+------------------+-----+----------+--------+ Right    Rt Pressure (mmHg)IndexWaveform  Comment  +---------+------------------+-----+----------+--------+ Brachial 146                                       +---------+------------------+-----+----------+--------+ PTA      250               1.71 monophasic         +---------+------------------+-----+----------+--------+ DP       250               1.71 biphasic           +---------+------------------+-----+----------+--------+ Great Toe92                0.63                    +---------+------------------+-----+----------+--------+ +---------+------------------+-----+----------+-------+ Left     Lt Pressure (mmHg)IndexWaveform  Comment +---------+------------------+-----+----------+-------+ PTA      250               1.71 monophasic         +---------+------------------+-----+----------+-------+ DP       250               1.71 biphasic          +---------+------------------+-----+----------+-------+ Great Toe97                0.66                   +---------+------------------+-----+----------+-------+ +-------+----------------+-----------+----------------+------------+ ABI/TBIToday's ABI     Today's TBIPrevious ABI    Previous TBI +-------+----------------+-----------+----------------+------------+ Right  Non compressible0.63       Non compressible0.87         +-------+----------------+-----------+----------------+------------+ Left   Non compressible0.66       Non compressible0.70         +-------+----------------+-----------+----------------+------------+  Arterial wall calcification precludes accurate ankle pressures and ABIs. Bilateral ABIs appear essentially unchanged compared to prior study on 10/28/2023.  Summary: Right: Resting right ankle-brachial index indicates noncompressible right lower extremity arteries. The right toe-brachial index is abnormal.  Left: Resting left ankle-brachial index indicates noncompressible left lower extremity arteries. The left toe-brachial index is abnormal.  *See table(s) above for measurements and observations.  Electronically signed by Selinda Gu MD on 04/28/2024 at 12:41:09 PM.    Final     Microbiology: Recent Results (from the past 240 hours)  Blood culture (single)     Status: None (Preliminary result)   Collection Time: 05/17/24  8:06 PM   Specimen: BLOOD  Result Value Ref Range Status   Specimen Description BLOOD RIGHT ANTECUBITAL  Final   Special Requests   Final    BOTTLES DRAWN AEROBIC AND ANAEROBIC Blood Culture adequate volume   Culture   Final    NO GROWTH < 12 HOURS Performed at [facility], [address] [place], [place] [address]    Report Status PENDING  Incomplete  Blood culture (routine x 2)     Status: None (Preliminary result)    Collection Time: 05/18/24  1:42 AM   Specimen: BLOOD  Result Value Ref Range Status   Specimen Description BLOOD BLOOD LEFT ARM  Final   Special Requests   Final    BOTTLES DRAWN AEROBIC AND ANAEROBIC Blood Culture adequate volume   Culture   Final    NO GROWTH < 12 HOURS Performed at [facility], [address], [place], [place] [address]    Report Status PENDING  Incomplete     Labs: Basic Metabolic Panel: Recent Labs  Lab 05/17/24 2006 05/18/24 1508  NA 133*  --   K 3.5  --   CL 89*  --   CO2 27  --   GLUCOSE 207*  --   BUN 63*  --   CREATININE 6.03* 6.47*  CALCIUM  10.1  --    Liver Function Tests: Recent Labs  Lab 05/19/24 0441  AST 18  ALT 16  ALKPHOS 133*  BILITOT 0.3  PROT 6.2*  ALBUMIN  3.1*   No results for input(s): LIPASE, AMYLASE in the last 168 hours. No results for input(s): AMMONIA in the last 168 hours. CBC: Recent Labs  Lab 05/17/24 2006 05/18/24 1508  WBC 11.3* 12.0*  NEUTROABS 7.9*  --   HGB 13.4 11.3*  HCT 39.9 33.8*  MCV 94.8 94.7  PLT 311 268   Cardiac Enzymes: Recent Labs  Lab 05/18/24 1508  CKTOTAL 112   BNP: BNP (last 3 results) Recent Labs    04/01/24 0443  BNP 1,856.5*    ProBNP (last 3 results) No results for input(s): PROBNP in the last 8760 hours.  CBG: Recent Labs  Lab 05/18/24 0844 05/18/24 1716 05/18/24 2125 05/19/24 0737 05/19/24 1105  GLUCAP 131* 194* 251* 132* 70       Signed:  Devaughn KATHEE Ban MD.  [facility] 05/19/2024, 12:00 PM     [1]  Allergies Allergen Reactions   Iodinated Contrast Media Other (See Comments)    Kidney disease  Kidney disease  Kidney disease  Kidney disease   Other Other (See Comments)    Pain  With joint stiffness     Statins Other (See Comments)    Pain  With joint stiffness   Pain  With joint stiffness  Other Reaction(s): Joint Pain   Pregabalin Other (See Comments)    Generalized aches and pains Generalized aches and  pains Generalized aches and pains Generalized aches and pains Generalized aches and pains   Sacubitril-Valsartan Other (See Comments)    Hyperkalemia  Hyperkalemia Hyperkalemia  Hyperkalemia   Atorvastatin Other (See Comments)    Muscle aches Muscle aches   Pravastatin Other (See Comments)    Muscle aches Muscle aches   Rosuvastatin Other (See Comments)    Muscle Aches Other reaction(s): JOINT PAIN Other reaction(s): JOINT PAIN Muscle Aches

## 2024-05-19 NOTE — Progress Notes (Signed)
 Patient continues to refuse our Insulin  and states that it is too expensive in this hospital to take and he will not take it.

## 2024-05-19 NOTE — Progress Notes (Signed)
 Discussed urine specimen needed with patient. patient verbalized understanding. Reporting do not produce urine. Educated if at least 5ml maybe of use. Verbalized understanding.

## 2024-05-19 NOTE — Progress Notes (Signed)
 " [facility]  ROUNDING NOTE   Subjective:   [name] [name]  is a 63 year old male with past medical conditions including hypertension, poorly controlled diabetes with neuropathy, GERD, COPD, CAD, and end-stage renal disease on peritoneal dialysis. Presents to the ED for RLE wound and has been admitted for Bacteremia [R78.81] Wound infection [T14.8XXA, L08.9] Wound of right lower extremity, initial encounter [S81.801A]  Patient sitting up in bed. States he does not know why he's here. He's been receiving antibiotics at home and does not know why he can't continue. Denies pain. Room air without shortness of breath.   Labs stable for renal patient. White count slightly elevated.   We have been consulted to manage PD treatments.    Objective:  Vital signs in last 24 hours:  Temp:  [97.7 F (36.5 C)-98.8 F (37.1 C)] 97.7 F (36.5 C) (12/24 0737) Pulse Rate:  [61-89] 63 (12/24 0737) Resp:  [16-18] 18 (12/24 0737) BP: (106-128)/(54-65) 117/65 (12/24 0737) SpO2:  [89 %-100 %] 100 % (12/24 0737)  Weight change:  Filed Weights   05/17/24 1958  Weight: 74.1 kg    Intake/Output: I/O last 3 completed shifts: In: 1380 [P.O.:280; IV Piggyback:1100] Out: -    Intake/Output this shift:  Total I/O In: 240 [P.O.:240] Out: -   Physical Exam: General: NAD  Head: Normocephalic, atraumatic. Moist oral mucosal membranes  Eyes: Anicteric  Lungs:  Clear to auscultation  Heart: Regular rate and rhythm  Abdomen:  Soft, nontender. PD catheter  Extremities:  No peripheral edema.  Neurologic: Awake, alert, conversant  Skin: Warm,dry, no rash, RLE wound  [facility]: [facility]    Basic Metabolic Panel: Recent Labs  Lab 05/17/24 2006 05/18/24 1508 05/19/24 0441  NA 133*  --  136  K 3.5  --  3.5  CL 89*  --  96*  CO2 27  --  24  GLUCOSE 207*  --  150*  BUN 63*  --  69*  CREATININE 6.03* 6.47* 7.14*  CALCIUM  10.1  --  9.0    Liver Function Tests: Recent Labs  Lab  05/19/24 0441  AST 18  ALT 16  ALKPHOS 133*  BILITOT 0.3  PROT 6.2*  ALBUMIN  3.1*   No results for input(s): LIPASE, AMYLASE in the last 168 hours. No results for input(s): AMMONIA in the last 168 hours.  CBC: Recent Labs  Lab 05/17/24 2006 05/18/24 1508  WBC 11.3* 12.0*  NEUTROABS 7.9*  --   HGB 13.4 11.3*  HCT 39.9 33.8*  MCV 94.8 94.7  PLT 311 268    Cardiac Enzymes: Recent Labs  Lab 05/18/24 1508  CKTOTAL 112    BNP: Invalid input(s): POCBNP  CBG: Recent Labs  Lab 05/18/24 1716 05/18/24 2125 05/19/24 0737 05/19/24 1105 05/19/24 1317  GLUCAP 194* 251* 132* 70 122*    Microbiology: Results for orders placed or performed during the hospital encounter of 05/18/24  Blood culture (single)     Status: None (Preliminary result)   Collection Time: 05/17/24  8:06 PM   Specimen: BLOOD  Result Value Ref Range Status   Specimen Description BLOOD RIGHT ANTECUBITAL  Final   Special Requests   Final    BOTTLES DRAWN AEROBIC AND ANAEROBIC Blood Culture adequate volume   Culture   Final    NO GROWTH 1 DAY Performed at [facility], [address] [place], [place] [address]    Report Status PENDING  Incomplete  Blood culture (routine x 2)     Status:  None (Preliminary result)   Collection Time: 05/18/24  1:42 AM   Specimen: BLOOD  Result Value Ref Range Status   Specimen Description BLOOD BLOOD LEFT ARM  Final   Special Requests   Final    BOTTLES DRAWN AEROBIC AND ANAEROBIC Blood Culture adequate volume   Culture   Final    NO GROWTH 1 DAY Performed at [facility], [address], [place], [place] [address]    Report Status PENDING  Incomplete  Blood culture (routine x 2)     Status: None (Preliminary result)   Collection Time: 05/18/24  3:08 PM   Specimen: Right Antecubital; Blood  Result Value Ref Range Status   Specimen Description RIGHT ANTECUBITAL  Final   Special Requests   Final    BOTTLES DRAWN AEROBIC AND ANAEROBIC  Blood Culture results may not be optimal due to an inadequate volume of blood received in culture bottles   Culture   Final    NO GROWTH < 24 HOURS Performed at [facility], [address] [place], [place] [address]    Report Status PENDING  Incomplete    Coagulation Studies: No results for input(s): LABPROT, INR in the last 72 hours.  Urinalysis: No results for input(s): COLORURINE, LABSPEC, PHURINE, GLUCOSEU, HGBUR, BILIRUBINUR, KETONESUR, PROTEINUR, UROBILINOGEN, NITRITE, LEUKOCYTESUR in the last 72 hours.  Invalid input(s): APPERANCEUR    Imaging: No results found.   Medications:    cefTRIAXone  (ROCEPHIN )  IV 2 g (05/19/24 1055)   DAPTOmycin      dialysis solution 1.5% low-MG/low-CA Stopped (05/18/24 2230)    amLODipine   10 mg Oral Daily   aspirin  EC  81 mg Oral Daily   carvedilol   3.125 mg Oral BID WC   ezetimibe   10 mg Oral Daily   fluconazole   200 mg Oral Daily   gentamicin  cream  1 Application Topical Daily   heparin   5,000 Units Subcutaneous Q8H   insulin  aspart  0-5 Units Subcutaneous QHS   insulin  aspart  0-6 Units Subcutaneous TID WC   insulin  glargine  40 Units Subcutaneous QHS   isosorbide  mononitrate  30 mg Oral BID   nortriptyline   50 mg Oral QHS   nystatin  ointment  1 Application Topical Q1200   pantoprazole   40 mg Oral BID AC   torsemide   20 mg Oral BID   albuterol , ALPRAZolam , bisacodyl , cloNIDine , oxyCODONE   Assessment/ Plan:  [name] is a 63 y.o.  male with past medical conditions including hypertension, poorly controlled diabetes with neuropathy, GERD, COPD, CAD, and end-stage renal disease on peritoneal dialysis. Presents to the ED for evaluation of RLE wound.  End stage renal disease on peritoneal dialysis. Did not receive treatment yesterday. Will receive daytime, manual treatments. Recent changes made to outpatient prescription. Will maintain previous prescription until discharge.   2. Wound  infection, right leg. Necrotic tissue. ID and vascular surgery consulted. Receiving IV ceftriaxone  and daptomycin .   3. Anemia of chronic kidney disease Lab Results  Component Value Date   HGB 11.3 (L) 05/18/2024    Hgb acceptable  4. Hypertension with chronic kidney disease. Prescribed amlodipine , carvedilol , clonidine , isosorbide  and torsemide .     LOS: 1 Donnisha Besecker 12/24/20252:10 PM   "

## 2024-05-19 NOTE — Plan of Care (Signed)

## 2024-05-21 ENCOUNTER — Encounter: Payer: Self-pay | Admitting: Nurse Practitioner

## 2024-05-21 NOTE — Progress Notes (Signed)
 Late Note Entry- May 21, 2024  Clinic requested that pt's d/c summary and last renal note be re-faxed to clinic for continuation of care. Info re-faxed to clinic per their request.   Randine Mungo Dialysis Navigator (coverage) [phone]

## 2024-05-23 LAB — CULTURE, BLOOD (ROUTINE X 2)
Culture: NO GROWTH
Culture: NO GROWTH
Special Requests: ADEQUATE

## 2024-05-23 LAB — CULTURE, BLOOD (SINGLE)
Culture: NO GROWTH
Special Requests: ADEQUATE

## 2024-05-24 ENCOUNTER — Encounter: Admitting: Physician Assistant

## 2024-05-24 DIAGNOSIS — L98A318 Non-pressure chronic ulcer of right hand with other specified severity: Secondary | ICD-10-CM | POA: Diagnosis not present

## 2024-05-24 DIAGNOSIS — E1151 Type 2 diabetes mellitus with diabetic peripheral angiopathy without gangrene: Secondary | ICD-10-CM | POA: Diagnosis not present

## 2024-05-24 DIAGNOSIS — M4632 Infection of intervertebral disc (pyogenic), cervical region: Secondary | ICD-10-CM | POA: Diagnosis not present

## 2024-05-24 DIAGNOSIS — L97828 Non-pressure chronic ulcer of other part of left lower leg with other specified severity: Secondary | ICD-10-CM | POA: Diagnosis not present

## 2024-05-24 DIAGNOSIS — Z8619 Personal history of other infectious and parasitic diseases: Secondary | ICD-10-CM | POA: Diagnosis not present

## 2024-05-24 DIAGNOSIS — E11622 Type 2 diabetes mellitus with other skin ulcer: Secondary | ICD-10-CM | POA: Diagnosis present

## 2024-05-25 ENCOUNTER — Ambulatory Visit: Admitting: Gastroenterology

## 2024-05-26 ENCOUNTER — Telehealth: Payer: Self-pay

## 2024-05-26 NOTE — Telephone Encounter (Signed)
 Moved patient's HF to January 5th with AA, virtual approved. Advised to please go to ED if symptoms worsen, or if appetite diminishes. Pt also requesting DNR, AA notified.

## 2024-05-28 ENCOUNTER — Other Ambulatory Visit: Payer: Self-pay

## 2024-05-28 MED ORDER — ALBUTEROL SULFATE HFA 108 (90 BASE) MCG/ACT IN AERS: 1.0000 | INHALATION_SPRAY | Freq: Four times a day (QID) | RESPIRATORY_TRACT | 3 refills | Status: DC | PRN

## 2024-05-29 ENCOUNTER — Telehealth: Payer: Self-pay | Admitting: Nurse Practitioner

## 2024-05-29 NOTE — Telephone Encounter (Signed)
 Patient's wife lvm @ 11:47 requesting albuterol  refill be sent to pharmacy. She stated rx was requested yesterday and patient having difficulty breathing. I called her back, lvm notifying her rx was sent in yesterday morning around 9:30 to [address] [place]

## 2024-05-31 ENCOUNTER — Other Ambulatory Visit: Payer: Self-pay

## 2024-05-31 ENCOUNTER — Telehealth: Payer: Self-pay

## 2024-05-31 ENCOUNTER — Inpatient Hospital Stay
Admission: EM | Admit: 2024-05-31 | Discharge: 2024-06-07 | DRG: 280 | Disposition: A | Discharge status: Hospice | Attending: Obstetrics and Gynecology | Admitting: Obstetrics and Gynecology

## 2024-05-31 ENCOUNTER — Telehealth: Admitting: Nurse Practitioner

## 2024-05-31 ENCOUNTER — Encounter: Payer: Self-pay | Admitting: Nurse Practitioner

## 2024-05-31 ENCOUNTER — Emergency Department

## 2024-05-31 VITALS — Resp 16 | Ht 73.0 in | Wt 152.0 lb

## 2024-05-31 DIAGNOSIS — D72829 Elevated white blood cell count, unspecified: Secondary | ICD-10-CM | POA: Diagnosis present

## 2024-05-31 DIAGNOSIS — L98A322 Non-pressure chronic ulcer of left hand with fat layer exposed: Secondary | ICD-10-CM | POA: Diagnosis present

## 2024-05-31 DIAGNOSIS — E1142 Type 2 diabetes mellitus with diabetic polyneuropathy: Secondary | ICD-10-CM | POA: Diagnosis present

## 2024-05-31 DIAGNOSIS — T424X5A Adverse effect of benzodiazepines, initial encounter: Secondary | ICD-10-CM | POA: Diagnosis present

## 2024-05-31 DIAGNOSIS — I252 Old myocardial infarction: Secondary | ICD-10-CM

## 2024-05-31 DIAGNOSIS — J439 Emphysema, unspecified: Secondary | ICD-10-CM | POA: Diagnosis not present

## 2024-05-31 DIAGNOSIS — Z860100 Personal history of colon polyps, unspecified: Secondary | ICD-10-CM

## 2024-05-31 DIAGNOSIS — Z8701 Personal history of pneumonia (recurrent): Secondary | ICD-10-CM

## 2024-05-31 DIAGNOSIS — M4622 Osteomyelitis of vertebra, cervical region: Secondary | ICD-10-CM | POA: Diagnosis not present

## 2024-05-31 DIAGNOSIS — R531 Weakness: Secondary | ICD-10-CM | POA: Insufficient documentation

## 2024-05-31 DIAGNOSIS — Z7901 Long term (current) use of anticoagulants: Secondary | ICD-10-CM

## 2024-05-31 DIAGNOSIS — Z8249 Family history of ischemic heart disease and other diseases of the circulatory system: Secondary | ICD-10-CM

## 2024-05-31 DIAGNOSIS — Z794 Long term (current) use of insulin: Secondary | ICD-10-CM

## 2024-05-31 DIAGNOSIS — N186 End stage renal disease: Secondary | ICD-10-CM | POA: Diagnosis not present

## 2024-05-31 DIAGNOSIS — F17210 Nicotine dependence, cigarettes, uncomplicated: Secondary | ICD-10-CM | POA: Diagnosis present

## 2024-05-31 DIAGNOSIS — I444 Left anterior fascicular block: Secondary | ICD-10-CM | POA: Diagnosis present

## 2024-05-31 DIAGNOSIS — Z09 Encounter for follow-up examination after completed treatment for conditions other than malignant neoplasm: Secondary | ICD-10-CM | POA: Diagnosis not present

## 2024-05-31 DIAGNOSIS — G43909 Migraine, unspecified, not intractable, without status migrainosus: Secondary | ICD-10-CM | POA: Diagnosis present

## 2024-05-31 DIAGNOSIS — Z9889 Other specified postprocedural states: Secondary | ICD-10-CM

## 2024-05-31 DIAGNOSIS — F32A Depression, unspecified: Secondary | ICD-10-CM | POA: Diagnosis present

## 2024-05-31 DIAGNOSIS — G928 Other toxic encephalopathy: Secondary | ICD-10-CM | POA: Diagnosis present

## 2024-05-31 DIAGNOSIS — R627 Adult failure to thrive: Secondary | ICD-10-CM | POA: Diagnosis not present

## 2024-05-31 DIAGNOSIS — Z7982 Long term (current) use of aspirin: Secondary | ICD-10-CM

## 2024-05-31 DIAGNOSIS — Z79899 Other long term (current) drug therapy: Secondary | ICD-10-CM

## 2024-05-31 DIAGNOSIS — F419 Anxiety disorder, unspecified: Secondary | ICD-10-CM | POA: Diagnosis present

## 2024-05-31 DIAGNOSIS — M4642 Discitis, unspecified, cervical region: Secondary | ICD-10-CM | POA: Diagnosis present

## 2024-05-31 DIAGNOSIS — Z9841 Cataract extraction status, right eye: Secondary | ICD-10-CM

## 2024-05-31 DIAGNOSIS — D631 Anemia in chronic kidney disease: Secondary | ICD-10-CM | POA: Diagnosis present

## 2024-05-31 DIAGNOSIS — R296 Repeated falls: Secondary | ICD-10-CM | POA: Diagnosis present

## 2024-05-31 DIAGNOSIS — R54 Age-related physical debility: Secondary | ICD-10-CM | POA: Diagnosis present

## 2024-05-31 DIAGNOSIS — Z992 Dependence on renal dialysis: Secondary | ICD-10-CM

## 2024-05-31 DIAGNOSIS — E1122 Type 2 diabetes mellitus with diabetic chronic kidney disease: Secondary | ICD-10-CM | POA: Diagnosis not present

## 2024-05-31 DIAGNOSIS — K219 Gastro-esophageal reflux disease without esophagitis: Secondary | ICD-10-CM | POA: Diagnosis present

## 2024-05-31 DIAGNOSIS — I70203 Unspecified atherosclerosis of native arteries of extremities, bilateral legs: Secondary | ICD-10-CM | POA: Diagnosis present

## 2024-05-31 DIAGNOSIS — I739 Peripheral vascular disease, unspecified: Secondary | ICD-10-CM

## 2024-05-31 DIAGNOSIS — M1990 Unspecified osteoarthritis, unspecified site: Secondary | ICD-10-CM | POA: Diagnosis present

## 2024-05-31 DIAGNOSIS — I5020 Unspecified systolic (congestive) heart failure: Secondary | ICD-10-CM | POA: Diagnosis not present

## 2024-05-31 DIAGNOSIS — Z9181 History of falling: Secondary | ICD-10-CM

## 2024-05-31 DIAGNOSIS — R64 Cachexia: Secondary | ICD-10-CM | POA: Diagnosis present

## 2024-05-31 DIAGNOSIS — I2510 Atherosclerotic heart disease of native coronary artery without angina pectoris: Secondary | ICD-10-CM | POA: Diagnosis present

## 2024-05-31 DIAGNOSIS — I21A1 Myocardial infarction type 2: Principal | ICD-10-CM | POA: Diagnosis present

## 2024-05-31 DIAGNOSIS — Z636 Dependent relative needing care at home: Secondary | ICD-10-CM

## 2024-05-31 DIAGNOSIS — Z91041 Radiographic dye allergy status: Secondary | ICD-10-CM

## 2024-05-31 DIAGNOSIS — L942 Calcinosis cutis: Secondary | ICD-10-CM | POA: Diagnosis present

## 2024-05-31 DIAGNOSIS — I700 Atherosclerosis of aorta: Secondary | ICD-10-CM | POA: Diagnosis present

## 2024-05-31 DIAGNOSIS — L97812 Non-pressure chronic ulcer of other part of right lower leg with fat layer exposed: Secondary | ICD-10-CM | POA: Diagnosis present

## 2024-05-31 DIAGNOSIS — Z789 Other specified health status: Secondary | ICD-10-CM | POA: Diagnosis not present

## 2024-05-31 DIAGNOSIS — E11319 Type 2 diabetes mellitus with unspecified diabetic retinopathy without macular edema: Secondary | ICD-10-CM | POA: Diagnosis present

## 2024-05-31 DIAGNOSIS — E1165 Type 2 diabetes mellitus with hyperglycemia: Secondary | ICD-10-CM | POA: Diagnosis present

## 2024-05-31 DIAGNOSIS — E43 Unspecified severe protein-calorie malnutrition: Secondary | ICD-10-CM | POA: Diagnosis present

## 2024-05-31 DIAGNOSIS — G4733 Obstructive sleep apnea (adult) (pediatric): Secondary | ICD-10-CM | POA: Diagnosis present

## 2024-05-31 DIAGNOSIS — Z7401 Bed confinement status: Secondary | ICD-10-CM

## 2024-05-31 DIAGNOSIS — R291 Meningismus: Secondary | ICD-10-CM | POA: Diagnosis present

## 2024-05-31 DIAGNOSIS — F418 Other specified anxiety disorders: Secondary | ICD-10-CM | POA: Diagnosis present

## 2024-05-31 DIAGNOSIS — Z8711 Personal history of peptic ulcer disease: Secondary | ICD-10-CM

## 2024-05-31 DIAGNOSIS — Z515 Encounter for palliative care: Secondary | ICD-10-CM

## 2024-05-31 DIAGNOSIS — Z792 Long term (current) use of antibiotics: Secondary | ICD-10-CM

## 2024-05-31 DIAGNOSIS — Z951 Presence of aortocoronary bypass graft: Secondary | ICD-10-CM

## 2024-05-31 DIAGNOSIS — K6289 Other specified diseases of anus and rectum: Secondary | ICD-10-CM | POA: Diagnosis present

## 2024-05-31 DIAGNOSIS — I083 Combined rheumatic disorders of mitral, aortic and tricuspid valves: Secondary | ICD-10-CM | POA: Diagnosis present

## 2024-05-31 DIAGNOSIS — I255 Ischemic cardiomyopathy: Secondary | ICD-10-CM | POA: Diagnosis present

## 2024-05-31 DIAGNOSIS — E785 Hyperlipidemia, unspecified: Secondary | ICD-10-CM | POA: Diagnosis present

## 2024-05-31 DIAGNOSIS — J449 Chronic obstructive pulmonary disease, unspecified: Secondary | ICD-10-CM | POA: Diagnosis present

## 2024-05-31 DIAGNOSIS — G894 Chronic pain syndrome: Secondary | ICD-10-CM | POA: Diagnosis present

## 2024-05-31 DIAGNOSIS — I890 Lymphedema, not elsewhere classified: Secondary | ICD-10-CM | POA: Diagnosis present

## 2024-05-31 DIAGNOSIS — I484 Atypical atrial flutter: Secondary | ICD-10-CM | POA: Diagnosis present

## 2024-05-31 DIAGNOSIS — I10 Essential (primary) hypertension: Secondary | ICD-10-CM | POA: Diagnosis present

## 2024-05-31 DIAGNOSIS — I132 Hypertensive heart and chronic kidney disease with heart failure and with stage 5 chronic kidney disease, or end stage renal disease: Secondary | ICD-10-CM | POA: Diagnosis present

## 2024-05-31 DIAGNOSIS — Z9842 Cataract extraction status, left eye: Secondary | ICD-10-CM

## 2024-05-31 DIAGNOSIS — E1151 Type 2 diabetes mellitus with diabetic peripheral angiopathy without gangrene: Secondary | ICD-10-CM | POA: Diagnosis present

## 2024-05-31 DIAGNOSIS — I214 Non-ST elevation (NSTEMI) myocardial infarction: Principal | ICD-10-CM | POA: Diagnosis present

## 2024-05-31 DIAGNOSIS — L97912 Non-pressure chronic ulcer of unspecified part of right lower leg with fat layer exposed: Secondary | ICD-10-CM

## 2024-05-31 DIAGNOSIS — I4430 Unspecified atrioventricular block: Secondary | ICD-10-CM | POA: Diagnosis present

## 2024-05-31 DIAGNOSIS — I4891 Unspecified atrial fibrillation: Secondary | ICD-10-CM | POA: Diagnosis present

## 2024-05-31 DIAGNOSIS — I5022 Chronic systolic (congestive) heart failure: Secondary | ICD-10-CM | POA: Diagnosis present

## 2024-05-31 DIAGNOSIS — Z66 Do not resuscitate: Secondary | ICD-10-CM | POA: Diagnosis present

## 2024-05-31 DIAGNOSIS — Z95828 Presence of other vascular implants and grafts: Secondary | ICD-10-CM

## 2024-05-31 DIAGNOSIS — I350 Nonrheumatic aortic (valve) stenosis: Secondary | ICD-10-CM | POA: Diagnosis present

## 2024-05-31 DIAGNOSIS — Z8616 Personal history of COVID-19: Secondary | ICD-10-CM

## 2024-05-31 DIAGNOSIS — Z6820 Body mass index (BMI) 20.0-20.9, adult: Secondary | ICD-10-CM

## 2024-05-31 DIAGNOSIS — Z833 Family history of diabetes mellitus: Secondary | ICD-10-CM

## 2024-05-31 DIAGNOSIS — E871 Hypo-osmolality and hyponatremia: Secondary | ICD-10-CM | POA: Diagnosis present

## 2024-05-31 DIAGNOSIS — Z888 Allergy status to other drugs, medicaments and biological substances status: Secondary | ICD-10-CM

## 2024-05-31 DIAGNOSIS — Z9861 Coronary angioplasty status: Secondary | ICD-10-CM

## 2024-05-31 LAB — BASIC METABOLIC PANEL WITH GFR
Anion gap: 20 — ABNORMAL HIGH (ref 5–15)
BUN: 53 mg/dL — ABNORMAL HIGH (ref 8–23)
CO2: 23 mmol/L (ref 22–32)
Calcium: 9 mg/dL (ref 8.9–10.3)
Chloride: 87 mmol/L — ABNORMAL LOW (ref 98–111)
Creatinine, Ser: 6.6 mg/dL — ABNORMAL HIGH (ref 0.61–1.24)
GFR, Estimated: 9 mL/min — ABNORMAL LOW
Glucose, Bld: 178 mg/dL — ABNORMAL HIGH (ref 70–99)
Potassium: 3.7 mmol/L (ref 3.5–5.1)
Sodium: 129 mmol/L — ABNORMAL LOW (ref 135–145)

## 2024-05-31 LAB — CBC
HCT: 37.8 % — ABNORMAL LOW (ref 39.0–52.0)
Hemoglobin: 13.1 g/dL (ref 13.0–17.0)
MCH: 31.9 pg (ref 26.0–34.0)
MCHC: 34.7 g/dL (ref 30.0–36.0)
MCV: 92 fL (ref 80.0–100.0)
Platelets: 246 K/uL (ref 150–400)
RBC: 4.11 MIL/uL — ABNORMAL LOW (ref 4.22–5.81)
RDW: 15.1 % (ref 11.5–15.5)
WBC: 11.9 K/uL — ABNORMAL HIGH (ref 4.0–10.5)
nRBC: 0.2 % (ref 0.0–0.2)

## 2024-05-31 LAB — TROPONIN T, HIGH SENSITIVITY
Troponin T High Sensitivity: 639 ng/L (ref 0–19)
Troponin T High Sensitivity: 689 ng/L (ref 0–19)

## 2024-05-31 LAB — PROTIME-INR
INR: 1.2 (ref 0.8–1.2)
Prothrombin Time: 15.4 s — ABNORMAL HIGH (ref 11.4–15.2)

## 2024-05-31 LAB — PRO BRAIN NATRIURETIC PEPTIDE: Pro Brain Natriuretic Peptide: >35000 pg/mL — ABNORMAL HIGH

## 2024-05-31 LAB — MAGNESIUM: Magnesium: 2.1 mg/dL (ref 1.7–2.4)

## 2024-05-31 LAB — APTT: aPTT: 32 s (ref 24–36)

## 2024-05-31 LAB — CBG MONITORING, ED: Glucose-Capillary: 189 mg/dL — ABNORMAL HIGH (ref 70–99)

## 2024-05-31 MED ORDER — TORSEMIDE 20 MG PO TABS
20.0000 mg | ORAL_TABLET | Freq: Two times a day (BID) | ORAL | Status: DC
  Administered 2024-06-01 – 2024-06-04 (×7): 20 mg via ORAL
  Filled 2024-05-31 (×6): qty 1

## 2024-05-31 MED ORDER — INSULIN ASPART 100 UNIT/ML IJ SOLN
0.0000 [IU] | INTRAMUSCULAR | Status: DC
  Administered 2024-06-01: 0 [IU] via SUBCUTANEOUS
  Administered 2024-06-02: 3 [IU] via SUBCUTANEOUS
  Administered 2024-06-02: 1 [IU] via SUBCUTANEOUS
  Administered 2024-06-02: 2 [IU] via SUBCUTANEOUS
  Administered 2024-06-03: 1 [IU] via SUBCUTANEOUS
  Filled 2024-05-31 (×3): qty 1
  Filled 2024-05-31: qty 2
  Filled 2024-05-31: qty 1
  Filled 2024-05-31: qty 3

## 2024-05-31 MED ORDER — HEPARIN (PORCINE) 25000 UT/250ML-% IV SOLN
1450.0000 [IU]/h | INTRAVENOUS | Status: DC
  Administered 2024-05-31: 850 [IU]/h via INTRAVENOUS
  Administered 2024-06-01: 1100 [IU]/h via INTRAVENOUS
  Administered 2024-06-02: 1300 [IU]/h via INTRAVENOUS
  Filled 2024-05-31 (×3): qty 250

## 2024-05-31 MED ORDER — EZETIMIBE 10 MG PO TABS
10.0000 mg | ORAL_TABLET | Freq: Every day | ORAL | Status: DC
  Administered 2024-06-01 – 2024-06-03 (×3): 10 mg via ORAL
  Filled 2024-05-31 (×3): qty 1

## 2024-05-31 MED ORDER — ONDANSETRON HCL 4 MG/2ML IJ SOLN
4.0000 mg | Freq: Four times a day (QID) | INTRAMUSCULAR | Status: DC | PRN
  Administered 2024-06-04: 4 mg via INTRAVENOUS
  Filled 2024-05-31: qty 2

## 2024-05-31 MED ORDER — HEPARIN SODIUM (PORCINE) 5000 UNIT/ML IJ SOLN: 4000.0000 [IU] | Freq: Once | INTRAMUSCULAR | Status: DC

## 2024-05-31 MED ORDER — CARVEDILOL 3.125 MG PO TABS
3.1250 mg | ORAL_TABLET | Freq: Two times a day (BID) | ORAL | Status: DC
  Administered 2024-06-01 – 2024-06-07 (×12): 3.125 mg via ORAL
  Filled 2024-05-31 (×12): qty 1

## 2024-05-31 MED ORDER — HYDRALAZINE HCL 50 MG PO TABS
100.0000 mg | ORAL_TABLET | Freq: Two times a day (BID) | ORAL | Status: DC
  Administered 2024-06-01 – 2024-06-07 (×13): 100 mg via ORAL
  Filled 2024-05-31 (×14): qty 2

## 2024-05-31 MED ORDER — ASPIRIN 81 MG PO TBEC
81.0000 mg | DELAYED_RELEASE_TABLET | Freq: Every day | ORAL | Status: DC
  Administered 2024-06-01 – 2024-06-03 (×3): 81 mg via ORAL
  Filled 2024-05-31 (×3): qty 1

## 2024-05-31 MED ORDER — AMLODIPINE BESYLATE 10 MG PO TABS
10.0000 mg | ORAL_TABLET | Freq: Every day | ORAL | Status: DC
  Administered 2024-06-01 – 2024-06-02 (×2): 10 mg via ORAL
  Filled 2024-05-31: qty 2
  Filled 2024-05-31: qty 1

## 2024-05-31 MED ORDER — ASPIRIN 81 MG PO TBEC: 81.0000 mg | DELAYED_RELEASE_TABLET | Freq: Every day | ORAL | Status: DC

## 2024-05-31 MED ORDER — ASPIRIN 81 MG PO CHEW
324.0000 mg | CHEWABLE_TABLET | Freq: Once | ORAL | Status: AC
  Administered 2024-05-31: 324 mg via ORAL
  Filled 2024-05-31: qty 4

## 2024-05-31 MED ORDER — FLUCONAZOLE 100 MG PO TABS
200.0000 mg | ORAL_TABLET | Freq: Every day | ORAL | Status: AC
  Administered 2024-06-01 – 2024-06-02 (×2): 200 mg via ORAL
  Filled 2024-05-31 (×2): qty 2

## 2024-05-31 MED ORDER — HEPARIN BOLUS VIA INFUSION
4000.0000 [IU] | Freq: Once | INTRAVENOUS | Status: AC
  Administered 2024-05-31: 4000 [IU] via INTRAVENOUS
  Filled 2024-05-31: qty 4000

## 2024-05-31 MED ORDER — ALPRAZOLAM 0.5 MG PO TABS
0.5000 mg | ORAL_TABLET | Freq: Two times a day (BID) | ORAL | Status: DC | PRN
  Administered 2024-06-01 – 2024-06-05 (×5): 0.5 mg via ORAL
  Filled 2024-05-31 (×5): qty 1

## 2024-05-31 MED ORDER — ALBUTEROL SULFATE (2.5 MG/3ML) 0.083% IN NEBU: 2.5000 mg | INHALATION_SOLUTION | Freq: Four times a day (QID) | RESPIRATORY_TRACT | Status: DC | PRN

## 2024-05-31 MED ORDER — ACETAMINOPHEN 325 MG PO TABS: 650.0000 mg | ORAL_TABLET | ORAL | Status: DC | PRN

## 2024-05-31 MED ORDER — OXYCODONE HCL 5 MG PO TABS
5.0000 mg | ORAL_TABLET | Freq: Two times a day (BID) | ORAL | Status: DC | PRN
  Administered 2024-06-01 – 2024-06-05 (×5): 5 mg via ORAL
  Filled 2024-05-31 (×6): qty 1

## 2024-05-31 MED ORDER — NITROGLYCERIN 0.4 MG SL SUBL: 0.4000 mg | SUBLINGUAL_TABLET | SUBLINGUAL | Status: DC | PRN

## 2024-05-31 NOTE — Assessment & Plan Note (Signed)
Continue as needed oxycodone 

## 2024-05-31 NOTE — Assessment & Plan Note (Signed)
Continue basal insulin with sliding scale coverage

## 2024-05-31 NOTE — Assessment & Plan Note (Addendum)
 EKG with concern of new onset atrial fibrillation/flutter, CHA2DS2-VASc at least 5 - Likely not a good candidate for DCCV but cardiology will decide -Continue with heparin  infusion for now, cardiology to decide about continuing anticoagulation based on his significant comorbidities and poor underlying functional status -Continue to monitor -Continue with Coreg 

## 2024-05-31 NOTE — Assessment & Plan Note (Addendum)
 Patient with mild multiple wounds, followed at wound care center Continue pain control Continue wound care

## 2024-05-31 NOTE — Progress Notes (Signed)
 [facility] [place], [place] [address] [place] [address] [phone]                                   Transitional Care Clinic   [facility] Discharge Acute Issues Care Follow Up                                                                        Patient Demographics  Tricia Oaxaca, is a 64 y.o. male  DOB [birth year]  MRN [id number].  Primary MD  Liana Fish, NP  Admit date: 05/18/2024 Discharge date: 05/19/2024  Reason for TCC follow Up - bacteremia    Past Medical History:  Diagnosis Date   Anemia    Arthritis    CAD (coronary artery disease)    a. 12/2016 s/p CABG x 4 (LIMA->LAD, VG->RCA, VG->OM1, VG->D1); b. 02/2018 MV: EF 35%, small-med inferolaterlal infarct. No ischemia.   CKD (chronic kidney disease), stage IV [facility])    Peritoneal Dialysis   Colon polyps    COPD (chronic obstructive pulmonary disease) (HCC)    Dupre's syndrome    GERD (gastroesophageal reflux disease)    HFrEF (heart failure reduced ejection fraction) (HCC)    a.) 2018 EF 35%; b.) 02/2018 EF 50%; c). 02/2019 EF 40-45%; d.) 08/2019 Echo: EF 50-55%, no rwma, GrII DD; e.) 04/2020 Echo: EF 30-35%, mod-sev glob HK. Mild LVH. G2DD. Low-nl RV fxn. Mildly dil LA. Mild MR. Mild-mod Ao sclerosis w/o stenosis; f.) TTE 02/09/2021: EF 55%; septal HK, LAE; G2DD.   History of 2019 novel coronavirus disease (COVID-19) 02/08/2021   Hyperlipidemia    Hypertension    Ischemic cardiomyopathy    a.) 02/2018 MV: EF 35%; b.) 08/2019 Echo: EF 50-55%; c.) 04/2020 Echo: EF 30-35%; d.) TTE 02/09/2021: EF 50-55%.   Lymphedema    Migraine    Myocardial infarction (HCC)    Neuropathy    PAD (peripheral artery disease)    a. 04/2017 Aortoiliac duplex: R iliac dzs; b. 03/2020 LE Duplex: mild bilat atherosclerosis throughout. Patent vessels.   Pneumonia    Retinopathy    S/P CABG x 4    a.)  4v CABG: LIMA->LAD, SVG->RCA, SVG->OM1, SVG->D1   Sleep apnea    a.) does not require  nocturnal PAP therapy   Statin intolerance    Stomach ulcer    T2DM (type 2 diabetes mellitus) (HCC)     Past Surgical History:  Procedure Laterality Date   bone graft surgery Bilateral    on both feet x 2   CAPD INSERTION N/A 11/13/2021   Procedure: LAPAROSCOPIC INSERTION CONTINUOUS AMBULATORY PERITONEAL DIALYSIS  (CAPD) CATHETER;  Surgeon: Jordis Laneta FALCON, MD;  Location: [facility] ORS;  Service: General;  Laterality: N/A;   CAPD REVISION N/A 01/10/2022   Procedure: LAPAROSCOPIC REVISION CONTINUOUS AMBULATORY PERITONEAL DIALYSIS  (CAPD) CATHETER;  Surgeon: Jordis Laneta FALCON, MD;  Location: [facility] ORS;  Service: General;  Laterality: N/A;   CARDIAC CATHETERIZATION  2013   S/p PCI    CARDIAC CATHETERIZATION  2018   S/p CABG   CATARACT EXTRACTION Bilateral    COLONOSCOPY     COLONOSCOPY WITH PROPOFOL  N/A 10/17/2022  Procedure: COLONOSCOPY WITH PROPOFOL ;  Surgeon: Jinny Carmine, MD;  Location: [facility] ENDOSCOPY;  Service: Endoscopy;  Laterality: N/A;   CORONARY ARTERY BYPASS GRAFT  2018   (LIMA-LAD,VG-RCA,VG-OM1,VG-D1)   ESOPHAGOGASTRODUODENOSCOPY (EGD) WITH PROPOFOL  N/A 04/04/2020   Procedure: ESOPHAGOGASTRODUODENOSCOPY (EGD) WITH PROPOFOL ;  Surgeon: Jinny Carmine, MD;  Location: [facility] ENDOSCOPY;  Service: Endoscopy;  Laterality: N/A;   ESOPHAGOGASTRODUODENOSCOPY (EGD) WITH PROPOFOL  N/A 06/06/2020   Procedure: ESOPHAGOGASTRODUODENOSCOPY (EGD) WITH PROPOFOL ;  Surgeon: Jinny Carmine, MD;  Location: [facility] ENDOSCOPY;  Service: Endoscopy;  Laterality: N/A;   ESOPHAGOGASTRODUODENOSCOPY (EGD) WITH PROPOFOL  N/A 09/24/2022   Procedure: ESOPHAGOGASTRODUODENOSCOPY (EGD) WITH PROPOFOL ;  Surgeon: Jinny Carmine, MD;  Location: [facility] ENDOSCOPY;  Service: Endoscopy;  Laterality: N/A;   INCISION AND DRAINAGE OF WOUND Left 04/23/2024   Procedure: IRRIGATION AND DEBRIDEMENT WOUND;  Surgeon: Gust Molly, DO;  Location: [facility] ORS;  Service: Orthopedics;  Laterality: Left;  LEFT WRIST DEBRIDEMENT OF WOUND   PERIPHERAL VASCULAR  CATHETERIZATION Right 10/28/2016   PTA/DEB Right SFA   WRIST SURGERY Left    cyst       Recent HPI and Hospital Course  History of present illness:  Ezechiel Stooksbury is a pleasant 64 y.o. 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  Patient stated that he has a right leg wound began approximately 4 days ago which has progressively gotten worse.  He went to wound clinicwhere he was advised to go to the emergency room as the wound is bad and needs emergent evaluation.  He reports compliance with all the medications prescribed. He complains of severe pain 8/10 in intensity but does not want more than his prescribed medication oxycodone , getting worse to the point that he is not able to handle that at home, denies any fever, chills, nausea, vomiting, diarrhea, abdominal pain, chest pain, shortness of breath, palpitations.     Hospital Course:    Patient presents referred from wound care clinic for lower extremity wounds. These are chronic and progressive. Patient was seen here by ID, vascular surgery, and nephrology. 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 ID does not think they are infected, and regardless patient is currently on ceftriaxone /daptomycin  plus fluconazole  for recent discitis and superficial swab positive for candida. ID did not advise change in abx, patient will f/u with Dr. Fayette as scheduled. Vascular surgery says circulation is adequate and did not advise vascular intervention. Patient is in discussion with nephrology about switching dialysis to hemodialysis (currently on peritoneal) to help treat the calciphylaxis - patient is contemplating this. Discharged then with plan to f/u with nephrology to continue those discussions, continue with  the outpatient wound care center, and continue home health.    --requesting Wheelchair prescription, motorized  --DNR request --respiratory infection --lack of appetite -- dropped down to 69 kg since early December.  -- falls x7 since mid November,  --Self care deficit, trouble showering  --requesting hospice consult.   Post Hospital Acute Care Issue to be followed in the Clinic   Wound infection Active Problems:   Chronic leg and foot pain (Bilateral)   Osteomyelitis of cervical spine (HCC)   Diabetes mellitus with complication, with long-term current use of insulin  (HCC)   Essential hypertension   ESRD on peritoneal dialysis (HCC)   HLD (hyperlipidemia)   CAD (coronary artery  disease)   COPD (chronic obstructive pulmonary disease) (HCC)   Depression with anxiety   Sleep apnea   Hx of CABG   Gastroesophageal reflux disease without esophagitis   Wound of right leg   HFrEF (heart failure with reduced ejection fraction) (HCC)     Subjective:   Barton Bolder today has, No headache, No chest pain, No abdominal pain - No Nausea, No new weakness tingling or numbness, No Cough - SOB.   Assessment & Plan   1. Failure to thrive in adult (Primary) Discussed DNR is detail. He and his wife will come to the office tomorrow to pick up the order. Referred to hospice for evaluation via [facility]. Electric wheelchair ordered per patient request due to generalized weakness, failure to thrive, extreme fatigue and self care deficit.  - Ambulatory referral to Hospice - DME Wheelchair electric - Do not attempt resuscitation (DNR)  2. Osteomyelitis of cervical spine (HCC) Referred to hospice and code status changed to DNR - Ambulatory referral to Hospice - DME Wheelchair electric - Do not attempt resuscitation (DNR)  3. ESRD (end stage renal disease) on dialysis [facility]) Referred to hospice and code status changed to DNR - Ambulatory referral to Hospice - DME Wheelchair electric - Do  not attempt resuscitation (DNR)  4. HFrEF (heart failure with reduced ejection fraction) (HCC) Referred to hospice and code status changed to DNR - Ambulatory referral to Hospice - DME Wheelchair electric - Do not attempt resuscitation (DNR)  5. Type 2 diabetes mellitus with chronic kidney disease on chronic dialysis, with long-term current use of insulin  (HCC) Referred to hospice and code status changed to DNR - Ambulatory referral to Hospice - DME Wheelchair electric - Do not attempt resuscitation (DNR)  6. Chronic obstructive pulmonary disease with emphysema, unspecified emphysema type (HCC) Referred to hospice and code status changed to DNR - Ambulatory referral to Hospice - DME Wheelchair electric - Do not attempt resuscitation (DNR)  7. PAD (peripheral artery disease) Referred to hospice and code status changed to DNR - Ambulatory referral to Hospice - DME Wheelchair electric - Do not attempt resuscitation (DNR)  8. Generalized weakness Discussed DNR is detail. He and his wife will come to the office tomorrow to pick up the order. Referred to hospice for evaluation via [facility]. Electric wheelchair ordered per patient request due to generalized weakness, failure to thrive, extreme fatigue and self care deficit.  - Ambulatory referral to Hospice - DME Wheelchair electric - Do not attempt resuscitation (DNR)  9. Hospital discharge follow-up Discussed DNR is detail. He and his wife will come to the office tomorrow to pick up the order. Referred to hospice for evaluation via [facility]. Electric wheelchair ordered per patient request due to generalized weakness, failure to thrive, extreme fatigue and self care deficit.  - Ambulatory referral to Hospice - DME Wheelchair electric - Do not attempt resuscitation (DNR)  10. Self-care deficit Discussed DNR is detail. He and his wife will come to the office tomorrow to pick up the order. Referred to hospice for evaluation via  [facility]. Electric wheelchair ordered per patient request due to generalized weakness, failure to thrive, extreme fatigue and self care deficit.   11. Multiple falls Referred to hospice and code status changed to DNR  12. Caregiver stress Referred to hospice and code status changed to DNR   Reason for frequent admissions/ER visits    Chronic leg and foot pain (Bilateral)   Osteomyelitis of cervical spine (HCC)   Diabetes mellitus with complication,  with long-term current use of insulin  (HCC)   Essential hypertension   ESRD on peritoneal dialysis (HCC)   HLD (hyperlipidemia)   CAD (coronary artery disease)   COPD (chronic obstructive pulmonary disease) (HCC)   Depression with anxiety   Sleep apnea   Hx of CABG   Gastroesophageal reflux disease without esophagitis   Wound of right leg   HFrEF (heart failure with reduced ejection fraction) (HCC)   Objective:   Vitals:   05/31/24 0854  Resp: 16  Weight: 152 lb (68.9 kg)  Height: 6' 1 (1.854 m)    Wt Readings from Last 3 Encounters:  05/31/24 152 lb (68.9 kg)  05/17/24 163 lb 5.8 oz (74.1 kg)  04/27/24 185 lb 6.5 oz (84.1 kg)    Allergies as of 05/31/2024       Reactions   Iodinated Contrast Media Other (See Comments)   Kidney disease  Kidney disease  Kidney disease  Kidney disease   Other Other (See Comments)   Pain  With joint stiffness    Statins Other (See Comments)   Pain  With joint stiffness  Pain  With joint stiffness Other Reaction(s): Joint Pain   Pregabalin Other (See Comments)   Generalized aches and pains Generalized aches and pains Generalized aches and pains Generalized aches and pains Generalized aches and pains   Sacubitril-valsartan Other (See Comments)   Hyperkalemia Hyperkalemia Hyperkalemia  Hyperkalemia   Atorvastatin Other (See Comments)   Muscle aches Muscle aches   Pravastatin Other (See Comments)   Muscle aches Muscle aches   Rosuvastatin Other (See Comments)   Muscle  Aches Other reaction(s): JOINT PAIN Other reaction(s): JOINT PAIN Muscle Aches        Medication List        Accurate as of May 31, 2024  1:28 PM. If you have any questions, ask your nurse or doctor.          albuterol  108 (90 Base) MCG/ACT inhaler Commonly known as: VENTOLIN  HFA Inhale 1-2 puffs into the lungs every 6 (six) hours as needed for wheezing or shortness of breath.   ALPRAZolam  0.5 MG tablet Commonly known as: XANAX  Take 1 tablet (0.5 mg total) by mouth 2 (two) times daily as needed for anxiety.   amLODipine  10 MG tablet Commonly known as: NORVASC  Take 1 tablet (10 mg total) by mouth daily.   aspirin  EC 81 MG tablet Take 81 mg by mouth daily. Swallow whole.   B-D ULTRAFINE III SHORT PEN 31G X 8 MM Misc Generic drug: Insulin  Pen Needle To use with pen basal and mealtime coverage insulin  pens 5 times daily. DX: E11.65   bisacodyl  5 MG EC tablet Commonly known as: DULCOLAX Take 10 mg by mouth 2 (two) times daily as needed for moderate constipation.   carvedilol  3.125 MG tablet Commonly known as: COREG  Take 1 tablet (3.125 mg total) by mouth 2 (two) times daily with a meal.   cholecalciferol  25 MCG (1000 UNIT) tablet Commonly known as: VITAMIN D3 Take 2,000 Units by mouth 2 (two) times daily.   cloNIDine  0.1 MG tablet Commonly known as: Catapres  Take 1 tablet (0.1 mg total) by mouth 2 (two) times daily as needed (pressure >175).   ezetimibe  10 MG tablet Commonly known as: ZETIA  Take 1 tablet (10 mg total) by mouth daily.   fluconazole  200 MG tablet Commonly known as: DIFLUCAN  Take 1 tablet (200 mg total) by mouth daily.   gentamicin  cream 0.1 % Commonly known as: GARAMYCIN  Apply 1  Application topically 3 (three) times daily.   HumaLOG  Mix 75/25 KwikPen (75-25) 100 UNIT/ML KwikPen Generic drug: Insulin  Lispro Prot & Lispro Inject 20 Units into the skin every morning. 75-25 pen injectior   hydrALAZINE  100 MG tablet Commonly known as:  APRESOLINE  Take 1 tablet (100 mg total) by mouth 3 (three) times daily. What changed: when to take this   insulin  lispro 100 UNIT/ML KwikPen Commonly known as: HUMALOG  Inject 0-15 Units into the skin 3 (three) times daily.   isosorbide  mononitrate 30 MG 24 hr tablet Commonly known as: IMDUR  Take 1 tablet (30 mg total) by mouth 2 (two) times daily.   nicotine  21 mg/24hr patch Commonly known as: NICODERM CQ  - dosed in mg/24 hours Place 1 patch (21 mg total) onto the skin daily.   nitroGLYCERIN  0.4 MG SL tablet Commonly known as: NITROSTAT  Place 1 tablet (0.4 mg total) under the tongue every 5 (five) minutes as needed for chest pain.   nortriptyline  50 MG capsule Commonly known as: PAMELOR  Take 50 mg by mouth at bedtime.   nystatin  ointment Commonly known as: MYCOSTATIN  Apply 1 Application topically daily at 12 noon.   oxyCODONE  5 MG immediate release tablet Commonly known as: Oxy IR/ROXICODONE  Take 1 tablet (5 mg total) by mouth 2 (two) times daily as needed for severe pain (pain score 7-10).   pantoprazole  40 MG tablet Commonly known as: PROTONIX  Take 1 tablet (40 mg total) by mouth 2 (two) times daily before a meal.   Repatha  SureClick 140 MG/ML Soaj Generic drug: Evolocumab  INJECT 1 ML UNDER THE SKIN EVERY 14 DAYS   torsemide  20 MG tablet Commonly known as: DEMADEX  Take 1 tablet (20 mg total) by mouth 2 (two) times daily. What changed:  how much to take additional instructions   Toujeo  Max SoloStar 300 UNIT/ML Solostar Pen Generic drug: insulin  glargine (2 Unit Dial ) Inject 60 Units into the skin daily.               Durable Medical Equipment  (From admission, onward)           Start     Ordered   05/31/24 0000  DME Wheelchair electric        05/31/24 0932             Physical Exam: Objective data: He is disheveled and unkempt, appears to be falling asleep in the video feed. He will talk and trail off and has difficulty finishing his  thoughts and sentences when speaking.    Data Review   Micro Results No results found for this or any previous visit (from the past 240 hours).   CBC No results for input(s): WBC, HGB, HCT, PLT, MCV, MCH, MCHC, RDW, LYMPHSABS, MONOABS, EOSABS, BASOSABS, BANDABS in the last 168 hours.  Invalid input(s): NEUTRABS, BANDSABD  Chemistries  No results for input(s): NA, K, CL, CO2, GLUCOSE, BUN, CREATININE, CALCIUM , MG, AST, ALT, ALKPHOS, BILITOT in the last 168 hours.  Invalid input(s): GFRCGP ------------------------------------------------------------------------------------------------------------------ estimated creatinine clearance is 10.1 mL/min (A) (by C-G formula based on SCr of 7.14 mg/dL (H)). ------------------------------------------------------------------------------------------------------------------ No results for input(s): HGBA1C in the last 72 hours. ------------------------------------------------------------------------------------------------------------------ No results for input(s): CHOL, HDL, LDLCALC, TRIG, CHOLHDL, LDLDIRECT in the last 72 hours. ------------------------------------------------------------------------------------------------------------------ No results for input(s): TSH, T4TOTAL, T3FREE, THYROIDAB in the last 72 hours.  Invalid input(s): FREET3 ------------------------------------------------------------------------------------------------------------------ No results for input(s): VITAMINB12, FOLATE, FERRITIN, TIBC, IRON, RETICCTPCT in the last 72 hours.  Coagulation profile No results for input(s): INR, PROTIME in  the last 168 hours.  No results for input(s): DDIMER in the last 72 hours.  Cardiac Enzymes No results for input(s): CKMB, TROPONINI, MYOGLOBIN in the last 168 hours.  Invalid input(s):  CK ------------------------------------------------------------------------------------------------------------------ Invalid input(s): POCBNP  Return if symptoms worsen or fail to improve, for patient referred to hospice. .   Time Spent in minutes  45 Time spent with patient included reviewing progress notes, labs, imaging studies, and discussing plan for follow up.    This patient was seen by Mardy Maxin, FNP-C in collaboration with Dr. Sigrid Bathe as a part of collaborative care agreement.    Mardy Maxin MSN, FNP-C on 05/31/2024 at 9:17 AM   **Disclaimer: This note may have been dictated with voice recognition software. Similar sounding words can inadvertently be transcribed and this note may contain transcription errors which may not have been corrected upon publication of note.**

## 2024-05-31 NOTE — ED Notes (Signed)
 Report received, pt moved into hospital bed, oriented to room, denies any needs at this time, call bell within reach, bed in lowest locked position

## 2024-05-31 NOTE — Assessment & Plan Note (Addendum)
 On current long-term antibiotics for discitis treatment  Continue ceftriaxone , daptomycin  and fluconazole  via PICC line - Patient just had 1 more day of antibiotics left per ID pharmacy

## 2024-05-31 NOTE — Assessment & Plan Note (Signed)
"-   Continue home meds  "

## 2024-05-31 NOTE — Consult Note (Signed)
 Pharmacy Consult Note - Anticoagulation  Pharmacy Consult for heparin  infusion Indication: chest pain/ACS Allergies[1]  PATIENT MEASUREMENTS: Height: 6' 1 (185.4 cm) Weight: 68.9 kg (152 lb) IBW/kg (Calculated) : 79.9 HEPARIN  DW (KG): 68.9  VITAL SIGNS: Temp: 97.7 F (36.5 C) (01/05 1810) BP: 139/86 (01/05 1810) Pulse Rate: 105 (01/05 1810)  Recent Labs    05/31/24 1816 05/31/24 1900  HGB 13.1  --   HCT 37.8*  --   PLT 246  --   CREATININE  --  6.60*    Estimated Creatinine Clearance: 10.9 mL/min (A) (by C-G formula based on SCr of 6.6 mg/dL (H)).  PAST MEDICAL HISTORY: Past Medical History:  Diagnosis Date   Anemia    Arthritis    CAD (coronary artery disease)    a. 12/2016 s/p CABG x 4 (LIMA->LAD, VG->RCA, VG->OM1, VG->D1); b. 02/2018 MV: EF 35%, small-med inferolaterlal infarct. No ischemia.   CKD (chronic kidney disease), stage IV [facility])    Peritoneal Dialysis   Colon polyps    COPD (chronic obstructive pulmonary disease) (HCC)    Dupre's syndrome    GERD (gastroesophageal reflux disease)    HFrEF (heart failure reduced ejection fraction) (HCC)    a.) 2018 EF 35%; b.) 02/2018 EF 50%; c). 02/2019 EF 40-45%; d.) 08/2019 Echo: EF 50-55%, no rwma, GrII DD; e.) 04/2020 Echo: EF 30-35%, mod-sev glob HK. Mild LVH. G2DD. Low-nl RV fxn. Mildly dil LA. Mild MR. Mild-mod Ao sclerosis w/o stenosis; f.) TTE 02/09/2021: EF 55%; septal HK, LAE; G2DD.   History of 2019 novel coronavirus disease (COVID-19) 02/08/2021   Hyperlipidemia    Hypertension    Ischemic cardiomyopathy    a.) 02/2018 MV: EF 35%; b.) 08/2019 Echo: EF 50-55%; c.) 04/2020 Echo: EF 30-35%; d.) TTE 02/09/2021: EF 50-55%.   Lymphedema    Migraine    Myocardial infarction (HCC)    Neuropathy    PAD (peripheral artery disease)    a. 04/2017 Aortoiliac duplex: R iliac dzs; b. 03/2020 LE Duplex: mild bilat atherosclerosis throughout. Patent vessels.   Pneumonia    Retinopathy    S/P CABG x 4    a.)  4v CABG:  LIMA->LAD, SVG->RCA, SVG->OM1, SVG->D1   Sleep apnea    a.) does not require nocturnal PAP therapy   Statin intolerance    Stomach ulcer    T2DM (type 2 diabetes mellitus) (HCC)     Medications:  (Not in a hospital admission)  Scheduled:   heparin   4,000 Units Intravenous Once   Infusions:   heparin      PRN:  Anti-infectives (From admission, onward)    None       ASSESSMENT: 64 y.o. 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 Troponin elevated at 639 on admission. EKG on admission showing aflutter with AV block and prolonged QT. Patient is not on chronic anticoagulation per chart review. Pharmacy has been consulted to initiate and manage heparin  intravenous infusion.   Goal(s) of therapy: Heparin  level 0.3 - 0.7 units/mL aPTT 66 - 102 seconds Monitor platelets by anticoagulation protocol: Yes   Baseline anticoagulation labs: Recent Labs    05/31/24 1816  HGB 13.1  PLT 246  Baseline INR and aPTT pending  PLAN:  Give 4,000 units bolus x1; then start heparin  infusion at 850 units/hour.  Check heparin  level in 8 hours, then daily once at least two  levels are consecutively therapeutic.  Monitor CBC daily while on heparin  infusion.   Annabella LOISE Banks, PharmD Clinical Pharmacist 05/31/2024 8:03 PM     [1]  Allergies Allergen Reactions   Iodinated Contrast Media Other (See Comments)    Kidney disease  Kidney disease  Kidney disease  Kidney disease   Other Other (See Comments)    Pain  With joint stiffness     Statins Other (See Comments)    Pain  With joint stiffness   Pain  With joint stiffness  Other Reaction(s): Joint Pain   Pregabalin Other (See Comments)    Generalized aches and pains Generalized aches and pains Generalized aches and pains Generalized aches and pains Generalized aches and pains    Sacubitril-Valsartan Other (See Comments)    Hyperkalemia  Hyperkalemia Hyperkalemia  Hyperkalemia   Atorvastatin Other (See Comments)    Muscle aches Muscle aches   Pravastatin Other (See Comments)    Muscle aches Muscle aches   Rosuvastatin Other (See Comments)    Muscle Aches Other reaction(s): JOINT PAIN Other reaction(s): JOINT PAIN Muscle Aches

## 2024-05-31 NOTE — Assessment & Plan Note (Addendum)
 Clinically euvolemic, repeat echocardiogram with further decrease of EF to 20 to 25%. Continue GDMT

## 2024-05-31 NOTE — H&P (Signed)
 " History and Physical    Patient: [name] [name] FMW:[id number] DOB: 01-05-1961 DOA: 05/31/2024 DOS: the patient was seen and examined on 05/31/2024 PCP: Liana Fish, NP  Patient coming from: Home  Chief Complaint:  Chief Complaint  Patient presents with   Shortness of Breath    HPI: [name] is a 64 y.o. 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 His wife described an episode of worsening shortness of breath for which patient hunched over and was rocking back and forth and hurting all over.  Has had prior similar episodes over the past several weeks but this one was more intense.   History of a nonischemic stress test November 2024 With EMS patient was in A-fib.  Shortness of breath was resolved by arrival. In the ED, mild tachycardia to 105 with otherwise normal vitals Labs notable for  -troponin T of 639 with BNP> 35,000 -WBC 11.9 -Renal function at baseline for ESRD - Hyponatremia of 129 - Magnesium 2.1 and potassium 3.4  EKG showed NSR at 88 (though EKG read as a flutter) and nonspecific ST-T wave changes  Chest x-ray nonacute  Patient treated with chewable aspirin  and started on a heparin  infusion Admission requested    Past Medical History:  Diagnosis Date   Anemia    Arthritis    CAD (coronary artery disease)    a. 12/2016 s/p CABG x 4 (LIMA->LAD, VG->RCA, VG->OM1, VG->D1); b. 02/2018 MV: EF 35%, small-med inferolaterlal infarct. No ischemia.   CKD (chronic kidney disease), stage  [facility])    Peritoneal Dialysis   Colon polyps    COPD (chronic obstructive pulmonary disease) (HCC)    Dupre's syndrome    GERD (gastroesophageal reflux disease)    HFrEF (heart failure reduced ejection fraction) (HCC)    a.) 2018 EF 35%; b.) 02/2018 EF 50%; c). 02/2019 EF 40-45%; d.) 08/2019 Echo: EF 50-55%, no rwma, GrII DD; e.) 04/2020 Echo: EF 30-35%, mod-sev glob HK. Mild LVH. G2DD. Low-nl RV fxn. Mildly dil LA. Mild MR. Mild-mod Ao sclerosis w/o stenosis; f.) TTE 02/09/2021: EF 55%; septal HK, LAE; G2DD.   History of 2019 novel coronavirus disease (COVID-19) 02/08/2021   Hyperlipidemia    Hypertension    Ischemic cardiomyopathy    a.) 02/2018 MV: EF 35%; b.) 08/2019 Echo: EF 50-55%; c.) 04/2020 Echo: EF 30-35%; d.) TTE 02/09/2021: EF 50-55%.   Lymphedema    Migraine    Myocardial infarction (HCC)    Neuropathy    PAD (peripheral artery disease)    a. 04/2017 Aortoiliac duplex: R iliac dzs; b. 03/2020 LE Duplex: mild bilat atherosclerosis throughout. Patent vessels.   Pneumonia    Retinopathy    S/P CABG x 4    a.)  4v CABG: LIMA->LAD, SVG->RCA, SVG->OM1, SVG->D1   Sleep apnea    a.) does not require nocturnal PAP therapy   Statin intolerance    Stomach ulcer    T2DM (type 2 diabetes mellitus) (HCC)    Past Surgical History:  Procedure Laterality Date   bone graft surgery Bilateral    on  both feet x 2   CAPD INSERTION N/A 11/13/2021   Procedure: LAPAROSCOPIC INSERTION CONTINUOUS AMBULATORY PERITONEAL DIALYSIS  (CAPD) CATHETER;  Surgeon: Jordis Laneta FALCON, MD;  Location: [facility] ORS;  Service: General;  Laterality: N/A;   CAPD REVISION N/A 01/10/2022   Procedure: LAPAROSCOPIC REVISION CONTINUOUS AMBULATORY PERITONEAL DIALYSIS  (CAPD) CATHETER;  Surgeon: Jordis Laneta FALCON, MD;  Location: [facility] ORS;  Service: General;  Laterality: N/A;   CARDIAC CATHETERIZATION  2013   S/p PCI    CARDIAC CATHETERIZATION  2018   S/p CABG   CATARACT EXTRACTION Bilateral    COLONOSCOPY     COLONOSCOPY  WITH PROPOFOL  N/A 10/17/2022   Procedure: COLONOSCOPY WITH PROPOFOL ;  Surgeon: Jinny Carmine, MD;  Location: [facility] ENDOSCOPY;  Service: Endoscopy;  Laterality: N/A;   CORONARY ARTERY BYPASS GRAFT  2018   (LIMA-LAD,VG-RCA,VG-OM1,VG-D1)   ESOPHAGOGASTRODUODENOSCOPY (EGD) WITH PROPOFOL  N/A 04/04/2020   Procedure: ESOPHAGOGASTRODUODENOSCOPY (EGD) WITH PROPOFOL ;  Surgeon: Jinny Carmine, MD;  Location: [facility] END[facility];  Service: Endoscopy;  Laterality: N/A;   ESOPHAGOGASTRODUODENOSCOPY (EGD) WITH PROPOFOL  N/A 06/06/2020   Procedure: ESOPHAGOGASTRODUODENOSCOPY (EGD) WITH PROPOFOL ;  Surgeon: Jinny Carmine, MD;  Location: [facility];  Service: Endoscopy;  Laterality: N/A;   ESOPHAGOGASTRODUODENOSCOPY (EGD) WITH PROPOFOL  N/A 09/24/2022   Procedure: ESOPHAGOGASTRODUODENOSCOPY (EGD) WITH PROPOFOL ;  Surgeon: Jinny Carmine, MD;  Location: [facility] END[facility];  Service: Endoscopy;  Laterality: N/A;   INCISION AND DRAINAGE OF WOUND Left 04/23/2024   Procedure: IRRIGATION AND DEBRIDEMENT WOUND;  Surgeon: Gust Molly, DO;  Location: [facility] ORS;  Service: Orthopedics;  Laterality: Left;  LEFT WRIST DEBRIDEMENT OF WOUND   PERIPHERAL VASCULAR CATHETERIZATION Right 10/28/2016   PTA/DEB Right SFA   WRIST SURGERY Left    cyst   Social History:  reports that he has been smoking cigarettes. He has a 9.8 pack-year smoking history. He has been exposed to tobacco smoke. He has never used smokeless tobacco. He reports that he does not currently use alcohol. He reports that he does not use drugs.  Allergies[1]  Family History  Problem Relation Age of Onset   Diabetes Mother    Heart disease Father     Prior to Admission medications  Medication Sig Start Date End Date Taking? Authorizing Provider  albuterol  (VENTOLIN  HFA) 108 (90 Base) MCG/ACT inhaler Inhale 1-2 puffs into the lungs every 6 (six) hours as needed for wheezing or shortness of breath. 05/28/24   Liana Fish, NP  ALPRAZolam  (XANAX ) 0.5 MG tablet Take 1 tablet  (0.5 mg total) by mouth 2 (two) times daily as needed for anxiety. 04/20/24   Abernathy, Alyssa, NP  amLODipine  (NORVASC ) 10 MG tablet Take 1 tablet (10 mg total) by mouth daily. 02/16/24   Gollan, Timothy J, MD  aspirin  EC 81 MG tablet Take 81 mg by mouth daily. Swallow whole.    [provider]  bisacodyl  (DULCOLAX) 5 MG EC tablet Take 10 mg by mouth 2 (two) times daily as needed for moderate constipation.    [provider]  carvedilol  (COREG ) 3.125 MG tablet Take 1 tablet (3.125 mg total) by mouth 2 (two) times daily with a meal. 02/16/24   Gollan, Evalene PARAS, MD  cholecalciferol  (VITAMIN D3) 25 MCG (1000 UNIT) tablet Take 2,000 Units by mouth 2 (two) times daily.    [provider]  cloNIDine  (CATAPRES ) 0.1 MG tablet Take 1 tablet (0.1 mg total) by mouth 2 (two) times daily as needed (pressure >175). 02/16/24   Gollan, Timothy J, MD  Evolocumab  (REPATHA  SURECLICK) 140 MG/ML SOAJ INJECT  1 ML UNDER THE SKIN EVERY 14 DAYS 02/16/24   Gollan, Timothy J, MD  ezetimibe  (ZETIA ) 10 MG tablet Take 1 tablet (10 mg total) by mouth daily. 02/16/24   Gollan, Timothy J, MD  fluconazole  (DIFLUCAN ) 200 MG tablet Take 1 tablet (200 mg total) by mouth daily. 04/27/24   Fayette Bodily, MD  gentamicin  cream (GARAMYCIN ) 0.1 % Apply 1 Application topically 3 (three) times daily. 03/24/24   [provider]  HUMALOG  MIX 75/25 KWIKPEN (75-25) 100 UNIT/ML KwikPen Inject 20 Units into the skin every morning. 75-25 pen injectior    [provider]  hydrALAZINE  (APRESOLINE ) 100 MG tablet Take 1 tablet (100 mg total) by mouth 3 (three) times daily. Patient taking differently: Take 100 mg by mouth 2 (two) times daily. 02/16/24 02/10/25  Gollan, Timothy J, MD  insulin  lispro (HUMALOG ) 100 UNIT/ML KwikPen Inject 0-15 Units into the skin 3 (three) times daily.    [provider]  Insulin  Pen Needle (B-D ULTRAFINE III SHORT PEN) 31G X 8 MM MISC To use with pen basal and mealtime  coverage insulin  pens 5 times daily. DX: E11.65 04/17/20   Hanford Powell BRAVO, NP  isosorbide  mononitrate (IMDUR ) 30 MG 24 hr tablet Take 1 tablet (30 mg total) by mouth 2 (two) times daily. 02/16/24   Gollan, Timothy J, MD  nicotine  (NICODERM CQ  - DOSED IN MG/24 HOURS) 21 mg/24hr patch Place 1 patch (21 mg total) onto the skin daily. Patient not taking: No sig reported 04/12/24   Laurita Pillion, MD  nitroGLYCERIN  (NITROSTAT ) 0.4 MG SL tablet Place 1 tablet (0.4 mg total) under the tongue every 5 (five) minutes as needed for chest pain. Patient not taking: Reported on 05/18/2024 02/16/24   Gollan, Timothy J, MD  nortriptyline  (PAMELOR ) 50 MG capsule Take 50 mg by mouth at bedtime.    [provider]  nystatin  ointment (MYCOSTATIN ) Apply 1 Application topically daily at 12 noon. 05/13/24 05/13/25  [provider]  oxyCODONE  (OXY IR/ROXICODONE ) 5 MG immediate release tablet Take 1 tablet (5 mg total) by mouth 2 (two) times daily as needed for severe pain (pain score 7-10). 04/13/24   Liana Fish, NP  pantoprazole  (PROTONIX ) 40 MG tablet Take 1 tablet (40 mg total) by mouth 2 (two) times daily before a meal. 11/19/23   Liana Fish, NP  torsemide  (DEMADEX ) 20 MG tablet Take 1 tablet (20 mg total) by mouth 2 (two) times daily. Patient taking differently: Take 40-60 mg by mouth 2 (two) times daily. 60 mg every morning and 40 mg 12 hours later 02/16/24   Gollan, Timothy J, MD  TOUJEO  MAX SOLOSTAR 300 UNIT/ML Solostar Pen Inject 60 Units into the skin daily.    [provider]    Physical Exam: Vitals:   05/31/24 1810 05/31/24 1812  BP: 139/86   Pulse: (!) 105   Resp: 13   Temp: 97.7 F (36.5 C)   SpO2: 93%   Weight:  68.9 kg  Height:  6' 1 (1.854 m)   Physical Exam Vitals and nursing note reviewed.  Constitutional:      General: He is not in acute distress.    Comments: Carollynn male, awake, alert, oriented to person and place  HENT:     Head:  Normocephalic and atraumatic.  Cardiovascular:     Rate and Rhythm: Normal rate and regular rhythm.     Heart sounds: Normal heart sounds.  Pulmonary:     Effort: Pulmonary effort is normal.  Breath sounds: Normal breath sounds.  Abdominal:     Palpations: Abdomen is soft.     Tenderness: There is no abdominal tenderness.  Musculoskeletal:     Comments: Bandaged right upper extremity and feet  Neurological:     General: No focal deficit present.     Mental Status: He is disoriented.     Labs on Admission: I have personally reviewed following labs and imaging studies  CBC: Recent Labs  Lab 05/31/24 1816  WBC 11.9*  HGB 13.1  HCT 37.8*  MCV 92.0  PLT 246   Basic Metabolic Panel: Recent Labs  Lab 05/31/24 1900  NA 129*  K 3.7  CL 87*  CO2 23  GLUCOSE 178*  BUN 53*  CREATININE 6.60*  CALCIUM  9.0  MG 2.1   GFR: Estimated Creatinine Clearance: 10.9 mL/min (A) (by C-G formula based on SCr of 6.6 mg/dL (H)). Liver Function Tests: No results for input(s): AST, ALT, ALKPHOS, BILITOT, PROT, ALBUMIN  in the last 168 hours. No results for input(s): LIPASE, AMYLASE in the last 168 hours. No results for input(s): AMMONIA in the last 168 hours. Coagulation Profile: No results for input(s): INR, PROTIME in the last 168 hours. Cardiac Enzymes: No results for input(s): CKTOTAL, CKMB, CKMBINDEX, TROPONINI in the last 168 hours. BNP (last 3 results) Recent Labs    05/31/24 1900  PROBNP >35,000.0*   HbA1C: No results for input(s): HGBA1C in the last 72 hours. CBG: No results for input(s): GLUCAP in the last 168 hours. Lipid Profile: No results for input(s): CHOL, HDL, LDLCALC, TRIG, CHOLHDL, LDLDIRECT in the last 72 hours. Thyroid  Function Tests: No results for input(s): TSH, T4TOTAL, FREET4, T3FREE, THYROIDAB in the last 72 hours. Anemia Panel: No results for input(s): VITAMINB12, FOLATE, FERRITIN, TIBC,  IRON, RETICCTPCT in the last 72 hours. Urine analysis:    Component Value Date/Time   COLORURINE YELLOW (A) 08/31/2020 1942   APPEARANCEUR CLEAR (A) 08/31/2020 1942   LABSPEC 1.012 08/31/2020 1942   PHURINE 7.0 08/31/2020 1942   GLUCOSEU >=500 (A) 08/31/2020 1942   HGBUR MODERATE (A) 08/31/2020 1942   BILIRUBINUR NEGATIVE 08/31/2020 1942   KETONESUR 5 (A) 08/31/2020 1942   PROTEINUR >=300 (A) 08/31/2020 1942   NITRITE NEGATIVE 08/31/2020 1942   LEUKOCYTESUR NEGATIVE 08/31/2020 1942    Radiological Exams on Admission: DG Chest 2 View Result Date: 05/31/2024 EXAM: 2 VIEW(S) XRAY OF THE CHEST 05/31/2024 06:28:00 PM COMPARISON: 04/08/2024 CLINICAL HISTORY: sob FINDINGS: LINES, TUBES AND DEVICES: Left PICC line in place with the tip in the SVC. LUNGS AND PLEURA: No focal pulmonary opacity. No pleural effusion. No pneumothorax. HEART AND MEDIASTINUM: Prior CABG. Aortic atherosclerosis. No acute abnormality of the cardiac and mediastinal silhouettes. BONES AND SOFT TISSUES: No acute osseous abnormality. IMPRESSION: 1. No active cardiopulmonary disease. Electronically signed by: Franky Crease MD 05/31/2024 07:15 PM EST RP Workstation: [id number]   Data Reviewed for HPI: Relevant notes from primary care and specialist visits, past discharge summaries as available in EHR, including Care Everywhere. Prior diagnostic testing as pertinent to current admission diagnoses Updated medications and problem lists for reconciliation ED course, including vitals, labs, imaging, treatment and response to treatment Triage notes, nursing and pharmacy notes and ED provider's notes Notable results as noted above in HPI  Assessment and Plan: * NSTEMI (non-ST elevated myocardial infarction) (HCC) CAD with history of four-vessel CABG Patient with episode of shortness of breath probable chest discomfort, nonspecific EKG, elevated troponin in the 600s Received aspirin  in the ED Continue heparin   infusion Continue  aspirin , carvedilol , nitroglycerin  sublingual as needed chest pain, Zetia  and Repatha  Cardiology consult- [facility] Continue to trend troponins Echo ordered to evaluate for wall motion abnormality Will keep n.p.o. after midnight in case of procedure  Possible New onset atrial fibrillation (HCC) EKG reading A-fib with controlled rate there appears to have P waves Continuous cardiac monitoring overnight Will defer to cardiology consult for interpretation  Calciphylaxis with multiple nonhealing ulcer, with fat layer exposed (HCC) Patient with mild multiple wounds, followed at wound care center Continue pain control Continue wound care  Discitis of cervical region On current long-term antibiotics for discitis treatment  Continue ceftriaxone , daptomycin  and fluconazole  via PICC line Antibiotic course was recently extended per review of Care Everywhere  ESRD on peritoneal dialysis [facility]) Nephrology consulted for continuation of dialysis--spoke with Dr. Marcelino, will see in the a.m. to arrange for PD  Chronic systolic heart failure (HCC) Clinically euvolemic Continue GDMT  Essential hypertension Continue home meds  COPD (chronic obstructive pulmonary disease) (HCC) Not acutely exacerbated Continue home inhalers with DuoNebs as needed  Depression with anxiety Continue home alprazolam  and nortriptyline   Type 2 diabetes mellitus with diabetic polyneuropathy, with long-term current use of insulin  (HCC) Continue basal insulin  with sliding scale coverage  Chronic pain syndrome Continue as needed oxycodone     DVT prophylaxis: heparin  infusion  Consults: [facility] cardiology and renal  Advance Care Planning:   Code Status: Prior   Family Communication: none  Disposition Plan: Back to previous home environment  Severity of Illness: The appropriate patient status for this patient is OBSERVATION. Observation status is judged to be reasonable and necessary in order to provide the required  intensity of service to ensure the patient's safety. The patient's presenting symptoms, physical exam findings, and initial radiographic and laboratory data in the context of their medical condition is felt to place them at decreased risk for further clinical deterioration. Furthermore, it is anticipated that the patient will be medically stable for discharge from the hospital within 2 midnights of admission.   Author: Delayne LULLA Solian, MD 05/31/2024 9:18 PM  For on call review [email or web address].       [1]  Allergies Allergen Reactions   Iodinated Contrast Media Other (See Comments)    Kidney disease  Kidney disease  Kidney disease  Kidney disease   Other Other (See Comments)    Pain  With joint stiffness     Statins Other (See Comments)    Pain  With joint stiffness   Pain  With joint stiffness  Other Reaction(s): Joint Pain   Pregabalin Other (See Comments)    Generalized aches and pains Generalized aches and pains Generalized aches and pains Generalized aches and pains Generalized aches and pains   Sacubitril-Valsartan Other (See Comments)    Hyperkalemia  Hyperkalemia Hyperkalemia  Hyperkalemia   Atorvastatin Other (See Comments)    Muscle aches Muscle aches   Pravastatin Other (See Comments)    Muscle aches Muscle aches   Rosuvastatin Other (See Comments)    Muscle Aches Other reaction(s): JOINT PAIN Other reaction(s): JOINT PAIN Muscle Aches    "

## 2024-05-31 NOTE — Assessment & Plan Note (Signed)
Not acutely exacerbated ?Continue home inhalers with DuoNebs as needed ?

## 2024-05-31 NOTE — ED Triage Notes (Signed)
 Pt comes in from home via [facility] with complaints of SOB. Pt reports having a cough for about a week. According to EMS, pt is on antibiotics for the cough. Pt noted to be in uncontrolled AFIB with EMS. Pt places on 3L nasal cannula with ems for comfort. Pt is alert and oriented x4.

## 2024-05-31 NOTE — Assessment & Plan Note (Addendum)
 Nephrology consulted for continuation of dialysis- -PD dialysis with the help of nephrology

## 2024-05-31 NOTE — ED Notes (Signed)
Repeat green top sent.

## 2024-05-31 NOTE — Assessment & Plan Note (Signed)
 Continue home alprazolam  and nortriptyline 

## 2024-05-31 NOTE — Assessment & Plan Note (Addendum)
 CAD with history of four-vessel CABG Patient with episode of shortness of breath probable chest discomfort, nonspecific EKG, elevated troponin in the 600s Received aspirin  in the ED Continue heparin  infusion Continue aspirin , carvedilol , nitroglycerin  sublingual as needed chest pain, Zetia  and Repatha  Cardiology is on board, recommending continue heparin  infusion. Echocardiogram with for the worsening of EF not to 20 to 25%, global hypokinesis.

## 2024-05-31 NOTE — ED Provider Notes (Signed)
 "  [facility] Provider Note    Event Date/Time   First MD Initiated Contact with Patient 05/31/24 1818     (approximate)   History   Shortness of Breath   HPI  [name] [name] is a 64 y.o. 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  Recent C-spine discitis, PICC line in place receiving IV ceftriaxone , daptomycin  and fluconazole .  ESRD on PD.  Type 1 diabetes, HTN, CAD s/p CABG, COPD, CHF.   Patient presents to the ED for evaluation of an episode of shortness of breath and pain.  His wife shows me a 5-minute video on her phone that she recorded of him during this episode.  Patient was hunched forward while seated on the couch, moaning.  Reportedly with headache, abdominal pain, chest pain and back pain.  No syncope, seizure activity or loss of consciousness  They report this has been happening about once per week for the past few months and each episode is fairly similar but today as well as of a more severe intensity and lasted for a longer duration (about 30 minutes).  By the time I see him here in the ED he reports feeling fine and back to normal   Physical Exam   Triage Vital Signs: ED Triage Vitals  Encounter Vitals Group     BP 05/31/24 1810 139/86     Girls Systolic BP Percentile --      Girls Diastolic BP Percentile --      Boys Systolic BP Percentile --      Boys Diastolic BP Percentile --      Pulse Rate 05/31/24 1810 (!) 105     Resp 05/31/24 1810 13     Temp 05/31/24 1810 97.7 F (36.5 C)     Temp src --      SpO2 05/31/24 1810 93 %     Weight 05/31/24 1812 152 lb (68.9 kg)     Height 05/31/24 1812 6' 1 (1.854 m)     Head Circumference --      Peak Flow --      Pain Score 05/31/24 1806 8     Pain Loc --      Pain Education --      Exclude from Growth Chart --     Most recent vital  signs: Vitals:   05/31/24 1810  BP: 139/86  Pulse: (!) 105  Resp: 13  Temp: 97.7 F (36.5 C)  SpO2: 93%    General: Awake, no distress.  CV:  Good peripheral perfusion.  Resp:  Normal effort.  Abd:  No distention.  MSK:  No deformity noted.  Neuro:  No focal deficits appreciated. Other:     ED Results / Procedures / Treatments   Labs (all labs ordered are listed, but only abnormal results are displayed) Labs Reviewed  CBC - Abnormal; Notable for the following components:      Result Value   WBC 11.9 (*)    RBC 4.11 (*)    HCT 37.8 (*)    All other components within normal limits  BASIC METABOLIC PANEL WITH GFR - Abnormal; Notable for the following components:   Sodium 129 (*)    Chloride 87 (*)    Glucose, Bld 178 (*)    BUN 53 (*)    Creatinine, Ser 6.60 (*)  GFR, Estimated 9 (*)    Anion gap 20 (*)    All other components within normal limits  PRO BRAIN NATRIURETIC PEPTIDE - Abnormal; Notable for the following components:   Pro Brain Natriuretic Peptide >35,000.0 (*)    All other components within normal limits  TROPONIN T, HIGH SENSITIVITY - Abnormal; Notable for the following components:   Troponin T High Sensitivity 639 (*)    All other components within normal limits  MAGNESIUM  APTT  PROTIME-INR  CBC  HEPARIN  LEVEL (UNFRACTIONATED)  TROPONIN T, HIGH SENSITIVITY    EKG Sinus rhythm with rate of 88 bpm, biphasic T waves laterally V5/V6 with ST depression, left anterior fascicular block, No STEMI.  RADIOLOGY CXR interpreted by me without evidence of acute cardiopulmonary pathology.  Official radiology report(s): DG Chest 2 View Result Date: 05/31/2024 EXAM: 2 VIEW(S) XRAY OF THE CHEST 05/31/2024 06:28:00 PM COMPARISON: 04/08/2024 CLINICAL HISTORY: sob FINDINGS: LINES, TUBES AND DEVICES: Left PICC line in place with the tip in the SVC. LUNGS AND PLEURA: No focal pulmonary opacity. No pleural effusion. No pneumothorax. HEART AND MEDIASTINUM: Prior  CABG. Aortic atherosclerosis. No acute abnormality of the cardiac and mediastinal silhouettes. BONES AND SOFT TISSUES: No acute osseous abnormality. IMPRESSION: 1. No active cardiopulmonary disease. Electronically signed by: Franky Crease MD 05/31/2024 07:15 PM EST RP Workstation: [id number]    PROCEDURES and INTERVENTIONS:  .1-3 Lead EKG Interpretation  Performed by: Claudene Rover, MD Authorized by: Claudene Rover, MD     Interpretation: normal     ECG rate:  92   ECG rate assessment: normal     Rhythm: sinus rhythm     Ectopy: none     Conduction: normal   .Critical Care  Performed by: Claudene Rover, MD Authorized by: Claudene Rover, MD   Critical care provider statement:    Critical care time (minutes):  30   Critical care time was exclusive of:  Separately billable procedures and treating other patients   Critical care was necessary to treat or prevent imminent or life-threatening deterioration of the following conditions:  Circulatory failure and cardiac failure   Critical care was time spent personally by me on the following activities:  Development of treatment plan with patient or surrogate, discussions with consultants, evaluation of patient's response to treatment, examination of patient, ordering and review of laboratory studies, ordering and review of radiographic studies, ordering and performing treatments and interventions, pulse oximetry, re-evaluation of patient's condition and review of old charts   Medications  heparin  bolus via infusion 4,000 Units (has no administration in time range)  heparin  ADULT infusion 100 units/mL (25000 units/250mL) (has no administration in time range)  aspirin  chewable tablet 324 mg (has no administration in time range)     IMPRESSION / MDM / ASSESSMENT AND PLAN / ED COURSE  I reviewed the triage vital signs and the nursing notes.  Differential diagnosis includes, but is not limited to, ACS, PTX, PNA, muscle strain/spasm, PE, dissection,  anxiety, pleural effusion, seizure, acute stress reaction  {Patient presents with symptoms of an acute illness or injury that is potentially life-threatening.  Patient presents after a resolved episode of discomfort and dyspnea with concerning signs for an NSTEMI requiring heparinization and medical admission.  EKG with new biphasic T waves laterally but no STEMI, first troponin is elevated to the 600s.  He is now asymptomatic.  No indications for emergent dialysis.  Essentially normal CBC.  Clear CXR.  Will provide aspirin , started on heparin  and consult medicine for  admission      FINAL CLINICAL IMPRESSION(S) / ED DIAGNOSES   Final diagnoses:  NSTEMI (non-ST elevated myocardial infarction) (HCC)     Rx / DC Orders   ED Discharge Orders     None        Note:  This document was prepared using Dragon voice recognition software and may include unintentional dictation errors.   Claudene Rover, MD 05/31/24 2033  "

## 2024-06-01 ENCOUNTER — Ambulatory Visit: Admitting: Infectious Diseases

## 2024-06-01 ENCOUNTER — Observation Stay (HOSPITAL_COMMUNITY)
Admit: 2024-06-01 | Discharge: 2024-06-01 | Disposition: A | Discharge status: Home / Self Care | Attending: Internal Medicine | Admitting: Internal Medicine

## 2024-06-01 ENCOUNTER — Telehealth: Payer: Self-pay

## 2024-06-01 DIAGNOSIS — Z8616 Personal history of COVID-19: Secondary | ICD-10-CM | POA: Diagnosis not present

## 2024-06-01 DIAGNOSIS — E1142 Type 2 diabetes mellitus with diabetic polyneuropathy: Secondary | ICD-10-CM

## 2024-06-01 DIAGNOSIS — E785 Hyperlipidemia, unspecified: Secondary | ICD-10-CM

## 2024-06-01 DIAGNOSIS — J439 Emphysema, unspecified: Secondary | ICD-10-CM

## 2024-06-01 DIAGNOSIS — I25709 Atherosclerosis of coronary artery bypass graft(s), unspecified, with unspecified angina pectoris: Secondary | ICD-10-CM

## 2024-06-01 DIAGNOSIS — F32A Depression, unspecified: Secondary | ICD-10-CM | POA: Diagnosis present

## 2024-06-01 DIAGNOSIS — I4891 Unspecified atrial fibrillation: Secondary | ICD-10-CM | POA: Diagnosis present

## 2024-06-01 DIAGNOSIS — I2510 Atherosclerotic heart disease of native coronary artery without angina pectoris: Secondary | ICD-10-CM

## 2024-06-01 DIAGNOSIS — R0609 Other forms of dyspnea: Secondary | ICD-10-CM | POA: Diagnosis not present

## 2024-06-01 DIAGNOSIS — Z66 Do not resuscitate: Secondary | ICD-10-CM | POA: Diagnosis present

## 2024-06-01 DIAGNOSIS — I214 Non-ST elevation (NSTEMI) myocardial infarction: Secondary | ICD-10-CM

## 2024-06-01 DIAGNOSIS — Z515 Encounter for palliative care: Secondary | ICD-10-CM | POA: Diagnosis not present

## 2024-06-01 DIAGNOSIS — Z792 Long term (current) use of antibiotics: Secondary | ICD-10-CM | POA: Diagnosis not present

## 2024-06-01 DIAGNOSIS — Z9861 Coronary angioplasty status: Secondary | ICD-10-CM | POA: Diagnosis not present

## 2024-06-01 DIAGNOSIS — Z992 Dependence on renal dialysis: Secondary | ICD-10-CM

## 2024-06-01 DIAGNOSIS — G928 Other toxic encephalopathy: Secondary | ICD-10-CM | POA: Diagnosis present

## 2024-06-01 DIAGNOSIS — G894 Chronic pain syndrome: Secondary | ICD-10-CM | POA: Diagnosis not present

## 2024-06-01 DIAGNOSIS — E43 Unspecified severe protein-calorie malnutrition: Secondary | ICD-10-CM | POA: Diagnosis present

## 2024-06-01 DIAGNOSIS — J449 Chronic obstructive pulmonary disease, unspecified: Secondary | ICD-10-CM | POA: Diagnosis present

## 2024-06-01 DIAGNOSIS — R64 Cachexia: Secondary | ICD-10-CM | POA: Diagnosis present

## 2024-06-01 DIAGNOSIS — I700 Atherosclerosis of aorta: Secondary | ICD-10-CM | POA: Diagnosis present

## 2024-06-01 DIAGNOSIS — R291 Meningismus: Secondary | ICD-10-CM | POA: Diagnosis present

## 2024-06-01 DIAGNOSIS — F418 Other specified anxiety disorders: Secondary | ICD-10-CM

## 2024-06-01 DIAGNOSIS — I5022 Chronic systolic (congestive) heart failure: Secondary | ICD-10-CM

## 2024-06-01 DIAGNOSIS — I21A1 Myocardial infarction type 2: Secondary | ICD-10-CM | POA: Diagnosis present

## 2024-06-01 DIAGNOSIS — I4892 Unspecified atrial flutter: Secondary | ICD-10-CM | POA: Diagnosis not present

## 2024-06-01 DIAGNOSIS — I5020 Unspecified systolic (congestive) heart failure: Secondary | ICD-10-CM | POA: Diagnosis not present

## 2024-06-01 DIAGNOSIS — M4642 Discitis, unspecified, cervical region: Secondary | ICD-10-CM

## 2024-06-01 DIAGNOSIS — E1122 Type 2 diabetes mellitus with diabetic chronic kidney disease: Secondary | ICD-10-CM | POA: Diagnosis present

## 2024-06-01 DIAGNOSIS — D631 Anemia in chronic kidney disease: Secondary | ICD-10-CM | POA: Diagnosis present

## 2024-06-01 DIAGNOSIS — I10 Essential (primary) hypertension: Secondary | ICD-10-CM

## 2024-06-01 DIAGNOSIS — I484 Atypical atrial flutter: Secondary | ICD-10-CM

## 2024-06-01 DIAGNOSIS — N186 End stage renal disease: Secondary | ICD-10-CM | POA: Diagnosis present

## 2024-06-01 DIAGNOSIS — Z7901 Long term (current) use of anticoagulants: Secondary | ICD-10-CM | POA: Diagnosis not present

## 2024-06-01 DIAGNOSIS — L97912 Non-pressure chronic ulcer of unspecified part of right lower leg with fat layer exposed: Secondary | ICD-10-CM

## 2024-06-01 DIAGNOSIS — L97812 Non-pressure chronic ulcer of other part of right lower leg with fat layer exposed: Secondary | ICD-10-CM | POA: Diagnosis present

## 2024-06-01 DIAGNOSIS — I350 Nonrheumatic aortic (valve) stenosis: Secondary | ICD-10-CM | POA: Diagnosis present

## 2024-06-01 DIAGNOSIS — Z7189 Other specified counseling: Secondary | ICD-10-CM | POA: Diagnosis not present

## 2024-06-01 DIAGNOSIS — Z794 Long term (current) use of insulin: Secondary | ICD-10-CM

## 2024-06-01 DIAGNOSIS — E871 Hypo-osmolality and hyponatremia: Secondary | ICD-10-CM | POA: Diagnosis present

## 2024-06-01 DIAGNOSIS — I132 Hypertensive heart and chronic kidney disease with heart failure and with stage 5 chronic kidney disease, or end stage renal disease: Secondary | ICD-10-CM | POA: Diagnosis present

## 2024-06-01 DIAGNOSIS — K6289 Other specified diseases of anus and rectum: Secondary | ICD-10-CM | POA: Diagnosis not present

## 2024-06-01 DIAGNOSIS — R0602 Shortness of breath: Secondary | ICD-10-CM | POA: Diagnosis not present

## 2024-06-01 LAB — CBC
HCT: 33.7 % — ABNORMAL LOW (ref 39.0–52.0)
Hemoglobin: 11.6 g/dL — ABNORMAL LOW (ref 13.0–17.0)
MCH: 32 pg (ref 26.0–34.0)
MCHC: 34.4 g/dL (ref 30.0–36.0)
MCV: 92.8 fL (ref 80.0–100.0)
Platelets: 225 K/uL (ref 150–400)
RBC: 3.63 MIL/uL — ABNORMAL LOW (ref 4.22–5.81)
RDW: 14.9 % (ref 11.5–15.5)
WBC: 11.2 K/uL — ABNORMAL HIGH (ref 4.0–10.5)
nRBC: 0 % (ref 0.0–0.2)

## 2024-06-01 LAB — ECHOCARDIOGRAM COMPLETE
AR max vel: 0.9 cm2
AV Area VTI: 0.91 cm2
AV Area mean vel: 0.86 cm2
AV Mean grad: 14 mmHg
AV Peak grad: 27.5 mmHg
Ao pk vel: 2.62 m/s
Area-P 1/2: 4.6 cm2
Calc EF: 12.1 %
Height: 73 in
MV VTI: 1.39 cm2
S' Lateral: 4.8 cm
Single Plane A2C EF: 8.9 %
Single Plane A4C EF: 16.1 %
Weight: 2432.01 [oz_av]

## 2024-06-01 LAB — TSH: TSH: 1.13 u[IU]/mL (ref 0.350–4.500)

## 2024-06-01 LAB — TROPONIN T, HIGH SENSITIVITY
Troponin T High Sensitivity: 674 ng/L (ref 0–19)
Troponin T High Sensitivity: 643 ng/L (ref 0–19)

## 2024-06-01 LAB — GLUCOSE, CAPILLARY
Glucose-Capillary: 145 mg/dL — ABNORMAL HIGH (ref 70–99)
Glucose-Capillary: 31 mg/dL — CL (ref 70–99)

## 2024-06-01 LAB — HEMOGLOBIN A1C
Hgb A1c MFr Bld: 7.5 % — ABNORMAL HIGH (ref 4.8–5.6)
Mean Plasma Glucose: 168.55 mg/dL

## 2024-06-01 LAB — CBG MONITORING, ED
Glucose-Capillary: 146 mg/dL — ABNORMAL HIGH (ref 70–99)
Glucose-Capillary: 111 mg/dL — ABNORMAL HIGH (ref 70–99)
Glucose-Capillary: 142 mg/dL — ABNORMAL HIGH (ref 70–99)

## 2024-06-01 LAB — HEPARIN LEVEL (UNFRACTIONATED)
Heparin Unfractionated: <0.1 [IU]/mL — ABNORMAL LOW (ref 0.30–0.70)
Heparin Unfractionated: 0.3 [IU]/mL (ref 0.30–0.70)

## 2024-06-01 MED ORDER — DELFLEX-LC/2.5% DEXTROSE 394 MOSM/L IP SOLN
INTRAPERITONEAL | Status: DC
  Filled 2024-06-01 (×4): qty 3000

## 2024-06-01 MED ORDER — DEXTROSE 50 % IV SOLN
25.0000 g | INTRAVENOUS | Status: AC
  Administered 2024-06-01: 25 g via INTRAVENOUS
  Filled 2024-06-01: qty 50

## 2024-06-01 MED ORDER — SODIUM CHLORIDE 0.9 % IV SOLN
2.0000 g | INTRAVENOUS | Status: AC
  Administered 2024-06-01 – 2024-06-02 (×2): 2 g via INTRAVENOUS
  Filled 2024-06-01 (×2): qty 20

## 2024-06-01 MED ORDER — DELFLEX-LC/1.5% DEXTROSE 344 MOSM/L IP SOLN
INTRAPERITONEAL | Status: DC
  Administered 2024-06-02: 3000 mL via INTRAPERITONEAL
  Filled 2024-06-01 (×3): qty 3000

## 2024-06-01 MED ORDER — DAPTOMYCIN 500 MG IV SOLR: 500.0000 mg | INTRAVENOUS | Status: DC

## 2024-06-01 MED ORDER — GENTAMICIN SULFATE 0.1 % EX CREA
1.0000 | TOPICAL_CREAM | Freq: Every day | CUTANEOUS | Status: DC
  Administered 2024-06-02 – 2024-06-07 (×6): 1 via TOPICAL
  Filled 2024-06-01 (×2): qty 15

## 2024-06-01 MED ORDER — HEPARIN BOLUS VIA INFUSION
2100.0000 [IU] | Freq: Once | INTRAVENOUS | Status: AC
  Administered 2024-06-01: 2100 [IU] via INTRAVENOUS
  Filled 2024-06-01: qty 2100

## 2024-06-01 MED ORDER — DELFLEX-LC/2.5% DEXTROSE 394 MOSM/L IP SOLN
INTRAPERITONEAL | Status: DC
  Administered 2024-06-02: 6000 mL via INTRAPERITONEAL
  Filled 2024-06-01 (×3): qty 3000

## 2024-06-01 NOTE — Consult Note (Signed)
 "  Cardiology Consultation:   Patient ID: [name] [name]; [id number]; 1961-03-26   Admit date: 05/31/2024 Date of Consult: 06/01/2024  Primary Care Provider: Liana Fish, NP Primary Cardiologist: Perla Primary Electrophysiologist:  None   Patient Profile:   [name] [name] is a 64 y.o. male with a hx of CAD status post four-vessel CABG in 2018 in [place]  with LIMA to LAD, SVG to D1, SVG to OM1, and SVG to RCA, HFrEF, PAD status post prior intervention complicated by nonhealing ulcer with calciphylaxis, cervical spine discitis, ESRD on PD, DM2 with diabetic neuropathy, HTN, HLD with statin intolerance, COPD with prior tobacco use quitting in 2013, and OSA intolerant to CPAP who is being seen today for the evaluation of new onset atrial flutter and elevated high-sensitivity troponin at the request of Dr. Cleatus.  History of Present Illness:   Mr. Mcevers establish care with Dr. Gollan in 2021.  Prior records demonstrated an EF of 35%, presumably in 2018.  Echo in 02/2018 with an EF of 50%.  Lexiscan  MPI at that time showed a small to moderate inferolateral infarct without ischemia.  Echo in 02/2019 with an EF of 40 to 45%.  Over the years, he has had fluctuating LV systolic function with EF ranging from 30 to 55%.  His recent echo from 02/2023 showed an EF of 35 to 40% mild LVH, grade 3 diastolic dysfunction, mild global hypokinesis with severe hypokinesis of the mid to apical inferior wall and inferolateral wall, mildly reduced RV systolic function normal ventricular cavity size and RVSP, mildly dilated left atrium, moderate to severe mitral regurgitation, and trivial aortic insufficiency.  Lexiscan  MPI in 03/2023 showed fixed inferolateral defect consistent with prior MI without evidence of ischemia with an EF of 46% and was overall low risk.  More recently, he was admitted to the hospital in 03/2024 with osteomyelitis of the cervical spine/discitis treated with IV antibiotics.  He was also  found to have a fungal wound along the left wrist requiring I&D in 03/2024.  He was admitted to the hospital in late 04/2024 with worsening wounds along the right lower extremity ultimately attributed to calciphylaxis.  He was admitted to [facility] on 06/01/2023 with worsening shortness of breath per prior documentation.  No family at bedside.  Patient unable to provide history.  Upon arrival to the ED he was afebrile and hemodynamically stable with oxygen saturation 93% on room air requiring supplemental oxygen at 2 L via nasal cannula.  Initial high-sensitivity troponin 639 trending 2 AM peaking at 689, currently downtrending.  proBNP greater than 35,000 in the setting of end-stage renal disease with a serum creatinine of 6.6.  WBC 11.9, Hgb 13.1 trending to 11.6.  Magnesium 2.1.  Chest x-ray with no acute cardiopulmonary disease.  He was noted to be in new onset atrial flutter with variable AV block, 88 bpm, LVH, and nonspecific ST-T changes.  He received aspirin  324 mg x 1 and was placed on a heparin  drip.  Upon cardiology consultation, he remains in rate controlled atrial flutter and is unable to provide further history.  He is not currently oriented.     Past Medical History:  Diagnosis Date   Anemia    Arthritis    CAD (coronary artery disease)    a. 12/2016 s/p CABG x 4 (LIMA->LAD, VG->RCA, VG->OM1, VG->D1); b. 02/2018 MV: EF 35%, small-med inferolaterlal infarct. No ischemia.   CKD (chronic kidney disease), stage IV [facility])    Peritoneal Dialysis   Colon polyps  COPD (chronic obstructive pulmonary disease) (HCC)    Dupre's syndrome    GERD (gastroesophageal reflux disease)    HFrEF (heart failure reduced ejection fraction) (HCC)    a.) 2018 EF 35%; b.) 02/2018 EF 50%; c). 02/2019 EF 40-45%; d.) 08/2019 Echo: EF 50-55%, no rwma, GrII DD; e.) 04/2020 Echo: EF 30-35%, mod-sev glob HK. Mild LVH. G2DD. Low-nl RV fxn. Mildly dil LA. Mild MR. Mild-mod Ao sclerosis w/o stenosis; f.) TTE 02/09/2021: EF  55%; septal HK, LAE; G2DD.   History of 2019 novel coronavirus disease (COVID-19) 02/08/2021   Hyperlipidemia    Hypertension    Ischemic cardiomyopathy    a.) 02/2018 MV: EF 35%; b.) 08/2019 Echo: EF 50-55%; c.) 04/2020 Echo: EF 30-35%; d.) TTE 02/09/2021: EF 50-55%.   Lymphedema    Migraine    Myocardial infarction (HCC)    Neuropathy    PAD (peripheral artery disease)    a. 04/2017 Aortoiliac duplex: R iliac dzs; b. 03/2020 LE Duplex: mild bilat atherosclerosis throughout. Patent vessels.   Pneumonia    Retinopathy    S/P CABG x 4    a.)  4v CABG: LIMA->LAD, SVG->RCA, SVG->OM1, SVG->D1   Sleep apnea    a.) does not require nocturnal PAP therapy   Statin intolerance    Stomach ulcer    T2DM (type 2 diabetes mellitus) (HCC)     Past Surgical History:  Procedure Laterality Date   bone graft surgery Bilateral    on both feet x 2   CAPD INSERTION N/A 11/13/2021   Procedure: LAPAROSCOPIC INSERTION CONTINUOUS AMBULATORY PERITONEAL DIALYSIS  (CAPD) CATHETER;  Surgeon: Jordis Laneta FALCON, MD;  Location: [facility] OR[facility];  Service: General;  Laterality: N/A;   CAPD REVISION N/A 01/10/2022   Procedure: LAPAROSCOPIC REVISION CONTINUOUS AMBULATORY PERITONEAL DIALYSIS  (CAPD) CATHETER;  Surgeon: Jordis Laneta FALCON, MD;  Location: [facility];  Service: General;  Laterality: N/A;   CARDIAC CATHETERIZATION  2013   S/p PCI    CARDIAC CATHETERIZATION  2018   S/p CABG   CATARACT EXTRACTION Bilateral    COLONOSCOPY     COLONOSCOPY WITH PROPOFOL  N/A 10/17/2022   Procedure: COLONOSCOPY WITH PROPOFOL ;  Surgeon: Jinny Carmine, MD;  Location: [facility];  Service: Endoscopy;  Laterality: N/A;   CORONARY ARTERY BYPASS GRAFT  2018   (LIMA-LAD,VG-RCA,VG-OM1,VG-D1)   ESOPHAGOGASTRODUODENOSCOPY (EGD) WITH PROPOFOL  N/A 04/04/2020   Procedure: ESOPHAGOGASTRODUODENOSCOPY (EGD) WITH PROPOFOL ;  Surgeon: Jinny Carmine, MD;  Location: [facility];  Service: Endoscopy;  Laterality: N/A;   ESOPHAGOGASTRODUODENOSCOPY (EGD)  WITH PROPOFOL  N/A 06/06/2020   Procedure: ESOPHAGOGASTRODUODENOSCOPY (EGD) WITH PROPOFOL ;  Surgeon: Jinny Carmine, MD;  Location: [facility];  Service: Endoscopy;  Laterality: N/A;   ESOPHAGOGASTRODUODENOSCOPY (EGD) WITH PROPOFOL  N/A 09/24/2022   Procedure: ESOPHAGOGASTRODUODENOSCOPY (EGD) WITH PROPOFOL ;  Surgeon: Jinny Carmine, MD;  Location: [facility];  Service: Endoscopy;  Laterality: N/A;   INCISION AND DRAINAGE OF WOUND Left 04/23/2024   Procedure: IRRIGATION AND DEBRIDEMENT WOUND;  Surgeon: Gust Molly, DO;  Location: [facility];  Service: Orthopedics;  Laterality: Left;  LEFT WRIST DEBRIDEMENT OF WOUND   PERIPHERAL VASCULAR CATHETERIZATION Right 10/28/2016   PTA/DEB Right SFA   WRIST SURGERY Left    cyst     Home Meds: Prior to Admission medications  Medication Sig Start Date End Date Taking? Authorizing Provider  albuterol  (VENTOLIN  HFA) 108 (90 Base) MCG/ACT inhaler Inhale 1-2 puffs into the lungs every 6 (six) hours as needed for wheezing or shortness of breath. 05/28/24  Yes Liana Fish, NP  ALPRAZolam  (  XANAX ) 0.5 MG tablet Take 1 tablet (0.5 mg total) by mouth 2 (two) times daily as needed for anxiety. 04/20/24  Yes Abernathy, Mardy, NP  amLODipine  (NORVASC ) 10 MG tablet Take 1 tablet (10 mg total) by mouth daily. 02/16/24  Yes Gollan, Timothy J, MD  aspirin  EC 81 MG tablet Take 81 mg by mouth daily. Swallow whole.   Yes [provider]  bisacodyl  (DULCOLAX) 5 MG EC tablet Take 10 mg by mouth 2 (two) times daily as needed for moderate constipation.   Yes [provider]  carvedilol  (COREG ) 3.125 MG tablet Take 1 tablet (3.125 mg total) by mouth 2 (two) times daily with a meal. 02/16/24  Yes Gollan, Timothy J, MD  cefTRIAXone  (ROCEPHIN ) 2 g injection 2 g daily. HOME INFUSION 2 GM IVP Q24H 04/12/24  Yes [provider]  cholecalciferol  (VITAMIN D3) 25 MCG (1000 UNIT) tablet Take 2,000 Units by mouth 2 (two) times daily.   Yes [provider]  DAPTOmycin  (CUBICIN ) 500 MG injection Inject 500 mg into the vein every other day. HOME INFUSION 500 MG IVP EVERY OTHER DAY 05/26/24  Yes [provider]  Evolocumab  (REPATHA  SURECLICK) 140 MG/ML SOAJ INJECT 1 ML UNDER THE SKIN EVERY 14 DAYS 02/16/24  Yes Gollan, Timothy J, MD  ezetimibe  (ZETIA ) 10 MG tablet Take 1 tablet (10 mg total) by mouth daily. 02/16/24  Yes Gollan, Timothy J, MD  fluconazole  (DIFLUCAN ) 200 MG tablet Take 1 tablet (200 mg total) by mouth daily. 04/27/24  Yes Fayette Bodily, MD  gentamicin  cream (GARAMYCIN ) 0.1 % Apply 1 Application topically 3 (three) times daily. 03/24/24  Yes [provider]  HUMALOG  MIX 75/25 KWIKPEN (75-25) 100 UNIT/ML KwikPen Inject 30 Units into the skin every morning. 75-25 pen injectior preceding dialysis treatment.   Yes [provider]  hydrALAZINE  (APRESOLINE ) 100 MG tablet Take 1 tablet (100 mg total) by mouth 3 (three) times daily. Patient taking differently: Take 100 mg by mouth 2 (two) times daily. 02/16/24 02/10/25 Yes Gollan, Timothy J, MD  insulin  lispro (HUMALOG ) 100 UNIT/ML KwikPen Inject 0-15 Units into the skin 3 (three) times daily.   Yes [provider]  isosorbide  mononitrate (IMDUR ) 30 MG 24 hr tablet Take 1 tablet (30 mg total) by mouth 2 (two) times daily. 02/16/24  Yes Gollan, Timothy J, MD  nitroGLYCERIN  (NITROSTAT ) 0.4 MG SL tablet Place 1 tablet (0.4 mg total) under the tongue every 5 (five) minutes as needed for chest pain. 02/16/24  Yes Gollan, Timothy J, MD  nortriptyline  (PAMELOR ) 50 MG capsule Take 50 mg by mouth at bedtime.   Yes [provider]  oxyCODONE  (OXY IR/ROXICODONE ) 5 MG immediate release tablet Take 1 tablet (5 mg total) by mouth 2 (two) times daily as needed for severe pain (pain score 7-10). 04/13/24  Yes Abernathy, Mardy, NP  pantoprazole  (PROTONIX ) 40 MG tablet Take 1 tablet (40 mg total) by mouth 2 (two) times daily before a meal. 11/19/23  Yes Abernathy,  Alyssa, NP  torsemide  (DEMADEX ) 20 MG tablet Take 1 tablet (20 mg total) by mouth 2 (two) times daily. Patient taking differently: Take 40-60 mg by mouth 2 (two) times daily. 60 mg every morning and 40 mg 12 hours later 02/16/24  Yes Gollan, Timothy J, MD  TOUJEO  MAX SOLOSTAR 300 UNIT/ML Solostar Pen Inject 60 Units into the skin daily.   Yes [provider]  cloNIDine  (CATAPRES ) 0.1 MG tablet Take 1 tablet (0.1 mg total) by mouth 2 (two) times  daily as needed (pressure >175). Patient not taking: Reported on 05/31/2024 02/16/24   Gollan, Timothy J, MD  Insulin  Pen Needle (B-D ULTRAFINE III SHORT PEN) 31G X 8 MM MISC To use with pen basal and mealtime coverage insulin  pens 5 times daily. DX: E11.65 04/17/20   Boscia, Heather E, NP    Inpatient Medications: Scheduled Meds:  amLODipine   10 mg Oral Daily   aspirin  EC  81 mg Oral Daily   carvedilol   3.125 mg Oral BID WC   [START ON 06/02/2024] DAPTOmycin   500 mg Intravenous QODAY   ezetimibe   10 mg Oral Daily   fluconazole   200 mg Oral Daily   gentamicin  cream  1 Application Topical Daily   hydrALAZINE   100 mg Oral BID   insulin  aspart  0-6 Units Subcutaneous Q4H   torsemide   20 mg Oral BID   Continuous Infusions:  cefTRIAXone  (ROCEPHIN )  IV 2 g (06/01/24 1232)   dialysis solution 2.5% low-MG/low-CA Stopped (06/01/24 1233)   heparin  1,100 Units/hr (06/01/24 0527)   PRN Meds: acetaminophen , albuterol , ALPRAZolam , nitroGLYCERIN , ondansetron  (ZOFRAN ) IV, oxyCODONE   Allergies:  Allergies[1]  Social History:   Social History   Socioeconomic History   Marital status: Married    Spouse name: Roxanne   Number of children: Not on file   Years of education: Not on file   Highest education level: Not on file  Occupational History   Not on file  Tobacco Use   Smoking status: Every Day    Current packs/day: 0.25    Average packs/day: 0.3 packs/day for 39.0 years (9.8 ttl pk-yrs)    Types: Cigarettes    Passive exposure: Past    Smokeless tobacco: Never  Vaping Use   Vaping status: Never Used  Substance and Sexual Activity   Alcohol use: Not Currently    Comment: very rarely - wine   Drug use: Never   Sexual activity: Not Currently  Other Topics Concern   Not on file  Social History Narrative   Not on file   Social Drivers of Health   Tobacco Use: High Risk (05/31/2024)   Patient History    Smoking Tobacco Use: Every Day    Smokeless Tobacco Use: Never    Passive Exposure: Past  Financial Resource Strain: Low Risk  (10/30/2023)   Received from [facility]   Overall Financial Resource Strain (CARDIA)    Difficulty of Paying Living Expenses: Not hard at all  Food Insecurity: No Food Insecurity (05/18/2024)   Epic    Worried About Radiation Protection Practitioner of Food in the Last Year: Never true    Ran Out of Food in the Last Year: Never true  Transportation Needs: No Transportation Needs (05/18/2024)   Epic    Lack of Transportation (Medical): No    Lack of Transportation (Non-Medical): No  Physical Activity: Unknown (09/09/2021)   Received from [facility]   Exercise Vital Sign    On average, how many days per week do you engage in moderate to strenuous exercise (like a brisk walk)?: 0 days    Minutes of Exercise per Session: Not on file  Stress: No Stress Concern Present (02/23/2023)   [employer] of Occupational Health - Occupational Stress Questionnaire    Feeling of Stress : Only a little  Social Connections: Unknown (05/18/2024)   Social Connection and Isolation Panel    Frequency of Communication with Friends and Family: More than three times a week    Frequency of Social Gatherings with  Friends and Family: More than three times a week    Attends Religious Services: Patient declined    Active Member of Clubs or Organizations: Patient declined    Attends Banker Meetings: Patient declined    Marital Status: Married  Catering Manager Violence: Not At Risk (05/18/2024)    Epic    Fear of Current or Ex-Partner: No    Emotionally Abused: No    Physically Abused: No    Sexually Abused: No  Depression (PHQ2-9): Low Risk (01/22/2023)   Depression (PHQ2-9)    PHQ-2 Score: 0  Alcohol Screen: Not on file  Housing: Low Risk (05/18/2024)   Epic    Unable to Pay for Housing in the Last Year: No    Number of Times Moved in the Last Year: 0    Homeless in the Last Year: No  Utilities: Not At Risk (05/18/2024)   Epic    Threatened with loss of utilities: No  Health Literacy: Adequate Health Literacy (02/23/2023)   B1300 Health Literacy    Frequency of need for help with medical instructions: Never     Family History:   Family History  Problem Relation Age of Onset   Diabetes Mother    Heart disease Father     ROS:  Review of Systems  Unable to perform ROS: Mental status change      Physical Exam/Data:   Vitals:   06/01/24 0700 06/01/24 0730 06/01/24 1030 06/01/24 1212  BP: 134/65 137/78 129/72   Pulse: 66 65 (!) 58   Resp: (!) 22 10 15    Temp:    98.2 F (36.8 C)  TempSrc:    Oral  SpO2: 98% 97% 96%   Weight:      Height:       No intake or output data in the 24 hours ending 06/01/24 1241 Filed Weights   05/31/24 1812  Weight: 68.9 kg   Body mass index is 20.05 kg/m.   Physical Exam: General: Chronically ill-appearing, appears older than stated age, in no acute distress. Head: Normocephalic, atraumatic, sclera non-icteric, no xanthomas, nares without discharge.  Neck: Negative for carotid bruits. JVD not elevated. Lungs: Clear bilaterally to auscultation without wheezes, rales, or rhonchi. Breathing is unlabored. Heart: IRIR with S1 S2. No murmurs, rubs, or gallops appreciated. Abdomen: Soft, non-tender, non-distended with normoactive bowel sounds. No hepatomegaly. No rebound/guarding. No obvious abdominal masses. Msk:  Strength and tone appear diminished for age. Extremities: No clubbing or cyanosis. No edema.  Neuro: Alert, not  oriented. No facial asymmetry. No focal deficit. Moves all extremities spontaneously. Psych: Intermittently responds to questions.   EKG:  The EKG was personally reviewed and demonstrates: atrial flutter with variable AV block, 88 bpm, LVH, and nonspecific ST-T changes Telemetry:  Telemetry was personally reviewed and demonstrates: Atrial flutter with variable AV block with rates in the low 100s bpm trending to the 60s to 70s  Weights: Filed Weights   05/31/24 1812  Weight: 68.9 kg    Relevant CV Studies:  Lexiscan  MPI 04/17/2023: Pharmacological myocardial perfusion imaging study with no significant  ischemia Fixed inferolateral defect consistent with prior MI Normal wall motion, EF estimated at 46% No EKG changes concerning for ischemia at peak stress or in recovery. CT attenuation correction images with bilateral pleural effusions, right greater than left, three-vessel coronary calcification, mild diffuse aortic atherosclerosis Low risk scan __________  2D echo 03/25/2023: 1. Left ventricular ejection fraction, by estimation, is 35 to 40%. The  left ventricle  has moderately decreased function. There is mild left  ventricular hypertrophy. Left ventricular diastolic parameters are  consistent with Grade III diastolic  dysfunction (restrictive). Elevated left atrial pressure. There is mild  global hypokinesis with severe hypokinesis of the left ventricular,  mid-apical inferior wall and inferolateral wall.   2. Right ventricular systolic function is mildly reduced. The right  ventricular size is normal. There is normal pulmonary artery systolic  pressure.   3. Left atrial size was mildly dilated.   4. The mitral valve is degenerative. Moderate to severe mitral valve  regurgitation.   5. The aortic valve is tricuspid. Aortic valve regurgitation is trivial.  Aortic valve sclerosis is present, with no evidence of aortic valve  stenosis.   6. The inferior vena cava is dilated in  size with >50% respiratory  variability, suggesting right atrial pressure of 8 mmHg.    Laboratory Data:  Chemistry Recent Labs  Lab 05/31/24 1900  NA 129*  K 3.7  CL 87*  CO2 23  GLUCOSE 178*  BUN 53*  CREATININE 6.60*  CALCIUM  9.0  GFRNONAA 9*  ANIONGAP 20*    No results for input(s): PROT, ALBUMIN , AST, ALT, ALKPHOS, BILITOT in the last 168 hours. Hematology Recent Labs  Lab 05/31/24 1816 06/01/24 0322  WBC 11.9* 11.2*  RBC 4.11* 3.63*  HGB 13.1 11.6*  HCT 37.8* 33.7*  MCV 92.0 92.8  MCH 31.9 32.0  MCHC 34.7 34.4  RDW 15.1 14.9  PLT 246 225   Cardiac EnzymesNo results for input(s): TROPONINI in the last 168 hours. No results for input(s): TROPIPOC in the last 168 hours.  BNP Recent Labs  Lab 05/31/24 1900  PROBNP >35,000.0*    DDimer No results for input(s): DDIMER in the last 168 hours.  Radiology/Studies:  DG Chest 2 View Result Date: 05/31/2024 IMPRESSION: 1. No active cardiopulmonary disease. Electronically signed by: Franky Crease MD 05/31/2024 07:15 PM EST RP Workstation: [id number]    Assessment and Plan:   1.  New onset atrial flutter: - Appears to be largely asymptomatic - Continue rate control with carvedilol  3.125 mg twice daily for now - Given change in mental status and with underlying comorbidities, defer TEE-guided DCCV for now, this can be revisited pending clinical course - Echo pending - Check TSH - Heparin  drip for now, unclear if he would be a DOAC candidate moving forward - CHA2DS2-VASc at least 5 (CHF, HTN, age x 1, DM, vascular disease)  2.  CAD status post CABG with elevated high-sensitivity troponin: - Mildly elevated and flat trending high-sensitivity troponin peaking at 689 and is currently down-trending - It is unclear at this time if this is reflective of supply/demand ischemia in the setting of atrial flutter with underlying cervical discitis and ESRD versus NSTEMI - Nonetheless, the patient is not  currently an invasive ischemic evaluation candidate due to mental status change and underlying comorbidities - Echo pending - Currently being maintained on heparin  drip as outlined above for new onset atrial flutter which will be continued - LP(a) pending - ASA 81 mg  3.  HFrEF: - He does not appear volume overloaded, and in fact may appear somewhat volume depleted - Elevated proBNP in the setting of end-stage renal disease - Volume management per dialysis - PTA carvedilol  3.125 mg twice daily - Moving forward, could consider reduction/discontinuation of amlodipine  to allow for escalation of GDMT as able - Has previously been unable to tolerate higher doses of Entresto due to hyperkalemia with underlying renal disease  4.  ESRD on PD with anemia of chronic disease: - Management per nephrology  5.  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  HTN: - Blood pressure currently well-controlled - Remains on carvedilol  and hydralazine   7.  HLD with statin intolerance: - LDL 35 in 10/2023 - PTA ezetimibe  10 mg       For questions or updates, please contact [facility] Please consult [email or web address] for contact info under Cardiology/STEMI.   Signed, Bernardino Bring, PA-C [facility] Pager: [phone] 06/01/2024, 12:41 PM       [1]  Allergies Allergen Reactions   Iodinated Contrast Media Other (See Comments)    Kidney disease  Kidney disease  Kidney disease  Kidney disease   Other Other (See Comments)    Pain  With joint stiffness     Statins Other (See Comments)    Pain  With joint stiffness   Pain  With joint stiffness  Other Reaction(s): Joint Pain   Pregabalin Other (See Comments)    Generalized aches and pains Generalized aches and pains Generalized aches and pains Generalized aches and pains Generalized aches and pains   Sacubitril-Valsartan Other (See Comments)    Hyperkalemia  Hyperkalemia Hyperkalemia  Hyperkalemia    Atorvastatin Other (See Comments)    Muscle aches Muscle aches   Pravastatin Other (See Comments)    Muscle aches Muscle aches   Rosuvastatin Other (See Comments)    Muscle Aches Other reaction(s): JOINT PAIN Other reaction(s): JOINT PAIN Muscle Aches    "

## 2024-06-01 NOTE — Plan of Care (Signed)
 PMT consult noted for goals of care.  In to see patient, wife at bedside.  Patient is confused at this time.  He is restless and sitting towards edge and laying back down sideways.  His wife discusses difficulty with his care, and waiting for PD to be performed.  Nursing into bedside for catheterization as patient is retaining.  PMT will follow-up at a later time.

## 2024-06-01 NOTE — Progress Notes (Signed)
 PD pt receives outpt care at [facility]. Navigator following to assist with any HD needs.   Suzen Satchel Dialysis Navigator [phone]

## 2024-06-01 NOTE — Hospital Course (Addendum)
 Partly taken from H&P.  [name] [name] is a 64 y.o. 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  Patient developed sudden onset chest discomfort and shortness of breath.  With EMS patient was in A-fib.  On presentation mild tachycardia at 105, otherwise stable vital.  Labs notable for troponin of 639, proBNP > 35,000, leukocytosis at 11.9.  Hyponatremia with sodium at 129, potassium 3.4 and magnesium of 2.1 EKG with atrial flutter, nonspecific ST-T wave changes. CXR was negative for any acute abnormality.  Patient was given aspirin  and started on heparin  infusion.  Cardiology was consulted.  1/6: Vital stable, troponin 639>> 689, small improvement in leukocytosis to 11.2, A1c of 7.5 Echocardiogram with worsening EF now at 20 to 25%, grade 2 diastolic dysfunction, global hypokinesis, also found to have severely reduced right ventricular systolic function, probably severe aortic stenosis. Unable to urinate-did one-time In-N-Out catheter with removal of 1100 mL of urine, if he continue with unable to void-might need a Foley catheter. Cardiology is on board.  Per wife patient with declining overall health and functional status, refusing to eat and losing weight. Palliative care was consulted to discuss goals of care.  1/7: Patient more alert.  Wife would like to avoid Xanax .  She wants placement as getting difficult for her to take care of him.  PT and OT ordered. He was given sodium Theophylline at for calciphylaxis by  nephrology today.  Will complete the course of antibiotic for his prior infection.  Cardiology stopped his amlodipine  and switch to Imdur  and hydralazine  due to decrease in EF.  1/8: Patient again restless and confused.  Getting PD during rounds.  Palliative care met with family and they decided to transition him to comfort care.  They are interested in hospice facility.  Hospice was consulted.  1/9: Patient with intermittent restlessness and remained confused.  Hospice is monitoring his symptom burden for another day or 2 before deciding about taking him to hospice facility.  Wife is very overwhelmed stating that she cannot take care of him at this point.  1/10: Patient currently stable, wife now wants him to be home with hospice.  Need to have equipment delivered before discharge.  1/11: Patient with some worsening buttock pain, wife was concerned that he had incision and drainage of his prior abscess and it is reoccurring.  Was evaluated by general surgery again, no drainable abscess.  Likely pressure injuries. Likely will go home with hospice tomorrow after equipment delivery.  Need to avoid IV medications as wife wants to see how he does with p.o. only.

## 2024-06-01 NOTE — ED Notes (Signed)
 Pt yelling for help sitting on edge of the bed. States he needs to urinate and is in pain. Attempted to help however pt frustrated. Eventually pt declined needing to urinate and just wanted his brief off and oxygen on for comfort. Attempted to coach pt through therapeutic breathing, pt declined. Assisted with removing pants and brief. Pt reports significant pain to hands. Pt given PRN meds. New brief applied. Pt also given non-slip sock. Repositioned back into bed.

## 2024-06-01 NOTE — Consult Note (Signed)
 Pharmacy Consult Note - Anticoagulation  Pharmacy Consult for heparin  infusion Indication: chest pain/ACS Allergies[1]  PATIENT MEASUREMENTS: Height: 6' 1 (185.4 cm) Weight: 68.9 kg (152 lb) IBW/kg (Calculated) : 79.9 HEPARIN  DW (KG): 68.9  VITAL SIGNS: Temp: 98 F (36.7 C) (01/06 0300) Temp Source: Oral (01/06 0300) BP: 102/82 (01/06 0300) Pulse Rate: 68 (01/06 0300)  Recent Labs    05/31/24 1816 05/31/24 1900 05/31/24 1956 05/31/24 2003 06/01/24 0322  HGB 13.1  --   --   --   --   HCT 37.8*  --   --   --   --   PLT 246  --   --   --   --   APTT  --   --  32  --   --   LABPROT  --   --   --  15.4*  --   INR  --   --   --  1.2  --   HEPARINUNFRC  --   --   --   --  <0.10*  CREATININE  --  6.60*  --   --   --     Estimated Creatinine Clearance: 10.9 mL/min (A) (by C-G formula based on SCr of 6.6 mg/dL (H)).  PAST MEDICAL HISTORY: Past Medical History:  Diagnosis Date   Anemia    Arthritis    CAD (coronary artery disease)    a. 12/2016 s/p CABG x 4 (LIMA->LAD, VG->RCA, VG->OM1, VG->D1); b. 02/2018 MV: EF 35%, small-med inferolaterlal infarct. No ischemia.   CKD (chronic kidney disease), stage IV [facility])    Peritoneal Dialysis   Colon polyps    COPD (chronic obstructive pulmonary disease) (HCC)    Dupre's syndrome    GERD (gastroesophageal reflux disease)    HFrEF (heart failure reduced ejection fraction) (HCC)    a.) 2018 EF 35%; b.) 02/2018 EF 50%; c). 02/2019 EF 40-45%; d.) 08/2019 Echo: EF 50-55%, no rwma, GrII DD; e.) 04/2020 Echo: EF 30-35%, mod-sev glob HK. Mild LVH. G2DD. Low-nl RV fxn. Mildly dil LA. Mild MR. Mild-mod Ao sclerosis w/o stenosis; f.) TTE 02/09/2021: EF 55%; septal HK, LAE; G2DD.   History of 2019 novel coronavirus disease (COVID-19) 02/08/2021   Hyperlipidemia    Hypertension    Ischemic cardiomyopathy    a.) 02/2018 MV: EF 35%; b.) 08/2019 Echo: EF 50-55%; c.) 04/2020 Echo: EF 30-35%; d.) TTE 02/09/2021: EF 50-55%.   Lymphedema    Migraine     Myocardial infarction (HCC)    Neuropathy    PAD (peripheral artery disease)    a. 04/2017 Aortoiliac duplex: R iliac dzs; b. 03/2020 LE Duplex: mild bilat atherosclerosis throughout. Patent vessels.   Pneumonia    Retinopathy    S/P CABG x 4    a.)  4v CABG: LIMA->LAD, SVG->RCA, SVG->OM1, SVG->D1   Sleep apnea    a.) does not require nocturnal PAP therapy   Statin intolerance    Stomach ulcer    T2DM (type 2 diabetes mellitus) (HCC)     Medications:  (Not in a hospital admission)  Scheduled:   amLODipine   10 mg Oral Daily   aspirin  EC  81 mg Oral Daily   carvedilol   3.125 mg Oral BID WC   [START ON 06/02/2024] DAPTOmycin   500 mg Intravenous QODAY   ezetimibe   10 mg Oral Daily   fluconazole   200 mg Oral Daily   hydrALAZINE   100 mg Oral BID   insulin  aspart  0-6 Units Subcutaneous Q4H  torsemide   20 mg Oral BID   Infusions:   cefTRIAXone  (ROCEPHIN )  IV     heparin  850 Units/hr (05/31/24 2119)   PRN:  Anti-infectives (From admission, onward)    Start     Dose/Rate Route Frequency Ordered Stop   06/02/24 1400  DAPTOmycin  (CUBICIN ) injection 500 mg       Note to Pharmacy: HOME INFUSION 500 MG IVP EVERY OTHER DAY     500 mg Intravenous Every other day 06/01/24 0046     06/01/24 1200  cefTRIAXone  (ROCEPHIN ) 2 g in sodium chloride  0.9 % 100 mL IVPB        2 g 200 mL/hr over 30 Minutes Intravenous Every 24 hours 06/01/24 0046     06/01/24 1000  fluconazole  (DIFLUCAN ) tablet 200 mg        200 mg Oral Daily 05/31/24 2141         ASSESSMENT: 64 y.o. 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 Troponin elevated at 639 on admission. EKG on admission showing aflutter with AV block and prolonged QT. Patient is not on chronic anticoagulation per chart review. Pharmacy has been consulted to initiate and manage heparin  intravenous  infusion.   Goal(s) of therapy: Heparin  level 0.3 - 0.7 units/mL aPTT 66 - 102 seconds Monitor platelets by anticoagulation protocol: Yes   Baseline anticoagulation labs: Recent Labs    05/31/24 1816 05/31/24 1956 05/31/24 2003  APTT  --  32  --   INR  --   --  1.2  HGB 13.1  --   --   PLT 246  --   --   Baseline INR and aPTT pending  01/06 0322 HL <0.1, subtherapeutic  PLAN: Bolus 2100 units x 1 Increase heparin  infusion to 1100 unit x 1 Recheck HL in 8 hrs after rate change CBC daily while on heparin   Rankin CANDIE Dills, PharmD, [facility] 06/01/2024 5:20 AM      [1]  Allergies Allergen Reactions   Iodinated Contrast Media Other (See Comments)    Kidney disease  Kidney disease  Kidney disease  Kidney disease   Other Other (See Comments)    Pain  With joint stiffness     Statins Other (See Comments)    Pain  With joint stiffness   Pain  With joint stiffness  Other Reaction(s): Joint Pain   Pregabalin Other (See Comments)    Generalized aches and pains Generalized aches and pains Generalized aches and pains Generalized aches and pains Generalized aches and pains   Sacubitril-Valsartan Other (See Comments)    Hyperkalemia  Hyperkalemia Hyperkalemia  Hyperkalemia   Atorvastatin Other (See Comments)    Muscle aches Muscle aches   Pravastatin Other (See Comments)    Muscle aches Muscle aches   Rosuvastatin Other (See Comments)    Muscle Aches Other reaction(s): JOINT PAIN Other reaction(s): JOINT PAIN Muscle Aches

## 2024-06-01 NOTE — ED Notes (Signed)
 Pt resting in bed, eyes closed, respirations even, non labored, NAD noted, call bell within reach, bed in lowest, locked position

## 2024-06-01 NOTE — Progress Notes (Signed)
 " [facility]  ROUNDING NOTE   Subjective:   [name] [name]  is a 64 year old male with past medical conditions including hypertension, poorly controlled diabetes with neuropathy, GERD, COPD, CAD, and end-stage renal disease on peritoneal dialysis. Presents to the ED for shortness of breath and has been admitted for NSTEMI (non-ST elevated myocardial infarction) [facility]) [I21.4]  Patient is known to our practice and is supervised at the [facility] clinic by Dr Dennise. Last dialysis treatment received 2 days ago. Patient sitting up in bed, very difficult to arouse. No family at bedside.   Labs concerning for sodium 129, BUN 53, creatinine 6.6 with GFR 9, BNP greater than 35,000, troponin 639, and white count 11.9.  Chest x-ray negative.  Echo at bedside.  We have been consulted to manage dialysis needs.   Objective:  Vital signs in last 24 hours:  Temp:  [97.7 F (36.5 C)-98.2 F (36.8 C)] 98.2 F (36.8 C) (01/06 1212) Pulse Rate:  [58-105] 58 (01/06 1030) Resp:  [10-22] 15 (01/06 1030) BP: (102-142)/(65-90) 129/72 (01/06 1030) SpO2:  [93 %-100 %] 96 % (01/06 1030) Weight:  [68.9 kg] 68.9 kg (01/05 1812)  Weight change:  Filed Weights   05/31/24 1812  Weight: 68.9 kg    Intake/Output: No intake/output data recorded.   Intake/Output this shift:  No intake/output data recorded.  Physical Exam: General: NAD  Head: Normocephalic, atraumatic.   Eyes: Anicteric  Lungs:  Clear to auscultation, room air  Heart: Regular rate and rhythm  Abdomen:  Soft, nontender. PD catheter  Extremities:  No peripheral edema.  Neurologic: Somnolent  Skin: Warm,dry, no rash, RLE wound  Access: PDC    Basic Metabolic Panel: Recent Labs  Lab 05/31/24 1900  NA 129*  K 3.7  CL 87*  CO2 23  GLUCOSE 178*  BUN 53*  CREATININE 6.60*  CALCIUM  9.0  MG 2.1    Liver Function Tests: No results for input(s): AST, ALT, ALKPHOS, BILITOT, PROT, ALBUMIN  in the last 168  hours.  No results for input(s): LIPASE, AMYLASE in the last 168 hours. No results for input(s): AMMONIA in the last 168 hours.  CBC: Recent Labs  Lab 05/31/24 1816 06/01/24 0322  WBC 11.9* 11.2*  HGB 13.1 11.6*  HCT 37.8* 33.7*  MCV 92.0 92.8  PLT 246 225    Cardiac Enzymes: No results for input(s): CKTOTAL, CKMB, CKMBINDEX, TROPONINI in the last 168 hours.   BNP: Invalid input(s): POCBNP  CBG: Recent Labs  Lab 05/31/24 2243 06/01/24 0419 06/01/24 0749 06/01/24 1150  GLUCAP 189* 146* 111* 142*    Microbiology: Results for orders placed or performed during the hospital encounter of 05/18/24  Blood culture (single)     Status: None   Collection Time: 05/17/24  8:06 PM   Specimen: BLOOD  Result Value Ref Range Status   Specimen Description BLOOD RIGHT ANTECUBITAL  Final   Special Requests   Final    BOTTLES DRAWN AEROBIC AND ANAEROBIC Blood Culture adequate volume   Culture   Final    NO GROWTH 5 DAYS Performed at [facility], [address], [place], [place] [address]    Report Status 05/23/2024 FINAL  Final  Blood culture (routine x 2)     Status: None   Collection Time: 05/18/24  1:42 AM   Specimen: BLOOD  Result Value Ref Range Status   Specimen Description BLOOD BLOOD LEFT ARM  Final   Special Requests   Final    BOTTLES DRAWN  AEROBIC AND ANAEROBIC Blood Culture adequate volume   Culture   Final    NO GROWTH 5 DAYS Performed at [facility], [address] [place], [place] [address]    Report Status 05/23/2024 FINAL  Final  Blood culture (routine x 2)     Status: None   Collection Time: 05/18/24  3:08 PM   Specimen: Right Antecubital; Blood  Result Value Ref Range Status   Specimen Description RIGHT ANTECUBITAL  Final   Special Requests   Final    BOTTLES DRAWN AEROBIC AND ANAEROBIC Blood Culture results may not be optimal due to an inadequate volume of blood received in culture bottles   Culture   Final     NO GROWTH 5 DAYS Performed at [facility], [address], [place], [place] [address]    Report Status 05/23/2024 FINAL  Final    Coagulation Studies: Recent Labs    05/31/24 2003  LABPROT 15.4*  INR 1.2    Urinalysis: No results for input(s): COLORURINE, LABSPEC, PHURINE, GLUCOSEU, HGBUR, BILIRUBINUR, KETONESUR, PROTEINUR, UROBILINOGEN, NITRITE, LEUKOCYTESUR in the last 72 hours.  Invalid input(s): APPERANCEUR    Imaging: DG Chest 2 View Result Date: 05/31/2024 EXAM: 2 VIEW(S) XRAY OF THE CHEST 05/31/2024 06:28:00 PM COMPARISON: 04/08/2024 CLINICAL HISTORY: sob FINDINGS: LINES, TUBES AND DEVICES: Left PICC line in place with the tip in the SVC. LUNGS AND PLEURA: No focal pulmonary opacity. No pleural effusion. No pneumothorax. HEART AND MEDIASTINUM: Prior CABG. Aortic atherosclerosis. No acute abnormality of the cardiac and mediastinal silhouettes. BONES AND SOFT TISSUES: No acute osseous abnormality. IMPRESSION: 1. No active cardiopulmonary disease. Electronically signed by: Kevin Dover MD 05/31/2024 07:15 PM EST RP Workstation: [id number]     Medications:    cefTRIAXone  (ROCEPHIN )  IV     dialysis solution 2.5% low-MG/low-CA     heparin  1,100 Units/hr (06/01/24 0527)    amLODipine   10 mg Oral Daily   aspirin  EC  81 mg Oral Daily   carvedilol   3.125 mg Oral BID WC   [START ON 06/02/2024] DAPTOmycin   500 mg Intravenous QODAY   ezetimibe   10 mg Oral Daily   fluconazole   200 mg Oral Daily   gentamicin  cream  1 Application Topical Daily   hydrALAZINE   100 mg Oral BID   insulin  aspart  0-6 Units Subcutaneous Q4H   torsemide   20 mg Oral BID   acetaminophen , albuterol , ALPRAZolam , nitroGLYCERIN , ondansetron  (ZOFRAN ) IV, oxyCODONE   Assessment/ Plan:  [name] is a 64 y.o.  male with past medical conditions including hypertension, poorly controlled diabetes with neuropathy, GERD, COPD, CAD, and end-stage renal disease on peritoneal  dialysis. Presents to the ED for evaluation of RLE wound.  End stage renal disease on peritoneal dialysis.  Scheduled to perform manual peritoneal dialysis treatments however wife requesting dialysate be warmed and microwave.  Notified family and spouse this is not protocol therefore cannot be performed.  Patient and family are agreeable to cycler treatments.  Will perform 2 daytime dwells with extended nighttime dwell with echo dextran, provided by family.  2. Wound infection, calciphylaxis.  Patient recently prescribed sodium thiosulfate  for treatments.  Will continue this treatment via IV during this admission.  Wound care consulted.  3.  NSTEMI, elevated troponins.  Cardiology consulted.  Echo pending.  4. Anemia of chronic kidney disease Lab Results  Component Value Date   HGB 11.6 (L) 06/01/2024    Hgb acceptable  5. Hypertension with chronic kidney disease. Prescribed amlodipine , carvedilol , clonidine , isosorbide   and torsemide .     LOS: 0 Dayane Hillenburg 1/6/202612:25 PM   "

## 2024-06-01 NOTE — Progress Notes (Signed)
 " Progress Note   Patient: [name] [name] FMW:[id number] DOB: [birth year] DOA: 05/31/2024     0 DOS: the patient was seen and examined on 06/01/2024   Brief hospital course: Partly taken from H&P.  [name] [name] is a 64 y.o. 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  Patient developed sudden onset chest discomfort and shortness of breath.  With EMS patient was in A-fib.  On presentation mild tachycardia at 105, otherwise stable vital.  Labs notable for troponin of 639, proBNP > 35,000, leukocytosis at 11.9.  Hyponatremia with sodium at 129, potassium 3.4 and magnesium of 2.1 EKG with atrial flutter, nonspecific ST-T wave changes. CXR was negative for any acute abnormality.  Patient was given aspirin  and started on heparin  infusion.  Cardiology was consulted.  1/6: Vital stable, troponin 639>> 689, small improvement in leukocytosis to 11.2, A1c of 7.5 Echocardiogram with worsening EF now at 20 to 25%, grade 2 diastolic dysfunction, global hypokinesis, also found to have severely reduced right ventricular systolic function, probably severe aortic stenosis. Unable to urinate-did one-time In-N-Out catheter with removal of 1100 mL of urine, if he continue with unable to void-might need a Foley catheter. Cardiology is on board.  Per wife patient with declining overall health and functional status, refusing to eat and losing weight. Palliative care was consulted to discuss goals of care.  Assessment and Plan: * NSTEMI  (non-ST elevated myocardial infarction) (HCC) CAD with history of four-vessel CABG Patient with episode of shortness of breath probable chest discomfort, nonspecific EKG, elevated troponin in the 600s Received aspirin  in the ED Continue heparin  infusion Continue aspirin , carvedilol , nitroglycerin  sublingual as needed chest pain, Zetia  and Repatha  Cardiology is on board, recommending continue heparin  infusion. Echocardiogram with for the worsening of EF not to 20 to 25%, global hypokinesis.  Possible New onset atrial fibrillation (HCC) EKG with concern of new onset atrial fibrillation/flutter, CHA2DS2-VASc at least 5 - Likely not a good candidate for DCCV but cardiology will decide -Continue with heparin  infusion for now, cardiology to decide about continuing anticoagulation based on his significant comorbidities and poor underlying functional status -Continue to monitor -Continue with Coreg   Calciphylaxis with multiple nonhealing ulcer, with fat layer exposed (HCC) Patient with mild multiple wounds, followed at wound care center Continue pain control Continue wound care  ESRD on peritoneal dialysis [facility]) Nephrology consulted for continuation of dialysis- -PD dialysis with the help of nephrology  Discitis of cervical region On current long-term antibiotics for discitis treatment  Continue ceftriaxone , daptomycin  and fluconazole  via PICC line - Patient just had 1 more day of antibiotics left per ID pharmacy  Chronic systolic heart failure (HCC) Clinically euvolemic, repeat echocardiogram with further decrease of EF to 20 to 25%. Continue GDMT  Essential hypertension Continue home meds  COPD (chronic obstructive pulmonary disease) (HCC) Not acutely exacerbated Continue home inhalers with DuoNebs as needed  Depression with anxiety Continue home alprazolam  and nortriptyline   Type 2 diabetes mellitus with diabetic polyneuropathy, with long-term current use of insulin   (HCC) Continue basal insulin  with sliding scale coverage  Chronic  pain syndrome Continue as needed oxycodone    Subjective: Patient was seen and examined today.  Denies any chest pain or shortness of breath.  Appears very lethargic.  Per wife he was given Xanax  earlier due to agitation and he always become very lethargic and sleepy with that.  Per wife patient with significant decline and refusing to eat and drink most of the time and losing weight.  Physical Exam: Vitals:   06/01/24 1430 06/01/24 1500 06/01/24 1530 06/01/24 1600  BP: 118/68 111/61 115/68 134/75  Pulse: 65     Resp: 11 11 12 11   Temp:      TempSrc:      SpO2: 98%   100%  Weight:      Height:       General.  Very frail and malnourished elderly man, in no acute distress.  Looks much older than stated age Pulmonary.  Lungs clear bilaterally, normal respiratory effort. CV.  Regular rate and rhythm. Abdomen.  Soft, nontender, nondistended, BS positive. CNS.  Somnolent but arousable.  No focal neurologic deficit. Extremities.  No edema, no cyanosis, pulses intact and symmetrical.   Data Reviewed: Prior data reviewed  Family Communication: Discussed with wife at bedside  Disposition: Status is: Inpatient Remains inpatient appropriate because: Severity of illness  Planned Discharge Destination: To be determined  DVT prophylaxis.  Heparin  infusion Time spent: 50 minutes  This record has been created using Conservation officer, historic buildings. Errors have been sought and corrected,but may not always be located. Such creation errors do not reflect on the standard of care.   Author: Amaryllis Dare, MD 06/01/2024 5:07 PM  For on call review [email or web address].  "

## 2024-06-01 NOTE — ED Notes (Signed)
"  Dr Cleatus at bedside.  "

## 2024-06-01 NOTE — Consult Note (Signed)
 Pharmacy Consult Note - Anticoagulation  Pharmacy Consult for heparin  infusion Indication: chest pain/ACS Allergies[1]  PATIENT MEASUREMENTS: Height: 6' 1 (185.4 cm) Weight: 68.9 kg (152 lb) IBW/kg (Calculated) : 79.9 HEPARIN  DW (KG): 68.9  VITAL SIGNS: Temp: 98.2 F (36.8 C) (01/06 1212) Temp Source: Oral (01/06 1212) BP: 142/68 (01/06 1726) Pulse Rate: 51 (01/06 1726)  Recent Labs    05/31/24 1816 05/31/24 1900 05/31/24 1956 05/31/24 2003 06/01/24 0322 06/01/24 1653  HGB  --   --   --   --  11.6*  --   HCT  --   --   --   --  33.7*  --   PLT  --   --   --   --  225  --   APTT  --   --  32  --   --   --   LABPROT  --   --   --  15.4*  --   --   INR  --   --   --  1.2  --   --   HEPARINUNFRC   < >  --   --   --  <0.10* 0.30  CREATININE  --  6.60*  --   --   --   --    < > = values in this interval not displayed.    Estimated Creatinine Clearance: 10.9 mL/min (A) (by C-G formula based on SCr of 6.6 mg/dL (H)).  PAST MEDICAL HISTORY: Past Medical History:  Diagnosis Date   Anemia    Arthritis    CAD (coronary artery disease)    a. 12/2016 s/p CABG x 4 (LIMA->LAD, VG->RCA, VG->OM1, VG->D1); b. 02/2018 MV: EF 35%, small-med inferolaterlal infarct. No ischemia.   CKD (chronic kidney disease), stage IV [facility])    Peritoneal Dialysis   Colon polyps    COPD (chronic obstructive pulmonary disease) (HCC)    Dupre's syndrome    GERD (gastroesophageal reflux disease)    HFrEF (heart failure reduced ejection fraction) (HCC)    a.) 2018 EF 35%; b.) 02/2018 EF 50%; c). 02/2019 EF 40-45%; d.) 08/2019 Echo: EF 50-55%, no rwma, GrII DD; e.) 04/2020 Echo: EF 30-35%, mod-sev glob HK. Mild LVH. G2DD. Low-nl RV fxn. Mildly dil LA. Mild MR. Mild-mod Ao sclerosis w/o stenosis; f.) TTE 02/09/2021: EF 55%; septal HK, LAE; G2DD.   History of 2019 novel coronavirus disease (COVID-19) 02/08/2021   Hyperlipidemia    Hypertension    Ischemic cardiomyopathy    a.) 02/2018 MV: EF 35%; b.)  08/2019 Echo: EF 50-55%; c.) 04/2020 Echo: EF 30-35%; d.) TTE 02/09/2021: EF 50-55%.   Lymphedema    Migraine    Myocardial infarction (HCC)    Neuropathy    PAD (peripheral artery disease)    a. 04/2017 Aortoiliac duplex: R iliac dzs; b. 03/2020 LE Duplex: mild bilat atherosclerosis throughout. Patent vessels.   Pneumonia    Retinopathy    S/P CABG x 4    a.)  4v CABG: LIMA->LAD, SVG->RCA, SVG->OM1, SVG->D1   Sleep apnea    a.) does not require nocturnal PAP therapy   Statin intolerance    Stomach ulcer    T2DM (type 2 diabetes mellitus) (HCC)     Medications:  Medications Prior to Admission  Medication Sig Dispense Refill Last Dose/Taking   albuterol  (VENTOLIN  HFA) 108 (90 Base) MCG/ACT inhaler Inhale 1-2 puffs into the lungs every 6 (six) hours as needed for wheezing or shortness of breath. 1 each 3 05/31/2024  ALPRAZolam  (XANAX ) 0.5 MG tablet Take 1 tablet (0.5 mg total) by mouth 2 (two) times daily as needed for anxiety. 40 tablet 0 Past Week   amLODipine  (NORVASC ) 10 MG tablet Take 1 tablet (10 mg total) by mouth daily. 90 tablet 3 05/30/2024 Morning   aspirin  EC 81 MG tablet Take 81 mg by mouth daily. Swallow whole.   05/30/2024 Morning   bisacodyl  (DULCOLAX) 5 MG EC tablet Take 10 mg by mouth 2 (two) times daily as needed for moderate constipation.   Taking As Needed   carvedilol  (COREG ) 3.125 MG tablet Take 1 tablet (3.125 mg total) by mouth 2 (two) times daily with a meal. 180 tablet 3 05/30/2024 Evening   cefTRIAXone  (ROCEPHIN ) 2 g injection 2 g daily. HOME INFUSION 2 GM IVP Q24H   05/31/2024   cholecalciferol  (VITAMIN D3) 25 MCG (1000 UNIT) tablet Take 2,000 Units by mouth 2 (two) times daily.   05/30/2024 Evening   DAPTOmycin  (CUBICIN ) 500 MG injection Inject 500 mg into the vein every other day. HOME INFUSION 500 MG IVP EVERY OTHER DAY   05/31/2024   Evolocumab  (REPATHA  SURECLICK) 140 MG/ML SOAJ INJECT 1 ML UNDER THE SKIN EVERY 14 DAYS 6 mL 3 05/30/2024   ezetimibe  (ZETIA ) 10 MG tablet  Take 1 tablet (10 mg total) by mouth daily. 90 tablet 3 05/30/2024 Morning   fluconazole  (DIFLUCAN ) 200 MG tablet Take 1 tablet (200 mg total) by mouth daily. 30 tablet 0 05/31/2024 Morning   gentamicin  cream (GARAMYCIN ) 0.1 % Apply 1 Application topically 3 (three) times daily.   05/30/2024 Evening   HUMALOG  MIX 75/25 KWIKPEN (75-25) 100 UNIT/ML KwikPen Inject 30 Units into the skin every morning. 75-25 pen injectior preceding dialysis treatment.   05/31/2024 Morning   hydrALAZINE  (APRESOLINE ) 100 MG tablet Take 1 tablet (100 mg total) by mouth 3 (three) times daily. (Patient taking differently: Take 100 mg by mouth 2 (two) times daily.) 270 tablet 3 05/30/2024 Evening   insulin  lispro (HUMALOG ) 100 UNIT/ML KwikPen Inject 0-15 Units into the skin 3 (three) times daily.   05/31/2024 Morning   isosorbide  mononitrate (IMDUR ) 30 MG 24 hr tablet Take 1 tablet (30 mg total) by mouth 2 (two) times daily. 180 tablet 3 05/30/2024 Evening   nitroGLYCERIN  (NITROSTAT ) 0.4 MG SL tablet Place 1 tablet (0.4 mg total) under the tongue every 5 (five) minutes as needed for chest pain. 25 tablet 3 Unknown   nortriptyline  (PAMELOR ) 50 MG capsule Take 50 mg by mouth at bedtime.   05/30/2024 Bedtime   oxyCODONE  (OXY IR/ROXICODONE ) 5 MG immediate release tablet Take 1 tablet (5 mg total) by mouth 2 (two) times daily as needed for severe pain (pain score 7-10). 60 tablet 0 05/31/2024 at 12:00 PM   pantoprazole  (PROTONIX ) 40 MG tablet Take 1 tablet (40 mg total) by mouth 2 (two) times daily before a meal. 180 tablet 3 05/30/2024 Evening   torsemide  (DEMADEX ) 20 MG tablet Take 1 tablet (20 mg total) by mouth 2 (two) times daily. (Patient taking differently: Take 40-60 mg by mouth 2 (two) times daily. 60 mg every morning and 40 mg 12 hours later) 180 tablet 3 05/30/2024 Evening   TOUJEO  MAX SOLOSTAR 300 UNIT/ML Solostar Pen Inject 60 Units into the skin daily.   05/30/2024 at 10:00 PM   cloNIDine  (CATAPRES ) 0.1 MG tablet Take 1 tablet (0.1 mg total) by  mouth 2 (two) times daily as needed (pressure >175). (Patient not taking: Reported on 05/31/2024) 60 tablet 3 Not Taking  Insulin  Pen Needle (B-D ULTRAFINE III SHORT PEN) 31G X 8 MM MISC To use with pen basal and mealtime coverage insulin  pens 5 times daily. DX: E11.65 500 each 1    Scheduled:   amLODipine   10 mg Oral Daily   aspirin  EC  81 mg Oral Daily   carvedilol   3.125 mg Oral BID WC   ezetimibe   10 mg Oral Daily   fluconazole   200 mg Oral Daily   gentamicin  cream  1 Application Topical Daily   hydrALAZINE   100 mg Oral BID   insulin  aspart  0-6 Units Subcutaneous Q4H   torsemide   20 mg Oral BID   Infusions:   cefTRIAXone  (ROCEPHIN )  IV Stopped (06/01/24 1302)   dialysis solution 1.5% low-MG/low-CA     dialysis solution 2.5% low-MG/low-CA     heparin  1,100 Units/hr (06/01/24 0527)   PRN:  Anti-infectives (From admission, onward)    Start     Dose/Rate Route Frequency Ordered Stop   06/02/24 1400  DAPTOmycin  (CUBICIN ) injection 500 mg  Status:  Discontinued       Note to Pharmacy: HOME INFUSION 500 MG IVP EVERY OTHER DAY     500 mg Intravenous Every other day 06/01/24 0046 06/01/24 1632   06/01/24 1200  cefTRIAXone  (ROCEPHIN ) 2 g in sodium chloride  0.9 % 100 mL IVPB        2 g 200 mL/hr over 30 Minutes Intravenous Every 24 hours 06/01/24 0046 06/03/24 1159   06/01/24 1000  fluconazole  (DIFLUCAN ) tablet 200 mg        200 mg Oral Daily 05/31/24 2141 06/03/24 0959       ASSESSMENT: 64 y.o. 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 Troponin elevated at 639 on admission. EKG on admission showing aflutter with AV block and prolonged QT. Patient is not on chronic anticoagulation per chart review. Pharmacy has been consulted to initiate and manage heparin  intravenous infusion.   Goal(s) of therapy: Heparin  level 0.3 - 0.7 units/mL aPTT 66 - 102  seconds Monitor platelets by anticoagulation protocol: Yes   01/06 0322 HL <0.1, subtherapeutic 01/06 1653 HL 0.30, therapeutic x1 @ 1100 units/hr  Hgb around historical baseline and plt stable.  PLAN: Continue heparin  infusion at 1100 units/hr Recheck HL in 8 hrs  CBC daily while on heparin   [name] [name] 06/01/2024 6:03 PM       [1]  Allergies Allergen Reactions   Iodinated Contrast Media Other (See Comments)    Kidney disease  Kidney disease  Kidney disease  Kidney disease   Other Other (See Comments)    Pain  With joint stiffness     Statins Other (See Comments)    Pain  With joint stiffness   Pain  With joint stiffness  Other Reaction(s): Joint Pain   Pregabalin Other (See Comments)    Generalized aches and pains Generalized aches and pains Generalized aches and pains Generalized aches and pains Generalized aches and pains   Sacubitril-Valsartan Other (See Comments)    Hyperkalemia  Hyperkalemia Hyperkalemia  Hyperkalemia   Atorvastatin Other (See Comments)    Muscle aches Muscle aches   Pravastatin Other (See Comments)    Muscle aches Muscle aches   Rosuvastatin Other (See Comments)    Muscle Aches Other reaction(s): JOINT PAIN Other reaction(s): JOINT PAIN Muscle Aches

## 2024-06-01 NOTE — Telephone Encounter (Signed)
 Sent message to [employer] for electric wheelchair

## 2024-06-01 NOTE — Telephone Encounter (Signed)
 Sent message

## 2024-06-01 NOTE — ED Notes (Signed)
 Bladder scanned pt to find greater then in bladder. RN did a In and Out cath with a verbal order from the provider and was able to get out of pt bladder.

## 2024-06-02 DIAGNOSIS — I700 Atherosclerosis of aorta: Secondary | ICD-10-CM | POA: Diagnosis not present

## 2024-06-02 DIAGNOSIS — I2510 Atherosclerotic heart disease of native coronary artery without angina pectoris: Secondary | ICD-10-CM | POA: Diagnosis not present

## 2024-06-02 DIAGNOSIS — E1142 Type 2 diabetes mellitus with diabetic polyneuropathy: Secondary | ICD-10-CM | POA: Diagnosis not present

## 2024-06-02 DIAGNOSIS — M4642 Discitis, unspecified, cervical region: Secondary | ICD-10-CM | POA: Diagnosis not present

## 2024-06-02 DIAGNOSIS — I5022 Chronic systolic (congestive) heart failure: Secondary | ICD-10-CM | POA: Diagnosis not present

## 2024-06-02 DIAGNOSIS — G894 Chronic pain syndrome: Secondary | ICD-10-CM | POA: Diagnosis not present

## 2024-06-02 DIAGNOSIS — R0602 Shortness of breath: Secondary | ICD-10-CM

## 2024-06-02 DIAGNOSIS — E43 Unspecified severe protein-calorie malnutrition: Secondary | ICD-10-CM | POA: Diagnosis not present

## 2024-06-02 DIAGNOSIS — Z794 Long term (current) use of insulin: Secondary | ICD-10-CM | POA: Diagnosis not present

## 2024-06-02 DIAGNOSIS — N186 End stage renal disease: Secondary | ICD-10-CM | POA: Diagnosis not present

## 2024-06-02 DIAGNOSIS — I4892 Unspecified atrial flutter: Secondary | ICD-10-CM

## 2024-06-02 DIAGNOSIS — I484 Atypical atrial flutter: Secondary | ICD-10-CM

## 2024-06-02 DIAGNOSIS — Z992 Dependence on renal dialysis: Secondary | ICD-10-CM | POA: Diagnosis not present

## 2024-06-02 LAB — GLUCOSE, CAPILLARY
Glucose-Capillary: 100 mg/dL — ABNORMAL HIGH (ref 70–99)
Glucose-Capillary: 123 mg/dL — ABNORMAL HIGH (ref 70–99)
Glucose-Capillary: 145 mg/dL — ABNORMAL HIGH (ref 70–99)
Glucose-Capillary: 165 mg/dL — ABNORMAL HIGH (ref 70–99)
Glucose-Capillary: 170 mg/dL — ABNORMAL HIGH (ref 70–99)
Glucose-Capillary: 216 mg/dL — ABNORMAL HIGH (ref 70–99)
Glucose-Capillary: 265 mg/dL — ABNORMAL HIGH (ref 70–99)
Glucose-Capillary: 87 mg/dL (ref 70–99)

## 2024-06-02 LAB — CBC WITH DIFFERENTIAL/PLATELET
Abs Immature Granulocytes: 0.09 K/uL — ABNORMAL HIGH (ref 0.00–0.07)
Basophils Absolute: 0.1 K/uL (ref 0.0–0.1)
Basophils Relative: 1 %
Eosinophils Absolute: 0.3 K/uL (ref 0.0–0.5)
Eosinophils Relative: 2 %
HCT: 37.5 % — ABNORMAL LOW (ref 39.0–52.0)
Hemoglobin: 12.5 g/dL — ABNORMAL LOW (ref 13.0–17.0)
Immature Granulocytes: 1 %
Lymphocytes Relative: 10 %
Lymphs Abs: 1.3 K/uL (ref 0.7–4.0)
MCH: 31.4 pg (ref 26.0–34.0)
MCHC: 33.3 g/dL (ref 30.0–36.0)
MCV: 94.2 fL (ref 80.0–100.0)
Monocytes Absolute: 0.6 K/uL (ref 0.1–1.0)
Monocytes Relative: 5 %
Neutro Abs: 10.6 K/uL — ABNORMAL HIGH (ref 1.7–7.7)
Neutrophils Relative %: 81 %
Platelets: 224 K/uL (ref 150–400)
RBC: 3.98 MIL/uL — ABNORMAL LOW (ref 4.22–5.81)
RDW: 15.2 % (ref 11.5–15.5)
WBC: 13 K/uL — ABNORMAL HIGH (ref 4.0–10.5)
nRBC: 0 % (ref 0.0–0.2)

## 2024-06-02 LAB — HEPARIN LEVEL (UNFRACTIONATED)
Heparin Unfractionated: 0.15 [IU]/mL — ABNORMAL LOW (ref 0.30–0.70)
Heparin Unfractionated: 0.38 [IU]/mL (ref 0.30–0.70)
Heparin Unfractionated: 0.27 [IU]/mL — ABNORMAL LOW (ref 0.30–0.70)

## 2024-06-02 LAB — HEPATIC FUNCTION PANEL
ALT: 15 U/L (ref 0–44)
AST: 29 U/L (ref 15–41)
Albumin: 3.1 g/dL — ABNORMAL LOW (ref 3.5–5.0)
Alkaline Phosphatase: 124 U/L (ref 38–126)
Bilirubin, Direct: 0.2 mg/dL (ref 0.0–0.2)
Indirect Bilirubin: 0.1 mg/dL — ABNORMAL LOW (ref 0.3–0.9)
Total Bilirubin: 0.3 mg/dL (ref 0.0–1.2)
Total Protein: 6.5 g/dL (ref 6.5–8.1)

## 2024-06-02 LAB — C-REACTIVE PROTEIN: CRP: 3.7 mg/dL — ABNORMAL HIGH

## 2024-06-02 LAB — CK: Total CK: 286 U/L (ref 49–397)

## 2024-06-02 LAB — LIPOPROTEIN A (LPA): Lipoprotein (a): <8.4 nmol/L

## 2024-06-02 MED ORDER — DM-GUAIFENESIN ER 30-600 MG PO TB12
1.0000 | ORAL_TABLET | Freq: Two times a day (BID) | ORAL | Status: DC
  Administered 2024-06-02 – 2024-06-07 (×10): 1 via ORAL
  Filled 2024-06-02 (×11): qty 1

## 2024-06-02 MED ORDER — ISOSORBIDE MONONITRATE ER 30 MG PO TB24
30.0000 mg | ORAL_TABLET | Freq: Every day | ORAL | Status: DC
  Administered 2024-06-03 – 2024-06-07 (×5): 30 mg via ORAL
  Filled 2024-06-02 (×5): qty 1

## 2024-06-02 MED ORDER — NEPRO/CARBSTEADY PO LIQD
237.0000 mL | Freq: Three times a day (TID) | ORAL | Status: DC
  Administered 2024-06-03 (×2): 237 mL via ORAL

## 2024-06-02 MED ORDER — CHLORHEXIDINE GLUCONATE CLOTH 2 % EX PADS
6.0000 | MEDICATED_PAD | Freq: Every day | CUTANEOUS | Status: DC
  Administered 2024-06-02 – 2024-06-07 (×6): 6 via TOPICAL

## 2024-06-02 MED ORDER — THIAMINE MONONITRATE 100 MG PO TABS
100.0000 mg | ORAL_TABLET | Freq: Every day | ORAL | Status: DC
  Administered 2024-06-02 – 2024-06-03 (×2): 100 mg via ORAL
  Filled 2024-06-02 (×2): qty 1

## 2024-06-02 MED ORDER — SODIUM CHLORIDE 0.9% FLUSH
10.0000 mL | Freq: Two times a day (BID) | INTRAVENOUS | Status: DC
  Administered 2024-06-02 – 2024-06-07 (×12): 10 mL

## 2024-06-02 MED ORDER — HEPARIN BOLUS VIA INFUSION
1000.0000 [IU] | Freq: Once | INTRAVENOUS | Status: AC
  Administered 2024-06-02: 1000 [IU] via INTRAVENOUS
  Filled 2024-06-02: qty 1000

## 2024-06-02 MED ORDER — HEPARIN BOLUS VIA INFUSION
2100.0000 [IU] | Freq: Once | INTRAVENOUS | Status: AC
  Administered 2024-06-02: 2100 [IU] via INTRAVENOUS
  Filled 2024-06-02: qty 2100

## 2024-06-02 MED ORDER — RENA-VITE PO TABS
1.0000 | ORAL_TABLET | Freq: Every day | ORAL | Status: DC
  Administered 2024-06-02: 1 via ORAL
  Filled 2024-06-02: qty 1

## 2024-06-02 MED ORDER — SODIUM THIOSULFATE 250 MG/ML IV SOLN
25.0000 g | INTRAVENOUS | Status: DC
  Administered 2024-06-02 – 2024-06-04 (×2): 25 g via INTRAVENOUS
  Filled 2024-06-02 (×3): qty 100

## 2024-06-02 NOTE — Progress Notes (Signed)
 Initial Nutrition Assessment  DOCUMENTATION CODES:   Severe malnutrition in context of chronic illness  INTERVENTION:   -Liberalize diet to regular for widest variety of meal selections -Feeding assistance with meals  -Nepro Shake po TID, each supplement provides 425 kcal and 19 grams protein  -Renal MVI daily -100 mg thiamine  daily x 7 days -Monitor Mg, K, and Phos and replete as needed secondary to high refeeding risk  -RD to draw labs as patient is at risk for micronutrient deficiencies given chronic wounds: vitamin C, zinc , vitamin A, and CRP (to best interpret lab values) -Case discussed with MD, RN, and palliative care. Palliative care consult pending for goals of care. May need to consider alternative means of nutrition/ hydration if this algins with goals of care. If feedings are implemented, recommend:   Initiate Osmolite 1.5 @ 20 ml/hr and increase by 10 ml every 12 hours to goal rate of 60 ml/hr.   60 ml Prosource TF20 BID  30 ml free water  flush every 4 hours    Tube feeding regimen provides 2320 kcal (100% of needs), 130 grams of protein, and 1097 ml of H2O. Total free water : 1277 ml daily  NUTRITION DIAGNOSIS:   Severe Malnutrition related to chronic illness (CHF, ESRD on PD) as evidenced by moderate fat depletion, severe fat depletion, moderate muscle depletion, severe muscle depletion.  GOAL:   Patient will meet greater than or equal to 90% of their needs  MONITOR:   PO intake, Supplement acceptance  REASON FOR ASSESSMENT:   Consult Assessment of nutrition requirement/status  ASSESSMENT:   64 y.o. 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  Patient admitted with NSTEMI.   Reviewed I/O's: +10 ml x 24 hours  Per [facility] notes, patient is followed by the wound care center as an outpatient. 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    Per H&P, patient has experienced a general decline in health over the past month, refusing to eat and losing weight.   HD nurse at bedside at time of visit, finishing hooking up PD. Patient lying in bed at time of visit. Patient confused and irritable at time of visit. RD unable to obtain history. No family at bedside.   Observed breakfast tray, which was untouched at time of visit. Noted patient consumed about 1/3 of two Vitamin water  bottles.   Case discussed with HD navigator, who confirms patient receives services from [facility] ([facility]). EDW is 80 kg. Patient is significantly below EDW. Reviewed weight history; patient has experienced a 18.1% weight loss over the past month, which is significant for time frame.   Given patient's malnutrition, weight loss, and poor oral intake, RD will liberalize diet to widen variety of meal selections and optimize oral intake. Patient also with increased risk for micronutrient deficiencies due to chronic wounds; RD will draw labs  to assess for potential deficiencies. He is also at high refeeding risk; will add thiamine  and monitor electrolyte labs. Plan of care and interventions discussed with RN, MD, and palliative care. Palliative care consult pending for goals of care. If aggressive care is warranted, may need to consider alternatives means of  nutrition/ hydration if this aligns with goals of care.   Medications reviewed and include demadex .   Lab Results  Component Value Date   HGBA1C 7.5 (H) 05/31/2024   PTA DM medications are 60 units tuojeo daily, 20 units humalog  72/25 daily, and 0-15 units inuslin lispro TID. Per outpatient endocrinology note on 11/20/23 (Dr Beryl), patient uses a dexcom CGM. Patient saw outpatient diabetes educator Francie Constable) on 11/10/20; it is difficult for patient to complete fingersticks due to severe neuropathy.    Labs reviewed: CBGS: 31-145 (inpatient orders for glycemic control are 0-6 units insulin  aspart every 4 hours).    NUTRITION - FOCUSED PHYSICAL EXAM:  Flowsheet Row Most Recent Value  Orbital Region Severe depletion  Upper Arm Region Severe depletion  Thoracic and Lumbar Region Moderate depletion  Buccal Region Moderate depletion  Temple Region Moderate depletion  Clavicle Bone Region Moderate depletion  Clavicle and Acromion Bone Region Moderate depletion  Scapular Bone Region Moderate depletion  Dorsal Hand Severe depletion  Patellar Region Severe depletion  Anterior Thigh Region Severe depletion  Posterior Calf Region Severe depletion  Edema (RD Assessment) None  Hair Reviewed  Eyes Reviewed  Mouth Reviewed  Skin Reviewed  Nails Reviewed    Diet Order:   Diet Order             Diet regular Fluid consistency: Thin  Diet effective now                   EDUCATION NEEDS:   Not appropriate for education at this time  Skin:  Skin Assessment: Skin Integrity Issues: Skin Integrity Issues:: Incisions Incisions: full thickness wound to right lower leg 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  Estimated Nutritional Needs:   Kcal:  2200-2400  Protein:  120-135 grams  Fluid:  1000 ml + UOP    Margery ORN, RD, LDN, CDCES Registered Dietitian III Certified Diabetes Care and Education Specialist If unable to reach this RD, please use RD Inpatient group chat on secure chat between hours of 8am-4 pm daily

## 2024-06-02 NOTE — Plan of Care (Signed)
 VSS. RA. Family at bedside this shift. PD done this shift. Heparin  gtt still running. Leg wound dressings done. Oxycodone  X1 for pain.  Problem: Education: Goal: Ability to describe self-care measures that may prevent or decrease complications (Diabetes Survival Skills Education) will improve Outcome: Not Progressing Goal: Individualized Educational Video(s) Outcome: Not Progressing   Problem: Coping: Goal: Ability to adjust to condition or change in health will improve Outcome: Not Progressing   Problem: Fluid Volume: Goal: Ability to maintain a balanced intake and output will improve Outcome: Not Progressing   Problem: Health Behavior/Discharge Planning: Goal: Ability to identify and utilize available resources and services will improve Outcome: Not Progressing Goal: Ability to manage health-related needs will improve Outcome: Not Progressing   Problem: Metabolic: Goal: Ability to maintain appropriate glucose levels will improve Outcome: Not Progressing   Problem: Nutritional: Goal: Maintenance of adequate nutrition will improve Outcome: Not Progressing Goal: Progress toward achieving an optimal weight will improve Outcome: Not Progressing   Problem: Skin Integrity: Goal: Risk for impaired skin integrity will decrease Outcome: Not Progressing   Problem: Tissue Perfusion: Goal: Adequacy of tissue perfusion will improve Outcome: Not Progressing   Problem: Education: Goal: Understanding of cardiac disease, CV risk reduction, and recovery process will improve Outcome: Not Progressing Goal: Individualized Educational Video(s) Outcome: Not Progressing   Problem: Activity: Goal: Ability to tolerate increased activity will improve Outcome: Not Progressing   Problem: Cardiac: Goal: Ability to achieve and maintain adequate cardiovascular perfusion will improve Outcome: Not Progressing   Problem: Health Behavior/Discharge Planning: Goal: Ability to safely manage  health-related needs after discharge will improve Outcome: Not Progressing   Problem: Education: Goal: Knowledge of General Education information will improve Description: Including pain rating scale, medication(s)/side effects and non-pharmacologic comfort measures Outcome: Not Progressing   Problem: Health Behavior/Discharge Planning: Goal: Ability to manage health-related needs will improve Outcome: Not Progressing   Problem: Clinical Measurements: Goal: Ability to maintain clinical measurements within normal limits will improve Outcome: Not Progressing Goal: Will remain free from infection Outcome: Not Progressing Goal: Diagnostic test results will improve Outcome: Not Progressing Goal: Respiratory complications will improve Outcome: Not Progressing Goal: Cardiovascular complication will be avoided Outcome: Not Progressing   Problem: Activity: Goal: Risk for activity intolerance will decrease Outcome: Not Progressing   Problem: Nutrition: Goal: Adequate nutrition will be maintained Outcome: Not Progressing   Problem: Coping: Goal: Level of anxiety will decrease Outcome: Not Progressing   Problem: Elimination: Goal: Will not experience complications related to bowel motility Outcome: Not Progressing Goal: Will not experience complications related to urinary retention Outcome: Not Progressing   Problem: Pain Managment: Goal: General experience of comfort will improve and/or be controlled Outcome: Not Progressing   Problem: Safety: Goal: Ability to remain free from injury will improve Outcome: Not Progressing   Problem: Skin Integrity: Goal: Risk for impaired skin integrity will decrease Outcome: Not Progressing

## 2024-06-02 NOTE — Plan of Care (Signed)
 PMT following. In to see patient. He is sitting in bed. He denies physical complaint, and states he just really wants to get up and walk around and stretch his legs. He stops answering questions. Will need to speak with wife who is not present at this time. PMT will reach out to wife.

## 2024-06-02 NOTE — TOC Initial Note (Addendum)
 Transition of Care [facility]) - Initial/Assessment Note    Patient Details  Name: [name] [name] MRN: [id number] Date of Birth: [birth year]  Transition of Care [employer]) CM/SW Contact:    Lauraine JAYSON Carpen, LCSW Phone Number: 06/02/2024, 10:28 AM  Clinical Narrative:  Readmission prevention screen complete. Patient only oriented to self. Wife at bedside. CSW introduced role and explained that discharge planning would be discussed. PCP is Mardy Maxin, NP. Wife drives him to appointments. Pharmacy is [facility] in [place]. No issues affording medications. Patient lives home with his wife. He is active with [facility] for nursing/IV abx. Per chart review, abx may be managed by [facility]. CSW left voicemail for liaison. Patient has an mining engineer wheelchair, RW, wheelchair, toilet riser, lift, and a chair that wife uses when going to appointments. Patient gets peritoneal dialysis which wife manages three times per day. Discussed possibility of hiring caregivers at home and overall price. No further concerns. CSW will continue to follow patient for support and facilitate return home once stable. Wife will transport him home at discharge.                1:59 pm: [facility] called back and confirmed they provide his IV abx.  Expected Discharge Plan: Home w Home Health Services Barriers to Discharge: Continued Medical Work up   Patient Goals and CMS Choice            Expected Discharge Plan and Services     Post Acute Care Choice: Resumption of Svcs/PTA Provider Living arrangements for the past 2 months: Single Family Home                           HH Arranged: RN HH Agency: [facility] Date [facility] Agency Contacted: 06/02/24   Representative spoke with at [facility] Agency: Darleene  Prior Living Arrangements/Services Living arrangements for the past 2 months: Single Family Home Lives with:: Spouse Patient language and need for interpreter reviewed:: Yes Do you feel safe going back to the  place where you live?: Yes      Need for Family Participation in Patient Care: Yes (Comment) Care giver support system in place?: Yes (comment) Current home services: DME, Home RN Criminal Activity/Legal Involvement Pertinent to Current Situation/Hospitalization: No - Comment as needed  Activities of Daily Living   ADL Screening (condition at time of admission) Independently performs ADLs?: No Does the patient have a NEW difficulty with bathing/dressing/toileting/self-feeding that is expected to last >3 days?: No Does the patient have a NEW difficulty with getting in/out of bed, walking, or climbing stairs that is expected to last >3 days?: No Does the patient have a NEW difficulty with communication that is expected to last >3 days?: No Is the patient deaf or have difficulty hearing?: No Does the patient have difficulty seeing, even when wearing glasses/contacts?: No Does the patient have difficulty concentrating, remembering, or making decisions?: No  Permission Sought/Granted Permission sought to share information with : Facility Medical Sales Representative, Family Supports    Share Information with NAME: Roxanna Symons  Permission granted to share info w AGENCY: [facility]  Permission granted to share info w Relationship: Wife  Permission granted to share info w Contact Information: [phone]  Emotional Assessment Appearance:: Appears stated age Attitude/Demeanor/Rapport: Engaged, Gracious Affect (typically observed): Accepting, Appropriate, Calm, Pleasant Orientation: : Oriented to Self Alcohol / Substance Use: Not Applicable Psych Involvement: No (comment)  Admission diagnosis:  Aortic atherosclerosis [I70.0] NSTEMI (non-ST  elevated myocardial infarction) [facility]) [I21.4] Patient Active Problem List   Diagnosis Date Noted   Atypical atrial flutter (HCC) 06/01/2024   Generalized weakness 05/31/2024   Failure to thrive in adult 05/31/2024   NSTEMI (non-ST elevated  myocardial infarction) (HCC) 05/31/2024   Discitis of cervical region 05/31/2024   Long term (current) use of antibiotics 05/31/2024   Calciphylaxis with multiple nonhealing ulcer, with fat layer exposed (HCC) 05/31/2024   Possible New onset atrial fibrillation (HCC) 05/31/2024   Leg wound, right, subsequent encounter 05/19/2024   HFrEF (heart failure with reduced ejection fraction) (HCC) 05/19/2024   Wound infection 05/18/2024   Wrist wound, open, simple, left, initial encounter 04/23/2024   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) 04/09/2024   Osteomyelitis of cervical spine (HCC) 04/08/2024   ESRD on peritoneal dialysis (HCC) 04/08/2024   HLD (hyperlipidemia) 04/08/2024   COPD (chronic obstructive pulmonary disease) (HCC) 04/08/2024   Left wrist pain 04/08/2024   Perianal abscess 04/08/2024   Gastroesophageal reflux disease without esophagitis 01/28/2024   Drug-induced constipation 01/28/2024   Hyperkalemia 03/24/2023   CAD with history of CABG x 4 03/24/2023   Hx of CABG 03/24/2023   Anemia 03/24/2023   Symptomatic bradycardia 03/24/2023   ESRD (end stage renal disease) on dialysis (HCC) 02/24/2023   Peritoneal dialysis catheter in place 02/24/2023   Aortic atherosclerosis 02/24/2023   Polyp of transverse colon 10/17/2022   Special screening for malignant neoplasms, colon 10/17/2022   Barrett's esophagus with dysplasia 09/24/2022   Hypokalemia    Wheeze    COVID-19 virus infection    Elevated d-dimer    Elevated troponin    Acute kidney injury superimposed on CKD    Hyponatremia    Chronic systolic heart failure (HCC)    Barrett's esophagus with low grade dysplasia    Diabetes mellitus with stage 4 chronic kidney disease GFR 15-29 (HCC) 06/01/2020   Serum albumin  decreased 06/01/2020   Vitamin D  deficiency 06/01/2020   Elevated hemoglobin A1c 06/01/2020   Elevated sed rate 06/01/2020   Elevated brain natriuretic peptide (BNP) level 06/01/2020    Insulin  dependent type 2 diabetes mellitus, uncontrolled 06/01/2020   Diabetes mellitus with complication, with long-term current use of insulin  (HCC) 06/01/2020   Chronic pain syndrome 05/24/2020   Pharmacologic therapy 05/24/2020   Disorder of skeletal system 05/24/2020   Problems influencing health status 05/24/2020   DDD (degenerative disc disease), cervical 05/24/2020   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, C5-6) (Left) 05/24/2020   Cervical Grade 1 Retrolisthesis of C4/C5 and C5/C6 05/24/2020   Chronic hand pain (Bilateral) 05/24/2020   Chronic leg and foot pain (Bilateral) 05/24/2020   Chronic lower extremity numbness from knee down (Bilateral) 05/24/2020   Chronic greater occipital neuralgia (Bilateral) 05/24/2020   Cervical facet syndrome (Left) 05/24/2020   Cervical disc disease with myelopathy 04/26/2020   Cervicalgia 04/09/2020   Syncope and collapse 04/09/2020   Esophageal dysphagia    Gastroesophageal reflux disease with esophagitis without hemorrhage    PAD (peripheral artery disease) 09/27/2019   Diabetes (HCC) 09/14/2019   Swelling of limb 09/14/2019   Lymphedema 09/14/2019   Shortness of breath 09/05/2019   Chest pain 07/16/2018   Essential hypertension 04/28/2018   History of pancreatitis 11/16/2017   Statin-induced myositis 11/16/2017   Type 2 diabetes mellitus with diabetic polyneuropathy, with long-term current use of insulin  (HCC) 11/16/2017   Coronary atherosclerosis 11/15/2016   Septic olecranon bursitis 10/14/2011   Cellulitis 10/06/2011   Depression with anxiety 02/22/2011   Diabetic  peripheral neuropathy (HCC) 02/22/2011   Migraines 02/22/2011   Other hyperlipidemia 02/22/2011   Sleep apnea 11/22/2010   PCP:  Liana Fish, NP Pharmacy:   [facility] [phone] - [place], [place] - [address] AT [facility] OF [name] [place] [address] Phone: [phone] Fax: [phone]     Social Drivers of Health  (SDOH) Social History: SDOH Screenings   Food Insecurity: No Food Insecurity (05/18/2024)  Housing: Low Risk (05/18/2024)  Transportation Needs: No Transportation Needs (05/18/2024)  Utilities: Not At Risk (05/18/2024)  Depression (PHQ2-9): Low Risk (01/22/2023)  Financial Resource Strain: Low Risk  (10/30/2023)   [facility] from [facility]  Physical Activity: Unknown (09/09/2021)   Received from [facility]  Social Connections: Unknown (05/18/2024)  Stress: No Stress Concern Present (02/23/2023)  Tobacco Use: High Risk (05/31/2024)  Health Literacy: Adequate Health Literacy (02/23/2023)   SDOH Interventions:     Readmission Risk Interventions    06/02/2024   10:24 AM  Readmission Risk Prevention Plan  Transportation Screening Complete  PCP or Specialist Appt within 3-5 Days Complete  HRI or Home Care Consult Complete  Social Work Consult for Recovery Care Planning/Counseling Complete  Palliative Care Screening Complete  Medication Review Oceanographer) Complete

## 2024-06-02 NOTE — Plan of Care (Signed)
  Problem: Clinical Measurements: Goal: Respiratory complications will improve Outcome: Progressing   Problem: Clinical Measurements: Goal: Cardiovascular complication will be avoided Outcome: Progressing   Problem: Nutrition: Goal: Adequate nutrition will be maintained Outcome: Progressing   Problem: Pain Managment: Goal: General experience of comfort will improve and/or be controlled Outcome: Progressing   Problem: Safety: Goal: Ability to remain free from injury will improve Outcome: Progressing

## 2024-06-02 NOTE — Assessment & Plan Note (Addendum)
 CAD with history of four-vessel CABG Patient with episode of shortness of breath probable chest discomfort, nonspecific EKG, troponin peaked at 689, can be due to demand ischemia.  Not a candidate for cardiac cath. Received aspirin  in the ED Completed 48 hours of heparin  Continue aspirin , carvedilol , nitroglycerin  sublingual as needed chest pain, Zetia  and Repatha  Cardiology is on board,  Echocardiogram with for the worsening of EF not to 20 to 25%, global hypokinesis. -Family now leaning to make him comfort care

## 2024-06-02 NOTE — Assessment & Plan Note (Addendum)
 On current long-term antibiotics for discitis treatment  Continue ceftriaxone , daptomycin  and fluconazole  via PICC line - Completed a course of antibiotic now

## 2024-06-02 NOTE — Consult Note (Signed)
 Pharmacy Consult Note - Anticoagulation  Pharmacy Consult for heparin  infusion Indication: chest pain/ACS Allergies[1]  PATIENT MEASUREMENTS: Height: 6' 1 (185.4 cm) Weight: 68.9 kg (151 lb 14 oz) IBW/kg (Calculated) : 79.9 HEPARIN  DW (KG): 68.9  VITAL SIGNS: Temp: 97.6 F (36.4 C) (01/07 2051) BP: 115/66 (01/07 2051) Pulse Rate: 100 (01/07 2051)  Recent Labs    05/31/24 1900 05/31/24 1956 05/31/24 2003 06/01/24 0322 06/02/24 0412 06/02/24 1148 06/02/24 1954  HGB  --   --   --    < > 12.5*  --   --   HCT  --   --   --    < > 37.5*  --   --   PLT  --   --   --    < > 224  --   --   APTT  --  32  --   --   --   --   --   LABPROT  --   --  15.4*  --   --   --   --   INR  --   --  1.2  --   --   --   --   HEPARINUNFRC  --   --   --    < >  --    < > 0.27*  CREATININE 6.60*  --   --   --   --   --   --   CKTOTAL  --   --   --   --  286  --   --    < > = values in this interval not displayed.    Estimated Creatinine Clearance: 10.9 mL/min (A) (by C-G formula based on SCr of 6.6 mg/dL (H)).    ASSESSMENT: 64 y.o. 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 Troponin elevated at 639 on admission. EKG on admission showing aflutter with AV block and prolonged QT. Patient is not on chronic anticoagulation per chart review.   Pharmacy has been consulted to initiate and manage heparin  intravenous infusion for ACS.  Goal(s) of therapy: Heparin  level 0.3 - 0.7 units/mL aPTT 66 - 102 seconds Monitor platelets by anticoagulation protocol: Yes   Date Time Results Comments 1/06 0322 HL <0.1 subtherapeutic 1/06 1653 HL 0.30 therapeutic x1 @ 1100 units/hr 1/07 0103 HL 0.15 Subtherapeutic 1/07 1148 HL 0.38 Therapeutic 1/07 1954 HL 0.27 Subtherapeutic @ 1300 units/hr  PLAN: HL subtherapeutic, Will give 1000 units bolus and increase heparin   infusion to 1450 units/hr Recheck HL in 8 hrs to confirm therapeutic rate CBC daily while on heparin   Annabella LOISE Banks, PharmD Clinical Pharmacist 06/02/2024 9:00 PM      [1]  Allergies Allergen Reactions   Iodinated Contrast Media Other (See Comments)    Kidney disease  Kidney disease  Kidney disease  Kidney disease   Other Other (See Comments)    Pain  With joint stiffness     Statins Other (See Comments)    Pain  With joint stiffness   Pain  With joint stiffness  Other Reaction(s): Joint Pain   Pregabalin Other (See Comments)    Generalized aches and pains Generalized aches and pains Generalized aches and pains Generalized aches and pains Generalized aches and pains   Sacubitril-Valsartan Other (See Comments)    Hyperkalemia  Hyperkalemia Hyperkalemia  Hyperkalemia   Atorvastatin  Other (See Comments)    Muscle aches Muscle aches   Pravastatin Other (See Comments)    Muscle aches Muscle aches   Rosuvastatin Other (See Comments)    Muscle Aches Other reaction(s): JOINT PAIN Other reaction(s): JOINT PAIN Muscle Aches

## 2024-06-02 NOTE — Progress Notes (Signed)
 During admission wife retreived blood sugar results from husbands diacom.  She then questioned me to give insulin .  I informed her that I have to follow our orders as per [facility] and our protocol.  I did not witness administration of insulin , med was given prior to our conversation while I was not in the room.  Educated wife and she insisted that she knew him and that is what works

## 2024-06-02 NOTE — Progress Notes (Signed)
 " Progress Note   Patient: [name] [name] FMW:[id number] DOB: 1960/07/21 DOA: 05/31/2024     1 DOS: the patient was seen and examined on 06/02/2024   Brief hospital course: Partly taken from H&P.  [name] [name] is a 64 y.o. 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  Patient developed sudden onset chest discomfort and shortness of breath.  With EMS patient was in A-fib.  On presentation mild tachycardia at 105, otherwise stable vital.  Labs notable for troponin of 639, proBNP > 35,000, leukocytosis at 11.9.  Hyponatremia with sodium at 129, potassium 3.4 and magnesium of 2.1 EKG with atrial flutter, nonspecific ST-T wave changes. CXR was negative for any acute abnormality.  Patient was given aspirin  and started on heparin  infusion.  Cardiology was consulted.  1/6: Vital stable, troponin 639>> 689, small improvement in leukocytosis to 11.2, A1c of 7.5 Echocardiogram with worsening EF now at 20 to 25%, grade 2 diastolic dysfunction, global hypokinesis, also found to have severely reduced right ventricular systolic function, probably severe aortic stenosis. Unable to urinate-did one-time In-N-Out catheter with removal of 1100 mL of urine, if he continue with unable to void-might need a Foley catheter. Cardiology is on board.  Per wife patient with declining overall health and functional status, refusing to eat and losing weight. Palliative care was consulted to discuss goals of care.  1/7: Patient more alert.  Wife  would like to avoid Xanax .  She wants placement as getting difficult for her to take care of him.  PT and OT ordered. He was given sodium Theophylline at for calciphylaxis by nephrology today.  Will complete the course of antibiotic for his prior infection.  Cardiology stopped his amlodipine  and switch to Imdur  and hydralazine  due to decrease in EF.  Assessment and Plan: * NSTEMI (non-ST elevated myocardial infarction) (HCC) CAD with history of four-vessel CABG Patient with episode of shortness of breath probable chest discomfort, nonspecific EKG, troponin peaked at 689, can be due to demand ischemia.  Not a candidate for cardiac cath. Received aspirin  in the ED Continue heparin  infusion-will complete 48 hours Continue aspirin , carvedilol , nitroglycerin  sublingual as needed chest pain, Zetia  and Repatha  Cardiology is on board, recommending continue heparin  infusion. Echocardiogram with for the worsening of EF not to 20 to 25%, global hypokinesis.  Possible New onset atrial fibrillation (HCC) EKG with concern of new onset atrial fibrillation/flutter, CHA2DS2-VASc at least 5 - Likely not a good candidate for DCCV but cardiology will decide -Continue with heparin  infusion for now, cardiology to decide about continuing anticoagulation based on his significant comorbidities and poor underlying functional status -Continue to monitor -Continue with Coreg   Calciphylaxis with multiple nonhealing ulcer, with fat layer exposed (HCC) Patient with mild multiple wounds, followed at wound care center Continue pain control Continue wound care  ESRD on peritoneal dialysis [facility]) Nephrology consulted for continuation of dialysis- -PD dialysis with the help of nephrology  Discitis of cervical region On current long-term antibiotics for discitis treatment  Continue ceftriaxone , daptomycin  and fluconazole  via PICC line - Patient just had 1 more day  of antibiotics left per ID pharmacy  Chronic systolic  heart failure (HCC) Clinically euvolemic, repeat echocardiogram with further decrease of EF to 20 to 25%. Continue GDMT  Essential hypertension Home amlodipine  was switched with Imdur  and hydralazine  due to further decrease in EF.  COPD (chronic obstructive pulmonary disease) (HCC) Not acutely exacerbated Continue home inhalers with DuoNebs as needed  Depression with anxiety Continue home alprazolam  and nortriptyline   Type 2 diabetes mellitus with diabetic polyneuropathy, with long-term current use of insulin  (HCC) Continue basal insulin  with sliding scale coverage  Chronic pain syndrome Continue as needed oxycodone    Subjective: Patient was more alert and interactive when seen today.  Denies any chest pain or shortness of breath.  Physical Exam: Vitals:   06/02/24 0451 06/02/24 0746 06/02/24 1205 06/02/24 1655  BP: 135/64 (!) 156/112 (!) 116/52 125/64  Pulse: 96 61 (!) 42 85  Resp: 18 18 16 18   Temp: 97.9 F (36.6 C) 98 F (36.7 C) 98 F (36.7 C) 98.4 F (36.9 C)  TempSrc:      SpO2: 100% 98% 98% (!) 81%  Weight: 68.9 kg     Height:       General.  Frail and malnourished gentleman, in no acute distress. Pulmonary.  Lungs clear bilaterally, normal respiratory effort. CV.  Irregularly irregular Abdomen.  Soft, nontender, nondistended, BS positive. CNS.  Alert and oriented .  No focal neurologic deficit. Extremities.  No edema, pulses intact and symmetrical. Psychiatry.  Judgment and insight appears normal.    Data Reviewed: Prior data reviewed  Family Communication: Discussed with wife at bedside  Disposition: Status is: Inpatient Remains inpatient appropriate because: Severity of illness  Planned Discharge Destination: To be determined  DVT prophylaxis.  Heparin  infusion Time spent: 50 minutes  This record has been created using Conservation officer, historic buildings. Errors have been sought and corrected,but may not always be located. Such creation errors do  not reflect on the standard of care.   Author: Amaryllis Dare, MD 06/02/2024 5:35 PM  For on call review [email or web address].  "

## 2024-06-02 NOTE — Progress Notes (Signed)
 " [facility]  ROUNDING NOTE   Subjective:   [name] [name]  is a 64 year old male with past medical conditions including hypertension, poorly controlled diabetes with neuropathy, GERD, COPD, CAD, and end-stage renal disease on peritoneal dialysis. Presents to the ED for shortness of breath and has been admitted for Aortic atherosclerosis [I70.0] NSTEMI (non-ST elevated myocardial infarction) [facility]) [I21.4]  Patient is known to our practice and is supervised at the [facility] clinic by Dr Dennise.   Patient seen sitting up in bed More alert, disoriented to place Breakfast tray untouched at bedside Currently receiving PD treatment per cycler    Objective:  Vital signs in last 24 hours:  Temp:  [97.9 F (36.6 C)-98.2 F (36.8 C)] 98 F (36.7 C) (01/07 1205) Pulse Rate:  [42-96] 42 (01/07 1205) Resp:  [11-18] 16 (01/07 1205) BP: (109-156)/(52-112) 116/52 (01/07 1205) SpO2:  [85 %-100 %] 98 % (01/07 1205) Weight:  [68.9 kg] 68.9 kg (01/07 0451)  Weight change: -0.057 kg Filed Weights   05/31/24 1812 06/02/24 0451  Weight: 68.9 kg 68.9 kg    Intake/Output: I/O last 3 completed shifts: In: 10 [I.V.:10] Out: -    Intake/Output this shift:  No intake/output data recorded.  Physical Exam: General: NAD  Head: Normocephalic, atraumatic.   Eyes: Anicteric  Lungs:  Clear to auscultation, room air  Heart: Regular rate and rhythm  Abdomen:  Soft, nontender. PD catheter  Extremities:  No peripheral edema.  Neurologic: Alert and oriented to self  Skin: Warm,dry, no rash, RLE wound  [facility]: [facility]    Basic Metabolic Panel: Recent Labs  Lab 05/31/24 1900  NA 129*  K 3.7  CL 87*  CO2 23  GLUCOSE 178*  BUN 53*  CREATININE 6.60*  CALCIUM  9.0  MG 2.1    Liver Function Tests: Recent Labs  Lab 06/02/24 0412  AST 29  ALT 15  ALKPHOS 124  BILITOT 0.3  PROT 6.5  ALBUMIN  3.1*    No results for input(s): LIPASE, AMYLASE in the last 168 hours. No  results for input(s): AMMONIA in the last 168 hours.  CBC: Recent Labs  Lab 05/31/24 1816 06/01/24 0322 06/02/24 0412  WBC 11.9* 11.2* 13.0*  NEUTROABS  --   --  10.6*  HGB 13.1 11.6* 12.5*  HCT 37.8* 33.7* 37.5*  MCV 92.0 92.8 94.2  PLT 246 225 224    Cardiac Enzymes: Recent Labs  Lab 06/02/24 0412  CKTOTAL 286     BNP: Invalid input(s): POCBNP  CBG: Recent Labs  Lab 06/01/24 2319 06/02/24 0027 06/02/24 0446 06/02/24 0748 06/02/24 1157  GLUCAP 145* 100* 87 123* 216*    Microbiology: Results for orders placed or performed during the hospital encounter of 05/18/24  Blood culture (single)     Status: None   Collection Time: 05/17/24  8:06 PM   Specimen: BLOOD  Result Value Ref Range Status   Specimen Description BLOOD RIGHT ANTECUBITAL  Final   Special Requests   Final    BOTTLES DRAWN AEROBIC AND ANAEROBIC Blood Culture adequate volume   Culture   Final    NO GROWTH 5 DAYS Performed at [facility], [address] [place], [place] [address]    Report Status 05/23/2024 FINAL  Final  Blood culture (routine x 2)     Status: None   Collection Time: 05/18/24  1:42 AM   Specimen: BLOOD  Result Value Ref Range Status   Specimen Description BLOOD BLOOD LEFT ARM  Final  Special Requests   Final    BOTTLES DRAWN AEROBIC AND ANAEROBIC Blood Culture adequate volume   Culture   Final    NO GROWTH 5 DAYS Performed at [facility], [address] [place], [place] [address]    Report Status 05/23/2024 FINAL  Final  Blood culture (routine x 2)     Status: None   Collection Time: 05/18/24  3:08 PM   Specimen: Right Antecubital; Blood  Result Value Ref Range Status   Specimen Description RIGHT ANTECUBITAL  Final   Special Requests   Final    BOTTLES DRAWN AEROBIC AND ANAEROBIC Blood Culture results may not be optimal due to an inadequate volume of blood received in culture bottles   Culture   Final    NO GROWTH 5 DAYS Performed at  [facility], [address], [place], [place] [address]    Report Status 05/23/2024 FINAL  Final    Coagulation Studies: Recent Labs    05/31/24 2003  LABPROT 15.4*  INR 1.2    Urinalysis: No results for input(s): COLORURINE, LABSPEC, PHURINE, GLUCOSEU, HGBUR, BILIRUBINUR, KETONESUR, PROTEINUR, UROBILINOGEN, NITRITE, LEUKOCYTESUR in the last 72 hours.  Invalid input(s): APPERANCEUR    Imaging: ECHOCARDIOGRAM COMPLETE Result Date: 06/01/2024    ECHOCARDIOGRAM REPORT   Patient Name:   [name] [name] Date of Exam: 06/01/2024 Medical Rec #:  [id number]       Height:       73.0 in Accession #:    [id number]      Weight:       152.0 lb Date of Birth:  August 21, 1960      BSA:          1.915 m Patient Age:    64 years        BP:           137/78 mmHg Patient Gender: M               HR:           55 bpm. Exam Location:  [facility] Procedure: 2D Echo, Cardiac Doppler and Color Doppler (Both Spectral and Color            Flow Doppler were utilized during procedure). Indications:     Dyspnea R06.00  History:         Patient has prior history of Echocardiogram examinations, most                  recent 03/25/2023. Signs/Symptoms:Dyspnea.  Sonographer:     Rosina Dunk Referring Phys:  [id number] DELAYNE LULLA SOLIAN Diagnosing Phys: Lonni Hanson MD IMPRESSIONS  1. Left ventricular ejection fraction, by estimation, is 20 to 25%. The left ventricle has severely decreased function. The left ventricle demonstrates global hypokinesis. The left ventricular internal cavity size was mildly dilated. Left ventricular diastolic parameters are consistent with Grade II diastolic dysfunction (pseudonormalization). Elevated left atrial pressure.  2. Right ventricular systolic function is severely reduced. The right ventricular size is normal.  3. Left atrial size was mildly dilated.  4. The mitral valve is degenerative. Mild mitral valve regurgitation. No evidence of mitral stenosis. Moderate to  severe mitral annular calcification.  5. Tricuspid valve regurgitation is mild to moderate.  6. The aortic valve is tricuspid. There is moderate calcification of the aortic valve. There is severe thickening of the aortic valve. Aortic valve regurgitation is not visualized. Probably severe low-flow/low-gradient aortic valve stenosis (mean gradient 14 mmHg, AVA 0.91 cm^2, DI 0.24).  7.  The inferior vena cava is dilated in size with <50% respiratory variability, suggesting right atrial pressure of 15 mmHg.  8. Cannot exclude a small PFO. FINDINGS  Left Ventricle: Left ventricular ejection fraction, by estimation, is 20 to 25%. The left ventricle has severely decreased function. The left ventricle demonstrates global hypokinesis. The left ventricular internal cavity size was mildly dilated. There is no left ventricular hypertrophy. Left ventricular diastolic parameters are consistent with Grade II diastolic dysfunction (pseudonormalization). Elevated left atrial pressure. Right Ventricle: The right ventricular size is normal. No increase in right ventricular wall thickness. Right ventricular systolic function is severely reduced. Left Atrium: Left atrial size was mildly dilated. Right Atrium: Right atrial size was normal in size. Pericardium: There is no evidence of pericardial effusion. Mitral Valve: The mitral valve is degenerative in appearance. There is mild thickening of the mitral valve leaflet(s). Moderate to severe mitral annular calcification. Mild mitral valve regurgitation. No evidence of mitral valve stenosis. MV peak gradient, 6.5 mmHg. The mean mitral valve gradient is 4.0 mmHg. Tricuspid Valve: The tricuspid valve is grossly normal. Tricuspid valve regurgitation is mild to moderate. Aortic Valve: The aortic valve is tricuspid. There is moderate calcification of the aortic valve. There is severe thickening of the aortic valve. Aortic valve regurgitation is not visualized. Severe aortic stenosis is present.  Aortic valve mean gradient measures 14.0 mmHg. Aortic valve peak gradient measures 27.5 mmHg. Aortic valve area, by VTI measures 0.91 cm. Pulmonic Valve: The pulmonic valve was normal in structure. Pulmonic valve regurgitation is not visualized. No evidence of pulmonic stenosis. Aorta: The aortic root and ascending aorta are structurally normal, with no evidence of dilitation. Pulmonary Artery: The pulmonary artery is of normal size. Venous: The inferior vena cava is dilated in size with less than 50% respiratory variability, suggesting right atrial pressure of 15 mmHg. IAS/Shunts: Cannot exclude a small PFO.  LEFT VENTRICLE PLAX 2D LVIDd:         5.70 cm      Diastology LVIDs:         4.80 cm      LV e' medial:    5.24 cm/s LV PW:         1.00 cm      LV E/e' medial:  23.3 LV IVS:        1.00 cm      LV e' lateral:   5.63 cm/s LVOT diam:     2.20 cm      LV E/e' lateral: 21.7 LV SV:         50 LV SV Index:   26 LVOT Area:     3.80 cm  LV Volumes (MOD) LV vol d, MOD A2C: 168.0 ml LV vol d, MOD A4C: 155.0 ml LV vol s, MOD A2C: 153.0 ml LV vol s, MOD A4C: 130.0 ml LV SV MOD A2C:     15.0 ml LV SV MOD A4C:     155.0 ml LV SV MOD BP:      19.6 ml RIGHT VENTRICLE          IVC RV Basal diam:  4.00 cm  IVC diam: 2.30 cm RV Mid diam:    3.30 cm TAPSE (M-mode): 0.5 cm LEFT ATRIUM             Index        RIGHT ATRIUM           Index LA diam:        3.90 cm 2.04  cm/m   RA Area:     15.60 cm LA Vol (A2C):   79.5 ml 41.52 ml/m  RA Volume:   37.50 ml  19.58 ml/m LA Vol (A4C):   71.4 ml 37.29 ml/m LA Biplane Vol: 76.8 ml 40.11 ml/m  AORTIC VALVE                     PULMONIC VALVE AV Area (Vmax):    0.90 cm      PV Vmax:        0.80 m/s AV Area (Vmean):   0.86 cm      PV Vmean:       58.300 cm/s AV Area (VTI):     0.91 cm      PV VTI:         0.143 m AV Vmax:           262.00 cm/s   PV Peak grad:   2.6 mmHg AV Vmean:          173.000 cm/s  PV Mean grad:   1.0 mmHg AV VTI:            0.553 m       RVOT Peak grad: 1 mmHg  AV Peak Grad:      27.5 mmHg AV Mean Grad:      14.0 mmHg LVOT Vmax:         61.80 cm/s LVOT Vmean:        39.300 cm/s LVOT VTI:          0.132 m LVOT/AV VTI ratio: 0.24  AORTA Ao Root diam: 3.00 cm Ao Asc diam:  3.00 cm MITRAL VALVE MV Area (PHT): 4.60 cm     SHUNTS MV Area VTI:   1.39 cm     Systemic VTI:  0.13 m MV Peak grad:  6.5 mmHg     Systemic Diam: 2.20 cm MV Mean grad:  4.0 mmHg     Pulmonic VTI:  0.087 m MV Vmax:       1.27 m/s MV Vmean:      91.4 cm/s MV Decel Time: 165 msec MV E velocity: 122.00 cm/s MV A velocity: 71.30 cm/s MV E/A ratio:  1.71 Lonni Hanson MD Electronically signed by Lonni Hanson MD Signature Date/Time: 06/01/2024/2:25:03 PM    Final    DG Chest 2 View Result Date: 05/31/2024 EXAM: 2 VIEW(S) XRAY OF THE CHEST 05/31/2024 06:28:00 PM COMPARISON: 04/08/2024 CLINICAL HISTORY: sob FINDINGS: LINES, TUBES AND DEVICES: Left PICC line in place with the tip in the SVC. LUNGS AND PLEURA: No focal pulmonary opacity. No pleural effusion. No pneumothorax. HEART AND MEDIASTINUM: Prior CABG. Aortic atherosclerosis. No acute abnormality of the cardiac and mediastinal silhouettes. BONES AND SOFT TISSUES: No acute osseous abnormality. IMPRESSION: 1. No active cardiopulmonary disease. Electronically signed by: Kevin Dover MD 05/31/2024 07:15 PM EST RP Workstation: [id number]     Medications:    dialysis solution 1.5% low-MG/low-CA     dialysis solution 2.5% low-MG/low-CA     heparin  1,300 Units/hr (06/02/24 0316)   sodium thiosulfate  25 g in sodium chloride  0.9 % 200 mL Infusion for Calciphylaxis      amLODipine   10 mg Oral Daily   aspirin  EC  81 mg Oral Daily   carvedilol   3.125 mg Oral BID WC   Chlorhexidine  Gluconate Cloth  6 each Topical Daily   ezetimibe   10 mg Oral Daily   feeding supplement (NEPRO CARB STEADY)  237 mL Oral TID  BM   gentamicin  cream  1 Application Topical Daily   hydrALAZINE   100 mg Oral BID   insulin  aspart  0-6 Units Subcutaneous Q4H   multivitamin  1  tablet Oral QHS   sodium chloride  flush  10-40 mL Intracatheter Q12H   thiamine   100 mg Oral Daily   torsemide   20 mg Oral BID   acetaminophen , albuterol , ALPRAZolam , nitroGLYCERIN , ondansetron  (ZOFRAN ) IV, oxyCODONE   Assessment/ Plan:  [name] [name] is a 64 y.o.  male with past medical conditions including hypertension, poorly controlled diabetes with neuropathy, GERD, COPD, CAD, and end-stage renal disease on peritoneal dialysis. Presents to the ED for evaluation of RLE wound.  End stage renal disease on peritoneal dialysis.  Patient receiving scheduled peritoneal dialysis today via cycler.  Patient will receive 2 2 L dwells, third dwell will last a minimum of 12 hours with ecodextran.  Will continue to use daily treatments while patient is admitted.  2. Wound infection, calciphylaxis.  Patient recently prescribed sodium thiosulfate  for treatments.  Will continue this treatment via IV during this admission.  Appreciate wound care recommendations.  3.  NSTEMI, elevated troponins.  Cardiology consulted.  Echo completed on 1/6 shows EF 20 to 25% with a grade 2 diastolic dysfunction, right ventricular systolic function severely reduced.  4. Anemia of chronic kidney disease Lab Results  Component Value Date   HGB 12.5 (L) 06/02/2024    Hgb stable.  5. Hypertension with chronic kidney disease. Prescribed amlodipine , carvedilol , clonidine , isosorbide  and torsemide .   Blood pressure remains acceptable, 116/52.    LOS: 1 Vivi Piccirilli 1/7/20261:10 PM   "

## 2024-06-02 NOTE — Progress Notes (Signed)
 "  Rounding Note   Patient Name: [name] [name] Date of Encounter: 06/02/2024  [facility] Cardiologist: Evalene Lunger, MD   Subjective Wife at the bedside He is more alert today She attributes encephalopathy yesterday to Xanax , pain medication, cleared by peritoneal dialysis overnight - She reports he has not been at his baseline since November with confusion, gait instability, falls, weakness - She reports she is unable to take care of him at home, given high level of care, unable to check sugar levels and regulate his medications - She indicates that he does not want hospice as he does not want to stop dialysis   Scheduled Meds:  amLODipine   10 mg Oral Daily   aspirin  EC  81 mg Oral Daily   carvedilol   3.125 mg Oral BID WC   Chlorhexidine  Gluconate Cloth  6 each Topical Daily   ezetimibe   10 mg Oral Daily   feeding supplement (NEPRO CARB STEADY)  237 mL Oral TID BM   gentamicin  cream  1 Application Topical Daily   hydrALAZINE   100 mg Oral BID   insulin  aspart  0-6 Units Subcutaneous Q4H   multivitamin  1 tablet Oral QHS   sodium chloride  flush  10-40 mL Intracatheter Q12H   thiamine   100 mg Oral Daily   torsemide   20 mg Oral BID   Continuous Infusions:  dialysis solution 1.5% low-MG/low-CA     dialysis solution 2.5% low-MG/low-CA     heparin  1,300 Units/hr (06/02/24 0316)   sodium thiosulfate  25 g in sodium chloride  0.9 % 200 mL Infusion for Calciphylaxis 25 g (06/02/24 1357)   PRN Meds: acetaminophen , albuterol , ALPRAZolam , nitroGLYCERIN , ondansetron  (ZOFRAN ) IV, oxyCODONE    Vital Signs  Vitals:   06/02/24 0029 06/02/24 0451 06/02/24 0746 06/02/24 1205  BP: 122/78 135/64 (!) 156/112 (!) 116/52  Pulse: 66 96 61 (!) 42  Resp: 18 18 18 16   Temp: 98.1 F (36.7 C) 97.9 F (36.6 C) 98 F (36.7 C) 98 F (36.7 C)  TempSrc:      SpO2: 98% 100% 98% 98%  Weight:  68.9 kg    Height:        Intake/Output Summary (Last 24 hours) at 06/02/2024 1451 Last data  filed at 06/02/2024 0245 Gross per 24 hour  Intake 10 ml  Output --  Net 10 ml      06/02/2024    4:51 AM 05/31/2024    6:12 PM 05/31/2024    8:54 AM  Last 3 Weights  Weight (lbs) 151 lb 14 oz 152 lb 152 lb  Weight (kg) 68.89 kg 68.947 kg 68.947 kg      Telemetry Atrial flutter personally Reviewed  ECG   - Personally Reviewed  Physical Exam  GEN: No acute distress.  Alert, mild confusion Neck: No JVD Cardiac: Irregular, no murmurs, rubs, or gallops.  Respiratory: Clear to auscultation bilaterally. GI: Soft, nontender, non-distended  MS: No edema; No deformity Skin: Wounds on arms, legs. Neuro: Full exam not performed Psych: Mild confusion  Labs High Sensitivity Troponin:  No results for input(s): TROPONINIHS in the last 720 hours.  Recent Labs  Lab 05/31/24 1900 05/31/24 1956 06/01/24 0934 06/01/24 1850  TRNPT 639* 689* 674* 643*       Chemistry Recent Labs  Lab 05/31/24 1900 06/02/24 0412  NA 129*  --   K 3.7  --   CL 87*  --   CO2 23  --   GLUCOSE 178*  --   BUN 53*  --  CREATININE 6.60*  --   CALCIUM  9.0  --   MG 2.1  --   PROT  --  6.5  ALBUMIN   --  3.1*  AST  --  29  ALT  --  15  ALKPHOS  --  124  BILITOT  --  0.3  GFRNONAA 9*  --   ANIONGAP 20*  --     Lipids No results for input(s): CHOL, TRIG, HDL, LABVLDL, LDLCALC, CHOLHDL in the last 168 hours.  Hematology Recent Labs  Lab 05/31/24 1816 06/01/24 0322 06/02/24 0412  WBC 11.9* 11.2* 13.0*  RBC 4.11* 3.63* 3.98*  HGB 13.1 11.6* 12.5*  HCT 37.8* 33.7* 37.5*  MCV 92.0 92.8 94.2  MCH 31.9 32.0 31.4  MCHC 34.7 34.4 33.3  RDW 15.1 14.9 15.2  PLT 246 225 224   Thyroid   Recent Labs  Lab 06/01/24 0934  TSH 1.130    BNP Recent Labs  Lab 05/31/24 1900  PROBNP >35,000.0*    DDimer No results for input(s): DDIMER in the last 168 hours.   Radiology  ECHOCARDIOGRAM COMPLETE Result Date: 06/01/2024    ECHOCARDIOGRAM REPORT   Patient Name:   YOLANDA DOCKENDORF Date of  Exam: 06/01/2024 Medical Rec #:  [id number]       Height:       73.0 in Accession #:    [id number]      Weight:       152.0 lb Date of Birth:  26-Oct-1960      BSA:          1.915 m Patient Age:    64 years        BP:           137/78 mmHg Patient Gender: M               HR:           55 bpm. Exam Location:  [facility] Procedure: 2D Echo, Cardiac Doppler and Color Doppler (Both Spectral and Color            Flow Doppler were utilized during procedure). Indications:     Dyspnea R06.00  History:         Patient has prior history of Echocardiogram examinations, most                  recent 03/25/2023. Signs/Symptoms:Dyspnea.  Sonographer:     Rosina Dunk Referring Phys:  [id number] DELAYNE LULLA SOLIAN Diagnosing Phys: Lonni Hanson MD IMPRESSIONS  1. Left ventricular ejection fraction, by estimation, is 20 to 25%. The left ventricle has severely decreased function. The left ventricle demonstrates global hypokinesis. The left ventricular internal cavity size was mildly dilated. Left ventricular diastolic parameters are consistent with Grade II diastolic dysfunction (pseudonormalization). Elevated left atrial pressure.  2. Right ventricular systolic function is severely reduced. The right ventricular size is normal.  3. Left atrial size was mildly dilated.  4. The mitral valve is degenerative. Mild mitral valve regurgitation. No evidence of mitral stenosis. Moderate to severe mitral annular calcification.  5. Tricuspid valve regurgitation is mild to moderate.  6. The aortic valve is tricuspid. There is moderate calcification of the aortic valve. There is severe thickening of the aortic valve. Aortic valve regurgitation is not visualized. Probably severe low-flow/low-gradient aortic valve stenosis (mean gradient 14 mmHg, AVA 0.91 cm^2, DI 0.24).  7. The inferior vena cava is dilated in size with <50% respiratory variability, suggesting right atrial pressure of 15 mmHg.  8. Cannot exclude a  small PFO. FINDINGS  Left Ventricle:  Left ventricular ejection fraction, by estimation, is 20 to 25%. The left ventricle has severely decreased function. The left ventricle demonstrates global hypokinesis. The left ventricular internal cavity size was mildly dilated. There is no left ventricular hypertrophy. Left ventricular diastolic parameters are consistent with Grade II diastolic dysfunction (pseudonormalization). Elevated left atrial pressure. Right Ventricle: The right ventricular size is normal. No increase in right ventricular wall thickness. Right ventricular systolic function is severely reduced. Left Atrium: Left atrial size was mildly dilated. Right Atrium: Right atrial size was normal in size. Pericardium: There is no evidence of pericardial effusion. Mitral Valve: The mitral valve is degenerative in appearance. There is mild thickening of the mitral valve leaflet(s). Moderate to severe mitral annular calcification. Mild mitral valve regurgitation. No evidence of mitral valve stenosis. MV peak gradient, 6.5 mmHg. The mean mitral valve gradient is 4.0 mmHg. Tricuspid Valve: The tricuspid valve is grossly normal. Tricuspid valve regurgitation is mild to moderate. Aortic Valve: The aortic valve is tricuspid. There is moderate calcification of the aortic valve. There is severe thickening of the aortic valve. Aortic valve regurgitation is not visualized. Severe aortic stenosis is present. Aortic valve mean gradient measures 14.0 mmHg. Aortic valve peak gradient measures 27.5 mmHg. Aortic valve area, by VTI measures 0.91 cm. Pulmonic Valve: The pulmonic valve was normal in structure. Pulmonic valve regurgitation is not visualized. No evidence of pulmonic stenosis. Aorta: The aortic root and ascending aorta are structurally normal, with no evidence of dilitation. Pulmonary Artery: The pulmonary artery is of normal size. Venous: The inferior vena cava is dilated in size with less than 50% respiratory variability, suggesting right atrial pressure  of 15 mmHg. IAS/Shunts: Cannot exclude a small PFO.  LEFT VENTRICLE PLAX 2D LVIDd:         5.70 cm      Diastology LVIDs:         4.80 cm      LV e' medial:    5.24 cm/s LV PW:         1.00 cm      LV E/e' medial:  23.3 LV IVS:        1.00 cm      LV e' lateral:   5.63 cm/s LVOT diam:     2.20 cm      LV E/e' lateral: 21.7 LV SV:         50 LV SV Index:   26 LVOT Area:     3.80 cm  LV Volumes (MOD) LV vol d, MOD A2C: 168.0 ml LV vol d, MOD A4C: 155.0 ml LV vol s, MOD A2C: 153.0 ml LV vol s, MOD A4C: 130.0 ml LV SV MOD A2C:     15.0 ml LV SV MOD A4C:     155.0 ml LV SV MOD BP:      19.6 ml RIGHT VENTRICLE          IVC RV Basal diam:  4.00 cm  IVC diam: 2.30 cm RV Mid diam:    3.30 cm TAPSE (M-mode): 0.5 cm LEFT ATRIUM             Index        RIGHT ATRIUM           Index LA diam:        3.90 cm 2.04 cm/m   RA Area:     15.60 cm LA Vol (A2C):   79.5 ml 41.52 ml/m  RA Volume:  37.50 ml  19.58 ml/m LA Vol (A4C):   71.4 ml 37.29 ml/m LA Biplane Vol: 76.8 ml 40.11 ml/m  AORTIC VALVE                     PULMONIC VALVE AV Area (Vmax):    0.90 cm      PV Vmax:        0.80 m/s AV Area (Vmean):   0.86 cm      PV Vmean:       58.300 cm/s AV Area (VTI):     0.91 cm      PV VTI:         0.143 m AV Vmax:           262.00 cm/s   PV Peak grad:   2.6 mmHg AV Vmean:          173.000 cm/s  PV Mean grad:   1.0 mmHg AV VTI:            0.553 m       RVOT Peak grad: 1 mmHg AV Peak Grad:      27.5 mmHg AV Mean Grad:      14.0 mmHg LVOT Vmax:         61.80 cm/s LVOT Vmean:        39.300 cm/s LVOT VTI:          0.132 m LVOT/AV VTI ratio: 0.24  AORTA Ao Root diam: 3.00 cm Ao Asc diam:  3.00 cm MITRAL VALVE MV Area (PHT): 4.60 cm     SHUNTS MV Area VTI:   1.39 cm     Systemic VTI:  0.13 m MV Peak grad:  6.5 mmHg     Systemic Diam: 2.20 cm MV Mean grad:  4.0 mmHg     Pulmonic VTI:  0.087 m MV Vmax:       1.27 m/s MV Vmean:      91.4 cm/s MV Decel Time: 165 msec MV E velocity: 122.00 cm/s MV A velocity: 71.30 cm/s MV E/A ratio:  1.71  Lonni Hanson MD Electronically signed by Lonni Hanson MD Signature Date/Time: 06/01/2024/2:25:03 PM    Final    DG Chest 2 View Result Date: 05/31/2024 EXAM: 2 VIEW(S) XRAY OF THE CHEST 05/31/2024 06:28:00 PM COMPARISON: 04/08/2024 CLINICAL HISTORY: sob FINDINGS: LINES, TUBES AND DEVICES: Left PICC line in place with the tip in the SVC. LUNGS AND PLEURA: No focal pulmonary opacity. No pleural effusion. No pneumothorax. HEART AND MEDIASTINUM: Prior CABG. Aortic atherosclerosis. No acute abnormality of the cardiac and mediastinal silhouettes. BONES AND SOFT TISSUES: No acute osseous abnormality. IMPRESSION: 1. No active cardiopulmonary disease. 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   Assessment & Plan   1.  New onset atrial flutter: - Grossly asymptomatic -Would continue rate control, carvedilol  3.125 twice daily -Given encephalopathy, not a good candidate for general anesthesia, TEE cardioversion High risk of recurrent arrhythmia in the setting of cardiomyopathy -Ejection fraction previously 35 to 40% in 2024, Now down to 20 to 25% - Heparin  drip for now,  unclear if he would be a DOAC candidate  - CHA2DS2-VASc at least 5 (CHF, HTN, age x 1, DM, vascular disease)   2.  CAD status post CABG with elevated high-sensitivity troponin:  troponin peaking at 689,down-trending -Not a good candidate for cardiac catheterization - Suspected supply/demand mismatch/demand ischemia in the setting of atrial flutter with underlying cervical  discitis and ESRD  - Continue ASA 81 mg, beta-blocker, Zetia  History of statin intolerance, on Repatha    3.  HFrEF: - Elevated proBNP in the setting of end-stage renal disease -No respiratory distress -On peritoneal dialysis, torsemide  Given low ejection fraction we will change amlodipine  Continue hydralazine  and restart Imdur  30 daily   4.  ESRD on PD with anemia of chronic disease: - Management per nephrology -Wife indicates that he did not want to stop hemodialysis for hospice   5.  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  HTN: Changes as above   7.  HLD with statin intolerance: - LDL 35 in 10/2023 - PTA ezetimibe  10 mg, outpatient Repatha   Wife indicates she would be interested in him participating in inpatient rehab at discharge   For questions or updates, please contact [facility] Please consult [email or web address] for contact info under     Signed, Amiliah Campisi, MD  06/02/2024, 2:51 PM    "

## 2024-06-02 NOTE — Consult Note (Signed)
 Pharmacy Consult Note - Anticoagulation  Pharmacy Consult for heparin  infusion Indication: chest pain/ACS Allergies[1]  PATIENT MEASUREMENTS: Height: 6' 1 (185.4 cm) Weight: 68.9 kg (152 lb) IBW/kg (Calculated) : 79.9 HEPARIN  DW (KG): 68.9  VITAL SIGNS: Temp: 98.1 F (36.7 C) (01/07 0029) BP: 122/78 (01/07 0029) Pulse Rate: 66 (01/07 0029)  Recent Labs    05/31/24 1900 05/31/24 1956 05/31/24 2003 06/01/24 0322 06/01/24 1653 06/02/24 0103  HGB  --   --   --  11.6*  --   --   HCT  --   --   --  33.7*  --   --   PLT  --   --   --  225  --   --   APTT  --  32  --   --   --   --   LABPROT  --   --  15.4*  --   --   --   INR  --   --  1.2  --   --   --   HEPARINUNFRC  --   --   --  <0.10*   < > 0.15*  CREATININE 6.60*  --   --   --   --   --    < > = values in this interval not displayed.    Estimated Creatinine Clearance: 10.9 mL/min (A) (by C-G formula based on SCr of 6.6 mg/dL (H)).  PAST MEDICAL HISTORY: Past Medical History:  Diagnosis Date   Anemia    Arthritis    CAD (coronary artery disease)    a. 12/2016 s/p CABG x 4 (LIMA->LAD, VG->RCA, VG->OM1, VG->D1); b. 02/2018 MV: EF 35%, small-med inferolaterlal infarct. No ischemia.   CKD (chronic kidney disease), stage IV [facility])    Peritoneal Dialysis   Colon polyps    COPD (chronic obstructive pulmonary disease) (HCC)    Dupre's syndrome    GERD (gastroesophageal reflux disease)    HFrEF (heart failure reduced ejection fraction) (HCC)    a.) 2018 EF 35%; b.) 02/2018 EF 50%; c). 02/2019 EF 40-45%; d.) 08/2019 Echo: EF 50-55%, no rwma, GrII DD; e.) 04/2020 Echo: EF 30-35%, mod-sev glob HK. Mild LVH. G2DD. Low-nl RV fxn. Mildly dil LA. Mild MR. Mild-mod Ao sclerosis w/o stenosis; f.) TTE 02/09/2021: EF 55%; septal HK, LAE; G2DD.   History of 2019 novel coronavirus disease (COVID-19) 02/08/2021   Hyperlipidemia    Hypertension    Ischemic cardiomyopathy    a.) 02/2018 MV: EF 35%; b.) 08/2019 Echo: EF 50-55%; c.) 04/2020  Echo: EF 30-35%; d.) TTE 02/09/2021: EF 50-55%.   Lymphedema    Migraine    Myocardial infarction (HCC)    Neuropathy    PAD (peripheral artery disease)    a. 04/2017 Aortoiliac duplex: R iliac dzs; b. 03/2020 LE Duplex: mild bilat atherosclerosis throughout. Patent vessels.   Pneumonia    Retinopathy    S/P CABG x 4    a.)  4v CABG: LIMA->LAD, SVG->RCA, SVG->OM1, SVG->D1   Sleep apnea    a.) does not require nocturnal PAP therapy   Statin intolerance    Stomach ulcer    T2DM (type 2 diabetes mellitus) (HCC)     Medications:  Medications Prior to Admission  Medication Sig Dispense Refill Last Dose/Taking   albuterol  (VENTOLIN  HFA) 108 (90 Base) MCG/ACT inhaler Inhale 1-2 puffs into the lungs every 6 (six) hours as needed for wheezing or shortness of breath. 1 each 3 05/31/2024   ALPRAZolam  (XANAX ) 0.5  MG tablet Take 1 tablet (0.5 mg total) by mouth 2 (two) times daily as needed for anxiety. 40 tablet 0 Past Week   amLODipine  (NORVASC ) 10 MG tablet Take 1 tablet (10 mg total) by mouth daily. 90 tablet 3 05/30/2024 Morning   aspirin  EC 81 MG tablet Take 81 mg by mouth daily. Swallow whole.   05/30/2024 Morning   bisacodyl  (DULCOLAX) 5 MG EC tablet Take 10 mg by mouth 2 (two) times daily as needed for moderate constipation.   Taking As Needed   carvedilol  (COREG ) 3.125 MG tablet Take 1 tablet (3.125 mg total) by mouth 2 (two) times daily with a meal. 180 tablet 3 05/30/2024 Evening   cefTRIAXone  (ROCEPHIN ) 2 g injection 2 g daily. HOME INFUSION 2 GM IVP Q24H   05/31/2024   cholecalciferol  (VITAMIN D3) 25 MCG (1000 UNIT) tablet Take 2,000 Units by mouth 2 (two) times daily.   05/30/2024 Evening   DAPTOmycin  (CUBICIN ) 500 MG injection Inject 500 mg into the vein every other day. HOME INFUSION 500 MG IVP EVERY OTHER DAY   05/31/2024   Evolocumab  (REPATHA  SURECLICK) 140 MG/ML SOAJ INJECT 1 ML UNDER THE SKIN EVERY 14 DAYS 6 mL 3 05/30/2024   ezetimibe  (ZETIA ) 10 MG tablet Take 1 tablet (10 mg total) by mouth  daily. 90 tablet 3 05/30/2024 Morning   fluconazole  (DIFLUCAN ) 200 MG tablet Take 1 tablet (200 mg total) by mouth daily. 30 tablet 0 05/31/2024 Morning   gentamicin  cream (GARAMYCIN ) 0.1 % Apply 1 Application topically 3 (three) times daily.   05/30/2024 Evening   HUMALOG  MIX 75/25 KWIKPEN (75-25) 100 UNIT/ML KwikPen Inject 30 Units into the skin every morning. 75-25 pen injectior preceding dialysis treatment.   05/31/2024 Morning   hydrALAZINE  (APRESOLINE ) 100 MG tablet Take 1 tablet (100 mg total) by mouth 3 (three) times daily. (Patient taking differently: Take 100 mg by mouth 2 (two) times daily.) 270 tablet 3 05/30/2024 Evening   insulin  lispro (HUMALOG ) 100 UNIT/ML KwikPen Inject 0-15 Units into the skin 3 (three) times daily.   05/31/2024 Morning   isosorbide  mononitrate (IMDUR ) 30 MG 24 hr tablet Take 1 tablet (30 mg total) by mouth 2 (two) times daily. 180 tablet 3 05/30/2024 Evening   nitroGLYCERIN  (NITROSTAT ) 0.4 MG SL tablet Place 1 tablet (0.4 mg total) under the tongue every 5 (five) minutes as needed for chest pain. 25 tablet 3 Unknown   nortriptyline  (PAMELOR ) 50 MG capsule Take 50 mg by mouth at bedtime.   05/30/2024 Bedtime   oxyCODONE  (OXY IR/ROXICODONE ) 5 MG immediate release tablet Take 1 tablet (5 mg total) by mouth 2 (two) times daily as needed for severe pain (pain score 7-10). 60 tablet 0 05/31/2024 at 12:00 PM   pantoprazole  (PROTONIX ) 40 MG tablet Take 1 tablet (40 mg total) by mouth 2 (two) times daily before a meal. 180 tablet 3 05/30/2024 Evening   torsemide  (DEMADEX ) 20 MG tablet Take 1 tablet (20 mg total) by mouth 2 (two) times daily. (Patient taking differently: Take 40-60 mg by mouth 2 (two) times daily. 60 mg every morning and 40 mg 12 hours later) 180 tablet 3 05/30/2024 Evening   TOUJEO  MAX SOLOSTAR 300 UNIT/ML Solostar Pen Inject 60 Units into the skin daily.   05/30/2024 at 10:00 PM   cloNIDine  (CATAPRES ) 0.1 MG tablet Take 1 tablet (0.1 mg total) by mouth 2 (two) times daily as needed  (pressure >175). (Patient not taking: Reported on 05/31/2024) 60 tablet 3 Not Taking   Insulin   Pen Needle (B-D ULTRAFINE III SHORT PEN) 31G X 8 MM MISC To use with pen basal and mealtime coverage insulin  pens 5 times daily. DX: E11.65 500 each 1    Scheduled:   amLODipine   10 mg Oral Daily   aspirin  EC  81 mg Oral Daily   carvedilol   3.125 mg Oral BID WC   Chlorhexidine  Gluconate Cloth  6 each Topical Daily   ezetimibe   10 mg Oral Daily   fluconazole   200 mg Oral Daily   gentamicin  cream  1 Application Topical Daily   hydrALAZINE   100 mg Oral BID   insulin  aspart  0-6 Units Subcutaneous Q4H   sodium chloride  flush  10-40 mL Intracatheter Q12H   torsemide   20 mg Oral BID   Infusions:   cefTRIAXone  (ROCEPHIN )  IV Stopped (06/01/24 1302)   dialysis solution 1.5% low-MG/low-CA     dialysis solution 2.5% low-MG/low-CA     heparin  1,100 Units/hr (06/01/24 2352)   PRN:  Anti-infectives (From admission, onward)    Start     Dose/Rate Route Frequency Ordered Stop   06/02/24 1400  DAPTOmycin  (CUBICIN ) injection 500 mg  Status:  Discontinued       Note to Pharmacy: HOME INFUSION 500 MG IVP EVERY OTHER DAY     500 mg Intravenous Every other day 06/01/24 0046 06/01/24 1632   06/01/24 1200  cefTRIAXone  (ROCEPHIN ) 2 g in sodium chloride  0.9 % 100 mL IVPB        2 g 200 mL/hr over 30 Minutes Intravenous Every 24 hours 06/01/24 0046 06/03/24 1159   06/01/24 1000  fluconazole  (DIFLUCAN ) tablet 200 mg        200 mg Oral Daily 05/31/24 2141 06/03/24 0959       ASSESSMENT: 64 y.o. 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 Troponin elevated at 639 on admission. EKG on admission showing aflutter with AV block and prolonged QT. Patient is not on chronic anticoagulation per chart review. Pharmacy has been consulted to initiate and manage heparin  intravenous  infusion.   Goal(s) of therapy: Heparin  level 0.3 - 0.7 units/mL aPTT 66 - 102 seconds Monitor platelets by anticoagulation protocol: Yes   01/06 0322 HL <0.1, subtherapeutic 01/06 1653 HL 0.30, therapeutic x1 @ 1100 units/hr 01/07 0103 HL 0.15, subtherapeutic  Hgb around historical baseline and plt stable.  PLAN: Bolus 2100 units x 1 Increase heparin  infusion to 1300 units/hr Recheck HL in 8 hrs after rate change CBC daily while on heparin   Rankin CANDIE Dills, PharmD, [facility] 06/02/2024 3:00 AM         [1]  Allergies Allergen Reactions   Iodinated Contrast Media Other (See Comments)    Kidney disease  Kidney disease  Kidney disease  Kidney disease   Other Other (See Comments)    Pain  With joint stiffness     Statins Other (See Comments)    Pain  With joint stiffness   Pain  With joint stiffness  Other Reaction(s): Joint Pain   Pregabalin Other (See Comments)    Generalized aches and pains Generalized aches and pains Generalized aches and pains Generalized aches and pains Generalized aches and pains   Sacubitril-Valsartan Other (See Comments)    Hyperkalemia  Hyperkalemia Hyperkalemia  Hyperkalemia   Atorvastatin Other (See Comments)    Muscle aches Muscle aches   Pravastatin Other (See Comments)  Muscle aches Muscle aches   Rosuvastatin Other (See Comments)    Muscle Aches Other reaction(s): JOINT PAIN Other reaction(s): JOINT PAIN Muscle Aches

## 2024-06-02 NOTE — Plan of Care (Signed)
  Problem: Education: Goal: Ability to describe self-care measures that may prevent or decrease complications (Diabetes Survival Skills Education) will improve Outcome: Progressing Goal: Individualized Educational Video(s) Outcome: Progressing   Problem: Coping: Goal: Ability to adjust to condition or change in health will improve Outcome: Progressing   Problem: Fluid Volume: Goal: Ability to maintain a balanced intake and output will improve Outcome: Progressing   Problem: Health Behavior/Discharge Planning: Goal: Ability to identify and utilize available resources and services will improve Outcome: Progressing Goal: Ability to manage health-related needs will improve Outcome: Progressing   Problem: Metabolic: Goal: Ability to maintain appropriate glucose levels will improve Outcome: Progressing   Problem: Nutritional: Goal: Maintenance of adequate nutrition will improve Outcome: Progressing Goal: Progress toward achieving an optimal weight will improve Outcome: Progressing   Problem: Skin Integrity: Goal: Risk for impaired skin integrity will decrease Outcome: Progressing   Problem: Tissue Perfusion: Goal: Adequacy of tissue perfusion will improve Outcome: Progressing   Problem: Education: Goal: Understanding of cardiac disease, CV risk reduction, and recovery process will improve Outcome: Progressing Goal: Individualized Educational Video(s) Outcome: Progressing   Problem: Activity: Goal: Ability to tolerate increased activity will improve Outcome: Progressing   Problem: Cardiac: Goal: Ability to achieve and maintain adequate cardiovascular perfusion will improve Outcome: Progressing   Problem: Health Behavior/Discharge Planning: Goal: Ability to safely manage health-related needs after discharge will improve Outcome: Progressing   Problem: Education: Goal: Knowledge of General Education information will improve Description: Including pain rating scale,  medication(s)/side effects and non-pharmacologic comfort measures Outcome: Progressing   Problem: Health Behavior/Discharge Planning: Goal: Ability to manage health-related needs will improve Outcome: Progressing   Problem: Clinical Measurements: Goal: Ability to maintain clinical measurements within normal limits will improve Outcome: Progressing Goal: Will remain free from infection Outcome: Progressing Goal: Diagnostic test results will improve Outcome: Progressing Goal: Respiratory complications will improve Outcome: Progressing Goal: Cardiovascular complication will be avoided Outcome: Progressing   Problem: Activity: Goal: Risk for activity intolerance will decrease Outcome: Progressing   Problem: Nutrition: Goal: Adequate nutrition will be maintained Outcome: Progressing   Problem: Coping: Goal: Level of anxiety will decrease Outcome: Progressing   Problem: Elimination: Goal: Will not experience complications related to bowel motility Outcome: Progressing Goal: Will not experience complications related to urinary retention Outcome: Progressing   Problem: Pain Managment: Goal: General experience of comfort will improve and/or be controlled Outcome: Progressing   Problem: Safety: Goal: Ability to remain free from injury will improve Outcome: Progressing   Problem: Skin Integrity: Goal: Risk for impaired skin integrity will decrease Outcome: Progressing

## 2024-06-02 NOTE — Assessment & Plan Note (Signed)
 Patient with mild multiple wounds, followed at wound care center Continue pain control Continue wound care

## 2024-06-02 NOTE — Telephone Encounter (Signed)
 Sent message to [name] [name] pt in hospital

## 2024-06-02 NOTE — Progress Notes (Signed)
"  °  Peritoneal Dialysis Treatment Initiation Note     Pre Treatment Weight: 69.1 Kg   Consent signed and in chart.  PD treatment initiated via aseptic technique.    Patient is awake and alert. No complaints of pain.    PD exit site clean, dry and intact.  Gentamicin  and new dressing applied.    Hand-off given to the patient's nurse.  Education provided to dept staff  regarding PD machine and how  to contact tech support if machine  alarms.    Ozell Jubilee RN [facility] "

## 2024-06-02 NOTE — Progress Notes (Signed)
"  °  Peritoneal Dialysis Treatment Disconnection Note     Post Treatment Weight: 73.1 Kg   Consent signed and in chart.  PD treatment disconnected via aseptic technique.    Patient is awake and alert. No complaints of pain.    PD exit site clean, dry and intact.    Hand-off given to the patient's nurse.    Ozell Jubilee RN [facility] "

## 2024-06-02 NOTE — Assessment & Plan Note (Signed)
 Home amlodipine  was switched with Imdur  and hydralazine  due to further decrease in EF.

## 2024-06-02 NOTE — Consult Note (Addendum)
 WOC Nurse Consult Note: followed at [facility] for multiple wounds; last seen 12/29 with use of Xeroform to the R leg  Reason for Consult: R leg and L hand wounds  Wound type: 1.  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 Full thickness L medial wrist, L thumb/webspace, Left 2nd digit, L distal 3rd digit, 4th and 5th digits with dry appearing necrotic brown tissue  Pressure Injury POA: NA not pressure  Measurement: see nursing flowsheet Wound bed: as above  Drainage (amount, consistency, odor) see nursing flowsheet  Periwound: Dressing procedure/placement/frequency:  Cleanse R lower leg with Vashe (intact skin and wounds), do not rinse.  Apply Xeroform gauze ([place] [phone]) to wound beds daily, cover with ABD pad and secure with Kerlix roll gauze beginning right above toes and ending right below knee.  Apply Ace bandage wrapped in same fashion as Kerlix for light compression.  Cleanse L wrist/hand/finger wounds with Vashe, do not rinse. Paint with Betadine 2 times daily and allow to air dry.  May cover with silicone foam or dry gauze and tape as desired depending on location.    POC discussed with bedside nurse.  WOC team will not follow.  Patient should continue follow-up at wound care center as outpatient.    Thank you,    Powell Bar MSN, RN-BC, [employer]

## 2024-06-02 NOTE — Consult Note (Signed)
 Pharmacy Consult Note - Anticoagulation  Pharmacy Consult for heparin  infusion Indication: chest pain/ACS Allergies[1]  PATIENT MEASUREMENTS: Height: 6' 1 (185.4 cm) Weight: 68.9 kg (151 lb 14 oz) IBW/kg (Calculated) : 79.9 HEPARIN  DW (KG): 68.9  VITAL SIGNS: Temp: 98 F (36.7 C) (01/07 1205) BP: 116/52 (01/07 1205) Pulse Rate: 42 (01/07 1205)  Recent Labs    05/31/24 1900 05/31/24 1956 05/31/24 2003 06/01/24 0322 06/02/24 0412 06/02/24 1148  HGB  --   --   --    < > 12.5*  --   HCT  --   --   --    < > 37.5*  --   PLT  --   --   --    < > 224  --   APTT  --  32  --   --   --   --   LABPROT  --   --  15.4*  --   --   --   INR  --   --  1.2  --   --   --   HEPARINUNFRC  --   --   --    < >  --  0.38  CREATININE 6.60*  --   --   --   --   --   CKTOTAL  --   --   --   --  286  --    < > = values in this interval not displayed.    Estimated Creatinine Clearance: 10.9 mL/min (A) (by C-G formula based on SCr of 6.6 mg/dL (H)).    ASSESSMENT: 64 y.o. 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 Troponin elevated at 639 on admission. EKG on admission showing aflutter with AV block and prolonged QT. Patient is not on chronic anticoagulation per chart review.   Pharmacy has been consulted to initiate and manage heparin  intravenous infusion for ACS.  Goal(s) of therapy: Heparin  level 0.3 - 0.7 units/mL aPTT 66 - 102 seconds Monitor platelets by anticoagulation protocol: Yes   Date Time Results Comments 1/06 0322 HL <0.1 subtherapeutic 1/06 1653 HL 0.30 therapeutic x1 @ 1100 units/hr 1/07 0103 HL 0.15 Subtherapeutic 1/07 1148 HL 0.38 Therapeutic  PLAN: HL therapeutic, continue heparin  infusion at 1300 units/hr Recheck HL in 8 hrs to confirm therapeutic rate CBC daily while on heparin   Shaquoia Miers Rodriguez-Guzman PharmD, BCPS 06/02/2024  2:47 PM          [1]  Allergies Allergen Reactions   Iodinated Contrast Media Other (See Comments)    Kidney disease  Kidney disease  Kidney disease  Kidney disease   Other Other (See Comments)    Pain  With joint stiffness     Statins Other (See Comments)    Pain  With joint stiffness   Pain  With joint stiffness  Other Reaction(s): Joint Pain   Pregabalin Other (See Comments)    Generalized aches and pains Generalized aches and pains Generalized aches and pains Generalized aches and pains Generalized aches and pains   Sacubitril-Valsartan Other (See Comments)    Hyperkalemia  Hyperkalemia Hyperkalemia  Hyperkalemia   Atorvastatin Other (See Comments)    Muscle aches Muscle aches   Pravastatin Other (See Comments)    Muscle aches Muscle aches   Rosuvastatin Other (See Comments)    Muscle Aches Other reaction(s): JOINT PAIN Other reaction(s): JOINT PAIN  Muscle Aches

## 2024-06-02 NOTE — Assessment & Plan Note (Addendum)
 EKG with concern of new onset atrial fibrillation/flutter, CHA2DS2-VASc at least 5 - Likely not a good candidate for DCCV but cardiology will decide -Continue to monitor -Continue with Coreg  - Cardiology started on Eliquis -patient will remain high risk due to poor her underlying functional status.

## 2024-06-02 NOTE — Progress Notes (Signed)
 Consult to HF Navigation Team Placed. Unfortunately, this patient does not meet criteria given ESRD on HD and GDMT is limited. Please feel free to reach out with any questions or medication assistance needs.   Thank you for involving the HF Navigation Team in this patient's care.  Enos Fling, PharmD, BCPS Clinical Pharmacist 01/02/2023 12:50 PM

## 2024-06-03 ENCOUNTER — Telehealth (HOSPITAL_COMMUNITY): Payer: Self-pay

## 2024-06-03 ENCOUNTER — Other Ambulatory Visit (HOSPITAL_COMMUNITY): Payer: Self-pay

## 2024-06-03 DIAGNOSIS — Z992 Dependence on renal dialysis: Secondary | ICD-10-CM | POA: Diagnosis not present

## 2024-06-03 DIAGNOSIS — I5022 Chronic systolic (congestive) heart failure: Secondary | ICD-10-CM | POA: Diagnosis not present

## 2024-06-03 DIAGNOSIS — N186 End stage renal disease: Secondary | ICD-10-CM | POA: Diagnosis not present

## 2024-06-03 DIAGNOSIS — E43 Unspecified severe protein-calorie malnutrition: Secondary | ICD-10-CM | POA: Diagnosis not present

## 2024-06-03 DIAGNOSIS — I484 Atypical atrial flutter: Secondary | ICD-10-CM | POA: Diagnosis not present

## 2024-06-03 DIAGNOSIS — I700 Atherosclerosis of aorta: Secondary | ICD-10-CM | POA: Diagnosis not present

## 2024-06-03 DIAGNOSIS — Z794 Long term (current) use of insulin: Secondary | ICD-10-CM | POA: Diagnosis not present

## 2024-06-03 DIAGNOSIS — M4642 Discitis, unspecified, cervical region: Secondary | ICD-10-CM | POA: Diagnosis not present

## 2024-06-03 DIAGNOSIS — E1142 Type 2 diabetes mellitus with diabetic polyneuropathy: Secondary | ICD-10-CM | POA: Diagnosis not present

## 2024-06-03 DIAGNOSIS — I2510 Atherosclerotic heart disease of native coronary artery without angina pectoris: Secondary | ICD-10-CM | POA: Diagnosis not present

## 2024-06-03 DIAGNOSIS — G894 Chronic pain syndrome: Secondary | ICD-10-CM | POA: Diagnosis not present

## 2024-06-03 LAB — CBC
HCT: 33 % — ABNORMAL LOW (ref 39.0–52.0)
Hemoglobin: 11.6 g/dL — ABNORMAL LOW (ref 13.0–17.0)
MCH: 31.6 pg (ref 26.0–34.0)
MCHC: 35.2 g/dL (ref 30.0–36.0)
MCV: 89.9 fL (ref 80.0–100.0)
Platelets: 240 K/uL (ref 150–400)
RBC: 3.67 MIL/uL — ABNORMAL LOW (ref 4.22–5.81)
RDW: 14.9 % (ref 11.5–15.5)
WBC: 12.3 K/uL — ABNORMAL HIGH (ref 4.0–10.5)
nRBC: 0 % (ref 0.0–0.2)

## 2024-06-03 LAB — GLUCOSE, CAPILLARY
Glucose-Capillary: 131 mg/dL — ABNORMAL HIGH (ref 70–99)
Glucose-Capillary: 123 mg/dL — ABNORMAL HIGH (ref 70–99)
Glucose-Capillary: 122 mg/dL — ABNORMAL HIGH (ref 70–99)
Glucose-Capillary: 150 mg/dL — ABNORMAL HIGH (ref 70–99)

## 2024-06-03 LAB — MAGNESIUM: Magnesium: 2.3 mg/dL (ref 1.7–2.4)

## 2024-06-03 LAB — PHOSPHORUS: Phosphorus: 5.6 mg/dL — ABNORMAL HIGH (ref 2.5–4.6)

## 2024-06-03 LAB — HEPARIN LEVEL (UNFRACTIONATED)
Heparin Unfractionated: 0.5 [IU]/mL (ref 0.30–0.70)
Heparin Unfractionated: 0.41 [IU]/mL (ref 0.30–0.70)

## 2024-06-03 LAB — ZINC: Zinc: 83 ug/dL (ref 44–115)

## 2024-06-03 LAB — POTASSIUM: Potassium: 3.3 mmol/L — ABNORMAL LOW (ref 3.5–5.1)

## 2024-06-03 MED ORDER — APIXABAN 5 MG PO TABS
5.0000 mg | ORAL_TABLET | Freq: Two times a day (BID) | ORAL | Status: DC
  Administered 2024-06-03 – 2024-06-07 (×9): 5 mg via ORAL
  Filled 2024-06-03 (×9): qty 1

## 2024-06-03 MED ORDER — LORAZEPAM 2 MG/ML IJ SOLN: 0.5000 mg | INTRAMUSCULAR | Status: DC | PRN

## 2024-06-03 MED ORDER — HYDROMORPHONE HCL 1 MG/ML IJ SOLN
0.2500 mg | INTRAMUSCULAR | Status: DC | PRN
  Administered 2024-06-04 – 2024-06-05 (×4): 0.5 mg via INTRAVENOUS
  Filled 2024-06-03 (×4): qty 0.5

## 2024-06-03 MED ORDER — HYDROXYZINE HCL 10 MG PO TABS
10.0000 mg | ORAL_TABLET | Freq: Once | ORAL | Status: AC
  Administered 2024-06-03: 10 mg via ORAL
  Filled 2024-06-03: qty 1

## 2024-06-03 NOTE — Plan of Care (Signed)

## 2024-06-03 NOTE — Progress Notes (Addendum)
 OT Cancellation Note  Patient Details Name: [name] [name] MRN: [id number] DOB: [birth year]   Cancelled Treatment:    Reason Eval/Treat Not Completed: Other (comment). Pt is currently receiving his PD with mittens on d/t pulling lines. Will hold therapy eval until after PD is complete to ensure no pulling of lines/leads.   Update 1404pm, per chart review pt's wife has elected comfort care. Will sign off on therapy evaluations at this time. New order will be needed if anything changes.  Kennisha Qin E Chrismon 06/03/2024, 12:05 PM

## 2024-06-03 NOTE — Telephone Encounter (Signed)
 Pharmacy Patient Advocate Encounter  Insurance verification completed.    The patient is insured through [facility]. Patient has Medicare and is not eligible for a copay card, but may be able to apply for patient assistance or Medicare RX Payment Plan (Patient Must reach out to their plan, if eligible for payment plan), if available.    Ran test claim for Eliquis  5mg  tablet and the current 30 day co-pay is $119.96 due to deductible.   This test claim was processed through [facility]- copay amounts may vary at other pharmacies due to pharmacy/plan contracts, or as the patient moves through the different stages of their insurance plan.

## 2024-06-03 NOTE — Progress Notes (Signed)
 " [facility]  ROUNDING NOTE   Subjective:   [name] [name]  is a 64 year old male with past medical conditions including hypertension, poorly controlled diabetes with neuropathy, GERD, COPD, CAD, and end-stage renal disease on peritoneal dialysis. Presents to the ED for shortness of breath and has been admitted for Aortic atherosclerosis [I70.0] NSTEMI (non-ST elevated myocardial infarction) [facility]) [I21.4]  Patient is known to our practice and is supervised at the [facility] clinic by Dr Dennise.   Patient remains restless, confused  Objective:  Vital signs in last 24 hours:  Temp:  [97.6 F (36.4 C)-98.6 F (37 C)] 98.6 F (37 C) (01/08 1318) Pulse Rate:  [63-100] 98 (01/08 1319) Resp:  [18-20] 19 (01/08 1318) BP: (115-125)/(53-66) 125/55 (01/08 1318) SpO2:  [72 %-100 %] 98 % (01/08 1319) Weight:  [71.8 kg] 71.8 kg (01/08 0500)  Weight change: 2.91 kg Filed Weights   05/31/24 1812 06/02/24 0451 06/03/24 0500  Weight: 68.9 kg 68.9 kg 71.8 kg    Intake/Output: I/O last 3 completed shifts: In: -173 [P.O.:236; I.V.:146] Out: 1880 [Urine:1880]   Intake/Output this shift:  No intake/output data recorded.  Physical Exam: General: NAD, restless  Head: Normocephalic, atraumatic.   Eyes: Anicteric  Lungs:  Clear to auscultation, room air  Heart: Regular rate and rhythm  Abdomen:  Soft, nontender. PD catheter  Extremities:  No peripheral edema.  Neurologic: Alert   Skin: Warm,dry, no rash, RLE wound  Access: [facility]    Basic Metabolic Panel: Recent Labs  Lab 05/31/24 1900 06/03/24 0359  NA 129*  --   K 3.7 3.3*  CL 87*  --   CO2 23  --   GLUCOSE 178*  --   BUN 53*  --   CREATININE 6.60*  --   CALCIUM  9.0  --   MG 2.1 2.3  PHOS  --  5.6*    Liver Function Tests: Recent Labs  Lab 06/02/24 0412  AST 29  ALT 15  ALKPHOS 124  BILITOT 0.3  PROT 6.5  ALBUMIN  3.1*    No results for input(s): LIPASE, AMYLASE in the last 168 hours. No  results for input(s): AMMONIA in the last 168 hours.  CBC: Recent Labs  Lab 05/31/24 1816 06/01/24 0322 06/02/24 0412 06/03/24 0359  WBC 11.9* 11.2* 13.0* 12.3*  NEUTROABS  --   --  10.6*  --   HGB 13.1 11.6* 12.5* 11.6*  HCT 37.8* 33.7* 37.5* 33.0*  MCV 92.0 92.8 94.2 89.9  PLT 246 225 224 240    Cardiac Enzymes: Recent Labs  Lab 06/02/24 0412  CKTOTAL 286     BNP: Invalid input(s): POCBNP  CBG: Recent Labs  Lab 06/02/24 2329 06/03/24 0339 06/03/24 0438 06/03/24 0855 06/03/24 1305  GLUCAP 145* 131* 123* 122* 150*    Microbiology: Results for orders placed or performed during the hospital encounter of 05/18/24  Blood culture (single)     Status: None   Collection Time: 05/17/24  8:06 PM   Specimen: BLOOD  Result Value Ref Range Status   Specimen Description BLOOD RIGHT ANTECUBITAL  Final   Special Requests   Final    BOTTLES DRAWN AEROBIC AND ANAEROBIC Blood Culture adequate volume   Culture   Final    NO GROWTH 5 DAYS Performed at [facility], [address] [place], [place] [address]    Report Status 05/23/2024 FINAL  Final  Blood culture (routine x 2)     Status: None   Collection Time: 05/18/24  1:42 AM   Specimen: BLOOD  Result Value Ref Range Status   Specimen Description BLOOD BLOOD LEFT ARM  Final   Special Requests   Final    BOTTLES DRAWN AEROBIC AND ANAEROBIC Blood Culture adequate volume   Culture   Final    NO GROWTH 5 DAYS Performed at [facility], [address] [place], [place] [address]    Report Status 05/23/2024 FINAL  Final  Blood culture (routine x 2)     Status: None   Collection Time: 05/18/24  3:08 PM   Specimen: Right Antecubital; Blood  Result Value Ref Range Status   Specimen Description RIGHT ANTECUBITAL  Final   Special Requests   Final    BOTTLES DRAWN AEROBIC AND ANAEROBIC Blood Culture results may not be optimal due to an inadequate volume of blood received in culture bottles   Culture    Final    NO GROWTH 5 DAYS Performed at [facility], [address], [place], [place] [address]    Report Status 05/23/2024 FINAL  Final    Coagulation Studies: Recent Labs    05/31/24 2003  LABPROT 15.4*  INR 1.2    Urinalysis: No results for input(s): COLORURINE, LABSPEC, PHURINE, GLUCOSEU, HGBUR, BILIRUBINUR, KETONESUR, PROTEINUR, UROBILINOGEN, NITRITE, LEUKOCYTESUR in the last 72 hours.  Invalid input(s): APPERANCEUR    Imaging: No results found.    Medications:    dialysis solution 1.5% low-MG/low-CA     dialysis solution 2.5% low-MG/low-CA     sodium thiosulfate  25 g in sodium chloride  0.9 % 200 mL Infusion for Calciphylaxis 25 g (06/02/24 1357)    apixaban   5 mg Oral BID   carvedilol   3.125 mg Oral BID WC   Chlorhexidine  Gluconate Cloth  6 each Topical Daily   dextromethorphan -guaiFENesin   1 tablet Oral BID   ezetimibe   10 mg Oral Daily   feeding supplement (NEPRO CARB STEADY)  237 mL Oral TID BM   gentamicin  cream  1 Application Topical Daily   hydrALAZINE   100 mg Oral BID   insulin  aspart  0-6 Units Subcutaneous Q4H   isosorbide  mononitrate  30 mg Oral Daily   multivitamin  1 tablet Oral QHS   sodium chloride  flush  10-40 mL Intracatheter Q12H   thiamine   100 mg Oral Daily   torsemide   20 mg Oral BID   acetaminophen , albuterol , ALPRAZolam , nitroGLYCERIN , ondansetron  (ZOFRAN ) IV, oxyCODONE   Assessment/ Plan:  [name] [name] is a 64 y.o.  male with past medical conditions including hypertension, poorly controlled diabetes with neuropathy, GERD, COPD, CAD, and end-stage renal disease on peritoneal dialysis. Presents to the ED for evaluation of RLE wound.  End stage renal disease on peritoneal dialysis.  Multiple slow flow alarms with PD treatment today. Due to patient mentation, discussed with wife that PD is not effective at this time and would like to transition to HD. Wife states patient has never wanted to do HD but  is tearful when discussing discontinuing dialysis, as a whole. Appreciate palliative assistance and patient has transitioned to comfort measures. Will hold further dialysis.   2. Wound infection, calciphylaxis.  Patient recently prescribed sodium thiosulfate  for treatments.  Will continue this treatment via IV during this admission.  Appreciate wound care recommendations.  3.  NSTEMI, elevated troponins.  Cardiology consulted.  Echo completed on 1/6 shows EF 20 to 25% with a grade 2 diastolic dysfunction, right ventricular systolic function severely reduced.  4. Anemia of chronic kidney disease Lab Results  Component  Value Date   HGB 11.6 (L) 06/03/2024    Hgb within desired range  5. Hypertension with chronic kidney disease. Prescribed amlodipine , carvedilol , clonidine , isosorbide  and torsemide .   .    LOS: 2 Wylma Tatem 1/8/20262:45 PM   "

## 2024-06-03 NOTE — Progress Notes (Signed)
 [facility] Liaison Note   Received request from Transitions of Care Manger for family interest in [facility].  Met/Spoke with spouse to confirm interest and explain services.  Will need to evaluate patient over the next day or so to determine if patient is IPU appropriate. Patient just transitioning to comfort support.    Please call with any hospice related questions or concerns  Thank you for the opportunity to participate in this patient's care    [facility],  [facility] Liaison [phone]

## 2024-06-03 NOTE — Progress Notes (Signed)
 Upon arrival wife was assisting [name] [name] in sitting on side of bed as he was desiring. She was trying to engage with him in conversation about dialysis. I offered her encouragement and assistance with interpreting his response. [name] [name] was not able to answer her questions. He was able to express his love to her when I prompted him. Wife is grieving that her mother died in a similar way. She and [name] have been together for 28 years. She was thankful for the brief interaction. Made aware of chaplain services 24/7.    06/03/24 2000  Spiritual Encounters  Type of Visit Initial  Care provided to: Pt and family  Reason for visit Grief/loss  OnCall Visit Yes

## 2024-06-03 NOTE — Progress Notes (Signed)
 Patient's wife, [name] [name] adamantly is refusing the bed alarm being placed. Pt's wife educated on the indication for the bed alarm, yet continues to refuse alarm.

## 2024-06-03 NOTE — Progress Notes (Addendum)
 "                                                                                                                                                                                                          Daily Progress Note   Patient Name: [name] [name]       Date: 06/03/2024 DOB: 1961/04/04  Age: 64 y.o. MRN#: [id number] Attending Physician: Caleen Qualia, MD Primary Care Physician: Liana Fish, NP Admit Date: 05/31/2024  Reason for Consultation/Follow-up: Establishing goals of care  Subjective: Notes and labs reviewed.  In to see patient.  Nephrology speaking with wife.  He is currently sitting in bed right now and is extremely agitated and trying to get out of bed, and remove his mittens.  Wife states he is not able to walk, he can only stand at times with assistance.  She continuously attempts to address his needs.  Staff in to bedside, and wife and I stepped outside room.  She becomes tearful and states she cannot make this decision and cannot sit alone with another family member who is dying. We moved to conference room.  She states she herself served for 7 years in the [employer].  She states she spent [place] years in the [place] with her church group as a woman of strong Christian faith.  She states over time she has changed her views on religion and on her faith, and provides elaboration.  She discusses in detail difficulties and pain in her life from losing loved ones.  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  She discusses that they moved here around 5 years ago as he worked in [employer].  She states 5 years ago he was still driving and fully functional.  She discusses that he was diagnosed with diabetes which he managed very poorly.  She states he did not want to take insulin  and check his sugars.  She states  now he is living with the consequences he has neuropathy around different parts of his body and he is unable to feel his legs.  She states at  times he is able to stand briefly but he cannot walk.  She states he spends his time in bed playing war games.   She discusses that they have talked in the past and he does not want hemodialysis.  He states he would not be accepting of the access location for HD, and also would not be accepting of having to go multiple days a week and sit for hours.  She states  they pushed PD to the point of it no longer being effective because of his desire not to do HD.  She states he literally did dialysis daily all but dinnertime and when he had doctor's appointments. She states at times in the past he has stated clearly that he wanted to die and at times has asked her to kill him.  She states she still struggles with making this decision.  She discusses the toll that helping and caring for him has provided on her, but states this is what a [employer] does.  We discussed multiple different scenarios including life-prolonging care and comfort care.  With discussing quantity and quality of life, she states she would like to switch him to comfort care.  She asked about local hospice facility.  Discussed this and that hospice liaison could speak with her further.  She states she is ready to shift him to comfort care now.  We discussed stopping life-prolonging care including glucose checks and insulin .  We discussed prognosis and timeline.  We discussed symptom management.  She again states she would like for him to go to the hospice facility, as with his symptom management needs she will not be able to care for him at home.  Upon returning to bedside staff was sitting with patient and he was resting comfortably with eyes closed and even and unlabored respirations.   I completed a MOST form today with wife and the signed original was placed in the chart. Each section of options on the form were reviewed in full detail and any questions were answered as needed. The form was scanned and sent to medical records for it to be  uploaded under ACP tab in Epic. A photocopy was also placed in the chart to be scanned into EMR. The patient outlined their wishes for the following treatment decisions:  Cardiopulmonary Resuscitation: Do Not Attempt Resuscitation (DNR/No CPR)  Medical Interventions: Comfort Measures: Keep clean, warm, and dry. Use medication by any route, positioning, wound care, and other measures to relieve pain and suffering. Use oxygen, suction and manual treatment of airway obstruction as needed for comfort. Do not transfer to the hospital unless comfort needs cannot be met in current location.  Antibiotics: No antibiotics (use other measures to relieve symptoms)  IV Fluids: No IV fluids (provide other measures to ensure comfort)  Feeding Tube: No feeding tube     Length of Stay: 2  Current Medications: Scheduled Meds:   apixaban   5 mg Oral BID   carvedilol   3.125 mg Oral BID WC   Chlorhexidine  Gluconate Cloth  6 each Topical Daily   dextromethorphan -guaiFENesin   1 tablet Oral BID   gentamicin  cream  1 Application Topical Daily   hydrALAZINE   100 mg Oral BID   isosorbide  mononitrate  30 mg Oral Daily   sodium chloride  flush  10-40 mL Intracatheter Q12H   torsemide   20 mg Oral BID    Continuous Infusions:  sodium thiosulfate  25 g in sodium chloride  0.9 % 200 mL Infusion for Calciphylaxis 25 g (06/02/24 1357)    PRN Meds: acetaminophen , albuterol , ALPRAZolam , HYDROmorphone  (DILAUDID ) injection, LORazepam , nitroGLYCERIN , ondansetron  (ZOFRAN ) IV, oxyCODONE   Physical Exam Pulmonary:     Effort: Pulmonary effort is normal.  Skin:    Nails: There is no clubbing.  Neurological:     Mental Status: He is alert.     Comments: Confused. Agitated.              Vital Signs: BP (!) 125/55  Pulse 98   Temp 98.6 F (37 C)   Resp 19   Ht 6' 1 (1.854 m)   Wt 71.8 kg   SpO2 98%   BMI 20.88 kg/m  SpO2: SpO2: 98 % O2 Device: O2 Device: Room Air O2 Flow Rate: O2 Flow Rate (L/min): 2  L/min  Intake/output summary:  Intake/Output Summary (Last 24 hours) at 06/03/2024 1522 Last data filed at 06/03/2024 1023 Gross per 24 hour  Intake 372.02 ml  Output 1880 ml  Net -1507.98 ml   LBM:   Baseline Weight: Weight: 68.9 kg Most recent weight: Weight: 71.8 kg   Patient Active Problem List   Diagnosis Date Noted   Protein-calorie malnutrition, severe 06/02/2024   Atypical atrial flutter (HCC) 06/01/2024   Generalized weakness 05/31/2024   Failure to thrive in adult 05/31/2024   NSTEMI (non-ST elevated myocardial infarction) (HCC) 05/31/2024   Discitis of cervical region 05/31/2024   Long term (current) use of antibiotics 05/31/2024   Calciphylaxis with multiple nonhealing ulcer, with fat layer exposed (HCC) 05/31/2024   Possible New onset atrial fibrillation (HCC) 05/31/2024   Leg wound, right, subsequent encounter 05/19/2024   HFrEF (heart failure with reduced ejection fraction) (HCC) 05/19/2024   Wound infection 05/18/2024   Wrist wound, open, simple, left, initial encounter 04/23/2024   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) 04/09/2024   Osteomyelitis of cervical spine (HCC) 04/08/2024   ESRD on peritoneal dialysis (HCC) 04/08/2024   HLD (hyperlipidemia) 04/08/2024   COPD (chronic obstructive pulmonary disease) (HCC) 04/08/2024   Left wrist pain 04/08/2024   Perianal abscess 04/08/2024   Gastroesophageal reflux disease without esophagitis 01/28/2024   Drug-induced constipation 01/28/2024   Hyperkalemia 03/24/2023   CAD with history of CABG x 4 03/24/2023   Hx of CABG 03/24/2023   Anemia 03/24/2023   Symptomatic bradycardia 03/24/2023   ESRD (end stage renal disease) on dialysis (HCC) 02/24/2023   Peritoneal dialysis catheter in place 02/24/2023   Aortic atherosclerosis 02/24/2023   Polyp of transverse colon 10/17/2022   Special screening for malignant neoplasms, colon 10/17/2022   Barrett's esophagus with dysplasia 09/24/2022    Hypokalemia    Wheeze    COVID-19 virus infection    Elevated d-dimer    Elevated troponin    Acute kidney injury superimposed on CKD    Hyponatremia    Chronic systolic heart failure (HCC)    Barrett's esophagus with low grade dysplasia    Diabetes mellitus with stage 4 chronic kidney disease GFR 15-29 (HCC) 06/01/2020   Serum albumin  decreased 06/01/2020   Vitamin D  deficiency 06/01/2020   Elevated hemoglobin A1c 06/01/2020   Elevated sed rate 06/01/2020   Elevated brain natriuretic peptide (BNP) level 06/01/2020   Insulin  dependent type 2 diabetes mellitus, uncontrolled 06/01/2020   Diabetes mellitus with complication, with long-term current use of insulin  (HCC) 06/01/2020   Chronic pain syndrome 05/24/2020   Pharmacologic therapy 05/24/2020   Disorder of skeletal system 05/24/2020   Problems influencing health status 05/24/2020   DDD (degenerative disc disease), cervical 05/24/2020   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, C5-6) (Left) 05/24/2020   Cervical Grade 1 Retrolisthesis of C4/C5 and C5/C6 05/24/2020   Chronic hand pain (Bilateral) 05/24/2020   Chronic leg and foot pain (Bilateral) 05/24/2020   Chronic lower extremity numbness from knee down (Bilateral) 05/24/2020   Chronic greater occipital neuralgia (Bilateral) 05/24/2020   Cervical facet syndrome (Left) 05/24/2020   Cervical disc disease with myelopathy 04/26/2020   Cervicalgia 04/09/2020   Syncope and  collapse 04/09/2020   Esophageal dysphagia    Gastroesophageal reflux disease with esophagitis without hemorrhage    PAD (peripheral artery disease) 09/27/2019   Diabetes (HCC) 09/14/2019   Swelling of limb 09/14/2019   Lymphedema 09/14/2019   Shortness of breath 09/05/2019   Chest pain 07/16/2018   Essential hypertension 04/28/2018   History of pancreatitis 11/16/2017   Statin-induced myositis 11/16/2017   Type 2 diabetes mellitus with diabetic polyneuropathy, with long-term current use of insulin  (HCC) 11/16/2017    Coronary atherosclerosis 11/15/2016   Septic olecranon bursitis 10/14/2011   Cellulitis 10/06/2011   Depression with anxiety 02/22/2011   Diabetic peripheral neuropathy (HCC) 02/22/2011   Migraines 02/22/2011   Other hyperlipidemia 02/22/2011   Sleep apnea 11/22/2010    Palliative Care Assessment & Plan   Recommendations/Plan: Recommend hospice facility placement.  Wife would like for patient to go to hospice facility.  Agree with oral oxycodone  for pain.  IV Dilaudid  PRN added if patient is unable to tolerate oral medications.  Agree with Xanax  for anxiety.  PRN IV Ativan  ordered if patient is unable to tolerate oral medications.  Code Status:    Code Status Orders  (From admission, onward)           Start     Ordered   06/03/24 1516  Do not attempt resuscitation (DNR) - Comfort care  (Code Status)  Continuous       Question Answer Comment  If patient has no pulse and is not breathing Do Not Attempt Resuscitation   In Pre-Arrest Conditions (Patient Is Breathing and Has a Pulse) Provide comfort measures. Relieve any mechanical airway obstruction. Avoid transfer unless required for comfort.   Consent: Discussion documented in EHR or advanced directives reviewed      06/03/24 1515           Code Status History     Date Active Date Inactive Code Status Order ID Comments User Context   06/01/2024 1038 06/03/2024 1515 DNR [id number] This order was created through External Result Entry [facility]   05/31/2024 2141 06/01/2024 1038 Limited: Do not attempt resuscitation (DNR) -DNR-LIMITED -Do Not Intubate/DNI  [id number]  Cleatus Delayne GAILS, MD ED   05/31/2024 0932 05/31/2024 1802 DNR [id number]  Liana Fish, NP Outpatient   05/18/2024 0841 05/19/2024 2004 Limited: Do not attempt resuscitation (DNR) -DNR-LIMITED -Do Not Intubate/DNI  [id number]  Roann Gouty, MD ED   04/08/2024 1929 04/11/2024 1752 Full Code [id number]  Hilma Rankins, MD ED   03/24/2023 1407 03/25/2023  1938 Full Code [id number]  Cox, Amy N, DO ED   02/08/2021 1637 02/11/2021 1736 Full Code [id number]  Shona Terry SAILOR, DO ED   08/31/2020 2007 09/01/2020 2102 Full Code [id number]  Seena Marsa NOVAK, MD ED       Care plan was discussed with team via epic chat  Thank you for allowing the Palliative Medicine Team to assist in the care of this patient.   Time In: 12:10 Time Out: 1:15 Total Time 65 min Prolonged Time Billed  no       Greater than 50%  of this time was spent counseling and coordinating care related to the above assessment and plan.  Camelia Lewis, NP  Please contact Palliative Medicine Team phone at [phone] for questions and concerns.       "

## 2024-06-03 NOTE — TOC Progression Note (Signed)
 Transition of Care [facility]) - Progression Note    Patient Details  Name: [name] [name] MRN: [id number] Date of Birth: 25-Feb-1961  [facility] of [facility]) CM/SW Contact  Lauraine JAYSON Carpen, LCSW Phone Number: 06/03/2024, 1:47 PM  Clinical Narrative:   Per RN, wife has agreed to comfort care and is interested in the [facility]. Liaison is aware.  Expected Discharge Plan: Home w Home Health Services Barriers to Discharge: Continued Medical Work up               Expected Discharge Plan and Services     Post Acute Care Choice: Resumption of Svcs/PTA Provider Living arrangements for the past 2 months: Single Family Home                           HH Arranged: RN [facility] Agency: [facility] Date [facility] Agency Contacted: 06/02/24   Representative spoke with at [facility] Agency: Darleene   Social Drivers of Health (SDOH) Interventions SDOH Screenings   Food Insecurity: No Food Insecurity (05/18/2024)  Housing: Low Risk (05/18/2024)  Transportation Needs: No Transportation Needs (05/18/2024)  Utilities: Not At Risk (05/18/2024)  Depression (PHQ2-9): Low Risk (01/22/2023)  Financial Resource Strain: Low Risk  (10/30/2023)   Received from [facility]  Physical Activity: Unknown (09/09/2021)   Received from [facility]  Social Connections: Unknown (05/18/2024)  Stress: No Stress Concern Present (02/23/2023)  Tobacco Use: High Risk (05/31/2024)  Health Literacy: Adequate Health Literacy (02/23/2023)    Readmission Risk Interventions    06/02/2024   10:24 AM  Readmission Risk Prevention Plan  Transportation Screening Complete  PCP or Specialist Appt within 3-5 Days Complete  HRI or Home Care Consult Complete  Social Work Consult for Recovery Care Planning/Counseling Complete  Palliative Care Screening Complete  Medication Review [employer]) Complete

## 2024-06-03 NOTE — Progress Notes (Addendum)
 Bladder scan showed 713 ml. Pt is a PD dialysis. NP Donati-Garmon made aware.  Update 0005: See new order.  Update 0500: Pt refused dressing change but was educated about its importance. Pt verbalized understanding but still refused. Will notify incoming shift.  Update 0511: Pt is non complaint with safety. Pt keeps getting up and pulling lines. Donati-Garmon made aware.   Update 0552: See new order.

## 2024-06-03 NOTE — Progress Notes (Signed)
 " Progress Note   Patient: [name] [name] FMW:[id number] DOB: 1960-06-03 DOA: 05/31/2024     2 DOS: the patient was seen and examined on 06/03/2024   Brief hospital course: Partly taken from H&P.  [name] [name] is a 64 y.o. 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  Patient developed sudden onset chest discomfort and shortness of breath.  With EMS patient was in A-fib.  On presentation mild tachycardia at 105, otherwise stable vital.  Labs notable for troponin of 639, proBNP > 35,000, leukocytosis at 11.9.  Hyponatremia with sodium at 129, potassium 3.4 and magnesium of 2.1 EKG with atrial flutter, nonspecific ST-T wave changes. CXR was negative for any acute abnormality.  Patient was given aspirin  and started on heparin  infusion.  Cardiology was consulted.  1/6: Vital stable, troponin 639>> 689, small improvement in leukocytosis to 11.2, A1c of 7.5 Echocardiogram with worsening EF now at 20 to 25%, grade 2 diastolic dysfunction, global hypokinesis, also found to have severely reduced right ventricular systolic function, probably severe aortic stenosis. Unable to urinate-did one-time In-N-Out catheter with removal of 1100 mL of urine, if he continue with unable to void-might need a Foley catheter. Cardiology is on board.  Per wife patient with declining overall health and functional status, refusing to eat and losing weight. Palliative care was consulted to discuss goals of care.  1/7: Patient more alert.  Wife  would like to avoid Xanax .  She wants placement as getting difficult for her to take care of him.  PT and OT ordered. He was given sodium Theophylline at for calciphylaxis by nephrology today.  Will complete the course of antibiotic for his prior infection.  Cardiology stopped his amlodipine  and switch to Imdur  and hydralazine  due to decrease in EF.  1/8: Patient again restless and confused.  Getting PD during rounds.  Palliative care met with family and they decided to transition him to comfort care.  They are interested in hospice facility.  Hospice was consulted.  Assessment and Plan: * NSTEMI (non-ST elevated myocardial infarction) (HCC) CAD with history of four-vessel CABG Patient with episode of shortness of breath probable chest discomfort, nonspecific EKG, troponin peaked at 689, can be due to demand ischemia.  Not a candidate for cardiac cath. Received aspirin  in the ED Completed 48 hours of heparin  Continue aspirin , carvedilol , nitroglycerin  sublingual as needed chest pain, Zetia  and Repatha  Cardiology is on board,  Echocardiogram with for the worsening of EF not to 20 to 25%, global hypokinesis. -Family now leaning to make him comfort care  Possible New onset atrial fibrillation (HCC) EKG with concern of new onset atrial fibrillation/flutter, CHA2DS2-VASc at least 5 - Likely not a good candidate for DCCV but cardiology will decide -Continue to monitor -Continue with Coreg  - Cardiology started on Eliquis -patient will remain high risk due to poor her underlying functional status.  Calciphylaxis with multiple nonhealing ulcer, with fat layer exposed (HCC) Patient with mild multiple wounds, followed at wound care center Continue pain control Continue wound care  ESRD on peritoneal dialysis [facility]) Nephrology consulted for continuation of  dialysis- -PD dialysis with the help of nephrology  Discitis of cervical region On current long-term antibiotics for discitis treatment   Continue ceftriaxone , daptomycin  and fluconazole  via PICC line - Completed a course of antibiotic now  Chronic systolic heart failure (HCC) Clinically euvolemic, repeat echocardiogram with further decrease of EF to 20 to 25%. Continue GDMT  Essential hypertension Home amlodipine  was switched with Imdur  and hydralazine  due to further decrease in EF.  COPD (chronic obstructive pulmonary disease) (HCC) Not acutely exacerbated Continue home inhalers with DuoNebs as needed  Depression with anxiety Continue home alprazolam  and nortriptyline   Type 2 diabetes mellitus with diabetic polyneuropathy, with long-term current use of insulin  (HCC) Continue basal insulin  with sliding scale coverage  Chronic pain syndrome Continue as needed oxycodone    Subjective: Patient was seen and examined today.  She was becoming agitated stating that he does not want to stay in the bed.  He was getting PD during morning rounds.  Physical Exam: Vitals:   06/03/24 0500 06/03/24 0901 06/03/24 1318 06/03/24 1319  BP:  116/60 (!) 125/55   Pulse:  67 97 98  Resp:   19   Temp:  98.2 F (36.8 C) 98.6 F (37 C)   TempSrc:  Oral    SpO2:  100%  98%  Weight: 71.8 kg     Height:       General.  Frail and malnourished gentleman, in no acute distress. Pulmonary.  Lungs clear bilaterally, normal respiratory effort. CV.  Irregularly irregular Abdomen.  Soft, nontender, nondistended, BS positive. CNS.  Alert and oriented .  No focal neurologic deficit. Extremities.  No edema,  pulses intact and symmetrical.    Data Reviewed: Prior data reviewed  Family Communication: No family at bedside, later palliative care met with wife.  Disposition: Status is: Inpatient Remains inpatient appropriate because: Severity of illness  Planned Discharge Destination: Hospice facility  DVT prophylaxis.  Heparin  infusion Time spent: 50 minutes  This record has been created using Conservation officer, historic buildings. Errors  have been sought and corrected,but may not always be located. Such creation errors do not reflect on the standard of care.   Author: Amaryllis Dare, MD 06/03/2024 3:25 PM  For on call review [email or web address].  "

## 2024-06-03 NOTE — Progress Notes (Signed)
 "  Rounding Note   Patient Name: [name] [name] Date of Encounter: 06/03/2024  [facility] Cardiologist: Evalene Lunger, MD   Subjective On rounds this morning, moderate confusion, Legs over the side of the bed, turned sideways Was helped to reposition in bed Not agitated Remains in atrial flutter with controlled rate Heparin  held this morning  Scheduled Meds:  aspirin  EC  81 mg Oral Daily   carvedilol   3.125 mg Oral BID WC   Chlorhexidine  Gluconate Cloth  6 each Topical Daily   dextromethorphan -guaiFENesin   1 tablet Oral BID   ezetimibe   10 mg Oral Daily   feeding supplement (NEPRO CARB STEADY)  237 mL Oral TID BM   gentamicin  cream  1 Application Topical Daily   hydrALAZINE   100 mg Oral BID   insulin  aspart  0-6 Units Subcutaneous Q4H   isosorbide  mononitrate  30 mg Oral Daily   multivitamin  1 tablet Oral QHS   sodium chloride  flush  10-40 mL Intracatheter Q12H   thiamine   100 mg Oral Daily   torsemide   20 mg Oral BID   Continuous Infusions:  dialysis solution 1.5% low-MG/low-CA     dialysis solution 2.5% low-MG/low-CA     sodium thiosulfate  25 g in sodium chloride  0.9 % 200 mL Infusion for Calciphylaxis 25 g (06/02/24 1357)   PRN Meds: acetaminophen , albuterol , ALPRAZolam , nitroGLYCERIN , ondansetron  (ZOFRAN ) IV, oxyCODONE    Vital Signs  Vitals:   06/02/24 2323 06/03/24 0335 06/03/24 0500 06/03/24 0901  BP: 124/63 (!) 119/53  116/60  Pulse: 63   67  Resp: 20     Temp: 97.6 F (36.4 C) 97.6 F (36.4 C)  98.2 F (36.8 C)  TempSrc: Oral Oral    SpO2: 98% 98%  100%  Weight:   71.8 kg   Height:        Intake/Output Summary (Last 24 hours) at 06/03/2024 1033 Last data filed at 06/03/2024 1023 Gross per 24 hour  Intake -182.98 ml  Output 1880 ml  Net -2062.98 ml      06/03/2024    5:00 AM 06/02/2024    4:51 AM 05/31/2024    6:12 PM  Last 3 Weights  Weight (lbs) 158 lb 4.6 oz 151 lb 14 oz 152 lb  Weight (kg) 71.8 kg 68.89 kg 68.947 kg       Telemetry Atrial flutter- Personally Reviewed  ECG   - Personally Reviewed  Physical Exam  GEN: No acute distress.   Neck: No JVD Cardiac: Irregularly irregular, no murmurs, rubs, or gallops.  Respiratory: Clear to auscultation bilaterally. GI: Soft, nontender, non-distended  MS: No edema; No deformity. Neuro:  Nonfocal  Psych: Normal affect   Labs High Sensitivity Troponin:  No results for input(s): TROPONINIHS in the last 720 hours.  Recent Labs  Lab 05/31/24 1900 05/31/24 1956 06/01/24 0934 06/01/24 1850  TRNPT 639* 689* 674* 643*       Chemistry Recent Labs  Lab 05/31/24 1900 06/02/24 0412 06/03/24 0359  NA 129*  --   --   K 3.7  --  3.3*  CL 87*  --   --   CO2 23  --   --   GLUCOSE 178*  --   --   BUN 53*  --   --   CREATININE 6.60*  --   --   CALCIUM  9.0  --   --   MG 2.1  --  2.3  PROT  --  6.5  --   ALBUMIN   --  3.1*  --   AST  --  29  --   ALT  --  15  --   ALKPHOS  --  124  --   BILITOT  --  0.3  --   GFRNONAA 9*  --   --   ANIONGAP 20*  --   --     Lipids No results for input(s): CHOL, TRIG, HDL, LABVLDL, LDLCALC, CHOLHDL in the last 168 hours.  Hematology Recent Labs  Lab 06/01/24 0322 06/02/24 0412 06/03/24 0359  WBC 11.2* 13.0* 12.3*  RBC 3.63* 3.98* 3.67*  HGB 11.6* 12.5* 11.6*  HCT 33.7* 37.5* 33.0*  MCV 92.8 94.2 89.9  MCH 32.0 31.4 31.6  MCHC 34.4 33.3 35.2  RDW 14.9 15.2 14.9  PLT 225 224 240   Thyroid   Recent Labs  Lab 06/01/24 0934  TSH 1.130    BNP Recent Labs  Lab 05/31/24 1900  PROBNP >35,000.0*    DDimer No results for input(s): DDIMER in the last 168 hours.   Radiology  No results found.  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    Assessment & Plan   1.  New onset atrial flutter: First noted May 31, 2024 this admission - Grossly asymptomatic - Adequate rate control, carvedilol  3.125 twice daily -Given encephalopathy, not a good candidate for general anesthesia, TEE cardioversion High risk of recurrent arrhythmia in the setting of cardiomyopathy -Ejection fraction previously 35 to 40% in 2024, Now down to 20 to 25% - CHA2DS2-VASc at least 5 (CHF, HTN, age x 1, DM, vascular disease) -As he is essentially bedbound, not walking, recommend we start Eliquis  5 twice daily   2.  CAD status post CABG with elevated high-sensitivity troponin:  troponin peaking at 689,down-trending -Not a good candidate for cardiac catheterization - Suspected supply/demand mismatch/demand ischemia in the setting of atrial flutter with underlying cervical discitis and ESRD  - Continue Eliquis  in place of ASA 81 mg, continue beta-blocker, Zetia  History of statin intolerance, on Repatha    3.  HFrEF: - Elevated proBNP in the setting of end-stage renal disease -No respiratory distress -On peritoneal dialysis, torsemide  Continue hydralazine  and  Imdur  30 daily Amlodipine  on hold in the setting of cardiomyopathy   4.  ESRD on PD with anemia of chronic disease: - Management per nephrology -Wife indicates that he did not want to stop hemodialysis for hospice   5.  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  HTN: Blood pressure is well controlled on today's visit. No changes made to the medications.   7.  HLD with statin intolerance: - LDL 35 in 10/2023 - PTA ezetimibe  10 mg, outpatient Repatha      For questions or updates, please contact [facility] Please consult [email or web address] for contact info under    Signed, Patrcia Schnepp, MD  06/03/2024, 10:33 AM    "

## 2024-06-03 NOTE — Consult Note (Signed)
 Pharmacy Consult Note - Anticoagulation  Pharmacy Consult for heparin  infusion Indication: chest pain/ACS Allergies[1]  PATIENT MEASUREMENTS: Height: 6' 1 (185.4 cm) Weight: 68.9 kg (151 lb 14 oz) IBW/kg (Calculated) : 79.9 HEPARIN  DW (KG): 68.9  VITAL SIGNS: Temp: 97.6 F (36.4 C) (01/08 0335) Temp Source: Oral (01/08 0335) BP: 119/53 (01/08 0335) Pulse Rate: 63 (01/07 2323)  Recent Labs    05/31/24 1900 05/31/24 1956 05/31/24 2003 06/01/24 0322 06/02/24 0412 06/02/24 1148 06/03/24 0359  HGB  --   --   --    < > 12.5*  --  11.6*  HCT  --   --   --    < > 37.5*  --  33.0*  PLT  --   --   --    < > 224  --  240  APTT  --  32  --   --   --   --   --   LABPROT  --   --  15.4*  --   --   --   --   INR  --   --  1.2  --   --   --   --   HEPARINUNFRC  --   --   --    < >  --    < > 0.50  CREATININE 6.60*  --   --   --   --   --   --   CKTOTAL  --   --   --   --  286  --   --    < > = values in this interval not displayed.    Estimated Creatinine Clearance: 10.9 mL/min (A) (by C-G formula based on SCr of 6.6 mg/dL (H)).    ASSESSMENT: 64 y.o. 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 Troponin elevated at 639 on admission. EKG on admission showing aflutter with AV block and prolonged QT. Patient is not on chronic anticoagulation per chart review.   Pharmacy has been consulted to initiate and manage heparin  intravenous infusion for ACS.  Goal(s) of therapy: Heparin  level 0.3 - 0.7 units/mL aPTT 66 - 102 seconds Monitor platelets by anticoagulation protocol: Yes   Date Time Results Comments 1/06 0322 HL <0.1 subtherapeutic 1/06 1653 HL 0.30 therapeutic x1 @ 1100 units/hr 1/07 0103 HL 0.15 Subtherapeutic 1/07 1148 HL 0.38 Therapeutic 1/07 1954 HL 0.27 Subtherapeutic @ 1300 units/hr 1/08 0359 HL 0.50 Therapeutic x  1  PLAN: Continue heparin  infusion at 1450 units/hr Recheck HL in 8 hrs to confirm therapeutic rate CBC daily while on heparin   [name] [name], PharmD, [name] 06/03/2024 5:24 AM       [1]  Allergies Allergen Reactions   Iodinated Contrast Media Other (See Comments)    Kidney disease  Kidney disease  Kidney disease  Kidney disease   Other Other (See Comments)    Pain  With joint stiffness     Statins Other (See Comments)    Pain  With joint stiffness   Pain  With joint stiffness  Other Reaction(s): Joint Pain   Pregabalin Other (See Comments)    Generalized aches and pains Generalized aches and pains Generalized aches and pains Generalized aches and pains Generalized aches and pains   Sacubitril-Valsartan Other (See Comments)    Hyperkalemia  Hyperkalemia Hyperkalemia  Hyperkalemia   Atorvastatin Other (See  Comments)    Muscle aches Muscle aches   Pravastatin Other (See Comments)    Muscle aches Muscle aches   Rosuvastatin Other (See Comments)    Muscle Aches Other reaction(s): JOINT PAIN Other reaction(s): JOINT PAIN Muscle Aches

## 2024-06-03 NOTE — Progress Notes (Signed)
 PT Cancellation Note  Patient Details Name: [name] [name] MRN: [id number] DOB: December 09, 1960   Cancelled Treatment:    Reason Eval/Treat Not Completed: Other (comment) On arrival pt sleeping, wife tearful.  She reports they just made the decision to go to hospice, states not interested in therapies at this point.  Will complete PT orders, should the situation change and he does need therapy he will need new orders.   Carmin JONELLE Deed, DPT 06/03/2024, 2:04 PM

## 2024-06-04 ENCOUNTER — Inpatient Hospital Stay: Admitting: Physician Assistant

## 2024-06-04 DIAGNOSIS — F418 Other specified anxiety disorders: Secondary | ICD-10-CM | POA: Diagnosis not present

## 2024-06-04 DIAGNOSIS — L97912 Non-pressure chronic ulcer of unspecified part of right lower leg with fat layer exposed: Secondary | ICD-10-CM | POA: Diagnosis not present

## 2024-06-04 DIAGNOSIS — I700 Atherosclerosis of aorta: Secondary | ICD-10-CM | POA: Diagnosis not present

## 2024-06-04 DIAGNOSIS — Z515 Encounter for palliative care: Secondary | ICD-10-CM | POA: Diagnosis not present

## 2024-06-04 DIAGNOSIS — I4891 Unspecified atrial fibrillation: Secondary | ICD-10-CM | POA: Diagnosis not present

## 2024-06-04 DIAGNOSIS — N186 End stage renal disease: Secondary | ICD-10-CM | POA: Diagnosis not present

## 2024-06-04 DIAGNOSIS — Z792 Long term (current) use of antibiotics: Secondary | ICD-10-CM | POA: Diagnosis not present

## 2024-06-04 DIAGNOSIS — J439 Emphysema, unspecified: Secondary | ICD-10-CM | POA: Diagnosis not present

## 2024-06-04 DIAGNOSIS — M4642 Discitis, unspecified, cervical region: Secondary | ICD-10-CM | POA: Diagnosis not present

## 2024-06-04 DIAGNOSIS — I10 Essential (primary) hypertension: Secondary | ICD-10-CM | POA: Diagnosis not present

## 2024-06-04 DIAGNOSIS — I5022 Chronic systolic (congestive) heart failure: Secondary | ICD-10-CM | POA: Diagnosis not present

## 2024-06-04 DIAGNOSIS — I214 Non-ST elevation (NSTEMI) myocardial infarction: Secondary | ICD-10-CM | POA: Diagnosis not present

## 2024-06-04 DIAGNOSIS — G894 Chronic pain syndrome: Secondary | ICD-10-CM | POA: Diagnosis not present

## 2024-06-04 DIAGNOSIS — Z992 Dependence on renal dialysis: Secondary | ICD-10-CM | POA: Diagnosis not present

## 2024-06-04 DIAGNOSIS — E43 Unspecified severe protein-calorie malnutrition: Secondary | ICD-10-CM | POA: Diagnosis not present

## 2024-06-04 DIAGNOSIS — I2510 Atherosclerotic heart disease of native coronary artery without angina pectoris: Secondary | ICD-10-CM | POA: Diagnosis not present

## 2024-06-04 DIAGNOSIS — Z7189 Other specified counseling: Secondary | ICD-10-CM | POA: Diagnosis not present

## 2024-06-04 DIAGNOSIS — Z9861 Coronary angioplasty status: Secondary | ICD-10-CM | POA: Diagnosis not present

## 2024-06-04 LAB — VITAMIN C: Vitamin C: 0.1 mg/dL — ABNORMAL LOW (ref 0.4–2.0)

## 2024-06-04 LAB — GLUCOSE, CAPILLARY: Glucose-Capillary: 288 mg/dL — ABNORMAL HIGH (ref 70–99)

## 2024-06-04 LAB — VITAMIN A: Vitamin A (Retinoic Acid): 29.1 ug/dL (ref 22.0–69.5)

## 2024-06-04 MED ORDER — EZETIMIBE 10 MG PO TABS
10.0000 mg | ORAL_TABLET | Freq: Every day | ORAL | Status: DC
  Administered 2024-06-04 – 2024-06-07 (×4): 10 mg via ORAL
  Filled 2024-06-04 (×4): qty 1

## 2024-06-04 MED ORDER — TORSEMIDE 20 MG PO TABS
20.0000 mg | ORAL_TABLET | Freq: Every day | ORAL | Status: DC
  Administered 2024-06-05 – 2024-06-07 (×3): 20 mg via ORAL
  Filled 2024-06-04 (×3): qty 1

## 2024-06-04 MED ORDER — INSULIN ASPART 100 UNIT/ML IJ SOLN
0.0000 [IU] | INTRAMUSCULAR | Status: DC
  Administered 2024-06-04: 8 [IU] via SUBCUTANEOUS
  Administered 2024-06-05: 3 [IU] via SUBCUTANEOUS
  Administered 2024-06-05: 2 [IU] via SUBCUTANEOUS
  Administered 2024-06-05: 3 [IU] via SUBCUTANEOUS
  Administered 2024-06-05 – 2024-06-06 (×2): 2 [IU] via SUBCUTANEOUS
  Administered 2024-06-06: 5 [IU] via SUBCUTANEOUS
  Administered 2024-06-06: 3 [IU] via SUBCUTANEOUS
  Administered 2024-06-06: 2 [IU] via SUBCUTANEOUS
  Administered 2024-06-07: 3 [IU] via SUBCUTANEOUS
  Administered 2024-06-07 (×2): 2 [IU] via SUBCUTANEOUS
  Filled 2024-06-04: qty 1
  Filled 2024-06-04: qty 2
  Filled 2024-06-04: qty 8
  Filled 2024-06-04 (×3): qty 2
  Filled 2024-06-04: qty 3
  Filled 2024-06-04: qty 5
  Filled 2024-06-04: qty 3
  Filled 2024-06-04: qty 2
  Filled 2024-06-04: qty 3
  Filled 2024-06-04: qty 2
  Filled 2024-06-04: qty 3

## 2024-06-04 MED ORDER — POTASSIUM CHLORIDE CRYS ER 20 MEQ PO TBCR
20.0000 meq | EXTENDED_RELEASE_TABLET | Freq: Once | ORAL | Status: AC
  Administered 2024-06-04: 20 meq via ORAL
  Filled 2024-06-04: qty 1

## 2024-06-04 MED ORDER — POLYETHYLENE GLYCOL 3350 17 G PO PACK
17.0000 g | PACK | Freq: Every day | ORAL | Status: DC | PRN
  Administered 2024-06-06: 17 g via ORAL
  Filled 2024-06-04: qty 1

## 2024-06-04 NOTE — Plan of Care (Signed)
  Problem: Education: Goal: Ability to describe self-care measures that may prevent or decrease complications (Diabetes Survival Skills Education) will improve Outcome: Progressing Goal: Individualized Educational Video(s) Outcome: Progressing   Problem: Coping: Goal: Ability to adjust to condition or change in health will improve Outcome: Progressing   Problem: Fluid Volume: Goal: Ability to maintain a balanced intake and output will improve Outcome: Progressing   Problem: Health Behavior/Discharge Planning: Goal: Ability to identify and utilize available resources and services will improve Outcome: Progressing Goal: Ability to manage health-related needs will improve Outcome: Progressing   Problem: Metabolic: Goal: Ability to maintain appropriate glucose levels will improve Outcome: Progressing   Problem: Nutritional: Goal: Maintenance of adequate nutrition will improve Outcome: Progressing Goal: Progress toward achieving an optimal weight will improve Outcome: Progressing   Problem: Skin Integrity: Goal: Risk for impaired skin integrity will decrease Outcome: Progressing   Problem: Tissue Perfusion: Goal: Adequacy of tissue perfusion will improve Outcome: Progressing   Problem: Education: Goal: Understanding of cardiac disease, CV risk reduction, and recovery process will improve Outcome: Progressing Goal: Individualized Educational Video(s) Outcome: Progressing   Problem: Activity: Goal: Ability to tolerate increased activity will improve Outcome: Progressing   Problem: Cardiac: Goal: Ability to achieve and maintain adequate cardiovascular perfusion will improve Outcome: Progressing   Problem: Health Behavior/Discharge Planning: Goal: Ability to safely manage health-related needs after discharge will improve Outcome: Progressing   Problem: Education: Goal: Knowledge of General Education information will improve Description: Including pain rating scale,  medication(s)/side effects and non-pharmacologic comfort measures Outcome: Progressing   Problem: Health Behavior/Discharge Planning: Goal: Ability to manage health-related needs will improve Outcome: Progressing   Problem: Clinical Measurements: Goal: Ability to maintain clinical measurements within normal limits will improve Outcome: Progressing Goal: Will remain free from infection Outcome: Progressing Goal: Diagnostic test results will improve Outcome: Progressing Goal: Respiratory complications will improve Outcome: Progressing Goal: Cardiovascular complication will be avoided Outcome: Progressing   Problem: Activity: Goal: Risk for activity intolerance will decrease Outcome: Progressing   Problem: Nutrition: Goal: Adequate nutrition will be maintained Outcome: Progressing   Problem: Coping: Goal: Level of anxiety will decrease Outcome: Progressing   Problem: Elimination: Goal: Will not experience complications related to bowel motility Outcome: Progressing Goal: Will not experience complications related to urinary retention Outcome: Progressing   Problem: Pain Managment: Goal: General experience of comfort will improve and/or be controlled Outcome: Progressing   Problem: Safety: Goal: Ability to remain free from injury will improve Outcome: Progressing   Problem: Skin Integrity: Goal: Risk for impaired skin integrity will decrease Outcome: Progressing

## 2024-06-04 NOTE — Progress Notes (Signed)
 "                                                                                                                                                                                                          Daily Progress Note   Patient Name: [name] [name]       Date: 06/04/2024 DOB: 01-Feb-1961  Age: 64 y.o. MRN#: [id number] Attending Physician: Caleen Qualia, MD Primary Care Physician: Liana Fish, NP Admit Date: 05/31/2024  Reason for Consultation/Follow-up: Establishing goals of care  HPI/Brief Hospital Review: 64 y.o. 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  Patient developed sudden onset chest discomfort and shortness of breath.   Admitted and being treated for NSTEMI, possible new onset A. Fib, calciphylaxis with nonhealing ulcer, ESRD on PD  PMT transitioned to CMO 1/8--wife interested in hospice IPU  Following up today to continue goals of care conversations and coordination of care.  Subjective: Chart reviewed: Progress Notes:Primary team, cardiology, nephrology progress notes reviewed since admission Advanced Directives:MOST form reviewed in Vynca  Visited with [name] [name] at his bedside. He is resting in bed, does not awaken or acknowledge my presence in room. He appears comfortable and in no acute distress. Wife at bedside during time of visit.  Wife shares she continues to struggle with her decision of transitioning to comfort care. She shares her husband is her life, no children or other  family. She speaks of her dog and being comforted by her.  Wife shares she finds comfort in knowing that [name] [name] has expressed to her he does not feel his current quality of life is acceptable and feels as though he is suffering. She acknowledges continuing with aggressive medical interventions would have a further decline on his quality of life. She is struggling with not being able to have this conversation with [name] [name] as he remain confused and drowsy. She confirms she wishes to continue with CMO at this time. She again confirms her desire for IPU.  From review of MAR, Mr. Greenslade did not require as needed medications for anxiety/agitation overnight--concern related to behaviors for [facility] admission. He did require a dose  of IV hydromorphone  for pain earlier this AM--regimen seems to be maintaining comfort at this time, no recommendations for changes or modifications.  Update provided to [facility] hospice liaison--they continue to follow for [facility] eligibility.  Answered and addressed all questions and concerns. PMT to continue to follow for ongoing needs and support.   Objective:  Physical Exam Constitutional:      General: He is not in acute distress.    Appearance: He is ill-appearing.     Comments: Sleeping, does not acknowledge my presence in room  Pulmonary:     Effort: Pulmonary effort is normal. No respiratory distress.             Vital Signs: BP (!) 140/97 (BP Location: Right Arm)   Pulse 93   Temp 98.3 F (36.8 C)   Resp 19   Ht 6' 1 (1.854 m)   Wt 71.8 kg   SpO2 98%   BMI 20.88 kg/m  SpO2: SpO2: 98 % O2 Device: O2 Device: Room Air O2 Flow Rate: O2 Flow Rate (L/min): 2 L/min   Palliative Care Assessment & Plan   Assessment/Recommendation/Plan  CMO remains Hospice liaison following for IPU eligibility Symptoms well managed with current regimen  Care plan was discussed with primary team, TOC and hospice liaison.  Thank you for allowing the Palliative Medicine Team to assist  in the care of this patient.  I personally spent a total of 35 minutes in the care of the patient today including preparing to see the patient, getting/reviewing separately obtained history, performing a medically appropriate exam/evaluation, counseling and educating, referring and communicating with other health care professionals, documenting clinical information in the EHR, and coordinating care.   Waddell Lesches, DNP, AGNP-C Palliative Medicine   Please contact Palliative Medicine Team phone at [phone] for questions and concerns.   "

## 2024-06-04 NOTE — Progress Notes (Signed)
 "  Rounding Note   Patient Name: [name] [name] Date of Encounter: 06/04/2024  [facility] Cardiologist: Evalene Lunger, MD   Subjective Ongoing confusion. Denies chest pain and shortness of breath. Remains in atrial flutter which is well rate controlled.   Scheduled Meds:  apixaban   5 mg Oral BID   carvedilol   3.125 mg Oral BID WC   Chlorhexidine  Gluconate Cloth  6 each Topical Daily   dextromethorphan -guaiFENesin   1 tablet Oral BID   gentamicin  cream  1 Application Topical Daily   hydrALAZINE   100 mg Oral BID   isosorbide  mononitrate  30 mg Oral Daily   sodium chloride  flush  10-40 mL Intracatheter Q12H   torsemide   20 mg Oral BID   Continuous Infusions:  sodium thiosulfate  25 g in sodium chloride  0.9 % 200 mL Infusion for Calciphylaxis Stopped (06/02/24 1457)   PRN Meds: acetaminophen , albuterol , ALPRAZolam , HYDROmorphone  (DILAUDID ) injection, LORazepam , nitroGLYCERIN , ondansetron  (ZOFRAN ) IV, oxyCODONE    Vital Signs  Vitals:   06/03/24 1319 06/03/24 1829 06/03/24 2025 06/04/24 0824  BP:  (!) 141/79 134/66 (!) 140/97  Pulse: 98  68 93  Resp:   20 19  Temp:   97.9 F (36.6 C) 98.3 F (36.8 C)  TempSrc:      SpO2: 98% 98% 99% 98%  Weight:      Height:        Intake/Output Summary (Last 24 hours) at 06/04/2024 0846 Last data filed at 06/03/2024 2227 Gross per 24 hour  Intake 210.19 ml  Output 800 ml  Net -589.81 ml      06/03/2024    5:00 AM 06/02/2024    4:51 AM 05/31/2024    6:12 PM  Last 3 Weights  Weight (lbs) 158 lb 4.6 oz 151 lb 14 oz 152 lb  Weight (kg) 71.8 kg 68.89 kg 68.947 kg      Telemetry Atrial flutter, rate 60s-80s - Personally Reviewed  Physical Exam  GEN: No acute distress.   Neck: No JVD Cardiac: Irregular, no murmurs, rubs, or gallops.  Respiratory: Clear to auscultation bilaterally. GI: Soft, nontender, non-distended  MS: No edema; No deformity. Neuro:  Nonfocal  Psych: Normal affect   Labs High Sensitivity Troponin:  No  results for input(s): TROPONINIHS in the last 720 hours.  Recent Labs  Lab 05/31/24 1900 05/31/24 1956 06/01/24 0934 06/01/24 1850  TRNPT 639* 689* 674* 643*       Chemistry Recent Labs  Lab 05/31/24 1900 06/02/24 0412 06/03/24 0359  NA 129*  --   --   K 3.7  --  3.3*  CL 87*  --   --   CO2 23  --   --   GLUCOSE 178*  --   --   BUN 53*  --   --   CREATININE 6.60*  --   --   CALCIUM  9.0  --   --   MG 2.1  --  2.3  PROT  --  6.5  --   ALBUMIN   --  3.1*  --   AST  --  29  --   ALT  --  15  --   ALKPHOS  --  124  --   BILITOT  --  0.3  --   GFRNONAA 9*  --   --   ANIONGAP 20*  --   --     Lipids No results for input(s): CHOL, TRIG, HDL, LABVLDL, LDLCALC, CHOLHDL in the last 168 hours.  Hematology Recent Labs  Lab 06/01/24 0322 06/02/24 0412 06/03/24 0359  WBC 11.2* 13.0* 12.3*  RBC 3.63* 3.98* 3.67*  HGB 11.6* 12.5* 11.6*  HCT 33.7* 37.5* 33.0*  MCV 92.8 94.2 89.9  MCH 32.0 31.4 31.6  MCHC 34.4 33.3 35.2  RDW 14.9 15.2 14.9  PLT 225 224 240   Thyroid   Recent Labs  Lab 06/01/24 0934  TSH 1.130    BNP Recent Labs  Lab 05/31/24 1900  PROBNP >35,000.0*    DDimer No results for input(s): DDIMER in the last 168 hours.   Radiology  No results found.  Cardiac Studies  06/02/2023 2D echo 1. Left ventricular ejection fraction, by estimation, is 20 to 25%. The  left ventricle has severely decreased function. The left ventricle  demonstrates global hypokinesis. The left ventricular internal cavity size  was mildly dilated. Left ventricular  diastolic parameters are consistent with Grade II diastolic dysfunction  (pseudonormalization). Elevated left atrial pressure.   2. Right ventricular systolic function is severely reduced. The right  ventricular size is normal.   3. Left atrial size was mildly dilated.   4. The mitral valve is degenerative. Mild mitral valve regurgitation. No  evidence of mitral stenosis. Moderate to severe mitral  annular  calcification.   5. Tricuspid valve regurgitation is mild to moderate.   6. The aortic valve is tricuspid. There is moderate calcification of the  aortic valve. There is severe thickening of the aortic valve. Aortic valve  regurgitation is not visualized. Probably severe low-flow/low-gradient  aortic valve stenosis (mean  gradient 14 mmHg, AVA 0.91 cm^2, DI 0.24).   7. The inferior vena cava is dilated in size with <50% respiratory  variability, suggesting right atrial pressure of 15 mmHg.   8. Cannot exclude a small PFO.   Patient Profile   64 y.o. 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   Assessment & Plan   New onset atrial flutter - First noted during this admission on 1/5 - Appears grossly asymptomatic - EF reduced from prior at 20-25% - Not a good candidate for TEE cardioversion given encephalopathy - CHA2DS2-VASc at least 5 (CHF, HTN, age x 1, DM, vascular disease)  - Transitioned to Eliquis  5 mg twice daily yesterday - Remains in atrial flutter with rate well controlled at 60s-80s bpm - Continue carvedilol  3.125 mg twice daily  Coronary artery disease Elevated troponin - Troponin mildly elevated and flat trending, peaked at 689 - Unclear if this was reflective of supply/demand in the setting of atrial flutter and ESRD vs NSTEMI - Echo this admission with EF down from prior, now 20-25% - Patient is not a candidate for invasive ischemic evaluation in the setting of encephalopathy and ESRD - Completed 48 hours of heparin  - Consider addition of Plavix for medical management, will discuss with MD - Continue  Imdur  and ezetimibe . Eliquis  in place of ASA.   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

## 2024-06-04 NOTE — Progress Notes (Addendum)
 Dressings changed per order, CDI.

## 2024-06-04 NOTE — Progress Notes (Signed)
 Patient's wife refusing bed alarm to be engaged. Pt's wife educated on the safety indications for the bed alarm and the risk at hand if bed alarm is not engaged. Pt's wife continues to refuse engagement of bed alarm.

## 2024-06-04 NOTE — Progress Notes (Signed)
 " Progress Note   Patient: [name] [name] FMW:[id number] DOB: [birth year] DOA: 05/31/2024     3 DOS: the patient was seen and examined on 06/04/2024   Brief hospital course: Partly taken from H&P.  [name] [name] is a 64 y.o. 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  Patient developed sudden onset chest discomfort and shortness of breath.  With EMS patient was in A-fib.  On presentation mild tachycardia at 105, otherwise stable vital.  Labs notable for troponin of 639, proBNP > 35,000, leukocytosis at 11.9.  Hyponatremia with sodium at 129, potassium 3.4 and magnesium of 2.1 EKG with atrial flutter, nonspecific ST-T wave changes. CXR was negative for any acute abnormality.  Patient was given aspirin  and started on heparin  infusion.  Cardiology was consulted.  1/6: Vital stable, troponin 639>> 689, small improvement in leukocytosis to 11.2, A1c of 7.5 Echocardiogram with worsening EF now at 20 to 25%, grade 2 diastolic dysfunction, global hypokinesis, also found to have severely reduced right ventricular systolic function, probably severe aortic stenosis. Unable to urinate-did one-time In-N-Out catheter with removal of 1100 mL of urine, if he continue with unable to void-might need a Foley catheter. Cardiology is on board.  Per wife patient with declining overall health and functional status, refusing to eat and losing weight. Palliative care was consulted to discuss goals of care.  1/7: Patient more alert.  Wife  would like to avoid Xanax .  She wants placement as getting difficult for her to take care of him.  PT and OT ordered. He was given sodium Theophylline at for calciphylaxis by nephrology today.  Will complete the course of antibiotic for his prior infection.  Cardiology stopped his amlodipine  and switch to Imdur  and hydralazine  due to decrease in EF.  1/8: Patient again restless and confused.  Getting PD during rounds.  Palliative care met with family and they decided to transition him to comfort care.  They are interested in hospice facility.  Hospice was consulted.  1/9: Patient with intermittent restlessness and remained confused.  Hospice is monitoring his symptom burden for another day or 2 before deciding about taking him to hospice facility.  Wife is very overwhelmed stating that she cannot take care of him at this point.  Assessment and Plan: * NSTEMI (non-ST elevated myocardial infarction) (HCC) CAD with history of four-vessel CABG Patient with episode of shortness of breath probable chest discomfort, nonspecific EKG, troponin peaked at 689, can be due to demand ischemia.  Not a candidate for cardiac cath. Received aspirin  in the ED Completed 48 hours of heparin  Continue aspirin , carvedilol , nitroglycerin  sublingual as needed chest pain, Zetia  and Repatha  Cardiology is on board,  Echocardiogram with for the worsening of EF not to 20 to 25%, global hypokinesis. -Family now leaning to make him comfort care  Possible New onset atrial fibrillation (HCC) EKG with concern of new onset atrial fibrillation/flutter, CHA2DS2-VASc at least 5 - Likely not a good candidate for DCCV but cardiology will decide -Continue to monitor -Continue with Coreg  - Cardiology started on Eliquis -patient will remain high risk  due to poor her underlying functional status.  Calciphylaxis with multiple nonhealing ulcer, with fat layer exposed (HCC) Patient with mild multiple wounds, followed at wound care  center Continue pain control Continue wound care  ESRD on peritoneal dialysis [facility]) Nephrology consulted for continuation of dialysis- -PD dialysis is currently being held as they like to go hospice.  Discitis of cervical region On current long-term antibiotics for discitis treatment  Continue ceftriaxone , daptomycin  and fluconazole  via PICC line - Completed a course of antibiotic now  Chronic systolic heart failure (HCC) Clinically euvolemic, repeat echocardiogram with further decrease of EF to 20 to 25%. Continue GDMT  Essential hypertension Home amlodipine  was switched with Imdur  and hydralazine  due to further decrease in EF.  COPD (chronic obstructive pulmonary disease) (HCC) Not acutely exacerbated Continue home inhalers with DuoNebs as needed  Depression with anxiety Continue home alprazolam  and nortriptyline   Type 2 diabetes mellitus with diabetic polyneuropathy, with long-term current use of insulin  (HCC) Continue basal insulin  with sliding scale coverage  Chronic pain syndrome Continue as needed oxycodone    Subjective: Patient still quite confused and unable to participate with medical care.  Wife at bedside who appears very overwhelmed and hoping that he can go to hospice facility as she will not be able to provide the neccessary care.  Physical Exam: Vitals:   06/03/24 1319 06/03/24 1829 06/03/24 2025 06/04/24 0824  BP:  (!) 141/79 134/66 (!) 140/97  Pulse: 98  68 93  Resp:   20 19  Temp:   97.9 F (36.6 C) 98.3 F (36.8 C)  TempSrc:      SpO2: 98% 98% 99% 98%  Weight:      Height:       General.  Frail and malnourished gentleman, appears much older than stated age in no acute distress. Pulmonary.  Lungs clear bilaterally, normal respiratory effort. CV.  Regular rate and rhythm, no JVD, rub or murmur. Abdomen.  Soft, nontender, nondistended, BS positive. CNS.  Alert and oriented to name only.  No focal neurologic deficit. Extremities.  No edema,  pulses intact and symmetrical.    Data Reviewed: Prior data reviewed  Family Communication: Discussed with wife at bedside  Disposition: Status is: Inpatient Remains inpatient appropriate because: Severity of illness  Planned Discharge Destination: Hospice facility  DVT prophylaxis.  Heparin  infusion Time spent: 50 minutes  This record has been created using Conservation officer, historic buildings. Errors have been sought and corrected,but may not always be located. Such creation errors do not reflect on the standard of care.   Author: Amaryllis Dare, MD 06/04/2024 3:50 PM  For on call review [email or web address].  "

## 2024-06-04 NOTE — Care Management Important Message (Signed)
 Important Message  Patient Details  Name: [name] [name] MRN: [id number] Date of Birth: [birth year]   Important Message Given:  Yes - Medicare IM     [name] [name] 06/04/2024, 3:24 PM

## 2024-06-04 NOTE — Assessment & Plan Note (Signed)
 Nephrology consulted for continuation of dialysis- -PD dialysis is currently being held as they like to go hospice.

## 2024-06-05 DIAGNOSIS — Z515 Encounter for palliative care: Secondary | ICD-10-CM | POA: Diagnosis not present

## 2024-06-05 DIAGNOSIS — I2510 Atherosclerotic heart disease of native coronary artery without angina pectoris: Secondary | ICD-10-CM | POA: Diagnosis not present

## 2024-06-05 DIAGNOSIS — Z9861 Coronary angioplasty status: Secondary | ICD-10-CM | POA: Diagnosis not present

## 2024-06-05 DIAGNOSIS — I214 Non-ST elevation (NSTEMI) myocardial infarction: Secondary | ICD-10-CM | POA: Diagnosis not present

## 2024-06-05 DIAGNOSIS — I700 Atherosclerosis of aorta: Secondary | ICD-10-CM | POA: Diagnosis not present

## 2024-06-05 DIAGNOSIS — E43 Unspecified severe protein-calorie malnutrition: Secondary | ICD-10-CM | POA: Diagnosis not present

## 2024-06-05 DIAGNOSIS — I5022 Chronic systolic (congestive) heart failure: Secondary | ICD-10-CM | POA: Diagnosis not present

## 2024-06-05 DIAGNOSIS — L97912 Non-pressure chronic ulcer of unspecified part of right lower leg with fat layer exposed: Secondary | ICD-10-CM | POA: Diagnosis not present

## 2024-06-05 LAB — BASIC METABOLIC PANEL WITH GFR
Anion gap: 40 — ABNORMAL HIGH (ref 5–15)
BUN: 66 mg/dL — ABNORMAL HIGH (ref 8–23)
CO2: 17 mmol/L — ABNORMAL LOW (ref 22–32)
Calcium: 10.1 mg/dL (ref 8.9–10.3)
Chloride: 90 mmol/L — ABNORMAL LOW (ref 98–111)
Creatinine, Ser: 7.4 mg/dL — ABNORMAL HIGH (ref 0.61–1.24)
GFR, Estimated: 8 mL/min — ABNORMAL LOW
Glucose, Bld: 126 mg/dL — ABNORMAL HIGH (ref 70–99)
Potassium: 4.1 mmol/L (ref 3.5–5.1)
Sodium: 148 mmol/L — ABNORMAL HIGH (ref 135–145)

## 2024-06-05 LAB — GLUCOSE, CAPILLARY
Glucose-Capillary: 113 mg/dL — ABNORMAL HIGH (ref 70–99)
Glucose-Capillary: 118 mg/dL — ABNORMAL HIGH (ref 70–99)
Glucose-Capillary: 134 mg/dL — ABNORMAL HIGH (ref 70–99)
Glucose-Capillary: 147 mg/dL — ABNORMAL HIGH (ref 70–99)
Glucose-Capillary: 155 mg/dL — ABNORMAL HIGH (ref 70–99)
Glucose-Capillary: 165 mg/dL — ABNORMAL HIGH (ref 70–99)
Glucose-Capillary: 175 mg/dL — ABNORMAL HIGH (ref 70–99)

## 2024-06-05 MED ORDER — OXYCODONE HCL 5 MG PO TABS
5.0000 mg | ORAL_TABLET | ORAL | Status: DC | PRN
  Administered 2024-06-06 (×2): 5 mg via ORAL
  Filled 2024-06-05 (×2): qty 1

## 2024-06-05 MED ORDER — ALPRAZOLAM 0.5 MG PO TABS
0.5000 mg | ORAL_TABLET | Freq: Three times a day (TID) | ORAL | Status: DC | PRN
  Administered 2024-06-05 – 2024-06-06 (×2): 0.5 mg via ORAL
  Filled 2024-06-05 (×2): qty 1

## 2024-06-05 MED ORDER — HYDROMORPHONE HCL 1 MG/ML PO LIQD
0.5000 mg | ORAL | Status: DC | PRN
  Administered 2024-06-06 – 2024-06-07 (×2): 0.5 mg via ORAL
  Filled 2024-06-05 (×3): qty 1

## 2024-06-05 NOTE — Progress Notes (Signed)
 " Progress Note   Patient: [name] [name] FMW:[id number] DOB: 1960-07-23 DOA: 05/31/2024     4 DOS: the patient was seen and examined on 06/05/2024   Brief hospital course: Partly taken from H&P.  [name] [name] is a 64 y.o. 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  Patient developed sudden onset chest discomfort and shortness of breath.  With EMS patient was in A-fib.  On presentation mild tachycardia at 105, otherwise stable vital.  Labs notable for troponin of 639, proBNP > 35,000, leukocytosis at 11.9.  Hyponatremia with sodium at 129, potassium 3.4 and magnesium of 2.1 EKG with atrial flutter, nonspecific ST-T wave changes. CXR was negative for any acute abnormality.  Patient was given aspirin  and started on heparin  infusion.  Cardiology was consulted.  1/6: Vital stable, troponin 639>> 689, small improvement in leukocytosis to 11.2, A1c of 7.5 Echocardiogram with worsening EF now at 20 to 25%, grade 2 diastolic dysfunction, global hypokinesis, also found to have severely reduced right ventricular systolic function, probably severe aortic stenosis. Unable to urinate-did one-time In-N-Out catheter with removal of 1100 mL of urine, if he continue with unable to void-might need a Foley catheter. Cardiology is on board.  Per wife patient with declining overall health and functional status, refusing to eat and losing weight. Palliative care was consulted to discuss goals of care.  1/7: Patient more alert.  Wife  would like to avoid Xanax .  She wants placement as getting difficult for her to take care of him.  PT and OT ordered. He was given sodium Theophylline at for calciphylaxis by nephrology today.  Will complete the course of antibiotic for his prior infection.  Cardiology stopped his amlodipine  and switch to Imdur  and hydralazine  due to decrease in EF.  1/8: Patient again restless and confused.  Getting PD during rounds.  Palliative care met with family and they decided to transition him to comfort care.  They are interested in hospice facility.  Hospice was consulted.  1/9: Patient with intermittent restlessness and remained confused.  Hospice is monitoring his symptom burden for another day or 2 before deciding about taking him to hospice facility.  Wife is very overwhelmed stating that she cannot take care of him at this point.  1/10: Patient currently stable, wife now wants him to be home with hospice.  Need to have equipment delivered before discharge.  Assessment and Plan: * NSTEMI (non-ST elevated myocardial infarction) (HCC) CAD with history of four-vessel CABG Patient with episode of shortness of breath probable chest discomfort, nonspecific EKG, troponin peaked at 689, can be due to demand ischemia.  Not a candidate for cardiac cath. Received aspirin  in the ED Completed 48 hours of heparin  Continue aspirin , carvedilol , nitroglycerin  sublingual as needed chest pain, Zetia  and Repatha  Cardiology is on board,  Echocardiogram with for the worsening of EF not to 20 to 25%, global hypokinesis. -Family now leaning to make him comfort care  Possible New onset atrial fibrillation (HCC) EKG with concern of new onset atrial fibrillation/flutter, CHA2DS2-VASc at least 5 - Likely not a good  candidate for DCCV but cardiology will decide -Continue to monitor -Continue with Coreg  - Cardiology started on Eliquis -patient will remain high risk due to poor her underlying functional  status.  Calciphylaxis with multiple nonhealing ulcer, with fat layer exposed (HCC) Patient with mild multiple wounds, followed at wound care center Continue pain control Continue wound care  ESRD on peritoneal dialysis [facility]) Nephrology consulted for continuation of dialysis- -PD dialysis is currently being held as they like to go hospice.  Discitis of cervical region On current long-term antibiotics for discitis treatment  Continue ceftriaxone , daptomycin  and fluconazole  via PICC line - Completed a course of antibiotic now  Chronic HFrEF (heart failure with reduced ejection fraction) (HCC) Clinically euvolemic, repeat echocardiogram with further decrease of EF to 20 to 25%. Continue GDMT  Essential hypertension Home amlodipine  was switched with Imdur  and hydralazine  due to further decrease in EF.  COPD (chronic obstructive pulmonary disease) (HCC) Not acutely exacerbated Continue home inhalers with DuoNebs as needed  Depression with anxiety Continue home alprazolam  and nortriptyline   Type 2 diabetes mellitus with diabetic polyneuropathy, with long-term current use of insulin  (HCC) Continue basal insulin  with sliding scale coverage  Chronic pain syndrome Continue as needed oxycodone    Subjective: Patient was sitting at the side of bed and talking with some family members when seen today.  Appears comfortable.  Complaining of some neck pain and wants to sit in the chair.  Physical Exam: Vitals:   06/04/24 0824 06/04/24 1954 06/04/24 2110 06/05/24 0752  BP: (!) 140/97 113/62 113/62 121/62  Pulse: 93   (!) 50  Resp: 19 18  17   Temp: 98.3 F (36.8 C) 97.8 F (36.6 C)  98.6 F (37 C)  TempSrc:      SpO2: 98% 99%  (!) 87%  Weight:      Height:       General.  Frail and malnourished gentleman, in no acute distress. Pulmonary.  Lungs clear bilaterally, normal respiratory effort. CV.  Regular rate and rhythm, no JVD, rub or murmur. Abdomen.  Soft, nontender,  nondistended, BS positive. CNS.  Alert and oriented .  No focal neurologic deficit. Extremities.  No edema, pulses intact and symmetrical.  Lower extremity have bandages with chronic wound. Psychiatry.  Judgment and insight appears normal.     Data Reviewed: Prior data reviewed  Family Communication: Discussed with wife at bedside  Disposition: Status is: Inpatient Remains inpatient appropriate because: Severity of illness  Planned Discharge Destination: Hospice facility  DVT prophylaxis.  Heparin  infusion Time spent: 50 minutes  This record has been created using Conservation officer, historic buildings. Errors have been sought and corrected,but may not always be located. Such creation errors do not reflect on the standard of care.   Author: Amaryllis Dare, MD 06/05/2024 4:16 PM  For on call review [email or web address].  "

## 2024-06-05 NOTE — Progress Notes (Signed)
 "                                                                                                                                                                                                          Daily Progress Note   Patient Name: [name] [name]       Date: 06/05/2024 DOB: [birth year]  Age: 64 y.o. MRN#: [id number] Attending Physician: Caleen Qualia, MD Primary Care Physician: Liana Fish, NP Admit Date: 05/31/2024  Reason for Consultation/Follow-up: Establishing goals of care  HPI/Brief Hospital Review: 64 y.o. 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  Patient developed sudden onset chest discomfort and shortness of breath.    Admitted and being treated for NSTEMI, possible new onset A. Fib, calciphylaxis with nonhealing ulcer, ESRD on PD   PMT transitioned to CMO 1/8--wife interested in hospice IPU   Following up today to continue goals of care conversations and coordination of care.  Subjective: Chart reviewed: Progress Notes:Primary provider, TOC and nursing notes from previous day  Update received from Saddie Na, RN-hospice liaison, wife has now expressed interest in going home with hospice following  Visited with [name] [name] at his bedside. He is awake, alert and sitting up on side of bed. He is pleasantly confused, unable to answer questions appropriately or engage in GOC conversations. Reports pain well managed at present time, no acute complaints. Wife at bedside.  [name] [name] confirms  her interest in discharging home with hospice services. She is aware of DME that can be provided by hospice--shares she feels she will be in need of purchasing a recliner as [name] [name] is most comfortable in a chair position. [name] [name] speaks to having assistance in caring for [name] [name] at home by her sister or hiring private care aids to assist.  We discussed symptom management. Discussed a regimen utilizing PO medications to maintain symptoms to be discharged home with--[name] [name] verbalizes understanding and agreement.  Orders placed for increased frequency of alprazolam  and oxycodone  with addition of PO liquid hydromorphone  for severe pain. IV medications discontinued in preparation for discharge home.  Answered and addressed all questions and concerns. PMT to  continue to follow for ongoing needs and support.  Objective:  Physical Exam Constitutional:      General: He is not in acute distress.    Appearance: He is ill-appearing.  Pulmonary:     Effort: Pulmonary effort is normal. No respiratory distress.  Skin:    Findings: Bruising present.     Comments: Multiple wounds noted  Neurological:     Mental Status: He is alert. He is disoriented.     Motor: Weakness present.             Vital Signs: BP 121/62 (BP Location: Right Arm)   Pulse (!) 50   Temp 98.6 F (37 C)   Resp 17   Ht 6' 1 (1.854 m)   Wt 71.8 kg   SpO2 (!) 87%   BMI 20.88 kg/m  SpO2: SpO2: (!) 87 % O2 Device: O2 Device: Room Air O2 Flow Rate: O2 Flow Rate (L/min): 2 L/min   Palliative Care Assessment & Plan   Assessment/Recommendation/Plan  Medications adjusted to prepare for possible home with hospice  Care plan was discussed with primary team, TOC and hospice liaison  Thank you for allowing the Palliative Medicine Team to assist in the care of this patient.  I personally spent a total of 50 minutes in the care of the patient today including preparing to see the patient, getting/reviewing separately  obtained history, performing a medically appropriate exam/evaluation, counseling and educating, placing orders, referring and communicating with other health care professionals, documenting clinical information in the EHR, and coordinating care.   Waddell Lesches, DNP, AGNP-C Palliative Medicine   Please contact Palliative Medicine Team phone at [phone] for questions and concerns.   "

## 2024-06-05 NOTE — Progress Notes (Signed)
 [facility] Liaison Note  Follow up visit to bedside to discuss discharge plan/location further with patient's wife, [name] [name].  This RN had spoken with her via phone earlier in the morning, before patient arrived at the hospital for a visit.  Patient was sleeping upon arrival.  There were 2 friends at the bedside visiting- who had arrived from [place]  to see patient and his wife.  After a long discussion, [name] [name] really wants to try to take her husband home.  Patient really wants to go home.  Patient was weaning off IV meds today to po meds for anxiety.  Plan on meds to beds prior to discharge.  DME needs:  hospital bed, full bedrails x 2, over bed table. DME in home:  wheelchair, [facility]  [name] [name] will need to hire 24/7 caregivers prior to discharge.  She will also need to find help to move furniture for DME equipment.  Hospital medical team and Claretta Shove CM notified of above.  Please call with any hospice needs or concerns.  [name] HILARIO Na, RN Nurse Liaison [phone]

## 2024-06-05 NOTE — Progress Notes (Signed)
 "  Rounding Note   Patient Name: [name] [name] Date of Encounter: 06/05/2024  [facility] Cardiologist: Evalene Lunger, MD   Subjective Patient doing well this morning. Family at the bedside. Remains in rate controlled atrial flutter. Denies chest pain.   Scheduled Meds:  apixaban   5 mg Oral BID   carvedilol   3.125 mg Oral BID WC   Chlorhexidine  Gluconate Cloth  6 each Topical Daily   dextromethorphan -guaiFENesin   1 tablet Oral BID   ezetimibe   10 mg Oral Daily   gentamicin  cream  1 Application Topical Daily   hydrALAZINE   100 mg Oral BID   insulin  aspart  0-15 Units Subcutaneous Q4H   isosorbide  mononitrate  30 mg Oral Daily   sodium chloride  flush  10-40 mL Intracatheter Q12H   torsemide   20 mg Oral Daily   Continuous Infusions:  sodium thiosulfate  25 g in sodium chloride  0.9 % 200 mL Infusion for Calciphylaxis 200 mL/hr at 06/04/24 1108   PRN Meds: acetaminophen , albuterol , ALPRAZolam , HYDROmorphone  (DILAUDID ) injection, LORazepam , nitroGLYCERIN , ondansetron  (ZOFRAN ) IV, oxyCODONE , polyethylene glycol   Vital Signs  Vitals:   06/04/24 0824 06/04/24 1954 06/04/24 2110 06/05/24 0752  BP: (!) 140/97 113/62 113/62 121/62  Pulse: 93   (!) 50  Resp: 19 18  17   Temp: 98.3 F (36.8 C) 97.8 F (36.6 C)  98.6 F (37 C)  TempSrc:      SpO2: 98% 99%  (!) 87%  Weight:      Height:        Intake/Output Summary (Last 24 hours) at 06/05/2024 1018 Last data filed at 06/05/2024 0100 Gross per 24 hour  Intake 201.29 ml  Output 600 ml  Net -398.71 ml      06/03/2024    5:00 AM 06/02/2024    4:51 AM 05/31/2024    6:12 PM  Last 3 Weights  Weight (lbs) 158 lb 4.6 oz 151 lb 14 oz 152 lb  Weight (kg) 71.8 kg 68.89 kg 68.947 kg      Telemetry Atrial flutter, rate 60s-80s - Personally Reviewed  Physical Exam  GEN: No acute distress.   Neck: No JVD Cardiac: Irregular, no murmurs, rubs, or gallops.  Respiratory: Clear to auscultation bilaterally. GI: Soft, nontender,  non-distended  MS: No edema; No deformity. Neuro:  Nonfocal  Psych: Normal affect   Labs High Sensitivity Troponin:  No results for input(s): TROPONINIHS in the last 720 hours.  Recent Labs  Lab 05/31/24 1900 05/31/24 1956 06/01/24 0934 06/01/24 1850  TRNPT 639* 689* 674* 643*       Chemistry Recent Labs  Lab 05/31/24 1900 06/02/24 0412 06/03/24 0359 06/05/24 0447  NA 129*  --   --  148*  K 3.7  --  3.3* 4.1  CL 87*  --   --  90*  CO2 23  --   --  17*  GLUCOSE 178*  --   --  126*  BUN 53*  --   --  66*  CREATININE 6.60*  --   --  7.40*  CALCIUM  9.0  --   --  10.1  MG 2.1  --  2.3  --   PROT  --  6.5  --   --   ALBUMIN   --  3.1*  --   --   AST  --  29  --   --   ALT  --  15  --   --   ALKPHOS  --  124  --   --  BILITOT  --  0.3  --   --   GFRNONAA 9*  --   --  8*  ANIONGAP 20*  --   --  40*    Lipids No results for input(s): CHOL, TRIG, HDL, LABVLDL, LDLCALC, CHOLHDL in the last 168 hours.  Hematology Recent Labs  Lab 06/01/24 0322 06/02/24 0412 06/03/24 0359  WBC 11.2* 13.0* 12.3*  RBC 3.63* 3.98* 3.67*  HGB 11.6* 12.5* 11.6*  HCT 33.7* 37.5* 33.0*  MCV 92.8 94.2 89.9  MCH 32.0 31.4 31.6  MCHC 34.4 33.3 35.2  RDW 14.9 15.2 14.9  PLT 225 224 240   Thyroid   Recent Labs  Lab 06/01/24 0934  TSH 1.130    BNP Recent Labs  Lab 05/31/24 1900  PROBNP >35,000.0*    DDimer No results for input(s): DDIMER in the last 168 hours.   Radiology  No results found.  Cardiac Studies  06/02/2023 2D echo 1. Left ventricular ejection fraction, by estimation, is 20 to 25%. The  left ventricle has severely decreased function. The left ventricle  demonstrates global hypokinesis. The left ventricular internal cavity size  was mildly dilated. Left ventricular  diastolic parameters are consistent with Grade II diastolic dysfunction  (pseudonormalization). Elevated left atrial pressure.   2. Right ventricular systolic function is severely reduced.  The right  ventricular size is normal.   3. Left atrial size was mildly dilated.   4. The mitral valve is degenerative. Mild mitral valve regurgitation. No  evidence of mitral stenosis. Moderate to severe mitral annular  calcification.   5. Tricuspid valve regurgitation is mild to moderate.   6. The aortic valve is tricuspid. There is moderate calcification of the  aortic valve. There is severe thickening of the aortic valve. Aortic valve  regurgitation is not visualized. Probably severe low-flow/low-gradient  aortic valve stenosis (mean  gradient 14 mmHg, AVA 0.91 cm^2, DI 0.24).   7. The inferior vena cava is dilated in size with <50% respiratory  variability, suggesting right atrial pressure of 15 mmHg.   8. Cannot exclude a small PFO.   Patient Profile   64 y.o. 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   Assessment & Plan   New onset atrial flutter - First noted during this admission on 1/5 - Appears grossly asymptomatic - EF reduced from prior at 20-25% - Not a good candidate for TEE cardioversion given encephalopathy - CHA2DS2-VASc at least 5 (CHF, HTN, age x 1, DM, vascular disease)  - Transitioned to Eliquis  5 mg twice daily yesterday. Will need to reassess if this is appropriate long term prior to discharge given history of frequent falls.  - Remains in atrial flutter with rate well controlled at 60s-80s bpm - Continue carvedilol  3.125 mg twice daily  Coronary artery disease Elevated troponin - Troponin mildly elevated and flat trending, peaked at 689 - Unclear if this was reflective of supply/demand in the setting  of atrial flutter and ESRD vs NSTEMI - Echo this admission with EF down from prior, now 20-25% - Patient is not a candidate for invasive ischemic evaluation in the setting of encephalopathy and ESRD - Completed 48 hours of heparin  - Continue Imdur  and ezetimibe . Eliquis   in place of ASA.   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

## 2024-06-05 NOTE — Plan of Care (Signed)
" °  Problem: Coping: Goal: Ability to adjust to condition or change in health will improve Outcome: Progressing   Problem: Health Behavior/Discharge Planning: Goal: Ability to identify and utilize available resources and services will improve Outcome: Progressing Goal: Ability to manage health-related needs will improve Outcome: Progressing   Problem: Skin Integrity: Goal: Risk for impaired skin integrity will decrease Outcome: Progressing   Problem: Cardiac: Goal: Ability to achieve and maintain adequate cardiovascular perfusion will improve Outcome: Progressing   "

## 2024-06-05 NOTE — Plan of Care (Signed)
  Problem: Education: Goal: Ability to describe self-care measures that may prevent or decrease complications (Diabetes Survival Skills Education) will improve Outcome: Progressing Goal: Individualized Educational Video(s) Outcome: Progressing   Problem: Coping: Goal: Ability to adjust to condition or change in health will improve Outcome: Progressing   Problem: Fluid Volume: Goal: Ability to maintain a balanced intake and output will improve Outcome: Progressing   Problem: Health Behavior/Discharge Planning: Goal: Ability to identify and utilize available resources and services will improve Outcome: Progressing Goal: Ability to manage health-related needs will improve Outcome: Progressing   Problem: Metabolic: Goal: Ability to maintain appropriate glucose levels will improve Outcome: Progressing   Problem: Nutritional: Goal: Maintenance of adequate nutrition will improve Outcome: Progressing Goal: Progress toward achieving an optimal weight will improve Outcome: Progressing   Problem: Skin Integrity: Goal: Risk for impaired skin integrity will decrease Outcome: Progressing   Problem: Tissue Perfusion: Goal: Adequacy of tissue perfusion will improve Outcome: Progressing   Problem: Education: Goal: Understanding of cardiac disease, CV risk reduction, and recovery process will improve Outcome: Progressing Goal: Individualized Educational Video(s) Outcome: Progressing   Problem: Activity: Goal: Ability to tolerate increased activity will improve Outcome: Progressing   Problem: Cardiac: Goal: Ability to achieve and maintain adequate cardiovascular perfusion will improve Outcome: Progressing   Problem: Health Behavior/Discharge Planning: Goal: Ability to safely manage health-related needs after discharge will improve Outcome: Progressing   Problem: Education: Goal: Knowledge of General Education information will improve Description: Including pain rating scale,  medication(s)/side effects and non-pharmacologic comfort measures Outcome: Progressing   Problem: Health Behavior/Discharge Planning: Goal: Ability to manage health-related needs will improve Outcome: Progressing   Problem: Clinical Measurements: Goal: Ability to maintain clinical measurements within normal limits will improve Outcome: Progressing Goal: Will remain free from infection Outcome: Progressing Goal: Diagnostic test results will improve Outcome: Progressing Goal: Respiratory complications will improve Outcome: Progressing Goal: Cardiovascular complication will be avoided Outcome: Progressing   Problem: Activity: Goal: Risk for activity intolerance will decrease Outcome: Progressing   Problem: Nutrition: Goal: Adequate nutrition will be maintained Outcome: Progressing   Problem: Coping: Goal: Level of anxiety will decrease Outcome: Progressing   Problem: Elimination: Goal: Will not experience complications related to bowel motility Outcome: Progressing Goal: Will not experience complications related to urinary retention Outcome: Progressing   Problem: Pain Managment: Goal: General experience of comfort will improve and/or be controlled Outcome: Progressing   Problem: Safety: Goal: Ability to remain free from injury will improve Outcome: Progressing   Problem: Skin Integrity: Goal: Risk for impaired skin integrity will decrease Outcome: Progressing

## 2024-06-05 NOTE — TOC Progression Note (Signed)
 Transition of Care [facility]) - Progression Note    Patient Details  Name: [name] [name] MRN: [id number] Date of Birth: 1960/08/29  [facility] CM/SW Contact  Victory Jackquline RAMAN, RN Phone Number: 06/05/2024, 2:35 PM  Clinical Narrative:    RNCM received a message via secure chat from Kara with [facility] informing me that the patient will be going home with Hospice once DME is delivered. RNCM will continue to follow for discharge planning needs.   Expected Discharge Plan: Home w Home Health Services Barriers to Discharge: Continued Medical Work up               Expected Discharge Plan and Services     Post Acute Care Choice: Resumption of Svcs/PTA Provider Living arrangements for the past 2 months: Single Family Home                           HH Arranged: RN [facility] Agency: [facility] Date [facility] Agency Contacted: 06/02/24   Representative spoke with at [facility] Agency: Darleene   Social Drivers of Health (SDOH) Interventions SDOH Screenings   Food Insecurity: No Food Insecurity (06/03/2024)  Housing: Low Risk (06/03/2024)  Transportation Needs: No Transportation Needs (06/03/2024)  Utilities: Not At Risk (06/03/2024)  Depression (PHQ2-9): Low Risk (01/22/2023)  Financial Resource Strain: Low Risk  (10/30/2023)   Received from [facility]  Physical Activity: Unknown (09/09/2021)   Received from [facility]  Social Connections: Unknown (06/03/2024)  Stress: No Stress Concern Present (02/23/2023)  Tobacco Use: High Risk (05/31/2024)  Health Literacy: Adequate Health Literacy (02/23/2023)    Readmission Risk Interventions    06/02/2024   10:24 AM  Readmission Risk Prevention Plan  Transportation Screening Complete  PCP or Specialist Appt within 3-5 Days Complete  HRI or Home Care Consult Complete  Social Work Consult for Recovery Care Planning/Counseling Complete  Palliative Care Screening Complete  Medication Review Oceanographer) Complete

## 2024-06-06 DIAGNOSIS — K6289 Other specified diseases of anus and rectum: Secondary | ICD-10-CM | POA: Diagnosis not present

## 2024-06-06 DIAGNOSIS — Z515 Encounter for palliative care: Secondary | ICD-10-CM | POA: Diagnosis not present

## 2024-06-06 DIAGNOSIS — E43 Unspecified severe protein-calorie malnutrition: Secondary | ICD-10-CM | POA: Diagnosis not present

## 2024-06-06 DIAGNOSIS — N186 End stage renal disease: Secondary | ICD-10-CM | POA: Diagnosis not present

## 2024-06-06 DIAGNOSIS — I214 Non-ST elevation (NSTEMI) myocardial infarction: Secondary | ICD-10-CM | POA: Diagnosis not present

## 2024-06-06 DIAGNOSIS — Z7189 Other specified counseling: Secondary | ICD-10-CM

## 2024-06-06 LAB — GLUCOSE, CAPILLARY
Glucose-Capillary: 188 mg/dL — ABNORMAL HIGH (ref 70–99)
Glucose-Capillary: 149 mg/dL — ABNORMAL HIGH (ref 70–99)
Glucose-Capillary: 150 mg/dL — ABNORMAL HIGH (ref 70–99)
Glucose-Capillary: 230 mg/dL — ABNORMAL HIGH (ref 70–99)
Glucose-Capillary: 148 mg/dL — ABNORMAL HIGH (ref 70–99)

## 2024-06-06 MED ORDER — LIDOCAINE HCL URETHRAL/MUCOSAL 2 % EX GEL
1.0000 | Freq: Four times a day (QID) | CUTANEOUS | Status: DC | PRN
  Administered 2024-06-06: 1 via TOPICAL
  Filled 2024-06-06 (×2): qty 5

## 2024-06-06 NOTE — Progress Notes (Signed)
 "  Rounding Note   Patient Name: [name] [name] Date of Encounter: 06/06/2024  [facility] Cardiologist: Evalene Lunger, MD   Subjective Patient's family decided yesterday that he will be discharged with home hospice. No longer on telemetry.   Scheduled Meds:  apixaban   5 mg Oral BID   carvedilol   3.125 mg Oral BID WC   Chlorhexidine  Gluconate Cloth  6 each Topical Daily   dextromethorphan -guaiFENesin   1 tablet Oral BID   ezetimibe   10 mg Oral Daily   gentamicin  cream  1 Application Topical Daily   hydrALAZINE   100 mg Oral BID   insulin  aspart  0-15 Units Subcutaneous Q4H   isosorbide  mononitrate  30 mg Oral Daily   sodium chloride  flush  10-40 mL Intracatheter Q12H   torsemide   20 mg Oral Daily   Continuous Infusions:  sodium thiosulfate  25 g in sodium chloride  0.9 % 200 mL Infusion for Calciphylaxis 200 mL/hr at 06/04/24 1108   PRN Meds: acetaminophen , albuterol , ALPRAZolam , HYDROmorphone  HCl, nitroGLYCERIN , ondansetron  (ZOFRAN ) IV, oxyCODONE , polyethylene glycol   Vital Signs  Vitals:   06/05/24 0752 06/05/24 1952 06/05/24 1955 06/06/24 0359  BP: 121/62 114/70 114/70 105/71  Pulse: (!) 50  66 68  Resp: 17  17 19   Temp: 98.6 F (37 C) (!) 97.4 F (36.3 C) (!) 97.4 F (36.3 C) (!) 97.5 F (36.4 C)  TempSrc:   Oral   SpO2: (!) 87%  97% 93%  Weight:      Height:        Intake/Output Summary (Last 24 hours) at 06/06/2024 0843 Last data filed at 06/06/2024 0100 Gross per 24 hour  Intake 250 ml  Output 850 ml  Net -600 ml      06/03/2024    5:00 AM 06/02/2024    4:51 AM 05/31/2024    6:12 PM  Last 3 Weights  Weight (lbs) 158 lb 4.6 oz 151 lb 14 oz 152 lb  Weight (kg) 71.8 kg 68.89 kg 68.947 kg      Telemetry Atrial flutter, rate 60s-80s - Personally Reviewed  Physical Exam  GEN: No acute distress.   Neck: No JVD Cardiac: Irregular, no murmurs, rubs, or gallops.  Respiratory: Clear to auscultation bilaterally. GI: Soft, nontender, non-distended   MS: No edema; No deformity. Neuro:  Nonfocal  Psych: Normal affect   Labs High Sensitivity Troponin:  No results for input(s): TROPONINIHS in the last 720 hours.  Recent Labs  Lab 05/31/24 1900 05/31/24 1956 06/01/24 0934 06/01/24 1850  TRNPT 639* 689* 674* 643*       Chemistry Recent Labs  Lab 05/31/24 1900 06/02/24 0412 06/03/24 0359 06/05/24 0447  NA 129*  --   --  148*  K 3.7  --  3.3* 4.1  CL 87*  --   --  90*  CO2 23  --   --  17*  GLUCOSE 178*  --   --  126*  BUN 53*  --   --  66*  CREATININE 6.60*  --   --  7.40*  CALCIUM  9.0  --   --  10.1  MG 2.1  --  2.3  --   PROT  --  6.5  --   --   ALBUMIN   --  3.1*  --   --   AST  --  29  --   --   ALT  --  15  --   --   ALKPHOS  --  124  --   --  BILITOT  --  0.3  --   --   GFRNONAA 9*  --   --  8*  ANIONGAP 20*  --   --  40*    Lipids No results for input(s): CHOL, TRIG, HDL, LABVLDL, LDLCALC, CHOLHDL in the last 168 hours.  Hematology Recent Labs  Lab 06/01/24 0322 06/02/24 0412 06/03/24 0359  WBC 11.2* 13.0* 12.3*  RBC 3.63* 3.98* 3.67*  HGB 11.6* 12.5* 11.6*  HCT 33.7* 37.5* 33.0*  MCV 92.8 94.2 89.9  MCH 32.0 31.4 31.6  MCHC 34.4 33.3 35.2  RDW 14.9 15.2 14.9  PLT 225 224 240   Thyroid   Recent Labs  Lab 06/01/24 0934  TSH 1.130    BNP Recent Labs  Lab 05/31/24 1900  PROBNP >35,000.0*    DDimer No results for input(s): DDIMER in the last 168 hours.   Radiology  No results found.  Cardiac Studies  06/02/2023 2D echo 1. Left ventricular ejection fraction, by estimation, is 20 to 25%. The  left ventricle has severely decreased function. The left ventricle  demonstrates global hypokinesis. The left ventricular internal cavity size  was mildly dilated. Left ventricular  diastolic parameters are consistent with Grade II diastolic dysfunction  (pseudonormalization). Elevated left atrial pressure.   2. Right ventricular systolic function is severely reduced. The right   ventricular size is normal.   3. Left atrial size was mildly dilated.   4. The mitral valve is degenerative. Mild mitral valve regurgitation. No  evidence of mitral stenosis. Moderate to severe mitral annular  calcification.   5. Tricuspid valve regurgitation is mild to moderate.   6. The aortic valve is tricuspid. There is moderate calcification of the  aortic valve. There is severe thickening of the aortic valve. Aortic valve  regurgitation is not visualized. Probably severe low-flow/low-gradient  aortic valve stenosis (mean  gradient 14 mmHg, AVA 0.91 cm^2, DI 0.24).   7. The inferior vena cava is dilated in size with <50% respiratory  variability, suggesting right atrial pressure of 15 mmHg.   8. Cannot exclude a small PFO.   Patient Profile   64 y.o. 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   Assessment & Plan   New onset atrial flutter - First noted during this admission on 1/5 - Appears grossly asymptomatic - EF reduced from prior at 20-25% - Not a good candidate for TEE cardioversion given encephalopathy - CHA2DS2-VASc at least 5 (CHF, HTN, age x 1, DM, vascular disease)  - Transitioned to Eliquis  5 mg twice daily. Will need to reassess if this is appropriate long term prior to discharge given history of frequent falls.  - Remains in atrial flutter with rate well controlled at 60s-80s bpm - Continue carvedilol  3.125 mg twice daily  Coronary artery disease Elevated troponin - Troponin mildly elevated and flat trending, peaked at 689 - Unclear if this was reflective of supply/demand in the setting of atrial flutter and  ESRD vs NSTEMI - Echo this admission with EF down from prior, now 20-25% - Patient is not a candidate for invasive ischemic evaluation in the setting of encephalopathy and ESRD - Completed 48 hours of heparin  - Continue Imdur  and ezetimibe . Eliquis  in  place of ASA.   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

## 2024-06-06 NOTE — Progress Notes (Signed)
 " Progress Note   Patient: [name] [name] FMW:[id number] DOB: 12/20/1960 DOA: 05/31/2024     5 DOS: the patient was seen and examined on 06/06/2024   Brief hospital course: Partly taken from H&P.  [name] [name] is a 64 y.o. 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  Patient developed sudden onset chest discomfort and shortness of breath.  With EMS patient was in A-fib.  On presentation mild tachycardia at 105, otherwise stable vital.  Labs notable for troponin of 639, proBNP > 35,000, leukocytosis at 11.9.  Hyponatremia with sodium at 129, potassium 3.4 and magnesium of 2.1 EKG with atrial flutter, nonspecific ST-T wave changes. CXR was negative for any acute abnormality.  Patient was given aspirin  and started on heparin  infusion.  Cardiology was consulted.  1/6: Vital stable, troponin 639>> 689, small improvement in leukocytosis to 11.2, A1c of 7.5 Echocardiogram with worsening EF now at 20 to 25%, grade 2 diastolic dysfunction, global hypokinesis, also found to have severely reduced right ventricular systolic function, probably severe aortic stenosis. Unable to urinate-did one-time In-N-Out catheter with removal of 1100 mL of urine, if he continue with unable to void-might need a Foley catheter. Cardiology is on board.  Per wife patient with declining overall health and functional status, refusing to eat and losing weight. Palliative care was consulted to discuss goals of care.  1/7: Patient more alert.  Wife  would like to avoid Xanax .  She wants placement as getting difficult for her to take care of him.  PT and OT ordered. He was given sodium Theophylline at for calciphylaxis by nephrology today.  Will complete the course of antibiotic for his prior infection.  Cardiology stopped his amlodipine  and switch to Imdur  and hydralazine  due to decrease in EF.  1/8: Patient again restless and confused.  Getting PD during rounds.  Palliative care met with family and they decided to transition him to comfort care.  They are interested in hospice facility.  Hospice was consulted.  1/9: Patient with intermittent restlessness and remained confused.  Hospice is monitoring his symptom burden for another day or 2 before deciding about taking him to hospice facility.  Wife is very overwhelmed stating that she cannot take care of him at this point.  1/10: Patient currently stable, wife now wants him to be home with hospice.  Need to have equipment delivered before discharge.  1/11: Patient with some worsening buttock pain, wife was concerned that he had incision and drainage of his prior abscess and it is reoccurring.  Was evaluated by general surgery again, no drainable abscess.  Likely pressure injuries. Likely will go home with hospice tomorrow after equipment delivery.  Need to avoid IV medications as wife wants to see how he does with p.o. only.  Assessment and Plan: * NSTEMI (non-ST elevated myocardial infarction) (HCC) CAD with history of four-vessel CABG Patient with episode of shortness of breath probable chest discomfort, nonspecific EKG, troponin peaked at 689, can be due to demand ischemia.  Not a candidate for cardiac cath. Received aspirin  in the ED Completed 48  hours of heparin  Continue aspirin , carvedilol , nitroglycerin  sublingual as needed chest pain, Zetia  and Repatha  Cardiology is on board,  Echocardiogram with for the worsening of EF not to 20 to 25%, global hypokinesis. - Patient is now  comfort care and will be going home with hospice tomorrow  Possible New onset atrial fibrillation (HCC) EKG with concern of new onset atrial fibrillation/flutter, CHA2DS2-VASc at least 5 - Likely not a good candidate for DCCV but cardiology will decide -Continue to monitor -Continue with Coreg  - Cardiology started on Eliquis -patient will remain high risk due to poor her underlying functional status.  Calciphylaxis with multiple nonhealing ulcer, with fat layer exposed (HCC) Patient with mild multiple wounds, followed at wound care center Continue pain control Continue wound care  ESRD on peritoneal dialysis [facility]) Nephrology consulted for continuation of dialysis- -PD dialysis is currently being held as family decided to go with comfort care. Patient will be going home with hospice tomorrow after equipment delivery  Discitis of cervical region On current long-term antibiotics for discitis treatment  Continue ceftriaxone , daptomycin  and fluconazole  via PICC line - Completed a course of antibiotic now  Chronic HFrEF (heart failure with reduced ejection fraction) (HCC) Clinically euvolemic, repeat echocardiogram with further decrease of EF to 20 to 25%. Continue GDMT  Essential hypertension Home amlodipine  was switched with Imdur  and hydralazine  due to further decrease in EF.  COPD (chronic obstructive pulmonary disease) (HCC) Not acutely exacerbated Continue home inhalers with DuoNebs as needed  Depression with anxiety Continue home alprazolam  and nortriptyline   Type 2 diabetes mellitus with diabetic polyneuropathy, with long-term current use of insulin  (HCC) Continue basal insulin  with sliding scale coverage  Chronic pain syndrome Continue as needed oxycodone    Subjective: Patient was complaining of some buttock pain.  Wife was concerned that he had incision and drainage of an abscess before and likely developing another 1 and requesting surgical reevaluation which was  done.  She also want to use only p.o. pain medications to see if that be a possibility as he will be going home with hospice.  Physical Exam: Vitals:   06/05/24 1952 06/05/24 1955 06/06/24 0359 06/06/24 0849  BP: 114/70 114/70 105/71 128/64  Pulse:  66 68 67  Resp:  17 19 18   Temp: (!) 97.4 F (36.3 C) (!) 97.4 F (36.3 C) (!) 97.5 F (36.4 C) 98 F (36.7 C)  TempSrc:  Oral    SpO2:  97% 93% 99%  Weight:      Height:       General. Frail and malnourished gentleman, in no acute distress.  Appears much older than stated age. Pulmonary.  Lungs clear bilaterally, normal respiratory effort. CV.  Regular rate and rhythm, no JVD, rub or murmur. Abdomen.  Soft, nontender, nondistended, BS positive. CNS.  Alert and oriented .  No focal neurologic deficit. Extremities.  No edema, multiple wounds and bandages Psychiatry.  Judgment and insight appears normal.    Data Reviewed: Prior data reviewed  Family Communication: Discussed with wife at bedside, other family members were also present.  Disposition: Status is: Inpatient Remains inpatient appropriate because: Severity of illness  Planned Discharge Destination: Hospice facility  DVT prophylaxis.  Heparin  infusion Time spent: 50 minutes  This record has been created using Conservation officer, historic buildings. Errors have been sought and corrected,but may not always be located. Such creation errors do not reflect on the standard of care.   Author: Amaryllis Dare, MD 06/06/2024 2:55 PM  For on call review [email or web address].  "

## 2024-06-06 NOTE — Plan of Care (Signed)
" °  Problem: Fluid Volume: Goal: Ability to maintain a balanced intake and output will improve Outcome: Progressing   Problem: Activity: Goal: Ability to tolerate increased activity will improve Outcome: Not Progressing   Problem: Elimination: Goal: Will not experience complications related to bowel motility Outcome: Progressing Goal: Will not experience complications related to urinary retention Outcome: Progressing   Problem: Pain Managment: Goal: General experience of comfort will improve and/or be controlled Outcome: Progressing   "

## 2024-06-06 NOTE — Consult Note (Signed)
 Patient ID: [name] [name], male   DOB: Dec 21, 1960, 64 y.o.   MRN: [id number] CC: Possible Perirectal abscess History of Present Illness [name] [name] is a 64 y.o. 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  The patient is known to me he has back in October he he came into my office with a perirectal abscess that I was able to drain at the bedside.  Since October it appears that he has unfortunately deteriorated significantly in health status.  He was admitted this time to the hospital for a flutter and it was decided that he would be discharged in hospice care.  He reports continued pain on [name] buttocks and I was consulted to determine if there was a perirectal abscess..  Past Medical History Past Medical History:  Diagnosis Date   Anemia    Arthritis    CAD (coronary artery disease)    a. 12/2016 s/p CABG x 4 (LIMA->LAD, VG->RCA, VG->OM1, VG->D1); b. 02/2018 MV: EF 35%, small-med inferolaterlal infarct. No ischemia.   CKD (chronic kidney disease), stage IV [facility])    Peritoneal Dialysis   Colon polyps    COPD (chronic obstructive pulmonary disease) (HCC)    Dupre's syndrome    GERD (gastroesophageal reflux disease)    HFrEF (heart failure reduced ejection fraction) (HCC)    a.) 2018 EF 35%; b.) 02/2018 EF 50%; c). 02/2019 EF 40-45%; d.) 08/2019 Echo: EF 50-55%, no rwma, GrII DD; e.) 04/2020 Echo: EF 30-35%, mod-sev glob HK. Mild LVH. G2DD. Low-nl RV fxn. Mildly dil LA. Mild MR. Mild-mod Ao sclerosis w/o stenosis; f.) TTE 02/09/2021: EF 55%; septal HK, LAE; G2DD.   History of 2019 novel coronavirus disease (COVID-19) 02/08/2021   Hyperlipidemia    Hypertension    Ischemic cardiomyopathy    a.) 02/2018 MV: EF 35%; b.) 08/2019 Echo: EF 50-55%; c.) 04/2020 Echo: EF 30-35%; d.) TTE 02/09/2021: EF 50-55%.   Lymphedema    Migraine     Myocardial infarction (HCC)    Neuropathy    PAD (peripheral artery disease)    a. 04/2017 Aortoiliac duplex: R iliac dzs; b. 03/2020 LE Duplex: mild bilat atherosclerosis throughout. Patent vessels.   Pneumonia    Retinopathy    S/P CABG x 4    a.)  4v CABG: LIMA->LAD, SVG->RCA, SVG->OM1, SVG->D1   Sleep apnea    a.) does not require nocturnal PAP therapy   Statin intolerance    Stomach ulcer    T2DM (type 2 diabetes mellitus) (HCC)        Past Surgical History:  Procedure Laterality Date   bone graft surgery Bilateral    on both feet x 2   CAPD INSERTION N/A 11/13/2021   Procedure: LAPAROSCOPIC INSERTION CONTINUOUS AMBULATORY PERITONEAL DIALYSIS  (CAPD) CATHETER;  Surgeon: Jordis Laneta FALCON, MD;  Location: [facility] ORS;  Service: General;  Laterality: N/A;   CAPD REVISION N/A 01/10/2022   Procedure: LAPAROSCOPIC REVISION CONTINUOUS AMBULATORY PERITONEAL DIALYSIS  (CAPD) CATHETER;  Surgeon: Jordis Laneta FALCON, MD;  Location: [facility] ORS;  Service: General;  Laterality: N/A;   CARDIAC CATHETERIZATION  2013   S/p PCI    CARDIAC CATHETERIZATION  2018   S/p CABG   CATARACT EXTRACTION Bilateral    COLONOSCOPY     COLONOSCOPY WITH PROPOFOL  N/A 10/17/2022   Procedure: COLONOSCOPY WITH PROPOFOL ;  Surgeon: Jinny Carmine, MD;  Location: [facility] END[facility];  Service:  Endoscopy;  Laterality: N/A;   CORONARY ARTERY BYPASS GRAFT  2018   (LIMA-LAD,VG-RCA,VG-OM1,VG-D1)   ESOPHAGOGASTRODUODENOSCOPY (EGD) WITH PROPOFOL  N/A 04/04/2020   Procedure: ESOPHAGOGASTRODUODENOSCOPY (EGD) WITH PROPOFOL ;  Surgeon: Jinny Carmine, MD;  Location: [facility];  Service: Endoscopy;  Laterality: N/A;   ESOPHAGOGASTRODUODENOSCOPY (EGD) WITH PROPOFOL  N/A 06/06/2020   Procedure: ESOPHAGOGASTRODUODENOSCOPY (EGD) WITH PROPOFOL ;  Surgeon: Jinny Carmine, MD;  Location: [facility];  Service: Endoscopy;  Laterality: N/A;   ESOPHAGOGASTRODUODENOSCOPY (EGD) WITH PROPOFOL  N/A 09/24/2022   Procedure: ESOPHAGOGASTRODUODENOSCOPY (EGD)  WITH PROPOFOL ;  Surgeon: Jinny Carmine, MD;  Location: [facility];  Service: Endoscopy;  Laterality: N/A;   INCISION AND DRAINAGE OF WOUND Left 04/23/2024   Procedure: IRRIGATION AND DEBRIDEMENT WOUND;  Surgeon: Gust Molly, DO;  Location: [facility] ORS;  Service: Orthopedics;  Laterality: Left;  LEFT WRIST DEBRIDEMENT OF WOUND   PERIPHERAL VASCULAR CATHETERIZATION Right 10/28/2016   PTA/DEB Right SFA   WRIST SURGERY Left    cyst    Allergies[1]  Current Facility-Administered Medications  Medication Dose Route Frequency Provider Last Rate Last Admin   acetaminophen  (TYLENOL ) tablet 650 mg  650 mg Oral Q4H PRN Duncan, Hazel V, MD       albuterol  (PROVENTIL ) (2.5 MG/3ML) 0.083% nebulizer solution 2.5 mg  2.5 mg Inhalation Q6H PRN Duncan, Hazel V, MD       ALPRAZolam  (XANAX ) tablet 0.5 mg  0.5 mg Oral TID PRN Harris, Taylor S, NP   0.5 mg at 06/05/24 2241   apixaban  (ELIQUIS ) tablet 5 mg  5 mg Oral BID Gollan, Timothy J, MD   5 mg at 06/06/24 9092   carvedilol  (COREG ) tablet 3.125 mg  3.125 mg Oral BID WC Duncan, Hazel V, MD   3.125 mg at 06/06/24 0907   Chlorhexidine  Gluconate Cloth 2 % PADS 6 each  6 each Topical Daily Amin, Sumayya, MD   6 each at 06/06/24 0907   dextromethorphan -guaiFENesin  (MUCINEX  DM) 30-600 MG per 12 hr tablet 1 tablet  1 tablet Oral BID Amin, Sumayya, MD   1 tablet at 06/06/24 0907   ezetimibe  (ZETIA ) tablet 10 mg  10 mg Oral Daily Lorene Sinclair L, PA-C   10 mg at 06/06/24 9092   gentamicin  cream (GARAMYCIN ) 0.1 % 1 Application  1 Application Topical Daily Druscilla Bald, NP   1 Application at 06/06/24 9092   hydrALAZINE  (APRESOLINE ) tablet 100 mg  100 mg Oral BID Duncan, Hazel V, MD   100 mg at 06/06/24 9092   HYDROmorphone  HCl (DILAUDID ) liquid 0.5 mg  0.5 mg Oral Q2H PRN Harris, Taylor S, NP       insulin  aspart (novoLOG ) injection 0-15 Units  0-15 Units Subcutaneous Q4H Mansy, Jan A, MD   2 Units at 06/06/24 9093   isosorbide  mononitrate (IMDUR ) 24 hr tablet 30  mg  30 mg Oral Daily Gollan, Timothy J, MD   30 mg at 06/06/24 9092   nitroGLYCERIN  (NITROSTAT ) SL tablet 0.4 mg  0.4 mg Sublingual Q5 min PRN Cleatus Delayne GAILS, MD       ondansetron  (ZOFRAN ) injection 4 mg  4 mg Intravenous Q6H PRN Duncan, Hazel V, MD   4 mg at 06/04/24 1503   oxyCODONE  (Oxy IR/ROXICODONE ) immediate release tablet 5 mg  5 mg Oral Q4H PRN Harris, Taylor S, NP   5 mg at 06/06/24 1119   polyethylene glycol (MIRALAX  / GLYCOLAX ) packet 17 g  17 g Oral Daily PRN Amin, Sumayya, MD   17 g at 06/06/24 0909   sodium chloride  flush (  NS) 0.9 % injection 10-40 mL  10-40 mL Intracatheter Q12H Caleen Qualia, MD   10 mL at 06/06/24 0908   sodium thiosulfate  25 g in sodium chloride  0.9 % 200 mL Infusion for Calciphylaxis  25 g Intravenous Q M,W,F Breeze, Faith, NP 200 mL/hr at 06/04/24 1108 Infusion Verify at 06/04/24 1108   torsemide  (DEMADEX ) tablet 20 mg  20 mg Oral Daily End, Christopher, MD   20 mg at 06/06/24 0907    Family History Family History  Problem Relation Age of Onset   Diabetes Mother    Heart disease Father        Social History Social History[2]      ROS Full ROS of systems performed and is otherwise negative there than what is stated in the HPI  Physical Exam Blood pressure 128/64, pulse 67, temperature 98 F (36.7 C), resp. rate 18, height 6' 1 (1.854 m), weight 71.8 kg, SpO2 99%.  Somewhat confused but does answer questions when prompted, breathing easy on room air, perirectal and gluteal cleft exam performed in the presence of [name] [name] and a chaperone.  In [name] gluteal cleft there is some pressure erythema without any breakdown of the skin as of yet, there is no fluctuance to suggest abscess.  On rectal exam there is a well-healed previous incision and drainage site from October without any recurrent fluctuance. Data Reviewed Reviewed notes and patient is scheduled to be discharged on hospice care when supplies are set up  I have personally reviewed the  patient's imaging and medical records.    Assessment    Patient with gluteal cleft and perirectal pain.  I do not appreciate any undrained fluid collections or abscesses.  I think this is likely secondary to pressure pain from a developing pressure ulcers.  Recommend offloading of [name] sacrum and continued sacral heart.  He can also place PAD or pillow under the area to help with the pressure.  As far symptomatic relief can place lidocaine  patch around [name] sacrum to help with the pain.  No surgical intervention at this time and surgery will sign off.   A total of 47 minutes was spent reviewing the patient's chart, performing history and physical and discussing treatment options with the patient  Jayson MALVA Endow 06/06/2024, 11:47 AM     [1]  Allergies Allergen Reactions   Iodinated Contrast Media Other (See Comments)    Kidney disease  Kidney disease  Kidney disease  Kidney disease   Other Other (See Comments)    Pain  With joint stiffness     Statins Other (See Comments)    Pain  With joint stiffness   Pain  With joint stiffness  Other Reaction(s): Joint Pain   Pregabalin Other (See Comments)    Generalized aches and pains Generalized aches and pains Generalized aches and pains Generalized aches and pains Generalized aches and pains   Sacubitril-Valsartan Other (See Comments)    Hyperkalemia  Hyperkalemia Hyperkalemia  Hyperkalemia   Atorvastatin Other (See Comments)    Muscle aches Muscle aches   Pravastatin Other (See Comments)    Muscle aches Muscle aches   Rosuvastatin Other (See Comments)    Muscle Aches Other reaction(s): JOINT PAIN Other reaction(s): JOINT PAIN Muscle Aches   [2]  Social History Tobacco Use   Smoking status: Every Day    Current packs/day: 0.25    Average packs/day: 0.3 packs/day for 39.0 years (9.8 ttl pk-yrs)    Types: Cigarettes    Passive exposure: Past  Smokeless tobacco: Never  Vaping Use   Vaping status: Never Used   Substance Use Topics   Alcohol use: Not Currently    Comment: very rarely - wine   Drug use: Never

## 2024-06-06 NOTE — Progress Notes (Signed)
 RN to bedside to assess patient for family request for pain medication. Patient found to be sitting on edge of bed with wife at his side bed alarm off and bed no longer in lowest position, floor mats in place. RN educated wife about patient safety and concerns for patient fall, wife insists that alarm remain off and patient remain in current position. Stating she will not leave him.  Wife noted to have hand on patient throughout interaction and patient able to follow commands.

## 2024-06-06 NOTE — Progress Notes (Signed)
 "                                                                                                                                                                                                          Daily Progress Note   Patient Name: [name] [name]       Date: 06/06/2024 DOB: 12-Jul-1960  Age: 64 y.o. MRN#: [id number] Attending Physician: Caleen Qualia, MD Primary Care Physician: Liana Fish, NP Admit Date: 05/31/2024  Reason for Consultation/Follow-up: Establishing goals of care  HPI/Brief Hospital Review: 64 y.o. 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  Patient developed sudden onset chest discomfort and shortness of breath.    Admitted and being treated for NSTEMI, possible new onset A. Fib, calciphylaxis with nonhealing ulcer, ESRD on PD   PMT transitioned to CMO 1/8--wife interested in hospice IPU   Following up today to continue goals of care conversations and coordination of care.  Subjective: Chart reviewed: Progress Notes:Primary team, hospice liaison, cardiology, general surgery, TOC and nursing notes since last visit  Visited with Mr. Keehan at his bedside. He is awake, alert and sitting on side of bed. He is pleasantly disoriented and unable to participate in meaningful conversation. He is complaining of pain in his penis due to his catheter. Wife at bedside during time of visit.  Assessed catheter site, due to underwear and placement on anchoring device it does seem catheter is being  pulled--removed from anchoring device. Seems there is also irritation likely due from moisture. Recommended removal of mesh underwear, placement of loose fitting underwear. Spoke with nursing about placing new anchoring device and cleansing area as needed. Also recommended administration of pain medication due to increased pain.  Hampton shares her sister is in the process of moving furniture out of the home in preparation of DME delivery from hospice. Plan remains for Mr. Magoon to discharge home with hospice services following. TOC and hospice liaison actively assisting in discharge planning.  Review of MAR, symptoms continue to be well managed with current regimen. No adjustments to be made.  Answered and addressed all questions and concerns. PMT to continue  to follow for ongoing needs and support.  Objective:  Physical Exam Constitutional:      General: He is not in acute distress.    Appearance: He is ill-appearing.  Pulmonary:     Effort: Pulmonary effort is normal. No respiratory distress.  Skin:    General: Skin is warm and dry.     Findings: Bruising present.     Comments: Multiple wounds noted  Neurological:     Mental Status: He is alert. He is disoriented.     Motor: Weakness present.  Psychiatric:        Mood and Affect: Mood normal.        Behavior: Behavior normal.             Vital Signs: BP 128/64 (BP Location: Right Arm)   Pulse 67   Temp 98 F (36.7 C)   Resp 18   Ht 6' 1 (1.854 m)   Wt 71.8 kg   SpO2 99%   BMI 20.88 kg/m  SpO2: SpO2: 99 % O2 Device: O2 Device: Room Air O2 Flow Rate: O2 Flow Rate (L/min): 2 L/min   Palliative Care Assessment & Plan   Assessment/Recommendation/Plan  Comfort remains, pain well managed with current regimen Plan to d/c home with hospice once equipment delivered  Care plan was discussed with nursing staff  Thank you for allowing the Palliative Medicine Team to assist in the care of this patient.  I personally  spent a total of 50 minutes in the care of the patient today including preparing to see the patient, getting/reviewing separately obtained history, performing a medically appropriate exam/evaluation, counseling and educating, referring and communicating with other health care professionals, and coordinating care.   Waddell Lesches, DNP, AGNP-C Palliative Medicine   Please contact Palliative Medicine Team phone at [phone] for questions and concerns.   "

## 2024-06-06 NOTE — Progress Notes (Signed)
 [facility] Liaison Note  Follow up on new referral for hospice at home.  Patient has been taking oral dilaudid  with relief.  Plan is still discharge from [facility] once patient's wife, Hampton, has arranged 24/7 caregivers and after DME has been delivered.  Roxanne states her sister is helping move furniture today to accommodate needed equipment.  Hospital liaison team will continue to follow through final discharge plan.  Please call with any hospice related questions or concerns.  Thank you for allowing participation in Mr. Hartel's care.  Saddie HILARIO Na, RN Nurse Liaison [phone]

## 2024-06-07 ENCOUNTER — Ambulatory Visit

## 2024-06-07 ENCOUNTER — Ambulatory Visit: Admitting: Orthopedic Surgery

## 2024-06-07 ENCOUNTER — Other Ambulatory Visit: Payer: Self-pay

## 2024-06-07 LAB — GLUCOSE, CAPILLARY
Glucose-Capillary: 105 mg/dL — ABNORMAL HIGH (ref 70–99)
Glucose-Capillary: 124 mg/dL — ABNORMAL HIGH (ref 70–99)
Glucose-Capillary: 132 mg/dL — ABNORMAL HIGH (ref 70–99)
Glucose-Capillary: 158 mg/dL — ABNORMAL HIGH (ref 70–99)
Glucose-Capillary: 85 mg/dL (ref 70–99)

## 2024-06-07 LAB — ACID FAST CULTURE WITH REFLEXED SENSITIVITIES (MYCOBACTERIA): Acid Fast Culture: NEGATIVE

## 2024-06-07 MED ORDER — MORPHINE SULFATE (CONCENTRATE) 20 MG/ML PO SOLN
2.0000 mg | ORAL | 0 refills | Status: DC | PRN
  Filled 2024-06-07: qty 15, 37d supply, fill #0

## 2024-06-07 NOTE — Progress Notes (Signed)
"                                                                                                                                            °                                                   °  Palliative Care Progress Note, Assessment & Plan   Patient Name: [name] [name]       Date: 06/07/2024 DOB: 1961/03/13  Age: 64 y.o. MRN#: [id number] Attending Physician: Kandis Devaughn Sayres, MD Primary Care Physician: Liana Fish, NP Admit Date: 05/31/2024  Subjective: Patient is lying in bed on his right side, asleep.  However, he easily awakens to my presence.  He acknowledges my presence and is able to make his wishes known.  No family or friends present at bedside during my visit.  HPI: 64 y.o. 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  Patient developed sudden onset chest discomfort and shortness of breath.    Admitted and being treated for NSTEMI, possible new onset A. Fib, calciphylaxis with nonhealing ulcer, ESRD on PD   PMT transitioned to CMO 1/8--wife interested in hospice IPU  Following up today for symptom management and monitoring for comfort focused/end-of-life care.  Summary of counseling/coordination of care: Chart review completed prior to meeting patient including: -Vital signs: Blood pressure stable at 123/80, heart rate stable at 71, respiratory rate stable at 16, and patient saturating 98% on room air -Progress notes: -Imaging: -Orders: -Available advanced directive documents from current and  previous encounters:  After reviewing the patient's chart and assessing the patient at bedside, I spoke with patient in regards to symptom management and goals of care.   ***  Therapeutic silence and active listening provided for *** to share *** thoughts and emotions regarding current medical situation.  Emotional support provided.  Physical Exam           Recommendations:   ***   I personally spent a total of *** minutes in the care of the patient today including {Time Based Coding:210964241}.   Lamarr L. Arvid, DNP, FNP-BC Palliative Medicine Team   "

## 2024-06-07 NOTE — TOC Transition Note (Signed)
 Transition of Care [facility]) - Discharge Note   Patient Details  Name: [name] [name] MRN: [id number] Date of Birth: 1961/04/02  Transition of Care [facility]) CM/SW Contact:  Daved JONETTA Hamilton, RN Phone Number: 06/07/2024, 3:02 PM   Clinical Narrative:     Patient will DC to: Home with Hospice Anticipated DC date: 06/07/2024 Family notified: Hampton Bolder Transport by: Zona  Per MD patient ready for DC to Home with Hospice. RN, and patient's family notified of DC. DC packet on chart. Ambulance transport requested for patient.   TOC signing off.   Final next level of care: Home w Hospice Care Barriers to Discharge: Barriers Resolved   Patient Goals and CMS Choice            Discharge Placement                  Name of family member notified: Demond Shallenberger Patient and family notified of of transfer: 06/07/24  Discharge Plan and Services Additional resources added to the After Visit Summary for       Post Acute Care Choice: Resumption of Svcs/PTA Provider                    HH Arranged: RN [facility] Agency: [facility] Date [facility] Agency Contacted: 06/02/24   Representative spoke with at [facility] Agency: Darleene  Social Drivers of Health (SDOH) Interventions SDOH Screenings   Food Insecurity: No Food Insecurity (06/03/2024)  Housing: Low Risk (06/03/2024)  Transportation Needs: No Transportation Needs (06/03/2024)  Utilities: Not At Risk (06/03/2024)  Depression (PHQ2-9): Low Risk (01/22/2023)  Financial Resource Strain: Low Risk  (10/30/2023)   [facility] from [facility]  Physical Activity: Unknown (09/09/2021)   Received from [facility]  Social Connections: Unknown (06/03/2024)  Stress: No Stress Concern Present (02/23/2023)  Tobacco Use: High Risk (05/31/2024)  Health Literacy: Adequate Health Literacy (02/23/2023)     Readmission Risk Interventions    06/02/2024   10:24 AM  Readmission Risk Prevention Plan  Transportation Screening Complete  PCP  or Specialist Appt within 3-5 Days Complete  HRI or Home Care Consult Complete  Social Work Consult for Recovery Care Planning/Counseling Complete  Palliative Care Screening Complete  Medication Review Ocean[employer]) Complete

## 2024-06-07 NOTE — Progress Notes (Signed)
 Nutrition Brief Note  Chart reviewed. Pt now transitioning to comfort care. Per MD notes, plan to discharge home with hospice today.  No further nutrition interventions planned at this time.  Please re-consult as needed.   Margery ORN, RD, LDN, CDCES Registered Dietitian III Certified Diabetes Care and Education Specialist If unable to reach this RD, please use RD Inpatient group chat on secure chat between hours of 8am-4 pm daily

## 2024-06-07 NOTE — Discharge Summary (Signed)
 [name] [name] FMW:[id number] DOB: [birth year] DOA: 05/31/2024  PCP: Liana Fish, NP  Admit date: 05/31/2024 Discharge date: 06/07/2024  Time spent: 35 minutes  Recommendations for Outpatient Follow-up:  Establishing with hospice     Discharge Diagnoses:  Principal Problem:   NSTEMI (non-ST elevated myocardial infarction) [facility] Active Problems:   CAD with history of CABG x 4   Possible New onset atrial fibrillation (HCC)   ESRD on peritoneal dialysis (HCC)   Calciphylaxis with multiple nonhealing ulcer, with fat layer exposed (HCC)   Discitis of cervical region   Long term (current) use of antibiotics   Essential hypertension   Chronic HFrEF (heart failure with reduced ejection fraction) (HCC)   COPD (chronic obstructive pulmonary disease) (HCC)   Depression with anxiety   Chronic pain syndrome   Type 2 diabetes mellitus with diabetic polyneuropathy, with long-term current use of insulin  (HCC)   Atypical atrial flutter (HCC)   Protein-calorie malnutrition, severe   Discharge Condition: stable  Diet recommendation: ad lib  Filed Weights   05/31/24 1812 06/02/24 0451 06/03/24 0500  Weight: 68.9 kg 68.9 kg 71.8 kg    History of present illness:  [name] [name] is a 64 y.o. 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 His wife described an episode of worsening shortness of breath for which patient hunched over and was rocking back and forth and hurting all over.  Has had prior similar episodes over  the past several weeks but this one was more intense.   History of a nonischemic stress test November 2024 With EMS patient was in A-fib.  Shortness of breath was resolved by arrival.  Hospital Course:   * NSTEMI (non-ST elevated myocardial infarction) (HCC) CAD with history of four-vessel CABG Patient with episode of shortness of breath probable chest discomfort, nonspecific EKG, troponin peaked at 689, can be due to demand ischemia.  Not a candidate for cardiac cath. Received aspirin  in the ED Completed 48 hours of heparin  Continue aspirin , carvedilol , nitroglycerin  sublingual as needed chest pain, Zetia  and Repatha  Cardiology is on board,  Echocardiogram with for the worsening of EF not to 20 to 25%, global hypokinesis. - Patient is now comfort care and will be going home with hospice today   Possible New onset atrial fibrillation (HCC) EKG with concern of new onset atrial fibrillation/flutter, CHA2DS2-VASc at least 5 Now comfort care   Calciphylaxis with multiple nonhealing ulcer, with fat layer exposed (HCC) Patient with mild multiple wounds, followed at wound care center Continue pain control Continue wound care   ESRD on peritoneal dialysis [facility]) Nephrology consulted for continuation of dialysis- -PD dialysis is currently being held as family decided to go with comfort care. Patient will be going home with hospice today   Discitis of cervical region On current long-term antibiotics for discitis treatment  Continue ceftriaxone , daptomycin  and fluconazole  via PICC line - Completed a course of antibiotic now   Chronic HFrEF (heart failure with reduced ejection fraction) (HCC) Clinically euvolemic, repeat echocardiogram with further decrease of EF to 20 to 25%. Continue GDMT   Essential  hypertension Home amlodipine  was switched with Imdur  and hydralazine  due to further decrease in EF. How held   COPD (chronic obstructive pulmonary disease) (HCC) Not acutely  exacerbated Continue home inhalers with DuoNebs as needed   Depression with anxiety Continue home alprazolam  and nortriptyline    Type 2 diabetes mellitus with diabetic polyneuropathy, with long-term current use of insulin  (HCC) Can d/c insulin  at d/c   Chronic pain syndrome Continue as needed oxycodone  Oral morphine  added    Procedures: hemodialysis   Consultations: Cardiology, nephrology, palliative  Discharge Exam: Vitals:   06/07/24 0404 06/07/24 0944  BP: 137/82 123/80  Pulse: 77 92  Resp: 20 16  Temp: 98.2 F (36.8 C)   SpO2: 96% 97%    General: cachectic, wounds on extremities Cardiovascular: irreg irreg Respiratory: normal wob  Discharge Instructions   Discharge Instructions     Discharge wound care:   Complete by: As directed    As before admission   Increase activity slowly   Complete by: As directed       Allergies as of 06/07/2024       Reactions   Iodinated Contrast Media Other (See Comments)   Kidney disease  Kidney disease  Kidney disease  Kidney disease   Other Other (See Comments)   Pain  With joint stiffness    Statins Other (See Comments)   Pain  With joint stiffness  Pain  With joint stiffness Other Reaction(s): Joint Pain   Pregabalin Other (See Comments)   Generalized aches and pains Generalized aches and pains Generalized aches and pains Generalized aches and pains Generalized aches and pains   Sacubitril-valsartan Other (See Comments)   Hyperkalemia Hyperkalemia Hyperkalemia  Hyperkalemia   Atorvastatin Other (See Comments)   Muscle aches Muscle aches   Pravastatin Other (See Comments)   Muscle aches Muscle aches   Rosuvastatin Other (See Comments)   Muscle Aches Other reaction(s): JOINT PAIN Other reaction(s): JOINT PAIN Muscle Aches        Medication List     STOP taking these medications    amLODipine  10 MG tablet Commonly known as: NORVASC    aspirin  EC 81 MG tablet   B-D ULTRAFINE III SHORT PEN  31G X 8 MM Misc Generic drug: Insulin  Pen Needle   cefTRIAXone  2 g injection Commonly known as: ROCEPHIN    cholecalciferol  25 MCG (1000 UNIT) tablet Commonly known as: VITAMIN D3   cloNIDine  0.1 MG tablet Commonly known as: Catapres    DAPTOmycin  500 MG injection Commonly known as: CUBICIN    ezetimibe  10 MG tablet Commonly known as: ZETIA    fluconazole  200 MG tablet Commonly known as: DIFLUCAN    gentamicin  cream 0.1 % Commonly known as: GARAMYCIN    HumaLOG  Mix 75/25 KwikPen (75-25) 100 UNIT/ML KwikPen Generic drug: Insulin  Lispro Prot & Lispro   hydrALAZINE  100 MG tablet Commonly known as: APRESOLINE    insulin  lispro 100 UNIT/ML KwikPen Commonly known as: HUMALOG    isosorbide  mononitrate 30 MG 24 hr tablet Commonly known as: IMDUR    nitroGLYCERIN  0.4 MG SL tablet Commonly known as: NITROSTAT    Repatha  SureClick 140 MG/ML Soaj Generic drug: Evolocumab    Toujeo  Max SoloStar 300 UNIT/ML Solostar Pen Generic drug: insulin  glargine (2 Unit Dial )       TAKE these medications    albuterol  108 (90 Base) MCG/ACT inhaler Commonly known as: VENTOLIN  HFA Inhale 1-2 puffs into the lungs every 6 (six) hours as needed for wheezing or shortness of breath.   ALPRAZolam  0.5 MG tablet Commonly known as: XANAX  Take  1 tablet (0.5 mg total) by mouth 2 (two) times daily as needed for anxiety.   bisacodyl  5 MG EC tablet Commonly known as: DULCOLAX Take 10 mg by mouth 2 (two) times daily as needed for moderate constipation.   carvedilol  3.125 MG tablet Commonly known as: COREG  Take 1 tablet (3.125 mg total) by mouth 2 (two) times daily with a meal.   morphine  20 MG/5ML solution Take 0.5 mLs (2 mg total) by mouth every 4 (four) hours as needed for pain.   nortriptyline  50 MG capsule Commonly known as: PAMELOR  Take 50 mg by mouth at bedtime.   oxyCODONE  5 MG immediate release tablet Commonly known as: Oxy IR/ROXICODONE  Take 1 tablet (5 mg total) by mouth 2 (two) times  daily as needed for severe pain (pain score 7-10).   pantoprazole  40 MG tablet Commonly known as: PROTONIX  Take 1 tablet (40 mg total) by mouth 2 (two) times daily before a meal.   torsemide  20 MG tablet Commonly known as: DEMADEX  Take 1 tablet (20 mg total) by mouth 2 (two) times daily. What changed:  how much to take additional instructions               Discharge Care Instructions  (From admission, onward)           Start     Ordered   06/07/24 0000  Discharge wound care:       Comments: As before admission   06/07/24 1400           Allergies[1]    The results of significant diagnostics from this hospitalization (including imaging, microbiology, ancillary and laboratory) are listed below for reference.    Significant Diagnostic Studies: ECHOCARDIOGRAM COMPLETE Result Date: 06/01/2024    ECHOCARDIOGRAM REPORT   Patient Name:   [name] [name] Date of Exam: 06/01/2024 Medical Rec #:  [id number]       Height:       73.0 in Accession #:    [id number]      Weight:       152.0 lb Date of Birth:  1960/11/15      BSA:          1.915 m Patient Age:    63 years        BP:           137/78 mmHg Patient Gender: M               HR:           55 bpm. Exam Location:  [facility] Procedure: 2D Echo, Cardiac Doppler and Color Doppler (Both Spectral and Color            Flow Doppler were utilized during procedure). Indications:     Dyspnea R06.00  History:         Patient has prior history of Echocardiogram examinations, most                  recent 03/25/2023. Signs/Symptoms:Dyspnea.  Sonographer:     Rosina Dunk Referring Phys:  [id number] DELAYNE LULLA SOLIAN Diagnosing Phys: Lonni Hanson MD IMPRESSIONS  1. Left ventricular ejection fraction, by estimation, is 20 to 25%. The left ventricle has severely decreased function. The left ventricle demonstrates global hypokinesis. The left ventricular internal cavity size was mildly dilated. Left ventricular diastolic parameters are consistent  with Grade II diastolic dysfunction (pseudonormalization). Elevated left atrial pressure.  2. Right ventricular systolic function is severely reduced. The right ventricular size is normal.  3.  Left atrial size was mildly dilated.  4. The mitral valve is degenerative. Mild mitral valve regurgitation. No evidence of mitral stenosis. Moderate to severe mitral annular calcification.  5. Tricuspid valve regurgitation is mild to moderate.  6. The aortic valve is tricuspid. There is moderate calcification of the aortic valve. There is severe thickening of the aortic valve. Aortic valve regurgitation is not visualized. Probably severe low-flow/low-gradient aortic valve stenosis (mean gradient 14 mmHg, AVA 0.91 cm^2, DI 0.24).  7. The inferior vena cava is dilated in size with <50% respiratory variability, suggesting right atrial pressure of 15 mmHg.  8. Cannot exclude a small PFO. FINDINGS  Left Ventricle: Left ventricular ejection fraction, by estimation, is 20 to 25%. The left ventricle has severely decreased function. The left ventricle demonstrates global hypokinesis. The left ventricular internal cavity size was mildly dilated. There is no left ventricular hypertrophy. Left ventricular diastolic parameters are consistent with Grade II diastolic dysfunction (pseudonormalization). Elevated left atrial pressure. Right Ventricle: The right ventricular size is normal. No increase in right ventricular wall thickness. Right ventricular systolic function is severely reduced. Left Atrium: Left atrial size was mildly dilated. Right Atrium: Right atrial size was normal in size. Pericardium: There is no evidence of pericardial effusion. Mitral Valve: The mitral valve is degenerative in appearance. There is mild thickening of the mitral valve leaflet(s). Moderate to severe mitral annular calcification. Mild mitral valve regurgitation. No evidence of mitral valve stenosis. MV peak gradient, 6.5 mmHg. The mean mitral valve gradient  is 4.0 mmHg. Tricuspid Valve: The tricuspid valve is grossly normal. Tricuspid valve regurgitation is mild to moderate. Aortic Valve: The aortic valve is tricuspid. There is moderate calcification of the aortic valve. There is severe thickening of the aortic valve. Aortic valve regurgitation is not visualized. Severe aortic stenosis is present. Aortic valve mean gradient measures 14.0 mmHg. Aortic valve peak gradient measures 27.5 mmHg. Aortic valve area, by VTI measures 0.91 cm. Pulmonic Valve: The pulmonic valve was normal in structure. Pulmonic valve regurgitation is not visualized. No evidence of pulmonic stenosis. Aorta: The aortic root and ascending aorta are structurally normal, with no evidence of dilitation. Pulmonary Artery: The pulmonary artery is of normal size. Venous: The inferior vena cava is dilated in size with less than 50% respiratory variability, suggesting right atrial pressure of 15 mmHg. IAS/Shunts: Cannot exclude a small PFO.  LEFT VENTRICLE PLAX 2D LVIDd:         5.70 cm      Diastology LVIDs:         4.80 cm      LV e' medial:    5.24 cm/s LV PW:         1.00 cm      LV E/e' medial:  23.3 LV IVS:        1.00 cm      LV e' lateral:   5.63 cm/s LVOT diam:     2.20 cm      LV E/e' lateral: 21.7 LV SV:         50 LV SV Index:   26 LVOT Area:     3.80 cm  LV Volumes (MOD) LV vol d, MOD A2C: 168.0 ml LV vol d, MOD A4C: 155.0 ml LV vol s, MOD A2C: 153.0 ml LV vol s, MOD A4C: 130.0 ml LV SV MOD A2C:     15.0 ml LV SV MOD A4C:     155.0 ml LV SV MOD BP:      19.6  ml RIGHT VENTRICLE          IVC RV Basal diam:  4.00 cm  IVC diam: 2.30 cm RV Mid diam:    3.30 cm TAPSE (M-mode): 0.5 cm LEFT ATRIUM             Index        RIGHT ATRIUM           Index LA diam:        3.90 cm 2.04 cm/m   RA Area:     15.60 cm LA Vol (A2C):   79.5 ml 41.52 ml/m  RA Volume:   37.50 ml  19.58 ml/m LA Vol (A4C):   71.4 ml 37.29 ml/m LA Biplane Vol: 76.8 ml 40.11 ml/m  AORTIC VALVE                     PULMONIC VALVE  AV Area (Vmax):    0.90 cm      PV Vmax:        0.80 m/s AV Area (Vmean):   0.86 cm      PV Vmean:       58.300 cm/s AV Area (VTI):     0.91 cm      PV VTI:         0.143 m AV Vmax:           262.00 cm/s   PV Peak grad:   2.6 mmHg AV Vmean:          173.000 cm/s  PV Mean grad:   1.0 mmHg AV VTI:            0.553 m       RVOT Peak grad: 1 mmHg AV Peak Grad:      27.5 mmHg AV Mean Grad:      14.0 mmHg LVOT Vmax:         61.80 cm/s LVOT Vmean:        39.300 cm/s LVOT VTI:          0.132 m LVOT/AV VTI ratio: 0.24  AORTA Ao Root diam: 3.00 cm Ao Asc diam:  3.00 cm MITRAL VALVE MV Area (PHT): 4.60 cm     SHUNTS MV Area VTI:   1.39 cm     Systemic VTI:  0.13 m MV Peak grad:  6.5 mmHg     Systemic Diam: 2.20 cm MV Mean grad:  4.0 mmHg     Pulmonic VTI:  0.087 m MV Vmax:       1.27 m/s MV Vmean:      91.4 cm/s MV Decel Time: 165 msec MV E velocity: 122.00 cm/s MV A velocity: 71.30 cm/s MV E/A ratio:  1.71 Lonni Hanson MD Electronically signed by Lonni Hanson MD Signature Date/Time: 06/01/2024/2:25:03 PM    Final    DG Chest 2 View Result Date: 05/31/2024 EXAM: 2 VIEW(S) XRAY OF THE CHEST 05/31/2024 06:28:00 PM COMPARISON: 04/08/2024 CLINICAL HISTORY: sob FINDINGS: LINES, TUBES AND DEVICES: Left PICC line in place with the tip in the SVC. LUNGS AND PLEURA: No focal pulmonary opacity. No pleural effusion. No pneumothorax. HEART AND MEDIASTINUM: Prior CABG. Aortic atherosclerosis. No acute abnormality of the cardiac and mediastinal silhouettes. BONES AND SOFT TISSUES: No acute osseous abnormality. IMPRESSION: 1. No active cardiopulmonary disease. Electronically signed by: Franky Crease MD 05/31/2024 07:15 PM EST RP Workstation: [id number]    Microbiology: No results found for this or any previous visit (from the past 240 hours).   Labs:  Basic Metabolic Panel: Recent Labs  Lab 05/31/24 1900 06/03/24 0359 06/05/24 0447  NA 129*  --  148*  K 3.7 3.3* 4.1  CL 87*  --  90*  CO2 23  --  17*  GLUCOSE 178*  --   126*  BUN 53*  --  66*  CREATININE 6.60*  --  7.40*  CALCIUM  9.0  --  10.1  MG 2.1 2.3  --   PHOS  --  5.6*  --    Liver Function Tests: Recent Labs  Lab 06/02/24 0412  AST 29  ALT 15  ALKPHOS 124  BILITOT 0.3  PROT 6.5  ALBUMIN  3.1*   No results for input(s): LIPASE, AMYLASE in the last 168 hours. No results for input(s): AMMONIA in the last 168 hours. CBC: Recent Labs  Lab 05/31/24 1816 06/01/24 0322 06/02/24 0412 06/03/24 0359  WBC 11.9* 11.2* 13.0* 12.3*  NEUTROABS  --   --  10.6*  --   HGB 13.1 11.6* 12.5* 11.6*  HCT 37.8* 33.7* 37.5* 33.0*  MCV 92.0 92.8 94.2 89.9  PLT 246 225 224 240   Cardiac Enzymes: Recent Labs  Lab 06/02/24 0412  CKTOTAL 286   BNP: BNP (last 3 results) Recent Labs    04/01/24 0443  BNP 1,856.5*    ProBNP (last 3 results) Recent Labs    05/31/24 1900  PROBNP >35,000.0*    CBG: Recent Labs  Lab 06/06/24 2017 06/07/24 0036 06/07/24 0402 06/07/24 0833 06/07/24 1147  GLUCAP 148* 85 132* 105* 158*       Signed:  Devaughn KATHEE Ban MD.  [facility] 06/07/2024, 2:02 PM     [1]  Allergies Allergen Reactions   Iodinated Contrast Media Other (See Comments)    Kidney disease  Kidney disease  Kidney disease  Kidney disease   Other Other (See Comments)    Pain  With joint stiffness     Statins Other (See Comments)    Pain  With joint stiffness   Pain  With joint stiffness  Other Reaction(s): Joint Pain   Pregabalin Other (See Comments)    Generalized aches and pains Generalized aches and pains Generalized aches and pains Generalized aches and pains Generalized aches and pains   Sacubitril-Valsartan Other (See Comments)    Hyperkalemia  Hyperkalemia Hyperkalemia  Hyperkalemia   Atorvastatin Other (See Comments)    Muscle aches Muscle aches   Pravastatin Other (See Comments)    Muscle aches Muscle aches   Rosuvastatin Other (See Comments)    Muscle Aches Other reaction(s): JOINT  PAIN Other reaction(s): JOINT PAIN Muscle Aches

## 2024-06-07 NOTE — Progress Notes (Signed)
 Patient discharging home with hospice. All discharge needs met. Patients wife, Hampton, called and educated on discharge instructions/meds. [facility] transporting patient, wife called and made aware that patient is leaving [employer].

## 2024-06-07 NOTE — Plan of Care (Signed)
  Problem: Education: Goal: Ability to describe self-care measures that may prevent or decrease complications (Diabetes Survival Skills Education) will improve Outcome: Progressing Goal: Individualized Educational Video(s) Outcome: Progressing   Problem: Coping: Goal: Ability to adjust to condition or change in health will improve Outcome: Progressing   Problem: Fluid Volume: Goal: Ability to maintain a balanced intake and output will improve Outcome: Progressing   Problem: Health Behavior/Discharge Planning: Goal: Ability to identify and utilize available resources and services will improve Outcome: Progressing Goal: Ability to manage health-related needs will improve Outcome: Progressing   Problem: Metabolic: Goal: Ability to maintain appropriate glucose levels will improve Outcome: Progressing   Problem: Nutritional: Goal: Maintenance of adequate nutrition will improve Outcome: Progressing Goal: Progress toward achieving an optimal weight will improve Outcome: Progressing   Problem: Skin Integrity: Goal: Risk for impaired skin integrity will decrease Outcome: Progressing   Problem: Tissue Perfusion: Goal: Adequacy of tissue perfusion will improve Outcome: Progressing   Problem: Education: Goal: Understanding of cardiac disease, CV risk reduction, and recovery process will improve Outcome: Progressing Goal: Individualized Educational Video(s) Outcome: Progressing   Problem: Activity: Goal: Ability to tolerate increased activity will improve Outcome: Progressing   Problem: Cardiac: Goal: Ability to achieve and maintain adequate cardiovascular perfusion will improve Outcome: Progressing   Problem: Health Behavior/Discharge Planning: Goal: Ability to safely manage health-related needs after discharge will improve Outcome: Progressing   Problem: Education: Goal: Knowledge of General Education information will improve Description: Including pain rating scale,  medication(s)/side effects and non-pharmacologic comfort measures Outcome: Progressing   Problem: Health Behavior/Discharge Planning: Goal: Ability to manage health-related needs will improve Outcome: Progressing   Problem: Clinical Measurements: Goal: Ability to maintain clinical measurements within normal limits will improve Outcome: Progressing Goal: Will remain free from infection Outcome: Progressing Goal: Diagnostic test results will improve Outcome: Progressing Goal: Respiratory complications will improve Outcome: Progressing Goal: Cardiovascular complication will be avoided Outcome: Progressing   Problem: Activity: Goal: Risk for activity intolerance will decrease Outcome: Progressing   Problem: Nutrition: Goal: Adequate nutrition will be maintained Outcome: Progressing   Problem: Coping: Goal: Level of anxiety will decrease Outcome: Progressing   Problem: Elimination: Goal: Will not experience complications related to bowel motility Outcome: Progressing Goal: Will not experience complications related to urinary retention Outcome: Progressing   Problem: Pain Managment: Goal: General experience of comfort will improve and/or be controlled Outcome: Progressing   Problem: Safety: Goal: Ability to remain free from injury will improve Outcome: Progressing   Problem: Skin Integrity: Goal: Risk for impaired skin integrity will decrease Outcome: Progressing

## 2024-06-07 NOTE — Progress Notes (Signed)
"   [facility]) HOSPITAL LIAISON NOTE     Follow up with patient and wife regarding discharging home with hospice.  DME has been ordered and delivery confirmed.  Wife states she has an aide coming this evening.  Please send signed and completed DNR home with patient/family if applicable.    Please provide prescriptions at discharge as needed to ensure ongoing symptom management.    [facility] information and contact numbers given to [employer].   Above information shared with CM and hospital medical care team.   Please call with any hospice related questions or concerns.   Thank you for the opportunity to participate in this patients care.   Inocente Jacobs RN BSN [facility] Liaison [phone] "

## 2024-06-08 ENCOUNTER — Ambulatory Visit: Admitting: Infectious Diseases

## 2024-06-08 ENCOUNTER — Other Ambulatory Visit (INDEPENDENT_AMBULATORY_CARE_PROVIDER_SITE_OTHER): Payer: Self-pay | Admitting: Vascular Surgery

## 2024-06-08 ENCOUNTER — Encounter: Admitting: Physician Assistant

## 2024-06-08 DIAGNOSIS — L98A399 Non-pressure chronic ulcer of unspecified hand with unspecified severity: Secondary | ICD-10-CM

## 2024-06-09 ENCOUNTER — Encounter (INDEPENDENT_AMBULATORY_CARE_PROVIDER_SITE_OTHER)

## 2024-06-09 ENCOUNTER — Ambulatory Visit (INDEPENDENT_AMBULATORY_CARE_PROVIDER_SITE_OTHER): Admitting: Nurse Practitioner

## 2024-06-09 ENCOUNTER — Telehealth: Payer: Self-pay | Admitting: Nurse Practitioner

## 2024-06-09 ENCOUNTER — Other Ambulatory Visit (INDEPENDENT_AMBULATORY_CARE_PROVIDER_SITE_OTHER)

## 2024-06-09 NOTE — Telephone Encounter (Signed)
 Called wife to schedule hospital follow up. She stated patient is now under hospice care at home and to cancel all upcoming appointments-Toni

## 2024-06-23 ENCOUNTER — Encounter: Admitting: Physician Assistant

## 2024-06-27 DEATH — deceased

## 2024-06-30 ENCOUNTER — Ambulatory Visit: Admitting: Podiatry

## 2024-07-01 LAB — ACID FAST CULTURE WITH REFLEXED SENSITIVITIES (MYCOBACTERIA): Acid Fast Culture: NEGATIVE

## 2024-07-27 ENCOUNTER — Ambulatory Visit: Admitting: Nurse Practitioner

## 2024-11-02 ENCOUNTER — Ambulatory Visit (INDEPENDENT_AMBULATORY_CARE_PROVIDER_SITE_OTHER): Admitting: Vascular Surgery

## 2024-11-02 ENCOUNTER — Encounter (INDEPENDENT_AMBULATORY_CARE_PROVIDER_SITE_OTHER)

## 2025-01-27 ENCOUNTER — Ambulatory Visit: Admitting: Nurse Practitioner
